# Patient Record
Sex: Male | Born: 1940 | Race: White | Hispanic: No | Marital: Married | Smoking: Never smoker
Health system: Southern US, Community
[De-identification: ages and names within clinical notes are randomized; demographics above are authoritative.]

## PROBLEM LIST (undated history)

## (undated) DIAGNOSIS — I4891 Unspecified atrial fibrillation: Secondary | ICD-10-CM

## (undated) DIAGNOSIS — I472 Ventricular tachycardia, unspecified: Secondary | ICD-10-CM

## (undated) DIAGNOSIS — E039 Hypothyroidism, unspecified: Secondary | ICD-10-CM

## (undated) DIAGNOSIS — I080 Rheumatic disorders of both mitral and aortic valves: Secondary | ICD-10-CM

## (undated) DIAGNOSIS — I428 Other cardiomyopathies: Secondary | ICD-10-CM

## (undated) DIAGNOSIS — Z95810 Presence of automatic (implantable) cardiac defibrillator: Secondary | ICD-10-CM

## (undated) DIAGNOSIS — G061 Intraspinal abscess and granuloma: Secondary | ICD-10-CM

## (undated) DIAGNOSIS — Z952 Presence of prosthetic heart valve: Secondary | ICD-10-CM

## (undated) DIAGNOSIS — I499 Cardiac arrhythmia, unspecified: Secondary | ICD-10-CM

## (undated) DIAGNOSIS — G4730 Sleep apnea, unspecified: Secondary | ICD-10-CM

## (undated) DIAGNOSIS — I509 Heart failure, unspecified: Secondary | ICD-10-CM

## (undated) DIAGNOSIS — F528 Other sexual dysfunction not due to a substance or known physiological condition: Secondary | ICD-10-CM

## (undated) DIAGNOSIS — R55 Syncope and collapse: Secondary | ICD-10-CM

## (undated) HISTORY — DX: Syncope and collapse: R55

## (undated) HISTORY — DX: Other sexual dysfunction not due to a substance or known physiological condition: F52.8

## (undated) HISTORY — DX: Sleep apnea, unspecified: G47.30

## (undated) HISTORY — PX: THYROIDECTOMY: SHX17

## (undated) HISTORY — PX: MITRAL VALVE REPLACEMENT: SHX147

## (undated) HISTORY — PX: OTHER SURGICAL HISTORY: SHX169

## (undated) HISTORY — DX: Intraspinal abscess and granuloma: G06.1

## (undated) HISTORY — DX: Rheumatic disorders of both mitral and aortic valves: I08.0

## (undated) HISTORY — PX: TONSILLECTOMY: SUR1361

## (undated) HISTORY — DX: Unspecified atrial fibrillation: I48.91

## (undated) HISTORY — PX: CARDIAC VALVE REPLACEMENT: SHX585

## (undated) SURGERY — Surgical Case
Anesthesia: *Unknown

---

## 1997-07-26 HISTORY — PX: OTHER SURGICAL HISTORY: SHX169

## 1998-03-28 ENCOUNTER — Ambulatory Visit (HOSPITAL_COMMUNITY): Admission: RE | Admit: 1998-03-28 | Discharge: 1998-03-28 | Payer: Self-pay | Admitting: Internal Medicine

## 1998-03-30 ENCOUNTER — Encounter: Payer: Self-pay | Admitting: Internal Medicine

## 1998-03-30 ENCOUNTER — Inpatient Hospital Stay (HOSPITAL_COMMUNITY): Admission: AD | Admit: 1998-03-30 | Discharge: 1998-04-10 | Payer: Self-pay | Admitting: Internal Medicine

## 1998-04-01 ENCOUNTER — Encounter: Payer: Self-pay | Admitting: Internal Medicine

## 1998-04-02 ENCOUNTER — Encounter: Payer: Self-pay | Admitting: Internal Medicine

## 1998-04-09 ENCOUNTER — Encounter: Payer: Self-pay | Admitting: Internal Medicine

## 1998-06-02 ENCOUNTER — Encounter: Admission: RE | Admit: 1998-06-02 | Discharge: 1998-06-02 | Payer: Self-pay | Admitting: Infectious Diseases

## 1998-06-02 ENCOUNTER — Ambulatory Visit (HOSPITAL_COMMUNITY): Admission: RE | Admit: 1998-06-02 | Discharge: 1998-06-02 | Payer: Self-pay | Admitting: Infectious Diseases

## 1998-06-25 ENCOUNTER — Encounter: Admission: RE | Admit: 1998-06-25 | Discharge: 1998-06-25 | Payer: Self-pay | Admitting: Infectious Diseases

## 1998-07-26 HISTORY — PX: VALVE REPLACEMENT: SUR13

## 1998-07-28 ENCOUNTER — Encounter: Payer: Self-pay | Admitting: Interventional Cardiology

## 1998-07-28 ENCOUNTER — Inpatient Hospital Stay (HOSPITAL_COMMUNITY): Admission: RE | Admit: 1998-07-28 | Discharge: 1998-08-05 | Payer: Self-pay | Admitting: Interventional Cardiology

## 1998-07-29 ENCOUNTER — Encounter: Payer: Self-pay | Admitting: Interventional Cardiology

## 1998-07-31 ENCOUNTER — Encounter: Payer: Self-pay | Admitting: Cardiothoracic Surgery

## 1998-08-01 ENCOUNTER — Encounter: Payer: Self-pay | Admitting: Cardiothoracic Surgery

## 1998-08-03 ENCOUNTER — Encounter: Payer: Self-pay | Admitting: Cardiothoracic Surgery

## 1998-09-05 ENCOUNTER — Encounter: Payer: Self-pay | Admitting: Internal Medicine

## 2002-04-23 ENCOUNTER — Encounter: Admission: RE | Admit: 2002-04-23 | Discharge: 2002-04-23 | Payer: Self-pay | Admitting: Interventional Cardiology

## 2002-04-23 ENCOUNTER — Encounter: Payer: Self-pay | Admitting: Interventional Cardiology

## 2002-04-24 ENCOUNTER — Ambulatory Visit (HOSPITAL_COMMUNITY): Admission: RE | Admit: 2002-04-24 | Discharge: 2002-04-25 | Payer: Self-pay | Admitting: Interventional Cardiology

## 2002-04-24 HISTORY — PX: CARDIAC CATHETERIZATION: SHX172

## 2004-02-28 ENCOUNTER — Encounter: Admission: RE | Admit: 2004-02-28 | Discharge: 2004-02-28 | Payer: Self-pay | Admitting: Internal Medicine

## 2005-05-23 ENCOUNTER — Inpatient Hospital Stay (HOSPITAL_COMMUNITY): Admission: EM | Admit: 2005-05-23 | Discharge: 2005-05-24 | Payer: Self-pay | Admitting: Emergency Medicine

## 2005-05-24 ENCOUNTER — Encounter (INDEPENDENT_AMBULATORY_CARE_PROVIDER_SITE_OTHER): Payer: Self-pay | Admitting: *Deleted

## 2008-05-15 ENCOUNTER — Encounter: Admission: RE | Admit: 2008-05-15 | Discharge: 2008-05-15 | Payer: Self-pay | Admitting: Cardiology

## 2008-05-21 ENCOUNTER — Inpatient Hospital Stay (HOSPITAL_COMMUNITY): Admission: RE | Admit: 2008-05-21 | Discharge: 2008-05-22 | Payer: Self-pay | Admitting: Cardiology

## 2008-11-23 HISTORY — PX: INSERT / REPLACE / REMOVE PACEMAKER: SUR710

## 2008-11-26 ENCOUNTER — Encounter: Payer: Self-pay | Admitting: Internal Medicine

## 2008-11-27 ENCOUNTER — Ambulatory Visit: Payer: Self-pay | Admitting: Cardiovascular Disease

## 2008-11-27 ENCOUNTER — Ambulatory Visit: Payer: Self-pay | Admitting: Internal Medicine

## 2008-11-27 DIAGNOSIS — I482 Chronic atrial fibrillation, unspecified: Secondary | ICD-10-CM | POA: Insufficient documentation

## 2008-11-27 DIAGNOSIS — H9190 Unspecified hearing loss, unspecified ear: Secondary | ICD-10-CM | POA: Insufficient documentation

## 2008-11-27 DIAGNOSIS — F528 Other sexual dysfunction not due to a substance or known physiological condition: Secondary | ICD-10-CM

## 2008-11-27 DIAGNOSIS — R55 Syncope and collapse: Secondary | ICD-10-CM | POA: Insufficient documentation

## 2008-11-27 DIAGNOSIS — G061 Intraspinal abscess and granuloma: Secondary | ICD-10-CM | POA: Insufficient documentation

## 2008-11-27 DIAGNOSIS — G4730 Sleep apnea, unspecified: Secondary | ICD-10-CM | POA: Insufficient documentation

## 2008-11-27 DIAGNOSIS — I4891 Unspecified atrial fibrillation: Secondary | ICD-10-CM | POA: Insufficient documentation

## 2008-11-28 ENCOUNTER — Ambulatory Visit: Payer: Self-pay | Admitting: Internal Medicine

## 2008-11-28 ENCOUNTER — Ambulatory Visit (HOSPITAL_COMMUNITY): Admission: RE | Admit: 2008-11-28 | Discharge: 2008-11-29 | Payer: Self-pay | Admitting: Internal Medicine

## 2008-11-29 ENCOUNTER — Encounter: Payer: Self-pay | Admitting: Internal Medicine

## 2008-12-10 ENCOUNTER — Encounter: Payer: Self-pay | Admitting: Internal Medicine

## 2008-12-11 ENCOUNTER — Ambulatory Visit: Payer: Self-pay

## 2009-03-05 ENCOUNTER — Ambulatory Visit: Payer: Self-pay | Admitting: Internal Medicine

## 2009-04-15 ENCOUNTER — Ambulatory Visit: Payer: Self-pay | Admitting: Internal Medicine

## 2009-04-15 DIAGNOSIS — I5023 Acute on chronic systolic (congestive) heart failure: Secondary | ICD-10-CM | POA: Insufficient documentation

## 2009-04-15 DIAGNOSIS — I5022 Chronic systolic (congestive) heart failure: Secondary | ICD-10-CM

## 2009-04-15 LAB — CONVERTED CEMR LAB
BUN: 21 mg/dL
CO2: 32 meq/L
Calcium: 8.9 mg/dL
Chloride: 106 meq/L
Creatinine, Ser: 1.4 mg/dL
GFR calc non Af Amer: 53.82 mL/min
Glucose, Bld: 89 mg/dL
Potassium: 5 meq/L
Sodium: 141 meq/L

## 2009-05-06 ENCOUNTER — Encounter: Payer: Self-pay | Admitting: Internal Medicine

## 2009-05-12 ENCOUNTER — Encounter: Payer: Self-pay | Admitting: Internal Medicine

## 2009-06-02 ENCOUNTER — Encounter: Payer: Self-pay | Admitting: Internal Medicine

## 2009-06-02 ENCOUNTER — Ambulatory Visit: Payer: Self-pay | Admitting: Internal Medicine

## 2009-06-02 DIAGNOSIS — Z95810 Presence of automatic (implantable) cardiac defibrillator: Secondary | ICD-10-CM | POA: Insufficient documentation

## 2009-08-25 ENCOUNTER — Ambulatory Visit: Payer: Self-pay | Admitting: Internal Medicine

## 2010-01-23 ENCOUNTER — Ambulatory Visit: Payer: Self-pay | Admitting: Internal Medicine

## 2010-06-10 ENCOUNTER — Telehealth: Payer: Self-pay | Admitting: Internal Medicine

## 2010-06-11 ENCOUNTER — Ambulatory Visit: Payer: Self-pay | Admitting: Internal Medicine

## 2010-06-25 ENCOUNTER — Ambulatory Visit: Payer: Self-pay | Admitting: Internal Medicine

## 2010-07-06 ENCOUNTER — Ambulatory Visit: Payer: Self-pay | Admitting: Internal Medicine

## 2010-08-06 ENCOUNTER — Ambulatory Visit
Admission: RE | Admit: 2010-08-06 | Discharge: 2010-08-06 | Payer: Self-pay | Source: Home / Self Care | Attending: Internal Medicine | Admitting: Internal Medicine

## 2010-08-23 LAB — CONVERTED CEMR LAB
BUN: 21 mg/dL (ref 6–23)
Basophils Absolute: 0 10*3/uL (ref 0.0–0.1)
Basophils Relative: 0.1 % (ref 0.0–3.0)
CO2: 31 meq/L (ref 19–32)
Calcium: 8.8 mg/dL (ref 8.4–10.5)
Chloride: 106 meq/L (ref 96–112)
Creatinine, Ser: 1.2 mg/dL (ref 0.4–1.5)
Eosinophils Absolute: 0.2 10*3/uL (ref 0.0–0.7)
Eosinophils Relative: 2.2 % (ref 0.0–5.0)
GFR calc non Af Amer: 64.37 mL/min (ref >60)
Glucose, Bld: 84 mg/dL (ref 70–99)
HCT: 44 % (ref 39.0–52.0)
Hemoglobin: 15.5 g/dL (ref 13.0–17.0)
Lymphocytes Relative: 16.1 % (ref 12.0–46.0)
Lymphs Abs: 1.1 10*3/uL (ref 0.7–4.0)
MCHC: 35.2 g/dL (ref 30.0–36.0)
MCV: 89.5 fL (ref 78.0–100.0)
Monocytes Absolute: 0.8 10*3/uL (ref 0.1–1.0)
Monocytes Relative: 11.3 % (ref 3.0–12.0)
Neutro Abs: 5 10*3/uL (ref 1.4–7.7)
Neutrophils Relative %: 70.3 % (ref 43.0–77.0)
Platelets: 130 10*3/uL — ABNORMAL LOW (ref 150.0–400.0)
Potassium: 4.5 meq/L (ref 3.5–5.1)
Prothrombin Time: 34.4 s — ABNORMAL HIGH (ref 10.9–13.3)
RBC: 4.92 M/uL (ref 4.22–5.81)
RDW: 13.9 % (ref 11.5–14.6)
Sodium: 140 meq/L (ref 135–145)
WBC: 7.1 10*3/uL (ref 4.5–10.5)
aPTT: 40.6 s — ABNORMAL HIGH (ref 21.7–28.8)

## 2010-08-27 NOTE — Procedures (Signed)
Summary: [facility] Device [facility]   Current Medications (verified): 1)  Fish Oil 1000 Mg Caps (Omega-3 Fatty Acids) .... Take 1 By Mouth Once Daily 2)  Folbic 2.5-25-2 Mg Tabs (Fa-Pyridoxine-Cyancobalamin) .Marland Kitchen.. 1 Tab Once Daily 3)  Jantoven 5 Mg Tabs (Warfarin Sodium) .... As Directed By [facility] 4)  Amoxicillin 500 Mg Tabs (Amoxicillin) .... 4 Tablerts Prior To Dental Appt As Needed 5)  Multivitamins  Caps (Multiple Vitamin) .... Take 1 By Mouth Once Occasionally 6)  Amiodarone Hcl 200 Mg Tabs (Amiodarone Hcl) .... Take One Tablet By Mouth Once Daily. 7)  Lisinopril 20 Mg Tabs (Lisinopril) .... Take One Tablet By Mouth Daily 8)  Levothyroxine Sodium 75 Mcg Tabs (Levothyroxine Sodium) .... Take One Tablet By Mouth Once Daily.  Allergies (verified): No Known Drug Allergies   ICD Specifications Following MD:  Lewayne Bunting, MD     Referring MD:  Garnette Scheuermann, MD ICD Vendor:  Medtronic     ICD Model Number:  [phone]     ICD Serial Number:  [id number] ICD DOI:  11/28/2008     ICD Implanting MD:  Lewayne Bunting, MD  Lead 1:    Location: RA     DOI: 11/28/2008     Model #: 3762     Serial #: [id number]     Status: active Lead 2:    Location: RV     DOI: 11/28/2008     Model #: 7371     Serial #: [id number]     Status: active Lead 3:    Location: LV     DOI: 11/28/2008     Model #: 6270     Serial #: [id number]     Status: active  Indications::  NICM; VT   ICD Follow Up ICD Dependent:  No       ICD Device Measurements Configuration: BIPOLAR  Episodes Coumadin:  Yes  Brady Parameters Mode DDDR     Lower Rate Limit:  60     Upper Rate Limit 130 PAV 180     Sensed AV Delay:  150 Rate Response Parameters:  LV-RV64ms  Tachy Zones VF:  222     VT:  OFF     VT1:  171

## 2010-08-27 NOTE — Assessment & Plan Note (Signed)
Summary: per check out/sf   Visit Type:  Follow-up Primary Provider:  Dr Austin [name] cardiology   History of Present Illness: Austin [name] retuirns today for [name].  He is a pleasant 70 yo man with a h/o VT and syncope as well as symptomatic bradycardia and atrial fibrillation.  He is s/p upgrade to a BiV ICD over a year ago. At that time he had removal of his pacing lead and attempted removal of a star-fix LV lead with attempts to remove the Star Fix lead terminated because of concerns of causing serious bleeding.  An ICD and LV lead were then placed and he has been stable.  He has had VT with shocks and he has been on gradually reduced doses of amiodarone.   He denies c/p or sob and his activity is back to his baseline.     He has not had any intercurrent ICD therapies.  The patient has had mild swelling over his ICD incison which began about 4 weeks ago.  No fever, chills, night sweats, tenderness or drainage. When I saw him two weeks ago I recommended a period of watchful waiting and he has been stable. No change in the size of his swelling.  Current Medications (verified): 1)  Fish Oil 1000 Mg Caps (Omega-3 Fatty Acids) .... Take 1 By Mouth Once Daily 2)  Folbic 2.5-25-2 Mg Tabs (Fa-Pyridoxine-Cyancobalamin) .Marland Kitchen.. 1 Tab Once Daily 3)  Jantoven 5 Mg Tabs (Warfarin Sodium) .... As Directed By [facility] 4)  Amoxicillin 500 Mg Tabs (Amoxicillin) .... 4 Tablerts Prior To Dental Appt As Needed 5)  Multivitamins  Caps (Multiple Vitamin) .... Take 1 By Mouth Once Daily 6)  Amiodarone Hcl 200 Mg Tabs (Amiodarone Hcl) .... Take One Tablet By Mouth Once Daily. 7)  Lisinopril 20 Mg Tabs (Lisinopril) .... Take One Tablet By Mouth Daily 8)  Levothyroxine Sodium 75 Mcg Tabs (Levothyroxine Sodium) .... Take One Tablet By Mouth Once Daily.  Allergies (verified): No Known Drug Allergies  Past History:  Past Medical History: Last updated: 11/27/2008 LEFT VENTRICULAR FUNCTION, DECREASED  (ICD-429.2) SLEEP APNEA (ICD-780.57) HYPERCHOLESTEROLEMIA (ICD-272.0) ERECTILE DYSFUNCTION (ICD-302.72) HEARING LOSS (ICD-389.9) SYNCOPE (ICD-780.2) ATRIAL FIBRILLATION, CHRONIC (ICD-427.31) ABSCESS, SPINE, EPIDURAL (ICD-324.1) MITRAL REGURGITATION, SEVERE (ICD-396.3) MITRAL VALVE REPLACEMENT, HX OF (ICD-V15.1)  Past Surgical History: Last updated: 11/27/2008 Mitral valve replacement with  #33  St. Jude Valve 2000 Evacution of epidural lumbar epidural abscess 1999 Thyroidectomy Tonsillectomy Dental implants  Review of Systems  The patient denies syncope, dyspnea on exertion, peripheral edema, and prolonged cough.    Vital Signs:  Patient profile:   70 year old male Height:      74 inches Weight:      204 pounds BMI:     26.29 Pulse rate:   84 / minute BP sitting:   126 / 84  (left arm)  Vitals Entered By: Laurance Flatten CMA (August 06, 2010 4:02 PM)  Physical Exam  General:  Well developed, well nourished, in no acute distress. Head:  normocephalic and atraumatic Eyes:  PERRLA/EOM intact; conjunctiva and lids normal. Mouth:  Teeth, gums and palate normal. Oral mucosa normal. Neck:  Neck supple, no JVD. No masses, thyromegaly or abnormal cervical nodes. Chest Wall:   ICD incision is non-tender with mild fluctuance and no erythema. Lungs:  Clear bilaterally with no wheezes, rales, or rhonchi.  No increased work of breathing. Heart:  RRR with mechanical S1 and normal S2.  No obvious murmurs. Abdomen:  Bowel sounds positive; abdomen soft and  non-tender without masses, organomegaly, or hernias noted. No hepatosplenomegaly. Msk:  Back normal, normal gait. Muscle strength and tone normal. Pulses:  pulses normal in all 4 extremities Extremities:  No clubbing or cyanosis. Neurologic:  Alert and oriented x 3.    ICD Specifications Following MD:  Austin Bunting, MD     Referring MD:  Austin Scheuermann, MD ICD Vendor:  Med[employer]onic     ICD Model Number:  [phone]     ICD Serial Number:   [id number] ICD DOI:  11/28/2008     ICD Implanting MD:  Austin Bunting, MD  Lead 1:    Location: RA     DOI: 11/28/2008     Model #: 1478     Serial #: [id number]     Status: active Lead 2:    Location: RV     DOI: 11/28/2008     Model #: 5784     Serial #: [id number]     Status: active Lead 3:    Location: LV     DOI: 11/28/2008     Model #: 1324     Serial #: [id number]     Status: active  Indications::  NICM; VT   ICD Follow Up ICD Dependent:  No       ICD Device Measurements Configuration: BIPOLAR  Episodes Coumadin:  Yes  Brady Parameters Mode DDDR     Lower Rate Limit:  60     Upper Rate Limit 130 PAV 180     Sensed AV Delay:  150 Rate Response Parameters:  LV-RV44ms  Tachy Zones VF:  222     VT:  OFF     VT1:  171     Impression & Recommendations:  Problem # 1:  AUTOMATIC IMPLANTABLE CARDIAC DEFIBRILLATOR SITU (ICD-V45.02) His pocket site is stable. I have recommended continued watchful waiting. Will see him back in several months. He will call for problems.  Patient Instructions: 1)  Your physician wants you to follow-up in: 6 months with Dr Austin [name] will receive a reminder letter in the mail two months in advance. If you don't receive a letter, please call our office to schedule the follow-up appointment. 2)  Your physician recommends that you continue on your current medications as directed. Please refer to the Current Medication list given to you today.

## 2010-08-27 NOTE — Procedures (Signed)
Summary: df2   Current Medications (verified): 1)  Fish Oil 1000 Mg Caps (Omega-3 Fatty Acids) .... Take 1 By Mouth Once Daily 2)  Folbic 2.5-25-2 Mg Tabs (Fa-Pyridoxine-Cyancobalamin) .Marland Kitchen.. 1 Tab Once Daily 3)  Jantoven 5 Mg Tabs (Warfarin Sodium) .... As Directed By [facility] 4)  Amoxicillin 500 Mg Tabs (Amoxicillin) .... 4 Tablerts Prior To Dental Appt As Needed 5)  Multivitamins  Caps (Multiple Vitamin) .... Take 1 By Mouth Once Occasionally 6)  Amiodarone Hcl 200 Mg Tabs (Amiodarone Hcl) .... Take One Tablet By Mouth Once Daily. 7)  Lisinopril 20 Mg Tabs (Lisinopril) .... Take One Tablet By Mouth Daily 8)  Levothyroxine Sodium 75 Mcg Tabs (Levothyroxine Sodium) .... Take One Tablet By Mouth Once Daily.  Allergies (verified): No Known Drug Allergies   ICD Specifications Following MD:  Lewayne Bunting, MD     Referring MD:  Garnette Scheuermann, MD ICD Vendor:  [employer]     ICD Model Number:  [id number]     ICD Serial Number:  [id number] ICD DOI:  11/28/2008     ICD Implanting MD:  Lewayne Bunting, MD  Lead 1:    Location: RA     DOI: 11/28/2008     Model #: 6962     Serial #: [id number]     Status: active Lead 2:    Location: RV     DOI: 11/28/2008     Model #: 0102     Serial #: [id number]     Status: active Lead 3:    Location: LV     DOI: 11/28/2008     Model #: 3474     Serial #: [id number]     Status: active  Indications::  NICM; VT   ICD Follow Up Battery Voltage:  3.12 V     Charge Time:  8.6 seconds     Underlying rhythm:  SR ICD Dependent:  No       ICD Device Measurements Atrium:  Amplitude: 3.5 mV, Impedance: 551 ohms, Threshold: 1.25 V at 0.40 msec Right Ventricle:  Amplitude: 20.0 mV, Impedance: 627 ohms, Threshold: 0.75 V at 0.40 msec Left Ventricle:  Impedance: 798 ohms, Threshold: 1.00 V at 0.60 msec Configuration: BIPOLAR Shock Impedance: 47/58 ohms   Episodes MS Episodes:  0     Coumadin:  Yes Shock:  0     ATP:  0     Nonsustained:  0     Atrial Therapies:  0 Atrial  Pacing:  80.6%     Ventricular Pacing:  99.8%  Brady Parameters Mode DDDR     Lower Rate Limit:  60     Upper Rate Limit 130 PAV 180     Sensed AV Delay:  150 Rate Response Parameters:  LV-RV67ms  Tachy Zones VF:  222     VT:  OFF     VT1:  171     Next Cardiology Appt Due:  08/13/2010 Tech Comments:  PT HAVING LIGHTHEADEDNESS W/ACTIVITY.  NORMAL DEVICE FUNCTION.  NO EPISODES RECORDED SINCE LAST CHECK. OPTIVOL INCREASE SINCE 06-02-10--PT HAVING NO PROBLEMS W/SOB OR WEIGHT GAIN.   TURNED ON 1:1 SVT. ROV 08-13-10 @ 1545 W/GT. Vella Kohler  June 11, 2010 10:57 AM

## 2010-08-27 NOTE — Assessment & Plan Note (Signed)
Summary: 2 WKS/OK PER MILDRED/D.MILLER   Visit Type:  2wk follow up Primary Provider:  Dr osborne-Dr Katrinka Blazing cardiology  CC:  no complaints.  History of Present Illness: Austin [name] retuirns today for followup.  He is a pleasant 70 yo man with a h/o VT and syncope as well as symptomatic bradycardia and atrial fibrillation.  He is s/p upgrade to a BiV ICD over a year ago. At that time he had removal of his pacing lead and attempted removal of a star-fix LV lead with attempts to remove the Star Fix lead terminated because of concerns of causing serious bleeding.  An ICD and LV lead were then placed and he has been stable.  He has had VT with shocks and he has been on gradually reduced doses of amiodarone.   He denies c/p or sob and his activity is back to his baseline.     He has not had any intercurrent ICD therapies.  The patient has had mild swelling over his ICD incison which began about 2 weeks ago.  No fever, chills, night sweats, tenderness or drainage.  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) 3)  Ventricular Tachycardia  (ICD-427.1) 4)  Left Ventricular Function, Decreased  (ICD-429.2) 5)  Sleep Apnea  (ICD-780.57) 6)  Hypercholesterolemia  (ICD-272.0) 7)  Erectile Dysfunction  (ICD-302.72) 8)  Hearing Loss  (ICD-389.9) 9)  Syncope  (ICD-780.2) 10)  Atrial Fibrillation, Chronic  (ICD-427.31) 11)  Abscess, Spine, Epidural  (ICD-324.1) 12)  Mitral Regurgitation, Severe  (ICD-396.3) 13)  Mitral Valve Replacement, Hx of  (ICD-V15.1)  Current Problems (verified): 1)  Automatic Implantable Cardiac Defibrillator Situ  (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 Take 1 By Mouth Once Daily 2)  Folbic 2.5-25-2 Mg Tabs (Fa-Pyridoxine-Cyancobalamin) .Marland Kitchen.. 1 Tab Once Daily 3)  Jantoven 5 Mg Tabs (Warfarin Sodium) .... As Directed By [facility] 4)  Amoxicillin 500 Mg Tabs (Amoxicillin) .... 4 Tablerts Prior To Dental Appt As Needed 5)  Multivitamins  Caps (Multiple Vitamin) .... Take 1 By Mouth Once Daily 6)  Amiodarone Hcl 200 Mg Tabs (Amiodarone Hcl) .... Take One Tablet By Mouth Once Daily. 7)  Lisinopril 20 Mg Tabs (Lisinopril) .... Take One Tablet By Mouth Daily 8)  Levothyroxine Sodium 75 Mcg Tabs (Levothyroxine Sodium) .... Take One Tablet By Mouth Once Daily.  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 Jude Valve 2000 Evacution of epidural lumbar epidural abscess 1999 Thyroidectomy Tonsillectomy Dental implants  Risk Factors: Smoking Status: never (11/27/2008)  Review of Systems  The patient denies fever,  weight loss, syncope, dyspnea on exertion, peripheral edema, and headaches.    Vital Signs:  Patient profile:   70 year old male Height:      74 inches Weight:      203.50 pounds BMI:     26.22 Pulse  rate:   83 / minute BP sitting:   135 / 80  (left arm) Cuff size:   regular  Vitals Entered By: Caralee Ates CMA (July 06, 2010 4:22 PM)  Physical Exam  General:  Well developed, well nourished, in no acute distress. Head:  normocephalic and atraumatic Eyes:  PERRLA/EOM intact; conjunctiva and lids normal. Mouth:  Teeth, gums and palate normal. Oral mucosa normal. Neck:  Neck supple, no JVD. No masses, thyromegaly or abnormal cervical nodes. Chest Wall:   ICD incision is non-tender with mild fluctuance and no erythema. Lungs:  Clear bilaterally with no wheezes, rales, or rhonchi.  No increased work of breathing. Heart:  RRR with mechanical S1 and normal S2.  No obvious murmurs. Abdomen:  Bowel sounds positive; abdomen soft and non-tender without masses, organomegaly, or hernias noted. No hepatosplenomegaly. Msk:  Back normal, normal gait. Muscle strength and tone normal. Pulses:  pulses normal in all 4 extremities Extremities:  No clubbing or cyanosis. Neurologic:  Alert and oriented x 3.    ICD Specifications Following MD:  Lewayne Bunting, MD     Referring MD:  Garnette Scheuermann, MD ICD Vendor:  [employer]     ICD Model Number:  [phone]     ICD Serial Number:  [id number] ICD DOI:  11/28/2008     ICD Implanting MD:  Lewayne Bunting, MD  Lead 1:    Location: RA     DOI: 11/28/2008     Model #: 2595     Serial #: [id number]     Status: active Lead 2:    Location: RV     DOI: 11/28/2008     Model #: 9518     Serial #: [id number]     Status: active Lead 3:    Location: LV     DOI: 11/28/2008     Model #: 1601     Serial #: [id number]     Status: active  Indications::  NICM; VT   ICD Follow Up ICD Dependent:  No       ICD Device Measurements Configuration: BIPOLAR  Episodes Coumadin:  Yes  Brady Parameters Mode DDDR     Lower Rate Limit:  60     Upper Rate Limit 130 PAV 180     Sensed AV Delay:  150 Rate Response Parameters:  LV-RV74ms  Tachy Zones VF:  222     VT:  OFF     VT1:   171     MD Comments:  Normal device function when check 2 weeks ago.  Impression & Recommendations:  Problem # 1:  AUTOMATIC IMPLANTABLE CARDIAC DEFIBRILLATOR SITU (ICD-V45.02) His device is working well but I am concerned about an indolent ICD infection.  I have asked him to take clindamycin for 2 weeks and followup with  me in 3-4 weeks.  If he develops any signs of infection and I have gone over these with him, then he is to call our office.  Problem # 2:  VENTRICULAR TACHYCARDIA (ICD-427.1) His VT has been well controlled. He will continue low dose amiodarone. His updated medication list for this problem includes:    Jantoven 5 Mg Tabs (Warfarin sodium) .Marland Kitchen... As directed by [facility] lab  Amiodarone Hcl 200 Mg Tabs (Amiodarone hcl) .Marland Kitchen... Take one tablet by mouth once daily.    Lisinopril 20 Mg Tabs (Lisinopril) .Marland Kitchen... Take one tablet by mouth daily  Problem # 3:  CHRONIC SYSTOLIC HEART FAILURE (ICD-428.22) His symptoms are currently class 1.  Will continue meds as below. His updated medication list for this problem includes:    Jantoven 5 Mg Tabs (Warfarin sodium) .Marland Kitchen... As directed by [facility] lab    Amiodarone Hcl 200 Mg Tabs (Amiodarone hcl) .Marland Kitchen... Take one tablet by mouth once daily.    Lisinopril 20 Mg Tabs (Lisinopril) .Marland Kitchen... Take one tablet by mouth daily  Patient Instructions: 1)  Your physician recommends that you schedule a follow-up appointment in: 3-4 weeks 2)  Your physician has recommended you make the following change in your medication: Clindamycin 450mg  twice a day. Call us if you have diarrhea.  Prescriptions: CLINDAMYCIN HCL 150 MG CAPS (CLINDAMYCIN HCL) q12h x 14 d  #28 x 0   Entered by:   Claris Gladden RN   Authorized by:   Laren Boom, MD, [facility]   Signed by:   Claris Gladden RN on 07/06/2010   Method used:   Electronically to        [facility]  [phone]* (retail)       [address] [place], [place]  [address]       Ph: [phone] or  [phone]       Fax: [phone]   RxID:   [id number] CLINDAMYCIN HCL 300 MG CAPS (CLINDAMYCIN HCL) q12h [id number]  #28 x 0   Entered by:   Claris Gladden RN   Authorized by:   Laren Boom, MD, [facility]   Signed by:   Claris Gladden RN on 07/06/2010   Method used:   Electronically to        [facility]  [phone]* (retail)       [address] [place], [place]  [address]       Ph: [phone] or [phone]       Fax: [phone]   RxID:   [id number]

## 2010-08-27 NOTE — Progress Notes (Signed)
Summary: lightheadness  Phone Note From Other Clinic Call back at Home Phone [phone]   Caller: Patient Caller: Nurse Summary of Call: per Bonita Quin Dr Katrinka Blazing pt having some lightheadness when he is playing tennis. Doesnt know if he needs to be seen. please call the pt Initial call taken by: Edman Circle,  June 10, 2010 9:09 AM  Follow-up for Phone Call        spoke with pt's wife he is going to come in tomorrow and have his device interrogated at 10:00  Pt aware Dennis Bast, RN, BSN  June 10, 2010 3:39 PM

## 2010-08-27 NOTE — Cardiovascular Report (Signed)
Summary: Office Visit   Office Visit   Imported By: Roderic Ovens 07/06/2010 14:17:01  _____________________________________________________________________  External Attachment:    Type:   Image     Comment:   External Document

## 2010-08-27 NOTE — Procedures (Signed)
Summary: site check per paula   Allergies: No Known Drug Allergies   ICD Specifications Following MD:  Lewayne Bunting, MD     Referring MD:  Garnette Scheuermann, MD ICD Vendor:  [employer]     ICD Model Number:  [phone]     ICD Serial Number:  [id number] ICD DOI:  11/28/2008     ICD Implanting MD:  Lewayne Bunting, MD  Lead 1:    Location: RA     DOI: 11/28/2008     Model #: 3086     Serial #: [id number]     Status: active Lead 2:    Location: RV     DOI: 11/28/2008     Model #: 2841     Serial #: [id number]     Status: active Lead 3:    Location: LV     DOI: 11/28/2008     Model #: 2536     Serial #: [id number]     Status: active  Indications::  NICM; VT   ICD Follow Up ICD Dependent:  No       ICD Device Measurements Configuration: BIPOLAR  Episodes Coumadin:  Yes  Brady Parameters Mode DDDR     Lower Rate Limit:  60     Upper Rate Limit 130 PAV 180     Sensed AV Delay:  150 Rate Response Parameters:  LV-RV43ms  Tachy Zones VF:  222     VT:  OFF     VT1:  171     Tech Comments:  Mr. Godbee was seen today for swelling at his device site that was noticed 2 weeks ago.  Dr. Juanda Chance also looked at his wound and he will return to see Dr. Ladona Ridgel. Altha Harm, LPN  July 02, 2010 8:01 AM

## 2010-08-27 NOTE — Assessment & Plan Note (Signed)
Summary: per check out/sf   Primary Provider:  Dr osborne-Dr Katrinka Blazing cardiology   History of Present Illness: [name] retuirns today for followup.  He is a pleasant 70 yo man with a h/o VT and syncope as well as symptomatic bradycardia and atrial fibrillation.  He is s/p upgrade to a BiV ICD several months ago.  I saw him several weeks ago and he had experienced an ICD shock for VT.  His device was reprogrammed.  His amiodarone dose was increased to 400 mg daily.  In the interim,  he has been stable.  He denies c/p or sob and his activity is back to his baseline.  He has recently seen the dermatologist and had a Basal cell skin CA removed.  He denies palpitations.  Current Medications (verified): 1)  Fish Oil 1000 Mg Caps (Omega-3 Fatty Acids) .... Take 1 By Mouth Once Daily 2)  Folbic 2.5-25-2 Mg Tabs (Fa-Pyridoxine-Cyancobalamin) .Marland Kitchen.. 1 Tab Once Daily 3)  Jantoven 5 Mg Tabs (Warfarin Sodium) .... As Directed By [facility] Lab 4)  Amoxicillin 500 Mg Tabs (Amoxicillin) .... 4 Tablerts Prior To Dental Appt As Needed 5)  Multivitamins  Caps (Multiple Vitamin) .... Take 1 By Mouth Once Occasionally 6)  Amiodarone Hcl 200 Mg Tabs (Amiodarone Hcl) .... Two By Mouth Daily 7)  Lisinopril 20 Mg Tabs (Lisinopril) .... Take One Tablet By Mouth Daily 8)  Levothyroxine Sodium 75 Mcg Tabs (Levothyroxine Sodium) .... Take One Tablet By Mouth Once Daily.  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  Jude Valve 2000 Evacution of epidural lumbar epidural abscess 1999 Thyroidectomy Tonsillectomy Dental implants  Review of Systems  The patient denies chest pain, syncope, dyspnea on exertion, and peripheral edema.    Vital Signs:  Patient profile:   70 year old male Height:      74 inches Weight:      211 pounds BMI:     27.19 Pulse rate:   64 / minute BP sitting:   118 / 72  (right arm)  Vitals Entered By: Laurance Flatten CMA (August 25, 2009 2:30 PM)  Physical Exam  General:  Well developed, well nourished, in no acute distress. Head:  normocephalic and atraumatic Eyes:  PERRLA/EOM intact; conjunctiva and lids normal. Mouth:  Teeth, gums and palate normal. Oral mucosa normal. Neck:  Neck supple, no JVD. No masses, thyromegaly or abnormal cervical nodes. Chest Wall:  Well healed ICD incision. Lungs:  Clear bilaterally with no wheezes, rales, or rhonchi.  No increased work of breathing. Heart:  RRR with mechanical S1 and normal S2.  No obvious murmurs. Abdomen:  Bowel sounds positive; abdomen soft and non-tender without masses, organomegaly, or hernias noted. No hepatosplenomegaly. Msk:  Back normal, normal gait. Muscle strength and tone normal. Pulses:  pulses normal in all 4 extremities Extremities:  No clubbing or cyanosis. Neurologic:  Alert and oriented x 3.     ICD Specifications Following MD:  Lewayne Bunting, MD     Referring MD:  Garnette Scheuermann, MD ICD Vendor:  Medtronic     ICD Model Number:  [phone]     ICD Serial Number:  [id number] ICD DOI:  11/28/2008     ICD Implanting MD:  Lewayne Bunting, MD  Lead 1:    Location: RA     DOI: 11/28/2008     Model #: 4540     Serial #: [id number]     Status: active Lead 2:    Location: RV     DOI: 11/28/2008     Model #: 9562     Serial #: [id number]     Status: active Lead 3:    Location: LV     DOI: 11/28/2008     Model #: 6962     Serial #: [id number]     Status: active  Indications::  NICM; VT   ICD Follow Up Remote Check?   No Battery Voltage:  3.17 V     Charge Time:  8.3 seconds     Underlying rhythm:  SB ICD Dependent:  No       ICD Device Measurements Atrium:  Amplitude: 3.4 mV, Impedance: 551 ohms, Threshold: 1.0 V at 0.4 msec Right Ventricle:  Amplitude: 20 mV, Impedance: 627 ohms, Threshold: 0.75 V at 0.4 msec Left Ventricle:  Impedance: 836 ohms, Threshold: 1.0 V at 0.4 msec Configuration: BIPOLAR Shock Impedance: 47/58 ohms   Episodes MS Episodes:  0     Coumadin:  Yes Shock:  0     ATP:  0     Nonsustained:  0     Atrial Pacing:  85%     Ventricular Pacing:  94.7%  Brady Parameters Mode DDDR     Lower Rate Limit:  60     Upper Rate Limit 130 PAV 180     Sensed AV Delay:  150 Rate Response Parameters:  LV-RV5ms  Tachy Zones VF:  222     VT:  OFF     VT1:  171     Next Remote Date:  11/25/2009     Next Cardiology Appt Due:  07/27/2010 Tech Comments:  Normal device function.  Histagrams appropriate.  Optivol flat.  No changes made today.  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  ROV 12 months GT. Gypsy Balsam RN BSN  August 25, 2009 2:47 PM  MD Comments:  Agree with above.  Impression & Recommendations:  Problem # 1:  AUTOMATIC IMPLANTABLE CARDIAC DEFIBRILLATOR SITU (ICD-V45.02) His device is working normally.  Will followup in several months.  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  He has been intolerant to beta blockers previously. The following medications were removed from the medication list:    Micardis 40 Mg Tabs (Telmisartan) .Marland Kitchen... 1 tab daily His updated medication list for this problem includes:    Jantoven 5 Mg Tabs (Warfarin sodium) .Marland Kitchen... As directed by [facility] lab    Amiodarone Hcl 200 Mg Tabs (Amiodarone hcl) .Marland Kitchen... Take one tablet by mouth once daily.    Lisinopril 20 Mg Tabs (Lisinopril) .Marland Kitchen... Take one tablet by mouth daily  Problem # 3:  VENTRICULAR TACHYCARDIA (ICD-427.1) He has had no episodes of VT since prior clinic visit. His updated  medication list for this problem includes:    Jantoven 5 Mg Tabs (Warfarin sodium) .Marland Kitchen... As directed by [facility] lab    Amiodarone Hcl 200 Mg Tabs (Amiodarone hcl) .Marland Kitchen... Take one tablet by mouth once daily.    Lisinopril 20 Mg Tabs (Lisinopril) .Marland Kitchen... Take one tablet by mouth daily  Problem # 4:  ATRIAL FIBRILLATION, CHRONIC (ICD-427.31) He has maintained NSR since amiodarone.  Will followup.  I have  decreased his dose of amiodarone today to 200 mg daily. His updated medication list for this problem includes:    Jantoven 5 Mg Tabs (Warfarin sodium) .Marland Kitchen... As directed by [facility] lab    Amiodarone Hcl 200 Mg Tabs (Amiodarone hcl) .Marland Kitchen... Take one tablet by mouth once daily.  Patient Instructions: 1)  May start driving 03/29/8545. 2)  Your physician has recommended you make the following change in your medication: Amiodarone 200mg  once daily 3)  Your physician recommends that you schedule a follow-up appointment in: 6 months

## 2010-08-27 NOTE — Assessment & Plan Note (Signed)
Summary: rov/ gd   Primary Provider:  Dr osborne-Dr Katrinka Blazing cardiology   History of Present Illness: Austin [name] retuirns today for followup.  He is a pleasant 70 yo man with a h/o VT and syncope as well as symptomatic bradycardia and atrial fibrillation.  He is s/p upgrade to a BiV ICD several months ago.  I saw him several weeks ago and he had experienced an ICD shock for VT.  His device was reprogrammed.  His amiodarone dose was increased to 400 mg daily.  In the interim,  he has been stable.  He denies c/p or sob and his activity is back to his baseline.   Since I saw him last he has torn his hamstring but not had any arrythmias.  He has not had any intercurrent ICD therapies.  Current Medications (verified): 1)  Fish Oil 1000 Mg Caps (Omega-3 Fatty Acids) .... Take 1 By Mouth Once Daily 2)  Folbic 2.5-25-2 Mg Tabs (Fa-Pyridoxine-Cyancobalamin) .Marland Kitchen.. 1 Tab Once Daily 3)  Jantoven 5 Mg Tabs (Warfarin Sodium) .... As Directed By [facility] Lab 4)  Amoxicillin 500 Mg Tabs (Amoxicillin) .... 4 Tablerts Prior To Dental Appt As Needed 5)  Multivitamins  Caps (Multiple Vitamin) .... Take 1 By Mouth Once Occasionally 6)  Amiodarone Hcl 200 Mg Tabs (Amiodarone Hcl) .... Take One Tablet By Mouth Once Daily. 7)  Lisinopril 20 Mg Tabs (Lisinopril) .... Take One Tablet By Mouth Daily 8)  Levothyroxine Sodium 75 Mcg Tabs (Levothyroxine Sodium) .... Take One Tablet By Mouth Once Daily.  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 Jude Valve 2000 Evacution of epidural lumbar epidural abscess 1999 Thyroidectomy Tonsillectomy Dental implants  Review of Systems       The patient complains of peripheral edema.  The patient denies chest pain, syncope, and dyspnea on exertion.    Vital Signs:  Patient profile:   70 year old male Weight:      210 pounds Pulse rate:   60 / minute Pulse rhythm:   regular BP sitting:   100 / 60  (left arm) Cuff size:   regular  Vitals Entered By: Deliah Goody, RN (January 23, 2010 2:51 PM)  Physical Exam  General:  Well developed, well nourished, in no acute distress. Head:  normocephalic and atraumatic Eyes:  PERRLA/EOM intact; conjunctiva and lids normal. Neck:  Neck supple, no JVD. No masses, thyromegaly or abnormal cervical nodes. Chest Wall:  Well healed ICD incision. Lungs:  Clear bilaterally with no wheezes, rales, or rhonchi.  No increased work of breathing. Heart:  RRR with mechanical S1 and normal S2.  No obvious murmurs. Abdomen:  Bowel sounds positive; abdomen soft and non-tender without masses, organomegaly, or hernias noted. No hepatosplenomegaly. Msk:  Back normal, normal gait. Muscle strength and tone normal. Pulses:  pulses normal in all 4 extremities Extremities:  swollen right leg. Neurologic:  Alert and oriented x 3.    ICD Specifications Following MD:  Austin Bunting, MD     Referring MD:  Austin Scheuermann, MD ICD Vendor:  Medtronic     ICD Model Number:  [id number]     ICD Serial Number:  [id number] ICD DOI:  11/28/2008     ICD Implanting MD:  Austin Bunting, MD  Lead 1:    Location: RA     DOI: 11/28/2008     Model #: 3220     Serial #: [id number]     Status: active Lead 2:    Location: RV     DOI: 11/28/2008     Model #: [id number]     Serial #: [id number]     Status: active Lead 3:    Location: LV     DOI: 11/28/2008     Model #: 3710     Serial #: [id number]     Status: active  Indications::  NICM; VT   ICD Follow Up ICD Dependent:  No        ICD Device Measurements Configuration: BIPOLAR  Episodes Coumadin:  Yes  Brady Parameters Mode DDDR     Lower Rate Limit:  60     Upper Rate Limit 130 PAV 180     Sensed AV Delay:  150 Rate Response Parameters:  LV-RV30ms  Tachy Zones VF:  222     VT:  OFF     VT1:  171     MD Comments:  Normal device function.  See scanned data.  Impression & Recommendations:  Problem # 1:  AUTOMATIC IMPLANTABLE CARDIAC DEFIBRILLATOR SITU (ICD-V45.02) His device is working. Will recheck in several months.  Problem # 2:  CHRONIC SYSTOLIC HEART FAILURE (ICD-428.22) He appears to be class 1 at present.   His updated medication list for this problem includes:    Jantoven 5 Mg Tabs (Warfarin sodium) .Marland Kitchen... As directed by [facility] lab    Amiodarone Hcl 200 Mg Tabs (Amiodarone hcl) .Marland Kitchen... Take one tablet by mouth once daily.    Lisinopril 20 Mg Tabs (Lisinopril) .Marland Kitchen... Take one tablet by mouth daily  Problem # 3:  VENTRICULAR TACHYCARDIA (ICD-427.1) On amiodarone, his VT has remained quiet. His updated medication list for this problem includes:    Jantoven 5 Mg Tabs (Warfarin sodium) .Marland Kitchen... As directed by [facility] lab    Amiodarone Hcl 200 Mg Tabs (Amiodarone hcl) .Marland Kitchen... Take one tablet by mouth once daily.    Lisinopril 20 Mg Tabs (Lisinopril) .Marland Kitchen... Take one tablet by mouth daily  Problem # 4:  ATRIAL FIBRILLATION, CHRONIC (ICD-427.31) He has maintained NSR on amiodarone.  Continue meds as below. His updated medication list for this problem includes:    Jantoven 5 Mg Tabs (Warfarin sodium) .Marland Kitchen... As directed by [facility] lab    Amiodarone Hcl 200 Mg Tabs (Amiodarone hcl) .Marland Kitchen... Take one tablet by mouth once daily.  Patient Instructions: 1)  Your physician recommends that you schedule a follow-up appointment in: 6 MONTHS

## 2010-11-03 LAB — PROTIME-INR: Prothrombin Time: 29.6 seconds — ABNORMAL HIGH (ref 11.6–15.2)

## 2010-12-08 NOTE — Op Note (Signed)
Austin, [name] NO.:  [id number]   MEDICAL RECORD NO.:  [id number]          PATIENT TYPE:  INP   LOCATION:  3729                         FACILITY:  [facility]   PHYSICIAN:  Francisca December, M.D.  DATE OF BIRTH:  1941/01/22   DATE OF PROCEDURE:  05/21/2008  DATE OF DISCHARGE:  05/22/2008                               OPERATIVE REPORT   PROCEDURES PERFORMED:  1. Insertion of biventricular pacemaker.  2. Coronary sinus venogram.  3. Left subclavian venogram.   INDICATIONS:  [name] is a pleasant 70 year old man who has  had a mitral valve replacement in the past and has been anticoagulated.  He is in chronic atrial fibrillation and now has very slow ventricular  response to atrial fibrillation.  He has had multiple episodes of  syncope and presyncope in the past.  He is therefore brought to the  Catheterization Laboratory at this time for insertion of a biventricular  pacemaker.  A left ventricular lead will be placed due to the presence  of borderline left ventricular systolic function in the range of 40-45%  EF.  He is likely to be chronic RV pacing, which has been shown to  decreased longevity and worsened left ventricular function.   PROCEDURE NOTE:  The patient was brought to the Cardiac Catheterization  Laboratory in a fasting state.  The left prepectoral region was prepped  and draped in the usual sterile fashion.  Local anesthesia was obtained  with infiltration of 1% lidocaine with epinephrine throughout the left  prepectoral region.  A 6-7 cm incision was then made in the  deltopectoral groove and this was carried down by sharp dissection and  electrocautery to the prepectoral fascia.  There, a plane was lifted and  a pocket formed inferiorly and medially using blunt dissection and  electrocautery.  The pocket was then packed with 1% kanamycin-soaked  gauze.  A left subclavian venogram was then performed with a peripheral  injection of 20  mL of Omnipaque.  Digital cineangiogram was obtained and  road mapped to guide future left subclavian puncture.  The subclavian  venogram did demonstrate the vein to be widely patent and coursing in a  normal fashion over the anterior surface of the first rib and beneath  the middle third of the clavicle.  There was no evidence for persistence  of the left superior vena cava.   The left subclavian vein was then punctured twice using an 18-gauge thin-  wall needle through which was passed a 0.038-inch tight J guidewire.  Over the initial guidewire, a 7-French tearaway sheath and dilator were  advanced.  The dilator and wire were removed and the ventricular lead  was advanced to the level of the right atrium.  The sheath was torn  away.  Using standard technique and fluoroscopic landmarks, the lead was  manipulated into the right ventricular apex.  This was an active  fixation lead and the screw was advanced as appropriate.  Excellent  pacing parameters were obtained as noted below.  The lead was then  sutured into place using 3 separate 0 silk  ligatures.  Over the  remaining guidewire, a 9-French tearaway sheath and dilator were  advanced.  Through the sheath, a 9-French Medtronic MB2 guiding catheter  was advanced over a dilator and a wire.  The dilator was removed and  using standard technique and fluoroscopic landmarks, the guiding  catheter was manipulated into the coronary sinus.  Retrograde coronary  sinus angiograms were then performed with the balloon occlusion catheter  in the RAO, AP, and left lateral projections.  A lateral vein of  adequate size was identified, however, the very proximal portion was  quite tortuous.  After extensive manipulations and the use of a right-  angled support catheter, a  whisper wire was advanced into the distal  portion of the vein.  It was unsuccessful, however, passing the lead  beyond the very proximal portion of the vein origin.  Using a  standard  over-the-wire balloon catheter, the wire was exchanged for a Mailman  extra support.  This was positioned in the distal vein and again I was  unable to advance the lead even with adequate guiding catheter backup.  Therefore, the guidewire was removed as well as the lead, and the guide  catheter was withdrawn until a posterolateral vein was sub-selected and  identified by angiography.  A Luge coronary wire was advanced into this  vein and over this, the Medtronic bipolar 4-French left ventricular  pacing catheter was advanced.  It was positioned adequately with  excellent pacing parameters as would be noted below.  However, after  removal of the guide wire and with cardiac motion, lead displaced back  so the secondary curve was in the coronary sinus itself.  I felt this  was unsatisfactory.  Therefore, this lead was removed and again a  Medtronic MB2 guiding catheter was advanced in the posterolateral vein  selected.  At this point, the Medtronic active fixation left ventricular  lead unipolar was advanced into the vein and deployed with the tines  positions with adequate stability of the lead.  The entire device was  sutured into place after removing the guiding catheter by the slit  technique.  Excellent pacing parameters were obtained as well.  The  kanamycin-soaked gauze was then removed from the pocket and the pocket  was copiously irrigated using 1% kanamycin solution.  The leads were  then attached to the pacing generator, carefully identifying each by its  serial number and placing each into the appropriate receptacle.  Each  lead was tightened into place and tested for security.  The atrial lead  port was capped.  The leads were then well beneath the pacing generator  and the generator was placed in the pocket.  A 0 silk anchoring suture  was applied.  The pocket was then closed using 2-0 Vicryl in a running  fashion for the subcutaneous layer.  The skin was approximated  using 4-0  Vicryl in a running subcuticular fashion.  Steri-Strips and sterile  dressing were applied.  The patient was transferred to the recovery area  in stable condition.   EQUIPMENT DATA:  The pulse generator is a Medtronic, model number I078015,  serial number [id number].  The right ventricular lead is a Medtronic,  model number [id number], serial number [id number].  The left ventricular lead  ultimately implanted was a model number Medtronic, model number 4195,  serial number [id number].   PACING DATA:  The right ventricular lead detected at 6.0 mV R-wave.  The  pacing threshold was 0.6 volts at 0.5 milliseconds  pulse width.  The  impedance was 710 ohms resulting in a current at capture threshold of  1.0 MA.  The left ventricular lead detected a 5.3 mV R-wave.  The pacing  threshold was 0.7 volts at 0.5 milliseconds pulse width.  The impedance  was 732 ohms resulting in a current at capture threshold of 1.1 MA.  It  should be noted that each lead was tested for diaphragmatic and chest  wall pacing at 10 volts and none was found.      Francisca December, M.D.  Electronically Signed     JHE/MEDQ  D:  05/22/2008  T:  05/23/2008  Job:  [id number]   cc:   Theressa Millard, M.D.  Lyn Records, M.D.

## 2010-12-08 NOTE — Discharge Summary (Signed)
NAMECHEY, CHO NO.:  [id number]   MEDICAL RECORD NO.:  [id number]          PATIENT TYPE:  INP   LOCATION:  2503                         FACILITY:  [facility]   PHYSICIAN:  Doylene Canning. Ladona Ridgel, MD    DATE OF BIRTH:  [birth year]   DATE OF ADMISSION:  11/28/2008  DATE OF DISCHARGE:  11/29/2008                               DISCHARGE SUMMARY   ADDENDUM   Protime and INR on Nov 29, 2008, were 24.6 and 2.1.  Coumadin dosing per  pharmacy will be 5 mg p.o. daily.      Jarrett Ables, PAC      Doylene Canning. Ladona Ridgel, MD  Electronically Signed    MS/MEDQ  D:  11/29/2008  T:  11/30/2008  Job:  [id number]
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
## 2010-12-09 ENCOUNTER — Other Ambulatory Visit: Payer: Self-pay | Admitting: *Deleted

## 2010-12-09 MED ORDER — AMIODARONE HCL 200 MG PO TABS: 200.0000 mg | ORAL_TABLET | Freq: Every day | ORAL | Status: DC

## 2010-12-11 NOTE — Cardiovascular Report (Signed)
   NAMETRESTON, COKER NO.:  [id number]   MEDICAL RECORD NO.:  [id number]                   PATIENT TYPE:  OIB   LOCATION:  2899                                 FACILITY:  [facility]   PHYSICIAN:  Lesleigh Noe, M.D.            DATE OF BIRTH:  1940-12-29   DATE OF PROCEDURE:  04/24/2002  DATE OF DISCHARGE:                              CARDIAC CATHETERIZATION   INDICATIONS FOR PROCEDURE:  Recent syncope, exercise-induced  supraventricular tachycardia on treadmill testing. 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   PROCEDURE PERFORMED:  1. Left heart catheterization.  2. Selective coronary angiogram.  3. Left ventriculography was not performed. He is known to have an EF of 45-     55% by recent echocardiograms.   DESCRIPTION OF PROCEDURE:  A 6 French sheath was inserted into the right  femoral artery using the modified Seldinger technique. A 6 French A2  multipurpose catheter was used for hemodynamic recordings and selective  right coronary angiography. A #4 6 French left Judkins catheter was used for  left coronary angiography. The patient tolerated the procedure without  complications.   RESULTS:  I:  HEMODYNAMIC DATA:  Aortic pressure 135/70.   II:  LEFT VENTRICULOGRAPHY:  Not performed.   III:  SELECTIVE CORONARY ANGIOGRAPHY:  a.  Left main coronary artery:  Widely patent.  b.  Left anterior descending coronary:  LAD is gives one large branching  diagonal. The LAD is large and free of any significant obstruction. The LAD  reaches the left ventricular apex.  c.  Circumflex artery:  The circumflex coronary artery is large, gives  origin to one dominant obtuse marginal branch that bifurcates. No  significant obstruction is noted.  d.  Right coronary:  The right coronary artery is dominant giving large PDA  and two left ventricular branches.  No  significant abnormalities are noted.   CONCLUSION:  1. Essentially normal coronary arteries.  2. Normal aortic pressure.  3. Left ventriculography not performed.                                                  Lesleigh Noe, M.D.    HWS/MEDQ  D:  04/24/2002  T:  04/26/2002  Job:  [id number]

## 2010-12-11 NOTE — Discharge Summary (Signed)
NAMEJARRED, PURTEE NO.:  [id number]   MEDICAL RECORD NO.:  [id number]          PATIENT TYPE:  INP   LOCATION:  3729                         FACILITY:  [facility]   PHYSICIAN:  Francisca December, M.D.  DATE OF BIRTH:  Mar 11, 1941   DATE OF ADMISSION:  05/21/2008  DATE OF DISCHARGE:  05/22/2008                               DISCHARGE SUMMARY   DISCHARGE DIAGNOSES:  1. Cardiomyopathy, status post biventricular pacemaker implantation on      May 21, 2008.  2. Atrial fibrillation.  3. Chronic bradycardia.  4. Long-term Coumadin use.  5. History of mitral valve replacement with a St. Jude mechanical      valve, 2000.  6. Epidural abscess, 1999.  7. History of syncope.  8. Hearing loss.  9. Erectile dysfunction.  10.Hypercholesterolemia.  11.Sleep apnea.   HOSPITAL COURSE:  Mr. Difatta is a 70 year old male patient who was  admitted to [facility] for BiV implantation after seeing Dr. Amil Amen  in the office earlier in the month.  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  He has been regulated on Jantoven  maintaining an INR in the 3-3.5 range.  He recently saw Dr. Katrinka Blazing with  complaints of transient dizziness and weakness.  Dr. Katrinka Blazing has  documented heart rate in the 40s as a frequent finding.  He was referred  to Dr. Amil Amen for consideration of pacemaker insertion.   In addition to atrial fibrillation, he has documented Mobitz type 2 AV  block and this was symptomatic.  He has a borderline low cardiomyopathy  that is probably related to previous mitral valve disease and subsequent  MVR.  Dr. Amil Amen was concerned that chronic RV pacing will exacerbate  the cardiomyopathy.  Therefore, he was considering placement of a BiV  pacemaker.   On May 21, 2008, the patient was admitted for insertion of a BiV  pacemaker.  The surgery went well.  The patient remained in the hospital  overnight and was discharged to home.  Please note  that a Medtronic  device was utilized.   Lab studies during the patient's hospital stay include protime which was  22.5 and INR 1.9.   DISCHARGE MEDICATIONS:  1. Folic acid 1 mg daily.  2. Simvastatin 20 mg a day.  3. Jantoven 10 mg for 4 days and 5 mg for 3 days.   The patient is to have a PT/INR checked on May 23, 2008.  We did  write him a prescription for Vicodin 5/500 one to two tablets every 6  hours if needed for pain.   He was given an instruction sheet regarding activity and wound care.  He  is to follow up to see Dr. Laureen Abrahams, nurse practitioner, on  June 03, 2008, at 9:30 a.m.      Guy Franco, P.A.      Francisca December, M.D.  Electronically Signed    LB/MEDQ  D:  06/21/2008  T:  06/21/2008  Job:  [id number]   cc:   Francisca December, M.D.

## 2010-12-11 NOTE — H&P (Signed)
Austin [name], [name] NO.:  [id number]   MEDICAL RECORD NO.:  [id number]          PATIENT TYPE:  EMS   LOCATION:  MAJO                         FACILITY:  [facility]   PHYSICIAN:  Bevelyn Buckles. [place], M.D.DATE OF BIRTH:  [birth year]   DATE OF ADMISSION:  05/22/2005  DATE OF DISCHARGE:                                HISTORY & PHYSICAL   NEUROLOGY STROKE ADMISSION NOTE   REQUESTING PHYSICIAN:  Dr. Jennette Kettle   REASON FOR CONSULT:  Stroke/TIA.   HISTORY OF PRESENT ILLNESS:  Austin [name] is a 70 year old Caucasian male with  past medical history of mitral valve replacement from endocarditis, who is  currently on Coumadin, who presents with acute onset of dizziness, blurred  vision/double vision, nonsteadiness on his feet.  Patient also felt like he  had numbness in his left hand.  His double vision was primarily looking  straight ahead and to the left.  Wife did notice that on primary gaze, the  left eye was deviated to the left.  This was also confirmed by ER physician,  who stated patient also had nystagmus.  Most of his symptoms lasted about an  hour to two hours and slowly resolved over time including his double vision  which resolved last.  He denied any other symptoms of headaches, weakness,  speech changes, swallowing problems, chewing problems, or loss of  consciousness.   PAST MEDICAL HISTORY:  Mitral valve replacement in 2000 secondary to  endocarditis and questionable arrhythmias.   MEDICATIONS:  Coumadin and Toprol-XL.   ALLERGIES:  THE PATIENT HAS NO KNOWN DRUG ALLERGIES.   FAMILY HISTORY:  Unremarkable.   SOCIAL HISTORY:  Patient apparently lives with his wife.  Denies any  tobacco, alcohol or drug use.   REVIEW OF SYSTEMS:  Positive for visual disturbance, unsteadiness/dizziness,  and lower back pain.  Review of systems negative as per history of present  illness and greater than any other organ systems.   PHYSICAL EXAMINATION:  VITALS:  Temperature is  97.1, blood pressure is  153/84, pulse is 57, respirations 20, O2 saturation is 97%.  HEENT:  Normocephalic, atraumatic.  Extraocular muscles are intact.  Pupils  are equal, round and reactive to light.  NECK:  Supple.  No carotid bruit.  HEART:  Regular.  LUNGS:  Clear.  ABDOMEN:  Soft and nontender.  EXTREMITIES:  Show no edema.  Good pulses.  NEUROLOGICAL EXAMINATION:  Patient is alert and oriented x3.  Language is  fluent.  Memory and knowledge are within normal limits.  Cranial nerves II-  XII are grossly intact.  Motor examination shows 5/5 strength and normal  tone in all four extremities.  No drift is noted.  Sensory examination is  within normal limits to light touch and pinprick throughout.  Reflexes are  1+ throughout and symmetric.  Toes are neutral bilaterally.  Cerebellar  function is within normal limits, finger-to-nose and rapid alternating  movements.  Gait is not assessed secondary to safety.   LABORATORIES:  WBC is 10, hemoglobin 14.3, hematocrit 43.2, platelets  183,000.  Sodium was 137, potassium is 4.2, chloride is 107, CO2  is 25, BUN  is 25, creatinine is 1.3, glucose 95, INR is 2.5, PT is 26.8, and PTT is 36.  CK-MB is 9.6 and troponin is less than 0.05.  Patient did have a CT of the  head in the emergency room that showed some mild atrophy, but no acute  abnormalities were seen.   IMPRESSION:  This is a 70 year old with acute onset of blurred/double  vision, dizziness, unsteadiness and nystagmus.  Concern with his history of  mitral valve replacement on Coumadin and his INR yesterday was 2.4 and the  acute of symptoms, the patient possibly could have had a posterior  circulation transient ischemic attack or small stroke.  We will admit the  patient to the stroke service and obtain a magnetic resonance  imaging/magnetic resonance angiography of the brain, carotid Dopplers and 2-  D echo.  We will continue his current medications for now and follow his  INR.   We will check lipids and homocysteine level, get a physical  therapy/occupational therapy consult.           ______________________________  Bevelyn Buckles. Nash Shearer, M.D.     DRC/MEDQ  D:  05/23/2005  T:  05/23/2005  Job:  [id number]

## 2010-12-11 NOTE — Discharge Summary (Signed)
NAME:  Austin [name], Austin [name]              ACCOUNT NO.:  [id number]   MEDICAL RECORD NO.:  [id number]          PATIENT TYPE:  INP   LOCATION:  4731                         FACILITY:  [facility]   PHYSICIAN:  Pramod P. Pearlean Brownie, MD    DATE OF BIRTH:  Jan 28, 1941   DATE OF ADMISSION:  05/22/2005  DATE OF DISCHARGE:  05/24/2005                                 DISCHARGE SUMMARY   ADMISSION DIAGNOSIS:  Transient ischemic attack.   DISCHARGE DIAGNOSES:  1.  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  2.  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  3.  Hyperlipidemia, newly diagnosed.  4.  Hyperhomocysteinemia, newly diagnosed.   HOSPITAL COURSE:  Kindly see Dr. Fredda Hammed admission H&P for details of  presentation. [name] is a 70 year old Caucasian male who presented with  acute onset of dizziness, blurred vision, double vision and unsteady gait  lasting for four hours. He also had some numbness in the index finger in the  left hand. The symptoms resolved after coming to the emergency room.  Admission CT scan of head was unremarkable. He was kept on telemetry  monitoring overnight. He remained stable. His INR on admission was 2.5. MRI  scan of the brain obtained the next day revealed no acute infarct. 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 Carotid ultrasound  showed no significant extracranial stenosis. MRA of the brain revealed no  significant intracranial stenosis. 2-D echocardiogram showed good preserved  valvular function of the prosthetic mitral valve. Ejection fraction was on  the lower limit of normal at 50-55% but the study was not adequate for  evaluation of wall motion abnormalities. The total cholesterol was  borderline at 203. LDL was elevated at 120. ___________ were normal.  Homocystine was elevated 15.5. CBC and blood chemistries were normal.   The patient remained stable  at throughout the hospital stay. The patient was  instructed to increase the Coumadin dosage to 10 milligrams every day to  target for a higher INR of 3 to 3.5. He was started on Zocor for his  elevated lipids and Foltx for his elevated homocystine. He was discharged  home in stable condition with instruction to follow-up with his  cardiologist, Dr. Verdis Prime, in a few weeks and his primary physician, Dr.  Theressa Millard, also in a few weeks. He was instructed to have his blood  drawn for PT/INR determination on May 27, 2005, Thursday in Dr. Michaelle Copas  office and get future directions from there.   DISCHARGE MEDICATIONS:  1.  Coumadin 10 milligrams every day.  2.  Foltx 1 tablet daily.  3.  Zocor 20 milligrams daily.  4.  Toprol XL 50 milligrams daily.           ______________________________  Sunny Schlein. Pearlean Brownie, MD    PPS/MEDQ  D:  05/24/2005  T:  05/24/2005  Job:  [id number]

## 2010-12-30 ENCOUNTER — Encounter: Payer: Self-pay | Admitting: Internal Medicine

## 2011-02-02 ENCOUNTER — Encounter: Payer: Self-pay | Admitting: Internal Medicine

## 2011-02-02 ENCOUNTER — Ambulatory Visit (INDEPENDENT_AMBULATORY_CARE_PROVIDER_SITE_OTHER): Payer: Self-pay | Admitting: Internal Medicine

## 2011-02-02 DIAGNOSIS — I5022 Chronic systolic (congestive) heart failure: Secondary | ICD-10-CM

## 2011-02-02 DIAGNOSIS — Z95810 Presence of automatic (implantable) cardiac defibrillator: Secondary | ICD-10-CM

## 2011-02-02 DIAGNOSIS — I4891 Unspecified atrial fibrillation: Secondary | ICD-10-CM

## 2011-02-02 DIAGNOSIS — I472 Ventricular tachycardia: Secondary | ICD-10-CM

## 2011-02-02 NOTE — Assessment & Plan Note (Signed)
His device is working normally. Will recheck in several months. 

## 2011-02-02 NOTE — Patient Instructions (Signed)
Your physician wants you to follow-up in: 12 months with Dr Taylor You will receive a reminder letter in the mail two months in advance. If you don't receive a letter, please call our office to schedule the follow-up appointment.  Remote monitoring is used to monitor your Pacemaker of ICD from home. This monitoring reduces the number of office visits required to check your device to one time per year. It allows us to keep an eye on the functioning of your device to ensure it is working properly. You are scheduled for a device check from home on 05/06/2011. You may send your transmission at any time that day. If you have a wireless device, the transmission will be sent automatically. After your physician reviews your transmission, you will receive a postcard with your next transmission date.   

## 2011-02-02 NOTE — Assessment & Plan Note (Signed)
His symptoms remain class I. Continue his current medical therapy and maintain a low-sodium diet.

## 2011-02-02 NOTE — Progress Notes (Signed)
HPI Austin [name] returns today for followup. He is a pleasant 70 yo man with a DCM, s/p MVR, VT, Afib, s/p BiV ICD implant. Previously he had a [employer] Star Fix lead placed which was not working well. When he developed VT with syncope, he was upgraded to a BiV ICD and a new LV lead placed. Unfortunately his Star Fix lead could not be removed. He has done well except that he developed swelling around his pocket 9 months ago. This has remained stable and he has not had any fever/chills/night sweats/ or worsening swelling or pain. In addition, he has developed gum disease and dental infection and is on chronic anti-biotic therapy with Doxycycline. He has received no intercurrent ICD shocks. No Known Allergies   Current Outpatient Prescriptions  Medication Sig Dispense Refill  . amiodarone (PACERONE) 200 MG tablet Take 1 tablet (200 mg total) by mouth daily.  30 tablet  9  . amoxicillin (AMOXIL) 500 MG tablet Take 500 mg by mouth 2 (two) times daily.        Austin [name] Kitchen doxycycline (DORYX) 100 MG DR capsule Take 100 mg by mouth daily.        . folic acid (FOLVITE) 1 MG tablet Take 1 mg by mouth daily.        . Levothyroxine Sodium 75 MCG CAPS Take by mouth daily.        Austin [name] Kitchen lisinopril (PRINIVIL,ZESTRIL) 20 MG tablet Take 20 mg by mouth daily.        . Multiple Vitamins-Minerals (MULTIVITAL) tablet Take 1 tablet by mouth daily.        . Omega-3 Fatty Acids (FISH OIL) 1000 MG CAPS Take by mouth daily.        Austin [name] Kitchen warfarin (JANTOVEN) 5 MG tablet Take 5 mg by mouth daily.           Past Medical History  Diagnosis Date  . Unspecified cardiovascular disease   . Unspecified sleep apnea   . Pure hypercholesterolemia   . Psychosexual dysfunction with inhibited sexual excitement   . Conductive hearing loss   . Syncope and collapse   . Atrial fibrillation   . Intraspinal abscess   . Mitral valve insufficiency and aortic valve insufficiency   . Personal history of surgery to heart and great vessels, presenting hazards  to health     ROS:   All systems reviewed and negative except as noted in the HPI.   Past Surgical History  Procedure Date  . Mitral valve replacement     w #[facility]  . Valve replacement 2000  . Evacution of epidural lumbar epidural abscess 1999  . Thyroidectomy   . Tonsillectomy   . Dental implants      No family history on file.   History   Social History  . Marital Status: Married    Spouse Name: N/A    Number of Children: N/A  . Years of Education: N/A   Occupational History  . Not on file.   Social History Main Topics  . Smoking status: Never Smoker   . Smokeless tobacco: Not on file  . Alcohol Use: Yes  . Drug Use: Not on file  . Sexually Active: Not on file   Other Topics Concern  . Not on file   Social History Narrative  . No narrative on file     BP 98/62  Pulse 72  Ht 6\' 2"  (1.88 m)  Wt 199 lb (90.266 kg)  BMI 25.55 kg/m2  Physical Exam:  Well appearing NAD HEENT: Unremarkable Neck:  No JVD, no thyromegally Lymphatics:  No adenopathy Back:  No CVA tenderness Lungs:  Clear. Minimal swelling at the ICD insertion site. No erythema, tenderness, or pain. HEART:  Regular rate rhythm, no murmurs, no rubs, no clicks. Mechanical S1. Abd:  Flat, positive bowel sounds, no organomegally, no rebound, no guarding Ext:  2 plus pulses, no edema, no cyanosis, no clubbing. Left leg is bandaged at the calf level Skin:  No rashes no nodules Neuro:  CN II through XII intact, motor grossly intact  DEVICE  Normal device function.  See PaceArt for details.   Assess/Plan:

## 2011-02-02 NOTE — Assessment & Plan Note (Signed)
His ventricular arrhythmias remain well-controlled. He will continue amiodarone for now.

## 2011-04-26 LAB — PROTIME-INR: INR: 1.9 — ABNORMAL HIGH

## 2011-05-06 ENCOUNTER — Encounter: Payer: Medicare Other | Admitting: *Deleted

## 2011-05-10 ENCOUNTER — Encounter: Payer: Self-pay | Admitting: *Deleted

## 2011-05-14 ENCOUNTER — Ambulatory Visit (INDEPENDENT_AMBULATORY_CARE_PROVIDER_SITE_OTHER): Payer: Medicare Other | Admitting: *Deleted

## 2011-05-14 ENCOUNTER — Encounter: Payer: Self-pay | Admitting: Internal Medicine

## 2011-05-14 ENCOUNTER — Other Ambulatory Visit: Payer: Self-pay | Admitting: Internal Medicine

## 2011-05-14 DIAGNOSIS — I472 Ventricular tachycardia, unspecified: Secondary | ICD-10-CM

## 2011-05-14 DIAGNOSIS — Z95810 Presence of automatic (implantable) cardiac defibrillator: Secondary | ICD-10-CM

## 2011-05-14 DIAGNOSIS — I5022 Chronic systolic (congestive) heart failure: Secondary | ICD-10-CM

## 2011-05-14 DIAGNOSIS — I4891 Unspecified atrial fibrillation: Secondary | ICD-10-CM

## 2011-05-23 LAB — REMOTE ICD DEVICE
AL IMPEDENCE ICD: 532 Ohm
BAMS-0001: 170 {beats}/min
BATTERY VOLTAGE: 3.0801 V
BRDY-0002LV: 60 {beats}/min
BRDY-0003LV: 130 {beats}/min
BRDY-0004LV: 120 {beats}/min
CHARGE TIME: 9.259 s
FVT: 0
LV LEAD IMPEDENCE ICD: 893 Ohm
RV LEAD IMPEDENCE ICD: 627 Ohm
TZAT-0001ATACH: 1
TZAT-0001SLOWVT: 1
TZAT-0001SLOWVT: 2
TZAT-0002ATACH: NEGATIVE
TZAT-0002ATACH: NEGATIVE
TZAT-0004SLOWVT: 8
TZAT-0005SLOWVT: 84 pct
TZAT-0005SLOWVT: 91 pct
TZAT-0011FASTVT: 10 ms
TZAT-0011SLOWVT: 10 ms
TZAT-0012ATACH: 150 ms
TZAT-0012ATACH: 150 ms
TZAT-0012FASTVT: 200 ms
TZAT-0018ATACH: NEGATIVE
TZAT-0018ATACH: NEGATIVE
TZAT-0018FASTVT: NEGATIVE
TZAT-0019ATACH: 6 V
TZAT-0019SLOWVT: 8 V
TZAT-0019SLOWVT: 8 V
TZAT-0020FASTVT: 1.5 ms
TZAT-0020SLOWVT: 1.5 ms
TZON-0003FASTVT: 250 ms
TZON-0004SLOWVT: 20
TZST-0001ATACH: 4
TZST-0001ATACH: 5
TZST-0001FASTVT: 3
TZST-0001FASTVT: 6
TZST-0001SLOWVT: 3
TZST-0001SLOWVT: 4
TZST-0001SLOWVT: 5
TZST-0002ATACH: NEGATIVE
TZST-0003FASTVT: 35 J

## 2011-05-27 NOTE — Progress Notes (Signed)
icd remote check w/icm  

## 2011-05-28 ENCOUNTER — Telehealth: Payer: Self-pay | Admitting: Internal Medicine

## 2011-05-28 NOTE — Telephone Encounter (Signed)
New message:  Has a burn area around the pacer sight x 3-4 weeks.  No pain, or swelling.  Please call and advise.

## 2011-05-28 NOTE — Telephone Encounter (Signed)
11/2--PT c/o burn over pacer site --pacer was placed in 2010--no other symptoms--no recent MRI'S or CT'S--spoke with kristin in [facility]acer clinic and she advised pt come in Monday at 9am--pt. Agrees--nt

## 2011-05-31 ENCOUNTER — Telehealth: Payer: Self-pay | Admitting: Internal Medicine

## 2011-05-31 ENCOUNTER — Ambulatory Visit (INDEPENDENT_AMBULATORY_CARE_PROVIDER_SITE_OTHER): Payer: Medicare Other | Admitting: *Deleted

## 2011-05-31 DIAGNOSIS — R0989 Other specified symptoms and signs involving the circulatory and respiratory systems: Secondary | ICD-10-CM

## 2011-05-31 DIAGNOSIS — I428 Other cardiomyopathies: Secondary | ICD-10-CM

## 2011-05-31 LAB — ICD DEVICE OBSERVATION

## 2011-05-31 MED ORDER — CLINDAMYCIN HCL 300 MG PO CAPS: 300.0000 mg | ORAL_CAPSULE | Freq: Three times a day (TID) | ORAL | Status: DC

## 2011-05-31 NOTE — Telephone Encounter (Signed)
New message-pt was here this morning and he thinks maybe he should be on antibiotics until his appt with dr taylor on nov 16

## 2011-05-31 NOTE — Telephone Encounter (Signed)
Dr Johney Frame looked at it and put him on Dr Lubertha Basque schedule next week  This area has been there for 3 weeks

## 2011-05-31 NOTE — Progress Notes (Signed)
Wound check only

## 2011-05-31 NOTE — Telephone Encounter (Signed)
Dr Johney Frame spoke with Dr Ladona Ridgel and we are going to call in Clindamycin  300mg  tid until he sees Dr Ladona Ridgel next week

## 2011-06-11 ENCOUNTER — Encounter: Payer: Self-pay | Admitting: Internal Medicine

## 2011-06-11 ENCOUNTER — Ambulatory Visit (INDEPENDENT_AMBULATORY_CARE_PROVIDER_SITE_OTHER): Payer: Medicare Other | Admitting: Internal Medicine

## 2011-06-11 ENCOUNTER — Telehealth: Payer: Self-pay | Admitting: Internal Medicine

## 2011-06-11 DIAGNOSIS — I472 Ventricular tachycardia: Secondary | ICD-10-CM

## 2011-06-11 DIAGNOSIS — Z95810 Presence of automatic (implantable) cardiac defibrillator: Secondary | ICD-10-CM

## 2011-06-11 MED ORDER — DOXYCYCLINE HYCLATE 100 MG PO CAPS: 100.0000 mg | ORAL_CAPSULE | Freq: Two times a day (BID) | ORAL | Status: DC

## 2011-06-11 NOTE — Patient Instructions (Signed)
Will schedule to have ICD extracted on 06/28/11

## 2011-06-11 NOTE — Progress Notes (Signed)
HPI Austin [name] returns today for follow up. He is a pleasant 70 yo man with a h/o VT, PAF, both controlled with amiodarone, and history of mitral valve endocarditis status post replacement. The patient has undergone initial body pacemaker insertion and then developed ventricular tachycardia and had upgrade to a biventricular ICD with new left ventricular lead insertion. The patient has done relatively well for over 2 years. He developed swelling and erythema at his ICD insertion site. He has had no fevers and no chills and no night sweats. He feels well. He was given antibiotic therapy with clindamycin. He has seen some reduction in erythema. He denies any recent ICD shocks. No Known Allergies   Current Outpatient Prescriptions  Medication Sig Dispense Refill  . amiodarone (PACERONE) 200 MG tablet Take 1 tablet (200 mg total) by mouth daily.  30 tablet  9  . amoxicillin (AMOXIL) 500 MG tablet Take 500 mg by mouth 2 (two) times daily.        . doxycycline (DORYX) 100 MG DR capsule Take 100 mg by mouth daily.        . folic acid (FOLVITE) 1 MG tablet Take 1 mg by mouth daily.        . Levothyroxine Sodium 75 MCG CAPS Take by mouth daily.        . lisinopril (PRINIVIL,ZESTRIL) 20 MG tablet Take 20 mg by mouth daily.        . Multiple Vitamins-Minerals (MULTIVITAL) tablet Take 1 tablet by mouth daily.        . Omega-3 Fatty Acids (FISH OIL) 1000 MG CAPS Take by mouth daily.        . warfarin (JANTOVEN) 5 MG tablet Take 5 mg by mouth daily.        . clindamycin (CLEOCIN) 300 MG capsule Take 1 capsule (300 mg total) by mouth 3 (three) times daily.  40 capsule  0  . doxycycline (VIBRAMYCIN) 100 MG capsule Take 1 capsule (100 mg total) by mouth 2 (two) times daily.  60 capsule  3     Past Medical History  Diagnosis Date  . Unspecified cardiovascular disease   . Unspecified sleep apnea   . Pure hypercholesterolemia   . Psychosexual dysfunction with inhibited sexual excitement   . Conductive hearing  loss   . Syncope and collapse   . Atrial fibrillation   . Intraspinal abscess   . Mitral valve insufficiency and aortic valve insufficiency   . Personal history of surgery to heart and great vessels, presenting hazards to health     ROS:   All systems reviewed and negative except as noted in the HPI.   Past Surgical History  Procedure Date  . Mitral valve replacement     w #[facility]  . Valve replacement 2000  . Evacution of epidural lumbar epidural abscess 1999  . Thyroidectomy   . Tonsillectomy   . Dental implants      No family history on file.   History   Social History  . Marital Status: Married    Spouse Name: N/A    Number of Children: N/A  . Years of Education: N/A   Occupational History  . Not on file.   Social History Main Topics  . Smoking status: Never Smoker   . Smokeless tobacco: Not on file  . Alcohol Use: Yes  . Drug Use: Not on file  . Sexually Active: Not on file   Other Topics Concern  . Not on file     Social History Narrative  . No narrative on file     BP 104/62  Pulse 65  Ht 6' 1" (1.854 m)  Wt 91.227 kg (201 lb 1.9 oz)  BMI 26.53 kg/m2  Physical Exam:  Well appearing NAD HEENT: Unremarkable Neck:  No JVD, no thyromegally Lymphatics:  No adenopathy Back:  No CVA tenderness Lungs:  Clear with no wheezes, rales, or rhonchi. ICD incision is swollen and erythematous. It is not tender. HEART:  Regular rate rhythm, no murmurs, no rubs, no clicks Abd:  soft, positive bowel sounds, no organomegally, no rebound, no guarding Ext:  2 plus pulses, no edema, no cyanosis, no clubbing Skin:  No rashes no nodules Neuro:  CN II through XII intact, motor grossly intact  DEVICE  Device interrogation demonstrates that he has underlying sinus rhythm with one to one AV conduction.   Assess/Plan:   

## 2011-06-11 NOTE — Assessment & Plan Note (Signed)
The patient's device has evidence of infection. I've discussed the treatment options with the patient. I do not think antibiotic therapy alone will cure this infection. I have recommended removal of the entire system. Unfortunately the patient has had hemodynamically important ventricular tachycardia in the past. In addition, the patient has a Star Fix left ventricular lead which was capped previously it is not working well. This lead has been known to be very difficult to extract. The risk, goals, and if it's, and expectations of ICD system removal have been discussed with the patient and he wishes to proceed. This will be scheduled. He will have to come off of his Coumadin and take Lovenox as a bridge with his mechanical mitral valve.

## 2011-06-11 NOTE — Telephone Encounter (Signed)
New Problem:  Called wanting you to call back

## 2011-06-11 NOTE — Assessment & Plan Note (Signed)
With his history of ventricular tachycardia in the past resulting in syncope, I have recommended that the patient obtain a life vest to be worn after extraction of his ICD system.

## 2011-06-11 NOTE — Telephone Encounter (Signed)
Spoke with patient and I'm trying to schedule his extract

## 2011-06-14 ENCOUNTER — Encounter (HOSPITAL_COMMUNITY): Payer: Self-pay | Admitting: Pharmacy Technician

## 2011-06-14 ENCOUNTER — Encounter: Payer: Self-pay | Admitting: *Deleted

## 2011-06-16 ENCOUNTER — Encounter: Payer: Self-pay | Admitting: *Deleted

## 2011-06-16 ENCOUNTER — Telehealth: Payer: Self-pay | Admitting: Internal Medicine

## 2011-06-16 NOTE — Telephone Encounter (Signed)
Pt calling re upcoming surgery

## 2011-06-18 ENCOUNTER — Ambulatory Visit (INDEPENDENT_AMBULATORY_CARE_PROVIDER_SITE_OTHER): Payer: Medicare Other | Admitting: *Deleted

## 2011-06-18 DIAGNOSIS — E7800 Pure hypercholesterolemia, unspecified: Secondary | ICD-10-CM

## 2011-06-18 DIAGNOSIS — I080 Rheumatic disorders of both mitral and aortic valves: Secondary | ICD-10-CM

## 2011-06-18 DIAGNOSIS — I472 Ventricular tachycardia: Secondary | ICD-10-CM

## 2011-06-18 LAB — CBC WITH DIFFERENTIAL/PLATELET
Basophils Absolute: 0 10*3/uL (ref 0.0–0.1)
Eosinophils Relative: 2.4 % (ref 0.0–5.0)
HCT: 42.9 % (ref 39.0–52.0)
Hemoglobin: 14.2 g/dL (ref 13.0–17.0)
Lymphocytes Relative: 12.9 % (ref 12.0–46.0)
Lymphs Abs: 1 10*3/uL (ref 0.7–4.0)
Monocytes Absolute: 0.7 10*3/uL (ref 0.1–1.0)
Monocytes Relative: 9.5 % (ref 3.0–12.0)
Platelets: 197 10*3/uL (ref 150.0–400.0)
RBC: 4.75 Mil/uL (ref 4.22–5.81)
RDW: 15.7 % — ABNORMAL HIGH (ref 11.5–14.6)
WBC: 7.8 10*3/uL (ref 4.5–10.5)

## 2011-06-18 LAB — BASIC METABOLIC PANEL
Glucose, Bld: 90 mg/dL (ref 70–99)
Potassium: 4.9 mEq/L (ref 3.5–5.1)
Sodium: 139 mEq/L (ref 135–145)

## 2011-06-18 LAB — PROTIME-INR: INR: 2.2 ratio — ABNORMAL HIGH (ref 0.8–1.0)

## 2011-06-23 ENCOUNTER — Other Ambulatory Visit: Payer: Self-pay | Admitting: *Deleted

## 2011-06-23 DIAGNOSIS — I428 Other cardiomyopathies: Secondary | ICD-10-CM

## 2011-06-24 MED ORDER — GENTAMICIN SULFATE 40 MG/ML IJ SOLN
80.0000 mg | INTRAMUSCULAR | Status: AC
  Filled 2011-06-24: qty 2

## 2011-06-24 MED ORDER — VANCOMYCIN HCL IN DEXTROSE 1-5 GM/200ML-% IV SOLN
1000.0000 mg | INTRAVENOUS | Status: AC
  Administered 2011-06-25: 1000 mg via INTRAVENOUS
  Filled 2011-06-24: qty 200

## 2011-06-25 ENCOUNTER — Inpatient Hospital Stay (HOSPITAL_COMMUNITY): Payer: Medicare Other | Admitting: Certified Registered Nurse Anesthetist

## 2011-06-25 ENCOUNTER — Inpatient Hospital Stay (HOSPITAL_COMMUNITY)
Admission: RE | Admit: 2011-06-25 | Discharge: 2011-06-28 | DRG: 261 | Disposition: A | Discharge status: Home / Self Care | Payer: Medicare Other | Source: Ambulatory Visit | Attending: Internal Medicine | Admitting: Internal Medicine

## 2011-06-25 ENCOUNTER — Inpatient Hospital Stay (HOSPITAL_COMMUNITY): Payer: Medicare Other

## 2011-06-25 ENCOUNTER — Encounter (HOSPITAL_COMMUNITY): Payer: Self-pay | Admitting: Certified Registered Nurse Anesthetist

## 2011-06-25 ENCOUNTER — Other Ambulatory Visit: Payer: Self-pay

## 2011-06-25 ENCOUNTER — Encounter (HOSPITAL_COMMUNITY): Payer: Self-pay | Admitting: *Deleted

## 2011-06-25 ENCOUNTER — Encounter (HOSPITAL_COMMUNITY)
Admission: RE | Disposition: A | Discharge status: Home / Self Care | Payer: Self-pay | Source: Ambulatory Visit | Attending: Internal Medicine

## 2011-06-25 DIAGNOSIS — T827XXA Infection and inflammatory reaction due to other cardiac and vascular devices, implants and grafts, initial encounter: Secondary | ICD-10-CM

## 2011-06-25 DIAGNOSIS — I5022 Chronic systolic (congestive) heart failure: Secondary | ICD-10-CM | POA: Diagnosis present

## 2011-06-25 DIAGNOSIS — Y9210 Unspecified residential institution as the place of occurrence of the external cause: Secondary | ICD-10-CM | POA: Diagnosis not present

## 2011-06-25 DIAGNOSIS — E7800 Pure hypercholesterolemia, unspecified: Secondary | ICD-10-CM | POA: Diagnosis present

## 2011-06-25 DIAGNOSIS — I472 Ventricular tachycardia, unspecified: Secondary | ICD-10-CM | POA: Diagnosis present

## 2011-06-25 DIAGNOSIS — H902 Conductive hearing loss, unspecified: Secondary | ICD-10-CM | POA: Diagnosis present

## 2011-06-25 DIAGNOSIS — I4891 Unspecified atrial fibrillation: Secondary | ICD-10-CM | POA: Insufficient documentation

## 2011-06-25 DIAGNOSIS — Z95810 Presence of automatic (implantable) cardiac defibrillator: Secondary | ICD-10-CM

## 2011-06-25 DIAGNOSIS — Y92009 Unspecified place in unspecified non-institutional (private) residence as the place of occurrence of the external cause: Secondary | ICD-10-CM

## 2011-06-25 DIAGNOSIS — IMO0002 Reserved for concepts with insufficient information to code with codable children: Secondary | ICD-10-CM | POA: Diagnosis not present

## 2011-06-25 DIAGNOSIS — Z954 Presence of other heart-valve replacement: Secondary | ICD-10-CM

## 2011-06-25 DIAGNOSIS — I482 Chronic atrial fibrillation, unspecified: Secondary | ICD-10-CM | POA: Insufficient documentation

## 2011-06-25 DIAGNOSIS — Y831 Surgical operation with implant of artificial internal device as the cause of abnormal reaction of the patient, or of later complication, without mention of misadventure at the time of the procedure: Secondary | ICD-10-CM | POA: Diagnosis present

## 2011-06-25 DIAGNOSIS — Z79899 Other long term (current) drug therapy: Secondary | ICD-10-CM

## 2011-06-25 DIAGNOSIS — Z7901 Long term (current) use of anticoagulants: Secondary | ICD-10-CM

## 2011-06-25 DIAGNOSIS — G4730 Sleep apnea, unspecified: Secondary | ICD-10-CM | POA: Diagnosis present

## 2011-06-25 DIAGNOSIS — I428 Other cardiomyopathies: Secondary | ICD-10-CM | POA: Diagnosis present

## 2011-06-25 DIAGNOSIS — I5023 Acute on chronic systolic (congestive) heart failure: Secondary | ICD-10-CM | POA: Insufficient documentation

## 2011-06-25 DIAGNOSIS — I509 Heart failure, unspecified: Secondary | ICD-10-CM | POA: Diagnosis present

## 2011-06-25 DIAGNOSIS — Y838 Other surgical procedures as the cause of abnormal reaction of the patient, or of later complication, without mention of misadventure at the time of the procedure: Secondary | ICD-10-CM | POA: Diagnosis not present

## 2011-06-25 DIAGNOSIS — I4729 Other ventricular tachycardia: Secondary | ICD-10-CM | POA: Diagnosis present

## 2011-06-25 HISTORY — DX: Ventricular tachycardia, unspecified: I47.20

## 2011-06-25 HISTORY — DX: Ventricular tachycardia: I47.2

## 2011-06-25 LAB — PROTIME-INR
INR: 1.39 (ref 0.00–1.49)
Prothrombin Time: 17.3 seconds — ABNORMAL HIGH (ref 11.6–15.2)

## 2011-06-25 LAB — SURGICAL PCR SCREEN
MRSA, PCR: NEGATIVE
Staphylococcus aureus: NEGATIVE

## 2011-06-25 SURGERY — ICD GENERATOR CHANGE
Anesthesia: LOCAL

## 2011-06-25 SURGERY — REMOVAL, ELECTRODE LEAD, ICD
Anesthesia: General | Site: Chest | Wound class: Dirty or Infected

## 2011-06-25 MED ORDER — HYDROMORPHONE HCL PF 1 MG/ML IJ SOLN
0.2500 mg | INTRAMUSCULAR | Status: DC | PRN
  Administered 2011-06-25 (×2): 0.5 mg via INTRAVENOUS

## 2011-06-25 MED ORDER — LIDOCAINE HCL (PF) 1 % IJ SOLN
INTRAMUSCULAR | Status: DC | PRN
  Administered 2011-06-25: 30 mL

## 2011-06-25 MED ORDER — ACETAMINOPHEN 500 MG PO TABS
1000.0000 mg | ORAL_TABLET | Freq: Four times a day (QID) | ORAL | Status: AC
  Administered 2011-06-26 – 2011-06-27 (×4): 1000 mg via ORAL
  Filled 2011-06-25 (×4): qty 2

## 2011-06-25 MED ORDER — LACTATED RINGERS IV SOLN
INTRAVENOUS | Status: DC
  Administered 2011-06-25: 13:00:00 via INTRAVENOUS

## 2011-06-25 MED ORDER — AMIODARONE HCL 200 MG PO TABS
200.0000 mg | ORAL_TABLET | Freq: Every day | ORAL | Status: DC
  Administered 2011-06-27 – 2011-06-28 (×2): 200 mg via ORAL
  Filled 2011-06-25 (×3): qty 1

## 2011-06-25 MED ORDER — FENTANYL CITRATE 0.05 MG/ML IJ SOLN
INTRAMUSCULAR | Status: DC | PRN
  Administered 2011-06-25: 50 ug via INTRAVENOUS
  Administered 2011-06-25: 150 ug via INTRAVENOUS
  Administered 2011-06-25: 50 ug via INTRAVENOUS

## 2011-06-25 MED ORDER — LACTATED RINGERS IV SOLN
INTRAVENOUS | Status: DC | PRN
  Administered 2011-06-25 (×2): via INTRAVENOUS

## 2011-06-25 MED ORDER — WARFARIN SODIUM 5 MG PO TABS
5.0000 mg | ORAL_TABLET | Freq: Every day | ORAL | Status: DC
  Administered 2011-06-26 – 2011-06-28 (×3): 5 mg via ORAL
  Filled 2011-06-25 (×4): qty 1

## 2011-06-25 MED ORDER — ONDANSETRON HCL 4 MG/2ML IJ SOLN: 4.0000 mg | Freq: Once | INTRAMUSCULAR | Status: DC | PRN

## 2011-06-25 MED ORDER — LACTATED RINGERS IV SOLN
INTRAVENOUS | Status: DC | PRN
  Administered 2011-06-25: 14:00:00 via INTRAVENOUS

## 2011-06-25 MED ORDER — EPHEDRINE SULFATE 50 MG/ML IJ SOLN
INTRAMUSCULAR | Status: DC | PRN
  Administered 2011-06-25 (×3): 5 mg via INTRAVENOUS

## 2011-06-25 MED ORDER — SODIUM CHLORIDE 0.45 % IV SOLN
INTRAVENOUS | Status: DC
  Administered 2011-06-25: 18:00:00 via INTRAVENOUS

## 2011-06-25 MED ORDER — GLYCOPYRROLATE 0.2 MG/ML IJ SOLN
INTRAMUSCULAR | Status: DC | PRN
  Administered 2011-06-25: .4 mg via INTRAVENOUS

## 2011-06-25 MED ORDER — MUPIROCIN 2 % EX OINT
TOPICAL_OINTMENT | CUTANEOUS | Status: AC
  Administered 2011-06-25: 12:00:00 via NASAL
  Filled 2011-06-25: qty 22

## 2011-06-25 MED ORDER — SODIUM CHLORIDE 0.9 % IR SOLN
Status: DC | PRN
  Administered 2011-06-25: 15:00:00

## 2011-06-25 MED ORDER — VANCOMYCIN HCL IN DEXTROSE 1-5 GM/200ML-% IV SOLN
1000.0000 mg | INTRAVENOUS | Status: AC
  Administered 2011-06-26: 1000 mg via INTRAVENOUS
  Filled 2011-06-25: qty 200

## 2011-06-25 MED ORDER — PHENYLEPHRINE HCL 10 MG/ML IJ SOLN
10.0000 mg | INTRAVENOUS | Status: DC | PRN
  Administered 2011-06-25: 40 ug/min via INTRAVENOUS

## 2011-06-25 MED ORDER — ACETAMINOPHEN 325 MG PO TABS
325.0000 mg | ORAL_TABLET | ORAL | Status: DC | PRN
  Administered 2011-06-27: 325 mg via ORAL
  Filled 2011-06-25: qty 2

## 2011-06-25 MED ORDER — LEVOTHYROXINE SODIUM 75 MCG PO TABS
75.0000 ug | ORAL_TABLET | Freq: Every day | ORAL | Status: DC
  Administered 2011-06-27 – 2011-06-28 (×2): 75 ug via ORAL
  Filled 2011-06-25 (×3): qty 1

## 2011-06-25 MED ORDER — MIDAZOLAM HCL 5 MG/5ML IJ SOLN
INTRAMUSCULAR | Status: DC | PRN
  Administered 2011-06-25: 2 mg via INTRAVENOUS

## 2011-06-25 MED ORDER — LISINOPRIL 20 MG PO TABS
20.0000 mg | ORAL_TABLET | Freq: Every day | ORAL | Status: DC
  Administered 2011-06-27 – 2011-06-28 (×2): 20 mg via ORAL
  Filled 2011-06-25 (×3): qty 1

## 2011-06-25 MED ORDER — PROPOFOL 10 MG/ML IV BOLUS
INTRAVENOUS | Status: DC | PRN
  Administered 2011-06-25: 160 mg via INTRAVENOUS

## 2011-06-25 MED ORDER — SODIUM CHLORIDE 0.9 % IV SOLN: INTRAVENOUS | Status: DC

## 2011-06-25 MED ORDER — HYDROCODONE-ACETAMINOPHEN 5-325 MG PO TABS: 1.0000 | ORAL_TABLET | Freq: Four times a day (QID) | ORAL | Status: DC

## 2011-06-25 MED ORDER — ROCURONIUM BROMIDE 100 MG/10ML IV SOLN
INTRAVENOUS | Status: DC | PRN
  Administered 2011-06-25: 50 mg via INTRAVENOUS

## 2011-06-25 MED ORDER — ONDANSETRON HCL 4 MG/2ML IJ SOLN
INTRAMUSCULAR | Status: DC | PRN
  Administered 2011-06-25: 4 mg via INTRAVENOUS

## 2011-06-25 MED ORDER — CHLORHEXIDINE GLUCONATE 4 % EX LIQD: 60.0000 mL | Freq: Once | CUTANEOUS | Status: DC

## 2011-06-25 MED ORDER — ONDANSETRON HCL 4 MG/2ML IJ SOLN: 4.0000 mg | Freq: Four times a day (QID) | INTRAMUSCULAR | Status: DC | PRN

## 2011-06-25 MED ORDER — HEPARIN (PORCINE) IN NACL 2-0.9 UNIT/ML-% IJ SOLN
INTRAMUSCULAR | Status: AC
  Filled 2011-06-25: qty 1000

## 2011-06-25 SURGICAL SUPPLY — 31 items
BNDG COHESIVE 3X5 WHT NS (GAUZE/BANDAGES/DRESSINGS) ×2 IMPLANT
CANISTER SUCTION 2500CC (MISCELLANEOUS) ×2 IMPLANT
CLOTH BEACON ORANGE TIMEOUT ST (SAFETY) ×2 IMPLANT
DRAPE C-ARM 42X72 X-RAY (DRAPES) ×2 IMPLANT
DRAPE CARDIOVASCULAR INCISE (DRAPES) ×1
DRAPE INCISE IOBAN 66X45 STRL (DRAPES) ×2 IMPLANT
DRAPE PROXIMA HALF (DRAPES) ×4 IMPLANT
DRAPE SRG 135X102X78XABS (DRAPES) ×1 IMPLANT
DRSG TEGADERM 4X10 (GAUZE/BANDAGES/DRESSINGS) ×2 IMPLANT
ELECT REM PT RETURN 9FT ADLT (ELECTROSURGICAL) ×2
ELECTRODE REM PT RTRN 9FT ADLT (ELECTROSURGICAL) ×1 IMPLANT
GAUZE SPONGE 4X4 16PLY XRAY LF (GAUZE/BANDAGES/DRESSINGS) IMPLANT
GLOVE BIO SURGEON STRL SZ8 (GLOVE) ×4 IMPLANT
GLOVE BIOGEL PI IND STRL 7.5 (GLOVE) ×2 IMPLANT
GLOVE BIOGEL PI INDICATOR 7.5 (GLOVE) ×2
GOWN PREVENTION PLUS XLARGE (GOWN DISPOSABLE) ×4 IMPLANT
GOWN STRL NON-REIN LRG LVL3 (GOWN DISPOSABLE) ×2 IMPLANT
KIT ROOM TURNOVER OR (KITS) ×2 IMPLANT
PAD ARMBOARD 7.5X6 YLW CONV (MISCELLANEOUS) ×4 IMPLANT
PENCIL BUTTON HOLSTER BLD 10FT (ELECTRODE) ×2 IMPLANT
SPONGE GAUZE 4X4 12PLY (GAUZE/BANDAGES/DRESSINGS) IMPLANT
SPONGE GAUZE 4X4 STERILE 39 (GAUZE/BANDAGES/DRESSINGS) ×2 IMPLANT
SUT PROLENE 2 0 CT2 30 (SUTURE) ×4 IMPLANT
SUT SILK  1 MH (SUTURE) ×1
SUT SILK 0 FSL (SUTURE) ×4 IMPLANT
SUT SILK 1 MH (SUTURE) ×1 IMPLANT
SUT VIC AB 2-0 CT2 18 VCP726D (SUTURE) IMPLANT
SUT VIC AB 3-0 X1 27 (SUTURE) IMPLANT
TOWEL OR 17X24 6PK STRL BLUE (TOWEL DISPOSABLE) ×4 IMPLANT
TUBE CONNECTING 12X1/4 (SUCTIONS) ×2 IMPLANT
YANKAUER SUCT BULB TIP NO VENT (SUCTIONS) ×2 IMPLANT

## 2011-06-25 NOTE — Progress Notes (Signed)
Tried to request echo,stress test, notes from dr. Michaelle Copas office. Office closed for lunch.

## 2011-06-25 NOTE — Op Note (Signed)
ICD generator and lead insertion performed without immediate complication. N#[id number].

## 2011-06-25 NOTE — Interval H&P Note (Signed)
History and Physical Interval Note:  06/25/2011 12:23 PM  Austin [name]  has presented today for surgery, with the diagnosis of Infected ICD  The various methods of treatment have been discussed with the patient and family. After consideration of risks, benefits and other options for treatment, the patient has consented to  Procedure(s): ICD LEAD REMOVAL as a surgical intervention .  The patients' history has been reviewed, patient examined, no change in status, stable for surgery.  I have reviewed the patients' chart and labs.  Questions were answered to the patient's satisfaction.     Lewayne Bunting

## 2011-06-25 NOTE — Transfer of Care (Signed)
Immediate Anesthesia Transfer of Care Note  Patient: Austin [name]  Procedure(s) Performed:  ICD LEAD REMOVAL - Dr. Cornelius Moras to serve as back up  Patient Location: PACU  Anesthesia Type: General  Level of Consciousness: awake  Airway & Oxygen Therapy: Patient Spontanous Breathing and Patient connected to nasal cannula oxygen  Post-op Assessment: Report given to PACU RN  Post vital signs: stable  Complications: No apparent anesthesia complications

## 2011-06-25 NOTE — Anesthesia Procedure Notes (Signed)
Procedure Name: Intubation Date/Time: 06/25/2011 2:00 PM Performed by: Delbert Harness Pre-anesthesia Checklist: Patient identified, Timeout performed, Emergency Drugs available, Suction available and Patient being monitored Patient Re-evaluated:Patient Re-evaluated prior to inductionOxygen Delivery Method: Circle System Utilized Preoxygenation: Pre-oxygenation with 100% oxygen Intubation Type: IV induction Ventilation: Mask ventilation without difficulty Laryngoscope Size: Miller and 3 Grade View: Grade IV Tube type: Oral Tube size: 8.0 mm Number of attempts: 1 Airway Equipment and Method: bougie stylet Placement Confirmation: ETT inserted through vocal cords under direct vision,  breath sounds checked- equal and bilateral and positive ETCO2 Secured at: 23 cm Tube secured with: Tape Dental Injury: Teeth and Oropharynx as per pre-operative assessment

## 2011-06-25 NOTE — Preoperative (Signed)
Beta Blockers   Reason not to administer Beta Blockers:Not Applicable 

## 2011-06-25 NOTE — Anesthesia Postprocedure Evaluation (Signed)
  Anesthesia Post-op Note  Patient: Austin [name]  Procedure(s) Performed:  ICD LEAD REMOVAL - Dr. Cornelius Moras to serve as back up  Patient Location: PACU  Anesthesia Type: General  Level of Consciousness: awake, alert , oriented and patient cooperative  Airway and Oxygen Therapy: Patient Spontanous Breathing and Patient connected to nasal cannula oxygen  Post-op Pain: mild  Post-op Assessment: Post-op Vital signs reviewed, Patient's Cardiovascular Status Stable, Respiratory Function Stable, Patent Airway, No signs of Nausea or vomiting and Pain level controlled  Post-op Vital Signs: stable  Complications: No apparent anesthesia complications

## 2011-06-25 NOTE — H&P (View-Only) (Signed)
HPI Austin [name] returns today for follow up. He is a pleasant 70 yo man with a h/o VT, PAF, both controlled with amiodarone, and history of mitral valve endocarditis status post replacement. The patient has undergone initial body pacemaker insertion and then developed ventricular tachycardia and had upgrade to a biventricular ICD with new left ventricular lead insertion. The patient has done relatively well for over 2 years. He developed swelling and erythema at his ICD insertion site. He has had no fevers and no chills and no night sweats. He feels well. He was given antibiotic therapy with clindamycin. He has seen some reduction in erythema. He denies any recent ICD shocks. No Known Allergies   Current Outpatient Prescriptions  Medication Sig Dispense Refill  . amiodarone (PACERONE) 200 MG tablet Take 1 tablet (200 mg total) by mouth daily.  30 tablet  9  . amoxicillin (AMOXIL) 500 MG tablet Take 500 mg by mouth 2 (two) times daily.        Marland Kitchen doxycycline (DORYX) 100 MG DR capsule Take 100 mg by mouth daily.        . folic acid (FOLVITE) 1 MG tablet Take 1 mg by mouth daily.        . Levothyroxine Sodium 75 MCG CAPS Take by mouth daily.        Marland Kitchen lisinopril (PRINIVIL,ZESTRIL) 20 MG tablet Take 20 mg by mouth daily.        . Multiple Vitamins-Minerals (MULTIVITAL) tablet Take 1 tablet by mouth daily.        . Omega-3 Fatty Acids (FISH OIL) 1000 MG CAPS Take by mouth daily.        Marland Kitchen warfarin (JANTOVEN) 5 MG tablet Take 5 mg by mouth daily.        . clindamycin (CLEOCIN) 300 MG capsule Take 1 capsule (300 mg total) by mouth 3 (three) times daily.  40 capsule  0  . doxycycline (VIBRAMYCIN) 100 MG capsule Take 1 capsule (100 mg total) by mouth 2 (two) times daily.  60 capsule  3     Past Medical History  Diagnosis Date  . Unspecified cardiovascular disease   . Unspecified sleep apnea   . Pure hypercholesterolemia   . Psychosexual dysfunction with inhibited sexual excitement   . Conductive hearing  loss   . Syncope and collapse   . Atrial fibrillation   . Intraspinal abscess   . Mitral valve insufficiency and aortic valve insufficiency   . Personal history of surgery to heart and great vessels, presenting hazards to health     ROS:   All systems reviewed and negative except as noted in the HPI.   Past Surgical History  Procedure Date  . Mitral valve replacement     w #[facility]  . Valve replacement 2000  . Evacution of epidural lumbar epidural abscess 1999  . Thyroidectomy   . Tonsillectomy   . Dental implants      No family history on file.   History   Social History  . Marital Status: Married    Spouse Name: N/A    Number of Children: N/A  . Years of Education: N/A   Occupational History  . Not on file.   Social History Main Topics  . Smoking status: Never Smoker   . Smokeless tobacco: Not on file  . Alcohol Use: Yes  . Drug Use: Not on file  . Sexually Active: Not on file   Other Topics Concern  . Not on file  Social History Narrative  . No narrative on file     BP 104/62  Pulse 65  Ht 6\' 1"  (1.854 m)  Wt 91.227 kg (201 lb 1.9 oz)  BMI 26.53 kg/m2  Physical Exam:  Well appearing NAD HEENT: Unremarkable Neck:  No JVD, no thyromegally Lymphatics:  No adenopathy Back:  No CVA tenderness Lungs:  Clear with no wheezes, rales, or rhonchi. ICD incision is swollen and erythematous. It is not tender. HEART:  Regular rate rhythm, no murmurs, no rubs, no clicks Abd:  soft, positive bowel sounds, no organomegally, no rebound, no guarding Ext:  2 plus pulses, no edema, no cyanosis, no clubbing Skin:  No rashes no nodules Neuro:  CN II through XII intact, motor grossly intact  DEVICE  Device interrogation demonstrates that he has underlying sinus rhythm with one to one AV conduction.   Assess/Plan:

## 2011-06-25 NOTE — Consult Note (Signed)
Reason for Consult:Surgical backup for ICD removal Referring Physician: Dr. Ival Bible is an 70 y.o. male.  HPI: 70 yo male with hx of MVR, atrial fib, LV dysfunction. Presents with infected ICD. Dr. Ladona Ridgel to remove today.  Past Medical History  Diagnosis Date  . Unspecified cardiovascular disease   . Pure hypercholesterolemia   . Psychosexual dysfunction with inhibited sexual excitement   . Conductive hearing loss   . Syncope and collapse   . Atrial fibrillation   . Intraspinal abscess   . Mitral valve insufficiency and aortic valve insufficiency   . Personal history of surgery to heart and great vessels, presenting hazards to health   . Unspecified sleep apnea     last sleep study 11/07    Past Surgical History  Procedure Date  . Mitral valve replacement     w #[address] [facility]  . Valve replacement 2000  . Evacution of epidural lumbar epidural abscess 1999  . Thyroidectomy   . Tonsillectomy   . Dental implants   . Cardiac catheterization     doesn't remember when  . Insert / replace / remove pacemaker 11/2008    History reviewed. No pertinent family history.  Social History:  reports that he has never smoked. He does not have any smokeless tobacco history on file. He reports that he drinks alcohol. He reports that he does not use illicit drugs.  Allergies: No Known Allergies  Medications:  No current facility-administered medications on file prior to encounter.   Current Outpatient Prescriptions on File Prior to Encounter  Medication Sig Dispense Refill  . amiodarone (PACERONE) 200 MG tablet Take 1 tablet (200 mg total) by mouth daily.  30 tablet  9  . doxycycline (VIBRAMYCIN) 100 MG capsule Take 1 capsule (100 mg total) by mouth 2 (two) times daily.  60 capsule  3  . folic acid (FOLVITE) 1 MG tablet Take 1 mg by mouth daily.        . Levothyroxine Sodium 75 MCG CAPS Take by mouth daily.        Marland Kitchen lisinopril (PRINIVIL,ZESTRIL) 20 MG tablet Take 20 mg by  mouth daily.        . Multiple Vitamins-Minerals (MULTIVITAL) tablet Take 1 tablet by mouth daily.        . Omega-3 Fatty Acids (FISH OIL) 1000 MG CAPS Take by mouth daily.        Marland Kitchen warfarin (JANTOVEN) 5 MG tablet Take 5 mg by mouth daily. Takes 7.5 mg on tue and sat      . amoxicillin (AMOXIL) 500 MG tablet Take 500 mg by mouth daily as needed. Takes 4 capsules 1 hour prior to any dental procedure         Results for orders placed during the hospital encounter of 06/25/11 (from the past 48 hour(s))  TYPE AND SCREEN     Status: Normal   Collection Time   06/25/11 12:15 PM      Component Value Range Comment   ABO/RH(D) A POS      Antibody Screen NEG      Sample Expiration 06/28/2011     PROTIME-INR     Status: Abnormal   Collection Time   06/25/11 12:19 PM      Component Value Range Comment   Prothrombin Time 17.3 (*) 11.6 - 15.2 (seconds)    INR 1.39  0.00 - 1.49    APTT     Status: Normal   Collection Time  06/25/11 12:19 PM      Component Value Range Comment   aPTT 35  24 - 37 (seconds)     Dg Chest 2 View  06/25/2011  *RADIOLOGY REPORT*  Clinical Data: Post defibrillator extraction  CHEST - 2 VIEW  Comparison: 06/09/2011  Findings: Left-sided AICD in place.  Evidence of mitral valvuloplasty again noted.  No pleural effusion.  The lungs are clear.  No pneumothorax.  Heart size is mildly enlarged without edema, unchanged.  IMPRESSION: Mild cardiomegaly without focal acute finding.  Original Report Authenticated By: Harrel Lemon, M.D.    ROS Blood pressure 151/83, pulse 65, temperature 97.6 F (36.4 C), temperature source Oral, resp. rate 18, SpO2 100.00%. Physical Exam BP 151/83  Pulse 65  Temp(Src) 97.6 F (36.4 C) (Oral)  Resp 18  SpO2 100% Gen: WD/WN , NAD Neuro A&o x 3 Swollen, erythematous ICD generator pocket left anterior chest wall Well healed midline sternotomy incision. Cardiac RRR  Assessment/Plan: For removal of infected ICD.  Our team will  provide surgical backup in event of perforation . He would be extremely high risk if surgery necessary given his previous cardiac operation  Lace Chenevert C 06/25/2011, 1:38 PM

## 2011-06-25 NOTE — Anesthesia Preprocedure Evaluation (Addendum)
Anesthesia Evaluation  Patient identified by MRN, date of birth, ID band Patient awake    Reviewed: Allergy & Precautions, H&P , NPO status , Patient's Chart, lab work & pertinent test results  Airway Mallampati: II  Neck ROM: full    Dental  (+) Teeth Intact   Pulmonary sleep apnea ,    Pulmonary exam normal       Cardiovascular +CHF + dysrhythmias Ventricular Tachycardia + Cardiac Defibrillator + Valvular Problems/Murmurs irregular Normal    Neuro/Psych Negative Neurological ROS     GI/Hepatic negative GI ROS, Neg liver ROS,   Endo/Other  Negative Endocrine ROS  Renal/GU negative Renal ROS  Genitourinary negative   Musculoskeletal   Abdominal   Peds  Hematology negative hematology ROS (+)   Anesthesia Other Findings   Reproductive/Obstetrics                         Anesthesia Physical Anesthesia Plan  ASA: IV  Anesthesia Plan: General   Post-op Pain Management:    Induction: Intravenous  Airway Management Planned: Oral ETT  Additional Equipment: Arterial line  Intra-op Plan:   Post-operative Plan: Extubation in OR  Informed Consent: I have reviewed the patients History and Physical, chart, labs and discussed the procedure including the risks, benefits and alternatives for the proposed anesthesia with the patient or authorized representative who has indicated his/her understanding and acceptance.     Plan Discussed with: CRNA, Surgeon and Anesthesiologist  Anesthesia Plan Comments:        Anesthesia Quick Evaluation

## 2011-06-26 LAB — CBC
HCT: 35.8 % — ABNORMAL LOW (ref 39.0–52.0)
MCHC: 32.1 g/dL (ref 30.0–36.0)
MCV: 90.2 fL (ref 78.0–100.0)
RDW: 15.2 % (ref 11.5–15.5)
WBC: 10.8 10*3/uL — ABNORMAL HIGH (ref 4.0–10.5)

## 2011-06-26 LAB — BASIC METABOLIC PANEL
CO2: 26 mEq/L (ref 19–32)
Calcium: 8.1 mg/dL — ABNORMAL LOW (ref 8.4–10.5)
Chloride: 102 mEq/L (ref 96–112)
Creatinine, Ser: 0.87 mg/dL (ref 0.50–1.35)
GFR calc Af Amer: >90 mL/min (ref >90)
Sodium: 135 mEq/L (ref 135–145)

## 2011-06-26 LAB — GLUCOSE, CAPILLARY: Glucose-Capillary: 117 mg/dL — ABNORMAL HIGH (ref 70–99)

## 2011-06-26 MED ORDER — LISINOPRIL 20 MG PO TABS
20.0000 mg | ORAL_TABLET | ORAL | Status: AC
  Administered 2011-06-26: 20 mg via ORAL
  Filled 2011-06-26: qty 1

## 2011-06-26 MED ORDER — WHITE PETROLATUM GEL
Status: DC | PRN
  Filled 2011-06-26: qty 5

## 2011-06-26 MED ORDER — ENOXAPARIN SODIUM 100 MG/ML ~~LOC~~ SOLN
90.0000 mg | Freq: Once | SUBCUTANEOUS | Status: AC
  Administered 2011-06-26: 90 mg via SUBCUTANEOUS
  Filled 2011-06-26: qty 1

## 2011-06-26 MED ORDER — WARFARIN SODIUM 5 MG PO TABS
5.0000 mg | ORAL_TABLET | ORAL | Status: AC
  Administered 2011-06-26: 5 mg via ORAL
  Filled 2011-06-26: qty 1

## 2011-06-26 MED ORDER — AMIODARONE HCL 200 MG PO TABS
200.0000 mg | ORAL_TABLET | ORAL | Status: AC
  Administered 2011-06-26: 200 mg via ORAL
  Filled 2011-06-26: qty 1

## 2011-06-26 MED ORDER — ENOXAPARIN SODIUM 100 MG/ML ~~LOC~~ SOLN
90.0000 mg | Freq: Every day | SUBCUTANEOUS | Status: DC
  Administered 2011-06-26: 22:00:00 via SUBCUTANEOUS
  Filled 2011-06-26 (×2): qty 1

## 2011-06-26 MED ORDER — ENOXAPARIN SODIUM 100 MG/ML ~~LOC~~ SOLN
90.0000 mg | Freq: Two times a day (BID) | SUBCUTANEOUS | Status: DC
  Filled 2011-06-26 (×2): qty 1

## 2011-06-26 MED ORDER — HYDROCODONE-ACETAMINOPHEN 5-325 MG PO TABS: 1.0000 | ORAL_TABLET | Freq: Four times a day (QID) | ORAL | Status: DC | PRN

## 2011-06-26 MED ORDER — LEVOTHYROXINE SODIUM 75 MCG PO TABS
75.0000 ug | ORAL_TABLET | ORAL | Status: AC
  Administered 2011-06-26: 75 ug via ORAL
  Filled 2011-06-26: qty 1

## 2011-06-26 NOTE — Op Note (Signed)
NAMEFREEMON, BINFORD NO.:  [id number]  MEDICAL RECORD NO.:  [id number]  LOCATION:  [facility]                         FACILITY:  [facility]  PHYSICIAN:  Doylene Canning. Ladona Ridgel, MD    DATE OF BIRTH:  1941-02-02  DATE OF PROCEDURE:  06/25/2011 DATE OF DISCHARGE:                              OPERATIVE REPORT   PROCEDURE PERFORMED:  Extraction of a biventricular ICD and a capped [employer]onic StarFix active fixation LV lead.  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  The patient underwent biventricular pacemaker initially.  He subsequently had elevated LV pacing thresholds and was also found to have syncope with his pacemaker demonstrating ventricular tachycardia.  He underwent upgrade to a biventricular ICD approximately 2-1/2 years ago.  He initially did well with the exception of ventricular tachycardia.  The patient at the time of his upgrade had new LV lead placed.  The patient, approximately 6 months ago noticed swelling at his ICD insertion site.  Initially, the swelling went away but then returned approximately 2 months ago.  He has had no fevers or chills.  He has had erythema and redness and swelling over his ICD insertion site, and he is now referred for removal of his entire system.  PROCEDURE IN DETAIL:  After informed was obtained, the patient was taken to the operating room in a fasting state.  He was sedated and intubated by the anesthesia service.  A 7-cm incision was carried out over the old ICD insertion site.  Electrocautery was utilized to dissect down to the ICD pocket.  There was very extensive chronic fibrous scar tissue present with chronic active inflammation in the pocket.  The pocket had about 2 inches of inflammatory tissue between the skin and the generator.  This was debrided thoroughly.  The leads were freed up  with electrocautery.  The generator was removed.  Attention was then directed at the leads for removal.  The new LV lead was removed initially.  An 014 Mailman angioplasty guidewire was advanced into the LV lead, and with gentle traction, the lead was removed without difficulty.  Next, attention was turned to the atrial lead.  A 52-cm stylet was advanced into the atrial lead and the helix was counterclockwise retracted back and the lead was removed with gentle traction.  Attention was then turned to the defibrillator lead.  A 65-cm stylet was advanced into the defibrillator body and the helix was retracted.  Counterclockwise traction was placed on the lead, and with gentle traction, the lead also was removed in total.  The patient tolerated this nicely.  Attention was then turned to the StarFix lead.  Initially, the liberator locking stylet was advanced into the lead just past the active fixation mechanism.  Gentle traction was placed at this point on the lead body. There was no movement on the lead.  At this point, the Cook's EVOLUTION- shortie 11-French sheath was advanced into the subclavian vein and on into the innominate vein.  At this point, the [facility] introducer sheath was removed, and the 13-French cook EDS dissection  sheath was advanced over the StarFix active fixation LV lead.  Utilizing a combination of pressure counter, pressure traction, and counter traction, the sheath was advanced over the body of the LV lead down to the coronary sinus ostium.  At this location, there was very dense scar tissue present. Again, utilizing tension and very minimal pressure and activating the electrosurgical dissection sheath, the sheath was slowly against over the LV lead to the point of the active fixation mechanism.  At this point, the lead was fixed.  Utilizing additional electrosurgical dissection, cautery very slow but sustained advancement of the sheath was carried out.  Initially, the  insulation on the LV lead broke, but there was still a fixation mechanism in place.  At this point, the additional attention and traction and electrocautery on the surgical site dissection sheath resulted in removal of the lead in total.  The patient tolerated this hemodynamically.  He was subsequently allowed to awaken under the direction of anesthesia.  During this time, the pocket was debrided extensively and electrocautery was utilized to assure hemostasis.  The pocket was irrigated with antibiotic irrigation, and the incision was closed with simple Prolene mattress sutures.  Pressure dressing was placed and paced on the wound, and the patient was returned to the PACU in satisfactory condition.  COMPLICATIONS:  There are no immediate procedure complications.  RESULTS:  This demonstrates successful extraction of a biventricular ICD and extra active fixation LV pacing lead in a patient with ICD pocket infection.     Doylene Canning. Ladona Ridgel, MD     GWT/MEDQ  D:  06/25/2011  T:  06/25/2011  Job:  [id number]  cc:   Lyn Records, M.D.

## 2011-06-26 NOTE — Progress Notes (Signed)
Pt tx to 2013. Ambulated w/o assist entire length, tolerated well. Rcv'd by Rn & tele confirmed.

## 2011-06-26 NOTE — Progress Notes (Signed)
ANTICOAGULATION CONSULT NOTE - Initial Consult  Pharmacy Consult for Lovenox Indication: H/O [employer] MVR  No Known Allergies  Patient Measurements: Height: 6\' 1"  (185.4 cm) Weight: 201 lb (91.173 kg) IBW/kg (Calculated) : 79.9    Vital Signs: Temp: 98.1 F (36.7 C) (11/30 1830) BP: 138/81 mmHg (11/30 1645) Pulse Rate: 55  (12/01 0045)  Labs:  Basename 06/26/11 0045 06/25/11 1219  HGB 11.5* --  HCT 35.8* --  PLT 139* --  APTT -- 35  LABPROT -- 17.3*  INR -- 1.39  HEPARINUNFRC -- --  CREATININE -- --  CKTOTAL -- --  CKMB -- --  TROPONINI -- --   Estimated Creatinine Clearance: 61.5 ml/min (by C-G formula based on Cr of 1.3).  Medical History: Past Medical History  Diagnosis Date  . Unspecified cardiovascular disease   . Pure hypercholesterolemia   . Psychosexual dysfunction with inhibited sexual excitement   . Conductive hearing loss   . Syncope and collapse   . Atrial fibrillation   . Intraspinal abscess   . Mitral valve insufficiency and aortic valve insufficiency   . Personal history of surgery to heart and great vessels, presenting hazards to health   . Unspecified sleep apnea     last sleep study 11/07    Medications:  Prescriptions prior to admission  Medication Sig Dispense Refill  . amiodarone (PACERONE) 200 MG tablet Take 1 tablet (200 mg total) by mouth daily.  30 tablet  9  . doxycycline (VIBRAMYCIN) 100 MG capsule Take 1 capsule (100 mg total) by mouth 2 (two) times daily.  60 capsule  3  . folic acid (FOLVITE) 1 MG tablet Take 1 mg by mouth daily.        . Levothyroxine Sodium 75 MCG CAPS Take by mouth daily.        Marland Kitchen lisinopril (PRINIVIL,ZESTRIL) 20 MG tablet Take 20 mg by mouth daily.        . Multiple Vitamins-Minerals (MULTIVITAL) tablet Take 1 tablet by mouth daily.        . Omega-3 Fatty Acids (FISH OIL) 1000 MG CAPS Take by mouth daily.        Marland Kitchen warfarin (JANTOVEN) 5 MG tablet Take 5 mg by mouth daily. Takes 7.5 mg on tue and sat        . amoxicillin (AMOXIL) 500 MG tablet Take 500 mg by mouth daily as needed. Takes 4 capsules 1 hour prior to any dental procedure        Assessment: 70 yo male s/p infected ICD lead removal, h/o MVR/Afib, for anticoagulation  Coumadin restarted per MD  Goal of Therapy:   Full anticoagulation with Lovenox    Plan:  Lovenox 90 mg SQ q12h  Judeth Gilles, Gary Fleet 06/26/2011,12:57 AM

## 2011-06-26 NOTE — Progress Notes (Signed)
Patient ID: Austin [name], male   DOB: [birth year], 70 y.o.   MRN: [id number] Subjective:  Stable overnight. No chest pain.   Objective:  Vital Signs in the last 24 hours: Temp:  [97.6 F (36.4 C)-98.4 F (36.9 C)] 98.4 F (36.9 C) (12/01 0445) Pulse Rate:  [52-67] 56  (12/01 0500) Resp:  [9-27] 10  (12/01 0500) BP: (89-151)/(46-83) 93/46 mmHg (12/01 0500) SpO2:  [88 %-100 %] 97 % (12/01 0500) Arterial Line BP: (97-159)/(37-88) 99/37 mmHg (12/01 0500) Weight:  [91.173 kg (201 lb)-91.8 kg (202 lb 6.1 oz)] 202 lb 6.1 oz (91.8 kg) (12/01 0600)  Intake/Output from previous day: 11/30 0701 - 12/01 0700 In: 2500 [I.V.:2300; IV Piggyback:200] Out: 800 [Urine:750; Blood:50] Intake/Output from this shift:    Physical Exam: Well appearing NAD HEENT: Unremarkable Neck:  No JVD, no thyromegally Lungs:  Clear with no wheezes. Moderate-large hematoma. HEART:  Regular rate rhythm, no murmurs, no rubs, no clicks Abd:  Flat, positive bowel sounds, no organomegally, no rebound, no guarding Ext:  2 plus pulses, no edema, no cyanosis, no clubbing Skin:  No rashes no nodules Neuro:  CN II through XII intact, motor grossly intact  Lab Results:  Basename 06/26/11 0045  WBC 10.8*  HGB 11.5*  PLT 139*    Basename 06/26/11 0045  NA 135  K 4.2  CL 102  CO2 26  GLUCOSE 121*  BUN 16  CREATININE 0.87   No results found for this basename: TROPONINI:2,CK,MB:2 in the last 72 hours Hepatic Function Panel No results found for this basename: PROT,ALBUMIN,AST,ALT,ALKPHOS,BILITOT,BILIDIR,IBILI in the last 72 hours No results found for this basename: CHOL in the last 72 hours No results found for this basename: PROTIME in the last 72 hours  Imaging: Dg Chest 2 View  06/25/2011  *RADIOLOGY REPORT*  Clinical Data: Post defibrillator extraction  CHEST - 2 VIEW  Comparison: 06/09/2011  Findings: Left-sided AICD in place.  Evidence of mitral valvuloplasty again noted.  No pleural effusion.  The lungs  are clear.  No pneumothorax.  Heart size is mildly enlarged without edema, unchanged.  IMPRESSION: Mild cardiomegaly without focal acute finding.  Original Report Authenticated By: Harrel Lemon, M.D.   Dg Chest Port 1 View  06/25/2011  *RADIOLOGY REPORT*  Clinical Data: Removal of pacemaker.  PORTABLE CHEST - 1 VIEW  Comparison: Chest x-ray 06/25/2011.  Findings: The permanent left-sided pacemaker, pacer wires and AICD have been removed.  No residual elements are identified.  A right IJ central venous catheter is noted in the mid SVC.  Mild central vascular congestion and low lung volumes with vascular crowding and atelectasis.  IMPRESSION:  1.  Removal of pacer wires / AICD. 2.  Right IJ central venous catheter in good position without complicating features. 3.  Vascular congestion with low lung volumes and vascular crowding and atelectasis.  Original Report Authenticated By: P. Loralie Champagne, M.D.   Dg C-arm 1-60 Min-no Report  06/25/2011  CLINICAL DATA: Lead extraction   C-ARM 1-60 MINUTES  Fluoroscopy was utilized by the requesting physician.  No radiographic  interpretation.      Cardiac Studies: 2D echo - pending Assessment/Plan:  1. ICD system infection - s/p ICD system extraction doing well.  2. Mechanical MV - will continue anti-coagulation. 3. Chronic systolic CHF - he is well compensated. Continue  Current meds. 4. Disposition - transfer to tele 5. Coags - will continue lovenox. Unfortunately he has a hematoma. Will follow.    LOS: 1 day  Lewayne Bunting 06/26/2011, 7:31 AM

## 2011-06-27 DIAGNOSIS — I369 Nonrheumatic tricuspid valve disorder, unspecified: Secondary | ICD-10-CM

## 2011-06-27 LAB — CBC
MCH: 29.2 pg (ref 26.0–34.0)
MCV: 90.4 fL (ref 78.0–100.0)
Platelets: 157 10*3/uL (ref 150–400)
RBC: 3.97 MIL/uL — ABNORMAL LOW (ref 4.22–5.81)

## 2011-06-27 LAB — BASIC METABOLIC PANEL
CO2: 30 mEq/L (ref 19–32)
Calcium: 8.7 mg/dL (ref 8.4–10.5)
Glucose, Bld: 93 mg/dL (ref 70–99)
Sodium: 135 mEq/L (ref 135–145)

## 2011-06-27 LAB — GLUCOSE, CAPILLARY
Glucose-Capillary: 99 mg/dL (ref 70–99)
Glucose-Capillary: 111 mg/dL — ABNORMAL HIGH (ref 70–99)
Glucose-Capillary: 129 mg/dL — ABNORMAL HIGH (ref 70–99)

## 2011-06-27 MED ORDER — DOXYCYCLINE HYCLATE 100 MG PO TABS
100.0000 mg | ORAL_TABLET | Freq: Two times a day (BID) | ORAL | Status: DC
  Administered 2011-06-27 – 2011-06-28 (×3): 100 mg via ORAL
  Filled 2011-06-27 (×4): qty 1

## 2011-06-27 MED ORDER — ZOLPIDEM TARTRATE 5 MG PO TABS
5.0000 mg | ORAL_TABLET | Freq: Every evening | ORAL | Status: DC | PRN
  Administered 2011-06-27: 5 mg via ORAL
  Filled 2011-06-27: qty 1

## 2011-06-27 NOTE — Progress Notes (Addendum)
Patient ID: Austin [name], male   DOB: 10-03-1940, 70 y.o.   MRN: [id number] Subjective:  No chest pain or sob. C/o drainage from ICD extraction site.  Objective:  Vital Signs in the last 24 hours: Temp:  [97.2 F (36.2 C)-98 F (36.7 C)] 97.8 F (36.6 C) (12/02 0435) Pulse Rate:  [55-61] 60  (12/02 0435) Resp:  [11-18] 18  (12/02 0435) BP: (96-110)/(48-69) 107/60 mmHg (12/02 0435) SpO2:  [94 %-99 %] 94 % (12/02 0435) Arterial Line BP: (94-110)/(31-42) 94/31 mmHg (12/01 1100)  Intake/Output from previous day: 12/01 0701 - 12/02 0700 In: 480 [P.O.:480] Out: -  Intake/Output from this shift:    Physical Exam: Well appearing NAD HEENT: Unremarkable Neck:  No JVD, no thyromegally Lymphatics:  No adenopathy Back:  No CVA tenderness Lungs:  Clear. Large hematoma at op site. HEART:  Regular rate rhythm, no murmurs, no rubs, no clicks Abd:  Flat, positive bowel sounds, no organomegally, no rebound, no guarding Ext:  2 plus pulses, no edema, no cyanosis, no clubbing Skin:  No rashes no nodules Neuro:  CN II through XII intact, motor grossly intact  Lab Results:  Basename 06/27/11 0530 06/26/11 0045  WBC 9.1 10.8*  HGB 11.6* 11.5*  PLT 157 139*    Basename 06/27/11 0530 06/26/11 0045  NA 135 135  K 3.7 4.2  CL 100 102  CO2 30 26  GLUCOSE 93 121*  BUN 18 16  CREATININE 1.08 0.87   No results found for this basename: TROPONINI:2,CK,MB:2 in the last 72 hours Hepatic Function Panel No results found for this basename: PROT,ALBUMIN,AST,ALT,ALKPHOS,BILITOT,BILIDIR,IBILI in the last 72 hours No results found for this basename: CHOL in the last 72 hours No results found for this basename: PROTIME in the last 72 hours  Imaging: Dg Chest 2 View  06/25/2011  *RADIOLOGY REPORT*  Clinical Data: Post defibrillator extraction  CHEST - 2 VIEW  Comparison: 06/09/2011  Findings: Left-sided AICD in place.  Evidence of mitral valvuloplasty again noted.  No pleural effusion.  The lungs  are clear.  No pneumothorax.  Heart size is mildly enlarged without edema, unchanged.  IMPRESSION: Mild cardiomegaly without focal acute finding.  Original Report Authenticated By: Harrel Lemon, M.D.   Dg Chest Port 1 View  06/25/2011  *RADIOLOGY REPORT*  Clinical Data: Removal of pacemaker.  PORTABLE CHEST - 1 VIEW  Comparison: Chest x-ray 06/25/2011.  Findings: The permanent left-sided pacemaker, pacer wires and AICD have been removed.  No residual elements are identified.  A right IJ central venous catheter is noted in the mid SVC.  Mild central vascular congestion and low lung volumes with vascular crowding and atelectasis.  IMPRESSION:  1.  Removal of pacer wires / AICD. 2.  Right IJ central venous catheter in good position without complicating features. 3.  Vascular congestion with low lung volumes and vascular crowding and atelectasis.  Original Report Authenticated By: P. Loralie Champagne, M.D.   Dg C-arm 1-60 Min-no Report  06/25/2011  CLINICAL DATA: Lead extraction   C-ARM 1-60 MINUTES  Fluoroscopy was utilized by the requesting physician.  No radiographic  interpretation.      Cardiac Studies: Tele - NSR Assessment/Plan:  1. ICD pocket infection - s/p removal. Will start oral anti-biotics. 2. Pocket hematoma - this will heal slowly as the patient requires anti-coagulation. 3. Chronic systolic heart failure - he is well compensated. 4. Disp. - consider discharge tomorrow if stable. He will need a life vest.  LOS: 2 days  Lewayne Bunting 06/27/2011, 8:58 AM    Will hold lovenox today with large hematoma.

## 2011-06-27 NOTE — Progress Notes (Signed)
  Echocardiogram 2D Echocardiogram has been performed.  Dayvon Dax Lynn Corbyn Wildey, RDCS 06/27/2011, 2:25 PM

## 2011-06-28 ENCOUNTER — Encounter (HOSPITAL_COMMUNITY): Payer: Self-pay | Admitting: Nurse Practitioner

## 2011-06-28 DIAGNOSIS — T827XXA Infection and inflammatory reaction due to other cardiac and vascular devices, implants and grafts, initial encounter: Secondary | ICD-10-CM

## 2011-06-28 LAB — TYPE AND SCREEN: Unit division: 0

## 2011-06-28 LAB — CBC
HCT: 34.8 % — ABNORMAL LOW (ref 39.0–52.0)
Hemoglobin: 11.3 g/dL — ABNORMAL LOW (ref 13.0–17.0)
MCV: 89.5 fL (ref 78.0–100.0)
RBC: 3.89 MIL/uL — ABNORMAL LOW (ref 4.22–5.81)
WBC: 9.4 10*3/uL (ref 4.0–10.5)

## 2011-06-28 LAB — BASIC METABOLIC PANEL
CO2: 27 mEq/L (ref 19–32)
Chloride: 102 mEq/L (ref 96–112)
Creatinine, Ser: 1.04 mg/dL (ref 0.50–1.35)
Potassium: 3.9 mEq/L (ref 3.5–5.1)
Sodium: 137 mEq/L (ref 135–145)

## 2011-06-28 MED ORDER — DOXYCYCLINE HYCLATE 100 MG PO CAPS: 100.0000 mg | ORAL_CAPSULE | Freq: Two times a day (BID) | ORAL | Status: DC

## 2011-06-28 MED ORDER — DOXYCYCLINE HYCLATE 100 MG PO TABS: 100.0000 mg | ORAL_TABLET | Freq: Two times a day (BID) | ORAL | Status: DC

## 2011-06-28 MED ORDER — ENOXAPARIN SODIUM 100 MG/ML ~~LOC~~ SOLN: 90.0000 mg | Freq: Two times a day (BID) | SUBCUTANEOUS | Status: DC

## 2011-06-28 MED ORDER — HYDROCODONE-ACETAMINOPHEN 5-325 MG PO TABS: 1.0000 | ORAL_TABLET | Freq: Four times a day (QID) | ORAL | Status: AC | PRN

## 2011-06-28 NOTE — Progress Notes (Signed)
Patient ID: Austin [name], male   DOB: 1941/01/11, 70 y.o.   MRN: [id number] Subjective:  No chest pain or sob.  Objective:  Vital Signs in the last 24 hours: Temp:  [97.7 F (36.5 C)-98.4 F (36.9 C)] 98.4 F (36.9 C) (12/03 0530) Pulse Rate:  [65-70] 70  (12/03 0530) Resp:  [18-20] 20  (12/03 0530) BP: (117-125)/(67-74) 121/70 mmHg (12/03 0530) SpO2:  [95 %-99 %] 95 % (12/03 0530)  Intake/Output from previous day: 12/02 0701 - 12/03 0700 In: 240 [P.O.:240] Out: -  Intake/Output from this shift:    Physical Exam: Well appearing NAD HEENT: Unremarkable Neck:  No JVD, no thyromegally Lymphatics:  No adenopathy Back:  No CVA tenderness Lungs:  Clear. Large hematoma at incision site. HEART:  Regular rate rhythm, mechanical S1.  no murmurs, no rubs, no clicks Abd:  Flat, positive bowel sounds, no organomegally, no rebound, no guarding Ext:  2 plus pulses, no edema, no cyanosis, no clubbing Skin:  No rashes no nodules Neuro:  CN II through XII intact, motor grossly intact  Lab Results:  Basename 06/28/11 0500 06/27/11 0530  WBC 9.4 9.1  HGB 11.3* 11.6*  PLT 160 157    Basename 06/28/11 0500 06/27/11 0530  NA 137 135  K 3.9 3.7  CL 102 100  CO2 27 30  GLUCOSE 98 93  BUN 14 18  CREATININE 1.04 1.08   No results found for this basename: TROPONINI:2,CK,MB:2 in the last 72 hours Hepatic Function Panel No results found for this basename: PROT,ALBUMIN,AST,ALT,ALKPHOS,BILITOT,BILIDIR,IBILI in the last 72 hours No results found for this basename: CHOL in the last 72 hours No results found for this basename: PROTIME in the last 72 hours  Imaging: No results found.  Cardiac Studies:  Assessment/Plan:  1. ICD system infection - s/p extraction. Wound with large hematoma. Start doxycycline.  2. H/o VT with syncope - will obtain life vest prior to discharge.  3. Coags - INR still subtherapeutic. Will give a single dose of lovenox tomorrow then q12 lovenox for two days  then PT/INR on Friday in our office. Continue coumadin at outpatient dosing.  4. Chronic systolic heart failure - he is well compensated. Continue outpatient CHF meds.  LOS: 3 days    Lewayne Bunting 06/28/2011, 10:49 AM

## 2011-06-28 NOTE — Discharge Summary (Signed)
Patient ID: Austin [name],  MRN: [id number], DOB/AGE: March 22, 1941 70 y.o.  Admit date: 06/25/2011 Discharge date: 06/28/2011  Primary Cardiologist: Lewayne Bunting   Discharge Diagnoses Principal Problem:  *Cardiac device, implant, or graft infection or inflammation Active Problems:  HYPERCHOLESTEROLEMIA  VENTRICULAR TACHYCARDIA  ATRIAL FIBRILLATION, CHRONIC  Chronic systolic heart failure  SLEEP APNEA  MITRAL VALVE REPLACEMENT, HX OF  Cardiomyopathy   Allergies No Known Allergies  Procedures  06/25/2011 - Extraction of a biventricular ICD and a capped  Medtronic StarFix active fixation LV lead.   History of Present Illness  70 y/o male with above problem list.  Pt had previously noted erythema of Bi-V ICD site and this had been followed closely in the outpatient setting.  Pt was managed with outpt antibiotics but the site did not significantly improve.  He saw Dr. Ladona Ridgel on 11/16 and it was felt that the whole system would have to be explanted.  Hospital Course   Pt presented to [facility] on 11/30 and underwent successful explantation of previously placed Bi-V ICD with capping of the LV lead.  Pt tolerated procedure well, and his coumadin was resumed post procedure, as was Lovenox for bridging given h/o MVR.  Pt unfortunately developed a hematoma over his explant site and lovenox was held.  His site along with his H/H have been stable.  In the absence of an ICD, with h/o VT, pt has been fitted for a LifeVest, which has been placed today.  His INR remains subRx but it is felt that pt is ready for d/c today.  He has been given a Rx for Lovenox which he will start tomorrow, 12/4, taking one injection tomorrow then begin a q12h regimen on 12/5 with plan for f/u INR in our office on 12/7.  Discharge Vitals:  Blood pressure 115/66, pulse 63, temperature 97.8 F (36.6 C), temperature source Oral, resp. rate 19, height 6\' 1"  (1.854 m), weight 91.8 kg (202 lb 6.1 oz), SpO2 98.00%.    Labs: CBC:  Basename 06/28/11 0500 06/27/11 0530  WBC 9.4 9.1  NEUTROABS -- --  HGB 11.3* 11.6*  HCT 34.8* 35.9*  MCV 89.5 90.4  PLT 160 157   Basic Metabolic Panel:  Basename 06/28/11 0500 06/27/11 0530  NA 137 135  K 3.9 3.7  CL 102 100  CO2 27 30  GLUCOSE 98 93  BUN 14 18  CREATININE 1.04 1.08  CALCIUM 8.8 8.7  MG -- --  PHOS -- --    Disposition:  Follow-up Information    Follow up with [facility]. (we will arrange for 12/7)    Contact information:   [facility] [phone]      Follow up with Lewayne Bunting, MD. (approx 10-14 days - we will arrange)    Contact information:   [address] [place] [address] [phone]          Discharge Medications: Current Discharge Medication List    START taking these medications   Details  enoxaparin (LOVENOX) 100 MG/ML SOLN Inject 0.9 mLs (90 mg total) into the skin every 12 (twelve) hours. Qty: 7 Syringe, Refills: 1    HYDROcodone-acetaminophen (NORCO) 5-325 MG per tablet Take 1 tablet by mouth every 6 (six) hours as needed. Qty: 30 tablet, Refills: 0      CONTINUE these medications which have CHANGED   Details  doxycycline (VIBRAMYCIN) 100 MG capsule  Take 1 capsule (100 mg total) by mouth 2 (  two) times daily. Qty: 60 capsule, Refills: 1  amiodarone (PACERONE) 200 MG tablet Take 1 tablet (200 mg total) by mouth daily. Qty: 30 tablet, Refills: 9    folic acid (FOLVITE) 1 MG tablet Take 1 mg by mouth daily.     Associated Diagnoses: Chronic systolic heart failure    Levothyroxine Sodium 75 MCG CAPS Take by mouth daily.      lisinopril (PRINIVIL,ZESTRIL) 20 MG tablet Take 20 mg by mouth daily.      Multiple Vitamins-Minerals (MULTIVITAL) tablet Take 1 tablet by mouth daily.      Omega-3 Fatty Acids (FISH OIL) 1000 MG CAPS Take by mouth daily.      warfarin (JANTOVEN) 5 MG tablet Take 5 mg by mouth daily. Takes 7.5 mg  on tue and sat      STOP taking these medications     amoxicillin (AMOXIL) 500 MG tablet         Outstanding Labs/Studies  Follow up INR in coumadin clinic later this week.  Duration of Discharge Encounter: Greater than 30 minutes including physician time.  Signed, Nicolasa Ducking NP 06/28/2011, 6:35 PM

## 2011-06-28 NOTE — Progress Notes (Signed)
Pt given discharge instructions, scripts and medication information.  Pt and wife verbalized understanding of instructions. Thomas Hoff

## 2011-06-28 NOTE — Progress Notes (Signed)
Pt request  Sleep aid. Theodore Demark, PA made aware.Orders given

## 2011-06-29 ENCOUNTER — Telehealth: Payer: Self-pay | Admitting: Internal Medicine

## 2011-06-29 DIAGNOSIS — Z952 Presence of prosthetic heart valve: Secondary | ICD-10-CM

## 2011-06-29 DIAGNOSIS — I4891 Unspecified atrial fibrillation: Secondary | ICD-10-CM

## 2011-06-29 NOTE — Telephone Encounter (Signed)
Will see him Friday at 9am  And will check his INR then also Per Dr Ladona Ridgel take one dose of Lovenox today and then 2 doses tomorrow and Thurs

## 2011-06-29 NOTE — Telephone Encounter (Signed)
Pt calling re "she already knows" he talked with you last night

## 2011-07-02 ENCOUNTER — Other Ambulatory Visit (INDEPENDENT_AMBULATORY_CARE_PROVIDER_SITE_OTHER): Payer: Medicare Other | Admitting: *Deleted

## 2011-07-02 ENCOUNTER — Ambulatory Visit (INDEPENDENT_AMBULATORY_CARE_PROVIDER_SITE_OTHER): Payer: Medicare Other | Admitting: *Deleted

## 2011-07-02 DIAGNOSIS — R0989 Other specified symptoms and signs involving the circulatory and respiratory systems: Secondary | ICD-10-CM

## 2011-07-02 DIAGNOSIS — I472 Ventricular tachycardia: Secondary | ICD-10-CM

## 2011-07-02 DIAGNOSIS — Z954 Presence of other heart-valve replacement: Secondary | ICD-10-CM

## 2011-07-02 DIAGNOSIS — I4891 Unspecified atrial fibrillation: Secondary | ICD-10-CM

## 2011-07-02 DIAGNOSIS — Z952 Presence of prosthetic heart valve: Secondary | ICD-10-CM

## 2011-07-02 LAB — PROTIME-INR
INR: 2 ratio — ABNORMAL HIGH (ref 0.8–1.0)
Prothrombin Time: 21.6 s — ABNORMAL HIGH (ref 10.2–12.4)

## 2011-07-06 ENCOUNTER — Ambulatory Visit (INDEPENDENT_AMBULATORY_CARE_PROVIDER_SITE_OTHER): Payer: Medicare Other | Admitting: *Deleted

## 2011-07-06 DIAGNOSIS — R0989 Other specified symptoms and signs involving the circulatory and respiratory systems: Secondary | ICD-10-CM

## 2011-07-06 DIAGNOSIS — I4891 Unspecified atrial fibrillation: Secondary | ICD-10-CM

## 2011-07-06 DIAGNOSIS — T148XXA Other injury of unspecified body region, initial encounter: Secondary | ICD-10-CM

## 2011-07-06 DIAGNOSIS — Z952 Presence of prosthetic heart valve: Secondary | ICD-10-CM

## 2011-07-06 DIAGNOSIS — Z954 Presence of other heart-valve replacement: Secondary | ICD-10-CM

## 2011-07-06 LAB — PROTIME-INR
INR: 1.9 ratio — ABNORMAL HIGH (ref 0.8–1.0)
Prothrombin Time: 20.6 s — ABNORMAL HIGH (ref 10.2–12.4)

## 2011-07-06 NOTE — Progress Notes (Signed)
Pt in for wound check. Dr Ladona Ridgel repacked wound and instructed pt's wife how to pack twice a week. Pt is going out of town on 07-08-11 and will return 07-24-11. Pt is scheduled for wound recheck on 08-03-11 and scheduled for implant on 08-05-11 attentively. Nu gauze and supplies given to pt for packing. Nu gauze prescription also sent to pharmacy. Pt had INR drawn today as well. Results to be faxed to [facility] at [facility].

## 2011-07-13 ENCOUNTER — Encounter: Payer: Self-pay | Admitting: *Deleted

## 2011-07-29 ENCOUNTER — Encounter (HOSPITAL_COMMUNITY): Payer: Self-pay | Admitting: Pharmacy Technician

## 2011-08-03 ENCOUNTER — Ambulatory Visit (INDEPENDENT_AMBULATORY_CARE_PROVIDER_SITE_OTHER): Payer: Medicare Other | Admitting: *Deleted

## 2011-08-03 DIAGNOSIS — R0989 Other specified symptoms and signs involving the circulatory and respiratory systems: Secondary | ICD-10-CM

## 2011-08-03 DIAGNOSIS — I472 Ventricular tachycardia: Secondary | ICD-10-CM

## 2011-08-03 DIAGNOSIS — R55 Syncope and collapse: Secondary | ICD-10-CM

## 2011-08-03 LAB — BASIC METABOLIC PANEL
Calcium: 9 mg/dL (ref 8.4–10.5)
Creatinine, Ser: 1.3 mg/dL (ref 0.4–1.5)
GFR: 60.36 mL/min (ref >60.00)
Sodium: 137 mEq/L (ref 135–145)

## 2011-08-03 LAB — CBC WITH DIFFERENTIAL/PLATELET
Basophils Absolute: 0 10*3/uL (ref 0.0–0.1)
Basophils Relative: 0.6 % (ref 0.0–3.0)
Hemoglobin: 13.9 g/dL (ref 13.0–17.0)
Lymphocytes Relative: 13.6 % (ref 12.0–46.0)
Monocytes Relative: 11.3 % (ref 3.0–12.0)
Neutro Abs: 4.8 10*3/uL (ref 1.4–7.7)
Neutrophils Relative %: 72.3 % (ref 43.0–77.0)
RBC: 4.6 Mil/uL (ref 4.22–5.81)
RDW: 15.6 % — ABNORMAL HIGH (ref 11.5–14.6)

## 2011-08-03 LAB — PROTIME-INR: INR: 2.4 ratio — ABNORMAL HIGH (ref 0.8–1.0)

## 2011-08-03 NOTE — Progress Notes (Signed)
Wound check only. Pt scheduled for implant on 08-05-11 per GT.

## 2011-08-05 ENCOUNTER — Ambulatory Visit (HOSPITAL_COMMUNITY)
Admission: RE | Admit: 2011-08-05 | Discharge: 2011-08-06 | Disposition: A | Discharge status: Home / Self Care | Payer: Medicare Other | Source: Ambulatory Visit | Attending: Internal Medicine | Admitting: Internal Medicine

## 2011-08-05 ENCOUNTER — Encounter (HOSPITAL_COMMUNITY)
Admission: RE | Disposition: A | Discharge status: Home / Self Care | Payer: Self-pay | Source: Ambulatory Visit | Attending: Internal Medicine

## 2011-08-05 DIAGNOSIS — Z95810 Presence of automatic (implantable) cardiac defibrillator: Secondary | ICD-10-CM | POA: Insufficient documentation

## 2011-08-05 DIAGNOSIS — I4891 Unspecified atrial fibrillation: Secondary | ICD-10-CM | POA: Insufficient documentation

## 2011-08-05 DIAGNOSIS — I472 Ventricular tachycardia, unspecified: Secondary | ICD-10-CM | POA: Insufficient documentation

## 2011-08-05 DIAGNOSIS — N529 Male erectile dysfunction, unspecified: Secondary | ICD-10-CM | POA: Insufficient documentation

## 2011-08-05 DIAGNOSIS — I4729 Other ventricular tachycardia: Secondary | ICD-10-CM | POA: Insufficient documentation

## 2011-08-05 DIAGNOSIS — I4510 Unspecified right bundle-branch block: Secondary | ICD-10-CM | POA: Insufficient documentation

## 2011-08-05 DIAGNOSIS — I428 Other cardiomyopathies: Secondary | ICD-10-CM | POA: Insufficient documentation

## 2011-08-05 DIAGNOSIS — H9190 Unspecified hearing loss, unspecified ear: Secondary | ICD-10-CM | POA: Insufficient documentation

## 2011-08-05 DIAGNOSIS — I441 Atrioventricular block, second degree: Secondary | ICD-10-CM | POA: Insufficient documentation

## 2011-08-05 DIAGNOSIS — I5022 Chronic systolic (congestive) heart failure: Secondary | ICD-10-CM

## 2011-08-05 DIAGNOSIS — E7800 Pure hypercholesterolemia, unspecified: Secondary | ICD-10-CM | POA: Insufficient documentation

## 2011-08-05 DIAGNOSIS — Z954 Presence of other heart-valve replacement: Secondary | ICD-10-CM | POA: Insufficient documentation

## 2011-08-05 DIAGNOSIS — G4730 Sleep apnea, unspecified: Secondary | ICD-10-CM | POA: Insufficient documentation

## 2011-08-05 HISTORY — PX: PERMANENT PACEMAKER INSERTION: SHX5480

## 2011-08-05 HISTORY — DX: Presence of prosthetic heart valve: Z95.2

## 2011-08-05 HISTORY — DX: Other cardiomyopathies: I42.8

## 2011-08-05 HISTORY — DX: Presence of automatic (implantable) cardiac defibrillator: Z95.810

## 2011-08-05 LAB — PROTIME-INR
INR: 2.64 — ABNORMAL HIGH (ref 0.00–1.49)
Prothrombin Time: 28.6 seconds — ABNORMAL HIGH (ref 11.6–15.2)

## 2011-08-05 LAB — SURGICAL PCR SCREEN
MRSA, PCR: NEGATIVE
Staphylococcus aureus: NEGATIVE

## 2011-08-05 SURGERY — PERMANENT PACEMAKER INSERTION
Anesthesia: LOCAL

## 2011-08-05 MED ORDER — WARFARIN SODIUM 5 MG PO TABS
5.0000 mg | ORAL_TABLET | Freq: Once | ORAL | Status: AC
  Administered 2011-08-05: 5 mg via ORAL
  Filled 2011-08-05: qty 1

## 2011-08-05 MED ORDER — VANCOMYCIN HCL IN DEXTROSE 1-5 GM/200ML-% IV SOLN
1000.0000 mg | Freq: Two times a day (BID) | INTRAVENOUS | Status: AC
  Administered 2011-08-05: 1000 mg via INTRAVENOUS
  Filled 2011-08-05: qty 200

## 2011-08-05 MED ORDER — SODIUM CHLORIDE 0.9 % IV SOLN: 250.0000 mL | INTRAVENOUS | Status: DC | PRN

## 2011-08-05 MED ORDER — HEPARIN (PORCINE) IN NACL 2-0.9 UNIT/ML-% IJ SOLN
INTRAMUSCULAR | Status: AC
  Filled 2011-08-05: qty 1000

## 2011-08-05 MED ORDER — LISINOPRIL 20 MG PO TABS
20.0000 mg | ORAL_TABLET | Freq: Every day | ORAL | Status: DC
  Filled 2011-08-05 (×2): qty 1

## 2011-08-05 MED ORDER — SODIUM CHLORIDE 0.9 % IJ SOLN: 3.0000 mL | INTRAMUSCULAR | Status: DC | PRN

## 2011-08-05 MED ORDER — CHLORHEXIDINE GLUCONATE 4 % EX LIQD
60.0000 mL | Freq: Once | CUTANEOUS | Status: DC
  Filled 2011-08-05: qty 60

## 2011-08-05 MED ORDER — ONDANSETRON HCL 4 MG/2ML IJ SOLN: 4.0000 mg | Freq: Four times a day (QID) | INTRAMUSCULAR | Status: DC | PRN

## 2011-08-05 MED ORDER — LEVOTHYROXINE SODIUM 75 MCG PO CAPS: 1.0000 | ORAL_CAPSULE | Freq: Every day | ORAL | Status: DC

## 2011-08-05 MED ORDER — SODIUM CHLORIDE 0.45 % IV SOLN: INTRAVENOUS | Status: DC

## 2011-08-05 MED ORDER — MIDAZOLAM HCL 5 MG/5ML IJ SOLN
INTRAMUSCULAR | Status: AC
  Filled 2011-08-05: qty 5

## 2011-08-05 MED ORDER — SODIUM CHLORIDE 0.9 % IV SOLN
INTRAVENOUS | Status: DC
  Administered 2011-08-05: 11:00:00 via INTRAVENOUS

## 2011-08-05 MED ORDER — VANCOMYCIN HCL IN DEXTROSE 1-5 GM/200ML-% IV SOLN
1000.0000 mg | INTRAVENOUS | Status: DC
  Filled 2011-08-05: qty 200

## 2011-08-05 MED ORDER — LEVOTHYROXINE SODIUM 75 MCG PO TABS
75.0000 ug | ORAL_TABLET | Freq: Every day | ORAL | Status: DC
  Administered 2011-08-06: 75 ug via ORAL
  Filled 2011-08-05 (×2): qty 1

## 2011-08-05 MED ORDER — LIDOCAINE HCL (PF) 1 % IJ SOLN
INTRAMUSCULAR | Status: AC
  Filled 2011-08-05: qty 60

## 2011-08-05 MED ORDER — FOLIC ACID 1 MG PO TABS
1.0000 mg | ORAL_TABLET | Freq: Every day | ORAL | Status: DC
  Administered 2011-08-05: 1 mg via ORAL
  Filled 2011-08-05 (×2): qty 1

## 2011-08-05 MED ORDER — VANCOMYCIN HCL IN DEXTROSE 1-5 GM/200ML-% IV SOLN
INTRAVENOUS | Status: AC
  Filled 2011-08-05: qty 200

## 2011-08-05 MED ORDER — AMIODARONE HCL 200 MG PO TABS
200.0000 mg | ORAL_TABLET | Freq: Every day | ORAL | Status: DC
  Administered 2011-08-06: 200 mg via ORAL
  Filled 2011-08-05: qty 1

## 2011-08-05 MED ORDER — FENTANYL CITRATE 0.05 MG/ML IJ SOLN
INTRAMUSCULAR | Status: AC
  Filled 2011-08-05: qty 2

## 2011-08-05 MED ORDER — SODIUM CHLORIDE 0.9 % IR SOLN
80.0000 mg | Status: DC
  Filled 2011-08-05: qty 2

## 2011-08-05 MED ORDER — MUPIROCIN 2 % EX OINT
TOPICAL_OINTMENT | CUTANEOUS | Status: AC
  Administered 2011-08-05: 1
  Filled 2011-08-05: qty 22

## 2011-08-05 MED ORDER — SODIUM CHLORIDE 0.9 % IJ SOLN
3.0000 mL | Freq: Two times a day (BID) | INTRAMUSCULAR | Status: DC
  Administered 2011-08-05 – 2011-08-06 (×2): 3 mL via INTRAVENOUS

## 2011-08-05 MED ORDER — WARFARIN SODIUM 5 MG PO TABS: 5.0000 mg | ORAL_TABLET | ORAL | Status: DC

## 2011-08-05 MED ORDER — MUPIROCIN 2 % EX OINT
TOPICAL_OINTMENT | Freq: Two times a day (BID) | CUTANEOUS | Status: DC
  Filled 2011-08-05: qty 22

## 2011-08-05 NOTE — H&P (Signed)
Austin [name] is an 71 y.o. male.   Chief Complaint: Admit for ICD insertion HPI: [name] is a pleasant 71 yo man with a DCM, VT, s/p MVR who developed a chronic ICD system infection and underwent extraction 6 weeks ago. He developed a large hematoma and wound dehiscience and had an external ICD prescribed. His wound has healed by secondary intention and he has no additional evidence of active infection and is now referred for insertion of a new ICD system.   Past Medical History  Diagnosis Date  . Unspecified cardiovascular disease   . Pure hypercholesterolemia   . Psychosexual dysfunction with inhibited sexual excitement   . Conductive hearing loss   . Syncope and collapse   . Atrial fibrillation   . Intraspinal abscess   . Mitral valve insufficiency and aortic valve insufficiency   . Personal history of surgery to heart and great vessels, presenting hazards to health   . Unspecified sleep apnea     last sleep study 11/07  . Ventricular tachycardia     s/p biVicd - extracted 06/25/11 2/2 infection    Past Surgical History  Procedure Date  . Mitral valve replacement     w #[address] [facility]  . Valve replacement 2000  . Evacution of epidural lumbar epidural abscess 1999  . Thyroidectomy   . Tonsillectomy   . Dental implants   . Cardiac catheterization     doesn't remember when  . Insert / replace / remove pacemaker 11/2008    No family history on file. Social History:  reports that he has never smoked. He does not have any smokeless tobacco history on file. He reports that he drinks alcohol. He reports that he does not use illicit drugs.  Allergies: No Known Allergies  No current facility-administered medications on file as of .   Medications Prior to Admission  Medication Sig Dispense Refill  . amiodarone (PACERONE) 200 MG tablet Take 1 tablet (200 mg total) by mouth daily.  30 tablet  9  . folic acid (FOLVITE) 1 MG tablet Take 1 mg by mouth daily.       .  Levothyroxine Sodium 75 MCG CAPS Take 1 capsule by mouth daily.       Marland Kitchen lisinopril (PRINIVIL,ZESTRIL) 20 MG tablet Take 20 mg by mouth daily.       . Multiple Vitamins-Minerals (MULTIVITAL) tablet Take 1 tablet by mouth daily.       . Omega-3 Fatty Acids (FISH OIL) 1000 MG CAPS Take 1 capsule by mouth daily.       Marland Kitchen warfarin (JANTOVEN) 5 MG tablet Take 5 mg by mouth as directed. Takes 7.5 mg on tue and sat, 5mg  all other days        Results for orders placed in visit on 08/03/11 (from the past 48 hour(s))  BASIC METABOLIC PANEL     Status: Abnormal   Collection Time   08/03/11 10:07 AM      Component Value Range Comment   Sodium 137  135 - 145 (mEq/L)    Potassium 4.6  3.5 - 5.1 (mEq/L)    Chloride 105  96 - 112 (mEq/L)    CO2 25  19 - 32 (mEq/L)    Glucose, Bld 81  70 - 99 (mg/dL)    BUN 28 (*) 6 - 23 (mg/dL)    Creatinine, Ser 1.3  0.4 - 1.5 (mg/dL)    Calcium 9.0  8.4 - 10.5 (mg/dL)    GFR 16.10  >  60.00 (mL/min)   CBC WITH DIFFERENTIAL     Status: Abnormal   Collection Time   08/03/11 10:07 AM      Component Value Range Comment   WBC 6.7  4.5 - 10.5 (K/uL)    RBC 4.60  4.22 - 5.81 (Mil/uL)    Hemoglobin 13.9  13.0 - 17.0 (g/dL)    HCT 29.5  28.4 - 13.2 (%)    MCV 88.3  78.0 - 100.0 (fl)    MCHC 34.1  30.0 - 36.0 (g/dL)    RDW 44.0 (*) 10.2 - 14.6 (%)    Platelets 169.0  150.0 - 400.0 (K/uL)    Neutrophils Relative 72.3  43.0 - 77.0 (%)    Lymphocytes Relative 13.6  12.0 - 46.0 (%)    Monocytes Relative 11.3  3.0 - 12.0 (%)    Eosinophils Relative 2.2  0.0 - 5.0 (%)    Basophils Relative 0.6  0.0 - 3.0 (%)    Neutro Abs 4.8  1.4 - 7.7 (K/uL)    Lymphs Abs 0.9  0.7 - 4.0 (K/uL)    Monocytes Absolute 0.8  0.1 - 1.0 (K/uL)    Eosinophils Absolute 0.1  0.0 - 0.7 (K/uL)    Basophils Absolute 0.0  0.0 - 0.1 (K/uL)   PROTIME-INR     Status: Abnormal   Collection Time   08/03/11 10:07 AM      Component Value Range Comment   Prothrombin Time 26.6 (*) 10.2 - 12.4 (sec)    INR 2.4  (*) 0.8 - 1.0 (ratio)    No results found.  PE: Well appearing middle aged man, NAD HEENT: Unremarkable Neck:  No JVD, no thyromegally Lymphatics:  No adenopathy Back:  No CVA tenderness Lungs:  Clear with no wheezes, rales, or rhonchi. Healing left sided ICD incision. HEART:  Regular rate rhythm, no murmurs, no rubs, no clicks Abd:  Flat, positive bowel sounds, no organomegally, no rebound, no guarding Ext:  2 plus pulses, no edema, no cyanosis, no clubbing Skin:  No rashes no nodules Neuro:  CN II through XII intact, motor grossly intact   Assessment/Plan 1. DCM 2. VT with syncope on amiodarone. 3. S/p ICD system extraction 4. Chronic anti-coagulation secondary to a mechanical mitral valve. I have discussed the treatment options with the patient and the risks/benefits/goals/expectations of the procedure have been discussed with the patient and he wishes to proceed.  Austin [name] 08/05/2011, 7:33 AM

## 2011-08-05 NOTE — Op Note (Signed)
BiV ICD implant via right subclavian vein without immediate complication. W#[id number].

## 2011-08-05 NOTE — Progress Notes (Signed)
ANTICOAGULATION CONSULT NOTE - Initial Consult  Pharmacy Consult for Warfarin Indication: VT, PAF, St. Jude MVR   No Known Allergies  Patient Measurements: Height: 6\' 2"  (188 cm) Weight: 200 lb (90.719 kg) IBW/kg (Calculated) : 82.2    Vital Signs: Temp: 97.5 F (36.4 C) (01/10 2000) Temp src: Oral (01/10 2000) BP: 86/46 mmHg (01/10 2000) Pulse Rate: 55  (01/10 2000)  Labs:  Basename 08/05/11 1046 08/03/11 1007  HGB -- 13.9  HCT -- 40.6  PLT -- 169.0  APTT -- --  LABPROT 28.6* 26.6*  INR 2.64* 2.4*  HEPARINUNFRC -- --  CREATININE -- 1.3  CKTOTAL -- --  CKMB -- --  TROPONINI -- --   Estimated Creatinine Clearance: 63.2 ml/min (by C-G formula based on Cr of 1.3).  Medical History: Past Medical History  Diagnosis Date  . Unspecified cardiovascular disease   . Pure hypercholesterolemia   . Psychosexual dysfunction with inhibited sexual excitement   . Conductive hearing loss   . Syncope and collapse   . Atrial fibrillation   . Intraspinal abscess   . Mitral valve insufficiency and aortic valve insufficiency   . Personal history of surgery to heart and great vessels, presenting hazards to health   . Unspecified sleep apnea     last sleep study 11/07  . Ventricular tachycardia     s/p biVicd - extracted 06/25/11 2/2 infection    Medications:  Prescriptions prior to admission  Medication Sig Dispense Refill  . amiodarone (PACERONE) 200 MG tablet Take 1 tablet (200 mg total) by mouth daily.  30 tablet  9  . folic acid (FOLVITE) 1 MG tablet Take 1 mg by mouth daily.       . Levothyroxine Sodium 75 MCG CAPS Take 1 capsule by mouth daily.       Marland Kitchen lisinopril (PRINIVIL,ZESTRIL) 20 MG tablet Take 20 mg by mouth daily.       . Multiple Vitamins-Minerals (MULTIVITAL) tablet Take 1 tablet by mouth daily.       . Omega-3 Fatty Acids (FISH OIL) 1000 MG CAPS Take 1 capsule by mouth daily.       Marland Kitchen warfarin (JANTOVEN) 5 MG tablet Take 5 mg by mouth as directed. Takes 7.5 mg  on tue and sat, 5mg  all other days        Assessment: 71 yo admitted for BiV ICD implant - to resume Warfarin therapy.  INR was drawn today and indicates a therapeutic response to current regimen of 5 mg PO Warfarin every day except on Tuesday and Saturday for which he takes 7.5mg  on those days.  I spoke with patient and he confirms that his last dose was on Tuesday evening.  He states he has not had any bleeding issues.  Goal of Therapy:  INR 2.5-3.5   Plan:  Will continue his home regimen and give Warfarin 5mg  tonight. F/U AM PT/INR and adjust if needed.   Nadara Mustard, PharmD., MS Clinical Pharmacist Pager:  [phone] 08/05/2011,8:41 PM

## 2011-08-06 ENCOUNTER — Encounter (HOSPITAL_COMMUNITY): Payer: Self-pay | Admitting: Internal Medicine

## 2011-08-06 ENCOUNTER — Other Ambulatory Visit: Payer: Self-pay

## 2011-08-06 ENCOUNTER — Ambulatory Visit (HOSPITAL_COMMUNITY): Payer: Medicare Other

## 2011-08-06 LAB — PROTIME-INR: Prothrombin Time: 22.9 seconds — ABNORMAL HIGH (ref 11.6–15.2)

## 2011-08-06 MED ORDER — WARFARIN SODIUM 7.5 MG PO TABS
7.5000 mg | ORAL_TABLET | Freq: Once | ORAL | Status: DC
  Filled 2011-08-06: qty 1

## 2011-08-06 NOTE — Op Note (Signed)
NAMEXZAYVION, VAETH NO.:  [id number]  MEDICAL RECORD NO.:  [id number]  LOCATION:  3707                         FACILITY:  [facility]  PHYSICIAN:  Doylene Canning. Ladona Ridgel, MD    DATE OF BIRTH:  July 18, 1941  DATE OF PROCEDURE:  08/05/2011 DATE OF DISCHARGE:                              OPERATIVE REPORT   PROCEDURE PERFORMED:  Insertion of a biventricular implantable cardioverter defibrillator.  INDICATION:  Nonischemic cardiomyopathy, chronic systolic heart failure, high-grade atrioventricular block, and ventricular tachycardia.  INTRODUCTION:  The patient is a 71 year old man with an extensive past medical history.  He has a history of mitral valve regurgitation and underwent mitral valve replacement years ago.  He developed progressive conduction system disease, and worsening heart failure, and underwent insertion of a BiV pacemaker approximately 3 years ago.  The patient developed ventricular tachycardia with syncope and underwent upgrade to a BiV ICD 2 years ago.  The patient had recurrent ventricular tachycardia and was placed on amiodarone.  He developed swelling and erythema around his ICD pocket and despite antibiotic therapy, his swelling and erythema persisted.  He underwent extraction of his ICD system as well as a retained Medtronic StarFix active fixation LV pacing lead, approximately 7 weeks ago.  He is now referred for insertion of a new device.  Of note, my initial plan for this patient was to place a dual-chamber pacemaker because his QRS duration was not particularly long and he appeared to be for the most part conducting one-to-one. 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  PROCEDURE:  After informed consent was obtained, the patient was taken to the diagnostic EP lab in a fasting state.   After usual preparation and draping, intravenous fentanyl and midazolam was given for sedation. Lidocaine 30 mL was infiltrated into the right infraclavicular region. A 7-cm incision was carried out over this region, and initial attempts to cannulate the right subclavian vein were unsuccessful, and a venogram was carried out.  This resulted in the venous anatomy to be slightly superiorly oriented and after venography was carried out, the vein was punctured x3.  The Medtronic model [id number], 58-cm active fixation single coil defibrillation lead, serial # [id number] was advanced into the right ventricle and  mapping was carried out.  At the final site on the RV apical septum, the R-waves measured 9 mV, the pacing impedance was 475 ohms, and the threshold was 0.5 V at 0.5 msec.  10 V pacing did not stimulate the diaphragm and there was a large injury current present. With the ventricular lead in satisfactory position, attention was then turned to placement of the atrial lead, which was placed in the anterolateral portion of the right atrium where P-waves measured 1.5 mV initially.  The pacing impedance was 456 ohms.  The threshold was a V at 0.5 msec.  With these satisfactory parameters, attention was then turned to placement of a new left ventricular lead.  The coronary sinus guiding catheter along with a 6-French hexapolar EP catheter were inserted through the  right subclavian vein and advanced into the right atrium. The coronary sinus was cannulated with a modest amount of difficulty. Venography of the coronary sinus was carried out.  This demonstrated a middle cardiac vein, as well as a lateral vein which had previously been used for LV pacing.  The lateral vein was selected for the LV lead placement.  With some difficulty requiring an inner catheter, the vein was cannulated.  The Luge 0.014 guidewire was advanced along with the Medtronic model 4196, passive fixation LV pacing lead, serial  number [id number] being advanced into the lateral vein.  The patient's vein was fairly small in its mid and distal portions.  That said, the lead could be advanced all the way to the distal two-thirds of the lead two-thirds from base to apex.  In this location, diaphragmatic stimulation was present and the pacing thresholds were high.  The lead was subsequently withdrawn back towards the base of the heart and throughout the course of the LV lateral lead, the diaphragmatic pacing threshold was between 4 and 5 V, and the pacing threshold was above 5 V.  At the final site on the basilar portion of the lateral vein, there was intermittent capture at 3 V.  At other times, the capture was higher.  Diaphragmatic stimulation was not present.  The only pacing configuration that would work was from the LV ring to the RV coil.  Otherwise, diaphragmatic stimulation was seen.  Because there were no additional options for LV pacing at this point, it was deemed most appropriate to leave the lead in this position and the guiding catheter was liberated from the lead in the usual manner.  The leads were secured to the subpectoral fascia with a figure-of-eight silk suture, and the sewing sleeve was secured with silk suture.  Electrocautery was utilized to make a subcutaneous pocket. The sewing sleeves were all secured with silk suture.  Electrocautery was utilized to assure hemostasis.  Medtronic BiV ICD, serial number [id number] was connected to the atrial RV and LV leads and placed back in the subcutaneous pocket.  The pocket was irrigated with additional antibiotic irrigation, and the incision was closed with 2-0 and 3-0 Vicryl.  Benzoin and Steri-Strips were painted on the skin, and at this point, I scrubbed out of the case to supervise defibrillation threshold testing.  After the patient was more deeply sedated under my supervision with intravenous fentanyl and midazolam, ventricular fibrillation  was initiated with a T-wave shock.  A 20 joules shock was then delivered, which terminated ventricular fibrillation and restored sinus rhythm.  At this point, no additional defibrillation threshold testing was carried out, and the incision was closed as previously noted.  A pressure dressing was placed, and the patient was returned to his room in satisfactory condition.  COMPLICATIONS:  There were no immediate procedure complications.  RESULTS:  This demonstrates successful implantation of a Medtronic BiV ICD in a patient with ventricular tachycardia with syncope, nonischemic cardiomyopathy, chronic systolic heart failure.  Of note, the patient's LV pacing threshold was elevated at the time of implant despite multiple attempts at multiple sites to satisfactorily obtain LV pacing.     Doylene Canning. Ladona Ridgel, MD     GWT/MEDQ  D:  08/05/2011  T:  08/06/2011  Job:  [id number]  cc:   Lyn Records, M.D.

## 2011-08-06 NOTE — Progress Notes (Signed)
ANTICOAGULATION CONSULT NOTE - Follow Up Consult  Pharmacy Consult:  Warfarin Indication: VT, PAF, St. Jude MVR   No Known Allergies  Patient Measurements: Height: 6\' 2"  (188 cm) Weight: 200 lb (90.719 kg) IBW/kg (Calculated) : 82.2    Vital Signs: Temp: 97.5 F (36.4 C) (01/11 0400) Temp src: Oral (01/11 0400) BP: 115/64 mmHg (01/11 0400) Pulse Rate: 61  (01/11 0400)  Labs:  Basename 08/06/11 0640 08/05/11 1046 08/03/11 1007  HGB -- -- 13.9  HCT -- -- 40.6  PLT -- -- 169.0  APTT -- -- --  LABPROT 22.9* 28.6* 26.6*  INR 1.99* 2.64* 2.4*  HEPARINUNFRC -- -- --  CREATININE -- -- 1.3  CKTOTAL -- -- --  CKMB -- -- --  TROPONINI -- -- --   Estimated Creatinine Clearance: 63.2 ml/min (by C-G formula based on Cr of 1.3).  Medical History: Past Medical History  Diagnosis Date  . Nonischemic cardiomyopathy   . 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 Psychosexual dysfunction with inhibited sexual excitement   . Conductive hearing loss   . Syncope and collapse   . Atrial fibrillation   . Intraspinal abscess   . Mitral valve insufficiency and aortic valve insufficiency     s/p MVR mechanical  . S/P mitral valve replacement   . Unspecified sleep apnea     last sleep study 11/07  . Ventricular tachycardia     Medications:  Prescriptions prior to admission  Medication Sig Dispense Refill  . amiodarone (PACERONE) 200 MG tablet Take 1 tablet (200 mg total) by mouth daily.  30 tablet  9  . folic acid (FOLVITE) 1 MG tablet Take 1 mg by mouth daily.       . Levothyroxine Sodium 75 MCG CAPS Take 1 capsule by mouth daily.       Marland Kitchen lisinopril (PRINIVIL,ZESTRIL) 20 MG tablet Take 20 mg by mouth daily.       . Multiple Vitamins-Minerals (MULTIVITAL) tablet Take 1 tablet by mouth daily.       . Omega-3 Fatty Acids (FISH OIL) 1000 MG CAPS Take 1 capsule by mouth daily.       Marland Kitchen warfarin (JANTOVEN) 5  MG tablet Take 5 mg by mouth as directed. Takes 7.5 mg on tue and sat, 5mg  all other days        Assessment: Austin [name] admitted for BiV ICD implant to continue on Coumadin for h/o PAF and St. Jude MVR.  INR slightly below goal today.  No bleeding noted.  Pharmacist System-Based Medication Review: Anticoagulation:  PAF + St. Jude MVR on Coumadin, INR 1.99 Cardiovascular:  S/p CRT-D implantation:  VSS - ACEi, amiodarone Endocrinology:  Hypothyroid on Synthroid, no TSH Gastrointestinal / Nutrition:  Heart healthy diet - no issues Nephrology:  CrCL 63 ml/min, lytes WNL Pulmonary:  95% on RA, no issues Hematology / Oncology:  H/H/plts WNL Best Practices:  Coumadin  Home Coumadin dose:  5mg  PO daily except 7.5mg  Tues and Sat  Goal of Therapy:  INR 2.5-3.5   Plan:  - Coumadin 7.5mg  PO today - Daily INR   Phillips Climes, PharmD 08/06/2011,9:22 AM

## 2011-08-06 NOTE — Discharge Summary (Signed)
CARDIOLOGY DISCHARGE SUMMARY   Patient ID: DANI DANIS MRN: [id number] DOB/AGE: September 18, 1940 71 y.o.  Admit date: 08/05/2011 Discharge date: 08/06/2011  Primary Discharge Diagnosis:Nonischemic cardiomyopathy, chronic systolic heart failure,  high-grade atrioventricular block, and ventricular tachycardia.    Secondary Discharge Diagnosis:  Patient Active Problem List  Diagnoses  . HYPERCHOLESTEROLEMIA  . ERECTILE DYSFUNCTION  . ABSCESS, SPINE, EPIDURAL  . HEARING LOSS  . MITRAL REGURGITATION, SEVERE  . VENTRICULAR TACHYCARDIA  . ATRIAL FIBRILLATION, CHRONIC  . Chronic systolic heart failure  . LEFT VENTRICULAR FUNCTION, DECREASED  . SYNCOPE  . SLEEP APNEA  . MITRAL VALVE REPLACEMENT, HX OF  . AUTOMATIC IMPLANTABLE CARDIAC DEFIBRILLATOR SITU  . Cardiac device, implant, or graft infection or inflammation  . Cardiomyopathy    Procedures: Insertion of a biventricular implantable cardioverter defibrillator. Medtronic BiV ICD, serial number PFS [id number],    Hospital Course: Mr. Releford had undergone IV implantation with at some point the insertion of a new LV lead. He developed an infection and underwent explantation November 2012 with removal of the previously implanted by the system as well as the previously capped LV lead. The interim he has been wearing a lifetest. He had developed a large hematoma and wound dehiscience but his wound has healed by secondary intention and he has no additional evidence of active infection. He was seen by Dr. Ladona Ridgel and scheduled for insertion of a biventricular ICD. He came to the hospital for the procedure on 08/05/2011.  Mr. Streater had a Medtronic biventricular ICD inserted without immediate complication. On 08/06/2011, he was seen by Dr. Ladona Ridgel. His chest x-ray showed no pneumothorax or new abnormality. He was ambulating without chest pain or shortness of breath and is considered stable for discharge, to followup as an outpatient.   Labs:     Lab Results  Component Value Date   WBC 6.7 08/03/2011   HGB 13.9 08/03/2011   HCT 40.6 08/03/2011   MCV 88.3 08/03/2011   PLT 169.0 08/03/2011    Lab 08/03/11 1007  NA 137  K 4.6  CL 105  CO2 25  BUN 28*  CREATININE 1.3  CALCIUM 9.0  PROT --  BILITOT --  ALKPHOS --  ALT --  AST --  GLUCOSE 81    Radiology: CHEST - 2 VIEW  Comparison: 06/25/2011  Findings: AICD noted with leads projecting over the right atrium, right ventricle, and in the vicinity of the coronary sinus. Prosthetic mitral valve noted.  No pneumothorax observed. No pleural effusion is identified. The lungs appear clear.  Cardiac and mediastinal contours appear unremarkable.  IMPRESSION:  1. AICD noted without complicating feature observed.  FOLLOW UP PLANS AND APPOINTMENTS Discharge Orders    Future Appointments: Provider: Department: Dept Phone: Center:   08/26/2011 9:35 AM Joice Lofts Caryl Bis, RN [facility] [phone] [facility]  Coumadin check next week   Wound check at [facility] on January 24, 10 AM   Dr. Lewayne Bunting on February 11, noon ;; Dr. Verdis Prime, as needed    Current Discharge Medication List    CONTINUE these medications which have NOT CHANGED   Details  amiodarone (PACERONE) 200 MG tablet Take 1 tablet (200 mg total) by mouth daily. Qty: 30 tablet, Refills: 9    folic acid (FOLVITE) 1 MG tablet Take 1 mg by mouth daily.    Associated Diagnoses: Chronic systolic heart failure    Levothyroxine Sodium 75 MCG CAPS Take 1 capsule by mouth daily.     lisinopril (PRINIVIL,ZESTRIL) 20  MG tablet Take 20 mg by mouth daily.     Multiple Vitamins-Minerals (MULTIVITAL) tablet Take 1 tablet by mouth daily.     Omega-3 Fatty Acids (FISH OIL) 1000 MG CAPS Take 1 capsule by mouth daily.     warfarin (JANTOVEN) 5 MG tablet Take 5 mg by mouth as directed. Takes 7.5 mg on tue and sat, 5mg  all other days         BRING ALL MEDICATIONS WITH YOU TO FOLLOW UP APPOINTMENTS  Time spent with  patient to include physician time: Signed: Theodore Demark 08/06/2011, 11:20 AM Co-Sign MD

## 2011-08-06 NOTE — Progress Notes (Signed)
  Patient Name: Austin [name]      SUBJECTIVE: The patient is status post CRT-D implantation. He had undergone IV implantation with at some point the insertion of a new LV lead. He developed an infection and underwent explantation November 2012 with removal of the previously implanted by the system as well as the previously capped LV lead. The interim he has been wearing a lifetest. Yesterday he underwent device implantation Past Medical History  Diagnosis Date  . Nonischemic cardiomyopathy   . 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 Psychosexual dysfunction with inhibited sexual excitement   . Conductive hearing loss   . Syncope and collapse   . Atrial fibrillation   . Intraspinal abscess   . Mitral valve insufficiency and aortic valve insufficiency     s/p MVR mechanical  . S/P mitral valve replacement   . Unspecified sleep apnea     last sleep study 11/07  . Ventricular tachycardia    Past Surgical History  Procedure Date  . Mitral valve replacement     w #[address] [facility] [address]  . Valve replacement 2000  . Evacution of epidural lumbar epidural abscess 1999  . Thyroidectomy   . Tonsillectomy   . Dental implants   . Cardiac catheterization     doesn't remember when  . Insert / replace / remove pacemaker 11/2008    PHYSICAL EXAM Filed Vitals:   08/05/11 2000 08/05/11 2221 08/06/11 0001 08/06/11 0400  BP: 86/46 101/53 107/65 115/64  Pulse: 55  76 61  Temp: 97.5 F (36.4 C)  97.5 F (36.4 C) 97.5 F (36.4 C)  TempSrc: Oral  Oral Oral  Resp: 20  20 20   Height:      Weight:      SpO2: 93%  95% 95%    Well developed and nourished in no acute distress HENT normal Neck supple with JVP-flat Carotids brisk and full without bruits Wound healing by 2o intention on L Pock[place] without hematoma on R Clear Regular rate and rhythm, no rub Abd-soft with active BS without hepatomegaly No Clubbing  cyanosis edema Skin-warm and dry A & Oriented  Grossly normal sensory and motor function        Intake/Output Summary (Last 24 hours) at 08/06/11 0821 Last data filed at 08/06/11 0030  Gross per 24 hour  Intake      0 ml  Output    925 ml  Net   -925 ml    LABS: Basic Metabolic Panel:  Lab 08/03/11 1610  NA 137  K 4.6  CL 105  CO2 25  GLUCOSE 81  BUN 28*  CREATININE 1.3  CALCIUM 9.0  MG --  PHOS --   Cardiac Enzymes: No results found for this basename: CKTOTAL:3,CKMB:3,CKMBINDEX:3,TROPONINI:3 in the last 72 hours CBC:  Lab 08/03/11 1007  WBC 6.7  NEUTROABS 4.8  HGB 13.9  HCT 40.6  MCV 88.3  PLT 169.0   PROTIME:  Basename 08/06/11 0640 08/05/11 1046 08/03/11 1007  LABPROT 22.9* 28.6* 26.6*  INR 1.99* 2.64* 2.4*     ASSESSMENT AND PLAN:  S/p CRT implant  Instructions give F/u wound clinic 2wk F/u GT 6 weeks Resume coumadin  Patient Active Hospital Problem List: No active hospital problems.    Signed, Sherryl Manges MD  08/06/2011

## 2011-08-09 ENCOUNTER — Telehealth: Payer: Self-pay | Admitting: Internal Medicine

## 2011-08-09 NOTE — Telephone Encounter (Signed)
Pt calling re recent surgery, has question

## 2011-08-09 NOTE — Telephone Encounter (Signed)
Called patient back and spoke with him  He had a few questions  Dr Graciela Husbands said he would be seen in 10 days for wound check  He has an appointment for 08/19/11 and wants to move it to 08/16/11

## 2011-08-16 ENCOUNTER — Ambulatory Visit (INDEPENDENT_AMBULATORY_CARE_PROVIDER_SITE_OTHER): Payer: Medicare Other | Admitting: *Deleted

## 2011-08-16 ENCOUNTER — Encounter: Payer: Self-pay | Admitting: Internal Medicine

## 2011-08-16 DIAGNOSIS — I428 Other cardiomyopathies: Secondary | ICD-10-CM

## 2011-08-16 DIAGNOSIS — I5022 Chronic systolic (congestive) heart failure: Secondary | ICD-10-CM

## 2011-08-16 LAB — ICD DEVICE OBSERVATION
AL IMPEDENCE ICD: 513 Ohm
CHARGE TIME: 3.603 s
HV IMPEDENCE: 65 Ohm
LV LEAD IMPEDENCE ICD: 418 Ohm
PACEART VT: 0
TOT-0001: 1
TOT-0002: 0
TOT-0006: 20130110000000
TZAT-0001FASTVT: 1
TZAT-0002ATACH: NEGATIVE
TZAT-0002ATACH: NEGATIVE
TZAT-0002ATACH: NEGATIVE
TZAT-0018ATACH: NEGATIVE
TZAT-0019ATACH: 6 V
TZAT-0019ATACH: 6 V
TZAT-0019SLOWVT: 8 V
TZAT-0020ATACH: 1.5 ms
TZAT-0020FASTVT: 1.5 ms
TZAT-0020SLOWVT: 1.5 ms
TZON-0003SLOWVT: 360 ms
TZON-0003VSLOWVT: 450 ms
TZON-0004SLOWVT: 16
TZON-0005SLOWVT: 12
TZST-0001ATACH: 5
TZST-0001FASTVT: 2
TZST-0001FASTVT: 3
TZST-0001FASTVT: 4
TZST-0001SLOWVT: 2
TZST-0001SLOWVT: 3
TZST-0001SLOWVT: 5
TZST-0002ATACH: NEGATIVE
TZST-0002ATACH: NEGATIVE
TZST-0002FASTVT: NEGATIVE
TZST-0002FASTVT: NEGATIVE
TZST-0002SLOWVT: NEGATIVE
VENTRICULAR PACING ICD: 0.15 pct

## 2011-08-16 NOTE — Progress Notes (Signed)
Wound check-ICD 

## 2011-08-19 ENCOUNTER — Ambulatory Visit: Payer: Medicare Other | Admitting: *Deleted

## 2011-08-26 ENCOUNTER — Encounter: Payer: Medicare Other | Admitting: *Deleted

## 2011-09-06 ENCOUNTER — Encounter: Payer: Self-pay | Admitting: Internal Medicine

## 2011-09-06 ENCOUNTER — Ambulatory Visit (INDEPENDENT_AMBULATORY_CARE_PROVIDER_SITE_OTHER): Payer: Medicare Other | Admitting: Internal Medicine

## 2011-09-06 VITALS — BP 106/68 | HR 65 | Ht 74.0 in | Wt 202.8 lb

## 2011-09-06 DIAGNOSIS — I472 Ventricular tachycardia: Secondary | ICD-10-CM

## 2011-09-06 DIAGNOSIS — I5022 Chronic systolic (congestive) heart failure: Secondary | ICD-10-CM

## 2011-09-06 DIAGNOSIS — Z95810 Presence of automatic (implantable) cardiac defibrillator: Secondary | ICD-10-CM

## 2011-09-06 DIAGNOSIS — I4891 Unspecified atrial fibrillation: Secondary | ICD-10-CM

## 2011-09-06 NOTE — Patient Instructions (Signed)
Your physician wants you to follow-up in: 6 months with Dr. Taylor. You will receive a reminder letter in the mail two months in advance. If you don't receive a letter, please call our office to schedule the follow-up appointment.  Your physician recommends that you continue on your current medications as directed. Please refer to the Current Medication list given to you today.  

## 2011-09-07 ENCOUNTER — Encounter: Payer: Self-pay | Admitting: Internal Medicine

## 2011-09-07 NOTE — Assessment & Plan Note (Signed)
His device is working normally. We'll plan to recheck in several months. 

## 2011-09-07 NOTE — Assessment & Plan Note (Signed)
He has had no recurrent symptoms. He will continue low-dose amiodarone.

## 2011-09-07 NOTE — Progress Notes (Signed)
HPI Austin [name] returns today for followup. He is a pleasant 71 year old man with a nonischemic cardiomyopathy, ventricular tachycardia, status post ICD implantation. 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 He underwent extraction of his entire system and contralateral ICD implantation. He continues to do well. He denies chest pain or shortness of breath. No peripheral edema. He wonders if he can return to vigorous tennis and golf. No Known Allergies   Current Outpatient Prescriptions  Medication Sig Dispense Refill  . amiodarone (PACERONE) 200 MG tablet Take 1 tablet (200 mg total) by mouth daily.  30 tablet  9  . folic acid (FOLVITE) 1 MG tablet Take 1 mg by mouth daily.       . Levothyroxine Sodium 75 MCG CAPS Take 1 capsule by mouth daily.       Marland Kitchen lisinopril (PRINIVIL,ZESTRIL) 20 MG tablet Take 20 mg by mouth daily.       . Multiple Vitamins-Minerals (MULTIVITAL) tablet Take 1 tablet by mouth daily.       . Omega-3 Fatty Acids (FISH OIL) 1000 MG CAPS Take 1 capsule by mouth daily.       Marland Kitchen warfarin (JANTOVEN) 5 MG tablet Take 5 mg by mouth as directed. Takes 7.5 mg on tue and sat, 5mg  all other days         Past Medical History  Diagnosis Date  . Nonischemic cardiomyopathy   . 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 Psychosexual dysfunction with inhibited sexual excitement   . Conductive hearing loss   . Syncope and collapse   . Atrial fibrillation   . Intraspinal abscess   . Mitral valve insufficiency and aortic valve insufficiency     s/p MVR mechanical  . S/P mitral valve replacement   . Unspecified sleep apnea     last sleep study 11/07  . Ventricular tachycardia     ROS:   All systems reviewed and negative except as noted in the HPI.   Past Surgical History  Procedure Date  . Mitral valve replacement     w #[address] [facility]  . Valve replacement 2000  . Evacution  of epidural lumbar epidural abscess 1999  . Thyroidectomy   . Tonsillectomy   . Dental implants   . Cardiac catheterization 04/24/2002  . Insert / replace / remove pacemaker 11/2008     No family history on file.   History   Social History  . Marital Status: Married    Spouse Name: N/A    Number of Children: N/A  . Years of Education: N/A   Occupational History  . Not on file.   Social History Main Topics  . Smoking status: Never Smoker   . Smokeless tobacco: Not on file  . Alcohol Use: Yes     occasionally  . Drug Use: No  . Sexually Active: Not on file   Other Topics Concern  . Not on file   Social History Narrative  . No narrative on file     BP 106/68  Pulse 65  Ht 6\' 2"  (1.88 m)  Wt 91.989 kg (202 lb 12.8 oz)  BMI 26.04 kg/m2  SpO2 95%  Physical Exam:  Well appearing NAD HEENT: Unremarkable Neck:  No JVD, no thyromegally Lungs:  Clear with no wheezes, rales, or rhonchi. Well-healed ICD incision on the right well-healed ICD incision on the left. HEART:  Regular rate rhythm, no murmurs, no  rubs, no clicks. Mechanical S1 closure present. Abd:  soft, positive bowel sounds, no organomegally, no rebound, no guarding Ext:  2 plus pulses, no edema, no cyanosis, no clubbing Skin:  No rashes no nodules Neuro:  CN II through XII intact, motor grossly intact  DEVICE  Normal device function.  See PaceArt for details.   Assess/Plan:

## 2011-09-07 NOTE — Assessment & Plan Note (Signed)
His symptoms are class I. He will continue his current medical therapy and maintain a low-sodium diet. 

## 2011-09-07 NOTE — Assessment & Plan Note (Signed)
He appears to be maintaining sinus rhythm very nicely on low-dose amiodarone. He will continue his current medical therapy.

## 2011-10-11 ENCOUNTER — Other Ambulatory Visit: Payer: Self-pay | Admitting: Internal Medicine

## 2011-10-22 NOTE — Progress Notes (Signed)
UR Completed.  Austin [name] Jane [phone] 10/22/2011  

## 2012-02-01 ENCOUNTER — Encounter: Payer: Self-pay | Admitting: Internal Medicine

## 2012-02-01 ENCOUNTER — Ambulatory Visit (INDEPENDENT_AMBULATORY_CARE_PROVIDER_SITE_OTHER): Payer: Medicare Other | Admitting: Internal Medicine

## 2012-02-01 DIAGNOSIS — I5022 Chronic systolic (congestive) heart failure: Secondary | ICD-10-CM

## 2012-02-01 DIAGNOSIS — I472 Ventricular tachycardia: Secondary | ICD-10-CM

## 2012-02-01 DIAGNOSIS — Z95810 Presence of automatic (implantable) cardiac defibrillator: Secondary | ICD-10-CM

## 2012-02-01 DIAGNOSIS — I428 Other cardiomyopathies: Secondary | ICD-10-CM

## 2012-02-01 LAB — ICD DEVICE OBSERVATION
AL AMPLITUDE: 2 mv
AL IMPEDENCE ICD: 456 Ohm
CHARGE TIME: 3.603 s
HV IMPEDENCE: 69 Ohm
RV LEAD AMPLITUDE: 11 mv
RV LEAD IMPEDENCE ICD: 456 Ohm
TOT-0001: 1
TOT-0002: 0
TZAT-0001FASTVT: 1
TZAT-0002ATACH: NEGATIVE
TZAT-0002ATACH: NEGATIVE
TZAT-0012ATACH: 150 ms
TZAT-0012FASTVT: 200 ms
TZAT-0018ATACH: NEGATIVE
TZAT-0019ATACH: 6 V
TZAT-0019ATACH: 6 V
TZAT-0020ATACH: 1.5 ms
TZAT-0020ATACH: 1.5 ms
TZAT-0020SLOWVT: 1.5 ms
TZON-0003SLOWVT: 360 ms
TZON-0003VSLOWVT: 450 ms
TZON-0004SLOWVT: 16
TZON-0004VSLOWVT: 32
TZON-0005SLOWVT: 12
TZST-0001ATACH: 5
TZST-0001FASTVT: 2
TZST-0001FASTVT: 3
TZST-0001FASTVT: 4
TZST-0001SLOWVT: 2
TZST-0001SLOWVT: 3
TZST-0002ATACH: NEGATIVE
TZST-0002ATACH: NEGATIVE
TZST-0002FASTVT: NEGATIVE
TZST-0002FASTVT: NEGATIVE
TZST-0002SLOWVT: NEGATIVE
TZST-0002SLOWVT: NEGATIVE
TZST-0002SLOWVT: NEGATIVE

## 2012-02-01 NOTE — Progress Notes (Signed)
HPI [name] returns today for followup. He is a very pleasant 71 year old man with a nonischemic cardiomyopathy, ventricular tachycardia, status post ICD implantation. In the interim, he has done well. He denies chest pain or shortness of breath. He remains active playing tennis several times a week. No chest pain, no shortness of breath, no syncope, no peripheral edema. No Known Allergies   Current Outpatient Prescriptions  Medication Sig Dispense Refill  . amiodarone (PACERONE) 200 MG tablet TAKE 1 TABLET BY MOUTH EVERY DAY  30 tablet  9  . folic acid (FOLVITE) 1 MG tablet Take 1 mg by mouth daily.       . Levothyroxine Sodium 75 MCG CAPS Take 1 capsule by mouth daily.       Marland Kitchen lisinopril (PRINIVIL,ZESTRIL) 20 MG tablet Take 20 mg by mouth daily.       . Multiple Vitamins-Minerals (MULTIVITAL) tablet Take 1 tablet by mouth daily.       . Omega-3 Fatty Acids (FISH OIL) 1000 MG CAPS Take 1 capsule by mouth daily.       Marland Kitchen warfarin (JANTOVEN) 5 MG tablet Take 5 mg by mouth as directed. Takes 7.5 mg on tue and sat, 5mg  all other days         Past Medical History  Diagnosis Date  . Nonischemic cardiomyopathy   . 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 Psychosexual dysfunction with inhibited sexual excitement   . Conductive hearing loss   . Syncope and collapse   . Atrial fibrillation   . Intraspinal abscess   . Mitral valve insufficiency and aortic valve insufficiency     s/p MVR mechanical  . S/P mitral valve replacement   . Unspecified sleep apnea     last sleep study 11/07  . Ventricular tachycardia     ROS:   All systems reviewed and negative except as noted in the HPI.   Past Surgical History  Procedure Date  . Mitral valve replacement     w #[address] [facility]  . Valve replacement 2000  . Evacution of epidural lumbar epidural abscess 1999  . Thyroidectomy   . Tonsillectomy   . Dental  implants   . Cardiac catheterization 04/24/2002  . Insert / replace / remove pacemaker 11/2008     No family history on file.   History   Social History  . Marital Status: Married    Spouse Name: N/A    Number of Children: N/A  . Years of Education: N/A   Occupational History  . Not on file.   Social History Main Topics  . Smoking status: Never Smoker   . Smokeless tobacco: Not on file  . Alcohol Use: Yes     occasionally  . Drug Use: No  . Sexually Active: Not on file   Other Topics Concern  . Not on file   Social History Narrative  . No narrative on file     BP 126/70  Pulse 68  Ht 6\' 2"  (1.88 m)  Wt 206 lb 12.8 oz (93.804 kg)  BMI 26.55 kg/m2  Physical Exam:  Well appearing 71 year old man, NAD HEENT: Unremarkable Neck:  No JVD, no thyromegally Lungs:  Clear with no wheezes, rales, or rhonchi. HEART:  Regular rate rhythm, no murmurs, no rubs, no clicks Abd:  soft, positive bowel sounds, no organomegally, no rebound, no guarding Ext:  2 plus pulses, no edema, no cyanosis, no clubbing  Skin:  No rashes no nodules Neuro:  CN II through XII intact, motor grossly intact  DEVICE  Normal device function.  See PaceArt for details.   Assess/Plan:

## 2012-02-01 NOTE — Assessment & Plan Note (Signed)
The patient has had several asymptomatic episodes of ventricular tachycardia lasting from one to 3 minutes at a time. His ventricular rate is around 140 beats per minute. It is observed in a monitor only zone. He is asymptomatic with this. He will continue his current dose of amiodarone.

## 2012-02-01 NOTE — Assessment & Plan Note (Signed)
His current symptoms are class 1-2. We have him program so that he has not pacing. He is tolerating this very nicely. He is instructed to maintain a low-sodium diet and continue his current medical therapy.

## 2012-02-01 NOTE — Patient Instructions (Addendum)
Your physician wants you to follow-up in: 6 months with Dr. Ladona Ridgel. You will receive a reminder letter in the mail two months in advance. If you don't receive a letter, please call our office to schedule the follow-up appointment.  Remote monitoring is used to monitor your Pacemaker of ICD from home. This monitoring reduces the number of office visits required to check your device to one time per year. It allows [place] to keep an eye on the functioning of your device to ensure it is working properly. You are scheduled for a device check from home on 05/08/2012. You may send your transmission at any time that day. If you have a wireless device, the transmission will be sent automatically. After your physician reviews your transmission, you will receive a postcard with your next transmission date.

## 2012-02-01 NOTE — Assessment & Plan Note (Signed)
His device is working normally. We'll plan to recheck in several months. 

## 2012-03-07 ENCOUNTER — Telehealth: Payer: Self-pay | Admitting: Internal Medicine

## 2012-03-07 NOTE — Telephone Encounter (Signed)
This just started last 3-4 days feels tired and his defib has not gone off.  His BP and HR are running low. He just isn't feeling great.  I am going to put him on for a BP check and a device interrogation tomorrow at 11:30

## 2012-03-07 NOTE — Telephone Encounter (Signed)
New msg Pt has been having low BP 108/53. He said he feel fatigued. No chest pain

## 2012-03-08 ENCOUNTER — Encounter: Payer: Self-pay | Admitting: Internal Medicine

## 2012-03-08 ENCOUNTER — Ambulatory Visit (INDEPENDENT_AMBULATORY_CARE_PROVIDER_SITE_OTHER): Payer: Medicare Other | Admitting: Internal Medicine

## 2012-03-08 ENCOUNTER — Other Ambulatory Visit: Payer: Self-pay | Admitting: *Deleted

## 2012-03-08 ENCOUNTER — Encounter: Payer: Self-pay | Admitting: *Deleted

## 2012-03-08 VITALS — BP 110/74 | HR 58 | Resp 18 | Ht 74.0 in | Wt 202.0 lb

## 2012-03-08 DIAGNOSIS — Z01812 Encounter for preprocedural laboratory examination: Secondary | ICD-10-CM

## 2012-03-08 DIAGNOSIS — I4891 Unspecified atrial fibrillation: Secondary | ICD-10-CM

## 2012-03-08 DIAGNOSIS — T827XXA Infection and inflammatory reaction due to other cardiac and vascular devices, implants and grafts, initial encounter: Secondary | ICD-10-CM

## 2012-03-08 DIAGNOSIS — I428 Other cardiomyopathies: Secondary | ICD-10-CM

## 2012-03-08 LAB — ICD DEVICE OBSERVATION
AL IMPEDENCE ICD: 456 Ohm
BRDY-0002RV: 40 {beats}/min
CHARGE TIME: 8.4 s
HV IMPEDENCE: 75 Ohm
LV LEAD IMPEDENCE ICD: 456 Ohm
RV LEAD IMPEDENCE ICD: 475 Ohm
TZAT-0001ATACH: 1
TZAT-0001ATACH: 2
TZAT-0001SLOWVT: 1
TZAT-0002ATACH: NEGATIVE
TZAT-0002ATACH: NEGATIVE
TZAT-0002ATACH: NEGATIVE
TZAT-0002FASTVT: NEGATIVE
TZAT-0002SLOWVT: NEGATIVE
TZAT-0012ATACH: 150 ms
TZAT-0012FASTVT: 200 ms
TZAT-0018ATACH: NEGATIVE
TZAT-0018ATACH: NEGATIVE
TZAT-0019ATACH: 6 V
TZAT-0019ATACH: 6 V
TZAT-0019FASTVT: 8 V
TZAT-0019SLOWVT: 8 V
TZAT-0020ATACH: 1.5 ms
TZAT-0020FASTVT: 1.5 ms
TZON-0003ATACH: 350 ms
TZON-0004VSLOWVT: 32
TZON-0005SLOWVT: 12
TZST-0001ATACH: 5
TZST-0001FASTVT: 5
TZST-0001SLOWVT: 3
TZST-0001SLOWVT: 4
TZST-0001SLOWVT: 5
TZST-0002ATACH: NEGATIVE
TZST-0002ATACH: NEGATIVE
TZST-0002FASTVT: NEGATIVE
TZST-0002FASTVT: NEGATIVE
TZST-0002FASTVT: NEGATIVE
TZST-0002FASTVT: NEGATIVE
TZST-0002SLOWVT: NEGATIVE
TZST-0002SLOWVT: NEGATIVE
TZST-0002SLOWVT: NEGATIVE

## 2012-03-08 LAB — BASIC METABOLIC PANEL
CO2: 28 mEq/L (ref 19–32)
Calcium: 9 mg/dL (ref 8.4–10.5)
GFR: 63.13 mL/min (ref >60.00)
Glucose, Bld: 84 mg/dL (ref 70–99)
Potassium: 5 mEq/L (ref 3.5–5.1)
Sodium: 137 mEq/L (ref 135–145)

## 2012-03-08 LAB — CBC WITH DIFFERENTIAL/PLATELET
Basophils Absolute: 0 10*3/uL (ref 0.0–0.1)
Basophils Relative: 0.5 % (ref 0.0–3.0)
HCT: 43.4 % (ref 39.0–52.0)
Hemoglobin: 14.1 g/dL (ref 13.0–17.0)
Lymphocytes Relative: 13.2 % (ref 12.0–46.0)
Lymphs Abs: 1 10*3/uL (ref 0.7–4.0)
Monocytes Relative: 9.3 % (ref 3.0–12.0)
Neutro Abs: 5.6 10*3/uL (ref 1.4–7.7)
RBC: 4.78 Mil/uL (ref 4.22–5.81)
RDW: 14.6 % (ref 11.5–14.6)

## 2012-03-08 MED ORDER — SODIUM CHLORIDE 0.9 % IJ SOLN: 3.0000 mL | Freq: Two times a day (BID) | INTRAMUSCULAR | Status: DC

## 2012-03-08 MED ORDER — SODIUM CHLORIDE 0.9 % IV SOLN: 250.0000 mL | INTRAVENOUS | Status: DC

## 2012-03-08 MED ORDER — SODIUM CHLORIDE 0.9 % IJ SOLN: 3.0000 mL | INTRAMUSCULAR | Status: DC | PRN

## 2012-03-08 NOTE — Assessment & Plan Note (Signed)
He has had one episode of ventricular tachycardia at 154 beats per minute. This was below his dissection zone and was not symptomatic. We'll continue his current medication.

## 2012-03-08 NOTE — Assessment & Plan Note (Signed)
He has had recurrent atrial fibrillation. We will check his INR today. If his Coumadin level is therapeutic, we'll schedule him for cardioversion tomorrow. If he is subtherapeutic we'll have him increase his Coumadin and plan a TEE guided cardioversion in the next week.

## 2012-03-08 NOTE — Progress Notes (Signed)
HPI Austin [name] returns today for followup. He is a 71-year-old man with multiple medical problems including atrial fibrillation and ventricular tachycardia, status post ICD implantation. The patient has been on amiodarone for over a year. He is felt poorly the last few days. He denies medical noncompliance and denies alcohol abuse or intake of caffeine in excess. He feels weak and fatigued and has more shortness of breath. He denies peripheral edema. No Known Allergies   Current Outpatient Prescriptions  Medication Sig Dispense Refill  . amiodarone (PACERONE) 200 MG tablet TAKE 1 TABLET BY MOUTH EVERY DAY  30 tablet  9  . folic acid (FOLVITE) 1 MG tablet Take 1 mg by mouth daily.       . Levothyroxine Sodium 75 MCG CAPS Take 1 capsule by mouth daily.       . lisinopril (PRINIVIL,ZESTRIL) 20 MG tablet Take 20 mg by mouth daily.       . Multiple Vitamins-Minerals (MULTIVITAL) tablet Take 1 tablet by mouth daily.       . Omega-3 Fatty Acids (FISH OIL) 1000 MG CAPS Take 1 capsule by mouth daily.       . warfarin (JANTOVEN) 5 MG tablet Take 5 mg by mouth as directed. Takes 7.5 mg on tue and sat, 5mg all other days         Past Medical History  Diagnosis Date  . Nonischemic cardiomyopathy   . 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 Psychosexual dysfunction with inhibited sexual excitement   . Conductive hearing loss   . Syncope and collapse   . Atrial fibrillation   . Intraspinal abscess   . Mitral valve insufficiency and aortic valve insufficiency     s/p MVR mechanical  . S/P mitral valve replacement   . Unspecified sleep apnea     last sleep study 11/07  . Ventricular tachycardia     ROS:   All systems reviewed and negative except as noted in the HPI.   Past Surgical History  Procedure Date  . Mitral valve replacement     w #[address] [facility]  . Valve replacement 2000  . Evacution of epidural lumbar  epidural abscess 1999  . Thyroidectomy   . Tonsillectomy   . Dental implants   . Cardiac catheterization 04/24/2002  . Insert / replace / remove pacemaker 11/2008     No family history on file.   History   Social History  . Marital Status: Married    Spouse Name: N/A    Number of Children: N/A  . Years of Education: N/A   Occupational History  . Not on file.   Social History Main Topics  . Smoking status: Never Smoker   . Smokeless tobacco: Not on file  . Alcohol Use: Yes     occasionally  . Drug Use: No  . Sexually Active: Not on file   Other Topics Concern  . Not on file   Social History Narrative  . No narrative on file     BP 110/74  Pulse 58  Resp 18  Ht 6' 2" (1.88 m)  Wt 202 lb (91.627 kg)  BMI 25.94 kg/m2  SpO2 98%  Physical Exam:  Well appearing NAD HEENT: Unremarkable Neck:  No JVD, no thyromegally Lungs:  Clear with no wheezes, rales, or rhonchi. HEART:  Regular rate rhythm, no murmurs, no rubs, no clicks Abd:  soft, positive bowel sounds, no organomegally,   no rebound, no guarding Ext:  2 plus pulses, no edema, no cyanosis, no clubbing Skin:  No rashes no nodules Neuro:  CN II through XII intact, motor grossly intact  EKG Atrial fibrillation with a controlled ventricular response DEVICE  Normal device function.  See PaceArt for details.   Assess/Plan:  

## 2012-03-09 ENCOUNTER — Encounter (HOSPITAL_COMMUNITY): Payer: Self-pay | Admitting: Anesthesiology

## 2012-03-09 ENCOUNTER — Ambulatory Visit (HOSPITAL_COMMUNITY)
Admission: RE | Admit: 2012-03-09 | Discharge: 2012-03-09 | Disposition: A | Discharge status: Home / Self Care | Payer: Medicare Other | Source: Ambulatory Visit | Attending: Internal Medicine | Admitting: Internal Medicine

## 2012-03-09 ENCOUNTER — Encounter (HOSPITAL_COMMUNITY)
Admission: RE | Disposition: A | Discharge status: Home / Self Care | Payer: Self-pay | Source: Ambulatory Visit | Attending: Internal Medicine

## 2012-03-09 ENCOUNTER — Ambulatory Visit (HOSPITAL_COMMUNITY): Payer: Medicare Other | Admitting: Anesthesiology

## 2012-03-09 ENCOUNTER — Encounter (HOSPITAL_COMMUNITY): Payer: Self-pay

## 2012-03-09 DIAGNOSIS — Z95810 Presence of automatic (implantable) cardiac defibrillator: Secondary | ICD-10-CM | POA: Insufficient documentation

## 2012-03-09 DIAGNOSIS — I4891 Unspecified atrial fibrillation: Secondary | ICD-10-CM | POA: Insufficient documentation

## 2012-03-09 HISTORY — PX: CARDIOVERSION: SHX1299

## 2012-03-09 SURGERY — CARDIOVERSION
Anesthesia: Monitor Anesthesia Care | Wound class: Clean

## 2012-03-09 MED ORDER — PROPOFOL 10 MG/ML IV BOLUS
INTRAVENOUS | Status: DC | PRN
  Administered 2012-03-09: 100 mg via INTRAVENOUS

## 2012-03-09 MED ORDER — LIDOCAINE HCL (CARDIAC) 20 MG/ML IV SOLN
INTRAVENOUS | Status: DC | PRN
  Administered 2012-03-09: 20 mg via INTRAVENOUS

## 2012-03-09 MED ORDER — SODIUM CHLORIDE 0.9 % IV SOLN
INTRAVENOUS | Status: DC | PRN
  Administered 2012-03-09: 14:00:00 via INTRAVENOUS

## 2012-03-09 MED ORDER — SODIUM CHLORIDE 0.9 % IV SOLN
INTRAVENOUS | Status: DC
  Administered 2012-03-09: 500 mL via INTRAVENOUS

## 2012-03-09 NOTE — Transfer of Care (Signed)
Immediate Anesthesia Transfer of Care Note  Patient: Austin [name]  Procedure(s) Performed: Procedure(s) (LRB): CARDIOVERSION (N/A)  Patient Location: Endoscopy Unit  Anesthesia Type: General  Level of Consciousness: awake, alert  and oriented  Airway & Oxygen Therapy: Patient Spontanous Breathing and Patient connected to nasal cannula oxygen  Post-op Assessment: Report given to PACU RN and Post -op Vital signs reviewed and stable  Post vital signs: Reviewed and stable  Complications: No apparent anesthesia complications

## 2012-03-09 NOTE — H&P (View-Only) (Signed)
HPI Austin [name] returns today for followup. He is a 71 year old man with multiple medical problems including atrial fibrillation and ventricular tachycardia, status post ICD implantation. The patient has been on amiodarone for over a year. He is felt poorly the last few days. He denies medical noncompliance and denies alcohol abuse or intake of caffeine in excess. He feels weak and fatigued and has more shortness of breath. He denies peripheral edema. No Known Allergies   Current Outpatient Prescriptions  Medication Sig Dispense Refill  . amiodarone (PACERONE) 200 MG tablet TAKE 1 TABLET BY MOUTH EVERY DAY  30 tablet  9  . folic acid (FOLVITE) 1 MG tablet Take 1 mg by mouth daily.       . Levothyroxine Sodium 75 MCG CAPS Take 1 capsule by mouth daily.       Marland Kitchen lisinopril (PRINIVIL,ZESTRIL) 20 MG tablet Take 20 mg by mouth daily.       . Multiple Vitamins-Minerals (MULTIVITAL) tablet Take 1 tablet by mouth daily.       . Omega-3 Fatty Acids (FISH OIL) 1000 MG CAPS Take 1 capsule by mouth daily.       Marland Kitchen warfarin (JANTOVEN) 5 MG tablet Take 5 mg by mouth as directed. Takes 7.5 mg on tue and sat, 5mg  all other days         Past Medical History  Diagnosis Date  . Nonischemic cardiomyopathy   . 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 Psychosexual dysfunction with inhibited sexual excitement   . Conductive hearing loss   . Syncope and collapse   . Atrial fibrillation   . Intraspinal abscess   . Mitral valve insufficiency and aortic valve insufficiency     s/p MVR mechanical  . S/P mitral valve replacement   . Unspecified sleep apnea     last sleep study 11/07  . Ventricular tachycardia     ROS:   All systems reviewed and negative except as noted in the HPI.   Past Surgical History  Procedure Date  . Mitral valve replacement     w #[facility]  . Valve replacement 2000  . Evacution of epidural lumbar  epidural abscess 1999  . Thyroidectomy   . Tonsillectomy   . Dental implants   . Cardiac catheterization 04/24/2002  . Insert / replace / remove pacemaker 11/2008     No family history on file.   History   Social History  . Marital Status: Married    Spouse Name: N/A    Number of Children: N/A  . Years of Education: N/A   Occupational History  . Not on file.   Social History Main Topics  . Smoking status: Never Smoker   . Smokeless tobacco: Not on file  . Alcohol Use: Yes     occasionally  . Drug Use: No  . Sexually Active: Not on file   Other Topics Concern  . Not on file   Social History Narrative  . No narrative on file     BP 110/74  Pulse 58  Resp 18  Ht 6\' 2"  (1.88 m)  Wt 202 lb (91.627 kg)  BMI 25.94 kg/m2  SpO2 98%  Physical Exam:  Well appearing NAD HEENT: Unremarkable Neck:  No JVD, no thyromegally Lungs:  Clear with no wheezes, rales, or rhonchi. HEART:  Regular rate rhythm, no murmurs, no rubs, no clicks Abd:  soft, positive bowel sounds, no organomegally,  no rebound, no guarding Ext:  2 plus pulses, no edema, no cyanosis, no clubbing Skin:  No rashes no nodules Neuro:  CN II through XII intact, motor grossly intact  EKG Atrial fibrillation with a controlled ventricular response DEVICE  Normal device function.  See PaceArt for details.   Assess/Plan:

## 2012-03-09 NOTE — Op Note (Signed)
EP Procedure Note  Procedure: DCCV Indication: symptomatic atrial fib Findings: after informed consent obtained. Patient prepped in the usual manner. He was given IV propofol under the direction of Dr. Lodema Hong. He was cardioverted with 200 J of synchronized biphasic energy, restoring NSR.  Complications: No immediate complications.  Rec: will discharge later today and followup with me in 6-8 weeks. I would like him to increase amio to 400 mg daily until he sees me back in followup.  Lewayne Bunting, M.D.

## 2012-03-09 NOTE — Anesthesia Preprocedure Evaluation (Addendum)
Anesthesia Evaluation  Patient identified by MRN, date of birth, ID band Patient awake    Reviewed: Allergy & Precautions, H&P , NPO status , Patient's Chart, lab work & pertinent test results, reviewed documented beta blocker date and time   History of Anesthesia Complications Negative for: history of anesthetic complications  Airway       Dental   Pulmonary  breath sounds clear to auscultation        Cardiovascular + dysrhythmias Atrial Fibrillation + pacemaker + Cardiac Defibrillator + Valvular Problems/Murmurs Rhythm:Irregular Rate:Normal     Neuro/Psych    GI/Hepatic Neg liver ROS,   Endo/Other    Renal/GU negative Renal ROS     Musculoskeletal   Abdominal   Peds  Hematology negative hematology ROS (+)   Anesthesia Other Findings   Reproductive/Obstetrics                          Anesthesia Physical Anesthesia Plan  ASA: II  Anesthesia Plan: General   Post-op Pain Management:    Induction: Intravenous  Airway Management Planned: Mask  Additional Equipment:   Intra-op Plan:   Post-operative Plan:   Informed Consent: I have reviewed the patients History and Physical, chart, labs and discussed the procedure including the risks, benefits and alternatives for the proposed anesthesia with the patient or authorized representative who has indicated his/her understanding and acceptance.     Plan Discussed with: CRNA and Surgeon  Anesthesia Plan Comments:         Anesthesia Quick Evaluation

## 2012-03-09 NOTE — Interval H&P Note (Signed)
History and Physical Interval Note:   03/09/2012 1:28 PM  Austin [name]  has presented today for surgery, with the diagnosis of a-fib  The various methods of treatment have been discussed with the patient and family. After consideration of risks, benefits and other options for treatment, the patient has consented to  Procedure(s) (LRB): CARDIOVERSION (N/A) as a surgical intervention .  The patient's history has been reviewed, patient examined, no change in status, stable for surgery.  I have reviewed the patient's chart and labs.  Questions were answered to the patient's satisfaction.     Lewayne Bunting

## 2012-03-09 NOTE — Anesthesia Postprocedure Evaluation (Signed)
  Anesthesia Post-op Note  Patient: Austin [name]  Procedure(s) Performed: Procedure(s) (LRB): CARDIOVERSION (N/A)  Patient Location: Endoscopy Unit  Anesthesia Type: General  Level of Consciousness: awake, alert , oriented and patient cooperative  Airway and Oxygen Therapy: Patient Spontanous Breathing  Post-op Pain: none  Post-op Assessment: Post-op Vital signs reviewed, Patient's Cardiovascular Status Stable, Respiratory Function Stable, Patent Airway, No signs of Nausea or vomiting, Adequate PO intake, Pain level controlled, No headache, No backache, No residual numbness and No residual motor weakness  Post-op Vital Signs: Reviewed and stable  Complications: No apparent anesthesia complications

## 2012-03-10 ENCOUNTER — Encounter (HOSPITAL_COMMUNITY): Payer: Self-pay | Admitting: Internal Medicine

## 2012-03-15 ENCOUNTER — Telehealth: Payer: Self-pay | Admitting: Internal Medicine

## 2012-03-15 MED ORDER — AMIODARONE HCL 200 MG PO TABS: ORAL_TABLET | ORAL | Status: DC

## 2012-03-15 NOTE — Telephone Encounter (Signed)
Pt is on amio 400mg  for 3 weeks and it needs to called into [facility] on [address] he needs 35 pills of the 200mg  to get him through

## 2012-03-15 NOTE — Telephone Encounter (Signed)
Gave 60 tablets with no refills  He is going to take 400mg  for 3 weeks then go back to 200mg  daily

## 2012-04-25 ENCOUNTER — Other Ambulatory Visit: Payer: Self-pay | Admitting: Internal Medicine

## 2012-05-08 ENCOUNTER — Encounter: Payer: Medicare Other | Admitting: *Deleted

## 2012-05-11 ENCOUNTER — Encounter: Payer: Self-pay | Admitting: *Deleted

## 2012-08-01 ENCOUNTER — Encounter: Payer: Self-pay | Admitting: Internal Medicine

## 2012-08-01 ENCOUNTER — Ambulatory Visit (INDEPENDENT_AMBULATORY_CARE_PROVIDER_SITE_OTHER): Payer: Medicare Other | Admitting: Internal Medicine

## 2012-08-01 VITALS — BP 121/71 | HR 65 | Resp 18 | Ht 74.0 in | Wt 205.0 lb

## 2012-08-01 DIAGNOSIS — I472 Ventricular tachycardia, unspecified: Secondary | ICD-10-CM

## 2012-08-01 DIAGNOSIS — I4891 Unspecified atrial fibrillation: Secondary | ICD-10-CM

## 2012-08-01 DIAGNOSIS — I5022 Chronic systolic (congestive) heart failure: Secondary | ICD-10-CM

## 2012-08-01 DIAGNOSIS — I428 Other cardiomyopathies: Secondary | ICD-10-CM

## 2012-08-01 DIAGNOSIS — Z95810 Presence of automatic (implantable) cardiac defibrillator: Secondary | ICD-10-CM

## 2012-08-01 LAB — ICD DEVICE OBSERVATION
AL IMPEDENCE ICD: 475 Ohm
AL THRESHOLD: 1 V
RV LEAD AMPLITUDE: 12.4 mv
TZAT-0001ATACH: 1
TZAT-0002ATACH: NEGATIVE
TZAT-0012ATACH: 150 ms
TZAT-0012FASTVT: 200 ms
TZAT-0012SLOWVT: 200 ms
TZAT-0018ATACH: NEGATIVE
TZAT-0018FASTVT: NEGATIVE
TZAT-0019ATACH: 6 V
TZAT-0019ATACH: 6 V
TZAT-0020ATACH: 1.5 ms
TZAT-0020ATACH: 1.5 ms
TZAT-0020FASTVT: 1.5 ms
TZAT-0020SLOWVT: 1.5 ms
TZON-0003ATACH: 350 ms
TZON-0003SLOWVT: 360 ms
TZST-0001ATACH: 4
TZST-0001ATACH: 5
TZST-0001FASTVT: 2
TZST-0001FASTVT: 4
TZST-0001FASTVT: 6
TZST-0001SLOWVT: 5
TZST-0002ATACH: NEGATIVE
TZST-0002ATACH: NEGATIVE
TZST-0002FASTVT: NEGATIVE
TZST-0002SLOWVT: NEGATIVE
TZST-0002SLOWVT: NEGATIVE

## 2012-08-01 NOTE — Assessment & Plan Note (Signed)
His heart failure symptoms are class II. No change in medical therapy.

## 2012-08-01 NOTE — Assessment & Plan Note (Signed)
His device is working normally. He is in the VVI mode of pacing as his sinus node is working normally and his PR interval is fairly short with a narrow QRS complex.

## 2012-08-01 NOTE — Assessment & Plan Note (Signed)
The patient has had ventricular tachycardia which has been asymptomatic, at rates of 150 beats per minute, for several minutes at a time.

## 2012-08-01 NOTE — Patient Instructions (Addendum)
Remote monitoring is used to monitor your Pacemaker of ICD from home. This monitoring reduces the number of office visits required to check your device to one time per year. It allows [place] to keep an eye on the functioning of your device to ensure it is working properly. You are scheduled for a device check from home on October 30, 2012. You may send your transmission at any time that day. If you have a wireless device, the transmission will be sent automatically. After your physician reviews your transmission, you will receive a postcard with your next transmission date.  Your physician wants you to follow-up in: 6 months with Dr Ladona Ridgel.  You will receive a reminder letter in the mail two months in advance. If you don't receive a letter, please call our office to schedule the follow-up appointment.

## 2012-08-01 NOTE — Assessment & Plan Note (Signed)
He is maintaining sinus rhythm. He'll continue his current dose of amiodarone.

## 2012-08-01 NOTE — Progress Notes (Signed)
HPI [name] returns today for followup. He is a 72 year old man with a history of ventricular tachycardia, chronic systolic heart failure, paroxysmal atrial fibrillation, chronic amiodarone therapy, and hypertension. He underwent DC cardioversion several months ago restoring sinus rhythm. He is been maintained on amiodarone therapy. He is on 200 mg a day. He denies syncope, chest pain, or ICD shock. No peripheral edema. He continues to play tennis on a regular basis. He notes occasional episodes of shortness of breath. No Known Allergies   Current Outpatient Prescriptions  Medication Sig Dispense Refill  . amiodarone (PACERONE) 200 MG tablet TAKE 2 TABLETS EVERY DAY FOR 3 WEEKS, THEN GO BACK TO 1 TABLET EVERY DAY  30 tablet  6  . Coenzyme Q10 (CO Q-10) 200 MG CAPS Take 200 mg by mouth daily.      . folic acid (FOLVITE) 1 MG tablet Take 1 mg by mouth daily.       . Levothyroxine Sodium 75 MCG CAPS Take 1 capsule by mouth daily.       Marland Kitchen lisinopril (PRINIVIL,ZESTRIL) 20 MG tablet Take 20 mg by mouth daily.       . Multiple Vitamins-Minerals (MULTIVITAL) tablet Take 1 tablet by mouth daily.       . Omega-3 Fatty Acids (FISH OIL) 1000 MG CAPS Take 1 capsule by mouth daily.       Marland Kitchen warfarin (JANTOVEN) 5 MG tablet Take 5 mg by mouth as directed. Takes 7.5 mg on tue and sat, 5mg  all other days       Current Facility-Administered Medications  Medication Dose Route Frequency Provider Last Rate Last Dose  . 0.9 %  sodium chloride infusion  250 mL Intravenous Continuous Marinus Maw, MD      . sodium chloride 0.9 % injection 3 mL  3 mL Intravenous Q12H Marinus Maw, MD      . sodium chloride 0.9 % injection 3 mL  3 mL Intravenous PRN Marinus Maw, MD         Past Medical History  Diagnosis Date  . Nonischemic cardiomyopathy   . 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 Psychosexual dysfunction with inhibited  sexual excitement   . Conductive hearing loss   . Syncope and collapse   . Atrial fibrillation   . Intraspinal abscess   . Mitral valve insufficiency and aortic valve insufficiency     s/p MVR mechanical  . S/P mitral valve replacement   . Unspecified sleep apnea     last sleep study 11/07  . Ventricular tachycardia     ROS:   All systems reviewed and negative except as noted in the HPI.   Past Surgical History  Procedure Date  . Mitral valve replacement     w #[address] [facility]  . Valve replacement 2000  . Evacution of epidural lumbar epidural abscess 1999  . Thyroidectomy   . Tonsillectomy   . Dental implants   . Cardiac catheterization 04/24/2002  . Insert / replace / remove pacemaker 11/2008  . Cardioversion 03/09/2012    Procedure: CARDIOVERSION;  Surgeon: Marinus Maw, MD;  Location: [facility] ENDOSCOPY;  Service: Cardiovascular;  Laterality: N/A;     No family history on file.   History   Social History  . Marital Status: Married    Spouse Name: N/A    Number of Children: N/A  . Years of Education: N/A   Occupational History  .  Not on file.   Social History Main Topics  . Smoking status: Never Smoker   . Smokeless tobacco: Not on file  . Alcohol Use: Yes     Comment: occasionally  . Drug Use: No  . Sexually Active: Not on file   Other Topics Concern  . Not on file   Social History Narrative  . No narrative on file     BP 121/71  Pulse 65  Resp 18  Ht 6\' 2"  (1.88 m)  Wt 205 lb (92.987 kg)  BMI 26.32 kg/m2  Physical Exam:  Well appearing 72 year old man, NAD HEENT: Unremarkable Neck:  No JVD, no thyromegally Lungs:  Clear with no wheezes, rales, or rhonchi. HEART:  Regular rate rhythm, no murmurs, no rubs, no clicks Abd:  soft, positive bowel sounds, no organomegally, no rebound, no guarding Ext:  2 plus pulses, no edema, no cyanosis, no clubbing Skin:  No rashes no nodules Neuro:  CN II through XII intact, motor grossly intact   DEVICE    Normal device function.  See PaceArt for details.   Assess/Plan:

## 2012-08-22 ENCOUNTER — Telehealth: Payer: Self-pay | Admitting: Internal Medicine

## 2012-08-22 NOTE — Telephone Encounter (Signed)
Spoke w/pt in regards device shocking. Pt is in [place] at this time and will not be back until Wednesday late. Pt will send transmission Thursday am and will have Dr Ladona Ridgel review. Dr Ladona Ridgel aware of pt receiving therapy and waiting to look at episode. Pt instructed no driving x 6 mths. Pt to wait for phone call following up after transmission.

## 2012-08-22 NOTE — Telephone Encounter (Signed)
New Problem: ° ° ° °Patient called in wanting to speak with you.  Please call back. °

## 2012-08-22 NOTE — Telephone Encounter (Signed)
Follow Up     Regarding defrib went off

## 2012-08-24 ENCOUNTER — Encounter: Payer: Self-pay | Admitting: Internal Medicine

## 2012-08-30 ENCOUNTER — Ambulatory Visit (INDEPENDENT_AMBULATORY_CARE_PROVIDER_SITE_OTHER): Payer: Medicare Other | Admitting: Internal Medicine

## 2012-08-30 ENCOUNTER — Encounter: Payer: Self-pay | Admitting: Internal Medicine

## 2012-08-30 VITALS — BP 112/65 | HR 62 | Ht 73.5 in | Wt 206.8 lb

## 2012-08-30 DIAGNOSIS — I472 Ventricular tachycardia: Secondary | ICD-10-CM

## 2012-08-30 DIAGNOSIS — I4891 Unspecified atrial fibrillation: Secondary | ICD-10-CM

## 2012-08-30 DIAGNOSIS — I5022 Chronic systolic (congestive) heart failure: Secondary | ICD-10-CM

## 2012-08-30 LAB — ICD DEVICE OBSERVATION
AL IMPEDENCE ICD: 456 Ohm
AL THRESHOLD: 0.75 V
CHARGE TIME: 0.78 s
FVT: 0
PACEART VT: 0
TOT-0001: 3
TOT-0002: 1
TZAT-0001ATACH: 1
TZAT-0001SLOWVT: 1
TZAT-0002ATACH: NEGATIVE
TZAT-0002ATACH: NEGATIVE
TZAT-0004FASTVT: 8
TZAT-0004FASTVT: 8
TZAT-0004SLOWVT: 8
TZAT-0005FASTVT: 88 pct
TZAT-0011FASTVT: 10 ms
TZAT-0011FASTVT: 10 ms
TZAT-0012ATACH: 150 ms
TZAT-0012FASTVT: 200 ms
TZAT-0012FASTVT: 200 ms
TZAT-0012SLOWVT: 200 ms
TZAT-0013FASTVT: 2
TZAT-0013FASTVT: 2
TZAT-0018ATACH: NEGATIVE
TZAT-0019ATACH: 6 V
TZAT-0019ATACH: 6 V
TZAT-0019SLOWVT: 8 V
TZAT-0020ATACH: 1.5 ms
TZAT-0020FASTVT: 1.5 ms
TZAT-0020FASTVT: 1.5 ms
TZAT-0020SLOWVT: 1.5 ms
TZON-0003ATACH: 350 ms
TZON-0003SLOWVT: 340 ms
TZST-0001ATACH: 4
TZST-0001ATACH: 5
TZST-0001FASTVT: 4
TZST-0001FASTVT: 5
TZST-0001FASTVT: 6
TZST-0001SLOWVT: 2
TZST-0001SLOWVT: 4
TZST-0001SLOWVT: 6
TZST-0002ATACH: NEGATIVE
TZST-0003FASTVT: 30 J
TZST-0003FASTVT: 35 J
TZST-0003FASTVT: 35 J
TZST-0003SLOWVT: 20 J
TZST-0003SLOWVT: 35 J
TZST-0003SLOWVT: 35 J
TZST-0003SLOWVT: 35 J
VENTRICULAR PACING ICD: 0.31 pct

## 2012-08-30 MED ORDER — AMIODARONE HCL 200 MG PO TABS
200.0000 mg | ORAL_TABLET | Freq: Two times a day (BID) | ORAL | Status: DC
Start: 2012-08-30 — End: 2013-03-13

## 2012-08-30 NOTE — Assessment & Plan Note (Signed)
Today I've recommended that he increase his dose of amiodarone to 200 mg twice a day. In addition, we have increased the amount of anti-tachycardic pacing therapy that his device will deliver for ventricular tachycardia.

## 2012-08-30 NOTE — Assessment & Plan Note (Signed)
His heart failure symptoms are currently class II. No change in medical therapy at this time.

## 2012-08-30 NOTE — Progress Notes (Signed)
HPI Austin [name] returns today for followup. He is a very pleasant 72 year old man with ventricular tachycardia, chronic systolic heart failure, syncope, status post ICD implantation. The patient was in [place] last week when he experienced 2 ICD shocks. He had ventricular tachycardia on interrogation of his device at 190 beats per minute. Prior to the episodes, he had been exercising before one episode and taking snow off the windshield of his car before another. The patient denies shortness of breath or worsening heart failure symptoms. He did note fatigue and malaise while at altitude. No Known Allergies   Current Outpatient Prescriptions  Medication Sig Dispense Refill  . Coenzyme Q10 (CO Q-10) 200 MG CAPS Take 200 mg by mouth daily.      . folic acid (FOLVITE) 1 MG tablet Take 1 mg by mouth daily.       . Levothyroxine Sodium 75 MCG CAPS Take 1 capsule by mouth daily.       Marland Kitchen lisinopril (PRINIVIL,ZESTRIL) 20 MG tablet Take 20 mg by mouth daily.       . Multiple Vitamins-Minerals (MULTIVITAL) tablet Take 1 tablet by mouth daily.       . Omega-3 Fatty Acids (FISH OIL) 1000 MG CAPS Take 1 capsule by mouth daily.       Marland Kitchen warfarin (JANTOVEN) 5 MG tablet Take 5 mg by mouth as directed. Takes 7.5 mg on tue and sat, 5mg  all other days      . amiodarone (PACERONE) 200 MG tablet Take 1 tablet (200 mg total) by mouth 2 (two) times daily.  180 tablet  3   Current Facility-Administered Medications  Medication Dose Route Frequency Provider Last Rate Last Dose  . 0.9 %  sodium chloride infusion  250 mL Intravenous Continuous Marinus Maw, MD      . sodium chloride 0.9 % injection 3 mL  3 mL Intravenous Q12H Marinus Maw, MD      . sodium chloride 0.9 % injection 3 mL  3 mL Intravenous PRN Marinus Maw, MD         Past Medical History  Diagnosis Date  . Nonischemic cardiomyopathy   . 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 Psychosexual dysfunction with inhibited sexual excitement   . Conductive hearing loss   . Syncope and collapse   . Atrial fibrillation   . Intraspinal abscess   . Mitral valve insufficiency and aortic valve insufficiency     s/p MVR mechanical  . S/P mitral valve replacement   . Unspecified sleep apnea     last sleep study 11/07  . Ventricular tachycardia     ROS:   All systems reviewed and negative except as noted in the HPI.   Past Surgical History  Procedure Date  . Mitral valve replacement     w #[address] [facility]  . Valve replacement 2000  . Evacution of epidural lumbar epidural abscess 1999  . Thyroidectomy   . Tonsillectomy   . Dental implants   . Cardiac catheterization 04/24/2002  . Insert / replace / remove pacemaker 11/2008  . Cardioversion 03/09/2012    Procedure: CARDIOVERSION;  Surgeon: Marinus Maw, MD;  Location: [facility] ENDOSCOPY;  Service: Cardiovascular;  Laterality: N/A;     No family history on file.   History   Social History  . Marital Status: Married    Spouse Name: N/A    Number of Children: N/A  .  Years of Education: N/A   Occupational History  . Not on file.   Social History Main Topics  . Smoking status: Never Smoker   . Smokeless tobacco: Not on file  . Alcohol Use: Yes     Comment: occasionally  . Drug Use: No  . Sexually Active: Not on file   Other Topics Concern  . Not on file   Social History Narrative  . No narrative on file     BP 112/65  Pulse 62  Ht 6' 1.5" (1.867 m)  Wt 206 lb 12.8 oz (93.804 kg)  BMI 26.91 kg/m2  Physical Exam:  Well appearing66 year old man, NAD HEENT: Unremarkable Neck:  No JVD, no thyromegally Lungs:  Clear with no wheezes, rales, or rhonchi. HEART:  Regular rate rhythm, no murmurs, no rubs, no clicks Abd:  soft, positive bowel sounds, no organomegally, no rebound, no guarding Ext:  2 plus pulses, no edema, no cyanosis, no clubbing Skin:  No rashes no nodules Neuro:  CN II  through XII intact, motor grossly intact  EKG normal sinus rhythm with first-degree block, QRS duration 100 ms, PR interval 340 ms.  DEVICE  Normal device function.  See PaceArt for details. His device is been reprogrammed to attempt to facilitate anti-tachycardic pacing therapies.  Assess/Plan:

## 2012-08-30 NOTE — Patient Instructions (Addendum)
Amiodarone 200mg  twice daily.  Your physician recommends that you schedule a follow-up appointment in: 3 months with Dr. Ladona Ridgel.

## 2012-09-25 ENCOUNTER — Other Ambulatory Visit: Payer: Self-pay | Admitting: Dermatology

## 2012-10-19 ENCOUNTER — Telehealth: Payer: Self-pay | Admitting: Internal Medicine

## 2012-10-19 DIAGNOSIS — R5383 Other fatigue: Secondary | ICD-10-CM

## 2012-10-19 DIAGNOSIS — R29898 Other symptoms and signs involving the musculoskeletal system: Secondary | ICD-10-CM

## 2012-10-19 DIAGNOSIS — I472 Ventricular tachycardia: Secondary | ICD-10-CM

## 2012-10-19 NOTE — Telephone Encounter (Signed)
Received transmission, all looks good.  lmom for him to come in for lab work

## 2012-10-19 NOTE — Telephone Encounter (Signed)
New Prob   Has some questions regarding medication (amiodarone). Would like to speak to nurse.

## 2012-10-19 NOTE — Telephone Encounter (Signed)
This is off and on.  Over the last few days it has gotten worse.  He will all off a sudden fell like he has nothing left and will need to lay down.  He is going to sen a transmission today.  He is still able to play tennis and maintain his daily activities.   Discussed with Dr Ladona Ridgel  Will have the patient come in for labs CK/SR/CBC/Liver panel/ TSH

## 2012-10-20 ENCOUNTER — Other Ambulatory Visit (INDEPENDENT_AMBULATORY_CARE_PROVIDER_SITE_OTHER): Payer: Medicare Other

## 2012-10-20 ENCOUNTER — Encounter: Payer: Self-pay | Admitting: Internal Medicine

## 2012-10-20 DIAGNOSIS — I472 Ventricular tachycardia: Secondary | ICD-10-CM

## 2012-10-20 DIAGNOSIS — R5383 Other fatigue: Secondary | ICD-10-CM

## 2012-10-20 DIAGNOSIS — R29898 Other symptoms and signs involving the musculoskeletal system: Secondary | ICD-10-CM

## 2012-10-20 LAB — CBC WITH DIFFERENTIAL/PLATELET
Basophils Absolute: 0 10*3/uL (ref 0.0–0.1)
HCT: 40.7 % (ref 39.0–52.0)
Hemoglobin: 13.6 g/dL (ref 13.0–17.0)
Lymphs Abs: 0.9 10*3/uL (ref 0.7–4.0)
MCV: 88.2 fl (ref 78.0–100.0)
Monocytes Relative: 8.6 % (ref 3.0–12.0)
Neutro Abs: 7 10*3/uL (ref 1.4–7.7)
RDW: 15.6 % — ABNORMAL HIGH (ref 11.5–14.6)

## 2012-10-20 LAB — TSH: TSH: 2.65 u[IU]/mL (ref 0.35–5.50)

## 2012-10-20 LAB — HEPATIC FUNCTION PANEL
ALT: 29 U/L (ref 0–53)
AST: 28 U/L (ref 0–37)
Albumin: 3.7 g/dL (ref 3.5–5.2)

## 2012-10-20 LAB — CK: Total CK: 245 U/L — ABNORMAL HIGH (ref 7–232)

## 2012-10-20 NOTE — Telephone Encounter (Signed)
Doreatha Massed at 10/20/2012 8:57 AM   Status: Signed            New problem  Pt received your message and will come by today to have blood checked

## 2012-10-20 NOTE — Telephone Encounter (Signed)
This encounter was created in error - please disregard.

## 2012-10-20 NOTE — Telephone Encounter (Signed)
New problem   Pt received your message and will come by today to have blood checked.

## 2012-10-20 NOTE — Telephone Encounter (Signed)
All labs look great  Will decrease Amiodarone to once daily  His wife is aware

## 2012-10-31 IMAGING — CR DG CHEST 2V
2 series · 2 of 2 positions shown · non-contrast
Comparison: 06/09/2011

CLINICAL DATA: Post defibrillator extraction

CHEST - 2 VIEW

[view not recorded (1 of 2)]
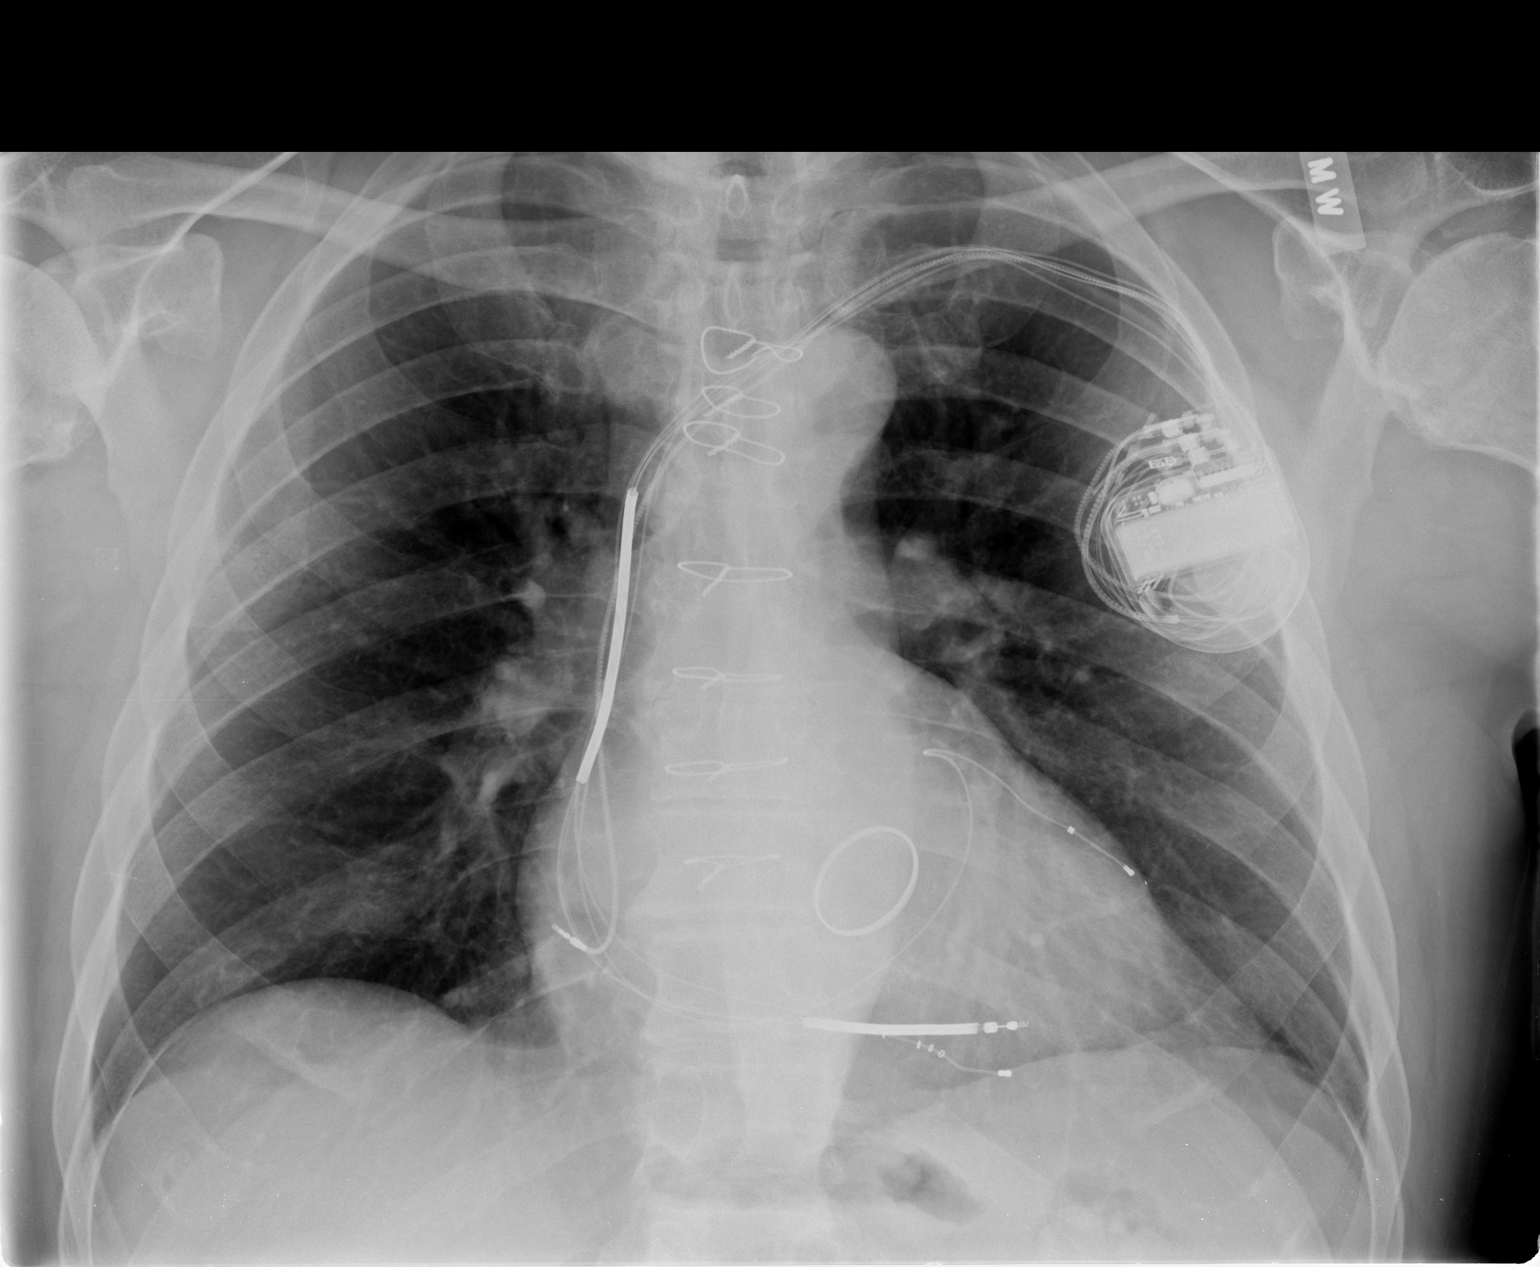

[view not recorded (2 of 2)]
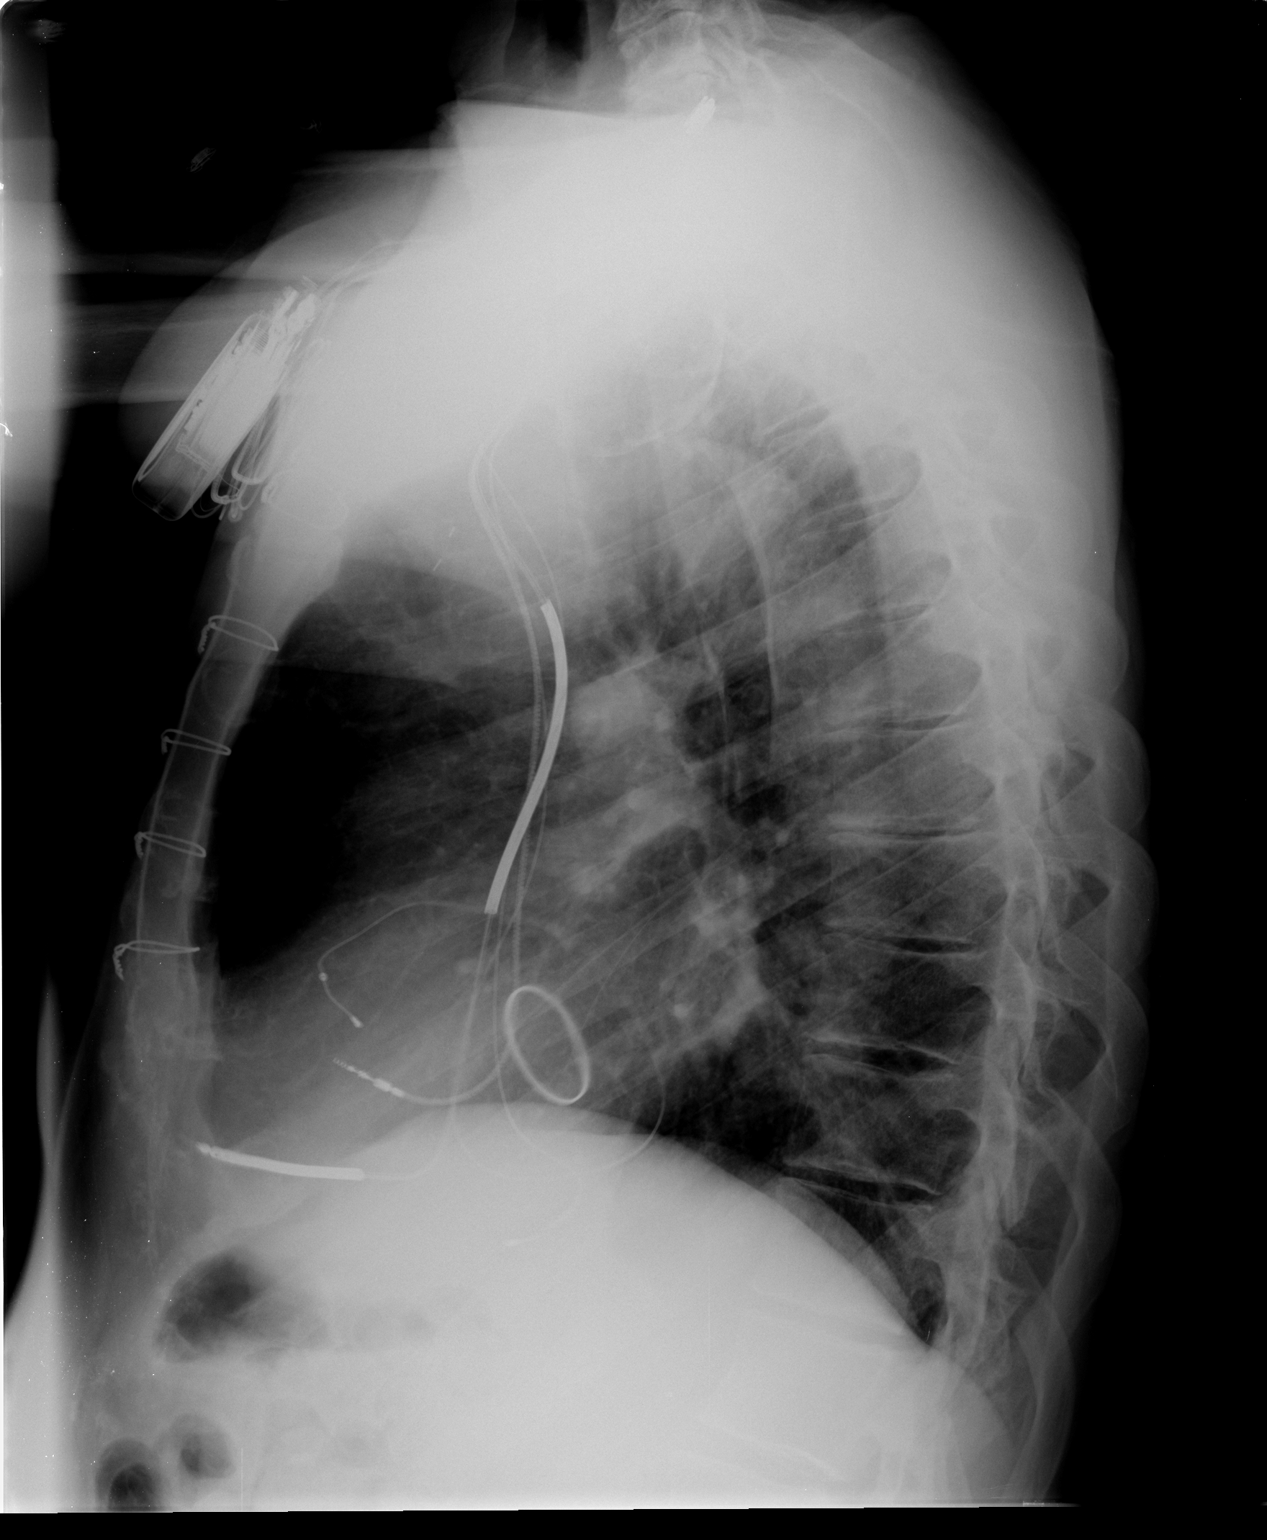

[2 of 2 positions shown; findings below may reference images not displayed]

FINDINGS: Left-sided AICD in place.  Evidence of mitral
valvuloplasty again noted.  No pleural effusion.  The lungs are
clear.  No pneumothorax.  Heart size is mildly enlarged without
edema, unchanged.
IMPRESSION: Mild cardiomegaly without focal acute finding.

## 2012-12-05 ENCOUNTER — Ambulatory Visit (INDEPENDENT_AMBULATORY_CARE_PROVIDER_SITE_OTHER): Payer: Medicare Other | Admitting: Internal Medicine

## 2012-12-05 ENCOUNTER — Encounter: Payer: Self-pay | Admitting: Internal Medicine

## 2012-12-05 VITALS — BP 126/60 | HR 80 | Ht 73.5 in | Wt 209.8 lb

## 2012-12-05 DIAGNOSIS — Z95810 Presence of automatic (implantable) cardiac defibrillator: Secondary | ICD-10-CM

## 2012-12-05 DIAGNOSIS — I4891 Unspecified atrial fibrillation: Secondary | ICD-10-CM

## 2012-12-05 DIAGNOSIS — I472 Ventricular tachycardia: Secondary | ICD-10-CM

## 2012-12-05 DIAGNOSIS — I428 Other cardiomyopathies: Secondary | ICD-10-CM

## 2012-12-05 DIAGNOSIS — I5022 Chronic systolic (congestive) heart failure: Secondary | ICD-10-CM

## 2012-12-05 LAB — ICD DEVICE OBSERVATION
AL IMPEDENCE ICD: 513 Ohm
ATRIAL PACING ICD: 0 pct
LV LEAD IMPEDENCE ICD: 418 Ohm
TOT-0002: 1
TOT-0006: 20130110000000
TZAT-0001ATACH: 1
TZAT-0001ATACH: 2
TZAT-0001FASTVT: 1
TZAT-0001FASTVT: 2
TZAT-0002ATACH: NEGATIVE
TZAT-0002ATACH: NEGATIVE
TZAT-0005FASTVT: 81 pct
TZAT-0005FASTVT: 88 pct
TZAT-0011SLOWVT: 10 ms
TZAT-0012ATACH: 150 ms
TZAT-0012FASTVT: 200 ms
TZAT-0012SLOWVT: 200 ms
TZAT-0013FASTVT: 2
TZAT-0013FASTVT: 2
TZAT-0013SLOWVT: 3
TZAT-0018ATACH: NEGATIVE
TZAT-0018ATACH: NEGATIVE
TZAT-0018ATACH: NEGATIVE
TZAT-0018FASTVT: NEGATIVE
TZAT-0018FASTVT: NEGATIVE
TZAT-0018SLOWVT: NEGATIVE
TZAT-0019ATACH: 6 V
TZAT-0019ATACH: 6 V
TZAT-0019SLOWVT: 8 V
TZAT-0020FASTVT: 1.5 ms
TZON-0003FASTVT: 280 ms
TZON-0003SLOWVT: 340 ms
TZON-0004VSLOWVT: 80
TZON-0005SLOWVT: 12
TZST-0001FASTVT: 3
TZST-0001FASTVT: 4
TZST-0001FASTVT: 5
TZST-0001SLOWVT: 3
TZST-0001SLOWVT: 6
TZST-0002ATACH: NEGATIVE
TZST-0003FASTVT: 30 J
TZST-0003FASTVT: 35 J
TZST-0003SLOWVT: 35 J
TZST-0003SLOWVT: 35 J
VENTRICULAR PACING ICD: 0.38 pct

## 2012-12-05 NOTE — Assessment & Plan Note (Signed)
His [employer]tronic biventricular ICD is working normally. He is programmed not to pace. He has had no recurrent sustained ventricular arrhythmias.

## 2012-12-05 NOTE — Assessment & Plan Note (Signed)
His chronic systolic heart failure is class 1-2. He will continue his current medical therapy.

## 2012-12-05 NOTE — Assessment & Plan Note (Signed)
His ventricular arrhythmias appear to be controlled. I cannot exclude slow ventricular tachycardia as the underlying mechanism of his feelings of fatigue and sinking. However his vitals have been stable during these episodes. I recommended watchful waiting.

## 2012-12-05 NOTE — Progress Notes (Signed)
HPI [name] returns today for followup. He is a pleasant 72 yo man with a non-ischemic CM, chronic systolic heart failure, atrial fib, and VT. He is s/p ICD implant. In the interim, he has done well except for episodic feelings of fatigue and weakness. His wife who has a nursing background states that he checks his vitals and finds that there are no hypotension and no bradycardia or tachycardia. He has had no syncope. No ICD shocks since February. No peripheral edema. His episodes of severe fatigue occur once or twice a week on average and last for over an hour. No Known Allergies   Current Outpatient Prescriptions  Medication Sig Dispense Refill  . amiodarone (PACERONE) 200 MG tablet Take 1 tablet (200 mg total) by mouth 2 (two) times daily.  180 tablet  3  . Coenzyme Q10 (CO Q-10) 200 MG CAPS Take 200 mg by mouth daily.      . folic acid (FOLVITE) 1 MG tablet Take 1 mg by mouth daily.       . Levothyroxine Sodium 75 MCG CAPS Take 1 capsule by mouth daily.       Marland Kitchen lisinopril (PRINIVIL,ZESTRIL) 20 MG tablet Take 20 mg by mouth daily.       . Multiple Vitamins-Minerals (MULTIVITAL) tablet Take 1 tablet by mouth daily.       . Omega-3 Fatty Acids (FISH OIL) 1000 MG CAPS Take 1 capsule by mouth daily.       Marland Kitchen warfarin (JANTOVEN) 5 MG tablet Take 5 mg by mouth as directed. Takes 7.5 mg on tue and sat, 5mg  all other days       Current Facility-Administered Medications  Medication Dose Route Frequency Provider Last Rate Last Dose  . 0.9 %  sodium chloride infusion  250 mL Intravenous Continuous Marinus Maw, MD      . sodium chloride 0.9 % injection 3 mL  3 mL Intravenous Q12H Marinus Maw, MD      . sodium chloride 0.9 % injection 3 mL  3 mL Intravenous PRN Marinus Maw, MD         Past Medical History  Diagnosis Date  . Nonischemic cardiomyopathy   . 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 Psychosexual  dysfunction with inhibited sexual excitement   . Conductive hearing loss   . Syncope and collapse   . Atrial fibrillation   . Intraspinal abscess   . Mitral valve insufficiency and aortic valve insufficiency     s/p MVR mechanical  . S/P mitral valve replacement   . Unspecified sleep apnea     last sleep study 11/07  . Ventricular tachycardia     ROS:   All systems reviewed and negative except as noted in the HPI.   Past Surgical History  Procedure Laterality Date  . Mitral valve replacement      w #[facility]  . Valve replacement  2000  . Evacution of epidural lumbar epidural abscess  1999  . Thyroidectomy    . Tonsillectomy    . Dental implants    . Cardiac catheterization  04/24/2002  . Insert / replace / remove pacemaker  11/2008  . Cardioversion  03/09/2012    Procedure: CARDIOVERSION;  Surgeon: Marinus Maw, MD;  Location: [facility] ENDOSCOPY;  Service: Cardiovascular;  Laterality: N/A;     No family history on file.   History   Social History  .  Marital Status: Married    Spouse Name: N/A    Number of Children: N/A  . Years of Education: N/A   Occupational History  . Not on file.   Social History Main Topics  . Smoking status: Never Smoker   . Smokeless tobacco: Not on file  . Alcohol Use: Yes     Comment: occasionally  . Drug Use: No  . Sexually Active: Not on file   Other Topics Concern  . Not on file   Social History Narrative  . No narrative on file     BP 126/60  Pulse 80  Ht 6' 1.5" (1.867 m)  Wt 209 lb 12.8 oz (95.165 kg)  BMI 27.3 kg/m2  Physical Exam:  Well appearing 72 year old man, NAD HEENT: Unremarkable Neck:  6 cm JVD, no thyromegally Back:  No CVA tenderness Lungs:  Clear with no wheezes, rales, or rhonchi. HEART:  Regular rate rhythm, no murmurs, no rubs, no clicks Abd:  soft, positive bowel sounds, no organomegally, no rebound, no guarding Ext:  2 plus pulses, no edema, no cyanosis, no clubbing Skin:  No rashes no  nodules Neuro:  CN II through XII intact, motor grossly intact  DEVICE  Normal device function.  See PaceArt for details.   Assess/Plan:

## 2012-12-05 NOTE — Patient Instructions (Addendum)
Your physician wants you to follow-up in: 6 months with Dr Court Joy will receive a reminder letter in the mail two months in advance. If you don't receive a letter, please call our office to schedule the follow-up appointment.  Remote monitoring is used to monitor your Pacemaker of ICD from home. This monitoring reduces the number of office visits required to check your device to one time per year. It allows [place] to keep an eye on the functioning of your device to ensure it is working properly. You are scheduled for a device check from home on 03/12/13. You may send your transmission at any time that day. If you have a wireless device, the transmission will be sent automatically. After your physician reviews your transmission, you will receive a postcard with your next transmission date.

## 2013-03-12 ENCOUNTER — Encounter: Payer: Medicare Other | Admitting: *Deleted

## 2013-03-13 ENCOUNTER — Other Ambulatory Visit: Payer: Self-pay

## 2013-03-13 DIAGNOSIS — I472 Ventricular tachycardia: Secondary | ICD-10-CM

## 2013-03-13 MED ORDER — AMIODARONE HCL 200 MG PO TABS: 200.0000 mg | ORAL_TABLET | Freq: Two times a day (BID) | ORAL | Status: DC

## 2013-03-22 ENCOUNTER — Encounter: Payer: Self-pay | Admitting: *Deleted

## 2013-03-29 ENCOUNTER — Ambulatory Visit (INDEPENDENT_AMBULATORY_CARE_PROVIDER_SITE_OTHER): Payer: Medicare Other | Admitting: *Deleted

## 2013-03-29 DIAGNOSIS — I472 Ventricular tachycardia: Secondary | ICD-10-CM

## 2013-04-06 LAB — REMOTE ICD DEVICE
AL AMPLITUDE: 0.4 mv
BATTERY VOLTAGE: 3.1398 V
LV LEAD IMPEDENCE ICD: 418 Ohm
RV LEAD IMPEDENCE ICD: 399 Ohm
RV LEAD THRESHOLD: 0.875 V
TOT-0006: 20130110000000
TZAT-0001ATACH: 1
TZAT-0001ATACH: 3
TZAT-0001FASTVT: 1
TZAT-0001FASTVT: 2
TZAT-0001SLOWVT: 1
TZAT-0002ATACH: NEGATIVE
TZAT-0004SLOWVT: 8
TZAT-0005FASTVT: 81 pct
TZAT-0005FASTVT: 88 pct
TZAT-0005SLOWVT: 88 pct
TZAT-0011FASTVT: 10 ms
TZAT-0012ATACH: 150 ms
TZAT-0012ATACH: 150 ms
TZAT-0012ATACH: 150 ms
TZAT-0012FASTVT: 200 ms
TZAT-0012FASTVT: 200 ms
TZAT-0013FASTVT: 2
TZAT-0013FASTVT: 2
TZAT-0013SLOWVT: 3
TZAT-0018ATACH: NEGATIVE
TZAT-0018FASTVT: NEGATIVE
TZAT-0018FASTVT: NEGATIVE
TZAT-0019ATACH: 6 V
TZAT-0020ATACH: 1.5 ms
TZAT-0020ATACH: 1.5 ms
TZAT-0020SLOWVT: 1.5 ms
TZON-0003ATACH: 350 ms
TZON-0003FASTVT: 280 ms
TZST-0001ATACH: 4
TZST-0001ATACH: 5
TZST-0001FASTVT: 3
TZST-0001FASTVT: 4
TZST-0001FASTVT: 6
TZST-0001SLOWVT: 2
TZST-0001SLOWVT: 4
TZST-0001SLOWVT: 5
TZST-0002ATACH: NEGATIVE
TZST-0002ATACH: NEGATIVE
TZST-0003FASTVT: 35 J
TZST-0003FASTVT: 35 J
TZST-0003SLOWVT: 35 J
TZST-0003SLOWVT: 35 J
TZST-0003SLOWVT: 35 J
VF: 0

## 2013-04-17 ENCOUNTER — Encounter: Payer: Self-pay | Admitting: *Deleted

## 2013-04-21 ENCOUNTER — Other Ambulatory Visit: Payer: Self-pay | Admitting: Interventional Cardiology

## 2013-04-21 DIAGNOSIS — Z79899 Other long term (current) drug therapy: Secondary | ICD-10-CM

## 2013-04-21 DIAGNOSIS — I4891 Unspecified atrial fibrillation: Secondary | ICD-10-CM

## 2013-05-01 ENCOUNTER — Encounter: Payer: Self-pay | Admitting: Internal Medicine

## 2013-05-15 ENCOUNTER — Ambulatory Visit (INDEPENDENT_AMBULATORY_CARE_PROVIDER_SITE_OTHER): Payer: Medicare Other | Admitting: Pharmacist

## 2013-05-15 DIAGNOSIS — Z954 Presence of other heart-valve replacement: Secondary | ICD-10-CM

## 2013-05-15 DIAGNOSIS — I059 Rheumatic mitral valve disease, unspecified: Secondary | ICD-10-CM

## 2013-05-23 ENCOUNTER — Ambulatory Visit (INDEPENDENT_AMBULATORY_CARE_PROVIDER_SITE_OTHER): Payer: Medicare Other | Admitting: Pharmacist

## 2013-05-23 DIAGNOSIS — I059 Rheumatic mitral valve disease, unspecified: Secondary | ICD-10-CM

## 2013-05-23 DIAGNOSIS — Z954 Presence of other heart-valve replacement: Secondary | ICD-10-CM

## 2013-05-23 LAB — POCT INR: INR: 2.7

## 2013-05-28 ENCOUNTER — Ambulatory Visit
Admission: RE | Admit: 2013-05-28 | Discharge: 2013-05-28 | Disposition: A | Discharge status: Home / Self Care | Payer: Medicare Other | Source: Ambulatory Visit | Attending: Interventional Cardiology | Admitting: Interventional Cardiology

## 2013-05-28 ENCOUNTER — Encounter: Payer: Self-pay | Admitting: Interventional Cardiology

## 2013-05-28 ENCOUNTER — Ambulatory Visit (INDEPENDENT_AMBULATORY_CARE_PROVIDER_SITE_OTHER): Payer: Medicare Other | Admitting: Interventional Cardiology

## 2013-05-28 ENCOUNTER — Other Ambulatory Visit (INDEPENDENT_AMBULATORY_CARE_PROVIDER_SITE_OTHER): Payer: Medicare Other

## 2013-05-28 VITALS — BP 127/76 | HR 57 | Ht 73.0 in | Wt 204.0 lb

## 2013-05-28 DIAGNOSIS — I472 Ventricular tachycardia: Secondary | ICD-10-CM

## 2013-05-28 DIAGNOSIS — I5022 Chronic systolic (congestive) heart failure: Secondary | ICD-10-CM

## 2013-05-28 DIAGNOSIS — Z79899 Other long term (current) drug therapy: Secondary | ICD-10-CM

## 2013-05-28 DIAGNOSIS — Z954 Presence of other heart-valve replacement: Secondary | ICD-10-CM

## 2013-05-28 DIAGNOSIS — I4891 Unspecified atrial fibrillation: Secondary | ICD-10-CM

## 2013-05-28 DIAGNOSIS — Z95810 Presence of automatic (implantable) cardiac defibrillator: Secondary | ICD-10-CM

## 2013-05-28 LAB — HEPATIC FUNCTION PANEL
ALT: 21 U/L (ref 0–53)
AST: 32 U/L (ref 0–37)
Albumin: 3.8 g/dL (ref 3.5–5.2)
Alkaline Phosphatase: 56 U/L (ref 39–117)

## 2013-05-28 NOTE — Progress Notes (Signed)
Patient ID: Austin [name], male   DOB: 1941/06/17, 72 y.o.   MRN: [id number]    [address], [address] [place], [place]  [address] Phone: [phone] Fax:  [phone]  Date:  05/28/2013   ID:  Austin [name], DOB [birth year], MRN [id number]  PCP:  Darnelle Bos, MD   ASSESSMENT:  1. Mechanical mitral valve 2. Chronic left ventricular systolic dysfunction, chronic systolic heart failure, stable 3. Chronic Coumadin anticoagulation therapy 4. Chronic amiodarone therapy, followup 5. Atrial fibrillation , suppressed with amiodarone  PLAN:  1. TSH, hepatic panel, and chest x-ray today and in 6 months 2. Continue followup in the Coumadin anticoagulation clinic 3. Continue active lifestyle    SUBJECTIVE: Austin [name] is a 72 y.o. male is in for followup of mechanical mitral valve, chronic systolic heart failure, and amiodarone therapy. He has no complaints. He remains quite physically active. He plays tennis several times per week along with golf a couple times per week. He denies dyspnea. He is not had chest pain, palpitations, or any recent AICD discharge. No bleeding on Coumadin. No transient neurological complaints.   Wt Readings from Last 3 Encounters:  05/28/13 204 lb (92.534 kg)  12/05/12 209 lb 12.8 oz (95.165 kg)  08/30/12 206 lb 12.8 oz (93.804 kg)     Past Medical History  Diagnosis Date  . Nonischemic cardiomyopathy   . 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 Psychosexual dysfunction with inhibited sexual excitement   . Conductive hearing loss   . Syncope and collapse   . Atrial fibrillation   . Intraspinal abscess   . Mitral valve insufficiency and aortic valve insufficiency     s/p MVR mechanical  . S/P mitral valve replacement   . Unspecified sleep apnea     last sleep study 11/07  . Ventricular tachycardia     Current Outpatient Prescriptions  Medication  Sig Dispense Refill  . amiodarone (PACERONE) 200 MG tablet Take 1 tablet (200 mg total) by mouth 2 (two) times daily.  180 tablet  3  . folic acid (FOLVITE) 1 MG tablet Take 1 mg by mouth daily.       . Levothyroxine Sodium 75 MCG CAPS Take 1 capsule by mouth daily.       Marland Kitchen lisinopril (PRINIVIL,ZESTRIL) 20 MG tablet Take 20 mg by mouth daily.       . Multiple Vitamins-Minerals (MULTIVITAL) tablet Take 1 tablet by mouth daily.       . Omega-3 Fatty Acids (FISH OIL) 1000 MG CAPS Take 1 capsule by mouth daily.       Marland Kitchen warfarin (COUMADIN) 10 MG tablet Take 5 mg on all days except 7.5 mg on Tuesday or as directed by coumadin clinic       Current Facility-Administered Medications  Medication Dose Route Frequency Provider Last Rate Last Dose  . 0.9 %  sodium chloride infusion  250 mL Intravenous Continuous Marinus Maw, MD      . sodium chloride 0.9 % injection 3 mL  3 mL Intravenous Q12H Marinus Maw, MD      . sodium chloride 0.9 % injection 3 mL  3 mL Intravenous PRN Marinus Maw, MD        Allergies:   No Known Allergies  Social History:  The patient  reports that he has never smoked. He does not have any smokeless tobacco history on file.  He reports that he drinks alcohol. He reports that he does not use illicit drugs.   ROS:  Please see the history of present illness.   All other systems reviewed and negative.   OBJECTIVE: VS:  BP 127/76  Pulse 57  Ht 6\' 1"  (1.854 m)  Wt 204 lb (92.534 kg)  BMI 26.92 kg/m2 Well nourished, well developed, in no acute distress, appears healthy, weight is stable,  HEENT: normal Neck: JVD flat. Carotid bruit absent  Cardiac:  normal S1, S2; RRR; no murmur. Mechanical S1 valve closure sound of the mitral prosthesis.  Lungs:  clear to auscultation bilaterally, no wheezing, rhonchi or rales Abd: soft, nontender, no hepatomegaly Ext: Edema none. Pulses 2+  Skin: warm and dry Neuro:  CNs 2-12 intact, no focal abnormalities noted  EKG:  none        Signed, Darci Needle III, MD 05/28/2013 3:07 PM

## 2013-05-28 NOTE — Patient Instructions (Signed)
Your physician recommends that you continue on your current medications as directed. Please refer to the Current Medication list given to you today.  Your physician wants you to follow-up in: 6 months You will receive a reminder letter in the mail two months in advance. If you don't receive a letter, please call our office to schedule the follow-up appointment.   A chest x-ray takes a picture of the organs and structures inside the chest, including the heart, lungs, and blood vessels. This test can show several things, including, whether the heart is enlarges; whether fluid is building up in the lungs; and whether pacemaker / defibrillator leads are still in place.

## 2013-05-30 ENCOUNTER — Other Ambulatory Visit: Payer: Self-pay

## 2013-05-30 ENCOUNTER — Telehealth: Payer: Self-pay

## 2013-05-30 MED ORDER — LISINOPRIL 20 MG PO TABS: 20.0000 mg | ORAL_TABLET | Freq: Every day | ORAL | Status: DC

## 2013-05-30 NOTE — Telephone Encounter (Signed)
Message copied by Jarvis Newcomer on Wed May 30, 2013 10:10 AM ------      Message from: Verdis Prime      Created: Wed May 30, 2013  8:55 AM       Stable ------

## 2013-05-30 NOTE — Telephone Encounter (Signed)
Message copied by Jarvis Newcomer on Wed May 30, 2013 10:07 AM ------      Message from: Verdis Prime      Created: Wed May 30, 2013  8:56 AM       Normal ------

## 2013-05-30 NOTE — Telephone Encounter (Signed)
Message copied by Jarvis Newcomer on Wed May 30, 2013 10:09 AM ------      Message from: Verdis Prime      Created: Wed May 30, 2013  8:55 AM       Stable ------

## 2013-05-30 NOTE — Telephone Encounter (Signed)
pt wife aware labs are normal.pt wife verbalized understanding.

## 2013-05-30 NOTE — Telephone Encounter (Signed)
pt wife made aware.pt chest xray stable.pt wife verbalized understanding.

## 2013-06-05 ENCOUNTER — Ambulatory Visit (INDEPENDENT_AMBULATORY_CARE_PROVIDER_SITE_OTHER): Payer: Medicare Other | Admitting: Internal Medicine

## 2013-06-05 ENCOUNTER — Encounter: Payer: Self-pay | Admitting: Internal Medicine

## 2013-06-05 VITALS — BP 119/69 | HR 59 | Ht 73.0 in | Wt 204.6 lb

## 2013-06-05 DIAGNOSIS — Z95810 Presence of automatic (implantable) cardiac defibrillator: Secondary | ICD-10-CM

## 2013-06-05 DIAGNOSIS — I472 Ventricular tachycardia, unspecified: Secondary | ICD-10-CM

## 2013-06-05 DIAGNOSIS — I5022 Chronic systolic (congestive) heart failure: Secondary | ICD-10-CM

## 2013-06-05 DIAGNOSIS — I429 Cardiomyopathy, unspecified: Secondary | ICD-10-CM

## 2013-06-05 DIAGNOSIS — I4891 Unspecified atrial fibrillation: Secondary | ICD-10-CM

## 2013-06-05 DIAGNOSIS — I428 Other cardiomyopathies: Secondary | ICD-10-CM

## 2013-06-05 LAB — MDC_IDC_ENUM_SESS_TYPE_INCLINIC
Battery Voltage: 3.15 V
Brady Statistic RV Percent Paced: 0.4 %
Lead Channel Pacing Threshold Amplitude: 1 V
Lead Channel Pacing Threshold Amplitude: 1 V
Lead Channel Pacing Threshold Pulse Width: 0.4 ms
Lead Channel Setting Pacing Amplitude: 2.5 V
Lead Channel Setting Pacing Pulse Width: 0.4 ms
Lead Channel Setting Sensing Sensitivity: 0.3 mV
Zone Setting Detection Interval: 250 ms
Zone Setting Detection Interval: 340 ms
Zone Setting Detection Interval: 350 ms

## 2013-06-05 NOTE — Progress Notes (Signed)
HPI [name] returns today for followup. He is a pleasant 72 yo man with a non-ischemic CM, chronic systolic heart failure, atrial fib, and VT. He is s/p ICD implant. In the interim, he has done well except for episodic feelings of fatigue and weakness. He has had no syncope. No ICD shocks since February. No peripheral edema. His episodes of severe fatigue occur once or twice a week on average and last for over an hour.  He also notes that he is tired today after playing both golf and tennis yesterday. He remains on 200 mg of amiodarone daily. His only other complaint is tightness in his shoulders and back. No Known Allergies   Current Outpatient Prescriptions  Medication Sig Dispense Refill  . amiodarone (PACERONE) 200 MG tablet Take 200 mg by mouth daily.      . folic acid (FOLVITE) 1 MG tablet Take 1 mg by mouth daily.       . Levothyroxine Sodium 75 MCG CAPS Take 1 capsule by mouth daily.       Marland Kitchen lisinopril (PRINIVIL,ZESTRIL) 20 MG tablet Take 1 tablet (20 mg total) by mouth daily.  30 tablet  6  . Multiple Vitamins-Minerals (MULTIVITAL) tablet Take 1 tablet by mouth daily.       . Omega-3 Fatty Acids (FISH OIL) 1000 MG CAPS Take 1 capsule by mouth daily.       Marland Kitchen warfarin (COUMADIN) 10 MG tablet Take 5 mg on all days except 7.5 mg on Tuesday or as directed by coumadin clinic       Current Facility-Administered Medications  Medication Dose Route Frequency Provider Last Rate Last Dose  . 0.9 %  sodium chloride infusion  250 mL Intravenous Continuous Marinus Maw, MD      . sodium chloride 0.9 % injection 3 mL  3 mL Intravenous Q12H Marinus Maw, MD      . sodium chloride 0.9 % injection 3 mL  3 mL Intravenous PRN Marinus Maw, MD         Past Medical History  Diagnosis Date  . Nonischemic cardiomyopathy   . 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 Psychosexual dysfunction with inhibited sexual  excitement   . Conductive hearing loss   . Syncope and collapse   . Atrial fibrillation   . Intraspinal abscess   . Mitral valve insufficiency and aortic valve insufficiency     s/p MVR mechanical  . S/P mitral valve replacement   . Unspecified sleep apnea     last sleep study 11/07  . Ventricular tachycardia     ROS:   All systems reviewed and negative except as noted in the HPI.   Past Surgical History  Procedure Laterality Date  . Mitral valve replacement      w #[address] [facility]  . Valve replacement  2000  . Evacution of epidural lumbar epidural abscess  1999  . Thyroidectomy    . Tonsillectomy    . Dental implants    . Cardiac catheterization  04/24/2002  . Insert / replace / remove pacemaker  11/2008  . Cardioversion  03/09/2012    Procedure: CARDIOVERSION;  Surgeon: Marinus Maw, MD;  Location: [facility] ENDOSCOPY;  Service: Cardiovascular;  Laterality: N/A;     No family history on file.   History   Social History  . Marital Status: Married    Spouse Name: N/A    Number  of Children: N/A  . Years of Education: N/A   Occupational History  . Not on file.   Social History Main Topics  . Smoking status: Never Smoker   . Smokeless tobacco: Not on file  . Alcohol Use: Yes     Comment: occasionally  . Drug Use: No  . Sexual Activity: Not on file   Other Topics Concern  . Not on file   Social History Narrative  . No narrative on file     BP 119/69  Pulse 59  Ht 6\' 1"  (1.854 m)  Wt 204 lb 9.6 oz (92.806 kg)  BMI 27.00 kg/m2  Physical Exam:  Well appearing 72 year old man, NAD HEENT: Unremarkable Neck:  6 cm JVD, no thyromegally Back:  No CVA tenderness Lungs:  Clear with no wheezes, rales, or rhonchi.  Well-healed ICD incision HEART:  Regular rate rhythm, no murmurs, no rubs, no clicks Abd:  soft, positive bowel sounds, no organomegally, no rebound, no guarding Ext:  2 plus pulses, no edema, no cyanosis, no clubbing Skin:  No rashes no nodules Neuro:   CN II through XII intact, motor grossly intact  DEVICE  Normal device function.  See PaceArt for details.   Assess/Plan:

## 2013-06-05 NOTE — Assessment & Plan Note (Signed)
His Medtronic ICD is working normally. We'll plan to recheck in several months. 

## 2013-06-05 NOTE — Assessment & Plan Note (Signed)
The patient has had no recurrent ventricular arrhythmias. He will continue amiodarone 200 mg daily.

## 2013-06-05 NOTE — Assessment & Plan Note (Signed)
On amiodarone, the patient is maintaining sinus rhythm. He'll continue his current medical therapy.

## 2013-06-05 NOTE — Patient Instructions (Signed)
Remote monitoring is used to monitor your ICD from home. This monitoring reduces the number of office visits required to check your device to one time per year. It allows [place] to keep an eye on the functioning of your device to ensure it is working properly. You are scheduled for a device check from home on 09-06-2013. You may send your transmission at any time that day. If you have a wireless device, the transmission will be sent automatically. After your physician reviews your transmission, you will receive a postcard with your next transmission date.  Your physician recommends that you schedule a follow-up appointment in: 1 YEAR WITH DR. Ladona Ridgel

## 2013-06-07 ENCOUNTER — Ambulatory Visit (INDEPENDENT_AMBULATORY_CARE_PROVIDER_SITE_OTHER): Payer: Medicare Other | Admitting: Pharmacist

## 2013-06-07 DIAGNOSIS — Z954 Presence of other heart-valve replacement: Secondary | ICD-10-CM

## 2013-06-07 DIAGNOSIS — I059 Rheumatic mitral valve disease, unspecified: Secondary | ICD-10-CM

## 2013-06-08 ENCOUNTER — Encounter: Payer: Self-pay | Admitting: Internal Medicine

## 2013-06-25 ENCOUNTER — Other Ambulatory Visit: Payer: Self-pay | Admitting: *Deleted

## 2013-06-25 ENCOUNTER — Ambulatory Visit (INDEPENDENT_AMBULATORY_CARE_PROVIDER_SITE_OTHER): Payer: Medicare Other | Admitting: Internal Medicine

## 2013-06-25 DIAGNOSIS — I059 Rheumatic mitral valve disease, unspecified: Secondary | ICD-10-CM

## 2013-06-25 DIAGNOSIS — Z954 Presence of other heart-valve replacement: Secondary | ICD-10-CM

## 2013-06-25 MED ORDER — WARFARIN SODIUM 10 MG PO TABS: ORAL_TABLET | ORAL | Status: DC

## 2013-07-09 ENCOUNTER — Telehealth: Payer: Self-pay | Admitting: Pharmacist

## 2013-07-09 ENCOUNTER — Ambulatory Visit (INDEPENDENT_AMBULATORY_CARE_PROVIDER_SITE_OTHER): Payer: Medicare Other | Admitting: Pharmacist

## 2013-07-09 DIAGNOSIS — I059 Rheumatic mitral valve disease, unspecified: Secondary | ICD-10-CM

## 2013-07-09 DIAGNOSIS — Z954 Presence of other heart-valve replacement: Secondary | ICD-10-CM

## 2013-07-09 NOTE — Telephone Encounter (Signed)
Erroneous

## 2013-07-13 ENCOUNTER — Ambulatory Visit (INDEPENDENT_AMBULATORY_CARE_PROVIDER_SITE_OTHER): Payer: Medicare Other | Admitting: Pharmacist

## 2013-07-13 DIAGNOSIS — I059 Rheumatic mitral valve disease, unspecified: Secondary | ICD-10-CM

## 2013-07-13 DIAGNOSIS — Z954 Presence of other heart-valve replacement: Secondary | ICD-10-CM

## 2013-07-13 LAB — POCT INR: INR: 2.1

## 2013-07-23 ENCOUNTER — Ambulatory Visit (INDEPENDENT_AMBULATORY_CARE_PROVIDER_SITE_OTHER): Payer: Medicare Other | Admitting: Pharmacist

## 2013-07-23 DIAGNOSIS — Z954 Presence of other heart-valve replacement: Secondary | ICD-10-CM

## 2013-07-23 DIAGNOSIS — I059 Rheumatic mitral valve disease, unspecified: Secondary | ICD-10-CM

## 2013-07-25 ENCOUNTER — Other Ambulatory Visit: Payer: Self-pay

## 2013-07-25 MED ORDER — LEVOTHYROXINE SODIUM 75 MCG PO CAPS: 1.0000 | ORAL_CAPSULE | Freq: Every day | ORAL | Status: DC

## 2013-07-30 ENCOUNTER — Ambulatory Visit (INDEPENDENT_AMBULATORY_CARE_PROVIDER_SITE_OTHER): Payer: Medicare Other | Admitting: Cardiology

## 2013-07-30 DIAGNOSIS — I059 Rheumatic mitral valve disease, unspecified: Secondary | ICD-10-CM

## 2013-07-30 DIAGNOSIS — Z954 Presence of other heart-valve replacement: Secondary | ICD-10-CM

## 2013-07-30 LAB — POCT INR: INR: 3.6

## 2013-08-13 ENCOUNTER — Ambulatory Visit (INDEPENDENT_AMBULATORY_CARE_PROVIDER_SITE_OTHER): Payer: Medicare Other | Admitting: Pharmacist

## 2013-08-13 DIAGNOSIS — Z954 Presence of other heart-valve replacement: Secondary | ICD-10-CM

## 2013-08-13 DIAGNOSIS — I059 Rheumatic mitral valve disease, unspecified: Secondary | ICD-10-CM

## 2013-08-13 LAB — POCT INR: INR: 4.5

## 2013-08-20 ENCOUNTER — Ambulatory Visit (INDEPENDENT_AMBULATORY_CARE_PROVIDER_SITE_OTHER): Payer: Medicare Other | Admitting: Pharmacist

## 2013-08-20 DIAGNOSIS — I059 Rheumatic mitral valve disease, unspecified: Secondary | ICD-10-CM

## 2013-08-20 DIAGNOSIS — Z5181 Encounter for therapeutic drug level monitoring: Secondary | ICD-10-CM

## 2013-08-20 DIAGNOSIS — Z954 Presence of other heart-valve replacement: Secondary | ICD-10-CM

## 2013-08-20 LAB — POCT INR: INR: 3.3

## 2013-08-27 LAB — POCT INR: INR: 2.1

## 2013-08-28 ENCOUNTER — Ambulatory Visit (INDEPENDENT_AMBULATORY_CARE_PROVIDER_SITE_OTHER): Payer: Medicare Other | Admitting: Pharmacist

## 2013-08-28 DIAGNOSIS — Z954 Presence of other heart-valve replacement: Secondary | ICD-10-CM

## 2013-08-28 DIAGNOSIS — I059 Rheumatic mitral valve disease, unspecified: Secondary | ICD-10-CM

## 2013-08-28 DIAGNOSIS — Z5181 Encounter for therapeutic drug level monitoring: Secondary | ICD-10-CM

## 2013-09-04 ENCOUNTER — Ambulatory Visit (INDEPENDENT_AMBULATORY_CARE_PROVIDER_SITE_OTHER): Payer: Medicare Other | Admitting: Pharmacist

## 2013-09-04 DIAGNOSIS — Z5181 Encounter for therapeutic drug level monitoring: Secondary | ICD-10-CM

## 2013-09-04 DIAGNOSIS — I059 Rheumatic mitral valve disease, unspecified: Secondary | ICD-10-CM

## 2013-09-04 DIAGNOSIS — Z954 Presence of other heart-valve replacement: Secondary | ICD-10-CM

## 2013-09-04 LAB — POCT INR: INR: 3.8

## 2013-09-06 ENCOUNTER — Ambulatory Visit (INDEPENDENT_AMBULATORY_CARE_PROVIDER_SITE_OTHER): Payer: Medicare Other | Admitting: *Deleted

## 2013-09-06 DIAGNOSIS — I428 Other cardiomyopathies: Secondary | ICD-10-CM

## 2013-09-06 DIAGNOSIS — I5022 Chronic systolic (congestive) heart failure: Secondary | ICD-10-CM

## 2013-09-06 DIAGNOSIS — I429 Cardiomyopathy, unspecified: Secondary | ICD-10-CM

## 2013-09-06 DIAGNOSIS — I4891 Unspecified atrial fibrillation: Secondary | ICD-10-CM

## 2013-09-12 ENCOUNTER — Encounter: Payer: Self-pay | Admitting: *Deleted

## 2013-09-14 LAB — MDC_IDC_ENUM_SESS_TYPE_REMOTE
Battery Voltage: 3.13 V
Brady Statistic AP VS Percent: 0 %
Brady Statistic AS VS Percent: 99.61 %
Brady Statistic RV Percent Paced: 0.39 %
Date Time Interrogation Session: 20150212151424
HIGH POWER IMPEDANCE MEASURED VALUE: 361 Ohm
HIGH POWER IMPEDANCE MEASURED VALUE: 72 Ohm
HighPow Impedance: 190 Ohm
Lead Channel Impedance Value: 418 Ohm
Lead Channel Impedance Value: 456 Ohm
Lead Channel Impedance Value: 456 Ohm
Lead Channel Impedance Value: 475 Ohm
Lead Channel Impedance Value: 722 Ohm
Lead Channel Pacing Threshold Amplitude: 0.875 V
Lead Channel Pacing Threshold Pulse Width: 0.4 ms
Lead Channel Sensing Intrinsic Amplitude: 1.25 mV
Lead Channel Sensing Intrinsic Amplitude: 1.25 mV
Lead Channel Sensing Intrinsic Amplitude: 12.5 mV
Lead Channel Setting Pacing Amplitude: 2.5 V
MDC IDC MSMT LEADCHNL RV SENSING INTR AMPL: 12.5 mV
MDC IDC SET LEADCHNL RV PACING PULSEWIDTH: 0.4 ms
MDC IDC SET LEADCHNL RV SENSING SENSITIVITY: 0.3 mV
MDC IDC SET ZONE DETECTION INTERVAL: 250 ms
MDC IDC SET ZONE DETECTION INTERVAL: 340 ms
MDC IDC STAT BRADY AP VP PERCENT: 0 %
MDC IDC STAT BRADY AS VP PERCENT: 0.39 %
MDC IDC STAT BRADY RA PERCENT PACED: 0 %
Zone Setting Detection Interval: 280 ms
Zone Setting Detection Interval: 350 ms
Zone Setting Detection Interval: 450 ms

## 2013-09-17 LAB — PROTIME-INR: INR: 3.5 — AB (ref 0.9–1.1)

## 2013-09-17 LAB — POCT INR: INR: 3.5

## 2013-09-19 ENCOUNTER — Encounter: Payer: Self-pay | Admitting: *Deleted

## 2013-09-27 ENCOUNTER — Ambulatory Visit (INDEPENDENT_AMBULATORY_CARE_PROVIDER_SITE_OTHER): Payer: Medicare Other | Admitting: Pharmacist

## 2013-09-27 DIAGNOSIS — Z954 Presence of other heart-valve replacement: Secondary | ICD-10-CM

## 2013-09-27 DIAGNOSIS — Z5181 Encounter for therapeutic drug level monitoring: Secondary | ICD-10-CM

## 2013-09-27 DIAGNOSIS — I059 Rheumatic mitral valve disease, unspecified: Secondary | ICD-10-CM

## 2013-10-01 ENCOUNTER — Ambulatory Visit (INDEPENDENT_AMBULATORY_CARE_PROVIDER_SITE_OTHER): Payer: Medicare Other | Admitting: Pharmacist

## 2013-10-01 DIAGNOSIS — Z5181 Encounter for therapeutic drug level monitoring: Secondary | ICD-10-CM

## 2013-10-01 DIAGNOSIS — Z954 Presence of other heart-valve replacement: Secondary | ICD-10-CM

## 2013-10-01 DIAGNOSIS — I059 Rheumatic mitral valve disease, unspecified: Secondary | ICD-10-CM

## 2013-10-01 LAB — POCT INR: INR: 3

## 2013-10-02 ENCOUNTER — Other Ambulatory Visit: Payer: Self-pay | Admitting: Dermatology

## 2013-10-08 ENCOUNTER — Encounter: Payer: Self-pay | Admitting: Internal Medicine

## 2013-10-15 ENCOUNTER — Encounter: Payer: Self-pay | Admitting: Interventional Cardiology

## 2013-10-16 ENCOUNTER — Encounter: Payer: Self-pay | Admitting: Internal Medicine

## 2013-10-16 LAB — PROTIME-INR: INR: 2.8 — AB (ref 0.9–1.1)

## 2013-10-18 ENCOUNTER — Ambulatory Visit (INDEPENDENT_AMBULATORY_CARE_PROVIDER_SITE_OTHER): Payer: Medicare Other | Admitting: *Deleted

## 2013-10-18 DIAGNOSIS — I059 Rheumatic mitral valve disease, unspecified: Secondary | ICD-10-CM

## 2013-10-18 DIAGNOSIS — Z5181 Encounter for therapeutic drug level monitoring: Secondary | ICD-10-CM

## 2013-10-18 DIAGNOSIS — Z954 Presence of other heart-valve replacement: Secondary | ICD-10-CM

## 2013-10-29 ENCOUNTER — Ambulatory Visit (INDEPENDENT_AMBULATORY_CARE_PROVIDER_SITE_OTHER): Payer: Medicare Other | Admitting: Internal Medicine

## 2013-10-29 DIAGNOSIS — Z954 Presence of other heart-valve replacement: Secondary | ICD-10-CM

## 2013-10-29 DIAGNOSIS — I059 Rheumatic mitral valve disease, unspecified: Secondary | ICD-10-CM

## 2013-10-29 DIAGNOSIS — Z5181 Encounter for therapeutic drug level monitoring: Secondary | ICD-10-CM

## 2013-10-29 LAB — POCT INR: INR: 4.7

## 2013-11-05 ENCOUNTER — Ambulatory Visit (INDEPENDENT_AMBULATORY_CARE_PROVIDER_SITE_OTHER): Payer: Medicare Other | Admitting: Cardiology

## 2013-11-05 DIAGNOSIS — Z954 Presence of other heart-valve replacement: Secondary | ICD-10-CM

## 2013-11-05 DIAGNOSIS — Z5181 Encounter for therapeutic drug level monitoring: Secondary | ICD-10-CM

## 2013-11-05 DIAGNOSIS — I059 Rheumatic mitral valve disease, unspecified: Secondary | ICD-10-CM

## 2013-11-05 LAB — POCT INR: INR: 2.4

## 2013-11-12 LAB — POCT INR: INR: 3.1

## 2013-11-13 ENCOUNTER — Ambulatory Visit (INDEPENDENT_AMBULATORY_CARE_PROVIDER_SITE_OTHER): Payer: Medicare Other | Admitting: Cardiology

## 2013-11-13 DIAGNOSIS — I059 Rheumatic mitral valve disease, unspecified: Secondary | ICD-10-CM

## 2013-11-13 DIAGNOSIS — Z5181 Encounter for therapeutic drug level monitoring: Secondary | ICD-10-CM

## 2013-11-13 DIAGNOSIS — Z954 Presence of other heart-valve replacement: Secondary | ICD-10-CM

## 2013-11-19 ENCOUNTER — Other Ambulatory Visit: Payer: Self-pay | Admitting: *Deleted

## 2013-11-19 ENCOUNTER — Telehealth: Payer: Self-pay | Admitting: Internal Medicine

## 2013-11-19 DIAGNOSIS — I4891 Unspecified atrial fibrillation: Secondary | ICD-10-CM

## 2013-11-19 NOTE — Telephone Encounter (Signed)
New message     Problems with low pulse rate for a week---low 40-50's.   Muscle soreness and weakness and lightheaded.  See Dr Tamala Julian 5-22. Pt want to talk to the nurse.

## 2013-11-19 NOTE — Telephone Encounter (Signed)
Patient sent transmission.  He has been in afib for 16 days. INR's are good.  Discussed with Dr Lovena Le, will increase Amiodarone to 600mg  daily for 2 weeks and have him get a DCCV on Thurs with Dr Lovena Le  Patient aware  I will call back with a time

## 2013-11-19 NOTE — Telephone Encounter (Signed)
Patient aware to be at the hospital  Will have labs on Wed at 12  Aware to be NPO after midnight for Cardioversion at 2 on Thurs 11/22/13

## 2013-11-19 NOTE — Telephone Encounter (Signed)
BP-- 127/74 ---on Lisinopril 20mg   149/74---on Lisinopril 10mg     He decreased the medication on his on to see if it helped with his dizziness.  I have asked him to send a transmission to see what is going on.  He may be having PVC's

## 2013-11-21 ENCOUNTER — Other Ambulatory Visit (INDEPENDENT_AMBULATORY_CARE_PROVIDER_SITE_OTHER): Payer: Medicare Other

## 2013-11-21 DIAGNOSIS — I4891 Unspecified atrial fibrillation: Secondary | ICD-10-CM

## 2013-11-21 LAB — CBC WITH DIFFERENTIAL/PLATELET
BASOS PCT: 0.3 % (ref 0.0–3.0)
Basophils Absolute: 0 10*3/uL (ref 0.0–0.1)
Eosinophils Absolute: 0.2 10*3/uL (ref 0.0–0.7)
Eosinophils Relative: 1.9 % (ref 0.0–5.0)
HEMATOCRIT: 44 % (ref 39.0–52.0)
Hemoglobin: 14.7 g/dL (ref 13.0–17.0)
LYMPHS ABS: 0.9 10*3/uL (ref 0.7–4.0)
Lymphocytes Relative: 10.7 % — ABNORMAL LOW (ref 12.0–46.0)
MCHC: 33.4 g/dL (ref 30.0–36.0)
MCV: 90.4 fl (ref 78.0–100.0)
MONO ABS: 0.9 10*3/uL (ref 0.1–1.0)
Monocytes Relative: 10.7 % (ref 3.0–12.0)
NEUTROS ABS: 6.1 10*3/uL (ref 1.4–7.7)
NEUTROS PCT: 76.4 % (ref 43.0–77.0)
Platelets: 184 10*3/uL (ref 150.0–400.0)
RBC: 4.86 Mil/uL (ref 4.22–5.81)
RDW: 15.9 % — ABNORMAL HIGH (ref 11.5–14.6)
WBC: 8 10*3/uL (ref 4.5–10.5)

## 2013-11-21 LAB — BASIC METABOLIC PANEL
BUN: 30 mg/dL — ABNORMAL HIGH (ref 6–23)
CHLORIDE: 100 meq/L (ref 96–112)
CO2: 28 meq/L (ref 19–32)
CREATININE: 1.6 mg/dL — AB (ref 0.4–1.5)
Calcium: 9 mg/dL (ref 8.4–10.5)
GFR: 46.86 mL/min — ABNORMAL LOW (ref >60.00)
Glucose, Bld: 79 mg/dL (ref 70–99)
Potassium: 4.8 mEq/L (ref 3.5–5.1)
Sodium: 135 mEq/L (ref 135–145)

## 2013-11-21 LAB — PROTIME-INR
INR: 4.3 ratio — ABNORMAL HIGH (ref 0.8–1.0)
PROTHROMBIN TIME: 46.1 s — AB (ref 9.6–13.1)

## 2013-11-22 ENCOUNTER — Encounter (HOSPITAL_COMMUNITY): Payer: Self-pay | Admitting: *Deleted

## 2013-11-22 ENCOUNTER — Encounter (HOSPITAL_COMMUNITY): Payer: Medicare Other | Admitting: Certified Registered"

## 2013-11-22 ENCOUNTER — Other Ambulatory Visit: Payer: Medicare Other

## 2013-11-22 ENCOUNTER — Encounter (HOSPITAL_COMMUNITY)
Admission: RE | Disposition: A | Discharge status: Home / Self Care | Payer: Medicare Other | Source: Ambulatory Visit | Attending: Internal Medicine

## 2013-11-22 ENCOUNTER — Ambulatory Visit (HOSPITAL_COMMUNITY): Payer: Medicare Other | Admitting: Certified Registered"

## 2013-11-22 ENCOUNTER — Ambulatory Visit (HOSPITAL_COMMUNITY)
Admission: RE | Admit: 2013-11-22 | Discharge: 2013-11-22 | Disposition: A | Discharge status: Home / Self Care | Payer: Medicare Other | Source: Ambulatory Visit | Attending: Internal Medicine | Admitting: Internal Medicine

## 2013-11-22 ENCOUNTER — Encounter: Payer: Self-pay | Admitting: Internal Medicine

## 2013-11-22 ENCOUNTER — Ambulatory Visit (INDEPENDENT_AMBULATORY_CARE_PROVIDER_SITE_OTHER): Payer: Medicare Other | Admitting: Pharmacist

## 2013-11-22 DIAGNOSIS — I472 Ventricular tachycardia, unspecified: Secondary | ICD-10-CM | POA: Insufficient documentation

## 2013-11-22 DIAGNOSIS — I059 Rheumatic mitral valve disease, unspecified: Secondary | ICD-10-CM

## 2013-11-22 DIAGNOSIS — I10 Essential (primary) hypertension: Secondary | ICD-10-CM | POA: Insufficient documentation

## 2013-11-22 DIAGNOSIS — Z954 Presence of other heart-valve replacement: Secondary | ICD-10-CM

## 2013-11-22 DIAGNOSIS — I4891 Unspecified atrial fibrillation: Secondary | ICD-10-CM | POA: Insufficient documentation

## 2013-11-22 DIAGNOSIS — G4730 Sleep apnea, unspecified: Secondary | ICD-10-CM | POA: Insufficient documentation

## 2013-11-22 DIAGNOSIS — I4729 Other ventricular tachycardia: Secondary | ICD-10-CM | POA: Insufficient documentation

## 2013-11-22 DIAGNOSIS — Z5181 Encounter for therapeutic drug level monitoring: Secondary | ICD-10-CM

## 2013-11-22 DIAGNOSIS — Z95810 Presence of automatic (implantable) cardiac defibrillator: Secondary | ICD-10-CM | POA: Insufficient documentation

## 2013-11-22 HISTORY — PX: CARDIOVERSION: SHX1299

## 2013-11-22 LAB — POCT INR: INR: 4.2

## 2013-11-22 SURGERY — CARDIOVERSION
Anesthesia: Monitor Anesthesia Care

## 2013-11-22 MED ORDER — PROPOFOL 10 MG/ML IV BOLUS
INTRAVENOUS | Status: DC | PRN
  Administered 2013-11-22: 70 mg via INTRAVENOUS

## 2013-11-22 MED ORDER — LIDOCAINE HCL (CARDIAC) 20 MG/ML IV SOLN
INTRAVENOUS | Status: DC | PRN
  Administered 2013-11-22: 40 mg via INTRAVENOUS

## 2013-11-22 MED ORDER — SODIUM CHLORIDE 0.9 % IV SOLN
INTRAVENOUS | Status: DC
  Administered 2013-11-22: 13:00:00 via INTRAVENOUS

## 2013-11-22 NOTE — Anesthesia Postprocedure Evaluation (Signed)
  Anesthesia Post-op Note  Patient: Austin [name]  Procedure(s) Performed: Procedure(s): CARDIOVERSION (N/A)  Patient Location: Endoscopy Unit  Anesthesia Type:MAC  Level of Consciousness: awake and alert   Airway and Oxygen Therapy: Patient Spontanous Breathing  Post-op Pain: none  Post-op Assessment: Post-op Vital signs reviewed, Patient's Cardiovascular Status Stable, Respiratory Function Stable, Patent Airway, No signs of Nausea or vomiting and Pain level controlled  Post-op Vital Signs: Reviewed and stable  Last Vitals:  Filed Vitals:   11/22/13 1430  BP: 121/67  Pulse: 52  Temp:   Resp: 15    Complications: No apparent anesthesia complications

## 2013-11-22 NOTE — Transfer of Care (Signed)
Immediate Anesthesia Transfer of Care Note  Patient: Austin [name]  Procedure(s) Performed: Procedure(s): CARDIOVERSION (N/A)  Patient Location: PACU and Endoscopy Unit  Anesthesia Type:MAC  Level of Consciousness: awake  Airway & Oxygen Therapy: Patient Spontanous Breathing and Patient connected to nasal cannula oxygen  Post-op Assessment: Report given to PACU RN and Post -op Vital signs reviewed and stable  Post vital signs: Reviewed and stable  Complications: No apparent anesthesia complications

## 2013-11-22 NOTE — Anesthesia Preprocedure Evaluation (Addendum)
Anesthesia Evaluation  Patient identified by MRN, date of birth, ID band Patient awake    Reviewed: Allergy & Precautions  History of Anesthesia Complications Negative for: history of anesthetic complications  Airway Mallampati: II TM Distance: >3 FB     Dental  (+) Dental Advisory Given   Pulmonary sleep apnea and Continuous Positive Airway Pressure Ventilation ,          Cardiovascular hypertension, Pt. on medications + dysrhythmias Atrial Fibrillation and Ventricular Tachycardia + Cardiac Defibrillator Rhythm:Irregular     Neuro/Psych negative neurological ROS  negative psych ROS   GI/Hepatic negative GI ROS, Neg liver ROS,   Endo/Other  negative endocrine ROS  Renal/GU negative Renal ROS     Musculoskeletal   Abdominal   Peds  Hematology negative hematology ROS (+)   Anesthesia Other Findings   Reproductive/Obstetrics                         Anesthesia Physical Anesthesia Plan  ASA: III  Anesthesia Plan: MAC   Post-op Pain Management:    Induction: Intravenous  Airway Management Planned: Mask  Additional Equipment: None  Intra-op Plan:   Post-operative Plan:   Informed Consent: I have reviewed the patients History and Physical, chart, labs and discussed the procedure including the risks, benefits and alternatives for the proposed anesthesia with the patient or authorized representative who has indicated his/her understanding and acceptance.   Dental advisory given  Plan Discussed with: CRNA, Anesthesiologist and Surgeon  Anesthesia Plan Comments:        Anesthesia Quick Evaluation

## 2013-11-22 NOTE — H&P (Signed)
Austin [name] is an 73 y.o. male.   Chief Complaint: " I am here for my cardioversion HPI: Patient is a very pleasant 73 year old man with multiple medical problems as noted below, who was found to be in atrial fibrillation with a rapid ventricular response, and now referred for cardioversion. He has been therapeutically anticoagulated. He denies syncope, or recent ICD shock.  Past Medical History  Diagnosis Date  . Nonischemic cardiomyopathy   . 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 Psychosexual dysfunction with inhibited sexual excitement   . Conductive hearing loss   . Syncope and collapse   . Atrial fibrillation   . Intraspinal abscess   . Mitral valve insufficiency and aortic valve insufficiency     s/p MVR mechanical  . S/P mitral valve replacement   . Unspecified sleep apnea     last sleep study 11/07  . Ventricular tachycardia     Past Surgical History  Procedure Laterality Date  . Mitral valve replacement      w #[address] [facility]  . Valve replacement  2000  . Evacution of epidural lumbar epidural abscess  1999  . Thyroidectomy    . Tonsillectomy    . Dental implants    . Cardiac catheterization  04/24/2002  . Insert / replace / remove pacemaker  11/2008  . Cardioversion  03/09/2012    Procedure: CARDIOVERSION;  Surgeon: Evans Lance, MD;  Location: [facility] ENDOSCOPY;  Service: Cardiovascular;  Laterality: N/A;    No family history on file. Social History:  reports that he has never smoked. He does not have any smokeless tobacco history on file. He reports that he drinks alcohol. He reports that he does not use illicit drugs.  Allergies: No Known Allergies   (Not in a hospital admission)  Results for orders placed in visit on 11/21/13 (from the past 48 hour(s))  BASIC METABOLIC PANEL     Status: Abnormal   Collection Time    11/21/13 11:59 AM      Result Value Ref Range   Sodium 135   135 - 145 mEq/L   Potassium 4.8  3.5 - 5.1 mEq/L   Chloride 100  96 - 112 mEq/L   CO2 28  19 - 32 mEq/L   Glucose, Bld 79  70 - 99 mg/dL   BUN 30 (*) 6 - 23 mg/dL   Creatinine, Ser 1.6 (*) 0.4 - 1.5 mg/dL   Calcium 9.0  8.4 - 10.5 mg/dL   GFR 46.86 (*) >60.00 mL/min  CBC WITH DIFFERENTIAL     Status: Abnormal   Collection Time    11/21/13 11:59 AM      Result Value Ref Range   WBC 8.0  4.5 - 10.5 K/uL   RBC 4.86  4.22 - 5.81 Mil/uL   Hemoglobin 14.7  13.0 - 17.0 g/dL   HCT 44.0  39.0 - 52.0 %   MCV 90.4  78.0 - 100.0 fl   MCHC 33.4  30.0 - 36.0 g/dL   RDW 15.9 (*) 11.5 - 14.6 %   Platelets 184.0  150.0 - 400.0 K/uL   Neutrophils Relative % 76.4  43.0 - 77.0 %   Lymphocytes Relative 10.7 (*) 12.0 - 46.0 %   Monocytes Relative 10.7  3.0 - 12.0 %   Eosinophils Relative 1.9  0.0 - 5.0 %   Basophils Relative 0.3  0.0 - 3.0 %   Neutro  Abs 6.1  1.4 - 7.7 K/uL   Lymphs Abs 0.9  0.7 - 4.0 K/uL   Monocytes Absolute 0.9  0.1 - 1.0 K/uL   Eosinophils Absolute 0.2  0.0 - 0.7 K/uL   Basophils Absolute 0.0  0.0 - 0.1 K/uL  PROTIME-INR     Status: Abnormal   Collection Time    11/21/13 11:59 AM      Result Value Ref Range   INR 4.3 (*) 0.8 - 1.0 ratio   Prothrombin Time 46.1 (*) 9.6 - 13.1 sec   No results found.  ROS all systems reviewed and negative except as noted in the history of present illness  There were no vitals taken for this visit. Physical Exam  Well appearing NAD HEENT: Unremarkable,[place], AT Neck:  6 JVD, no thyromegally Back:  No CVA tenderness Lungs:  Clear with no wheezes, rales, or rhonchi HEART:  Regular rate rhythm, no murmurs, no rubs, no clicks Abd:  soft, positive bowel sounds, no organomegally, no rebound, no guarding Ext:  2 plus pulses, no edema, no cyanosis, no clubbing Skin:  No rashes no nodules Neuro:  CN II through XII intact, motor grossly intact   EKG: Atrial fibrillation  Assessment/Plan 1. recurrent atrial fibrillation 2. chronic  anticoagulation 3. history of ventricular tachycardia on amiodarone 4. class II systolic heart failure Recommendation: The risk, goals, benefits, and expectations of cardioversion have been discussed with the patient and he wishes to proceed.  Champ Mungo Taylor,M.D. 11/22/2013, 1:01 PM

## 2013-11-22 NOTE — Op Note (Signed)
Procedure: Electrical Cardioversion Indications:  Atrial Fibrillation  Procedure Details:  Consent: Risks of procedure as well as the alternatives and risks of each were explained to the (patient/caregiver).  Consent for procedure obtained.  Time Out: Verified patient identification, verified procedure, site/side was marked, verified correct patient position, special equipment/implants available, medications/allergies/relevent history reviewed, required imaging and test results available.  Performed  Patient placed on cardiac monitor, pulse oximetry, supplemental oxygen as necessary.  Sedation given: Propofol 70 mg IV, Dr. Ermalene Postin Pacer pads placed anterior and posterior chest.  Cardioverted 1 time(s).  Cardioverted at 120J. Sync biphasic  Evaluation: Findings: Post procedure EKG shows: Sinus bradycardia 55 bpm. When programmed DDI 50 bpm there is RV pacing even at max AV delay. LV lead pacing thresholds are prohibitively high. Programmed VVI 40 bpm. Complications: None Patient did tolerate procedure well.  Time Spent Directly with the Patient:  45 minutes   Austin [name] 11/22/2013, 1:31 PM

## 2013-11-22 NOTE — Discharge Instructions (Signed)
Monitored Anesthesia Care  Monitored anesthesia care is an anesthesia service for a medical procedure. Anesthesia is the loss of the ability to feel pain. It is produced by medications called anesthetics. It may affect a small area of your body (local anesthesia), a large area of your body (regional anesthesia), or your entire body (general anesthesia). The need for monitored anesthesia care depends your procedure, your condition, and the potential need for regional or general anesthesia. It is often provided during procedures where:   General anesthesia may be needed if there are complications. This is because you need special care when you are under general anesthesia.   You will be under local or regional anesthesia. This is so that you are able to have higher levels of anesthesia if needed.   You will receive calming medications (sedatives). This is especially the case if sedatives are given to put you in a semi-conscious state of relaxation (deep sedation). This is because the amount of sedative needed to produce this state can be hard to predict. Too much of a sedative can produce general anesthesia. Monitored anesthesia care is performed by one or more caregivers who have special training in all types of anesthesia. You will need to meet with these caregivers before your procedure. During this meeting, they will ask you about your medical history. They will also give you instructions to follow. (For example, you will need to stop eating and drinking before your procedure. You may also need to stop or change medications you are taking.) During your procedure, your caregivers will stay with you. They will:   Watch your condition. This includes watching you blood pressure, breathing, and level of pain.   Diagnose and treat problems that occur.   Give medications if they are needed. These may include calming medications (sedatives) and anesthetics.   Make sure you are comfortable.  Having  monitored anesthesia care does not necessarily mean that you will be under anesthesia. It does mean that your caregivers will be able to manage anesthesia if you need it or if it occurs. It also means that you will be able to have a different type of anesthesia than you are having if you need it. When your procedure is complete, your caregivers will continue to watch your condition. They will make sure any medications wear off before you are allowed to go home.  Document Released: 04/07/2005 Document Revised: 11/06/2012 Document Reviewed: 08/23/2012 [facility] Patient Information 2014 [place], [place]. Cardioverter Defibrillator Implantation An implantable cardioverter defibrillator (ICD) is a small, lightweight, battery-powered device that is placed (implanted) under the skin in the chest or abdomen. Your caregiver may prescribe an ICD if:  You have had an irregular heart rhythm (arrhythmia) that originated in the lower chambers of the heart (ventricles).  Your heart has been damaged by a disease (such as coronary artery disease) or heart condition (such as a heart attack). An ICD consists of a battery that lasts several years, a small computer called a pulse generator, and wires called leads that go into the heart. It is used to detect and correct two dangerous arrhythmias: a rapid heart rhythm (tachycardia) and an arrhythmia in which the ventricles contract in an uncoordinated way (fibrillation). When an ICD detects tachycardia, it sends an electrical signal to the heart that restores the heartbeat to normal (cardioversion). This signal is usually painless. If cardioversion does not work or if the ICD detects fibrillation, it delivers a small electrical shock to the heart (defibrillation) to restart the heart.  The shock may feel like a strong jolt in the chest.ICDs may be programmed to correct other problems. Sometimes, ICDs are programmed to act as another type of implantable device called a pacemaker.  Pacemakers are used to treat a slow heartbeat (bradycardia). LET YOUR CAREGIVER KNOW ABOUT:  Any allergies you have.  All medicines you are taking, including vitamins, herbs, eyedrops, and over-the-counter medicines and creams.  Previous problems you or members of your family have had with the use of anesthetics.  Any blood disorders you have had.  Other health problems you have. RISKS AND COMPLICATIONS Generally, the procedure to implant an ICD is safe. However, as with any surgical procedure, complications can occur. Possible complications associated with implanting an ICD include:  Swelling, bleeding, or bruising at the site where the ICD was implanted.  Infection at the site where the ICD was implanted.  A reaction to medicine used during the procedure.  Nerve, heart, or blood vessel damage.  Blood clots. BEFORE THE PROCEDURE  You may need to have blood tests, heart tests, or a chest X-ray done before the day of the procedure.  Ask your caregiver about changing or stopping your regular medicines.  Make plans to have someone drive you home. You may need to stay in the hospital overnight after the procedure.  Stop smoking at least 24 hours before the procedure.  Take a bath or shower the night before the procedure. You may need to scrub your chest or abdomen with a special type of soap.  Do not eat or drink before your procedure for as long as directed by your caregiver. Ask if it is okay to take any needed medicine with a small sip of water. PROCEDURE  The procedure to implant an ICD in your chest or abdomen is usually done at a hospital in a room that has a large X-ray machine called a fluoroscope. The machine will be above you during the procedure. It will help your caregiver see your heart during the procedure. Implanting an ICD usually takes 1 3 hours. Before the procedure:   Small monitors will be put on your body. They will be used to check your heart, blood pressure,  and oxygen level.  A needle will be put into a vein in your hand or arm. This is called an intravenous (IV) access tube. Fluids and medicine will flow directly into your body through the IV tube.  Your chest or abdomen will be cleaned with a germ-killing (antiseptic) solution. The area may be shaved.  You may be given medicine to help you relax (sedative).  You will be given a medicine called a local anesthetic. This medicine will make the surgical site numb while the ICD is implanted. You will be sleepy but awake during the procedure. After you are numb the procedure will begin. The caregiver will:  Make a small cut (incision). This will make a pocket deep under your skin that will hold the pulse generator.  Guide the leads through a large blood vessel into your heart and attach them to the heart muscles. Depending on the ICD, the leads may go into one ventricle or they may go to both ventricles and into an upper chamber of the heart (atrium).  Test the ICD.  Close the incision with stitches, glue, or staples. AFTER THE PROCEDURE  You may feel pain. Some pain is normal. It may last a few days.  You may stay in a recovery area until the local anesthetic has worn off.  Your blood pressure and pulse will be checked often. You will be taken to a room where your heart will be monitored.  A chest X-ray will be taken. This is done to check that the cardioverter defibrillator is in the right place.  You may stay in the hospital overnight.  A slight bump may be seen over the skin where the ICD was placed. Sometimes, it is possible to feel the ICD under the skin. This is normal.  In the months and years afterward, your caregiver will check the device, the leads, and the battery every few months. Eventually, when the battery is low, the ICD will be replaced. Document Released: 04/03/2002 Document Revised: 05/02/2013 Document Reviewed: 07/31/2012 [facility] Patient Information 2014 [place],  [place]. Monitored Anesthesia Care  Monitored anesthesia care is an anesthesia service for a medical procedure. Anesthesia is the loss of the ability to feel pain. It is produced by medications called anesthetics. It may affect a small area of your body (local anesthesia), a large area of your body (regional anesthesia), or your entire body (general anesthesia). The need for monitored anesthesia care depends your procedure, your condition, and the potential need for regional or general anesthesia. It is often provided during procedures where:   General anesthesia may be needed if there are complications. This is because you need special care when you are under general anesthesia.   You will be under local or regional anesthesia. This is so that you are able to have higher levels of anesthesia if needed.   You will receive calming medications (sedatives). This is especially the case if sedatives are given to put you in a semi-conscious state of relaxation (deep sedation). This is because the amount of sedative needed to produce this state can be hard to predict. Too much of a sedative can produce general anesthesia. Monitored anesthesia care is performed by one or more caregivers who have special training in all types of anesthesia. You will need to meet with these caregivers before your procedure. During this meeting, they will ask you about your medical history. They will also give you instructions to follow. (For example, you will need to stop eating and drinking before your procedure. You may also need to stop or change medications you are taking.) During your procedure, your caregivers will stay with you. They will:   Watch your condition. This includes watching you blood pressure, breathing, and level of pain.   Diagnose and treat problems that occur.   Give medications if they are needed. These may include calming medications (sedatives) and anesthetics.   Make sure you are comfortable.   Having monitored anesthesia care does not necessarily mean that you will be under anesthesia. It does mean that your caregivers will be able to manage anesthesia if you need it or if it occurs. It also means that you will be able to have a different type of anesthesia than you are having if you need it. When your procedure is complete, your caregivers will continue to watch your condition. They will make sure any medications wear off before you are allowed to go home.  Document Released: 04/07/2005 Document Revised: 11/06/2012 Document Reviewed: 08/23/2012 [facility] Patient Information 2014 [place], [place].

## 2013-11-22 NOTE — Anesthesia Postprocedure Evaluation (Signed)
  Anesthesia Post-op Note  Patient: Austin [name]  Procedure(s) Performed: Procedure(s): CARDIOVERSION (N/A)  Patient Location: PACU and Endoscopy Unit  Anesthesia Type:MAC  Level of Consciousness: awake and alert   Airway and Oxygen Therapy: Patient Spontanous Breathing and Patient connected to nasal cannula oxygen  Post-op Pain: none  Post-op Assessment: Post-op Vital signs reviewed and Patient's Cardiovascular Status Stable  Post-op Vital Signs: Reviewed and stable  Last Vitals:  Filed Vitals:   11/22/13 1306  BP: 113/73  Temp:   Resp: 17    Complications: No apparent anesthesia complications

## 2013-11-23 ENCOUNTER — Encounter (HOSPITAL_COMMUNITY): Payer: Self-pay | Admitting: Cardiovascular Disease

## 2013-11-23 ENCOUNTER — Other Ambulatory Visit: Payer: Self-pay | Admitting: Internal Medicine

## 2013-11-27 ENCOUNTER — Other Ambulatory Visit: Payer: Medicare Other

## 2013-11-30 ENCOUNTER — Ambulatory Visit (INDEPENDENT_AMBULATORY_CARE_PROVIDER_SITE_OTHER): Payer: Medicare Other | Admitting: Internal Medicine

## 2013-11-30 DIAGNOSIS — Z5181 Encounter for therapeutic drug level monitoring: Secondary | ICD-10-CM

## 2013-11-30 DIAGNOSIS — I059 Rheumatic mitral valve disease, unspecified: Secondary | ICD-10-CM

## 2013-11-30 DIAGNOSIS — Z954 Presence of other heart-valve replacement: Secondary | ICD-10-CM

## 2013-11-30 LAB — POCT INR: INR: 3.4

## 2013-12-03 ENCOUNTER — Telehealth: Payer: Self-pay | Admitting: Internal Medicine

## 2013-12-03 NOTE — Telephone Encounter (Signed)
New message     Talk to Jeremy----pt did not tell me what he wanted

## 2013-12-03 NOTE — Telephone Encounter (Signed)
Patient wanted to confirm that he needed to check INR this week.  Patient instructed to check INR later this week given amiodarone use and recent elevated INR.  Patient shows verbal understanding.

## 2013-12-07 LAB — POCT INR: INR: 3.5

## 2013-12-10 ENCOUNTER — Telehealth: Payer: Self-pay | Admitting: Cardiology

## 2013-12-10 ENCOUNTER — Encounter: Payer: Medicare Other | Admitting: *Deleted

## 2013-12-10 NOTE — Telephone Encounter (Signed)
LMOVM reminding pt to send remote transmission.   

## 2013-12-11 ENCOUNTER — Encounter: Payer: Self-pay | Admitting: Cardiology

## 2013-12-14 ENCOUNTER — Ambulatory Visit (INDEPENDENT_AMBULATORY_CARE_PROVIDER_SITE_OTHER): Payer: Medicare Other | Admitting: Interventional Cardiology

## 2013-12-14 ENCOUNTER — Encounter: Payer: Self-pay | Admitting: Interventional Cardiology

## 2013-12-14 VITALS — BP 123/81 | HR 53 | Ht 74.0 in | Wt 207.0 lb

## 2013-12-14 DIAGNOSIS — Z95810 Presence of automatic (implantable) cardiac defibrillator: Secondary | ICD-10-CM

## 2013-12-14 DIAGNOSIS — I5022 Chronic systolic (congestive) heart failure: Secondary | ICD-10-CM

## 2013-12-14 DIAGNOSIS — Z79899 Other long term (current) drug therapy: Secondary | ICD-10-CM

## 2013-12-14 DIAGNOSIS — I059 Rheumatic mitral valve disease, unspecified: Secondary | ICD-10-CM

## 2013-12-14 DIAGNOSIS — I4891 Unspecified atrial fibrillation: Secondary | ICD-10-CM

## 2013-12-14 NOTE — Progress Notes (Signed)
Patient ID: Austin [name], male   DOB: 10/09/1940, 73 y.o.   MRN: [id number]    [address], [address] [place], [place]  [address] Phone: [phone] Fax:  [phone]  Date:  12/14/2013   ID:  Austin [name], DOB [birth year], MRN [id number]  PCP:  Horton Finer, MD   ASSESSMENT:  1. Paroxysmal atrial fibrillation with recent cardioversion by Dr. Tommie Raymond overall under Dr. Tanna Furry supervision 2. Amiodarone therapy, currently on 600 mg daily 3. Chronic anticoagulation therapy 4. Mechanical mitral valve 5. Extreme fatigue likely related to decrease chronotropic response do to amiodarone therapy 6. The patient has decreased his lisinopril dose to 10 mg per day because he feels so fatigued  PLAN:  1. Resume lisinopril 20 mg per day. 2. I explained that the lack of energy has possibly related to diminished chronotropic response do to high-dose amiodarone 3. Clinical followup in 6 months 4. INR should be rechecked in one week   SUBJECTIVE: Austin [name] is a 73 y.o. male who had an episode of atrial fibrillation for approximately 3 weeks that was finally terminated with electrical cardioversion performed by Dr. Sallyanne Kuster under Dr. Hillery Jacks supervision. Since that time he is felt markedly fatigued. His legs are heavy. He began to look for an explanation for the fatigue. He read the fatigue could be a side effect of lisinopril he cut the dose down to 10 mg per day from 20. We'll lisinopril because of LV systolic dysfunction. He has not noticed any significant improvement. He is still trying to be active but has a difficult time walking do to fatigue and decreased energy. He denies orthopnea, PND, and edema.   Wt Readings from Last 3 Encounters:  12/14/13 207 lb (93.895 kg)  11/22/13 204 lb (92.534 kg)  11/22/13 204 lb (92.534 kg)     Past Medical History  Diagnosis Date  . Nonischemic cardiomyopathy   . 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 Psychosexual dysfunction with inhibited sexual excitement   . Conductive hearing loss   . Syncope and collapse   . Atrial fibrillation   . Intraspinal abscess   . Mitral valve insufficiency and aortic valve insufficiency     s/p MVR mechanical  . S/P mitral valve replacement   . Unspecified sleep apnea     last sleep study 11/07  . Ventricular tachycardia     Current Outpatient Prescriptions  Medication Sig Dispense Refill  . amiodarone (PACERONE) 400 MG tablet Take 600 mg by mouth daily.      . folic acid (FOLVITE) 1 MG tablet Take 1 mg by mouth daily.       Marland Kitchen levothyroxine (SYNTHROID, LEVOTHROID) 75 MCG tablet TAKE 1 TABLET EVERY DAY  30 tablet  0  . lisinopril (PRINIVIL,ZESTRIL) 10 MG tablet Take 10 mg by mouth daily.      . Multiple Vitamins-Minerals (MULTIVITAL) tablet Take 1 tablet by mouth daily.       . Omega-3 Fatty Acids (FISH OIL) 1000 MG CAPS Take 1 capsule by mouth daily.       Marland Kitchen warfarin (COUMADIN) 10 MG tablet Take 5 mg by mouth daily. Take as directed by coumadin clinic       Current Facility-Administered Medications  Medication Dose Route Frequency Provider Last Rate Last Dose  . 0.9 %  sodium chloride infusion  250 mL Intravenous Continuous Evans Lance, MD      .  sodium chloride 0.9 % injection 3 mL  3 mL Intravenous Q12H Evans Lance, MD      . sodium chloride 0.9 % injection 3 mL  3 mL Intravenous PRN Evans Lance, MD        Allergies:   No Known Allergies  Social History:  The patient  reports that he has never smoked. He does not have any smokeless tobacco history on file. He reports that he drinks alcohol. He reports that he does not use illicit drugs.   ROS:  Please see the history of present illness.   Appetite is adequate. Exercise tolerance is decreased.   All other systems reviewed and negative.   OBJECTIVE: VS:  BP 123/81  Pulse 53  Ht 6\' 2"  (1.88 m)  Wt 207 lb (93.895 kg)   BMI 26.57 kg/m2 Well nourished, well developed, in no acute distress, appears healthy HEENT: normal Neck: JVD flat. Carotid bruit absent  Cardiac:  normal S1, S2; RRR; no murmur. Mechanical valve closure sounds are crisp. No MR. Lungs:  clear to auscultation bilaterally, no wheezing, rhonchi or rales Abd: soft, nontender, no hepatomegaly Ext: Edema absent. There is mild edema of the right upper extremity from recent torn biceps. The arm is discolored because of bleeding on Coumadin. Pulses 2+ and symmetric Skin: warm and dry Neuro:  CNs 2-12 intact, no focal abnormalities noted  EKG:   Not repeated       Signed, Illene Labrador III, MD 12/14/2013 12:24 PM

## 2013-12-14 NOTE — Patient Instructions (Signed)
Your physician recommends that you continue on your current medications as directed. Please refer to the Current Medication list given to you today.  Your physician wants you to follow-up in: 6 months. You will receive a reminder letter in the mail two months in advance. If you don't receive a letter, please call our office to schedule the follow-up appointment.  

## 2013-12-19 ENCOUNTER — Ambulatory Visit (INDEPENDENT_AMBULATORY_CARE_PROVIDER_SITE_OTHER): Payer: Medicare Other | Admitting: Cardiovascular Disease

## 2013-12-19 DIAGNOSIS — Z5181 Encounter for therapeutic drug level monitoring: Secondary | ICD-10-CM

## 2013-12-19 DIAGNOSIS — I059 Rheumatic mitral valve disease, unspecified: Secondary | ICD-10-CM

## 2013-12-19 DIAGNOSIS — Z954 Presence of other heart-valve replacement: Secondary | ICD-10-CM

## 2013-12-19 LAB — POCT INR: INR: 3.7

## 2013-12-21 ENCOUNTER — Other Ambulatory Visit: Payer: Self-pay | Admitting: Interventional Cardiology

## 2013-12-21 NOTE — Telephone Encounter (Signed)
OK TO REFILL AGAIN? PLEASE ADVISE. THANKS, MI

## 2013-12-31 ENCOUNTER — Other Ambulatory Visit (INDEPENDENT_AMBULATORY_CARE_PROVIDER_SITE_OTHER): Payer: Medicare Other

## 2013-12-31 DIAGNOSIS — Z79899 Other long term (current) drug therapy: Secondary | ICD-10-CM

## 2013-12-31 LAB — POCT INR: INR: 3.5

## 2013-12-31 LAB — HEPATIC FUNCTION PANEL
ALT: 30 U/L (ref 0–53)
AST: 30 U/L (ref 0–37)
Albumin: 3.6 g/dL (ref 3.5–5.2)
Alkaline Phosphatase: 55 U/L (ref 39–117)
BILIRUBIN DIRECT: 0.1 mg/dL (ref 0.0–0.3)
TOTAL PROTEIN: 6.4 g/dL (ref 6.0–8.3)
Total Bilirubin: 0.7 mg/dL (ref 0.2–1.2)

## 2013-12-31 LAB — TSH: TSH: 2.86 u[IU]/mL (ref 0.35–4.50)

## 2014-01-01 ENCOUNTER — Ambulatory Visit (INDEPENDENT_AMBULATORY_CARE_PROVIDER_SITE_OTHER): Payer: Medicare Other | Admitting: Internal Medicine

## 2014-01-01 ENCOUNTER — Encounter: Payer: Self-pay | Admitting: Internal Medicine

## 2014-01-01 ENCOUNTER — Ambulatory Visit (INDEPENDENT_AMBULATORY_CARE_PROVIDER_SITE_OTHER): Payer: Medicare Other | Admitting: Interventional Cardiology

## 2014-01-01 ENCOUNTER — Ambulatory Visit
Admission: RE | Admit: 2014-01-01 | Discharge: 2014-01-01 | Disposition: A | Discharge status: Home / Self Care | Payer: Medicare Other | Source: Ambulatory Visit | Attending: Internal Medicine | Admitting: Internal Medicine

## 2014-01-01 VITALS — BP 120/73 | HR 58 | Ht 74.0 in | Wt 204.0 lb

## 2014-01-01 DIAGNOSIS — Z79899 Other long term (current) drug therapy: Secondary | ICD-10-CM

## 2014-01-01 DIAGNOSIS — I472 Ventricular tachycardia, unspecified: Secondary | ICD-10-CM

## 2014-01-01 DIAGNOSIS — I5022 Chronic systolic (congestive) heart failure: Secondary | ICD-10-CM

## 2014-01-01 DIAGNOSIS — Z5181 Encounter for therapeutic drug level monitoring: Secondary | ICD-10-CM

## 2014-01-01 DIAGNOSIS — I4891 Unspecified atrial fibrillation: Secondary | ICD-10-CM

## 2014-01-01 DIAGNOSIS — Z954 Presence of other heart-valve replacement: Secondary | ICD-10-CM

## 2014-01-01 DIAGNOSIS — I428 Other cardiomyopathies: Secondary | ICD-10-CM

## 2014-01-01 DIAGNOSIS — I4729 Other ventricular tachycardia: Secondary | ICD-10-CM

## 2014-01-01 DIAGNOSIS — I059 Rheumatic mitral valve disease, unspecified: Secondary | ICD-10-CM

## 2014-01-01 LAB — MDC_IDC_ENUM_SESS_TYPE_INCLINIC
Brady Statistic AP VS Percent: 0 %
Brady Statistic AS VS Percent: 98.73 %
HIGH POWER IMPEDANCE MEASURED VALUE: 342 Ohm
HighPow Impedance: 190 Ohm
HighPow Impedance: 72 Ohm
Lead Channel Impedance Value: 456 Ohm
Lead Channel Impedance Value: 456 Ohm
Lead Channel Impedance Value: 456 Ohm
Lead Channel Impedance Value: 817 Ohm
Lead Channel Pacing Threshold Pulse Width: 0.4 ms
Lead Channel Sensing Intrinsic Amplitude: 0.5 mV
Lead Channel Sensing Intrinsic Amplitude: 0.875 mV
Lead Channel Setting Pacing Amplitude: 2.5 V
Lead Channel Setting Pacing Pulse Width: 0.4 ms
Lead Channel Setting Sensing Sensitivity: 0.3 mV
MDC IDC MSMT BATTERY VOLTAGE: 3.12 V
MDC IDC MSMT LEADCHNL RA IMPEDANCE VALUE: 513 Ohm
MDC IDC MSMT LEADCHNL RV PACING THRESHOLD AMPLITUDE: 1 V
MDC IDC MSMT LEADCHNL RV SENSING INTR AMPL: 11.375 mV
MDC IDC MSMT LEADCHNL RV SENSING INTR AMPL: 12 mV
MDC IDC SESS DTM: 20150609145433
MDC IDC SET ZONE DETECTION INTERVAL: 450 ms
MDC IDC STAT BRADY AP VP PERCENT: 0 %
MDC IDC STAT BRADY AS VP PERCENT: 1.27 %
MDC IDC STAT BRADY RA PERCENT PACED: 0 %
MDC IDC STAT BRADY RV PERCENT PACED: 1.27 %
Zone Setting Detection Interval: 250 ms
Zone Setting Detection Interval: 280 ms
Zone Setting Detection Interval: 340 ms
Zone Setting Detection Interval: 350 ms

## 2014-01-01 MED ORDER — AMIODARONE HCL 200 MG PO TABS: 400.0000 mg | ORAL_TABLET | Freq: Every day | ORAL | Status: DC

## 2014-01-01 NOTE — Assessment & Plan Note (Addendum)
He is maintaining NSR very nicely. Will have the patient reduce his dose of amio in 4 weeks to 300 mg daily. He will remain on his anti-coagulation and will check a CXR as he is on amiodarone. His liver panel and TSH are good and I have communicated this with him.

## 2014-01-01 NOTE — Patient Instructions (Signed)
Your physician has recommended you make the following change in your medication:  1) CONTINUE taking Amiodarone 400mg  for 4 weeks. Then REDUCE to 300mg  daily  A chest x-ray takes a picture of the organs and structures inside the chest, including the heart, lungs, and blood vessels. This test can show several things, including, whether the heart is enlarges; whether fluid is building up in the lungs; and whether pacemaker / defibrillator leads are still in place.  Remote monitoring is used to monitor your   ICD from home. This monitoring reduces the number of office visits required to check your device to one time per year. It allows [place] to keep an eye on the functioning of your device to ensure it is working properly. You are scheduled for a device check from home on 04/04/14. You may send your transmission at any time that day. If you have a wireless device, the transmission will be sent automatically. After your physician reviews your transmission, you will receive a postcard with your next transmission date.  Your physician wants you to follow-up in: 6 months with Dr.Taylor You will receive a reminder letter in the mail two months in advance. If you don't receive a letter, please call our office to schedule the follow-up appointment.

## 2014-01-01 NOTE — Assessment & Plan Note (Signed)
His VT remains quiet, will continue amio.

## 2014-01-01 NOTE — Progress Notes (Signed)
HPI [name] returns today for followup. He is a pleasant 73 yo man with a non-ischemic CM, chronic systolic heart failure, atrial fib, and VT. He is s/p ICD implant. In the interim, he has had recurrent atrial fib and underwent DCCV several weeks ago. He was placed on higher dose amiodarone and has maintained NSR very nicely.  He has had no syncope. No ICD shocks since February.  No Known Allergies   Current Outpatient Prescriptions  Medication Sig Dispense Refill  . amiodarone (PACERONE) 400 MG tablet Take 600 mg by mouth daily.      . folic acid (FOLVITE) 1 MG tablet Take 1 mg by mouth daily.       Marland Kitchen levothyroxine (SYNTHROID, LEVOTHROID) 75 MCG tablet TAKE 1 TABLET EVERY DAY  30 tablet  0  . lisinopril (PRINIVIL,ZESTRIL) 10 MG tablet Take 10 mg by mouth daily.      . Multiple Vitamins-Minerals (MULTIVITAL) tablet Take 1 tablet by mouth daily.       . Omega-3 Fatty Acids (FISH OIL) 1000 MG CAPS Take 1 capsule by mouth daily.       Marland Kitchen warfarin (COUMADIN) 10 MG tablet Take 5 mg by mouth daily. Take as directed by coumadin clinic       Current Facility-Administered Medications  Medication Dose Route Frequency Provider Last Rate Last Dose  . 0.9 %  sodium chloride infusion  250 mL Intravenous Continuous Evans Lance, MD      . sodium chloride 0.9 % injection 3 mL  3 mL Intravenous Q12H Evans Lance, MD      . sodium chloride 0.9 % injection 3 mL  3 mL Intravenous PRN Evans Lance, MD         Past Medical History  Diagnosis Date  . Nonischemic cardiomyopathy   . 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 Psychosexual dysfunction with inhibited sexual excitement   . Conductive hearing loss   . Syncope and collapse   . Atrial fibrillation   . Intraspinal abscess   . Mitral valve insufficiency and aortic valve insufficiency     s/p MVR mechanical  . S/P mitral valve replacement   . Unspecified sleep  apnea     last sleep study 11/07  . Ventricular tachycardia     ROS:   All systems reviewed and negative except as noted in the HPI.   Past Surgical History  Procedure Laterality Date  . Mitral valve replacement      w #[address]  . Valve replacement  2000  . Evacution of epidural lumbar epidural abscess  1999  . Thyroidectomy    . Tonsillectomy    . Dental implants    . Cardiac catheterization  04/24/2002  . Insert / replace / remove pacemaker  11/2008  . Cardioversion  03/09/2012    Procedure: CARDIOVERSION;  Surgeon: Evans Lance, MD;  Location: [facility];  Service: Cardiovascular;  Laterality: N/A;  . Cardioversion N/A 11/22/2013    Procedure: CARDIOVERSION;  Surgeon: Sanda Klein, MD;  Location: [facility];  Service: Cardiovascular;  Laterality: N/A;     No family history on file.   History   Social History  . Marital Status: Married    Spouse Name: N/A    Number of Children: N/A  . Years of Education: N/A   Occupational History  . Not on file.   Social History Main Topics  .  Smoking status: Never Smoker   . Smokeless tobacco: Not on file  . Alcohol Use: Yes     Comment: occasionally  . Drug Use: No  . Sexual Activity: Not on file   Other Topics Concern  . Not on file   Social History Narrative  . No narrative on file     BP 120/73  Pulse 58  Ht 6\' 2"  (1.88 m)  Wt 204 lb (92.534 kg)  BMI 26.18 kg/m2  Physical Exam:  Well appearing 73 year old man, NAD HEENT: Unremarkable Neck:  6 cm JVD, no thyromegally Back:  No CVA tenderness Lungs:  Clear with no wheezes, rales, or rhonchi.  Well-healed ICD incision HEART:  Regular rate rhythm, no murmurs, no rubs, no clicks, mechanical S1. Abd:  soft, positive bowel sounds, no organomegally, no rebound, no guarding Ext:  2 plus pulses, no edema, no cyanosis, no clubbing Skin:  No rashes no nodules Neuro:  CN II through XII intact, motor grossly intact  DEVICE  Normal device function.  See  PaceArt for details.   Assess/Plan:

## 2014-01-01 NOTE — Assessment & Plan Note (Signed)
His heart failure remains class 2A. He is class 3 in atrial fib. He will continue his current meds.

## 2014-01-02 ENCOUNTER — Telehealth: Payer: Self-pay

## 2014-01-02 NOTE — Telephone Encounter (Signed)
Message copied by Lamar Laundry on Wed Jan 02, 2014 12:56 PM ------      Message from: Daneen Schick      Created: Tue Jan 01, 2014  8:01 AM       Liver and thyroid are normal. ------

## 2014-01-02 NOTE — Telephone Encounter (Signed)
pt aware of lab results. Liver and thyroid are normal. pt verbalized understanding. 

## 2014-01-07 ENCOUNTER — Other Ambulatory Visit: Payer: Self-pay | Admitting: Internal Medicine

## 2014-01-08 ENCOUNTER — Encounter: Payer: Self-pay | Admitting: Internal Medicine

## 2014-01-15 LAB — POCT INR: INR: 3.3

## 2014-01-16 ENCOUNTER — Ambulatory Visit (INDEPENDENT_AMBULATORY_CARE_PROVIDER_SITE_OTHER): Payer: Medicare Other | Admitting: Cardiology

## 2014-01-16 DIAGNOSIS — Z5181 Encounter for therapeutic drug level monitoring: Secondary | ICD-10-CM

## 2014-01-16 DIAGNOSIS — Z954 Presence of other heart-valve replacement: Secondary | ICD-10-CM

## 2014-01-16 DIAGNOSIS — I059 Rheumatic mitral valve disease, unspecified: Secondary | ICD-10-CM

## 2014-01-21 ENCOUNTER — Other Ambulatory Visit: Payer: Self-pay | Admitting: Interventional Cardiology

## 2014-01-21 NOTE — Telephone Encounter (Signed)
Can we refill his thyroid medication?

## 2014-01-23 ENCOUNTER — Telehealth: Payer: Self-pay | Admitting: Internal Medicine

## 2014-01-23 NOTE — Telephone Encounter (Signed)
New message    Patient calling C/O dizziness, blurred vision , discuss a medication change - patient stated the nurse will know which  medication need to be change. Also did his transmittion go through or not haven't heard anything,.

## 2014-01-24 NOTE — Telephone Encounter (Signed)
Discussed with Dr Lovena Le, he suggest that he decrease his Amiodarone to 200mg  daily.  The remote transmission he sent does not show any VT of AFib.  Patient is aware and agrees with plan of care.  He is leaving for the beach on Sun and will be out of town until Fri.  He will call if he needs anything or has another spell.

## 2014-01-26 NOTE — Telephone Encounter (Signed)
We should have the PCP to do this.

## 2014-01-30 ENCOUNTER — Ambulatory Visit (INDEPENDENT_AMBULATORY_CARE_PROVIDER_SITE_OTHER): Payer: Medicare Other | Admitting: Cardiology

## 2014-01-30 DIAGNOSIS — Z5181 Encounter for therapeutic drug level monitoring: Secondary | ICD-10-CM

## 2014-01-30 DIAGNOSIS — Z954 Presence of other heart-valve replacement: Secondary | ICD-10-CM

## 2014-01-30 DIAGNOSIS — I059 Rheumatic mitral valve disease, unspecified: Secondary | ICD-10-CM

## 2014-01-30 LAB — POCT INR: INR: 3.3

## 2014-02-08 ENCOUNTER — Other Ambulatory Visit: Payer: Self-pay | Admitting: *Deleted

## 2014-02-08 MED ORDER — WARFARIN SODIUM 10 MG PO TABS: 10.0000 mg | ORAL_TABLET | ORAL | Status: DC

## 2014-02-12 LAB — POCT INR: INR: 3.6

## 2014-02-13 ENCOUNTER — Ambulatory Visit (INDEPENDENT_AMBULATORY_CARE_PROVIDER_SITE_OTHER): Payer: Medicare Other | Admitting: Internal Medicine

## 2014-02-13 DIAGNOSIS — Z5181 Encounter for therapeutic drug level monitoring: Secondary | ICD-10-CM

## 2014-02-13 DIAGNOSIS — I059 Rheumatic mitral valve disease, unspecified: Secondary | ICD-10-CM

## 2014-02-13 DIAGNOSIS — Z954 Presence of other heart-valve replacement: Secondary | ICD-10-CM

## 2014-02-25 NOTE — Telephone Encounter (Signed)
It would be okay to fill the thyroid if I am the one who started to

## 2014-02-27 ENCOUNTER — Ambulatory Visit (INDEPENDENT_AMBULATORY_CARE_PROVIDER_SITE_OTHER): Payer: Medicare Other | Admitting: Pharmacist

## 2014-02-27 DIAGNOSIS — Z5181 Encounter for therapeutic drug level monitoring: Secondary | ICD-10-CM

## 2014-02-27 DIAGNOSIS — I059 Rheumatic mitral valve disease, unspecified: Secondary | ICD-10-CM

## 2014-02-27 DIAGNOSIS — Z954 Presence of other heart-valve replacement: Secondary | ICD-10-CM

## 2014-02-27 LAB — POCT INR: INR: 4.1

## 2014-03-11 LAB — POCT INR: INR: 2.4

## 2014-03-12 ENCOUNTER — Ambulatory Visit (INDEPENDENT_AMBULATORY_CARE_PROVIDER_SITE_OTHER): Payer: Medicare Other | Admitting: Cardiology

## 2014-03-12 DIAGNOSIS — Z954 Presence of other heart-valve replacement: Secondary | ICD-10-CM

## 2014-03-12 DIAGNOSIS — Z5181 Encounter for therapeutic drug level monitoring: Secondary | ICD-10-CM

## 2014-03-12 DIAGNOSIS — I059 Rheumatic mitral valve disease, unspecified: Secondary | ICD-10-CM

## 2014-03-26 LAB — POCT INR: INR: 3.8

## 2014-03-27 ENCOUNTER — Ambulatory Visit (INDEPENDENT_AMBULATORY_CARE_PROVIDER_SITE_OTHER): Payer: Medicare Other | Admitting: Internal Medicine

## 2014-03-27 DIAGNOSIS — I059 Rheumatic mitral valve disease, unspecified: Secondary | ICD-10-CM

## 2014-03-27 DIAGNOSIS — Z954 Presence of other heart-valve replacement: Secondary | ICD-10-CM

## 2014-03-27 DIAGNOSIS — Z5181 Encounter for therapeutic drug level monitoring: Secondary | ICD-10-CM

## 2014-04-04 ENCOUNTER — Ambulatory Visit (INDEPENDENT_AMBULATORY_CARE_PROVIDER_SITE_OTHER): Payer: Medicare Other | Admitting: *Deleted

## 2014-04-04 ENCOUNTER — Telehealth: Payer: Self-pay | Admitting: Cardiology

## 2014-04-04 DIAGNOSIS — I4729 Other ventricular tachycardia: Secondary | ICD-10-CM

## 2014-04-04 DIAGNOSIS — I472 Ventricular tachycardia, unspecified: Secondary | ICD-10-CM

## 2014-04-04 DIAGNOSIS — I5022 Chronic systolic (congestive) heart failure: Secondary | ICD-10-CM

## 2014-04-04 LAB — MDC_IDC_ENUM_SESS_TYPE_REMOTE
Battery Voltage: 3.11 V
Brady Statistic AP VP Percent: 0 %
Brady Statistic AP VS Percent: 0 %
Brady Statistic AS VP Percent: 0.46 %
Brady Statistic AS VS Percent: 99.54 %
Brady Statistic RA Percent Paced: 0 %
Brady Statistic RV Percent Paced: 0.46 %
HighPow Impedance: 171 Ohm
HighPow Impedance: 304 Ohm
HighPow Impedance: 66 Ohm
Lead Channel Impedance Value: 418 Ohm
Lead Channel Impedance Value: 456 Ohm
Lead Channel Impedance Value: 456 Ohm
Lead Channel Pacing Threshold Pulse Width: 0.4 ms
Lead Channel Sensing Intrinsic Amplitude: 0.5 mV
Lead Channel Sensing Intrinsic Amplitude: 0.5 mV
Lead Channel Sensing Intrinsic Amplitude: 10 mV
Lead Channel Setting Pacing Amplitude: 2.5 V
Lead Channel Setting Pacing Pulse Width: 0.4 ms
MDC IDC MSMT LEADCHNL LV IMPEDANCE VALUE: 779 Ohm
MDC IDC MSMT LEADCHNL RV IMPEDANCE VALUE: 399 Ohm
MDC IDC MSMT LEADCHNL RV PACING THRESHOLD AMPLITUDE: 0.875 V
MDC IDC MSMT LEADCHNL RV SENSING INTR AMPL: 10 mV
MDC IDC SESS DTM: 20150910172024
MDC IDC SET LEADCHNL RV SENSING SENSITIVITY: 0.3 mV
MDC IDC SET ZONE DETECTION INTERVAL: 340 ms
MDC IDC SET ZONE DETECTION INTERVAL: 450 ms
Zone Setting Detection Interval: 250 ms
Zone Setting Detection Interval: 280 ms
Zone Setting Detection Interval: 350 ms

## 2014-04-04 NOTE — Telephone Encounter (Signed)
LMOVM reminding pt to send remote transmission.   

## 2014-04-04 NOTE — Progress Notes (Signed)
Remote ICD transmission.   

## 2014-04-16 ENCOUNTER — Other Ambulatory Visit: Payer: Self-pay | Admitting: *Deleted

## 2014-04-16 ENCOUNTER — Encounter: Payer: Self-pay | Admitting: Cardiology

## 2014-04-16 ENCOUNTER — Ambulatory Visit (INDEPENDENT_AMBULATORY_CARE_PROVIDER_SITE_OTHER): Payer: Medicare Other | Admitting: Cardiology

## 2014-04-16 DIAGNOSIS — Z954 Presence of other heart-valve replacement: Secondary | ICD-10-CM

## 2014-04-16 DIAGNOSIS — I059 Rheumatic mitral valve disease, unspecified: Secondary | ICD-10-CM

## 2014-04-16 DIAGNOSIS — Z5181 Encounter for therapeutic drug level monitoring: Secondary | ICD-10-CM

## 2014-04-16 LAB — POCT INR: INR: 2.9

## 2014-04-16 MED ORDER — WARFARIN SODIUM 10 MG PO TABS: 10.0000 mg | ORAL_TABLET | ORAL | Status: DC

## 2014-04-16 MED ORDER — WARFARIN SODIUM 5 MG PO TABS: ORAL_TABLET | ORAL | Status: DC

## 2014-04-16 NOTE — Telephone Encounter (Signed)
Patient requests the 5mg  tablet.

## 2014-04-18 ENCOUNTER — Encounter: Payer: Self-pay | Admitting: Internal Medicine

## 2014-04-19 ENCOUNTER — Other Ambulatory Visit: Payer: Self-pay | Admitting: Dermatology

## 2014-05-08 ENCOUNTER — Other Ambulatory Visit: Payer: Self-pay | Admitting: Internal Medicine

## 2014-05-08 ENCOUNTER — Ambulatory Visit (INDEPENDENT_AMBULATORY_CARE_PROVIDER_SITE_OTHER): Payer: Medicare Other | Admitting: Pharmacist

## 2014-05-08 DIAGNOSIS — Z5181 Encounter for therapeutic drug level monitoring: Secondary | ICD-10-CM

## 2014-05-08 LAB — POCT INR: INR: 3

## 2014-05-10 NOTE — Telephone Encounter (Signed)
Per note on 01/23/14

## 2014-05-13 NOTE — Telephone Encounter (Signed)
What is this?

## 2014-05-23 ENCOUNTER — Ambulatory Visit (INDEPENDENT_AMBULATORY_CARE_PROVIDER_SITE_OTHER): Payer: Medicare Other | Admitting: Internal Medicine

## 2014-05-23 DIAGNOSIS — Z5181 Encounter for therapeutic drug level monitoring: Secondary | ICD-10-CM

## 2014-05-23 LAB — POCT INR: INR: 3.6

## 2014-06-05 ENCOUNTER — Other Ambulatory Visit: Payer: Self-pay | Admitting: Gastroenterology

## 2014-06-07 ENCOUNTER — Encounter: Payer: Self-pay | Admitting: Internal Medicine

## 2014-06-07 ENCOUNTER — Ambulatory Visit (INDEPENDENT_AMBULATORY_CARE_PROVIDER_SITE_OTHER): Payer: Medicare Other | Admitting: Internal Medicine

## 2014-06-07 ENCOUNTER — Ambulatory Visit (INDEPENDENT_AMBULATORY_CARE_PROVIDER_SITE_OTHER)
Admission: RE | Admit: 2014-06-07 | Discharge: 2014-06-07 | Disposition: A | Discharge status: Home / Self Care | Payer: Medicare Other | Source: Ambulatory Visit | Attending: Internal Medicine | Admitting: Internal Medicine

## 2014-06-07 VITALS — BP 112/74 | HR 60 | Ht 73.5 in | Wt 208.6 lb

## 2014-06-07 DIAGNOSIS — I5022 Chronic systolic (congestive) heart failure: Secondary | ICD-10-CM

## 2014-06-07 DIAGNOSIS — I4729 Other ventricular tachycardia: Secondary | ICD-10-CM

## 2014-06-07 DIAGNOSIS — I472 Ventricular tachycardia: Secondary | ICD-10-CM

## 2014-06-07 DIAGNOSIS — E780 Pure hypercholesterolemia: Secondary | ICD-10-CM | POA: Diagnosis not present

## 2014-06-07 DIAGNOSIS — Z95810 Presence of automatic (implantable) cardiac defibrillator: Secondary | ICD-10-CM

## 2014-06-07 LAB — MDC_IDC_ENUM_SESS_TYPE_INCLINIC
Battery Voltage: 3.11 V
Brady Statistic AP VP Percent: 0 %
Brady Statistic AP VS Percent: 0 %
Brady Statistic AS VP Percent: 0.46 %
Brady Statistic AS VS Percent: 99.54 %
Brady Statistic RA Percent Paced: 0 %
Brady Statistic RV Percent Paced: 0.46 %
Date Time Interrogation Session: 20151113104953
HighPow Impedance: 190 Ohm
HighPow Impedance: 361 Ohm
HighPow Impedance: 68 Ohm
Lead Channel Impedance Value: 418 Ohm
Lead Channel Impedance Value: 418 Ohm
Lead Channel Impedance Value: 456 Ohm
Lead Channel Impedance Value: 475 Ohm
Lead Channel Impedance Value: 760 Ohm
Lead Channel Pacing Threshold Amplitude: 0.875 V
Lead Channel Pacing Threshold Pulse Width: 0.4 ms
Lead Channel Sensing Intrinsic Amplitude: 0.375 mV
Lead Channel Sensing Intrinsic Amplitude: 11.25 mV
Lead Channel Sensing Intrinsic Amplitude: 14 mV
Lead Channel Sensing Intrinsic Amplitude: 2.875 mV
Lead Channel Setting Pacing Amplitude: 2.5 V
Lead Channel Setting Pacing Pulse Width: 0.4 ms
Lead Channel Setting Sensing Sensitivity: 0.3 mV
Zone Setting Detection Interval: 250 ms
Zone Setting Detection Interval: 280 ms
Zone Setting Detection Interval: 340 ms
Zone Setting Detection Interval: 350 ms
Zone Setting Detection Interval: 450 ms

## 2014-06-07 LAB — TSH: TSH: 2.48 u[IU]/mL (ref 0.35–4.50)

## 2014-06-07 LAB — HEPATIC FUNCTION PANEL
ALK PHOS: 56 U/L (ref 39–117)
ALT: 32 U/L (ref 0–53)
AST: 53 U/L — AB (ref 0–37)
Albumin: 3.4 g/dL — ABNORMAL LOW (ref 3.5–5.2)
BILIRUBIN DIRECT: 0 mg/dL (ref 0.0–0.3)
Total Bilirubin: 0.6 mg/dL (ref 0.2–1.2)
Total Protein: 7 g/dL (ref 6.0–8.3)

## 2014-06-07 LAB — T4, FREE: Free T4: 1.39 ng/dL (ref 0.60–1.60)

## 2014-06-07 MED ORDER — AMIODARONE HCL 200 MG PO TABS: ORAL_TABLET | ORAL | Status: DC

## 2014-06-07 NOTE — Assessment & Plan Note (Signed)
His Medtronic biventricular ICD is working normally. His device has been reprogrammed to the VVI mode at 40 bpm, as his AV conduction has improved over the last few years for reasons I cannot explain.

## 2014-06-07 NOTE — Progress Notes (Signed)
HPI [name] returns today for followup. He is a pleasant 73 yo man with a non-ischemic CM, chronic systolic heart failure, atrial fib, and VT. He is s/p ICD implant. In the interim, he has had recurrent atrial fib and underwent DCCV several months ago. He was placed on higher dose amiodarone and has maintained NSR very nicely.  He has had no syncope. No ICD shocks since February. He is currently on 300 mg daily of amio. He would like to reduce his dose although he has had both atrial fib and VT on 200 mg of amio daily in the past. No Known Allergies   Current Outpatient Prescriptions  Medication Sig Dispense Refill  . amiodarone (PACERONE) 200 MG tablet Take 1 tablet (200 mg total) by mouth daily. (Patient taking differently: Take 300 mg by mouth daily. ) 180 tablet 2  . amoxicillin (AMOXIL) 500 MG capsule Take as directed before procedures  1  . folic acid (FOLVITE) 1 MG tablet Take 1 mg by mouth daily.     Marland Kitchen levothyroxine (SYNTHROID, LEVOTHROID) 75 MCG tablet TAKE 1 TABLET EVERY DAY (Patient taking differently: TAKE 1 TABLET BY MOUTH EVERY DAY) 30 tablet 9  . lisinopril (PRINIVIL,ZESTRIL) 20 MG tablet TAKE 1 TABLET (20 MG TOTAL) BY MOUTH DAILY. 30 tablet 6  . Multiple Vitamins-Minerals (MULTIVITAL) tablet Take 1 tablet by mouth daily.     . Omega-3 Fatty Acids (FISH OIL) 1000 MG CAPS Take 1 capsule by mouth daily.     Marland Kitchen warfarin (COUMADIN) 5 MG tablet Take as directed by coumadin clinic 90 tablet 1   Current Facility-Administered Medications  Medication Dose Route Frequency Provider Last Rate Last Dose  . 0.9 %  sodium chloride infusion  250 mL Intravenous Continuous Evans Lance, MD      . sodium chloride 0.9 % injection 3 mL  3 mL Intravenous Q12H Evans Lance, MD      . sodium chloride 0.9 % injection 3 mL  3 mL Intravenous PRN Evans Lance, MD         Past Medical History  Diagnosis Date  . Nonischemic cardiomyopathy   . 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 Psychosexual dysfunction with inhibited sexual excitement   . Conductive hearing loss   . Syncope and collapse   . Atrial fibrillation   . Intraspinal abscess   . Mitral valve insufficiency and aortic valve insufficiency     s/p MVR mechanical  . S/P mitral valve replacement   . Unspecified sleep apnea     last sleep study 11/07  . Ventricular tachycardia     ROS:   All systems reviewed and negative except as noted in the HPI.   Past Surgical History  Procedure Laterality Date  . Mitral valve replacement      w #[facility]  . Valve replacement  2000  . Evacution of epidural lumbar epidural abscess  1999  . Thyroidectomy    . Tonsillectomy    . Dental implants    . Cardiac catheterization  04/24/2002  . Insert / replace / remove pacemaker  11/2008  . Cardioversion  03/09/2012    Procedure: CARDIOVERSION;  Surgeon: Evans Lance, MD;  Location: [place];  Service: Cardiovascular;  Laterality: N/A;  . Cardioversion N/A 11/22/2013    Procedure: CARDIOVERSION;  Surgeon: Sanda [facility], MD;  Location: [facility];  Service: Cardiovascular;  Laterality: N/A;  History reviewed. No pertinent family history.   History   Social History  . Marital Status: Married    Spouse Name: N/A    Number of Children: N/A  . Years of Education: N/A   Occupational History  . Not on file.   Social History Main Topics  . Smoking status: Never Smoker   . Smokeless tobacco: Not on file  . Alcohol Use: Yes     Comment: occasionally  . Drug Use: No  . Sexual Activity: Not on file   Other Topics Concern  . Not on file   Social History Narrative     BP 112/74 mmHg  Pulse 60  Ht 6' 1.5" (1.867 m)  Wt 208 lb 9.6 oz (94.62 kg)  BMI 27.15 kg/m2  Physical Exam:  Well appearing 73 year old man, NAD HEENT: Unremarkable Neck:  6 cm JVD, no thyromegally Back:  No CVA tenderness Lungs:  Clear with no wheezes,  rales, or rhonchi.  Well-healed ICD incision HEART:  Regular rate rhythm, no murmurs, no rubs, no clicks, mechanical S1. Abd:  soft, positive bowel sounds, no organomegally, no rebound, no guarding Ext:  2 plus pulses, no edema, no cyanosis, no clubbing Skin:  No rashes no nodules Neuro:  CN II through XII intact, motor grossly intact  DEVICE  Normal device function.  See PaceArt for details.   Assess/Plan:

## 2014-06-07 NOTE — Assessment & Plan Note (Signed)
He is maintaining sinus rhythm very nicely. He has not had atrial fibrillation for approximately 6 months.

## 2014-06-07 NOTE — Patient Instructions (Addendum)
Remote monitoring is used to monitor your ICD from home. This monitoring reduces the number of office visits required to check your device to one time per year. It allows [place] to keep an eye on the functioning of your device to ensure it is working properly. You are scheduled for a device check from home on 09-09-2014. You may send your transmission at any time that day. If you have a wireless device, the transmission will be sent automatically. After your physician reviews your transmission, you will receive a postcard with your next transmission date.  Your physician recommends that you schedule a follow-up appointment in: 12 months with Dr.Taylor   Your physician has recommended you make the following change in your medication:  1) Take Amiodarone 300mg  Mon- Thurs and take 200mg  Fri-Sun  Your physician recommends that you return for lab work today: liver/tsh/Free T4  A chest x-ray takes a picture of the organs and structures inside the chest, including the heart, lungs, and blood vessels. This test can show several things, including, whether the heart is enlarges; whether fluid is building up in the lungs; and whether pacemaker / defibrillator leads are still in place.

## 2014-06-07 NOTE — Assessment & Plan Note (Signed)
His symptoms are currently well compensated and class IIa. He will continue his current medical therapy and maintain a low-sodium diet.

## 2014-06-07 NOTE — Assessment & Plan Note (Signed)
He is maintaining sinus rhythm very nicely. Will adjust his dose of amiodarone today so that he is taking 300 mg a day Monday through Thursday, and 200 mg a day Friday through Sunday. We'll check a chest x-ray, liver panel, and thyroid function studies as he has not had these in over a year secondary to his taking amiodarone.

## 2014-06-10 ENCOUNTER — Ambulatory Visit (INDEPENDENT_AMBULATORY_CARE_PROVIDER_SITE_OTHER): Payer: Medicare Other | Admitting: Cardiology

## 2014-06-10 DIAGNOSIS — Z5181 Encounter for therapeutic drug level monitoring: Secondary | ICD-10-CM

## 2014-06-10 LAB — POCT INR: INR: 3.4

## 2014-06-11 ENCOUNTER — Other Ambulatory Visit: Payer: Self-pay | Admitting: *Deleted

## 2014-06-11 DIAGNOSIS — R748 Abnormal levels of other serum enzymes: Secondary | ICD-10-CM

## 2014-06-23 LAB — POCT INR: INR: 2.2

## 2014-06-24 ENCOUNTER — Ambulatory Visit (INDEPENDENT_AMBULATORY_CARE_PROVIDER_SITE_OTHER): Payer: Medicare Other | Admitting: Cardiovascular Disease

## 2014-06-24 DIAGNOSIS — Z5181 Encounter for therapeutic drug level monitoring: Secondary | ICD-10-CM

## 2014-07-04 ENCOUNTER — Encounter (HOSPITAL_COMMUNITY): Payer: Self-pay | Admitting: Internal Medicine

## 2014-07-08 ENCOUNTER — Ambulatory Visit (INDEPENDENT_AMBULATORY_CARE_PROVIDER_SITE_OTHER): Payer: Medicare Other | Admitting: Cardiology

## 2014-07-08 DIAGNOSIS — Z5181 Encounter for therapeutic drug level monitoring: Secondary | ICD-10-CM

## 2014-07-08 LAB — POCT INR: INR: 3.2

## 2014-07-23 ENCOUNTER — Ambulatory Visit (INDEPENDENT_AMBULATORY_CARE_PROVIDER_SITE_OTHER): Payer: Medicare Other | Admitting: Cardiology

## 2014-07-23 DIAGNOSIS — Z5181 Encounter for therapeutic drug level monitoring: Secondary | ICD-10-CM

## 2014-07-23 LAB — POCT INR: INR: 3.3

## 2014-08-05 ENCOUNTER — Ambulatory Visit (INDEPENDENT_AMBULATORY_CARE_PROVIDER_SITE_OTHER): Payer: Self-pay | Admitting: *Deleted

## 2014-08-05 DIAGNOSIS — Z5181 Encounter for therapeutic drug level monitoring: Secondary | ICD-10-CM

## 2014-08-05 LAB — POCT INR: INR: 3.1

## 2014-08-12 ENCOUNTER — Other Ambulatory Visit: Payer: Self-pay | Admitting: Internal Medicine

## 2014-08-21 ENCOUNTER — Encounter: Payer: Self-pay | Admitting: Interventional Cardiology

## 2014-08-21 LAB — POCT INR: INR: 2.9

## 2014-08-21 LAB — PROTIME-INR: INR: 2.9 — AB (ref 0.9–1.1)

## 2014-08-22 ENCOUNTER — Ambulatory Visit (INDEPENDENT_AMBULATORY_CARE_PROVIDER_SITE_OTHER): Payer: Self-pay | Admitting: Pharmacist

## 2014-08-22 ENCOUNTER — Telehealth: Payer: Self-pay | Admitting: Internal Medicine

## 2014-08-22 DIAGNOSIS — Z5181 Encounter for therapeutic drug level monitoring: Secondary | ICD-10-CM

## 2014-08-22 NOTE — Telephone Encounter (Signed)
New Prob   If Home Health RN is calling please get Coumadin Nurse on the phone STAT  1.  Are you calling in regards to an appointment? No  2.  Are you calling for a refill ? No  3.  Are you having bleeding issues? No  4.  Do you need clearance to hold Coumadin? No     Pt requesting to speak to coumadin clinic regarding protime. Pt did not wish to leave much detail. Please call.

## 2014-08-22 NOTE — Telephone Encounter (Signed)
Spoke with pt.  Was calling to follow up on his INR results from yesterday.  See anti-coag note for details.

## 2014-09-04 LAB — POCT INR: INR: 2.9

## 2014-09-05 ENCOUNTER — Ambulatory Visit (INDEPENDENT_AMBULATORY_CARE_PROVIDER_SITE_OTHER): Payer: Self-pay | Admitting: Internal Medicine

## 2014-09-05 DIAGNOSIS — Z5181 Encounter for therapeutic drug level monitoring: Secondary | ICD-10-CM

## 2014-09-09 ENCOUNTER — Ambulatory Visit (INDEPENDENT_AMBULATORY_CARE_PROVIDER_SITE_OTHER): Payer: Medicare Other | Admitting: *Deleted

## 2014-09-09 ENCOUNTER — Other Ambulatory Visit: Payer: Medicare Other

## 2014-09-09 DIAGNOSIS — I429 Cardiomyopathy, unspecified: Secondary | ICD-10-CM

## 2014-09-09 DIAGNOSIS — I5022 Chronic systolic (congestive) heart failure: Secondary | ICD-10-CM

## 2014-09-09 NOTE — Progress Notes (Signed)
Remote ICD transmission.   

## 2014-09-10 LAB — MDC_IDC_ENUM_SESS_TYPE_REMOTE
Brady Statistic AP VP Percent: 0 %
Brady Statistic AP VS Percent: 0 %
Brady Statistic AS VP Percent: 0.24 %
Brady Statistic AS VS Percent: 99.76 %
Brady Statistic RV Percent Paced: 0.24 %
HighPow Impedance: 304 Ohm
HighPow Impedance: 76 Ohm
Lead Channel Impedance Value: 418 Ohm
Lead Channel Pacing Threshold Amplitude: 0.875 V
Lead Channel Pacing Threshold Pulse Width: 0.4 ms
Lead Channel Sensing Intrinsic Amplitude: 0.5 mV
Lead Channel Sensing Intrinsic Amplitude: 11.875 mV
Lead Channel Sensing Intrinsic Amplitude: 11.875 mV
Lead Channel Setting Pacing Amplitude: 2.5 V
Lead Channel Setting Pacing Pulse Width: 0.4 ms
Lead Channel Setting Sensing Sensitivity: 0.3 mV
MDC IDC MSMT BATTERY VOLTAGE: 3.09 V
MDC IDC MSMT LEADCHNL LV IMPEDANCE VALUE: 418 Ohm
MDC IDC MSMT LEADCHNL LV IMPEDANCE VALUE: 722 Ohm
MDC IDC MSMT LEADCHNL RA IMPEDANCE VALUE: 399 Ohm
MDC IDC MSMT LEADCHNL RA SENSING INTR AMPL: 0.5 mV
MDC IDC MSMT LEADCHNL RV IMPEDANCE VALUE: 361 Ohm
MDC IDC SESS DTM: 20160215083526
MDC IDC SET ZONE DETECTION INTERVAL: 250 ms
MDC IDC SET ZONE DETECTION INTERVAL: 280 ms
MDC IDC SET ZONE DETECTION INTERVAL: 350 ms
MDC IDC STAT BRADY RA PERCENT PACED: 0 %
Zone Setting Detection Interval: 340 ms
Zone Setting Detection Interval: 450 ms

## 2014-09-11 ENCOUNTER — Other Ambulatory Visit (INDEPENDENT_AMBULATORY_CARE_PROVIDER_SITE_OTHER): Payer: Medicare Other | Admitting: *Deleted

## 2014-09-11 DIAGNOSIS — R748 Abnormal levels of other serum enzymes: Secondary | ICD-10-CM

## 2014-09-11 LAB — HEPATIC FUNCTION PANEL
ALT: 23 U/L (ref 0–53)
AST: 27 U/L (ref 0–37)
Albumin: 4 g/dL (ref 3.5–5.2)
Alkaline Phosphatase: 55 U/L (ref 39–117)
Bilirubin, Direct: 0.1 mg/dL (ref 0.0–0.3)
Total Bilirubin: 0.6 mg/dL (ref 0.2–1.2)
Total Protein: 7.3 g/dL (ref 6.0–8.3)

## 2014-09-11 NOTE — Addendum Note (Signed)
Addended by: Eulis Foster on: 09/11/2014 07:59 AM   Modules accepted: Orders

## 2014-09-17 ENCOUNTER — Telehealth: Payer: Self-pay | Admitting: *Deleted

## 2014-09-17 NOTE — Telephone Encounter (Signed)
-----   Message from Evans Lance, MD sent at 09/16/2014  1:03 PM EST ----- LFT's have improved. GT ----- Message -----    From: [facility]    Sent: 09/11/2014   3:47 PM      To: Evans Lance, MD

## 2014-09-17 NOTE — Telephone Encounter (Signed)
Patient notified of lft's improvement.

## 2014-09-18 LAB — POCT INR: INR: 2.5

## 2014-09-19 ENCOUNTER — Ambulatory Visit (INDEPENDENT_AMBULATORY_CARE_PROVIDER_SITE_OTHER): Payer: Medicare Other | Admitting: Cardiovascular Disease

## 2014-09-19 ENCOUNTER — Telehealth: Payer: Self-pay | Admitting: Interventional Cardiology

## 2014-09-19 DIAGNOSIS — Z5181 Encounter for therapeutic drug level monitoring: Secondary | ICD-10-CM

## 2014-09-19 NOTE — Telephone Encounter (Signed)
New message      Returning someone in the coumadin clinic's call

## 2014-09-19 NOTE — Telephone Encounter (Signed)
Returned patients call and gave him coumadin dosing instructions, please see anticoagulant track. Amora Sheehy Bianca,RN

## 2014-09-20 ENCOUNTER — Encounter: Payer: Self-pay | Admitting: *Deleted

## 2014-10-01 ENCOUNTER — Encounter: Payer: Self-pay | Admitting: Internal Medicine

## 2014-10-04 ENCOUNTER — Encounter: Payer: Self-pay | Admitting: Cardiology

## 2014-10-04 ENCOUNTER — Ambulatory Visit (INDEPENDENT_AMBULATORY_CARE_PROVIDER_SITE_OTHER): Payer: Medicare Other

## 2014-10-04 DIAGNOSIS — Z5181 Encounter for therapeutic drug level monitoring: Secondary | ICD-10-CM

## 2014-10-04 LAB — POCT INR: INR: 4.1

## 2014-10-04 NOTE — Progress Notes (Signed)
This encounter was created in error - please disregard.

## 2014-10-17 ENCOUNTER — Other Ambulatory Visit: Payer: Self-pay | Admitting: Interventional Cardiology

## 2014-10-21 ENCOUNTER — Other Ambulatory Visit: Payer: Self-pay | Admitting: Interventional Cardiology

## 2014-10-22 ENCOUNTER — Ambulatory Visit (INDEPENDENT_AMBULATORY_CARE_PROVIDER_SITE_OTHER): Payer: Medicare Other | Admitting: Interventional Cardiology

## 2014-10-22 DIAGNOSIS — Z5181 Encounter for therapeutic drug level monitoring: Secondary | ICD-10-CM

## 2014-10-22 LAB — POCT INR: INR: 2.6

## 2014-11-01 ENCOUNTER — Ambulatory Visit (INDEPENDENT_AMBULATORY_CARE_PROVIDER_SITE_OTHER): Payer: Medicare Other | Admitting: Cardiovascular Disease

## 2014-11-01 DIAGNOSIS — Z5181 Encounter for therapeutic drug level monitoring: Secondary | ICD-10-CM

## 2014-11-01 LAB — POCT INR: INR: 3.4

## 2014-11-12 ENCOUNTER — Ambulatory Visit (INDEPENDENT_AMBULATORY_CARE_PROVIDER_SITE_OTHER): Payer: Medicare Other | Admitting: Interventional Cardiology

## 2014-11-12 DIAGNOSIS — Z5181 Encounter for therapeutic drug level monitoring: Secondary | ICD-10-CM

## 2014-11-12 LAB — POCT INR: INR: 3.1

## 2014-11-18 ENCOUNTER — Other Ambulatory Visit: Payer: Self-pay

## 2014-11-18 MED ORDER — LEVOTHYROXINE SODIUM 75 MCG PO TABS: 75.0000 ug | ORAL_TABLET | Freq: Every day | ORAL | Status: DC

## 2014-11-22 ENCOUNTER — Other Ambulatory Visit: Payer: Self-pay | Admitting: Interventional Cardiology

## 2014-11-22 NOTE — Telephone Encounter (Signed)
Ok to refill? Please advise. Thanks, MI 

## 2014-11-25 ENCOUNTER — Other Ambulatory Visit: Payer: Self-pay | Admitting: Dermatology

## 2014-11-27 ENCOUNTER — Ambulatory Visit (INDEPENDENT_AMBULATORY_CARE_PROVIDER_SITE_OTHER): Payer: Medicare Other | Admitting: Cardiology

## 2014-11-27 DIAGNOSIS — Z5181 Encounter for therapeutic drug level monitoring: Secondary | ICD-10-CM

## 2014-11-27 LAB — POCT INR: INR: 3

## 2014-12-09 ENCOUNTER — Encounter: Payer: Self-pay | Admitting: Internal Medicine

## 2014-12-09 ENCOUNTER — Ambulatory Visit (INDEPENDENT_AMBULATORY_CARE_PROVIDER_SITE_OTHER): Payer: Medicare Other | Admitting: *Deleted

## 2014-12-09 DIAGNOSIS — I5022 Chronic systolic (congestive) heart failure: Secondary | ICD-10-CM | POA: Diagnosis not present

## 2014-12-09 DIAGNOSIS — I472 Ventricular tachycardia: Secondary | ICD-10-CM

## 2014-12-09 DIAGNOSIS — I429 Cardiomyopathy, unspecified: Secondary | ICD-10-CM

## 2014-12-09 DIAGNOSIS — I4729 Other ventricular tachycardia: Secondary | ICD-10-CM

## 2014-12-09 NOTE — Progress Notes (Signed)
Remote ICD transmission.   

## 2014-12-11 ENCOUNTER — Telehealth: Payer: Self-pay | Admitting: Internal Medicine

## 2014-12-11 ENCOUNTER — Ambulatory Visit (INDEPENDENT_AMBULATORY_CARE_PROVIDER_SITE_OTHER): Payer: Medicare Other | Admitting: Cardiology

## 2014-12-11 DIAGNOSIS — Z5181 Encounter for therapeutic drug level monitoring: Secondary | ICD-10-CM

## 2014-12-11 LAB — POCT INR: INR: 3.6

## 2014-12-11 NOTE — Telephone Encounter (Signed)
Called patient to obtain INR results, please see anticoagulant encounter for today.  Midori Dado, Lauralyn Primes, RN

## 2014-12-11 NOTE — Telephone Encounter (Signed)
New problem   Pt stated no one has called him to retrieve his INR reading this week. Please call pt.

## 2014-12-12 LAB — CUP PACEART REMOTE DEVICE CHECK
Battery Voltage: 3.09 V
Brady Statistic AP VP Percent: 0 %
Brady Statistic AP VS Percent: 0 %
Brady Statistic AS VP Percent: 0.58 %
Brady Statistic AS VS Percent: 99.42 %
Brady Statistic RA Percent Paced: 0 %
Brady Statistic RV Percent Paced: 0.58 %
Date Time Interrogation Session: 20160516073627
HighPow Impedance: 304 Ohm
HighPow Impedance: 73 Ohm
Lead Channel Impedance Value: 399 Ohm
Lead Channel Impedance Value: 399 Ohm
Lead Channel Impedance Value: 418 Ohm
Lead Channel Pacing Threshold Amplitude: 0.875 V
Lead Channel Sensing Intrinsic Amplitude: 1.125 mV
Lead Channel Sensing Intrinsic Amplitude: 10.625 mV
Lead Channel Sensing Intrinsic Amplitude: 10.625 mV
Lead Channel Setting Pacing Amplitude: 2.5 V
Lead Channel Setting Pacing Pulse Width: 0.4 ms
MDC IDC MSMT LEADCHNL LV IMPEDANCE VALUE: 399 Ohm
MDC IDC MSMT LEADCHNL LV IMPEDANCE VALUE: 722 Ohm
MDC IDC MSMT LEADCHNL RA SENSING INTR AMPL: 1.125 mV
MDC IDC MSMT LEADCHNL RV PACING THRESHOLD PULSEWIDTH: 0.4 ms
MDC IDC SET LEADCHNL RV SENSING SENSITIVITY: 0.3 mV
MDC IDC SET ZONE DETECTION INTERVAL: 250 ms
MDC IDC SET ZONE DETECTION INTERVAL: 280 ms
MDC IDC SET ZONE DETECTION INTERVAL: 350 ms
MDC IDC SET ZONE DETECTION INTERVAL: 450 ms
Zone Setting Detection Interval: 340 ms

## 2014-12-25 ENCOUNTER — Ambulatory Visit (INDEPENDENT_AMBULATORY_CARE_PROVIDER_SITE_OTHER): Payer: Medicare Other | Admitting: Cardiology

## 2014-12-25 ENCOUNTER — Encounter: Payer: Self-pay | Admitting: Cardiology

## 2014-12-25 DIAGNOSIS — Z5181 Encounter for therapeutic drug level monitoring: Secondary | ICD-10-CM

## 2014-12-25 LAB — POCT INR: INR: 3.5

## 2015-01-08 ENCOUNTER — Telehealth: Payer: Self-pay | Admitting: Interventional Cardiology

## 2015-01-08 ENCOUNTER — Ambulatory Visit (INDEPENDENT_AMBULATORY_CARE_PROVIDER_SITE_OTHER): Payer: Medicare Other | Admitting: Internal Medicine

## 2015-01-08 DIAGNOSIS — Z5181 Encounter for therapeutic drug level monitoring: Secondary | ICD-10-CM

## 2015-01-08 LAB — POCT INR: INR: 3.9

## 2015-01-08 NOTE — Telephone Encounter (Signed)
See Anti-coag note  

## 2015-01-08 NOTE — Telephone Encounter (Signed)
If Home Health RN is calling please get Coumadin Nurse on the phone STAT  1.  Are you calling in regards to an appointment? No  2.  Are you calling for a refill ? No  3.  Are you having bleeding issues? No  4.  Do you need clearance to hold Coumadin? No   Comments: Pt is calling to give his most recent PT-INR results.

## 2015-01-23 ENCOUNTER — Ambulatory Visit (INDEPENDENT_AMBULATORY_CARE_PROVIDER_SITE_OTHER): Payer: Medicare Other | Admitting: Internal Medicine

## 2015-01-23 DIAGNOSIS — Z5181 Encounter for therapeutic drug level monitoring: Secondary | ICD-10-CM

## 2015-01-23 LAB — POCT INR: INR: 3.5

## 2015-02-10 ENCOUNTER — Ambulatory Visit (INDEPENDENT_AMBULATORY_CARE_PROVIDER_SITE_OTHER): Payer: Medicare Other | Admitting: Internal Medicine

## 2015-02-10 DIAGNOSIS — Z5181 Encounter for therapeutic drug level monitoring: Secondary | ICD-10-CM

## 2015-02-10 LAB — POCT INR: INR: 4.4

## 2015-02-24 ENCOUNTER — Ambulatory Visit (INDEPENDENT_AMBULATORY_CARE_PROVIDER_SITE_OTHER): Payer: Medicare Other | Admitting: Pharmacist

## 2015-02-24 DIAGNOSIS — Z5181 Encounter for therapeutic drug level monitoring: Secondary | ICD-10-CM

## 2015-02-24 LAB — POCT INR: INR: 3

## 2015-03-10 ENCOUNTER — Ambulatory Visit (INDEPENDENT_AMBULATORY_CARE_PROVIDER_SITE_OTHER): Payer: Medicare Other | Admitting: Cardiology

## 2015-03-10 DIAGNOSIS — Z5181 Encounter for therapeutic drug level monitoring: Secondary | ICD-10-CM

## 2015-03-10 LAB — POCT INR: INR: 4.3

## 2015-03-11 ENCOUNTER — Ambulatory Visit (INDEPENDENT_AMBULATORY_CARE_PROVIDER_SITE_OTHER): Payer: Medicare Other | Admitting: *Deleted

## 2015-03-11 DIAGNOSIS — I5022 Chronic systolic (congestive) heart failure: Secondary | ICD-10-CM

## 2015-03-11 DIAGNOSIS — I429 Cardiomyopathy, unspecified: Secondary | ICD-10-CM

## 2015-03-11 NOTE — Progress Notes (Signed)
Remote ICD transmission.   

## 2015-03-18 LAB — CUP PACEART REMOTE DEVICE CHECK
Brady Statistic AP VS Percent: 0 %
Brady Statistic AS VP Percent: 8.75 %
Brady Statistic AS VS Percent: 91.25 %
Brady Statistic RA Percent Paced: 0 %
Brady Statistic RV Percent Paced: 8.75 %
Date Time Interrogation Session: 20160816151649
HighPow Impedance: 342 Ohm
HighPow Impedance: 68 Ohm
Lead Channel Impedance Value: 399 Ohm
Lead Channel Impedance Value: 418 Ohm
Lead Channel Impedance Value: 456 Ohm
Lead Channel Impedance Value: 456 Ohm
Lead Channel Impedance Value: 779 Ohm
Lead Channel Pacing Threshold Amplitude: 0.875 V
Lead Channel Sensing Intrinsic Amplitude: 1.5 mV
Lead Channel Sensing Intrinsic Amplitude: 1.5 mV
Lead Channel Setting Pacing Amplitude: 2.5 V
Lead Channel Setting Pacing Pulse Width: 0.4 ms
MDC IDC MSMT BATTERY VOLTAGE: 3.06 V
MDC IDC MSMT LEADCHNL RV PACING THRESHOLD PULSEWIDTH: 0.4 ms
MDC IDC MSMT LEADCHNL RV SENSING INTR AMPL: 10 mV
MDC IDC MSMT LEADCHNL RV SENSING INTR AMPL: 10 mV
MDC IDC SET LEADCHNL RV SENSING SENSITIVITY: 0.3 mV
MDC IDC SET ZONE DETECTION INTERVAL: 450 ms
MDC IDC STAT BRADY AP VP PERCENT: 0 %
Zone Setting Detection Interval: 250 ms
Zone Setting Detection Interval: 280 ms
Zone Setting Detection Interval: 340 ms
Zone Setting Detection Interval: 350 ms

## 2015-03-24 ENCOUNTER — Ambulatory Visit (INDEPENDENT_AMBULATORY_CARE_PROVIDER_SITE_OTHER): Payer: Medicare Other | Admitting: Cardiovascular Disease

## 2015-03-24 DIAGNOSIS — Z5181 Encounter for therapeutic drug level monitoring: Secondary | ICD-10-CM

## 2015-03-24 LAB — POCT INR: INR: 3.5

## 2015-03-26 ENCOUNTER — Encounter: Payer: Self-pay | Admitting: Cardiology

## 2015-04-01 ENCOUNTER — Encounter: Payer: Self-pay | Admitting: Internal Medicine

## 2015-04-08 ENCOUNTER — Ambulatory Visit (INDEPENDENT_AMBULATORY_CARE_PROVIDER_SITE_OTHER): Payer: Medicare Other | Admitting: Internal Medicine

## 2015-04-08 DIAGNOSIS — Z5181 Encounter for therapeutic drug level monitoring: Secondary | ICD-10-CM

## 2015-04-08 LAB — POCT INR: INR: 5.1

## 2015-04-14 ENCOUNTER — Ambulatory Visit (INDEPENDENT_AMBULATORY_CARE_PROVIDER_SITE_OTHER): Payer: Medicare Other | Admitting: Pharmacist

## 2015-04-14 DIAGNOSIS — Z5181 Encounter for therapeutic drug level monitoring: Secondary | ICD-10-CM

## 2015-04-14 LAB — POCT INR: INR: 2.8

## 2015-04-25 ENCOUNTER — Ambulatory Visit (INDEPENDENT_AMBULATORY_CARE_PROVIDER_SITE_OTHER): Payer: Medicare Other | Admitting: Pharmacist

## 2015-04-25 DIAGNOSIS — Z5181 Encounter for therapeutic drug level monitoring: Secondary | ICD-10-CM

## 2015-04-25 LAB — POCT INR: INR: 3.7

## 2015-05-01 ENCOUNTER — Ambulatory Visit (INDEPENDENT_AMBULATORY_CARE_PROVIDER_SITE_OTHER): Payer: Medicare Other | Admitting: Internal Medicine

## 2015-05-01 DIAGNOSIS — Z5181 Encounter for therapeutic drug level monitoring: Secondary | ICD-10-CM

## 2015-05-01 LAB — POCT INR: INR: 3.5

## 2015-05-15 ENCOUNTER — Ambulatory Visit (INDEPENDENT_AMBULATORY_CARE_PROVIDER_SITE_OTHER): Payer: Medicare Other | Admitting: Cardiology

## 2015-05-15 DIAGNOSIS — Z5181 Encounter for therapeutic drug level monitoring: Secondary | ICD-10-CM

## 2015-05-15 LAB — POCT INR: INR: 4.5

## 2015-05-28 ENCOUNTER — Ambulatory Visit (INDEPENDENT_AMBULATORY_CARE_PROVIDER_SITE_OTHER): Payer: Medicare Other | Admitting: Internal Medicine

## 2015-05-28 DIAGNOSIS — Z5181 Encounter for therapeutic drug level monitoring: Secondary | ICD-10-CM

## 2015-05-28 LAB — POCT INR: INR: 4

## 2015-06-02 ENCOUNTER — Telehealth: Payer: Self-pay | Admitting: Internal Medicine

## 2015-06-02 NOTE — Telephone Encounter (Signed)
lmom for patient that he will need labs, as far as the echo I wasn't sure and will need a follow up with Dr Tamala Julian in 6 months from seeing Dr Lovena Le

## 2015-06-02 NOTE — Telephone Encounter (Signed)
New message (General)   Pt request a call back to determine if labs are needed for this appt. Will he need an Ultrasound and does he need a follow up appt with Dr. Tamala Julian. Please call

## 2015-06-10 ENCOUNTER — Ambulatory Visit (INDEPENDENT_AMBULATORY_CARE_PROVIDER_SITE_OTHER): Payer: Medicare Other | Admitting: Internal Medicine

## 2015-06-10 ENCOUNTER — Encounter: Payer: Self-pay | Admitting: Internal Medicine

## 2015-06-10 VITALS — BP 100/60 | HR 60 | Ht 73.5 in | Wt 207.8 lb

## 2015-06-10 DIAGNOSIS — I481 Persistent atrial fibrillation: Secondary | ICD-10-CM | POA: Diagnosis not present

## 2015-06-10 DIAGNOSIS — Z95810 Presence of automatic (implantable) cardiac defibrillator: Secondary | ICD-10-CM

## 2015-06-10 DIAGNOSIS — I480 Paroxysmal atrial fibrillation: Secondary | ICD-10-CM | POA: Diagnosis not present

## 2015-06-10 DIAGNOSIS — I4729 Other ventricular tachycardia: Secondary | ICD-10-CM

## 2015-06-10 DIAGNOSIS — I4819 Other persistent atrial fibrillation: Secondary | ICD-10-CM

## 2015-06-10 DIAGNOSIS — I5022 Chronic systolic (congestive) heart failure: Secondary | ICD-10-CM

## 2015-06-10 DIAGNOSIS — I472 Ventricular tachycardia: Secondary | ICD-10-CM | POA: Diagnosis not present

## 2015-06-10 LAB — CUP PACEART INCLINIC DEVICE CHECK
Battery Voltage: 3.05 V
Brady Statistic AS VS Percent: 98.67 %
Brady Statistic RA Percent Paced: 0 %
HIGH POWER IMPEDANCE MEASURED VALUE: 342 Ohm
HIGH POWER IMPEDANCE MEASURED VALUE: 68 Ohm
Implantable Lead Implant Date: 20130110
Implantable Lead Implant Date: 20130110
Implantable Lead Location: 753859
Implantable Lead Model: 5076
Implantable Lead Model: 6935
Lead Channel Impedance Value: 399 Ohm
Lead Channel Impedance Value: 418 Ohm
Lead Channel Impedance Value: 456 Ohm
Lead Channel Impedance Value: 475 Ohm
Lead Channel Impedance Value: 779 Ohm
Lead Channel Pacing Threshold Pulse Width: 0.4 ms
Lead Channel Sensing Intrinsic Amplitude: 1.75 mV
Lead Channel Sensing Intrinsic Amplitude: 11.875 mV
Lead Channel Setting Pacing Amplitude: 2.5 V
Lead Channel Setting Pacing Pulse Width: 0.4 ms
MDC IDC LEAD IMPLANT DT: 20130110
MDC IDC LEAD LOCATION: 753858
MDC IDC LEAD LOCATION: 753860
MDC IDC LEAD MODEL: 4196
MDC IDC MSMT LEADCHNL RV PACING THRESHOLD AMPLITUDE: 1 V
MDC IDC SESS DTM: 20161115161225
MDC IDC SET LEADCHNL RV SENSING SENSITIVITY: 0.3 mV
MDC IDC STAT BRADY AP VP PERCENT: 0 %
MDC IDC STAT BRADY AP VS PERCENT: 0 %
MDC IDC STAT BRADY AS VP PERCENT: 1.33 %
MDC IDC STAT BRADY RV PERCENT PACED: 1.33 %

## 2015-06-10 NOTE — Patient Instructions (Signed)
Medication Instructions:  Your physician recommends that you continue on your current medications as directed. Please refer to the Current Medication list given to you today.  Labwork: None rodered  Testing/Procedures: None ordered  Follow-Up: Remote monitoring is used to monitor your Pacemaker of ICD from home. This monitoring reduces the number of office visits required to check your device to one time per year. It allows [place] to keep an eye on the functioning of your device to ensure it is working properly. You are scheduled for a device check from home on 09/09/2015. You may send your transmission at any time that day. If you have a wireless device, the transmission will be sent automatically. After your physician reviews your transmission, you will receive a postcard with your next transmission date.  Your physician wants you to follow-up in: 1 year with Dr. Lovena Le. You will receive a reminder letter in the mail two months in advance. If you don't receive a letter, please call our office to schedule the follow-up appointment.   If you need a refill on your cardiac medications before your next appointment, please call your pharmacy.  Thank you for choosing [facility]!!

## 2015-06-10 NOTE — Assessment & Plan Note (Signed)
He has had no recurrent VT. He will continue his current meds. If he has much more VT, would consider catheter ablation.

## 2015-06-10 NOTE — Assessment & Plan Note (Signed)
His medtronic BiV ICD is working normally and is programmed VVI 60. Will follow.

## 2015-06-10 NOTE — Progress Notes (Signed)
HPI [name] returns today for followup. He is a pleasant 74 yo man with a non-ischemic CM, chronic systolic heart failure, atrial fib, and VT. He is s/p ICD implant. He was placed on higher dose amiodarone and has not had any sustained VT or ICD shocks.  He has had no syncope. He has noted some peripheral edema but notes that he does not have sob. No syncope. He is pending a trip to [place]. No Known Allergies   Current Outpatient Prescriptions  Medication Sig Dispense Refill  . amiodarone (PACERONE) 200 MG tablet Take 1 1/2 tablets by mouth Mon-Thurs and 1 tablet by mouth Fri-Sun 180 tablet 2  . amoxicillin (AMOXIL) 500 MG capsule Take as directed before procedures  1  . folic acid (FOLVITE) 1 MG tablet Take 1 mg by mouth daily.     Marland Kitchen levothyroxine (SYNTHROID, LEVOTHROID) 75 MCG tablet Take 1 tablet (75 mcg total) by mouth daily. 30 tablet 6  . lisinopril (PRINIVIL,ZESTRIL) 10 MG tablet Take 10 mg by mouth daily.  9  . Multiple Vitamins-Minerals (MULTIVITAL) tablet Take 1 tablet by mouth daily.     . Omega-3 Fatty Acids (FISH OIL) 1000 MG CAPS Take 1 capsule by mouth daily.     Marland Kitchen warfarin (COUMADIN) 5 MG tablet TAKE AS DIRECTED BY COUMADIN CLINIC 90 tablet 0   Current Facility-Administered Medications  Medication Dose Route Frequency Provider Last Rate Last Dose  . 0.9 %  sodium chloride infusion  250 mL Intravenous Continuous Evans Lance, MD      . sodium chloride 0.9 % injection 3 mL  3 mL Intravenous Q12H Evans Lance, MD      . sodium chloride 0.9 % injection 3 mL  3 mL Intravenous PRN Evans Lance, MD         Past Medical History  Diagnosis Date  . Nonischemic cardiomyopathy ([place])   . 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 Psychosexual dysfunction with inhibited sexual excitement   . Conductive hearing loss   . Syncope and collapse   . Atrial fibrillation ([place])   . Intraspinal abscess    . Mitral valve insufficiency and aortic valve insufficiency     s/p MVR mechanical  . S/P mitral valve replacement   . Unspecified sleep apnea     last sleep study 11/07  . Ventricular tachycardia ([place])     ROS:   All systems reviewed and negative except as noted in the HPI.   Past Surgical History  Procedure Laterality Date  . Mitral valve replacement      w #[address] [facility]  . Valve replacement  2000  . Evacution of epidural lumbar epidural abscess  1999  . Thyroidectomy    . Tonsillectomy    . Dental implants    . Cardiac catheterization  04/24/2002  . Insert / replace / remove pacemaker  11/2008  . Cardioversion  03/09/2012    Procedure: CARDIOVERSION;  Surgeon: Evans [facility], MD;  Location: [place];  Service: Cardiovascular;  Laterality: N/A;  . Cardioversion N/A 11/22/2013    Procedure: CARDIOVERSION;  Surgeon: Sanda [facility], MD;  Location: [facility] [facility];  Service: Cardiovascular;  Laterality: N/A;  . Permanent pacemaker insertion N/A 08/05/2011    Procedure: PERMANENT PACEMAKER INSERTION;  Surgeon: Evans [facility], MD;  Location: [facility];  Service: Cardiovascular;  Laterality: N/A;     Family History  Problem Relation Age  of Onset  . Heart disease Mother   . Heart failure Mother   . Heart disease Father   . Heart failure Father      Social History   Social History  . Marital Status: Married    Spouse Name: N/A  . Number of Children: N/A  . Years of Education: N/A   Occupational History  . Not on file.   Social History Main Topics  . Smoking status: Never Smoker   . Smokeless tobacco: Not on file  . Alcohol Use: Yes     Comment: occasionally  . Drug Use: No  . Sexual Activity: Not on file   Other Topics Concern  . Not on file   Social History Narrative     BP 100/60 mmHg  Pulse 60  Ht 6' 1.5" (1.867 m)  Wt 207 lb 12.8 oz (94.257 kg)  BMI 27.04 kg/m2  SpO2 97%  Physical Exam:  Well appearing 74 year old man, NAD HEENT:  Unremarkable Neck:  7 cm JVD, no thyromegally Back:  No CVA tenderness Lungs:  Clear with no wheezes, rales, or rhonchi.  Well-healed ICD incision HEART:  Regular rate rhythm, no murmurs, no rubs, no clicks, mechanical S1. Abd:  soft, positive bowel sounds, no organomegally, no rebound, no guarding Ext:  2 plus pulses, trace peripheral edema, no cyanosis, no clubbing Skin:  No rashes no nodules Neuro:  CN II through XII intact, motor grossly intact  DEVICE  Normal device function.  See PaceArt for details.   Assess/Plan:

## 2015-06-10 NOTE — Assessment & Plan Note (Signed)
He has been in atrial fib for the past 6 months and has been minimally symptomatic. I considered DCCV but his symptoms do not warrant and the likelihood of maintaining NSR is low. His rate is controlled.

## 2015-06-10 NOTE — Assessment & Plan Note (Signed)
His symptoms are class 2. He will continue his current meds. His fluid index is low.

## 2015-06-12 ENCOUNTER — Ambulatory Visit (INDEPENDENT_AMBULATORY_CARE_PROVIDER_SITE_OTHER): Payer: Medicare Other | Admitting: Cardiovascular Disease

## 2015-06-12 DIAGNOSIS — Z5181 Encounter for therapeutic drug level monitoring: Secondary | ICD-10-CM

## 2015-06-12 LAB — POCT INR: INR: 2.6

## 2015-06-13 ENCOUNTER — Telehealth: Payer: Self-pay | Admitting: Internal Medicine

## 2015-06-13 ENCOUNTER — Other Ambulatory Visit: Payer: Self-pay | Admitting: Internal Medicine

## 2015-06-13 DIAGNOSIS — I4729 Other ventricular tachycardia: Secondary | ICD-10-CM

## 2015-06-13 DIAGNOSIS — I472 Ventricular tachycardia: Secondary | ICD-10-CM

## 2015-06-13 MED ORDER — AMIODARONE HCL 200 MG PO TABS: ORAL_TABLET | ORAL | Status: DC

## 2015-06-13 NOTE — Telephone Encounter (Signed)
Dr Lovena Le would prefer he get from PCP.

## 2015-06-13 NOTE — Telephone Encounter (Signed)
Please advise 

## 2015-06-13 NOTE — Telephone Encounter (Signed)
New message     New presc for amiodarone has not been called in to [facility].  The dosage changed to 1.5 tablets mon-thurs and 1 tablet fri-sun.  If there is a problem, please call pt.

## 2015-06-26 ENCOUNTER — Ambulatory Visit (INDEPENDENT_AMBULATORY_CARE_PROVIDER_SITE_OTHER): Payer: Medicare Other | Admitting: Interventional Cardiology

## 2015-06-26 DIAGNOSIS — Z5181 Encounter for therapeutic drug level monitoring: Secondary | ICD-10-CM

## 2015-06-26 LAB — POCT INR: INR: 3.9

## 2015-07-02 ENCOUNTER — Ambulatory Visit (INDEPENDENT_AMBULATORY_CARE_PROVIDER_SITE_OTHER): Payer: Medicare Other | Admitting: Podiatry

## 2015-07-02 ENCOUNTER — Encounter: Payer: Self-pay | Admitting: Podiatry

## 2015-07-02 VITALS — BP 128/78 | HR 65 | Resp 16

## 2015-07-02 DIAGNOSIS — M779 Enthesopathy, unspecified: Secondary | ICD-10-CM

## 2015-07-02 DIAGNOSIS — L84 Corns and callosities: Secondary | ICD-10-CM | POA: Diagnosis not present

## 2015-07-02 DIAGNOSIS — M2040 Other hammer toe(s) (acquired), unspecified foot: Secondary | ICD-10-CM

## 2015-07-02 MED ORDER — TRIAMCINOLONE ACETONIDE 10 MG/ML IJ SUSP
10.0000 mg | Freq: Once | INTRAMUSCULAR | Status: AC
  Administered 2015-07-02: 10 mg

## 2015-07-02 NOTE — Progress Notes (Signed)
   Subjective:    Patient ID: Austin [name], male    DOB: 1940/10/24, 74 y.o.   MRN: [id number]  HPI Pt presents with painful hammertoe deformity causing a callus formation on the left 2nd toe that has become thick and painful   Review of Systems  All other systems reviewed and are negative.      Objective:   Physical Exam        Assessment & Plan:

## 2015-07-03 NOTE — Progress Notes (Signed)
Subjective:     Patient ID: Austin [name], male   DOB: 1941-05-27, 74 y.o.   MRN: [id number]  HPI patient points the end of second toe left stating it's been getting increasingly tender and thick and he has trouble wearing shoe gear   Review of Systems  All other systems reviewed and are negative.      Objective:   Physical Exam  Constitutional: He is oriented to person, place, and time.  Cardiovascular: Intact distal pulses.   Musculoskeletal: Normal range of motion.  Neurological: He is oriented to person, place, and time.  Skin: Skin is warm.  Nursing note and vitals reviewed.  neurovascular status found to be intact with muscle strength adequate range of motion within normal limits with patient noted to have a significant elevation second digit left with rigid contracture and large keratotic lesion at the proximal interphalangeal joint with fluid buildup and pain. Noted to have good digital perfusion and is well oriented 3     Assessment:     Hammertoe deformity second left with structural changes to the toe and inflammatory keratotic lesion with fluid buildup at the interphalangeal joint    Plan:     H&P and x-rays reviewed with patient. Today I did a proximal nerve block I then after appropriate numbness did a interphalangeal joint capsular injection 2 mg of dextran some Kenalog deep debridement of lesion and padded. Discussed possible digital fusion and reviewed that with patient

## 2015-07-10 ENCOUNTER — Ambulatory Visit (INDEPENDENT_AMBULATORY_CARE_PROVIDER_SITE_OTHER): Payer: Medicare Other | Admitting: Cardiovascular Disease

## 2015-07-10 DIAGNOSIS — Z5181 Encounter for therapeutic drug level monitoring: Secondary | ICD-10-CM

## 2015-07-10 LAB — POCT INR: INR: 3.6

## 2015-07-29 ENCOUNTER — Ambulatory Visit (INDEPENDENT_AMBULATORY_CARE_PROVIDER_SITE_OTHER): Payer: Medicare Other | Admitting: Internal Medicine

## 2015-07-29 DIAGNOSIS — Z5181 Encounter for therapeutic drug level monitoring: Secondary | ICD-10-CM

## 2015-07-29 LAB — POCT INR: INR: 3.7

## 2015-08-13 ENCOUNTER — Ambulatory Visit (INDEPENDENT_AMBULATORY_CARE_PROVIDER_SITE_OTHER): Payer: Medicare Other | Admitting: Cardiology

## 2015-08-13 DIAGNOSIS — Z5181 Encounter for therapeutic drug level monitoring: Secondary | ICD-10-CM

## 2015-08-13 LAB — POCT INR: INR: 2.7

## 2015-08-14 ENCOUNTER — Other Ambulatory Visit: Payer: Self-pay | Admitting: Interventional Cardiology

## 2015-08-18 ENCOUNTER — Encounter: Payer: Self-pay | Admitting: Podiatry

## 2015-08-18 ENCOUNTER — Ambulatory Visit (INDEPENDENT_AMBULATORY_CARE_PROVIDER_SITE_OTHER): Payer: Medicare Other | Admitting: Podiatry

## 2015-08-18 DIAGNOSIS — M2040 Other hammer toe(s) (acquired), unspecified foot: Secondary | ICD-10-CM | POA: Diagnosis not present

## 2015-08-18 MED ORDER — CEPHALEXIN 500 MG PO CAPS: 500.0000 mg | ORAL_CAPSULE | Freq: Two times a day (BID) | ORAL | Status: DC

## 2015-08-19 NOTE — Progress Notes (Signed)
Subjective:     Patient ID: Austin [name], male   DOB: February 24, 1941, 75 y.o.   MRN: [id number]  HPI patient states the top of the second toe is bothering me quite a bit and making it hard to wear shoe gear comfortably   Review of Systems     Objective:   Physical Exam  neurovascular status remains intact digital perfusion good with keratotic lesion head of proximal hallux second toe left with elevation of the toe and pain when palpated with crusted tissue formation    Assessment:      hammertoe deformity with possibility for localized infective process    Plan:      debrided lesion with no drainage and his precautionary measure placed on cephalexin 5oor milligrams twice a day and discussed we may need to straighten this toe and if there is continued redness  possibility for infection or osteomyelitis and ultimately he could lose this toe if symptoms were to not get better

## 2015-08-22 ENCOUNTER — Inpatient Hospital Stay (HOSPITAL_COMMUNITY)
Admission: EM | Admit: 2015-08-22 | Discharge: 2015-08-25 | DRG: 308 | Disposition: A | Discharge status: Home / Self Care | Payer: Medicare Other | Attending: Internal Medicine | Admitting: Internal Medicine

## 2015-08-22 ENCOUNTER — Telehealth: Payer: Self-pay | Admitting: Internal Medicine

## 2015-08-22 ENCOUNTER — Emergency Department (HOSPITAL_COMMUNITY): Payer: Medicare Other

## 2015-08-22 ENCOUNTER — Encounter (HOSPITAL_COMMUNITY): Payer: Self-pay | Admitting: Cardiology

## 2015-08-22 DIAGNOSIS — I472 Ventricular tachycardia: Principal | ICD-10-CM | POA: Diagnosis present

## 2015-08-22 DIAGNOSIS — Z95810 Presence of automatic (implantable) cardiac defibrillator: Secondary | ICD-10-CM

## 2015-08-22 DIAGNOSIS — Z8249 Family history of ischemic heart disease and other diseases of the circulatory system: Secondary | ICD-10-CM

## 2015-08-22 DIAGNOSIS — Z952 Presence of prosthetic heart valve: Secondary | ICD-10-CM | POA: Diagnosis not present

## 2015-08-22 DIAGNOSIS — I4729 Other ventricular tachycardia: Secondary | ICD-10-CM

## 2015-08-22 DIAGNOSIS — Z7901 Long term (current) use of anticoagulants: Secondary | ICD-10-CM

## 2015-08-22 DIAGNOSIS — I429 Cardiomyopathy, unspecified: Secondary | ICD-10-CM | POA: Diagnosis present

## 2015-08-22 DIAGNOSIS — I4891 Unspecified atrial fibrillation: Secondary | ICD-10-CM

## 2015-08-22 DIAGNOSIS — I481 Persistent atrial fibrillation: Secondary | ICD-10-CM | POA: Diagnosis present

## 2015-08-22 DIAGNOSIS — N179 Acute kidney failure, unspecified: Secondary | ICD-10-CM | POA: Diagnosis present

## 2015-08-22 DIAGNOSIS — I5023 Acute on chronic systolic (congestive) heart failure: Secondary | ICD-10-CM | POA: Diagnosis present

## 2015-08-22 LAB — CBC
HEMATOCRIT: 48.5 % (ref 39.0–52.0)
Hemoglobin: 15.7 g/dL (ref 13.0–17.0)
MCH: 29.3 pg (ref 26.0–34.0)
MCHC: 32.4 g/dL (ref 30.0–36.0)
MCV: 90.5 fL (ref 78.0–100.0)
PLATELETS: 192 10*3/uL (ref 150–400)
RBC: 5.36 MIL/uL (ref 4.22–5.81)
RDW: 15.9 % — AB (ref 11.5–15.5)
WBC: 11.3 10*3/uL — AB (ref 4.0–10.5)

## 2015-08-22 LAB — BRAIN NATRIURETIC PEPTIDE: B Natriuretic Peptide: 809.3 pg/mL — ABNORMAL HIGH (ref 0.0–100.0)

## 2015-08-22 LAB — BASIC METABOLIC PANEL
Anion gap: 12 (ref 5–15)
BUN: 42 mg/dL — AB (ref 6–20)
CALCIUM: 9.5 mg/dL (ref 8.9–10.3)
CO2: 22 mmol/L (ref 22–32)
CREATININE: 1.87 mg/dL — AB (ref 0.61–1.24)
Chloride: 99 mmol/L — ABNORMAL LOW (ref 101–111)
GFR calc Af Amer: 40 mL/min — ABNORMAL LOW (ref >60)
GFR, EST NON AFRICAN AMERICAN: 34 mL/min — AB (ref >60)
GLUCOSE: 140 mg/dL — AB (ref 65–99)
Potassium: 4.7 mmol/L (ref 3.5–5.1)
SODIUM: 133 mmol/L — AB (ref 135–145)

## 2015-08-22 LAB — I-STAT CHEM 8, ED
BUN: 49 mg/dL — AB (ref 6–20)
CALCIUM ION: 1.18 mmol/L (ref 1.13–1.30)
CHLORIDE: 100 mmol/L — AB (ref 101–111)
CREATININE: 1.7 mg/dL — AB (ref 0.61–1.24)
Glucose, Bld: 128 mg/dL — ABNORMAL HIGH (ref 65–99)
HCT: 55 % — ABNORMAL HIGH (ref 39.0–52.0)
Hemoglobin: 18.7 g/dL — ABNORMAL HIGH (ref 13.0–17.0)
Potassium: 4.5 mmol/L (ref 3.5–5.1)
SODIUM: 134 mmol/L — AB (ref 135–145)
TCO2: 23 mmol/L (ref 0–100)

## 2015-08-22 LAB — TROPONIN I
Troponin I: 0.06 ng/mL — ABNORMAL HIGH (ref <0.031)
Troponin I: 0.06 ng/mL — ABNORMAL HIGH (ref <0.031)

## 2015-08-22 LAB — I-STAT TROPONIN, ED: TROPONIN I, POC: 0.06 ng/mL (ref 0.00–0.08)

## 2015-08-22 LAB — MRSA PCR SCREENING: MRSA by PCR: NEGATIVE

## 2015-08-22 LAB — PROTIME-INR
INR: 2.65 — ABNORMAL HIGH (ref 0.00–1.49)
Prothrombin Time: 27.9 seconds — ABNORMAL HIGH (ref 11.6–15.2)

## 2015-08-22 LAB — MAGNESIUM: Magnesium: 2.3 mg/dL (ref 1.7–2.4)

## 2015-08-22 MED ORDER — CEPHALEXIN 500 MG PO CAPS: 500.0000 mg | ORAL_CAPSULE | Freq: Two times a day (BID) | ORAL | Status: DC

## 2015-08-22 MED ORDER — ACETAMINOPHEN 325 MG PO TABS: 650.0000 mg | ORAL_TABLET | ORAL | Status: DC | PRN

## 2015-08-22 MED ORDER — LISINOPRIL 10 MG PO TABS: 10.0000 mg | ORAL_TABLET | Freq: Every day | ORAL | Status: DC

## 2015-08-22 MED ORDER — SODIUM CHLORIDE 0.9 % IJ SOLN: 3.0000 mL | INTRAMUSCULAR | Status: DC | PRN

## 2015-08-22 MED ORDER — ONDANSETRON HCL 4 MG/2ML IJ SOLN: 4.0000 mg | Freq: Four times a day (QID) | INTRAMUSCULAR | Status: DC | PRN

## 2015-08-22 MED ORDER — SODIUM CHLORIDE 0.9% FLUSH
3.0000 mL | Freq: Two times a day (BID) | INTRAVENOUS | Status: DC
  Administered 2015-08-22 – 2015-08-25 (×4): 3 mL via INTRAVENOUS

## 2015-08-22 MED ORDER — FOLIC ACID 1 MG PO TABS
1.0000 mg | ORAL_TABLET | Freq: Every day | ORAL | Status: DC
  Administered 2015-08-23 – 2015-08-25 (×3): 1 mg via ORAL
  Filled 2015-08-22 (×3): qty 1

## 2015-08-22 MED ORDER — MIDAZOLAM HCL 2 MG/2ML IJ SOLN
INTRAMUSCULAR | Status: AC | PRN
  Administered 2015-08-22: 1 mg via INTRAVENOUS

## 2015-08-22 MED ORDER — LEVOTHYROXINE SODIUM 75 MCG PO TABS
75.0000 ug | ORAL_TABLET | Freq: Every day | ORAL | Status: DC
  Administered 2015-08-23 – 2015-08-25 (×3): 75 ug via ORAL
  Filled 2015-08-22 (×3): qty 1

## 2015-08-22 MED ORDER — FENTANYL CITRATE (PF) 100 MCG/2ML IJ SOLN
INTRAMUSCULAR | Status: AC | PRN
  Administered 2015-08-22: 50 ug via INTRAVENOUS

## 2015-08-22 MED ORDER — CEPHALEXIN 500 MG PO CAPS
500.0000 mg | ORAL_CAPSULE | Freq: Two times a day (BID) | ORAL | Status: DC
  Administered 2015-08-22 – 2015-08-25 (×6): 500 mg via ORAL
  Filled 2015-08-22 (×6): qty 1

## 2015-08-22 MED ORDER — AMIODARONE HCL 150 MG/3ML IV SOLN
300.0000 mg | Freq: Once | INTRAVENOUS | Status: AC
  Administered 2015-08-22: 300 mg via INTRAVENOUS
  Filled 2015-08-22: qty 6

## 2015-08-22 MED ORDER — CETYLPYRIDINIUM CHLORIDE 0.05 % MT LIQD
7.0000 mL | Freq: Two times a day (BID) | OROMUCOSAL | Status: DC
  Administered 2015-08-23: 7 mL via OROMUCOSAL

## 2015-08-22 MED ORDER — FENTANYL CITRATE (PF) 100 MCG/2ML IJ SOLN
INTRAMUSCULAR | Status: AC
  Filled 2015-08-22: qty 4

## 2015-08-22 MED ORDER — AMIODARONE HCL IN DEXTROSE 360-4.14 MG/200ML-% IV SOLN
60.0000 mg/h | INTRAVENOUS | Status: AC
  Administered 2015-08-22: 60 mg/h via INTRAVENOUS

## 2015-08-22 MED ORDER — WARFARIN SODIUM 7.5 MG PO TABS
7.5000 mg | ORAL_TABLET | Freq: Once | ORAL | Status: AC
  Administered 2015-08-22: 7.5 mg via ORAL
  Filled 2015-08-22: qty 1

## 2015-08-22 MED ORDER — AMIODARONE HCL IN DEXTROSE 360-4.14 MG/200ML-% IV SOLN
60.0000 mg/h | INTRAVENOUS | Status: DC
  Administered 2015-08-22: 60 mg/h via INTRAVENOUS

## 2015-08-22 MED ORDER — SODIUM CHLORIDE 0.9% FLUSH: 3.0000 mL | INTRAVENOUS | Status: DC | PRN

## 2015-08-22 MED ORDER — SODIUM CHLORIDE 0.9 % IJ SOLN: 3.0000 mL | Freq: Two times a day (BID) | INTRAMUSCULAR | Status: DC

## 2015-08-22 MED ORDER — MIDAZOLAM HCL 2 MG/2ML IJ SOLN
INTRAMUSCULAR | Status: AC
  Filled 2015-08-22: qty 4

## 2015-08-22 MED ORDER — SODIUM CHLORIDE 0.9 % IV SOLN: 250.0000 mL | INTRAVENOUS | Status: DC | PRN

## 2015-08-22 MED ORDER — AMIODARONE HCL IN DEXTROSE 360-4.14 MG/200ML-% IV SOLN
30.0000 mg/h | INTRAVENOUS | Status: DC
  Filled 2015-08-22: qty 200

## 2015-08-22 MED ORDER — WARFARIN - PHARMACIST DOSING INPATIENT
Freq: Every day | Status: DC
  Administered 2015-08-24: 17:00:00

## 2015-08-22 MED ORDER — SODIUM CHLORIDE 0.9 % IV SOLN: 250.0000 mL | INTRAVENOUS | Status: DC

## 2015-08-22 MED ORDER — AMIODARONE HCL IN DEXTROSE 360-4.14 MG/200ML-% IV SOLN
90.0000 mg/h | Freq: Once | INTRAVENOUS | Status: AC
  Administered 2015-08-22: 90 mg/h via INTRAVENOUS
  Filled 2015-08-22: qty 200

## 2015-08-22 MED ORDER — AMIODARONE HCL IN DEXTROSE 360-4.14 MG/200ML-% IV SOLN
30.0000 mg/h | INTRAVENOUS | Status: DC
  Administered 2015-08-22 – 2015-08-23 (×2): 30 mg/h via INTRAVENOUS
  Filled 2015-08-22 (×2): qty 200

## 2015-08-22 NOTE — Telephone Encounter (Signed)
New message      Pt c/o Shortness Of Breath: STAT if SOB developed within the last 24 hours or pt is noticeably SOB on the phone  1. Are you currently SOB (can you hear that pt is SOB on the phone)? Talking to wife 2. How long have you been experiencing SOB? Since late yesterday 3. Are you SOB when sitting or when up moving around? Sitting down  4. Are you currently experiencing any other symptoms?  Feels weak, bp is 90/60 and HR is 147 Wife thinks he may be in afib.  Please call

## 2015-08-22 NOTE — ED Provider Notes (Signed)
CSN: [id number]     Arrival date & time 08/22/15  1246 History   First MD Initiated Contact with Patient 08/22/15 1306     Chief Complaint  Patient presents with  . Chest Pain  . Atrial Fibrillation     (Consider location/radiation/quality/duration/timing/severity/associated sxs/prior Treatment) Patient is a 75 y.o. male presenting with chest pain and atrial fibrillation.  Chest Pain Pain location:  Substernal area Pain quality: tightness   Pain radiates to:  Does not radiate Pain radiates to the back: no   Pain severity:  Mild Timing:  Constant Progression:  Unchanged Chronicity:  Recurrent Worsened by:  Nothing tried Ineffective treatments:  None tried Associated symptoms: no abdominal pain, no cough, no fever, no headache, no nausea, no shortness of breath and not vomiting   Atrial Fibrillation Associated symptoms include chest pain. Pertinent negatives include no abdominal pain, chills, coughing, fever, headaches, nausea, rash or vomiting.    38 y M w PMH afib, vtach, s/p PPM and AICD who comes in with lightheadedness and chest tightness that started yesterday afternoon after he was playing golf.   Past Medical History  Diagnosis Date  . Nonischemic cardiomyopathy ([place])   . 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 Psychosexual dysfunction with inhibited sexual excitement   . Conductive hearing loss   . Syncope and collapse   . Atrial fibrillation ([place])   . Intraspinal abscess   . Mitral valve insufficiency and aortic valve insufficiency     s/p MVR mechanical  . S/P mitral valve replacement   . Unspecified sleep apnea     last sleep study 11/07  . Ventricular tachycardia [facility])    Past Surgical History  Procedure Laterality Date  . Mitral valve replacement      w #33 [facility]  . Valve replacement  2000  . Evacution of epidural lumbar epidural abscess  1999  . Thyroidectomy    .  Tonsillectomy    . Dental implants    . Cardiac catheterization  04/24/2002  . Insert / replace / remove pacemaker  11/2008  . Cardioversion  03/09/2012    Procedure: CARDIOVERSION;  Surgeon: Evans Lance, MD;  Location: [place];  Service: Cardiovascular;  Laterality: N/A;  . Cardioversion N/A 11/22/2013    Procedure: CARDIOVERSION;  Surgeon: Sanda Klein, MD;  Location: [place] [facility];  Service: Cardiovascular;  Laterality: N/A;  . Permanent pacemaker insertion N/A 08/05/2011    Procedure: PERMANENT PACEMAKER INSERTION;  Surgeon: Evans Lance, MD;  Location: [facility] CATH LAB;  Service: Cardiovascular;  Laterality: N/A;   Family History  Problem Relation Age of Onset  . Heart disease Mother   . Heart failure Mother   . Heart disease Father   . Heart failure Father    Social History  Substance Use Topics  . Smoking status: Never Smoker   . Smokeless tobacco: None  . Alcohol Use: Yes     Comment: occasionally    Review of Systems  Constitutional: Negative for fever and chills.  Eyes: Negative for redness.  Respiratory: Positive for chest tightness. Negative for cough and shortness of breath.   Cardiovascular: Positive for chest pain.  Gastrointestinal: Negative for nausea, vomiting, abdominal pain and diarrhea.  Genitourinary: Negative for dysuria.  Skin: Negative for rash.  Neurological: Positive for light-headedness. Negative for headaches.  All other systems reviewed and are negative.     Allergies  Review of patient's allergies indicates  no known allergies.  Home Medications   Prior to Admission medications   Medication Sig Start Date End Date Taking? Authorizing Provider  amiodarone (PACERONE) 200 MG tablet Take 1 1/2 tablets by mouth Mon-Thurs and 1 tablet by mouth Fri-Sun 06/13/15  Yes Evans Lance, MD  cephALEXin (KEFLEX) 500 MG capsule Take 1 capsule (500 mg total) by mouth 2 (two) times daily. Patient taking differently: Take 500 mg by mouth 2 (two) times  daily. For 14 days. 08/18/15  Yes Wallene Huh, DPM  folic acid (FOLVITE) 1 MG tablet Take 1 mg by mouth daily.    Yes Historical Provider, MD  levothyroxine (SYNTHROID, LEVOTHROID) 75 MCG tablet Take 1 tablet (75 mcg total) by mouth daily. 11/18/14  Yes Evans Lance, MD  lisinopril (PRINIVIL,ZESTRIL) 10 MG tablet Take 10 mg by mouth daily. 05/17/15  Yes Historical Provider, MD  Multiple Vitamins-Minerals (MULTIVITAL) tablet Take 1 tablet by mouth daily.    Yes Historical Provider, MD  Omega-3 Fatty Acids (FISH OIL) 1000 MG CAPS Take 1 capsule by mouth daily.    Yes Historical Provider, MD  warfarin (COUMADIN) 5 MG tablet Take 2.5-5 mg by mouth daily. 2.5 mg on Tuesday, Thursday. All other days are 5 mg.   Yes Historical Provider, MD   BP 118/88 mmHg  Pulse 56  Temp(Src) 97.4 F (36.3 C) (Oral)  Resp 20  Wt 93.895 kg  SpO2 99% Physical Exam  Constitutional: He is oriented to person, place, and time. No distress.  HENT:  Head: Normocephalic and atraumatic.  Eyes: EOM are normal. Pupils are equal, round, and reactive to light.  Neck: Normal range of motion. Neck supple.  Cardiovascular:  Wide complex tachycardia around 140  Pulmonary/Chest: Effort normal. No respiratory distress.  Abdominal: Soft. There is no tenderness.  Musculoskeletal: Normal range of motion.  Neurological: He is alert and oriented to person, place, and time.  Skin: No rash noted. He is not diaphoretic.  Psychiatric: He has a normal mood and affect.    ED Course  Procedures (including critical care time) Labs Review Labs Reviewed  BASIC METABOLIC PANEL - Abnormal; Notable for the following:    Sodium 133 (*)    Chloride 99 (*)    Glucose, Bld 140 (*)    BUN 42 (*)    Creatinine, Ser 1.87 (*)    GFR calc non Af Amer 34 (*)    GFR calc Af Amer 40 (*)    All other components within normal limits  CBC - Abnormal; Notable for the following:    WBC 11.3 (*)    RDW 15.9 (*)    All other components within  normal limits  BRAIN NATRIURETIC PEPTIDE - Abnormal; Notable for the following:    B Natriuretic Peptide 809.3 (*)    All other components within normal limits  PROTIME-INR - Abnormal; Notable for the following:    Prothrombin Time 27.9 (*)    INR 2.65 (*)    All other components within normal limits  I-STAT CHEM 8, ED - Abnormal; Notable for the following:    Sodium 134 (*)    Chloride 100 (*)    BUN 49 (*)    Creatinine, Ser 1.70 (*)    Glucose, Bld 128 (*)    Hemoglobin 18.7 (*)    HCT 55.0 (*)    All other components within normal limits  MRSA PCR SCREENING  MAGNESIUM  PROTIME-INR  TSH  T4, FREE  TROPONIN I  TROPONIN I  TROPONIN I  BASIC METABOLIC PANEL  LIPID PANEL  CBC  HEPATIC FUNCTION PANEL  I-STAT TROPOININ, ED  I-STAT TROPOININ, ED  Randolm Idol, ED    Imaging Review Dg Chest Portable 1 View  08/22/2015  CLINICAL DATA:  Hypotension and dizziness today.  Initial encounter. EXAM: PORTABLE CHEST 1 VIEW COMPARISON:  PA and lateral chest 06/07/2014. FINDINGS: Defibrillator pad is in place. Pacing device is noted. The patient is status post median sternotomy and cardiac valve repair. There is cardiomegaly without edema. Lungs are clear. No pneumothorax or pleural effusion. Degenerative disease about the shoulders noted. IMPRESSION: Cardiomegaly without acute disease. Electronically Signed   By: Inge Rise M.D.   On: 08/22/2015 13:36   I have personally reviewed and evaluated these images and lab results as part of my medical decision-making.   EKG Interpretation   Date/Time:  Friday August 22 2015 12:51:15 EST Ventricular Rate:  149 PR Interval:    QRS Duration: 162 QT Interval:  364 QTC Calculation: 573 R Axis:   53 Text Interpretation:  Wide QRS tachycardia with Fusion complexes  Non-specific intra-ventricular conduction block Possible Inferior infarct  , age undetermined Marked T wave abnormality, consider anterolateral  ischemia Abnormal ECG new  wide comlex tachycardia/IVCD Confirmed by  Johnney Killian, MD, Jeannie Done [phone]) on 08/22/2015 1:02:55 PM      MDM   Final diagnoses:  1.  Ventricular tachycardia. 2.  hypotension    71 y M w PMH afib, vtach, s/p PPM and AICD who comes in with lightheadedness and chest tightness that started yesterday afternoon after he was playing golf.   On ekg is his in vtach with a rate in the 140's.  He has a marginal blood pressure in the 123XX123 systolic but with maps in the 60's and he continues to [facility] well.  Have begun fluid resuscitation with 3L NS.  Patient is well known to cardiology.  Given this, cards was called shortly after arrival and came to the bedside.  Cards attempted to overdrive pace him but were unsuccessful.  Blood pressure continued to be low and mentation seemed to decline so the decision was made to cardiovert. Patient was given 1mg  versed and 50 mcg fentanyl and then cardioverted with 200J to sinus brady.  Blood pressure almost immediately improved.  Cbc/bmp/trop obtained and all unremarkable.  cxr unemarkable. Cards will admit to ICU.  amio gtt started in the ED     Jarome Matin, MD 08/22/15 1846  Merrily Pew, MD 08/23/15 [phone]

## 2015-08-22 NOTE — ED Notes (Signed)
Decision made to emergently cardiovert patient

## 2015-08-22 NOTE — Progress Notes (Signed)
ANTICOAGULATION CONSULT NOTE - Initial Consult  Pharmacy Consult for warfarin Indication: mitral MVR  No Known Allergies  Patient Measurements: Weight: 207 lb (93.895 kg)  Ideal: 81kg  Vital Signs: Temp: 97.4 F (36.3 C) (01/27 1446) Temp Source: Oral (01/27 1446) BP: 109/78 mmHg (01/27 1530) Pulse Rate: 58 (01/27 1530)  Labs:  Recent Labs  08/22/15 1303 08/22/15 1422  HGB 15.7 18.7*  HCT 48.5 55.0*  PLT 192  --   LABPROT 27.9*  --   INR 2.65*  --   CREATININE 1.87* 1.70*    Estimated Creatinine Clearance: 45.1 mL/min (by C-G formula based on Cr of 1.7).   Medical History: Past Medical History  Diagnosis Date  . Nonischemic cardiomyopathy ([place])   . 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 Psychosexual dysfunction with inhibited sexual excitement   . Conductive hearing loss   . Syncope and collapse   . Atrial fibrillation ([place])   . Intraspinal abscess   . Mitral valve insufficiency and aortic valve insufficiency     s/p MVR mechanical  . S/P mitral valve replacement   . Unspecified sleep apnea     last sleep study 11/07  . Ventricular tachycardia ([facility])     Medications:   (Not in a hospital admission) Scheduled:  . fentaNYL      . midazolam       Infusions:  . amiodarone 60 mg/hr (08/22/15 1533)  . amiodarone      Assessment: 75yo M w/ hx of non ishemic cardiomyopathy s/p CRTD implant (infection w/ explant 05/2011 and reimplant on R side 07/2011), ventricular tachycardia s/p appropriate ICD tx, persistent Afib, mitral regurgitation s/p mechanical MVR (on warfarin), has been in Afib since June of last year. Pharmacy consulted to dose warfarin for mitral mechanical heart valve. BUN 49, Trops 0.06, sCr 1.7, CrCl 45.1, Hgb 18.7, Plts 192, no s/sx of bleed. INR 2.65 (therapeutic, but low for Afib + Mitral MVR, want closer to 3.5)  PTA warfarin: Take 2.5mg  on TT, take 5mg  on  all other days. (Last dose 1/26 at 7PM)  Goal of Therapy:  2.5-3.5 Monitor platelets by anticoagulation protocol: Yes   Plan:  Give warfarin 7.5mg  x1 Daily INR/CBC   Georgiann Mohs, PharmD Student  08/22/2015,3:56 PM

## 2015-08-22 NOTE — H&P (Signed)
CARDIOLOGY HISTORY AND PHYSICAL     Patient ID: Austin [name] MRN: [id number], DOB/AGE: Dec 22, 1940 75 y.o.  Admit date: 08/22/2015 Date of Consult: 08/22/2015  Primary Physician: Mathews Argyle, MD Primary Cardiologist: Tamala Julian Electrophysiologist: Lovena Le   CC: tachycardia  HPI:  Austin [name] is a 75 y.o. male with a past medical history significant for non ischemic cardiomyopathy s/p CRTD implant (infection with explant 05/2011 and reimplant on right side 07/2011), ventricular tachycardia s/p appropriate ICD therapy, persistent atrial fibrillation, mitral regurgitation s/p mechanical MVR (on Warfarin).  He was last seen by Dr Lovena Le 05/2015 at which time he had been in atrial fibrillation since June of last year.  He did not have clear symptoms atttributable to his atrial fibrillation and the decision was made for rate control at that time.  He has done well since then with "good days and bad days" but he remains very active playing tennis and golf several days a week. He played golf yesterday and developed fatigue and shortness of breath about an hour after returning home.  His blood pressure monitor at home showed that his heart rate was 140.  He had some sharp left sided chest pain that was worse with taking a deep breath.  This morning, he was still short of breath and called the office. He was advised to go to the ER for further evaluation.  On arrival, he was found to be in ventricular tachycardia at a rate of 146bpm as well as atrial fibrillation.  SBP was in the 70's.  He was unable to be pace terminated out of his ventricular tachycardia and was externally cardioverted 2/2 hemodynamically unstable ventricular tachycardia.   Post cardioversion, he reports that his symptoms are improved. He currently denies chest pain and shortness of breath.   Last echo 06/2011 demonstrated EF 45%, mild LVH, LA moderately dilated, mechanical mitral valve prosthesis.   Past Medical History    Diagnosis Date  . Nonischemic cardiomyopathy ([place])   . 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 Psychosexual dysfunction with inhibited sexual excitement   . Conductive hearing loss   . Syncope and collapse   . Atrial fibrillation ([place])   . Intraspinal abscess   . Mitral valve insufficiency and aortic valve insufficiency     s/p MVR mechanical  . S/P mitral valve replacement   . Unspecified sleep apnea     last sleep study 11/07  . Ventricular tachycardia [facility])      Surgical History:  Past Surgical History  Procedure Laterality Date  . Mitral valve replacement      w #33 [facility]  . Valve replacement  2000  . Evacution of epidural lumbar epidural abscess  1999  . Thyroidectomy    . Tonsillectomy    . Dental implants    . Cardiac catheterization  04/24/2002  . Insert / replace / remove pacemaker  11/2008  . Cardioversion  03/09/2012    Procedure: CARDIOVERSION;  Surgeon: Evans Lance, MD;  Location: [place];  Service: Cardiovascular;  Laterality: N/A;  . Cardioversion N/A 11/22/2013    Procedure: CARDIOVERSION;  Surgeon: Sanda Klein, MD;  Location: [place] [facility];  Service: Cardiovascular;  Laterality: N/A;  . Permanent pacemaker insertion N/A 08/05/2011    Procedure: PERMANENT PACEMAKER INSERTION;  Surgeon: Evans Lance, MD;  Location: [facility] CATH LAB;  Service: Cardiovascular;  Laterality: N/A;      (Not in a hospital  admission)  Inpatient Medications:  . fentaNYL      . midazolam        Allergies: No Known Allergies  Social History   Social History  . Marital Status: Married    Spouse Name: N/A  . Number of Children: N/A  . Years of Education: N/A   Occupational History  . Not on file.   Social History Main Topics  . Smoking status: Never Smoker   . Smokeless tobacco: Not on file  . Alcohol Use: Yes     Comment: occasionally  . Drug Use: No  . Sexual Activity: Not on  file   Other Topics Concern  . Not on file   Social History Narrative     Family History  Problem Relation Age of Onset  . Heart disease Mother   . Heart failure Mother   . Heart disease Father   . Heart failure Father      Review of Systems: All other systems reviewed and are otherwise negative except as noted above.  Physical Exam: Filed Vitals:   08/22/15 1335 08/22/15 1341 08/22/15 1345 08/22/15 1350  BP: 89/72 81/56 80/53  103/78  Pulse:      Resp: 18 10 23 19   Weight:      SpO2:        GEN- The patient is elderly and in slight distress, alert and oriented x 3 today.   HEENT: normocephalic, atraumatic; sclera clear, conjunctiva pink; hearing intact; oropharynx clear; neck supple  Lungs- Clear to ausculation bilaterally, normal work of breathing.  No wheezes, rales, rhonchi Heart- Tachycardic regular rate and rhythm  GI- soft, non-tender, non-distended, bowel sounds present  Extremities- no clubbing, cyanosis, or edema; DP/PT/radial pulses 2+ bilaterally, bandaged hammer toe on left foot  MS- no significant deformity or atrophy Skin- warm and dry, no rash or lesion Psych- euthymic mood, full affect Neuro- strength and sensation are intact  Labs:   Lab Results  Component Value Date   WBC 11.3* 08/22/2015   HGB 15.7 08/22/2015   HCT 48.5 08/22/2015   MCV 90.5 08/22/2015   PLT 192 08/22/2015    Recent Labs Lab 08/22/15 1303  NA 133*  K 4.7  CL 99*  CO2 22  BUN 42*  CREATININE 1.87*  CALCIUM 9.5  GLUCOSE 140*      Radiology/Studies: Dg Chest Portable 1 View 08/22/2015  CLINICAL DATA:  Hypotension and dizziness today.  Initial encounter. EXAM: PORTABLE CHEST 1 VIEW COMPARISON:  PA and lateral chest 06/07/2014. FINDINGS: Defibrillator pad is in place. Pacing device is noted. The patient is status post median sternotomy and cardiac valve repair. There is cardiomegaly without edema. Lungs are clear. No pneumothorax or pleural effusion. Degenerative disease  about the shoulders noted. IMPRESSION: Cardiomegaly without acute disease. Electronically Signed   By: Inge Rise M.D.   On: 08/22/2015 13:36    [id number] tachycardia, rate 146  DEVICE HISTORY: CRTP 2009; upgrade to CRTD 2010 after syncope associated with VT; extraction of CRTD 2012 2/2 pocket infection; implantation of MDT CRTD 2013  Assessment/Plan: 1.  Hemodynamically unstable ventricular tachycardia The patient presented with hemodynamically unstable VT at a rate of 146bpm that was below device detection. Per history, he was in VT for about 18 hours. I was unable to pace terminate his VT through his device. As he was decompensating, we performed urgent external defibrillation which restored sinus bradycardia. Device was programmed to DDD 60 with AV delays of 180/139msec to allow for backup brady pacing support.  Will start IV amiodarone for now May require VT ablation (as per Dr Tanna Furry last note) Cycle enzymes  2.  Mitral regurgitation s/p mechanical MVR Coumadin per pharmacy Update echo  3.  Persistent atrial fibrillation Now is SR s/p cardioversion Continue Warfarin for CHADS2VASC of 2  4.  AKI Baseline creatinine 1.5 Likely elevated in the setting of prolonged VT Will check BMET tomorrow  Plan to admit to ICU  Signed, Chanetta Marshall, NP 08/22/2015 2:19 PM  As above, patient seen and examined. Briefly he is a 75 year old male with past medical history of nonischemic cardiomyopathy, ventricular tachycardia, atrial fibrillation, previous mitral valve replacement on chronic Coumadin with ventricular tachycardia. Patient states he developed increasing fatigue, dyspnea on exertion, dizziness at a proximally 5 PM yesterday after playing golf. He checked his heart rate and it was in the 140 range. He had vague chest pain that increased with inspiration. He presented and was felt to be in ventricular tachycardia. His systolic blood pressure was 70. He underwent urgent  cardioversion and now has a systolic blood pressure of 110. He feels much better. Electrocardiogram shows wide complex tachycardia at a rate of 150. Probable ventricular tachycardia. We'll admit and cycle enzymes. He is on amiodarone chronically but we have given  Additional IV amiodarone. Repeat echocardiogram. Would add low-dose beta blocker tomorrow morning if blood pressure allows. Dr. Lovena Le to evaluate tomorrow morning. Kirk Ruths

## 2015-08-22 NOTE — ED Notes (Signed)
Pt reports chest pain that started earlier today, states has a hx of a-fib and has a pacemaker in place. Dr. Lovena Le is his cardiologist. Reports SOB and dizziness.

## 2015-08-22 NOTE — Sedation Documentation (Addendum)
Cardiology, NP cardioverted patient 200J via bedside machine, rhythm now AV paced

## 2015-08-22 NOTE — ED Notes (Signed)
Report attempted 

## 2015-08-22 NOTE — ED Notes (Signed)
CArdiology NP at bedside interrogating pacemaker.

## 2015-08-22 NOTE — Telephone Encounter (Signed)
Returned call to patient's wife.  Yesterday he played golf and came home just " feeling drained".  He laid down for a bit to see if this would help.  Around 6:30pm walked down to get the mail and felt faint.  When he came back in house she took his BP and it was 110/80 with a HR of 147.  He went and laid back down and just felt like he was "breathing fast".  He did not sleep well last night.  This morning he still feels bad and his BP is now 93/67 with a HR of 147.  I have asked her to send in a transmission to see what his rhythm is now.  She will do so and I will call her back after MD looks over.    Transmission reviewed by Dr Caryl Comes and advised patient go to the ER.  Patient's wife informed and she will bring him

## 2015-08-23 ENCOUNTER — Inpatient Hospital Stay (HOSPITAL_COMMUNITY): Payer: Medicare Other

## 2015-08-23 DIAGNOSIS — I472 Ventricular tachycardia: Secondary | ICD-10-CM

## 2015-08-23 DIAGNOSIS — I481 Persistent atrial fibrillation: Secondary | ICD-10-CM

## 2015-08-23 LAB — CBC
HCT: 42.7 % (ref 39.0–52.0)
HEMOGLOBIN: 13.7 g/dL (ref 13.0–17.0)
MCH: 28.8 pg (ref 26.0–34.0)
MCHC: 32.1 g/dL (ref 30.0–36.0)
MCV: 89.9 fL (ref 78.0–100.0)
PLATELETS: 169 10*3/uL (ref 150–400)
RBC: 4.75 MIL/uL (ref 4.22–5.81)
RDW: 16 % — ABNORMAL HIGH (ref 11.5–15.5)
WBC: 8 10*3/uL (ref 4.0–10.5)

## 2015-08-23 LAB — HEPATIC FUNCTION PANEL
ALK PHOS: 61 U/L (ref 38–126)
ALT: 48 U/L (ref 17–63)
AST: 46 U/L — AB (ref 15–41)
Albumin: 3 g/dL — ABNORMAL LOW (ref 3.5–5.0)
BILIRUBIN INDIRECT: 0.5 mg/dL (ref 0.3–0.9)
Bilirubin, Direct: 0.2 mg/dL (ref 0.1–0.5)
TOTAL PROTEIN: 6.7 g/dL (ref 6.5–8.1)
Total Bilirubin: 0.7 mg/dL (ref 0.3–1.2)

## 2015-08-23 LAB — TSH: TSH: 6.967 u[IU]/mL — AB (ref 0.350–4.500)

## 2015-08-23 LAB — BASIC METABOLIC PANEL
ANION GAP: 7 (ref 5–15)
BUN: 25 mg/dL — ABNORMAL HIGH (ref 6–20)
CALCIUM: 8.6 mg/dL — AB (ref 8.9–10.3)
CHLORIDE: 111 mmol/L (ref 101–111)
CO2: 21 mmol/L — AB (ref 22–32)
Creatinine, Ser: 1.27 mg/dL — ABNORMAL HIGH (ref 0.61–1.24)
GFR calc Af Amer: >60 mL/min (ref >60)
GFR calc non Af Amer: 55 mL/min — ABNORMAL LOW (ref >60)
Glucose, Bld: 81 mg/dL (ref 65–99)
Potassium: 4.2 mmol/L (ref 3.5–5.1)
Sodium: 139 mmol/L (ref 135–145)

## 2015-08-23 LAB — LIPID PANEL
CHOL/HDL RATIO: 3.2 ratio
Cholesterol: 158 mg/dL (ref 0–200)
HDL: 50 mg/dL (ref >40)
LDL CALC: 82 mg/dL (ref 0–99)
Triglycerides: 130 mg/dL (ref <150)
VLDL: 26 mg/dL (ref 0–40)

## 2015-08-23 LAB — PROTIME-INR
INR: 3.24 — ABNORMAL HIGH (ref 0.00–1.49)
PROTHROMBIN TIME: 32.5 s — AB (ref 11.6–15.2)

## 2015-08-23 LAB — T4, FREE: FREE T4: 1.37 ng/dL — AB (ref 0.61–1.12)

## 2015-08-23 LAB — GLUCOSE, CAPILLARY: GLUCOSE-CAPILLARY: 68 mg/dL (ref 65–99)

## 2015-08-23 LAB — TROPONIN I: Troponin I: 0.06 ng/mL — ABNORMAL HIGH (ref <0.031)

## 2015-08-23 MED ORDER — AMIODARONE HCL 200 MG PO TABS
400.0000 mg | ORAL_TABLET | Freq: Two times a day (BID) | ORAL | Status: DC
  Administered 2015-08-23 – 2015-08-25 (×4): 400 mg via ORAL
  Filled 2015-08-23 (×4): qty 2

## 2015-08-23 MED ORDER — DIPHENHYDRAMINE HCL 25 MG PO CAPS
25.0000 mg | ORAL_CAPSULE | Freq: Once | ORAL | Status: AC
  Administered 2015-08-23: 25 mg via ORAL
  Filled 2015-08-23: qty 1

## 2015-08-23 MED ORDER — WARFARIN SODIUM 2 MG PO TABS
4.0000 mg | ORAL_TABLET | Freq: Once | ORAL | Status: AC
  Administered 2015-08-23: 4 mg via ORAL
  Filled 2015-08-23: qty 2

## 2015-08-23 NOTE — Discharge Instructions (Addendum)
No driving for 6 months   Information on my medicine - Coumadin   (Warfarin)  This medication education was reviewed with me or my healthcare representative as part of my discharge preparation.  The pharmacist that spoke with me during my hospital stay was:  Linda Hedges, [facility]  Why was Coumadin prescribed for you? Coumadin was prescribed for you because you have a blood clot or a medical condition that can cause an increased risk of forming blood clots. Blood clots can cause serious health problems by blocking the flow of blood to the heart, lung, or brain. Coumadin can prevent harmful blood clots from forming. As a reminder your indication for Coumadin is:   Blood Clot Prevention After Heart Valve Surgery  What test will check on my response to Coumadin? While on Coumadin (warfarin) you will need to have an INR test regularly to ensure that your dose is keeping you in the desired range. The INR (international normalized ratio) number is calculated from the result of the laboratory test called prothrombin time (PT).  If an INR APPOINTMENT HAS NOT ALREADY BEEN MADE FOR YOU please schedule an appointment to have this lab work done by your health care provider within 7 days. Your INR goal is usually a number between:  2 to 3 or your provider may give you a more narrow range like 2-2.5.  Ask your health care provider during an office visit what your goal INR is.  What  do you need to  know  About  COUMADIN? Take Coumadin (warfarin) exactly as prescribed by your healthcare provider about the same time each day.  DO NOT stop taking without talking to the doctor who prescribed the medication.  Stopping without other blood clot prevention medication to take the place of Coumadin may increase your risk of developing a new clot or stroke.  Get refills before you run out.  What do you do if you miss a dose? If you miss a dose, take it as soon as you remember on the same day then continue your regularly  scheduled regimen the next day.  Do not take two doses of Coumadin at the same time.  Important Safety Information A possible side effect of Coumadin (Warfarin) is an increased risk of bleeding. You should call your healthcare provider right away if you experience any of the following: ? Bleeding from an injury or your nose that does not stop. ? Unusual colored urine (red or dark brown) or unusual colored stools (red or black). ? Unusual bruising for unknown reasons. ? A serious fall or if you hit your head (even if there is no bleeding).  Some foods or medicines interact with Coumadin (warfarin) and might alter your response to warfarin. To help avoid this: ? Eat a balanced diet, maintaining a consistent amount of Vitamin K. ? Notify your provider about major diet changes you plan to make. ? Avoid alcohol or limit your intake to 1 drink for women and 2 drinks for men per day. (1 drink is 5 oz. wine, 12 oz. beer, or 1.5 oz. liquor.)  Make sure that ANY health care provider who prescribes medication for you knows that you are taking Coumadin (warfarin).  Also make sure the healthcare provider who is monitoring your Coumadin knows when you have started a new medication including herbals and non-prescription products.  Coumadin (Warfarin)  Major Drug Interactions  Increased Warfarin Effect Decreased Warfarin Effect  Alcohol (large quantities) Antibiotics (esp. Septra/Bactrim, Flagyl, Cipro) Amiodarone (Cordarone)  Aspirin (ASA) Cimetidine (Tagamet) Megestrol (Megace) NSAIDs (ibuprofen, naproxen, etc.) Piroxicam (Feldene) Propafenone (Rythmol SR) Propranolol (Inderal) Isoniazid (INH) Posaconazole (Noxafil) Barbiturates (Phenobarbital) Carbamazepine (Tegretol) Chlordiazepoxide (Librium) Cholestyramine (Questran) Griseofulvin Oral Contraceptives Rifampin Sucralfate (Carafate) Vitamin K   Coumadin (Warfarin) Major Herbal Interactions  Increased Warfarin Effect Decreased Warfarin  Effect  Garlic Ginseng Ginkgo biloba Coenzyme Q10 Green tea St. Johns wort    Coumadin (Warfarin) FOOD Interactions  Eat a consistent number of servings per week of foods HIGH in Vitamin K (1 serving =  cup)  Collards (cooked, or boiled & drained) Kale (cooked, or boiled & drained) Mustard greens (cooked, or boiled & drained) Parsley *serving size only =  cup Spinach (cooked, or boiled & drained) Swiss chard (cooked, or boiled & drained) Turnip greens (cooked, or boiled & drained)  Eat a consistent number of servings per week of foods MEDIUM-HIGH in Vitamin K (1 serving = 1 cup)  Asparagus (cooked, or boiled & drained) Broccoli (cooked, boiled & drained, or raw & chopped) Brussel sprouts (cooked, or boiled & drained) *serving size only =  cup Lettuce, raw (green leaf, endive, romaine) Spinach, raw Turnip greens, raw & chopped   These websites have more information on Coumadin (warfarin):  FailFactory.se; VeganReport.com.au;   ----------------------------------------------------------------------------------------------------------------------------------------------------------------------------------------------------------------------

## 2015-08-23 NOTE — Progress Notes (Signed)
  Echocardiogram 2D Echocardiogram has been performed.  Austin [name] 08/23/2015, 9:49 AM

## 2015-08-23 NOTE — Progress Notes (Signed)
ANTICOAGULATION CONSULT NOTE - Follow-Up Consult  Pharmacy Consult for warfarin Indication: mitral MVR  No Known Allergies  Patient Measurements: Height: 6\' 2"  (188 cm) Weight: 205 lb (92.987 kg) IBW/kg (Calculated) : 82.2  Ideal: 81kg  Vital Signs: Temp: 97.6 F (36.4 C) (01/28 1149) Temp Source: Oral (01/28 1149) BP: 133/99 mmHg (01/28 1149) Pulse Rate: 57 (01/28 1149)  Labs:  Recent Labs  08/22/15 1303 08/22/15 1422 08/22/15 1830 08/22/15 2303 08/23/15 0543  HGB 15.7 18.7*  --   --  13.7  HCT 48.5 55.0*  --   --  42.7  PLT 192  --   --   --  169  LABPROT 27.9*  --   --   --  32.5*  INR 2.65*  --   --   --  3.24*  CREATININE 1.87* 1.70*  --   --  1.27*  TROPONINI  --   --  0.06* 0.06* 0.06*    Estimated Creatinine Clearance: 61.1 mL/min (by C-G formula based on Cr of 1.27).   Medical History: Past Medical History  Diagnosis Date  . Nonischemic cardiomyopathy ([place])   . 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 Psychosexual dysfunction with inhibited sexual excitement   . Conductive hearing loss   . Syncope and collapse   . Atrial fibrillation ([place])   . Intraspinal abscess   . Mitral valve insufficiency and aortic valve insufficiency     s/p MVR mechanical  . S/P mitral valve replacement   . Unspecified sleep apnea     last sleep study 11/07  . Ventricular tachycardia ([facility])     Medications:  Facility-administered medications prior to admission  Medication Dose Route Frequency Provider Last Rate Last Dose  . [DISCONTINUED] 0.9 %  sodium chloride infusion  250 mL Intravenous Continuous Evans Lance, MD      . [DISCONTINUED] sodium chloride 0.9 % injection 3 mL  3 mL Intravenous Q12H Evans Lance, MD      . [DISCONTINUED] sodium chloride 0.9 % injection 3 mL  3 mL Intravenous PRN Evans Lance, MD       Prescriptions prior to admission  Medication Sig Dispense Refill  Last Dose  . amiodarone (PACERONE) 200 MG tablet Take 1 1/2 tablets by mouth Mon-Thurs and 1 tablet by mouth Fri-Sun 120 tablet 3 08/22/2015 at Unknown time  . cephALEXin (KEFLEX) 500 MG capsule Take 1 capsule (500 mg total) by mouth 2 (two) times daily. (Patient taking differently: Take 500 mg by mouth 2 (two) times daily. For 14 days.) 20 capsule 1 08/22/2015 at Unknown time  . folic acid (FOLVITE) 1 MG tablet Take 1 mg by mouth daily.    08/22/2015 at Unknown time  . levothyroxine (SYNTHROID, LEVOTHROID) 75 MCG tablet Take 1 tablet (75 mcg total) by mouth daily. 30 tablet 6 08/22/2015 at Unknown time  . lisinopril (PRINIVIL,ZESTRIL) 10 MG tablet Take 10 mg by mouth daily.  9 08/22/2015 at Unknown time  . Multiple Vitamins-Minerals (MULTIVITAL) tablet Take 1 tablet by mouth daily.    08/22/2015 at Unknown time  . Omega-3 Fatty Acids (FISH OIL) 1000 MG CAPS Take 1 capsule by mouth daily.    08/22/2015 at Unknown time  . warfarin (COUMADIN) 5 MG tablet Take 2.5-5 mg by mouth daily. 2.5 mg on Tu/Th, all other days are 5 mg.   08/21/2015 at 7p   Scheduled:  . antiseptic oral rinse  7 mL Mouth Rinse BID  . cephALEXin  500 mg Oral BID  . folic acid  1 mg Oral Daily  . levothyroxine  75 mcg Oral QAC breakfast  . sodium chloride flush  3 mL Intravenous Q12H  . warfarin  4 mg Oral ONCE-1800  . Warfarin - Pharmacist Dosing Inpatient   Does not apply q1800   Infusions:  . sodium chloride    . amiodarone 30 mg/hr (08/23/15 1002)    Assessment: 75yo M w/ hx of non ishemic cardiomyopathy s/p CRTD implant (infection w/ explant 05/2011 and reimplant on R side 07/2011), ventricular tachycardia s/p appropriate ICD tx, persistent Afib, mitral regurgitation s/p mechanical MVR (on warfarin), has been in Afib since June of last year. Pharmacy consulted to dose warfarin for mitral mechanical heart valve.   INR 3.24, Hgb 13.7, Plt 169, no bleeding reported.  PTA warfarin:  2.5mg  on Tu/Th, 5mg  on all other days. (Last  dose 1/26 at 7PM)  Goal of Therapy:  INR Goal 3-3.5 per anticoag clinic notes Monitor platelets by anticoagulation protocol: Yes   Plan:  - Warfarin 4 mg x1 - Daily INR/CBC - monitor for bleeding - needs education prior to d/c  Governor Specking, PharmD Clinical Pharmacy Resident Pager: [phone]  08/23/2015,1:31 PM

## 2015-08-23 NOTE — Consult Note (Signed)
Reason for Consult:Ventricular tachycardia storm  Referring Physician: Dr. Wandalee [name] is an 74 y.o. male.   HPI: The patient is a 75 yo man with VT, atrial fib, s/p ICD implant with a remote ICD system infection, s/p insertion of a device on the contralateral side. He was in his usual state of health when he present feeling poorly with VT and could not be pace terminated and was defibrillated. He is referred for addition evaluation. He feels better. He denies prior chest pain or sob prior to the VT but had both in VT. On the day of his tachycardia, he had played golf and had no problems walking up inclines. He denies medical non-compliance and has been on chronic amiondarone.   PMH: Past Medical History  Diagnosis Date  . Nonischemic cardiomyopathy ([name])   . 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 Psychosexual dysfunction with inhibited sexual excitement   . Conductive hearing loss   . Syncope and collapse   . Atrial fibrillation ([place])   . Intraspinal abscess   . Mitral valve insufficiency and aortic valve insufficiency     s/p MVR mechanical  . S/P mitral valve replacement   . Unspecified sleep apnea     last sleep study 11/07  . Ventricular tachycardia ([place])     PSHX: Past Surgical History  Procedure Laterality Date  . Mitral valve replacement      w #[address] [facility]  . Valve replacement  2000  . Evacution of epidural lumbar epidural abscess  1999  . Thyroidectomy    . Tonsillectomy    . Dental implants    . Cardiac catheterization  04/24/2002  . Insert / replace / remove pacemaker  11/2008  . Cardioversion  03/09/2012    Procedure: CARDIOVERSION;  Surgeon: Evans Lance, MD;  Location: [facility];  Service: Cardiovascular;  Laterality: N/A;  . Cardioversion N/A 11/22/2013    Procedure: CARDIOVERSION;  Surgeon: Sanda Klein, MD;  Location: [facility];  Service:  Cardiovascular;  Laterality: N/A;  . Permanent pacemaker insertion N/A 08/05/2011    Procedure: PERMANENT PACEMAKER INSERTION;  Surgeon: Evans Lance, MD;  Location: [facility] LAB;  Service: Cardiovascular;  Laterality: N/A;    FAMHX: Family History  Problem Relation Age of Onset  . Heart disease Mother   . Heart failure Mother   . Heart disease Father   . Heart failure Father     Social History:  reports that he has never smoked. He does not have any smokeless tobacco history on file. He reports that he drinks alcohol. He reports that he does not use illicit drugs.  Allergies: No Known Allergies  Medications: I have reviewed the patient's current medications.  Dg Chest Portable 1 View  08/22/2015  CLINICAL DATA:  Hypotension and dizziness today.  Initial encounter. EXAM: PORTABLE CHEST 1 VIEW COMPARISON:  PA and lateral chest 06/07/2014. FINDINGS: Defibrillator pad is in place. Pacing device is noted. The patient is status post median sternotomy and cardiac valve repair. There is cardiomegaly without edema. Lungs are clear. No pneumothorax or pleural effusion. Degenerative disease about the shoulders noted. IMPRESSION: Cardiomegaly without acute disease. Electronically Signed   By: Inge Rise M.D.   On: 08/22/2015 13:36    ROS  As stated in the HPI and negative for all other systems.  Physical Exam  Vitals:Blood pressure 104/66, pulse 61, temperature 96.3 F (35.7 C),  temperature source Axillary, resp. rate 21, height 6\' 2"  (1.88 m), weight 205 lb (92.987 kg), SpO2 92 %.  Well appearing man, NAD HEENT: Unremarkable Neck:  6 cm JVD, no thyromegally Lymphatics:  No adenopathy Back:  No CVA tenderness Lungs:  Clear with no wheezes HEART:  Regular rate rhythm, no murmurs, no rubs, no clicks Abd:  soft, positive bowel sounds, no organomegally, no rebound, no guarding Ext:  2 plus pulses, no edema, no cyanosis, no clubbing Skin:  No rashes no nodules Neuro:  CN II through  XII intact, motor grossly intact  ECG - RBBB VT with a Left inferior axis Tele - NSR with AV pacing  Assessment/Plan: 1. VT storm - he is stable on IV amio. Will switch to po and transfer to step down. Hopefully home on Monday 2. Acute systolic heart failure - with return to NSR, he feels much better 3. Persistent atrial fib - with shock out of VT, he has also reverted to NSR 4. S/p MV replacement - his valve appears stable. He will continue systemic anti-coagulation  Austin [name] 08/23/2015, 11:11 AM

## 2015-08-24 DIAGNOSIS — I5023 Acute on chronic systolic (congestive) heart failure: Secondary | ICD-10-CM

## 2015-08-24 LAB — CBC
HCT: 42.2 % (ref 39.0–52.0)
HEMOGLOBIN: 13.5 g/dL (ref 13.0–17.0)
MCH: 29 pg (ref 26.0–34.0)
MCHC: 32 g/dL (ref 30.0–36.0)
MCV: 90.8 fL (ref 78.0–100.0)
Platelets: 169 10*3/uL (ref 150–400)
RBC: 4.65 MIL/uL (ref 4.22–5.81)
RDW: 16.1 % — ABNORMAL HIGH (ref 11.5–15.5)
WBC: 8 10*3/uL (ref 4.0–10.5)

## 2015-08-24 LAB — PROTIME-INR
INR: 3.49 — ABNORMAL HIGH (ref 0.00–1.49)
PROTHROMBIN TIME: 34.3 s — AB (ref 11.6–15.2)

## 2015-08-24 MED ORDER — POLYETHYLENE GLYCOL 3350 17 G PO PACK
17.0000 g | PACK | Freq: Once | ORAL | Status: AC
  Administered 2015-08-24: 17 g via ORAL
  Filled 2015-08-24: qty 1

## 2015-08-24 MED ORDER — WARFARIN SODIUM 2 MG PO TABS
2.0000 mg | ORAL_TABLET | Freq: Once | ORAL | Status: AC
  Administered 2015-08-24: 2 mg via ORAL
  Filled 2015-08-24: qty 1

## 2015-08-24 MED ORDER — WARFARIN SODIUM 2.5 MG PO TABS: 2.5000 mg | ORAL_TABLET | Freq: Every day | ORAL | Status: DC

## 2015-08-24 NOTE — Progress Notes (Addendum)
ANTICOAGULATION CONSULT NOTE - Follow-Up Consult  Pharmacy Consult for warfarin Indication: mitral MVR  No Known Allergies  Patient Measurements: Height: 6\' 2"  (188 cm) Weight: 202 lb 8 oz (91.853 kg) IBW/kg (Calculated) : 82.2  Ideal: 81kg  Vital Signs: Temp: 97.8 F (36.6 C) (01/29 0400) Temp Source: Oral (01/29 0400) BP: 142/95 mmHg (01/29 0800)  Labs:  Recent Labs  08/22/15 1303 08/22/15 1422 08/22/15 1830 08/22/15 2303 08/23/15 0543 08/24/15 0225  HGB 15.7 18.7*  --   --  13.7 13.5  HCT 48.5 55.0*  --   --  42.7 42.2  PLT 192  --   --   --  169 169  LABPROT 27.9*  --   --   --  32.5* 34.3*  INR 2.65*  --   --   --  3.24* 3.49*  CREATININE 1.87* 1.70*  --   --  1.27*  --   TROPONINI  --   --  0.06* 0.06* 0.06*  --     Estimated Creatinine Clearance: 61.1 mL/min (by C-G formula based on Cr of 1.27).   Medical History: Past Medical History  Diagnosis Date  . Nonischemic cardiomyopathy ([place])   . 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 Psychosexual dysfunction with inhibited sexual excitement   . Conductive hearing loss   . Syncope and collapse   . Atrial fibrillation ([place])   . Intraspinal abscess   . Mitral valve insufficiency and aortic valve insufficiency     s/p MVR mechanical  . S/P mitral valve replacement   . Unspecified sleep apnea     last sleep study 11/07  . Ventricular tachycardia (H[facility])     Medications:  Facility-administered medications prior to admission  Medication Dose Route Frequency Provider Last Rate Last Dose  . [DISCONTINUED] 0.9 %  sodium chloride infusion  250 mL Intravenous Continuous Evans Lance, MD      . [DISCONTINUED] sodium chloride 0.9 % injection 3 mL  3 mL Intravenous Q12H Evans Lance, MD      . [DISCONTINUED] sodium chloride 0.9 % injection 3 mL  3 mL Intravenous PRN Evans Lance, MD       Prescriptions prior to admission   Medication Sig Dispense Refill Last Dose  . amiodarone (PACERONE) 200 MG tablet Take 1 1/2 tablets by mouth Mon-Thurs and 1 tablet by mouth Fri-Sun 120 tablet 3 08/22/2015 at Unknown time  . cephALEXin (KEFLEX) 500 MG capsule Take 1 capsule (500 mg total) by mouth 2 (two) times daily. (Patient taking differently: Take 500 mg by mouth 2 (two) times daily. For 14 days.) 20 capsule 1 08/22/2015 at Unknown time  . folic acid (FOLVITE) 1 MG tablet Take 1 mg by mouth daily.    08/22/2015 at Unknown time  . levothyroxine (SYNTHROID, LEVOTHROID) 75 MCG tablet Take 1 tablet (75 mcg total) by mouth daily. 30 tablet 6 08/22/2015 at Unknown time  . lisinopril (PRINIVIL,ZESTRIL) 10 MG tablet Take 10 mg by mouth daily.  9 08/22/2015 at Unknown time  . Multiple Vitamins-Minerals (MULTIVITAL) tablet Take 1 tablet by mouth daily.    08/22/2015 at Unknown time  . Omega-3 Fatty Acids (FISH OIL) 1000 MG CAPS Take 1 capsule by mouth daily.    08/22/2015 at Unknown time  . warfarin (COUMADIN) 5 MG tablet Take 2.5-5 mg by mouth daily. 2.5 mg on Tu/Th, all other days are 5 mg.   08/21/2015  at 7p   Scheduled:  . amiodarone  400 mg Oral BID  . antiseptic oral rinse  7 mL Mouth Rinse BID  . cephALEXin  500 mg Oral BID  . folic acid  1 mg Oral Daily  . levothyroxine  75 mcg Oral QAC breakfast  . sodium chloride flush  3 mL Intravenous Q12H  . warfarin  2 mg Oral ONCE-1800  . Warfarin - Pharmacist Dosing Inpatient   Does not apply q1800   Infusions:  . sodium chloride    . amiodarone Stopped (08/23/15 2005)    Assessment: 75yo M w/ hx of non ishemic cardiomyopathy s/p CRTD implant (infection w/ explant 05/2011 and reimplant on R side 07/2011), ventricular tachycardia s/p appropriate ICD tx, persistent Afib, mitral regurgitation s/p mechanical MVR (on warfarin), has been in Afib since June of last year. Pharmacy consulted to dose warfarin for mitral mechanical heart valve.   INR 3.49 today. Hgb 13.5, Plt 169, no bleeding  noted  PTA warfarin:  2.5mg  on Tu/Th, 5mg  on all other days. (Last dose 1/26 at 7PM)  Goal of Therapy:  INR Goal 3-3.5 per anticoag clinic notes Monitor platelets by anticoagulation protocol: Yes   Plan:  - decrease to warfarin 2 mg x1 tonight to hopefully keep within range without overshooting - Daily INR/CBC - monitor for bleeding - education completed 1/28  Governor Specking, PharmD Clinical Pharmacy Resident Pager: [phone]  08/24/2015,11:26 AM

## 2015-08-24 NOTE — Progress Notes (Signed)
Patient ID: OMARIO GUMAER, male   DOB: 12/03/1940, 75 y.o.   MRN: [id number]    Patient Name: Austin [name] Date of Encounter: 08/24/2015     Active Problems:   VENTRICULAR TACHYCARDIA   Ventricular tachycardia (HCC)    SUBJECTIVE  No chest pain or sob. No VT  CURRENT MEDS . amiodarone  400 mg Oral BID  . antiseptic oral rinse  7 mL Mouth Rinse BID  . cephALEXin  500 mg Oral BID  . folic acid  1 mg Oral Daily  . levothyroxine  75 mcg Oral QAC breakfast  . sodium chloride flush  3 mL Intravenous Q12H  . Warfarin - Pharmacist Dosing Inpatient   Does not apply q1800    OBJECTIVE  Filed Vitals:   08/24/15 0500 08/24/15 0600 08/24/15 0700 08/24/15 0800  BP: 138/80 139/88 132/80 142/95  Pulse:      Temp:      TempSrc:      Resp:      Height:      Weight: 202 lb 8 oz (91.853 kg)     SpO2:  91% 94%     Intake/Output Summary (Last 24 hours) at 08/24/15 0925 Last data filed at 08/24/15 0800  Gross per 24 hour  Intake 1265.09 ml  Output    725 ml  Net 540.09 ml   Filed Weights   08/22/15 1800 08/23/15 0500 08/24/15 0500  Weight: 211 lb 10.3 oz (96 kg) 205 lb (92.987 kg) 202 lb 8 oz (91.853 kg)    PHYSICAL EXAM  General: Pleasant, NAD. Neuro: Alert and oriented X 3. Moves all extremities spontaneously. Psych: Normal affect. HEENT:  Normal  Neck: Supple without bruits or JVD. Lungs:  Resp regular and unlabored, CTA. Heart: RRR no s3, s4, or murmurs. Mechanical S1 Abdomen: Soft, non-tender, non-distended, BS + x 4.  Extremities: No clubbing, cyanosis or edema. DP/PT/Radials 2+ and equal bilaterally.  Accessory Clinical Findings  CBC  Recent Labs  08/23/15 0543 08/24/15 0225  WBC 8.0 8.0  HGB 13.7 13.5  HCT 42.7 42.2  MCV 89.9 90.8  PLT 169 [id number]   Basic Metabolic Panel  Recent Labs  08/22/15 1303 08/22/15 1422 08/23/15 0543  NA 133* 134* 139  K 4.7 4.5 4.2  CL 99* 100* 111  CO2 22  --  21*  GLUCOSE 140* 128* 81  BUN 42* 49* 25*    CREATININE 1.87* 1.70* 1.27*  CALCIUM 9.5  --  8.6*  MG  --  2.3  --    Liver Function Tests  Recent Labs  08/23/15 0543  AST 46*  ALT 48  ALKPHOS 61  BILITOT 0.7  PROT 6.7  ALBUMIN 3.0*   No results for input(s): LIPASE, AMYLASE in the last 72 hours. Cardiac Enzymes  Recent Labs  08/22/15 1830 08/22/15 2303 08/23/15 0543  TROPONINI 0.06* 0.06* 0.06*   BNP Invalid input(s): POCBNP D-Dimer No results for input(s): DDIMER in the last 72 hours. Hemoglobin A1C No results for input(s): HGBA1C in the last 72 hours. Fasting Lipid Panel  Recent Labs  08/23/15 0543  CHOL 158  HDL 50  LDLCALC 82  TRIG 130  CHOLHDL 3.2   Thyroid Function Tests  Recent Labs  08/23/15 0543  TSH 6.967*    TELE  AV sequential pacing  Radiology/Studies  Dg Chest Portable 1 View  08/22/2015  CLINICAL DATA:  Hypotension and dizziness today.  Initial encounter. EXAM: PORTABLE CHEST 1 VIEW COMPARISON:  PA and lateral chest  06/07/2014. FINDINGS: Defibrillator pad is in place. Pacing device is noted. The patient is status post median sternotomy and cardiac valve repair. There is cardiomegaly without edema. Lungs are clear. No pneumothorax or pleural effusion. Degenerative disease about the shoulders noted. IMPRESSION: Cardiomegaly without acute disease. Electronically Signed   By: Inge Rise M.D.   On: 08/22/2015 13:36    ASSESSMENT AND PLAN  1. VT storm - he has had no additional VT. We have switched him to po amio and we will transfer him to tele. Because of his very high functional level, I do not plan to perform left heart cath. He has no symptoms. 2. Acute on chronic systolic heart failure - this is a consequence of VT and is resolved 3. Atrial fib - he is s/p DCCV of VT which has reverted him to NSR.  4. Mechanical heart valve - he appears stable with good valve function  Kamaiyah Uselton,M.D.  08/24/2015 9:25 AM

## 2015-08-25 ENCOUNTER — Ambulatory Visit: Payer: Medicare Other | Admitting: Podiatry

## 2015-08-25 LAB — CBC
HCT: 41.7 % (ref 39.0–52.0)
Hemoglobin: 13.4 g/dL (ref 13.0–17.0)
MCH: 28.9 pg (ref 26.0–34.0)
MCHC: 32.1 g/dL (ref 30.0–36.0)
MCV: 89.9 fL (ref 78.0–100.0)
Platelets: 167 10*3/uL (ref 150–400)
RBC: 4.64 MIL/uL (ref 4.22–5.81)
RDW: 15.9 % — AB (ref 11.5–15.5)
WBC: 8.6 10*3/uL (ref 4.0–10.5)

## 2015-08-25 LAB — PROTIME-INR
INR: 3.25 — ABNORMAL HIGH (ref 0.00–1.49)
PROTHROMBIN TIME: 32.5 s — AB (ref 11.6–15.2)

## 2015-08-25 MED ORDER — AMIODARONE HCL 200 MG PO TABS: 400.0000 mg | ORAL_TABLET | Freq: Every day | ORAL | Status: DC

## 2015-08-25 NOTE — Progress Notes (Signed)
Patient ID: Austin [name], male   DOB: January 27, 1941, 75 y.o.   MRN: [id number]    Patient Name: Austin [name] Date of Encounter: 08/25/2015     Active Problems:   VENTRICULAR TACHYCARDIA   Ventricular tachycardia ([place])    SUBJECTIVE Feeling well, denies any CP, palpitations or SOB, no dizziness, near syncope or syncope.  He wants to go home.  CURRENT MEDS . amiodarone  400 mg Oral BID  . antiseptic oral rinse  7 mL Mouth Rinse BID  . cephALEXin  500 mg Oral BID  . folic acid  1 mg Oral Daily  . levothyroxine  75 mcg Oral QAC breakfast  . sodium chloride flush  3 mL Intravenous Q12H  . Warfarin - Pharmacist Dosing Inpatient   Does not apply q1800    OBJECTIVE  Filed Vitals:   08/24/15 0800 08/24/15 1100 08/24/15 1238 08/24/15 2049  BP: 142/95 127/90 151/78 138/88  Pulse:   62 65  Temp:  97.1 F (36.2 C) 97.8 F (36.6 C) 98 F (36.7 C)  TempSrc:  Oral Oral Oral  Resp:  20 20 16   Height:  6' 1.5" (1.867 m)    Weight:  200 lb 2.8 oz (90.8 kg)    SpO2:  91% 94% 95%    Intake/Output Summary (Last 24 hours) at 08/25/15 0513 Last data filed at 08/24/15 2200  Gross per 24 hour  Intake    480 ml  Output      0 ml  Net    480 ml   Filed Weights   08/23/15 0500 08/24/15 0500 08/24/15 1100  Weight: 205 lb (92.987 kg) 202 lb 8 oz (91.853 kg) 200 lb 2.8 oz (90.8 kg)    PHYSICAL EXAM General: Pleasant,well nourished, NAD. Neuro: Alert and oriented X 3. Moves all extremities spontaneously. Psych: Normal affect. HEENT:  Normal  Neck: Supple without bruits or JVD. Lungs:  Resp regular and unlabored, CTA. Heart: RRR no s3, s4, or murmurs. Mechanical S1 Abdomen: Soft, non-tender, non-distended Extremities: No clubbing, cyanosis or edema.     CBC  Recent Labs  08/23/15 0543 08/24/15 0225  WBC 8.0 8.0  HGB 13.7 13.5  HCT 42.7 42.2  MCV 89.9 90.8  PLT 169 [id number]   Basic Metabolic Panel  Recent Labs  08/22/15 1303 08/22/15 1422 08/23/15 0543  NA 133* 134*  139  K 4.7 4.5 4.2  CL 99* 100* 111  CO2 22  --  21*  GLUCOSE 140* 128* 81  BUN 42* 49* 25*  CREATININE 1.87* 1.70* 1.27*  CALCIUM 9.5  --  8.6*  MG  --  2.3  --    Liver Function Tests  Recent Labs  08/23/15 0543  AST 46*  ALT 48  ALKPHOS 61  BILITOT 0.7  PROT 6.7  ALBUMIN 3.0*   Cardiac Enzymes  Recent Labs  08/22/15 1830 08/22/15 2303 08/23/15 0543  TROPONINI 0.06* 0.06* 0.06*   Fasting Lipid Panel  Recent Labs  08/23/15 0543  CHOL 158  HDL 50  LDLCALC 82  TRIG 130  CHOLHDL 3.2   Thyroid Function Tests  Recent Labs  08/23/15 0543  TSH 6.967*    TELE SR, V pacing, no VT  08/23/15: Echocardiogram Study Conclusions - Left ventricle: The cavity size was moderately dilated. Wall thickness was normal. Systolic function was moderately to severely reduced. The estimated ejection fraction was in the range of 30% to 35%. Diffuse hypokinesis. The study is not technically sufficient to allow evaluation of  LV diastolic function. - Aortic valve: Trileaflet. Sclerosis without stenosis. There was no regurgitation. - Mitral valve: Tilting bileaflet mechanical valve without obstruction. Normal bileaflet motion. Trivial regurgitation. Mean gradient (D): 4 mm Hg. Valve area by pressure half-time: 1.85 cm^2. Valve area by continuity equation (using LVOT flow): 1.54 cm^2. - Left atrium: Severely dilated at 58 ml/m2. - Right ventricle: The cavity size was moderately dilated. Systolic function is reduced. Lateral annulus peak S velocity: 8.5 cm/s. - Right atrium: Severely dilated at 29 cm2. - Tricuspid valve: There was mild regurgitation. - Pulmonary arteries: PA peak pressure: 32 mm Hg (S) + RAP. - Systemic veins: IVC not visualized.  Impressions: - Compared to a prior study in 2012, the EF is lower at 30-35% - moderate dilation with normal wall thickness and global hypokinesis. The RV is also dilated with reduced  systolic function. The mechanical mitral valve is functioning normally without obstruction. There is severe biatrial enlargement.    DEVICE HISTORY: CRTP 2009; upgrade to CRTD 2010 after syncope associated with VT; extraction of CRTD 2012 2/2 pocket infection; implantation of MDT CRTD 2013   ASSESSMENT AND PLAN  1. VT storm       he has had no additional VT.       We have switched him to po amio and        Because of his very high functional level, no plans for left heart cath. He has no symptoms.      Trop 0.06 x3      NO DRIVING x 6 months, he has been instructed of the [place] law, states understanding  2. Acute on chronic systolic heart failure      NICM,  MDT CRT-D     this is a consequence of VT and is resolved     His exam is euvolemic  3. Atrial fib      he is s/p DCCV of VT which has reverted him to NSR. Remains in [place] is at least 2  4. Mechanical heart valve     appears stable with good valve function     Coumadin, INR today is 3.25, H/H stable     Manages with coumadin clinic  5. AKI     improved   Tommye Standard, PA-C  08/25/2015 5:13 AM   EP Attending  Patient seen and examined. I concur with the findings as noted above. With RRR He appears to be stable for discharge. Continue his current meds but will want amio 400 daily. I would like to see him back in 2 weeks in office to discuss VT ablation. He will need early followup for PT/INR with uptitration of his amio which may affect INR.   Mikle Bosworth.D.

## 2015-08-25 NOTE — Care Management Important Message (Signed)
Important Message  Patient Details  Name: Austin [name] MRN: [id number] Date of Birth: 06/27/1941   Medicare Important Message Given:  Yes    Barb Merino Elinda Bunten 08/25/2015, 2:49 PM

## 2015-08-25 NOTE — Discharge Summary (Signed)
ELECTROPHYSIOLOGY PROCEDURE DISCHARGE SUMMARY    Patient ID: Austin [name],  MRN: [id number], DOB/AGE: 73-14-1942 75 y.o.  Admit date: 08/22/2015 Discharge date: 08/25/2015  Primary Care Physician: Mathews Argyle, MD Primary Cardiologist: Dr. Tamala Julian Electrophysiologist: Dr. Lovena Le  Primary Discharge Diagnosis:  1. Ventricular tachycardia  Secondary Discharge Diagnosis:  1. VHD with mechanical MVR     On coumadin, managed with the coumadin clinic, does home checks, next is planned/scheduled for Wed 08/27/15 2. Persistent  Afib     CHADS2Vasc is at least  3. NICM     Compensated 4. AKI     resolved  No Known Allergies    Brief HPI: Austin [name] is a 75 y.o. male was admitted to [facility] with c/o tachycardia, increasing SOB, and atypical CP, noted to be in VT with PAFib, cardioverted in the ER after failed attempts to terminate his VT with ATP.  Hospital Course:  The patient was admitted 08/22/15 as noted in HPI, by his reported history felt likely to have been in VT for as long as 18 hours and began to decompensate secondary to this, a cardiac cath was not pursued given no anginal symptoms his Trop remained flat at 0.06 x3.  He was treated as well with amiodarone gtt, transitioned to PO and transferred to telemetry.  He was observed on the monitor without additional VT in the last 48 hours.  He had an echocardiogram noting his LVEF 30-35%, normally functioning mechanical MVR,  Global hypokinesis, and severe bi-atrial enlargement. The patient feels very well this morning, denies any kind of CP, palpitations or SOB.  He has been ambulatory in his room without difficulty.  He will have early f/u with Dr. Lovena Le.  His amiodarone has been up-titrated, and is planned/scheduled for an INR check in 2 days, this is managed with the coumadin clinic.  As recommended by the pharmacist, he will remain on his home dose warfarin, and adjust as per the coumadin clinic.  The patient has been  advised of [place] law, no driving for 6 months post VT episode.  The patient was examined by Dr. Lovena Le and considered stable for discharge to home.   The patient's discharge medications include an ACE-I (lisinopril) and no beta blocker secondary to significant fatigue  Physical Exam: Filed Vitals:   08/24/15 2049 08/25/15 0527 08/25/15 0837 08/25/15 1153  BP: 138/88 133/79 125/65 118/71  Pulse: 65 66 63 60  Temp: 98 F (36.7 C) 98.2 F (36.8 C) 97.5 F (36.4 C) 97 F (36.1 C)  TempSrc: Oral Oral Oral Oral  Resp: 16 21  18   Height:      Weight:  200 lb 12.8 oz (91.082 kg)    SpO2: 95% 95% 91% 95%    Labs:   Lab Results  Component Value Date   WBC 8.6 08/25/2015   HGB 13.4 08/25/2015   HCT 41.7 08/25/2015   MCV 89.9 08/25/2015   PLT 167 08/25/2015     Recent Labs Lab 08/23/15 0543  NA 139  K 4.2  CL 111  CO2 21*  BUN 25*  CREATININE 1.27*  CALCIUM 8.6*  PROT 6.7  BILITOT 0.7  ALKPHOS 61  ALT 48  AST 46*  GLUCOSE 81    Discharge Medications:    Medication List    STOP taking these medications        cephALEXin 500 MG capsule  Commonly known as:  KEFLEX      TAKE these medications  amiodarone 200 MG tablet  Commonly known as:  PACERONE  Take 2 tablets (400 mg total) by mouth daily.     Fish Oil 1000 MG Caps  Take 1 capsule by mouth daily.     folic acid 1 MG tablet  Commonly known as:  FOLVITE  Take 1 mg by mouth daily.     levothyroxine 75 MCG tablet  Commonly known as:  SYNTHROID, LEVOTHROID  Take 1 tablet (75 mcg total) by mouth daily.     lisinopril 10 MG tablet  Commonly known as:  PRINIVIL,ZESTRIL  Take 10 mg by mouth daily.     MULTIVITAL tablet  Take 1 tablet by mouth daily.     warfarin 5 MG tablet  Commonly known as:  COUMADIN  Take 2.5-5 mg by mouth daily. 2.5 mg on Tu/Th, all other days are 5 mg.        Disposition: Home  Follow-up Information    Follow up with [name], MD On 09/18/2015.   Specialty:   Cardiology   Why:  10:00AM   Contact information:   [address] [address] [phone]       Duration of Discharge Encounter: Greater than 30 minutes including physician time.  SignedTommye Standard, PA-C 08/25/2015 1:28 PM  EP Attending Patient seen and examined. He is doing well and had no recurrent VT. He will be discharged home on higher dose of amio. Will see him back in the next week or two to discuss VT ablation.  Mikle Bosworth.D.

## 2015-08-25 NOTE — Clinical Documentation Improvement (Addendum)
Cardiology  (Query responses must be documented in the current medical record, not on the CDI BPA form.)  Please document any baseline, historical, and/or clinical observations and treatment provided this admission to support the diagnosis of "Acute on Chronic Systolic Heart Failure".  If acute on chronic is not the current type of Systolic Heart Failure, please updated accordingly.  Clinical Information/Indicators: "Acute systolic heart failure - with return to NSR, he feels much better" is documented in the consult note by Dr. Lovena Le 08/23/15 at 11:11 am. "Acute on chronic systolic heart failure - this is a consequence of VT and is resolved" is documented in the progress note by Dr. Lovena Le 08/24/15 at 9:25 am. However, the patient was not treated with IV diuretics this admission. Breath sounds were noted to be clear, with normal work of breathing is documented in the H&P BNP this admission 809.3 with no old values for comparison.    Please exercise your independent, professional judgment when responding. A specific answer is not anticipated or expected.   Thank You, Erling Conte  RN BSN CCDS [phone] Health Information Management [place]

## 2015-08-25 NOTE — Progress Notes (Signed)
ANTICOAGULATION CONSULT NOTE - Follow Up Consult  Pharmacy Consult for Coumadin Indication: mechanical mitral valve and atrial fibrillation  No Known Allergies  Patient Measurements: Height: 6' 1.5" (186.7 cm) Weight: 200 lb 12.8 oz (91.082 kg) IBW/kg (Calculated) : 81.05  Vital Signs: Temp: 97.5 F (36.4 C) (01/30 0837) Temp Source: Oral (01/30 0837) BP: 125/65 mmHg (01/30 0837) Pulse Rate: 63 (01/30 0837)  Labs:  Recent Labs  08/22/15 1303 08/22/15 1422 08/22/15 1830 08/22/15 2303 08/23/15 0543 08/24/15 0225 08/25/15 0456  HGB 15.7 18.7*  --   --  13.7 13.5 13.4  HCT 48.5 55.0*  --   --  42.7 42.2 41.7  PLT 192  --   --   --  169 169 167  LABPROT 27.9*  --   --   --  32.5* 34.3* 32.5*  INR 2.65*  --   --   --  3.24* 3.49* 3.25*  CREATININE 1.87* 1.70*  --   --  1.27*  --   --   TROPONINI  --   --  0.06* 0.06* 0.06*  --   --     Estimated Creatinine Clearance: 60.3 mL/min (by C-G formula based on Cr of 1.27).  Assessment:   INR remains at goal (3.25).  Down from upper-range therapeutic after 2 mg dose on 1/29.  INR may trend down again by am.   On higher dose of Amiodarone than prior to admission.  Goal of Therapy:  INR 3-3.5 Monitor platelets by anticoagulation protocol: Yes   Plan:   Expectiing discharge today.  Would resume home Coumadin regimen: 5 mg daily except 2.5 mg on Tuesdays and Thursdays.  Wife reports q14 days PT/INR due on 08/27/15.  Has home monitor.  Arty Baumgartner, [place] Pager: [phone] 08/25/2015,11:51 AM

## 2015-08-25 NOTE — Progress Notes (Signed)
Pt has orders to be discharged. Discharge instructions given and pt has no additional questions at this time. Medication regimen reviewed and pt educated. Pt verbalized understanding and has no additional questions. Telemetry box removed. IV removed and site in good condition. Pt stable and waiting for transportation. 

## 2015-08-27 ENCOUNTER — Ambulatory Visit (INDEPENDENT_AMBULATORY_CARE_PROVIDER_SITE_OTHER): Payer: Medicare Other | Admitting: Pharmacist

## 2015-08-27 DIAGNOSIS — Z5181 Encounter for therapeutic drug level monitoring: Secondary | ICD-10-CM

## 2015-08-27 LAB — POCT INR: INR: 3

## 2015-08-28 ENCOUNTER — Telehealth: Payer: Self-pay | Admitting: Internal Medicine

## 2015-08-28 NOTE — Telephone Encounter (Signed)
Austin [name] is calling because he is doing fine and relaxing at home , but regarding his Febuary 23 appt would like to move it sooner . Please  Call  Austin [name] called back and said not to send message

## 2015-09-01 ENCOUNTER — Ambulatory Visit (INDEPENDENT_AMBULATORY_CARE_PROVIDER_SITE_OTHER): Payer: Medicare Other | Admitting: Internal Medicine

## 2015-09-01 ENCOUNTER — Encounter: Payer: Self-pay | Admitting: Internal Medicine

## 2015-09-01 ENCOUNTER — Encounter: Payer: Self-pay | Admitting: *Deleted

## 2015-09-01 VITALS — BP 102/58 | HR 66 | Ht 73.5 in | Wt 203.0 lb

## 2015-09-01 DIAGNOSIS — Z01812 Encounter for preprocedural laboratory examination: Secondary | ICD-10-CM | POA: Diagnosis not present

## 2015-09-01 DIAGNOSIS — Z95810 Presence of automatic (implantable) cardiac defibrillator: Secondary | ICD-10-CM

## 2015-09-01 DIAGNOSIS — I472 Ventricular tachycardia, unspecified: Secondary | ICD-10-CM

## 2015-09-01 DIAGNOSIS — I5022 Chronic systolic (congestive) heart failure: Secondary | ICD-10-CM | POA: Diagnosis not present

## 2015-09-01 LAB — CUP PACEART INCLINIC DEVICE CHECK
Battery Voltage: 3.01 V
Brady Statistic AP VP Percent: 58.34 %
Brady Statistic AS VS Percent: 0.19 %
HIGH POWER IMPEDANCE MEASURED VALUE: 342 Ohm
HIGH POWER IMPEDANCE MEASURED VALUE: 71 Ohm
Implantable Lead Implant Date: 20130110
Implantable Lead Location: 753859
Implantable Lead Model: 4196
Implantable Lead Model: 6935
Lead Channel Impedance Value: 456 Ohm
Lead Channel Impedance Value: 722 Ohm
Lead Channel Pacing Threshold Amplitude: 0.875 V
Lead Channel Pacing Threshold Pulse Width: 0.4 ms
Lead Channel Sensing Intrinsic Amplitude: 0.875 mV
Lead Channel Sensing Intrinsic Amplitude: 16.75 mV
Lead Channel Setting Pacing Amplitude: 2.5 V
Lead Channel Setting Pacing Amplitude: 5 V
Lead Channel Setting Pacing Pulse Width: 0.4 ms
MDC IDC LEAD IMPLANT DT: 20130110
MDC IDC LEAD IMPLANT DT: 20130110
MDC IDC LEAD LOCATION: 753858
MDC IDC LEAD LOCATION: 753860
MDC IDC MSMT LEADCHNL LV IMPEDANCE VALUE: 456 Ohm
MDC IDC MSMT LEADCHNL LV IMPEDANCE VALUE: 456 Ohm
MDC IDC MSMT LEADCHNL RA IMPEDANCE VALUE: 418 Ohm
MDC IDC MSMT LEADCHNL RA PACING THRESHOLD AMPLITUDE: 0.625 V
MDC IDC MSMT LEADCHNL RA PACING THRESHOLD PULSEWIDTH: 0.4 ms
MDC IDC MSMT LEADCHNL RA SENSING INTR AMPL: 2.75 mV
MDC IDC MSMT LEADCHNL RV SENSING INTR AMPL: 8.125 mV
MDC IDC SESS DTM: 20170206101625
MDC IDC SET LEADCHNL LV PACING PULSEWIDTH: 0.8 ms
MDC IDC SET LEADCHNL RA PACING AMPLITUDE: 2.25 V
MDC IDC SET LEADCHNL RV SENSING SENSITIVITY: 0.3 mV
MDC IDC STAT BRADY AP VS PERCENT: 0 %
MDC IDC STAT BRADY AS VP PERCENT: 41.47 %
MDC IDC STAT BRADY RA PERCENT PACED: 58.34 %
MDC IDC STAT BRADY RV PERCENT PACED: 99.8 %

## 2015-09-01 NOTE — Progress Notes (Signed)
HPI [name] returns today for followup. He is a pleasant 75 yo man with a non-ischemic CM, chronic systolic heart failure, atrial fib, and VT. He is s/p ICD implant.  He has had no syncope. He was hospitalized 2 weeks ago with VT storm. His VT was in the 145 range and could not be paced out. He was given an additional dose of amio and returns for additional followup. He feels poorly on the higher dose of amio. When he was shocked out of VT, he was also shocked out of atrial fib/flutter. He was noted to have had worsening of his AV conduction.  No Known Allergies   Current Outpatient Prescriptions  Medication Sig Dispense Refill  . amiodarone (PACERONE) 200 MG tablet Take 2 tablets (400 mg total) by mouth daily. 60 tablet 3  . folic acid (FOLVITE) 1 MG tablet Take 1 mg by mouth daily.     Marland Kitchen levothyroxine (SYNTHROID, LEVOTHROID) 75 MCG tablet Take 1 tablet (75 mcg total) by mouth daily. 30 tablet 6  . lisinopril (PRINIVIL,ZESTRIL) 10 MG tablet Take 10 mg by mouth daily.  9  . Multiple Vitamins-Minerals (MULTIVITAL) tablet Take 1 tablet by mouth daily.     . Omega-3 Fatty Acids (FISH OIL) 1000 MG CAPS Take 1 capsule by mouth daily.     Marland Kitchen warfarin (COUMADIN) 5 MG tablet Take 2.5-5 mg by mouth daily. 2.5 mg on Tu/Th, all other days are 5 mg.     No current facility-administered medications for this visit.     Past Medical History  Diagnosis Date  . Nonischemic cardiomyopathy ([place])   . 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 Psychosexual dysfunction with inhibited sexual excitement   . Conductive hearing loss   . Syncope and collapse   . Atrial fibrillation ([place])   . Intraspinal abscess   . Mitral valve insufficiency and aortic valve insufficiency     s/p MVR mechanical  . S/P mitral valve replacement   . Unspecified sleep apnea     last sleep study 11/07  . Ventricular tachycardia ([place])     ROS:   All systems reviewed and negative except as noted in the HPI.   Past Surgical History  Procedure Laterality Date  . Mitral valve replacement      w #[address] [facility]  . Valve replacement  2000  . Evacution of epidural lumbar epidural abscess  1999  . Thyroidectomy    . Tonsillectomy    . Dental implants    . Cardiac catheterization  04/24/2002  . Insert / replace / remove pacemaker  11/2008  . Cardioversion  03/09/2012    Procedure: CARDIOVERSION;  Surgeon: Evans Lance, MD;  Location: [facility];  Service: Cardiovascular;  Laterality: N/A;  . Cardioversion N/A 11/22/2013    Procedure: CARDIOVERSION;  Surgeon: Sanda Klein, MD;  Location: [name];  Service: Cardiovascular;  Laterality: N/A;  . Permanent pacemaker insertion N/A 08/05/2011    Procedure: PERMANENT PACEMAKER INSERTION;  Surgeon: Evans Lance, MD;  Location: [facility] C[facility] LAB;  Service: Cardiovascular;  Laterality: N/A;     Family History  Problem Relation Age of Onset  . Heart disease Mother   . Heart failure Mother   . Heart disease Father   . Heart failure Father      Social History   Social History  . Marital Status: Married    Spouse Name: N/A  . Number  of Children: N/A  . Years of Education: N/A   Occupational History  . Not on file.   Social History Main Topics  . Smoking status: Never Smoker   . Smokeless tobacco: Not on file  . Alcohol Use: Yes     Comment: occasionally  . Drug Use: No  . Sexual Activity: Not on file   Other Topics Concern  . Not on file   Social History Narrative     BP 102/58 mmHg  Pulse 66  Ht 6' 1.5" (1.867 m)  Wt 203 lb (92.08 kg)  BMI 26.42 kg/m2  Physical Exam:  Well appearing 75 year old man, NAD HEENT: Unremarkable Neck:  7 cm JVD, no thyromegally Back:  No CVA tenderness Lungs:  Clear with no wheezes, rales, or rhonchi.  Well-healed ICD incision HEART:  Regular rate rhythm, no murmurs, no rubs, no clicks, mechanical S1. Abd:  soft, positive bowel  sounds, no organomegally, no rebound, no guarding Ext:  2 plus pulses, trace peripheral edema, no cyanosis, no clubbing Skin:  No rashes no nodules Neuro:  CN II through XII intact, motor grossly intact  ECG - NSR with ventricular pacing  DEVICE  Normal device function.  See PaceArt for details.   Assess/Plan: 1. VT - he has had recurrent VT and is now on higher dose of amio. Will schedule VT ablation as he has had VT on amio. I have carefully discussed the risks/benefits/goals/expectations of the procedure with the patient and he wishes to proceed. 2. Chronic systolic heart failure - his symptoms are minimally worse with AV pacing (RV only). Today we have reprogrammed the device to pace in the LV as well 3. BiV ICD - his device is working normally. Interogation demonstrates no recurrent VT. His LV lead was turned back on today.  4. Atrial fib - he will continue his current meds. 5. Anti-coag - with his mechanical mitral valve, will plan to hold coumadin 2 days prior to his procedure and give lovenox one day before (one dose only).   Mikle Bosworth.D.

## 2015-09-01 NOTE — Patient Instructions (Addendum)
Medication Instructions:  Your physician recommends that you continue on your current medications as directed. Please refer to the Current Medication list given to you today.  Labwork: Your physician recommends that you return for lab work on 09/12/15 for pre procedure labs and INR. (needs to be early a.m. appointment)  Testing/Procedures: Your physician has recommended that you have a VT ablation. Catheter ablation is a medical procedure used to treat some cardiac arrhythmias (irregular heartbeats). During catheter ablation, a long, thin, flexible tube is put into a blood vessel in your groin (upper thigh), or neck. This tube is called an ablation catheter. It is then guided to your heart through the blood vessel. Radio frequency waves destroy small areas of heart tissue where abnormal heartbeats may cause an arrhythmia to start. Please see the instruction sheet given to you today.  Follow-Up: Your physician recommends that you schedule a follow-up appointment in: 4 weeks with Amber/Renee, after your ablation procedure on 09/15/15.  Your physician recommends that you schedule a follow-up appointment in: 3 months with Dr. Lovena Le, after your ablation procedure on 09/15/15.  If you need a refill on your cardiac medications before your next appointment, please call your pharmacy.  Thank you for choosing [facility]!!     Any Other Special Instructions Will Be Listed Below (If Applicable).   Cardiac Ablation Cardiac ablation is a procedure to disable a small amount of heart tissue in very specific places. The heart has many electrical connections. Sometimes these connections are abnormal and can cause the heart to beat very fast or irregularly. By disabling some of the problem areas, heart rhythm can be improved or made normal. Ablation is done for people who:   Have Wolff-Parkinson-White syndrome.   Have other fast heart rhythms (tachycardia).   Have taken medicines for an abnormal  heart rhythm (arrhythmia) that resulted in:   No success.   Side effects.   May have a high-risk heartbeat that could result in death.  LET [facility] CARE PROVIDER KNOW ABOUT:   Any allergies you have or any previous reactions you have had to X-ray dye, food (such as seafood), medicine, or tape.   All medicines you are taking, including vitamins, herbs, eye drops, creams, and over-the-counter medicines.   Previous problems you or members of your family have had with the use of anesthetics.   Any blood disorders you have.   Previous surgeries or procedures (such as a kidney transplant) you have had.   Medical conditions you have (such as kidney failure).  RISKS AND COMPLICATIONS Generally, cardiac ablation is a safe procedure. However, problems can occur and include:   Increased risk of cancer. Depending on how long it takes to do the ablation, the dose of radiation can be high.  Bruising and bleeding where a thin, flexible tube (catheter) was inserted during the procedure.   Bleeding into the chest, especially into the sac that surrounds the heart (serious).  Need for a permanent pacemaker if the normal electrical system is damaged.   The procedure may not be fully effective, and this may not be recognized for months. Repeat ablation procedures are sometimes required. BEFORE THE PROCEDURE   Follow any instructions from your health care provider regarding eating and drinking before the procedure.   Take your medicines as directed at regular times with water, unless instructed otherwise by your health care provider. If you are taking diabetes medicine, including insulin, ask how you are to take it and if there are any special  instructions you should follow. It is common to adjust insulin dosing the day of the ablation.  PROCEDURE  An ablation is usually performed in a catheterization laboratory with the guidance of fluoroscopy. Fluoroscopy is a type of X-ray that  helps your health care provider see images of your heart during the procedure.   An ablation is a minimally invasive procedure. This means a small cut (incision) is made in either your neck or groin. Your health care provider will decide where to make the incision based on your medical history and physical exam.  An IV tube will be started before the procedure begins. You will be given an anesthetic or medicine to help you relax (sedative).  The skin on your neck or groin will be numbed. A needle will be inserted into a large vein in your neck or groin and catheters will be threaded to your heart.  A special dye that shows up on fluoroscopy pictures may be injected through the catheter. The dye helps your health care provider see the area of the heart that needs treatment.  The catheter has electrodes on the tip. When the area of heart tissue that is causing the arrhythmia is found, the catheter tip will send an electrical current to the area and "scar" the tissue. Three types of energy can be used to ablate the heart tissue:   Heat (radiofrequency energy).   Laser energy.   Extreme cold (cryoablation).   When the area of the heart has been ablated, the catheter will be taken out. Pressure will be held on the insertion site. This will help the insertion site clot and keep it from bleeding. A bandage will be placed on the insertion site.  AFTER THE PROCEDURE   After the procedure, you will be taken to a recovery area where your vital signs (blood pressure, heart rate, and breathing) will be monitored. The insertion site will also be monitored for bleeding.   You will need to lie still for 4-6 hours. This is to ensure you do not bleed from the catheter insertion site.    This information is not intended to replace advice given to you by your health care provider. Make sure you discuss any questions you have with your health care provider.   Document Released: 11/28/2008 Document  Revised: 08/02/2014 Document Reviewed: 12/04/2012 Elsevier Interactive Patient Education [employer].

## 2015-09-03 ENCOUNTER — Ambulatory Visit: Payer: Medicare Other | Admitting: Podiatry

## 2015-09-03 ENCOUNTER — Ambulatory Visit (INDEPENDENT_AMBULATORY_CARE_PROVIDER_SITE_OTHER): Payer: Medicare Other | Admitting: Interventional Cardiology

## 2015-09-03 DIAGNOSIS — Z5181 Encounter for therapeutic drug level monitoring: Secondary | ICD-10-CM

## 2015-09-03 LAB — POCT INR: INR: 3.3

## 2015-09-03 NOTE — Addendum Note (Signed)
Addended by: Freada Bergeron on: 09/03/2015 10:21 AM   Modules accepted: Orders

## 2015-09-12 ENCOUNTER — Telehealth: Payer: Self-pay | Admitting: *Deleted

## 2015-09-12 ENCOUNTER — Other Ambulatory Visit (INDEPENDENT_AMBULATORY_CARE_PROVIDER_SITE_OTHER): Payer: Medicare Other | Admitting: *Deleted

## 2015-09-12 ENCOUNTER — Other Ambulatory Visit: Payer: Self-pay | Admitting: *Deleted

## 2015-09-12 DIAGNOSIS — Z01812 Encounter for preprocedural laboratory examination: Secondary | ICD-10-CM

## 2015-09-12 DIAGNOSIS — I472 Ventricular tachycardia, unspecified: Secondary | ICD-10-CM

## 2015-09-12 LAB — CBC WITH DIFFERENTIAL/PLATELET
BASOS PCT: 1 % (ref 0–1)
Basophils Absolute: 0.1 10*3/uL (ref 0.0–0.1)
Eosinophils Absolute: 0.1 10*3/uL (ref 0.0–0.7)
Eosinophils Relative: 2 % (ref 0–5)
HCT: 44.8 % (ref 39.0–52.0)
HEMOGLOBIN: 14.5 g/dL (ref 13.0–17.0)
LYMPHS ABS: 1 10*3/uL (ref 0.7–4.0)
Lymphocytes Relative: 15 % (ref 12–46)
MCH: 29.1 pg (ref 26.0–34.0)
MCHC: 32.4 g/dL (ref 30.0–36.0)
MCV: 90 fL (ref 78.0–100.0)
MONOS PCT: 11 % (ref 3–12)
MPV: 11.3 fL (ref 8.6–12.4)
Monocytes Absolute: 0.7 10*3/uL (ref 0.1–1.0)
NEUTROS ABS: 4.8 10*3/uL (ref 1.7–7.7)
NEUTROS PCT: 71 % (ref 43–77)
Platelets: 189 10*3/uL (ref 150–400)
RBC: 4.98 MIL/uL (ref 4.22–5.81)
RDW: 14.7 % (ref 11.5–15.5)
WBC: 6.8 10*3/uL (ref 4.0–10.5)

## 2015-09-12 LAB — BASIC METABOLIC PANEL
BUN: 24 mg/dL (ref 7–25)
CHLORIDE: 100 mmol/L (ref 98–110)
CO2: 23 mmol/L (ref 20–31)
Calcium: 8.7 mg/dL (ref 8.6–10.3)
Creat: 1.22 mg/dL — ABNORMAL HIGH (ref 0.70–1.18)
Glucose, Bld: 97 mg/dL (ref 65–99)
Potassium: 4.2 mmol/L (ref 3.5–5.3)
SODIUM: 135 mmol/L (ref 135–146)

## 2015-09-12 LAB — PROTIME-INR
INR: 2.64 — ABNORMAL HIGH (ref <1.50)
PROTHROMBIN TIME: 28.6 s — AB (ref 11.6–15.2)

## 2015-09-12 NOTE — Telephone Encounter (Signed)
Received blood INR showing 2.64. Reviewed with Dr. Lovena Le: Change instructions to:  Take regular dose of coumadin tonight  Take 1/2 dose on Saturday  Hold Sunday's dose  Have attempted to reach patient several times unsuccessfully. Will attempt to reach patient over the weekend to give verbal instructions. Will notify Dr Lovena Le if unable to reach patient by tomorrow evening.

## 2015-09-12 NOTE — Telephone Encounter (Addendum)
Patient called in reporting INR 3.2 (self-stick at home) Per Dr. Lovena Le :    Take Coumadin tonight.  Hold tomorrow & Sunday. Informed patient of instructions who verbalized understanding. Dr. Lovena Le wants INR level drawn in hospital morning of procedure. Order placed in EPIC for stat INR upon arrival to hospital.

## 2015-09-14 ENCOUNTER — Telehealth: Payer: Self-pay | Admitting: *Deleted

## 2015-09-14 NOTE — Telephone Encounter (Signed)
See 2/17 telephone note for more details.  Patient returns my call today.  Spoke with patient about 10:00 a.m. He did not take Coumadin last night due to previous instructions (see 2/17 telephone note). Reviewed w/ Dr. Lovena Le - order received for patient to take 1/2 Coumadin dose tonight. Have left detailed message on pt's voicemail instructing of above order. Asked pt to call me back verifying confirmation of above order.

## 2015-09-15 ENCOUNTER — Encounter (HOSPITAL_COMMUNITY): Payer: Self-pay | Admitting: Internal Medicine

## 2015-09-15 ENCOUNTER — Ambulatory Visit (HOSPITAL_COMMUNITY)
Admission: RE | Admit: 2015-09-15 | Discharge: 2015-09-16 | Disposition: A | Discharge status: Home / Self Care | Payer: Medicare Other | Source: Ambulatory Visit | Attending: Internal Medicine | Admitting: Internal Medicine

## 2015-09-15 ENCOUNTER — Encounter (HOSPITAL_COMMUNITY)
Admission: RE | Disposition: A | Discharge status: Home / Self Care | Payer: Self-pay | Source: Ambulatory Visit | Attending: Internal Medicine

## 2015-09-15 DIAGNOSIS — I481 Persistent atrial fibrillation: Secondary | ICD-10-CM | POA: Diagnosis not present

## 2015-09-15 DIAGNOSIS — I5022 Chronic systolic (congestive) heart failure: Secondary | ICD-10-CM | POA: Insufficient documentation

## 2015-09-15 DIAGNOSIS — Z8249 Family history of ischemic heart disease and other diseases of the circulatory system: Secondary | ICD-10-CM | POA: Diagnosis not present

## 2015-09-15 DIAGNOSIS — G4730 Sleep apnea, unspecified: Secondary | ICD-10-CM | POA: Diagnosis not present

## 2015-09-15 DIAGNOSIS — I472 Ventricular tachycardia: Secondary | ICD-10-CM | POA: Diagnosis not present

## 2015-09-15 DIAGNOSIS — Z95810 Presence of automatic (implantable) cardiac defibrillator: Secondary | ICD-10-CM | POA: Diagnosis not present

## 2015-09-15 DIAGNOSIS — Z952 Presence of prosthetic heart valve: Secondary | ICD-10-CM | POA: Insufficient documentation

## 2015-09-15 DIAGNOSIS — Z7901 Long term (current) use of anticoagulants: Secondary | ICD-10-CM | POA: Diagnosis not present

## 2015-09-15 DIAGNOSIS — I429 Cardiomyopathy, unspecified: Secondary | ICD-10-CM | POA: Diagnosis not present

## 2015-09-15 HISTORY — PX: ELECTROPHYSIOLOGIC STUDY: SHX172A

## 2015-09-15 LAB — POCT ACTIVATED CLOTTING TIME
ACTIVATED CLOTTING TIME: 240 s
ACTIVATED CLOTTING TIME: 250 s
ACTIVATED CLOTTING TIME: 260 s
ACTIVATED CLOTTING TIME: 209 s
ACTIVATED CLOTTING TIME: 183 s
Activated Clotting Time: 188 seconds

## 2015-09-15 LAB — PROTIME-INR
INR: 2.03 — ABNORMAL HIGH (ref 0.00–1.49)
Prothrombin Time: 22.8 seconds — ABNORMAL HIGH (ref 11.6–15.2)

## 2015-09-15 SURGERY — V TACH ABLATION

## 2015-09-15 MED ORDER — MIDAZOLAM HCL 5 MG/5ML IJ SOLN
INTRAMUSCULAR | Status: AC
  Filled 2015-09-15: qty 5

## 2015-09-15 MED ORDER — BUPIVACAINE HCL (PF) 0.25 % IJ SOLN
INTRAMUSCULAR | Status: AC
  Filled 2015-09-15: qty 30

## 2015-09-15 MED ORDER — FENTANYL CITRATE (PF) 100 MCG/2ML IJ SOLN
INTRAMUSCULAR | Status: AC
  Filled 2015-09-15: qty 2

## 2015-09-15 MED ORDER — WARFARIN - PHYSICIAN DOSING INPATIENT: Freq: Every day | Status: DC

## 2015-09-15 MED ORDER — ACETAMINOPHEN 325 MG PO TABS: 650.0000 mg | ORAL_TABLET | ORAL | Status: DC | PRN

## 2015-09-15 MED ORDER — HEPARIN (PORCINE) IN NACL 2-0.9 UNIT/ML-% IJ SOLN
INTRAMUSCULAR | Status: DC | PRN
  Administered 2015-09-15 (×2)

## 2015-09-15 MED ORDER — FOLIC ACID 1 MG PO TABS
1.0000 mg | ORAL_TABLET | Freq: Every day | ORAL | Status: DC
  Administered 2015-09-16: 1 mg via ORAL
  Filled 2015-09-15: qty 1

## 2015-09-15 MED ORDER — WARFARIN SODIUM 2.5 MG PO TABS: 2.5000 mg | ORAL_TABLET | ORAL | Status: DC

## 2015-09-15 MED ORDER — MIDAZOLAM HCL 5 MG/5ML IJ SOLN
INTRAMUSCULAR | Status: DC | PRN
  Administered 2015-09-15 (×2): 2 mg via INTRAVENOUS
  Administered 2015-09-15 (×4): 1 mg via INTRAVENOUS
  Administered 2015-09-15: 2 mg via INTRAVENOUS
  Administered 2015-09-15: 1 mg via INTRAVENOUS

## 2015-09-15 MED ORDER — SODIUM CHLORIDE 0.9% FLUSH
3.0000 mL | Freq: Two times a day (BID) | INTRAVENOUS | Status: DC
  Administered 2015-09-15: 3 mL via INTRAVENOUS

## 2015-09-15 MED ORDER — FENTANYL CITRATE (PF) 100 MCG/2ML IJ SOLN
INTRAMUSCULAR | Status: DC | PRN
  Administered 2015-09-15: 12.5 ug via INTRAVENOUS
  Administered 2015-09-15: 25 ug via INTRAVENOUS
  Administered 2015-09-15: 12.5 ug via INTRAVENOUS
  Administered 2015-09-15: 25 ug via INTRAVENOUS
  Administered 2015-09-15: 12.5 ug via INTRAVENOUS

## 2015-09-15 MED ORDER — LEVOTHYROXINE SODIUM 75 MCG PO TABS
75.0000 ug | ORAL_TABLET | Freq: Every day | ORAL | Status: DC
Start: 2015-09-16 — End: 2015-09-16
  Administered 2015-09-16: 75 ug via ORAL
  Filled 2015-09-15: qty 1

## 2015-09-15 MED ORDER — ONDANSETRON HCL 4 MG/2ML IJ SOLN: 4.0000 mg | Freq: Four times a day (QID) | INTRAMUSCULAR | Status: DC | PRN

## 2015-09-15 MED ORDER — AMIODARONE HCL 200 MG PO TABS
400.0000 mg | ORAL_TABLET | Freq: Every day | ORAL | Status: DC
  Administered 2015-09-15 – 2015-09-16 (×2): 400 mg via ORAL
  Filled 2015-09-15 (×2): qty 2

## 2015-09-15 MED ORDER — BUPIVACAINE HCL (PF) 0.25 % IJ SOLN
INTRAMUSCULAR | Status: DC | PRN
  Administered 2015-09-15: 50 mL

## 2015-09-15 MED ORDER — SODIUM CHLORIDE 0.9 % IV SOLN: 250.0000 mL | INTRAVENOUS | Status: DC | PRN

## 2015-09-15 MED ORDER — WARFARIN SODIUM 5 MG PO TABS
5.0000 mg | ORAL_TABLET | ORAL | Status: DC
  Administered 2015-09-15: 5 mg via ORAL
  Filled 2015-09-15: qty 1

## 2015-09-15 MED ORDER — HEPARIN (PORCINE) IN NACL 2-0.9 UNIT/ML-% IJ SOLN
INTRAMUSCULAR | Status: AC
  Filled 2015-09-15: qty 500

## 2015-09-15 MED ORDER — WARFARIN SODIUM 2.5 MG PO TABS: 2.5000 mg | ORAL_TABLET | Freq: Every day | ORAL | Status: DC

## 2015-09-15 MED ORDER — SODIUM CHLORIDE 0.9% FLUSH: 3.0000 mL | INTRAVENOUS | Status: DC | PRN

## 2015-09-15 MED ORDER — HEPARIN SODIUM (PORCINE) 1000 UNIT/ML IJ SOLN
INTRAMUSCULAR | Status: AC
  Filled 2015-09-15: qty 1

## 2015-09-15 MED ORDER — ADULT MULTIVITAMIN W/MINERALS CH
1.0000 | ORAL_TABLET | Freq: Every day | ORAL | Status: DC
Start: 2015-09-16 — End: 2015-09-16
  Administered 2015-09-16: 1 via ORAL
  Filled 2015-09-15 (×2): qty 1

## 2015-09-15 MED ORDER — LISINOPRIL 10 MG PO TABS
10.0000 mg | ORAL_TABLET | Freq: Every day | ORAL | Status: DC
  Filled 2015-09-15 (×2): qty 1

## 2015-09-15 MED ORDER — HEPARIN SODIUM (PORCINE) 1000 UNIT/ML IJ SOLN
INTRAMUSCULAR | Status: DC | PRN
  Administered 2015-09-15: 7000 [IU] via INTRAVENOUS
  Administered 2015-09-15: 2000 [IU] via INTRAVENOUS
  Administered 2015-09-15: 1000 [IU] via INTRAVENOUS
  Administered 2015-09-15: 2000 [IU] via INTRAVENOUS

## 2015-09-15 SURGICAL SUPPLY — 13 items
BAG SNAP BAND KOVER 36X36 (MISCELLANEOUS) ×3 IMPLANT
CATH JOSEPH QUAD ALLRED 6F REP (CATHETERS) ×6 IMPLANT
CATH NAVISTAR SMARTTOUCH DF (ABLATOR) ×3 IMPLANT
PACK EP LATEX FREE (CUSTOM PROCEDURE TRAY) ×2
PACK EP LF (CUSTOM PROCEDURE TRAY) ×1 IMPLANT
PAD DEFIB LIFELINK (PAD) ×3 IMPLANT
PATCH CARTO3 (PAD) ×3 IMPLANT
SHEATH PINNACLE 6F 10CM (SHEATH) ×6 IMPLANT
SHEATH PINNACLE 8F 10CM (SHEATH) ×6 IMPLANT
SHIELD RADPAD SCOOP 12X17 (MISCELLANEOUS) ×3 IMPLANT
TUBING ART PRESS 72  MALE/FEM (TUBING) ×2
TUBING ART PRESS 72 MALE/FEM (TUBING) ×1 IMPLANT
TUBING SMART ABLATE COOLFLOW (TUBING) ×3 IMPLANT

## 2015-09-15 NOTE — Interval H&P Note (Signed)
History and Physical Interval Note:  09/15/2015 7:07 AM  Austin [name]  has presented today for surgery, with the diagnosis of vt  The various methods of treatment have been discussed with the patient and family. After consideration of risks, benefits and other options for treatment, the patient has consented to  Procedure(s): V Tach Ablation (N/A) as a surgical intervention .  The patient's history has been reviewed, patient examined, no change in status, stable for surgery.  I have reviewed the patient's chart and labs.  Questions were answered to the patient's satisfaction.     [name]

## 2015-09-15 NOTE — Discharge Summary (Signed)
ELECTROPHYSIOLOGY PROCEDURE DISCHARGE SUMMARY    Patient ID: Austin [name],  MRN: [id number], DOB/AGE: 01-24-1941 75 y.o.  Admit date: 09/15/2015 Discharge date: 09/16/2015  Primary Care Physician: Mathews Argyle, MD Electrophysiologist: Lovena Le  Primary Discharge Diagnosis:  Ventricular tachycardia s/p ablation this admission  Secondary Diagnosis: 1.  NICM 2.  S/p MVR 3.  Persistent atrial fibrillation 4.  Chronic systolic heart failure  No Known Allergies  Procedures This Admission:  1.  Electrophysiology study and radiofrequency catheter ablation on 09/15/15 by Dr Lovena Le.  This study demonstrated successful catheter ablation of 2 distinct ventricular tachycardias originating from the mitral valve annulus which appeared to correlate with the patient's clinical ventricular tachycardia. Unsuccessful ablation of the inducible ventricular tachycardia originating from the septal portion of the mitral valve annulus as previously noted above.   Brief HPI:  Austin [name] is a 75 y.o. male with a past medical history as outlined above. He has recently had increased VT burden despite higher doses of amiodarone. Risks, benefits to ablation were reviewed with the patient who wished to proceed.   Hospital Course:  The patient was admitted and underwent ablation with details as outlined above.  He was monitored on telemetry overnight which demonstrated sinus rhythm.  His groin and neck incisions were wtihout complication.  He was seen by Dr Lovena Le and considered stable for discharge to home.   Physical Exam: Filed Vitals:   09/15/15 1800 09/15/15 1830 09/15/15 2030 09/16/15 0459  BP: 96/57 95/58 95/57  104/54  Pulse:  65  61  Temp:  99.1 F (37.3 C)  98.5 F (36.9 C)  TempSrc:  Oral  Oral  Resp:  18  16  Height:      Weight:      SpO2:  95% 93% 95%    GEN- The patient is well appearing, alert and oriented x 3 today.   HEENT: normocephalic, atraumatic; sclera clear,  conjunctiva pink; hearing intact; oropharynx clear; neck supple Lungs- Clear to ausculation bilaterally, normal work of breathing.  No wheezes, rales, rhonchi Heart- Regular rate and rhythm, no murmurs, rubs or gallops GI- soft, non-tender, non-distended, bowel sounds present Extremities- no clubbing, cyanosis, or edema; DP/PT/radial pulses 2+ bilaterally, right groin with no hematoma. MS- no significant deformity or atrophy Skin- warm and dry, no rash or lesion Psych- euthymic mood, full affect Neuro- strength and sensation are intact    Labs:   Lab Results  Component Value Date   WBC 6.8 09/12/2015   HGB 14.5 09/12/2015   HCT 44.8 09/12/2015   MCV 90.0 09/12/2015   PLT 189 09/12/2015     Recent Labs Lab 09/12/15 0846  NA 135  K 4.2  CL 100  CO2 23  BUN 24  CREATININE 1.22*  CALCIUM 8.7  GLUCOSE 97     Discharge Medications:  Current Discharge Medication List    CONTINUE these medications which have NOT CHANGED   Details  amiodarone (PACERONE) 200 MG tablet Take 2 tablets (400 mg total) by mouth daily. Qty: 60 tablet, Refills: 3    folic acid (FOLVITE) 1 MG tablet Take 1 mg by mouth daily.    Associated Diagnoses: Chronic systolic heart failure (HCC)    levothyroxine (SYNTHROID, LEVOTHROID) 75 MCG tablet Take 1 tablet (75 mcg total) by mouth daily. Qty: 30 tablet, Refills: 6    lisinopril (PRINIVIL,ZESTRIL) 10 MG tablet Take 10 mg by mouth daily. Refills: 9   Associated Diagnoses: Chronic systolic heart failure ([place])  Multiple Vitamins-Minerals (MULTIVITAL) tablet Take 1 tablet by mouth daily.     Omega-3 Fatty Acids (FISH OIL) 1000 MG CAPS Take 1 capsule by mouth daily.     warfarin (COUMADIN) 5 MG tablet Take 2.5-5 mg by mouth daily. 2.5 mg on Tu/Th, all other days are 5 mg.        Disposition: Pt is being discharged home today in good condition.  Follow-up Information    Follow up with Baldwin Jamaica, PA-C On 10/13/2015.   Specialty:   Cardiology   Why:  at [facility] information:   [place] [id number] [phone]       Duration of Discharge Encounter: Greater than 30 minutes including physician time.  Signed, Chanetta Marshall, NP 09/16/2015 7:30 AM  EP Attending  Patient seen and examined. Agree with the findings as noted above. He is stable for discharge. He will need lovenox bridging post ablation.  Mikle Bosworth.D

## 2015-09-15 NOTE — Progress Notes (Signed)
ACT Drawn: Results 188. Sheaths intact, No oozing, bruising or hematoma present.

## 2015-09-15 NOTE — Progress Notes (Addendum)
Site area: RFA x 1/RFV x 2 Site Prior to Removal:  Level 0 Pressure Applied For:44min Manual: yes   Patient Status During Pull:  stable Post Pull Site:  Level 0 Post Pull Instructions Given: yes  Post Pull Pulses Present: palpable Dressing Applied:clear   Bedrest begins @ 1430 till 2030 Comments:

## 2015-09-15 NOTE — H&P (View-Only) (Signed)
HPI [name] returns today for followup. He is a pleasant 75 yo man with a non-ischemic CM, chronic systolic heart failure, atrial fib, and VT. He is s/p ICD implant.  He has had no syncope. He was hospitalized 2 weeks ago with VT storm. His VT was in the 145 range and could not be paced out. He was given an additional dose of amio and returns for additional followup. He feels poorly on the higher dose of amio. When he was shocked out of VT, he was also shocked out of atrial fib/flutter. He was noted to have had worsening of his AV conduction.  No Known Allergies   Current Outpatient Prescriptions  Medication Sig Dispense Refill  . amiodarone (PACERONE) 200 MG tablet Take 2 tablets (400 mg total) by mouth daily. 60 tablet 3  . folic acid (FOLVITE) 1 MG tablet Take 1 mg by mouth daily.     Marland Kitchen levothyroxine (SYNTHROID, LEVOTHROID) 75 MCG tablet Take 1 tablet (75 mcg total) by mouth daily. 30 tablet 6  . lisinopril (PRINIVIL,ZESTRIL) 10 MG tablet Take 10 mg by mouth daily.  9  . Multiple Vitamins-Minerals (MULTIVITAL) tablet Take 1 tablet by mouth daily.     . Omega-3 Fatty Acids (FISH OIL) 1000 MG CAPS Take 1 capsule by mouth daily.     Marland Kitchen warfarin (COUMADIN) 5 MG tablet Take 2.5-5 mg by mouth daily. 2.5 mg on Tu/Th, all other days are 5 mg.     No current facility-administered medications for this visit.     Past Medical History  Diagnosis Date  . Nonischemic cardiomyopathy ([place])   . 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 Psychosexual dysfunction with inhibited sexual excitement   . Conductive hearing loss   . Syncope and collapse   . Atrial fibrillation ([place])   . Intraspinal abscess   . Mitral valve insufficiency and aortic valve insufficiency     s/p MVR mechanical  . S/P mitral valve replacement   . Unspecified sleep apnea     last sleep study 11/07  . Ventricular tachycardia ([place])     ROS:   All systems reviewed and negative except as noted in the HPI.   Past Surgical History  Procedure Laterality Date  . Mitral valve replacement      w #[address] [facility]  . Valve replacement  2000  . Evacution of epidural lumbar epidural abscess  1999  . Thyroidectomy    . Tonsillectomy    . Dental implants    . Cardiac catheterization  04/24/2002  . Insert / replace / remove pacemaker  11/2008  . Cardioversion  03/09/2012    Procedure: CARDIOVERSION;  Surgeon: Evans Lance, MD;  Location: [facility];  Service: Cardiovascular;  Laterality: N/A;  . Cardioversion N/A 11/22/2013    Procedure: CARDIOVERSION;  Surgeon: Sanda Klein, MD;  Location: [facility];  Service: Cardiovascular;  Laterality: N/A;  . Permanent pacemaker insertion N/A 08/05/2011    Procedure: PERMANENT PACEMAKER INSERTION;  Surgeon: Evans Lance, MD;  Location: [facility];  Service: Cardiovascular;  Laterality: N/A;     Family History  Problem Relation Age of Onset  . Heart disease Mother   . Heart failure Mother   . Heart disease Father   . Heart failure Father      Social History   Social History  . Marital Status: Married    Spouse Name: N/A  . Number  of Children: N/A  . Years of Education: N/A   Occupational History  . Not on file.   Social History Main Topics  . Smoking status: Never Smoker   . Smokeless tobacco: Not on file  . Alcohol Use: Yes     Comment: occasionally  . Drug Use: No  . Sexual Activity: Not on file   Other Topics Concern  . Not on file   Social History Narrative     BP 102/58 mmHg  Pulse 66  Ht 6' 1.5" (1.867 m)  Wt 203 lb (92.08 kg)  BMI 26.42 kg/m2  Physical Exam:  Well appearing 75 year old man, NAD HEENT: Unremarkable Neck:  7 cm JVD, no thyromegally Back:  No CVA tenderness Lungs:  Clear with no wheezes, rales, or rhonchi.  Well-healed ICD incision HEART:  Regular rate rhythm, no murmurs, no rubs, no clicks, mechanical S1. Abd:  soft, positive bowel  sounds, no organomegally, no rebound, no guarding Ext:  2 plus pulses, trace peripheral edema, no cyanosis, no clubbing Skin:  No rashes no nodules Neuro:  CN II through XII intact, motor grossly intact  ECG - NSR with ventricular pacing  DEVICE  Normal device function.  See PaceArt for details.   Assess/Plan: 1. VT - he has had recurrent VT and is now on higher dose of amio. Will schedule VT ablation as he has had VT on amio. I have carefully discussed the risks/benefits/goals/expectations of the procedure with the patient and he wishes to proceed. 2. Chronic systolic heart failure - his symptoms are minimally worse with AV pacing (RV only). Today we have reprogrammed the device to pace in the LV as well 3. BiV ICD - his device is working normally. Interogation demonstrates no recurrent VT. His LV lead was turned back on today.  4. Atrial fib - he will continue his current meds. 5. Anti-coag - with his mechanical mitral valve, will plan to hold coumadin 2 days prior to his procedure and give lovenox one day before (one dose only).   Austin [name].D.

## 2015-09-15 NOTE — Progress Notes (Signed)
Patient lying in bed, no distress, pain or needs expressed at this time.Call light within reach.

## 2015-09-16 DIAGNOSIS — I481 Persistent atrial fibrillation: Secondary | ICD-10-CM | POA: Diagnosis not present

## 2015-09-16 DIAGNOSIS — I429 Cardiomyopathy, unspecified: Secondary | ICD-10-CM | POA: Diagnosis not present

## 2015-09-16 DIAGNOSIS — I472 Ventricular tachycardia: Secondary | ICD-10-CM

## 2015-09-16 DIAGNOSIS — Z952 Presence of prosthetic heart valve: Secondary | ICD-10-CM | POA: Diagnosis not present

## 2015-09-16 LAB — PROTIME-INR
INR: 1.86 — AB (ref 0.00–1.49)
PROTHROMBIN TIME: 21.4 s — AB (ref 11.6–15.2)

## 2015-09-16 MED ORDER — ENOXAPARIN SODIUM 150 MG/ML ~~LOC~~ SOLN
150.0000 mg | Freq: Once | SUBCUTANEOUS | Status: AC
  Administered 2015-09-16: 150 mg via SUBCUTANEOUS
  Filled 2015-09-16: qty 1

## 2015-09-16 MED FILL — Heparin Sodium (Porcine) 2 Unit/ML in Sodium Chloride 0.9%: INTRAMUSCULAR | Qty: 1000 | Status: AC

## 2015-09-16 NOTE — Progress Notes (Signed)
INR 1.86 today, will need Lovenox bridge post ablation.  Discussed with Elberta Leatherwood, PharmD Lovenox 150mg  x1 now (will cover for 24 hours per Gay Filler), Coumadin 5mg  tonight.  Pt to check INR in the morning at home and call Gay Filler with results I discussed with patient who agrees with plan.  Chanetta Marshall, NP 09/16/2015 8:42 AM

## 2015-09-17 ENCOUNTER — Ambulatory Visit (INDEPENDENT_AMBULATORY_CARE_PROVIDER_SITE_OTHER): Payer: Medicare Other | Admitting: Pharmacist

## 2015-09-17 DIAGNOSIS — Z5181 Encounter for therapeutic drug level monitoring: Secondary | ICD-10-CM

## 2015-09-17 LAB — POCT INR: INR: 1.7

## 2015-09-17 MED ORDER — ENOXAPARIN SODIUM 150 MG/ML ~~LOC~~ SOLN
150.0000 mg | SUBCUTANEOUS | Status: DC
Start: 2015-09-17 — End: 2015-10-14

## 2015-09-18 ENCOUNTER — Ambulatory Visit: Payer: Medicare Other | Admitting: Internal Medicine

## 2015-09-19 ENCOUNTER — Ambulatory Visit (INDEPENDENT_AMBULATORY_CARE_PROVIDER_SITE_OTHER): Payer: Medicare Other | Admitting: Cardiovascular Disease

## 2015-09-19 DIAGNOSIS — Z5181 Encounter for therapeutic drug level monitoring: Secondary | ICD-10-CM

## 2015-09-19 LAB — POCT INR: INR: 4.3

## 2015-09-24 ENCOUNTER — Ambulatory Visit (INDEPENDENT_AMBULATORY_CARE_PROVIDER_SITE_OTHER): Payer: Medicare Other | Admitting: Cardiovascular Disease

## 2015-09-24 ENCOUNTER — Telehealth: Payer: Self-pay | Admitting: Internal Medicine

## 2015-09-24 ENCOUNTER — Encounter: Payer: Self-pay | Admitting: Internal Medicine

## 2015-09-24 DIAGNOSIS — Z5181 Encounter for therapeutic drug level monitoring: Secondary | ICD-10-CM

## 2015-09-24 LAB — POCT INR: INR: 3.2

## 2015-09-24 MED ORDER — METOPROLOL TARTRATE 25 MG PO TABS: 25.0000 mg | ORAL_TABLET | Freq: Two times a day (BID) | ORAL | Status: DC

## 2015-09-24 NOTE — Telephone Encounter (Signed)
Transmission reviewed by Dr Lovena Le and he is back in fib/flutter again.  Dr Lovena Le wants him to start Metoprolol 25 mg twice daily and keep his follow up as scheduled for 3/21 with PA.  He will call me back if needed

## 2015-09-24 NOTE — Telephone Encounter (Signed)
Spoke with patient's wife and they are going to send in transmission.  Will call back after Dr Hilma Favors

## 2015-09-24 NOTE — Telephone Encounter (Signed)
New message      Patient's pulse is irregular----ranging from 75-81.  Pt feels like his heart is pounding.  Pt had an ablation last Monday.  This am his bp was 115/75 and HR 44.  Please advise

## 2015-10-07 ENCOUNTER — Ambulatory Visit: Payer: Medicare Other | Admitting: Interventional Cardiology

## 2015-10-08 ENCOUNTER — Ambulatory Visit (INDEPENDENT_AMBULATORY_CARE_PROVIDER_SITE_OTHER): Payer: Medicare Other | Admitting: Pharmacist

## 2015-10-08 DIAGNOSIS — Z5181 Encounter for therapeutic drug level monitoring: Secondary | ICD-10-CM

## 2015-10-08 LAB — POCT INR: INR: 2.9

## 2015-10-13 ENCOUNTER — Ambulatory Visit: Payer: Medicare Other | Admitting: Physician Assistant

## 2015-10-13 NOTE — Progress Notes (Addendum)
Cardiology Office Note Date:  10/14/2015  Patient ID:  Austin [name], Austin [name] 01-05-1941, MRN [id number] PCP:  Mathews Argyle, MD  Cardiologist: dr. Tamala Julian Electrophysiologist:  Dr. Lovena Le    Chief Complaint: post VT ablation visit  History of Present Illness: Austin [name] is a 75 y.o. male with history of NICM/CRT-D, chronic CHF VHD with MVR (mechanical), persistent AFib and VT.  His EPS disclosed 4 different VTs, 2 were succesfuly ablated and felt to be his clinical VT.  He had a device transmission noting he was back in AFib/flutter and Dr. Lovena Le started metoprolol 25mg  BID.  He comes to the office today to be seen for Dr. Lovena Le.  Since his VT storm and hospital stay in January he has been feeling poorly.  Prior to that he was playing tennis regularly and doing well, since that time, he feels tired, no energy, napping even.  He has some DOE but no rest SOB, no symptoms of PND or orthopnea.  His home/morning weights generally 202-205, today was 205 at home.  He has waxing/waning ankle edema over the last few months.  He mentions sporadic CP as well, usually when up/around, tight across his chest or upper abdomen, it can last hours in duration and when he has it, is unchanged with position or exertion.  No dizzy spells, near syncope or syncope, no shocks from his device.  Since being started on the metoprolol especially he feels like his energy is worse though his palpitations for the most part are resolved.  He denies any bleeding or signs of bleeding on his coumadin.   Afib hx: DCCV 2013 and 2015  VT hx AAD therapy: Amiodarone only VT storm 08/22/15 VT with syncope and shocks 2011  Past Medical History  Diagnosis Date  . Nonischemic cardiomyopathy ([place])   . 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 Psychosexual dysfunction with inhibited sexual excitement   . Conductive hearing loss   .  Syncope and collapse   . Atrial fibrillation ([place])   . Intraspinal abscess   . Mitral valve insufficiency and aortic valve insufficiency     s/p MVR mechanical  . S/P mitral valve replacement   . Unspecified sleep apnea     last sleep study 11/07  . Ventricular tachycardia [facility])     Past Surgical History  Procedure Laterality Date  . Mitral valve replacement      w #[address] [facility]  . Valve replacement  2000  . Evacution of epidural lumbar epidural abscess  1999  . Thyroidectomy    . Tonsillectomy    . Dental implants    . Cardiac catheterization  04/24/2002  . Insert / replace / remove pacemaker  11/2008  . Cardioversion  03/09/2012    Procedure: CARDIOVERSION;  Surgeon: Evans Lance, MD;  Location: [facility];  Service: Cardiovascular;  Laterality: N/A;  . Cardioversion N/A 11/22/2013    Procedure: CARDIOVERSION;  Surgeon: Sanda Klein, MD;  Location: [place] [facility];  Service: Cardiovascular;  Laterality: N/A;  . Permanent pacemaker insertion N/A 08/05/2011    Procedure: PERMANENT PACEMAKER INSERTION;  Surgeon: Evans Lance, MD;  Location: [facility] CATH LAB;  Service: Cardiovascular;  Laterality: N/A;  . Electrophysiologic study N/A 09/15/2015    Procedure: Stephanie Coup Ablation;  Surgeon: Evans Lance, MD;  Location: [place] CV LAB;  Service: Cardiovascular;  Laterality: N/A;    Current Outpatient Prescriptions  Medication Sig  Dispense Refill  . amiodarone (PACERONE) 200 MG tablet Take 2 tablets (400 mg total) by mouth daily. 60 tablet 3  . folic acid (FOLVITE) 1 MG tablet Take 1 mg by mouth daily.     Marland Kitchen levothyroxine (SYNTHROID, LEVOTHROID) 75 MCG tablet Take 1 tablet (75 mcg total) by mouth daily. 30 tablet 6  . lisinopril (PRINIVIL,ZESTRIL) 10 MG tablet Take 10 mg by mouth daily.  9  . metoprolol tartrate (LOPRESSOR) 25 MG tablet Take 1 tablet (25 mg total) by mouth 2 (two) times daily. 180 tablet 3  . Multiple Vitamins-Minerals (MULTIVITAL) tablet Take 1 tablet by mouth daily.       . Omega-3 Fatty Acids (FISH OIL) 1000 MG CAPS Take 1 capsule by mouth daily.     Marland Kitchen warfarin (COUMADIN) 5 MG tablet Take 2.5-5 mg by mouth daily. 2.5 mg on Tu/Th, all other days are 5 mg.     No current facility-administered medications for this visit.    Allergies:   Review of patient's allergies indicates no known allergies.   Social History:  The patient  reports that he has never smoked. He does not have any smokeless tobacco history on file. He reports that he drinks alcohol. He reports that he does not use illicit drugs.   Family History:  The patient's family history includes Heart disease in his father and mother; Heart failure in his father and mother.  ROS:  Please see the history of present illness.  All other systems are reviewed and otherwise negative.   PHYSICAL EXAM:  VS:  Pulse 79  Ht 6\' 2"  (1.88 m)  Wt 208 lb (94.348 kg)  BMI 26.69 kg/m2 BMI: Body mass index is 26.69 kg/(m^2). Well nourished, well developed, in no acute distress HEENT: normocephalic, atraumatic Neck: no JVD, carotid bruits or masses Cardiac:  Mechanical valve appreciated; no significant murmurs, no rubs, or gallops Lungs:  clear to auscultation bilaterally, no wheezing, rhonchi or rales Abd: soft, nontenderMS: no deformity or atrophy Ext: trace ankle edema, chronic looking skin changes b/l Skin: warm and dry, no rash Neuro:  No gross deficits appreciated Psych: euthymic mood, full affect  ICD site is stable, no tethering or discomfort   EKG:  Done today is V paced ICD check today: he is in AFlutter, he had 3 episodes of VT on March 25th under his therapy rate, none since, his LV lead threshhold reprogrammed, see pace-art.  Bive pacing 82.9%  08/23/15: Echocardiogram Study Conclusions - Left ventricle: The cavity size was moderately dilated. Wall  thickness was normal. Systolic function was moderately to  severely reduced. The estimated ejection fraction was in the  range of 30% to 35%.  Diffuse hypokinesis. The study is not  technically sufficient to allow evaluation of LV diastolic  function. - Aortic valve: Trileaflet. Sclerosis without stenosis. There was  no regurgitation. - Mitral valve: Tilting bileaflet mechanical valve without  obstruction. Normal bileaflet motion. Trivial regurgitation. Mean  gradient (D): 4 mm Hg. Valve area by pressure half-time: 1.85  cm^2. Valve area by continuity equation (using LVOT flow): 1.54  cm^2. - Left atrium: Severely dilated at 58 ml/m2. - Right ventricle: The cavity size was moderately dilated. Systolic  function is reduced. Lateral annulus peak S velocity: 8.5 cm/s. - Right atrium: Severely dilated at 29 cm2. - Tricuspid valve: There was mild regurgitation. - Pulmonary arteries: PA peak pressure: 32 mm Hg (S) + RAP. - Systemic veins: IVC not visualized. Impressions: - Compared to a prior study in  2012, the EF is lower at 30-35% -  moderate dilation with normal wall thickness and global  hypokinesis. The RV is also dilated with reduced systolic  function. The mechanical mitral valve is functioning normally  without obstruction. There is severe biatrial enlargement.  T 09/15/15: VT ablation, Dr. Lovena Le Conclusion: Successful catheter ablation of 2 distinct ventricular tachycardias originating from the mitral valve annulus which appeared to correlate with the patient's clinical ventricular tachycardia. Unsuccessful ablation of the inducible ventricular tachycardia originating from the septal portion of the mitral valve annulus as previously noted above.   Recent Labs: 08/22/2015: B Natriuretic Peptide 809.3*; Magnesium 2.3 08/23/2015: ALT 48; TSH 6.967* 09/12/2015: BUN 24; Creat 1.22*; Hemoglobin 14.5; Platelets 189; Potassium 4.2; Sodium 135  08/23/2015: Cholesterol 158; HDL 50; LDL Cholesterol 82; Total CHOL/HDL Ratio 3.2; Triglycerides 130; VLDL 26   CrCl cannot be calculated (Patient has no serum creatinine  result on file.).   Wt Readings from Last 3 Encounters:  10/14/15 208 lb (94.348 kg)  09/15/15 202 lb (91.627 kg)  09/01/15 203 lb (92.08 kg)     Other studies reviewed: Additional studies/records reviewed today include: summarized above  DEVICE information: MDT CRT-D, 08/05/11 Dr. Lovena Le, original device explanted Nov 2012 secondary to infection  ASSESSMENT AND PLAN:  1. VT     VT storm 08/22/15     NO DRIVING 6 MONTHS, the patient is aware     On amiodarone, last TSH 08/23/15 6.967, AST 46, ALT 48     VT ablation 09/15/15,  10/18/15 3 episodes of VT on device check  2. NICM/ICD     OptiVol is increased, Lungs are clear, no significant edema is appreciated, no PND or orthopnea, DOE unchanged from his hospital stay in January.     On BB/ACE  3. PAFib     CHA2DS2Vasc is at least 2 on warfarin  4. VHD/mechanical MVR     Coumadin managed with coumadin clinic      Disposition: CBC, TSH, LFTs today, he does not appear volume OL on exam, his optiVol is up some , he will monitor for edema/weights closely.  Suspect at least part of his symptoms since Jan may be secondary to up-titratioin of his amiodarone, and more recently the addition of BB, he reports historically extreme fatigue with BB as well. Hesitate to make any adjustments given his VT.  He is also back in AFlutter, may also contribute, though he points out to me historically in AF he has not felt so poorly.   I will discuss with Dr. Lovena Le further his medicines, possible ischemic evaluation, and results of the labs from today as well, and make POC along with him and call the patient.  Current medicines are reviewed at length with the patient today.  The patient did not have any concerns regarding medicines.   10/14/15: ADDENDUM:  Spoke with Dr. Lovena Le, will decrease his metoprolol to 1/2 tab (12.5mg ) BID to start, no other changes at this time.  Discussed with the patient.  BP at his visit by recall 124/80  Signed, Jennings Books,  PA-C 10/14/2015 10:32 AM     [place] [address] [phone] (office)  [phone] (fax)

## 2015-10-14 ENCOUNTER — Encounter: Payer: Self-pay | Admitting: Physician Assistant

## 2015-10-14 ENCOUNTER — Ambulatory Visit (INDEPENDENT_AMBULATORY_CARE_PROVIDER_SITE_OTHER): Payer: Medicare Other | Admitting: Physician Assistant

## 2015-10-14 VITALS — HR 79 | Ht 74.0 in | Wt 208.0 lb

## 2015-10-14 DIAGNOSIS — I420 Dilated cardiomyopathy: Secondary | ICD-10-CM

## 2015-10-14 DIAGNOSIS — I4892 Unspecified atrial flutter: Secondary | ICD-10-CM

## 2015-10-14 DIAGNOSIS — I5022 Chronic systolic (congestive) heart failure: Secondary | ICD-10-CM

## 2015-10-14 DIAGNOSIS — I472 Ventricular tachycardia, unspecified: Secondary | ICD-10-CM

## 2015-10-14 DIAGNOSIS — I38 Endocarditis, valve unspecified: Secondary | ICD-10-CM | POA: Diagnosis not present

## 2015-10-14 LAB — CUP PACEART INCLINIC DEVICE CHECK
Battery Voltage: 2.98 V
Brady Statistic AS VS Percent: 17.49 %
Brady Statistic RA Percent Paced: 7.2 %
Brady Statistic RV Percent Paced: 82.34 %
Date Time Interrogation Session: 20170321101701
HIGH POWER IMPEDANCE MEASURED VALUE: 68 Ohm
HighPow Impedance: 342 Ohm
Implantable Lead Implant Date: 20130110
Implantable Lead Implant Date: 20130110
Implantable Lead Location: 753860
Implantable Lead Model: 4196
Implantable Lead Model: 5076
Lead Channel Impedance Value: 399 Ohm
Lead Channel Pacing Threshold Amplitude: 0.875 V
Lead Channel Pacing Threshold Pulse Width: 0.4 ms
Lead Channel Sensing Intrinsic Amplitude: 1.375 mV
Lead Channel Sensing Intrinsic Amplitude: 5.125 mV
Lead Channel Setting Pacing Amplitude: 1.5 V
Lead Channel Setting Pacing Amplitude: 4.75 V
Lead Channel Setting Pacing Pulse Width: 1.2 ms
Lead Channel Setting Sensing Sensitivity: 0.3 mV
MDC IDC LEAD IMPLANT DT: 20130110
MDC IDC LEAD LOCATION: 753858
MDC IDC LEAD LOCATION: 753859
MDC IDC LEAD MODEL: 6935
MDC IDC MSMT LEADCHNL LV IMPEDANCE VALUE: 456 Ohm
MDC IDC MSMT LEADCHNL LV IMPEDANCE VALUE: 475 Ohm
MDC IDC MSMT LEADCHNL LV IMPEDANCE VALUE: 817 Ohm
MDC IDC MSMT LEADCHNL LV PACING THRESHOLD AMPLITUDE: 4.25 V
MDC IDC MSMT LEADCHNL LV PACING THRESHOLD PULSEWIDTH: 1.2 ms
MDC IDC MSMT LEADCHNL RA PACING THRESHOLD AMPLITUDE: 0.625 V
MDC IDC MSMT LEADCHNL RA PACING THRESHOLD PULSEWIDTH: 0.4 ms
MDC IDC MSMT LEADCHNL RA SENSING INTR AMPL: 1 mV
MDC IDC MSMT LEADCHNL RV IMPEDANCE VALUE: 418 Ohm
MDC IDC MSMT LEADCHNL RV SENSING INTR AMPL: 10 mV
MDC IDC SET LEADCHNL RV PACING AMPLITUDE: 2.5 V
MDC IDC SET LEADCHNL RV PACING PULSEWIDTH: 0.4 ms
MDC IDC STAT BRADY AP VP PERCENT: 7.03 %
MDC IDC STAT BRADY AP VS PERCENT: 0.17 %
MDC IDC STAT BRADY AS VP PERCENT: 75.31 %

## 2015-10-14 LAB — TSH: TSH: 3.59 m[IU]/L (ref 0.40–4.50)

## 2015-10-14 LAB — CBC
HCT: 45.7 % (ref 39.0–52.0)
HEMOGLOBIN: 14.7 g/dL (ref 13.0–17.0)
MCH: 29.1 pg (ref 26.0–34.0)
MCHC: 32.2 g/dL (ref 30.0–36.0)
MCV: 90.5 fL (ref 78.0–100.0)
MPV: 11 fL (ref 8.6–12.4)
Platelets: 157 10*3/uL (ref 150–400)
RBC: 5.05 MIL/uL (ref 4.22–5.81)
RDW: 15.4 % (ref 11.5–15.5)
WBC: 8.2 10*3/uL (ref 4.0–10.5)

## 2015-10-14 LAB — HEPATIC FUNCTION PANEL
ALBUMIN: 3.9 g/dL (ref 3.6–5.1)
ALK PHOS: 65 U/L (ref 40–115)
ALT: 53 U/L — AB (ref 9–46)
AST: 41 U/L — AB (ref 10–35)
BILIRUBIN TOTAL: 0.7 mg/dL (ref 0.2–1.2)
Bilirubin, Direct: 0.2 mg/dL (ref <=0.2)
Indirect Bilirubin: 0.5 mg/dL (ref 0.2–1.2)
TOTAL PROTEIN: 6.7 g/dL (ref 6.1–8.1)

## 2015-10-14 NOTE — Patient Instructions (Signed)
Medication Instructions:   Your physician recommends that you continue on your current medications as directed. Please refer to the Current Medication list given to you today.   If you need a refill on your cardiac medications before your next appointment, please call your pharmacy.  Labwork:  CBC LFT TSH    Testing/Procedures:  NONE ORDER TODAY    Follow-Up:  4 TO 6 WEEKS WITH DR Lovena Le  Remote monitoring is used to monitor your Pacemaker of ICD from home. This monitoring reduces the number of office visits required to check your device to one time per year. It allows [place] to keep an eye on the functioning of your device to ensure it is working properly. You are scheduled for a device check from home on .01/15/16.. You may send your transmission at any time that day. If you have a wireless device, the transmission will be sent automatically. After your physician reviews your transmission, you will receive a postcard with your next transmission date.     Any Other Special Instructions Will Be Listed Below (If Applicable).

## 2015-10-15 ENCOUNTER — Telehealth: Payer: Self-pay | Admitting: Physician Assistant

## 2015-10-15 NOTE — Telephone Encounter (Signed)
Discussed with the patient POC after talking with Dr. Lovena Le, will decrease his metoprolol to 1/2 tab (12.5mg ) BID, no other changes for now.  Discussed labs with the patient as well, TSH wnl, LFTs, minimally elevated, will follow, CBC wnl.  Will see him back in 1 mo, sooner if needed.  Tommye Standard, PA-C

## 2015-10-19 ENCOUNTER — Inpatient Hospital Stay (HOSPITAL_COMMUNITY)
Admission: EM | Admit: 2015-10-19 | Discharge: 2015-10-22 | DRG: 308 | Disposition: A | Discharge status: Home / Self Care | Payer: Medicare Other | Attending: Cardiovascular Disease | Admitting: Cardiovascular Disease

## 2015-10-19 ENCOUNTER — Encounter (HOSPITAL_COMMUNITY): Payer: Self-pay | Admitting: Emergency Medicine

## 2015-10-19 ENCOUNTER — Emergency Department (HOSPITAL_COMMUNITY): Payer: Medicare Other

## 2015-10-19 DIAGNOSIS — Z952 Presence of prosthetic heart valve: Secondary | ICD-10-CM

## 2015-10-19 DIAGNOSIS — I481 Persistent atrial fibrillation: Secondary | ICD-10-CM | POA: Diagnosis present

## 2015-10-19 DIAGNOSIS — I4892 Unspecified atrial flutter: Secondary | ICD-10-CM | POA: Diagnosis present

## 2015-10-19 DIAGNOSIS — Z95810 Presence of automatic (implantable) cardiac defibrillator: Secondary | ICD-10-CM | POA: Diagnosis present

## 2015-10-19 DIAGNOSIS — I480 Paroxysmal atrial fibrillation: Secondary | ICD-10-CM

## 2015-10-19 DIAGNOSIS — I429 Cardiomyopathy, unspecified: Secondary | ICD-10-CM | POA: Diagnosis present

## 2015-10-19 DIAGNOSIS — Z8249 Family history of ischemic heart disease and other diseases of the circulatory system: Secondary | ICD-10-CM | POA: Diagnosis not present

## 2015-10-19 DIAGNOSIS — R079 Chest pain, unspecified: Secondary | ICD-10-CM

## 2015-10-19 DIAGNOSIS — H902 Conductive hearing loss, unspecified: Secondary | ICD-10-CM | POA: Diagnosis present

## 2015-10-19 DIAGNOSIS — G4730 Sleep apnea, unspecified: Secondary | ICD-10-CM | POA: Diagnosis present

## 2015-10-19 DIAGNOSIS — E039 Hypothyroidism, unspecified: Secondary | ICD-10-CM | POA: Diagnosis present

## 2015-10-19 DIAGNOSIS — Z7901 Long term (current) use of anticoagulants: Secondary | ICD-10-CM | POA: Diagnosis not present

## 2015-10-19 DIAGNOSIS — R7989 Other specified abnormal findings of blood chemistry: Secondary | ICD-10-CM

## 2015-10-19 DIAGNOSIS — Z954 Presence of other heart-valve replacement: Secondary | ICD-10-CM | POA: Diagnosis not present

## 2015-10-19 DIAGNOSIS — R778 Other specified abnormalities of plasma proteins: Secondary | ICD-10-CM

## 2015-10-19 DIAGNOSIS — I5023 Acute on chronic systolic (congestive) heart failure: Secondary | ICD-10-CM | POA: Diagnosis present

## 2015-10-19 DIAGNOSIS — I4891 Unspecified atrial fibrillation: Secondary | ICD-10-CM | POA: Diagnosis present

## 2015-10-19 DIAGNOSIS — I472 Ventricular tachycardia: Principal | ICD-10-CM

## 2015-10-19 DIAGNOSIS — I471 Supraventricular tachycardia: Secondary | ICD-10-CM | POA: Diagnosis present

## 2015-10-19 DIAGNOSIS — I482 Chronic atrial fibrillation, unspecified: Secondary | ICD-10-CM | POA: Diagnosis present

## 2015-10-19 DIAGNOSIS — R0602 Shortness of breath: Secondary | ICD-10-CM | POA: Diagnosis present

## 2015-10-19 LAB — BASIC METABOLIC PANEL
ANION GAP: 10 (ref 5–15)
BUN: 35 mg/dL — ABNORMAL HIGH (ref 6–20)
CHLORIDE: 108 mmol/L (ref 101–111)
CO2: 20 mmol/L — ABNORMAL LOW (ref 22–32)
CREATININE: 1.46 mg/dL — AB (ref 0.61–1.24)
Calcium: 9.2 mg/dL (ref 8.9–10.3)
GFR calc non Af Amer: 46 mL/min — ABNORMAL LOW (ref >60)
GFR, EST AFRICAN AMERICAN: 54 mL/min — AB (ref >60)
Glucose, Bld: 141 mg/dL — ABNORMAL HIGH (ref 65–99)
POTASSIUM: 4.7 mmol/L (ref 3.5–5.1)
SODIUM: 138 mmol/L (ref 135–145)

## 2015-10-19 LAB — CBC
HCT: 48.1 % (ref 39.0–52.0)
Hemoglobin: 15.5 g/dL (ref 13.0–17.0)
MCH: 29.6 pg (ref 26.0–34.0)
MCHC: 32.2 g/dL (ref 30.0–36.0)
MCV: 92 fL (ref 78.0–100.0)
PLATELETS: 155 10*3/uL (ref 150–400)
RBC: 5.23 MIL/uL (ref 4.22–5.81)
RDW: 16.9 % — ABNORMAL HIGH (ref 11.5–15.5)
WBC: 8.8 10*3/uL (ref 4.0–10.5)

## 2015-10-19 LAB — PROTIME-INR
INR: 3.22 — AB (ref 0.00–1.49)
Prothrombin Time: 32.3 seconds — ABNORMAL HIGH (ref 11.6–15.2)

## 2015-10-19 LAB — TROPONIN I: Troponin I: 0.05 ng/mL — ABNORMAL HIGH (ref <0.031)

## 2015-10-19 MED ORDER — WARFARIN SODIUM 5 MG PO TABS
5.0000 mg | ORAL_TABLET | Freq: Once | ORAL | Status: AC
  Administered 2015-10-19: 5 mg via ORAL
  Filled 2015-10-19 (×2): qty 1

## 2015-10-19 MED ORDER — ONDANSETRON HCL 4 MG/2ML IJ SOLN: 4.0000 mg | Freq: Four times a day (QID) | INTRAMUSCULAR | Status: DC | PRN

## 2015-10-19 MED ORDER — LEVOTHYROXINE SODIUM 75 MCG PO TABS: 75.0000 ug | ORAL_TABLET | Freq: Every day | ORAL | Status: DC

## 2015-10-19 MED ORDER — WARFARIN - PHARMACIST DOSING INPATIENT
Freq: Every day | Status: DC
  Administered 2015-10-19 – 2015-10-20 (×2)

## 2015-10-19 MED ORDER — ZOLPIDEM TARTRATE 5 MG PO TABS
5.0000 mg | ORAL_TABLET | Freq: Every evening | ORAL | Status: DC | PRN
  Administered 2015-10-20: 5 mg via ORAL
  Filled 2015-10-19 (×2): qty 1

## 2015-10-19 MED ORDER — ACETAMINOPHEN 325 MG PO TABS: 650.0000 mg | ORAL_TABLET | ORAL | Status: DC | PRN

## 2015-10-19 MED ORDER — LEVOTHYROXINE SODIUM 75 MCG PO TABS
75.0000 ug | ORAL_TABLET | Freq: Every day | ORAL | Status: DC
  Administered 2015-10-20 – 2015-10-22 (×3): 75 ug via ORAL
  Filled 2015-10-19 (×3): qty 1

## 2015-10-19 MED ORDER — DILTIAZEM HCL 100 MG IV SOLR
5.0000 mg/h | INTRAVENOUS | Status: DC
  Administered 2015-10-19: 5 mg/h via INTRAVENOUS
  Filled 2015-10-19: qty 100

## 2015-10-19 MED ORDER — AMIODARONE HCL 200 MG PO TABS
400.0000 mg | ORAL_TABLET | Freq: Every day | ORAL | Status: DC
  Administered 2015-10-20 – 2015-10-22 (×3): 400 mg via ORAL
  Filled 2015-10-19 (×3): qty 2

## 2015-10-19 MED ORDER — METOPROLOL TARTRATE 1 MG/ML IV SOLN
5.0000 mg | Freq: Once | INTRAVENOUS | Status: AC
  Administered 2015-10-19: 5 mg via INTRAVENOUS
  Filled 2015-10-19: qty 5

## 2015-10-19 MED ORDER — ACETAMINOPHEN 500 MG PO TABS: 1000.0000 mg | ORAL_TABLET | Freq: Four times a day (QID) | ORAL | Status: DC | PRN

## 2015-10-19 MED ORDER — METOPROLOL TARTRATE 12.5 MG HALF TABLET
12.5000 mg | ORAL_TABLET | Freq: Two times a day (BID) | ORAL | Status: DC
  Administered 2015-10-19 – 2015-10-22 (×6): 12.5 mg via ORAL
  Filled 2015-10-19 (×6): qty 1

## 2015-10-19 NOTE — ED Provider Notes (Signed)
CSN: [id number]     Arrival date & time 10/19/15  1539 History   First MD Initiated Contact with Patient 10/19/15 1607     Chief Complaint  Patient presents with  . Tachycardia     (Consider location/radiation/quality/duration/timing/severity/associated sxs/prior Treatment) HPI Patient presents with concern of ongoing generalized discomfort, chest pain. This was well until awakening today, about 8 hours ago. Initially patient only had mild generalized illness, but about 5 hours ago developed chest tightness, mild increased fatigue. He denies dyspnea, syncope, nausea. No recent illness, no recent chills, fever, nausea, vomiting. Patient has notable history of cardiac ablation 1 month ago, mitral valve replacement, and implanted defibrillator/pacemaker. Patient's antihypertensive medications were changed 3 weeks ago, otherwise no recent medication changes. Since onset no clear alleviating or exacerbating factors.   Past Medical History  Diagnosis Date  . Nonischemic cardiomyopathy ([place])   . 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 Psychosexual dysfunction with inhibited sexual excitement   . Conductive hearing loss   . Syncope and collapse   . Atrial fibrillation ([place])   . Intraspinal abscess   . Mitral valve insufficiency and aortic valve insufficiency     s/p MVR mechanical  . S/P mitral valve replacement   . Unspecified sleep apnea     last sleep study 11/07  . Ventricular tachycardia [facility])    Past Surgical History  Procedure Laterality Date  . Mitral valve replacement      w #[address]  . Valve replacement  2000  . Evacution of epidural lumbar epidural abscess  1999  . Thyroidectomy    . Tonsillectomy    . Dental implants    . Cardiac catheterization  04/24/2002  . Insert / replace / remove pacemaker  11/2008  . Cardioversion  03/09/2012    Procedure: CARDIOVERSION;  Surgeon: Evans Lance, MD;  Location: [facility];  Service: Cardiovascular;  Laterality: N/A;  . Cardioversion N/A 11/22/2013    Procedure: CARDIOVERSION;  Surgeon: Sanda Klein, MD;  Location: [place] [facility];  Service: Cardiovascular;  Laterality: N/A;  . Permanent pacemaker insertion N/A 08/05/2011    Procedure: PERMANENT PACEMAKER INSERTION;  Surgeon: Evans Lance, MD;  Location: [facility] CATH LAB;  Service: Cardiovascular;  Laterality: N/A;  . Electrophysiologic study N/A 09/15/2015    Procedure: Stephanie Coup Ablation;  Surgeon: Evans Lance, MD;  Location: [place] CV LAB;  Service: Cardiovascular;  Laterality: N/A;   Family History  Problem Relation Age of Onset  . Heart disease Mother   . Heart failure Mother   . Heart disease Father   . Heart failure Father    Social History  Substance Use Topics  . Smoking status: Never Smoker   . Smokeless tobacco: None  . Alcohol Use: Yes     Comment: occasionally    Review of Systems  Constitutional:       Per HPI, otherwise negative  HENT:       Per HPI, otherwise negative  Respiratory:       Per HPI, otherwise negative  Cardiovascular:       Per HPI, otherwise negative  Gastrointestinal: Negative for vomiting.  Endocrine:       Negative aside from HPI  Genitourinary:       Neg aside from HPI   Musculoskeletal:       Per HPI, otherwise negative  Skin: Negative.   Neurological: Positive for weakness. Negative for  syncope.      Allergies  Review of patient's allergies indicates no known allergies.  Home Medications   Prior to Admission medications   Medication Sig Start Date End Date Taking? Authorizing Provider  acetaminophen (TYLENOL) 500 MG tablet Take 1,000 mg by mouth every 6 (six) hours as needed (pain).   Yes Historical Provider, MD  amiodarone (PACERONE) 200 MG tablet Take 2 tablets (400 mg total) by mouth daily. Patient taking differently: Take 200 mg by mouth 2 (two) times daily.  08/25/15  Yes Renee Dyane Dustman, PA-C  folic acid  (FOLVITE) 1 MG tablet Take 1 mg by mouth at bedtime.    Yes Historical Provider, MD  levothyroxine (SYNTHROID, LEVOTHROID) 75 MCG tablet Take 1 tablet (75 mcg total) by mouth daily. Patient taking differently: Take 75 mcg by mouth daily before breakfast.  11/18/14  Yes Evans Lance, MD  lisinopril (PRINIVIL,ZESTRIL) 10 MG tablet Take 10 mg by mouth at bedtime.  05/17/15  Yes Historical Provider, MD  metoprolol tartrate (LOPRESSOR) 25 MG tablet Take 1 tablet (25 mg total) by mouth 2 (two) times daily. Patient taking differently: Take 12.5 mg by mouth 2 (two) times daily.  09/24/15  Yes Evans Lance, MD  Multiple Vitamin (MULTIVITAMIN WITH MINERALS) TABS tablet Take 1 tablet by mouth daily.   Yes Historical Provider, MD  Omega-3 Fatty Acids (FISH OIL) 1000 MG CAPS Take 1,000 mg by mouth at bedtime.    Yes Historical Provider, MD  warfarin (COUMADIN) 5 MG tablet Take 2.5-5 mg by mouth at bedtime. Take 1/2 tablet (2.5 mg) by mouth on Tuesday and Thursday, take 1 tablet (5 mg) on Sunday, Monday, Wednesday, Friday, Saturday   Yes Historical Provider, MD   BP 116/94 mmHg  Pulse 128  Temp(Src) 97.4 F (36.3 C) (Oral)  Resp 18  Ht 6\' 2"  (1.88 m)  Wt 204 lb (92.534 kg)  BMI 26.18 kg/m2  SpO2 98% Physical Exam  Constitutional: He is oriented to person, place, and time. He appears well-developed. No distress.  HENT:  Head: Normocephalic and atraumatic.  Eyes: Conjunctivae and EOM are normal.  Cardiovascular: Regular rhythm.  Tachycardia present.   Murmur heard. Pulmonary/Chest: Effort normal. No stridor. No respiratory distress.    Abdominal: He exhibits no distension.  Musculoskeletal: He exhibits no edema.  Neurological: He is alert and oriented to person, place, and time. Coordination abnormal.  Skin: Skin is warm and dry.  Psychiatric: He has a normal mood and affect.  Nursing note and vitals reviewed.   ED Course  Procedures (including critical care time) Labs Review Labs Reviewed    BASIC METABOLIC PANEL - Abnormal; Notable for the following:    CO2 20 (*)    Glucose, Bld 141 (*)    BUN 35 (*)    Creatinine, Ser 1.46 (*)    GFR calc non Af Amer 46 (*)    GFR calc Af Amer 54 (*)    All other components within normal limits  CBC - Abnormal; Notable for the following:    RDW 16.9 (*)    All other components within normal limits  TROPONIN I - Abnormal; Notable for the following:    Troponin I 0.05 (*)    All other components within normal limits  PROTIME-INR - Abnormal; Notable for the following:    Prothrombin Time 32.3 (*)    INR 3.22 (*)    All other components within normal limits  BASIC METABOLIC PANEL  PROTIME-INR    Imaging  Review Dg Chest 2 View  10/19/2015  CLINICAL DATA:  Tachycardia, shortness of breath, left chest pain EXAM: CHEST  2 VIEW COMPARISON:  08/22/2015 FINDINGS: Lungs are clear.  No pleural effusion or pneumothorax. Heart is top-normal in size. Prostatic valve. Right subclavian pacemaker. Mild degenerative changes of the visualized thoracolumbar spine. Median sternotomy. IMPRESSION: No evidence of acute cardiopulmonary disease. Electronically Signed   By: Julian Hy M.D.   On: 10/19/2015 16:19   I have personally reviewed and evaluated these images and lab results as part of my medical decision-making.   EKG Interpretation   Date/Time:  Sunday October 19 2015 15:48:35 EDT Ventricular Rate:  130 PR Interval:    QRS Duration: 172 QT Interval:  408 QTC Calculation: 600 R Axis:   -45 Text Interpretation:  Wide QRS tachycardia with occasional Premature  ventricular complexes and Fusion complexes ?AF 2:1 with IVCD Non-specific  intra-ventricular conduction block Inferior infarct , age undetermined  Abnormal ECG Confirmed by Jeneen Rinks  MD, [place] ([id number]) on 10/19/2015 3:54:03 PM     Patiently notably tachycardic on arrival, heart rate 115/135.  We obtained interrogation device for his pacemaker, and this is being performed.  I discussed  the interrogation findings with the representative from the pacemaker company. Patient has episodes of atrial tachycardia, without clear rhythm him a a flutter, A. tach, A. fib.   On repeat exam the patient remains tachycardic. Patient will receive metoprolol for persistent symptoms, with ongoing arrhythmia.  On repeat exam the patient remains tachycardic, after initial metoprolol. Blood pressure similar. I discussed this case with our cardiology colleagues given the elevated troponin, persistent tachycardia, the patient's history of arrhythmia.  MDM  Patient presents with concern of chest pain, tachycardia. Patient has a notable history of coronary disease, as well as valve repair, recent ablation and implanted cardiac defibrillator, pacemaker. Here the patient is awake and alert, but recently tachycardic, in spite of beta blocker medication. Patient's pacemaker was interrogated via telemetry device, and I discussed the findings with the representative from the company.  Patient has elevated troponin, possibly due to  metabolic negative given the persistent tachycardia, but with the patient's history, I discussed his case with our cardiology colleagues for admission, management. Patient is already on Coumadin, with therapeutic level INR.   CRITICAL CARE Performed by: Carmin Muskrat Total critical care time: 35 minutes Critical care time was exclusive of separately billable procedures and treating other patients. Critical care was necessary to treat or prevent imminent or life-threatening deterioration. Critical care was time spent personally by me on the following activities: development of treatment plan with patient and/or surrogate as well as nursing, discussions with consultants, evaluation of patient's response to treatment, examination of patient, obtaining history from patient or surrogate, ordering and performing treatments and interventions, ordering and review of laboratory  studies, ordering and review of radiographic studies, pulse oximetry and re-evaluation of patient's condition.   Carmin Muskrat, MD 10/19/15 2152

## 2015-10-19 NOTE — H&P (Signed)
Patient ID: Austin [name] MRN: [id number] DOB/AGE: 03-Jul-1941 75 y.o.  Admit date: 10/19/2015 Primary Physician  Mathews Argyle, MD  Primary Cardiologist   [name], MD Chief Complaint  Chest discomfort   HPI: 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  [name] underwent VT ablation with Dr. Lovena Le in February due to recurrent ventricular tachycardia on amiodarone therapy.  He followed up with Dr. Lovena Le on 3/20, at which time he was noted to be back in atrial fibrillation/flutter.  He endorses fatigue on metoprolol so it was reduced to 12.5mg  bid.  Since then he has done well until he awakened this AM feeling nauseous.  He also noted fatigue and shortness of breath.  He checked his BP and heart rate and noted that his heart rate was in the 130s.  He denies chest pain but did note some mild L sided chest pressure.  He has not noted LE edema, orthopnea or PND.    In the ED [name] was noted to be in a slightly irregular, wide-complex tachycardia.  His ICD was interrogated and he was found to be in a supraventricular tachycardia.  It was unclear whether it was atrial tachycardia or atrial fib/flutter.  He received metoprolol 5mg  IV without significant improvement and cardiology was called for admission.    Review of Systems:  A 12 point ROS was obtained and was negative with pertinent positives noted in the HPI.   Past Medical History  Diagnosis Date  . Nonischemic cardiomyopathy ([place])   . 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 Psychosexual dysfunction with inhibited sexual excitement   . Conductive hearing loss   . Syncope and collapse   . Atrial fibrillation ([place])   .  Intraspinal abscess   . Mitral valve insufficiency and aortic valve insufficiency     s/p MVR mechanical  . S/P mitral valve replacement   . Unspecified sleep apnea     last sleep study 11/07  . Ventricular tachycardia ([place])      (Not in a hospital admission)   No Known Allergies  Social History   Social History  . Marital Status: Married    Spouse Name: N/A  . Number of Children: N/A  . Years of Education: N/A   Occupational History  . Not on file.   Social History Main Topics  . Smoking status: Never Smoker   . Smokeless tobacco: Not on file  . Alcohol Use: Yes     Comment: occasionally  . Drug Use: No  . Sexual Activity: Not on file   Other Topics Concern  . Not on file   Social History Narrative    Family History  Problem Relation Age of Onset  . Heart disease Mother   . Heart failure Mother   . Heart disease Father   . Heart failure Father     PHYSICAL EXAM: Filed Vitals:   10/19/15 1549 10/19/15 1629  BP: 98/87 116/94  Pulse: 135 128  Temp: 97.4 F (36.3 C)   Resp: 20 18   General:  Well appearing. No respiratory difficulty HEENT: normal Neck: supple. no JVD. Carotids 2+ bilat; no bruits. No lymphadenopathy or thryomegaly appreciated. Cor: PMI nondisplaced. Tachycardic, regular rhythm.  No rubs, gallops or  murmurs. Lungs: clear to auscultation bilaterally.  Abdomen: soft, nontender, nondistended. No hepatosplenomegaly. No bruits or masses. Good bowel sounds. Extremities: no cyanosis, clubbing, rash, edema Neuro: alert & oriented x 3, cranial nerves grossly intact. moves all 4 extremities w/o difficulty. Affect pleasant.   Results for orders placed or performed during the hospital encounter of 10/19/15 (from the past 24 hour(s))  Basic metabolic panel     Status: Abnormal   Collection Time: 10/19/15  4:17 PM  Result Value Ref Range   Sodium 138 135 - 145 mmol/L   Potassium 4.7 3.5 - 5.1 mmol/L   Chloride 108 101 - 111 mmol/L   CO2 20 (L)  22 - 32 mmol/L   Glucose, Bld 141 (H) 65 - 99 mg/dL   BUN 35 (H) 6 - 20 mg/dL   Creatinine, Ser 1.46 (H) 0.61 - 1.24 mg/dL   Calcium 9.2 8.9 - 10.3 mg/dL   GFR calc non Af Amer 46 (L) >60 mL/min   GFR calc Af Amer 54 (L) >60 mL/min   Anion gap 10 5 - 15  CBC     Status: Abnormal   Collection Time: 10/19/15  4:17 PM  Result Value Ref Range   WBC 8.8 4.0 - 10.5 K/uL   RBC 5.23 4.22 - 5.81 MIL/uL   Hemoglobin 15.5 13.0 - 17.0 g/dL   HCT 48.1 39.0 - 52.0 %   MCV 92.0 78.0 - 100.0 fL   MCH 29.6 26.0 - 34.0 pg   MCHC 32.2 30.0 - 36.0 g/dL   RDW 16.9 (H) 11.5 - 15.5 %   Platelets 155 150 - 400 K/uL  Troponin I     Status: Abnormal   Collection Time: 10/19/15  4:45 PM  Result Value Ref Range   Troponin I 0.05 (H) <0.031 ng/mL  Protime-INR     Status: Abnormal   Collection Time: 10/19/15  4:45 PM  Result Value Ref Range   Prothrombin Time 32.3 (H) 11.6 - 15.2 seconds   INR 3.22 (H) 0.00 - 1.49   Dg Chest 2 View  10/19/2015  CLINICAL DATA:  Tachycardia, shortness of breath, left chest pain EXAM: CHEST  2 VIEW COMPARISON:  08/22/2015 FINDINGS: Lungs are clear.  No pleural effusion or pneumothorax. Heart is top-normal in size. Prostatic valve. Right subclavian pacemaker. Mild degenerative changes of the visualized thoracolumbar spine. Median sternotomy. IMPRESSION: No evidence of acute cardiopulmonary disease. Electronically Signed   By: Julian Hy M.D.   On: 10/19/2015 16:19     ECG: Mostly regular, wide-complex tachycardia with occasional PVCs.     ASSESSMENT/PLAN:  # Atrial arrhythmia: Based on his device interrogation [name] has recurrent atrial arrhythmia.  It is mostly regularlized with what appears to be PVCs on EKG.  This could be either atrial tachycardia or atrial flutter.  He has not tolerated higher doses of metoprolol as an outpatient.  We will start a diltiazem infusion.  Continue metoprolol 12.5mg  bid and amiodarone 400mg  daily.  EP to see tomorrow.  He remains  on warfarin for anticoagulation.    # Chronic systolic and diastolic HF: LVEF [id number] in January.  He is compensated at this time.  Continue metoprolol.  We will hold his home lisinopril while on the diltiazem infusion.  Ideally he won't be on diltiazem long-term given his heart failure.  # VT: No recurrent episodes since ablation.  Continue metoprolol and amiodarone.  # Hypothyroidism:  Continue synthroid.  TSH wnl 10/14/15.  # Mechanical mitral valve: Stable.  Coumadin for anticoagulation.   Signed: Halli Equihua C. Oval Linsey, MD, [facility]  10/19/2015, 5:41 PM

## 2015-10-19 NOTE — ED Notes (Signed)
Pt c/o fast heart rate onset this morning. His rates has been 116-124. Pt reports nausea and shortness of breath. Pt heart rate in triage is 135.

## 2015-10-19 NOTE — ED Notes (Signed)
Lab aware of add on labs

## 2015-10-19 NOTE — Progress Notes (Signed)
ANTICOAGULATION CONSULT NOTE - Initial Consult  Pharmacy Consult for Coumadin  Indication: h/o  Atrial Fibrilation and mechanical MVR  No Known Allergies  Patient Measurements: Height: 6\' 2"  (188 cm) Weight: 204 lb (92.534 kg) IBW/kg (Calculated) : 82.2 Heparin Dosing Weight:  Vital Signs: Temp: 97.4 F (36.3 C) (03/26 1549) Temp Source: Oral (03/26 1549) BP: 106/81 mmHg (03/26 1900) Pulse Rate: 124 (03/26 1900)  Labs:  Recent Labs  10/19/15 1617 10/19/15 1645  HGB 15.5  --   HCT 48.1  --   PLT 155  --   LABPROT  --  32.3*  INR  --  3.22*  CREATININE 1.46*  --   TROPONINI  --  0.05*    Estimated Creatinine Clearance: 53.2 mL/min (by C-G formula based on Cr of 1.46).   Medical History: Past Medical History  Diagnosis Date  . Nonischemic cardiomyopathy ([place])   . 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 Psychosexual dysfunction with inhibited sexual excitement   . Conductive hearing loss   . Syncope and collapse   . Atrial fibrillation ([place])   . Intraspinal abscess   . Mitral valve insufficiency and aortic valve insufficiency     s/p MVR mechanical  . S/P mitral valve replacement   . Unspecified sleep apnea     last sleep study 11/07  . Ventricular tachycardia ([facility])     Medications:  Scheduled:  . amiodarone  400 mg Oral Daily  . [START ON 10/20/2015] levothyroxine  75 mcg Oral QAC breakfast  . metoprolol tartrate  12.5 mg Oral BID    Assessment: 75 y.o male who presents to the ED with tachycardia. He is on chronic coumadin PTA for h/o PAF and mechanical MVR.  INR on admit is 3.22, therapeutic. The INR goal is 3.0-3.5 as noted by anti-coag office visit note on 10/08/15.  No bleeding reported.   PTA coumadin dose: 5mg  daily except 2.5 mg every Tuesday and Thursday. Last taken on yesterday 10/18/15  Goal of Therapy:  INR goal = 3.0-3.5 per anti-coag clinic visit note.   Monitor platelets by anticoagulation protocol: Yes   Plan:  Coumadin 5mg  tonight (per usual home dosage) Daily INR   Nicole Cella, RPh Clinical Pharmacist Pager: [phone] 10/19/2015,7:28 PM

## 2015-10-20 LAB — BASIC METABOLIC PANEL
ANION GAP: 8 (ref 5–15)
BUN: 34 mg/dL — ABNORMAL HIGH (ref 6–20)
CHLORIDE: 108 mmol/L (ref 101–111)
CO2: 21 mmol/L — AB (ref 22–32)
Calcium: 8.9 mg/dL (ref 8.9–10.3)
Creatinine, Ser: 1.33 mg/dL — ABNORMAL HIGH (ref 0.61–1.24)
GFR calc non Af Amer: 52 mL/min — ABNORMAL LOW (ref >60)
Glucose, Bld: 116 mg/dL — ABNORMAL HIGH (ref 65–99)
Potassium: 4.4 mmol/L (ref 3.5–5.1)
Sodium: 137 mmol/L (ref 135–145)

## 2015-10-20 LAB — PROTIME-INR
INR: 3.48 — AB (ref 0.00–1.49)
Prothrombin Time: 34.2 seconds — ABNORMAL HIGH (ref 11.6–15.2)

## 2015-10-20 LAB — MAGNESIUM: MAGNESIUM: 2.2 mg/dL (ref 1.7–2.4)

## 2015-10-20 MED ORDER — WARFARIN SODIUM 5 MG PO TABS
5.0000 mg | ORAL_TABLET | Freq: Once | ORAL | Status: AC
  Administered 2015-10-20: 5 mg via ORAL
  Filled 2015-10-20: qty 1

## 2015-10-20 MED ORDER — MEXILETINE HCL 150 MG PO CAPS
150.0000 mg | ORAL_CAPSULE | Freq: Three times a day (TID) | ORAL | Status: DC
  Administered 2015-10-20 – 2015-10-22 (×6): 150 mg via ORAL
  Filled 2015-10-20 (×8): qty 1

## 2015-10-20 NOTE — Consult Note (Signed)
ELECTROPHYSIOLOGY CONSULT NOTE    Patient ID: Austin [name] MRN: [id number], DOB/AGE: 1941/04/22 75 y.o.  Admit date: 10/19/2015 Date of Consult: 10/20/2015  Primary Physician: Mathews Argyle, MD Primary Cardiologist: Dr. Tamala Julian Electrophysiologist: Dr. Lovena Le  Reason for Consultation: tachycardia  HPI: Austin [name] is a 75 y.o. male with history of NICM/CRT-D, chronic CHF VHD with MVR (mechanical), persistent AFib and VT. His EPS disclosed 4 different VTs, 2 were succesfuly ablated and felt to be his clinical VT. He had a device transmission noting he was back in AFib/flutter and Dr. Lovena Le started metoprolol 25mg  BID.  He was last seen in the office by myself on 10/13/15 noting since his VT storm and hospital stay in January he had been feeling poorly. Prior to that he was playing tennis regularly and doing well, since that time, he was feeling tired, no energy, napping even. He has some DOE but no rest SOB, no symptoms of PND or orthopnea. His home/morning weights generally 202-205, today was 205 at home. He has waxing/waning ankle edema over the last few months. He mentions sporadic CP as well, usually when up/around, tight across his chest or upper abdomen, it can last hours in duration and when he has it, is unchanged with position or exertion. No dizzy spells, near syncope or syncope, no shocks from his device. Since being started on the metoprolol especially he feels like his energy is worse though his palpitations for the most part are resolved.  After discussion with Dr. Lovena Le we decreased his metoprolol in half.  Now he comes in admitted to [facility] with c/o feeling nauseous. He also noted fatigue and shortness of breath. He checked his BP and heart rate and noted that his heart rate was in the 130s. He was found to be in a WCT irregular and by the admit note, his device was interrogated and felt to be an SVT was given IV metoprolol and started on diltiazem gtt.  The  patient states that on the lower BB dose after a day or tow really felt better, his energy was better and had 3 great days, then woke feeling sick to his stomach, tired, weak, and with some left sided chest discomfort at the day went.  No near syncope or syncope.  This morning after his ICD shock he feels like he generally is better.  No ongoing complaints.  No SOB  His EKG reviewed thismorning by Dr. Lovena Le, felt more likely to be slow VT and his diltiazem gtt stopped with plans to try and pace terminate the arrhythmia, though the [atient has subsequently gotten shocked by his ICD and his rhytm is now V paced  Trop 0.05, K+ 4.7  Past Medical History  Diagnosis Date  . Nonischemic cardiomyopathy ([place])   . 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 Psychosexual dysfunction with inhibited sexual excitement   . Conductive hearing loss   . Syncope and collapse   . Atrial fibrillation ([place])   . Intraspinal abscess   . Mitral valve insufficiency and aortic valve insufficiency     s/p MVR mechanical  . S/P mitral valve replacement   . Unspecified sleep apnea     last sleep study 11/07  . Ventricular tachycardia [facility])      Surgical History:  Past Surgical History  Procedure Laterality Date  . Mitral valve replacement      w #[address] [facility]  . Valve  replacement  2000  . Evacution of epidural lumbar epidural abscess  1999  . Thyroidectomy    . Tonsillectomy    . Dental implants    . Cardiac catheterization  04/24/2002  . Insert / replace / remove pacemaker  11/2008  . Cardioversion  03/09/2012    Procedure: CARDIOVERSION;  Surgeon: Evans Lance, MD;  Location: [place];  Service: Cardiovascular;  Laterality: N/A;  . Cardioversion N/A 11/22/2013    Procedure: CARDIOVERSION;  Surgeon: Sanda Klein, MD;  Location: [facility] ENDOSCOPY;  Service: Cardiovascular;  Laterality: N/A;  . Permanent pacemaker insertion N/A  08/05/2011    Procedure: PERMANENT PACEMAKER INSERTION;  Surgeon: Evans Lance, MD;  [facility] CATH LAB;  Service: Cardiovascular;  Laterality: N/A;  . Electrophysiologic study N/A 09/15/2015    Procedure: Stephanie Coup Ablation;  Surgeon: Evans Lance, MD;  Location: [place] CV LAB;  Service: Cardiovascular;  Laterality: N/A;     Prescriptions prior to admission  Medication Sig Dispense Refill Last Dose  . acetaminophen (TYLENOL) 500 MG tablet Take 1,000 mg by mouth every 6 (six) hours as needed (pain).   week ago  . amiodarone (PACERONE) 200 MG tablet Take 2 tablets (400 mg total) by mouth daily. (Patient taking differently: Take 200 mg by mouth 2 (two) times daily. ) 60 tablet 3 10/19/2015 at 800  . folic acid (FOLVITE) 1 MG tablet Take 1 mg by mouth at bedtime.    10/18/2015 at Unknown time  . levothyroxine (SYNTHROID, LEVOTHROID) 75 MCG tablet Take 1 tablet (75 mcg total) by mouth daily. (Patient taking differently: Take 75 mcg by mouth daily before breakfast. ) 30 tablet 6 10/19/2015 at Unknown time  . lisinopril (PRINIVIL,ZESTRIL) 10 MG tablet Take 10 mg by mouth at bedtime.   9 10/18/2015 at Unknown time  . metoprolol tartrate (LOPRESSOR) 25 MG tablet Take 1 tablet (25 mg total) by mouth 2 (two) times daily. (Patient taking differently: Take 12.5 mg by mouth 2 (two) times daily. ) 180 tablet 3 10/19/2015 at noon  . Multiple Vitamin (MULTIVITAMIN WITH MINERALS) TABS tablet Take 1 tablet by mouth daily.   10/19/2015 at Unknown time  . Omega-3 Fatty Acids (FISH OIL) 1000 MG CAPS Take 1,000 mg by mouth at bedtime.    10/18/2015 at Unknown time  . warfarin (COUMADIN) 5 MG tablet Take 2.5-5 mg by mouth at bedtime. Take 1/2 tablet (2.5 mg) by mouth on Tuesday and Thursday, take 1 tablet (5 mg) on Sunday, Monday, Wednesday, Friday, Saturday   10/18/2015 at Unknown time    Inpatient Medications:  . amiodarone  400 mg Oral Daily  . levothyroxine  75 mcg Oral QAC breakfast  . metoprolol tartrate  12.5  mg Oral BID  . warfarin  5 mg Oral ONCE-1800  . Warfarin - Pharmacist Dosing Inpatient   Does not apply q1800    Allergies: No Known Allergies  Social History   Social History  . Marital Status: Married    Spouse Name: N/A  . Number of Children: N/A  . Years of Education: N/A   Occupational History  . Not on file.   Social History Main Topics  . Smoking status: Never Smoker   . Smokeless tobacco: Not on file  . Alcohol Use: Yes     Comment: occasionally  . Drug Use: No  . Sexual Activity: Not on file   Other Topics Concern  . Not on file   Social History Narrative     Family History  Problem Relation Age of Onset  . Heart disease Mother   . Heart failure Mother   . Heart disease Father   . Heart failure Father      Review of Systems: All other systems reviewed and are otherwise negative except as noted above.  Physical Exam: Filed Vitals:   10/20/15 0127 10/20/15 0617 10/20/15 0830 10/20/15 1053  BP: 102/78 98/80 93/70  125/87  Pulse: 118 133 130 63  Temp: 98 F (36.7 C) 98 F (36.7 C) 97.4 F (36.3 C) 97.4 F (36.3 C)  TempSrc: Oral Oral Oral Oral  Resp: 20 21    Height:      Weight:  204 lb 4.8 oz (92.67 kg)    SpO2: 95% 96% 95% 95%    GEN- The patient is well appearing, alert and oriented x 3 today.   HEENT: normocephalic, atraumatic; sclera clear, conjunctiva pink; hearing intact; oropharynx clear; neck supple, no JVP Lymph- no cervical lymphadenopathy Lungs- Clear to ausculation bilaterally, normal work of breathing.  No wheezes, rales, rhonchi Heart- Regular rate and rhythm, no significant murmurs, no rubs or gallops, PMI not laterally displaced GI- soft, non-tender, non-distended Extremities- no clubbing, cyanosis, or edema; DP/PT/radial pulses 2+ bilaterally MS- no significant deformity or atrophy Skin- warm and dry, no rash or lesion Psych- euthymic mood, full affect Neuro- no gross deficits observed  Labs:   Lab Results  Component  Value Date   WBC 8.8 10/19/2015   HGB 15.5 10/19/2015   HCT 48.1 10/19/2015   MCV 92.0 10/19/2015   PLT 155 10/19/2015    Recent Labs Lab 10/14/15 1016  10/20/15 0459  NA  --   < > 137  K  --   < > 4.4  CL  --   < > 108  CO2  --   < > 21*  BUN  --   < > 34*  CREATININE  --   < > 1.33*  CALCIUM  --   < > 8.9  PROT 6.7  --   --   BILITOT 0.7  --   --   ALKPHOS 65  --   --   ALT 53*  --   --   AST 41*  --   --   GLUCOSE  --   < > 116*  < > = values in this interval not displayed.    Radiology/Studies:  Dg Chest 2 View 10/19/2015  CLINICAL DATA:  Tachycardia, shortness of breath, left chest pain EXAM: CHEST  2 VIEW COMPARISON:  08/22/2015 FINDINGS: Lungs are clear.  No pleural effusion or pneumothorax. Heart is top-normal in size. Prostatic valve. Right subclavian pacemaker. Mild degenerative changes of the visualized thoracolumbar spine. Median sternotomy. IMPRESSION: No evidence of acute cardiopulmonary disease. Electronically Signed   By: Julian Hy M.D.   On: 10/19/2015 16:19    EKG: WCT 130bp, slightly irregular TELEMETRY: tachycardic 130's >> V paced rhythm 60bpm  08/23/15: Echocardiogram Study Conclusions - Left ventricle: The cavity size was moderately dilated. Wall  thickness was normal. Systolic function was moderately to  severely reduced. The estimated ejection fraction was in the  range of 30% to 35%. Diffuse hypokinesis. The study is not  technically sufficient to allow evaluation of LV diastolic  function. - Aortic valve: Trileaflet. Sclerosis without stenosis. There was  no regurgitation. - Mitral valve: Tilting bileaflet mechanical valve without  obstruction. Normal bileaflet motion. Trivial regurgitation. Mean  gradient (D): 4 mm Hg. Valve area by pressure half-time: 1.85  cm^2. Valve area by continuity equation (using LVOT flow): 1.54  cm^2. - Left atrium: Severely dilated at 58 ml/m2. - Right ventricle: The cavity size was moderately  dilated. Systolic  function is reduced. Lateral annulus peak S velocity: 8.5 cm/s. - Right atrium: Severely dilated at 29 cm2. - Tricuspid valve: There was mild regurgitation. - Pulmonary arteries: PA peak pressure: 32 mm Hg (S) + RAP. - Systemic veins: IVC not visualized. Impressions: - Compared to a prior study in 2012, the EF is lower at 30-35% -  moderate dilation with normal wall thickness and global  hypokinesis. The RV is also dilated with reduced systolic  function. The mechanical mitral valve is functioning normally  without obstruction. There is severe biatrial enlargement.  T 09/15/15: VT ablation, Dr. Lovena Le Conclusion: Successful catheter ablation of 2 distinct ventricular tachycardias originating from the mitral valve annulus which appeared to correlate with the patient's clinical ventricular tachycardia. Unsuccessful ablation of the inducible ventricular tachycardia originating from the septal portion of the mitral valve annulus as previously noted above.   DEVICE HISTORY: MDT CRT-D, 08/05/11 Dr. Lovena Le, original device explanted Nov 2012 secondary to infection  Assessment and Plan:   1. WCT, VT >>> ICD therapy     BP stable   Hx of VT storm 08/22/15  NO DRIVING 6 MONTHS, the patient is aware  On amiodarone, last TSH 08/23/15 6.967, AST 46, ALT 48  VT ablation 09/15/15 Interrogate the device POC pending review of device interrogation and Dr. Lovena Le to see the patient  2. NICM/ICD  appears compensated by exam and cxr  On BB/ACE  3. PAFib  CHA2DS2Vasc is at least 2 on warfarin     INR today is 3.48, management with pharmacy  4. VHD/mechanical MVR  Coumadin managed out patient with coumadin clinic  5. Chest pressure     Not active, one Trop negative     Will plan ischemic work up   Venetia Night, Hershal Coria 10/20/2015 12:03 PM  EP attending  Patient seen and examined. I concur with the findings as noted above. The patient is  admitted with a wide QRS tachycardia which is ventricular tachycardia. Prior to being anti-tachycardic paced, the patient had acceleration of his ventricular tachycardia and was appropriately treated with an ICD discharge, terminating ventricular tachycardia. He remains in atrial fibrillation/flutter. His underlying conduction is minimal in A. fib/flutter. Discussion of the treatment options were carried out with the patient.   1. Recurrent, slow ventricular tachycardia with acceleration to a faster VT which was symptomatic, resulting in appropriate ICD shock 2. Acute on chronic systolic heart failure 3. Persistent atrial fibrillation despite medical therapy 4. ICD with normal left ventricular function  The patient will be monitored in the hospital and started on mexiletine. We will observe him for at least 48 hours. He will continue his amiodarone and metoprolol. Additional treatment recommendations we based on his response to therapy. We discussed catheter ablation although I am not particular inclined to recommend this at the present time as his mechanical mitral valve made the procedure very difficult the last time it was performed. His ICD is otherwise working normally. His heart failure symptoms are much improved in atrial fibrillation with a controlled ventricular response.  [name], M.D.

## 2015-10-20 NOTE — Progress Notes (Signed)
0800 placed a call to Dr.Taylor . PA called back . Pt anxoius regarding his HR 130's  With no symptoms noted . No complaint presented. Pt on cardizem drip at  5% mgs /hr MD will see . Ptb made aware  951 am Dr. Lovena Le and PA seen the pt . Cardizem taken off

## 2015-10-20 NOTE — Discharge Instructions (Addendum)
No driving for 49months     Information on my medicine - Coumadin   (Warfarin)  This medication education was reviewed with me or my healthcare representative as part of my discharge preparation.   Why was Coumadin prescribed for you? Coumadin was prescribed for you because you have a blood clot or a medical condition that can cause an increased risk of forming blood clots. Blood clots can cause serious health problems by blocking the flow of blood to the heart, lung, or brain. Coumadin can prevent harmful blood clots from forming. As a reminder your indication for Coumadin is:   Stroke Prevention Because Of Atrial Fibrillation  What test will check on my response to Coumadin? While on Coumadin (warfarin) you will need to have an INR test regularly to ensure that your dose is keeping you in the desired range. The INR (international normalized ratio) number is calculated from the result of the laboratory test called prothrombin time (PT).  If an INR APPOINTMENT HAS NOT ALREADY BEEN MADE FOR YOU please schedule an appointment to have this lab work done by your health care provider within 7 days. Your INR goal is usually a number between:  2 to 3 or your provider may give you a more narrow range like 2-2.5.  Ask your health care provider during an office visit what your goal INR is.  What  do you need to  know  About  COUMADIN? Take Coumadin (warfarin) exactly as prescribed by your healthcare provider about the same time each day.  DO NOT stop taking without talking to the doctor who prescribed the medication.  Stopping without other blood clot prevention medication to take the place of Coumadin may increase your risk of developing a new clot or stroke.  Get refills before you run out.  What do you do if you miss a dose? If you miss a dose, take it as soon as you remember on the same day then continue your regularly scheduled regimen the next day.  Do not take two doses of Coumadin at the same  time.  Important Safety Information A possible side effect of Coumadin (Warfarin) is an increased risk of bleeding. You should call your healthcare provider right away if you experience any of the following: ? Bleeding from an injury or your nose that does not stop. ? Unusual colored urine (red or dark brown) or unusual colored stools (red or black). ? Unusual bruising for unknown reasons. ? A serious fall or if you hit your head (even if there is no bleeding).  Some foods or medicines interact with Coumadin (warfarin) and might alter your response to warfarin. To help avoid this: ? Eat a balanced diet, maintaining a consistent amount of Vitamin K. ? Notify your provider about major diet changes you plan to make. ? Avoid alcohol or limit your intake to 1 drink for women and 2 drinks for men per day. (1 drink is 5 oz. wine, 12 oz. beer, or 1.5 oz. liquor.)  Make sure that ANY health care provider who prescribes medication for you knows that you are taking Coumadin (warfarin).  Also make sure the healthcare provider who is monitoring your Coumadin knows when you have started a new medication including herbals and non-prescription products.  Coumadin (Warfarin)  Major Drug Interactions  Increased Warfarin Effect Decreased Warfarin Effect  Alcohol (large quantities) Antibiotics (esp. Septra/Bactrim, Flagyl, Cipro) Amiodarone (Cordarone) Aspirin (ASA) Cimetidine (Tagamet) Megestrol (Megace) NSAIDs (ibuprofen, naproxen, etc.) Piroxicam (Feldene) Propafenone (Rythmol SR) Propranolol (  Inderal) Isoniazid (INH) Posaconazole (Noxafil) Barbiturates (Phenobarbital) Carbamazepine (Tegretol) Chlordiazepoxide (Librium) Cholestyramine (Questran) Griseofulvin Oral Contraceptives Rifampin Sucralfate (Carafate) Vitamin K   Coumadin (Warfarin) Major Herbal Interactions  Increased Warfarin Effect Decreased Warfarin Effect  Garlic Ginseng Ginkgo biloba Coenzyme Q10 Green tea St. Johns wort     Coumadin (Warfarin) FOOD Interactions  Eat a consistent number of servings per week of foods HIGH in Vitamin K (1 serving =  cup)  Collards (cooked, or boiled & drained) Kale (cooked, or boiled & drained) Mustard greens (cooked, or boiled & drained) Parsley *serving size only =  cup Spinach (cooked, or boiled & drained) Swiss chard (cooked, or boiled & drained) Turnip greens (cooked, or boiled & drained)  Eat a consistent number of servings per week of foods MEDIUM-HIGH in Vitamin K (1 serving = 1 cup)  Asparagus (cooked, or boiled & drained) Broccoli (cooked, boiled & drained, or raw & chopped) Brussel sprouts (cooked, or boiled & drained) *serving size only =  cup Lettuce, raw (green leaf, endive, romaine) Spinach, raw Turnip greens, raw & chopped   These websites have more information on Coumadin (warfarin):  FailFactory.se; VeganReport.com.au;

## 2015-10-20 NOTE — Progress Notes (Signed)
1051 Noted by CMT that pt was running Vtach in the monitor . Pt evaluated and claimed that pt had defebrillator that just fired once . Pt asymptomatic . Pt kept in bed . Wife in a\ttendance VS Taken and recorded. Placed a call to MD

## 2015-10-20 NOTE — Progress Notes (Addendum)
[facility] for Coumadin  Indication: h/o  Atrial Fibrilation and mechanical MVR  No Known Allergies  Patient Measurements: Height: 6\' 2"  (188 cm) Weight: 204 lb 4.8 oz (92.67 kg) IBW/kg (Calculated) : 82.2 Heparin Dosing Weight:  Vital Signs: Temp: 97.4 F (36.3 C) (03/27 0830) Temp Source: Oral (03/27 0830) BP: 93/70 mmHg (03/27 0830) Pulse Rate: 130 (03/27 0830)  Labs:  Recent Labs  10/19/15 1617 10/19/15 1645 10/20/15 0459  HGB 15.5  --   --   HCT 48.1  --   --   PLT 155  --   --   LABPROT  --  32.3* 34.2*  INR  --  3.22* 3.48*  CREATININE 1.46*  --  1.33*  TROPONINI  --  0.05*  --     Estimated Creatinine Clearance: 58.4 mL/min (by C-G formula based on Cr of 1.33).  Assessment: 75 y.o male who presents to the ED with tachycardia. He is on chronic coumadin PTA for h/o PAF and mechanical MVR.  INR on admit is 3.22, therapeutic. The INR goal is 3.0-3.5 as noted by anti-coag office visit note on 10/08/15.  No bleeding reported.   PTA coumadin dose: 5mg  daily except 2.5 mg every Tuesday and Thursday.  INR therapeutic today  Goal of Therapy:  INR goal = 3.0-3.5 per anti-coag clinic visit note.  Monitor platelets by anticoagulation protocol: Yes   Plan:  Coumadin 5mg  tonight (per usual home dosage) Daily INR  Thank you Anette Guarneri, PharmD [phone] 10/20/2015,9:51 AM

## 2015-10-21 LAB — PROTIME-INR
INR: 3.43 — AB (ref 0.00–1.49)
PROTHROMBIN TIME: 33.8 s — AB (ref 11.6–15.2)

## 2015-10-21 MED ORDER — WARFARIN SODIUM 2.5 MG PO TABS
2.5000 mg | ORAL_TABLET | Freq: Once | ORAL | Status: AC
  Administered 2015-10-21: 2.5 mg via ORAL
  Filled 2015-10-21: qty 1

## 2015-10-21 NOTE — Progress Notes (Signed)
[facility] for Coumadin  Indication: h/o  Atrial Fibrilation and mechanical MVR  No Known Allergies  Patient Measurements: Height: 6\' 2"  (188 cm) Weight: 204 lb 9.6 oz (92.806 kg) (scale c) IBW/kg (Calculated) : 82.2 Heparin Dosing Weight:  Vital Signs: Temp: 98.6 F (37 C) (03/28 0818) Temp Source: Oral (03/28 0818) BP: 134/88 mmHg (03/28 0818) Pulse Rate: 71 (03/28 0818)  Labs:  Recent Labs  10/19/15 1617 10/19/15 1645 10/20/15 0459 10/21/15 0315  HGB 15.5  --   --   --   HCT 48.1  --   --   --   PLT 155  --   --   --   LABPROT  --  32.3* 34.2* 33.8*  INR  --  3.22* 3.48* 3.43*  CREATININE 1.46*  --  1.33*  --   TROPONINI  --  0.05*  --   --     Estimated Creatinine Clearance: 58.4 mL/min (by C-G formula based on Cr of 1.33).  Assessment: 75 y.o male who presents to the ED with tachycardia. He is on chronic coumadin PTA for h/o PAF and mechanical MVR.  INR on admit is 3.22, therapeutic. The INR goal is 3.0-3.5 as noted by anti-coag office visit note on 10/08/15.  No bleeding reported.   PTA coumadin dose: 5mg  daily except 2.5 mg every Tuesday and Thursday.  INR therapeutic today  Goal of Therapy:  INR goal = 3.0-3.5 per anti-coag clinic visit note.  Monitor platelets by anticoagulation protocol: Yes   Plan:  Coumadin 2.5mg  tonight (per usual home dosage) Daily INR  Thank you Anette Guarneri, PharmD [phone] 10/21/2015,11:00 AM

## 2015-10-21 NOTE — Progress Notes (Signed)
SUBJECTIVE: The patient is doing well today.  At this time, he denies chest pain, shortness of breath, or any new concerns.  Tolerating the Mexiletine/current regime, ambulate in the halls yesterday felt OK.  Marland Kitchen amiodarone  400 mg Oral Daily  . levothyroxine  75 mcg Oral QAC breakfast  . metoprolol tartrate  12.5 mg Oral BID  . mexiletine  150 mg Oral 3 times per day  . Warfarin - Pharmacist Dosing Inpatient   Does not apply q1800      OBJECTIVE: Physical Exam: Filed Vitals:   10/20/15 2102 10/20/15 2359 10/21/15 0701 10/21/15 0818  BP: 107/74 114/77  134/88  Pulse: 58 59  71  Temp: 97.4 F (36.3 C) 98.8 F (37.1 C)  98.6 F (37 C)  TempSrc: Oral Axillary  Oral  Resp: 16   18  Height:      Weight:   204 lb 9.6 oz (92.806 kg)   SpO2: 95% 97%  98%    Intake/Output Summary (Last 24 hours) at 10/21/15 0956 Last data filed at 10/21/15 0949  Gross per 24 hour  Intake    800 ml  Output    895 ml  Net    -95 ml    Telemetry reveals V paced rhythm, occ PVCs  GEN- The patient is well appearing, alert and oriented x 3 today.   Head- normocephalic, atraumatic Eyes-  Sclera clear, conjunctiva pink Ears- hearing intact Oropharynx- clear Neck- supple, no JVP Lungs- Clear to ausculation bilaterally, normal work of breathing Heart- Regular rate and rhythm (paced), valve is appreciated, no rubs or gallops GI- soft, NT, ND Extremities- no clubbing, cyanosis, or edema Skin- no rash or lesion Psych- euthymic mood, full affect Neuro- no gross deficits appreciated  LABS: Basic Metabolic Panel:  Recent Labs  10/19/15 1617 10/20/15 0459  NA 138 137  K 4.7 4.4  CL 108 108  CO2 20* 21*  GLUCOSE 141* 116*  BUN 35* 34*  CREATININE 1.46* 1.33*  CALCIUM 9.2 8.9  MG  --  2.2   CBC:  Recent Labs  10/19/15 1617  WBC 8.8  HGB 15.5  HCT 48.1  MCV 92.0  PLT 155   Cardiac Enzymes:  Recent Labs  10/19/15 1645  TROPONINI 0.05*     ASSESSMENT AND PLAN:   1. 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 NICM/ICD  appears compensated by exam and cxr  On BB/ACE  3. PAFib  CHA2DS2Vasc is at least 2 on warfarin  INR today is 3.43, management with pharmacy  4. VHD/mechanical MVR  Coumadin managed out patient with coumadin clinic     Here with pharmacy  5. Chest pressure  Not active, one Trop negative  ischemic work up out patient    Tommye Standard, Hershal Coria 10/21/2015 9:56 AM  EP Attending  Patient seen and examined. Agree with the findings as noted above. CV - RRR with mechanical S1. Lungs - clear bilaterally. Ext - no edema. A/P 1. Wide QRS tachcyardia - he has been quiet since receiving an ICD shock. He appears to be tolerating mexitil. Will follow. 2. Mechanical  mitral valve - he will continue his anti-coagulation. The presence of his valve has made VT ablation much more difficult. 3. PAF - he appears to be mostly in atrial fib at this point, despite amiodarone. 4. Disp. - I would anticipate discharge home tomorrow if he remains stable.  Mikle Bosworth.D.

## 2015-10-22 LAB — PROTIME-INR
INR: 4.1 — ABNORMAL HIGH (ref 0.00–1.49)
PROTHROMBIN TIME: 38.7 s — AB (ref 11.6–15.2)

## 2015-10-22 MED ORDER — WARFARIN SODIUM 5 MG PO TABS
2.5000 mg | ORAL_TABLET | Freq: Every day | ORAL | Status: DC
Start: 2015-10-22 — End: 2015-10-22

## 2015-10-22 MED ORDER — MEXILETINE HCL 150 MG PO CAPS: 150.0000 mg | ORAL_CAPSULE | Freq: Three times a day (TID) | ORAL | Status: DC

## 2015-10-22 MED ORDER — METOPROLOL TARTRATE 25 MG PO TABS: 12.5000 mg | ORAL_TABLET | Freq: Two times a day (BID) | ORAL | Status: DC

## 2015-10-22 MED ORDER — WARFARIN SODIUM 5 MG PO TABS: 2.5000 mg | ORAL_TABLET | Freq: Every day | ORAL | Status: DC

## 2015-10-22 NOTE — Care Management Important Message (Signed)
Important Message  Patient Details  Name: Austin [name] MRN: [id number] Date of Birth: 04-25-1941   Medicare Important Message Given:  Yes    Barb Merino [place] 10/22/2015, 10:29 AM

## 2015-10-22 NOTE — Discharge Summary (Signed)
ELECTROPHYSIOLOGY PROCEDURE DISCHARGE SUMMARY    Patient ID: Austin [name],  MRN: [id number], DOB/AGE: [birth year] 75 y.o.  Admit date: 10/19/2015 Discharge date: 10/22/2015  Primary Care Physician: Mathews Argyle, MD Primary Cardiologist: Dr. Tamala Julian Electrophysiologist: Dr. Lovena Le  Primary Discharge Diagnosis:  1. VT 2. NICM  Secondary Discharge Diagnosis:  1. Chronic CHF (compensated) 2. PAFib     CHA2DA2Vasc is at least 2 3. Mechanical MVR     Warfarin managed with the coumadin clinic (he does INR at home and is adjusted by the coumadin clinic)  No Known Allergies   Procedures This Admission:  None  Brief HPI: Austin [name] is a 75 y.o. male was admitted 10/19/15 with c/o nausea and SOB, noted with WCT and admitted for further evaluation and management.  Hospital Course:   [name] is a 75 y.o. male with history of NICM/CRT-D, chronic CHF VHD with MVR (mechanical), persistent AFib and VT. His EPS disclosed 4 different VTs, 2 were succesfuly ablated and felt to be his clinical VT. He had a device transmission noting he was back in AFib/flutter and Dr. Lovena Le started metoprolol 25mg  BID. He was last seen in the office by myself on 10/13/15 noting since his VT storm and hospital stay in January he had been feeling poorly. Prior to that he was playing tennis regularly and doing well, since that time, he was feeling tired, no energy, napping even. He has some DOE but no rest SOB, no symptoms of PND or orthopnea. His home/morning weights generally 202-205, today was 205 at home. He has waxing/waning ankle edema over the last few months. He mentions sporadic CP as well, usually when up/around, tight across his chest or upper abdomen, it can last hours in duration and when he has it, is unchanged with position or exertion. No dizzy spells, near syncope or syncope, no shocks from his device. Since being started on the metoprolol especially he feels like his  energy is worse though his palpitations for the most part are resolved. After discussion with Dr. Lovena Le we decreased his metoprolol in half.  Now he comes in admitted to [facility] with c/o feeling nauseous. He also noted fatigue and shortness of breath. He checked his BP and heart rate and noted that his heart rate was in the 130s. He was found to be in a WCT irregular and by the admit note, his device was interrogated and felt to be an SVT was given IV metoprolol and started on diltiazem gtt. The patient states that on the lower BB dose after a day or two really felt better, his energy was better and had 3 great days, then woke feeling sick to his stomach, tired, weak, and with some left sided chest discomfort at the day went. No near syncope or syncope.   EP saw the patient Monday, his EKG reviewed by Dr. Lovena Le, felt more likely to be slow VT and his diltiazem gtt stopped.  The patient was shocked by his ICD and his rhythm afterwards V paced 60.  He again had multiple VT's and was started on Mexiletine.  He has tolerated the medication, he was monitored on telemetry for an additional day without recurrent VT and will be discharged today.  The patient was seen and examined by Dr. Lovena Le and considered stable for discharge.  His INR today 4.10 Dr.Farra Nikolic instructed the patient to take 2.5mg  tonight and tomorrow night and do his INR Friday and adjust as per the coumadin clinic recommendations,  early f/u with Dr. Lovena Le is scheduled.  No driving for 6 months, the patient is aware.  Physical Exam: Filed Vitals:   10/21/15 1201 10/21/15 2132 10/22/15 0608 10/22/15 0858  BP: 131/95 140/92 134/81 119/74  Pulse: 70 58 64 60  Temp: 98.4 F (36.9 C) 98.1 F (36.7 C) 97.9 F (36.6 C)   TempSrc: Oral Oral Oral   Resp: 20 17    Height:      Weight:   205 lb 6.4 oz (93.169 kg)   SpO2: 95% 100% 97% 97%    GEN- The patient is well appearing, alert and oriented x 3 today.   HEENT: normocephalic,  atraumatic; sclera clear, conjunctiva pink; hearing intact; oropharynx clear Lungs- Clear to ausculation bilaterally, normal work of breathing.  No wheezes, rales, rhonchi Heart- Regular rate and rhythm, no murmurs, rubs or gallops, PMI not laterally displaced GI- soft, non-tender, non-distended, bowel sounds present Extremities- no clubbing, cyanosis, or edema MS- no significant deformity or atrophy Skin- warm and dry, no rash or lesion Psych- euthymic mood, full affect Neuro- no gross defecits  Labs:   Lab Results  Component Value Date   WBC 8.8 10/19/2015   HGB 15.5 10/19/2015   HCT 48.1 10/19/2015   MCV 92.0 10/19/2015   PLT 155 10/19/2015     Recent Labs Lab 10/20/15 0459  NA 137  K 4.4  CL 108  CO2 21*  BUN 34*  CREATININE 1.33*  CALCIUM 8.9  GLUCOSE 116*    Discharge Medications:    Medication List    TAKE these medications        acetaminophen 500 MG tablet  Commonly known as:  TYLENOL  Take 1,000 mg by mouth every 6 (six) hours as needed (pain).     amiodarone 200 MG tablet  Commonly known as:  PACERONE  Take 2 tablets (400 mg total) by mouth daily.     Fish Oil 1000 MG Caps  Take 1,000 mg by mouth at bedtime.     folic acid 1 MG tablet  Commonly known as:  FOLVITE  Take 1 mg by mouth at bedtime.     levothyroxine 75 MCG tablet  Commonly known as:  SYNTHROID, LEVOTHROID  Take 1 tablet (75 mcg total) by mouth daily.     lisinopril 10 MG tablet  Commonly known as:  PRINIVIL,ZESTRIL  Take 10 mg by mouth at bedtime.     metoprolol tartrate 25 MG tablet  Commonly known as:  LOPRESSOR  Take 0.5 tablets (12.5 mg total) by mouth 2 (two) times daily.     mexiletine 150 MG capsule  Commonly known as:  MEXITIL  Take 1 capsule (150 mg total) by mouth 3 (three) times daily.     multivitamin with minerals Tabs tablet  Take 1 tablet by mouth daily.     warfarin 5 MG tablet  Commonly known as:  COUMADIN  Take 0.5-1 tablets (2.5-5 mg total) by mouth  at bedtime. Take 1/2 tablet (2.5 mg) by mouth on Tuesday, Thursday,  take 1 tablet (5 mg) on all other days  Notes to Patient:  Take 2.5mg  dose tonight 10/22/15 and tomorrow night 10/23/15 INR check Friday and further as per the coumadin clinic instructions        Disposition: Home Discharge Instructions    Diet - low sodium heart healthy    Complete by:  As directed      Increase activity slowly    Complete by:  As directed  Follow-up Information    Follow up with [facility] On 10/27/2015.   Specialty:  Cardiology   Why:  11:30AM lab/coumadin clinic   Contact information:   [address] [place] [phone]      Follow up with [name], MD On 11/03/2015.   Specialty:  Cardiology   Why:  10:15AM   Contact information:   [address] [address] [phone]       Duration of Discharge Encounter: Greater than 30 minutes including physician time.  Venetia Night, PA-C 10/22/2015 9:03 AM    EP Attending  Patient seen and examined. Agree with the findings as noted above. He is stable for discharge. He will continue his home meds and add Mexitil. He will have his INR checked on Friday.  Mikle Bosworth.D.

## 2015-10-24 ENCOUNTER — Ambulatory Visit (INDEPENDENT_AMBULATORY_CARE_PROVIDER_SITE_OTHER): Payer: Medicare Other | Admitting: Cardiovascular Disease

## 2015-10-24 DIAGNOSIS — Z5181 Encounter for therapeutic drug level monitoring: Secondary | ICD-10-CM

## 2015-10-24 LAB — POCT INR: INR: 3.7

## 2015-10-26 ENCOUNTER — Other Ambulatory Visit: Payer: Self-pay

## 2015-10-26 ENCOUNTER — Other Ambulatory Visit: Payer: Self-pay | Admitting: Cardiology

## 2015-10-26 ENCOUNTER — Inpatient Hospital Stay (HOSPITAL_COMMUNITY)
Admission: EM | Admit: 2015-10-26 | Discharge: 2015-10-29 | DRG: 309 | Disposition: A | Discharge status: Home / Self Care | Payer: Medicare Other | Attending: Internal Medicine | Admitting: Internal Medicine

## 2015-10-26 ENCOUNTER — Emergency Department (HOSPITAL_COMMUNITY): Payer: Medicare Other

## 2015-10-26 ENCOUNTER — Telehealth: Payer: Self-pay | Admitting: Cardiology

## 2015-10-26 ENCOUNTER — Encounter (HOSPITAL_COMMUNITY): Payer: Self-pay | Admitting: *Deleted

## 2015-10-26 DIAGNOSIS — Z95810 Presence of automatic (implantable) cardiac defibrillator: Secondary | ICD-10-CM

## 2015-10-26 DIAGNOSIS — G4730 Sleep apnea, unspecified: Secondary | ICD-10-CM | POA: Diagnosis present

## 2015-10-26 DIAGNOSIS — I442 Atrioventricular block, complete: Secondary | ICD-10-CM | POA: Diagnosis present

## 2015-10-26 DIAGNOSIS — H902 Conductive hearing loss, unspecified: Secondary | ICD-10-CM | POA: Diagnosis present

## 2015-10-26 DIAGNOSIS — I472 Ventricular tachycardia, unspecified: Secondary | ICD-10-CM

## 2015-10-26 DIAGNOSIS — R079 Chest pain, unspecified: Secondary | ICD-10-CM | POA: Diagnosis not present

## 2015-10-26 DIAGNOSIS — I5023 Acute on chronic systolic (congestive) heart failure: Secondary | ICD-10-CM | POA: Diagnosis present

## 2015-10-26 DIAGNOSIS — E039 Hypothyroidism, unspecified: Secondary | ICD-10-CM

## 2015-10-26 DIAGNOSIS — I429 Cardiomyopathy, unspecified: Secondary | ICD-10-CM | POA: Diagnosis present

## 2015-10-26 DIAGNOSIS — I481 Persistent atrial fibrillation: Secondary | ICD-10-CM | POA: Diagnosis present

## 2015-10-26 DIAGNOSIS — I4892 Unspecified atrial flutter: Secondary | ICD-10-CM | POA: Diagnosis present

## 2015-10-26 DIAGNOSIS — I5022 Chronic systolic (congestive) heart failure: Secondary | ICD-10-CM | POA: Diagnosis present

## 2015-10-26 DIAGNOSIS — Z8679 Personal history of other diseases of the circulatory system: Secondary | ICD-10-CM

## 2015-10-26 DIAGNOSIS — Z7901 Long term (current) use of anticoagulants: Secondary | ICD-10-CM

## 2015-10-26 DIAGNOSIS — Z952 Presence of prosthetic heart valve: Secondary | ICD-10-CM | POA: Insufficient documentation

## 2015-10-26 LAB — CBC
HEMATOCRIT: 47.8 % (ref 39.0–52.0)
Hemoglobin: 16 g/dL (ref 13.0–17.0)
MCH: 30.6 pg (ref 26.0–34.0)
MCHC: 33.5 g/dL (ref 30.0–36.0)
MCV: 91.4 fL (ref 78.0–100.0)
Platelets: 172 10*3/uL (ref 150–400)
RBC: 5.23 MIL/uL (ref 4.22–5.81)
RDW: 16.6 % — AB (ref 11.5–15.5)
WBC: 10.4 10*3/uL (ref 4.0–10.5)

## 2015-10-26 LAB — BASIC METABOLIC PANEL
Anion gap: 9 (ref 5–15)
BUN: 25 mg/dL — AB (ref 6–20)
CHLORIDE: 104 mmol/L (ref 101–111)
CO2: 23 mmol/L (ref 22–32)
CREATININE: 1.43 mg/dL — AB (ref 0.61–1.24)
Calcium: 9.3 mg/dL (ref 8.9–10.3)
GFR calc Af Amer: 55 mL/min — ABNORMAL LOW (ref >60)
GFR calc non Af Amer: 47 mL/min — ABNORMAL LOW (ref >60)
Glucose, Bld: 143 mg/dL — ABNORMAL HIGH (ref 65–99)
POTASSIUM: 4.3 mmol/L (ref 3.5–5.1)
Sodium: 136 mmol/L (ref 135–145)

## 2015-10-26 LAB — MAGNESIUM: MAGNESIUM: 2.1 mg/dL (ref 1.7–2.4)

## 2015-10-26 LAB — PROTIME-INR
INR: 2.76 — AB (ref 0.00–1.49)
PROTHROMBIN TIME: 28.7 s — AB (ref 11.6–15.2)

## 2015-10-26 LAB — I-STAT TROPONIN, ED: Troponin i, poc: 0.04 ng/mL (ref 0.00–0.08)

## 2015-10-26 LAB — BRAIN NATRIURETIC PEPTIDE: B Natriuretic Peptide: 1387.1 pg/mL — ABNORMAL HIGH (ref 0.0–100.0)

## 2015-10-26 MED ORDER — FUROSEMIDE 40 MG PO TABS: 40.0000 mg | ORAL_TABLET | Freq: Every day | ORAL | Status: DC

## 2015-10-26 MED ORDER — METOPROLOL TARTRATE 1 MG/ML IV SOLN
5.0000 mg | Freq: Once | INTRAVENOUS | Status: AC
  Administered 2015-10-26: 5 mg via INTRAVENOUS
  Filled 2015-10-26: qty 5

## 2015-10-26 NOTE — ED Notes (Addendum)
Pacemaker interrogated by charge nurse, [employer] called, no abnormal activity, pt is 100% paced, aflutter since beginning of march. edp notified of the same

## 2015-10-26 NOTE — ED Notes (Signed)
Pt is in xray, tech to bring pt to room on completion

## 2015-10-26 NOTE — ED Notes (Signed)
Pt c/o mid non radiating CP onset today upon awakening, c/o SOB, pt reports nausea, denies v/d, similar symptoms x7 days & was evaluated here, pt reports being admitted for 3 days, pt has Banker in place, with reported shock last Monday, pt A&O x4

## 2015-10-26 NOTE — Telephone Encounter (Signed)
Pt's wife called. Austin [name] had two pieces of pizza last night watching the basketball game. Today wgt up 2 lbs and he feels a little SOB. I called in Lasix 40 mg x 2 doses.  Kerin Ransom PA-C 10/26/2015 9:24 AM

## 2015-10-26 NOTE — ED Provider Notes (Signed)
CSN: [id number]     Arrival date & time 10/26/15  1842 History   First MD Initiated Contact with Patient 10/26/15 1937     Chief Complaint  Patient presents with  . Chest Pain     (Consider location/radiation/quality/duration/timing/severity/associated sxs/prior Treatment) Patient is a 75 y.o. male presenting with chest pain. The history is provided by the patient.  Chest Pain Pain location:  Substernal area Pain quality: pressure   Pain radiates to:  Does not radiate Pain radiates to the back: no   Pain severity:  Moderate Onset quality:  Gradual Duration:  1 day Timing:  Constant Progression:  Unchanged Chronicity:  Recurrent Relieved by:  Nothing Worsened by:  Nothing tried Ineffective treatments:  None tried Associated symptoms: shortness of breath   Associated symptoms: no abdominal pain, no fever, no headache, no palpitations and not vomiting   Risk factors: coronary artery disease    75 yo M With a chief complaint of chest pain and palpitations. Is recurrent issue for this patient. History of recent admission to the hospital and found to have slow ventricular tachycardia. This was fixed with electricity by the electrophysiologist. He woke up this morning with similar symptoms. Heart rate is not been faster than 120s who is not thought care until now. Has been feeling generally crappy all day today. Was started on a class Ia antiarrhythmic at his last visit. Also had a 3 pound weight gain since yesterday he attributes to having 2 pieces of pizza.  Past Medical History  Diagnosis Date  . Nonischemic cardiomyopathy ([place])   . 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 Psychosexual dysfunction with inhibited sexual excitement   . Conductive hearing loss   . Syncope and collapse   . Atrial fibrillation ([place])   . Intraspinal abscess   . Mitral valve insufficiency and aortic valve insufficiency      s/p MVR mechanical  . S/P mitral valve replacement   . Unspecified sleep apnea     last sleep study 11/07  . Ventricular tachycardia [facility])    Past Surgical History  Procedure Laterality Date  . Mitral valve replacement      w #33 [facility]  . Valve replacement  2000  . Evacution of epidural lumbar epidural abscess  1999  . Thyroidectomy    . Tonsillectomy    . Dental implants    . Cardiac catheterization  04/24/2002  . Insert / replace / remove pacemaker  11/2008  . Cardioversion  03/09/2012    Procedure: CARDIOVERSION;  Surgeon: Evans Lance, MD;  Location: [facility];  Service: Cardiovascular;  Laterality: N/A;  . Cardioversion N/A 11/22/2013    Procedure: CARDIOVERSION;  Surgeon: Sanda Klein, MD;  Location: [place] ENDOSCOPY;  Service: Cardiovascular;  Laterality: N/A;  . Permanent pacemaker insertion N/A 08/05/2011    Procedure: PERMANENT PACEMAKER INSERTION;  Surgeon: Evans Lance, MD;  Location: [facility] CATH LAB;  Service: Cardiovascular;  Laterality: N/A;  . Electrophysiologic study N/A 09/15/2015    Procedure: Stephanie Coup Ablation;  Surgeon: Evans Lance, MD;  Location: [place] CV LAB;  Service: Cardiovascular;  Laterality: N/A;  . Cardiac valve replacement     Family History  Problem Relation Age of Onset  . Heart disease Mother   . Heart failure Mother   . Heart disease Father   . Heart failure Father    Social History  Substance Use Topics  . Smoking  status: Never Smoker   . Smokeless tobacco: None  . Alcohol Use: Yes     Comment: occasionally    Review of Systems  Constitutional: Negative for fever and chills.  HENT: Negative for congestion and facial swelling.   Eyes: Negative for discharge and visual disturbance.  Respiratory: Positive for shortness of breath.   Cardiovascular: Positive for chest pain. Negative for palpitations.  Gastrointestinal: Negative for vomiting, abdominal pain and diarrhea.  Musculoskeletal: Negative for myalgias and arthralgias.   Skin: Negative for color change and rash.  Neurological: Negative for tremors, syncope and headaches.  Psychiatric/Behavioral: Negative for confusion and dysphoric mood.      Allergies  Review of patient's allergies indicates no known allergies.  Home Medications   Prior to Admission medications   Medication Sig Start Date End Date Taking? Authorizing Provider  acetaminophen (TYLENOL) 500 MG tablet Take 1,000 mg by mouth every 6 (six) hours as needed (pain).   Yes Historical Provider, MD  amiodarone (PACERONE) 200 MG tablet Take 2 tablets (400 mg total) by mouth daily. 08/25/15  Yes Renee Dyane Dustman, PA-C  folic acid (FOLVITE) 1 MG tablet Take 1 mg by mouth at bedtime.    Yes Historical Provider, MD  furosemide (LASIX) 40 MG tablet Take 1 tablet (40 mg total) by mouth daily. 10/26/15 10/28/15 Yes Luke K Kilroy, PA-C  levothyroxine (SYNTHROID, LEVOTHROID) 75 MCG tablet Take 1 tablet (75 mcg total) by mouth daily. Patient taking differently: Take 75 mcg by mouth daily before breakfast.  11/18/14  Yes Evans Lance, MD  lisinopril (PRINIVIL,ZESTRIL) 10 MG tablet Take 10 mg by mouth at bedtime.  05/17/15  Yes Historical Provider, MD  metoprolol tartrate (LOPRESSOR) 25 MG tablet Take 0.5 tablets (12.5 mg total) by mouth 2 (two) times daily. 10/22/15  Yes Renee Dyane Dustman, PA-C  mexiletine (MEXITIL) 150 MG capsule Take 1 capsule (150 mg total) by mouth 3 (three) times daily. 10/22/15  Yes Renee Dyane Dustman, PA-C  Multiple Vitamin (MULTIVITAMIN WITH MINERALS) TABS tablet Take 1 tablet by mouth daily.   Yes Historical Provider, MD  Omega-3 Fatty Acids (FISH OIL) 1000 MG CAPS Take 1,000 mg by mouth at bedtime.    Yes Historical Provider, MD  warfarin (COUMADIN) 5 MG tablet Take 0.5-1 tablets (2.5-5 mg total) by mouth at bedtime. Take 1/2 tablet (2.5 mg) by mouth on Tuesday, Thursday,  take 1 tablet (5 mg) on all other days 10/22/15  Yes Renee Dyane Dustman, PA-C   BP 102/89 mmHg  Pulse 115  Temp(Src) 97.8  F (36.6 C) (Oral)  Resp 22  Ht 6\' 2"  (1.88 m)  Wt 208 lb 5.4 oz (94.5 kg)  BMI 26.74 kg/m2  SpO2 96% Physical Exam  Constitutional: He is oriented to person, place, and time. He appears well-developed and well-nourished.  HENT:  Head: Normocephalic and atraumatic.  Eyes: EOM are normal. Pupils are equal, round, and reactive to light.  Neck: Normal range of motion. Neck supple. No JVD present.  Cardiovascular: Regular rhythm and intact distal pulses.  Tachycardia present.  Exam reveals no gallop and no friction rub.   No murmur heard. Pulmonary/Chest: No respiratory distress. He has no wheezes.  Abdominal: He exhibits no distension. There is no tenderness. There is no rebound and no guarding.  Musculoskeletal: Normal range of motion.  Neurological: He is alert and oriented to person, place, and time.  Skin: No rash noted. No pallor.  Psychiatric: He has a normal mood and affect. His behavior is normal.  Nursing note and vitals reviewed.   ED Course  Procedures (including critical care time) Labs Review Labs Reviewed  BASIC METABOLIC PANEL - Abnormal; Notable for the following:    Glucose, Bld 143 (*)    BUN 25 (*)    Creatinine, Ser 1.43 (*)    GFR calc non Af Amer 47 (*)    GFR calc Af Amer 55 (*)    All other components within normal limits  CBC - Abnormal; Notable for the following:    RDW 16.6 (*)    All other components within normal limits  PROTIME-INR - Abnormal; Notable for the following:    Prothrombin Time 28.7 (*)    INR 2.76 (*)    All other components within normal limits  BRAIN NATRIURETIC PEPTIDE - Abnormal; Notable for the following:    B Natriuretic Peptide 1387.1 (*)    All other components within normal limits  MRSA PCR SCREENING  MAGNESIUM  I-STAT TROPOININ, ED    Imaging Review Dg Chest 2 View  10/26/2015  CLINICAL DATA:  Chest pain since last evening. EXAM: CHEST  2 VIEW COMPARISON:  10/19/2015 FINDINGS: The heart is mildly enlarged but stable.  Stable pacer wires. Stable surgical changes from mitral valve replacement surgery. Mild chronic lung changes but no acute pulmonary findings. No pleural effusions. The bony thorax is intact. IMPRESSION: Stable mild cardiac enlargement. Chronic lung changes but no acute pulmonary findings. Electronically Signed   By: Marijo Sanes M.D.   On: 10/26/2015 19:53   I have personally reviewed and evaluated these images and lab results as part of my medical decision-making.   EKG Interpretation   Date/Time:  Sunday October 26 2015 18:58:58 EDT Ventricular Rate:  112 PR Interval:  122 QRS Duration: 194 QT Interval:  378 QTC Calculation: 515 R Axis:   -35 Text Interpretation:  Sinus tachycardia Wide QRS rhythm SINCE LAST TRACING  HEART RATE HAS INCREASED Confirmed by Brayla Pat MD, Quillian Quince [id number]) on 10/26/2015  7:42:29 PM      MDM   Final diagnoses:  Ventricular tachycardia ([place])    75 yo M with a complex cardiac history including mechanical mitral valve repair on Coumadin, history of ventricular tachycardia status post ablation. Comes in with a chief complaint of chest pain and tachycardia. EKG in a regular rhythm wide complex. Interrogation was performed in patient was stated to be in atrial flutter since March. Given 5mg  of metop without improvement.  Stable BP. This was discussed with cardiology, Dr. Rosanna Randy. Patient was just admitted for was found to be slow ventricular tachycardia we'll assume that this is the same rhythm. Patient will be admitted to stepdown.  CRITICAL CARE Performed by: Cecilio Asper   Total critical care time: 35 minutes  Critical care time was exclusive of separately billable procedures and treating other patients.  Critical care was necessary to treat or prevent imminent or life-threatening deterioration.  Critical care was time spent personally by me on the following activities: development of treatment plan with patient and/or surrogate as well as nursing,  discussions with consultants, evaluation of patient's response to treatment, examination of patient, obtaining history from patient or surrogate, ordering and performing treatments and interventions, ordering and review of laboratory studies, ordering and review of radiographic studies, pulse oximetry and re-evaluation of patient's condition.  The patients results and plan were reviewed and discussed.   Any x-rays performed were independently reviewed by myself.   Differential diagnosis were considered with the presenting HPI.  Medications  metoprolol (LOPRESSOR) injection 5 mg (5 mg Intravenous Given 10/26/15 2053)    Filed Vitals:   10/26/15 2015 10/26/15 2100 10/26/15 2145 10/26/15 2225  BP: 117/88 105/89 101/87 102/89  Pulse: 114 111 114 115  Temp:    97.8 F (36.6 C)  TempSrc:    Oral  Resp: 21 21 22    Height:    6\' 2"  (1.88 m)  Weight:    208 lb 5.4 oz (94.5 kg)  SpO2: 92% 90% 91% 96%    Final diagnoses:  Ventricular tachycardia ([place])    Admission/ observation were discussed with the admitting physician, patient and/or family and they are comfortable with the plan.    Deno Etienne, DO 10/26/15 2315

## 2015-10-26 NOTE — ED Notes (Signed)
aicd interrogated.  

## 2015-10-27 DIAGNOSIS — Z95810 Presence of automatic (implantable) cardiac defibrillator: Secondary | ICD-10-CM | POA: Diagnosis not present

## 2015-10-27 DIAGNOSIS — I4892 Unspecified atrial flutter: Secondary | ICD-10-CM | POA: Diagnosis present

## 2015-10-27 DIAGNOSIS — I5022 Chronic systolic (congestive) heart failure: Secondary | ICD-10-CM

## 2015-10-27 DIAGNOSIS — I442 Atrioventricular block, complete: Secondary | ICD-10-CM | POA: Diagnosis present

## 2015-10-27 DIAGNOSIS — I472 Ventricular tachycardia: Secondary | ICD-10-CM | POA: Diagnosis present

## 2015-10-27 DIAGNOSIS — Z952 Presence of prosthetic heart valve: Secondary | ICD-10-CM | POA: Diagnosis not present

## 2015-10-27 DIAGNOSIS — I481 Persistent atrial fibrillation: Secondary | ICD-10-CM | POA: Diagnosis present

## 2015-10-27 DIAGNOSIS — R079 Chest pain, unspecified: Secondary | ICD-10-CM | POA: Diagnosis present

## 2015-10-27 DIAGNOSIS — Z954 Presence of other heart-valve replacement: Secondary | ICD-10-CM

## 2015-10-27 DIAGNOSIS — R000 Tachycardia, unspecified: Secondary | ICD-10-CM | POA: Diagnosis not present

## 2015-10-27 DIAGNOSIS — I429 Cardiomyopathy, unspecified: Secondary | ICD-10-CM | POA: Diagnosis present

## 2015-10-27 DIAGNOSIS — G4730 Sleep apnea, unspecified: Secondary | ICD-10-CM | POA: Diagnosis present

## 2015-10-27 DIAGNOSIS — Z8679 Personal history of other diseases of the circulatory system: Secondary | ICD-10-CM | POA: Diagnosis not present

## 2015-10-27 DIAGNOSIS — H902 Conductive hearing loss, unspecified: Secondary | ICD-10-CM | POA: Diagnosis present

## 2015-10-27 DIAGNOSIS — E039 Hypothyroidism, unspecified: Secondary | ICD-10-CM

## 2015-10-27 DIAGNOSIS — Z7901 Long term (current) use of anticoagulants: Secondary | ICD-10-CM | POA: Diagnosis not present

## 2015-10-27 LAB — MAGNESIUM: Magnesium: 2 mg/dL (ref 1.7–2.4)

## 2015-10-27 LAB — BASIC METABOLIC PANEL
Anion gap: 10 (ref 5–15)
BUN: 26 mg/dL — ABNORMAL HIGH (ref 6–20)
CALCIUM: 9 mg/dL (ref 8.9–10.3)
CO2: 23 mmol/L (ref 22–32)
CREATININE: 1.52 mg/dL — AB (ref 0.61–1.24)
Chloride: 104 mmol/L (ref 101–111)
GFR, EST AFRICAN AMERICAN: 51 mL/min — AB (ref >60)
GFR, EST NON AFRICAN AMERICAN: 44 mL/min — AB (ref >60)
GLUCOSE: 108 mg/dL — AB (ref 65–99)
Potassium: 4.2 mmol/L (ref 3.5–5.1)
Sodium: 137 mmol/L (ref 135–145)

## 2015-10-27 LAB — TROPONIN I
Troponin I: 0.08 ng/mL — ABNORMAL HIGH (ref <0.031)
Troponin I: 0.07 ng/mL — ABNORMAL HIGH (ref <0.031)
Troponin I: 0.08 ng/mL — ABNORMAL HIGH (ref <0.031)

## 2015-10-27 LAB — PROTIME-INR
INR: 2.89 — AB (ref 0.00–1.49)
PROTHROMBIN TIME: 29.7 s — AB (ref 11.6–15.2)

## 2015-10-27 LAB — MRSA PCR SCREENING: MRSA BY PCR: NEGATIVE

## 2015-10-27 MED ORDER — MEXILETINE HCL 150 MG PO CAPS
150.0000 mg | ORAL_CAPSULE | Freq: Three times a day (TID) | ORAL | Status: DC
  Administered 2015-10-27: 150 mg via ORAL
  Filled 2015-10-27: qty 1

## 2015-10-27 MED ORDER — SODIUM CHLORIDE 0.9% FLUSH
3.0000 mL | Freq: Two times a day (BID) | INTRAVENOUS | Status: DC
  Administered 2015-10-27 – 2015-10-29 (×6): 3 mL via INTRAVENOUS

## 2015-10-27 MED ORDER — LEVOTHYROXINE SODIUM 75 MCG PO TABS
75.0000 ug | ORAL_TABLET | Freq: Every day | ORAL | Status: DC
  Administered 2015-10-28 – 2015-10-29 (×2): 75 ug via ORAL
  Filled 2015-10-27 (×3): qty 1

## 2015-10-27 MED ORDER — MIDAZOLAM HCL 2 MG/2ML IJ SOLN
2.0000 mg | Freq: Once | INTRAMUSCULAR | Status: AC
  Administered 2015-10-27: 2 mg via INTRAVENOUS

## 2015-10-27 MED ORDER — FENTANYL CITRATE (PF) 100 MCG/2ML IJ SOLN
25.0000 ug | Freq: Once | INTRAMUSCULAR | Status: AC
  Administered 2015-10-27: 25 ug via INTRAVENOUS

## 2015-10-27 MED ORDER — CETYLPYRIDINIUM CHLORIDE 0.05 % MT LIQD
7.0000 mL | Freq: Two times a day (BID) | OROMUCOSAL | Status: DC
  Administered 2015-10-27 (×2): 7 mL via OROMUCOSAL

## 2015-10-27 MED ORDER — WARFARIN - PHARMACIST DOSING INPATIENT: Freq: Every day | Status: DC

## 2015-10-27 MED ORDER — WARFARIN SODIUM 5 MG PO TABS: 5.0000 mg | ORAL_TABLET | ORAL | Status: DC

## 2015-10-27 MED ORDER — WARFARIN - PHYSICIAN DOSING INPATIENT: Freq: Every day | Status: DC

## 2015-10-27 MED ORDER — LISINOPRIL 10 MG PO TABS
10.0000 mg | ORAL_TABLET | Freq: Every day | ORAL | Status: DC
  Administered 2015-10-27 – 2015-10-28 (×3): 10 mg via ORAL
  Filled 2015-10-27 (×3): qty 1

## 2015-10-27 MED ORDER — WARFARIN SODIUM 5 MG PO TABS
5.0000 mg | ORAL_TABLET | ORAL | Status: DC
  Administered 2015-10-27 (×2): 5 mg via ORAL
  Filled 2015-10-27 (×2): qty 1

## 2015-10-27 MED ORDER — FENTANYL CITRATE (PF) 100 MCG/2ML IJ SOLN
INTRAMUSCULAR | Status: AC
  Administered 2015-10-27: 25 ug via INTRAVENOUS
  Filled 2015-10-27: qty 2

## 2015-10-27 MED ORDER — FUROSEMIDE 10 MG/ML IJ SOLN
40.0000 mg | Freq: Once | INTRAMUSCULAR | Status: AC
  Administered 2015-10-27: 40 mg via INTRAVENOUS
  Filled 2015-10-27: qty 4

## 2015-10-27 MED ORDER — FUROSEMIDE 40 MG PO TABS: 40.0000 mg | ORAL_TABLET | Freq: Every day | ORAL | Status: DC

## 2015-10-27 MED ORDER — METOPROLOL TARTRATE 1 MG/ML IV SOLN
2.5000 mg | Freq: Four times a day (QID) | INTRAVENOUS | Status: DC
  Administered 2015-10-27: 2.5 mg via INTRAVENOUS
  Filled 2015-10-27: qty 5

## 2015-10-27 MED ORDER — MIDAZOLAM HCL 2 MG/2ML IJ SOLN
INTRAMUSCULAR | Status: AC
  Administered 2015-10-27: 2 mg via INTRAVENOUS
  Filled 2015-10-27: qty 2

## 2015-10-27 MED ORDER — METOPROLOL TARTRATE 12.5 MG HALF TABLET
12.5000 mg | ORAL_TABLET | Freq: Two times a day (BID) | ORAL | Status: DC
  Administered 2015-10-27: 12.5 mg via ORAL
  Filled 2015-10-27: qty 1

## 2015-10-27 MED ORDER — MEXILETINE HCL 150 MG PO CAPS: 300.0000 mg | ORAL_CAPSULE | Freq: Three times a day (TID) | ORAL | Status: DC

## 2015-10-27 MED ORDER — AMIODARONE HCL 200 MG PO TABS
400.0000 mg | ORAL_TABLET | Freq: Every day | ORAL | Status: DC
  Administered 2015-10-27 – 2015-10-29 (×3): 400 mg via ORAL
  Filled 2015-10-27 (×4): qty 2

## 2015-10-27 MED ORDER — FOLIC ACID 1 MG PO TABS
1.0000 mg | ORAL_TABLET | Freq: Every day | ORAL | Status: DC
  Administered 2015-10-27 – 2015-10-28 (×3): 1 mg via ORAL
  Filled 2015-10-27 (×3): qty 1

## 2015-10-27 MED ORDER — WARFARIN SODIUM 2.5 MG PO TABS: 2.5000 mg | ORAL_TABLET | ORAL | Status: DC

## 2015-10-27 MED ORDER — MEXILETINE HCL 150 MG PO CAPS
300.0000 mg | ORAL_CAPSULE | Freq: Three times a day (TID) | ORAL | Status: DC
  Administered 2015-10-27 – 2015-10-29 (×7): 300 mg via ORAL
  Filled 2015-10-27 (×7): qty 2

## 2015-10-27 MED ORDER — METOPROLOL TARTRATE 12.5 MG HALF TABLET
12.5000 mg | ORAL_TABLET | Freq: Two times a day (BID) | ORAL | Status: DC
  Administered 2015-10-27 – 2015-10-29 (×5): 12.5 mg via ORAL
  Filled 2015-10-27 (×5): qty 1

## 2015-10-27 NOTE — Consult Note (Signed)
ELECTROPHYSIOLOGY CONSULT NOTE    Patient ID: Austin [name] MRN: [id number], DOB/AGE: 1940/10/24 75 y.o.  Admit date: 10/26/2015 Date of Consult: 10/27/2015  Primary Physician: Austin Argyle, MD Primary Cardiologist: Dr. Tamala [name] Electrophysiologist: Dr. Lovena [name]  Reason for Consultation: [facility]  HPI: Austin [name] is a 74 y.o. male just discharged from [facility] 10/22/15 for VT storm eventually getting ICD therapy, was started on Mexitil in addition to his metoprolol and amiodarone.  Saturday he again became very tired, weak and significant DOE with CP and felt his HR fast, and checking found it consistantly 110 or higher, at times 120's.  He continues to feel weak and tired, says he feels like he has become bedridden with this problem.  He does not have ongoing CP, not SOB at rest.  Labs noted: K+ 4.4, Mag 2.1 INR 2.76 poc Trop 0.04 Trop I is 0.08  pBNP 1387  PMHx: NICM/CRT-D, chronic CHF VHD with MVR (mechanical), persistent AFib and VT  Past Medical History  Diagnosis Date  . Nonischemic cardiomyopathy ([place])   . 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 Psychosexual dysfunction with inhibited sexual excitement   . Conductive hearing loss   . Syncope and collapse   . Atrial fibrillation ([place])   . Intraspinal abscess   . Mitral valve insufficiency and aortic valve insufficiency     s/p MVR mechanical  . S/P mitral valve replacement   . Unspecified sleep apnea     last sleep study 11/07  . Ventricular tachycardia [facility])      Surgical History:  Past Surgical History  Procedure Laterality Date  . Mitral valve replacement      w #33 [facility]  . Valve replacement  2000  . Evacution of epidural lumbar epidural abscess  1999  . Thyroidectomy    . Tonsillectomy    . Dental implants    . Cardiac catheterization  04/24/2002  . Insert / replace / remove pacemaker  11/2008  . Cardioversion  03/09/2012     Procedure: CARDIOVERSION;  Surgeon: Evans Lance, MD;  Location: [place];  Service: Cardiovascular;  Laterality: N/A;  . Cardioversion N/A 11/22/2013    Procedure: CARDIOVERSION;  Surgeon: Sanda Klein, MD;  Location: [place] [facility];  Service: Cardiovascular;  Laterality: N/A;  . Permanent pacemaker insertion N/A 08/05/2011    Procedure: PERMANENT PACEMAKER INSERTION;  Surgeon: Evans Lance, MD;  Location: [facility] CATH LAB;  Service: Cardiovascular;  Laterality: N/A;  . Electrophysiologic study N/A 09/15/2015    Procedure: Stephanie Coup Ablation;  Surgeon: Evans Lance, MD;  Location: [place] CV LAB;  Service: Cardiovascular;  Laterality: N/A;  . Cardiac valve replacement       Prescriptions prior to admission  Medication Sig Dispense Refill Last Dose  . acetaminophen (TYLENOL) 500 MG tablet Take 1,000 mg by mouth every 6 (six) hours as needed (pain).   10/26/2015 at Unknown time  . amiodarone (PACERONE) 200 MG tablet Take 2 tablets (400 mg total) by mouth daily. 60 tablet 3 10/26/2015 at Unknown time  . folic acid (FOLVITE) 1 MG tablet Take 1 mg by mouth at bedtime.    10/25/2015 at Unknown time  . furosemide (LASIX) 40 MG tablet Take 1 tablet (40 mg total) by mouth daily. 2 tablet 0 10/26/2015 at 1100  . levothyroxine (SYNTHROID, LEVOTHROID) 75 MCG tablet Take 1 tablet (75 mcg total) by mouth daily. (Patient taking differently: Take  75 mcg by mouth daily before breakfast. ) 30 tablet 6 10/26/2015 at Unknown time  . lisinopril (PRINIVIL,ZESTRIL) 10 MG tablet Take 10 mg by mouth at bedtime.   9 10/25/2015 at Unknown time  . metoprolol tartrate (LOPRESSOR) 25 MG tablet Take 0.5 tablets (12.5 mg total) by mouth 2 (two) times daily. 180 tablet 3 10/26/2015 at 0900  . mexiletine (MEXITIL) 150 MG capsule Take 1 capsule (150 mg total) by mouth 3 (three) times daily. 90 capsule 3 10/26/2015 at Unknown time  . Multiple Vitamin (MULTIVITAMIN WITH MINERALS) TABS tablet Take 1 tablet by mouth daily.   10/26/2015 at  Unknown time  . Omega-3 Fatty Acids (FISH OIL) 1000 MG CAPS Take 1,000 mg by mouth at bedtime.    10/25/2015 at Unknown time  . warfarin (COUMADIN) 5 MG tablet Take 0.5-1 tablets (2.5-5 mg total) by mouth at bedtime. Take 1/2 tablet (2.5 mg) by mouth on Tuesday, Thursday,  take 1 tablet (5 mg) on all other days   10/25/2015 at Unknown time    Inpatient Medications:  . amiodarone  400 mg Oral Daily  . folic acid  1 mg Oral QHS  . levothyroxine  75 mcg Oral QAC breakfast  . lisinopril  10 mg Oral QHS  . metoprolol  2.5 mg Intravenous 4 times per day  . mexiletine  150 mg Oral TID  . sodium chloride flush  3 mL Intravenous Q12H  . [START ON 10/28/2015] warfarin  2.5 mg Oral Once per day on Tue Thu  . warfarin  5 mg Oral Once per day on Sun Mon Wed Fri Sat  . Warfarin - Physician Dosing Inpatient   Does not apply q1800    Allergies: No Known Allergies  Social History   Social History  . Marital Status: Married    Spouse Name: N/A  . Number of Children: N/A  . Years of Education: N/A   Occupational History  . Not on file.   Social History Main Topics  . Smoking status: Never Smoker   . Smokeless tobacco: Not on file  . Alcohol Use: Yes     Comment: occasionally  . Drug Use: No  . Sexual Activity: Not on file   Other Topics Concern  . Not on file   Social History Narrative     Family History  Problem Relation Age of Onset  . Heart disease Mother   . Heart failure Mother   . Heart disease Father   . Heart failure Father      Review of Systems: All other systems reviewed and are otherwise negative except as noted above.  Physical Exam: Filed Vitals:   10/27/15 0300 10/27/15 0400 10/27/15 0500 10/27/15 0600  BP:  103/83  114/89  Pulse: 120 124  115  Temp:      TempSrc:      Resp: 20 19  19   Height:      Weight:   207 lb 10.8 oz (94.2 kg)   SpO2: 90% 94%  90%    GEN- The patient is well appearing, alert and oriented x 3 today.   HEENT: normocephalic, atraumatic;  sclera clear, conjunctiva pink; hearing intact; oropharynx clear; neck supple, no JVP Lymph- no cervical lymphadenopathy Lungs- Clear to ausculation bilaterally, normal work of breathing.  No wheezes, rales, rhonchi Heart- Regular rate and rhythm, tachycardic, no murmurs, rubs or gallops, PMI not laterally displaced GI- soft, non-tender, non-distended, bowel sounds present Extremities- no clubbing, cyanosis, or edema; DP/PT/radial pulses 2+ bilaterally  MS- no significant deformity or atrophy Skin- warm and dry, no rash or lesion Psych- euthymic mood, full affect Neuro- no gross deficits observed  Labs:   Lab Results  Component Value Date   WBC 10.4 10/26/2015   HGB 16.0 10/26/2015   HCT 47.8 10/26/2015   MCV 91.4 10/26/2015   PLT 172 10/26/2015    Recent Labs Lab 10/27/15 0650  NA 137  K 4.2  CL 104  CO2 23  BUN 26*  CREATININE 1.52*  CALCIUM 9.0  GLUCOSE 108*      Radiology/Studies:  Dg Chest 2 View 10/26/2015  CLINICAL DATA:  Chest pain since last evening. EXAM: CHEST  2 VIEW COMPARISON:  10/19/2015 FINDINGS: The heart is mildly enlarged but stable. Stable pacer wires. Stable surgical changes from mitral valve replacement surgery. Mild chronic lung changes but no acute pulmonary findings. No pleural effusions. The bony thorax is intact. IMPRESSION: Stable mild cardiac enlargement. Chronic lung changes but no acute pulmonary findings. Electronically Signed   By: Marijo Sanes M.D.   On: 10/26/2015 19:53    EKG: WCT appears to be the same VT as his previous EKGs TELEMETRY: tacycardic 115-120 08/23/15: Echocardiogram Study Conclusions - Left ventricle: The cavity size was moderately dilated. Wall  thickness was normal. Systolic function was moderately to  severely reduced. The estimated ejection fraction was in the  range of 30% to 35%. Diffuse hypokinesis. The study is not  technically sufficient to allow evaluation of LV diastolic  function. - Aortic valve:  Trileaflet. Sclerosis without stenosis. There was  no regurgitation. - Mitral valve: Tilting bileaflet mechanical valve without  obstruction. Normal bileaflet motion. Trivial regurgitation. Mean  gradient (D): 4 mm Hg. Valve area by pressure half-time: 1.85  cm^2. Valve area by continuity equation (using LVOT flow): 1.54  cm^2. - Left atrium: Severely dilated at 58 ml/m2. - Right ventricle: The cavity size was moderately dilated. Systolic  function is reduced. Lateral annulus peak S velocity: 8.5 cm/s. - Right atrium: Severely dilated at 29 cm2. - Tricuspid valve: There was mild regurgitation. - Pulmonary arteries: PA peak pressure: 32 mm Hg (S) + RAP. - Systemic veins: IVC not visualized. Impressions: - Compared to a prior study in 2012, the EF is lower at 30-35% -  moderate dilation with normal wall thickness and global  hypokinesis. The RV is also dilated with reduced systolic  function. The mechanical mitral valve is functioning normally  without obstruction. There is severe biatrial enlargement.   09/15/15: VT ablation, Dr. Lovena [name] Conclusion: Successful catheter ablation of 2 distinct ventricular tachycardias originating from the mitral valve annulus which appeared to correlate with the patient's clinical ventricular tachycardia. Unsuccessful ablation of the inducible ventricular tachycardia originating from the septal portion of the mitral valve annulus as previously noted above.   DEVICE HISTORY: MDT CRT-D, 08/05/11 Dr. Lovena [name], original device explanted Nov 2012 secondary to infection    Assessment and Plan:   1. WCT/VT, NICM     Rates 115-120 range     He has been given IV metoprolol     Will attempt to  Pace terminate his arrhythmia, pending Dr. Lovena [name] to see the patient     BP is stable  2. DOE, pt reports 3lb weight gain overnight at home     Elevated pBNP     S/p lasix     Exam appears compensated  3. CP     Not active     Neg Trop x2     Had  been  planned for out patient ischemic w/u  4. Mechanical MVR     Coumadin is therapeutic 2.76     Pharmacy to manage in-patient  5. PAFib     CHA2DS2vasc is at least 2     On Coumadin   Signed, Tommye Standard, PA-C 10/27/2015 7:54 AM  EP Attending  Patient seen and examined. Agree with the findings as noted above. The patient was in sustained VT at 115/min since yesterday. He is sob and has chest pressure.   A/P 1. VT 2. Mechanical mitral valve 3. Complete heart block 4. Persistent atrial fib/flutter despite amiodarone  Rec: He continues to have VT despite amio, mexitil, metoprolol and VT ablation. We are limited by his mechanical mitral valve. I have paced the patient back to NSR today by ATP him at 320 ms. I have discussed the options with his wife with regard to repeat ablation, here vs elsewhere.  [name], M.D.

## 2015-10-27 NOTE — Progress Notes (Signed)
Notified Austin Standard PA that pt requesting diet order. Pt to remain npo for now.

## 2015-10-27 NOTE — H&P (Signed)
History & Physical    Patient ID: Austin [name] MRN: [id number], DOB/AGE: 73-Aug-1942  Admit date: 10/26/2015 Primary Physician: Mathews Argyle, MD Primary Cardiologist:  [name]  History of Present Illness    75 yo M pt of Dr. [name] w a h/o NICM s/p CRT-D, VHD with STJ mechanical MVR (2000), persistent AFib, & VT from 4 different sources per EPS 09/15/15 (2 successfully ablated) recently started on Mexiletine during a recent admission 10/19/15 to 10/22/15 for arrhythmia attributed to slow VT p/w similar symptoms of prior VT - dyspnea, palpitations,lightheadedness, & mild chest discomfort that started upon wakening 10/26/15.  He called the on-call number & received a prescription for Furosemide.  He took a single dose but decided to proceed with seeking evaluation due to the level of discomfort.  He is continuing to experience these symptoms, though greatly improved in the ED following administration of Metoprolol 5 mg IV x 1.  He noted that with numerous admissions recently for arrhythmia, his physical activity has significantly declined, previously able to play tennis & golf, he has not done so since 07/2015.  Per review of his records, he has not previously tolerated doses of Metoprolol higher than his current dose due to significant fatigue.  Also, per review of his records, he has not previously responded to pacing of his VT.  Remote interrogation of his device tonight reported SVT, but my interrogation raises concern for 1:1 V to A conduction.  He denied syncope, diaphoresis, nausea, lower extremity swelling, orthopnea, or paroxysmal nocturnal dyspnea.    Past Medical History   Past Medical History  Diagnosis Date  . Nonischemic cardiomyopathy ([place])   . 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 Psychosexual dysfunction with inhibited sexual excitement   . Conductive hearing loss   . Syncope and  collapse   . Atrial fibrillation ([place])   . Intraspinal abscess   . Mitral valve insufficiency and aortic valve insufficiency     s/p MVR mechanical  . S/P mitral valve replacement   . Unspecified sleep apnea     last sleep study 11/07  . Ventricular tachycardia [facility])     Past Surgical History  Procedure Laterality Date  . Mitral valve replacement      w #[address] [facility]  . Valve replacement  2000  . Evacution of epidural lumbar epidural abscess  1999  . Thyroidectomy    . Tonsillectomy    . Dental implants    . Cardiac catheterization  04/24/2002  . Insert / replace / remove pacemaker  11/2008  . Cardioversion  03/09/2012    Procedure: CARDIOVERSION;  Surgeon: Evans Lance, MD;  Location: [facility];  Service: Cardiovascular;  Laterality: N/A;  . Cardioversion N/A 11/22/2013    Procedure: CARDIOVERSION;  Surgeon: Sanda Klein, MD;  Location: [place] ENDOSCOPY;  Service: Cardiovascular;  Laterality: N/A;  . Permanent pacemaker insertion N/A 08/05/2011    Procedure: PERMANENT PACEMAKER INSERTION;  Surgeon: Evans Lance, MD;  Location: [facility] CATH LAB;  Service: Cardiovascular;  Laterality: N/A;  . Electrophysiologic study N/A 09/15/2015    Procedure: Stephanie Coup Ablation;  Surgeon: Evans Lance, MD;  Location: [place] CV LAB;  Service: Cardiovascular;  Laterality: N/A;  . Cardiac valve replacement      Allergies  No Known Allergies  Home Medications    Prior to Admission medications   Medication Sig Start Date End Date Taking? Authorizing  Provider  acetaminophen (TYLENOL) 500 MG tablet Take 1,000 mg by mouth every 6 (six) hours as needed (pain).   Yes Historical Provider, MD  amiodarone (PACERONE) 200 MG tablet Take 2 tablets (400 mg total) by mouth daily. 08/25/15  Yes Renee Dyane Dustman, PA-C  folic acid (FOLVITE) 1 MG tablet Take 1 mg by mouth at bedtime.    Yes Historical Provider, MD  furosemide (LASIX) 40 MG tablet Take 1 tablet (40 mg total) by mouth daily. 10/26/15 10/28/15 Yes Luke K  Kilroy, PA-C  levothyroxine (SYNTHROID, LEVOTHROID) 75 MCG tablet Take 1 tablet (75 mcg total) by mouth daily. Patient taking differently: Take 75 mcg by mouth daily before breakfast.  11/18/14  Yes Evans Lance, MD  lisinopril (PRINIVIL,ZESTRIL) 10 MG tablet Take 10 mg by mouth at bedtime.  05/17/15  Yes Historical Provider, MD  metoprolol tartrate (LOPRESSOR) 25 MG tablet Take 0.5 tablets (12.5 mg total) by mouth 2 (two) times daily. 10/22/15  Yes Renee Dyane Dustman, PA-C  mexiletine (MEXITIL) 150 MG capsule Take 1 capsule (150 mg total) by mouth 3 (three) times daily. 10/22/15  Yes Renee Dyane Dustman, PA-C  Multiple Vitamin (MULTIVITAMIN WITH MINERALS) TABS tablet Take 1 tablet by mouth daily.   Yes Historical Provider, MD  Omega-3 Fatty Acids (FISH OIL) 1000 MG CAPS Take 1,000 mg by mouth at bedtime.    Yes Historical Provider, MD  warfarin (COUMADIN) 5 MG tablet Take 0.5-1 tablets (2.5-5 mg total) by mouth at bedtime. Take 1/2 tablet (2.5 mg) by mouth on Tuesday, Thursday,  take 1 tablet (5 mg) on all other days 10/22/15  Yes Renee Dyane Dustman, PA-C   Family History    Family History  Problem Relation Age of Onset  . Heart disease Mother   . Heart failure Mother   . Heart disease Father   . Heart failure Father    Social History    Social History   Social History  . Marital Status: Married    Spouse Name: N/A  . Number of Children: N/A  . Years of Education: N/A   Occupational History  . Not on file.   Social History Main Topics  . Smoking status: Never Smoker   . Smokeless tobacco: Not on file  . Alcohol Use: Yes     Comment: occasionally  . Drug Use: No  . Sexual Activity: Not on file   Other Topics Concern  . Not on file   Social History Narrative    Review of Systems    General:  No chills, fever, night sweats or weight changes.  Cardiovascular:  Positive for chest pain, dyspnea, palpitations.  No orthopnea or paroxysmal nocturnal dyspnea. Dermatological: No rash,  lesions/masses Respiratory: Positive for dyspnea.  No cough.   Urologic: No hematuria, dysuria Abdominal:   No nausea, vomiting, diarrhea, bright red blood per rectum, melena, or hematemesis Neurologic:  No visual changes, wkns, changes in mental status. All other systems reviewed and are otherwise negative except as noted above.  Physical Exam    Blood pressure 101/82, pulse 115, temperature 97.8 F (36.6 C), temperature source Oral, resp. rate 22, height 6\' 2"  (1.88 m), weight 94.5 kg (208 lb 5.4 oz), SpO2 96 %.  General: Pleasant, NAD Psych: Normal affect. Neuro: Alert and oriented X 3. Moves all extremities spontaneously. HEENT: Normal  Neck: JVD to 2 cm above the clavicle at 45 degrees. Lungs:  Minimal rales bilateral lower lobes. Heart: RRR no s3, s4, or murmurs. Abdomen: Soft, non-tender,  non-distended, BS + x 4.  Extremities: Trace PE b/l LE. DP/PT/Radials 2+ and equal bilaterally.  Labs    Troponin [facility] of Care Test)  Recent Labs  10/26/15 1924  TROPIPOC 0.04   No results for input(s): CKTOTAL, CKMB, TROPONINI in the last 72 hours. Lab Results  Component Value Date   WBC 10.4 10/26/2015   HGB 16.0 10/26/2015   HCT 47.8 10/26/2015   MCV 91.4 10/26/2015   PLT 172 10/26/2015    Recent Labs Lab 10/26/15 1918  NA 136  K 4.3  CL 104  CO2 23  BUN 25*  CREATININE 1.43*  CALCIUM 9.3  GLUCOSE 143*   Lab Results  Component Value Date   CHOL 158 08/23/2015   HDL 50 08/23/2015   LDLCALC 82 08/23/2015   TRIG 130 08/23/2015   No results found for: [facility]   Radiology Studies    Dg Chest 2 View  10/26/2015  CLINICAL DATA:  Chest pain since last evening. EXAM: CHEST  2 VIEW COMPARISON:  10/19/2015 FINDINGS: The heart is mildly enlarged but stable. Stable pacer wires. Stable surgical changes from mitral valve replacement surgery. Mild chronic lung changes but no acute pulmonary findings. No pleural effusions. The bony thorax is intact. IMPRESSION: Stable mild  cardiac enlargement. Chronic lung changes but no acute pulmonary findings. Electronically Signed   By: Marijo Sanes M.D.   On: 10/26/2015 19:53   Dg Chest 2 View  10/19/2015  CLINICAL DATA:  Tachycardia, shortness of breath, left chest pain EXAM: CHEST  2 VIEW COMPARISON:  08/22/2015 FINDINGS: Lungs are clear.  No pleural effusion or pneumothorax. Heart is top-normal in size. Prostatic valve. Right subclavian pacemaker. Mild degenerative changes of the visualized thoracolumbar spine. Median sternotomy. IMPRESSION: No evidence of acute cardiopulmonary disease. Electronically Signed   By: Julian Hy M.D.   On: 10/19/2015 16:19   Assessment & Plan    A/P:  75 yo M w a h/o NICM s/p CRT-D, VHD with STJ mechanical MVR (2000), persistent AFib, & VT from 4 different sources per EPS 09/15/15 (2 successfully ablated) recently started on Mexiletine during a recent admission 10/19/15 to 10/22/15 for arrhythmia attributed to slow VT p/w similar symptoms of prior VT.  # Tachyarrhythmia - This is most likely consistent with recurrent VT, which has been difficult to treat.  With tonight's episode, given his current stability, will refrain from attempts at pace-terminating overnight in case her were to decompensate without backup available.  His EKG morphology appears similar to when he presented with slow VT 10/19/15.   - Given the refractory nature of his VT, repeat ischemic evaluation may be warranted at this point as the last I am able to see is from 2003. - As he responded well to Lopressor 5 mg IV given in the ED, will continue with small scheduled doses. - In the meantime, will continue his Amiodarone & Mexiletine.   - Otherwise, he is appropriate anticoagulated with Warfarin for his Afib.    # h/o NICM - Appears minimally volume overloaded, which is not surprising with his current arrhythmia. - Will give a single dose of IV Lasix & continue to monitor. - Otherwise, will continue his current Metoprolol &  Lisinopril for now.  # h/o Mechanical MV - Appropriately anticoagulated.  Gradient stable from 2012 to 2017. - Agree with his current Warfarin.    # h/o Hypothyroidism - TSH wnl 10/14/15.   Continue his current Levothyroxine.    # PPX - PPI, Warfarin  #  Full code  Signed, Alfonso Ramus, MD 10/27/2015, 3:10 AM

## 2015-10-27 NOTE — Progress Notes (Addendum)
ANTICOAGULATION CONSULT NOTE - Initial Consult  Pharmacy Consult for warfarin Indication: history of afib and mechanical MVR ([facility])  No Known Allergies  Patient Measurements: Height: 6\' 2"  (188 cm) Weight: 207 lb 10.8 oz (94.2 kg) IBW/kg (Calculated) : 82.2   Vital Signs: Temp: 97.6 F (36.4 C) (04/03 0754) Temp Source: Oral (04/03 0754) BP: 115/79 mmHg (04/03 1100) Pulse Rate: 60 (04/03 1100)  Labs:  Recent Labs  10/26/15 1918 10/27/15 0650  HGB 16.0  --   HCT 47.8  --   PLT 172  --   LABPROT 28.7*  --   INR 2.76*  --   CREATININE 1.43* 1.52*  TROPONINI  --  0.08*    Estimated Creatinine Clearance: 51.1 mL/min (by C-G formula based on Cr of 1.52).   Medical History: Past Medical History  Diagnosis Date  . Nonischemic cardiomyopathy ([place])   . 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 Psychosexual dysfunction with inhibited sexual excitement   . Conductive hearing loss   . Syncope and collapse   . Atrial fibrillation ([place])   . Intraspinal abscess   . Mitral valve insufficiency and aortic valve insufficiency     s/p MVR mechanical  . S/P mitral valve replacement   . Unspecified sleep apnea     last sleep study 11/07  . Ventricular tachycardia [facility])     Assessment: 75 year old male with recent admission for VT storm, 2/4 of the sources were successfully ablated. He returns to [facility] this am after having dyspnea, palpitations, lightheadedness and mild chest pain yesterday. Noted he has a history of afib and mechanical MVR and takes warfarin as an outpatient.   INR on admit was within goal range at 2.7. No labs this am will check INR today and continue home dosing as appropriate.  Warfarin pta dose is 5mg  daily except 2.5mg  on Tuesday and Thursday.  Goal of Therapy:  INR goal 3-3.5 per outpatient records Monitor platelets by anticoagulation protocol: Yes   Plan:  Check INR  now Continue home dose of warfarin if INR within goal range  Erin Hearing PharmD., BCPS Clinical Pharmacist Pager [phone] 10/27/2015 11:21 AM  Noted outpatient range for INR was 3-3.5. INR this am is just below goal but will continue current home dose as he is scheduled for 5mg  tonight. Follow up INR in am.  10/27/2015 2:15 PM

## 2015-10-27 NOTE — Discharge Instructions (Addendum)
Keep your appointment for coumadin clinic Friday 10/31/15  NO DRIVING 6 months    Information on my medicine - Coumadin   (Warfarin)  Why was Coumadin prescribed for you? Coumadin was prescribed for you because you have a blood clot or a medical condition that can cause an increased risk of forming blood clots. Blood clots can cause serious health problems by blocking the flow of blood to the heart, lung, or brain. Coumadin can prevent harmful blood clots from forming. As a reminder your indication for Coumadin is:   Blood Clot Prevention After Heart Valve Surgery  What test will check on my response to Coumadin? While on Coumadin (warfarin) you will need to have an INR test regularly to ensure that your dose is keeping you in the desired range. The INR (international normalized ratio) number is calculated from the result of the laboratory test called prothrombin time (PT).  If an INR APPOINTMENT HAS NOT ALREADY BEEN MADE FOR YOU please schedule an appointment to have this lab work done by your health care provider within 7 days. Your INR goal is usually a number between:  2 to 3 or your provider may give you a more narrow range like 2-2.5.  Ask your health care provider during an office visit what your goal INR is.  What  do you need to  know  About  COUMADIN? Take Coumadin (warfarin) exactly as prescribed by your healthcare provider about the same time each day.  DO NOT stop taking without talking to the doctor who prescribed the medication.  Stopping without other blood clot prevention medication to take the place of Coumadin may increase your risk of developing a new clot or stroke.  Get refills before you run out.  What do you do if you miss a dose? If you miss a dose, take it as soon as you remember on the same day then continue your regularly scheduled regimen the next day.  Do not take two doses of Coumadin at the same time.  Important Safety Information A possible side effect of  Coumadin (Warfarin) is an increased risk of bleeding. You should call your healthcare provider right away if you experience any of the following: ? Bleeding from an injury or your nose that does not stop. ? Unusual colored urine (red or dark brown) or unusual colored stools (red or black). ? Unusual bruising for unknown reasons. ? A serious fall or if you hit your head (even if there is no bleeding).  Some foods or medicines interact with Coumadin (warfarin) and might alter your response to warfarin. To help avoid this: ? Eat a balanced diet, maintaining a consistent amount of Vitamin K. ? Notify your provider about major diet changes you plan to make. ? Avoid alcohol or limit your intake to 1 drink for women and 2 drinks for men per day. (1 drink is 5 oz. wine, 12 oz. beer, or 1.5 oz. liquor.)  Make sure that ANY health care provider who prescribes medication for you knows that you are taking Coumadin (warfarin).  Also make sure the healthcare provider who is monitoring your Coumadin knows when you have started a new medication including herbals and non-prescription products.  Coumadin (Warfarin)  Major Drug Interactions  Increased Warfarin Effect Decreased Warfarin Effect  Alcohol (large quantities) Antibiotics (esp. Septra/Bactrim, Flagyl, Cipro) Amiodarone (Cordarone) Aspirin (ASA) Cimetidine (Tagamet) Megestrol (Megace) NSAIDs (ibuprofen, naproxen, etc.) Piroxicam (Feldene) Propafenone (Rythmol SR) Propranolol (Inderal) Isoniazid (INH) Posaconazole (Noxafil) Barbiturates (Phenobarbital) Carbamazepine (Tegretol) Chlordiazepoxide (  Librium) Cholestyramine (Questran) Griseofulvin Oral Contraceptives Rifampin Sucralfate (Carafate) Vitamin K   Coumadin (Warfarin) Major Herbal Interactions  Increased Warfarin Effect Decreased Warfarin Effect  Garlic Ginseng Ginkgo biloba Coenzyme Q10 Green tea St. Johns wort    Coumadin (Warfarin) FOOD Interactions  Eat a consistent  number of servings per week of foods HIGH in Vitamin K (1 serving =  cup)  Collards (cooked, or boiled & drained) Kale (cooked, or boiled & drained) Mustard greens (cooked, or boiled & drained) Parsley *serving size only =  cup Spinach (cooked, or boiled & drained) Swiss chard (cooked, or boiled & drained) Turnip greens (cooked, or boiled & drained)  Eat a consistent number of servings per week of foods MEDIUM-HIGH in Vitamin K (1 serving = 1 cup)  Asparagus (cooked, or boiled & drained) Broccoli (cooked, boiled & drained, or raw & chopped) Brussel sprouts (cooked, or boiled & drained) *serving size only =  cup Lettuce, raw (green leaf, endive, romaine) Spinach, raw Turnip greens, raw & chopped   These websites have more information on Coumadin (warfarin):  FailFactory.se; VeganReport.com.au;

## 2015-10-28 DIAGNOSIS — I481 Persistent atrial fibrillation: Secondary | ICD-10-CM

## 2015-10-28 LAB — BASIC METABOLIC PANEL
ANION GAP: 11 (ref 5–15)
BUN: 27 mg/dL — AB (ref 6–20)
CALCIUM: 8.7 mg/dL — AB (ref 8.9–10.3)
CO2: 24 mmol/L (ref 22–32)
Chloride: 104 mmol/L (ref 101–111)
Creatinine, Ser: 1.55 mg/dL — ABNORMAL HIGH (ref 0.61–1.24)
GFR calc Af Amer: 50 mL/min — ABNORMAL LOW (ref >60)
GFR, EST NON AFRICAN AMERICAN: 43 mL/min — AB (ref >60)
GLUCOSE: 82 mg/dL (ref 65–99)
POTASSIUM: 3.9 mmol/L (ref 3.5–5.1)
SODIUM: 139 mmol/L (ref 135–145)

## 2015-10-28 LAB — PROTIME-INR
INR: 2.62 — AB (ref 0.00–1.49)
PROTHROMBIN TIME: 27.7 s — AB (ref 11.6–15.2)

## 2015-10-28 MED ORDER — WARFARIN SODIUM 7.5 MG PO TABS
7.5000 mg | ORAL_TABLET | Freq: Once | ORAL | Status: AC
  Administered 2015-10-28: 7.5 mg via ORAL
  Filled 2015-10-28: qty 1

## 2015-10-28 NOTE — Progress Notes (Signed)
Pt refuses CPAP for the night.  

## 2015-10-28 NOTE — Progress Notes (Signed)
Pt refusing CPAP because we do not have nasal pillows.

## 2015-10-28 NOTE — Progress Notes (Signed)
ANTICOAGULATION CONSULT NOTE  Pharmacy Consult for warfarin Indication: history of afib and mechanical MVR ([facility])  No Known Allergies  Patient Measurements: Height: 6\' 2"  (188 cm) Weight: 204 lb 11.2 oz (92.851 kg) IBW/kg (Calculated) : 82.2   Vital Signs: Temp: 97.5 F (36.4 C) (04/04 0720) Temp Source: Oral (04/04 0720) BP: 113/75 mmHg (04/04 0720) Pulse Rate: 78 (04/04 0428)  Labs:  Recent Labs  10/26/15 1918 10/27/15 0650 10/27/15 1209 10/27/15 1802 10/28/15 0243  HGB 16.0  --   --   --   --   HCT 47.8  --   --   --   --   PLT 172  --   --   --   --   LABPROT 28.7*  --  29.7*  --  27.7*  INR 2.76*  --  2.89*  --  2.62*  CREATININE 1.43* 1.52*  --   --   --   TROPONINI  --  0.08* 0.07* 0.08*  --     Estimated Creatinine Clearance: 51.1 mL/min (by C-G formula based on Cr of 1.52).   Medical History: Past Medical History  Diagnosis Date  . Nonischemic cardiomyopathy ([place])   . 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 Psychosexual dysfunction with inhibited sexual excitement   . Conductive hearing loss   . Syncope and collapse   . Atrial fibrillation ([place])   . Intraspinal abscess   . Mitral valve insufficiency and aortic valve insufficiency     s/p MVR mechanical  . S/P mitral valve replacement   . Unspecified sleep apnea     last sleep study 11/07  . Ventricular tachycardia [facility])     Assessment: 75 year old male with recent admission for VT storm, 2/4 of the sources were successfully ablated. He returns to [facility] this am after having dyspnea, palpitations, lightheadedness and mild chest pain yesterday. Noted he has a history of afib and mechanical MVR and takes warfarin as an outpatient.   INR on admit was within goal range at 2.7.   INR with slight trend down overnight to 2.6 will deviate from home dose and give a little extra tonight to get INR closer to 3.  Warfarin  pta dose is 5mg  daily except 2.5mg  on Tuesday and Thursday.  Goal of Therapy:  INR goal 3-3.5 per outpatient records Monitor platelets by anticoagulation protocol: Yes   Plan:  Warfarin 7.5mg  x1 tonight INR in am, if stable will go to check 3x/wk  Erin Hearing PharmD., BCPS Clinical Pharmacist Pager [phone] 10/28/2015 7:53 AM

## 2015-10-28 NOTE — Progress Notes (Signed)
SUBJECTIVE: The patient is doing well today.  At this time, he denies chest pain, shortness of breath, or any new concerns, expressed frustration with recurrent symptoms and VT, hospitalizations.  Marland Kitchen amiodarone  400 mg Oral Daily  . antiseptic oral rinse  7 mL Mouth Rinse BID  . folic acid  1 mg Oral QHS  . levothyroxine  75 mcg Oral QAC breakfast  . lisinopril  10 mg Oral QHS  . metoprolol tartrate  12.5 mg Oral BID  . mexiletine  300 mg Oral TID  . sodium chloride flush  3 mL Intravenous Q12H  . warfarin  7.5 mg Oral ONCE-1800  . Warfarin - Physician Dosing Inpatient   Does not apply q1800      OBJECTIVE: Physical Exam: Filed Vitals:   10/28/15 0033 10/28/15 0428 10/28/15 0720 10/28/15 0809  BP: 114/90 129/90 113/75   Pulse: 58 78    Temp: 98 F (36.7 C) 97.6 F (36.4 C) 97.5 F (36.4 C)   TempSrc: Oral Oral Oral   Resp: 19 26 16    Height:      Weight:  204 lb 11.2 oz (92.851 kg)    SpO2: 96% 97% 98% 93%    Intake/Output Summary (Last 24 hours) at 10/28/15 0917 Last data filed at 10/28/15 0544  Gross per 24 hour  Intake    700 ml  Output   1425 ml  Net   -725 ml    Telemetry reveals V. Paced, no further VT   GEN- The patient is well appearing, alert and oriented x 3 today.   Head- normocephalic, atraumatic Eyes-  Sclera clear, conjunctiva pink Ears- hearing intact Oropharynx- clear Neck- supple, no JVP Lungs- Clear to ausculation bilaterally, normal work of breathing Heart- Regular rate and rhythm, mechanical valve appreciated, no rubs or gallops GI- soft, NT, ND Extremities- no clubbing, cyanosis, or edema, chronic looking skin changes Skin- no rash or lesion Psych- euthymic mood, full affect Neuro- no gross deficits appreciated  LABS: Basic Metabolic Panel:  Recent Labs  10/26/15 1918 10/27/15 0650 10/28/15 0728  NA 136 137 139  K 4.3 4.2 3.9  CL 104 104 104  CO2 23 23 24   GLUCOSE 143* 108* 82  BUN 25* 26* 27*  CREATININE 1.43* 1.52*  1.55*  CALCIUM 9.3 9.0 8.7*  MG 2.1 2.0  --    CBC:  Recent Labs  10/26/15 1918  WBC 10.4  HGB 16.0  HCT 47.8  MCV 91.4  PLT 172   Cardiac Enzymes:  Recent Labs  10/27/15 0650 10/27/15 1209 10/27/15 1802  TROPONINI 0.08* 0.07* 0.08*      ASSESSMENT AND PLAN:   1. WCT/VT, NICM  pace terminated via his device yesterday by Dr. Lovena Le, has remained in paced rhythm     His Mexiletine was up-titrated to 300 mg tid  BP is stable  2. DOE, pt reports 3lb weight gain overnight at home  Elevated pBNP  S/p lasix  Exam appears compensated     Fluid neg -143ml     BMET stable  3. CP  Not active  Neg Trop x2  Had been planned for out patient ischemic w/u  4. Mechanical MVR  Coumadin is therapeutic 2.62  Pharmacy to manage in-patient  5. PAFib  CHA2DS2vasc is at least 2  On Coumadin   Lengthy discussion by Dr. Lovena Le with the patient today(and his wife yesterday)  regarding his VT and plan of care. Hopefully if no further VT for discharge  tomorrow.   Tommye Standard, PA-C 10/28/2015 9:17 AM  EP Attending  Patient seen and examined. Agree with the findings as noted above. His exam demonstrates a RRR with a mechanical S1. Soft systolic murmur. Lungs were clear. Extremities without edema and neuro is non-focal. Tele - atrial fib with ventricular pacing A/P 1. VT storm - he has been paced out of VT and his underlying rhythm is atrial fib with ventricular pacing. He will continue uptitration of Mexitil. Will anticipate discharge home tomorrow. I have had a lengthy discussion with the patient and his wife about repeat ablation. For now will see how he does with the added dose of Mexilitine. 2. Chronic systolic heart failure - he appears euvolemic. Will continue his current meds. 3. Atrial fib - this now appears to be chronic. His rate is controlled. Will follow. 4. Disp. - will plan to dc home tomorrow.  Mikle Bosworth.D.

## 2015-10-29 LAB — PROTIME-INR
INR: 3.69 — ABNORMAL HIGH (ref 0.00–1.49)
Prothrombin Time: 35.8 seconds — ABNORMAL HIGH (ref 11.6–15.2)

## 2015-10-29 MED ORDER — MEXILETINE HCL 150 MG PO CAPS: 300.0000 mg | ORAL_CAPSULE | Freq: Three times a day (TID) | ORAL | Status: DC

## 2015-10-29 MED ORDER — AMIODARONE HCL 200 MG PO TABS: 200.0000 mg | ORAL_TABLET | Freq: Two times a day (BID) | ORAL | Status: DC

## 2015-10-29 MED ORDER — WARFARIN SODIUM 2.5 MG PO TABS: 2.5000 mg | ORAL_TABLET | Freq: Once | ORAL | Status: DC

## 2015-10-29 NOTE — Discharge Summary (Signed)
DISCHARGE SUMMARY    Patient ID: Austin [name],  MRN: [id number], DOB/AGE: August 18, 1940 75 y.o.  Admit date: 10/26/2015 Discharge date: 10/29/2015  Primary Care Physician: Mathews Argyle, MD Primary Cardiologist: Dr. Tamala Julian Electrophysiologist: Dr. Lovena Le  Primary Discharge Diagnosis:  1. VT 2. NICM  Secondary Discharge Diagnosis:  1. VHD with mechanical MVR 2. persistent AFib     CHA2S2Vasc is at least 2     On warfarin, manages with coumadin clinic  No Known Allergies   Procedures This Admission:  1. 10/27/15, his VT was pace terminated via his ICD with Dr. Lovena Le  Brief HPI: Austin [name] is a 75 y.o. male was admitted to [facility]  10/26/15 again with weak with progressive weight gain and noting his HR >110 persistently came in again with WCT and ongoing VT with rates 110-120.  He was admitted to ICU for further care and management.  Hospital Course:  He had just discharged from [facility] 10/22/15 for VT storm eventually getting ICD therapy, was started on Mexitil in addition to his metoprolol and amiodarone. Saturday he again became very tired, weak and significant DOE with CP and felt his HR fast, and checking found it consistantly 110 or higher, at times 120's. He did not have ongoing CP, not SOB at rest.  He was given Lasix and has remained compensated by his exam, Dr. Lovena Le pace terminated his VT via his ICD 10/27/15 and his mexitil was up-titrated.  He has remained in a V paced rhythm for 48 hours on his current regime.  His BP has remained stable.  Lengthy discussions with Dr. Lovena Le the patient and his wife regarding the complexity of his case.  He will have early f/u with Dr. Lovena Le next week, his next INR check is scheduled for Friday.  His INR is 3.69 he will take 2.5mg  today and resume his regular home warfarin scheduled and further pending his coumadin clinic recommendations Friday.  The patient is aware of no driving for 6 months.   Physical Exam: Filed Vitals:   10/29/15 0400 10/29/15 0500 10/29/15 0734 10/29/15 0800  BP:  133/88 122/87   Pulse:  76 66   Temp:  98.7 F (37.1 C) 98.1 F (36.7 C)   TempSrc:  Oral Oral   Resp: 20 21 20 18   Height:      Weight:  203 lb 6.4 oz (92.262 kg)    SpO2:   93%     GEN- The patient is well appearing, alert and oriented x 3 today.   HEENT: normocephalic, atraumatic; sclera clear, conjunctiva pink; hearing intact; oropharynx clear Lungs- Clear to ausculation bilaterally, normal work of breathing.  No wheezes, rales, rhonchi Heart- Regular rate and rhythm, mechanical valve is appreciated, rubs or gallops, PMI not laterally displaced GI- soft, non-tender, non-distended, bowel sounds present Extremities- no clubbing, cyanosis, or edema MS- no significant deformity or atrophy Skin- warm and dry, no rash or lesion Psych- euthymic mood, full affect Neuro- no gross defecits  Labs:   Lab Results  Component Value Date   WBC 10.4 10/26/2015   HGB 16.0 10/26/2015   HCT 47.8 10/26/2015   MCV 91.4 10/26/2015   PLT 172 10/26/2015     Recent Labs Lab 10/28/15 0728  NA 139  K 3.9  CL 104  CO2 24  BUN 27*  CREATININE 1.55*  CALCIUM 8.7*  GLUCOSE 82    Discharge Medications:    Medication List    STOP taking these medications  furosemide 40 MG tablet  Commonly known as:  LASIX      TAKE these medications        acetaminophen 500 MG tablet  Commonly known as:  TYLENOL  Take 1,000 mg by mouth every 6 (six) hours as needed (pain).     amiodarone 200 MG tablet  Commonly known as:  PACERONE  Take 1 tablet (200 mg total) by mouth 2 (two) times daily.     Fish Oil 1000 MG Caps  Take 1,000 mg by mouth at bedtime.     folic acid 1 MG tablet  Commonly known as:  FOLVITE  Take 1 mg by mouth at bedtime.     levothyroxine 75 MCG tablet  Commonly known as:  SYNTHROID, LEVOTHROID  Take 1 tablet (75 mcg total) by mouth daily.     lisinopril 10 MG tablet  Commonly known as:   PRINIVIL,ZESTRIL  Take 10 mg by mouth at bedtime.     metoprolol tartrate 25 MG tablet  Commonly known as:  LOPRESSOR  Take 0.5 tablets (12.5 mg total) by mouth 2 (two) times daily.     mexiletine 150 MG capsule  Commonly known as:  MEXITIL  Take 2 capsules (300 mg total) by mouth 3 (three) times daily.     multivitamin with minerals Tabs tablet  Take 1 tablet by mouth daily.     warfarin 5 MG tablet  Commonly known as:  COUMADIN  Take 0.5-1 tablets (2.5-5 mg total) by mouth at bedtime. Take 1/2 tablet (2.5 mg) by mouth on Tuesday, Thursday,  take 1 tablet (5 mg) on all other days        Disposition: Home  Discharge Instructions    Diet - low sodium heart healthy    Complete by:  As directed      Increase activity slowly    Complete by:  As directed           Follow-up Information    Follow up with [name], MD On 11/03/2015.   Specialty:  Cardiology   Why:  10:15AM   Contact information:   [address] [address] [phone]       Duration of Discharge Encounter: Greater than 30 minutes including physician time.  AMMENDED to correct cosigner only  Signed, Tommye Standard, Hershal Coria 10/29/2015 10:32 AM    EP Attending  Patient seen and examined. Agree with above. I spent 30 minutes discussing the plans for discharge as well as what to expect and additional treatment options if he has more VT.   Mikle Bosworth.D.

## 2015-10-29 NOTE — Progress Notes (Signed)
DC orders received.  Patient stable with no S/S of distress.  Medication and discharge info reviewed with patient and patient's wife.  Patient DC home with wife. [place], Ardeth Sportsman

## 2015-10-29 NOTE — Progress Notes (Addendum)
[facility] for warfarin Indication: history of afib and mechanical MVR ([facility] jude)  Assessment: 75 year old male with recent admission for VT storm, 2/4 of the sources were successfully ablated. He returns to [facility] this am after having dyspnea, palpitations, lightheadedness and mild chest pain yesterday. Noted he has a history of afib and mechanical MVR and takes warfarin as an outpatient.   INR on admit was within goal range at 2.7. After slightly higher dose given last night his INR is now 3.6. Will give reduced dose this evening then continue home dose starting 4/6. Appears patient has appt time 4/7 with coag clinic across the street.  - I discussed this with him this morning.  Warfarin pta dose is 5mg  daily except 2.5mg  on Tuesday and Thursday.  Goal of Therapy:  INR goal 3-3.5 per outpatient records Monitor platelets by anticoagulation protocol: Yes   Plan:  Warfarin 2.5mg  x1 tonight then resume home tomorrow if discharged No Known Allergies  Patient Measurements: Height: 6\' 2"  (188 cm) Weight: 203 lb 6.4 oz (92.262 kg) IBW/kg (Calculated) : 82.2   Vital Signs: Temp: 98.1 F (36.7 C) (04/05 0734) Temp Source: Oral (04/05 0734) BP: 122/87 mmHg (04/05 0734) Pulse Rate: 66 (04/05 0734)  Labs:  Recent Labs  10/26/15 1918 10/27/15 0650 10/27/15 1209 10/27/15 1802 10/28/15 0243 10/28/15 0728 10/29/15 0221  HGB 16.0  --   --   --   --   --   --   HCT 47.8  --   --   --   --   --   --   PLT 172  --   --   --   --   --   --   LABPROT 28.7*  --  29.7*  --  27.7*  --  35.8*  INR 2.76*  --  2.89*  --  2.62*  --  3.69*  CREATININE 1.43* 1.52*  --   --   --  1.55*  --   TROPONINI  --  0.08* 0.07* 0.08*  --   --   --     Estimated Creatinine Clearance: 50.1 mL/min (by C-G formula based on Cr of 1.55).   Medical History: Past Medical History  Diagnosis Date  . Nonischemic cardiomyopathy ([place])   . Biventricular ICD (implantable cardiac  defibrillator) in place     cx by infection, explantation11/12 & reimplant 1/13  . Psychosexual dysfunction with inhibited sexual excitement   . Conductive hearing loss   . Syncope and collapse   . Atrial fibrillation ([place])   . Intraspinal abscess   . Mitral valve insufficiency and aortic valve insufficiency     s/p MVR mechanical  . S/P mitral valve replacement   . Unspecified sleep apnea     last sleep study 11/07  . Ventricular tachycardia ([place])     Erin Hearing PharmD., BCPS Clinical Pharmacist Pager [phone] 10/29/2015 9:05 AM

## 2015-10-31 ENCOUNTER — Ambulatory Visit (INDEPENDENT_AMBULATORY_CARE_PROVIDER_SITE_OTHER): Payer: Medicare Other | Admitting: Cardiovascular Disease

## 2015-10-31 DIAGNOSIS — Z5181 Encounter for therapeutic drug level monitoring: Secondary | ICD-10-CM

## 2015-10-31 LAB — POCT INR: INR: 4.6

## 2015-11-03 ENCOUNTER — Ambulatory Visit (INDEPENDENT_AMBULATORY_CARE_PROVIDER_SITE_OTHER): Payer: Medicare Other | Admitting: Internal Medicine

## 2015-11-03 ENCOUNTER — Encounter: Payer: Self-pay | Admitting: Internal Medicine

## 2015-11-03 VITALS — BP 104/76 | HR 84 | Ht 74.0 in | Wt 207.8 lb

## 2015-11-03 DIAGNOSIS — I5022 Chronic systolic (congestive) heart failure: Secondary | ICD-10-CM

## 2015-11-03 DIAGNOSIS — I472 Ventricular tachycardia, unspecified: Secondary | ICD-10-CM

## 2015-11-03 MED ORDER — FUROSEMIDE 20 MG PO TABS: ORAL_TABLET | ORAL | Status: DC

## 2015-11-03 NOTE — Progress Notes (Signed)
HPI [name] returns today for followup. He is a pleasant 75 yo man with a non-ischemic CM, chronic systolic heart failure, atrial fib, and VT. He is s/p ICD implant and s/p VT ablation several weeks ago.  He has had no syncope. He was hospitalized several weeks ago and then last week with VT storm. His VT was in the 145 range and then last week in the 115 range. He was given an additional dose of amio and mexilitine and returns for additional followup. He feels better but he has had some heart failure symptoms.    No Known Allergies   Current Outpatient Prescriptions  Medication Sig Dispense Refill  . acetaminophen (TYLENOL) 500 MG tablet Take 1,000 mg by mouth every 6 (six) hours as needed (pain).    Marland Kitchen amiodarone (PACERONE) 200 MG tablet Take 1 tablet (200 mg total) by mouth 2 (two) times daily. 60 tablet 3  . folic acid (FOLVITE) 1 MG tablet Take 1 mg by mouth at bedtime.     Marland Kitchen levothyroxine (SYNTHROID, LEVOTHROID) 75 MCG tablet Take 75 mcg by mouth daily before breakfast.    . lisinopril (PRINIVIL,ZESTRIL) 10 MG tablet Take 10 mg by mouth at bedtime.   9  . metoprolol tartrate (LOPRESSOR) 25 MG tablet Take 0.5 tablets (12.5 mg total) by mouth 2 (two) times daily. 180 tablet 3  . mexiletine (MEXITIL) 150 MG capsule Take 2 capsules (300 mg total) by mouth 3 (three) times daily. 180 capsule 3  . Multiple Vitamin (MULTIVITAMIN WITH MINERALS) TABS tablet Take 1 tablet by mouth daily.    . Omega-3 Fatty Acids (FISH OIL) 1000 MG CAPS Take 1,000 mg by mouth at bedtime.     Marland Kitchen warfarin (COUMADIN) 5 MG tablet Take 0.5-1 tablets (2.5-5 mg total) by mouth at bedtime. Take 1/2 tablet (2.5 mg) by mouth on Tuesday, Thursday,  take 1 tablet (5 mg) on all other days    . furosemide (LASIX) 20 MG tablet Take as needed for weight over 205 30 tablet 3   No current facility-administered medications for this visit.     Past Medical History  Diagnosis Date  . Nonischemic cardiomyopathy ([place])   .  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 Psychosexual dysfunction with inhibited sexual excitement   . Conductive hearing loss   . Syncope and collapse   . Atrial fibrillation ([place])   . Intraspinal abscess   . Mitral valve insufficiency and aortic valve insufficiency     s/p MVR mechanical  . S/P mitral valve replacement   . Unspecified sleep apnea     last sleep study 11/07  . Ventricular tachycardia ([place])     ROS:   All systems reviewed and negative except as noted in the HPI.   Past Surgical History  Procedure Laterality Date  . Mitral valve replacement      w #[address] [facility]  . Valve replacement  2000  . Evacution of epidural lumbar epidural abscess  1999  . Thyroidectomy    . Tonsillectomy    . Dental implants    . Cardiac catheterization  04/24/2002  . Insert / replace / remove pacemaker  11/2008  . Cardioversion  03/09/2012    Procedure: CARDIOVERSION;  Surgeon: Evans Lance, MD;  Location: [facility];  Service: Cardiovascular;  Laterality: N/A;  . Cardioversion N/A 11/22/2013    Procedure: CARDIOVERSION;  Surgeon: Sanda Klein, MD;  Location: [facility]  ENDOSCOPY;  Service: Cardiovascular;  Laterality: N/A;  . Permanent pacemaker insertion N/A 08/05/2011    Procedure: PERMANENT PACEMAKER INSERTION;  Surgeon: Evans Lance, MD;  Location[facility] CATH LAB;  Service: Cardiovascular;  Laterality: N/A;  . Electrophysiologic study N/A 09/15/2015    Procedure: Stephanie Coup Ablation;  Surgeon: Evans Lance, MD;  Location: [place] CV LAB;  Service: Cardiovascular;  Laterality: N/A;  . Cardiac valve replacement       Family History  Problem Relation Age of Onset  . Heart disease Mother   . Heart failure Mother   . Heart disease Father   . Heart failure Father      Social History   Social History  . Marital Status: Married    Spouse Name: N/A  . Number of Children: N/A  . Years of Education: N/A    Occupational History  . Not on file.   Social History Main Topics  . Smoking status: Never Smoker   . Smokeless tobacco: Not on file  . Alcohol Use: Yes     Comment: occasionally  . Drug Use: No  . Sexual Activity: Not on file   Other Topics Concern  . Not on file   Social History Narrative     BP 104/76 mmHg  Pulse 84  Ht 6\' 2"  (1.88 m)  Wt 207 lb 12.8 oz (94.257 kg)  BMI 26.67 kg/m2  SpO2 91%  Physical Exam:  Well appearing 75 year old man, NAD HEENT: Unremarkable Neck:  7 cm JVD, no thyromegally Back:  No CVA tenderness Lungs:  Clear with no wheezes, rales, or rhonchi.  Well-healed ICD incision HEART:  Regular rate rhythm, soft systolic murmur, no rubs, no clicks, mechanical S1. Abd:  soft, positive bowel sounds, no organomegally, no rebound, no guarding Ext:  2 plus pulses, trace peripheral edema, no cyanosis, no clubbing Skin:  No rashes no nodules Neuro:  CN II through XII intact, motor grossly intact  ECG - atrial fib with ventricular pacing  DEVICE  Normal device function.  See PaceArt for details.   Assess/Plan: 1. VT - he has had recurrent VT and is now on higher dose of amio in conjunction with Mexilitine. I have carefully discussed the treatment options. We will attempt to arrange ablation down in [place]. 2. Chronic systolic heart failure - his symptoms are slightly worse with AV pacing (RV only). Today we have reprogrammed the device to pace in the LV as well at the expense of battery longevity. 3. BiV ICD - his device is working normally. Interogation demonstrates no recurrent VT. His LV lead was turned back on today at higher output (5 volts) 4. Atrial fib - he will continue his current meds. 5. Anti-coag - with his mechanical mitral valve, will continue coumadin.   Mikle Bosworth.D.

## 2015-11-03 NOTE — Patient Instructions (Signed)
Medication Instructions:  Your physician has recommended you make the following change in your medication:  1) Start Furosemide 20 mg if weight over 205    Labwork: None ordered   Testing/Procedures: None ordered   Follow-Up: Your physician recommends that you schedule a follow-up appointment in: 3 months with Dr Lovena Le     Any Other Special Instructions Will Be Listed Below (If Applicable).     If you need a refill on your cardiac medications before your next appointment, please call your pharmacy.

## 2015-11-07 ENCOUNTER — Ambulatory Visit (INDEPENDENT_AMBULATORY_CARE_PROVIDER_SITE_OTHER): Payer: Medicare Other | Admitting: Pharmacist

## 2015-11-07 DIAGNOSIS — Z5181 Encounter for therapeutic drug level monitoring: Secondary | ICD-10-CM

## 2015-11-07 LAB — POCT INR: INR: 3.4

## 2015-11-14 ENCOUNTER — Ambulatory Visit (INDEPENDENT_AMBULATORY_CARE_PROVIDER_SITE_OTHER): Payer: Medicare Other | Admitting: Cardiology

## 2015-11-14 DIAGNOSIS — Z5181 Encounter for therapeutic drug level monitoring: Secondary | ICD-10-CM

## 2015-11-14 LAB — POCT INR: INR: 3.8

## 2015-11-17 ENCOUNTER — Telehealth: Payer: Self-pay | Admitting: Internal Medicine

## 2015-11-17 MED ORDER — ONDANSETRON HCL 4 MG PO TABS: 2.0000 mg | ORAL_TABLET | Freq: Three times a day (TID) | ORAL | Status: DC | PRN

## 2015-11-17 NOTE — Telephone Encounter (Signed)
Calling to see if Dr Lovena Le has heard anything from Dr. Ned Clines in [place].  Dr Lovena Le is going to call again today. Also he is having problems with nausea and the Mexiletine.  Dr Lovena Le advised to start  Zofran 2 mg tid with Mexiletine This has been called in and wife is aware.

## 2015-11-17 NOTE — Telephone Encounter (Signed)
New Message  Pt wife called. States that it is a long message would rather relay it directly to the nurse. Please call

## 2015-11-20 ENCOUNTER — Telehealth: Payer: Self-pay | Admitting: Internal Medicine

## 2015-11-20 NOTE — Telephone Encounter (Signed)
Spoke with patient's wife and he feels terrible on the Mexiletine.  He has been taking the Zofran and this is not helping.  He is continuing to loose weight.  His wife said he says he is going to stop but she has talked him into taking 1 tablet tid.  I have discussed with Dr Caryl Comes in Dr Forde Dandy absence.  Okay to try this for now.  He has been contacted by MD in [place] and we are going to send our records.  His wife and I will touch base tomorrow to see how he is feeling with decreased dose

## 2015-11-20 NOTE — Telephone Encounter (Signed)
Pt's wife requesting a call from Claiborne Billings has been talking to her re patient and needs to talk to her again-said message to long to give to me

## 2015-11-20 NOTE — Telephone Encounter (Signed)
Pt wife calling back- stated home number not working, please use mobile: [phone]

## 2015-11-21 ENCOUNTER — Ambulatory Visit (INDEPENDENT_AMBULATORY_CARE_PROVIDER_SITE_OTHER): Payer: Medicare Other | Admitting: Internal Medicine

## 2015-11-21 DIAGNOSIS — Z5181 Encounter for therapeutic drug level monitoring: Secondary | ICD-10-CM

## 2015-11-21 LAB — POCT INR: INR: 2.1

## 2015-11-25 NOTE — Telephone Encounter (Signed)
Discussed with Dr Lovena Le, will most likely need to decrease the Mexiletine to 150 mg tid if the current dose of 150/150/300 is still causing a problem.  Spoke with his wife and he has been better since yesterday.  She will let me know if anything changes.  His records have been faxed to [facility].

## 2015-11-28 ENCOUNTER — Ambulatory Visit (INDEPENDENT_AMBULATORY_CARE_PROVIDER_SITE_OTHER): Payer: Medicare Other | Admitting: Cardiovascular Disease

## 2015-11-28 DIAGNOSIS — Z5181 Encounter for therapeutic drug level monitoring: Secondary | ICD-10-CM

## 2015-11-28 LAB — POCT INR: INR: 5.3

## 2015-12-03 ENCOUNTER — Telehealth: Payer: Self-pay | Admitting: Internal Medicine

## 2015-12-03 NOTE — Telephone Encounter (Signed)
His records are still not at [facility])  Maudie Mercury as spoken with Dr Hinton Rao office today and sent 51 pages.  We will have all these records out front for him to pick up today  Patient aware

## 2015-12-03 NOTE — Telephone Encounter (Signed)
New message   Patient verbalized the message will be too long - will disclose to the nurse when she called.

## 2015-12-05 ENCOUNTER — Ambulatory Visit (INDEPENDENT_AMBULATORY_CARE_PROVIDER_SITE_OTHER): Payer: Medicare Other | Admitting: Cardiovascular Disease

## 2015-12-05 DIAGNOSIS — Z5181 Encounter for therapeutic drug level monitoring: Secondary | ICD-10-CM

## 2015-12-05 LAB — POCT INR: INR: 2.4

## 2015-12-08 ENCOUNTER — Ambulatory Visit (INDEPENDENT_AMBULATORY_CARE_PROVIDER_SITE_OTHER): Payer: Medicare Other | Admitting: Interventional Cardiology

## 2015-12-08 ENCOUNTER — Encounter: Payer: Self-pay | Admitting: Interventional Cardiology

## 2015-12-08 VITALS — BP 116/80 | HR 80 | Ht 73.5 in | Wt 195.8 lb

## 2015-12-08 DIAGNOSIS — Z95810 Presence of automatic (implantable) cardiac defibrillator: Secondary | ICD-10-CM

## 2015-12-08 DIAGNOSIS — I38 Endocarditis, valve unspecified: Secondary | ICD-10-CM | POA: Diagnosis not present

## 2015-12-08 DIAGNOSIS — I480 Paroxysmal atrial fibrillation: Secondary | ICD-10-CM

## 2015-12-08 DIAGNOSIS — I472 Ventricular tachycardia, unspecified: Secondary | ICD-10-CM

## 2015-12-08 DIAGNOSIS — I5022 Chronic systolic (congestive) heart failure: Secondary | ICD-10-CM | POA: Diagnosis not present

## 2015-12-08 DIAGNOSIS — I4892 Unspecified atrial flutter: Secondary | ICD-10-CM | POA: Diagnosis not present

## 2015-12-08 NOTE — Patient Instructions (Signed)

## 2015-12-08 NOTE — Progress Notes (Signed)
Cardiology Office Note    Date:  12/08/2015   ID:  Austin [name], DOB 04-15-1941, MRN [id number]  PCP:  Mathews Argyle, MD  Cardiologist: Sinclair Grooms, MD   Chief Complaint  Patient presents with  . Mitral Regurgitation  . Atrial Fibrillation    History of Present Illness:  Austin [name] is a 75 y.o. male mechanical mitral valve, chronic anticoagulation therapy, nonischemic cardiomyopathy, biventricular implantable cardiac defibrillator, recurrent ventricular tachycardia, multiple medication regimen to suppress ventricular ectopy, and sleep apnea.  Gene appears depressed. He has had several admissions for ventricular tachycardia storm. He is now on a combination of amiodarone and Mexitil. He is having significant side effects with nausea and greater than 15 pound weight loss over the past 5 weeks. He is waiting to hear from [facility] in [place] to determine if he will be a candidate for ventricular tachycardia ablation. Apparently his focus may be within the scar related to his mitral valve prosthesis. Ablation is been previously attempted by Dr. Lovena Le.  He denies dyspnea. No neurological complaints or bleeding. He denies syncope. No recurrence of prolonged tachycardia since optimization of Mexitil dose.  Past Medical History  Diagnosis Date  . Nonischemic cardiomyopathy ([place])   . 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 Psychosexual dysfunction with inhibited sexual excitement   . Conductive hearing loss   . Syncope and collapse   . Atrial fibrillation ([place])   . Intraspinal abscess   . Mitral valve insufficiency and aortic valve insufficiency     s/p MVR mechanical  . S/P mitral valve replacement   . Unspecified sleep apnea     last sleep study 11/07  . Ventricular tachycardia [facility])     Past Surgical History  Procedure Laterality Date  . Mitral valve  replacement      w #33 [facility]  . Valve replacement  2000  . Evacution of epidural lumbar epidural abscess  1999  . Thyroidectomy    . Tonsillectomy    . Dental implants    . Cardiac catheterization  04/24/2002  . Insert / replace / remove pacemaker  11/2008  . Cardioversion  03/09/2012    Procedure: CARDIOVERSION;  Surgeon: Evans Lance, MD;  Location: [facility];  Service: Cardiovascular;  Laterality: N/A;  . Cardioversion N/A 11/22/2013    Procedure: CARDIOVERSION;  Surgeon: Sanda Klein, MD;  Location: [facility] ENDOSCOPY;  Service: Cardiovascular;  Laterality: N/A;  . Permanent pacemaker insertion N/A 08/05/2011    Procedure: PERMANENT PACEMAKER INSERTION;  Surgeon: Evans Lance, MD;  Location: [facility] CATH LAB;  Service: Cardiovascular;  Laterality: N/A;  . Electrophysiologic study N/A 09/15/2015    Procedure: Stephanie Coup Ablation;  Surgeon: Evans Lance, MD;  Location: [facility] CV LAB;  Service: Cardiovascular;  Laterality: N/A;  . Cardiac valve replacement      Current Medications: Outpatient Prescriptions Prior to Visit  Medication Sig Dispense Refill  . acetaminophen (TYLENOL) 500 MG tablet Take 1,000 mg by mouth every 6 (six) hours as needed (pain).    Marland Kitchen amiodarone (PACERONE) 200 MG tablet Take 1 tablet (200 mg total) by mouth 2 (two) times daily. 60 tablet 3  . folic acid (FOLVITE) 1 MG tablet Take 1 mg by mouth at bedtime.     . furosemide (LASIX) 20 MG tablet Take as needed for weight over 205 30 tablet 3  . levothyroxine (SYNTHROID, LEVOTHROID) 75  MCG tablet Take 75 mcg by mouth daily before breakfast.    . lisinopril (PRINIVIL,ZESTRIL) 10 MG tablet Take 10 mg by mouth at bedtime.   9  . metoprolol tartrate (LOPRESSOR) 25 MG tablet Take 0.5 tablets (12.5 mg total) by mouth 2 (two) times daily. 180 tablet 3  . Multiple Vitamin (MULTIVITAMIN WITH MINERALS) TABS tablet Take 1 tablet by mouth daily.    . Omega-3 Fatty Acids (FISH OIL) 1000 MG CAPS Take 1,000 mg by mouth at bedtime.      . ondansetron (ZOFRAN) 4 MG tablet Take 0.5 tablets (2 mg total) by mouth every 8 (eight) hours as needed for nausea or vomiting (take with Mexilitine three times daily). 90 tablet 1  . warfarin (COUMADIN) 5 MG tablet Take 0.5-1 tablets (2.5-5 mg total) by mouth at bedtime. Take 1/2 tablet (2.5 mg) by mouth on Tuesday, Thursday,  take 1 tablet (5 mg) on all other days    . mexiletine (MEXITIL) 150 MG capsule Take 2 capsules (300 mg total) by mouth 3 (three) times daily. (Patient not taking: Reported on 12/08/2015) 180 capsule 3   No facility-administered medications prior to visit.     Allergies:   Review of patient's allergies indicates no known allergies.   Social History   Social History  . Marital Status: Married    Spouse Name: N/A  . Number of Children: N/A  . Years of Education: N/A   Social History Main Topics  . Smoking status: Never Smoker   . Smokeless tobacco: Never Used  . Alcohol Use: 0.0 oz/week    0 Standard drinks or equivalent per week     Comment: occasionally  . Drug Use: No  . Sexual Activity: Not Asked   Other Topics Concern  . None   Social History Narrative     Family History:  The patient's family history includes Heart disease in his father and mother; Heart failure in his father and mother.   ROS:   Please see the history of present illness.    Palpitations, fatigue, hearing loss, depression, easy bruising, and decreased appetite associated with nausea.  All other systems reviewed and are negative.   PHYSICAL EXAM:   VS:  BP 116/80 mmHg  Pulse 80  Ht 6' 1.5" (1.867 m)  Wt 195 lb 12.8 oz (88.814 kg)  BMI 25.48 kg/m2   GEN: Well nourished, well developed, in no acute distress HEENT: normal Neck: no JVD, carotid bruits, or masses Cardiac: Irregularly irregularRR;  there are crisp mechanical valve closure sounds related to the mitral valve mechanical prosthesis and also 2/6 systolic murmur at the left lower sternal border. No rubs or gallop  heard. There is no edema  in the lower extremities.  Respiratory:  clear to auscultation bilaterally, normal work of breathing GI: soft, nontender, nondistended, + BS MS: no deformity or atrophy Skin: warm and dry, no rash Neuro:  Alert and Oriented x 3, Strength and sensation are intact Psych: euthymic mood, full affect  Wt Readings from Last 3 Encounters:  12/08/15 195 lb 12.8 oz (88.814 kg)  11/03/15 207 lb 12.8 oz (94.257 kg)  10/29/15 203 lb 6.4 oz (92.262 kg)      Studies/Labs Reviewed:   EKG:  EKG  Is not performed. Previous tracing from 11/03/2015 reveals ventricular pacing.   Current Labs: 10/14/2015: ALT 53*; TSH 3.59 10/26/2015: B Natriuretic Peptide 1387.1*; Hemoglobin 16.0; Platelets 172 10/27/2015: Magnesium 2.0 10/28/2015: BUN 27*; Creatinine, Ser 1.55*; Potassium 3.9; Sodium 139  Lipid Panel    Component Value Date/Time   CHOL 158 08/23/2015 0543   TRIG 130 08/23/2015 0543   HDL 50 08/23/2015 0543   CHOLHDL 3.2 08/23/2015 0543   VLDL 26 08/23/2015 0543   LDLCALC 82 08/23/2015 0543    Additional studies/ records that were reviewed today include:   Reviewed the recent hospital discharge summaries and available laboratory data some of which is noted above.    ASSESSMENT:    1. VT (ventricular tachycardia) ([place])   2. Chronic systolic congestive heart failure ([place])   3. VHD (valvular heart disease)   4. Atrial flutter, unspecified type ([place])   5. Automatic implantable cardioverter-defibrillator in situ   6. Paroxysmal atrial fibrillation ([facility])      PLAN:  In order of problems listed above:  1. Ventricular tachycardia is currently controlled on a combination regimen that is causing significant nausea and weight loss. He is awaiting to hear from [facility] at [place] concerning the possibility of VT ablation 2. No evidence of volume overload and I would have to say that systolic heart failure appears stable  3.  mechanical mitral valve  function appears normal  4.  No recent recurrence of atrial flutter which is been ablated 5. AICD is followed by the EP service 6. The EKG in April appeared to suggest the presence of atrial fibrillation versus AV block with AV dissociation.    Medication Adjustments/Labs and Tests Ordered: Current medicines are reviewed at length with the patient today.  Concerns regarding medicines are outlined above.  Medication changes, Labs and Tests ordered today are listed in the Patient Instructions below. There are no Patient Instructions on file for this visit.   Signed, Sinclair Grooms, MD  12/08/2015 4:11 PM    [facility] [address], [place], [place]  [address] Phone: [phone]; Fax: [phone]

## 2015-12-09 ENCOUNTER — Telehealth: Payer: Self-pay | Admitting: Internal Medicine

## 2015-12-09 NOTE — Telephone Encounter (Signed)
Pt  was referred to A Dr. In [facility] for F/U care. Pt states that the Dr's office called to let pt know that they don't accept the [place] health care insurance. Pt would like for Dr. Lovena Le to referrer him to  somewhere in [place], [place], or call the [employer], because he cant afford to pay on his own.

## 2015-12-09 NOTE — Telephone Encounter (Signed)
New Message  Pt request a call back states that it is important no further details. He request to please call when you can.

## 2015-12-10 ENCOUNTER — Encounter: Payer: Self-pay | Admitting: Interventional Cardiology

## 2015-12-10 NOTE — Telephone Encounter (Signed)
Dr Lovena Le referred to Dr Noralee Stain in [place].  He has an appointment on 12/25/15

## 2015-12-10 NOTE — Telephone Encounter (Signed)
Follow-up  Pt is calling back to give some new information to S[place].

## 2015-12-12 ENCOUNTER — Ambulatory Visit (INDEPENDENT_AMBULATORY_CARE_PROVIDER_SITE_OTHER): Payer: Medicare Other | Admitting: Interventional Cardiology

## 2015-12-12 DIAGNOSIS — Z5181 Encounter for therapeutic drug level monitoring: Secondary | ICD-10-CM

## 2015-12-12 LAB — POCT INR: INR: 6.3

## 2015-12-15 ENCOUNTER — Ambulatory Visit (INDEPENDENT_AMBULATORY_CARE_PROVIDER_SITE_OTHER): Payer: Medicare Other | Admitting: Pharmacist

## 2015-12-15 DIAGNOSIS — Z5181 Encounter for therapeutic drug level monitoring: Secondary | ICD-10-CM

## 2015-12-15 LAB — POCT INR: INR: 3.1

## 2015-12-16 ENCOUNTER — Ambulatory Visit: Payer: Medicare Other | Admitting: Internal Medicine

## 2015-12-19 LAB — CUP PACEART INCLINIC DEVICE CHECK
Battery Voltage: 2.95 V
Brady Statistic AP VS Percent: 0.76 %
Brady Statistic RA Percent Paced: 19.36 %
HIGH POWER IMPEDANCE MEASURED VALUE: 304 Ohm
HIGH POWER IMPEDANCE MEASURED VALUE: 66 Ohm
Implantable Lead Implant Date: 20130110
Implantable Lead Location: 753859
Implantable Lead Model: 4196
Lead Channel Impedance Value: 418 Ohm
Lead Channel Pacing Threshold Amplitude: 0.75 V
Lead Channel Pacing Threshold Amplitude: 4 V
Lead Channel Pacing Threshold Pulse Width: 0.4 ms
Lead Channel Pacing Threshold Pulse Width: 1.2 ms
Lead Channel Sensing Intrinsic Amplitude: 0.5 mV
Lead Channel Sensing Intrinsic Amplitude: 9.25 mV
Lead Channel Setting Pacing Amplitude: 1.5 V
Lead Channel Setting Pacing Amplitude: 2.5 V
Lead Channel Setting Sensing Sensitivity: 0.3 mV
MDC IDC LEAD IMPLANT DT: 20130110
MDC IDC LEAD IMPLANT DT: 20130110
MDC IDC LEAD LOCATION: 753858
MDC IDC LEAD LOCATION: 753860
MDC IDC LEAD MODEL: 6935
MDC IDC MSMT LEADCHNL LV IMPEDANCE VALUE: 456 Ohm
MDC IDC MSMT LEADCHNL LV IMPEDANCE VALUE: 456 Ohm
MDC IDC MSMT LEADCHNL LV IMPEDANCE VALUE: 779 Ohm
MDC IDC MSMT LEADCHNL RA IMPEDANCE VALUE: 418 Ohm
MDC IDC SESS DTM: 20170410145325
MDC IDC SET LEADCHNL LV PACING AMPLITUDE: 5 V
MDC IDC SET LEADCHNL LV PACING PULSEWIDTH: 1.2 ms
MDC IDC SET LEADCHNL RV PACING PULSEWIDTH: 0.4 ms
MDC IDC STAT BRADY AP VP PERCENT: 18.61 %
MDC IDC STAT BRADY AS VP PERCENT: 75.45 %
MDC IDC STAT BRADY AS VS PERCENT: 5.18 %
MDC IDC STAT BRADY RV PERCENT PACED: 94.06 %

## 2015-12-23 ENCOUNTER — Ambulatory Visit (INDEPENDENT_AMBULATORY_CARE_PROVIDER_SITE_OTHER): Payer: Medicare Other

## 2015-12-23 ENCOUNTER — Telehealth: Payer: Self-pay | Admitting: Internal Medicine

## 2015-12-23 DIAGNOSIS — Z5181 Encounter for therapeutic drug level monitoring: Secondary | ICD-10-CM

## 2015-12-23 LAB — POCT INR: INR: 2.7

## 2015-12-23 NOTE — Telephone Encounter (Signed)
New message   The office wanted to make sure of the PT/INR level cause of the procedure the pt is having.   Request for surgical clearance:  1. What type of surgery is being performed? Albation  2. When is this surgery scheduled? June 16th  3. Are there any medications that need to be held prior to surgery and how long?warfrin  4. Name of physician performing surgery? The nurse did not provide the information    What is your office phone and fax number? Fax number [phone] number [phone]

## 2015-12-23 NOTE — Telephone Encounter (Signed)
Returned call to Jarrett Soho, pt is having a v-tach ablation by [facility] arrhthymia on June 16th, they want INR to be between 2.0-2.5 at that time.  Advised pt's INR range is 3.0-3.5 but we will dose adjust prior to procedure to try to get INR around 2.5.

## 2015-12-24 ENCOUNTER — Other Ambulatory Visit: Payer: Self-pay | Admitting: Interventional Cardiology

## 2015-12-30 ENCOUNTER — Ambulatory Visit (INDEPENDENT_AMBULATORY_CARE_PROVIDER_SITE_OTHER): Payer: Medicare Other

## 2015-12-30 DIAGNOSIS — Z5181 Encounter for therapeutic drug level monitoring: Secondary | ICD-10-CM

## 2015-12-30 LAB — POCT INR: INR: 1.7

## 2016-01-05 ENCOUNTER — Ambulatory Visit (INDEPENDENT_AMBULATORY_CARE_PROVIDER_SITE_OTHER): Payer: Medicare Other

## 2016-01-05 DIAGNOSIS — Z5181 Encounter for therapeutic drug level monitoring: Secondary | ICD-10-CM

## 2016-01-05 LAB — POCT INR: INR: 3.1

## 2016-01-10 DIAGNOSIS — I9581 Postprocedural hypotension: Secondary | ICD-10-CM | POA: Insufficient documentation

## 2016-01-10 DIAGNOSIS — Z9889 Other specified postprocedural states: Secondary | ICD-10-CM

## 2016-01-10 DIAGNOSIS — Z8679 Personal history of other diseases of the circulatory system: Secondary | ICD-10-CM | POA: Insufficient documentation

## 2016-01-10 DIAGNOSIS — R791 Abnormal coagulation profile: Secondary | ICD-10-CM | POA: Insufficient documentation

## 2016-01-13 ENCOUNTER — Ambulatory Visit (INDEPENDENT_AMBULATORY_CARE_PROVIDER_SITE_OTHER): Payer: Medicare Other | Admitting: Internal Medicine

## 2016-01-13 ENCOUNTER — Telehealth: Payer: Self-pay | Admitting: *Deleted

## 2016-01-13 DIAGNOSIS — Z5181 Encounter for therapeutic drug level monitoring: Secondary | ICD-10-CM

## 2016-01-13 LAB — POCT INR: INR: 1.6

## 2016-01-13 MED ORDER — ENOXAPARIN SODIUM 80 MG/0.8ML ~~LOC~~ SOLN: 80.0000 mg | Freq: Two times a day (BID) | SUBCUTANEOUS | Status: DC

## 2016-01-13 NOTE — Telephone Encounter (Signed)
Patient called wanting to know when he should send a remote transmission from home as he has listed 01/15/16 and 02/02/16 on his paperwork.  Advised that as he is scheduled to see Dr. Lovena Le on 02/05/16, he can disregard both appointments.  Patient verbalizes understanding and denies additional questions or concerns at this time.

## 2016-01-16 ENCOUNTER — Ambulatory Visit (INDEPENDENT_AMBULATORY_CARE_PROVIDER_SITE_OTHER): Payer: Medicare Other | Admitting: Cardiovascular Disease

## 2016-01-16 DIAGNOSIS — Z5181 Encounter for therapeutic drug level monitoring: Secondary | ICD-10-CM

## 2016-01-16 LAB — POCT INR: INR: 3.1

## 2016-01-22 ENCOUNTER — Ambulatory Visit (INDEPENDENT_AMBULATORY_CARE_PROVIDER_SITE_OTHER): Payer: Medicare Other | Admitting: Interventional Cardiology

## 2016-01-22 DIAGNOSIS — Z5181 Encounter for therapeutic drug level monitoring: Secondary | ICD-10-CM

## 2016-01-22 LAB — POCT INR: INR: 2.5

## 2016-01-29 ENCOUNTER — Ambulatory Visit (INDEPENDENT_AMBULATORY_CARE_PROVIDER_SITE_OTHER): Payer: Medicare Other | Admitting: Cardiology

## 2016-01-29 DIAGNOSIS — Z5181 Encounter for therapeutic drug level monitoring: Secondary | ICD-10-CM

## 2016-01-29 LAB — POCT INR: INR: 3.5

## 2016-02-05 ENCOUNTER — Encounter: Payer: Self-pay | Admitting: Internal Medicine

## 2016-02-05 ENCOUNTER — Ambulatory Visit (INDEPENDENT_AMBULATORY_CARE_PROVIDER_SITE_OTHER): Payer: Medicare Other | Admitting: Internal Medicine

## 2016-02-05 VITALS — BP 110/80 | HR 83 | Ht 73.5 in | Wt 192.0 lb

## 2016-02-05 DIAGNOSIS — Z95810 Presence of automatic (implantable) cardiac defibrillator: Secondary | ICD-10-CM

## 2016-02-05 DIAGNOSIS — I472 Ventricular tachycardia, unspecified: Secondary | ICD-10-CM

## 2016-02-05 LAB — CUP PACEART INCLINIC DEVICE CHECK
Battery Voltage: 2.83 V
Brady Statistic RA Percent Paced: 3.55 %
HIGH POWER IMPEDANCE MEASURED VALUE: 304 Ohm
HIGH POWER IMPEDANCE MEASURED VALUE: 60 Ohm
Implantable Lead Implant Date: 20130110
Implantable Lead Location: 753859
Implantable Lead Model: 4196
Implantable Lead Model: 6935
Lead Channel Impedance Value: 418 Ohm
Lead Channel Impedance Value: 418 Ohm
Lead Channel Impedance Value: 418 Ohm
Lead Channel Sensing Intrinsic Amplitude: 4.5 mV
Lead Channel Setting Pacing Amplitude: 2.5 V
Lead Channel Setting Sensing Sensitivity: 0.3 mV
MDC IDC LEAD IMPLANT DT: 20130110
MDC IDC LEAD IMPLANT DT: 20130110
MDC IDC LEAD LOCATION: 753858
MDC IDC LEAD LOCATION: 753860
MDC IDC MSMT LEADCHNL LV IMPEDANCE VALUE: 760 Ohm
MDC IDC MSMT LEADCHNL RA SENSING INTR AMPL: 0.9 mV
MDC IDC MSMT LEADCHNL RV IMPEDANCE VALUE: 361 Ohm
MDC IDC MSMT LEADCHNL RV PACING THRESHOLD AMPLITUDE: 1 V
MDC IDC MSMT LEADCHNL RV PACING THRESHOLD PULSEWIDTH: 0.4 ms
MDC IDC SESS DTM: 20170713134534
MDC IDC SET LEADCHNL RA PACING AMPLITUDE: 2 V
MDC IDC SET LEADCHNL RV PACING PULSEWIDTH: 0.4 ms
MDC IDC STAT BRADY AP VP PERCENT: 3.51 %
MDC IDC STAT BRADY AP VS PERCENT: 0.05 %
MDC IDC STAT BRADY AS VP PERCENT: 89.04 %
MDC IDC STAT BRADY AS VS PERCENT: 7.4 %
MDC IDC STAT BRADY RV PERCENT PACED: 92.55 %

## 2016-02-05 MED ORDER — AMIODARONE HCL 200 MG PO TABS
300.0000 mg | ORAL_TABLET | Freq: Every day | ORAL | Status: DC
Start: 2016-02-05 — End: 2016-03-12

## 2016-02-05 NOTE — Patient Instructions (Addendum)
Medication Instructions:  Your physician has recommended you make the following change in your medication:  1) Decrease Amiodarone to 300mg  daily ---1 1/2 tablets daily   Labwork: None ordered   Testing/Procedures:  You have been referred to cardiac rehab    Follow-Up:  Your physician recommends that you schedule a follow-up appointment in:  8 weeks with Dr Lovena Le         Any Other Special Instructions Will Be Listed Below (If Applicable).     If you need a refill on your cardiac medications before your next appointment, please call your pharmacy.

## 2016-02-05 NOTE — Progress Notes (Signed)
HPI [name] returns today for followup. He is a pleasant 75 yo man with a non-ischemic CM, chronic systolic heart failure, atrial fib, and VT. He is s/p ICD implant and s/p VT ablation several months ago.  He has had no syncope. He was hospitalized several weeks ago and then last week with VT storm. His VT was in the 145 range and then in the 115 range. He was given an additional dose of amio and mexilitine and was stable from an arrhythmia perspective but got anorexia. We ultimately stopped the Mexilitine after he underwent a combined endo/epicardial ablation over in [place]. He has been off of the Mexitil for several weeks. No recurrent VT.   No Known Allergies   Current Outpatient Prescriptions  Medication Sig Dispense Refill  . acetaminophen (TYLENOL) 500 MG tablet Take 1,000 mg by mouth every 6 (six) hours as needed (pain).    Marland Kitchen amiodarone (PACERONE) 200 MG tablet Take 1.5 tablets (300 mg total) by mouth daily.    . folic acid (FOLVITE) 1 MG tablet Take 1 mg by mouth at bedtime.     . furosemide (LASIX) 20 MG tablet Take 20 mg by mouth daily as needed. For weight over 205    . levothyroxine (SYNTHROID, LEVOTHROID) 75 MCG tablet Take 75 mcg by mouth daily before breakfast.    . lisinopril (PRINIVIL,ZESTRIL) 10 MG tablet Take 10 mg by mouth at bedtime.   9  . metoprolol tartrate (LOPRESSOR) 25 MG tablet Take 0.5 tablets (12.5 mg total) by mouth 2 (two) times daily. 180 tablet 3  . Multiple Vitamin (MULTIVITAMIN WITH MINERALS) TABS tablet Take 1 tablet by mouth daily.    . Omega-3 Fatty Acids (FISH OIL) 1000 MG CAPS Take 1,000 mg by mouth at bedtime.     Marland Kitchen warfarin (COUMADIN) 5 MG tablet TAKE AS DIRECTED BY COUMADIN CLINIC 90 tablet 1   No current facility-administered medications for this visit.     Past Medical History  Diagnosis Date  . Nonischemic cardiomyopathy ([place])   . 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 Psychosexual dysfunction with inhibited sexual excitement   . Conductive hearing loss   . Syncope and collapse   . Atrial fibrillation ([place])   . Intraspinal abscess   . Mitral valve insufficiency and aortic valve insufficiency     s/p MVR mechanical  . S/P mitral valve replacement   . Unspecified sleep apnea     last sleep study 11/07  . Ventricular tachycardia ([place])     ROS:   All systems reviewed and negative except as noted in the HPI.   Past Surgical History  Procedure Laterality Date  . Mitral valve replacement      w #33 [facility]  . Valve replacement  2000  . Evacution of epidural lumbar epidural abscess  1999  . Thyroidectomy    . Tonsillectomy    . Dental implants    . Cardiac catheterization  04/24/2002  . Insert / replace / remove pacemaker  11/2008  . Cardioversion  03/09/2012    Procedure: CARDIOVERSION;  Surgeon: Evans Lance, MD;  Location: [facility];  Service: Cardiovascular;  Laterality: N/A;  . Cardioversion N/A 11/22/2013    Procedure: CARDIOVERSION;  Surgeon: Sanda Klein, MD;  Location: [facility] ENDOSCOPY;  Service: Cardiovascular;  Laterality: N/A;  . Permanent pacemaker insertion N/A 08/05/2011    Procedure: PERMANENT PACEMAKER INSERTION;  Surgeon: Evans Lance,  MD;  Location: [facility] CATH LAB;  Service: Cardiovascular;  Laterality: N/A;  . Electrophysiologic study N/A 09/15/2015    Procedure: Stephanie Coup Ablation;  Surgeon: Evans Lance, MD;  Location: [facility] CV LAB;  Service: Cardiovascular;  Laterality: N/A;  . Cardiac valve replacement       Family History  Problem Relation Age of Onset  . Heart disease Mother   . Heart failure Mother   . Heart disease Father   . Heart failure Father      Social History   Social History  . Marital Status: Married    Spouse Name: N/A  . Number of Children: N/A  . Years of Education: N/A   Occupational History  . Not on file.   Social History Main Topics  . Smoking status: Never Smoker    . Smokeless tobacco: Never Used  . Alcohol Use: 0.0 oz/week    0 Standard drinks or equivalent per week     Comment: occasionally  . Drug Use: No  . Sexual Activity: Not on file   Other Topics Concern  . Not on file   Social History Narrative     BP 110/80 mmHg  Pulse 83  Ht 6' 1.5" (1.867 m)  Wt 192 lb (87.091 kg)  BMI 24.99 kg/m2  Physical Exam:  Well appearing 75 year old man, NAD HEENT: Unremarkable Neck:  7 cm JVD, no thyromegally Back:  No CVA tenderness Lungs:  Clear with no wheezes, rales, or rhonchi.  Well-healed ICD incision HEART:  Regular rate rhythm, soft systolic murmur, no rubs, no clicks, mechanical S1. Abd:  soft, positive bowel sounds, no organomegally, no rebound, no guarding Ext:  2 plus pulses, trace peripheral edema, no cyanosis, no clubbing Skin:  No rashes no nodules Neuro:  CN II through XII intact, motor grossly intact  ECG - atrial fib with ventricular pacing  DEVICE  Normal device function.  See PaceArt for details.   Assess/Plan: 1. VT - he has not had recurrent VT off of Mexitil. I will reduce his dose of amio to 300 mg daily. 2. Chronic systolic heart failure - his symptoms are stable but I am concerned about battery longevity. Today, I have stopped LV pacing and reprogrammed his device to allow him to conduct thru in atrial fib. He is currently conducting in the 50-70 /min range. 3. BiV ICD - his device is stable. Will hopefully be able to improve battery longevity by stopping high output LV pacing. 4. Atrial fib - he will continue his current meds. His rate is controlled. 5. Anti-coag - with his mechanical mitral valve, will continue coumadin.   Austin [name],M.D.  Austin [name].D.

## 2016-02-06 ENCOUNTER — Ambulatory Visit (INDEPENDENT_AMBULATORY_CARE_PROVIDER_SITE_OTHER): Payer: Medicare Other | Admitting: Cardiology

## 2016-02-06 DIAGNOSIS — Z5181 Encounter for therapeutic drug level monitoring: Secondary | ICD-10-CM

## 2016-02-06 LAB — POCT INR: INR: 3.1

## 2016-02-13 ENCOUNTER — Ambulatory Visit (INDEPENDENT_AMBULATORY_CARE_PROVIDER_SITE_OTHER): Payer: Medicare Other | Admitting: Cardiology

## 2016-02-13 DIAGNOSIS — Z5181 Encounter for therapeutic drug level monitoring: Secondary | ICD-10-CM

## 2016-02-13 LAB — POCT INR: INR: 2.6

## 2016-02-19 ENCOUNTER — Encounter (HOSPITAL_COMMUNITY): Payer: Self-pay

## 2016-02-23 ENCOUNTER — Ambulatory Visit (INDEPENDENT_AMBULATORY_CARE_PROVIDER_SITE_OTHER): Payer: Medicare Other | Admitting: Pharmacist

## 2016-02-23 DIAGNOSIS — Z5181 Encounter for therapeutic drug level monitoring: Secondary | ICD-10-CM

## 2016-02-23 LAB — POCT INR: INR: 3

## 2016-02-27 ENCOUNTER — Encounter: Payer: Self-pay | Admitting: Physician Assistant

## 2016-02-27 ENCOUNTER — Telehealth: Payer: Self-pay | Admitting: Internal Medicine

## 2016-02-27 NOTE — Telephone Encounter (Signed)
Called patient to ask if he could send in a remote transmission per Tommye Standard, [place]. Patient states that he will send it shortly.

## 2016-02-27 NOTE — Telephone Encounter (Signed)
Austin [name] is calling because he is having some heart rate issues.  Slow heart rate and low blood pressure.Had some yesterday and today . Please call   Thanks

## 2016-02-27 NOTE — Telephone Encounter (Signed)
Called stating yesterday he went to the gym and after he got home became lightheaded and weak.  He took his BP which was 90/58 HR 42. States his pulse did feel erratic  He states he is trying to stay hydrated. Today BP is 120/64 HR 53 and regular.  States he feels better just a little light headed and weak. Dr. Lovena Le did reduce his Amiodarone to 300 mg daily (1 1/2 tablets). States he has felt good over the past few months. Spoke w/ Newt Lukes who suggested he send in a transmission given his hx of Afib and VT storm.  Per Newt Lukes transmission did not show any Afib or VT.  Rhythm was good.  Did show some Atrial flutter which he has had. Advised to continue same dose of Amiodarone.  If he has another episode of low BP, HR and feeling bad he will need to go to ER.

## 2016-03-05 ENCOUNTER — Ambulatory Visit (INDEPENDENT_AMBULATORY_CARE_PROVIDER_SITE_OTHER): Payer: Medicare Other | Admitting: Cardiology

## 2016-03-05 DIAGNOSIS — Z5181 Encounter for therapeutic drug level monitoring: Secondary | ICD-10-CM

## 2016-03-05 LAB — POCT INR: INR: 2.5

## 2016-03-09 ENCOUNTER — Encounter (HOSPITAL_COMMUNITY): Payer: Self-pay

## 2016-03-09 ENCOUNTER — Encounter (HOSPITAL_COMMUNITY)
Admission: RE | Admit: 2016-03-09 | Discharge: 2016-03-09 | Disposition: A | Discharge status: Home / Self Care | Payer: Medicare Other | Source: Ambulatory Visit | Attending: Internal Medicine | Admitting: Internal Medicine

## 2016-03-09 VITALS — BP 128/70 | HR 57 | Ht 73.0 in | Wt 189.6 lb

## 2016-03-09 DIAGNOSIS — Z9889 Other specified postprocedural states: Secondary | ICD-10-CM | POA: Diagnosis not present

## 2016-03-09 DIAGNOSIS — I5022 Chronic systolic (congestive) heart failure: Secondary | ICD-10-CM | POA: Diagnosis present

## 2016-03-09 NOTE — Progress Notes (Signed)
Cardiac Rehab Medication Review by a Pharmacist  Does the patient  feel that his/her medications are working for him/her?  yes  Has the patient been experiencing any side effects to the medications prescribed?  yes  Does the patient measure his/her own blood pressure or blood glucose at home?  yes   Does the patient have any problems obtaining medications due to transportation or finances?   yes  Understanding of regimen: good Understanding of indications: good Potential of compliance: good    Pharmacist comments: Pt presents for cardiac rehab visit. Pt endorses ongoing feeling of lightheadedness unrelated to sitting/standing. Reports that during these episodes his HR is in the 40-50s. Discussed possibility of metoprolol or amiodarone dosesbeing too high for pt, encouraged him to discuss possible dose reduction with his PCP. No other issues noted.   Arrie Senate, PharmD PGY-1 Pharmacy Resident Pager: [phone] 03/09/2016

## 2016-03-10 ENCOUNTER — Telehealth: Payer: Self-pay | Admitting: Internal Medicine

## 2016-03-10 NOTE — Telephone Encounter (Signed)
Spoke with patient and he is having daily lightheadedness.  Says happens mostly when he walks around.  He has to stop and hold on to something until it passes I have added him on to see Dr Lovena Le this Austin [name] to discuss

## 2016-03-10 NOTE — Progress Notes (Signed)
Cardiac Individual Treatment Plan  Patient Details  Name: Austin [name] MRN: [id number] Date of Birth: Dec 29, 1940 Referring Provider:   Flowsheet Row CARDIAC REHAB PHASE II ORIENTATION from 03/09/2016 in [facility]  Referring Provider  [name] MD      Initial Encounter Date:  [facility] PHASE II ORIENTATION from 03/09/2016 in [facility]  Date  03/09/16  Referring Provider  [name] MD      Visit Diagnosis: Heart failure, chronic systolic ([place])  Patient's Home Medications on Admission:  Current Outpatient Prescriptions:  .  acetaminophen (TYLENOL) 500 MG tablet, Take 1,000 mg by mouth every 6 (six) hours as needed (pain)., Disp: , Rfl:  .  amiodarone (PACERONE) 200 MG tablet, Take 1.5 tablets (300 mg total) by mouth daily., Disp: , Rfl:  .  folic acid (FOLVITE) 1 MG tablet, Take 1 mg by mouth at bedtime. , Disp: , Rfl:  .  furosemide (LASIX) 20 MG tablet, Take 20 mg by mouth daily as needed. For weight over 205, Disp: , Rfl:  .  levothyroxine (SYNTHROID, LEVOTHROID) 75 MCG tablet, Take 75 mcg by mouth daily before breakfast., Disp: , Rfl:  .  lisinopril (PRINIVIL,ZESTRIL) 10 MG tablet, Take 10 mg by mouth at bedtime. , Disp: , Rfl: 9 .  metoprolol tartrate (LOPRESSOR) 25 MG tablet, Take 0.5 tablets (12.5 mg total) by mouth 2 (two) times daily., Disp: 180 tablet, Rfl: 3 .  Multiple Vitamin (MULTIVITAMIN WITH MINERALS) TABS tablet, Take 1 tablet by mouth daily., Disp: , Rfl:  .  Omega-3 Fatty Acids (FISH OIL) 1000 MG CAPS, Take 1,000 mg by mouth at bedtime. , Disp: , Rfl:  .  warfarin (COUMADIN) 5 MG tablet, TAKE AS DIRECTED BY COUMADIN CLINIC (Patient taking differently: 5 mg MWF, 2.5 mg AOD), Disp: 90 tablet, Rfl: 1  Past Medical History: Past Medical History:  Diagnosis Date  . Atrial fibrillation ([place])   . 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 Conductive hearing loss   . Intraspinal abscess   . 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 Nonischemic cardiomyopathy ([place])   . Psychosexual dysfunction with inhibited sexual excitement   . S/P mitral valve replacement   . Syncope and collapse   . Unspecified sleep apnea    last sleep study 11/07  . Ventricular tachycardia ([facility])     Tobacco Use: History  Smoking Status  . Never Smoker  Smokeless Tobacco  . Never Used    Labs: Recent Review Flowsheet Data    Labs for ITP Cardiac and Pulmonary Rehab Latest Ref Rng & Units 08/22/2015 08/23/2015   Cholestrol 0 - 200 mg/dL - 158   LDLCALC 0 - 99 mg/dL - 82   HDL >40 mg/dL - 50   Trlycerides <150 mg/dL - 130   TCO2 0 - 100 mmol/L 23 -      Capillary Blood Glucose: Lab Results  Component Value Date   GLUCAP 68 08/23/2015   GLUCAP 129 (H) 06/27/2011   GLUCAP 97 06/27/2011   GLUCAP 111 (H) 06/27/2011   GLUCAP 99 06/27/2011     Exercise Target Goals: Date: 03/09/16  Exercise Program Goal: Individual exercise prescription set with THRR, safety & activity barriers. Participant demonstrates ability to understand and report RPE using BORG scale, to self-measure pulse accurately, and to acknowledge the importance  of the exercise prescription.  Exercise Prescription Goal: Starting with aerobic activity 30 plus minutes a day, 3 days per week for initial exercise prescription. Provide home exercise prescription and guidelines that participant acknowledges understanding prior to discharge.  Activity Barriers & Risk Stratification:     Activity Barriers & Cardiac Risk Stratification - 03/09/16 1346      Activity Barriers & Cardiac Risk Stratification   Activity Barriers None   Cardiac Risk Stratification High      6 Minute Walk:     6 Minute Walk    Row Name 03/09/16 1635         6 Minute Walk   Phase Initial     Distance 1412 feet      Walk Time 6 minutes     # of Rest Breaks 0     MPH 2.67     METS 3.09     RPE 11     VO2 Peak 10.83     Symptoms No     Resting HR 57 bpm     Resting BP 128/70     Max Ex. HR 85 bpm     Max Ex. BP 138/60     2 Minute Post BP 108/70        Initial Exercise Prescription:     Initial Exercise Prescription - 03/09/16 1600      Date of Initial Exercise RX and Referring Provider   Date 03/09/16   Referring Provider [name] MD     Treadmill   MPH 2.3   Grade 1   Minutes 10   METs 3.08     Bike   Level 1   Minutes 10   METs 3.22     NuStep   Level 3   Minutes 10   METs 2.8     Prescription Details   Frequency (times per week) 3   Duration Progress to 30 minutes of continuous aerobic without signs/symptoms of physical distress     Intensity   THRR 40-80% of Max Heartrate 59-118   Ratings of Perceived Exertion 11-13   Perceived Dyspnea 0-4     Progression   Progression Continue to progress workloads to maintain intensity without signs/symptoms of physical distress.     Resistance Training   Training Prescription Yes   Weight 2lbs   Reps 10-12      Perform Capillary Blood Glucose checks as needed.  Exercise Prescription Changes:   Exercise Comments:   Discharge Exercise Prescription (Final Exercise Prescription Changes):   Nutrition:  Target Goals: Understanding of nutrition guidelines, daily intake of sodium 1500mg , cholesterol 200mg , calories 30% from fat and 7% or less from saturated fats, daily to have 5 or more servings of fruits and vegetables.  Biometrics:     Pre Biometrics - 03/09/16 1634      Pre Biometrics   Height 6\' 1"  (1.854 m)   Weight 189 lb 9.5 oz (86 kg)   Waist Circumference 36 inches   Hip Circumference 40.75 inches   Waist to Hip Ratio 0.88 %   BMI (Calculated) 25.1   Triceps Skinfold 12 mm   % Body Fat 23.5 %   Grip Strength 39.5 kg   Flexibility 8 in   Single Leg Stand 1.03 seconds       Nutrition  Therapy Plan and Nutrition Goals:   Nutrition Discharge: Nutrition Scores:     Nutrition Assessments - 03/09/16 1517      MEDFICTS Scores   Pre  Score 64      Nutrition Goals Re-Evaluation:   Psychosocial: Target Goals: Acknowledge presence or absence of depression, maximize coping skills, provide positive support system. Participant is able to verbalize types and ability to use techniques and skills needed for reducing stress and depression.  Initial Review & Psychosocial Screening:   Quality of Life Scores:     Quality of Life - 03/09/16 1638      Quality of Life Scores   Health/Function Pre 23.63 %   Socioeconomic Pre 29.64 %   Psych/Spiritual Pre 27.21 %   Family Pre 30 %   GLOBAL Pre 26.54 %      PHQ-9: Recent Review Flowsheet Data    There is no flowsheet data to display.      Psychosocial Evaluation and Intervention:   Psychosocial Re-Evaluation:   Vocational Rehabilitation: Provide vocational rehab assistance to qualifying candidates.   Vocational Rehab Evaluation & Intervention:     Vocational Rehab - 03/09/16 1535      Initial Vocational Rehab Evaluation & Intervention   Assessment shows need for Vocational Rehabilitation No      Education: Education Goals: Education classes will be provided on a weekly basis, covering required topics. Participant will state understanding/return demonstration of topics presented.  Learning Barriers/Preferences:     Learning Barriers/Preferences - 03/09/16 1347      Learning Barriers/Preferences   Learning Barriers None   Learning Preferences Audio      Education Topics: Count Your Pulse:  -Group instruction provided by verbal instruction, demonstration, patient participation and written materials to support subject.  Instructors address importance of being able to find your pulse and how to count your pulse when at home without a heart monitor.  Patients get hands on experience counting their pulse  with staff help and individually.   Heart Attack, Angina, and Risk Factor Modification:  -Group instruction provided by verbal instruction, video, and written materials to support subject.  Instructors address signs and symptoms of angina and heart attacks.    Also discuss risk factors for heart disease and how to make changes to improve heart health risk factors.   Functional Fitness:  -Group instruction provided by verbal instruction, demonstration, patient participation, and written materials to support subject.  Instructors address safety measures for doing things around the house.  Discuss how to get up and down off the floor, how to pick things up properly, how to safely get out of a chair without assistance, and balance training.   Meditation and Mindfulness:  -Group instruction provided by verbal instruction, patient participation, and written materials to support subject.  Instructor addresses importance of mindfulness and meditation practice to help reduce stress and improve awareness.  Instructor also leads participants through a meditation exercise.    Stretching for Flexibility and Mobility:  -Group instruction provided by verbal instruction, patient participation, and written materials to support subject.  Instructors lead participants through series of stretches that are designed to increase flexibility thus improving mobility.  These stretches are additional exercise for major muscle groups that are typically performed during regular warm up and cool down.   Hands Only CPR Anytime:  -Group instruction provided by verbal instruction, video, patient participation and written materials to support subject.  Instructors co-teach with AHA video for hands only CPR.  Participants get hands on experience with mannequins.   Nutrition I class: Heart Healthy Eating:  -Group instruction provided by PowerPoint slides, verbal discussion, and written materials to support subject matter. The  instructor gives  an explanation and review of the Therapeutic Lifestyle Changes diet recommendations, which includes a discussion on lipid goals, dietary fat, sodium, fiber, plant stanol/sterol esters, sugar, and the components of a well-balanced, healthy diet.   Nutrition II class: Lifestyle Skills:  -Group instruction provided by PowerPoint slides, verbal discussion, and written materials to support subject matter. The instructor gives an explanation and review of label reading, grocery shopping for heart health, heart healthy recipe modifications, and ways to make healthier choices when eating out.   Diabetes Question & Answer:  -Group instruction provided by PowerPoint slides, verbal discussion, and written materials to support subject matter. The instructor gives an explanation and review of diabetes co-morbidities, pre- and post-prandial blood glucose goals, pre-exercise blood glucose goals, signs, symptoms, and treatment of hypoglycemia and hyperglycemia, and foot care basics.   Diabetes Blitz:  -Group instruction provided by PowerPoint slides, verbal discussion, and written materials to support subject matter. The instructor gives an explanation and review of the physiology behind type 1 and type 2 diabetes, diabetes medications and rational behind using different medications, pre- and post-prandial blood glucose recommendations and Hemoglobin A1c goals, diabetes diet, and exercise including blood glucose guidelines for exercising safely.    Portion Distortion:  -Group instruction provided by PowerPoint slides, verbal discussion, written materials, and food models to support subject matter. The instructor gives an explanation of serving size versus portion size, changes in portions sizes over the last 20 years, and what consists of a serving from each food group.   Stress Management:  -Group instruction provided by verbal instruction, video, and written materials to support subject  matter.  Instructors review role of stress in heart disease and how to cope with stress positively.     Exercising on Your Own:  -Group instruction provided by verbal instruction, power point, and written materials to support subject.  Instructors discuss benefits of exercise, components of exercise, frequency and intensity of exercise, and end points for exercise.  Also discuss use of nitroglycerin and activating EMS.  Review options of places to exercise outside of rehab.  Review guidelines for sex with heart disease.   Cardiac Drugs I:  -Group instruction provided by verbal instruction and written materials to support subject.  Instructor reviews cardiac drug classes: antiplatelets, anticoagulants, beta blockers, and statins.  Instructor discusses reasons, side effects, and lifestyle considerations for each drug class.   Cardiac Drugs II:  -Group instruction provided by verbal instruction and written materials to support subject.  Instructor reviews cardiac drug classes: angiotensin converting enzyme inhibitors (ACE-I), angiotensin II receptor blockers (ARBs), nitrates, and calcium channel blockers.  Instructor discusses reasons, side effects, and lifestyle considerations for each drug class.   Anatomy and Physiology of the Circulatory System:  -Group instruction provided by verbal instruction, video, and written materials to support subject.  Reviews functional anatomy of heart, how it relates to various diagnoses, and what role the heart plays in the overall system.   Knowledge Questionnaire Score:     Knowledge Questionnaire Score - 03/09/16 1638      Knowledge Questionnaire Score   Pre Score 23/24      Core Components/Risk Factors/Patient Goals at Admission:     Personal Goals and Risk Factors at Admission - 03/09/16 1535      Core Components/Risk Factors/Patient Goals on Admission    Weight Management Weight Maintenance;Yes   Intervention Weight Management: Develop a  combined nutrition and exercise program designed to reach desired caloric intake, while maintaining appropriate intake of nutrient and  fiber, sodium and fats, and appropriate energy expenditure required for the weight goal.;Weight Management: Provide education and appropriate resources to help participant work on and attain dietary goals.   Admit Weight 189 lb 9.5 oz (86 kg)   Expected Outcomes Weight Maintenance: Understanding of the daily nutrition guidelines, which includes 25-35% calories from fat, 7% or less cal from saturated fats, less than 200mg  cholesterol, less than 1.5gm of sodium, & 5 or more servings of fruits and vegetables daily   Personal Goal Other Yes   Personal Goal Improve leg strength. be able to breathe better. Be able to get back to pre-hospitalization activities.   Intervention Provide individualized aerobic and resistance training program to help achieve goal of increased leg strength, and endurance, so that patient is better able to do ADLs and improve cardiorespiratory fitness, so that pt is abel to breathe better.   Expected Outcomes Be able to resume ADLs that he was able to do previously and to have less shortness of breath.      Core Components/Risk Factors/Patient Goals Review:    Core Components/Risk Factors/Patient Goals at Discharge (Final Review):    ITP Comments:     ITP Comments    Row Name 03/09/16 1338           ITP Comments Medical Director- Dr. Fransico Him, MD          Comments: Patient attended orientation from 1330 to 1530 to review rules and guidelines for program. Completed 6 minute walk test, Intitial ITP, and exercise prescription.  VSS. Telemetry-atrial fib.      Asymptomatic.

## 2016-03-10 NOTE — Telephone Encounter (Signed)
New Message  Pt expressed he would like for nurse to call him due to lightheadedness he's had daily.  Please follow up with pt. Thanks!

## 2016-03-11 ENCOUNTER — Encounter (HOSPITAL_COMMUNITY): Payer: Self-pay

## 2016-03-12 ENCOUNTER — Ambulatory Visit (INDEPENDENT_AMBULATORY_CARE_PROVIDER_SITE_OTHER): Payer: Medicare Other | Admitting: Internal Medicine

## 2016-03-12 ENCOUNTER — Encounter: Payer: Self-pay | Admitting: Internal Medicine

## 2016-03-12 DIAGNOSIS — Z95810 Presence of automatic (implantable) cardiac defibrillator: Secondary | ICD-10-CM | POA: Diagnosis not present

## 2016-03-12 DIAGNOSIS — I5022 Chronic systolic (congestive) heart failure: Secondary | ICD-10-CM

## 2016-03-12 DIAGNOSIS — I472 Ventricular tachycardia, unspecified: Secondary | ICD-10-CM

## 2016-03-12 LAB — CUP PACEART INCLINIC DEVICE CHECK
Battery Voltage: 2.8 V
Brady Statistic AP VP Percent: 2.38 %
Brady Statistic RA Percent Paced: 2.78 %
Brady Statistic RV Percent Paced: 14.34 %
HighPow Impedance: 342 Ohm
HighPow Impedance: 66 Ohm
Implantable Lead Implant Date: 20130110
Implantable Lead Implant Date: 20130110
Implantable Lead Location: 753858
Implantable Lead Location: 753860
Implantable Lead Model: 4196
Implantable Lead Model: 6935
Lead Channel Impedance Value: 418 Ohm
Lead Channel Sensing Intrinsic Amplitude: 4.625 mV
Lead Channel Setting Pacing Pulse Width: 0.4 ms
MDC IDC LEAD IMPLANT DT: 20130110
MDC IDC LEAD LOCATION: 753859
MDC IDC MSMT LEADCHNL LV IMPEDANCE VALUE: 475 Ohm
MDC IDC MSMT LEADCHNL LV IMPEDANCE VALUE: 513 Ohm
MDC IDC MSMT LEADCHNL LV IMPEDANCE VALUE: 836 Ohm
MDC IDC MSMT LEADCHNL LV PACING THRESHOLD AMPLITUDE: 3.5 V
MDC IDC MSMT LEADCHNL LV PACING THRESHOLD PULSEWIDTH: 1.2 ms
MDC IDC MSMT LEADCHNL RA IMPEDANCE VALUE: 418 Ohm
MDC IDC MSMT LEADCHNL RA SENSING INTR AMPL: 0.5 mV
MDC IDC SESS DTM: 20170818120640
MDC IDC SET LEADCHNL RA PACING AMPLITUDE: 2 V
MDC IDC SET LEADCHNL RV PACING AMPLITUDE: 2.5 V
MDC IDC SET LEADCHNL RV SENSING SENSITIVITY: 0.3 mV
MDC IDC STAT BRADY AP VS PERCENT: 0.4 %
MDC IDC STAT BRADY AS VP PERCENT: 11.96 %
MDC IDC STAT BRADY AS VS PERCENT: 85.26 %

## 2016-03-12 MED ORDER — AMIODARONE HCL 200 MG PO TABS: 200.0000 mg | ORAL_TABLET | Freq: Every day | ORAL | Status: DC

## 2016-03-12 MED ORDER — FOLIC ACID 1 MG PO TABS: 1.0000 mg | ORAL_TABLET | Freq: Every day | ORAL | 3 refills | Status: DC

## 2016-03-12 NOTE — Patient Instructions (Signed)
Medication Instructions:   DECREASE AMIODARONE TO 200 MG ONCE DAILY  Follow-Up:  Your physician recommends that you schedule a follow-up appointment in: END OF October

## 2016-03-15 ENCOUNTER — Ambulatory Visit (HOSPITAL_COMMUNITY): Payer: Medicare Other

## 2016-03-15 ENCOUNTER — Encounter (HOSPITAL_COMMUNITY)
Admission: RE | Admit: 2016-03-15 | Discharge: 2016-03-15 | Disposition: A | Discharge status: Home / Self Care | Payer: Medicare Other | Source: Ambulatory Visit | Attending: Internal Medicine | Admitting: Internal Medicine

## 2016-03-15 DIAGNOSIS — I5022 Chronic systolic (congestive) heart failure: Secondary | ICD-10-CM | POA: Diagnosis not present

## 2016-03-15 NOTE — Progress Notes (Signed)
HPI [name] returns today for followup. He is a pleasant 75 yo man with a non-ischemic CM, chronic systolic heart failure, atrial fib, and VT. He underwent repeat catheter ablation several weeks ago. He has continued on amiodarone but his mexilitine was stopped. He has had no recurrent VT. He has had some lightheaded spells but no palpitations. He admits to still driving. He is interested in reducing his amiodarone.   No Known Allergies   Current Outpatient Prescriptions  Medication Sig Dispense Refill  . acetaminophen (TYLENOL) 500 MG tablet Take 1,000 mg by mouth every 6 (six) hours as needed (pain).    Marland Kitchen amiodarone (PACERONE) 200 MG tablet Take 1 tablet (200 mg total) by mouth daily.    . folic acid (FOLVITE) 1 MG tablet Take 1 tablet (1 mg total) by mouth at bedtime. 90 tablet 3  . furosemide (LASIX) 20 MG tablet Take 20 mg by mouth daily as needed. For weight over 205    . levothyroxine (SYNTHROID, LEVOTHROID) 75 MCG tablet Take 75 mcg by mouth daily before breakfast.    . lisinopril (PRINIVIL,ZESTRIL) 10 MG tablet Take 10 mg by mouth at bedtime.   9  . metoprolol tartrate (LOPRESSOR) 25 MG tablet Take 0.5 tablets (12.5 mg total) by mouth 2 (two) times daily. 180 tablet 3  . Multiple Vitamin (MULTIVITAMIN WITH MINERALS) TABS tablet Take 1 tablet by mouth daily.    . Omega-3 Fatty Acids (FISH OIL) 1000 MG CAPS Take 1,000 mg by mouth at bedtime.     Marland Kitchen warfarin (COUMADIN) 5 MG tablet TAKE AS DIRECTED BY COUMADIN CLINIC (Patient taking differently: 5 mg MWF, 2.5 mg AOD) 90 tablet 1   No current facility-administered medications for this visit.      Past Medical History:  Diagnosis Date  . Atrial fibrillation ([place])   . 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 Conductive hearing loss   . Intraspinal abscess   . 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 Nonischemic  cardiomyopathy ([place])   . Psychosexual dysfunction with inhibited sexual excitement   . S/P mitral valve replacement   . Syncope and collapse   . Unspecified sleep apnea    last sleep study 11/07  . Ventricular tachycardia ([place])     ROS:   All systems reviewed and negative except as noted in the HPI.   Past Surgical History:  Procedure Laterality Date  . CARDIAC CATHETERIZATION  04/24/2002  . CARDIAC VALVE REPLACEMENT    . CARDIOVERSION  03/09/2012   Procedure: CARDIOVERSION;  Surgeon: Evans Lance, MD;  Location: [facility];  Service: Cardiovascular;  Laterality: N/A;  . CARDIOVERSION N/A 11/22/2013   Procedure: CARDIOVERSION;  Surgeon: Sanda Klein, MD;  Location: [facility];  Service: Cardiovascular;  Laterality: N/A;  . dental implants    . ELECTROPHYSIOLOGIC STUDY N/A 09/15/2015   Procedure: V Tach Ablation;  Surgeon: Evans Lance, MD;  Location: [facility];  Service: Cardiovascular;  Laterality: N/A;  . Evacution of epidural lumbar epidural abscess  1999  . INSERT / REPLACE / REMOVE PACEMAKER  11/2008  . MITRAL VALVE REPLACEMENT     w #[address] [facility]  . PERMANENT PACEMAKER INSERTION N/A 08/05/2011   Procedure: PERMANENT PACEMAKER INSERTION;  Surgeon: Evans Lance, MD;  Location: [facility] C[facility];  Service: Cardiovascular;  Laterality: N/A;  . THYROIDECTOMY    . TONSILLECTOMY    .  VALVE REPLACEMENT  2000     Family History  Problem Relation Age of Onset  . Heart disease Mother   . Heart failure Mother   . Heart disease Father   . Heart failure Father      Social History   Social History  . Marital status: Married    Spouse name: N/A  . Number of children: N/A  . Years of education: N/A   Occupational History  . Not on file.   Social History Main Topics  . Smoking status: Never Smoker  . Smokeless tobacco: Never Used  . Alcohol use 0.0 oz/week     Comment: occasionally  . Drug use: No  . Sexual activity: Not on file   Other Topics Concern  .  Not on file   Social History Narrative  . No narrative on file     BP 113/70   Pulse (!) 53   Ht 6' 1.5" (1.867 m)   Wt 186 lb 6.4 oz (84.6 kg)   BMI 24.26 kg/m   Physical Exam:  Well appearing 75 year old man, NAD HEENT: Unremarkable Neck:  7 cm JVD, no thyromegally Back:  No CVA tenderness Lungs:  Clear with no wheezes, rales, or rhonchi.  Well-healed ICD incision HEART:  Regular rate rhythm, soft systolic murmur, no rubs, no clicks, mechanical S1. Abd:  soft, positive bowel sounds, no organomegally, no rebound, no guarding Ext:  2 plus pulses, trace peripheral edema, no cyanosis, no clubbing Skin:  No rashes no nodules Neuro:  CN II through XII intact, motor grossly intact  DEVICE  Normal device function.  See PaceArt for details.   Assess/Plan: 1. VT - he has not had recurrent VT. Will reduce his dose of amiodarone.  2. Chronic systolic heart failure - his symptoms are stable. Hopefully we can keep his symptoms class 2. 3. BiV ICD - his device is stable. He is programmed to allow him to conduct on his own. 4. Atrial fib - he will continue his current meds. His rate is controlled. 5. Anti-coag - with his mechanical mitral valve, will continue coumadin.   Mykaylah Ballman,M.D.  Mikle Bosworth.D.

## 2016-03-15 NOTE — Progress Notes (Signed)
Daily Session Note  Patient Details  Name: Austin [name] MRN: [id number] Date of Birth: 12-11-1940 Referring Provider:   Flowsheet Row CARDIAC REHAB PHASE II ORIENTATION from 03/09/2016 in [facility]  Referring Provider  [name] MD      Encounter Date: 03/15/2016  Check In:   Capillary Blood Glucose: No results found for this or any previous visit (from the past 24 hour(s)).   Goals Met:  Exercise tolerated well  Goals Unmet:  Not Applicable  Comments: Pt started cardiac rehab today.  Pt tolerated light exercise without difficulty. VSS, telemetry-chronic atrial fibirillation, asymptomatic.  Medication list reconciled. Pt denies barriers to medicaiton compliance.  PSYCHOSOCIAL ASSESSMENT:  PHQ-0. Pt exhibits positive coping skills, hopeful outlook with supportive family. No psychosocial needs identified at this time, no psychosocial interventions necessary.    Pt enjoys reading and playing golf.   Pt oriented to exercise equipment and routine.    Understanding verbalized.Barnet Pall, RN,BSN 03/15/2016 11:44 AM   Dr. Fransico Him is Medical Director for Cardiac Rehab at [facility].

## 2016-03-17 ENCOUNTER — Encounter (HOSPITAL_COMMUNITY)
Admission: RE | Admit: 2016-03-17 | Discharge: 2016-03-17 | Disposition: A | Discharge status: Home / Self Care | Payer: Medicare Other | Source: Ambulatory Visit | Attending: Internal Medicine | Admitting: Internal Medicine

## 2016-03-17 ENCOUNTER — Ambulatory Visit (HOSPITAL_COMMUNITY): Payer: Medicare Other

## 2016-03-17 DIAGNOSIS — I5022 Chronic systolic (congestive) heart failure: Secondary | ICD-10-CM

## 2016-03-17 LAB — POCT INR: INR: 2.7

## 2016-03-18 ENCOUNTER — Ambulatory Visit (INDEPENDENT_AMBULATORY_CARE_PROVIDER_SITE_OTHER): Payer: Medicare Other

## 2016-03-18 DIAGNOSIS — Z5181 Encounter for therapeutic drug level monitoring: Secondary | ICD-10-CM

## 2016-03-19 ENCOUNTER — Encounter (HOSPITAL_COMMUNITY)
Admission: RE | Admit: 2016-03-19 | Discharge: 2016-03-19 | Disposition: A | Discharge status: Home / Self Care | Payer: Medicare Other | Source: Ambulatory Visit | Attending: Internal Medicine | Admitting: Internal Medicine

## 2016-03-19 ENCOUNTER — Ambulatory Visit (HOSPITAL_COMMUNITY): Payer: Medicare Other

## 2016-03-19 DIAGNOSIS — I5022 Chronic systolic (congestive) heart failure: Secondary | ICD-10-CM | POA: Diagnosis not present

## 2016-03-22 ENCOUNTER — Encounter: Payer: Self-pay | Admitting: Sports Medicine

## 2016-03-22 ENCOUNTER — Ambulatory Visit (INDEPENDENT_AMBULATORY_CARE_PROVIDER_SITE_OTHER): Payer: Medicare Other | Admitting: Sports Medicine

## 2016-03-22 ENCOUNTER — Encounter (HOSPITAL_COMMUNITY)
Admission: RE | Admit: 2016-03-22 | Discharge: 2016-03-22 | Disposition: A | Discharge status: Home / Self Care | Payer: Medicare Other | Source: Ambulatory Visit | Attending: Internal Medicine | Admitting: Internal Medicine

## 2016-03-22 ENCOUNTER — Ambulatory Visit (HOSPITAL_COMMUNITY): Payer: Medicare Other

## 2016-03-22 DIAGNOSIS — Q846 Other congenital malformations of nails: Secondary | ICD-10-CM

## 2016-03-22 DIAGNOSIS — S91209A Unspecified open wound of unspecified toe(s) with damage to nail, initial encounter: Secondary | ICD-10-CM | POA: Diagnosis not present

## 2016-03-22 DIAGNOSIS — M79674 Pain in right toe(s): Secondary | ICD-10-CM

## 2016-03-22 DIAGNOSIS — I5022 Chronic systolic (congestive) heart failure: Secondary | ICD-10-CM

## 2016-03-22 NOTE — Progress Notes (Addendum)
Mr Scollard's resting heart rate was noted in the mid 40's, chronic atrial fibirllation. Blood pressure 108/70. Patient asymptomatic. Tommye Standard PAC called and notified. Renee talked with Dr Lovena Le regarding his brady cardia. No new order received. Mr Bonner Puna proceeded to exercise without complaints. Post exercise hear rate 53. Will continue to monitor the patient throughout  the program.

## 2016-03-22 NOTE — Progress Notes (Signed)
Subjective: Austin [name] is a 75 y.o. male patient presents to office today complaining of a painful bloody right big toenail after hitting it with kitchen door. Patient denies fever/chills/nausea/vomitting/any other related constitutional symptoms at this time.  Patient also is interested in procedures for swollen left 2nd toe; reports it always stays this large and deformed and was told sometime ago that he needed it amputated and would like to discuss future plans.  Patient Active Problem List   Diagnosis Date Noted  . Tachycardia 10/27/2015  . Hypothyroidism 10/27/2015  . H/O mitral valve replacement with mechanical valve   . V tach ([place]) 10/26/2015  . Ventricular tachycardia ([place]) 08/22/2015  . Encounter for therapeutic drug monitoring 08/20/2013  . Mitral valve disorders 05/15/2013  . Heart valve replaced by other means 05/15/2013  . Cardiac device, implant, or graft infection or inflammation ([place]) 06/28/2011  . Cardiomyopathy 06/28/2011  . 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 Chronic systolic heart failure ([place]) 04/15/2009  . HYPERCHOLESTEROLEMIA 11/27/2008  . ERECTILE DYSFUNCTION 11/27/2008  . ABSCESS, SPINE, EPIDURAL 11/27/2008  . HEARING LOSS 11/27/2008  . MITRAL REGURGITATION, SEVERE 11/27/2008  . VENTRICULAR TACHYCARDIA 11/27/2008  . Atrial fibrillation ([place]) 11/27/2008  . LEFT VENTRICULAR FUNCTION, DECREASED 11/27/2008  . SYNCOPE 11/27/2008  . SLEEP APNEA 11/27/2008  . MITRAL VALVE REPLACEMENT, HX OF 11/27/2008    Current Outpatient Prescriptions on File Prior to Visit  Medication Sig Dispense Refill  . acetaminophen (TYLENOL) 500 MG tablet Take 1,000 mg by mouth every 6 (six) hours as needed (pain).    Marland Kitchen amiodarone (PACERONE) 200 MG tablet Take 1 tablet (200 mg total) by mouth daily.    . folic acid (FOLVITE) 1 MG tablet Take 1 tablet (1 mg total) by mouth at bedtime. 90 tablet 3  . furosemide (LASIX) 20 MG tablet Take 20 mg by  mouth daily as needed. For weight over 205    . levothyroxine (SYNTHROID, LEVOTHROID) 75 MCG tablet Take 75 mcg by mouth daily before breakfast.    . lisinopril (PRINIVIL,ZESTRIL) 10 MG tablet Take 10 mg by mouth at bedtime.   9  . metoprolol tartrate (LOPRESSOR) 25 MG tablet Take 0.5 tablets (12.5 mg total) by mouth 2 (two) times daily. 180 tablet 3  . Multiple Vitamin (MULTIVITAMIN WITH MINERALS) TABS tablet Take 1 tablet by mouth daily.    . Omega-3 Fatty Acids (FISH OIL) 1000 MG CAPS Take 1,000 mg by mouth at bedtime.     Marland Kitchen warfarin (COUMADIN) 5 MG tablet TAKE AS DIRECTED BY COUMADIN CLINIC (Patient taking differently: 5 mg MWF, 2.5 mg AOD) 90 tablet 1   No current facility-administered medications on file prior to visit.     No Known Allergies  Objective:  There were no vitals filed for this visit.  General: Well developed, nourished, in no acute distress, alert and oriented x3   Dermatology: Skin is warm, dry and supple bilateral.Right hallux nail appears to be partially attached and severely thickened. (-) Erythema. (+) Edema. (-) serosanguous drainage present. The remaining nails appear unremarkable at this time. There are no open sores, lesions or other signs of infection present. Dry callus dorsal IPJ left 2nd toe with no acute signs of infection.   Vascular: Dorsalis Pedis artery and Posterior Tibial artery pedal pulses are 1/4 bilateral with immedate capillary fill time. No Pedal hair growth present. No lower extremity edema.   Neruologic: Grossly intact via light touch bilateral.  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 Muscular strength within normal limits in all groups bilateral.   Assesement and Plan: Problem List Items Addressed This Visit    None    Visit Diagnoses    Traumatic  avulsion of nail plate of toe, initial encounter    -  Primary   Toe pain, right          -Discussed treatment alternatives and plan of care; Explained permanent/temporary nail avulsion and post procedure course to patient. Patient opt for right total permanent nail avulsion.  - After a verbal consent, injected 6 ml of a 50:50 mixture of 2% plain  lidocaine and 0.5% plain marcaine in a normal hallux block fashion. Next, a betadine prep was performed. Anesthesia was tested and found to be appropriate. The right hallux nail was removed and cleared from the field. The area was curretted for any remaining nail or spicules. Phenol application performed and the area was then flushed with alcohol and dressed with antibiotic cream and a dry sterile dressing. -Patient was instructed to leave the dressing intact for today and begin soaking in a weak solution of betadine and water tomorrow. Patient was instructed to soak for 15 minutes each day and apply neosporin and a gauze or bandaid dressing each day. -Patient was instructed to monitor the toe for signs of infection and return to office if toe becomes red, hot or swollen. -Advised ice, elevation, and tylenol or motrin if needed for pain.  -Patient is to return in 1-2 weeks for follow up care or sooner if problems arise. Will discuss surgery for left foot at next visit.   Landis Martins, DPM

## 2016-03-22 NOTE — Patient Instructions (Signed)

## 2016-03-24 ENCOUNTER — Ambulatory Visit (HOSPITAL_COMMUNITY): Payer: Medicare Other

## 2016-03-24 ENCOUNTER — Encounter (HOSPITAL_COMMUNITY)
Admission: RE | Admit: 2016-03-24 | Discharge: 2016-03-24 | Disposition: A | Discharge status: Home / Self Care | Payer: Medicare Other | Source: Ambulatory Visit | Attending: Internal Medicine | Admitting: Internal Medicine

## 2016-03-24 DIAGNOSIS — I5022 Chronic systolic (congestive) heart failure: Secondary | ICD-10-CM

## 2016-03-26 ENCOUNTER — Ambulatory Visit (HOSPITAL_COMMUNITY): Payer: Medicare Other

## 2016-03-26 ENCOUNTER — Encounter (HOSPITAL_COMMUNITY)
Admission: RE | Admit: 2016-03-26 | Discharge: 2016-03-26 | Disposition: A | Discharge status: Home / Self Care | Payer: Medicare Other | Source: Ambulatory Visit | Attending: Internal Medicine | Admitting: Internal Medicine

## 2016-03-26 DIAGNOSIS — I5022 Chronic systolic (congestive) heart failure: Secondary | ICD-10-CM

## 2016-03-26 DIAGNOSIS — Z9889 Other specified postprocedural states: Secondary | ICD-10-CM | POA: Diagnosis not present

## 2016-03-26 NOTE — Progress Notes (Signed)
Reviewed home exercise with pt today.  Pt plans to walk  for exercise.  Reviewed THR, pulse, RPE, sign and symptoms, and when to call 911 or MD.  Also discussed weather considerations and indoor options.  Pt voiced understanding.    Austin Hayashida,MS,ACSM RCEP 

## 2016-03-31 ENCOUNTER — Ambulatory Visit (HOSPITAL_COMMUNITY): Payer: Medicare Other

## 2016-03-31 ENCOUNTER — Encounter (HOSPITAL_COMMUNITY)
Admission: RE | Admit: 2016-03-31 | Discharge: 2016-03-31 | Disposition: A | Discharge status: Home / Self Care | Payer: Medicare Other | Source: Ambulatory Visit | Attending: Internal Medicine | Admitting: Internal Medicine

## 2016-03-31 DIAGNOSIS — I5022 Chronic systolic (congestive) heart failure: Secondary | ICD-10-CM

## 2016-04-01 ENCOUNTER — Ambulatory Visit (INDEPENDENT_AMBULATORY_CARE_PROVIDER_SITE_OTHER): Payer: Medicare Other | Admitting: Cardiovascular Disease

## 2016-04-01 DIAGNOSIS — Z5181 Encounter for therapeutic drug level monitoring: Secondary | ICD-10-CM

## 2016-04-01 LAB — POCT INR: INR: 2.3

## 2016-04-02 ENCOUNTER — Encounter (HOSPITAL_COMMUNITY)
Admission: RE | Admit: 2016-04-02 | Discharge: 2016-04-02 | Disposition: A | Discharge status: Home / Self Care | Payer: Medicare Other | Source: Ambulatory Visit | Attending: Internal Medicine | Admitting: Internal Medicine

## 2016-04-02 ENCOUNTER — Ambulatory Visit (HOSPITAL_COMMUNITY): Payer: Medicare Other

## 2016-04-02 DIAGNOSIS — I5022 Chronic systolic (congestive) heart failure: Secondary | ICD-10-CM

## 2016-04-05 ENCOUNTER — Encounter (HOSPITAL_COMMUNITY)
Admission: RE | Admit: 2016-04-05 | Discharge: 2016-04-05 | Disposition: A | Discharge status: Home / Self Care | Payer: Medicare Other | Source: Ambulatory Visit | Attending: Internal Medicine | Admitting: Internal Medicine

## 2016-04-05 ENCOUNTER — Ambulatory Visit (HOSPITAL_COMMUNITY): Payer: Medicare Other

## 2016-04-05 DIAGNOSIS — I5022 Chronic systolic (congestive) heart failure: Secondary | ICD-10-CM | POA: Diagnosis not present

## 2016-04-06 ENCOUNTER — Encounter: Payer: Self-pay | Admitting: Sports Medicine

## 2016-04-06 ENCOUNTER — Ambulatory Visit (INDEPENDENT_AMBULATORY_CARE_PROVIDER_SITE_OTHER): Payer: Medicare Other | Admitting: Sports Medicine

## 2016-04-06 VITALS — BP 100/55 | HR 45 | Ht 73.5 in | Wt 187.0 lb

## 2016-04-06 DIAGNOSIS — M79674 Pain in right toe(s): Secondary | ICD-10-CM

## 2016-04-06 DIAGNOSIS — S91209D Unspecified open wound of unspecified toe(s) with damage to nail, subsequent encounter: Secondary | ICD-10-CM

## 2016-04-06 DIAGNOSIS — Z9889 Other specified postprocedural states: Secondary | ICD-10-CM

## 2016-04-06 NOTE — Patient Instructions (Signed)

## 2016-04-06 NOTE — Progress Notes (Signed)
Subjective: Austin [name] is a 75 y.o. male patient returns to office today for follow up evaluation after having Right hallux total permanent nail avulsion performed on 03-22-16. Patient has been soaking using betadine and applying topical antibiotic covered with bandaid daily. Patient denies fever/chills/nausea/vomitting/any other related constitutional symptoms at this time.  Patient Active Problem List   Diagnosis Date Noted  . Tachycardia 10/27/2015  . Hypothyroidism 10/27/2015  . H/O mitral valve replacement with mechanical valve   . V tach ([place]) 10/26/2015  . Ventricular tachycardia ([place]) 08/22/2015  . Encounter for therapeutic drug monitoring 08/20/2013  . Mitral valve disorders 05/15/2013  . Heart valve replaced by other means 05/15/2013  . Cardiac device, implant, or graft infection or inflammation ([place]) 06/28/2011  . Cardiomyopathy 06/28/2011  . 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 Chronic systolic heart failure ([place]) 04/15/2009  . HYPERCHOLESTEROLEMIA 11/27/2008  . ERECTILE DYSFUNCTION 11/27/2008  . ABSCESS, SPINE, EPIDURAL 11/27/2008  . HEARING LOSS 11/27/2008  . MITRAL REGURGITATION, SEVERE 11/27/2008  . VENTRICULAR TACHYCARDIA 11/27/2008  . Atrial fibrillation ([place]) 11/27/2008  . LEFT VENTRICULAR FUNCTION, DECREASED 11/27/2008  . SYNCOPE 11/27/2008  . SLEEP APNEA 11/27/2008  . MITRAL VALVE REPLACEMENT, HX OF 11/27/2008    Current Outpatient Prescriptions on File Prior to Visit  Medication Sig Dispense Refill  . acetaminophen (TYLENOL) 500 MG tablet Take 1,000 mg by mouth every 6 (six) hours as needed (pain).    Marland Kitchen amiodarone (PACERONE) 200 MG tablet Take 1 tablet (200 mg total) by mouth daily.    . folic acid (FOLVITE) 1 MG tablet Take 1 tablet (1 mg total) by mouth at bedtime. 90 tablet 3  . furosemide (LASIX) 20 MG tablet Take 20 mg by mouth daily as needed. For weight over 205    . levothyroxine (SYNTHROID, LEVOTHROID) 75 MCG  tablet Take 75 mcg by mouth daily before breakfast.    . lisinopril (PRINIVIL,ZESTRIL) 10 MG tablet Take 10 mg by mouth at bedtime.   9  . metoprolol tartrate (LOPRESSOR) 25 MG tablet Take 0.5 tablets (12.5 mg total) by mouth 2 (two) times daily. 180 tablet 3  . Multiple Vitamin (MULTIVITAMIN WITH MINERALS) TABS tablet Take 1 tablet by mouth daily.    . Omega-3 Fatty Acids (FISH OIL) 1000 MG CAPS Take 1,000 mg by mouth at bedtime.     Marland Kitchen warfarin (COUMADIN) 5 MG tablet TAKE AS DIRECTED BY COUMADIN CLINIC (Patient taking differently: 5 mg MWF, 2.5 mg AOD) 90 tablet 1   No current facility-administered medications on file prior to visit.     No Known Allergies  Objective:  General: Well developed, nourished, in no acute distress, alert and oriented x3   Dermatology: Skin is warm, dry and supple bilateral. Right hallux nail bed appears to be clean, dry, with mild granular tissue and surrounding eschar/scab. (-) Erythema. (-) Edema. (-) serosanguous drainage present. The remaining nails appear unremarkable at this time. There are no other lesions or other signs of infection present.  Neurovascular status: Intact. No lower extremity swelling; No pain with calf compression bilateral.  Musculoskeletal: Decreased tenderness to palpation of the right hallux nail bed. Muscular strength within normal limits bilateral.   Assesement and Plan: Problem List Items Addressed This Visit    None    Visit Diagnoses    S/P nail surgery    -  Primary   Traumatic avulsion of nail plate of toe, subsequent encounter       Toe pain, right          -  Examined patient  -Cleansed right hallux nail bed and gently scrubbed with peroxide and q-tip/curetted away eschar at site and applied antibiotic cream covered with bandaid.  -Discussed plan of care with patient. -Patient to now begin soaking in a weak solution of Epsom salt and warm water. Patient was instructed to soak for 15-20 minutes each day until the toe  appears normal and there is no drainage, redness, tenderness, or swelling at the procedure site, and apply neosporin and a gauze or bandaid dressing each day as needed. May leave open to air at night. -Educated patient on long term care after nail surgery. -Patient was instructed to monitor the toe for reoccurrence and signs of infection; Patient advised to return to office or go to ER if toe becomes red, hot or swollen. -Patient is to return as needed or sooner if problems arise.  Landis Martins, DPM

## 2016-04-07 ENCOUNTER — Ambulatory Visit (HOSPITAL_COMMUNITY): Payer: Medicare Other

## 2016-04-07 ENCOUNTER — Encounter (HOSPITAL_COMMUNITY)
Admission: RE | Admit: 2016-04-07 | Discharge: 2016-04-07 | Disposition: A | Discharge status: Home / Self Care | Payer: Medicare Other | Source: Ambulatory Visit | Attending: Internal Medicine | Admitting: Internal Medicine

## 2016-04-07 DIAGNOSIS — I5022 Chronic systolic (congestive) heart failure: Secondary | ICD-10-CM

## 2016-04-07 NOTE — Progress Notes (Signed)
[name] 75 y.o. male Nutrition Note Spoke with pt. Nutrition Plan and Nutrition Survey goals reviewed with pt. Pt is not following the Therapeutic Lifestyle Changes diet. Pt feels he is eating "pretty healthy; I'm married to a nurse." Pt with dx of CHF. Per discussion, pt does not use canned/convenience foods often. Pt rarely adds salt to food. Pt eats out frequently. Sodium content of foods when eating out and suggestions for decreasing sodium when eating out reviewed with pt. Pt reports he has been on Coumadin "for about 18 years now." Pt states he is following a diet with Consistent vitamin K intake. Pt expressed understanding of the information reviewed. Pt aware of nutrition education classes offered.  No results found for: HGBA1C Wt Readings from Last 3 Encounters:  04/06/16 187 lb (84.8 kg)  03/12/16 186 lb 6.4 oz (84.6 kg)  03/09/16 189 lb 9.5 oz (86 kg)   Nutrition Diagnosis ? Food-and nutrition-related knowledge deficit related to lack of exposure to information as related to diagnosis of: ? CVD  Nutrition Intervention ? Pt's individual nutrition plan reviewed with pt. ? Benefits of adopting Therapeutic Lifestyle Changes discussed when Medficts reviewed. ? Pt to attend the Portion Distortion class  ? Pt given handouts for: ? Nutrition I class ? Nutrition II class  ? Continue client-centered nutrition education by RD, as part of interdisciplinary care. Goal(s) ? Pt to identify and limit food sources of saturated fat, trans fat, and sodium Monitor and Evaluate progress toward nutrition goal with team. Derek Mound, M.Ed, RD, LDN, CDE 04/07/2016 11:40 AM

## 2016-04-08 NOTE — Progress Notes (Signed)
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  acetaminophen (TYLENOL) 500 MG tablet, Take 1,000 mg by mouth every 6 (six) hours as needed (pain)., Disp: , Rfl:  .  amiodarone (PACERONE) 200 MG tablet, Take 1 tablet (200 mg total) by mouth daily., Disp: , Rfl:  .  folic acid (FOLVITE) 1 MG tablet, Take 1 tablet (1 mg total) by mouth at bedtime., Disp: 90 tablet, Rfl: 3 .  furosemide (LASIX) 20 MG tablet, Take 20 mg by mouth daily as needed. For weight over 205, Disp: , Rfl:  .  levothyroxine (SYNTHROID, LEVOTHROID) 75 MCG tablet, Take 75 mcg by mouth daily before breakfast., Disp: , Rfl:  .  lisinopril (PRINIVIL,ZESTRIL) 10 MG tablet, Take 10 mg by mouth at bedtime. , Disp: , Rfl: 9 .  metoprolol tartrate (LOPRESSOR) 25 MG tablet, Take 0.5 tablets (12.5 mg total) by mouth 2 (two) times daily., Disp: 180 tablet, Rfl: 3 .  Multiple Vitamin (MULTIVITAMIN WITH MINERALS) TABS tablet, Take 1 tablet by mouth daily., Disp: , Rfl:  .  Omega-3 Fatty Acids (FISH OIL) 1000 MG CAPS, Take 1,000 mg by mouth at bedtime. , Disp: , Rfl:  .  warfarin (COUMADIN) 5 MG tablet, TAKE AS DIRECTED BY COUMADIN CLINIC (Patient taking differently: 5 mg MWF, 2.5 mg AOD), Disp: 90 tablet, Rfl: 1  Past Medical History: Past Medical History:  Diagnosis Date  . Atrial fibrillation ([place])   . 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 Conductive hearing loss   . Intraspinal abscess   . 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 Nonischemic cardiomyopathy ([place])   . Psychosexual dysfunction with inhibited sexual excitement   . S/P mitral valve replacement   . Syncope and collapse   . Unspecified sleep apnea    last sleep study 11/07  . Ventricular tachycardia ([facility])     Tobacco Use: History  Smoking Status  . Never Smoker  Smokeless Tobacco  . Never Used    Labs: Recent Review Flowsheet Data    Labs for ITP Cardiac and Pulmonary Rehab Latest Ref Rng & Units 08/22/2015 08/23/2015   Cholestrol 0 - 200 mg/dL - 158   LDLCALC 0 - 99 mg/dL - 82   HDL >40 mg/dL - 50   Trlycerides <150 mg/dL - 130   TCO2 0 - 100 mmol/L 23 -      Capillary Blood Glucose: Lab Results  Component Value Date   GLUCAP 68 08/23/2015   GLUCAP 129 (H) 06/27/2011   GLUCAP 97 06/27/2011   GLUCAP 111 (H) 06/27/2011   GLUCAP 99 06/27/2011     Exercise Target Goals:    Exercise Program Goal: Individual exercise prescription set with THRR, safety & activity barriers. Participant demonstrates ability to understand and report RPE using BORG scale, to self-measure pulse accurately, and to  acknowledge the importance of the exercise prescription.  Exercise Prescription Goal: Starting with aerobic activity 30 plus minutes a day, 3 days per week for initial exercise prescription. Provide home exercise prescription and guidelines that participant acknowledges understanding prior to discharge.  Activity Barriers & Risk Stratification:     Activity Barriers & Cardiac Risk Stratification - 03/31/16 1032      Activity Barriers & Cardiac Risk Stratification   Activity Barriers None      6 Minute Walk:     6 Minute Walk    Row Name 03/09/16 1635         6 Minute Walk   Phase Initial     Distance 1412 feet     Walk Time 6 minutes      # of Rest Breaks 0     MPH 2.67     METS 3.09     RPE 11     VO2 Peak 10.83     Symptoms No     Resting HR 57 bpm     Resting BP 128/70     Max Ex. HR 85 bpm     Max Ex. BP 138/60     2 Minute Post BP 108/70        Initial Exercise Prescription:     Initial Exercise Prescription - 03/09/16 1600      Date of Initial Exercise RX and Referring Provider   Date 03/09/16   Referring Provider [name] MD     Treadmill   MPH 2.3   Grade 1   Minutes 10   METs 3.08     Bike   Level 1   Minutes 10   METs 3.22     NuStep   Level 3   Minutes 10   METs 2.8     Prescription Details   Frequency (times per week) 3   Duration Progress to 30 minutes of continuous aerobic without signs/symptoms of physical distress     Intensity   THRR 40-80% of Max Heartrate 59-118   Ratings of Perceived Exertion 11-13   Perceived Dyspnea 0-4     Progression   Progression Continue to progress workloads to maintain intensity without signs/symptoms of physical distress.     Resistance Training   Training Prescription Yes   Weight 2lbs   Reps 10-12      Perform Capillary Blood Glucose checks as needed.  Exercise Prescription Changes:      Exercise Prescription Changes    Row Name 04/05/16 0700             Exercise Review   Progression Yes         Response to Exercise   Blood Pressure (Admit) 100/72       Blood Pressure (Exercise) 118/70       Blood Pressure (Exit) 124/70       Heart Rate (Admit) 62 bpm       Heart Rate (Exercise) 101 bpm       Heart Rate (Exit) 62 bpm       Rating of Perceived Exertion (Exercise) 12       Symptoms none       Comments Home exercise reviewed on 03/17/16.       Duration Progress to 30 minutes of continuous aerobic without signs/symptoms of physical distress       Intensity THRR unchanged         Progression   Progression Continue to progress workloads to maintain intensity without  signs/symptoms of physical distress.        Average METs 3.6         Resistance Training   Training Prescription Yes       Weight 4lbs         Interval Training   Interval Training No         Treadmill   MPH 2.8       Grade 1       Minutes 10       METs 3.53         Bike   Level 1.4       Minutes 10       METs 4.07         NuStep   Level 4       Minutes 10       METs 3.3         Home Exercise Plan   Plans to continue exercise at Home       Frequency Add 2 additional days to program exercise sessions.          Exercise Comments:      Exercise Comments    Row Name 04/09/16 1415           Exercise Comments Doing well with exercise. Pt walking 30 minutes, 2 days/week in addition to cardiac rehab.          Discharge Exercise Prescription (Final Exercise Prescription Changes):     Exercise Prescription Changes - 04/05/16 0700      Exercise Review   Progression Yes     Response to Exercise   Blood Pressure (Admit) 100/72   Blood Pressure (Exercise) 118/70   Blood Pressure (Exit) 124/70   Heart Rate (Admit) 62 bpm   Heart Rate (Exercise) 101 bpm   Heart Rate (Exit) 62 bpm   Rating of Perceived Exertion (Exercise) 12   Symptoms none   Comments Home exercise reviewed on 03/17/16.   Duration Progress to 30 minutes of continuous aerobic without signs/symptoms of physical distress   Intensity THRR unchanged     Progression   Progression Continue to progress workloads to maintain intensity without signs/symptoms of physical distress.   Average METs 3.6     Resistance Training   Training Prescription Yes   Weight 4lbs     Interval Training   Interval Training No     Treadmill   MPH 2.8   Grade 1   Minutes 10   METs 3.53     Bike   Level 1.4   Minutes 10   METs 4.07     NuStep   Level 4   Minutes 10   METs 3.3     Home Exercise Plan   Plans to continue exercise at Home   Frequency Add 2 additional days to program exercise sessions.      Nutrition:  Target Goals: Understanding  of nutrition guidelines, daily intake of sodium 1500mg , cholesterol 200mg , calories 30% from fat and 7% or less from saturated fats, daily to have 5 or more servings of fruits and vegetables.  Biometrics:     Pre Biometrics - 03/09/16 1634      Pre Biometrics   Height 6\' 1"  (1.854 m)   Weight 189 lb 9.5 oz (86 kg)   Waist Circumference 36 inches   Hip Circumference 40.75 inches   Waist to Hip Ratio 0.88 %   BMI (Calculated) 25.1   Triceps Skinfold 12 mm   % Body  Fat 23.5 %   Grip Strength 39.5 kg   Flexibility 8 in   Single Leg Stand 1.03 seconds       Nutrition Therapy Plan and Nutrition Goals:     Nutrition Therapy & Goals - 03/10/16 1007      Nutrition Therapy   Diet Therapeutic Lifestyle Changes     Personal Nutrition Goals   Personal Goal #1 Maintain wt while in [place], educate and counsel regarding individualized specific dietary modifications aiming towards targeted core components such as weight, hypertension, lipid management, diabetes, heart failure and other comorbidities.   Expected Outcomes Short Term Goal: Understand basic principles of dietary content, such as calories, fat, sodium, cholesterol and nutrients.;Long Term Goal: Adherence to prescribed nutrition plan.      Nutrition Discharge: Nutrition Scores:     Nutrition Assessments - 04/07/16 1201      MEDFICTS Scores   Pre Score 70      Nutrition Goals Re-Evaluation:   Psychosocial: Target Goals: Acknowledge presence or absence of depression, maximize coping skills, provide positive support system. Participant is able to verbalize types and ability to use techniques and skills needed for reducing stress and depression.  Initial Review & Psychosocial Screening:     Initial Psych Review & Screening - 03/11/16 [place]? Yes     Barriers   Psychosocial barriers to participate in program There are  no identifiable barriers or psychosocial needs.     Screening Interventions   Interventions Encouraged to exercise      Quality of Life Scores:     Quality of Life - 03/09/16 1638      Quality of Life Scores   Health/Function Pre 23.63 %   Socioeconomic Pre 29.64 %   Psych/Spiritual Pre 27.21 %   Family Pre 30 %   GLOBAL Pre 26.54 %      PHQ-9: Recent Review Flowsheet Data    Depression screen [facility] 2/9 03/15/2016   Decreased Interest 0   Down, Depressed, Hopeless 0   PHQ - 2 Score 0      Psychosocial Evaluation and Intervention:   Psychosocial Re-Evaluation:     Psychosocial Re-Evaluation    [place] Name 04/08/16 1401             Psychosocial Re-Evaluation   Interventions Encouraged to attend Cardiac Rehabilitation for the exercise       Continued Psychosocial Services Needed No          Vocational Rehabilitation: Provide vocational rehab assistance to qualifying candidates.   Vocational Rehab Evaluation & Intervention:     Vocational Rehab - 03/09/16 1535      Initial Vocational Rehab Evaluation & Intervention   Assessment shows need for Vocational Rehabilitation No      Education: Education Goals: Education classes will be provided on a weekly basis, covering required topics. Participant will state understanding/return demonstration of topics presented.  Learning Barriers/Preferences:     Learning Barriers/Preferences - 03/09/16 1347      Learning Barriers/Preferences   Learning Barriers None   Learning Preferences Audio      Education Topics: Count Your Pulse:  -Group instruction provided by verbal instruction, demonstration, patient participation and written materials to support subject.  Instructors address importance of being able to find your pulse and how to count your pulse when at home without a heart monitor.  Patients get hands on  experience counting their pulse with staff help and individually.   Heart Attack, Angina, and Risk  Factor Modification:  -Group instruction provided by verbal instruction, video, and written materials to support subject.  Instructors address signs and symptoms of angina and heart attacks.    Also discuss risk factors for heart disease and how to make changes to improve heart health risk factors. Flowsheet Row CARDIAC REHAB PHASE II EXERCISE from 04/07/2016 in [facility]  Date  03/17/16  Instruction Review Code  2- meets goals/outcomes      Functional Fitness:  -Group instruction provided by verbal instruction, demonstration, patient participation, and written materials to support subject.  Instructors address safety measures for doing things around the house.  Discuss how to get up and down off the floor, how to pick things up properly, how to safely get out of a chair without assistance, and balance training.   Meditation and Mindfulness:  -Group instruction provided by verbal instruction, patient participation, and written materials to support subject.  Instructor addresses importance of mindfulness and meditation practice to help reduce stress and improve awareness.  Instructor also leads participants through a meditation exercise.  Flowsheet Row CARDIAC REHAB PHASE II EXERCISE from 04/07/2016 in [place]  Date  03/24/16  Educator  bob hamilton  Instruction Review Code  2- meets goals/outcomes      Stretching for Flexibility and Mobility:  -Group instruction provided by verbal instruction, patient participation, and written materials to support subject.  Instructors lead participants through series of stretches that are designed to increase flexibility thus improving mobility.  These stretches are additional exercise for major muscle groups that are typically performed during regular warm up and cool down.   Hands Only CPR Anytime:  -Group instruction provided by verbal instruction, video, patient participation and  written materials to support subject.  Instructors co-teach with AHA video for hands only CPR.  Participants get hands on experience with mannequins.   Nutrition I class: Heart Healthy Eating:  -Group instruction provided by PowerPoint slides, verbal discussion, and written materials to support subject matter. The instructor gives an explanation and review of the Therapeutic Lifestyle Changes diet recommendations, which includes a discussion on lipid goals, dietary fat, sodium, fiber, plant stanol/sterol esters, sugar, and the components of a well-balanced, healthy diet. Flowsheet Row CARDIAC REHAB PHASE II EXERCISE from 04/07/2016 in [place]  Date  04/07/16  Educator  RD  Instruction Review Code  Not applicable [class handouts given]      Nutrition II class: Lifestyle Skills:  -Group instruction provided by PowerPoint slides, verbal discussion, and written materials to support subject matter. The instructor gives an explanation and review of label reading, grocery shopping for heart health, heart healthy recipe modifications, and ways to make healthier choices when eating out. Flowsheet Row CARDIAC REHAB PHASE II EXERCISE from 04/07/2016 in [place]  Date  04/07/16  Educator  RD  Instruction Review Code  Not applicable [class handouts given]      Diabetes Question & Answer:  -Group instruction provided by PowerPoint slides, verbal discussion, and written materials to support subject matter. The instructor gives an explanation and review of diabetes co-morbidities, pre- and post-prandial blood glucose goals, pre-exercise blood glucose goals, signs, symptoms, and treatment of hypoglycemia and hyperglycemia, and foot care basics.   Diabetes Blitz:  -Group instruction provided by PowerPoint slides, verbal discussion, and written materials to support subject matter.  The instructor gives an explanation and review of the  physiology behind type 1 and type 2 diabetes, diabetes medications and rational behind using different medications, pre- and post-prandial blood glucose recommendations and Hemoglobin A1c goals, diabetes diet, and exercise including blood glucose guidelines for exercising safely.    Portion Distortion:  -Group instruction provided by PowerPoint slides, verbal discussion, written materials, and food models to support subject matter. The instructor gives an explanation of serving size versus portion size, changes in portions sizes over the last 20 years, and what consists of a serving from each food group.   Stress Management:  -Group instruction provided by verbal instruction, video, and written materials to support subject matter.  Instructors review role of stress in heart disease and how to cope with stress positively.     Exercising on Your Own:  -Group instruction provided by verbal instruction, power point, and written materials to support subject.  Instructors discuss benefits of exercise, components of exercise, frequency and intensity of exercise, and end points for exercise.  Also discuss use of nitroglycerin and activating EMS.  Review options of places to exercise outside of rehab.  Review guidelines for sex with heart disease.   Cardiac Drugs I:  -Group instruction provided by verbal instruction and written materials to support subject.  Instructor reviews cardiac drug classes: antiplatelets, anticoagulants, beta blockers, and statins.  Instructor discusses reasons, side effects, and lifestyle considerations for each drug class.   Cardiac Drugs II:  -Group instruction provided by verbal instruction and written materials to support subject.  Instructor reviews cardiac drug classes: angiotensin converting enzyme inhibitors (ACE-I), angiotensin II receptor blockers (ARBs), nitrates, and calcium channel blockers.  Instructor discusses reasons, side effects, and lifestyle considerations  for each drug class.   Anatomy and Physiology of the Circulatory System:  -Group instruction provided by verbal instruction, video, and written materials to support subject.  Reviews functional anatomy of heart, how it relates to various diagnoses, and what role the heart plays in the overall system.   Knowledge Questionnaire Score:     Knowledge Questionnaire Score - 03/09/16 1638      Knowledge Questionnaire Score   Pre Score 23/24      Core Components/Risk Factors/Patient Goals at Admission:     Personal Goals and Risk Factors at Admission - 03/09/16 1535      Core Components/Risk Factors/Patient Goals on Admission    Weight Management Weight Maintenance;Yes   Intervention Weight Management: Develop a combined nutrition and exercise program designed to reach desired caloric intake, while maintaining appropriate intake of nutrient and fiber, sodium and fats, and appropriate energy expenditure required for the weight goal.;Weight Management: Provide education and appropriate resources to help participant work on and attain dietary goals.   Admit Weight 189 lb 9.5 oz (86 kg)   Expected Outcomes Weight Maintenance: Understanding of the daily nutrition guidelines, which includes 25-35% calories from fat, 7% or less cal from saturated fats, less than 200mg  cholesterol, less than 1.5gm of sodium, & 5 or more servings of fruits and vegetables daily   Personal Goal Other Yes   Personal Goal Improve leg strength. be able to breathe better. Be able to get back to pre-hospitalization activities.   Intervention Provide individualized aerobic and resistance training program to help achieve goal of increased leg strength, and endurance, so that patient is better able to do ADLs and improve cardiorespiratory fitness, so that pt is abel to breathe better.   Expected Outcomes Be able to resume ADLs that  he was able to do previously and to have less shortness of breath.      Core Components/Risk  Factors/Patient Goals Review:      Goals and Risk Factor Review    Row Name 04/09/16 1417             Core Components/Risk Factors/Patient Goals Review   Personal Goals Review Other       Review Patient walking 30 minutes, 2 days/week at home. Pt is feeling stronger and is breathing better since starting the program.       Expected Outcomes Maintain aerobic exercise 4-5 days/week to achieve fitness goals.          Core Components/Risk Factors/Patient Goals at Discharge (Final Review):      Goals and Risk Factor Review - 04/09/16 1417      Core Components/Risk Factors/Patient Goals Review   Personal Goals Review Other   Review Patient walking 30 minutes, 2 days/week at home. Pt is feeling stronger and is breathing better since starting the program.   Expected Outcomes Maintain aerobic exercise 4-5 days/week to achieve fitness goals.      ITP Comments:     ITP Comments    Row Name 03/09/16 1338           ITP Comments Medical Director- Dr. Fransico Him, MD          Comments: [name] is making expected progress toward personal goals after completing 10 sessions. Recommend continued exercise and life style modification education including  stress management and relaxation techniques to decrease cardiac risk profile. Barnet Pall, RN,BSN 04/09/2016 5:02 PM

## 2016-04-09 ENCOUNTER — Ambulatory Visit (HOSPITAL_COMMUNITY): Payer: Medicare Other

## 2016-04-09 ENCOUNTER — Encounter (HOSPITAL_COMMUNITY)
Admission: RE | Admit: 2016-04-09 | Discharge: 2016-04-09 | Disposition: A | Discharge status: Home / Self Care | Payer: Medicare Other | Source: Ambulatory Visit | Attending: Internal Medicine | Admitting: Internal Medicine

## 2016-04-09 DIAGNOSIS — I5022 Chronic systolic (congestive) heart failure: Secondary | ICD-10-CM

## 2016-04-12 ENCOUNTER — Encounter (HOSPITAL_COMMUNITY)
Admission: RE | Admit: 2016-04-12 | Discharge: 2016-04-12 | Disposition: A | Discharge status: Home / Self Care | Payer: Medicare Other | Source: Ambulatory Visit | Attending: Internal Medicine | Admitting: Internal Medicine

## 2016-04-12 ENCOUNTER — Ambulatory Visit (HOSPITAL_COMMUNITY): Payer: Medicare Other

## 2016-04-12 ENCOUNTER — Encounter: Payer: Medicare Other | Admitting: Internal Medicine

## 2016-04-12 DIAGNOSIS — I5022 Chronic systolic (congestive) heart failure: Secondary | ICD-10-CM

## 2016-04-14 ENCOUNTER — Encounter (HOSPITAL_COMMUNITY)
Admission: RE | Admit: 2016-04-14 | Discharge: 2016-04-14 | Disposition: A | Discharge status: Home / Self Care | Payer: Medicare Other | Source: Ambulatory Visit | Attending: Internal Medicine | Admitting: Internal Medicine

## 2016-04-14 ENCOUNTER — Ambulatory Visit (HOSPITAL_COMMUNITY): Payer: Medicare Other

## 2016-04-14 DIAGNOSIS — I5022 Chronic systolic (congestive) heart failure: Secondary | ICD-10-CM

## 2016-04-15 ENCOUNTER — Ambulatory Visit (INDEPENDENT_AMBULATORY_CARE_PROVIDER_SITE_OTHER): Payer: Medicare Other | Admitting: Cardiovascular Disease

## 2016-04-15 DIAGNOSIS — Z5181 Encounter for therapeutic drug level monitoring: Secondary | ICD-10-CM

## 2016-04-15 LAB — POCT INR: INR: 4.5

## 2016-04-16 ENCOUNTER — Encounter (HOSPITAL_COMMUNITY)
Admission: RE | Admit: 2016-04-16 | Discharge: 2016-04-16 | Disposition: A | Discharge status: Home / Self Care | Payer: Medicare Other | Source: Ambulatory Visit | Attending: Internal Medicine | Admitting: Internal Medicine

## 2016-04-16 ENCOUNTER — Ambulatory Visit (HOSPITAL_COMMUNITY): Payer: Medicare Other

## 2016-04-16 DIAGNOSIS — I5022 Chronic systolic (congestive) heart failure: Secondary | ICD-10-CM

## 2016-04-16 NOTE — Progress Notes (Signed)
Patient reported feeling lightheaded on the treadmill . Exercise stopped. Blood pressure 132/70. Telemetry rhythm Chronic atrial fibrillation rate 82. Patient given Gatorade. Sitting blood pressure 1118/60 sitting. Standing blood sugar 118/60. Symptoms resolved after rest. Austin [name] reports that he feels lightheaded 2- 3 times a week. Austin Stains NP called and notified. Austin [name] said Austin [name] is okay to continue with exercise. Austin [name] was instructed to call to make an appointment to follow up with  Dr Lovena Le if he continues to have symptoms.Harrell Gave RN BSN

## 2016-04-19 ENCOUNTER — Ambulatory Visit (HOSPITAL_COMMUNITY): Payer: Medicare Other

## 2016-04-19 ENCOUNTER — Encounter (HOSPITAL_COMMUNITY)
Admission: RE | Admit: 2016-04-19 | Discharge: 2016-04-19 | Disposition: A | Discharge status: Home / Self Care | Payer: Medicare Other | Source: Ambulatory Visit | Attending: Internal Medicine | Admitting: Internal Medicine

## 2016-04-19 DIAGNOSIS — I5022 Chronic systolic (congestive) heart failure: Secondary | ICD-10-CM

## 2016-04-21 ENCOUNTER — Encounter (HOSPITAL_COMMUNITY)
Admission: RE | Admit: 2016-04-21 | Discharge: 2016-04-21 | Disposition: A | Discharge status: Home / Self Care | Payer: Medicare Other | Source: Ambulatory Visit | Attending: Internal Medicine | Admitting: Internal Medicine

## 2016-04-21 ENCOUNTER — Ambulatory Visit (HOSPITAL_COMMUNITY): Payer: Medicare Other

## 2016-04-21 DIAGNOSIS — I5022 Chronic systolic (congestive) heart failure: Secondary | ICD-10-CM | POA: Diagnosis not present

## 2016-04-23 ENCOUNTER — Encounter (HOSPITAL_COMMUNITY)
Admission: RE | Admit: 2016-04-23 | Discharge: 2016-04-23 | Disposition: A | Discharge status: Home / Self Care | Payer: Medicare Other | Source: Ambulatory Visit | Attending: Internal Medicine | Admitting: Internal Medicine

## 2016-04-23 ENCOUNTER — Ambulatory Visit (HOSPITAL_COMMUNITY): Payer: Medicare Other

## 2016-04-23 DIAGNOSIS — I5022 Chronic systolic (congestive) heart failure: Secondary | ICD-10-CM

## 2016-04-26 ENCOUNTER — Encounter (HOSPITAL_COMMUNITY)
Admission: RE | Admit: 2016-04-26 | Discharge: 2016-04-26 | Disposition: A | Discharge status: Home / Self Care | Payer: Medicare Other | Source: Ambulatory Visit | Attending: Internal Medicine | Admitting: Internal Medicine

## 2016-04-26 ENCOUNTER — Ambulatory Visit (HOSPITAL_COMMUNITY): Payer: Medicare Other

## 2016-04-26 DIAGNOSIS — Z9889 Other specified postprocedural states: Secondary | ICD-10-CM | POA: Diagnosis not present

## 2016-04-26 DIAGNOSIS — I5022 Chronic systolic (congestive) heart failure: Secondary | ICD-10-CM | POA: Insufficient documentation

## 2016-04-28 ENCOUNTER — Encounter (HOSPITAL_COMMUNITY)
Admission: RE | Admit: 2016-04-28 | Discharge: 2016-04-28 | Disposition: A | Discharge status: Home / Self Care | Payer: Medicare Other | Source: Ambulatory Visit | Attending: Internal Medicine | Admitting: Internal Medicine

## 2016-04-28 ENCOUNTER — Ambulatory Visit (HOSPITAL_COMMUNITY): Payer: Medicare Other

## 2016-04-28 DIAGNOSIS — I5022 Chronic systolic (congestive) heart failure: Secondary | ICD-10-CM

## 2016-04-29 ENCOUNTER — Ambulatory Visit (INDEPENDENT_AMBULATORY_CARE_PROVIDER_SITE_OTHER): Payer: Medicare Other | Admitting: Internal Medicine

## 2016-04-29 DIAGNOSIS — I059 Rheumatic mitral valve disease, unspecified: Secondary | ICD-10-CM

## 2016-04-29 DIAGNOSIS — Z5181 Encounter for therapeutic drug level monitoring: Secondary | ICD-10-CM

## 2016-04-29 LAB — POCT INR: INR: 3.5

## 2016-04-30 ENCOUNTER — Ambulatory Visit (HOSPITAL_COMMUNITY): Payer: Medicare Other

## 2016-04-30 ENCOUNTER — Encounter (HOSPITAL_COMMUNITY)
Admission: RE | Admit: 2016-04-30 | Discharge: 2016-04-30 | Disposition: A | Discharge status: Home / Self Care | Payer: Medicare Other | Source: Ambulatory Visit | Attending: Internal Medicine | Admitting: Internal Medicine

## 2016-04-30 DIAGNOSIS — I5022 Chronic systolic (congestive) heart failure: Secondary | ICD-10-CM | POA: Diagnosis not present

## 2016-05-03 ENCOUNTER — Ambulatory Visit (HOSPITAL_COMMUNITY): Payer: Medicare Other

## 2016-05-03 ENCOUNTER — Encounter (HOSPITAL_COMMUNITY)
Admission: RE | Admit: 2016-05-03 | Discharge: 2016-05-03 | Disposition: A | Discharge status: Home / Self Care | Payer: Medicare Other | Source: Ambulatory Visit | Attending: Internal Medicine | Admitting: Internal Medicine

## 2016-05-03 DIAGNOSIS — I5022 Chronic systolic (congestive) heart failure: Secondary | ICD-10-CM | POA: Diagnosis not present

## 2016-05-05 ENCOUNTER — Encounter (HOSPITAL_COMMUNITY)
Admission: RE | Admit: 2016-05-05 | Discharge: 2016-05-05 | Disposition: A | Discharge status: Home / Self Care | Payer: Medicare Other | Source: Ambulatory Visit | Attending: Internal Medicine | Admitting: Internal Medicine

## 2016-05-05 ENCOUNTER — Ambulatory Visit (HOSPITAL_COMMUNITY): Payer: Medicare Other

## 2016-05-05 ENCOUNTER — Encounter: Payer: Self-pay | Admitting: Internal Medicine

## 2016-05-05 DIAGNOSIS — I5022 Chronic systolic (congestive) heart failure: Secondary | ICD-10-CM

## 2016-05-06 NOTE — Progress Notes (Signed)
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  acetaminophen (TYLENOL) 500 MG tablet, Take 1,000 mg by mouth every 6 (six) hours as needed (pain)., Disp: , Rfl:  .  amiodarone (PACERONE) 200 MG tablet, Take 1 tablet (200 mg total) by mouth daily., Disp: , Rfl:  .  folic acid (FOLVITE) 1 MG tablet, Take 1 tablet (1 mg total) by mouth at bedtime., Disp: 90 tablet, Rfl: 3 .  furosemide (LASIX) 20 MG tablet, Take 20 mg by mouth daily as needed. For weight over 205, Disp: , Rfl:  .  levothyroxine (SYNTHROID, LEVOTHROID) 75 MCG tablet, Take 75 mcg by mouth daily before breakfast., Disp: , Rfl:  .  lisinopril (PRINIVIL,ZESTRIL) 10 MG tablet, Take 10 mg by mouth at bedtime. , Disp: , Rfl: 9 .  metoprolol tartrate (LOPRESSOR) 25 MG tablet, Take 0.5 tablets (12.5 mg total) by mouth 2 (two) times daily., Disp: 180 tablet, Rfl: 3 .  Multiple Vitamin (MULTIVITAMIN WITH MINERALS) TABS tablet, Take 1 tablet by mouth daily., Disp: , Rfl:  .  Omega-3 Fatty Acids (FISH OIL) 1000 MG CAPS, Take 1,000 mg by mouth at bedtime. , Disp: , Rfl:  .  warfarin (COUMADIN) 5 MG tablet, TAKE AS DIRECTED BY COUMADIN CLINIC (Patient taking differently: 5 mg MWF, 2.5 mg AOD), Disp: 90 tablet, Rfl: 1  Past Medical History: Past Medical History:  Diagnosis Date  . Atrial fibrillation ([place])   . 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 Conductive hearing loss   . Intraspinal abscess   . 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 Nonischemic cardiomyopathy ([place])   . Psychosexual dysfunction with inhibited sexual excitement   . S/P mitral valve replacement   . Syncope and collapse   . Unspecified sleep apnea    last sleep study 11/07  . Ventricular tachycardia ([facility])     Tobacco Use: History  Smoking Status  . Never Smoker  Smokeless Tobacco  . Never Used    Labs: Recent Review Flowsheet Data    Labs for ITP Cardiac and Pulmonary Rehab Latest Ref Rng & Units 08/22/2015 08/23/2015   Cholestrol 0 - 200 mg/dL - 158   LDLCALC 0 - 99 mg/dL - 82   HDL >40 mg/dL - 50   Trlycerides <150 mg/dL - 130   TCO2 0 - 100 mmol/L 23 -      Capillary Blood Glucose: Lab Results  Component Value Date   GLUCAP 68 08/23/2015   GLUCAP 129 (H) 06/27/2011   GLUCAP 97 06/27/2011   GLUCAP 111 (H) 06/27/2011   GLUCAP 99 06/27/2011     Exercise Target Goals:    Exercise Program Goal: Individual exercise prescription set with THRR, safety & activity barriers. Participant demonstrates ability to understand and report RPE using BORG scale, to self-measure pulse accurately, and to  acknowledge the importance of the exercise prescription.  Exercise Prescription Goal: Starting with aerobic activity 30 plus minutes a day, 3 days per week for initial exercise prescription. Provide home exercise prescription and guidelines that participant acknowledges understanding prior to discharge.  Activity Barriers & Risk Stratification:     Activity Barriers & Cardiac Risk Stratification - 03/31/16 1032      Activity Barriers & Cardiac Risk Stratification   Activity Barriers None      6 Minute Walk:     6 Minute Walk    Row Name 03/09/16 1635         6 Minute Walk   Phase Initial     Distance 1412 feet     Walk Time 6 minutes      # of Rest Breaks 0     MPH 2.67     METS 3.09     RPE 11     VO2 Peak 10.83     Symptoms No     Resting HR 57 bpm     Resting BP 128/70     Max Ex. HR 85 bpm     Max Ex. BP 138/60     2 Minute Post BP 108/70        Initial Exercise Prescription:     Initial Exercise Prescription - 03/09/16 1600      Date of Initial Exercise RX and Referring Provider   Date 03/09/16   Referring Provider [name] MD     Treadmill   MPH 2.3   Grade 1   Minutes 10   METs 3.08     Bike   Level 1   Minutes 10   METs 3.22     NuStep   Level 3   Minutes 10   METs 2.8     Prescription Details   Frequency (times per week) 3   Duration Progress to 30 minutes of continuous aerobic without signs/symptoms of physical distress     Intensity   THRR 40-80% of Max Heartrate 59-118   Ratings of Perceived Exertion 11-13   Perceived Dyspnea 0-4     Progression   Progression Continue to progress workloads to maintain intensity without signs/symptoms of physical distress.     Resistance Training   Training Prescription Yes   Weight 2lbs   Reps 10-12      Perform Capillary Blood Glucose checks as needed.  Exercise Prescription Changes:     Exercise Prescription Changes    Row Name 04/05/16 0700 04/23/16 1400           Exercise Review   Progression Yes  -        Response to Exercise   Blood Pressure (Admit) 100/72 124/68      Blood Pressure (Exercise) 118/70 120/80      Blood Pressure (Exit) 124/70 118/60      Heart Rate (Admit) 62 bpm 75 bpm      Heart Rate (Exercise) 101 bpm 87 bpm      Heart Rate (Exit) 62 bpm 55 bpm      Rating of Perceived Exertion (Exercise) 12 12      Symptoms none none      Comments Home exercise reviewed on 03/17/16. Home exercise reviewed on 03/17/16.      Duration Progress to 30 minutes of continuous aerobic without signs/symptoms of physical distress Progress to 30 minutes of continuous aerobic without signs/symptoms of physical distress       Intensity THRR unchanged THRR  unchanged        Progression   Progression Continue to progress workloads to maintain intensity without signs/symptoms of physical distress. Continue to progress workloads to maintain intensity without signs/symptoms of physical distress.      Average METs 3.6 3.6        Resistance Training   Training Prescription Yes Yes      Weight 4lbs 4lbs        Interval Training   Interval Training No No        Treadmill   MPH 2.8 2.8      Grade 1 1      Minutes 10 10      METs 3.53 3.53        Bike   Level 1.4 1.4      Minutes 10 10      METs 4.07 4.02        NuStep   Level 4 4      Minutes 10 10      METs 3.3 3.2        Home Exercise Plan   Plans to continue exercise at [place] 2 additional days to program exercise sessions. Add 2 additional days to program exercise sessions.         Exercise Comments:     Exercise Comments    Row Name 04/09/16 1415 04/23/16 1457         Exercise Comments Doing well with exercise. Pt walking 30 minutes, 2 days/week in addition to cardiac rehab. Continues to make great progress with exercise.         Discharge Exercise Prescription (Final Exercise Prescription Changes):     Exercise Prescription Changes - 04/23/16 1400      Response to Exercise   Blood Pressure (Admit) 124/68   Blood Pressure (Exercise) 120/80   Blood Pressure (Exit) 118/60   Heart Rate (Admit) 75 bpm   Heart Rate (Exercise) 87 bpm   Heart Rate (Exit) 55 bpm   Rating of Perceived Exertion (Exercise) 12   Symptoms none   Comments Home exercise reviewed on 03/17/16.   Duration Progress to 30 minutes of continuous aerobic without signs/symptoms of physical distress   Intensity THRR unchanged     Progression   Progression Continue to progress workloads to maintain intensity without signs/symptoms of physical distress.   Average METs 3.6     Resistance Training   Training Prescription Yes   Weight 4lbs      Interval Training   Interval Training No     Treadmill   MPH 2.8   Grade 1   Minutes 10   METs 3.53     Bike   Level 1.4   Minutes 10   METs 4.02     NuStep   Level 4   Minutes 10   METs 3.2     Home Exercise Plan   Plans to continue exercise at Home   Frequency Add 2 additional days to program exercise sessions.      Nutrition:  Target Goals: Understanding of nutrition guidelines, daily intake of sodium 1500mg , cholesterol 200mg , calories 30% from fat and 7% or less from saturated fats, daily to have 5 or more servings of fruits and vegetables.  Biometrics:     Pre Biometrics - 03/09/16 1634      Pre Biometrics   Height 6\' 1"  (1.854 m)   Weight 189 lb 9.5 oz (86 kg)   Waist  Circumference 36 inches   Hip Circumference 40.75 inches   Waist to Hip Ratio 0.88 %   BMI (Calculated) 25.1   Triceps Skinfold 12 mm   % Body Fat 23.5 %   Grip Strength 39.5 kg   Flexibility 8 in   Single Leg Stand 1.03 seconds       Nutrition Therapy Plan and Nutrition Goals:     Nutrition Therapy & Goals - 03/10/16 1007      Nutrition Therapy   Diet Therapeutic Lifestyle Changes     Personal Nutrition Goals   Personal Goal #1 Maintain wt while in [place], educate and counsel regarding individualized specific dietary modifications aiming towards targeted core components such as weight, hypertension, lipid management, diabetes, heart failure and other comorbidities.   Expected Outcomes Short Term Goal: Understand basic principles of dietary content, such as calories, fat, sodium, cholesterol and nutrients.;Long Term Goal: Adherence to prescribed nutrition plan.      Nutrition Discharge: Nutrition Scores:     Nutrition Assessments - 04/07/16 1201      MEDFICTS Scores   Pre Score 70      Nutrition Goals Re-Evaluation:   Psychosocial: Target Goals: Acknowledge presence or absence of depression, maximize coping  skills, provide positive support system. Participant is able to verbalize types and ability to use techniques and skills needed for reducing stress and depression.  Initial Review & Psychosocial Screening:     Initial Psych Review & Screening - 03/11/16 [place]? Yes     Barriers   Psychosocial barriers to participate in program There are no identifiable barriers or psychosocial needs.     Screening Interventions   Interventions Encouraged to exercise      Quality of Life Scores:     Quality of Life - 03/09/16 1638      Quality of Life Scores   Health/Function Pre 23.63 %   Socioeconomic Pre 29.64 %   Psych/Spiritual Pre 27.21 %   Family Pre 30 %   GLOBAL Pre 26.54 %      PHQ-9: Recent Review Flowsheet Data    Depression screen [facility] 2/9 03/15/2016   Decreased Interest 0   Down, Depressed, Hopeless 0   PHQ - 2 Score 0      Psychosocial Evaluation and Intervention:   Psychosocial Re-Evaluation:     Psychosocial Re-Evaluation    Row Name 04/08/16 1401 05/06/16 1554           Psychosocial Re-Evaluation   Interventions Encouraged to attend Cardiac Rehabilitation for the exercise Encouraged to attend Cardiac Rehabilitation for the exercise      Continued Psychosocial Services Needed No No         Vocational Rehabilitation: Provide vocational rehab assistance to qualifying candidates.   Vocational Rehab Evaluation & Intervention:     Vocational Rehab - 03/09/16 1535      Initial Vocational Rehab Evaluation & Intervention   Assessment shows need for Vocational Rehabilitation No      Education: Education Goals: Education classes will be provided on a weekly basis, covering required topics. Participant will state understanding/return demonstration of topics presented.  Learning Barriers/Preferences:     Learning Barriers/Preferences - 03/09/16 1347      Learning Barriers/Preferences   Learning Barriers None    Learning Preferences Audio      Education Topics: Count Your Pulse:  -Group instruction provided by  verbal instruction, demonstration, patient participation and written materials to support subject.  Instructors address importance of being able to find your pulse and how to count your pulse when at home without a heart monitor.  Patients get hands on experience counting their pulse with staff help and individually.   Heart Attack, Angina, and Risk Factor Modification:  -Group instruction provided by verbal instruction, video, and written materials to support subject.  Instructors address signs and symptoms of angina and heart attacks.    Also discuss risk factors for heart disease and how to make changes to improve heart health risk factors. Flowsheet Row CARDIAC REHAB PHASE II EXERCISE from 04/14/2016 in [facility]  Date  03/17/16  Instruction Review Code  2- meets goals/outcomes      Functional Fitness:  -Group instruction provided by verbal instruction, demonstration, patient participation, and written materials to support subject.  Instructors address safety measures for doing things around the house.  Discuss how to get up and down off the floor, how to pick things up properly, how to safely get out of a chair without assistance, and balance training.   Meditation and Mindfulness:  -Group instruction provided by verbal instruction, patient participation, and written materials to support subject.  Instructor addresses importance of mindfulness and meditation practice to help reduce stress and improve awareness.  Instructor also leads participants through a meditation exercise.  Flowsheet Row CARDIAC REHAB PHASE II EXERCISE from 04/14/2016 in [place]  Date  03/24/16  Educator  bob hamilton  Instruction Review Code  2- meets goals/outcomes      Stretching for Flexibility and Mobility:  -Group instruction provided by  verbal instruction, patient participation, and written materials to support subject.  Instructors lead participants through series of stretches that are designed to increase flexibility thus improving mobility.  These stretches are additional exercise for major muscle groups that are typically performed during regular warm up and cool down.   Hands Only CPR Anytime:  -Group instruction provided by verbal instruction, video, patient participation and written materials to support subject.  Instructors co-teach with AHA video for hands only CPR.  Participants get hands on experience with mannequins.   Nutrition I class: Heart Healthy Eating:  -Group instruction provided by PowerPoint slides, verbal discussion, and written materials to support subject matter. The instructor gives an explanation and review of the Therapeutic Lifestyle Changes diet recommendations, which includes a discussion on lipid goals, dietary fat, sodium, fiber, plant stanol/sterol esters, sugar, and the components of a well-balanced, healthy diet. Flowsheet Row CARDIAC REHAB PHASE II EXERCISE from 04/14/2016 in [place]  Date  04/07/16  Educator  RD  Instruction Review Code  Not applicable [class handouts given]      Nutrition II class: Lifestyle Skills:  -Group instruction provided by PowerPoint slides, verbal discussion, and written materials to support subject matter. The instructor gives an explanation and review of label reading, grocery shopping for heart health, heart healthy recipe modifications, and ways to make healthier choices when eating out. Flowsheet Row CARDIAC REHAB PHASE II EXERCISE from 04/14/2016 in [place]  Date  04/07/16  Educator  RD  Instruction Review Code  Not applicable [class handouts given]      Diabetes Question & Answer:  -Group instruction provided by PowerPoint slides, verbal discussion, and written materials to support  subject matter. The instructor gives an explanation and review of diabetes co-morbidities, pre- and  post-prandial blood glucose goals, pre-exercise blood glucose goals, signs, symptoms, and treatment of hypoglycemia and hyperglycemia, and foot care basics.   Diabetes Blitz:  -Group instruction provided by PowerPoint slides, verbal discussion, and written materials to support subject matter. The instructor gives an explanation and review of the physiology behind type 1 and type 2 diabetes, diabetes medications and rational behind using different medications, pre- and post-prandial blood glucose recommendations and Hemoglobin A1c goals, diabetes diet, and exercise including blood glucose guidelines for exercising safely.    Portion Distortion:  -Group instruction provided by PowerPoint slides, verbal discussion, written materials, and food models to support subject matter. The instructor gives an explanation of serving size versus portion size, changes in portions sizes over the last 20 years, and what consists of a serving from each food group.   Stress Management:  -Group instruction provided by verbal instruction, video, and written materials to support subject matter.  Instructors review role of stress in heart disease and how to cope with stress positively.     Exercising on Your Own:  -Group instruction provided by verbal instruction, power point, and written materials to support subject.  Instructors discuss benefits of exercise, components of exercise, frequency and intensity of exercise, and end points for exercise.  Also discuss use of nitroglycerin and activating EMS.  Review options of places to exercise outside of rehab.  Review guidelines for sex with heart disease. Flowsheet Row CARDIAC REHAB PHASE II EXERCISE from 04/14/2016 in [place]  Date  04/14/16  Educator  [employer]  Instruction Review Code  2- meets goals/outcomes      Cardiac Drugs  I:  -Group instruction provided by verbal instruction and written materials to support subject.  Instructor reviews cardiac drug classes: antiplatelets, anticoagulants, beta blockers, and statins.  Instructor discusses reasons, side effects, and lifestyle considerations for each drug class.   Cardiac Drugs II:  -Group instruction provided by verbal instruction and written materials to support subject.  Instructor reviews cardiac drug classes: angiotensin converting enzyme inhibitors (ACE-I), angiotensin II receptor blockers (ARBs), nitrates, and calcium channel blockers.  Instructor discusses reasons, side effects, and lifestyle considerations for each drug class.   Anatomy and Physiology of the Circulatory System:  -Group instruction provided by verbal instruction, video, and written materials to support subject.  Reviews functional anatomy of heart, how it relates to various diagnoses, and what role the heart plays in the overall system.   Knowledge Questionnaire Score:     Knowledge Questionnaire Score - 03/09/16 1638      Knowledge Questionnaire Score   Pre Score 23/24      Core Components/Risk Factors/Patient Goals at Admission:     Personal Goals and Risk Factors at Admission - 03/09/16 1535      Core Components/Risk Factors/Patient Goals on Admission    Weight Management Weight Maintenance;Yes   Intervention Weight Management: Develop a combined nutrition and exercise program designed to reach desired caloric intake, while maintaining appropriate intake of nutrient and fiber, sodium and fats, and appropriate energy expenditure required for the weight goal.;Weight Management: Provide education and appropriate resources to help participant work on and attain dietary goals.   Admit Weight 189 lb 9.5 oz (86 kg)   Expected Outcomes Weight Maintenance: Understanding of the daily nutrition guidelines, which includes 25-35% calories from fat, 7% or less cal from saturated fats, less  than 200mg  cholesterol, less than 1.5gm of sodium, & 5 or more servings of fruits and vegetables daily  Personal Goal Other Yes   Personal Goal Improve leg strength. be able to breathe better. Be able to get back to pre-hospitalization activities.   Intervention Provide individualized aerobic and resistance training program to help achieve goal of increased leg strength, and endurance, so that patient is better able to do ADLs and improve cardiorespiratory fitness, so that pt is abel to breathe better.   Expected Outcomes Be able to resume ADLs that he was able to do previously and to have less shortness of breath.      Core Components/Risk Factors/Patient Goals Review:      Goals and Risk Factor Review    Row Name 04/09/16 1417 04/23/16 1457           Core Components/Risk Factors/Patient Goals Review   Personal Goals Review Other Other      Review Patient walking 30 minutes, 2 days/week at home. Pt is feeling stronger and is breathing better since starting the program[place] [facility] [place] [facility] [place] [facility]. Breathing is better.      Expected Outcomes Maintain aerobic exercise 4-5 days/week to achieve fitness goals. Maintain aerobic exercise 4-5 days/week to achieve fitness goals.         Core Components/Risk Factors/Patient Goals at Discharge (Final Review):      Goals and Risk Factor Review - 04/23/16 1457      Core Components/Risk Factors/Patient Goals Review   Personal Goals Review Other   [place] [facility] his [facility] exercise. Breathing is better.   Expected Outcomes Maintain aerobic exercise 4-5 days/week to achieve fitness goals.      ITP Comments:     ITP Comments    Row Name 03/09/16 1338           ITP Comments Medical Director- Dr. Fransico Him, MD          Comments: [name] is making expected progress toward personal goals after completing 23 sessions. Recommend continued exercise and life style modification  education including  stress management and relaxation techniques to decrease cardiac risk profile. Barnet Pall, RN,BSN 05/06/2016 3:56 PM

## 2016-05-07 ENCOUNTER — Ambulatory Visit (HOSPITAL_COMMUNITY): Payer: Medicare Other

## 2016-05-07 ENCOUNTER — Encounter (HOSPITAL_COMMUNITY)
Admission: RE | Admit: 2016-05-07 | Discharge: 2016-05-07 | Disposition: A | Discharge status: Home / Self Care | Payer: Medicare Other | Source: Ambulatory Visit | Attending: Internal Medicine | Admitting: Internal Medicine

## 2016-05-07 DIAGNOSIS — I5022 Chronic systolic (congestive) heart failure: Secondary | ICD-10-CM

## 2016-05-10 ENCOUNTER — Ambulatory Visit (HOSPITAL_COMMUNITY): Payer: Medicare Other

## 2016-05-10 ENCOUNTER — Encounter (HOSPITAL_COMMUNITY)
Admission: RE | Admit: 2016-05-10 | Discharge: 2016-05-10 | Disposition: A | Discharge status: Home / Self Care | Payer: Medicare Other | Source: Ambulatory Visit | Attending: Internal Medicine | Admitting: Internal Medicine

## 2016-05-10 DIAGNOSIS — I5022 Chronic systolic (congestive) heart failure: Secondary | ICD-10-CM | POA: Diagnosis not present

## 2016-05-12 ENCOUNTER — Ambulatory Visit (HOSPITAL_COMMUNITY): Payer: Medicare Other

## 2016-05-12 ENCOUNTER — Encounter (HOSPITAL_COMMUNITY)
Admission: RE | Admit: 2016-05-12 | Discharge: 2016-05-12 | Disposition: A | Discharge status: Home / Self Care | Payer: Medicare Other | Source: Ambulatory Visit | Attending: Internal Medicine | Admitting: Internal Medicine

## 2016-05-12 DIAGNOSIS — I5022 Chronic systolic (congestive) heart failure: Secondary | ICD-10-CM

## 2016-05-14 ENCOUNTER — Ambulatory Visit (HOSPITAL_COMMUNITY): Payer: Medicare Other

## 2016-05-14 ENCOUNTER — Encounter (HOSPITAL_COMMUNITY)
Admission: RE | Admit: 2016-05-14 | Discharge: 2016-05-14 | Disposition: A | Discharge status: Home / Self Care | Payer: Medicare Other | Source: Ambulatory Visit | Attending: Internal Medicine | Admitting: Internal Medicine

## 2016-05-14 ENCOUNTER — Ambulatory Visit (INDEPENDENT_AMBULATORY_CARE_PROVIDER_SITE_OTHER): Payer: Medicare Other | Admitting: Cardiovascular Disease

## 2016-05-14 DIAGNOSIS — Z5181 Encounter for therapeutic drug level monitoring: Secondary | ICD-10-CM

## 2016-05-14 DIAGNOSIS — I5022 Chronic systolic (congestive) heart failure: Secondary | ICD-10-CM | POA: Diagnosis not present

## 2016-05-14 DIAGNOSIS — I059 Rheumatic mitral valve disease, unspecified: Secondary | ICD-10-CM

## 2016-05-14 LAB — POCT INR: INR: 3.4

## 2016-05-17 ENCOUNTER — Ambulatory Visit (HOSPITAL_COMMUNITY): Payer: Medicare Other

## 2016-05-17 ENCOUNTER — Encounter (HOSPITAL_COMMUNITY)
Admission: RE | Admit: 2016-05-17 | Discharge: 2016-05-17 | Disposition: A | Discharge status: Home / Self Care | Payer: Medicare Other | Source: Ambulatory Visit | Attending: Internal Medicine | Admitting: Internal Medicine

## 2016-05-17 DIAGNOSIS — I5022 Chronic systolic (congestive) heart failure: Secondary | ICD-10-CM | POA: Diagnosis not present

## 2016-05-18 ENCOUNTER — Encounter: Payer: Self-pay | Admitting: Internal Medicine

## 2016-05-18 ENCOUNTER — Ambulatory Visit (INDEPENDENT_AMBULATORY_CARE_PROVIDER_SITE_OTHER): Payer: Medicare Other | Admitting: Internal Medicine

## 2016-05-18 VITALS — BP 120/64 | HR 59 | Ht 73.5 in | Wt 192.8 lb

## 2016-05-18 DIAGNOSIS — M6281 Muscle weakness (generalized): Secondary | ICD-10-CM

## 2016-05-18 DIAGNOSIS — I5022 Chronic systolic (congestive) heart failure: Secondary | ICD-10-CM | POA: Diagnosis not present

## 2016-05-18 DIAGNOSIS — I472 Ventricular tachycardia, unspecified: Secondary | ICD-10-CM

## 2016-05-18 DIAGNOSIS — Z95810 Presence of automatic (implantable) cardiac defibrillator: Secondary | ICD-10-CM | POA: Diagnosis not present

## 2016-05-18 DIAGNOSIS — Z79899 Other long term (current) drug therapy: Secondary | ICD-10-CM | POA: Diagnosis not present

## 2016-05-18 LAB — CUP PACEART INCLINIC DEVICE CHECK
Brady Statistic AP VS Percent: 0.22 %
Brady Statistic AS VP Percent: 18.02 %
Brady Statistic RV Percent Paced: 19.37 %
Date Time Interrogation Session: 20171024113436
HIGH POWER IMPEDANCE MEASURED VALUE: 342 Ohm
HIGH POWER IMPEDANCE MEASURED VALUE: 68 Ohm
Implantable Lead Implant Date: 20130110
Implantable Lead Implant Date: 20130110
Implantable Lead Location: 753858
Implantable Lead Location: 753860
Lead Channel Impedance Value: 399 Ohm
Lead Channel Impedance Value: 475 Ohm
Lead Channel Impedance Value: 836 Ohm
Lead Channel Pacing Threshold Amplitude: 0.625 V
Lead Channel Pacing Threshold Amplitude: 3.5 V
Lead Channel Pacing Threshold Pulse Width: 1.2 ms
Lead Channel Sensing Intrinsic Amplitude: 0.75 mV
Lead Channel Sensing Intrinsic Amplitude: 5.125 mV
Lead Channel Setting Sensing Sensitivity: 0.3 mV
MDC IDC LEAD IMPLANT DT: 20130110
MDC IDC LEAD LOCATION: 753859
MDC IDC LEAD MODEL: 4196
MDC IDC LEAD MODEL: 6935
MDC IDC MSMT BATTERY VOLTAGE: 2.76 V
MDC IDC MSMT LEADCHNL LV IMPEDANCE VALUE: 532 Ohm
MDC IDC MSMT LEADCHNL RA IMPEDANCE VALUE: 418 Ohm
MDC IDC MSMT LEADCHNL RA PACING THRESHOLD PULSEWIDTH: 0.4 ms
MDC IDC MSMT LEADCHNL RA SENSING INTR AMPL: 1.625 mV
MDC IDC MSMT LEADCHNL RV PACING THRESHOLD AMPLITUDE: 0.875 V
MDC IDC MSMT LEADCHNL RV PACING THRESHOLD PULSEWIDTH: 0.4 ms
MDC IDC MSMT LEADCHNL RV SENSING INTR AMPL: 3.75 mV
MDC IDC SET LEADCHNL RA PACING AMPLITUDE: 2 V
MDC IDC SET LEADCHNL RV PACING AMPLITUDE: 2.5 V
MDC IDC SET LEADCHNL RV PACING PULSEWIDTH: 0.4 ms
MDC IDC STAT BRADY AP VP PERCENT: 1.35 %
MDC IDC STAT BRADY AS VS PERCENT: 80.41 %
MDC IDC STAT BRADY RA PERCENT PACED: 1.57 %

## 2016-05-18 LAB — CBC WITH DIFFERENTIAL/PLATELET
BASOS ABS: 80 {cells}/uL (ref 0–200)
Basophils Relative: 1 %
Eosinophils Absolute: 80 cells/uL (ref 15–500)
Eosinophils Relative: 1 %
HEMATOCRIT: 45.8 % (ref 38.5–50.0)
HEMOGLOBIN: 15.1 g/dL (ref 13.2–17.1)
LYMPHS ABS: 1040 {cells}/uL (ref 850–3900)
LYMPHS PCT: 13 %
MCH: 30.9 pg (ref 27.0–33.0)
MCHC: 33 g/dL (ref 32.0–36.0)
MCV: 93.7 fL (ref 80.0–100.0)
MONO ABS: 1040 {cells}/uL — AB (ref 200–950)
MPV: 10.4 fL (ref 7.5–12.5)
Monocytes Relative: 13 %
NEUTROS PCT: 72 %
Neutro Abs: 5760 cells/uL (ref 1500–7800)
Platelets: 195 10*3/uL (ref 140–400)
RBC: 4.89 MIL/uL (ref 4.20–5.80)
RDW: 14.2 % (ref 11.0–15.0)
WBC: 8 10*3/uL (ref 3.8–10.8)

## 2016-05-18 LAB — T4, FREE: FREE T4: 1.5 ng/dL (ref 0.8–1.8)

## 2016-05-18 LAB — TSH: TSH: 3.82 mIU/L (ref 0.40–4.50)

## 2016-05-18 NOTE — Patient Instructions (Addendum)
Medication Instructions:  Your physician recommends that you continue on your current medications as directed. Please refer to the Current Medication list given to you today.   Labwork: TODAY: TSH / Free T4 / CBC w/ Diff   Testing/Procedures: None Ordered   Follow-Up: Your physician recommends that you schedule a follow-up appointment in: 4 months with Dr. Lovena Le   Remote monitoring is used to monitor your ICD from home. This monitoring reduces the number of office visits required to check your device to one time per year. It allows [place] to keep an eye on the functioning of your device to ensure it is working properly. You are scheduled for a device check from home on 08/17/16. You may send your transmission at any time that day. If you have a wireless device, the transmission will be sent automatically. After your physician reviews your transmission, you will receive a postcard with your next transmission date.    Any Other Special Instructions Will Be Listed Below (If Applicable).     If you need a refill on your cardiac medications before your next appointment, please call your pharmacy.

## 2016-05-18 NOTE — Progress Notes (Signed)
HPI [name] returns today for followup. He is a pleasant 75 yo man with a non-ischemic CM, chronic systolic heart failure, atrial fib, and VT. He underwent repeat catheter ablation several weeks ago. He has continued on amiodarone but his mexilitine was stopped. He has had no recurrent VT. He has had some lightheaded spells but no palpitations. He admits to still driving. He is interested in reducing his amiodarone. He c/o fatigue in the afternoon if he has done something strenuous that day.    No Known Allergies   Current Outpatient Prescriptions  Medication Sig Dispense Refill  . acetaminophen (TYLENOL) 500 MG tablet Take 1,000 mg by mouth every 6 (six) hours as needed (pain).    Marland Kitchen amiodarone (PACERONE) 200 MG tablet Take 1 tablet (200 mg total) by mouth daily.    . folic acid (FOLVITE) 1 MG tablet Take 1 tablet (1 mg total) by mouth at bedtime. 90 tablet 3  . furosemide (LASIX) 20 MG tablet Take 20 mg by mouth daily as needed. For weight over 205    . levothyroxine (SYNTHROID, LEVOTHROID) 75 MCG tablet Take 75 mcg by mouth daily before breakfast.    . lisinopril (PRINIVIL,ZESTRIL) 10 MG tablet Take 10 mg by mouth at bedtime.   9  . metoprolol tartrate (LOPRESSOR) 25 MG tablet Take 0.5 tablets (12.5 mg total) by mouth 2 (two) times daily. 180 tablet 3  . Multiple Vitamin (MULTIVITAMIN WITH MINERALS) TABS tablet Take 1 tablet by mouth daily.    . Omega-3 Fatty Acids (FISH OIL) 1000 MG CAPS Take 1,000 mg by mouth at bedtime.     Marland Kitchen warfarin (COUMADIN) 5 MG tablet TAKE AS DIRECTED BY COUMADIN CLINIC (Patient taking differently: 5 mg MWF, 2.5 mg AOD) 90 tablet 1   No current facility-administered medications for this visit.      Past Medical History:  Diagnosis Date  . Atrial fibrillation ([place])   . 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 Conductive hearing loss   . Intraspinal abscess   . Mitral valve  insufficiency and aortic valve insufficiency    s/p MVR mechanical  . Nonischemic cardiomyopathy ([place])   . Psychosexual dysfunction with inhibited sexual excitement   . S/P mitral valve replacement   . Syncope and collapse   . Unspecified sleep apnea    last sleep study 11/07  . Ventricular tachycardia ([place])     ROS:   All systems reviewed and negative except as noted in the HPI.   Past Surgical History:  Procedure Laterality Date  . CARDIAC CATHETERIZATION  04/24/2002  . CARDIAC VALVE REPLACEMENT    . CARDIOVERSION  03/09/2012   Procedure: CARDIOVERSION;  Surgeon: Evans Lance, MD;  Location: [facility];  Service: Cardiovascular;  Laterality: N/A;  . CARDIOVERSION N/A 11/22/2013   Procedure: CARDIOVERSION;  Surgeon: Sanda Klein, MD;  Location: [facility];  Service: Cardiovascular;  Laterality: N/A;  . dental implants    . ELECTROPHYSIOLOGIC STUDY N/A 09/15/2015   Procedure: V Tach Ablation;  Surgeon: Evans Lance, MD;  Location: [facility];  Service: Cardiovascular;  Laterality: N/A;  . Evacution of epidural lumbar epidural abscess  1999  . INSERT / REPLACE / REMOVE PACEMAKER  11/2008  . MITRAL VALVE REPLACEMENT     w #[id number] [facility]  . PERMANENT PACEMAKER INSERTION N/A 08/05/2011   Procedure: PERMANENT PACEMAKER INSERTION;  Surgeon: Evans Lance, MD;  Location: [facility] CATH  LAB;  Service: Cardiovascular;  Laterality: N/A;  . THYROIDECTOMY    . TONSILLECTOMY    . VALVE REPLACEMENT  2000     Family History  Problem Relation Age of Onset  . Heart disease Mother   . Heart failure Mother   . Heart disease Father   . Heart failure Father      Social History   Social History  . Marital status: Married    Spouse name: N/A  . Number of children: N/A  . Years of education: N/A   Occupational History  . Not on file.   Social History Main Topics  . Smoking status: Never Smoker  . Smokeless tobacco: Never Used  . Alcohol use 0.0 oz/week     Comment:  occasionally  . Drug use: No  . Sexual activity: Not on file   Other Topics Concern  . Not on file   Social History Narrative  . No narrative on file     BP 120/64   Pulse (!) 59   Ht 6' 1.5" (1.867 m)   Wt 192 lb 12.8 oz (87.5 kg)   BMI 25.09 kg/m   Physical Exam:  Well appearing 75 year old man, NAD HEENT: Unremarkable Neck:  7 cm JVD, no thyromegally Back:  No CVA tenderness Lungs:  Clear with no wheezes, rales, or rhonchi.  Well-healed ICD incision HEART:  Regular rate rhythm, soft systolic murmur, no rubs, no clicks, mechanical S1. Abd:  soft, positive bowel sounds, no organomegally, no rebound, no guarding Ext:  2 plus pulses, trace peripheral edema, no cyanosis, no clubbing Skin:  No rashes no nodules Neuro:  CN II through XII intact, motor grossly intact  DEVICE  Normal device function.  See PaceArt for details.   Assess/Plan: 1. VT - he has not had recurrent VT. Will continue amio 200 daily. I will reduce if he has had no VT when I see him back.  2. Chronic systolic heart failure - his symptoms are stable. Hopefully we can keep his symptoms class 2. 3. BiV ICD - his device is stable. He is programmed to allow him to conduct on his own. 4. Atrial fib - he will continue his current meds. His rate is controlled. 5. Anti-coag - with his mechanical mitral valve, will continue coumadin  Mikle Bosworth.D.

## 2016-05-19 ENCOUNTER — Encounter (HOSPITAL_COMMUNITY)
Admission: RE | Admit: 2016-05-19 | Discharge: 2016-05-19 | Disposition: A | Discharge status: Home / Self Care | Payer: Medicare Other | Source: Ambulatory Visit | Attending: Internal Medicine | Admitting: Internal Medicine

## 2016-05-19 ENCOUNTER — Ambulatory Visit (HOSPITAL_COMMUNITY): Payer: Medicare Other

## 2016-05-19 DIAGNOSIS — I5022 Chronic systolic (congestive) heart failure: Secondary | ICD-10-CM | POA: Diagnosis not present

## 2016-05-21 ENCOUNTER — Ambulatory Visit (HOSPITAL_COMMUNITY): Payer: Medicare Other

## 2016-05-21 ENCOUNTER — Encounter (HOSPITAL_COMMUNITY)
Admission: RE | Admit: 2016-05-21 | Discharge: 2016-05-21 | Disposition: A | Discharge status: Home / Self Care | Payer: Medicare Other | Source: Ambulatory Visit | Attending: Internal Medicine | Admitting: Internal Medicine

## 2016-05-21 DIAGNOSIS — I5022 Chronic systolic (congestive) heart failure: Secondary | ICD-10-CM | POA: Diagnosis not present

## 2016-05-24 ENCOUNTER — Ambulatory Visit (HOSPITAL_COMMUNITY): Payer: Medicare Other

## 2016-05-24 ENCOUNTER — Encounter (HOSPITAL_COMMUNITY): Admission: RE | Admit: 2016-05-24 | Payer: Medicare Other | Source: Ambulatory Visit

## 2016-05-26 ENCOUNTER — Encounter (HOSPITAL_COMMUNITY): Payer: Medicare Other

## 2016-05-26 ENCOUNTER — Ambulatory Visit (HOSPITAL_COMMUNITY): Payer: Medicare Other

## 2016-05-27 ENCOUNTER — Ambulatory Visit (INDEPENDENT_AMBULATORY_CARE_PROVIDER_SITE_OTHER): Payer: Medicare Other | Admitting: Cardiology

## 2016-05-27 DIAGNOSIS — Z5181 Encounter for therapeutic drug level monitoring: Secondary | ICD-10-CM

## 2016-05-27 DIAGNOSIS — I059 Rheumatic mitral valve disease, unspecified: Secondary | ICD-10-CM

## 2016-05-27 LAB — POCT INR: INR: 2.6

## 2016-05-28 ENCOUNTER — Ambulatory Visit (HOSPITAL_COMMUNITY): Payer: Medicare Other

## 2016-05-28 ENCOUNTER — Encounter (HOSPITAL_COMMUNITY)
Admission: RE | Admit: 2016-05-28 | Discharge: 2016-05-28 | Disposition: A | Discharge status: Home / Self Care | Payer: Medicare Other | Source: Ambulatory Visit | Attending: Internal Medicine | Admitting: Internal Medicine

## 2016-05-28 DIAGNOSIS — Z9889 Other specified postprocedural states: Secondary | ICD-10-CM | POA: Insufficient documentation

## 2016-05-28 DIAGNOSIS — I5022 Chronic systolic (congestive) heart failure: Secondary | ICD-10-CM | POA: Insufficient documentation

## 2016-05-31 ENCOUNTER — Telehealth: Payer: Self-pay

## 2016-05-31 ENCOUNTER — Encounter (HOSPITAL_COMMUNITY)
Admission: RE | Admit: 2016-05-31 | Discharge: 2016-05-31 | Disposition: A | Discharge status: Home / Self Care | Payer: Medicare Other | Source: Ambulatory Visit | Attending: Internal Medicine | Admitting: Internal Medicine

## 2016-05-31 ENCOUNTER — Ambulatory Visit (HOSPITAL_COMMUNITY): Payer: Medicare Other

## 2016-05-31 DIAGNOSIS — I5022 Chronic systolic (congestive) heart failure: Secondary | ICD-10-CM | POA: Diagnosis not present

## 2016-05-31 NOTE — Telephone Encounter (Signed)
Sent medical consult request form to medical records to be faxed to [facility]  Dr. Lovena Le recommends pt take Amoxil 2000 mg 3 hours before dental work.

## 2016-06-01 NOTE — Progress Notes (Signed)
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  acetaminophen (TYLENOL) 500 MG tablet, Take 1,000 mg by mouth every 6 (six) hours as needed (pain)., Disp: , Rfl:  .  amiodarone (PACERONE) 200 MG tablet, Take 1 tablet (200 mg total) by mouth daily., Disp: , Rfl:  .  folic acid (FOLVITE) 1 MG tablet, Take 1 tablet (1 mg total) by mouth at bedtime., Disp: 90 tablet, Rfl: 3 .  furosemide (LASIX) 20 MG tablet, Take 20 mg by mouth daily as needed. For weight over 205, Disp: , Rfl:  .  levothyroxine (SYNTHROID, LEVOTHROID) 75 MCG tablet, Take 75 mcg by mouth daily before breakfast., Disp: , Rfl:  .  lisinopril (PRINIVIL,ZESTRIL) 10 MG tablet, Take 10 mg by mouth at bedtime. , Disp: , Rfl: 9 .  metoprolol tartrate (LOPRESSOR) 25 MG tablet, Take 0.5 tablets (12.5 mg total) by mouth 2 (two) times daily., Disp: 180 tablet, Rfl: 3 .  Multiple Vitamin (MULTIVITAMIN WITH MINERALS) TABS tablet, Take 1 tablet by mouth daily., Disp: , Rfl:  .  Omega-3 Fatty Acids (FISH OIL) 1000 MG CAPS, Take 1,000 mg by mouth at bedtime. , Disp: , Rfl:  .  warfarin (COUMADIN) 5 MG tablet, TAKE AS DIRECTED BY COUMADIN CLINIC (Patient taking differently: 5 mg MWF, 2.5 mg AOD), Disp: 90 tablet, Rfl: 1  Past Medical History: Past Medical History:  Diagnosis Date  . Atrial fibrillation ([place])   . 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 Conductive hearing loss   . Intraspinal abscess   . 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 Nonischemic cardiomyopathy ([place])   . Psychosexual dysfunction with inhibited sexual excitement   . S/P mitral valve replacement   . Syncope and collapse   . Unspecified sleep apnea    last sleep study 11/07  . Ventricular tachycardia ([facility])     Tobacco Use: History  Smoking Status  . Never Smoker  Smokeless Tobacco  . Never Used    Labs: Recent Review Flowsheet Data    Labs for ITP Cardiac and Pulmonary Rehab Latest Ref Rng & Units 08/22/2015 08/23/2015   Cholestrol 0 - 200 mg/dL - 158   LDLCALC 0 - 99 mg/dL - 82   HDL >40 mg/dL - 50   Trlycerides <150 mg/dL - 130   TCO2 0 - 100 mmol/L 23 -      Capillary Blood Glucose: Lab Results  Component Value Date   GLUCAP 68 08/23/2015   GLUCAP 129 (H) 06/27/2011   GLUCAP 97 06/27/2011   GLUCAP 111 (H) 06/27/2011   GLUCAP 99 06/27/2011     Exercise Target Goals:    Exercise Program Goal: Individual exercise prescription set with THRR, safety & activity barriers. Participant demonstrates ability to understand and report RPE using BORG scale, to self-measure pulse accurately, and to  acknowledge the importance of the exercise prescription.  Exercise Prescription Goal: Starting with aerobic activity 30 plus minutes a day, 3 days per week for initial exercise prescription. Provide home exercise prescription and guidelines that participant acknowledges understanding prior to discharge.  Activity Barriers & Risk Stratification:     Activity Barriers & Cardiac Risk Stratification - 03/31/16 1032      Activity Barriers & Cardiac Risk Stratification   Activity Barriers None      6 Minute Walk:     6 Minute Walk    Row Name 03/09/16 1635 05/28/16 1150       6 Minute Walk   Phase Initial Discharge    Distance 1412 feet 1504  feet    Distance % Change  - 6.5 %    Walk Time 6 minutes 6 minutes    # of Rest Breaks 0 0    MPH 2.67 2.8    METS 3.09 3.3    RPE 11 12    VO2 Peak 10.83 11.39    Symptoms No No    Resting HR 57 bpm 60 bpm    Resting BP 128/70 104/70    Max Ex. HR 85 bpm 88 bpm    Max Ex. BP 138/60 138/72    2 Minute Post BP 108/70 102/66       Initial Exercise Prescription:     Initial Exercise Prescription - 03/09/16 1600      Date of Initial Exercise RX and Referring Provider   Date 03/09/16   Referring Provider [name] MD     Treadmill   MPH 2.3   Grade 1   Minutes 10   METs 3.08     Bike   Level 1   Minutes 10   METs 3.22     NuStep   Level 3   Minutes 10   METs 2.8     Prescription Details   Frequency (times per week) 3   Duration Progress to 30 minutes of continuous aerobic without signs/symptoms of physical distress     Intensity   THRR 40-80% of Max Heartrate 59-118   Ratings of Perceived Exertion 11-13   Perceived Dyspnea 0-4     Progression   Progression Continue to progress workloads to maintain intensity without signs/symptoms of physical distress.     Resistance Training   Training Prescription Yes   Weight 2lbs   Reps 10-12      Perform Capillary Blood Glucose checks as needed.  Exercise Prescription Changes:      Exercise Prescription Changes    Row Name 04/05/16 0700 04/23/16 1400 05/13/16 1000 05/28/16 1100       Exercise Review   Progression Yes  -  -  -      Response to Exercise   Blood Pressure (Admit) 100/72 124/68 110/64 104/70    Blood Pressure (Exercise) 118/70 120/80 116/72 138/72    Blood Pressure (Exit) 124/70 118/60 104/70 102/60    Heart Rate (Admit) 62 bpm 75 bpm 67 bpm 60 bpm    Heart Rate (Exercise) 101 bpm 87 bpm 96 bpm 88 bpm    Heart Rate (Exit) 62 bpm 55 bpm 67 bpm 58 bpm    Rating of Perceived Exertion (Exercise) 12 12 12 12     Symptoms none none none none    Comments Home exercise reviewed on 03/17/16.  Home exercise reviewed on 03/17/16. Home exercise reviewed on 03/17/16. Home exercise reviewed on 03/17/16.    Duration Progress to  30 minutes of continuous aerobic without signs/symptoms of physical distress Progress to 30 minutes of continuous aerobic without signs/symptoms of physical distress Progress to 30 minutes of continuous aerobic without signs/symptoms of physical distress Progress to 30 minutes of continuous aerobic without signs/symptoms of physical distress    Intensity THRR unchanged THRR unchanged THRR unchanged THRR unchanged      Progression   Progression Continue to progress workloads to maintain intensity without signs/symptoms of physical distress. Continue to progress workloads to maintain intensity without signs/symptoms of physical distress. Continue to progress workloads to maintain intensity without signs/symptoms of physical distress. Continue to progress workloads to maintain intensity without signs/symptoms of physical distress.    Average METs 3.6 3.6 3.7 3.7      Resistance Training   Training Prescription Yes Yes Yes Yes    Weight 4lbs 4lbs 4lbs 4lbs      Interval Training   Interval Training No No No No      Treadmill   MPH 2.8 2.8 3 3     Grade 1 1 1 1     Minutes 10 10 10 10     METs 3.53 3.53 3.71 3.71      Bike   Level 1.4 1.4 1.4 1.4    Minutes 10 10 10 10     METs 4.07 4.02 4.02 4.02      NuStep   Level 4 4 4 4     Minutes 10 10 10 10     METs 3.3 3.2 3.3 3.3      Home Exercise Plan   Plans to continue exercise at [facility]    Frequency Add 2 additional days to program exercise sessions. Add 2 additional days to program exercise sessions. Add 2 additional days to program exercise sessions. Add 2 additional days to program exercise sessions.       Exercise Comments:      Exercise Comments    Row Name 04/09/16 1415 04/23/16 1457 05/13/16 1057 05/28/16 1152     Exercise Comments Doing well with exercise. Pt walking 30 minutes, 2  days/week in addition to cardiac rehab. Continues to make great progress with exercise. Continues to make great progress with exercise. Continues to make great progress with exercise.       Discharge Exercise Prescription (Final Exercise Prescription Changes):     Exercise Prescription Changes - 05/28/16 1100      Response to Exercise   Blood Pressure (Admit) 104/70   Blood Pressure (Exercise) 138/72   Blood Pressure (Exit) 102/60   Heart Rate (Admit) 60 bpm   Heart Rate (Exercise) 88 bpm   Heart Rate (Exit) 58 bpm   Rating of Perceived Exertion (Exercise) 12   Symptoms none   Comments Home exercise reviewed on 03/17/16.   Duration Progress to 30 minutes of continuous aerobic without signs/symptoms of physical distress   Intensity THRR unchanged     Progression   Progression Continue to progress workloads to maintain intensity without signs/symptoms of physical distress.   Average METs 3.7     Resistance Training   Training Prescription Yes   Weight 4lbs     Interval Training   Interval Training No     Treadmill   MPH 3   Grade 1   Minutes 10   METs 3.71     Bike   Level 1.4   Minutes 10   METs 4.02     NuStep   Level 4   Minutes 10   METs 3.3  Home Exercise Plan   Plans to continue exercise at Home   Frequency Add 2 additional days to program exercise sessions.      Nutrition:  Target Goals: Understanding of nutrition guidelines, daily intake of sodium 1500mg , cholesterol 200mg , calories 30% from fat and 7% or less from saturated fats, daily to have 5 or more servings of fruits and vegetables.  Biometrics:     Pre Biometrics - 03/09/16 1634      Pre Biometrics   Height 6\' 1"  (1.854 m)   Weight 189 lb 9.5 oz (86 kg)   Waist Circumference 36 inches   Hip Circumference 40.75 inches   Waist to Hip Ratio 0.88 %   BMI (Calculated) 25.1   Triceps Skinfold 12 mm   % Body Fat 23.5 %   Grip Strength 39.5 kg   Flexibility 8 in   Single Leg Stand  1.03 seconds       Nutrition Therapy Plan and Nutrition Goals:     Nutrition Therapy & Goals - 03/10/16 1007      Nutrition Therapy   Diet Therapeutic Lifestyle Changes     Personal Nutrition Goals   Personal Goal #1 Maintain wt while in [place], educate and counsel regarding individualized specific dietary modifications aiming towards targeted core components such as weight, hypertension, lipid management, diabetes, heart failure and other comorbidities.   Expected Outcomes Short Term Goal: Understand basic principles of dietary content, such as calories, fat, sodium, cholesterol and nutrients.;Long Term Goal: Adherence to prescribed nutrition plan.      Nutrition Discharge: Nutrition Scores:     Nutrition Assessments - 04/07/16 1201      MEDFICTS Scores   Pre Score 70      Nutrition Goals Re-Evaluation:   Psychosocial: Target Goals: Acknowledge presence or absence of depression, maximize coping skills, provide positive support system. Participant is able to verbalize types and ability to use techniques and skills needed for reducing stress and depression.  Initial Review & Psychosocial Screening:     Initial Psych Review & Screening - 03/11/16 [place]? Yes     Barriers   Psychosocial barriers to participate in program There are no identifiable barriers or psychosocial needs.     Screening Interventions   Interventions Encouraged to exercise      Quality of Life Scores:     Quality of Life - 03/09/16 1638      Quality of Life Scores   Health/Function Pre 23.63 %   Socioeconomic Pre 29.64 %   Psych/Spiritual Pre 27.21 %   Family Pre 30 %   GLOBAL Pre 26.54 %      PHQ-9: Recent Review Flowsheet Data    Depression screen [facility] 2/9 03/15/2016   Decreased Interest 0   Down, Depressed, Hopeless 0   PHQ - 2 Score 0      Psychosocial Evaluation and  Intervention:   Psychosocial Re-Evaluation:     Psychosocial Re-Evaluation    Row Name 04/08/16 1401 05/06/16 1554 06/01/16 1652         Psychosocial Re-Evaluation   Interventions Encouraged to attend Cardiac Rehabilitation for the exercise Encouraged to attend Cardiac Rehabilitation for the exercise Encouraged to attend Cardiac Rehabilitation for the exercise     Comments  -  - Threre are no needs identifed at this time.     Continued Psychosocial Services Needed No No  No        Vocational Rehabilitation: Provide vocational rehab assistance to qualifying candidates.   Vocational Rehab Evaluation & Intervention:     Vocational Rehab - 03/09/16 1535      Initial Vocational Rehab Evaluation & Intervention   Assessment shows need for Vocational Rehabilitation No      Education: Education Goals: Education classes will be provided on a weekly basis, covering required topics. Participant will state understanding/return demonstration of topics presented.  Learning Barriers/Preferences:     Learning Barriers/Preferences - 03/09/16 1347      Learning Barriers/Preferences   Learning Barriers None   Learning Preferences Audio      Education Topics: Count Your Pulse:  -Group instruction provided by verbal instruction, demonstration, patient participation and written materials to support subject.  Instructors address importance of being able to find your pulse and how to count your pulse when at home without a heart monitor.  Patients get hands on experience counting their pulse with staff help and individually.   Heart Attack, Angina, and Risk Factor Modification:  -Group instruction provided by verbal instruction, video, and written materials to support subject.  Instructors address signs and symptoms of angina and heart attacks.    Also discuss risk factors for heart disease and how to make changes to improve heart health risk factors. Flowsheet Row CARDIAC REHAB PHASE II  EXERCISE from 04/14/2016 in [facility]  Date  03/17/16  Instruction Review Code  2- meets goals/outcomes      Functional Fitness:  -Group instruction provided by verbal instruction, demonstration, patient participation, and written materials to support subject.  Instructors address safety measures for doing things around the house.  Discuss how to get up and down off the floor, how to pick things up properly, how to safely get out of a chair without assistance, and balance training.   Meditation and Mindfulness:  -Group instruction provided by verbal instruction, patient participation, and written materials to support subject.  Instructor addresses importance of mindfulness and meditation practice to help reduce stress and improve awareness.  Instructor also leads participants through a meditation exercise.  Flowsheet Row CARDIAC REHAB PHASE II EXERCISE from 04/14/2016 in [facility]  Date  03/24/16  Educator  bob hamilton  Instruction Review Code  2- meets goals/outcomes      Stretching for Flexibility and Mobility:  -Group instruction provided by verbal instruction, patient participation, and written materials to support subject.  Instructors lead participants through series of stretches that are designed to increase flexibility thus improving mobility.  These stretches are additional exercise for major muscle groups that are typically performed during regular warm up and cool down.   Hands Only CPR Anytime:  -Group instruction provided by verbal instruction, video, patient participation and written materials to support subject.  Instructors co-teach with AHA video for hands only CPR.  Participants get hands on experience with mannequins.   Nutrition I class: Heart Healthy Eating:  -Group instruction provided by PowerPoint slides, verbal discussion, and written materials to support subject matter. The instructor gives an  explanation and review of the Therapeutic Lifestyle Changes diet recommendations, which includes a discussion on lipid goals, dietary fat, sodium, fiber, plant stanol/sterol esters, sugar, and the components of a well-balanced, healthy diet. Flowsheet Row CARDIAC REHAB PHASE II EXERCISE from 04/14/2016 in [facility]  Date  04/07/16  Educator  RD  Instruction Review Code  Not applicable [class handouts given]  Nutrition II class: Lifestyle Skills:  -Group instruction provided by PowerPoint slides, verbal discussion, and written materials to support subject matter. The instructor gives an explanation and review of label reading, grocery shopping for heart health, heart healthy recipe modifications, and ways to make healthier choices when eating out. Flowsheet Row CARDIAC REHAB PHASE II EXERCISE from 04/14/2016 in [facility]  Date  04/07/16  Educator  RD  Instruction Review Code  Not applicable [class handouts given]      Diabetes Question & Answer:  -Group instruction provided by PowerPoint slides, verbal discussion, and written materials to support subject matter. The instructor gives an explanation and review of diabetes co-morbidities, pre- and post-prandial blood glucose goals, pre-exercise blood glucose goals, signs, symptoms, and treatment of hypoglycemia and hyperglycemia, and foot care basics.   Diabetes Blitz:  -Group instruction provided by PowerPoint slides, verbal discussion, and written materials to support subject matter. The instructor gives an explanation and review of the physiology behind type 1 and type 2 diabetes, diabetes medications and rational behind using different medications, pre- and post-prandial blood glucose recommendations and Hemoglobin A1c goals, diabetes diet, and exercise including blood glucose guidelines for exercising safely.    Portion Distortion:  -Group instruction provided by  PowerPoint slides, verbal discussion, written materials, and food models to support subject matter. The instructor gives an explanation of serving size versus portion size, changes in portions sizes over the last 20 years, and what consists of a serving from each food group.   Stress Management:  -Group instruction provided by verbal instruction, video, and written materials to support subject matter.  Instructors review role of stress in heart disease and how to cope with stress positively.     Exercising on Your Own:  -Group instruction provided by verbal instruction, power point, and written materials to support subject.  Instructors discuss benefits of exercise, components of exercise, frequency and intensity of exercise, and end points for exercise.  Also discuss use of nitroglycerin and activating EMS.  Review options of places to exercise outside of rehab.  Review guidelines for sex with heart disease. Flowsheet Row CARDIAC REHAB PHASE II EXERCISE from 04/14/2016 in [facility]  Date  04/14/16  Educator  [employer]  Instruction Review Code  2- meets goals/outcomes      Cardiac Drugs I:  -Group instruction provided by verbal instruction and written materials to support subject.  Instructor reviews cardiac drug classes: antiplatelets, anticoagulants, beta blockers, and statins.  Instructor discusses reasons, side effects, and lifestyle considerations for each drug class.   Cardiac Drugs II:  -Group instruction provided by verbal instruction and written materials to support subject.  Instructor reviews cardiac drug classes: angiotensin converting enzyme inhibitors (ACE-I), angiotensin II receptor blockers (ARBs), nitrates, and calcium channel blockers.  Instructor discusses reasons, side effects, and lifestyle considerations for each drug class.   Anatomy and Physiology of the Circulatory System:  -Group instruction provided by verbal instruction,  video, and written materials to support subject.  Reviews functional anatomy of heart, how it relates to various diagnoses, and what role the heart plays in the overall system.   Knowledge Questionnaire Score:     Knowledge Questionnaire Score - 03/09/16 1638      Knowledge Questionnaire Score   Pre Score 23/24      Core Components/Risk Factors/Patient Goals at Admission:     Personal Goals and Risk Factors at Admission - 03/09/16 1535      Core  Components/Risk Factors/Patient Goals on Admission    Weight Management Weight Maintenance;Yes   Intervention Weight Management: Develop a combined nutrition and exercise program designed to reach desired caloric intake, while maintaining appropriate intake of nutrient and fiber, sodium and fats, and appropriate energy expenditure required for the weight goal.;Weight Management: Provide education and appropriate resources to help participant work on and attain dietary goals.   Admit Weight 189 lb 9.5 oz (86 kg)   Expected Outcomes Weight Maintenance: Understanding of the daily nutrition guidelines, which includes 25-35% calories from fat, 7% or less cal from saturated fats, less than 200mg  cholesterol, less than 1.5gm of sodium, & 5 or more servings of fruits and vegetables daily   Personal Goal Other Yes   Personal Goal Improve leg strength. be able to breathe better. Be able to get back to pre-hospitalization activities.   Intervention Provide individualized aerobic and resistance training program to help achieve goal of increased leg strength, and endurance, so that patient is better able to do ADLs and improve cardiorespiratory fitness, so that pt is abel to breathe better.   Expected Outcomes Be able to resume ADLs that he was able to do previously and to have less shortness of breath.      Core Components/Risk Factors/Patient Goals Review:      Goals and Risk Factor Review    Row Name 04/09/16 1417 04/23/16 1457 05/13/16 1058 05/28/16  1153       Core Components/Risk Factors/Patient Goals Review   Personal Goals Review Other Other Increase Strength and Stamina;Other Increase Strength and Stamina;Other    Review Patient walking 30 minutes, 2 days/week at home. Pt is feeling stronger and is breathing better since starting the program. [facility]. Breathing is better. Breathing is better and leg strength has increased, back to playing golf Breathing is better and leg strength has increased    Expected Outcomes Maintain aerobic exercise 4-5 days/week to achieve fitness goals. Maintain aerobic exercise 4-5 days/week to achieve fitness goals. Maintain aerobic exercise 4-5 days/week to achieve fitness goals. Maintain aerobic exercise 4-5 days/week to achieve fitness goals.       Core Components/Risk Factors/Patient Goals at Discharge (Final Review):      Goals and Risk Factor Review - 05/28/16 1153      Core Components/Risk Factors/Patient Goals Review   Personal Goals Review Increase Strength and Stamina;Other   Review Breathing is better and leg strength has increased   Expected Outcomes Maintain aerobic exercise 4-5 days/week to achieve fitness goals.      ITP Comments:     ITP Comments    Row Name 03/09/16 1338           ITP Comments Medical Director- Dr. Fransico Him, MD          Comments: [name] is making expected progress toward personal goals after completing 32 sessions. Recommend continued exercise and life style modification education including  stress management and relaxation techniques to decrease cardiac risk profile. Barnet Pall, RN,BSN 06/03/2016 10:44 AM

## 2016-06-02 ENCOUNTER — Telehealth: Payer: Self-pay

## 2016-06-02 ENCOUNTER — Ambulatory Visit (HOSPITAL_COMMUNITY): Payer: Medicare Other

## 2016-06-02 ENCOUNTER — Encounter (HOSPITAL_COMMUNITY)
Admission: RE | Admit: 2016-06-02 | Discharge: 2016-06-02 | Disposition: A | Discharge status: Home / Self Care | Payer: Medicare Other | Source: Ambulatory Visit | Attending: Internal Medicine | Admitting: Internal Medicine

## 2016-06-02 DIAGNOSIS — I5022 Chronic systolic (congestive) heart failure: Secondary | ICD-10-CM

## 2016-06-02 NOTE — Telephone Encounter (Signed)
Referred to ICM clinic by Levander Campion, device RN.  Returned call to patient due as he requested date of ICM remote transmission.  1st ICM encounter scheduled for 06/11/2016 and he agreed to monthly follow ups.  Advised transmission should be received automatically.   He has ICM phone number and encouraged to call for any fluid symptoms.

## 2016-06-04 ENCOUNTER — Ambulatory Visit (HOSPITAL_COMMUNITY): Payer: Medicare Other

## 2016-06-04 ENCOUNTER — Encounter (HOSPITAL_COMMUNITY)
Admission: RE | Admit: 2016-06-04 | Discharge: 2016-06-04 | Disposition: A | Discharge status: Home / Self Care | Payer: Medicare Other | Source: Ambulatory Visit | Attending: Internal Medicine | Admitting: Internal Medicine

## 2016-06-04 DIAGNOSIS — I5022 Chronic systolic (congestive) heart failure: Secondary | ICD-10-CM

## 2016-06-07 ENCOUNTER — Encounter (HOSPITAL_COMMUNITY)
Admission: RE | Admit: 2016-06-07 | Discharge: 2016-06-07 | Disposition: A | Discharge status: Home / Self Care | Payer: Medicare Other | Source: Ambulatory Visit | Attending: Internal Medicine | Admitting: Internal Medicine

## 2016-06-07 ENCOUNTER — Ambulatory Visit (HOSPITAL_COMMUNITY): Payer: Medicare Other

## 2016-06-07 DIAGNOSIS — I5022 Chronic systolic (congestive) heart failure: Secondary | ICD-10-CM

## 2016-06-09 ENCOUNTER — Encounter (HOSPITAL_COMMUNITY)
Admission: RE | Admit: 2016-06-09 | Discharge: 2016-06-09 | Disposition: A | Discharge status: Home / Self Care | Payer: Medicare Other | Source: Ambulatory Visit | Attending: Internal Medicine | Admitting: Internal Medicine

## 2016-06-09 ENCOUNTER — Ambulatory Visit (HOSPITAL_COMMUNITY): Payer: Medicare Other

## 2016-06-09 VITALS — BP 122/60 | HR 55 | Ht 73.0 in | Wt 194.4 lb

## 2016-06-09 DIAGNOSIS — I5022 Chronic systolic (congestive) heart failure: Secondary | ICD-10-CM

## 2016-06-09 NOTE — Progress Notes (Signed)
Discharge Summary  Patient Details  Name: Austin [name] MRN: [id number] Date of Birth: June 09, 1941 Referring Provider:   Flowsheet Row CARDIAC REHAB PHASE II ORIENTATION from 03/09/2016 in [place]  Referring Provider  Austin Peru MD       Number of Visits: 36  Reason for Discharge:  Patient independent in their exercise.  Smoking History:  History  Smoking Status  . Never Smoker  Smokeless Tobacco  . Never Used    Diagnosis:  Heart failure, chronic systolic (HCC)  ADL UCSD:   Initial Exercise Prescription:     Initial Exercise Prescription - 03/09/16 1600      Date of Initial Exercise RX and Referring Provider   Date 03/09/16   Referring Provider Austin Peru MD     Treadmill   MPH 2.3   Grade 1   Minutes 10   METs 3.08     Bike   Level 1   Minutes 10   METs 3.22     NuStep   Level 3   Minutes 10   METs 2.8     Prescription Details   Frequency (times per week) 3   Duration Progress to 30 minutes of continuous aerobic without signs/symptoms of physical distress     Intensity   THRR 40-80% of Max Heartrate 59-118   Ratings of Perceived Exertion 11-13   Perceived Dyspnea 0-4     Progression   Progression Continue to progress workloads to maintain intensity without signs/symptoms of physical distress.     Resistance Training   Training Prescription Yes   Weight 2lbs   Reps 10-12      Discharge Exercise Prescription (Final Exercise Prescription Changes):     Exercise Prescription Changes - 06/09/16 1200      Response to Exercise   Blood Pressure (Admit) 122/60   Blood Pressure (Exercise) 120/70   Blood Pressure (Exit) 120/60   Heart Rate (Admit) 55 bpm   Heart Rate (Exercise) 96 bpm   Heart Rate (Exit) 55 bpm   Rating of Perceived Exertion (Exercise) 12   Symptoms none   Comments Home exercise reviewed on 03/17/16.   Duration Progress to 30 minutes of continuous aerobic without signs/symptoms of  physical distress   Intensity THRR unchanged     Progression   Progression Continue to progress workloads to maintain intensity without signs/symptoms of physical distress.   Average METs 3.6     Resistance Training   Training Prescription Yes   Weight 4lbs     Interval Training   Interval Training No     Treadmill   MPH 3   Grade 1   Minutes 10   METs 3.71     Bike   Level 1.4   Minutes 10   METs 3.99     NuStep   Level 4   Minutes 10   METs 3.1     Home Exercise Plan   Plans to continue exercise at Home   Frequency Add 2 additional days to program exercise sessions.      Functional Capacity:     6 Minute Walk    Row Name 03/09/16 1635 05/28/16 1150       6 Minute Walk   Phase Initial Discharge    Distance 1412 feet 1504 feet    Distance % Change  - 6.5 %    Walk Time 6 minutes 6 minutes    # of Rest Breaks 0 0    MPH  2.67 2.8    METS 3.09 3.3    RPE 11 12    VO2 Peak 10.83 11.39    Symptoms No No    Resting HR 57 bpm 60 bpm    Resting BP 128/70 104/70    Max Ex. HR 85 bpm 88 bpm    Max Ex. BP 138/60 138/72    2 Minute Post BP 108/70 102/66       Psychological, QOL, Others - Outcomes: PHQ 2/9: Depression screen [facility] 2/9 06/09/2016 03/15/2016  Decreased Interest 0 0  Down, Depressed, Hopeless 0 0  PHQ - 2 Score 0 0  Some recent data might be hidden    Quality of Life:     Quality of Life - 06/07/16 1634      Quality of Life Scores   Health/Function Pre 23.63 %   Health/Function Post 28.4 %   Health/Function % Change 20.19 %   Socioeconomic Pre 29.64 %   Socioeconomic Post 30 %   Socioeconomic % Change  1.21 %   Psych/Spiritual Pre 27.21 %   Psych/Spiritual Post 30 %   Psych/Spiritual % Change 10.25 %   Family Pre 30 %   Family Post 30 %   Family % Change 0 %   GLOBAL Pre 26.54 %   GLOBAL Post 29.29 %   GLOBAL % Change 10.36 %      Personal Goals: Goals established at orientation with interventions provided to work toward  goal.     Personal Goals and Risk Factors at Admission - 03/09/16 1535      Core Components/Risk Factors/Patient Goals on Admission    Weight Management Weight Maintenance;Yes   Intervention Weight Management: Develop a combined nutrition and exercise program designed to reach desired caloric intake, while maintaining appropriate intake of nutrient and fiber, sodium and fats, and appropriate energy expenditure required for the weight goal.;Weight Management: Provide education and appropriate resources to help participant work on and attain dietary goals.   Admit Weight 189 lb 9.5 oz (86 kg)   Expected Outcomes Weight Maintenance: Understanding of the daily nutrition guidelines, which includes 25-35% calories from fat, 7% or less cal from saturated fats, less than 232m cholesterol, less than 1.5gm of sodium, & 5 or more servings of fruits and vegetables daily   Personal Goal Other Yes   Personal Goal Improve leg strength. be able to breathe better. Be able to get back to pre-hospitalization activities.   Intervention Provide individualized aerobic and resistance training program to help achieve goal of increased leg strength, and endurance, so that patient is better able to do ADLs and improve cardiorespiratory fitness, so that pt is abel to breathe better.   Expected Outcomes Be able to resume ADLs that he was able to do previously and to have less shortness of breath.       Personal Goals Discharge:     Goals and Risk Factor Review    Row Name 04/09/16 1417 04/23/16 1457 05/13/16 1058 05/28/16 1153 06/09/16 1144     Core Components/Risk Factors/Patient Goals Review   Personal Goals Review Other Other Increase Strength and Stamina;Other Increase Strength and Stamina;Other Increase Strength and Stamina;Other   Review Patient walking 30 minutes, 2 days/week at home. Pt is feeling stronger and is breathing better since starting the program[facility]  [facility]. Breathing is better. Breathing is better and leg strength has increased, back to playing golf Breathing is better and leg strength  has increased Austin [name] feels he's achieved his personal goals including improved leg strength and better breathing. Pt feels he's 50% back to his previous capabilities, and for the most part, is back to participating in prior activites including golfing. he's not quite back to playing tennis, but plans to conitnue his exercise routine by walking and going to the gym.   Expected Outcomes Maintain aerobic exercise 4-5 days/week to achieve fitness goals. Maintain aerobic exercise 4-5 days/week to achieve fitness goals. Maintain aerobic exercise 4-5 days/week to achieve fitness goals. Maintain aerobic exercise 4-5 days/week to achieve fitness goals. Maintain aerobic exercise 4-5 days/week to achieve strength and fitness goals.      Nutrition & Weight - Outcomes:     Pre Biometrics - 03/09/16 1634      Pre Biometrics   Height 6' 1" (1.854 m)   Weight 189 lb 9.5 oz (86 kg)   Waist Circumference 36 inches   Hip Circumference 40.75 inches   Waist to Hip Ratio 0.88 %   BMI (Calculated) 25.1   Triceps Skinfold 12 mm   % Body Fat 23.5 %   Grip Strength 39.5 kg   Flexibility 8 in   Single Leg Stand 1.03 seconds         Post Biometrics - 06/09/16 1211       Post  Biometrics   Height 6' 1" (1.854 m)   Weight 194 lb 7.1 oz (88.2 kg)   Waist Circumference 38 inches   Hip Circumference 41.5 inches   Waist to Hip Ratio 0.92 %   BMI (Calculated) 25.7   Triceps Skinfold 15 mm   % Body Fat 25.6 %   Grip Strength 41 kg   Flexibility 7 in   Single Leg Stand 2.17 seconds      Nutrition:     Nutrition Therapy & Goals - 03/10/16 1007      Nutrition Therapy   Diet Therapeutic Lifestyle Changes     Personal Nutrition Goals   Personal Goal #1 Maintain wt while in [place], educate and counsel regarding  individualized specific dietary modifications aiming towards targeted core components such as weight, hypertension, lipid management, diabetes, heart failure and other comorbidities.   Expected Outcomes Short Term Goal: Understand basic principles of dietary content, such as calories, fat, sodium, cholesterol and nutrients.;Long Term Goal: Adherence to prescribed nutrition plan.      Nutrition Discharge:     Nutrition Assessments - 06/14/16 1347      MEDFICTS Scores   Pre Score 70   Post Score 9   Score Difference -61      Education Questionnaire Score:     Knowledge Questionnaire Score - 06/07/16 1634      Knowledge Questionnaire Score   Post Score 22/24      Goals reviewed with patient; copy given to patient.Chuck graduated from cardiac rehab program today with completion of 36 exercise sessions in Phase II. Pt maintained good attendance and progressed nicely during his participation in rehab as evidenced by increased MET level.   Medication list reconciled. Repeat  PHQ score- 0 .  Pt has made significant lifestyle changes and should be commended for his success. Austin [name] feels he has achieved his goals during cardiac rehab.   [facility]. We are proud of  Chuck's progress. Austin [name] reports that he feels a lot stronger since he has been participating in  the program.  Harrell Gave RN BSN

## 2016-06-10 ENCOUNTER — Ambulatory Visit (INDEPENDENT_AMBULATORY_CARE_PROVIDER_SITE_OTHER): Payer: Medicare Other | Admitting: Internal Medicine

## 2016-06-10 DIAGNOSIS — Z5181 Encounter for therapeutic drug level monitoring: Secondary | ICD-10-CM

## 2016-06-10 DIAGNOSIS — I059 Rheumatic mitral valve disease, unspecified: Secondary | ICD-10-CM

## 2016-06-10 LAB — POCT INR: INR: 3.4

## 2016-06-11 ENCOUNTER — Ambulatory Visit (HOSPITAL_COMMUNITY): Payer: Medicare Other

## 2016-06-11 ENCOUNTER — Encounter (HOSPITAL_COMMUNITY)
Admission: RE | Admit: 2016-06-11 | Discharge: 2016-06-11 | Disposition: A | Discharge status: Home / Self Care | Payer: Medicare Other | Source: Ambulatory Visit | Attending: Internal Medicine | Admitting: Internal Medicine

## 2016-06-11 DIAGNOSIS — I5022 Chronic systolic (congestive) heart failure: Secondary | ICD-10-CM

## 2016-06-14 ENCOUNTER — Ambulatory Visit (HOSPITAL_COMMUNITY): Payer: Medicare Other

## 2016-06-14 ENCOUNTER — Encounter (HOSPITAL_COMMUNITY): Admission: RE | Admit: 2016-06-14 | Payer: Medicare Other | Source: Ambulatory Visit

## 2016-06-16 ENCOUNTER — Encounter (HOSPITAL_COMMUNITY): Payer: Medicare Other

## 2016-06-16 ENCOUNTER — Ambulatory Visit (HOSPITAL_COMMUNITY): Payer: Medicare Other

## 2016-06-18 ENCOUNTER — Ambulatory Visit (HOSPITAL_COMMUNITY): Payer: Medicare Other

## 2016-06-21 ENCOUNTER — Telehealth: Payer: Self-pay

## 2016-06-21 ENCOUNTER — Ambulatory Visit (HOSPITAL_COMMUNITY): Payer: Medicare Other

## 2016-06-21 ENCOUNTER — Ambulatory Visit (INDEPENDENT_AMBULATORY_CARE_PROVIDER_SITE_OTHER): Payer: Medicare Other

## 2016-06-21 ENCOUNTER — Encounter (HOSPITAL_COMMUNITY): Payer: Medicare Other

## 2016-06-21 DIAGNOSIS — Z95810 Presence of automatic (implantable) cardiac defibrillator: Secondary | ICD-10-CM

## 2016-06-21 DIAGNOSIS — I5022 Chronic systolic (congestive) heart failure: Secondary | ICD-10-CM

## 2016-06-21 NOTE — Progress Notes (Addendum)
EPIC Encounter for ICM Monitoring  Patient Name: Austin [name] is a 75 y.o. male Date: 06/21/2016 Primary Care Physican: Mathews Argyle, MD Primary Cardiologist: Tamala Julian Electrophysiologist: Lovena Le Dry Weight: unknown   Clinical Status (18-May-2016 to 21-Jun-2016) Treated VT/VF 0 episodes  AT/AF 1 episode  Time in AT/AF 24.0 hr/day (100.0%)  Observations (2) (18-May-2016 to 21-Jun-2016)  AT/AF >= 6 hr for 34 days.  V. Pacing less than 90%.   1st ICM encounter.   Attempted ICM call and unable to reach. Left message for return call.  Transmission reviewed.   Thoracic impedance normal   Recommendations:  NONE- unable to reach  Follow-up plan: ICM clinic phone appointment on 07/22/2016.  Copy of ICM check sent to device physician.   ICM trend: 06/21/2016       [name], RN 06/21/2016 11:46 AM

## 2016-06-21 NOTE — Telephone Encounter (Signed)
Remote ICM transmission received.  Attempted patient call and left message to return call.   

## 2016-06-23 ENCOUNTER — Ambulatory Visit (HOSPITAL_COMMUNITY): Payer: Medicare Other

## 2016-06-23 ENCOUNTER — Encounter (HOSPITAL_COMMUNITY): Payer: Medicare Other

## 2016-06-24 ENCOUNTER — Ambulatory Visit (INDEPENDENT_AMBULATORY_CARE_PROVIDER_SITE_OTHER): Payer: Medicare Other | Admitting: Cardiovascular Disease

## 2016-06-24 DIAGNOSIS — I059 Rheumatic mitral valve disease, unspecified: Secondary | ICD-10-CM

## 2016-06-24 DIAGNOSIS — Z5181 Encounter for therapeutic drug level monitoring: Secondary | ICD-10-CM

## 2016-06-24 LAB — POCT INR: INR: 4

## 2016-06-25 ENCOUNTER — Encounter (HOSPITAL_COMMUNITY): Payer: Medicare Other

## 2016-06-25 ENCOUNTER — Ambulatory Visit (HOSPITAL_COMMUNITY): Payer: Medicare Other

## 2016-06-25 NOTE — Progress Notes (Signed)
Patient returned call.  He stated he felt a little fluid accumulation and took extra Lasix this morning.  Current weight 192 lbs.  He stated he will be in [place] on 07/22/2016 and ICM remote transmission changed to 07/29/2016.  Encouraged to call for fluid symptoms.  No changes today.

## 2016-06-28 ENCOUNTER — Encounter (HOSPITAL_COMMUNITY): Payer: Medicare Other

## 2016-06-28 ENCOUNTER — Ambulatory Visit (HOSPITAL_COMMUNITY): Payer: Medicare Other

## 2016-06-30 ENCOUNTER — Encounter (HOSPITAL_COMMUNITY): Payer: Medicare Other

## 2016-06-30 ENCOUNTER — Ambulatory Visit (HOSPITAL_COMMUNITY): Payer: Medicare Other

## 2016-07-02 ENCOUNTER — Ambulatory Visit (HOSPITAL_COMMUNITY): Payer: Medicare Other

## 2016-07-02 ENCOUNTER — Encounter (HOSPITAL_COMMUNITY): Payer: Medicare Other

## 2016-07-05 ENCOUNTER — Ambulatory Visit (HOSPITAL_COMMUNITY): Payer: Medicare Other

## 2016-07-05 ENCOUNTER — Encounter (HOSPITAL_COMMUNITY): Payer: Medicare Other

## 2016-07-07 ENCOUNTER — Ambulatory Visit (HOSPITAL_COMMUNITY): Payer: Medicare Other

## 2016-07-07 ENCOUNTER — Encounter (HOSPITAL_COMMUNITY): Payer: Medicare Other

## 2016-07-08 ENCOUNTER — Ambulatory Visit (INDEPENDENT_AMBULATORY_CARE_PROVIDER_SITE_OTHER): Payer: Medicare Other | Admitting: Internal Medicine

## 2016-07-08 DIAGNOSIS — Z5181 Encounter for therapeutic drug level monitoring: Secondary | ICD-10-CM

## 2016-07-08 DIAGNOSIS — I059 Rheumatic mitral valve disease, unspecified: Secondary | ICD-10-CM

## 2016-07-08 LAB — POCT INR: INR: 3.1

## 2016-07-09 ENCOUNTER — Ambulatory Visit (HOSPITAL_COMMUNITY): Payer: Medicare Other

## 2016-07-09 ENCOUNTER — Encounter (HOSPITAL_COMMUNITY): Payer: Medicare Other

## 2016-07-12 ENCOUNTER — Encounter (HOSPITAL_COMMUNITY): Payer: Medicare Other

## 2016-07-12 ENCOUNTER — Ambulatory Visit (HOSPITAL_COMMUNITY): Payer: Medicare Other

## 2016-07-27 ENCOUNTER — Ambulatory Visit (INDEPENDENT_AMBULATORY_CARE_PROVIDER_SITE_OTHER): Payer: Medicare Other | Admitting: Cardiology

## 2016-07-27 DIAGNOSIS — I059 Rheumatic mitral valve disease, unspecified: Secondary | ICD-10-CM

## 2016-07-27 DIAGNOSIS — Z5181 Encounter for therapeutic drug level monitoring: Secondary | ICD-10-CM

## 2016-07-27 LAB — POCT INR: INR: 3

## 2016-07-29 ENCOUNTER — Ambulatory Visit (INDEPENDENT_AMBULATORY_CARE_PROVIDER_SITE_OTHER): Payer: Medicare Other

## 2016-07-29 DIAGNOSIS — Z95810 Presence of automatic (implantable) cardiac defibrillator: Secondary | ICD-10-CM | POA: Diagnosis not present

## 2016-07-29 DIAGNOSIS — I5022 Chronic systolic (congestive) heart failure: Secondary | ICD-10-CM | POA: Diagnosis not present

## 2016-07-29 NOTE — Progress Notes (Signed)
EPIC Encounter for ICM Monitoring  Patient Name: Austin [name] is a 76 y.o. male Date: 07/29/2016 Primary Care Physican: Mathews Argyle, MD Primary Cardiologist: Tamala Julian Electrophysiologist: Lovena Le Dry Weight:  196 lbs   Clinical Status (21-Jun-2016 to 29-Jul-2016) Treated VT/VF 0  AT/AF 1 episode  Time in AT/AF 24.0 hr/day (100.0%) Observations (2) (21-Jun-2016 to 29-Jul-2016)  AT/AF >= 6 hr for 38 days.      Heart Failure questions reviewed, pt asymptomatic.  He has gained a couple of pounds but feeling fine.  Advised to continue to monitor weight and he has prn Furosemide to take if needed  Thoracic impedance normal.  Recommendations: No changes.  Reinforced to limit low salt food choices to 2000 mg day and limiting fluid intake to < 2 liters per day. Encouraged to call for fluid symptoms.    Follow-up plan: ICM clinic phone appointment on .  Office appointment for defib check with Dr Lovena Le 09/13/2016  Copy of ICM check sent to device physician.   3 month ICM trend : 07/29/2016    1 Year ICM trend:      [name], RN 07/29/2016 11:13 AM

## 2016-08-05 ENCOUNTER — Other Ambulatory Visit: Payer: Self-pay | Admitting: Interventional Cardiology

## 2016-08-10 ENCOUNTER — Ambulatory Visit (INDEPENDENT_AMBULATORY_CARE_PROVIDER_SITE_OTHER): Payer: Medicare Other | Admitting: Internal Medicine

## 2016-08-10 DIAGNOSIS — I059 Rheumatic mitral valve disease, unspecified: Secondary | ICD-10-CM

## 2016-08-10 DIAGNOSIS — Z5181 Encounter for therapeutic drug level monitoring: Secondary | ICD-10-CM

## 2016-08-10 LAB — POCT INR: INR: 2.9

## 2016-08-24 ENCOUNTER — Ambulatory Visit (INDEPENDENT_AMBULATORY_CARE_PROVIDER_SITE_OTHER): Payer: Medicare Other | Admitting: Interventional Cardiology

## 2016-08-24 DIAGNOSIS — I059 Rheumatic mitral valve disease, unspecified: Secondary | ICD-10-CM

## 2016-08-24 DIAGNOSIS — Z5181 Encounter for therapeutic drug level monitoring: Secondary | ICD-10-CM

## 2016-08-24 LAB — POCT INR: INR: 2.8

## 2016-09-07 ENCOUNTER — Ambulatory Visit (INDEPENDENT_AMBULATORY_CARE_PROVIDER_SITE_OTHER): Payer: Medicare Other | Admitting: Sports Medicine

## 2016-09-07 ENCOUNTER — Encounter: Payer: Self-pay | Admitting: Sports Medicine

## 2016-09-07 ENCOUNTER — Ambulatory Visit (INDEPENDENT_AMBULATORY_CARE_PROVIDER_SITE_OTHER): Payer: Medicare Other | Admitting: Cardiovascular Disease

## 2016-09-07 DIAGNOSIS — M79674 Pain in right toe(s): Secondary | ICD-10-CM

## 2016-09-07 DIAGNOSIS — Z5181 Encounter for therapeutic drug level monitoring: Secondary | ICD-10-CM

## 2016-09-07 DIAGNOSIS — Z9889 Other specified postprocedural states: Secondary | ICD-10-CM | POA: Diagnosis not present

## 2016-09-07 DIAGNOSIS — I059 Rheumatic mitral valve disease, unspecified: Secondary | ICD-10-CM

## 2016-09-07 LAB — POCT INR: INR: 3.6

## 2016-09-07 NOTE — Progress Notes (Signed)
Subjective: Austin [name] is a 76 y.o. male patient returns to office today for follow up evaluation after having Right hallux total permanent nail avulsion performed on 03-22-16. Patient states dark color and wanted to make sure it is healing correctly. Patient denies fever/chills/nausea/vomitting/any other related constitutional symptoms at this time.  Patient Active Problem List   Diagnosis Date Noted  . Tachycardia 10/27/2015  . Hypothyroidism 10/27/2015  . H/O mitral valve replacement with mechanical valve   . V tach ([place]) 10/26/2015  . Ventricular tachycardia ([place]) 08/22/2015  . Encounter for therapeutic drug monitoring 08/20/2013  . Mitral valve disorders(424.0) 05/15/2013  . Heart valve replaced by other means 05/15/2013  . Cardiac device, implant, or graft infection or inflammation ([place]) 06/28/2011  . Cardiomyopathy 06/28/2011  . 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 Chronic systolic heart failure ([place]) 04/15/2009  . HYPERCHOLESTEROLEMIA 11/27/2008  . ERECTILE DYSFUNCTION 11/27/2008  . ABSCESS, SPINE, EPIDURAL 11/27/2008  . HEARING LOSS 11/27/2008  . MITRAL REGURGITATION, SEVERE 11/27/2008  . VENTRICULAR TACHYCARDIA 11/27/2008  . Atrial fibrillation ([place]) 11/27/2008  . LEFT VENTRICULAR FUNCTION, DECREASED 11/27/2008  . SYNCOPE 11/27/2008  . SLEEP APNEA 11/27/2008  . MITRAL VALVE REPLACEMENT, HX OF 11/27/2008    Current Outpatient Prescriptions on File Prior to Visit  Medication Sig Dispense Refill  . acetaminophen (TYLENOL) 500 MG tablet Take 1,000 mg by mouth every 6 (six) hours as needed (pain).    Marland Kitchen amiodarone (PACERONE) 200 MG tablet Take 1 tablet (200 mg total) by mouth daily.    . folic acid (FOLVITE) 1 MG tablet Take 1 tablet (1 mg total) by mouth at bedtime. 90 tablet 3  . furosemide (LASIX) 20 MG tablet Take 20 mg by mouth daily as needed. For weight over 205    . levothyroxine (SYNTHROID, LEVOTHROID) 75 MCG tablet Take 75 mcg  by mouth daily before breakfast.    . lisinopril (PRINIVIL,ZESTRIL) 10 MG tablet Take 10 mg by mouth at bedtime.   9  . metoprolol tartrate (LOPRESSOR) 25 MG tablet Take 0.5 tablets (12.5 mg total) by mouth 2 (two) times daily. 180 tablet 3  . Multiple Vitamin (MULTIVITAMIN WITH MINERALS) TABS tablet Take 1 tablet by mouth daily.    . Omega-3 Fatty Acids (FISH OIL) 1000 MG CAPS Take 1,000 mg by mouth at bedtime.     Marland Kitchen warfarin (COUMADIN) 5 MG tablet TAKE AS DIRECTED BY COUMADIN CLINIC 90 tablet 1   No current facility-administered medications on file prior to visit.     No Known Allergies  Objective:  General: Well developed, nourished, in no acute distress, alert and oriented x3   Dermatology: Skin is warm, dry and supple bilateral. Right hallux nail bed appears to be clean, dry, with surrounding eschar/scab that has not lifted off. (-) Erythema. (-) Edema. (-) serosanguous drainage present. The remaining nails appear unremarkable at this time. There are no other lesions or other signs of infection present.  Neurovascular status: Intact. No lower extremity swelling; No pain with calf compression bilateral.  Musculoskeletal: Decreased tenderness to palpation of the right hallux nail bed. Muscular strength within normal limits bilateral.   Assesement and Plan: Problem List Items Addressed This Visit    None    Visit Diagnoses    S/P nail surgery    -  Primary   Toe pain, right          -Examined patient  -Cleansed right hallux nail bed and gently scrubbed with peroxide and q-tip/curetted away eschar at  site and applied antibiotic cream covered with bandaid.  Explained to patient that dark area was scab and nothing else to be concerned about. -Patient was instructed to monitor the toe for reoccurrence and signs of infection; Patient advised to return to office or go to ER if toe becomes red, hot or swollen. -Patient is to return as needed or sooner if problems arise.  Landis Martins, DPM

## 2016-09-08 ENCOUNTER — Other Ambulatory Visit: Payer: Self-pay | Admitting: *Deleted

## 2016-09-08 MED ORDER — METOPROLOL TARTRATE 25 MG PO TABS: 12.5000 mg | ORAL_TABLET | Freq: Two times a day (BID) | ORAL | 2 refills | Status: DC

## 2016-09-09 ENCOUNTER — Other Ambulatory Visit: Payer: Medicare Other

## 2016-09-10 ENCOUNTER — Other Ambulatory Visit: Payer: Self-pay

## 2016-09-13 ENCOUNTER — Encounter: Payer: Self-pay | Admitting: Internal Medicine

## 2016-09-13 ENCOUNTER — Encounter: Payer: Medicare Other | Admitting: Internal Medicine

## 2016-09-13 ENCOUNTER — Ambulatory Visit (INDEPENDENT_AMBULATORY_CARE_PROVIDER_SITE_OTHER): Payer: Medicare Other | Admitting: Internal Medicine

## 2016-09-13 VITALS — BP 122/80 | HR 44 | Ht 73.5 in | Wt 202.8 lb

## 2016-09-13 DIAGNOSIS — Z95810 Presence of automatic (implantable) cardiac defibrillator: Secondary | ICD-10-CM | POA: Diagnosis not present

## 2016-09-13 DIAGNOSIS — I5022 Chronic systolic (congestive) heart failure: Secondary | ICD-10-CM

## 2016-09-13 DIAGNOSIS — I472 Ventricular tachycardia, unspecified: Secondary | ICD-10-CM

## 2016-09-13 DIAGNOSIS — R55 Syncope and collapse: Secondary | ICD-10-CM

## 2016-09-13 NOTE — Patient Instructions (Addendum)
Medication Instructions:  Your physician has recommended you make the following change in your medication:  1) STOP Metoprolol  Labwork: None Ordered   Testing/Procedures: None Ordered   Follow-Up: Your physician recommends that you schedule a follow-up appointment in: 3 months with Dr. Lovena Le     Any Other Special Instructions Will Be Listed Below (If Applicable).     If you need a refill on your cardiac medications before your next appointment, please call your pharmacy.

## 2016-09-13 NOTE — Progress Notes (Signed)
HPI [name] returns today for followup. He is a pleasant 76 yo man with a non-ischemic CM, chronic systolic heart failure, atrial fib, and VT. He underwent repeat catheter ablation several months ago. He has continued on amiodarone but his mexilitine was stopped. He has had no recurrent VT. His LV lead remains turned off as the threshold is very high. He has had some lightheaded spells but no palpitations. Today he c/o feeling weak. No energy.    No Known Allergies   Current Outpatient Prescriptions  Medication Sig Dispense Refill  . acetaminophen (TYLENOL) 500 MG tablet Take 1,000 mg by mouth every 6 (six) hours as needed (pain).    Marland Kitchen amiodarone (PACERONE) 200 MG tablet Take 1 tablet (200 mg total) by mouth daily.    Marland Kitchen amoxicillin (AMOXIL) 500 MG capsule TAKE 4 CAPSULES BY MOUTH 1 HOUR BEFORE DENTAL VISIT  0  . folic acid (FOLVITE) 1 MG tablet Take 1 tablet (1 mg total) by mouth at bedtime. 90 tablet 3  . furosemide (LASIX) 20 MG tablet Take 20 mg by mouth daily as needed. For weight over 205    . levothyroxine (SYNTHROID, LEVOTHROID) 75 MCG tablet Take 75 mcg by mouth daily before breakfast.    . lisinopril (PRINIVIL,ZESTRIL) 10 MG tablet Take 10 mg by mouth at bedtime.   9  . Multiple Vitamin (MULTIVITAMIN WITH MINERALS) TABS tablet Take 1 tablet by mouth daily.    . Omega-3 Fatty Acids (FISH OIL) 1000 MG CAPS Take 1,000 mg by mouth at bedtime.     Marland Kitchen warfarin (COUMADIN) 5 MG tablet TAKE AS DIRECTED BY COUMADIN CLINIC 90 tablet 1   No current facility-administered medications for this visit.      Past Medical History:  Diagnosis Date  . Atrial fibrillation ([place])   . 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 Conductive hearing loss   . Intraspinal abscess   . 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 Nonischemic cardiomyopathy ([place])   . Psychosexual dysfunction  with inhibited sexual excitement   . S/P mitral valve replacement   . Syncope and collapse   . Unspecified sleep apnea    last sleep study 11/07  . Ventricular tachycardia ([place])     ROS:   All systems reviewed and negative except as noted in the HPI.   Past Surgical History:  Procedure Laterality Date  . CARDIAC CATHETERIZATION  04/24/2002  . CARDIAC VALVE REPLACEMENT    . CARDIOVERSION  03/09/2012   Procedure: CARDIOVERSION;  Surgeon: Evans Lance, MD;  Location: [facility];  Service: Cardiovascular;  Laterality: N/A;  . CARDIOVERSION N/A 11/22/2013   Procedure: CARDIOVERSION;  Surgeon: Sanda Klein, MD;  Location: [facility];  Service: Cardiovascular;  Laterality: N/A;  . dental implants    . ELECTROPHYSIOLOGIC STUDY N/A 09/15/2015   Procedure: V Tach Ablation;  Surgeon: Evans Lance, MD;  Location: [facility];  Service: Cardiovascular;  Laterality: N/A;  . Evacution of epidural lumbar epidural abscess  1999  . INSERT / REPLACE / REMOVE PACEMAKER  11/2008  . MITRAL VALVE REPLACEMENT     w [address]  . PERMANENT PACEMAKER INSERTION N/A 08/05/2011   Procedure: PERMANENT PACEMAKER INSERTION;  Surgeon: Evans Lance, MD;  Location: [facility] C[facility] LAB;  Service: Cardiovascular;  Laterality: N/A;  . THYROIDECTOMY    . TONSILLECTOMY    . VALVE  REPLACEMENT  2000     Family History  Problem Relation Age of Onset  . Heart disease Mother   . Heart failure Mother   . Heart disease Father   . Heart failure Father      Social History   Social History  . Marital status: Married    Spouse name: N/A  . Number of children: N/A  . Years of education: N/A   Occupational History  . Not on file.   Social History Main Topics  . Smoking status: Never Smoker  . Smokeless tobacco: Never Used  . Alcohol use 0.0 oz/week     Comment: occasionally  . Drug use: No  . Sexual activity: Not on file   Other Topics Concern  . Not on file   Social History Narrative  . No  narrative on file     BP 122/80   Pulse (!) 44   Ht 6' 1.5" (1.867 m)   Wt 202 lb 12.8 oz (92 kg)   BMI 26.39 kg/m   Physical Exam:  Well appearing 76 year old man, NAD HEENT: Unremarkable Neck:  7 cm JVD, no thyromegally Back:  No CVA tenderness Lungs:  Clear with no wheezes, rales, or rhonchi.  Well-healed ICD incision HEART:  Regular rate rhythm, soft systolic murmur, no rubs, no clicks, mechanical S1. Abd:  soft, positive bowel sounds, no organomegally, no rebound, no guarding Ext:  2 plus pulses, trace peripheral edema, no cyanosis, no clubbing Skin:  No rashes no nodules Neuro:  CN II through XII intact, motor grossly intact  DEVICE  Normal device function.  See PaceArt for details.   Assess/Plan: 1. VT - he has not had recurrent VT. Will continue amio 200 daily. I will reduce if he has had no VT when I see him back.  2. Chronic systolic heart failure - his symptoms are stable. Hopefully we can keep his symptoms class 2. He appears to be pacing in the RV. I have asked him to stop his low dose metoprolol. 3. BiV ICD - his device is stable. He is programmed to allow him to conduct on his own. His LV threshold is high. 4. Atrial fib - he will continue his current meds. His rate is controlled. 5. Anti-coag - with his mechanical mitral valve, will continue coumadin  Mikle Bosworth.D.

## 2016-09-15 LAB — CUP PACEART INCLINIC DEVICE CHECK
Battery Voltage: 2.66 V
Brady Statistic AP VP Percent: 0.53 %
Brady Statistic AS VS Percent: 77.08 %
HIGH POWER IMPEDANCE MEASURED VALUE: 304 Ohm
HIGH POWER IMPEDANCE MEASURED VALUE: 71 Ohm
Implantable Lead Implant Date: 20130110
Implantable Lead Implant Date: 20130110
Implantable Lead Location: 753858
Implantable Lead Model: 4196
Implantable Lead Model: 6935
Implantable Pulse Generator Implant Date: 20130110
Lead Channel Impedance Value: 399 Ohm
Lead Channel Impedance Value: 418 Ohm
Lead Channel Impedance Value: 532 Ohm
Lead Channel Pacing Threshold Amplitude: 0.875 V
Lead Channel Pacing Threshold Amplitude: 3.5 V
Lead Channel Pacing Threshold Pulse Width: 1.2 ms
Lead Channel Sensing Intrinsic Amplitude: 1.125 mV
Lead Channel Sensing Intrinsic Amplitude: 4.625 mV
Lead Channel Setting Pacing Amplitude: 2 V
MDC IDC LEAD IMPLANT DT: 20130110
MDC IDC LEAD LOCATION: 753859
MDC IDC LEAD LOCATION: 753860
MDC IDC MSMT LEADCHNL LV IMPEDANCE VALUE: 418 Ohm
MDC IDC MSMT LEADCHNL LV IMPEDANCE VALUE: 836 Ohm
MDC IDC MSMT LEADCHNL RV PACING THRESHOLD PULSEWIDTH: 0.4 ms
MDC IDC SESS DTM: 20180219203232
MDC IDC SET LEADCHNL RV PACING AMPLITUDE: 2.5 V
MDC IDC SET LEADCHNL RV PACING PULSEWIDTH: 0.4 ms
MDC IDC SET LEADCHNL RV SENSING SENSITIVITY: 0.3 mV
MDC IDC STAT BRADY AP VS PERCENT: 0.08 %
MDC IDC STAT BRADY AS VP PERCENT: 22.32 %
MDC IDC STAT BRADY RA PERCENT PACED: 0.38 %
MDC IDC STAT BRADY RV PERCENT PACED: 14.75 %

## 2016-09-17 ENCOUNTER — Other Ambulatory Visit: Payer: Self-pay | Admitting: Physician Assistant

## 2016-09-22 ENCOUNTER — Ambulatory Visit (INDEPENDENT_AMBULATORY_CARE_PROVIDER_SITE_OTHER): Payer: Medicare Other | Admitting: Internal Medicine

## 2016-09-22 DIAGNOSIS — Z5181 Encounter for therapeutic drug level monitoring: Secondary | ICD-10-CM

## 2016-09-22 DIAGNOSIS — I059 Rheumatic mitral valve disease, unspecified: Secondary | ICD-10-CM

## 2016-09-22 LAB — POCT INR: INR: 3.9

## 2016-10-04 NOTE — Progress Notes (Signed)
ICM remote transmission rescheduled from 10/14/2016 to 10/21/2016.

## 2016-10-05 ENCOUNTER — Ambulatory Visit (INDEPENDENT_AMBULATORY_CARE_PROVIDER_SITE_OTHER): Payer: Medicare Other

## 2016-10-05 DIAGNOSIS — I059 Rheumatic mitral valve disease, unspecified: Secondary | ICD-10-CM

## 2016-10-05 DIAGNOSIS — Z5181 Encounter for therapeutic drug level monitoring: Secondary | ICD-10-CM

## 2016-10-05 LAB — POCT INR: INR: 3.8

## 2016-10-19 ENCOUNTER — Ambulatory Visit (INDEPENDENT_AMBULATORY_CARE_PROVIDER_SITE_OTHER): Payer: Medicare Other

## 2016-10-19 DIAGNOSIS — I059 Rheumatic mitral valve disease, unspecified: Secondary | ICD-10-CM

## 2016-10-19 DIAGNOSIS — Z5181 Encounter for therapeutic drug level monitoring: Secondary | ICD-10-CM

## 2016-10-19 LAB — POCT INR: INR: 3.2

## 2016-10-21 ENCOUNTER — Ambulatory Visit (INDEPENDENT_AMBULATORY_CARE_PROVIDER_SITE_OTHER): Payer: Medicare Other

## 2016-10-21 DIAGNOSIS — I5022 Chronic systolic (congestive) heart failure: Secondary | ICD-10-CM

## 2016-10-21 DIAGNOSIS — Z95810 Presence of automatic (implantable) cardiac defibrillator: Secondary | ICD-10-CM | POA: Diagnosis not present

## 2016-10-22 NOTE — Progress Notes (Signed)
EPIC Encounter for ICM Monitoring  Patient Name: Austin [name] is a 76 y.o. male Date: 10/22/2016 Primary Care Physican: Mathews Argyle, MD Primary Cardiologist:Smith Electrophysiologist: Lovena Le Dry Weight:196 lbs    Clinical Status (13-Sep-2016 to 21-Oct-2016) Treated VT/VF 0 episodes  AT/AF 1 episode  Time in AT/AF 24.0 hr/day (100.0%)  Observations (2) (13-Sep-2016 to 21-Oct-2016)  AT/AF >= 6 hr for 38 days.  V. Pacing less than 90%.      Heart Failure questions reviewed, pt asymptomatic.   Thoracic impedance normal.  Prescribed and confirmed dosage: Furosemide 20 mg 1 tablet as needed.  Taken a couple times in the last month.   Labs: 10/28/2015 Creatinine 1.55, BUN 27, Potassium 3.9, Sodium 139, EGFR 43-50  Recommendations: No changes. Reminded to limit dietary salt intake to 2000 mg/day and fluid intake to < 2 liters/day. Encouraged to call for fluid symptoms.  Follow-up plan: ICM clinic phone appointment on 11/22/2016.  Defib office check with Dr Lovena Le 12/15/2016  Copy of ICM check sent to device physician.   3 month ICM trend: 10/21/2016   1 Year ICM trend:      [name], RN 10/22/2016 8:15 AM

## 2016-11-02 ENCOUNTER — Ambulatory Visit (INDEPENDENT_AMBULATORY_CARE_PROVIDER_SITE_OTHER): Payer: Medicare Other | Admitting: Interventional Cardiology

## 2016-11-02 DIAGNOSIS — I059 Rheumatic mitral valve disease, unspecified: Secondary | ICD-10-CM

## 2016-11-02 DIAGNOSIS — Z5181 Encounter for therapeutic drug level monitoring: Secondary | ICD-10-CM

## 2016-11-02 LAB — POCT INR: INR: 3.3

## 2016-11-17 ENCOUNTER — Ambulatory Visit (INDEPENDENT_AMBULATORY_CARE_PROVIDER_SITE_OTHER): Payer: Medicare Other | Admitting: Cardiology

## 2016-11-17 DIAGNOSIS — Z5181 Encounter for therapeutic drug level monitoring: Secondary | ICD-10-CM

## 2016-11-17 LAB — POCT INR: INR: 3.7

## 2016-11-22 ENCOUNTER — Telehealth: Payer: Self-pay

## 2016-11-22 ENCOUNTER — Telehealth: Payer: Self-pay | Admitting: Cardiology

## 2016-11-22 MED ORDER — FUROSEMIDE 20 MG PO TABS: 20.0000 mg | ORAL_TABLET | Freq: Every day | ORAL | 3 refills | Status: DC | PRN

## 2016-11-22 NOTE — Telephone Encounter (Signed)
LMOVM reminding pt to send remote transmission.   

## 2016-11-22 NOTE — Telephone Encounter (Signed)
Patient refill request sent to pharmacy for furosemide.

## 2016-11-26 NOTE — Progress Notes (Unsigned)
No ICM remote transmission received for 11/22/2016 and next ICM transmission scheduled for 01/18/2017 since he has defib office appointment 12/15/2016 with Dr Lovena Le.

## 2016-11-29 ENCOUNTER — Encounter: Payer: Self-pay | Admitting: Internal Medicine

## 2016-12-01 ENCOUNTER — Ambulatory Visit (INDEPENDENT_AMBULATORY_CARE_PROVIDER_SITE_OTHER): Payer: Medicare Other | Admitting: Interventional Cardiology

## 2016-12-01 DIAGNOSIS — Z5181 Encounter for therapeutic drug level monitoring: Secondary | ICD-10-CM

## 2016-12-01 LAB — POCT INR: INR: 4.9

## 2016-12-07 ENCOUNTER — Ambulatory Visit (INDEPENDENT_AMBULATORY_CARE_PROVIDER_SITE_OTHER): Payer: Self-pay | Admitting: Cardiology

## 2016-12-07 DIAGNOSIS — Z5181 Encounter for therapeutic drug level monitoring: Secondary | ICD-10-CM

## 2016-12-07 LAB — POCT INR: INR: 2.9

## 2016-12-15 ENCOUNTER — Ambulatory Visit (INDEPENDENT_AMBULATORY_CARE_PROVIDER_SITE_OTHER): Payer: Medicare Other | Admitting: Internal Medicine

## 2016-12-15 ENCOUNTER — Encounter: Payer: Self-pay | Admitting: Internal Medicine

## 2016-12-15 VITALS — BP 104/70 | HR 50 | Ht 73.0 in | Wt 198.0 lb

## 2016-12-15 DIAGNOSIS — Z79899 Other long term (current) drug therapy: Secondary | ICD-10-CM | POA: Diagnosis not present

## 2016-12-15 DIAGNOSIS — I472 Ventricular tachycardia, unspecified: Secondary | ICD-10-CM

## 2016-12-15 DIAGNOSIS — M791 Myalgia, unspecified site: Secondary | ICD-10-CM

## 2016-12-15 DIAGNOSIS — I5022 Chronic systolic (congestive) heart failure: Secondary | ICD-10-CM | POA: Diagnosis not present

## 2016-12-15 DIAGNOSIS — Z95810 Presence of automatic (implantable) cardiac defibrillator: Secondary | ICD-10-CM

## 2016-12-15 LAB — CUP PACEART INCLINIC DEVICE CHECK
Brady Statistic AP VS Percent: 0.01 %
Brady Statistic AS VS Percent: 85.76 %
Date Time Interrogation Session: 20180523165600
HIGH POWER IMPEDANCE MEASURED VALUE: 342 Ohm
HIGH POWER IMPEDANCE MEASURED VALUE: 74 Ohm
Implantable Lead Implant Date: 20130110
Implantable Lead Implant Date: 20130110
Implantable Lead Location: 753858
Implantable Lead Location: 753859
Implantable Lead Location: 753860
Implantable Lead Model: 5076
Implantable Pulse Generator Implant Date: 20130110
Lead Channel Impedance Value: 418 Ohm
Lead Channel Impedance Value: 456 Ohm
Lead Channel Impedance Value: 513 Ohm
Lead Channel Pacing Threshold Amplitude: 1 V
Lead Channel Pacing Threshold Pulse Width: 1.2 ms
Lead Channel Sensing Intrinsic Amplitude: 0.625 mV
Lead Channel Sensing Intrinsic Amplitude: 1.25 mV
Lead Channel Sensing Intrinsic Amplitude: 3.75 mV
Lead Channel Sensing Intrinsic Amplitude: 4.5 mV
Lead Channel Setting Pacing Amplitude: 2 V
Lead Channel Setting Pacing Pulse Width: 0.4 ms
MDC IDC LEAD IMPLANT DT: 20130110
MDC IDC MSMT BATTERY VOLTAGE: 2.63 V
MDC IDC MSMT LEADCHNL LV IMPEDANCE VALUE: 836 Ohm
MDC IDC MSMT LEADCHNL LV PACING THRESHOLD AMPLITUDE: 3.5 V
MDC IDC MSMT LEADCHNL RA IMPEDANCE VALUE: 456 Ohm
MDC IDC MSMT LEADCHNL RV PACING THRESHOLD PULSEWIDTH: 0.4 ms
MDC IDC SET LEADCHNL RV PACING AMPLITUDE: 2.5 V
MDC IDC SET LEADCHNL RV SENSING SENSITIVITY: 0.3 mV
MDC IDC STAT BRADY AP VP PERCENT: 0.05 %
MDC IDC STAT BRADY AS VP PERCENT: 14.18 %
MDC IDC STAT BRADY RA PERCENT PACED: 0.02 %
MDC IDC STAT BRADY RV PERCENT PACED: 5.77 %

## 2016-12-15 MED ORDER — AMIODARONE HCL 200 MG PO TABS
ORAL_TABLET | ORAL | 1 refills | Status: DC
Start: 2016-12-15 — End: 2017-03-23

## 2016-12-15 NOTE — Progress Notes (Signed)
HPI  [name] returns today for followup. He is a pleasant 76 yo man with a non-ischemic CM, chronic systolic heart failure, atrial fib, and VT. He underwent repeat catheter ablation several months ago. His VT has not recurred and we have gradually reduced his dose of amiodarone. He c/o fatigue, muscle aches and weakness. No edema but a recently non-invasive vascular study at home suggested he might have peripheral vascular disease in the right lower leg. He does not complain of claudication. The patient denies any ICD shock. He is approaching ERI.   No Known Allergies   Current Outpatient Prescriptions  Medication Sig Dispense Refill  . acetaminophen (TYLENOL) 500 MG tablet Take 1,000 mg by mouth every 6 (six) hours as needed (pain).    Marland Kitchen amoxicillin (AMOXIL) 500 MG capsule TAKE 4 CAPSULES BY MOUTH 1 HOUR BEFORE DENTAL VISIT  0  . folic acid (FOLVITE) 1 MG tablet Take 1 tablet (1 mg total) by mouth at bedtime. 90 tablet 3  . furosemide (LASIX) 20 MG tablet Take 1 tablet (20 mg total) by mouth daily as needed. For weight over 205 30 tablet 3  . levothyroxine (SYNTHROID, LEVOTHROID) 75 MCG tablet Take 75 mcg by mouth daily before breakfast.    . lisinopril (PRINIVIL,ZESTRIL) 10 MG tablet Take 10 mg by mouth at bedtime.   9  . Multiple Vitamin (MULTIVITAMIN WITH MINERALS) TABS tablet Take 1 tablet by mouth daily.    . Omega-3 Fatty Acids (FISH OIL) 1000 MG CAPS Take 1,000 mg by mouth at bedtime.     Marland Kitchen warfarin (COUMADIN) 5 MG tablet TAKE AS DIRECTED BY COUMADIN CLINIC 90 tablet 1  . amiodarone (PACERONE) 200 MG tablet 100 mg (1/2 tablet) twice a day Monday-Friday. 100 mg (1/2 tablet) daily Saturday and Sunday. 90 tablet 1   No current facility-administered medications for this visit.      Past Medical History:  Diagnosis Date  . Atrial fibrillation ([place])   . 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 Conductive  hearing loss   . Intraspinal abscess   . 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 Nonischemic cardiomyopathy ([place])   . Psychosexual dysfunction with inhibited sexual excitement   . S/P mitral valve replacement   . Syncope and collapse   . Unspecified sleep apnea    last sleep study 11/07  . Ventricular tachycardia ([place])     ROS:   All systems reviewed and negative except as noted in the HPI.   Past Surgical History:  Procedure Laterality Date  . CARDIAC CATHETERIZATION  04/24/2002  . CARDIAC VALVE REPLACEMENT    . CARDIOVERSION  03/09/2012   Procedure: CARDIOVERSION;  Surgeon: Evans Lance, MD;  Location: [facility];  Service: Cardiovascular;  Laterality: N/A;  . CARDIOVERSION N/A 11/22/2013   Procedure: CARDIOVERSION;  Surgeon: Sanda Klein, MD;  [facility]: [facility];  Service: Cardiovascular;  Laterality: N/A;  . dental implants    . ELECTROPHYSIOLOGIC STUDY N/A 09/15/2015   Procedure: V Tach Ablation;  Surgeon: Evans Lance, MD;  Location: [facility];  Service: Cardiovascular;  Laterality: N/A;  . Evacution of epidural lumbar epidural abscess  1999  . INSERT / REPLACE / REMOVE PACEMAKER  11/2008  . MITRAL VALVE REPLACEMENT     w #[facility]  . PERMANENT PACEMAKER INSERTION N/A 08/05/2011   Procedure: PERMANENT PACEMAKER INSERTION;  Surgeon: Carleene Overlie  Peyton Najjar, MD;  Location: [facility] CATH LAB;  Service: Cardiovascular;  Laterality: N/A;  . THYROIDECTOMY    . TONSILLECTOMY    . VALVE REPLACEMENT  2000     Family History  Problem Relation Age of Onset  . Heart disease Mother   . Heart failure Mother   . Heart disease Father   . Heart failure Father      Social History   Social History  . Marital status: Married    Spouse name: N/A  . Number of children: N/A  . Years of education: N/A   Occupational History  . Not on file.   Social History Main Topics  . Smoking status: Never Smoker  . Smokeless tobacco: Never  Used  . Alcohol use 0.0 oz/week     Comment: occasionally  . Drug use: No  . Sexual activity: Not on file   Other Topics Concern  . Not on file   Social History Narrative  . No narrative on file     BP 104/70   Pulse (!) 50   Ht '6\' 1"'  (1.854 m)   Wt 198 lb (89.8 kg)   SpO2 98%   BMI 26.12 kg/m   Physical Exam:  Well appearing 76 year old man, NAD HEENT: Unremarkable Neck:  7 cm JVD, no thyromegally Back:  No CVA tenderness Lungs:  Clear with no wheezes, rales, or rhonchi.  Well-healed ICD incision HEART:  Regular rate rhythm, soft systolic murmur, no rubs, no clicks, mechanical S1. Abd:  soft, positive bowel sounds, no organomegally, no rebound, no guarding Ext:  2 plus pulses, trace peripheral edema, no cyanosis, no clubbing, venous insufficiency is present. Skin:  No rashes no nodules Neuro:  CN II through XII intact, motor grossly intact  DEVICE  Normal device function.  See PaceArt for details.   Assess/Plan: 1. VT - he has not had recurrent VT. Will continue amio 200 daily except I will ask him to reduce his dose to 100 mg daily on Saturday and Sunday.  I will reduce if he has had no VT when I see him back.  2. Chronic systolic heart failure - his symptoms are stable. His amount of pacing has gone from nearly 100% to 85%. 3. BiV ICD - his device is stable. He is programmed to allow him to conduct on his own. His LV threshold is high. He is approaching ERI 4. Atrial fib - he will continue his current meds. His rate is controlled. 5. Anti-coag - with his mechanical mitral valve, will continue coumadin 6. Muscle aches - this is a new problem. I will ask him to obtain a ESR, TSH and T4, and CPK.  Mikle Bosworth.D.

## 2016-12-15 NOTE — Patient Instructions (Addendum)
Medication Instructions:  DECREASE Amiodarone to 100 mg (1/2 tablet) twice a day Monday-Friday and 100 mg (1/2 tablet) daily on Saturday and Sunday   Labwork: TODAY - TSH / Free T4, CPK, Sed Rate   Testing/Procedures:  None Ordered    Follow-Up: Your physician recommends that you schedule a follow-up appointment in: 3 months with Dr. Lovena Le   Remote monitoring is used to monitor your ICD from home. This monitoring reduces the number of office visits required to check your device to one time per year. It allows [place] to keep an eye on the functioning of your device to ensure it is working properly. You are scheduled for a device check from home on 03/16/17. You may send your transmission at any time that day. If you have a wireless device, the transmission will be sent automatically. After your physician reviews your transmission, you will receive a postcard with your next transmission date.     Any Other Special Instructions Will Be Listed Below (If Applicable).     If you need a refill on your cardiac medications before your next appointment, please call your pharmacy.

## 2016-12-16 LAB — T4, FREE: FREE T4: 1.92 ng/dL — AB (ref 0.82–1.77)

## 2016-12-16 LAB — TSH: TSH: 2.38 u[IU]/mL (ref 0.450–4.500)

## 2016-12-16 LAB — SEDIMENTATION RATE: SED RATE: 5 mm/h (ref 0–30)

## 2016-12-16 LAB — CK: Total CK: 201 U/L (ref 24–204)

## 2016-12-21 ENCOUNTER — Ambulatory Visit (INDEPENDENT_AMBULATORY_CARE_PROVIDER_SITE_OTHER): Payer: Medicare Other | Admitting: Interventional Cardiology

## 2016-12-21 DIAGNOSIS — Z5181 Encounter for therapeutic drug level monitoring: Secondary | ICD-10-CM

## 2016-12-21 LAB — POCT INR: INR: 3.3

## 2016-12-28 IMAGING — CR DG CHEST 1V PORT
1 series · 1 of 1 positions shown · non-contrast
Comparison: PA and lateral chest 06/07/2014.

CLINICAL DATA: Hypotension and dizziness today.  Initial encounter.

EXAM:
PORTABLE CHEST 1 VIEW

[AP]
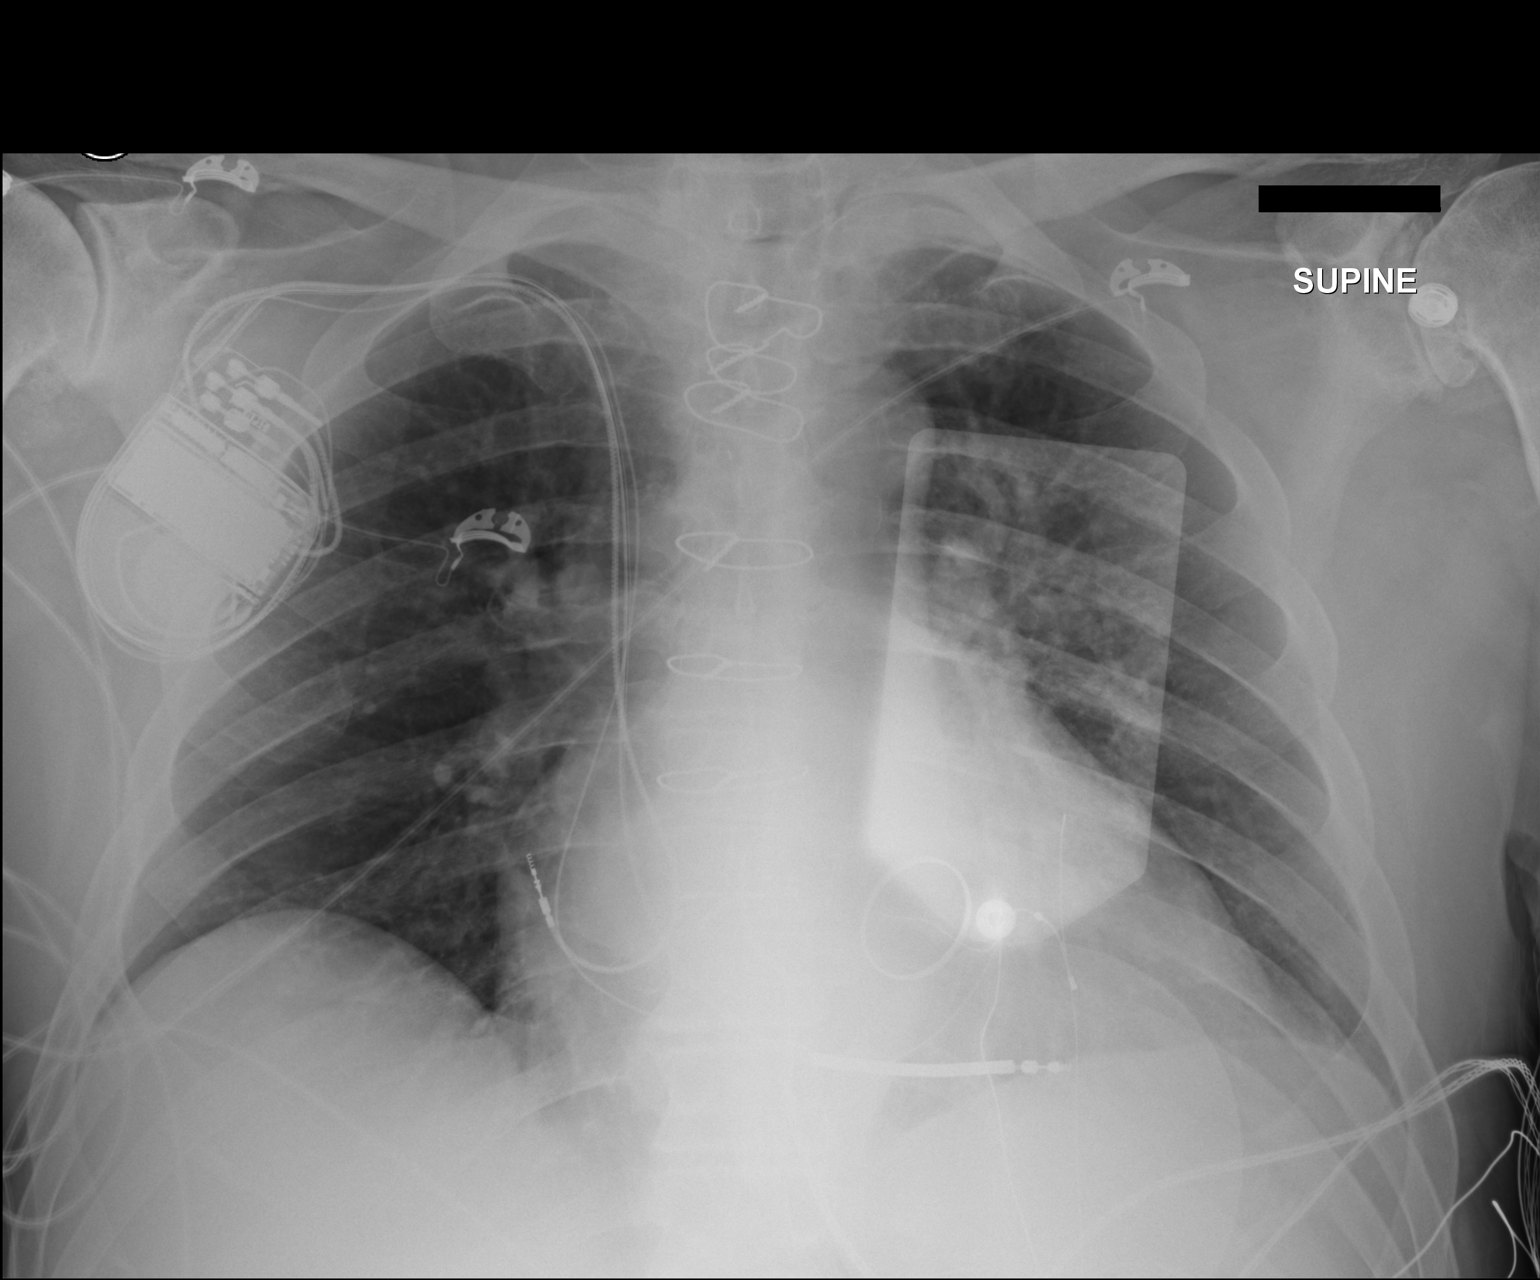

[1 of 1 positions shown; findings below may reference images not displayed]

FINDINGS: Defibrillator pad is in place. Pacing device is noted. The patient
is status post median sternotomy and cardiac valve repair. There is
cardiomegaly without edema. Lungs are clear. No pneumothorax or
pleural effusion. Degenerative disease about the shoulders noted.
IMPRESSION: Cardiomegaly without acute disease.

## 2017-01-05 ENCOUNTER — Ambulatory Visit (INDEPENDENT_AMBULATORY_CARE_PROVIDER_SITE_OTHER): Payer: Medicare Other | Admitting: Cardiovascular Disease

## 2017-01-05 DIAGNOSIS — Z5181 Encounter for therapeutic drug level monitoring: Secondary | ICD-10-CM

## 2017-01-05 LAB — POCT INR: INR: 3.3

## 2017-01-18 ENCOUNTER — Telehealth: Payer: Self-pay

## 2017-01-18 ENCOUNTER — Ambulatory Visit (INDEPENDENT_AMBULATORY_CARE_PROVIDER_SITE_OTHER): Payer: Medicare Other

## 2017-01-18 DIAGNOSIS — I5022 Chronic systolic (congestive) heart failure: Secondary | ICD-10-CM

## 2017-01-18 DIAGNOSIS — Z95810 Presence of automatic (implantable) cardiac defibrillator: Secondary | ICD-10-CM | POA: Diagnosis not present

## 2017-01-18 NOTE — Progress Notes (Signed)
EPIC Encounter for ICM Monitoring  Patient Name: Austin [name] is a 76 y.o. male Date: 01/18/2017 Primary Care Physican: Lajean Manes, MD Primary Cardiologist:Smith Electrophysiologist: Druscilla Brownie Weight:Last known weight 196 lbs  Time in AT/AF 24.0 hr/day (100.0%) Longest AT/AF 10 months      Attempted call to patient and unable to reach.  Left detailed message regarding transmission.  Transmission reviewed.    Thoracic impedance normal.  Prescribed dosage: Furosemide 20 mg 1 tablet as needed.   Labs: 10/28/2015 Creatinine 1.55, BUN 27, Potassium 3.9, Sodium 139, EGFR 43-50  Recommendations: Left voice mail with ICM number and encouraged to call for fluid symptoms.  Follow-up plan: ICM clinic phone appointment on 02/21/2017.  Office appointment scheduled 03/23/2017 with Dr. Lovena Le.  Copy of ICM check sent to device physician.   3 month ICM trend: 01/18/2017   1 Year ICM trend:      [name], RN 01/18/2017 11:08 AM

## 2017-01-18 NOTE — Telephone Encounter (Signed)
Remote ICM transmission received.  Attempted patient call and left detailed message regarding transmission and next ICM scheduled for 02/21/2017.  Advised to return call for any fluid symptoms or questions.

## 2017-01-19 ENCOUNTER — Ambulatory Visit (INDEPENDENT_AMBULATORY_CARE_PROVIDER_SITE_OTHER): Payer: Medicare Other | Admitting: Cardiology

## 2017-01-19 DIAGNOSIS — Z5181 Encounter for therapeutic drug level monitoring: Secondary | ICD-10-CM

## 2017-01-19 LAB — POCT INR: INR: 3.4

## 2017-02-02 ENCOUNTER — Ambulatory Visit (INDEPENDENT_AMBULATORY_CARE_PROVIDER_SITE_OTHER): Payer: Medicare Other | Admitting: Pharmacist

## 2017-02-02 DIAGNOSIS — Z5181 Encounter for therapeutic drug level monitoring: Secondary | ICD-10-CM

## 2017-02-02 LAB — POCT INR: INR: 3

## 2017-02-05 ENCOUNTER — Other Ambulatory Visit: Payer: Self-pay | Admitting: Interventional Cardiology

## 2017-02-18 ENCOUNTER — Ambulatory Visit (INDEPENDENT_AMBULATORY_CARE_PROVIDER_SITE_OTHER): Payer: Medicare Other | Admitting: Internal Medicine

## 2017-02-18 DIAGNOSIS — Z5181 Encounter for therapeutic drug level monitoring: Secondary | ICD-10-CM

## 2017-02-18 LAB — POCT INR: INR: 3.3

## 2017-02-21 ENCOUNTER — Telehealth: Payer: Self-pay | Admitting: Cardiology

## 2017-02-21 ENCOUNTER — Ambulatory Visit (INDEPENDENT_AMBULATORY_CARE_PROVIDER_SITE_OTHER): Payer: Medicare Other | Admitting: *Deleted

## 2017-02-21 DIAGNOSIS — I5022 Chronic systolic (congestive) heart failure: Secondary | ICD-10-CM | POA: Diagnosis not present

## 2017-02-21 DIAGNOSIS — I472 Ventricular tachycardia, unspecified: Secondary | ICD-10-CM

## 2017-02-21 DIAGNOSIS — Z95810 Presence of automatic (implantable) cardiac defibrillator: Secondary | ICD-10-CM | POA: Diagnosis not present

## 2017-02-21 NOTE — Telephone Encounter (Signed)
F/u message ° °Pt returning call. Please call back to discuss  °

## 2017-02-21 NOTE — Telephone Encounter (Signed)
Spoke w/ pt and informed him that his remote transmission was not automatic and that he will need to send a manual transmission. Pt verbalized understanding.

## 2017-02-21 NOTE — Telephone Encounter (Signed)
LMOVM reminding pt to send remote transmission.   

## 2017-02-22 NOTE — Progress Notes (Signed)
EPIC Encounter for ICM Monitoring  Patient Name: Austin [name] is a 76 y.o. male Date: 02/22/2017 Primary Care Physican: Lajean Manes, MD Primary Cardiologist:Smith Electrophysiologist: Druscilla Brownie Weight:Last known weight 196 lbs  Since 18-Jan-2017 Time in AT/AF 24.0 hr/day (100.0%)  Longest AT/AF 11 months       Heart Failure questions reviewed, pt asymptomatic .   Thoracic impedance normal.  Prescribed dosage: Furosemide 20 mg 1 tablet as needed.   Labs: 10/28/2015 Creatinine 1.55, BUN 27, Potassium 3.9, Sodium 139, EGFR 43-50  Recommendations: No changes.  Encouraged to call for fluid symptoms.  Follow-up plan: ICM clinic phone appointment on 05/03/2017 since he has an office appointment scheduled 03/23/2017 with Dr. Lovena Le.  Copy of ICM check sent to device physician.   3 month ICM trend: 02/22/2017   1 Year ICM trend:      [name], RN 02/22/2017 2:59 PM

## 2017-02-23 NOTE — Progress Notes (Signed)
Remote ICD transmission.   

## 2017-02-24 LAB — CUP PACEART REMOTE DEVICE CHECK
Battery Voltage: 2.62 V
Brady Statistic AS VS Percent: 75.41 %
HIGH POWER IMPEDANCE MEASURED VALUE: 69 Ohm
HighPow Impedance: 342 Ohm
Implantable Lead Implant Date: 20130110
Implantable Lead Implant Date: 20130110
Implantable Lead Location: 753859
Implantable Lead Model: 5076
Implantable Pulse Generator Implant Date: 20130110
Lead Channel Impedance Value: 399 Ohm
Lead Channel Impedance Value: 399 Ohm
Lead Channel Impedance Value: 513 Ohm
Lead Channel Pacing Threshold Amplitude: 0.875 V
Lead Channel Pacing Threshold Amplitude: 3.5 V
Lead Channel Pacing Threshold Pulse Width: 0.4 ms
Lead Channel Pacing Threshold Pulse Width: 1.2 ms
Lead Channel Sensing Intrinsic Amplitude: 1.25 mV
Lead Channel Sensing Intrinsic Amplitude: 3.625 mV
Lead Channel Sensing Intrinsic Amplitude: 3.625 mV
Lead Channel Setting Pacing Amplitude: 2 V
Lead Channel Setting Pacing Pulse Width: 0.4 ms
MDC IDC LEAD IMPLANT DT: 20130110
MDC IDC LEAD LOCATION: 753858
MDC IDC LEAD LOCATION: 753860
MDC IDC MSMT LEADCHNL LV IMPEDANCE VALUE: 456 Ohm
MDC IDC MSMT LEADCHNL LV IMPEDANCE VALUE: 836 Ohm
MDC IDC MSMT LEADCHNL RA PACING THRESHOLD AMPLITUDE: 0.625 V
MDC IDC MSMT LEADCHNL RA SENSING INTR AMPL: 0.625 mV
MDC IDC MSMT LEADCHNL RV PACING THRESHOLD PULSEWIDTH: 0.4 ms
MDC IDC SESS DTM: 20180731162122
MDC IDC SET LEADCHNL RV PACING AMPLITUDE: 2.5 V
MDC IDC SET LEADCHNL RV SENSING SENSITIVITY: 0.3 mV
MDC IDC STAT BRADY AP VP PERCENT: 0.03 %
MDC IDC STAT BRADY AP VS PERCENT: 0 %
MDC IDC STAT BRADY AS VP PERCENT: 24.55 %
MDC IDC STAT BRADY RA PERCENT PACED: 0.02 %
MDC IDC STAT BRADY RV PERCENT PACED: 13.12 %

## 2017-02-24 IMAGING — DX DG CHEST 2V
2 series · 2 of 2 positions shown · non-contrast
Comparison: 08/22/2015

CLINICAL DATA: Tachycardia, shortness of breath, left chest pain

EXAM:
CHEST  2 VIEW

[chest pa]
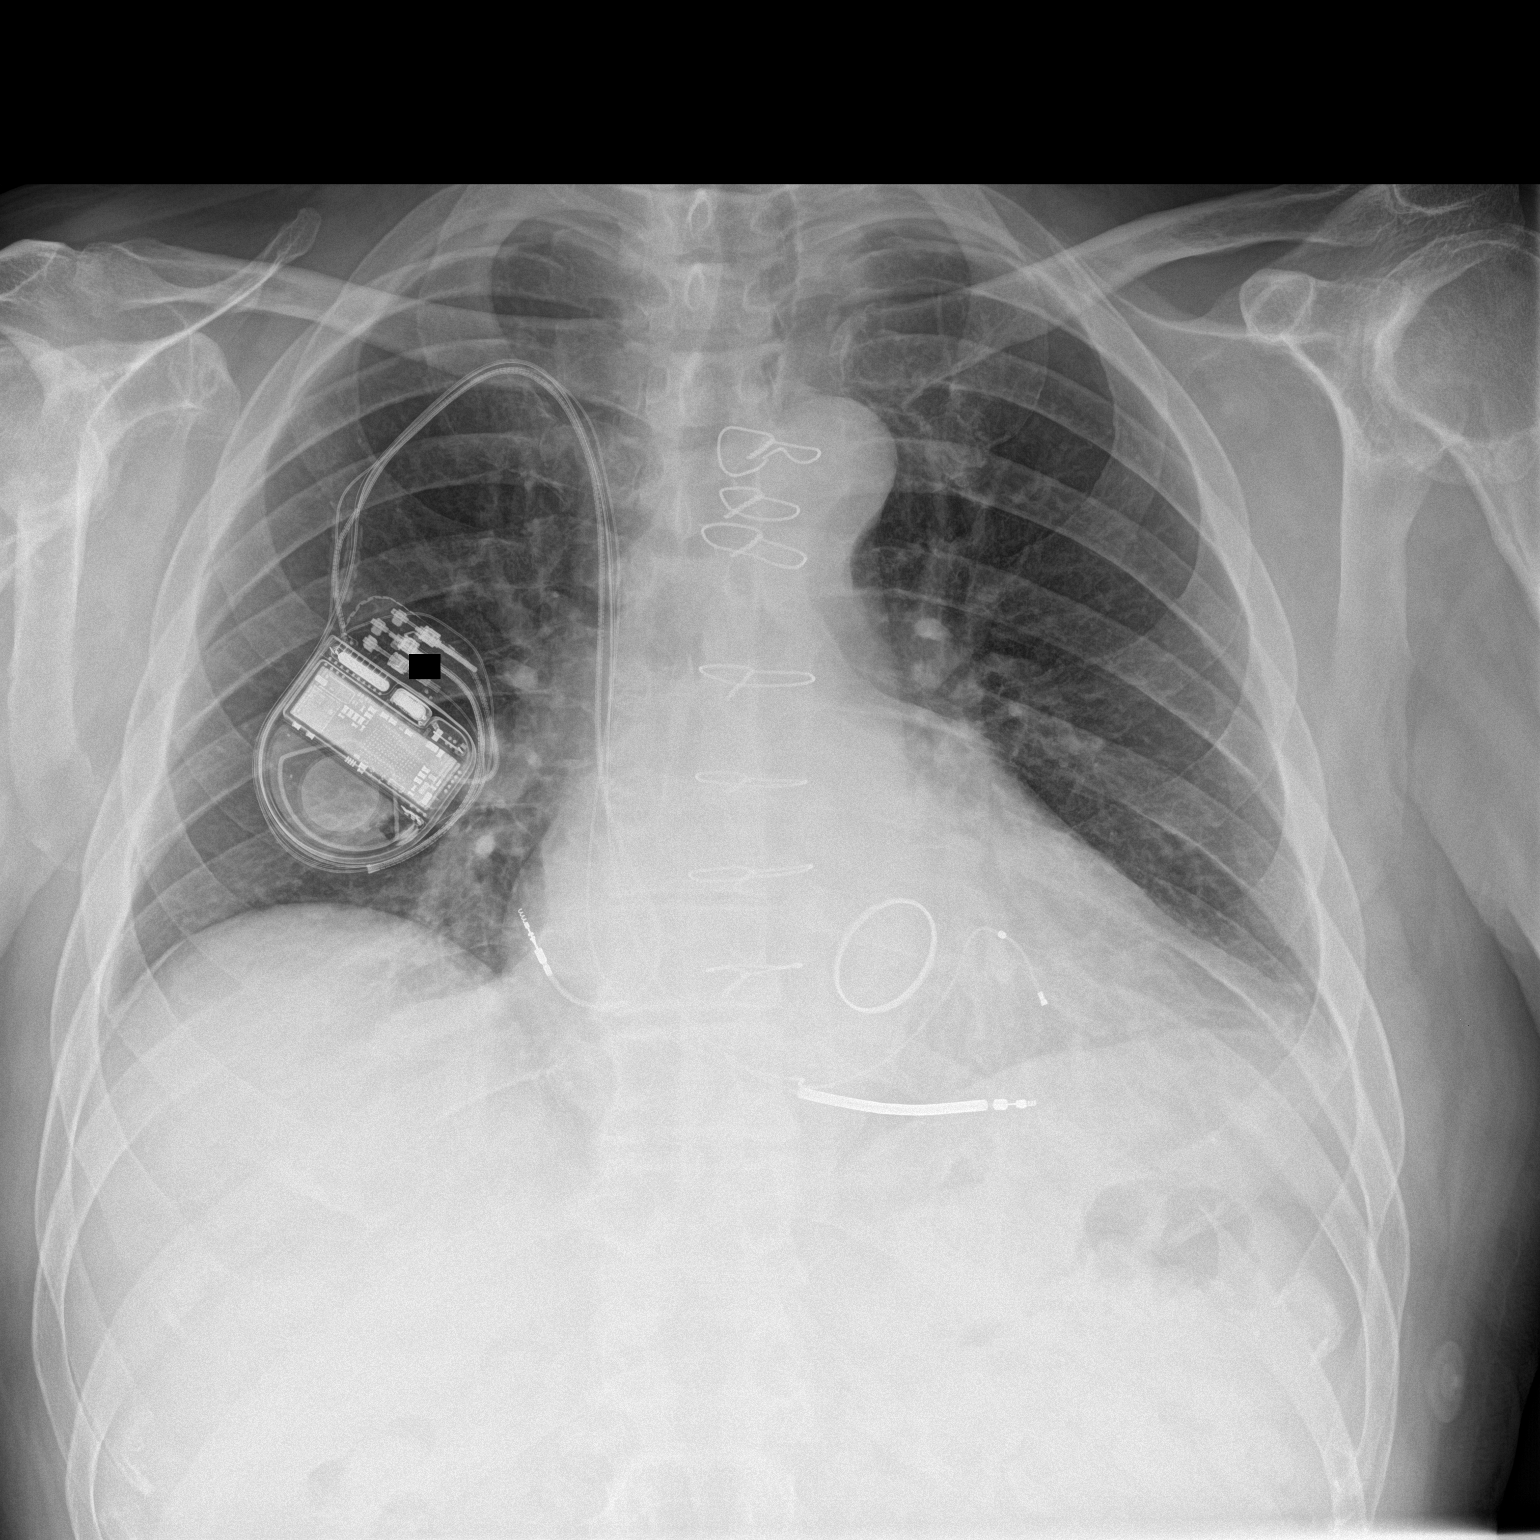

[chest lat]
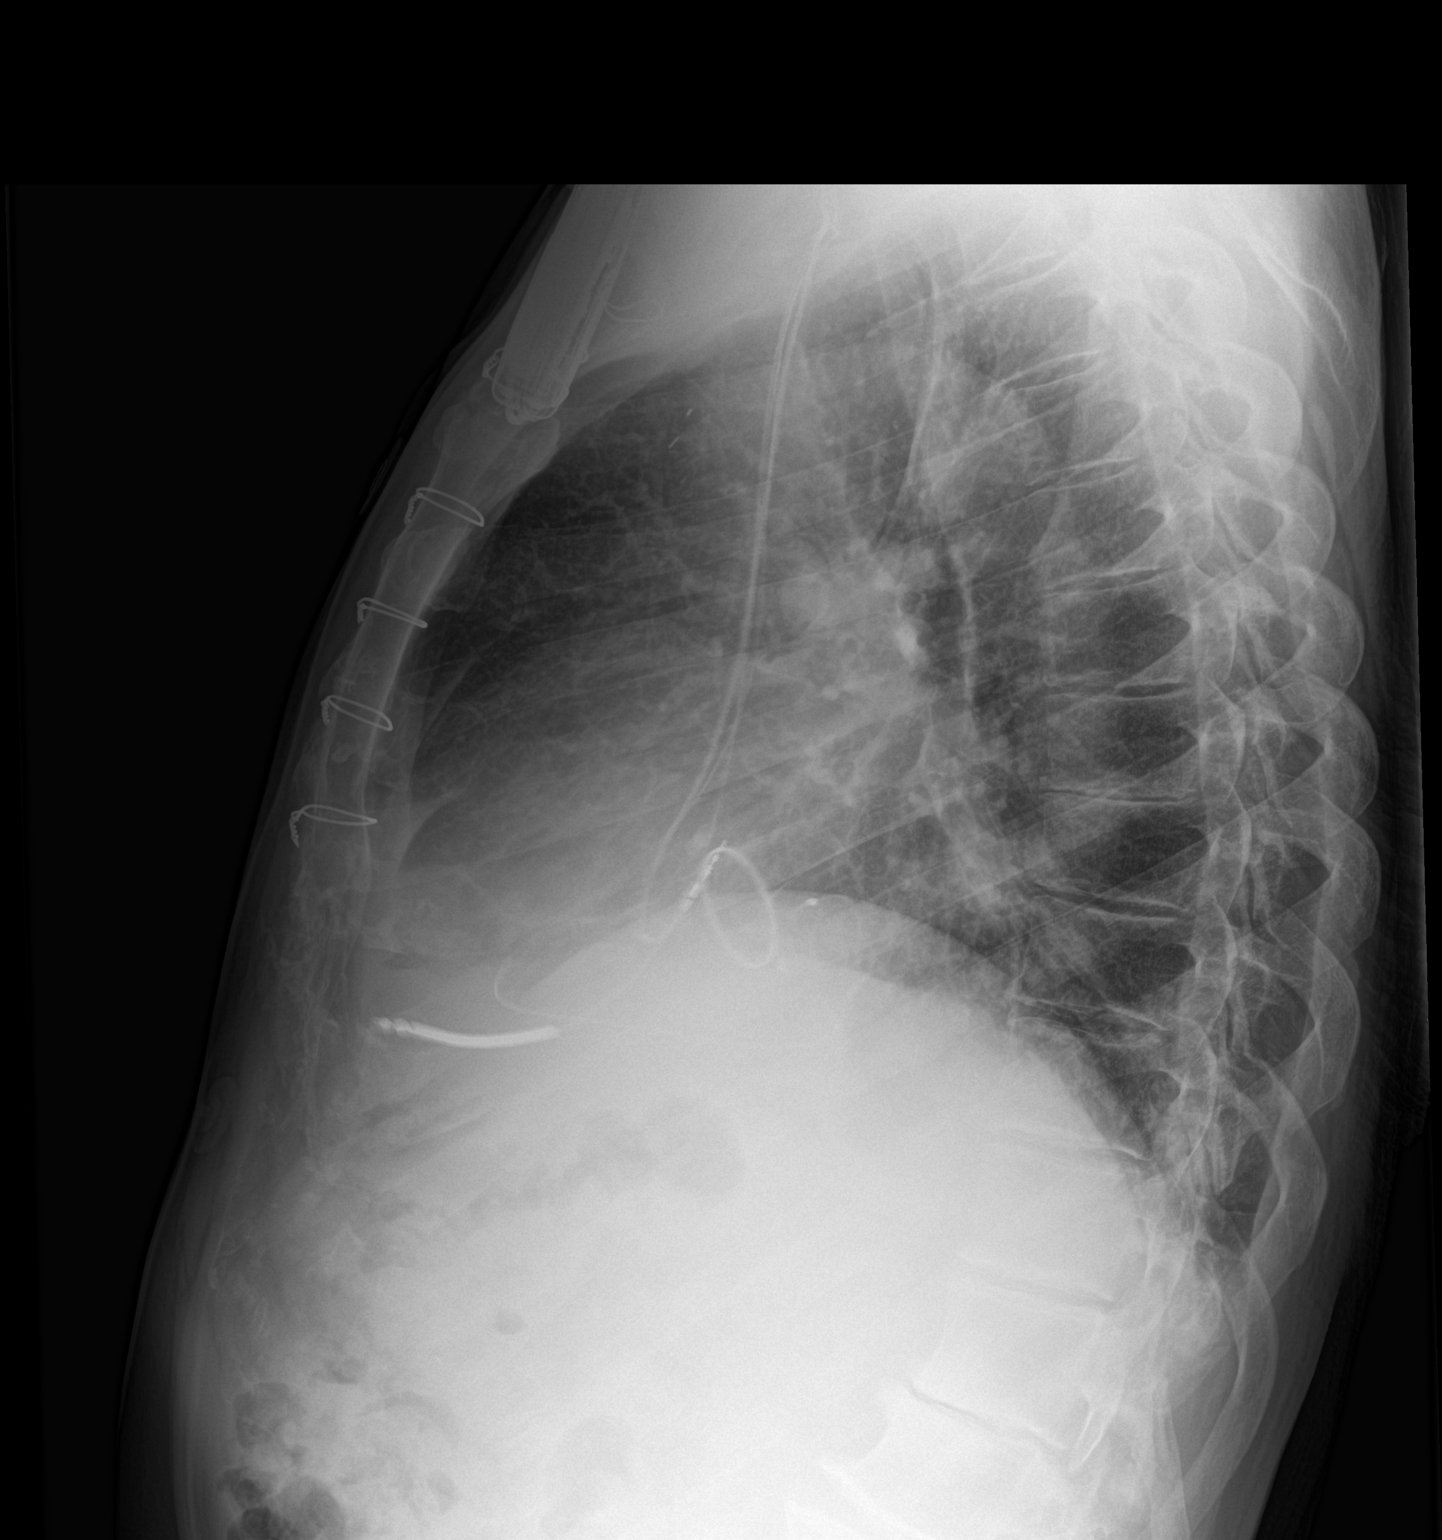

[2 of 2 positions shown; findings below may reference images not displayed]

FINDINGS: Lungs are clear.  No pleural effusion or pneumothorax.

Heart is top-normal in size. Prostatic valve. Right subclavian
pacemaker.

Mild degenerative changes of the visualized thoracolumbar spine.
Median sternotomy.
IMPRESSION: No evidence of acute cardiopulmonary disease.

## 2017-02-25 ENCOUNTER — Encounter: Payer: Self-pay | Admitting: Cardiology

## 2017-03-04 ENCOUNTER — Ambulatory Visit (INDEPENDENT_AMBULATORY_CARE_PROVIDER_SITE_OTHER): Payer: Medicare Other | Admitting: Internal Medicine

## 2017-03-04 DIAGNOSIS — Z5181 Encounter for therapeutic drug level monitoring: Secondary | ICD-10-CM

## 2017-03-04 LAB — POCT INR: INR: 3.3

## 2017-03-15 ENCOUNTER — Other Ambulatory Visit: Payer: Self-pay | Admitting: Interventional Cardiology

## 2017-03-17 ENCOUNTER — Ambulatory Visit (INDEPENDENT_AMBULATORY_CARE_PROVIDER_SITE_OTHER): Payer: Medicare Other | Admitting: Internal Medicine

## 2017-03-17 DIAGNOSIS — Z5181 Encounter for therapeutic drug level monitoring: Secondary | ICD-10-CM

## 2017-03-17 LAB — POCT INR: INR: 3.6

## 2017-03-18 ENCOUNTER — Encounter: Payer: Self-pay | Admitting: Pharmacist

## 2017-03-21 ENCOUNTER — Encounter: Payer: Self-pay | Admitting: Cardiology

## 2017-03-23 ENCOUNTER — Ambulatory Visit (INDEPENDENT_AMBULATORY_CARE_PROVIDER_SITE_OTHER): Payer: Medicare Other | Admitting: Internal Medicine

## 2017-03-23 ENCOUNTER — Encounter: Payer: Self-pay | Admitting: Internal Medicine

## 2017-03-23 DIAGNOSIS — I480 Paroxysmal atrial fibrillation: Secondary | ICD-10-CM | POA: Diagnosis not present

## 2017-03-23 DIAGNOSIS — I5022 Chronic systolic (congestive) heart failure: Secondary | ICD-10-CM

## 2017-03-23 LAB — CUP PACEART INCLINIC DEVICE CHECK
Brady Statistic AP VP Percent: 0.04 %
Brady Statistic AP VS Percent: 0.01 %
Brady Statistic AS VP Percent: 25.76 %
Brady Statistic AS VS Percent: 74.19 %
Brady Statistic RV Percent Paced: 13.86 %
Date Time Interrogation Session: 20180829134018
HIGH POWER IMPEDANCE MEASURED VALUE: 342 Ohm
HighPow Impedance: 74 Ohm
Implantable Lead Location: 753858
Implantable Lead Location: 753860
Implantable Lead Model: 5076
Implantable Lead Model: 6935
Implantable Pulse Generator Implant Date: 20130110
Lead Channel Impedance Value: 418 Ohm
Lead Channel Impedance Value: 475 Ohm
Lead Channel Impedance Value: 532 Ohm
Lead Channel Impedance Value: 836 Ohm
Lead Channel Sensing Intrinsic Amplitude: 0.625 mV
Lead Channel Sensing Intrinsic Amplitude: 1.125 mV
Lead Channel Sensing Intrinsic Amplitude: 3.875 mV
Lead Channel Sensing Intrinsic Amplitude: 5.375 mV
Lead Channel Setting Pacing Amplitude: 2.5 V
MDC IDC LEAD IMPLANT DT: 20130110
MDC IDC LEAD IMPLANT DT: 20130110
MDC IDC LEAD IMPLANT DT: 20130110
MDC IDC LEAD LOCATION: 753859
MDC IDC MSMT BATTERY VOLTAGE: 2.61 V
MDC IDC MSMT LEADCHNL LV PACING THRESHOLD AMPLITUDE: 3.5 V
MDC IDC MSMT LEADCHNL LV PACING THRESHOLD PULSEWIDTH: 1.2 ms
MDC IDC MSMT LEADCHNL RV IMPEDANCE VALUE: 418 Ohm
MDC IDC MSMT LEADCHNL RV PACING THRESHOLD AMPLITUDE: 0.75 V
MDC IDC MSMT LEADCHNL RV PACING THRESHOLD PULSEWIDTH: 0.4 ms
MDC IDC SET LEADCHNL RA PACING AMPLITUDE: 2 V
MDC IDC SET LEADCHNL RV PACING PULSEWIDTH: 0.4 ms
MDC IDC SET LEADCHNL RV SENSING SENSITIVITY: 0.3 mV
MDC IDC STAT BRADY RA PERCENT PACED: 0.02 %

## 2017-03-23 MED ORDER — AMIODARONE HCL 200 MG PO TABS: ORAL_TABLET | ORAL | 3 refills | Status: DC

## 2017-03-23 NOTE — Progress Notes (Signed)
HPI [name] returns today for follow-up. 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 He underwent ventricular tachycardia ablation over a year ago, and since then has had no recurrent VT. We have reduced his dose of amiodarone gradually and he is improved. Over the last few days, he has had some increased fatigue, and this seems to correlate with increased ventricular pacing. No ICD shocks. He has not had palpitations. No peripheral edema. No PND and no orthopnea. No Known Allergies   Current Outpatient Prescriptions  Medication Sig Dispense Refill  . acetaminophen (TYLENOL) 500 MG tablet Take 1,000 mg by mouth every 6 (six) hours as needed (pain).    Marland Kitchen amoxicillin (AMOXIL) 500 MG capsule TAKE 4 CAPSULES BY MOUTH 1 HOUR BEFORE DENTAL VISIT  0  . folic acid (FOLVITE) 1 MG tablet Take 1 tablet (1 mg total) by mouth at bedtime. 90 tablet 3  . furosemide (LASIX) 20 MG tablet Take 1 tablet (20 mg total) by mouth daily as needed. For weight over 205 30 tablet 3  . levothyroxine (SYNTHROID, LEVOTHROID) 75 MCG tablet Take 75 mcg by mouth daily before breakfast.    . lisinopril (PRINIVIL,ZESTRIL) 10 MG tablet Take 10 mg by mouth at bedtime.   9  . Multiple Vitamin (MULTIVITAMIN WITH MINERALS) TABS tablet Take 1 tablet by mouth daily.    . Omega-3 Fatty Acids (FISH OIL) 1000 MG CAPS Take 1,000 mg by mouth at bedtime.     Marland Kitchen warfarin (COUMADIN) 5 MG tablet TAKE AS DIRECTED BY COUMADIN CLINIC 90 tablet 1  . amiodarone (PACERONE) 200 MG tablet Take 1/2 tablet (100 mg) by mouth as directed 90 tablet 3   No current facility-administered medications for this visit.      Past Medical History:  Diagnosis Date  . Atrial fibrillation ([place])   . 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 Conductive hearing loss   . Intraspinal abscess   . 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 Nonischemic cardiomyopathy ([place])   . Psychosexual dysfunction with inhibited sexual excitement   . S/P mitral valve replacement   . Syncope and collapse   . Unspecified sleep apnea    last sleep study 11/07  . Ventricular tachycardia ([place])     ROS:   All systems reviewed and negative except as noted in the HPI.   Past Surgical History:  Procedure Laterality Date  . CARDIAC CATHETERIZATION  04/24/2002  . CARDIAC VALVE REPLACEMENT    . CARDIOVERSION  03/09/2012   Procedure: CARDIOVERSION;  Surgeon: Evans Lance, MD;  Location: [facility];  Service: Cardiovascular;  Laterality: N/A;  . CARDIOVERSION N/A 11/22/2013   Procedure: CARDIOVERSION;  Surgeon: Sanda Klein, MD;  Location: [facility];  Service: Cardiovascular;  Laterality: N/A;  . dental implants    . ELECTROPHYSIOLOGIC STUDY N/A 09/15/2015   Procedure: V Tach Ablation;  Surgeon: Evans Lance, MD;  Location: [facility] CV LAB;  Service: Cardiovascular;  Laterality: N/A;  . Evacution of epidural lumbar epidural abscess  1999  . INSERT / REPLACE / REMOVE PACEMAKER  11/2008  . MITRAL VALVE REPLACEMENT     w #[facility]  . PERMANENT PACEMAKER INSERTION N/A 08/05/2011   Procedure: PERMANENT PACEMAKER INSERTION;  Surgeon: Evans Lance, MD;  Location: [facility] CATH LAB;  Service: Cardiovascular;  Laterality: N/A;  . THYROIDECTOMY    . TONSILLECTOMY    . VALVE REPLACEMENT  2000     Family History  Problem Relation Age of Onset  . Heart disease Mother   . Heart failure Mother   . Heart disease Father   . Heart failure Father      Social History   Social History  . Marital status: Married    Spouse name: N/A  . Number of children: N/A  . Years of education: N/A   Occupational History  . Not on file.   Social History Main Topics    . Smoking status: Never Smoker  . Smokeless tobacco: Never Used  . Alcohol use 0.0 oz/week     Comment: occasionally  . Drug use: No  . Sexual activity: Not on file   Other Topics Concern  . Not on file   Social History Narrative  . No narrative on file     BP 116/68   Pulse (!) 50   Ht 6' 1.5" (1.867 m)   Wt 201 lb 12.8 oz (91.5 kg)   BMI 26.26 kg/m   Physical Exam:  Well appearing 76 yo man, NAD HEENT: Unremarkable Neck:  7 cm JVD, no thyromegally Lymphatics:  No adenopathy Back:  No CVA tenderness Lungs:  Clear with no wheezes HEART:  Regular rate rhythm, grade 1/6 systolic murmurs, no rubs, no clicks, mechanical S1 Abd:  soft, positive bowel sounds, no organomegally, no rebound, no guarding Ext:  2 plus pulses, no edema, no cyanosis, no clubbing Skin:  No rashes no nodules Neuro:  CN II through XII intact, motor grossly intact   DEVICE  Normal device function.  See PaceArt for details.   Assess/Plan: 1. Ventricular tachycardia - he is currently taking 200 mg of amiodarone daily except 100 mg daily on Saturday and Sunday. We discussed reducing his dose. He would like to reduce the dose 200 mg a day and his wife who is a Marine scientist and very involved in his care would like not to do so as they are going to be on vacation in [place]. I've asked him to take half tablet or 100 mg daily 3 or 4 days a week and 200 mg daily 3 or 4 days a week. Hopefully his VT will remain quiet. 2. Heart block - he has conduction system disease and his underlying rhythm is atrial fibrillation. He does appear to be pacing more. Hopefully reduction of his amiodarone dose will help improve his AV conduction. 3. Atrial fibrillation - his ventricular rate is well controlled, and at times he is pacing in the ventricle. He will continue systemic anticoagulation. 4. ICD - his Medtronic dual-chamber biventricular ICD is approaching elective replacement. His LV pacing threshold is very high. Ideally, I his  bundle lead or another LV lead couldn't be placed. Unfortunately this would increase his risk for device infection. I have reviewed this with the patient and his wife and we will reflect on whether or not to simply change out his device or attempt to place his bundle lead at the time of generator change out.  [name], M.D.

## 2017-03-23 NOTE — Patient Instructions (Signed)
Medication Instructions:  Your physician has recommended you make the following change in your medication:  1.  Reduce amiodarone. 2.  Take 100 mg amiodarone (0.5 tablet) by mouth as directed.    Labwork: None ordered.  Testing/Procedures: None ordered.  Follow-Up: Your physician recommends that you schedule a follow-up appointment in: 3 months with Dr. Lovena Le.     Remote monitoring is used to monitor your ICD from home. This monitoring reduces the number of office visits required to check your device to one time per year. It allows [place] to keep an eye on the functioning of your device to ensure it is working properly. You are scheduled for a device check from home on 05/03/2017. You may send your transmission at any time that day. If you have a wireless device, the transmission will be sent automatically. After your physician reviews your transmission, you will receive a postcard with your next transmission date.    Any Other Special Instructions Will Be Listed Below (If Applicable).     If you need a refill on your cardiac medications before your next appointment, please call your pharmacy.

## 2017-04-01 ENCOUNTER — Ambulatory Visit (INDEPENDENT_AMBULATORY_CARE_PROVIDER_SITE_OTHER): Payer: Medicare Other | Admitting: Internal Medicine

## 2017-04-01 DIAGNOSIS — I080 Rheumatic disorders of both mitral and aortic valves: Secondary | ICD-10-CM | POA: Diagnosis not present

## 2017-04-01 DIAGNOSIS — Z5181 Encounter for therapeutic drug level monitoring: Secondary | ICD-10-CM

## 2017-04-01 DIAGNOSIS — Z952 Presence of prosthetic heart valve: Secondary | ICD-10-CM

## 2017-04-01 LAB — POCT INR: INR: 3

## 2017-04-04 ENCOUNTER — Telehealth: Payer: Self-pay | Admitting: *Deleted

## 2017-04-04 DIAGNOSIS — I472 Ventricular tachycardia, unspecified: Secondary | ICD-10-CM

## 2017-04-04 DIAGNOSIS — Z01812 Encounter for preprocedural laboratory examination: Secondary | ICD-10-CM

## 2017-04-04 NOTE — Telephone Encounter (Signed)
[employer] ICD alert received 04/04/17. ICD is RRT as of 04/04/17. Unsure of next steps from last office note. Follow up 06/24/17 will be too late to discuss generator replacement. Routing to Dr. Lovena Le and Myrtie Hawk, RN for recommendations.  LMOM advising him of alert. Call back with questions.

## 2017-04-05 NOTE — Telephone Encounter (Signed)
[place]. Patient has been contacted. GT

## 2017-04-11 NOTE — Telephone Encounter (Signed)
Left message on Pt VM per DPR.  Need to schedule Pt for gen change.  Left this nurse name and # for call back.

## 2017-04-14 ENCOUNTER — Ambulatory Visit (INDEPENDENT_AMBULATORY_CARE_PROVIDER_SITE_OTHER): Payer: Medicare Other | Admitting: Cardiovascular Disease

## 2017-04-14 DIAGNOSIS — Z952 Presence of prosthetic heart valve: Secondary | ICD-10-CM

## 2017-04-14 DIAGNOSIS — Z5181 Encounter for therapeutic drug level monitoring: Secondary | ICD-10-CM

## 2017-04-14 DIAGNOSIS — I480 Paroxysmal atrial fibrillation: Secondary | ICD-10-CM

## 2017-04-14 DIAGNOSIS — I080 Rheumatic disorders of both mitral and aortic valves: Secondary | ICD-10-CM

## 2017-04-14 LAB — POCT INR: INR: 4.5

## 2017-04-15 NOTE — Telephone Encounter (Signed)
Message received from Pt on this nurse VM.  Returned Pt call.  Went to VM.  Left message to return call.

## 2017-04-15 NOTE — Telephone Encounter (Signed)
Spoke with Pt.  Pt scheduled for Biv ICD gen change on 05/04/2017 @ 10:30 am.  Made appt for lab work on 05/02/2017 for pre procedure labs.  Notified Pt would create instruction letter for Pt to pick up on 05/02/2017 along with surgical scrub and scrub directions.  Pt indicates understanding.  Will discuss Pt warfarin with Dr. Lovena Le and determine how long he should hold pre procedure.  Notified Pt I would call him back.  Pt indicates understanding.  Will cont to monitor.

## 2017-04-25 NOTE — Telephone Encounter (Signed)
Clarified warfarin orders with Dr. Lovena Le.  Hold warfarin 2 days prior to procedure.  Created instruction letter and left at front desk with soap.   Call placed to Pt, gave verbal instruction related to warfarin.  Notified Pt that instruction letter and soap at front desk when Pt arrives for lab work 05/02/2017.   Pt asked about home monitoring PT/INR on morning of 05/04/2017.  Notified Pt that Dr. Lovena Le wants a lab PT/INR on day of procedure-so Pt does not need to do home test that day.  Pt indicates understanding.

## 2017-04-29 ENCOUNTER — Telehealth: Payer: Self-pay

## 2017-04-29 NOTE — Telephone Encounter (Signed)
Called pt was due for INR check on 04/28/17, pt states he was told by Dr Tanna Furry nurse Myrtie Hawk that he did not need to check his INR on 04/28/17 because he was going to have an INR checked on 05/04/17 prior to ICD placement.   Pt's INR was 4.5 on 04/14/17 at last check advised pt he should check INR prior to procedure day because if INR elevated on 05/04/17 they will not do the procedure.  Pt states nurse told him to hold his Coumadin 2 days prior to procedure and this would prevent it from being elevated on procedure day.  I advised pt we would recommend checking INR prior to procedure date, so that if INR elevated we can address prior to date of his procedure.  Advised pt once he resumes Coumadin he will need to check INR 1 week after procedure on 05/11/17.

## 2017-05-02 ENCOUNTER — Telehealth: Payer: Self-pay

## 2017-05-02 ENCOUNTER — Ambulatory Visit (INDEPENDENT_AMBULATORY_CARE_PROVIDER_SITE_OTHER): Payer: Medicare Other | Admitting: Internal Medicine

## 2017-05-02 ENCOUNTER — Other Ambulatory Visit: Payer: Medicare Other | Admitting: *Deleted

## 2017-05-02 DIAGNOSIS — Z5181 Encounter for therapeutic drug level monitoring: Secondary | ICD-10-CM | POA: Diagnosis not present

## 2017-05-02 DIAGNOSIS — I4891 Unspecified atrial fibrillation: Secondary | ICD-10-CM

## 2017-05-02 DIAGNOSIS — I472 Ventricular tachycardia, unspecified: Secondary | ICD-10-CM

## 2017-05-02 DIAGNOSIS — Z01812 Encounter for preprocedural laboratory examination: Secondary | ICD-10-CM

## 2017-05-02 LAB — CBC
Hematocrit: 40.3 % (ref 37.5–51.0)
Hemoglobin: 13.4 g/dL (ref 13.0–17.7)
MCH: 30 pg (ref 26.6–33.0)
MCHC: 33.3 g/dL (ref 31.5–35.7)
MCV: 90 fL (ref 79–97)
PLATELETS: 205 10*3/uL (ref 150–379)
RBC: 4.47 x10E6/uL (ref 4.14–5.80)
RDW: 14.1 % (ref 12.3–15.4)
WBC: 7.7 10*3/uL (ref 3.4–10.8)

## 2017-05-02 LAB — BASIC METABOLIC PANEL
BUN/Creatinine Ratio: 15 (ref 10–24)
BUN: 19 mg/dL (ref 8–27)
CALCIUM: 9 mg/dL (ref 8.6–10.2)
CHLORIDE: 102 mmol/L (ref 96–106)
CO2: 22 mmol/L (ref 20–29)
Creatinine, Ser: 1.27 mg/dL (ref 0.76–1.27)
GFR calc non Af Amer: 55 mL/min/{1.73_m2} — ABNORMAL LOW (ref >59)
GFR, EST AFRICAN AMERICAN: 64 mL/min/{1.73_m2} (ref >59)
Glucose: 87 mg/dL (ref 65–99)
POTASSIUM: 4.4 mmol/L (ref 3.5–5.2)
Sodium: 137 mmol/L (ref 134–144)

## 2017-05-02 LAB — PROTIME-INR
INR: 2.1 — ABNORMAL HIGH (ref 0.8–1.2)
PROTHROMBIN TIME: 20.8 s — AB (ref 9.1–12.0)

## 2017-05-02 LAB — POCT INR: INR: 2.2

## 2017-05-02 NOTE — Telephone Encounter (Signed)
Call received from Pt.  Per Pt he had INR check today with INR 2.2.  Per pharmacist, recommend he take warfarin tonight and hold tomorrow night prior to procedure.  Spoke with APP, agree with pharmacist.  Call placed to Pt, notified to take warfarin tonight and hold tomorrow night prior to procedure.  Pt indicates understanding, thanked nurse for follow up.

## 2017-05-03 ENCOUNTER — Ambulatory Visit (INDEPENDENT_AMBULATORY_CARE_PROVIDER_SITE_OTHER): Payer: Medicare Other

## 2017-05-03 DIAGNOSIS — I5022 Chronic systolic (congestive) heart failure: Secondary | ICD-10-CM | POA: Diagnosis not present

## 2017-05-03 DIAGNOSIS — Z95810 Presence of automatic (implantable) cardiac defibrillator: Secondary | ICD-10-CM

## 2017-05-03 NOTE — Progress Notes (Signed)
EPIC Encounter for ICM Monitoring  Patient Name: Austin [name] is a 76 y.o. male Date: 05/03/2017 Primary Care Physican: Lajean Manes, MD Primary Cardiologist:Smith Electrophysiologist: Lovena Le Dry Weight:201 lbs  Since 03-May-2017 Time in AT/AF (100.0%) Longest AT/AF 14 months      Heart Failure questions reviewed, pt asymptomatic today.  Patient having generator change on 05/04/2017   Thoracic impedance normal.  Patient took Furosemide about 3 days ago due to small weight gain.   Prescribed dosage: Furosemide 20 mg 1 tablet as needed.     Labs: 10/28/2015 Creatinine 1.55, BUN 27, Potassium 3.9, Sodium 139, EGFR 43-50  Recommendations:  Explained new device will take approximately 6 weeks to develop it's baseline and next transmission will be 11/27.  Advised to call for fluid symptoms.  Follow-up plan: ICM clinic phone appointment on 06/21/2017.  Office appointment scheduled 05/16/2017 with device clinic for wound check.  Copy of ICM check sent to Dr. Lovena Le.   3 month ICM trend: 05/03/2017   1 Year ICM trend:      [name], RN 05/03/2017 9:47 AM

## 2017-05-04 ENCOUNTER — Encounter (HOSPITAL_COMMUNITY): Payer: Self-pay | Admitting: Internal Medicine

## 2017-05-04 ENCOUNTER — Encounter (HOSPITAL_COMMUNITY)
Admission: RE | Disposition: A | Discharge status: Home / Self Care | Payer: Self-pay | Source: Ambulatory Visit | Attending: Internal Medicine

## 2017-05-04 ENCOUNTER — Ambulatory Visit (HOSPITAL_COMMUNITY)
Admission: RE | Admit: 2017-05-04 | Discharge: 2017-05-04 | Disposition: A | Discharge status: Home / Self Care | Payer: Medicare Other | Source: Ambulatory Visit | Attending: Internal Medicine | Admitting: Internal Medicine

## 2017-05-04 DIAGNOSIS — I5022 Chronic systolic (congestive) heart failure: Secondary | ICD-10-CM

## 2017-05-04 DIAGNOSIS — Z952 Presence of prosthetic heart valve: Secondary | ICD-10-CM | POA: Diagnosis not present

## 2017-05-04 DIAGNOSIS — I442 Atrioventricular block, complete: Secondary | ICD-10-CM | POA: Insufficient documentation

## 2017-05-04 DIAGNOSIS — I481 Persistent atrial fibrillation: Secondary | ICD-10-CM | POA: Insufficient documentation

## 2017-05-04 DIAGNOSIS — Z7901 Long term (current) use of anticoagulants: Secondary | ICD-10-CM | POA: Insufficient documentation

## 2017-05-04 DIAGNOSIS — I482 Chronic atrial fibrillation: Secondary | ICD-10-CM | POA: Diagnosis not present

## 2017-05-04 DIAGNOSIS — I472 Ventricular tachycardia: Secondary | ICD-10-CM

## 2017-05-04 DIAGNOSIS — I429 Cardiomyopathy, unspecified: Secondary | ICD-10-CM | POA: Diagnosis not present

## 2017-05-04 DIAGNOSIS — Z79899 Other long term (current) drug therapy: Secondary | ICD-10-CM | POA: Diagnosis not present

## 2017-05-04 DIAGNOSIS — I340 Nonrheumatic mitral (valve) insufficiency: Secondary | ICD-10-CM | POA: Diagnosis not present

## 2017-05-04 DIAGNOSIS — I5023 Acute on chronic systolic (congestive) heart failure: Secondary | ICD-10-CM | POA: Diagnosis present

## 2017-05-04 DIAGNOSIS — Z4502 Encounter for adjustment and management of automatic implantable cardiac defibrillator: Secondary | ICD-10-CM | POA: Diagnosis present

## 2017-05-04 HISTORY — PX: BIV ICD GENERATOR CHANGEOUT: EP1194

## 2017-05-04 LAB — SURGICAL PCR SCREEN
MRSA, PCR: NEGATIVE
STAPHYLOCOCCUS AUREUS: NEGATIVE

## 2017-05-04 LAB — PROTIME-INR
INR: 2.17
Prothrombin Time: 24 seconds — ABNORMAL HIGH (ref 11.4–15.2)

## 2017-05-04 SURGERY — BIV ICD GENERATOR CHANGEOUT

## 2017-05-04 MED ORDER — MUPIROCIN 2 % EX OINT
1.0000 "application " | TOPICAL_OINTMENT | Freq: Once | CUTANEOUS | Status: AC
  Administered 2017-05-04: 1 via TOPICAL

## 2017-05-04 MED ORDER — SODIUM CHLORIDE 0.9 % IV SOLN
INTRAVENOUS | Status: DC
  Administered 2017-05-04: 09:00:00 via INTRAVENOUS

## 2017-05-04 MED ORDER — SODIUM CHLORIDE 0.9 % IR SOLN
80.0000 mg | Status: AC
  Administered 2017-05-04: 80 mg

## 2017-05-04 MED ORDER — CEFAZOLIN SODIUM-DEXTROSE 2-4 GM/100ML-% IV SOLN
2.0000 g | INTRAVENOUS | Status: AC
  Administered 2017-05-04: 2 g via INTRAVENOUS

## 2017-05-04 MED ORDER — BUPIVACAINE HCL (PF) 0.25 % IJ SOLN
INTRAMUSCULAR | Status: AC
  Filled 2017-05-04: qty 60

## 2017-05-04 MED ORDER — MUPIROCIN 2 % EX OINT
TOPICAL_OINTMENT | CUTANEOUS | Status: AC
  Administered 2017-05-04: 1 via TOPICAL
  Filled 2017-05-04: qty 22

## 2017-05-04 MED ORDER — GENTAMICIN SULFATE 40 MG/ML IJ SOLN
INTRAMUSCULAR | Status: AC
  Filled 2017-05-04: qty 2

## 2017-05-04 MED ORDER — FENTANYL CITRATE (PF) 100 MCG/2ML IJ SOLN
INTRAMUSCULAR | Status: DC | PRN
  Administered 2017-05-04: 25 ug via INTRAVENOUS

## 2017-05-04 MED ORDER — ACETAMINOPHEN 325 MG PO TABS: 325.0000 mg | ORAL_TABLET | ORAL | Status: DC | PRN

## 2017-05-04 MED ORDER — BUPIVACAINE HCL (PF) 0.25 % IJ SOLN
INTRAMUSCULAR | Status: DC | PRN
  Administered 2017-05-04: 45 mL

## 2017-05-04 MED ORDER — CHLORHEXIDINE GLUCONATE 4 % EX LIQD: 60.0000 mL | Freq: Once | CUTANEOUS | Status: DC

## 2017-05-04 MED ORDER — CEFAZOLIN SODIUM-DEXTROSE 2-4 GM/100ML-% IV SOLN
INTRAVENOUS | Status: AC
  Filled 2017-05-04: qty 100

## 2017-05-04 MED ORDER — MIDAZOLAM HCL 5 MG/5ML IJ SOLN
INTRAMUSCULAR | Status: AC
  Filled 2017-05-04: qty 5

## 2017-05-04 MED ORDER — MIDAZOLAM HCL 5 MG/5ML IJ SOLN
INTRAMUSCULAR | Status: DC | PRN
  Administered 2017-05-04 (×2): 1 mg via INTRAVENOUS

## 2017-05-04 MED ORDER — ONDANSETRON HCL 4 MG/2ML IJ SOLN: 4.0000 mg | Freq: Four times a day (QID) | INTRAMUSCULAR | Status: DC | PRN

## 2017-05-04 MED ORDER — FENTANYL CITRATE (PF) 100 MCG/2ML IJ SOLN
INTRAMUSCULAR | Status: AC
  Filled 2017-05-04: qty 2

## 2017-05-04 SURGICAL SUPPLY — 5 items
CABLE SURGICAL S-101-97-12 (CABLE) ×3 IMPLANT
ICD EVERA XT MRI DF1  DDMB1D1 (ICD Generator) ×2 IMPLANT
ICD EVERA XT MRI DF1 DDMB1D1 (ICD Generator) ×1 IMPLANT
PAD DEFIB LIFELINK (PAD) ×3 IMPLANT
TRAY PACEMAKER INSERTION (PACKS) ×3 IMPLANT

## 2017-05-04 NOTE — Interval H&P Note (Signed)
History and Physical Interval Note:  05/04/2017 8:47 AM  Austin [name]  has presented today for surgery, with the diagnosis of ERI  The various methods of treatment have been discussed with the patient and family. After consideration of risks, benefits and other options for treatment, the patient has consented to  Procedure(s): BiV ICD Generator Changeout (N/A) as a surgical intervention .  The patient's history has been reviewed, patient examined, no change in status, stable for surgery.  I have reviewed the patient's chart and labs.  Questions were answered to the patient's satisfaction.     [name]

## 2017-05-04 NOTE — Discharge Instructions (Signed)
Pacemaker Battery Change, Care After °This sheet gives you information about how to care for yourself after your procedure. Your health care provider may also give you more specific instructions. If you have problems or questions, contact your health care provider. °What can I expect after the procedure? °After your procedure, it is common to have: °· Pain or soreness at the site where the pacemaker was inserted. °· Swelling at the site where the pacemaker was inserted. ° °Follow these instructions at home: °Incision care °· Keep the incision clean and dry. °? Do not take baths, swim, or use a hot tub until your health care provider approves. °? You may shower the day after your procedure, or as directed by your health care provider. °? Pat the area dry with a clean towel. Do not rub the area. This may cause bleeding. °· Follow instructions from your health care provider about how to take care of your incision. Make sure you: °? Wash your hands with soap and water before you change your bandage (dressing). If soap and water are not available, use hand sanitizer. °? Change your dressing as told by your health care provider. °? Leave stitches (sutures), skin glue, or adhesive strips in place. These skin closures may need to stay in place for 2 weeks or longer. If adhesive strip edges start to loosen and curl up, you may trim the loose edges. Do not remove adhesive strips completely unless your health care provider tells you to do that. °· Check your incision area every day for signs of infection. Check for: °? More redness, swelling, or pain. °? More fluid or blood. °? Warmth. °? Pus or a bad smell. °Activity °· Do not lift anything that is heavier than 10 lb (4.5 kg) until your health care provider says it is okay to do so. °· For the first 2 weeks, or as long as told by your health care provider: °? Avoid lifting your left arm higher than your shoulder. °? Be gentle when you move your arms over your head. It is okay  to raise your arm to comb your hair. °? Avoid strenuous exercise. °· Ask your health care provider when it is okay to: °? Resume your normal activities. °? Return to work or school. °? Resume sexual activity. °Eating and drinking °· Eat a heart-healthy diet. This should include plenty of fresh fruits and vegetables, whole grains, low-fat dairy products, and lean protein like chicken and fish. °· Limit alcohol intake to no more than 1 drink a day for non-pregnant women and 2 drinks a day for men. One drink equals 12 oz of beer, 5 oz of wine, or 1½ oz of hard liquor. °· Check ingredients and nutrition facts on packaged foods and beverages. Avoid the following types of food: °? Food that is high in salt (sodium). °? Food that is high in saturated fat, like full-fat dairy or red meat. °? Food that is high in trans fat, like fried food. °? Food and drinks that are high in sugar. °Lifestyle °· Do not use any products that contain nicotine or tobacco, such as cigarettes and e-cigarettes. If you need help quitting, ask your health care provider. °· Take steps to manage and control your weight. °· Get regular exercise. Aim for 150 minutes of moderate-intensity exercise (such as walking or yoga) or 75 minutes of vigorous exercise (such as running or swimming) each week. °· Manage other health problems, such as diabetes or high blood pressure. Ask your health   care provider how you can manage these conditions. °General instructions °· Do not drive for 24 hours after your procedure if you were given a medicine to help you relax (sedative). °· Take over-the-counter and prescription medicines only as told by your health care provider. °· Avoid putting pressure on the area where the pacemaker was placed. °· If you need an MRI after your pacemaker has been placed, be sure to tell the health care provider who orders the MRI that you have a pacemaker. °· Avoid close and prolonged exposure to electrical devices that have strong  magnetic fields. These include: °? Cell phones. Avoid keeping them in a pocket near the pacemaker, and try using the ear opposite the pacemaker. °? MP3 players. °? Household appliances, like microwaves. °? Metal detectors. °? Electric generators. °? High-tension wires. °· Keep all follow-up visits as directed by your health care provider. This is important. °Contact a health care provider if: °· You have pain at the incision site that is not relieved by over-the-counter or prescription medicines. °· You have any of these around your incision site or coming from it: °? More redness, swelling, or pain. °? Fluid or blood. °? Warmth to the touch. °? Pus or a bad smell. °· You have a fever. °· You feel brief, occasional palpitations, light-headedness, or any symptoms that you think might be related to your heart. °Get help right away if: °· You experience chest pain that is different from the pain at the pacemaker site. °· You develop a red streak that extends above or below the incision site. °· You experience shortness of breath. °· You have palpitations or an irregular heartbeat. °· You have light-headedness that does not go away quickly. °· You faint or have dizzy spells. °· Your pulse suddenly drops or increases rapidly and does not return to normal. °· You begin to gain weight and your legs and ankles swell. °Summary °· After your procedure, it is common to have pain, soreness, and some swelling where the pacemaker was inserted. °· Make sure to keep your incision clean and dry. Follow instructions from your health care provider about how to take care of your incision. °· Check your incision every day for signs of infection, such as more pain or swelling, pus or a bad smell, warmth, or leaking fluid and blood. °· Avoid strenuous exercise and lifting your left arm higher than your shoulder for 2 weeks, or as long as told by your health care provider. °This information is not intended to replace advice given to you by  your health care provider. Make sure you discuss any questions you have with your health care provider. °Document Released: 05/02/2013 Document Revised: 06/03/2016 Document Reviewed: 06/03/2016 °Elsevier Interactive Patient Education © 2017 [employer] °Moderate Conscious Sedation, Adult, Care After °These instructions provide you with information about caring for yourself after your procedure. Your health care provider may also give you more specific instructions. Your treatment has been planned according to current medical practices, but problems sometimes occur. Call your health care provider if you have any problems or questions after your procedure. °What can I expect after the procedure? °After your procedure, it is common: °· To feel sleepy for several hours. °· To feel clumsy and have poor balance for several hours. °· To have poor judgment for several hours. °· To vomit if you eat too soon. ° °Follow these instructions at home: °For at least 24 hours after the procedure: ° °· Do not: °? Participate in   activities where you could fall or become injured. °? Drive. °? Use heavy machinery. °? Drink alcohol. °? Take sleeping pills or medicines that cause drowsiness. °? Make important decisions or sign legal documents. °? Take care of children on your own. °· Rest. °Eating and drinking °· Follow the diet recommended by your health care provider. °· If you vomit: °? Drink water, juice, or soup when you can drink without vomiting. °? Make sure you have little or no nausea before eating solid foods. °General instructions °· Have a responsible adult stay with you until you are awake and alert. °· Take over-the-counter and prescription medicines only as told by your health care provider. °· If you smoke, do not smoke without supervision. °· Keep all follow-up visits as told by your health care provider. This is important. °Contact a health care provider if: °· You keep feeling nauseous or you keep vomiting. °· You  feel light-headed. °· You develop a rash. °· You have a fever. °Get help right away if: °· You have trouble breathing. °This information is not intended to replace advice given to you by your health care provider. Make sure you discuss any questions you have with your health care provider. °Document Released: 05/02/2013 Document Revised: 12/15/2015 Document Reviewed: 11/01/2015 °Elsevier Interactive Patient Education © 2018 Elsevier Inc. ° °

## 2017-05-04 NOTE — H&P (Signed)
HPI [name] returns today for follow-up. 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 He underwent ventricular tachycardia ablation over a year ago, and since then has had no recurrent VT. We have reduced his dose of amiodarone gradually and he is improved. Over the last few days, he has had some increased fatigue, and this seems to correlate with increased ventricular pacing. No ICD shocks. He has not had palpitations. No peripheral edema. No PND and no orthopnea. No Known Allergies         Current Outpatient Prescriptions  Medication Sig Dispense Refill  . acetaminophen (TYLENOL) 500 MG tablet Take 1,000 mg by mouth every 6 (six) hours as needed (pain).    Marland Kitchen amoxicillin (AMOXIL) 500 MG capsule TAKE 4 CAPSULES BY MOUTH 1 HOUR BEFORE DENTAL VISIT  0  . folic acid (FOLVITE) 1 MG tablet Take 1 tablet (1 mg total) by mouth at bedtime. 90 tablet 3  . furosemide (LASIX) 20 MG tablet Take 1 tablet (20 mg total) by mouth daily as needed. For weight over 205 30 tablet 3  . levothyroxine (SYNTHROID, LEVOTHROID) 75 MCG tablet Take 75 mcg by mouth daily before breakfast.    . lisinopril (PRINIVIL,ZESTRIL) 10 MG tablet Take 10 mg by mouth at bedtime.   9  . Multiple Vitamin (MULTIVITAMIN WITH MINERALS) TABS tablet Take 1 tablet by mouth daily.    . Omega-3 Fatty Acids (FISH OIL) 1000 MG CAPS Take 1,000 mg by mouth at bedtime.     Marland Kitchen warfarin (COUMADIN) 5 MG tablet TAKE AS DIRECTED BY COUMADIN CLINIC 90 tablet 1  . amiodarone (PACERONE) 200 MG tablet Take 1/2 tablet (100 mg) by mouth as directed 90 tablet 3   No current facility-administered medications for this visit.          Past Medical History:  Diagnosis Date  . Atrial fibrillation ([place])   . Biventricular ICD (implantable cardiac  defibrillator) in place    cx by infection, explantation11/12 & reimplant 1/13  . Conductive hearing loss   . Intraspinal abscess   . 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 Nonischemic cardiomyopathy ([place])   . Psychosexual dysfunction with inhibited sexual excitement   . S/P mitral valve replacement   . Syncope and collapse   . Unspecified sleep apnea    last sleep study 11/07  . Ventricular tachycardia ([place])     ROS:   All systems reviewed and negative except as noted in the HPI.        Past Surgical History:  Procedure Laterality Date  . CARDIAC CATHETERIZATION  04/24/2002  . CARDIAC VALVE REPLACEMENT    . CARDIOVERSION  03/09/2012   Procedure: CARDIOVERSION;  Surgeon: Evans Lance, MD;  Location: [facility];  Service: Cardiovascular;  Laterality: N/A;  . CARDIOVERSION N/A 11/22/2013   Procedure: CARDIOVERSION;  Surgeon: Sanda Klein, MD;  Location: [facility];  Service: Cardiovascular;  Laterality: N/A;  . dental implants    . ELECTROPHYSIOLOGIC STUDY N/A 09/15/2015   Procedure: V Tach Ablation;  Surgeon: Evans Lance, MD;  Location: [facility];  Service: Cardiovascular;  Laterality: N/A;  . Evacution of epidural lumbar epidural abscess  1999  . INSERT / REPLACE / REMOVE PACEMAKER  11/2008  . MITRAL VALVE REPLACEMENT     w #  [address]  . PERMANENT PACEMAKER INSERTION N/A 08/05/2011   Procedure: PERMANENT PACEMAKER INSERTION;  Surgeon: Evans Lance, MD;  Location: [facility] CATH LAB;  Service: Cardiovascular;  Laterality: N/A;  . THYROIDECTOMY    . TONSILLECTOMY    . VALVE REPLACEMENT  2000          Family History  Problem Relation Age of Onset  . Heart disease Mother   . Heart failure Mother   . Heart disease Father   . Heart failure Father      Social History        Social History  . Marital status: Married    Spouse name: N/A  . Number of children:  N/A  . Years of education: N/A      Occupational History  . Not on file.         Social History Main Topics  . Smoking status: Never Smoker  . Smokeless tobacco: Never Used  . Alcohol use 0.0 oz/week     Comment: occasionally  . Drug use: No  . Sexual activity: Not on file       Other Topics Concern  . Not on file      Social History Narrative  . No narrative on file     BP 116/68   Pulse (!) 50   Ht 6' 1.5" (1.867 m)   Wt 201 lb 12.8 oz (91.5 kg)   BMI 26.26 kg/m   Physical Exam:  Well appearing 76 yo man, NAD HEENT: Unremarkable Neck:  7 cm JVD, no thyromegally Lymphatics:  No adenopathy Back:  No CVA tenderness Lungs:  Clear with no wheezes HEART:  Regular rate rhythm, grade 1/6 systolic murmurs, no rubs, no clicks, mechanical S1 Abd:  soft, positive bowel sounds, no organomegally, no rebound, no guarding Ext:  2 plus pulses, no edema, no cyanosis, no clubbing Skin:  No rashes no nodules Neuro:  CN II through XII intact, motor grossly intact   DEVICE  Normal device function.  See PaceArt for details.   Assess/Plan: 1. Ventricular tachycardia - he is currently taking 200 mg of amiodarone daily except 100 mg daily on Saturday and Sunday. We discussed reducing his dose. He would like to reduce the dose 200 mg a day and his wife who is a Marine scientist and very involved in his care would like not to do so as they are going to be on vacation in [place]. I've asked him to take half tablet or 100 mg daily 3 or 4 days a week and 200 mg daily 3 or 4 days a week. Hopefully his VT will remain quiet. 2. Heart block - he has conduction system disease and his underlying rhythm is atrial fibrillation. He does appear to be pacing more. Hopefully reduction of his amiodarone dose will help improve his AV conduction. 3. Atrial fibrillation - his ventricular rate is well controlled, and at times he is pacing in the ventricle. He will continue systemic anticoagulation. 4.  ICD - his Medtronic dual-chamber biventricular ICD is approaching elective replacement. His LV pacing threshold is very high. Ideally, I his bundle lead or another LV lead couldn't be placed. Unfortunately this would increase his risk for device infection. I have reviewed this with the patient and his wife and we will reflect on whether or not to simply change out his device or attempt to place his bundle lead at the time of generator change out.  Austin [name], M.D.  EP Attending  Patient seen  and examined. Agree with above. Since his last clinic visit, he has reached ERI on his device. I have recommended proceeding with ICD generator change out.   Austin [name].D.

## 2017-05-09 ENCOUNTER — Ambulatory Visit (INDEPENDENT_AMBULATORY_CARE_PROVIDER_SITE_OTHER): Payer: Medicare Other | Admitting: Cardiovascular Disease

## 2017-05-09 DIAGNOSIS — Z5181 Encounter for therapeutic drug level monitoring: Secondary | ICD-10-CM | POA: Diagnosis not present

## 2017-05-09 DIAGNOSIS — I059 Rheumatic mitral valve disease, unspecified: Secondary | ICD-10-CM

## 2017-05-09 LAB — POCT INR: INR: 2.6

## 2017-05-16 ENCOUNTER — Ambulatory Visit (INDEPENDENT_AMBULATORY_CARE_PROVIDER_SITE_OTHER): Payer: Medicare Other | Admitting: *Deleted

## 2017-05-16 DIAGNOSIS — I4891 Unspecified atrial fibrillation: Secondary | ICD-10-CM | POA: Diagnosis not present

## 2017-05-16 LAB — CUP PACEART INCLINIC DEVICE CHECK
Battery Voltage: 3.12 V
Brady Statistic AP VP Percent: 0 %
Brady Statistic AP VS Percent: 0 %
Brady Statistic AS VP Percent: 10.87 %
Brady Statistic AS VS Percent: 89.13 %
HighPow Impedance: 62 Ohm
Implantable Lead Implant Date: 20130110
Implantable Lead Location: 753859
Implantable Lead Model: 6935
Implantable Pulse Generator Implant Date: 20181010
Lead Channel Impedance Value: 342 Ohm
Lead Channel Impedance Value: 399 Ohm
Lead Channel Impedance Value: 399 Ohm
Lead Channel Pacing Threshold Amplitude: 0.75 V
Lead Channel Pacing Threshold Pulse Width: 0.4 ms
Lead Channel Sensing Intrinsic Amplitude: 3.875 mV
Lead Channel Setting Pacing Pulse Width: 0.4 ms
MDC IDC LEAD IMPLANT DT: 20130110
MDC IDC LEAD LOCATION: 753860
MDC IDC MSMT BATTERY REMAINING LONGEVITY: 131 mo
MDC IDC MSMT LEADCHNL RV SENSING INTR AMPL: 5.25 mV
MDC IDC SESS DTM: 20181022131137
MDC IDC SET LEADCHNL RV PACING AMPLITUDE: 2.5 V
MDC IDC SET LEADCHNL RV SENSING SENSITIVITY: 0.3 mV
MDC IDC STAT BRADY RA PERCENT PACED: 0 %
MDC IDC STAT BRADY RV PERCENT PACED: 12.61 %

## 2017-05-16 NOTE — Progress Notes (Signed)
Wound check appointment. Steri-strips removed. Wound without redness or edema. Incision edges approximated, wound well healed. Normal device function - VVI + coumadin. Threshold, sensing, and impedances consistent with implant measurements. Device programmed at chronic values s/p gen change. Histogram distribution appropriate for patient and level of activity. No ventricular arrhythmias noted. Patient educated about wound care, arm mobility, lifting restrictions, shock plan. ROV with GT 1/11

## 2017-05-18 ENCOUNTER — Ambulatory Visit (INDEPENDENT_AMBULATORY_CARE_PROVIDER_SITE_OTHER): Payer: Medicare Other | Admitting: Cardiology

## 2017-05-18 DIAGNOSIS — I472 Ventricular tachycardia: Secondary | ICD-10-CM

## 2017-05-18 DIAGNOSIS — Z952 Presence of prosthetic heart valve: Secondary | ICD-10-CM

## 2017-05-18 DIAGNOSIS — Z5181 Encounter for therapeutic drug level monitoring: Secondary | ICD-10-CM

## 2017-05-18 DIAGNOSIS — I4729 Other ventricular tachycardia: Secondary | ICD-10-CM

## 2017-05-18 LAB — POCT INR: INR: 2.7

## 2017-06-08 ENCOUNTER — Ambulatory Visit (INDEPENDENT_AMBULATORY_CARE_PROVIDER_SITE_OTHER): Payer: Medicare Other | Admitting: Cardiology

## 2017-06-08 DIAGNOSIS — Z952 Presence of prosthetic heart valve: Secondary | ICD-10-CM | POA: Diagnosis not present

## 2017-06-08 DIAGNOSIS — Z5181 Encounter for therapeutic drug level monitoring: Secondary | ICD-10-CM

## 2017-06-08 DIAGNOSIS — I080 Rheumatic disorders of both mitral and aortic valves: Secondary | ICD-10-CM | POA: Diagnosis not present

## 2017-06-08 LAB — POCT INR: INR: 4

## 2017-06-23 ENCOUNTER — Telehealth: Payer: Self-pay

## 2017-06-23 ENCOUNTER — Ambulatory Visit (INDEPENDENT_AMBULATORY_CARE_PROVIDER_SITE_OTHER): Payer: Medicare Other | Admitting: Cardiovascular Disease

## 2017-06-23 DIAGNOSIS — I059 Rheumatic mitral valve disease, unspecified: Secondary | ICD-10-CM | POA: Diagnosis not present

## 2017-06-23 DIAGNOSIS — Z5181 Encounter for therapeutic drug level monitoring: Secondary | ICD-10-CM

## 2017-06-23 LAB — POCT INR: INR: 2.8

## 2017-06-23 NOTE — Telephone Encounter (Signed)
ICM call to patient.  He had generator change on 05/04/2017 and doing well.  Advised it takes about 6-8 weeks for the fluid level baseline to develop and I will check it at the end of December.  Advised him to call for fluid symptoms if they occur before next transmission.  Advised a new monitor was ordered for him since he had a change in generator and should get in the next week.

## 2017-06-24 ENCOUNTER — Encounter: Payer: Medicare Other | Admitting: Internal Medicine

## 2017-07-06 ENCOUNTER — Other Ambulatory Visit: Payer: Self-pay | Admitting: *Deleted

## 2017-07-06 MED ORDER — AMOXICILLIN 500 MG PO CAPS: 2000.0000 mg | ORAL_CAPSULE | Freq: Once | ORAL | 0 refills | Status: AC

## 2017-07-06 NOTE — Telephone Encounter (Signed)
Patient called and stated that he requires an abx prior to his dental appointments. He has a dental appt scheduled in January and is requesting that this be refilled. I don't see where Dr Lovena Le has ever refilled this for him. Please advise. Thanks, MI

## 2017-07-07 ENCOUNTER — Ambulatory Visit (INDEPENDENT_AMBULATORY_CARE_PROVIDER_SITE_OTHER): Payer: Medicare Other | Admitting: Internal Medicine

## 2017-07-07 DIAGNOSIS — Z952 Presence of prosthetic heart valve: Secondary | ICD-10-CM | POA: Diagnosis not present

## 2017-07-07 DIAGNOSIS — Z5181 Encounter for therapeutic drug level monitoring: Secondary | ICD-10-CM

## 2017-07-07 DIAGNOSIS — I4891 Unspecified atrial fibrillation: Secondary | ICD-10-CM

## 2017-07-07 LAB — POCT INR: INR: 2.4

## 2017-07-15 ENCOUNTER — Telehealth: Payer: Self-pay | Admitting: Cardiology

## 2017-07-15 NOTE — Telephone Encounter (Signed)
LMOVM reminding pt to send remote transmission.   

## 2017-07-20 DIAGNOSIS — I428 Other cardiomyopathies: Secondary | ICD-10-CM | POA: Insufficient documentation

## 2017-07-22 ENCOUNTER — Ambulatory Visit (INDEPENDENT_AMBULATORY_CARE_PROVIDER_SITE_OTHER): Payer: Medicare Other | Admitting: Cardiovascular Disease

## 2017-07-22 DIAGNOSIS — Z5181 Encounter for therapeutic drug level monitoring: Secondary | ICD-10-CM

## 2017-07-22 LAB — POCT INR: INR: 4.1

## 2017-07-28 ENCOUNTER — Ambulatory Visit (INDEPENDENT_AMBULATORY_CARE_PROVIDER_SITE_OTHER): Payer: Medicare Other

## 2017-07-28 DIAGNOSIS — Z95810 Presence of automatic (implantable) cardiac defibrillator: Secondary | ICD-10-CM

## 2017-07-28 DIAGNOSIS — I5022 Chronic systolic (congestive) heart failure: Secondary | ICD-10-CM | POA: Diagnosis not present

## 2017-07-28 NOTE — Progress Notes (Signed)
EPIC Encounter for ICM Monitoring  Patient Name: Austin [name] is a 77 y.o. male Date: 07/28/2017 Primary Care Physican: Lajean Manes, MD Primary Cardiologist:Smith Electrophysiologist: Lovena Le Dry Weight:201 lbs      Heart Failure questions reviewed, pt asymptomatic.   Thoracic impedance at baseline normal today.   Prescribed dosage: Furosemide 20 mg 1 tablet as needed.     Labs: 10/28/2015 Creatinine 1.55, BUN 27, Potassium 3.9, Sodium 139, EGFR 43-50  Recommendations: No changes.  Encouraged to call for fluid symptoms.  Follow-up plan: ICM clinic phone appointment on 09/05/2017.  Office appointment scheduled 08/05/2017 with Dr. Lovena Le.  Copy of ICM check sent to Dr. Lovena Le.   3 month ICM trend: 07/25/2017    1 Year ICM trend:       [name], RN 07/28/2017 3:42 PM

## 2017-08-04 ENCOUNTER — Ambulatory Visit (INDEPENDENT_AMBULATORY_CARE_PROVIDER_SITE_OTHER): Payer: Medicare Other | Admitting: Cardiovascular Disease

## 2017-08-04 DIAGNOSIS — Z5181 Encounter for therapeutic drug level monitoring: Secondary | ICD-10-CM

## 2017-08-04 DIAGNOSIS — Z952 Presence of prosthetic heart valve: Secondary | ICD-10-CM

## 2017-08-04 LAB — POCT INR: INR: 3.2

## 2017-08-04 NOTE — Patient Instructions (Signed)
Description   Spoke with pt & instructed to  continue taking  5mg  daily except 2.5mg  on Tuesdays. Recheck INR in 2 weeks.  Amiodarone reduced to 100 mg daily on 03/23/2017

## 2017-08-05 ENCOUNTER — Ambulatory Visit: Payer: Medicare Other | Admitting: Internal Medicine

## 2017-08-05 ENCOUNTER — Encounter: Payer: Self-pay | Admitting: Internal Medicine

## 2017-08-05 VITALS — BP 110/84 | HR 44 | Ht 73.5 in | Wt 205.0 lb

## 2017-08-05 DIAGNOSIS — Z95810 Presence of automatic (implantable) cardiac defibrillator: Secondary | ICD-10-CM | POA: Diagnosis not present

## 2017-08-05 DIAGNOSIS — I480 Paroxysmal atrial fibrillation: Secondary | ICD-10-CM

## 2017-08-05 DIAGNOSIS — I472 Ventricular tachycardia, unspecified: Secondary | ICD-10-CM

## 2017-08-05 DIAGNOSIS — I5022 Chronic systolic (congestive) heart failure: Secondary | ICD-10-CM

## 2017-08-05 NOTE — Patient Instructions (Addendum)
Medication Instructions:  Your physician has recommended you make the following change in your medication:  1.  Stop taking your amiodarone when you get back from your trip to [place]. 2.  Stop taking your Synthroid on October 24, 2017.  Labwork: None ordered.  Testing/Procedures: None ordered.  Follow-Up: Your physician wants you to follow-up in early April with Dr. Lovena Le.    Remote monitoring is used to monitor your ICD from home. This monitoring reduces the number of office visits required to check your device to one time per year. It allows [place] to keep an eye on the functioning of your device to ensure it is working properly. You are scheduled for a device check from home on 09/05/2017. You may send your transmission at any time that day. If you have a wireless device, the transmission will be sent automatically. After your physician reviews your transmission, you will receive a postcard with your next transmission date.   Any Other Special Instructions Will Be Listed Below (If Applicable).  If you need a refill on your cardiac medications before your next appointment, please call your pharmacy.

## 2017-08-05 NOTE — Progress Notes (Signed)
HPI Austin [name] returns today for followup of VT, chronic systolic heart failure and atrial fib with a CVR. In the interim, he has  Had no recurrent chest pain or sob. He notes that he has had a couple of " bad days" but overall he is doing well with no chest pain or sob. He would like to stop his amio. He has not had any ventricular arrhythmias. No shocks. No edema. He wonders if he can stop the thyroid supplement.  No Known Allergies   Current Outpatient Medications  Medication Sig Dispense Refill  . acetaminophen (TYLENOL) 500 MG tablet Take 1,000 mg by mouth every 6 (six) hours as needed (pain).    Marland Kitchen amiodarone (PACERONE) 200 MG tablet Take 1/2 tablet (100 mg) by mouth as directed (Patient taking differently: Take 100 mg by mouth daily. Take 1/2 tablet (100 mg) by mouth as directed) 90 tablet 3  . folic acid (FOLVITE) 1 MG tablet Take 1 tablet (1 mg total) by mouth at bedtime. 90 tablet 3  . furosemide (LASIX) 20 MG tablet Take 1 tablet (20 mg total) by mouth daily as needed. For weight over 205 30 tablet 3  . levothyroxine (SYNTHROID, LEVOTHROID) 75 MCG tablet Take 75 mcg by mouth daily before breakfast.    . lisinopril (PRINIVIL,ZESTRIL) 10 MG tablet Take 10 mg by mouth at bedtime.   9  . Multiple Vitamin (MULTIVITAMIN WITH MINERALS) TABS tablet Take 1 tablet by mouth daily.    Marland Kitchen warfarin (COUMADIN) 5 MG tablet TAKE AS DIRECTED BY COUMADIN CLINIC 90 tablet 1   No current facility-administered medications for this visit.      Past Medical History:  Diagnosis Date  . Atrial fibrillation ([place])   . 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 Conductive hearing loss   . Intraspinal abscess   . 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 Nonischemic cardiomyopathy ([place])   . Psychosexual dysfunction with inhibited sexual excitement   . S/P mitral valve  replacement   . Syncope and collapse   . Unspecified sleep apnea    last sleep study 11/07  . Ventricular tachycardia ([place])     ROS:   All systems reviewed and negative except as noted in the HPI.   Past Surgical History:  Procedure Laterality Date  . BIV ICD GENERATOR CHANGEOUT N/A 05/04/2017   Procedure: BiV ICD Generator Changeout;  Surgeon: Evans Lance, MD;  Location: [facility];  Service: Cardiovascular;  Laterality: N/A;  . CARDIAC CATHETERIZATION  04/24/2002  . CARDIAC VALVE REPLACEMENT    . CARDIOVERSION  03/09/2012   Procedure: CARDIOVERSION;  Surgeon: Evans Lance, MD;  Location: [facility];  Service: Cardiovascular;  Laterality: N/A;  . CARDIOVERSION N/A 11/22/2013   Procedure: CARDIOVERSION;  Surgeon: Sanda Klein, MD;  Location: [facility];  Service: Cardiovascular;  Laterality: N/A;  . dental implants    . ELECTROPHYSIOLOGIC STUDY N/A 09/15/2015   Procedure: V Tach Ablation;  Surgeon: Evans Lance, MD;  Location: [facility];  Service: Cardiovascular;  Laterality: N/A;  . Evacution of epidural lumbar epidural abscess  1999  . INSERT / REPLACE / REMOVE PACEMAKER  11/2008  . MITRAL VALVE REPLACEMENT     w #[address]  . PERMANENT PACEMAKER INSERTION N/A 08/05/2011   Procedure: PERMANENT PACEMAKER INSERTION;  Surgeon: Evans Lance, MD;  Location: [facility] CATH LAB;  Service: Cardiovascular;  Laterality: N/A;  . THYROIDECTOMY    . TONSILLECTOMY    . VALVE REPLACEMENT  2000     Family History  Problem Relation Age of Onset  . Heart disease Mother   . Heart failure Mother   . Heart disease Father   . Heart failure Father      Social History   Socioeconomic History  . Marital status: Married    Spouse name: Not on file  . Number of children: Not on file  . Years of education: Not on file  . Highest education level: Not on file  Social Needs  . Financial resource strain: Not on file  . Food insecurity - worry: Not on file  . Food  insecurity - inability: Not on file  . Transportation needs - medical: Not on file  . Transportation needs - non-medical: Not on file  Occupational History  . Not on file  Tobacco Use  . Smoking status: Never Smoker  . Smokeless tobacco: Never Used  Substance and Sexual Activity  . Alcohol use: Yes    Alcohol/week: 0.0 oz    Comment: occasionally  . Drug use: No  . Sexual activity: Not on file  Other Topics Concern  . Not on file  Social History Narrative  . Not on file     BP 110/84   Pulse (!) 44   Ht 6' 1.5" (1.867 m)   Wt 205 lb (93 kg)   BMI 26.68 kg/m   Physical Exam:  Well appearing 77 yo man, NAD HEENT: Unremarkable Neck:  6 cm JVD, no thyromegally Lymphatics:  No adenopathy Back:  No CVA tenderness Lungs:  Clear with no wheezes HEART:  Regular rate rhythm, no murmurs, no rubs, no clicks Abd:  soft, positive bowel sounds, no organomegally, no rebound, no guarding Ext:  2 plus pulses, no edema, no cyanosis, no clubbing Skin:  No rashes no nodules Neuro:  CN II through XII intact, motor grossly intact  EKG - atrial fib with RBBB  DEVICE  Normal device function.  See PaceArt for details.   Assess/Plan: 1. Atrial fib - he is maintaining a controlled VR. 2. Chronic systolic heart failure - His symptoms are class 2. He will continue his current meds.  3. VT - he has had none since his ablation and I have weaned off the amio. He would like to stop it altogether. I have recommended he keep taking 100 mg daily until he gets back from his trip to [place]. 4. ICD - he has undergone ICD gen change and his device is working normally. Will followup in several months.   Mikle Bosworth.D.

## 2017-08-08 ENCOUNTER — Telehealth: Payer: Self-pay | Admitting: Internal Medicine

## 2017-08-08 DIAGNOSIS — Z9889 Other specified postprocedural states: Secondary | ICD-10-CM

## 2017-08-08 NOTE — Telephone Encounter (Signed)
New message ° °Pt verbalized that she is calling for RN °

## 2017-08-09 LAB — CUP PACEART INCLINIC DEVICE CHECK
Battery Remaining Longevity: 130 mo
Battery Voltage: 3.09 V
Brady Statistic AP VP Percent: 0 %
Brady Statistic AP VS Percent: 0 %
Brady Statistic AS VP Percent: 11.91 %
Brady Statistic AS VS Percent: 88.09 %
Brady Statistic RV Percent Paced: 12.98 %
HIGH POWER IMPEDANCE MEASURED VALUE: 69 Ohm
Implantable Lead Implant Date: 20130110
Implantable Lead Location: 753859
Implantable Lead Model: 6935
Implantable Pulse Generator Implant Date: 20181010
Lead Channel Impedance Value: 342 Ohm
Lead Channel Impedance Value: 399 Ohm
Lead Channel Pacing Threshold Pulse Width: 0.4 ms
Lead Channel Sensing Intrinsic Amplitude: 4 mV
Lead Channel Sensing Intrinsic Amplitude: 5.5 mV
Lead Channel Setting Pacing Amplitude: 2.5 V
Lead Channel Setting Pacing Pulse Width: 0.4 ms
MDC IDC LEAD IMPLANT DT: 20130110
MDC IDC LEAD LOCATION: 753860
MDC IDC MSMT LEADCHNL RV IMPEDANCE VALUE: 399 Ohm
MDC IDC MSMT LEADCHNL RV PACING THRESHOLD AMPLITUDE: 0.875 V
MDC IDC SESS DTM: 20190111171313
MDC IDC SET LEADCHNL RV SENSING SENSITIVITY: 0.3 mV
MDC IDC STAT BRADY RA PERCENT PACED: 0 %

## 2017-08-09 MED ORDER — AMOXICILLIN 500 MG PO TABS: 500.0000 mg | ORAL_TABLET | Freq: Two times a day (BID) | ORAL | 0 refills | Status: DC

## 2017-08-09 NOTE — Telephone Encounter (Signed)
Call returned to Pt.  Pt with infected tooth that will need to be removed September 05, 2017.  Pt has mechanical heart valve.  Spoke with Dr. Lovena Le, Dr. Lovena Le recommends Pt take Amoxicillin 500 mg bid until tooth removed.  Prescription sent to pharmacy. Spoke with coumadin clinic.  Per coumadin clinic Amoxicillin does not react with warfarin.  Pt does not need any more frequent monitoring.  Will notify Pt.  No further action at this time.

## 2017-08-18 ENCOUNTER — Ambulatory Visit (INDEPENDENT_AMBULATORY_CARE_PROVIDER_SITE_OTHER): Payer: Medicare Other | Admitting: Cardiovascular Disease

## 2017-08-18 DIAGNOSIS — Z952 Presence of prosthetic heart valve: Secondary | ICD-10-CM | POA: Diagnosis not present

## 2017-08-18 DIAGNOSIS — Z5181 Encounter for therapeutic drug level monitoring: Secondary | ICD-10-CM | POA: Diagnosis not present

## 2017-08-18 LAB — POCT INR: INR: 2.6

## 2017-08-18 NOTE — Patient Instructions (Signed)
Description   Spoke with pt & instructed to take Coumadin 1 and 1/2 tablets (7.5mg ) today Jan 24th then  continue taking  5mg  daily except 2.5mg  on Tuesdays. Recheck INR in 2 weeks.  Amiodarone reduced to 100 mg daily on 03/23/2017

## 2017-09-01 ENCOUNTER — Ambulatory Visit (INDEPENDENT_AMBULATORY_CARE_PROVIDER_SITE_OTHER): Payer: Medicare Other | Admitting: Internal Medicine

## 2017-09-01 DIAGNOSIS — Z5181 Encounter for therapeutic drug level monitoring: Secondary | ICD-10-CM

## 2017-09-01 DIAGNOSIS — Z952 Presence of prosthetic heart valve: Secondary | ICD-10-CM | POA: Diagnosis not present

## 2017-09-01 DIAGNOSIS — I4891 Unspecified atrial fibrillation: Secondary | ICD-10-CM

## 2017-09-01 LAB — POCT INR: INR: 3.1

## 2017-09-05 ENCOUNTER — Ambulatory Visit (INDEPENDENT_AMBULATORY_CARE_PROVIDER_SITE_OTHER): Payer: Medicare Other | Admitting: *Deleted

## 2017-09-05 DIAGNOSIS — I5022 Chronic systolic (congestive) heart failure: Secondary | ICD-10-CM

## 2017-09-05 DIAGNOSIS — I472 Ventricular tachycardia, unspecified: Secondary | ICD-10-CM

## 2017-09-05 DIAGNOSIS — Z95810 Presence of automatic (implantable) cardiac defibrillator: Secondary | ICD-10-CM

## 2017-09-06 NOTE — Progress Notes (Signed)
Remote ICD transmission.   

## 2017-09-06 NOTE — Progress Notes (Signed)
EPIC Encounter for ICM Monitoring  Patient Name: Austin [name] is a 77 y.o. male Date: 09/06/2017 Primary Care Physican: Lajean Manes, MD Primary Cardiologist:Smith Electrophysiologist: Lovena Le Dry Weight:201lbs       Heart Failure questions reviewed, pt asymptomatic .   Thoracic impedance normal.  Prescribed dosage: Furosemide 20 mg 1 tablet as needed.   Labs: 05/02/2017 Creatinine 1.27, BUN 19, Potassium 4.4, Sodium 137, EGFR 55-64 10/28/2015 Creatinine 1.55, BUN 27, Potassium 3.9, Sodium 139, EGFR 43-50  Recommendations: No changes.  Encouraged to call for fluid symptoms.  Follow-up plan: ICM clinic phone appointment on 10/07/2017.  Office appointment scheduled 10/25/2017 with Dr. Lovena Le.  Copy of ICM check sent to Dr. Lovena Le.   3 month ICM trend: 09/06/2017    1 Year ICM trend:       [name], RN 09/06/2017 3:13 PM

## 2017-09-07 ENCOUNTER — Encounter: Payer: Self-pay | Admitting: Cardiology

## 2017-09-13 LAB — CUP PACEART REMOTE DEVICE CHECK
Battery Remaining Longevity: 129 mo
Battery Voltage: 3.09 V
Brady Statistic AP VS Percent: 0 %
Date Time Interrogation Session: 20190211124225
HighPow Impedance: 75 Ohm
Implantable Lead Location: 753859
Implantable Pulse Generator Implant Date: 20181010
Lead Channel Impedance Value: 285 Ohm
Lead Channel Impedance Value: 399 Ohm
Lead Channel Pacing Threshold Pulse Width: 0.4 ms
Lead Channel Setting Pacing Amplitude: 2.5 V
Lead Channel Setting Sensing Sensitivity: 0.3 mV
MDC IDC LEAD IMPLANT DT: 20130110
MDC IDC LEAD IMPLANT DT: 20130110
MDC IDC LEAD LOCATION: 753860
MDC IDC MSMT LEADCHNL RV IMPEDANCE VALUE: 361 Ohm
MDC IDC MSMT LEADCHNL RV PACING THRESHOLD AMPLITUDE: 0.875 V
MDC IDC MSMT LEADCHNL RV SENSING INTR AMPL: 4.25 mV
MDC IDC MSMT LEADCHNL RV SENSING INTR AMPL: 4.25 mV
MDC IDC SET LEADCHNL RV PACING PULSEWIDTH: 0.4 ms
MDC IDC STAT BRADY AP VP PERCENT: 0 %
MDC IDC STAT BRADY AS VP PERCENT: 11.27 %
MDC IDC STAT BRADY AS VS PERCENT: 88.73 %
MDC IDC STAT BRADY RA PERCENT PACED: 0 %
MDC IDC STAT BRADY RV PERCENT PACED: 12.14 %

## 2017-09-15 ENCOUNTER — Ambulatory Visit (INDEPENDENT_AMBULATORY_CARE_PROVIDER_SITE_OTHER): Payer: Medicare Other | Admitting: Cardiology

## 2017-09-15 DIAGNOSIS — Z952 Presence of prosthetic heart valve: Secondary | ICD-10-CM

## 2017-09-15 DIAGNOSIS — Z5181 Encounter for therapeutic drug level monitoring: Secondary | ICD-10-CM

## 2017-09-15 DIAGNOSIS — I080 Rheumatic disorders of both mitral and aortic valves: Secondary | ICD-10-CM

## 2017-09-15 LAB — POCT INR: INR: 2.8

## 2017-09-15 NOTE — Patient Instructions (Signed)
Description   Spoke with pt & instructed to take 1 and 1/2 tablets (7.5mg ) today Feb 21st then change coumadin dose to   5mg  daily. Recheck INR in 2 weeks.  Amiodarone stopped on 08/31/17.

## 2017-09-20 ENCOUNTER — Other Ambulatory Visit: Payer: Self-pay | Admitting: Geriatric Medicine

## 2017-09-20 DIAGNOSIS — I2510 Atherosclerotic heart disease of native coronary artery without angina pectoris: Secondary | ICD-10-CM

## 2017-09-27 ENCOUNTER — Ambulatory Visit
Admission: RE | Admit: 2017-09-27 | Discharge: 2017-09-27 | Disposition: A | Discharge status: Home / Self Care | Payer: Medicare Other | Source: Ambulatory Visit | Attending: Geriatric Medicine | Admitting: Geriatric Medicine

## 2017-09-27 DIAGNOSIS — I2510 Atherosclerotic heart disease of native coronary artery without angina pectoris: Secondary | ICD-10-CM

## 2017-09-29 ENCOUNTER — Ambulatory Visit (INDEPENDENT_AMBULATORY_CARE_PROVIDER_SITE_OTHER): Payer: Medicare Other | Admitting: Internal Medicine

## 2017-09-29 DIAGNOSIS — I080 Rheumatic disorders of both mitral and aortic valves: Secondary | ICD-10-CM | POA: Diagnosis not present

## 2017-09-29 DIAGNOSIS — Z5181 Encounter for therapeutic drug level monitoring: Secondary | ICD-10-CM

## 2017-09-29 DIAGNOSIS — Z952 Presence of prosthetic heart valve: Secondary | ICD-10-CM

## 2017-09-29 LAB — POCT INR: INR: 4.3

## 2017-09-29 NOTE — Patient Instructions (Signed)
Description   Spoke with pt & instructed to hold coumadin today March 7th then continue same  coumadin dose   5mg  daily. Recheck INR in 1 week.  Amiodarone stopped on 08/31/17.

## 2017-10-06 ENCOUNTER — Ambulatory Visit (INDEPENDENT_AMBULATORY_CARE_PROVIDER_SITE_OTHER): Payer: Medicare Other | Admitting: Cardiology

## 2017-10-06 DIAGNOSIS — Z5181 Encounter for therapeutic drug level monitoring: Secondary | ICD-10-CM | POA: Diagnosis not present

## 2017-10-06 LAB — POCT INR: INR: 3.6

## 2017-10-07 ENCOUNTER — Ambulatory Visit (INDEPENDENT_AMBULATORY_CARE_PROVIDER_SITE_OTHER): Payer: Medicare Other

## 2017-10-07 DIAGNOSIS — I5022 Chronic systolic (congestive) heart failure: Secondary | ICD-10-CM

## 2017-10-07 DIAGNOSIS — Z95810 Presence of automatic (implantable) cardiac defibrillator: Secondary | ICD-10-CM | POA: Diagnosis not present

## 2017-10-07 NOTE — Progress Notes (Signed)
EPIC Encounter for ICM Monitoring  Patient Name: Austin [name] is a 77 y.o. male Date: 10/07/2017 Primary Care Physican: Lajean Manes, MD Primary Cardiologist:Smith Electrophysiologist: Lovena Le Dry Weight:201lbs       Heart Failure questions reviewed, pt asymptomatic.   Thoracic impedance normal.  Prescribed dosage: Furosemide 20 mg 1 tablet as needed.   Labs: 05/02/2017 Creatinine 1.27, BUN 19, Potassium 4.4, Sodium 137, EGFR 55-64 10/28/2015 Creatinine 1.55, BUN 27, Potassium 3.9, Sodium 139, EGFR 43-50  Recommendations: No changes.  Encouraged to call for fluid symptoms.  Follow-up plan: ICM clinic phone appointment on 12/05/2017 since he has office appointment scheduled 10/25/2017 with Dr. Lovena Le.  Copy of ICM check sent to Dr. Lovena Le.   3 month ICM trend: 10/07/2017    1 Year ICM trend:       [name], RN 10/07/2017 11:48 AM

## 2017-10-21 ENCOUNTER — Ambulatory Visit (INDEPENDENT_AMBULATORY_CARE_PROVIDER_SITE_OTHER): Payer: Medicare Other | Admitting: Internal Medicine

## 2017-10-21 DIAGNOSIS — Z5181 Encounter for therapeutic drug level monitoring: Secondary | ICD-10-CM | POA: Diagnosis not present

## 2017-10-21 LAB — POCT INR: INR: 3.8

## 2017-10-25 ENCOUNTER — Ambulatory Visit: Payer: Medicare Other | Admitting: Internal Medicine

## 2017-10-25 ENCOUNTER — Encounter: Payer: Self-pay | Admitting: *Deleted

## 2017-10-25 ENCOUNTER — Encounter: Payer: Self-pay | Admitting: Internal Medicine

## 2017-10-25 VITALS — BP 118/66 | HR 41 | Ht 73.5 in | Wt 203.0 lb

## 2017-10-25 DIAGNOSIS — Z95810 Presence of automatic (implantable) cardiac defibrillator: Secondary | ICD-10-CM | POA: Diagnosis not present

## 2017-10-25 DIAGNOSIS — I481 Persistent atrial fibrillation: Secondary | ICD-10-CM | POA: Diagnosis not present

## 2017-10-25 DIAGNOSIS — I472 Ventricular tachycardia, unspecified: Secondary | ICD-10-CM

## 2017-10-25 DIAGNOSIS — I5022 Chronic systolic (congestive) heart failure: Secondary | ICD-10-CM | POA: Diagnosis not present

## 2017-10-25 DIAGNOSIS — I4819 Other persistent atrial fibrillation: Secondary | ICD-10-CM

## 2017-10-25 LAB — CUP PACEART INCLINIC DEVICE CHECK
Battery Remaining Longevity: 128 mo
Brady Statistic AP VP Percent: 0 %
Brady Statistic AP VS Percent: 0 %
Brady Statistic AS VP Percent: 11.23 %
Brady Statistic AS VS Percent: 88.77 %
Date Time Interrogation Session: 20190402141714
HIGH POWER IMPEDANCE MEASURED VALUE: 79 Ohm
Implantable Lead Implant Date: 20130110
Implantable Lead Implant Date: 20130110
Implantable Lead Location: 753860
Lead Channel Pacing Threshold Pulse Width: 0.4 ms
Lead Channel Sensing Intrinsic Amplitude: 4.125 mV
Lead Channel Sensing Intrinsic Amplitude: 6.25 mV
Lead Channel Setting Pacing Amplitude: 2.5 V
Lead Channel Setting Pacing Pulse Width: 0.4 ms
MDC IDC LEAD LOCATION: 753859
MDC IDC MSMT BATTERY VOLTAGE: 3.03 V
MDC IDC MSMT LEADCHNL RA IMPEDANCE VALUE: 399 Ohm
MDC IDC MSMT LEADCHNL RV IMPEDANCE VALUE: 342 Ohm
MDC IDC MSMT LEADCHNL RV IMPEDANCE VALUE: 418 Ohm
MDC IDC MSMT LEADCHNL RV PACING THRESHOLD AMPLITUDE: 0.875 V
MDC IDC PG IMPLANT DT: 20181010
MDC IDC SET LEADCHNL RV SENSING SENSITIVITY: 0.3 mV
MDC IDC STAT BRADY RA PERCENT PACED: 0 %
MDC IDC STAT BRADY RV PERCENT PACED: 14.87 %

## 2017-10-25 NOTE — Patient Instructions (Addendum)
Medication Instructions:  Your physician recommends that you continue on your current medications as directed. Please refer to the Current Medication list given to you today. Austin [name]: Please get a TSH in one month-please schedule in early May.  Testing/Procedures: None ordered.  Follow-Up: Your physician wants you to follow-up in: 6 months with Dr. Lovena Le.     Remote monitoring is used to monitor your ICD from home. This monitoring reduces the number of office visits required to check your device to one time per year. It allows [place] to keep an eye on the functioning of your device to ensure it is working properly. You are scheduled for a device check from home on 12/05/2017. You may send your transmission at any time that day. If you have a wireless device, the transmission will be sent automatically. After your physician reviews your transmission, you will receive a postcard with your next transmission date.  Any Other Special Instructions Will Be Listed Below (If Applicable).  If you need a refill on your cardiac medications before your next appointment, please call your pharmacy.

## 2017-10-25 NOTE — Progress Notes (Addendum)
HPI Mr. Gin returns today for followup of VT, chronic systolic heart failure and atrial fib with a CVR. In the interim, he has  Had no recurrent chest pain or sob.  He is doing well with no chest pain or sob. He would like to stop his amio. He has not had any ventricular arrhythmias. No shocks. No edema. He just returned from a cruise. He is taking lasix as needed.  No Known Allergies   Current Outpatient Medications  Medication Sig Dispense Refill  . acetaminophen (TYLENOL) 500 MG tablet Take 1,000 mg by mouth every 6 (six) hours as needed (pain).    Marland Kitchen amiodarone (PACERONE) 200 MG tablet Take 1/2 tablet (100 mg) by mouth as directed (Patient taking differently: Take 100 mg by mouth daily. Take 1/2 tablet (100 mg) by mouth as directed) 90 tablet 3  . amoxicillin (AMOXIL) 500 MG tablet Take 1 tablet (500 mg total) by mouth 2 (two) times daily. 90 tablet 0  . folic acid (FOLVITE) 1 MG tablet Take 1 tablet (1 mg total) by mouth at bedtime. 90 tablet 3  . furosemide (LASIX) 20 MG tablet Take 1 tablet (20 mg total) by mouth daily as needed. For weight over 205 30 tablet 3  . levothyroxine (SYNTHROID, LEVOTHROID) 75 MCG tablet Take 75 mcg by mouth daily before breakfast.    . lisinopril (PRINIVIL,ZESTRIL) 10 MG tablet Take 10 mg by mouth at bedtime.   9  . Multiple Vitamin (MULTIVITAMIN WITH MINERALS) TABS tablet Take 1 tablet by mouth daily.    Marland Kitchen warfarin (COUMADIN) 5 MG tablet TAKE AS DIRECTED BY COUMADIN CLINIC 90 tablet 1   No current facility-administered medications for this visit.      Past Medical History:  Diagnosis Date  . Atrial fibrillation ([place])   . 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 Conductive hearing loss   . Intraspinal abscess   . 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 Nonischemic cardiomyopathy ([place])   . Psychosexual dysfunction  with inhibited sexual excitement   . S/P mitral valve replacement   . Syncope and collapse   . Unspecified sleep apnea    last sleep study 11/07  . Ventricular tachycardia ([place])     ROS:   All systems reviewed and negative except as noted in the HPI.   Past Surgical History:  Procedure Laterality Date  . BIV ICD GENERATOR CHANGEOUT N/A 05/04/2017   Procedure: BiV ICD Generator Changeout;  Surgeon: Evans Lance, MD;  Location: [place] [facility];  Service: Cardiovascular;  Laterality: N/A;  . CARDIAC CATHETERIZATION  04/24/2002  . CARDIAC VALVE REPLACEMENT    . CARDIOVERSION  03/09/2012   Procedure: CARDIOVERSION;  Surgeon: Evans Lance, MD;  Location: [place];  Service: Cardiovascular;  Laterality: N/A;  . CARDIOVERSION N/A 11/22/2013   Procedure: CARDIOVERSION;  Surgeon: Sanda Klein, MD;  Location: [facility];  Service: Cardiovascular;  Laterality: N/A;  . dental implants    . ELECTROPHYSIOLOGIC STUDY N/A 09/15/2015   Procedure: V Tach Ablation;  Surgeon: Evans Lance, MD;  Location: [place] [facility];  Service: Cardiovascular;  Laterality: N/A;  . Evacution of epidural lumbar epidural abscess  1999  . INSERT / REPLACE / REMOVE PACEMAKER  11/2008  . MITRAL VALVE REPLACEMENT     w [address] [facility]  . PERMANENT PACEMAKER INSERTION N/A 08/05/2011  Procedure: PERMANENT PACEMAKER INSERTION;  Surgeon: Evans Lance, MD;  Location: [facility] C[facility];  Service: Cardiovascular;  Laterality: N/A;  . THYROIDECTOMY    . TONSILLECTOMY    . VALVE REPLACEMENT  2000     Family History  Problem Relation Age of Onset  . Heart disease Mother   . Heart failure Mother   . Heart disease Father   . Heart failure Father      Social History   Socioeconomic History  . Marital status: Married    Spouse name: Not on file  . Number of children: Not on file  . Years of education: Not on file  . Highest education level: Not on file  Occupational History  . Not on file  Social Needs  .  Financial resource strain: Not on file  . Food insecurity:    Worry: Not on file    Inability: Not on file  . Transportation needs:    Medical: Not on file    Non-medical: Not on file  Tobacco Use  . Smoking status: Never Smoker  . Smokeless tobacco: Never Used  Substance and Sexual Activity  . Alcohol use: Yes    Alcohol/week: 0.0 oz    Comment: occasionally  . Drug use: No  . Sexual activity: Not on file  Lifestyle  . Physical activity:    Days per week: Not on file    Minutes per session: Not on file  . Stress: Not on file  Relationships  . Social connections:    Talks on phone: Not on file    Gets together: Not on file    Attends religious service: Not on file    Active member of club or organization: Not on file    Attends meetings of clubs or organizations: Not on file    Relationship status: Not on file  . Intimate partner violence:    Fear of current or ex partner: Not on file    Emotionally abused: Not on file    Physically abused: Not on file    Forced sexual activity: Not on file  Other Topics Concern  . Not on file  Social History Narrative  . Not on file     Ht 6' 1.5" (1.867 m)   BMI 26.68 kg/m   Physical Exam:  Well appearing 77 yo man, NAD HEENT: Unremarkable Neck:  6 cm JVD, no thyromegally Lymphatics:  No adenopathy Back:  No CVA tenderness Lungs:  Clear with no wheezes HEART:  Regular rate rhythm, no murmurs, no rubs, no clicks Abd:  soft, positive bowel sounds, no organomegally, no rebound, no guarding Ext:  2 plus pulses, no edema, no cyanosis, no clubbing Skin:  No rashes no nodules Neuro:  CN II through XII intact, motor grossly intact  EKG - atrial fib with intermittent ventricular pacing  DEVICE  Normal device function.  See PaceArt for details.   Assess/Plan: 1. VT - he has not had any VT since his last visit and he has been off of amio for several weeks. 2. Atrial fib - his ventricular rate is well controlled. 3. 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 He will recheck his TSH in a month. 4. Chronic systolic heart - his symptoms are class 2. He would like to be class 1. I encouraged him to continue his current meds and maintain a low sodium diet.  Mikle Bosworth.D.

## 2017-10-31 ENCOUNTER — Ambulatory Visit (INDEPENDENT_AMBULATORY_CARE_PROVIDER_SITE_OTHER): Payer: Medicare Other | Admitting: Internal Medicine

## 2017-10-31 DIAGNOSIS — Z5181 Encounter for therapeutic drug level monitoring: Secondary | ICD-10-CM

## 2017-10-31 LAB — POCT INR: INR: 2.9

## 2017-11-17 ENCOUNTER — Ambulatory Visit (INDEPENDENT_AMBULATORY_CARE_PROVIDER_SITE_OTHER): Payer: Medicare Other | Admitting: Pharmacist

## 2017-11-17 DIAGNOSIS — Z5181 Encounter for therapeutic drug level monitoring: Secondary | ICD-10-CM | POA: Diagnosis not present

## 2017-11-17 LAB — POCT INR: INR: 2.8

## 2017-11-30 ENCOUNTER — Other Ambulatory Visit: Payer: Medicare Other | Admitting: *Deleted

## 2017-11-30 ENCOUNTER — Ambulatory Visit (INDEPENDENT_AMBULATORY_CARE_PROVIDER_SITE_OTHER): Payer: Medicare Other | Admitting: Cardiovascular Disease

## 2017-11-30 DIAGNOSIS — Z952 Presence of prosthetic heart valve: Secondary | ICD-10-CM | POA: Diagnosis not present

## 2017-11-30 DIAGNOSIS — I080 Rheumatic disorders of both mitral and aortic valves: Secondary | ICD-10-CM

## 2017-11-30 DIAGNOSIS — I5022 Chronic systolic (congestive) heart failure: Secondary | ICD-10-CM

## 2017-11-30 DIAGNOSIS — Z95810 Presence of automatic (implantable) cardiac defibrillator: Secondary | ICD-10-CM

## 2017-11-30 DIAGNOSIS — Z5181 Encounter for therapeutic drug level monitoring: Secondary | ICD-10-CM | POA: Diagnosis not present

## 2017-11-30 DIAGNOSIS — I472 Ventricular tachycardia, unspecified: Secondary | ICD-10-CM

## 2017-11-30 DIAGNOSIS — I4819 Other persistent atrial fibrillation: Secondary | ICD-10-CM

## 2017-11-30 LAB — TSH: TSH: 7.53 u[IU]/mL — ABNORMAL HIGH (ref 0.450–4.500)

## 2017-11-30 LAB — POCT INR: INR: 3

## 2017-11-30 NOTE — Patient Instructions (Signed)
Description   Spoke with pt instructed to continue same  dose of coumadin   5mg  daily except 2.5mg  on Tuesdays.  Recheck INR in 2 weeks.  Amiodarone stopped on 08/31/17.

## 2017-12-01 ENCOUNTER — Other Ambulatory Visit: Payer: Self-pay

## 2017-12-01 MED ORDER — LEVOTHYROXINE SODIUM 50 MCG PO TABS: 50.0000 ug | ORAL_TABLET | Freq: Every day | ORAL | 3 refills | Status: DC

## 2017-12-05 ENCOUNTER — Ambulatory Visit (INDEPENDENT_AMBULATORY_CARE_PROVIDER_SITE_OTHER): Payer: Medicare Other | Admitting: *Deleted

## 2017-12-05 DIAGNOSIS — Z95810 Presence of automatic (implantable) cardiac defibrillator: Secondary | ICD-10-CM | POA: Diagnosis not present

## 2017-12-05 DIAGNOSIS — I5022 Chronic systolic (congestive) heart failure: Secondary | ICD-10-CM | POA: Diagnosis not present

## 2017-12-05 DIAGNOSIS — I472 Ventricular tachycardia, unspecified: Secondary | ICD-10-CM

## 2017-12-05 NOTE — Progress Notes (Signed)
EPIC Encounter for ICM Monitoring  Patient Name: Austin [name] is a 77 y.o. male Date: 12/05/2017 Primary Care Physican: Stoneking, Hal, MD Primary Cardiologist:Smith Electrophysiologist: Taylor Dry Weight:201.5lbs         Heart Failure questions reviewed, pt symptomatic with weight gain but weight is dropping. Highest weight was 204.5 lbs. He has not taken any Furosemide in the last couple of weeks and prefers to adjust diet today instead of taking the PRN dose. He reported    Thoracic impedance abnormal suggesting fluid accumulation since 11/26/2017.  Prescribed dosage: Furosemide 20 mg 1 tablet as needed.   Labs: 05/02/2017 Creatinine 1.27, BUN 19, Potassium 4.4, Sodium 137, EGFR 55-64 10/28/2015 Creatinine 1.55, BUN 27, Potassium 3.9, Sodium 139, EGFR 43-50  Recommendations:  Advised to use PRN Furosemide if needed.  Encouraged to call for fluid symptoms.  Follow-up plan: ICM clinic phone appointment on 12/13/2017 to recheck fluid levels.    Copy of ICM check sent to Dr. Smith and Dr. Taylor.   3 month ICM trend: 12/05/2017    1 Year ICM trend:        S , RN 12/05/2017 12:22 PM   

## 2017-12-06 NOTE — Progress Notes (Signed)
Remote ICD transmission.   

## 2017-12-07 ENCOUNTER — Encounter: Payer: Self-pay | Admitting: Cardiology

## 2017-12-07 ENCOUNTER — Other Ambulatory Visit: Payer: Self-pay

## 2017-12-07 LAB — CUP PACEART REMOTE DEVICE CHECK
Battery Remaining Longevity: 127 mo
Battery Voltage: 3.05 V
Brady Statistic AS VS Percent: 91.74 %
Brady Statistic RA Percent Paced: 0 %
Brady Statistic RV Percent Paced: 14.77 %
HighPow Impedance: 71 Ohm
Implantable Lead Implant Date: 20130110
Implantable Lead Location: 753859
Implantable Lead Location: 753860
Implantable Lead Model: 6935
Lead Channel Impedance Value: 342 Ohm
Lead Channel Pacing Threshold Pulse Width: 0.4 ms
Lead Channel Setting Pacing Amplitude: 2.5 V
Lead Channel Setting Pacing Pulse Width: 0.4 ms
MDC IDC LEAD IMPLANT DT: 20130110
MDC IDC MSMT LEADCHNL RA IMPEDANCE VALUE: 399 Ohm
MDC IDC MSMT LEADCHNL RV IMPEDANCE VALUE: 399 Ohm
MDC IDC MSMT LEADCHNL RV PACING THRESHOLD AMPLITUDE: 0.875 V
MDC IDC MSMT LEADCHNL RV SENSING INTR AMPL: 3.75 mV
MDC IDC MSMT LEADCHNL RV SENSING INTR AMPL: 3.75 mV
MDC IDC PG IMPLANT DT: 20181010
MDC IDC SESS DTM: 20190513063325
MDC IDC SET LEADCHNL RV SENSING SENSITIVITY: 0.3 mV
MDC IDC STAT BRADY AP VP PERCENT: 0 %
MDC IDC STAT BRADY AP VS PERCENT: 0 %
MDC IDC STAT BRADY AS VP PERCENT: 8.26 %

## 2017-12-07 MED ORDER — FUROSEMIDE 20 MG PO TABS: 20.0000 mg | ORAL_TABLET | Freq: Every day | ORAL | 3 refills | Status: DC | PRN

## 2017-12-13 ENCOUNTER — Ambulatory Visit (INDEPENDENT_AMBULATORY_CARE_PROVIDER_SITE_OTHER): Payer: Self-pay

## 2017-12-13 DIAGNOSIS — I5022 Chronic systolic (congestive) heart failure: Secondary | ICD-10-CM

## 2017-12-13 DIAGNOSIS — Z95810 Presence of automatic (implantable) cardiac defibrillator: Secondary | ICD-10-CM

## 2017-12-13 NOTE — Progress Notes (Signed)
EPIC Encounter for ICM Monitoring  Patient Name: Austin [name] is a 77 y.o. male Date: 12/13/2017 Primary Care Physican: Lajean Manes, MD Primary Cardiologist:Smith Electrophysiologist: Lovena Le Dry Weight:201.5lbs      Heart Failure questions reviewed, pt had weight gain and took PRN Furosemide.     Thoracic impedance returned to normal after taking PRN Furosemide.   Prescribed dosage: Furosemide 20 mg 1 tablet as needed.   Labs: 05/02/2017 Creatinine 1.27, BUN 19, Potassium 4.4, Sodium 137, EGFR 55-64 10/28/2015 Creatinine 1.55, BUN 27, Potassium 3.9, Sodium 139, EGFR 43-50  Recommendations: No changes.  Encouraged to call for fluid symptoms.  Follow-up plan: ICM clinic phone appointment on 01/09/2018. .  Copy of ICM check sent to Dr. Lovena Le.   3 month ICM trend: 12/13/2017    1 Year ICM trend:       [name], RN 12/13/2017 1:10 PM

## 2017-12-14 ENCOUNTER — Ambulatory Visit (INDEPENDENT_AMBULATORY_CARE_PROVIDER_SITE_OTHER): Payer: Medicare Other | Admitting: Internal Medicine

## 2017-12-14 DIAGNOSIS — Z952 Presence of prosthetic heart valve: Secondary | ICD-10-CM

## 2017-12-14 DIAGNOSIS — Z5181 Encounter for therapeutic drug level monitoring: Secondary | ICD-10-CM | POA: Diagnosis not present

## 2017-12-14 LAB — POCT INR: INR: 3.5 — AB (ref 2.0–3.0)

## 2017-12-14 NOTE — Patient Instructions (Signed)
Description   Spoke with pt's wife and  instructed to continue same  dose of coumadin   5mg  daily except 2.5mg  on Tuesdays.  Recheck INR in 2 weeks.  Amiodarone stopped on 08/31/17. Instructed to eat good serving of greens today and increase intake of greens by small amt as he has restarted Levothyroxine

## 2017-12-28 ENCOUNTER — Ambulatory Visit (INDEPENDENT_AMBULATORY_CARE_PROVIDER_SITE_OTHER): Payer: Medicare Other | Admitting: Interventional Cardiology

## 2017-12-28 DIAGNOSIS — Z5181 Encounter for therapeutic drug level monitoring: Secondary | ICD-10-CM | POA: Diagnosis not present

## 2017-12-28 DIAGNOSIS — Z952 Presence of prosthetic heart valve: Secondary | ICD-10-CM | POA: Diagnosis not present

## 2017-12-28 LAB — POCT INR: INR: 2.2 (ref 2.0–3.0)

## 2017-12-28 NOTE — Patient Instructions (Addendum)
Description   Spoke with pt instructed to take 7.5mg  today and tomorrow then continue same dose of coumadin 5mg  daily except 2.5mg  on Tuesdays.  Recheck INR in 1 week.  Amiodarone stopped on 08/31/17.

## 2018-01-04 ENCOUNTER — Ambulatory Visit (INDEPENDENT_AMBULATORY_CARE_PROVIDER_SITE_OTHER): Payer: Medicare Other | Admitting: Internal Medicine

## 2018-01-04 DIAGNOSIS — Z5181 Encounter for therapeutic drug level monitoring: Secondary | ICD-10-CM | POA: Diagnosis not present

## 2018-01-04 LAB — POCT INR: INR: 2.7 (ref 2.0–3.0)

## 2018-01-09 ENCOUNTER — Ambulatory Visit (INDEPENDENT_AMBULATORY_CARE_PROVIDER_SITE_OTHER): Payer: Medicare Other

## 2018-01-09 DIAGNOSIS — Z95810 Presence of automatic (implantable) cardiac defibrillator: Secondary | ICD-10-CM | POA: Diagnosis not present

## 2018-01-09 DIAGNOSIS — I5022 Chronic systolic (congestive) heart failure: Secondary | ICD-10-CM | POA: Diagnosis not present

## 2018-01-09 NOTE — Progress Notes (Signed)
EPIC Encounter for ICM Monitoring  Patient Name: Austin [name] is a 77 y.o. male Date: 01/09/2018 Primary Care Physican: Lajean Manes, MD Primary Cardiologist:Smith Electrophysiologist: Druscilla Brownie Weight: 204lbs        Heart Failure questions reviewed, pt asymptomatic.  He reported eating out more in the last couple of weeks.  Took PRN Lasix today due to weight gain of a few pounds   Thoracic impedance normal.  Prescribed dosage: Furosemide 20 mg 1 tablet as needed.   Labs: 05/02/2017 Creatinine 1.27, BUN 19, Potassium 4.4, Sodium 137, EGFR 55-64 10/28/2015 Creatinine 1.55, BUN 27, Potassium 3.9, Sodium 139, EGFR 43-50  Recommendations: No changes.  Encouraged to call for fluid symptoms.  Follow-up plan: ICM clinic phone appointment on 02/09/2018.    Copy of ICM check sent to Dr. Lovena Le.   3 month ICM trend: 01/09/2018    1 Year ICM trend:       [name], RN 01/09/2018 3:53 PM

## 2018-01-13 ENCOUNTER — Ambulatory Visit (INDEPENDENT_AMBULATORY_CARE_PROVIDER_SITE_OTHER): Payer: Medicare Other | Admitting: Internal Medicine

## 2018-01-13 DIAGNOSIS — Z952 Presence of prosthetic heart valve: Secondary | ICD-10-CM | POA: Diagnosis not present

## 2018-01-13 DIAGNOSIS — Z5181 Encounter for therapeutic drug level monitoring: Secondary | ICD-10-CM | POA: Diagnosis not present

## 2018-01-13 LAB — POCT INR: INR: 2.5 (ref 2.0–3.0)

## 2018-01-13 NOTE — Patient Instructions (Signed)
Description   Spoke with pt instructed to take 7.5mg  today June 21st then change coumadin dose to  5mg  daily except 7.5mg  on Tuesdays   Recheck INR in 1 week on Friday.  Amiodarone stopped on 08/31/17.

## 2018-01-20 ENCOUNTER — Ambulatory Visit (INDEPENDENT_AMBULATORY_CARE_PROVIDER_SITE_OTHER): Payer: Medicare Other | Admitting: Internal Medicine

## 2018-01-20 DIAGNOSIS — Z5181 Encounter for therapeutic drug level monitoring: Secondary | ICD-10-CM

## 2018-01-20 LAB — POCT INR: INR: 3.5 — AB (ref 2.0–3.0)

## 2018-01-20 NOTE — Patient Instructions (Signed)
Description   Spoke with pt instructed to continue taking Coumadin 5mg  daily except 7.5mg  on Tuesdays   Recheck INR in 2 weeks.  Amiodarone stopped on 08/31/17.

## 2018-02-03 ENCOUNTER — Ambulatory Visit (INDEPENDENT_AMBULATORY_CARE_PROVIDER_SITE_OTHER): Payer: Medicare Other | Admitting: Cardiology

## 2018-02-03 DIAGNOSIS — Z5181 Encounter for therapeutic drug level monitoring: Secondary | ICD-10-CM | POA: Diagnosis not present

## 2018-02-03 DIAGNOSIS — Z952 Presence of prosthetic heart valve: Secondary | ICD-10-CM

## 2018-02-03 LAB — POCT INR: INR: 3.3 — AB (ref 2.0–3.0)

## 2018-02-03 NOTE — Patient Instructions (Signed)
Description   Spoke with pt instructed to continue taking Coumadin 5mg  daily except 7.5mg  on Tuesdays   Recheck INR in 2 weeks.  Amiodarone stopped on 08/31/17.

## 2018-02-09 ENCOUNTER — Ambulatory Visit (INDEPENDENT_AMBULATORY_CARE_PROVIDER_SITE_OTHER): Payer: Medicare Other

## 2018-02-09 DIAGNOSIS — Z95810 Presence of automatic (implantable) cardiac defibrillator: Secondary | ICD-10-CM

## 2018-02-09 DIAGNOSIS — I5022 Chronic systolic (congestive) heart failure: Secondary | ICD-10-CM | POA: Diagnosis not present

## 2018-02-09 NOTE — Progress Notes (Signed)
EPIC Encounter for ICM Monitoring  Patient Name: Austin [name] is a 77 y.o. male Date: 02/09/2018 Primary Care Physican: Lajean Manes, MD Primary Cardiologist:Smith Electrophysiologist: Lovena Le Dry Weight: Previous weight 204lbs       Attempted call to patient and unable to reach.  Left message to return call.  Transmission reviewed.    Thoracic impedance normal.  Prescribed dosage: Furosemide 20 mg 1 tablet as needed.   Labs: 05/02/2017 Creatinine 1.27, BUN 19, Potassium 4.4, Sodium 137, EGFR 55-64 10/28/2015 Creatinine 1.55, BUN 27, Potassium 3.9, Sodium 139, EGFR 43-50  Recommendations: NONE - Unable to reach.  Follow-up plan: ICM clinic phone appointment on 03/13/2018.   Office appointment scheduled 04/18/2018 with Dr. Lovena Le.    Copy of ICM check sent to Dr. Lovena Le.   3 month ICM trend: 02/09/2018    1 Year ICM trend:       [name], RN 02/09/2018 12:03 PM

## 2018-02-10 ENCOUNTER — Telehealth: Payer: Self-pay

## 2018-02-10 NOTE — Telephone Encounter (Signed)
Remote ICM transmission received.  Attempted call to patient and left message to return call. 

## 2018-02-13 NOTE — Progress Notes (Signed)
Call back to patient as requested by voice mail message.  Spoke with wife and patient.  Today's weight is 202.2 lbs and has been stable for past 10 days.  If weight is > 203.5 then he takes a Lasix.  He has some swelling in feet for past year and may be related to neuropathy.  Advised to call if he has any fluid symptoms that is not resolved by Lasix.  Dr Lovena Le is the primary cardiologist and only sees Dr Tamala Julian if needed.

## 2018-02-17 ENCOUNTER — Ambulatory Visit (INDEPENDENT_AMBULATORY_CARE_PROVIDER_SITE_OTHER): Payer: Medicare Other | Admitting: Internal Medicine

## 2018-02-17 DIAGNOSIS — Z5181 Encounter for therapeutic drug level monitoring: Secondary | ICD-10-CM | POA: Diagnosis not present

## 2018-02-17 LAB — POCT INR: INR: 3 (ref 2.0–3.0)

## 2018-02-17 NOTE — Patient Instructions (Signed)
Description   Spoke with pt instructed to continue taking Coumadin 5mg  daily except 7.5mg  on Tuesdays.   Recheck INR in 2 weeks.

## 2018-03-07 ENCOUNTER — Ambulatory Visit (INDEPENDENT_AMBULATORY_CARE_PROVIDER_SITE_OTHER): Payer: Medicare Other | Admitting: Cardiology

## 2018-03-07 DIAGNOSIS — Z5181 Encounter for therapeutic drug level monitoring: Secondary | ICD-10-CM

## 2018-03-07 LAB — POCT INR: INR: 3.4 — AB (ref 2.0–3.0)

## 2018-03-13 ENCOUNTER — Ambulatory Visit (INDEPENDENT_AMBULATORY_CARE_PROVIDER_SITE_OTHER): Payer: Medicare Other

## 2018-03-13 ENCOUNTER — Ambulatory Visit (INDEPENDENT_AMBULATORY_CARE_PROVIDER_SITE_OTHER): Payer: Medicare Other | Admitting: *Deleted

## 2018-03-13 DIAGNOSIS — Z95810 Presence of automatic (implantable) cardiac defibrillator: Secondary | ICD-10-CM | POA: Diagnosis not present

## 2018-03-13 DIAGNOSIS — I5022 Chronic systolic (congestive) heart failure: Secondary | ICD-10-CM | POA: Diagnosis not present

## 2018-03-13 DIAGNOSIS — I472 Ventricular tachycardia, unspecified: Secondary | ICD-10-CM

## 2018-03-13 NOTE — Progress Notes (Signed)
EPIC Encounter for ICM Monitoring  Patient Name: Austin [name] is a 77 y.o. male Date: 03/13/2018 Primary Care Physican: Stoneking, Hal, MD Primary Cardiologist:Smith Electrophysiologist: Taylor Dry Weight: 199.5lbs      Heart Failure questions reviewed, pt asymptomatic.  Recent trip to [place] for 2 weeks and did really well.    Thoracic impedance normal.  Prescribed dosage: Furosemide 20 mg 1 tablet as needed. No PRN Furosemide needed in last month.   Labs: 05/02/2017 Creatinine 1.27, BUN 19, Potassium 4.4, Sodium 137, EGFR 55-64 10/28/2015 Creatinine 1.55, BUN 27, Potassium 3.9, Sodium 139, EGFR 43-50  Recommendations: No changes.    Encouraged to call for fluid symptoms.  Follow-up plan: ICM clinic phone appointment on 05/19/2018.   Office defib check appointment scheduled 04/18/2018 with Dr. Taylor.    Copy of ICM check sent to Dr. Taylor.   3 month ICM trend: 03/13/2018    1 Year ICM trend:       Laurie S Short, RN 03/13/2018 1:22 PM   

## 2018-03-14 ENCOUNTER — Telehealth: Payer: Self-pay

## 2018-03-14 NOTE — Telephone Encounter (Signed)
Remote ICM transmission received.  Attempted call to patient and wife stated he was unavailable.

## 2018-03-14 NOTE — Progress Notes (Signed)
Remote ICD transmission.   

## 2018-03-21 ENCOUNTER — Ambulatory Visit (INDEPENDENT_AMBULATORY_CARE_PROVIDER_SITE_OTHER): Payer: Medicare Other | Admitting: Pharmacist

## 2018-03-21 DIAGNOSIS — Z5181 Encounter for therapeutic drug level monitoring: Secondary | ICD-10-CM

## 2018-03-21 LAB — POCT INR: INR: 3.1 — AB (ref 2.0–3.0)

## 2018-04-07 LAB — CUP PACEART REMOTE DEVICE CHECK
Battery Remaining Longevity: 124 mo
Battery Voltage: 3.03 V
Brady Statistic AP VP Percent: 0 %
Brady Statistic RV Percent Paced: 18.01 %
Date Time Interrogation Session: 20190819083724
HIGH POWER IMPEDANCE MEASURED VALUE: 74 Ohm
Implantable Lead Implant Date: 20130110
Implantable Lead Model: 5076
Implantable Pulse Generator Implant Date: 20181010
Lead Channel Impedance Value: 285 Ohm
Lead Channel Pacing Threshold Amplitude: 0.75 V
Lead Channel Sensing Intrinsic Amplitude: 3.75 mV
Lead Channel Sensing Intrinsic Amplitude: 4.25 mV
Lead Channel Setting Sensing Sensitivity: 0.3 mV
MDC IDC LEAD IMPLANT DT: 20130110
MDC IDC LEAD LOCATION: 753859
MDC IDC LEAD LOCATION: 753860
MDC IDC MSMT LEADCHNL RA IMPEDANCE VALUE: 399 Ohm
MDC IDC MSMT LEADCHNL RA SENSING INTR AMPL: 4.25 mV
MDC IDC MSMT LEADCHNL RV IMPEDANCE VALUE: 342 Ohm
MDC IDC MSMT LEADCHNL RV PACING THRESHOLD PULSEWIDTH: 0.4 ms
MDC IDC MSMT LEADCHNL RV SENSING INTR AMPL: 3.75 mV
MDC IDC SET LEADCHNL RV PACING AMPLITUDE: 2.5 V
MDC IDC SET LEADCHNL RV PACING PULSEWIDTH: 0.4 ms
MDC IDC STAT BRADY AP VS PERCENT: 0 %
MDC IDC STAT BRADY AS VP PERCENT: 16.65 %
MDC IDC STAT BRADY AS VS PERCENT: 83.35 %
MDC IDC STAT BRADY RA PERCENT PACED: 0 %

## 2018-04-11 ENCOUNTER — Ambulatory Visit (INDEPENDENT_AMBULATORY_CARE_PROVIDER_SITE_OTHER): Payer: Medicare Other | Admitting: Cardiology

## 2018-04-11 DIAGNOSIS — Z5181 Encounter for therapeutic drug level monitoring: Secondary | ICD-10-CM | POA: Diagnosis not present

## 2018-04-11 LAB — POCT INR: INR: 2.9 (ref 2.0–3.0)

## 2018-04-12 ENCOUNTER — Other Ambulatory Visit: Payer: Self-pay | Admitting: Interventional Cardiology

## 2018-04-18 ENCOUNTER — Ambulatory Visit (INDEPENDENT_AMBULATORY_CARE_PROVIDER_SITE_OTHER): Payer: Self-pay | Admitting: Internal Medicine

## 2018-04-18 ENCOUNTER — Ambulatory Visit: Payer: Medicare Other | Admitting: Internal Medicine

## 2018-04-18 ENCOUNTER — Encounter: Payer: Self-pay | Admitting: Internal Medicine

## 2018-04-18 DIAGNOSIS — I5022 Chronic systolic (congestive) heart failure: Secondary | ICD-10-CM

## 2018-04-18 DIAGNOSIS — Z23 Encounter for immunization: Secondary | ICD-10-CM

## 2018-04-18 DIAGNOSIS — I481 Persistent atrial fibrillation: Secondary | ICD-10-CM

## 2018-04-18 DIAGNOSIS — Z5181 Encounter for therapeutic drug level monitoring: Secondary | ICD-10-CM

## 2018-04-18 DIAGNOSIS — Z95810 Presence of automatic (implantable) cardiac defibrillator: Secondary | ICD-10-CM

## 2018-04-18 DIAGNOSIS — I472 Ventricular tachycardia, unspecified: Secondary | ICD-10-CM

## 2018-04-18 DIAGNOSIS — I428 Other cardiomyopathies: Secondary | ICD-10-CM

## 2018-04-18 DIAGNOSIS — I4819 Other persistent atrial fibrillation: Secondary | ICD-10-CM

## 2018-04-18 LAB — CUP PACEART INCLINIC DEVICE CHECK
Battery Remaining Longevity: 124 mo
Brady Statistic AP VP Percent: 0 %
Brady Statistic AP VS Percent: 0 %
Brady Statistic AS VS Percent: 89.31 %
Brady Statistic RA Percent Paced: 0 %
HIGH POWER IMPEDANCE MEASURED VALUE: 71 Ohm
Implantable Lead Implant Date: 20130110
Implantable Lead Implant Date: 20130110
Implantable Lead Location: 753859
Lead Channel Impedance Value: 399 Ohm
Lead Channel Pacing Threshold Amplitude: 0.75 V
Lead Channel Pacing Threshold Pulse Width: 0.4 ms
Lead Channel Sensing Intrinsic Amplitude: 4.25 mV
Lead Channel Sensing Intrinsic Amplitude: 5 mV
Lead Channel Setting Pacing Pulse Width: 0.4 ms
Lead Channel Setting Sensing Sensitivity: 0.3 mV
MDC IDC LEAD LOCATION: 753860
MDC IDC MSMT BATTERY VOLTAGE: 3.03 V
MDC IDC MSMT LEADCHNL RA SENSING INTR AMPL: 4.25 mV
MDC IDC MSMT LEADCHNL RV IMPEDANCE VALUE: 342 Ohm
MDC IDC MSMT LEADCHNL RV IMPEDANCE VALUE: 418 Ohm
MDC IDC MSMT LEADCHNL RV SENSING INTR AMPL: 3.625 mV
MDC IDC PG IMPLANT DT: 20181010
MDC IDC SESS DTM: 20190924140752
MDC IDC SET LEADCHNL RV PACING AMPLITUDE: 2.5 V
MDC IDC STAT BRADY AS VP PERCENT: 10.69 %
MDC IDC STAT BRADY RV PERCENT PACED: 16.96 %

## 2018-04-18 LAB — T4, FREE: FREE T4: 1.52 ng/dL (ref 0.82–1.77)

## 2018-04-18 LAB — POCT INR: INR: 2.6 (ref 2.0–3.0)

## 2018-04-18 LAB — TSH: TSH: 2.49 u[IU]/mL (ref 0.450–4.500)

## 2018-04-18 NOTE — Progress Notes (Signed)
HPI Austin [name] returns today for followup of VT, chronic systolic heart failure and atrial fib with a CVR. In the interim, he has Had no recurrent chest pain or sob.  He is doing well with no chest pain or sob. He went to [place] and did well except for a fall.  He has not had any sustained ventricular arrhythmias. No shocks. No edema.  He is taking lasix as needed.  No Known Allergies   Current Outpatient Medications  Medication Sig Dispense Refill  . acetaminophen (TYLENOL) 500 MG tablet Take 1,000 mg by mouth every 6 (six) hours as needed (pain).    Marland Kitchen amoxicillin (AMOXIL) 500 MG tablet Take 1 tablet (500 mg total) by mouth 2 (two) times daily. 90 tablet 0  . folic acid (FOLVITE) 1 MG tablet Take 1 tablet (1 mg total) by mouth at bedtime. 90 tablet 3  . furosemide (LASIX) 20 MG tablet Take 1 tablet (20 mg total) by mouth daily as needed. For weight over 205 30 tablet 3  . gabapentin (NEURONTIN) 100 MG capsule Take 1 capsule by mouth 3 (three) times daily.  0  . levothyroxine (SYNTHROID) 50 MCG tablet Take 1 tablet (50 mcg total) by mouth daily before breakfast. 90 tablet 3  . lisinopril (PRINIVIL,ZESTRIL) 10 MG tablet Take 10 mg by mouth at bedtime.   9  . Multiple Vitamin (MULTIVITAMIN WITH MINERALS) TABS tablet Take 1 tablet by mouth daily.    Marland Kitchen warfarin (COUMADIN) 5 MG tablet TAKE AS DIRECTED BY COUMADIN CLINIC 100 tablet 1   No current facility-administered medications for this visit.      Past Medical History:  Diagnosis Date  . Atrial fibrillation ([place])   . 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 Conductive hearing loss   . Intraspinal abscess   . 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 Nonischemic cardiomyopathy ([place])   . Psychosexual dysfunction with inhibited sexual excitement   . S/P mitral valve replacement   . Syncope and collapse   .  Unspecified sleep apnea    last sleep study 11/07  . Ventricular tachycardia ([place])     ROS:   All systems reviewed and negative except as noted in the HPI.   Past Surgical History:  Procedure Laterality Date  . BIV ICD GENERATOR CHANGEOUT N/A 05/04/2017   Procedure: BiV ICD Generator Changeout;  Surgeon: Evans Lance, MD;  Location: [place] [facility];  Service: Cardiovascular;  Laterality: N/A;  . CARDIAC CATHETERIZATION  04/24/2002  . CARDIAC VALVE REPLACEMENT    . CARDIOVERSION  03/09/2012   Procedure: CARDIOVERSION;  Surgeon: Evans Lance, MD;  Location: [place];  Service: Cardiovascular;  Laterality: N/A;  . CARDIOVERSION N/A 11/22/2013   Procedure: CARDIOVERSION;  Surgeon: Sanda Klein, MD;  Location: [facility];  Service: Cardiovascular;  Laterality: N/A;  . dental implants    . ELECTROPHYSIOLOGIC STUDY N/A 09/15/2015   Procedure: V Tach Ablation;  Surgeon: Evans Lance, MD;  Location: [place] [facility];  Service: Cardiovascular;  Laterality: N/A;  . Evacution of epidural lumbar epidural abscess  1999  . INSERT / REPLACE / REMOVE PACEMAKER  11/2008  . MITRAL VALVE REPLACEMENT     w #[address] [facility] [facility]  . PERMANENT PACEMAKER INSERTION N/A 08/05/2011   Procedure: PERMANENT PACEMAKER INSERTION;  Surgeon: Evans Lance, MD;  Location: [facility] CATH  LAB;  Service: Cardiovascular;  Laterality: N/A;  . THYROIDECTOMY    . TONSILLECTOMY    . VALVE REPLACEMENT  2000     Family History  Problem Relation Age of Onset  . Heart disease Mother   . Heart failure Mother   . Heart disease Father   . Heart failure Father      Social History   Socioeconomic History  . Marital status: Married    Spouse name: Not on file  . Number of children: Not on file  . Years of education: Not on file  . Highest education level: Not on file  Occupational History  . Not on file  Social Needs  . Financial resource strain: Not on file  . Food insecurity:    Worry: Not on file    Inability:  Not on file  . Transportation needs:    Medical: Not on file    Non-medical: Not on file  Tobacco Use  . Smoking status: Never Smoker  . Smokeless tobacco: Never Used  Substance and Sexual Activity  . Alcohol use: Yes    Alcohol/week: 0.0 standard drinks    Comment: occasionally  . Drug use: No  . Sexual activity: Not on file  Lifestyle  . Physical activity:    Days per week: Not on file    Minutes per session: Not on file  . Stress: Not on file  Relationships  . Social connections:    Talks on phone: Not on file    Gets together: Not on file    Attends religious service: Not on file    Active member of club or organization: Not on file    Attends meetings of clubs or organizations: Not on file    Relationship status: Not on file  . Intimate partner violence:    Fear of current or ex partner: Not on file    Emotionally abused: Not on file    Physically abused: Not on file    Forced sexual activity: Not on file  Other Topics Concern  . Not on file  Social History Narrative  . Not on file     There were no vitals taken for this visit.  Physical Exam:  Well appearing NAD HEENT: Unremarkable Neck:  No JVD, no thyromegally Lymphatics:  No adenopathy Back:  No CVA tenderness Lungs:  Clear HEART:  Regular rate rhythm, no murmurs, no rubs, no clicks Abd:  soft, positive bowel sounds, no organomegally, no rebound, no guarding Ext:  2 plus pulses, no edema, no cyanosis, no clubbing Skin:  No rashes no nodules Neuro:  CN II through XII intact, motor grossly intact  EKG - atrial fib with ventricular pacing  DEVICE  Normal device function.  See PaceArt for details.   Assess/Plan: 1. Atrial fib - his ventricular rate is well controlled. No change in his meds. 2. ICD - his medtronic ICD is working normally. 3. VT - he has had some non-sustained VT. We will follow. No indication to go back on the amiodarone. We will check his thyroid function as he is still on  supplemental thyroid function. 4. 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 He will continue his current meds.   Mikle Bosworth.D.

## 2018-04-18 NOTE — Patient Instructions (Addendum)
Medication Instructions:  Your physician recommends that you continue on your current medications as directed. Please refer to the Current Medication list given to you today.  Labwork: You will get lab work today:  Free T4 and TSH  Testing/Procedures: None ordered.  Follow-Up: Your physician wants you to follow-up in: 6 months with Dr. Lovena Le.   You will receive a reminder letter in the mail two months in advance. If you don't receive a letter, please call our office to schedule the follow-up appointment.  Remote monitoring is used to monitor your ICD from home. This monitoring reduces the number of office visits required to check your device to one time per year. It allows [place] to keep an eye on the functioning of your device to ensure it is working properly. You are scheduled for a device check from home on 05/19/2018. You may send your transmission at any time that day. If you have a wireless device, the transmission will be sent automatically. After your physician reviews your transmission, you will receive a postcard with your next transmission date.  Any Other Special Instructions Will Be Listed Below (If Applicable).  If you need a refill on your cardiac medications before your next appointment, please call your pharmacy.

## 2018-05-02 ENCOUNTER — Ambulatory Visit (INDEPENDENT_AMBULATORY_CARE_PROVIDER_SITE_OTHER): Payer: Medicare Other | Admitting: Interventional Cardiology

## 2018-05-02 DIAGNOSIS — Z5181 Encounter for therapeutic drug level monitoring: Secondary | ICD-10-CM

## 2018-05-02 LAB — POCT INR: INR: 3.5 — AB (ref 2.0–3.0)

## 2018-05-05 ENCOUNTER — Encounter: Payer: Self-pay | Admitting: Neurology

## 2018-05-08 ENCOUNTER — Other Ambulatory Visit: Payer: Self-pay | Admitting: *Deleted

## 2018-05-08 DIAGNOSIS — G629 Polyneuropathy, unspecified: Secondary | ICD-10-CM

## 2018-05-16 ENCOUNTER — Ambulatory Visit (INDEPENDENT_AMBULATORY_CARE_PROVIDER_SITE_OTHER): Payer: Medicare Other | Admitting: Cardiology

## 2018-05-16 DIAGNOSIS — Z5181 Encounter for therapeutic drug level monitoring: Secondary | ICD-10-CM | POA: Diagnosis not present

## 2018-05-16 LAB — POCT INR: INR: 2.4 (ref 2.0–3.0)

## 2018-05-19 ENCOUNTER — Ambulatory Visit (INDEPENDENT_AMBULATORY_CARE_PROVIDER_SITE_OTHER): Payer: Medicare Other

## 2018-05-19 DIAGNOSIS — Z95810 Presence of automatic (implantable) cardiac defibrillator: Secondary | ICD-10-CM

## 2018-05-19 DIAGNOSIS — I5022 Chronic systolic (congestive) heart failure: Secondary | ICD-10-CM

## 2018-05-19 NOTE — Progress Notes (Signed)
EPIC Encounter for ICM Monitoring  Patient Name: Austin [name] is a 77 y.o. male Date: 05/19/2018 Primary Care Physican: Lajean Manes, MD Primary Cardiologist:Smith Electrophysiologist: Lovena Le Dry Weight:200lbs                Heart Failure questions reviewed, pt reported gaining a couple of pounds and feels like fluid.   Thoracic impedance abnormal suggesting fluid accumulation starting 05/09/2018.   Prescribed: Furosemide 20 mg 1 tablet as needed.   Labs: 05/02/2017 Creatinine 1.27, BUN 19, Potassium 4.4, Sodium 137, EGFR 55-64 10/28/2015 Creatinine 1.55, BUN 27, Potassium 3.9, Sodium 139, EGFR 43-50  Recommendations: Advised to take PRN Furosemide x 2 days.  Follow-up plan: ICM clinic phone appointment on 06/01/2018 to recheck fluid levels.     Copy of ICM check sent to Dr. Lovena Le and Dr Tamala Julian.   3 month ICM trend: 05/19/2018    1 Year ICM trend:       [name], RN 05/19/2018 7:31 AM

## 2018-05-24 ENCOUNTER — Ambulatory Visit (INDEPENDENT_AMBULATORY_CARE_PROVIDER_SITE_OTHER): Payer: Medicare Other | Admitting: Cardiology

## 2018-05-24 DIAGNOSIS — Z5181 Encounter for therapeutic drug level monitoring: Secondary | ICD-10-CM

## 2018-05-24 LAB — POCT INR: INR: 2.8 (ref 2.0–3.0)

## 2018-06-01 ENCOUNTER — Ambulatory Visit (INDEPENDENT_AMBULATORY_CARE_PROVIDER_SITE_OTHER): Payer: Medicare Other

## 2018-06-01 DIAGNOSIS — Z95810 Presence of automatic (implantable) cardiac defibrillator: Secondary | ICD-10-CM

## 2018-06-01 DIAGNOSIS — I5022 Chronic systolic (congestive) heart failure: Secondary | ICD-10-CM

## 2018-06-02 NOTE — Progress Notes (Signed)
EPIC Encounter for ICM Monitoring  Patient Name: Austin [name] is a 77 y.o. male Date: 06/02/2018 Primary Care Physican: Lajean Manes, MD Primary Cardiologist:Smith Electrophysiologist: Lovena Le Last Weight: 200lbs Today's Weight: 201.1 lbs        Heart Failure questions reviewed, pt symptomatic with 2 lb weight.   Thoracic impedance returned to normal after taking 2 days of PRN Furosemide but impedance decreased for last 2 days.   Prescribed: Furosemide 20 mg 1 tablet as needed.   Labs: 05/02/2017 Creatinine 1.27, BUN 19, Potassium 4.4, Sodium 137, EGFR 55-64 10/28/2015 Creatinine 1.55, BUN 27, Potassium 3.9, Sodium 139, EGFR 43-50  Recommendations:  He will take PRN Furosemide 1 tablet x 1-2 days.  Encouraged to call for fluid symptoms.  Follow-up plan: ICM clinic phone appointment on 06/19/2018.      Copy of ICM check sent to Dr. Lovena Le.   3 month ICM trend: 06/01/2018    1 Year ICM trend:       [name], RN 06/02/2018 10:10 AM

## 2018-06-06 ENCOUNTER — Ambulatory Visit: Payer: Medicare Other | Admitting: Neurology

## 2018-06-06 ENCOUNTER — Ambulatory Visit (INDEPENDENT_AMBULATORY_CARE_PROVIDER_SITE_OTHER): Payer: Medicare Other | Admitting: Interventional Cardiology

## 2018-06-06 DIAGNOSIS — Z5181 Encounter for therapeutic drug level monitoring: Secondary | ICD-10-CM

## 2018-06-06 DIAGNOSIS — G629 Polyneuropathy, unspecified: Secondary | ICD-10-CM

## 2018-06-06 LAB — POCT INR: INR: 3.5 — AB (ref 2.0–3.0)

## 2018-06-06 NOTE — Procedures (Signed)
[facility]  [address]  [place], [place] [address] Tel: [phone] Fax:  [phone] Test Date:  06/06/2018  Patient: Austin [name] DOB: 01-Oct-1940 Physician: Narda Amber, DO  Sex: Male Height: 5\' 9"  Ref Phys: Lajean Manes, MD  ID#: [id number] Temp: 34.0C Technician:    Patient Complaints: This is a 77 year old man referred for evaluation of bilateral feet tingling and numbness.  NCV & EMG Findings: Extensive electrodiagnostic testing of the right lower extremity and additional studies of the left shows:  1. Bilateral sural and superficial peroneal sensory responses are absent. 2. Bilateral peroneal motor responses at the extensor digitorum brevis are absent, and normal at the tibialis anterior.  Bilateral tibial motor responses show reduced amplitudes. 3. Bilateral tibial H reflex studies show prolonged latencies. 4. Chronic motor axonal loss changes are seen affecting the muscles below the knee, without accompanied active denervation.  Proximal and deep muscles were not tested as the patient is on anticoagulation therapy.   Impression: The electrophysiologic findings are most consistent with a chronic sensorimotor axonal polyneuropathy affecting the lower extremities.   ___________________________ Narda Amber, DO    Nerve Conduction Studies Anti Sensory Summary Table   Site NR Peak (ms) Norm Peak (ms) P-T Amp (V) Norm P-T Amp  Left Sup Peroneal Anti Sensory (Ant Lat Mall)  34C  12 cm NR  <4.6  >3  Right Sup Peroneal Anti Sensory (Ant Lat Mall)  34C  12 cm NR  <4.6  >3  Left Sural Anti Sensory (Lat Mall)  34C  Calf NR  <4.6  >3  Right Sural Anti Sensory (Lat Mall)  34C  Calf NR  <4.6  >3   Motor Summary Table   Site NR Onset (ms) Norm Onset (ms) O-P Amp (mV) Norm O-P Amp Site1 Site2 Delta-0 (ms) Dist (cm) Vel (m/s) Norm Vel (m/s)  Left Peroneal Motor (Ext Dig Brev)  34C  Ankle NR  <6.0  >2.5 B Fib Ankle  0.0  >40  B Fib NR      Poplt B Fib  0.0  >40  Poplt NR            Right Peroneal Motor (Ext Dig Brev)  34C  Ankle NR  <6.0  >2.5 B Fib Ankle  0.0  >40  B Fib NR     Poplt B Fib  0.0  >40  Poplt NR            Left Peroneal TA Motor (Tib Ant)  34C  Fib Head    2.7 <4.5 4.5 >3 Poplit Fib Head 1.4 10.0 71 >40  Poplit    4.1  4.4         Right Peroneal TA Motor (Tib Ant)  34C  Fib Head    3.2 <4.5 3.6 >3 Poplit Fib Head 1.3 10.0 77 >40  Poplit    4.5  3.5         Left Tibial Motor (Abd Hall Brev)  34C  Ankle    4.6 <6.0 1.8 >4 Knee Ankle 10.2 42.0 41 >40  Knee    14.8  1.4         Right Tibial Motor (Abd Hall Brev)  34C  Ankle    3.1 <6.0 2.1 >4 Knee Ankle 11.4 47.0 41 >40  Knee    14.5  1.2          H Reflex Studies   NR H-Lat (ms) Lat Norm (ms) L-R H-Lat (ms)  Left Tibial (Gastroc)  34C     40.00 <35 0.00  Right Tibial (Gastroc)  34C     40.00 <35 0.00   EMG   Side Muscle Ins Act Fibs Psw Fasc Number Recrt Dur Dur. Amp Amp. Poly Poly. Comment  Right AntTibialis Nml Nml Nml Nml 1- Rapid Some 1+ Some 1+ Some 1+ N/A  Right Gastroc Nml Nml Nml Nml 1- Rapid Some 1+ Some 1+ Some 1+ N/A  Right RectFemoris Nml Nml Nml Nml Nml Nml Nml Nml Nml Nml Nml Nml N/A  Right BicepsFemS Nml Nml Nml Nml Nml Nml Nml Nml Nml Nml Nml Nml N/A  Left BicepsFemS Nml Nml Nml Nml Nml Nml Nml Nml Nml Nml Nml Nml N/A  Left AntTibialis Nml Nml Nml Nml 1- Rapid Some 1+ Some 1+ Some 1+ N/A  Left Gastroc Nml Nml Nml Nml 1- Rapid Some 1+ Some 1+ Some 1+ N/A  Left RectFemoris Nml Nml Nml Nml Nml Nml Nml Nml Nml Nml Nml Nml N/A      Waveforms:

## 2018-06-09 ENCOUNTER — Encounter: Payer: Self-pay | Admitting: Neurology

## 2018-06-19 ENCOUNTER — Ambulatory Visit (INDEPENDENT_AMBULATORY_CARE_PROVIDER_SITE_OTHER): Payer: Medicare Other

## 2018-06-19 ENCOUNTER — Ambulatory Visit (INDEPENDENT_AMBULATORY_CARE_PROVIDER_SITE_OTHER): Payer: Medicare Other | Admitting: Pharmacist

## 2018-06-19 DIAGNOSIS — I472 Ventricular tachycardia, unspecified: Secondary | ICD-10-CM

## 2018-06-19 DIAGNOSIS — Z95810 Presence of automatic (implantable) cardiac defibrillator: Secondary | ICD-10-CM

## 2018-06-19 DIAGNOSIS — Z5181 Encounter for therapeutic drug level monitoring: Secondary | ICD-10-CM

## 2018-06-19 DIAGNOSIS — I5022 Chronic systolic (congestive) heart failure: Secondary | ICD-10-CM

## 2018-06-19 DIAGNOSIS — I428 Other cardiomyopathies: Secondary | ICD-10-CM

## 2018-06-19 LAB — POCT INR: INR: 3.2 — AB (ref 2.0–3.0)

## 2018-06-19 NOTE — Progress Notes (Signed)
Remote ICD transmission.   

## 2018-06-20 NOTE — Progress Notes (Signed)
EPIC Encounter for ICM Monitoring  Patient Name: Austin [name] is a 77 y.o. male Date: 06/20/2018 Primary Care Physican: Lajean Manes, MD Primary Cardiologist:Smith Electrophysiologist: Lovena Le Last Weight: 200lbs Today's Weight: 201 lbs                                                    Heart Failure questions reviewed, pt weight has remained about 1-2 lbs higher than normal since last ICM transmission.   He has taken PRN Lasix x 5 days.  Advised transmission has returned to normal 11/25.  He is limiting salt and occasionally eating at restaurants.   Thoracic impedance returned to normal after taking 2 days of PRN Furosemide but impedance decreased for last 2 days.   Prescribed: Furosemide 20 mg 1 tablet as needed.   Labs: 05/02/2017 Creatinine 1.27, BUN 19, Potassium 4.4, Sodium 137, EGFR 55-64 10/28/2015 Creatinine 1.55, BUN 27, Potassium 3.9, Sodium 139, EGFR 43-50  Recommendations:  Advised to limit salt.  Patient traveling 12/20 and requested next transmission in January.  Follow-up plan: ICM clinic phone appointment on 08/01/2018.      Copy of ICM check sent to Dr. Lovena Le.   3 month ICM trend: 06/19/2018    1 Year ICM trend:       [name], RN 06/20/2018 4:41 PM

## 2018-07-04 ENCOUNTER — Ambulatory Visit (INDEPENDENT_AMBULATORY_CARE_PROVIDER_SITE_OTHER): Payer: Medicare Other | Admitting: Cardiovascular Disease

## 2018-07-04 DIAGNOSIS — Z5181 Encounter for therapeutic drug level monitoring: Secondary | ICD-10-CM | POA: Diagnosis not present

## 2018-07-04 LAB — POCT INR: INR: 3.6 — AB (ref 2.0–3.0)

## 2018-07-11 ENCOUNTER — Other Ambulatory Visit: Payer: Self-pay | Admitting: Internal Medicine

## 2018-07-13 ENCOUNTER — Ambulatory Visit (INDEPENDENT_AMBULATORY_CARE_PROVIDER_SITE_OTHER): Payer: Medicare Other | Admitting: Cardiovascular Disease

## 2018-07-13 DIAGNOSIS — Z5181 Encounter for therapeutic drug level monitoring: Secondary | ICD-10-CM | POA: Diagnosis not present

## 2018-07-13 LAB — POCT INR: INR: 3.4 — AB (ref 2.0–3.0)

## 2018-07-27 ENCOUNTER — Ambulatory Visit (INDEPENDENT_AMBULATORY_CARE_PROVIDER_SITE_OTHER): Payer: Medicare Other | Admitting: Internal Medicine

## 2018-07-27 DIAGNOSIS — Z5181 Encounter for therapeutic drug level monitoring: Secondary | ICD-10-CM | POA: Diagnosis not present

## 2018-07-27 LAB — POCT INR: INR: 3.6 — AB (ref 2.0–3.0)

## 2018-08-01 ENCOUNTER — Ambulatory Visit (INDEPENDENT_AMBULATORY_CARE_PROVIDER_SITE_OTHER): Payer: Medicare Other

## 2018-08-01 DIAGNOSIS — Z95810 Presence of automatic (implantable) cardiac defibrillator: Secondary | ICD-10-CM | POA: Diagnosis not present

## 2018-08-01 DIAGNOSIS — I5022 Chronic systolic (congestive) heart failure: Secondary | ICD-10-CM | POA: Diagnosis not present

## 2018-08-01 NOTE — Progress Notes (Signed)
EPIC Encounter for ICM Monitoring  Patient Name: Austin [name] is a 78 y.o. male Date: 08/01/2018 Primary Care Physican: Lajean Manes, MD Primary Cardiologist:Smith Electrophysiologist: Lovena Le Last Weight:201lbs     Transmission received.  Patient was traveling during December decreased impedance.   Thoracic impedance normal.  Prescribed dosage: Furosemide 20 mg 1 tablet as needed.   Labs: 05/02/2017 Creatinine 1.27, BUN 19, Potassium 4.4, Sodium 137, EGFR 55-64 10/28/2015 Creatinine 1.55, BUN 27, Potassium 3.9, Sodium 139, EGFR 43-50  Recommendations: None.  Follow-up plan: ICM clinic phone appointment on 09/04/2018.    Copy of ICM check sent to Dr. Lovena Le.   3 month ICM trend: 08/01/2018    1 Year ICM trend:       [name], RN 08/01/2018 5:43 PM

## 2018-08-11 ENCOUNTER — Ambulatory Visit (INDEPENDENT_AMBULATORY_CARE_PROVIDER_SITE_OTHER): Payer: Medicare Other | Admitting: Pharmacist

## 2018-08-11 DIAGNOSIS — Z5181 Encounter for therapeutic drug level monitoring: Secondary | ICD-10-CM | POA: Diagnosis not present

## 2018-08-11 LAB — CUP PACEART REMOTE DEVICE CHECK
Battery Remaining Longevity: 121 mo
Brady Statistic RA Percent Paced: 0 %
Brady Statistic RV Percent Paced: 13.95 %
Date Time Interrogation Session: 20191125093724
HighPow Impedance: 69 Ohm
Implantable Lead Implant Date: 20130110
Implantable Lead Location: 753859
Implantable Lead Model: 5076
Implantable Lead Model: 6935
Implantable Pulse Generator Implant Date: 20181010
Lead Channel Impedance Value: 285 Ohm
Lead Channel Impedance Value: 361 Ohm
Lead Channel Impedance Value: 399 Ohm
Lead Channel Sensing Intrinsic Amplitude: 4.25 mV
Lead Channel Sensing Intrinsic Amplitude: 4.25 mV
MDC IDC LEAD IMPLANT DT: 20130110
MDC IDC LEAD LOCATION: 753860
MDC IDC MSMT BATTERY VOLTAGE: 3.02 V
MDC IDC MSMT LEADCHNL RV PACING THRESHOLD AMPLITUDE: 0.75 V
MDC IDC MSMT LEADCHNL RV PACING THRESHOLD PULSEWIDTH: 0.4 ms
MDC IDC MSMT LEADCHNL RV SENSING INTR AMPL: 3.375 mV
MDC IDC MSMT LEADCHNL RV SENSING INTR AMPL: 3.375 mV
MDC IDC SET LEADCHNL RV PACING AMPLITUDE: 2.5 V
MDC IDC SET LEADCHNL RV PACING PULSEWIDTH: 0.4 ms
MDC IDC SET LEADCHNL RV SENSING SENSITIVITY: 0.3 mV
MDC IDC STAT BRADY AP VP PERCENT: 0 %
MDC IDC STAT BRADY AP VS PERCENT: 0 %
MDC IDC STAT BRADY AS VP PERCENT: 6.37 %
MDC IDC STAT BRADY AS VS PERCENT: 93.63 %

## 2018-08-11 LAB — POCT INR: INR: 2.6 (ref 2.0–3.0)

## 2018-08-16 NOTE — Patient Instructions (Signed)
Description   Spoke with pt instructed to take 7.5mg  today, then continue taking 5mg  daily except 7.5mg  on Tuesdays, Thursdays and  Saturdays.   Recheck INR in 2 weeks.  self-tester.

## 2018-08-24 ENCOUNTER — Ambulatory Visit (INDEPENDENT_AMBULATORY_CARE_PROVIDER_SITE_OTHER): Payer: Medicare Other | Admitting: Cardiovascular Disease

## 2018-08-24 DIAGNOSIS — Z5181 Encounter for therapeutic drug level monitoring: Secondary | ICD-10-CM

## 2018-08-24 LAB — POCT INR: INR: 2.7 (ref 2.0–3.0)

## 2018-08-24 NOTE — Progress Notes (Signed)
[facility] Clinic Note - Initial Visit   Date: 08/25/18  Austin [name] MRN: [id number] DOB: 1941/03/02   Dear Dr. Felipa Eth:  Thank you for your kind referral of Austin [name] for consultation of neuropathy. Although his history is well known to you, please allow [place] to reiterate it for the purpose of our medical record. The patient was accompanied to the clinic by wife, retired Marine scientist, who also provides collateral information.     History of Present Illness: Austin [name] is a 78 y.o. right-handed Caucasian male with hypothyroidism, paroxysmal atrial fibrillation on coumadin, cardiomyopathy s/p PPM, CKD stage III, and hypertension presenting for evaluation of peripheral neuropathy.    Around the summer of 2019, he began having intermittent numbness/tingling of the toes and feet, which is worse at night time.  Over the past three months, he feels that his numbness has progressed and now is up to the level of the knees.  His leg numbness is triggered within walking 5 minutes and quickly alleviated with rest.  He does have chronic low back pain and has had previous lumbar surgery by Dr. Vertell Limber.  He does not have any recent imaging of his lumbar spine.  Upon further questioning, he has noticed that he can walk further if he is using a grocery cart. Electrodiagnostic testing performed in November 2019 confirmed the presence of a axonal polyneuropathy.  He has tried gabapentin 100 mg 3 times daily, without much relief.  He endorses some imbalance, fortunately no falls.  He does not have history of diabetes, exposure to chemotherapy, or history of heavy alcohol use.  No family history of neuropathy.  For many years, he has had bilateral throbbing ankle pain and gets relief with biofreeze.    Out-side paper records, electronic medical record, and images have been reviewed where available and summarized as:  NCS/EMG of the legs 06/06/2018: The electrophysiologic  findings are most consistent with a chronic sensorimotor axonal polyneuropathy affecting the lower extremities.  Past Medical History:  Diagnosis Date  . Atrial fibrillation ([place])   . 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 Conductive hearing loss   . Intraspinal abscess   . 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 Nonischemic cardiomyopathy ([place])   . Psychosexual dysfunction with inhibited sexual excitement   . S/P mitral valve replacement   . Syncope and collapse   . Unspecified sleep apnea    last sleep study 11/07  . Ventricular tachycardia [facility])     Past Surgical History:  Procedure Laterality Date  . BIV ICD GENERATOR CHANGEOUT N/A 05/04/2017   Procedure: BiV ICD Generator Changeout;  Surgeon: Evans Lance, MD;  Location: [facility];  Service: Cardiovascular;  Laterality: N/A;  . CARDIAC CATHETERIZATION  04/24/2002  . CARDIAC VALVE REPLACEMENT    . CARDIOVERSION  03/09/2012   Procedure: CARDIOVERSION;  Surgeon: Evans Lance, MD;  Location: [facility];  Service: Cardiovascular;  Laterality: N/A;  . CARDIOVERSION N/A 11/22/2013   Procedure: CARDIOVERSION;  Surgeon: Sanda Klein, MD;  Location: [facility];  Service: Cardiovascular;  Laterality: N/A;  . dental implants    . ELECTROPHYSIOLOGIC STUDY N/A 09/15/2015   Procedure: V Tach Ablation;  Surgeon: Evans Lance, MD;  Location: [facility];  Service: Cardiovascular;  Laterality: N/A;  . Evacution of epidural lumbar epidural abscess  1999  . INSERT /  REPLACE / REMOVE PACEMAKER  11/2008  . MITRAL VALVE REPLACEMENT     w #[id number] [facility]  . PERMANENT PACEMAKER INSERTION N/A 08/05/2011   Procedure: PERMANENT PACEMAKER INSERTION;  Surgeon: Evans Lance, MD;  [facility];  Service: Cardiovascular;  Laterality: N/A;  . THYROIDECTOMY    . TONSILLECTOMY    . VALVE REPLACEMENT   2000     Medications:  Outpatient Encounter Medications as of 08/25/2018  Medication Sig  . acetaminophen (TYLENOL) 500 MG tablet Take 1,000 mg by mouth every 6 (six) hours as needed (pain).  Marland Kitchen amoxicillin (AMOXIL) 500 MG tablet Take 1 tablet (500 mg total) by mouth 2 (two) times daily.  . furosemide (LASIX) 20 MG tablet TAKE 1 TABLET BY MOUTH DAILY AS NEEDED FOR WEIGHT OVER 205  . gabapentin (NEURONTIN) 100 MG capsule Take 1 capsule by mouth 3 (three) times daily.  Marland Kitchen levothyroxine (SYNTHROID) 50 MCG tablet Take 1 tablet (50 mcg total) by mouth daily before breakfast.  . lisinopril (PRINIVIL,ZESTRIL) 10 MG tablet Take 10 mg by mouth at bedtime.   . Multiple Vitamin (MULTIVITAMIN WITH MINERALS) TABS tablet Take 1 tablet by mouth daily.  Marland Kitchen warfarin (COUMADIN) 5 MG tablet TAKE AS DIRECTED BY COUMADIN CLINIC  . [DISCONTINUED] folic acid (FOLVITE) 1 MG tablet Take 1 tablet (1 mg total) by mouth at bedtime.   No facility-administered encounter medications on file as of 08/25/2018.      Allergies: No Known Allergies  Family History: Family History  Problem Relation Age of Onset  . Heart disease Mother   . Heart failure Mother   . Heart disease Father   . Heart failure Father     Social History: Social History   Tobacco Use  . Smoking status: Never Smoker  . Smokeless tobacco: Never Used  Substance Use Topics  . Alcohol use: Yes    Alcohol/week: 0.0 standard drinks    Comment: occasionally  . Drug use: No   Social History   Social History Narrative   Lives with wife in a one story home.  Has one daughter.  Retired.  Education: Masters.    Review of Systems:  CONSTITUTIONAL: No fevers, chills, night sweats, or weight loss.   EYES: No visual changes or eye pain ENT: No hearing changes.  No history of nose bleeds.   RESPIRATORY: No cough, wheezing and shortness of breath.   CARDIOVASCULAR: Negative for chest pain, and palpitations.   GI: Negative for abdominal discomfort,  blood in stools or black stools.  No recent change in bowel habits.   GU:  No history of incontinence.   MUSCLOSKELETAL: +history of joint pain or swelling.  No myalgias.   SKIN: Negative for lesions, rash, and itching.   HEMATOLOGY/ONCOLOGY: Negative for prolonged bleeding, bruising easily, and swollen nodes.  No history of cancer.   ENDOCRINE: Negative for cold or heat intolerance, polydipsia or goiter.   PSYCH:  No depression or anxiety symptoms.   NEURO: As Above.   Vital Signs:  BP 134/64   Pulse 81   Ht 6' 1.5" (1.867 m)   Wt 208 lb 6 oz (94.5 kg)   SpO2 99%   BMI 27.12 kg/m    General Medical Exam:   General:  Well appearing, comfortable.   Eyes/ENT: see cranial nerve examination.   Neck: No masses appreciated.  Full range of motion without tenderness.  No carotid bruits. Respiratory:  Clear to auscultation, good air entry bilaterally.   Cardiac: Irregularly irregular  rate and rhythm, systolic murmur.   Extremities: Venous stasis dermatitis.  No edema  Skin:  No rashes or lesions.  Dry skin over the legs.  Neurological Exam: MENTAL STATUS including orientation to time, place, person, recent and remote memory, attention span and concentration, language, and fund of knowledge is normal.  Speech is not dysarthric.  CRANIAL NERVES: II:  No visual field defects.  Unremarkable fundi.   III-IV-VI: Pupils equal round and reactive to light.  Normal conjugate, extra-ocular eye movements in all directions of gaze.  No nystagmus.  No ptosis.   V:  Normal facial sensation.     VII:  Normal facial symmetry and movements.  VIII:  Normal hearing and vestibular function.   IX-X:  Normal palatal movement.   XI:  Normal shoulder shrug and head rotation.   XII:  Normal tongue strength and range of motion, no deviation or fasciculation.  MOTOR:  No atrophy, fasciculations or abnormal movements.  No pronator drift.  Tone is normal.    Right Upper Extremity:    Left Upper Extremity:      Deltoid  5/5   Deltoid  5/5   Biceps  5/5   Biceps  5/5   Triceps  5/5   Triceps  5/5   Wrist extensors  5/5   Wrist extensors  5/5   Wrist flexors  5/5   Wrist flexors  5/5   Finger extensors  5/5   Finger extensors  5/5   Finger flexors  5/5   Finger flexors  5/5   Dorsal interossei  5/5   Dorsal interossei  5/5   Abductor pollicis  5/5   Abductor pollicis  5/5   Tone (Ashworth scale)  0  Tone (Ashworth scale)  0   Right Lower Extremity:    Left Lower Extremity:    Hip flexors  5/5   Hip flexors  5/5   Hip extensors  5/5   Hip extensors  5/5   Knee flexors  5/5   Knee flexors  5/5   Knee extensors  5/5   Knee extensors  5/5   Dorsiflexors  5/5   Dorsiflexors  5/5   Plantarflexors  5/5   Plantarflexors  5/5   Toe extensors  5/5   Toe extensors  5/5   Toe flexors  5/5   Toe flexors  5/5   Tone (Ashworth scale)  0  Tone (Ashworth scale)  0   MSRs:  Right                                                                 Left brachioradialis 2+  brachioradialis 2+  biceps 2+  biceps 2+  triceps 2+  triceps 2+  patellar 3+  patellar 3+  ankle jerk 1+  ankle jerk 1+  Hoffman no  Hoffman no  plantar response down  plantar response down   SENSORY: Reduced pinprick and temperature distal to the knees bilaterally, vibration is reduced to 40% at the knees and absent distal to ankles.  Romberg sign is positive.  Proprioception is impaired at the great toe.     COORDINATION/GAIT: Normal finger-to- nose-finger.  Intact rapid alternating movements bilaterally.  Gait appears mildly wide-based and stable, unassisted.  He is able to perform  stressed gait.  He is very unsteady with tandem gait.   IMPRESSION: 1.  Lumbar canal stenosis with neurogenic claudication manifesting with exertional leg numbness.  He has had prior lumbar surgery by Dr. Vertell Limber and I suspect there is degeneration causing spinal stenosis either above or below that level.  He is unable to get MRI due to having pacemaker. I will  order CT lumbar spine without contrast and make appropriate referral to Dr. Vertell Limber, if needed.  2.  Peripheral neuropathy affecting the lower legs and feet.  I suspect this is most likely idiopathic as he has no risk factors 1 neuropathy.  To be complete, laboratory testing for vitamin B12, folate, copper, SPEP with IFE will be ordered.  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  Fortunately, he has not suffered any falls.  Further recommendations pending results.    Thank you for allowing me to participate in patient's care.  If I can answer any additional questions, I would be pleased to do so.    Sincerely,    Dung Salinger K. Posey Pronto, DO

## 2018-08-25 ENCOUNTER — Other Ambulatory Visit (INDEPENDENT_AMBULATORY_CARE_PROVIDER_SITE_OTHER): Payer: Medicare Other

## 2018-08-25 ENCOUNTER — Ambulatory Visit: Payer: Medicare Other | Admitting: Neurology

## 2018-08-25 ENCOUNTER — Encounter: Payer: Self-pay | Admitting: Neurology

## 2018-08-25 VITALS — BP 134/64 | HR 81 | Ht 73.5 in | Wt 208.4 lb

## 2018-08-25 DIAGNOSIS — G629 Polyneuropathy, unspecified: Secondary | ICD-10-CM

## 2018-08-25 DIAGNOSIS — M4807 Spinal stenosis, lumbosacral region: Secondary | ICD-10-CM | POA: Insufficient documentation

## 2018-08-25 LAB — FOLATE: Folate: >24 ng/mL

## 2018-08-25 LAB — VITAMIN B12: Vitamin B-12: 481 pg/mL (ref 211–911)

## 2018-08-25 NOTE — Patient Instructions (Addendum)
CT lumbar spine without  Check labs for neuropathy  Be extra careful when walking, especially on uneven ground  We will call you with the results

## 2018-08-29 ENCOUNTER — Telehealth: Payer: Self-pay | Admitting: Pharmacist

## 2018-08-29 NOTE — Telephone Encounter (Signed)
Received faxed chart note from [facility] that Mr Boisclair will be undergoing bilateral upper eyelid blepharoplasty. No procedure date listed in note. Pt will need to hold warfarin for 5 days prior to procedure with Lovenox bridge due to history of mechanical mitral valve replacement. LMOM for pt to see if he has a procedure date set yet so that we can schedule Lovenox bridging appt 1 week prior.

## 2018-08-30 LAB — PROTEIN ELECTROPHORESIS, SERUM
Albumin ELP: 4 g/dL (ref 3.8–4.8)
Alpha 1: 0.3 g/dL (ref 0.2–0.3)
Alpha 2: 0.4 g/dL — ABNORMAL LOW (ref 0.5–0.9)
Beta 2: 0.4 g/dL (ref 0.2–0.5)
Beta Globulin: 0.4 g/dL (ref 0.4–0.6)
Gamma Globulin: 1.3 g/dL (ref 0.8–1.7)
TOTAL PROTEIN: 6.8 g/dL (ref 6.1–8.1)

## 2018-08-30 LAB — IMMUNOFIXATION ELECTROPHORESIS
IgG (Immunoglobin G), Serum: 1418 mg/dL (ref 600–1540)
IgM, Serum: 97 mg/dL (ref 50–300)
Immunofix Electr Int: NOT DETECTED
Immunoglobulin A: 268 mg/dL (ref 70–320)

## 2018-08-30 LAB — COPPER, SERUM: Copper: 131 ug/dL (ref 70–175)

## 2018-08-30 NOTE — Telephone Encounter (Signed)
Called pt and spoke with his wife - she states he is having reservations about having the procedure at all. Advised her that if he does go through with it, to contact us at least 1 week before procedure so that we can coordinate his Lovenox bridge in clinic. She verbalized understanding.

## 2018-08-31 ENCOUNTER — Telehealth: Payer: Self-pay | Admitting: *Deleted

## 2018-08-31 ENCOUNTER — Encounter: Payer: Self-pay | Admitting: *Deleted

## 2018-08-31 NOTE — Telephone Encounter (Signed)
Results sent via My Chart.  

## 2018-08-31 NOTE — Telephone Encounter (Signed)
-----   Message from Alda Berthold, DO sent at 08/31/2018 12:02 PM EST ----- Please notify patient lab are within normal limits.  Thank you.

## 2018-09-04 ENCOUNTER — Telehealth: Payer: Self-pay | Admitting: Neurology

## 2018-09-04 NOTE — Telephone Encounter (Signed)
Patient left a VM wanting to talk someone about a scan that was ordered for his back please call

## 2018-09-05 ENCOUNTER — Ambulatory Visit (INDEPENDENT_AMBULATORY_CARE_PROVIDER_SITE_OTHER): Payer: Medicare Other

## 2018-09-05 DIAGNOSIS — I5022 Chronic systolic (congestive) heart failure: Secondary | ICD-10-CM

## 2018-09-05 DIAGNOSIS — Z95810 Presence of automatic (implantable) cardiac defibrillator: Secondary | ICD-10-CM

## 2018-09-05 NOTE — Telephone Encounter (Signed)
Called patient back and he said that they have not schedule him yet.  I gave him the number to [facility] and he will call to set it up.

## 2018-09-06 NOTE — Progress Notes (Signed)
EPIC Encounter for ICM Monitoring  Patient Name: EWELL BENASSI is a 78 y.o. male Date: 09/06/2018 Primary Care Physican: Lajean Manes, MD Primary Cardiologist:Smith Electrophysiologist: Lovena Le Last Weight:203lbs      Spoke with wife.  Heart Failure questions reviewed.  Pt had weight gain during decreased impedance in January and took PRN Furosemide for a several days.  Weight is back to baseline and he is feeling fine now.     Report: Thoracic impedance normal.   Prescribed: Furosemide 20 mg 1 tablet as needed.   Labs: 05/02/2017 Creatinine 1.27, BUN 19, Potassium 4.4, Sodium 137, EGFR 55-64 10/28/2015 Creatinine 1.55, BUN 27, Potassium 3.9, Sodium 139, EGFR 43-50  Recommendations: No changes.  Reinforced limiting salt intake to < 2000 mg daily.  Encouraged to call for fluid symptoms.  Follow-up plan: ICM clinic phone appointment on 10/30/2018.   Office appt 09/26/2018 with Dr. Lovena Le.    Copy of ICM check sent to Dr. Lovena Le.   3 month ICM trend: 09/04/2018    1 Year ICM trend:       [name], RN 09/06/2018 9:30 AM

## 2018-09-07 ENCOUNTER — Ambulatory Visit (INDEPENDENT_AMBULATORY_CARE_PROVIDER_SITE_OTHER): Payer: Medicare Other | Admitting: Cardiovascular Disease

## 2018-09-07 DIAGNOSIS — Z5181 Encounter for therapeutic drug level monitoring: Secondary | ICD-10-CM | POA: Diagnosis not present

## 2018-09-07 LAB — POCT INR: INR: 2.8 (ref 2.0–3.0)

## 2018-09-07 NOTE — Patient Instructions (Signed)
Description   Spoke with pt instructed to take 10mg  today, then start taking 7.5mg  daily except 5mg  on Mondays and and Fridays.   Recheck INR in 1 week.  Self-tester.

## 2018-09-11 ENCOUNTER — Ambulatory Visit
Admission: RE | Admit: 2018-09-11 | Discharge: 2018-09-11 | Disposition: A | Discharge status: Home / Self Care | Payer: Medicare Other | Source: Ambulatory Visit | Attending: Neurology | Admitting: Neurology

## 2018-09-11 DIAGNOSIS — G629 Polyneuropathy, unspecified: Secondary | ICD-10-CM

## 2018-09-11 DIAGNOSIS — M4807 Spinal stenosis, lumbosacral region: Secondary | ICD-10-CM

## 2018-09-13 ENCOUNTER — Telehealth: Payer: Self-pay | Admitting: *Deleted

## 2018-09-13 NOTE — Telephone Encounter (Signed)
-----   Message from Alda Berthold, DO sent at 09/12/2018  6:07 PM EST ----- Please inform patient that his CT lumbar spine shows arthritis and degenerative changes with signs of prior surgery.  He does have nerve impingement which could cause his legs to fatigue.  I recommend starting physical therapy for strengthening and balance; if no improvement, we can seek Dr. Donald Pore opinion.  Follow-up with me in 3 months. Thanks.

## 2018-09-13 NOTE — Telephone Encounter (Signed)
Patient's wife given results and instructions.  Referral sent to [facility].

## 2018-09-14 ENCOUNTER — Ambulatory Visit (INDEPENDENT_AMBULATORY_CARE_PROVIDER_SITE_OTHER): Payer: Medicare Other | Admitting: Pharmacist

## 2018-09-14 DIAGNOSIS — Z5181 Encounter for therapeutic drug level monitoring: Secondary | ICD-10-CM

## 2018-09-14 LAB — POCT INR: INR: 3.8 — AB (ref 2.0–3.0)

## 2018-09-14 NOTE — Patient Instructions (Signed)
Description   Spoke with pt instructed to take 2.5mg g today, then start taking 7.5mg  daily except 5mg  on Mondays, Wednesdays, and Fridays.   Recheck INR in 1 week.  Self-tester.

## 2018-09-15 NOTE — Progress Notes (Signed)
Received notice from [facility] that pt's initial evaluation is scheduled for   02.26.2020 @ 11:30am

## 2018-09-18 ENCOUNTER — Ambulatory Visit (INDEPENDENT_AMBULATORY_CARE_PROVIDER_SITE_OTHER): Payer: Medicare Other | Admitting: *Deleted

## 2018-09-18 DIAGNOSIS — I428 Other cardiomyopathies: Secondary | ICD-10-CM | POA: Diagnosis not present

## 2018-09-19 LAB — CUP PACEART REMOTE DEVICE CHECK
Battery Remaining Longevity: 119 mo
Battery Voltage: 3.02 V
Brady Statistic AP VS Percent: 0 %
Brady Statistic AS VP Percent: 10.82 %
Brady Statistic RA Percent Paced: 0 %
Brady Statistic RV Percent Paced: 15.97 %
Date Time Interrogation Session: 20200224072824
HighPow Impedance: 79 Ohm
Implantable Lead Implant Date: 20130110
Implantable Lead Implant Date: 20130110
Implantable Lead Location: 753860
Implantable Lead Model: 5076
Implantable Lead Model: 6935
Implantable Pulse Generator Implant Date: 20181010
Lead Channel Impedance Value: 342 Ohm
Lead Channel Impedance Value: 418 Ohm
Lead Channel Impedance Value: 418 Ohm
Lead Channel Pacing Threshold Amplitude: 0.75 V
Lead Channel Pacing Threshold Pulse Width: 0.4 ms
Lead Channel Sensing Intrinsic Amplitude: 3.875 mV
Lead Channel Sensing Intrinsic Amplitude: 3.875 mV
Lead Channel Sensing Intrinsic Amplitude: 4.25 mV
Lead Channel Sensing Intrinsic Amplitude: 4.25 mV
Lead Channel Setting Pacing Amplitude: 2.5 V
Lead Channel Setting Pacing Pulse Width: 0.4 ms
Lead Channel Setting Sensing Sensitivity: 0.3 mV
MDC IDC LEAD LOCATION: 753859
MDC IDC STAT BRADY AP VP PERCENT: 0 %
MDC IDC STAT BRADY AS VS PERCENT: 89.18 %

## 2018-09-21 ENCOUNTER — Ambulatory Visit (INDEPENDENT_AMBULATORY_CARE_PROVIDER_SITE_OTHER): Payer: Medicare Other | Admitting: Cardiology

## 2018-09-21 DIAGNOSIS — Z5181 Encounter for therapeutic drug level monitoring: Secondary | ICD-10-CM | POA: Diagnosis not present

## 2018-09-21 LAB — POCT INR: INR: 3.3 — AB (ref 2.0–3.0)

## 2018-09-26 ENCOUNTER — Other Ambulatory Visit: Payer: Self-pay | Admitting: Interventional Cardiology

## 2018-09-26 ENCOUNTER — Encounter: Payer: Self-pay | Admitting: Internal Medicine

## 2018-09-26 ENCOUNTER — Encounter: Payer: Self-pay | Admitting: Cardiology

## 2018-09-26 ENCOUNTER — Ambulatory Visit: Payer: Medicare Other | Admitting: Internal Medicine

## 2018-09-26 VITALS — BP 102/60 | HR 53 | Ht 73.0 in | Wt 203.0 lb

## 2018-09-26 DIAGNOSIS — I472 Ventricular tachycardia, unspecified: Secondary | ICD-10-CM

## 2018-09-26 DIAGNOSIS — I4819 Other persistent atrial fibrillation: Secondary | ICD-10-CM | POA: Diagnosis not present

## 2018-09-26 DIAGNOSIS — Z95810 Presence of automatic (implantable) cardiac defibrillator: Secondary | ICD-10-CM | POA: Diagnosis not present

## 2018-09-26 DIAGNOSIS — I5022 Chronic systolic (congestive) heart failure: Secondary | ICD-10-CM | POA: Diagnosis not present

## 2018-09-26 NOTE — Progress Notes (Signed)
Remote ICD transmission.   

## 2018-09-26 NOTE — Patient Instructions (Addendum)
Medication Instructions:  Your physician recommends that you continue on your current medications as directed. Please refer to the Current Medication list given to you today.  Labwork: None ordered.  Testing/Procedures: None ordered.  Follow-Up: Your physician wants you to follow-up in: 6 months with Dr. Lovena Le.   You will receive a reminder letter in the mail two months in advance. If you don't receive a letter, please call our office to schedule the follow-up appointment.  Remote monitoring is used to monitor your ICD from home. This monitoring reduces the number of office visits required to check your device to one time per year. It allows [place] to keep an eye on the functioning of your device to ensure it is working properly. You are scheduled for a device check from home on 10/30/2018. You may send your transmission at any time that day. If you have a wireless device, the transmission will be sent automatically. After your physician reviews your transmission, you will receive a postcard with your next transmission date.  Any Other Special Instructions Will Be Listed Below (If Applicable).  If you need a refill on your cardiac medications before your next appointment, please call your pharmacy.

## 2018-09-26 NOTE — Progress Notes (Signed)
HPI Mr. Woodcox returns today for followup of VT, chronic systolic heart failure and atrial fib with a CVR. In the interim, he has had no recurrent chest pain orsob. He is doing well with no chest pain or sob.  He has not had any sustained ventricular arrhythmias. No shocks. No edema.  He is taking lasix as needed. He was diagnosed with peripheral neuropathy and has started taking gabapentin No Known Allergies   Current Outpatient Medications  Medication Sig Dispense Refill  . acetaminophen (TYLENOL) 500 MG tablet Take 1,000 mg by mouth every 6 (six) hours as needed (pain).    Marland Kitchen amoxicillin (AMOXIL) 500 MG tablet Take 1 tablet (500 mg total) by mouth 2 (two) times daily. 90 tablet 0  . furosemide (LASIX) 20 MG tablet TAKE 1 TABLET BY MOUTH DAILY AS NEEDED FOR WEIGHT OVER 205 90 tablet 2  . gabapentin (NEURONTIN) 100 MG capsule Take 1 capsule by mouth 3 (three) times daily.  0  . levothyroxine (SYNTHROID) 50 MCG tablet Take 1 tablet (50 mcg total) by mouth daily before breakfast. 90 tablet 3  . lisinopril (PRINIVIL,ZESTRIL) 10 MG tablet Take 10 mg by mouth at bedtime.   9  . Multiple Vitamin (MULTIVITAMIN WITH MINERALS) TABS tablet Take 1 tablet by mouth daily.    Marland Kitchen warfarin (COUMADIN) 5 MG tablet TAKE AS DIRECTED BY COUMADIN CLINIC 110 tablet 1   No current facility-administered medications for this visit.      Past Medical History:  Diagnosis Date  . Atrial fibrillation ([place])   . 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 Conductive hearing loss   . Intraspinal abscess   . 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 Nonischemic cardiomyopathy ([place])   . Psychosexual dysfunction with inhibited sexual excitement   . S/P mitral valve replacement   . Syncope and collapse   . Unspecified sleep apnea    last sleep study 11/07  . Ventricular tachycardia ([place])      ROS:   All systems reviewed and negative except as noted in the HPI.   Past Surgical History:  Procedure Laterality Date  . BIV ICD GENERATOR CHANGEOUT N/A 05/04/2017   Procedure: BiV ICD Generator Changeout;  Surgeon: Evans Lance, MD;  Location: [place] [facility];  Service: Cardiovascular;  Laterality: N/A;  . CARDIAC CATHETERIZATION  04/24/2002  . CARDIAC VALVE REPLACEMENT    . CARDIOVERSION  03/09/2012   Procedure: CARDIOVERSION;  Surgeon: Evans Lance, MD;  Location: [place];  Service: Cardiovascular;  Laterality: N/A;  . CARDIOVERSION N/A 11/22/2013   Procedure: CARDIOVERSION;  Surgeon: Sanda Klein, MD;  [facility];  Service: Cardiovascular;  Laterality: N/A;  . dental implants    . ELECTROPHYSIOLOGIC STUDY N/A 09/15/2015   Procedure: V Tach Ablation;  Surgeon: Evans Lance, MD;  Location: [place] [facility];  Service: Cardiovascular;  Laterality: N/A;  . Evacution of epidural lumbar epidural abscess  1999  . INSERT / REPLACE / REMOVE PACEMAKER  11/2008  . MITRAL VALVE REPLACEMENT     w #[address] [facility]  . PERMANENT PACEMAKER INSERTION N/A 08/05/2011   Procedure: PERMANENT PACEMAKER INSERTION;  Surgeon: Evans Lance, MD;  Location: [facility] CATH LAB;  Service: Cardiovascular;  Laterality: N/A;  . THYROIDECTOMY    . TONSILLECTOMY    . VALVE REPLACEMENT  2000  Family History  Problem Relation Age of Onset  . Heart disease Mother   . Heart failure Mother   . Heart disease Father   . Heart failure Father      Social History   Socioeconomic History  . Marital status: Married    Spouse name: Not on file  . Number of children: 1  . Years of education: 32  . Highest education level: Master's degree (e.g., MA, MS, MEng, MEd, MSW, MBA)  Occupational History  . Occupation: retired from Engineer, manufacturing systems  Social Needs  . Financial resource strain: Not on file  . Food insecurity:    Worry: Not on file    Inability: Not on file  . Transportation  needs:    Medical: Not on file    Non-medical: Not on file  Tobacco Use  . Smoking status: Never Smoker  . Smokeless tobacco: Never Used  Substance and Sexual Activity  . Alcohol use: Yes    Alcohol/week: 0.0 standard drinks    Comment: occasionally  . Drug use: No  . Sexual activity: Not on file  Lifestyle  . Physical activity:    Days per week: Not on file    Minutes per session: Not on file  . Stress: Not on file  Relationships  . Social connections:    Talks on phone: Not on file    Gets together: Not on file    Attends religious service: Not on file    Active member of club or organization: Not on file    Attends meetings of clubs or organizations: Not on file    Relationship status: Not on file  . Intimate partner violence:    Fear of current or ex partner: Not on file    Emotionally abused: Not on file    Physically abused: Not on file    Forced sexual activity: Not on file  Other Topics Concern  . Not on file  Social History Narrative   Lives with wife in a one story home.  Has one daughter.  Retired.  Education: Masters.     BP 102/60   Pulse (!) 53   Ht 6\' 1"  (1.854 m)   Wt 203 lb (92.1 kg)   SpO2 98%   BMI 26.78 kg/m   Physical Exam:  Well appearing NAD HEENT: Unremarkable Neck:  No JVD, no thyromegally Lymphatics:  No adenopathy Back:  No CVA tenderness Lungs:  Clear with no wheezes HEART:  Regular rate rhythm, no murmurs, no rubs, no clicks Abd:  soft, positive bowel sounds, no organomegally, no rebound, no guarding Ext:  2 plus pulses, no edema, no cyanosis, no clubbing Skin:  No rashes no nodules Neuro:  CN II through XII intact, motor grossly intact  EKG - atrial fib with RBBB  DEVICE  Normal device function.  See PaceArt for details.   Assess/Plan: 1. Chronic systolic heart failure - his symptoms are class 2A. He will continue his current meds. 2. Atrial fib - he is chronically in atrial fib. No change in his meds. His rate is well  controlled. 3. ICD - his [employer] Jude DDD ICD is working normally. 4. VT - he has been off of amiodarone and has not had sustained VT in a year. He will undergo watchful waiting.  Mikle Bosworth.D.

## 2018-09-28 ENCOUNTER — Ambulatory Visit (INDEPENDENT_AMBULATORY_CARE_PROVIDER_SITE_OTHER): Payer: Medicare Other | Admitting: Cardiology

## 2018-09-28 DIAGNOSIS — Z5181 Encounter for therapeutic drug level monitoring: Secondary | ICD-10-CM | POA: Diagnosis not present

## 2018-09-28 LAB — CUP PACEART INCLINIC DEVICE CHECK
Date Time Interrogation Session: 20200305093730
Implantable Lead Implant Date: 20130110
Implantable Lead Implant Date: 20130110
Implantable Lead Location: 753859
Implantable Lead Location: 753860
Implantable Lead Model: 5076
Implantable Lead Model: 6935
Implantable Pulse Generator Implant Date: 20181010

## 2018-09-28 LAB — POCT INR: INR: 3.7 — AB (ref 2.0–3.0)

## 2018-10-05 ENCOUNTER — Ambulatory Visit (INDEPENDENT_AMBULATORY_CARE_PROVIDER_SITE_OTHER): Payer: Medicare Other | Admitting: Interventional Cardiology

## 2018-10-05 DIAGNOSIS — Z5181 Encounter for therapeutic drug level monitoring: Secondary | ICD-10-CM

## 2018-10-05 LAB — POCT INR: INR: 2.9 (ref 2.0–3.0)

## 2018-10-05 NOTE — Patient Instructions (Signed)
Description   Spoke with pt and 10mg  today, then continue taking 5mg  daily except 7.5mg  on Tuesdays, Thursdays, Saturdays-dose pt states he has been taking. Recheck INR in 1 week. Self-tester.

## 2018-10-10 ENCOUNTER — Telehealth: Payer: Self-pay | Admitting: *Deleted

## 2018-10-10 NOTE — Telephone Encounter (Signed)
   [facility] Pre-operative Risk Assessment    Request for surgical clearance:  1. What type of surgery is being performed? B/L UPPER EYELID BLEPHAROPLASTY   2. When is this surgery scheduled? 10/31/18   3. What type of clearance is required (medical clearance vs. Pharmacy clearance to hold med vs. Both)? BOTH  4. Are there any medications that need to be held prior to surgery and how long?WARFARIN AS WELL AS PT HAS DEVICE  5. Practice name and name of physician performing surgery? [facility]; DR. Lorina Rabon   6. What is your office phone number [phone]     7.   What is your office fax number [phone]  8.   Anesthesia type (None, local, MAC, general) ? MAC   Julaine Hua 10/10/2018, 4:51 PM  _________________________________________________________________   (provider comments below)

## 2018-10-11 NOTE — Telephone Encounter (Signed)
   Primary Cardiologist: [name], MD  Chart reviewed as part of pre-operative protocol coverage. Given past medical history and time since last visit, based on ACC/AHA guidelines, KERIC ZEHREN would be at acceptable risk for the planned procedure without further cardiovascular testing.   I will route this recommendation to the requesting party via Epic fax function and remove from pre-op pool.  Please call with questions.  [place], [place] 10/11/2018, 10:28 AM

## 2018-10-11 NOTE — Telephone Encounter (Signed)
Patient with diagnosis of afib/mechanical MVR on warfarin for anticoagulation.    Procedure: B/L UPPER EYELID BLEPHAROPLASTY  Date of procedure: 10/31/2018  CHADS2-VASc score of  4 (CHF, HTN, AGE, DM2, stroke/tia x 2, CAD, AGE, male)  Per office protocol, patient can hold warfarin for 5 days prior to procedure.   Patient will need bridging with Lovenox (enoxaparin) around procedure. Patient will also need updated BMP and CBC prior to bridging Patient is a self tester- will coordinate bridge at next INR check

## 2018-10-12 ENCOUNTER — Telehealth: Payer: Self-pay | Admitting: Cardiology

## 2018-10-12 ENCOUNTER — Encounter (HOSPITAL_COMMUNITY): Payer: Self-pay | Admitting: *Deleted

## 2018-10-12 ENCOUNTER — Inpatient Hospital Stay (HOSPITAL_COMMUNITY)
Admission: EM | Admit: 2018-10-12 | Discharge: 2018-10-14 | DRG: 309 | Disposition: A | Discharge status: Home / Self Care | Payer: Medicare Other | Attending: Cardiology | Admitting: Cardiology

## 2018-10-12 ENCOUNTER — Emergency Department (HOSPITAL_COMMUNITY): Payer: Medicare Other

## 2018-10-12 ENCOUNTER — Other Ambulatory Visit: Payer: Self-pay

## 2018-10-12 ENCOUNTER — Inpatient Hospital Stay (HOSPITAL_COMMUNITY): Payer: Medicare Other

## 2018-10-12 DIAGNOSIS — I5022 Chronic systolic (congestive) heart failure: Secondary | ICD-10-CM | POA: Diagnosis present

## 2018-10-12 DIAGNOSIS — I482 Chronic atrial fibrillation, unspecified: Secondary | ICD-10-CM | POA: Diagnosis present

## 2018-10-12 DIAGNOSIS — I4821 Permanent atrial fibrillation: Secondary | ICD-10-CM | POA: Diagnosis present

## 2018-10-12 DIAGNOSIS — Z79890 Hormone replacement therapy (postmenopausal): Secondary | ICD-10-CM

## 2018-10-12 DIAGNOSIS — I472 Ventricular tachycardia, unspecified: Secondary | ICD-10-CM

## 2018-10-12 DIAGNOSIS — I4891 Unspecified atrial fibrillation: Secondary | ICD-10-CM | POA: Diagnosis present

## 2018-10-12 DIAGNOSIS — I5023 Acute on chronic systolic (congestive) heart failure: Secondary | ICD-10-CM | POA: Diagnosis present

## 2018-10-12 DIAGNOSIS — I493 Ventricular premature depolarization: Secondary | ICD-10-CM | POA: Diagnosis present

## 2018-10-12 DIAGNOSIS — Z95810 Presence of automatic (implantable) cardiac defibrillator: Secondary | ICD-10-CM | POA: Diagnosis not present

## 2018-10-12 DIAGNOSIS — R69 Illness, unspecified: Secondary | ICD-10-CM

## 2018-10-12 DIAGNOSIS — Z952 Presence of prosthetic heart valve: Secondary | ICD-10-CM

## 2018-10-12 DIAGNOSIS — I4729 Other ventricular tachycardia: Secondary | ICD-10-CM

## 2018-10-12 DIAGNOSIS — Z79899 Other long term (current) drug therapy: Secondary | ICD-10-CM

## 2018-10-12 DIAGNOSIS — Z8661 Personal history of infections of the central nervous system: Secondary | ICD-10-CM

## 2018-10-12 DIAGNOSIS — H902 Conductive hearing loss, unspecified: Secondary | ICD-10-CM | POA: Diagnosis present

## 2018-10-12 DIAGNOSIS — I428 Other cardiomyopathies: Secondary | ICD-10-CM | POA: Diagnosis present

## 2018-10-12 DIAGNOSIS — Z4502 Encounter for adjustment and management of automatic implantable cardiac defibrillator: Secondary | ICD-10-CM

## 2018-10-12 DIAGNOSIS — G4730 Sleep apnea, unspecified: Secondary | ICD-10-CM | POA: Diagnosis present

## 2018-10-12 DIAGNOSIS — I4892 Unspecified atrial flutter: Secondary | ICD-10-CM | POA: Diagnosis present

## 2018-10-12 DIAGNOSIS — Z9889 Other specified postprocedural states: Secondary | ICD-10-CM

## 2018-10-12 DIAGNOSIS — Z8679 Personal history of other diseases of the circulatory system: Secondary | ICD-10-CM

## 2018-10-12 DIAGNOSIS — G629 Polyneuropathy, unspecified: Secondary | ICD-10-CM | POA: Diagnosis present

## 2018-10-12 DIAGNOSIS — I361 Nonrheumatic tricuspid (valve) insufficiency: Secondary | ICD-10-CM | POA: Diagnosis not present

## 2018-10-12 DIAGNOSIS — Z7901 Long term (current) use of anticoagulants: Secondary | ICD-10-CM | POA: Diagnosis not present

## 2018-10-12 DIAGNOSIS — Z8249 Family history of ischemic heart disease and other diseases of the circulatory system: Secondary | ICD-10-CM | POA: Diagnosis not present

## 2018-10-12 DIAGNOSIS — R42 Dizziness and giddiness: Secondary | ICD-10-CM | POA: Diagnosis present

## 2018-10-12 HISTORY — DX: Presence of automatic (implantable) cardiac defibrillator: Z95.810

## 2018-10-12 HISTORY — DX: Ventricular tachycardia: I47.2

## 2018-10-12 HISTORY — DX: Ventricular tachycardia, unspecified: I47.20

## 2018-10-12 LAB — POCT I-STAT EG7
Bicarbonate: 26.5 mmol/L (ref 20.0–28.0)
Calcium, Ion: 1.15 mmol/L (ref 1.15–1.40)
HCT: 46 % (ref 39.0–52.0)
Hemoglobin: 15.6 g/dL (ref 13.0–17.0)
O2 SAT: 40 %
Potassium: 4.2 mmol/L (ref 3.5–5.1)
Sodium: 133 mmol/L — ABNORMAL LOW (ref 135–145)
TCO2: 28 mmol/L (ref 22–32)
pCO2, Ven: 49.7 mmHg (ref 44.0–60.0)
pH, Ven: 7.335 (ref 7.250–7.430)
pO2, Ven: 25 mmHg — CL (ref 32.0–45.0)

## 2018-10-12 LAB — BASIC METABOLIC PANEL
Anion gap: 12 (ref 5–15)
BUN: 25 mg/dL — AB (ref 8–23)
CO2: 22 mmol/L (ref 22–32)
Calcium: 9.3 mg/dL (ref 8.9–10.3)
Chloride: 98 mmol/L (ref 98–111)
Creatinine, Ser: 1.17 mg/dL (ref 0.61–1.24)
GFR calc Af Amer: >60 mL/min (ref >60)
GFR calc non Af Amer: >60 mL/min (ref >60)
Glucose, Bld: 143 mg/dL — ABNORMAL HIGH (ref 70–99)
Potassium: 4.2 mmol/L (ref 3.5–5.1)
Sodium: 132 mmol/L — ABNORMAL LOW (ref 135–145)

## 2018-10-12 LAB — CBC
HCT: 46.2 % (ref 39.0–52.0)
Hemoglobin: 14.8 g/dL (ref 13.0–17.0)
MCH: 30 pg (ref 26.0–34.0)
MCHC: 32 g/dL (ref 30.0–36.0)
MCV: 93.7 fL (ref 80.0–100.0)
Platelets: 159 10*3/uL (ref 150–400)
RBC: 4.93 MIL/uL (ref 4.22–5.81)
RDW: 15.3 % (ref 11.5–15.5)
WBC: 12 10*3/uL — ABNORMAL HIGH (ref 4.0–10.5)
nRBC: 0 % (ref 0.0–0.2)

## 2018-10-12 LAB — TROPONIN I
Troponin I: 0.16 ng/mL (ref <0.03)
Troponin I: 0.23 ng/mL (ref <0.03)

## 2018-10-12 LAB — I-STAT TROPONIN, ED: Troponin i, poc: 0.05 ng/mL (ref 0.00–0.08)

## 2018-10-12 LAB — MAGNESIUM: Magnesium: 2 mg/dL (ref 1.7–2.4)

## 2018-10-12 LAB — PROTIME-INR
INR: 2.9 — ABNORMAL HIGH (ref 0.8–1.2)
Prothrombin Time: 30.2 seconds — ABNORMAL HIGH (ref 11.4–15.2)

## 2018-10-12 LAB — CBG MONITORING, ED: Glucose-Capillary: 126 mg/dL — ABNORMAL HIGH (ref 70–99)

## 2018-10-12 LAB — I-STAT CREATININE, ED: Creatinine, Ser: 1.2 mg/dL (ref 0.61–1.24)

## 2018-10-12 MED ORDER — LISINOPRIL 10 MG PO TABS
10.0000 mg | ORAL_TABLET | Freq: Every day | ORAL | Status: DC
  Administered 2018-10-12 – 2018-10-13 (×2): 10 mg via ORAL
  Filled 2018-10-12 (×2): qty 1

## 2018-10-12 MED ORDER — FUROSEMIDE 20 MG PO TABS
20.0000 mg | ORAL_TABLET | Freq: Every day | ORAL | Status: DC
  Filled 2018-10-12: qty 1

## 2018-10-12 MED ORDER — LEVOTHYROXINE SODIUM 50 MCG PO TABS
50.0000 ug | ORAL_TABLET | Freq: Every day | ORAL | Status: DC
  Administered 2018-10-13 – 2018-10-14 (×2): 50 ug via ORAL
  Filled 2018-10-12 (×2): qty 1

## 2018-10-12 MED ORDER — NITROGLYCERIN 0.4 MG SL SUBL: 0.4000 mg | SUBLINGUAL_TABLET | SUBLINGUAL | Status: DC | PRN

## 2018-10-12 MED ORDER — AMIODARONE HCL IN DEXTROSE 360-4.14 MG/200ML-% IV SOLN
60.0000 mg/h | INTRAVENOUS | Status: AC
  Filled 2018-10-12: qty 200

## 2018-10-12 MED ORDER — SODIUM CHLORIDE 0.9 % IV BOLUS
500.0000 mL | Freq: Once | INTRAVENOUS | Status: AC
  Administered 2018-10-12: 500 mL via INTRAVENOUS

## 2018-10-12 MED ORDER — GABAPENTIN 100 MG PO CAPS
100.0000 mg | ORAL_CAPSULE | Freq: Three times a day (TID) | ORAL | Status: DC
  Administered 2018-10-12 – 2018-10-14 (×5): 100 mg via ORAL
  Filled 2018-10-12 (×5): qty 1

## 2018-10-12 MED ORDER — SODIUM CHLORIDE 0.9% FLUSH
3.0000 mL | Freq: Once | INTRAVENOUS | Status: AC
  Administered 2018-10-12: 3 mL via INTRAVENOUS

## 2018-10-12 MED ORDER — AMIODARONE IV BOLUS ONLY 150 MG/100ML
150.0000 mg | Freq: Once | INTRAVENOUS | Status: DC
  Filled 2018-10-12: qty 100

## 2018-10-12 MED ORDER — AMIODARONE HCL IN DEXTROSE 360-4.14 MG/200ML-% IV SOLN
30.0000 mg/h | INTRAVENOUS | Status: DC
  Administered 2018-10-12 – 2018-10-13 (×3): 30 mg/h via INTRAVENOUS
  Filled 2018-10-12 (×3): qty 200

## 2018-10-12 MED ORDER — ACETAMINOPHEN 325 MG PO TABS: 650.0000 mg | ORAL_TABLET | ORAL | Status: DC | PRN

## 2018-10-12 MED ORDER — AMIODARONE HCL IN DEXTROSE 360-4.14 MG/200ML-% IV SOLN
INTRAVENOUS | Status: AC
  Filled 2018-10-12: qty 200

## 2018-10-12 MED ORDER — ASPIRIN EC 81 MG PO TBEC
81.0000 mg | DELAYED_RELEASE_TABLET | Freq: Every day | ORAL | Status: DC
  Filled 2018-10-12: qty 1

## 2018-10-12 MED ORDER — ONDANSETRON HCL 4 MG/2ML IJ SOLN: 4.0000 mg | Freq: Four times a day (QID) | INTRAMUSCULAR | Status: DC | PRN

## 2018-10-12 NOTE — ED Provider Notes (Signed)
[facility] EMERGENCY DEPARTMENT Provider Note   CSN: [id number] Arrival date & time: 10/12/18  1242    History   Chief Complaint Chief Complaint  Patient presents with  . Defibrillator fired    HPI Austin [name] is a 77 y.o. male.     HPI Patient was playing golf.  He reports he was feeling well.  He suddenly started to feel lightheaded.  He sat down and tried to rest but then began feeling lightheaded again.  He did not have chest pain syncope or shortness of breath.  He reports as he was trying to rest, his defibrillator fired.  He reports it then fired rapidly a second time.  He denies he had loss of consciousness.  He however continued to feel lightheaded and somewhat fatigued or washed out.  Thus he was transported to the emergency department.  He reports he has been well recently.  He has had no problems with fevers, chills, vomiting, diarrhea, appetite loss, chest pain, shortness of breath.   Patient has complex cardiac history including ventricular tachycardia.  He reportedly has not had an episode in almost a year since a successful ablation.  He has been off of amiodarone for approximately a year.  He reports he is taking Neurontin but otherwise no significant new medication changes. Past Medical History:  Diagnosis Date  . Atrial fibrillation ([place])   . 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 Conductive hearing loss   . Intraspinal abscess   . 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 Nonischemic cardiomyopathy ([place])   . Psychosexual dysfunction with inhibited sexual excitement   . S/P mitral valve replacement   . Syncope and collapse   . Unspecified sleep apnea    last sleep study 11/07  . Ventricular tachycardia [facility])     Patient Active Problem List   Diagnosis Date Noted  . Neuropathy 08/25/2018  . 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 S/P mitral valve replacement   . Nonischemic cardiomyopathy ([place])   . Postprocedural hypotension 01/10/2016  . Subtherapeutic international normalized ratio (INR) 01/10/2016  . S/P ablation of ventricular arrhythmia 01/10/2016  . Tachycardia 10/27/2015  . Hypothyroidism 10/27/2015  . H/O mitral valve replacement with mechanical valve   . V tach ([place]) 10/26/2015  . Ventricular tachycardia ([place]) 08/22/2015  . Encounter for therapeutic drug monitoring 08/20/2013  . Mitral valve disorders(424.0) 05/15/2013  . Heart valve replaced by other means 05/15/2013  . Cardiac device, implant, or graft infection or inflammation ([place]) 06/28/2011  . Cardiomyopathy 06/28/2011  . 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 Chronic systolic heart failure ([place]) 04/15/2009  . HYPERCHOLESTEROLEMIA 11/27/2008  . ERECTILE DYSFUNCTION 11/27/2008  . ABSCESS, SPINE, EPIDURAL 11/27/2008  . HEARING LOSS 11/27/2008  . MITRAL REGURGITATION, SEVERE 11/27/2008  . VENTRICULAR TACHYCARDIA 11/27/2008  . Atrial fibrillation ([place]) 11/27/2008  . LEFT VENTRICULAR FUNCTION, DECREASED 11/27/2008  . SYNCOPE 11/27/2008  . SLEEP APNEA 11/27/2008  . MITRAL VALVE REPLACEMENT, HX OF 11/27/2008    Past Surgical History:  Procedure Laterality Date  . BIV ICD GENERATOR CHANGEOUT N/A 05/04/2017   Procedure: BiV ICD Generator Changeout;  Surgeon: Evans Lance, MD;  Location: [place] CV LAB;  Service: Cardiovascular;  Laterality: N/A;  . CARDIAC CATHETERIZATION  04/24/2002  . CARDIAC VALVE REPLACEMENT    . CARDIOVERSION  03/09/2012   Procedure: CARDIOVERSION;  Surgeon:  Evans Lance, MD;  Location: [place];  Service: Cardiovascular;  Laterality: N/A;  . CARDIOVERSION N/A 11/22/2013   Procedure: CARDIOVERSION;  Surgeon: Sanda Klein, MD;  Location: [facility];  Service: Cardiovascular;  Laterality: N/A;  . dental implants    . ELECTROPHYSIOLOGIC STUDY N/A 09/15/2015    Procedure: V Tach Ablation;  Surgeon: Evans Lance, MD;  Location: [place] CV LAB;  Service: Cardiovascular;  Laterality: N/A;  . Evacution of epidural lumbar epidural abscess  1999  . INSERT / REPLACE / REMOVE PACEMAKER  11/2008  . MITRAL VALVE REPLACEMENT     w #[address] [facility] [facility]  . PERMANENT PACEMAKER INSERTION N/A 08/05/2011   Procedure: PERMANENT PACEMAKER INSERTION;  Surgeon: Evans Lance, MD;  Location: [facility] CATH LAB;  Service: Cardiovascular;  Laterality: N/A;  . THYROIDECTOMY    . TONSILLECTOMY    . VALVE REPLACEMENT  2000        Home Medications    Prior to Admission medications   Medication Sig Start Date End Date Taking? Authorizing Provider  acetaminophen (TYLENOL) 500 MG tablet Take 1,000 mg by mouth every 6 (six) hours as needed (pain).    [provider]  amoxicillin (AMOXIL) 500 MG tablet Take 1 tablet (500 mg total) by mouth 2 (two) times daily. 08/09/17   Evans Lance, MD  furosemide (LASIX) 20 MG tablet TAKE 1 TABLET BY MOUTH DAILY AS NEEDED FOR WEIGHT OVER 205 07/11/18   Evans Lance, MD  gabapentin (NEURONTIN) 100 MG capsule Take 1 capsule by mouth 3 (three) times daily. 03/14/18   [provider]  levothyroxine (SYNTHROID) 50 MCG tablet Take 1 tablet (50 mcg total) by mouth daily before breakfast. 12/01/17   Evans Lance, MD  lisinopril (PRINIVIL,ZESTRIL) 10 MG tablet Take 10 mg by mouth at bedtime.  05/17/15   [provider]  Multiple Vitamin (MULTIVITAMIN WITH MINERALS) TABS tablet Take 1 tablet by mouth daily.    [provider]  warfarin (COUMADIN) 5 MG tablet TAKE AS DIRECTED BY COUMADIN CLINIC 09/26/18   Belva Crome, MD    Family History Family History  Problem Relation Age of Onset  . Heart disease Mother   . Heart failure Mother   . Heart disease Father   . Heart failure Father     Social History Social History   Tobacco Use  . Smoking status: Never Smoker  . Smokeless tobacco: Never Used  Substance  Use Topics  . Alcohol use: Yes    Alcohol/week: 0.0 standard drinks    Comment: occasionally  . Drug use: No     Allergies   Patient has no known allergies.   Review of Systems Review of Systems 10 Systems reviewed and are negative for acute change except as noted in the HPI.   Physical Exam Updated Vital Signs BP 110/73 (BP Location: Right Arm)   Pulse 62   Resp 16   SpO2 97%   Physical Exam Constitutional:      Appearance: Normal appearance.     Comments: Patient is alert and appropriate.  Color is good.  No respiratory distress.  Mental status clear.  HENT:     Head: Normocephalic and atraumatic.     Mouth/Throat:     Pharynx: Oropharynx is clear.  Cardiovascular:     Rate and Rhythm: Tachycardia present. Rhythm irregular.  Pulmonary:     Effort: Pulmonary effort is normal.     Breath sounds: Normal breath sounds.  Abdominal:  General: There is no distension.     Palpations: Abdomen is soft.     Tenderness: There is no abdominal tenderness. There is no guarding.  Musculoskeletal: Normal range of motion.     Comments: Lower extremity changes of chronic venous stasis with hyperpigmentation and skin thinning but no significant edema.  Skin:    General: Skin is warm and dry.  Neurological:     General: No focal deficit present.     Mental Status: He is alert and oriented to person, place, and time.     Coordination: Coordination normal.  Psychiatric:        Mood and Affect: Mood normal.      ED Treatments / Results  Labs (all labs ordered are listed, but only abnormal results are displayed) Labs Reviewed  CBC - Abnormal; Notable for the following components:      Result Value   WBC 12.0 (*)    All other components within normal limits  POCT I-STAT EG7 - Abnormal; Notable for the following components:   pO2, Ven 25.0 (*)    Sodium 133 (*)    All other components within normal limits  CBG MONITORING, ED - Abnormal; Notable for the following  components:   Glucose-Capillary 126 (*)    All other components within normal limits  BASIC METABOLIC PANEL  I-STAT TROPONIN, ED  I-STAT CREATININE, ED    EKG EKG Interpretation  Date/Time:  Thursday October 12 2018 12:50:25 EDT Ventricular Rate:  156 PR Interval:    QRS Duration: 161 QT Interval:  384 QTC Calculation: 438 R Axis:   -176 Text Interpretation:  Extreme tachycardia with wide complex, no further rhythm analysis attempted Artifact in lead(s) I III aVL and baseline wander in lead(s) V1 paroxysmal VT. change from previous Confirmed by Charlesetta Shanks [phone]) on 10/12/2018 1:26:23 PM   Radiology No results found.  Procedures Procedures (including critical care time) CRITICAL CARE Performed by: Charlesetta Shanks   Total critical care time: 30 minutes  Critical care time was exclusive of separately billable procedures and treating other patients.  Critical care was necessary to treat or prevent imminent or life-threatening deterioration.  Critical care was time spent personally by me on the following activities: development of treatment plan with patient and/or surrogate as well as nursing, discussions with consultants, evaluation of patient's response to treatment, examination of patient, obtaining history from patient or surrogate, ordering and performing treatments and interventions, ordering and review of laboratory studies, ordering and review of radiographic studies, pulse oximetry and re-evaluation of patient's condition. Medications Ordered in ED Medications  amiodarone (NEXTERONE PREMIX) 360-4.14 MG/200ML-% (1.8 mg/mL) IV infusion (60 mg/hr Intravenous Rate/Dose Change 10/12/18 1306)  amiodarone (NEXTERONE PREMIX) 360-4.14 MG/200ML-% (1.8 mg/mL) IV infusion (30 mg/hr Intravenous New Bag/Given 10/12/18 1259)  amiodarone (NEXTERONE) IV bolus only 150 mg/100 mL (has no administration in time range)  sodium chloride flush (NS) 0.9 % injection 3 mL (has no administration  in time range)     Initial Impression / Assessment and Plan / ED Course  I have reviewed the triage vital signs and the nursing notes.  Pertinent labs & imaging results that were available during my care of the patient were reviewed by me and considered in my medical decision making (see chart for details).  Clinical Course as of Oct 11 1337  Thu Oct 12, 2018  1301 Cardiology consulted.  Will see patient in the emergency department.   [MP]    Clinical Course User Index [MP] Pistol Kessenich,  Jeannie Done, MD      Patient presents with paroxysmal ventricular tachycardia.  Patient given amiodarone 150 mg bolus and drip shortly after arrival.  He has responded well with rate controlled atrial fibrillation in the 60s and 70s.  Patient feels improved.  Cardiology consulted and seen by Dr. Curt Bears in the emergency department.  Agree patient management.  Plan for admission.  Final Clinical Impressions(s) / ED Diagnoses   Final diagnoses:  Ventricular tachycardia (paroxysmal) ([place])  Severe comorbid illness    ED Discharge Orders    None       Charlesetta Shanks, MD 10/12/18 1340

## 2018-10-12 NOTE — ED Notes (Signed)
Amiodarone 150 mg bolus given over  4 mins with pt on zoll and cardiac monitor, MD at bedside who gave verbal order, pt placed on 2 L [place]

## 2018-10-12 NOTE — ED Triage Notes (Signed)
Pt endorses that his defibrillator fired twice pta while playing golf. Pt had some lightheadedness while on the first hole and it went away and then came back and that it when his defib fired. Called cardiologist Dr Crissie Sickles and came here. Axox4.

## 2018-10-12 NOTE — ED Notes (Signed)
ED TO INPATIENT HANDOFF REPORT  ED Nurse Name and Phone #: [facility] [phone]  S Name/Age/Gender Austin [name] 78 y.o. male Room/Bed: 015C/015C  Code Status   Code Status: Full Code  Home/SNF/Other Home Patient oriented to: self, place, time and situation Is this baseline? Yes   Triage Complete: Triage complete  Chief Complaint DEFIB FIRED TWICE  Triage Note Pt endorses that his defibrillator fired twice pta while playing golf. Pt had some lightheadedness while on the first hole and it went away and then came back and that it when his defib fired. Called cardiologist Dr Crissie Sickles and came here. Axox4.    Allergies No Known Allergies  Level of Care/Admitting Diagnosis ED Disposition    ED Disposition Condition Comment   Admit  Hospital Area: [place] [[id number]]  Level of Care: Progressive [102]  Diagnosis: VT (ventricular tachycardia) [facility]) [[id number]]  Admitting Physician: Constance Haw [[id number]]  Attending Physician: Evans Lance [[id number]]  Estimated length of stay: 3 - 4 days  Certification:: I certify this patient will need inpatient services for at least 2 midnights  PT Class (Do Not Modify): Inpatient [101]  PT Acc Code (Do Not Modify): Private [1]       B Medical/Surgery History Past Medical History:  Diagnosis Date  . Atrial fibrillation ([place])   . 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 Conductive hearing loss   . Intraspinal abscess   . 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 Nonischemic cardiomyopathy ([place])   . Psychosexual dysfunction with inhibited sexual excitement   . S/P mitral valve replacement   . Syncope and collapse   . Unspecified sleep apnea    last sleep study 11/07  . Ventricular tachycardia [facility])    Past Surgical History:  Procedure Laterality Date  . BIV ICD GENERATOR CHANGEOUT N/A  05/04/2017   Procedure: BiV ICD Generator Changeout;  Surgeon: Evans Lance, MD;  Location: [facility] CV LAB;  Service: Cardiovascular;  Laterality: N/A;  . CARDIAC CATHETERIZATION  04/24/2002  . CARDIAC VALVE REPLACEMENT    . CARDIOVERSION  03/09/2012   Procedure: CARDIOVERSION;  Surgeon: Evans Lance, MD;  Location: [place];  Service: Cardiovascular;  Laterality: N/A;  . CARDIOVERSION N/A 11/22/2013   Procedure: CARDIOVERSION;  Surgeon: Sanda Klein, MD;  Location: [facility] ENDOSCOPY;  Service: Cardiovascular;  Laterality: N/A;  . dental implants    . ELECTROPHYSIOLOGIC STUDY N/A 09/15/2015   Procedure: V Tach Ablation;  Surgeon: Evans Lance, MD;  Location: [place] CV LAB;  Service: Cardiovascular;  Laterality: N/A;  . Evacution of epidural lumbar epidural abscess  1999  . INSERT / REPLACE / REMOVE PACEMAKER  11/2008  . MITRAL VALVE REPLACEMENT     w #[facility]  . PERMANENT PACEMAKER INSERTION N/A 08/05/2011   Procedure: PERMANENT PACEMAKER INSERTION;  Surgeon: Evans Lance, MD;  Location: [facility] CATH LAB;  Service: Cardiovascular;  Laterality: N/A;  . THYROIDECTOMY    . TONSILLECTOMY    . VALVE REPLACEMENT  2000     A IV Location/Drains/Wounds Patient Lines/Drains/Airways Status   Active Line/Drains/Airways    Name:   Placement date:   Placement time:   Site:   Days:   Peripheral IV 10/12/18 Left Antecubital   10/12/18    1255    Antecubital   less than 1   Peripheral IV 10/12/18  Left Hand   10/12/18    1256    Hand   less than 1   Incision (Closed) 05/04/17 Chest Right   05/04/17    1038     526   Wound / Incision (Open or Dehisced) 08/22/15 Other (Comment) Foot Left;Right scab noted to the  top of second toe pt states this is from hammer toe   08/22/15    1700    Foot   1147          Intake/Output Last 24 hours No intake or output data in the 24 hours ending 10/12/18 1424  Labs/Imaging Results for orders placed or performed during the hospital encounter of  10/12/18 (from the past 48 hour(s))  I-stat troponin, ED     Status: None   Collection Time: 10/12/18 12:56 PM  Result Value Ref Range   Troponin i, poc 0.05 0.00 - 0.08 ng/mL   Comment 3            Comment: Due to the release kinetics of cTnI, a negative result within the first hours of the onset of symptoms does not rule out myocardial infarction with certainty. If myocardial infarction is still suspected, repeat the test at appropriate intervals.   POCT I-Stat EG7     Status: Abnormal   Collection Time: 10/12/18 12:58 PM  Result Value Ref Range   pH, Ven 7.335 7.250 - 7.430   pCO2, Ven 49.7 44.0 - 60.0 mmHg   pO2, Ven 25.0 (LL) 32.0 - 45.0 mmHg   Bicarbonate 26.5 20.0 - 28.0 mmol/L   TCO2 28 22 - 32 mmol/L   O2 Saturation 40.0 %   Sodium 133 (L) 135 - 145 mmol/L   Potassium 4.2 3.5 - 5.1 mmol/L   Calcium, Ion 1.15 1.15 - 1.40 mmol/L   HCT 46.0 39.0 - 52.0 %   Hemoglobin 15.6 13.0 - 17.0 g/dL   Patient temperature HIDE    Sample type VENOUS    Comment NOTIFIED PHYSICIAN   Basic metabolic panel     Status: Abnormal   Collection Time: 10/12/18  1:02 PM  Result Value Ref Range   Sodium 132 (L) 135 - 145 mmol/L   Potassium 4.2 3.5 - 5.1 mmol/L   Chloride 98 98 - 111 mmol/L   CO2 22 22 - 32 mmol/L   Glucose, Bld 143 (H) 70 - 99 mg/dL   BUN 25 (H) 8 - 23 mg/dL   Creatinine, Ser 1.17 0.61 - 1.24 mg/dL   Calcium 9.3 8.9 - 10.3 mg/dL   GFR calc non Af Amer >60 >60 mL/min   GFR calc Af Amer >60 >60 mL/min   Anion gap 12 5 - 15    Comment: Performed at [facility], [place] [address], [place], [place] [address]  CBC     Status: Abnormal   Collection Time: 10/12/18  1:02 PM  Result Value Ref Range   WBC 12.0 (H) 4.0 - 10.5 K/uL   RBC 4.93 4.22 - 5.81 MIL/uL   Hemoglobin 14.8 13.0 - 17.0 g/dL   HCT 46.2 39.0 - 52.0 %   MCV 93.7 80.0 - 100.0 fL   MCH 30.0 26.0 - 34.0 pg   MCHC 32.0 30.0 - 36.0 g/dL   RDW 15.3 11.5 - 15.5 %   Platelets 159 150 - 400 K/uL   nRBC 0.0 0.0  - 0.2 %    Comment: Performed at [facility], [place] [address] [place], [place] [address]  I-stat Creatinine, ED     Status: None   Collection Time: 10/12/18  1:13 PM  Result Value Ref Range   Creatinine, Ser 1.20 0.61 - 1.24 mg/dL  CBG monitoring, ED     Status: Abnormal   Collection Time: 10/12/18  1:17 PM  Result Value Ref Range   Glucose-Capillary 126 (H) 70 - 99 mg/dL   Dg Chest Portable 1 View  Result Date: 10/12/2018 CLINICAL DATA:  ICD discharge and lightheadedness. EXAM: PORTABLE CHEST 1 VIEW COMPARISON:  10/26/2015 FINDINGS: Biventricular ICD shows stable positioning. Stable heart size. Evidence of prior mitral valve repair/replacement. External pacing pads present. Lung volumes are low with mild bibasilar atelectasis present. There is no evidence of pulmonary edema, consolidation, pneumothorax, nodule or pleural fluid. IMPRESSION: Low bilateral lung volumes.  No acute findings. Electronically Signed   By: Aletta Edouard M.D.   On: 10/12/2018 13:45    Pending Labs Unresulted Labs (From admission, onward)    Start     Ordered   10/13/18 1856  Basic metabolic panel  Tomorrow morning,   R     10/12/18 1400   10/12/18 1413  Protime-INR  Add-on,   STAT     10/12/18 1412   10/12/18 1406  Magnesium  Add-on,   R     10/12/18 1405   10/12/18 1400  Protime-INR  ONCE - STAT,   STAT     10/12/18 1400          Vitals/Pain Today's Vitals   10/12/18 1312 10/12/18 1315 10/12/18 1345 10/12/18 1400  BP:  101/60 91/60 91/60   Pulse:  64 66 63  Resp:  17 18 16   SpO2:  95% 95% 96%  PainSc: 0-No pain       Isolation Precautions No active isolations  Medications Medications  amiodarone (NEXTERONE PREMIX) 360-4.14 MG/200ML-% (1.8 mg/mL) IV infusion (60 mg/hr Intravenous Rate/Dose Change 10/12/18 1306)  amiodarone (NEXTERONE PREMIX) 360-4.14 MG/200ML-% (1.8 mg/mL) IV infusion (30 mg/hr Intravenous New Bag/Given 10/12/18 1259)  amiodarone (NEXTERONE) IV bolus only 150 mg/100 mL  (has no administration in time range)  sodium chloride flush (NS) 0.9 % injection 3 mL (has no administration in time range)  aspirin EC tablet 81 mg (has no administration in time range)  nitroGLYCERIN (NITROSTAT) SL tablet 0.4 mg (has no administration in time range)  acetaminophen (TYLENOL) tablet 650 mg (has no administration in time range)  ondansetron (ZOFRAN) injection 4 mg (has no administration in time range)  sodium chloride 0.9 % bolus 500 mL (500 mLs Intravenous New Bag/Given 10/12/18 1347)    Mobility walks     Focused Assessments Cardiac Assessment Handoff:  Cardiac Rhythm: Ventricular tachycardia(intermittent runs of Vtach) Lab Results  Component Value Date   CKTOTAL 201 12/15/2016   TROPONINI 0.08 (H) 10/27/2015   No results found for: DDIMER Does the Patient currently have chest pain? No     R Recommendations: See Admitting Provider Note  Report given to:   Additional Notes:

## 2018-10-12 NOTE — Progress Notes (Signed)
Critical result - Troponin = 0.16. Rosaria Ferries, PA notified via Amion text messaging.

## 2018-10-12 NOTE — Telephone Encounter (Signed)
Late entry:  Spoke with patient while he was on his way to the ED. He reports he is feeling poorly, "headachy" since his ICD shocks. Advised as he is experiencing symptoms, he should continue on to the ED. Patient verbalizes understanding and agreement with plan.  Also spoke with patient's wife to update regarding recommendations.

## 2018-10-12 NOTE — H&P (Addendum)
Cardiology Admission History and Physical:   Patient ID: Austin [name] MRN: [id number]; DOB: 01-17-1941   Admission date: 10/12/2018  Primary Care Provider: Lajean Manes, [name] Primary Cardiologist: Austin Peru, [name]  Primary Electrophysiologist:  Dr. Lovena [name]  Chief Complaint:  ICD shocks  Patient Profile:   Austin [name] is a 78 y.o. male with PMHx of  NICM/CRT-D, chronic CHF VHD with MVR (mechanical), persistent AFib and VT.  Remote EPS in 2017 disclosed 4 different VTs, 2 were succesfuly ablated and felt to be his clinical VT.  Austin [name] wife reports a second ablation Feb 2019 in [place] and amiodarone discontinued after that.     History of Present Illness:   Austin [name], no trouble with ADL's, reports for some years "good days and bad days", though this seem to correlate to generally poor energy.  He denies any CP, SOB, no dizzy spells, near syncope or syncope.  Today he was golfing (on t he 3rd hole), became very weak, dizzy and shocked.  He thinks twice.  He had no syncope.  Initially felt OK, though as Austin morning has gone on, not feeling [name], vague feeling of "off", tired, intermittently dizzy.  He reached out to Austin office, was advised to call EMS, and was brought to [facility]. He denies any cough/cold symptoms, no SOB, no travel  Since here, dizzy moments have slowed, feels a bit better.  He was observed to have fairly frequent episodes of NSVT, given bolus amiodarone and started on gtt  Full labs are pending Istat K+ 4.2 Creat 1.20 poc Trop 0.05  WBC 12.0 H/H 14/46 Plts 159    Afib hx: DCCV 2013 and 2015  VT hx AAD therapy: Amiodarone stopped after last ablation Feb 2019 VT storm 08/22/15, 10/22/15, 10/29/15 VT ablation 09/15/15 ?Feb 2019 ablation in [facility] information: MDT dual chamber ICD, implanted 08/05/11, programmed VVI, gen change 05/04/2017 Original device extracted Nov 2011 and new device Jan 2013  Device interrogation  today Battery and lead status are stable VP 22.6% Programmed VVI 40 Today he had AFlutter associated with VT (dual tachycardia) ATP #1 seemed to break though quickly back into VT, ATP again broke only for a few seconds He had waveform suspended for 50seconds, > appears in/out VT, >> ATP  Accelerated VT to VF zone, ATP failed/shock broke for a few seconds, back to slower VT IN SINUS now and 2nd shock > SR, PVCs  Past Medical History:  Diagnosis Date  . Atrial fibrillation ([place])   . 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 Conductive hearing loss   . Intraspinal abscess   . 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 Nonischemic cardiomyopathy ([place])   . Psychosexual dysfunction with inhibited sexual excitement   . S/P mitral valve replacement   . Syncope and collapse   . Unspecified sleep apnea    last sleep study 11/07  . Ventricular tachycardia [facility])     Past Surgical History:  Procedure Laterality Date  . BIV ICD GENERATOR CHANGEOUT N/A 05/04/2017   Procedure: BiV ICD Generator Changeout;  Surgeon: Austin [name];  Location: [place] CV LAB;  Service: Cardiovascular;  Laterality: N/A;  . CARDIAC CATHETERIZATION  04/24/2002  . CARDIAC VALVE REPLACEMENT    . CARDIOVERSION  03/09/2012   Procedure: CARDIOVERSION;  Surgeon: Austin [name];  Location: [facility]  ENDOSCOPY;  Service: Cardiovascular;  Laterality: N/A;  . CARDIOVERSION N/A 11/22/2013   Procedure: CARDIOVERSION;  Surgeon: Austin Klein, [name];  Location: [facility] ENDOSCOPY;  Service: Cardiovascular;  Laterality: N/A;  . dental implants    . ELECTROPHYSIOLOGIC STUDY N/A 09/15/2015   Procedure: V Tach Ablation;  Surgeon: Austin [name];  Location: [place] CV LAB;  Service: Cardiovascular;  Laterality: N/A;  . Evacution of epidural lumbar epidural abscess  1999  . INSERT / REPLACE / REMOVE PACEMAKER   11/2008  . MITRAL VALVE REPLACEMENT     w #[facility]  . PERMANENT PACEMAKER INSERTION N/A 08/05/2011   Procedure: PERMANENT PACEMAKER INSERTION;  Surgeon: Austin [name];  Location: [facility] CATH LAB;  Service: Cardiovascular;  Laterality: N/A;  . THYROIDECTOMY    . TONSILLECTOMY    . VALVE REPLACEMENT  2000     Medications Prior to Admission: Prior to Admission medications   Medication Sig Start Date End Date Taking? Authorizing Provider  acetaminophen (TYLENOL) 500 MG tablet Take 1,000 mg by mouth every 6 (six) hours as needed (pain).    Provider, Historical, [name]  amoxicillin (AMOXIL) 500 MG tablet Take 1 tablet (500 mg total) by mouth 2 (two) times daily. 08/09/17   Austin [name]  furosemide (LASIX) 20 MG tablet TAKE 1 TABLET BY MOUTH DAILY AS NEEDED FOR WEIGHT OVER 205 07/11/18   Austin [name]  gabapentin (NEURONTIN) 100 MG capsule Take 1 capsule by mouth 3 (three) times daily. 03/14/18   Provider, Historical, [name]  levothyroxine (SYNTHROID) 50 MCG tablet Take 1 tablet (50 mcg total) by mouth daily before breakfast. 12/01/17   Austin [name]  lisinopril (PRINIVIL,ZESTRIL) 10 MG tablet Take 10 mg by mouth at bedtime.  05/17/15   Provider, Historical, [name]  Multiple Vitamin (MULTIVITAMIN WITH MINERALS) TABS tablet Take 1 tablet by mouth daily.    Provider, Historical, [name]  warfarin (COUMADIN) 5 MG tablet TAKE AS DIRECTED BY COUMADIN CLINIC 09/26/18   Austin Crome, [name]     Allergies:   No Known Allergies  Social History:   Social History   Socioeconomic History  . Marital status: Married    Spouse name: Not on file  . Number of children: 1  . Years of education: 24  . Highest education level: Master's degree (e.g., MA, MS, MEng, MEd, MSW, MBA)  Occupational History  . Occupation: retired from Engineer, manufacturing systems  Social Needs  . Financial resource strain: Not on file  . Food insecurity:    Worry: Not on file    Inability: Not on file  . Transportation needs:    Medical:  Not on file    Non-medical: Not on file  Tobacco Use  . Smoking status: Never Smoker  . Smokeless tobacco: Never Used  Substance and Sexual Activity  . Alcohol use: Yes    Alcohol/week: 0.0 standard drinks    Comment: occasionally  . Drug use: No  . Sexual activity: Not on file  Lifestyle  . Physical activity:    Days per week: Not on file    Minutes per session: Not on file  . Stress: Not on file  Relationships  . Social connections:    Talks on phone: Not on file    Gets together: Not on file    Attends religious service: Not on file    Active member of club or organization: Not on file    Attends meetings of clubs or organizations: Not on  file    Relationship status: Not on file  . Intimate partner violence:    Fear of current or ex partner: Not on file    Emotionally abused: Not on file    Physically abused: Not on file    Forced sexual activity: Not on file  Other Topics Concern  . Not on file  Social History Narrative   Lives with wife in a one story home.  Has one daughter.  Retired.  Education: Masters.    Family History:   Austin [name] family history includes Heart disease in his father and mother; Heart failure in his father and mother.    ROS:  Please see Austin history of present illness.  All other ROS reviewed and negative.     Physical Exam/Data:   Vitals:   10/12/18 1300 10/12/18 1303  BP: 110/73 110/73  Pulse: (!) 40 62  Resp: (!) 24 16  SpO2: 96% 97%   No intake or output data in Austin 24 hours ending 10/12/18 1327 Last 3 Weights 09/26/2018 08/25/2018 10/25/2017  Weight (lbs) 203 lb 208 lb 6 oz 203 lb  Weight (kg) 92.08 kg 94.518 kg 92.08 kg     There is no height or weight on file to calculate BMI.  General:  [name] nourished, [name] developed, in no acute distress HEENT: normal Lymph: no adenopathy Neck: no JVD Endocrine:  No thryomegaly Vascular: No carotid bruits  Cardiac:  RRR; no appreciated murmurs, no gallops or rubs Lungs:  CTA b/l, no  wheezing, rhonchi or rales  Abd: soft, nontender Ext: no edema Musculoskeletal:  No deformities Skin: warm and dry  Neuro:   No gross focal abnormalities noted Psych:  Normal affect    EKG:  Austin ECG that was done today was personally reviewed and demonstrates  Looks SR, NSVT episodes  Relevant CV Studies:  08/23/15: Echocardiogram Study Conclusions - Left ventricle: Austin cavity size was moderately dilated. Wall  thickness was normal. Systolic function was moderately to  severely reduced. Austin estimated ejection fraction was in Austin  range of 30% to 35%. Diffuse hypokinesis. Austin study is not  technically sufficient to allow evaluation of LV diastolic  function. - Aortic valve: Trileaflet. Sclerosis without stenosis. There was  no regurgitation. - Mitral valve: Tilting bileaflet mechanical valve without  obstruction. Normal bileaflet motion. Trivial regurgitation. Mean  gradient (D): 4 mm Hg. Valve area by pressure half-time: 1.85  cm^2. Valve area by continuity equation (using LVOT flow): 1.54  cm^2. - Left atrium: Severely dilated at 58 ml/m2. - Right ventricle: Austin cavity size was moderately dilated. Systolic  function is reduced. Lateral annulus peak S velocity: 8.5 cm/s. - Right atrium: Severely dilated at 29 cm2. - Tricuspid valve: There was mild regurgitation. - Pulmonary arteries: PA peak pressure: 32 mm Hg (S) + RAP. - Systemic veins: IVC not visualized. Impressions: - Compared to a prior study in 2012, Austin EF is lower at 30-35% -  moderate dilation with normal wall thickness and global  hypokinesis. Austin RV is also dilated with reduced systolic  function. Austin mechanical mitral valve is functioning normally  without obstruction. There is severe biatrial enlargement.  T 09/15/15: VT ablation, Dr. Lovena [name] Conclusion: Successful catheter ablation of 2 distinct ventricular tachycardias originating from Austin mitral valve annulus which appeared to correlate  with Austin [name] clinical ventricular tachycardia. Unsuccessful ablation of Austin inducible ventricular tachycardia originating from Austin septal portion of Austin mitral valve annulus as previously noted above.    Laboratory Data:  Chemistry Recent Labs  Lab 10/12/18 1258 10/12/18 1313  NA 133*  --   K 4.2  --   CREATININE  --  1.20    No results for input(s): PROT, ALBUMIN, AST, ALT, ALKPHOS, BILITOT in Austin last 168 hours. Hematology Recent Labs  Lab 10/12/18 1258  HGB 15.6  HCT 46.0   Cardiac EnzymesNo results for input(s): TROPONINI in Austin last 168 hours.  Recent Labs  Lab 10/12/18 1256  TROPIPOC 0.05    BNPNo results for input(s): BNP, PROBNP in Austin last 168 hours.  DDimer No results for input(s): DDIMER in Austin last 168 hours.  Radiology/Studies:  No results found.  Assessment and Plan:   1. VT storm 2. A number of failed ATPs, 2 shocks     No syncope     agree with/continue amiodarone gtt  No hx of CAD, though looks like has been some years Dr. Curt Bears Brinly Maietta discuss with Dr. Lovena [name], may benefit from cath/ischemic w/u INR is pending, Austin [name] plan to use heparin gtt if/once INR <2.0 with thoughts towards possible cath  3. Paroxysmal AFib/flutter     CHAD2S2Vasc is 3 4. Mechanical MVR     On warfarin     INR pending  5. NICM     OptiVol looks OK, he reports up a couple pounds this morning     LR with some edema     He is on ACE, looks like BB have been stopped historically 2/2 fatigue, also note he does some VP (22%)    Severity of Illness: Austin appropriate patient status for this patient is INPATIENT. Inpatient status is judged to be reasonable and necessary in order to provide Austin required intensity of service to ensure Austin [name] safety. Austin [name] presenting symptoms, physical exam findings, and initial radiographic and laboratory data in Austin context of their chronic comorbidities is felt to place them at high risk for further clinical deterioration.  Furthermore, it is not anticipated that Austin patient Austin [name] be medically stable for discharge from Austin hospital within 2 midnights of admission. Austin following factors support Austin patient status of inpatient.   " Austin [name] presenting symptoms include VT. " Austin worrisome physical exam findings include VT " Austin chronic co-morbidities include NICM.   * I certify that at Austin point of admission it is my clinical judgment that Austin patient Austin [name] require inpatient hospital care spanning beyond 2 midnights from Austin point of admission due to high intensity of service, high risk for further deterioration and high frequency of surveillance required.*    For questions or updates, please contact [place] Please consult [email or web address] for contact info under        Signed, Baldwin Jamaica, PA-C  10/12/2018 1:27 PM   I have seen and examined this patient with Tommye Standard.  Agree with above, note added to reflect my findings.  On exam, irregular, no murmurs, lungs clear, 1+edema.  Patient admitted with ICD shocks x2. Patient was on [place] golf [place]. Felt weak and dizzy prior to shocks. Interrogation shows 2 shocks with multiple ATP episodes. Patient continues to feel weak and fatigued. Has had VT ablation x2. Plan to start amiodarone IV. Jawuan Robb likely transition to PO amiodarone. Milli [place] update TTE. Has had multiple VT morphologies noted on interrogation.  Taia Bramlett M. Giamarie Bueche [name] 10/12/2018 2:33 PM

## 2018-10-12 NOTE — Telephone Encounter (Signed)
Patient called and stated that he was playing golf an his device shocked him twice. He said it was back to back. Pt states that he feels fine now but prior to the therapies he was a little dizzy. I instructed pt that our shock plan states that if a patient receives more than 1 shock no matter how they feel they need to go to [facility]. I informed pt not to drive himself and I recommenced that he call 9-1-1. Pt stated that he had someone to drive him to the hospital.

## 2018-10-12 NOTE — ED Notes (Signed)
This RN spoke with the Main lab who verbalizes they will add on PT/INR

## 2018-10-12 NOTE — ED Notes (Signed)
Pt placed on Amiodarone drip with pharmacist verbalizing rate for medication, Amiodarone on Alaris pump started by Norva Pavlov, RN, Pharmacist to place order in Epic

## 2018-10-12 NOTE — ED Notes (Signed)
Cardiologist at bedside interpreting defib/demand Pacemaker

## 2018-10-12 NOTE — ED Notes (Addendum)
Pt's BP 90/45, Dr Johnney Killian notified and ordered 555ml NS bolus. Started by this RN. Pt stating that he feels better now since being on amio drip.

## 2018-10-13 ENCOUNTER — Inpatient Hospital Stay (HOSPITAL_COMMUNITY): Payer: Medicare Other

## 2018-10-13 DIAGNOSIS — I472 Ventricular tachycardia: Principal | ICD-10-CM

## 2018-10-13 DIAGNOSIS — I361 Nonrheumatic tricuspid (valve) insufficiency: Secondary | ICD-10-CM

## 2018-10-13 LAB — COMPREHENSIVE METABOLIC PANEL
ALBUMIN: 3.2 g/dL — AB (ref 3.5–5.0)
ALT: 18 U/L (ref 0–44)
AST: 26 U/L (ref 15–41)
Alkaline Phosphatase: 54 U/L (ref 38–126)
Anion gap: 7 (ref 5–15)
BUN: 18 mg/dL (ref 8–23)
CHLORIDE: 107 mmol/L (ref 98–111)
CO2: 25 mmol/L (ref 22–32)
Calcium: 8.9 mg/dL (ref 8.9–10.3)
Creatinine, Ser: 1.02 mg/dL (ref 0.61–1.24)
GFR calc Af Amer: >60 mL/min (ref >60)
GFR calc non Af Amer: >60 mL/min (ref >60)
Glucose, Bld: 86 mg/dL (ref 70–99)
Potassium: 3.9 mmol/L (ref 3.5–5.1)
Sodium: 139 mmol/L (ref 135–145)
Total Bilirubin: 0.9 mg/dL (ref 0.3–1.2)
Total Protein: 6 g/dL — ABNORMAL LOW (ref 6.5–8.1)

## 2018-10-13 LAB — BASIC METABOLIC PANEL
Anion gap: 4 — ABNORMAL LOW (ref 5–15)
BUN: 18 mg/dL (ref 8–23)
CO2: 27 mmol/L (ref 22–32)
Calcium: 8.7 mg/dL — ABNORMAL LOW (ref 8.9–10.3)
Chloride: 106 mmol/L (ref 98–111)
Creatinine, Ser: 1.02 mg/dL (ref 0.61–1.24)
GFR calc Af Amer: >60 mL/min (ref >60)
GFR calc non Af Amer: >60 mL/min (ref >60)
Glucose, Bld: 82 mg/dL (ref 70–99)
Potassium: 3.9 mmol/L (ref 3.5–5.1)
Sodium: 137 mmol/L (ref 135–145)

## 2018-10-13 LAB — ECHOCARDIOGRAM COMPLETE
Height: 73 in
WEIGHTICAEL: 3252.8 [oz_av]

## 2018-10-13 LAB — TROPONIN I: Troponin I: 0.24 ng/mL (ref <0.03)

## 2018-10-13 LAB — PROTIME-INR
INR: 2.9 — ABNORMAL HIGH (ref 0.8–1.2)
Prothrombin Time: 29.7 seconds — ABNORMAL HIGH (ref 11.4–15.2)

## 2018-10-13 MED ORDER — WARFARIN - PHARMACIST DOSING INPATIENT
Freq: Every day | Status: DC
  Administered 2018-10-13: 20:00:00

## 2018-10-13 MED ORDER — WARFARIN SODIUM 7.5 MG PO TABS
7.5000 mg | ORAL_TABLET | Freq: Once | ORAL | Status: AC
  Administered 2018-10-13: 7.5 mg via ORAL
  Filled 2018-10-13: qty 1

## 2018-10-13 MED ORDER — AMIODARONE HCL 200 MG PO TABS
200.0000 mg | ORAL_TABLET | Freq: Two times a day (BID) | ORAL | Status: DC
  Administered 2018-10-13 – 2018-10-14 (×2): 200 mg via ORAL
  Filled 2018-10-13 (×3): qty 1

## 2018-10-13 NOTE — Progress Notes (Signed)
  Echocardiogram 2D Echocardiogram has been performed.  Austin [name] 10/13/2018, 9:28 AM

## 2018-10-13 NOTE — Progress Notes (Addendum)
Progress Note  Patient Name: Austin [name] Date of Encounter: 10/13/2018  Primary Cardiologist: [name], MD   Subjective   No complaints  Inpatient Medications    Scheduled Meds: . aspirin EC  81 mg Oral Daily  . furosemide  20 mg Oral Daily  . gabapentin  100 mg Oral TID  . levothyroxine  50 mcg Oral QAC breakfast  . lisinopril  10 mg Oral QHS  . warfarin  7.5 mg Oral ONCE-1800  . Warfarin - Pharmacist Dosing Inpatient   Does not apply q1800   Continuous Infusions: . amiodarone 30 mg/hr (10/13/18 0635)  . amiodarone     PRN Meds: acetaminophen, nitroGLYCERIN, ondansetron (ZOFRAN) IV   Vital Signs    Vitals:   10/12/18 1949 10/12/18 2245 10/12/18 2356 10/13/18 0400  BP: (!) 99/56 135/81 111/67 (!) 99/58  Pulse: 84  (!) 54 (!) 44  Resp:      Temp: 98.5 F (36.9 C)  97.9 F (36.6 C) 98 F (36.7 C)  TempSrc:      SpO2: 96%  95% 93%  Weight:    92.2 kg  Height:        Intake/Output Summary (Last 24 hours) at 10/13/2018 0912 Last data filed at 10/13/2018 0531 Gross per 24 hour  Intake 376.6 ml  Output 780 ml  Net -403.4 ml   Last 3 Weights 10/13/2018 10/12/2018 09/26/2018  Weight (lbs) 203 lb 4.8 oz 208 lb 14.4 oz 203 lb  Weight (kg) 92.216 kg 94.756 kg 92.08 kg      Telemetry    SR, long 1st degree block,  intermittent V pacing, occ PVCs, one NSVT 5 beats - Personally Reviewed  ECG    No new EKGs - Personally Reviewed  Physical Exam   GEN: No acute distress.   Neck: No JVD Cardiac: RRR, no murmurs, rubs, or gallops.  Respiratory: CTA b/l. GI: Soft, nontender  MS: chronic looking skin changes, trace edema; No deformity. Neuro:  Nonfocal  Psych: Normal affect   Labs    Chemistry Recent Labs  Lab 10/12/18 1258 10/12/18 1302 10/12/18 1313 10/13/18 0254  NA 133* 132*  --  137  K 4.2 4.2  --  3.9  CL  --  98  --  106  CO2  --  22  --  27  GLUCOSE  --  143*  --  82  BUN  --  25*  --  18  CREATININE  --  1.17 1.20 1.02  CALCIUM  --   9.3  --  8.7*  GFRNONAA  --  >60  --  >60  GFRAA  --  >60  --  >60  ANIONGAP  --  12  --  4*     Hematology Recent Labs  Lab 10/12/18 1258 10/12/18 1302  WBC  --  12.0*  RBC  --  4.93  HGB 15.6 14.8  HCT 46.0 46.2  MCV  --  93.7  MCH  --  30.0  MCHC  --  32.0  RDW  --  15.3  PLT  --  159    Cardiac Enzymes Recent Labs  Lab 10/12/18 1642 10/12/18 2132 10/13/18 0254  TROPONINI 0.16* 0.23* 0.24*    Recent Labs  Lab 10/12/18 1256  TROPIPOC 0.05     BNPNo results for input(s): BNP, PROBNP in the last 168 hours.   DDimer No results for input(s): DDIMER in the last 168 hours.   Radiology    Dg  Chest Portable 1 View Result Date: 10/12/2018 CLINICAL DATA:  ICD discharge and lightheadedness. EXAM: PORTABLE CHEST 1 VIEW COMPARISON:  10/26/2015 FINDINGS: Biventricular ICD shows stable positioning. Stable heart size. Evidence of prior mitral valve repair/replacement. External pacing pads present. Lung volumes are low with mild bibasilar atelectasis present. There is no evidence of pulmonary edema, consolidation, pneumothorax, nodule or pleural fluid. IMPRESSION: Low bilateral lung volumes.  No acute findings. Electronically Signed   By: Aletta Edouard M.D.   On: 10/12/2018 13:45    Cardiac Studies   08/23/15: Echocardiogram Study Conclusions - Left ventricle: The cavity size was moderately dilated. Wall  thickness was normal. Systolic function was moderately to  severely reduced. The estimated ejection fraction was in the  range of30% to 35%. Diffuse hypokinesis. The study is not  technically sufficient to allow evaluation of LV diastolic  function. - Aortic valve: Trileaflet. Sclerosis without stenosis. There was  no regurgitation. - Mitral valve: Tilting bileaflet mechanical valve without  obstruction. Normal bileaflet motion. Trivial regurgitation. Mean  gradient (D): 4 mm Hg. Valve area by pressure half-time: 1.85  cm^2. Valve area by continuity  equation (using LVOT flow): 1.54  cm^2. - Left atrium: Severely dilated at 58 ml/m2. - Right ventricle: The cavity size was moderately dilated. Systolic  function is reduced. Lateral annulus peak S velocity: 8.5 cm/s. - Right atrium: Severely dilated at 29 cm2. - Tricuspid valve: There was mild regurgitation. - Pulmonary arteries: PA peak pressure: 32 mm Hg (S) + RAP. - Systemic veins: IVC not visualized. Impressions: - Compared to a prior study in 2012, the EF is lower at 30-35% -  moderate dilation with normal wall thickness and global  hypokinesis. The RV is also dilated with reduced systolic  function. The mechanical mitral valve is functioning normally  without obstruction. There is severe biatrial enlargement.  T 09/15/15: VT ablation, Dr. Lovena Le Conclusion:Successful catheter ablation of 2 distinct ventricular tachycardias originating from the mitral valve annulus which appeared to correlate with the patient's clinical ventricular tachycardia. Unsuccessful ablation of the inducible ventricular tachycardia originating from the septal portion of the mitral valve annulus as previously noted above.    Patient Profile     78 y.o. male with PMHx of NICM/CRT-D, chronic CHF VHD with MVR (mechanical), persistent AFib and VT.  Remote EPS in 2017 disclosed 4 different VTs, 2 were succesfuly ablated and felt to be his clinical VT.  The patient's underwent epicardial EPS/ablation Feb 2019 in [place] and amiodarone discontinued after that.     Assessment & Plan    1. VT storm 2. A number of failed ATPs, 2 shocks     No syncope     telemetry with only one NSVT     amio gtt to finish 24 hours and >> PO today, anticipate discharge tomorrow if rhythm is OK on PO amio  No hx of CAD, though looks like has been some years Discussed with Dr. Lovena Le.  No high suspicion of ischemic disease, planned to follow CE.  No real enzyme bump, will not plan to pursue cath/ischemic w/u  SR long  1st degree with some intermittent V pacing  3. Permanent AFib/flutter     CHAD2S2Vasc is 3 4. Mechanical MVR     On warfarin     INR today is 2.9, consulted pharmacy for management, OK to continue warfarin, no procedures planned at this time  5. NICM     OptiVol looks OK, CXR was OK     LR with  some edema     Low dose PO lasix, BUN/Creat down from yesterday     He is on ACE, looks like BB have been stopped historically 2/2 fatigue, also note he does some VP (22%)   For questions or updates, please contact [place] Please consult [email or web address] for contact info under     Signed, Baldwin Jamaica, PA-C  10/13/2018, 9:12 AM    EP Attending  Patient seen and examined. Agree with above. The patient has done well over night. He has had no additional VT. His IV amiodarone will be transitioned to po and I would anticipate he be allowed to be discharged tomorrow on amio 200 bid for a month then 200 mg daily. I will see him back in 4-5 weeks pending how the pandemic is going. No indication for left heart cath.   Mikle Bosworth.D.

## 2018-10-13 NOTE — Progress Notes (Signed)
ANTICOAGULATION CONSULT NOTE - Initial Consult  Pharmacy Consult for Coumadin Indication: MVR/Afib  No Known Allergies  Patient Measurements: Height: 6\' 1"  (185.4 cm) Weight: 203 lb 4.8 oz (92.2 kg) IBW/kg (Calculated) : 79.9  Vital Signs: Temp: 98 F (36.7 C) (03/20 0400) BP: 99/58 (03/20 0400) Pulse Rate: 44 (03/20 0400)  Labs: Recent Labs    10/12/18 1258 10/12/18 1302 10/12/18 1313 10/12/18 1642 10/12/18 2132 10/13/18 0254  HGB 15.6 14.8  --   --   --   --   HCT 46.0 46.2  --   --   --   --   PLT  --  159  --   --   --   --   LABPROT  --  30.2*  --   --   --  29.7*  INR  --  2.9*  --   --   --  2.9*  CREATININE  --  1.17 1.20  --   --  1.02  TROPONINI  --   --   --  0.16* 0.23* 0.24*    Estimated Creatinine Clearance: 70.7 mL/min (by C-G formula based on SCr of 1.02 mg/dL).   Medical History: Past Medical History:  Diagnosis Date  . AICD (automatic cardioverter/defibrillator) present   . Atrial fibrillation ([place])   . 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 Conductive hearing loss   . Intraspinal abscess   . 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 Nonischemic cardiomyopathy ([place])   . Psychosexual dysfunction with inhibited sexual excitement   . S/P mitral valve replacement   . Syncope and collapse   . Unspecified sleep apnea    last sleep study 11/07  . Ventricular tachycardia ([place])   . VT (ventricular tachycardia) ([facility])     Medications:  Medications Prior to Admission  Medication Sig Dispense Refill Last Dose  . acetaminophen (TYLENOL) 500 MG tablet Take 1,000 mg by mouth every 6 (six) hours as needed (pain).   10/12/2018 at prn  . furosemide (LASIX) 20 MG tablet TAKE 1 TABLET BY MOUTH DAILY AS NEEDED FOR WEIGHT OVER 205 (Patient taking differently: Take 20 mg by mouth as needed for fluid. ) 90 tablet 2 Past Month at prn  .  gabapentin (NEURONTIN) 100 MG capsule Take 1 capsule by mouth 3 (three) times daily.  0 10/12/2018 at Unknown time  . levothyroxine (SYNTHROID) 50 MCG tablet Take 1 tablet (50 mcg total) by mouth daily before breakfast. 90 tablet 3 10/12/2018 at 0730  . lisinopril (PRINIVIL,ZESTRIL) 10 MG tablet Take 10 mg by mouth at bedtime.   9 10/11/2018 at Unknown time  . Multiple Vitamin (MULTIVITAMIN WITH MINERALS) TABS tablet Take 1 tablet by mouth daily.   10/12/2018 at Unknown time  . warfarin (COUMADIN) 5 MG tablet TAKE AS DIRECTED BY COUMADIN CLINIC (Patient taking differently: Take 5 mg by mouth one time only at 6 PM. 7.5 mg on Tues, thurs and sat All the other days take 5 mg) 110 tablet 1 10/11/2018 at Unknown time  . amoxicillin (AMOXIL) 500 MG tablet Take 1 tablet (500 mg total) by mouth 2 (two) times daily. (Patient not taking: Reported on 10/12/2018) 90 tablet 0 Not Taking at Unknown time   Scheduled:  . aspirin EC  81 mg Oral Daily  . furosemide  20 mg Oral Daily  . gabapentin  100 mg Oral TID  .  levothyroxine  50 mcg Oral QAC breakfast  . lisinopril  10 mg Oral QHS   Infusions:  . amiodarone 30 mg/hr (10/13/18 0635)  . amiodarone      Assessment: 78yo male admitted after ICD shocks >> interrogation reveals multiple ATP episodes, now to resume Coumadin for MVR and Afib; of note amio was stopped as outpt in Feb 2019 and now to resume which will likely affect INR; current INR slightly below goal w/ last dose of Coumadin taken 3/18.  Goal of Therapy:  INR 3.0-3.5   Plan:  Will give Coumadin 7.5mg  PO x1 today and monitor INR for dose adjustments.  Wynona Neat, PharmD, BCPS  10/13/2018,7:08 AM

## 2018-10-14 DIAGNOSIS — Z7901 Long term (current) use of anticoagulants: Secondary | ICD-10-CM

## 2018-10-14 LAB — MAGNESIUM: Magnesium: 2.3 mg/dL (ref 1.7–2.4)

## 2018-10-14 LAB — PROTIME-INR
INR: 2.1 — ABNORMAL HIGH (ref 0.8–1.2)
PROTHROMBIN TIME: 23.6 s — AB (ref 11.4–15.2)

## 2018-10-14 MED ORDER — NITROGLYCERIN 0.4 MG SL SUBL: 0.4000 mg | SUBLINGUAL_TABLET | SUBLINGUAL | 2 refills | Status: DC | PRN

## 2018-10-14 MED ORDER — AMIODARONE HCL 200 MG PO TABS: 200.0000 mg | ORAL_TABLET | Freq: Two times a day (BID) | ORAL | 0 refills | Status: DC

## 2018-10-14 MED ORDER — AMIODARONE HCL 200 MG PO TABS: 200.0000 mg | ORAL_TABLET | Freq: Two times a day (BID) | ORAL | Status: DC

## 2018-10-14 MED ORDER — AMIODARONE HCL 200 MG PO TABS: 200.0000 mg | ORAL_TABLET | Freq: Every day | ORAL | 3 refills | Status: DC

## 2018-10-14 MED ORDER — WARFARIN SODIUM 7.5 MG PO TABS: 8.5000 mg | ORAL_TABLET | Freq: Once | ORAL | Status: DC

## 2018-10-14 MED ORDER — AMIODARONE HCL 200 MG PO TABS: 200.0000 mg | ORAL_TABLET | Freq: Every day | ORAL | Status: DC

## 2018-10-14 NOTE — Discharge Instructions (Signed)
Amiodarone 200 mg twice a day x 30 days, then 200 mg daily.

## 2018-10-14 NOTE — Progress Notes (Signed)
Progress Note  Patient Name: Austin [name] Date of Encounter: 10/14/2018  Primary Cardiologist: [name], MD   Subjective   Feeling well without major complaints.  Amiodarone does make him feel weak and fatigued.  Inpatient Medications    Scheduled Meds: . amiodarone  200 mg Oral BID  . gabapentin  100 mg Oral TID  . levothyroxine  50 mcg Oral QAC breakfast  . lisinopril  10 mg Oral QHS  . Warfarin - Pharmacist Dosing Inpatient   Does not apply q1800   Continuous Infusions:  PRN Meds: acetaminophen, nitroGLYCERIN, ondansetron (ZOFRAN) IV   Vital Signs    Vitals:   10/13/18 1618 10/13/18 2028 10/14/18 0015 10/14/18 0420  BP: 118/67 118/73 131/71 119/70  Pulse: (!) 49 (!) 56 62 62  Resp: 18 16 20 14   Temp: 98.3 F (36.8 C) (!) 97.5 F (36.4 C) 97.6 F (36.4 C) (!) 97.5 F (36.4 C)  TempSrc: Oral Oral Oral Oral  SpO2: 97% 98% 95% 93%  Weight:    91.7 kg  Height:        Intake/Output Summary (Last 24 hours) at 10/14/2018 1008 Last data filed at 10/14/2018 0842 Gross per 24 hour  Intake 600 ml  Output -  Net 600 ml   Last 3 Weights 10/14/2018 10/13/2018 10/12/2018  Weight (lbs) 202 lb 1.6 oz 203 lb 4.8 oz 208 lb 14.4 oz  Weight (kg) 91.672 kg 92.216 kg 94.756 kg      Telemetry    Sinus rhythm, first-degree AV block, intermittent ventricular pacing, intermittent PVCs- Personally Reviewed  ECG    None new- Personally Reviewed  Physical Exam   GEN: Well nourished, well developed, in no acute distress  HEENT: normal  Neck: no JVD, carotid bruits, or masses Cardiac: RRR; no murmurs, rubs, or gallops,no edema  Respiratory:  clear to auscultation bilaterally, normal work of breathing GI: soft, nontender, nondistended, + BS MS: no deformity or atrophy  Skin: warm and dry, device site well healed Neuro:  Strength and sensation are intact Psych: euthymic mood, full affect   Labs    Chemistry Recent Labs  Lab 10/12/18 1302 10/12/18 1313  10/13/18 0254 10/13/18 0255  NA 132*  --  137 139  K 4.2  --  3.9 3.9  CL 98  --  106 107  CO2 22  --  27 25  GLUCOSE 143*  --  82 86  BUN 25*  --  18 18  CREATININE 1.17 1.20 1.02 1.02  CALCIUM 9.3  --  8.7* 8.9  PROT  --   --   --  6.0*  ALBUMIN  --   --   --  3.2*  AST  --   --   --  26  ALT  --   --   --  18  ALKPHOS  --   --   --  54  BILITOT  --   --   --  0.9  GFRNONAA >60  --  >60 >60  GFRAA >60  --  >60 >60  ANIONGAP 12  --  4* 7     Hematology Recent Labs  Lab 10/12/18 1258 10/12/18 1302  WBC  --  12.0*  RBC  --  4.93  HGB 15.6 14.8  HCT 46.0 46.2  MCV  --  93.7  MCH  --  30.0  MCHC  --  32.0  RDW  --  15.3  PLT  --  159  Cardiac Enzymes Recent Labs  Lab 10/12/18 1642 10/12/18 2132 10/13/18 0254  TROPONINI 0.16* 0.23* 0.24*    Recent Labs  Lab 10/12/18 1256  TROPIPOC 0.05     BNPNo results for input(s): BNP, PROBNP in the last 168 hours.   DDimer No results for input(s): DDIMER in the last 168 hours.   Radiology    Dg Chest Portable 1 View Result Date: 10/12/2018 CLINICAL DATA:  ICD discharge and lightheadedness. EXAM: PORTABLE CHEST 1 VIEW COMPARISON:  10/26/2015 FINDINGS: Biventricular ICD shows stable positioning. Stable heart size. Evidence of prior mitral valve repair/replacement. External pacing pads present. Lung volumes are low with mild bibasilar atelectasis present. There is no evidence of pulmonary edema, consolidation, pneumothorax, nodule or pleural fluid. IMPRESSION: Low bilateral lung volumes.  No acute findings. Electronically Signed   By: Aletta Edouard M.D.   On: 10/12/2018 13:45    Cardiac Studies   08/23/15: Echocardiogram Study Conclusions - Left ventricle: The cavity size was moderately dilated. Wall  thickness was normal. Systolic function was moderately to  severely reduced. The estimated ejection fraction was in the  range of30% to 35%. Diffuse hypokinesis. The study is not  technically sufficient to  allow evaluation of LV diastolic  function. - Aortic valve: Trileaflet. Sclerosis without stenosis. There was  no regurgitation. - Mitral valve: Tilting bileaflet mechanical valve without  obstruction. Normal bileaflet motion. Trivial regurgitation. Mean  gradient (D): 4 mm Hg. Valve area by pressure half-time: 1.85  cm^2. Valve area by continuity equation (using LVOT flow): 1.54  cm^2. - Left atrium: Severely dilated at 58 ml/m2. - Right ventricle: The cavity size was moderately dilated. Systolic  function is reduced. Lateral annulus peak S velocity: 8.5 cm/s. - Right atrium: Severely dilated at 29 cm2. - Tricuspid valve: There was mild regurgitation. - Pulmonary arteries: PA peak pressure: 32 mm Hg (S) + RAP. - Systemic veins: IVC not visualized. Impressions: - Compared to a prior study in 2012, the EF is lower at 30-35% -  moderate dilation with normal wall thickness and global  hypokinesis. The RV is also dilated with reduced systolic  function. The mechanical mitral valve is functioning normally  without obstruction. There is severe biatrial enlargement.  T 09/15/15: VT ablation, Dr. Lovena Le Conclusion:Successful catheter ablation of 2 distinct ventricular tachycardias originating from the mitral valve annulus which appeared to correlate with the patient's clinical ventricular tachycardia. Unsuccessful ablation of the inducible ventricular tachycardia originating from the septal portion of the mitral valve annulus as previously noted above.    Patient Profile     78 y.o. male with PMHx of NICM/CRT-D, chronic CHF VHD with MVR (mechanical), persistent AFib and VT.  Remote EPS in 2017 disclosed 4 different VTs, 2 were succesfuly ablated and felt to be his clinical VT.  The patient's underwent epicardial EPS/ablation Feb 2019 in [place] and amiodarone discontinued after that.     Assessment & Plan    1. VT storm 2. A number of failed ATPs, 2 shocks No syncope.  Has  had nonsustained VT but no prolonged VT over the course of his hospitalization.  Has tolerated p.o. amiodarone.  We will plan for discharge today on 200 mg twice a day for a month followed by 200 mg a day.       3. Permanent AFib/flutter     Continue anticoagulation.  [facility] vascular 3 4. Mechanical MVR     Currently on warfarin.  5. NICM     Optive all is without issue.  Chest x-ray is without issue.  No obvious volume overload.   For questions or updates, please contact [place] Please consult [email or web address] for contact info under     Signed, Will Meredith Leeds, MD  10/14/2018, 10:08 AM

## 2018-10-14 NOTE — Plan of Care (Signed)

## 2018-10-14 NOTE — Discharge Summary (Addendum)
Discharge Summary    Patient ID: Austin [name] MRN: [id number]; DOB: [birth year]  Admit date: 10/12/2018 Discharge date: 10/14/2018  Primary Care Provider: Lajean Manes, MD  Primary Cardiologist: [name], MD  Primary Electrophysiologist:  None   Discharge Diagnoses    Principal Problem:   VT (ventricular tachycardia) [facility]) Active Problems:   Atrial fibrillation ([place])   Chronic systolic heart failure ([place])   Automatic implantable cardioverter-defibrillator in situ   H/O mitral valve replacement with mechanical valve   Nonischemic cardiomyopathy ([place])   S/P ablation of ventricular arrhythmia   Neuropathy   Chronic anticoagulation   Allergies No Known Allergies  Diagnostic Studies/Procedures    Echo 10/13/2018 _____________   History of Present Illness     78 y.o. male with PMHx ofNICM/CRT-D, chronic CHF VHD with MVR (mechanical), persistent AFib and VT, admitted 10/12/2018 with recurrent VT.   Hospital Course      [name] has a history of NICM s/p CRT-D, chronic CHF, VHD s/p MVR (mechanical) in 2000, persistent AFib and VT.  He had previous VT ablation in 2017. 4 different VTs, 2 were succesfuly ablated and felt to be his clinical VT.  The patient had a second ablation Feb 2019 in [place] and amiodarone was discontinued after that.   He presented to the ED 10/12/2018 after [place] Interrogation of his device int he ED revealed AFlutter as well as VT. He was admitted and started on IV Amiodarone.  Initially there was consideration of ischemic work up but his Troponin peaked at 0.23 (secondary to ICD discharge) and Dr Lovena Le felt the patient had no ischemic symptoms.and therefore no ischemic work up was done.  Echo showed his EF to be 35-40%.  He was monitored and felt to be stable for discharge 10/14/2018 on OP Amiodarone loading.  _____________  Discharge Vitals Blood pressure 119/70, pulse 62, temperature (!) 97.5  F (36.4 C), temperature source Oral, resp. rate 14, height 6\' 1"  (1.854 m), weight 91.7 kg, SpO2 93 %.  Filed Weights   10/12/18 1448 10/13/18 0400 10/14/18 0420  Weight: 94.8 kg 92.2 kg 91.7 kg    Labs & Radiologic Studies    CBC Recent Labs    10/12/18 1258 10/12/18 1302  WBC  --  12.0*  HGB 15.6 14.8  HCT 46.0 46.2  MCV  --  93.7  PLT  --  626   Basic Metabolic Panel Recent Labs    10/12/18 1302  10/13/18 0254 10/13/18 0255 10/14/18 0301  NA 132*  --  137 139  --   K 4.2  --  3.9 3.9  --   CL 98  --  106 107  --   CO2 22  --  27 25  --   GLUCOSE 143*  --  82 86  --   BUN 25*  --  18 18  --   CREATININE 1.17   < > 1.02 1.02  --   CALCIUM 9.3  --  8.7* 8.9  --   MG 2.0  --   --   --  2.3   < > = values in this interval not displayed.   Liver Function Tests Recent Labs    10/13/18 0255  AST 26  ALT 18  ALKPHOS 54  BILITOT 0.9  PROT 6.0*  ALBUMIN 3.2*   No results for input(s): LIPASE, AMYLASE in the last 72 hours. Cardiac Enzymes Recent Labs  10/12/18 1642 10/12/18 2132 10/13/18 0254  TROPONINI 0.16* 0.23* 0.24*   BNP Invalid input(s): POCBNP D-Dimer No results for input(s): DDIMER in the last 72 hours. Hemoglobin A1C No results for input(s): HGBA1C in the last 72 hours. Fasting Lipid Panel No results for input(s): CHOL, HDL, LDLCALC, TRIG, CHOLHDL, LDLDIRECT in the last 72 hours. Thyroid Function Tests No results for input(s): TSH, T4TOTAL, T3FREE, THYROIDAB in the last 72 hours.  Invalid input(s): FREET3 _____________  Dg Chest Portable 1 View  Result Date: 10/12/2018 CLINICAL DATA:  ICD discharge and lightheadedness. EXAM: PORTABLE CHEST 1 VIEW COMPARISON:  10/26/2015 FINDINGS: Biventricular ICD shows stable positioning. Stable heart size. Evidence of prior mitral valve repair/replacement. External pacing pads present. Lung volumes are low with mild bibasilar atelectasis present. There is no evidence of pulmonary edema, consolidation,  pneumothorax, nodule or pleural fluid. IMPRESSION: Low bilateral lung volumes.  No acute findings. Electronically Signed   By: Aletta Edouard M.D.   On: 10/12/2018 13:45   Disposition   Pt is being discharged home today in good condition.  Follow-up Plans & Appointments    Follow-up Information    [name] Lance, MD Follow up.   Specialty:  Cardiology Why:  you Austin [name] be called by Dr. Tanna Furry scheduler to schedule your follow up with Dr. Lonzo [name] information: [address] [place] [address] [phone]            Discharge Medications   Allergies as of 10/14/2018   No Known Allergies     Medication List    STOP taking these medications   amoxicillin 500 MG tablet Commonly known as:  AMOXIL     TAKE these medications   acetaminophen 500 MG tablet Commonly known as:  TYLENOL Take 1,000 mg by mouth every 6 (six) hours as needed (pain).   amiodarone 200 MG tablet Commonly known as:  PACERONE Take 1 tablet (200 mg total) by mouth 2 (two) times daily for 30 days.   amiodarone 200 MG tablet Commonly known as:  PACERONE Take 1 tablet (200 mg total) by mouth daily. Start taking on:  November 14, 2018   furosemide 20 MG tablet Commonly known as:  LASIX TAKE 1 TABLET BY MOUTH DAILY AS NEEDED FOR WEIGHT OVER 205 What changed:  See the new instructions.   gabapentin 100 MG capsule Commonly known as:  NEURONTIN Take 1 capsule by mouth 3 (three) times daily.   levothyroxine 50 MCG tablet Commonly known as:  Synthroid Take 1 tablet (50 mcg total) by mouth daily before breakfast.   lisinopril 10 MG tablet Commonly known as:  PRINIVIL,ZESTRIL Take 10 mg by mouth at bedtime.   multivitamin with minerals Tabs tablet Take 1 tablet by mouth daily.   nitroGLYCERIN 0.4 MG SL tablet Commonly known as:  NITROSTAT Place 1 tablet (0.4 mg total) under the tongue every 5 (five) minutes x 3 doses as needed for chest pain.   warfarin 5 MG  tablet Commonly known as:  COUMADIN Take as directed. If you are unsure how to take this medication, talk to your nurse or doctor. Original instructions:  TAKE AS DIRECTED BY COUMADIN CLINIC What changed:  See the new instructions.        Acute coronary syndrome (MI, NSTEMI, STEMI, etc) this admission?:  No.  The elevated Troponin was due to the acute medical illness or demand ischemia.    Outstanding Labs/Studies    Duration of Discharge Encounter   Greater than 30 minutes  including physician time.  Signed, Kerin Ransom, PA-C 10/14/2018, 10:56 AM   I have seen and examined this patient with Kerin Ransom.  Agree with above, note added to reflect my findings.  On exam, RRR, no murmurs, lungs clear.  Admitted to the hospital after ICD shocks.  Sophiarose Eades M. Seaira Byus MD 10/14/2018 3:24 PM

## 2018-10-14 NOTE — Plan of Care (Signed)

## 2018-10-14 NOTE — Progress Notes (Addendum)
ANTICOAGULATION CONSULT NOTE - Follow-Up Consult  Pharmacy Consult for Warfarin Indication: MVR/Afib  No Known Allergies  Patient Measurements: Height: 6\' 1"  (185.4 cm) Weight: 202 lb 1.6 oz (91.7 kg) IBW/kg (Calculated) : 79.9  Vital Signs: Temp: 97.5 F (36.4 C) (03/21 0420) Temp Source: Oral (03/21 0420) BP: 119/70 (03/21 0420) Pulse Rate: 62 (03/21 0420)  Labs: Recent Labs    10/12/18 1258  10/12/18 1302 10/12/18 1313 10/12/18 1642 10/12/18 2132 10/13/18 0254 10/13/18 0255 10/14/18 0301  HGB 15.6  --  14.8  --   --   --   --   --   --   HCT 46.0  --  46.2  --   --   --   --   --   --   PLT  --   --  159  --   --   --   --   --   --   LABPROT  --   --  30.2*  --   --   --  29.7*  --  23.6*  INR  --   --  2.9*  --   --   --  2.9*  --  2.1*  CREATININE  --    < > 1.17 1.20  --   --  1.02 1.02  --   TROPONINI  --   --   --   --  0.16* 0.23* 0.24*  --   --    < > = values in this interval not displayed.    Estimated Creatinine Clearance: 70.7 mL/min (by C-G formula based on SCr of 1.02 mg/dL).   Medical History: Past Medical History:  Diagnosis Date  . AICD (automatic cardioverter/defibrillator) present   . Atrial fibrillation ([place])   . 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 Conductive hearing loss   . Intraspinal abscess   . 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 Nonischemic cardiomyopathy ([place])   . Psychosexual dysfunction with inhibited sexual excitement   . S/P mitral valve replacement   . Syncope and collapse   . Unspecified sleep apnea    last sleep study 11/07  . Ventricular tachycardia ([place])   . VT (ventricular tachycardia) ([facility])     Medications:  Medications Prior to Admission  Medication Sig Dispense Refill Last Dose  . acetaminophen (TYLENOL) 500 MG tablet Take 1,000 mg by mouth every 6 (six) hours as needed (pain).    10/12/2018 at prn  . furosemide (LASIX) 20 MG tablet TAKE 1 TABLET BY MOUTH DAILY AS NEEDED FOR WEIGHT OVER 205 (Patient taking differently: Take 20 mg by mouth as needed for fluid. ) 90 tablet 2 Past Month at prn  . gabapentin (NEURONTIN) 100 MG capsule Take 1 capsule by mouth 3 (three) times daily.  0 10/12/2018 at Unknown time  . levothyroxine (SYNTHROID) 50 MCG tablet Take 1 tablet (50 mcg total) by mouth daily before breakfast. 90 tablet 3 10/12/2018 at 0730  . lisinopril (PRINIVIL,ZESTRIL) 10 MG tablet Take 10 mg by mouth at bedtime.   9 10/11/2018 at Unknown time  . Multiple Vitamin (MULTIVITAMIN WITH MINERALS) TABS tablet Take 1 tablet by mouth daily.   10/12/2018 at Unknown time  . warfarin (COUMADIN) 5 MG tablet TAKE AS DIRECTED BY COUMADIN CLINIC (Patient taking differently: Take 5 mg by mouth one time only at 6 PM. 7.5 mg on Tues,  thurs and sat All the other days take 5 mg) 110 tablet 1 10/11/2018 at Unknown time  . amoxicillin (AMOXIL) 500 MG tablet Take 1 tablet (500 mg total) by mouth 2 (two) times daily. (Patient not taking: Reported on 10/12/2018) 90 tablet 0 Not Taking at Unknown time   Scheduled:  . amiodarone  200 mg Oral BID  . [START ON 11/14/2018] amiodarone  200 mg Oral Daily  . gabapentin  100 mg Oral TID  . levothyroxine  50 mcg Oral QAC breakfast  . lisinopril  10 mg Oral QHS  . Warfarin - Pharmacist Dosing Inpatient   Does not apply q1800     Assessment: 78yo male admitted after ICD shocks >> interrogation reveals multiple ATP episodes, now to resume Coumadin for MVR and Afib. INR below goal today at 2.1 despite boosted dose yesterday.   *Home warfarin dose = 7.5mg  Tues/Thurs/Sat, 5mg  all other days  Goal of Therapy:  INR 3.0-3.5   Plan:  -Will give warfarin 8.5mg  x1 tonight (~10% increase) - boosted dose to facilitate INR rise, noting that amiodarone has been started as well -Check INR tomorrow - if below 2.5 will need to consider enoxaparin bridge   Arrie Senate, PharmD, BCPS Clinical Pharmacist [phone] Please check AMION for all [facility] numbers 10/14/2018

## 2018-10-18 ENCOUNTER — Ambulatory Visit (INDEPENDENT_AMBULATORY_CARE_PROVIDER_SITE_OTHER): Payer: Medicare Other | Admitting: Cardiovascular Disease

## 2018-10-18 DIAGNOSIS — Z5181 Encounter for therapeutic drug level monitoring: Secondary | ICD-10-CM | POA: Diagnosis not present

## 2018-10-18 LAB — POCT INR: INR: 2.7 (ref 2.0–3.0)

## 2018-10-26 ENCOUNTER — Ambulatory Visit (INDEPENDENT_AMBULATORY_CARE_PROVIDER_SITE_OTHER): Payer: Medicare Other | Admitting: Pharmacist

## 2018-10-26 DIAGNOSIS — Z5181 Encounter for therapeutic drug level monitoring: Secondary | ICD-10-CM

## 2018-10-26 LAB — POCT INR: INR: 4.3 — AB (ref 2.0–3.0)

## 2018-10-26 NOTE — Patient Instructions (Signed)
Description   Spoke with pt and advised him to hold today's dose, then start taking 5mg  daily except 7.5mg  on Tuesdays and Saturdays. Recheck INR in 1 week. Pt started amiodarone 200mg  BID x4 weeks on 10/14/18, then will decrease to 200mg  daily. Self-tester.

## 2018-10-30 ENCOUNTER — Other Ambulatory Visit: Payer: Self-pay

## 2018-10-30 ENCOUNTER — Ambulatory Visit (INDEPENDENT_AMBULATORY_CARE_PROVIDER_SITE_OTHER): Payer: Medicare Other

## 2018-10-30 DIAGNOSIS — I5022 Chronic systolic (congestive) heart failure: Secondary | ICD-10-CM

## 2018-10-30 DIAGNOSIS — Z95810 Presence of automatic (implantable) cardiac defibrillator: Secondary | ICD-10-CM

## 2018-11-01 NOTE — Progress Notes (Signed)
EPIC Encounter for ICM Monitoring  Patient Name: Austin [name] is a 78 y.o. male Date: 11/01/2018 Primary Care Physican: Lajean Manes, MD Primary Cardiologist:Smith Electrophysiologist: Lovena Le 11/01/2018 Weight:205lbs  Since 12-Oct-2018 VT-NS (>4 beats, >176 bpm) 12 episodes                                              Spoke with patient.  Heart Failure questions reviewed.  He's gained a couple of pounds and took PRN Furosemide today and will take one tomorrow.  He said he feels tired after having device shock and starting on Amiodarone but otherwise okay.  Patient had hospitalization from 3/19 - 3/21 due to device shock.   Report: Thoracic impedance normal.   Prescribed: Furosemide 20 mg 1 tablet as needed.   Labs: 05/02/2017 Creatinine 1.27, BUN 19, Potassium 4.4, Sodium 137, EGFR 55-64 10/28/2015 Creatinine 1.55, BUN 27, Potassium 3.9, Sodium 139, EGFR 43-50  Recommendations:  No changes.  Reinforced limiting salt intake to < 2000 mg daily.  Encouraged to call for fluid symptoms.  Follow-up plan: ICM clinic phone appointment on 12/04/2018.   Patient has office/virtural visit with Dr Lovena Le 11/15/2018    Copy of ICM check sent to Dr. Lovena Le.    3 month ICM trend: 10/30/2018    1 Year ICM trend:       [name], RN 11/01/2018 8:57 AM

## 2018-11-02 ENCOUNTER — Ambulatory Visit (INDEPENDENT_AMBULATORY_CARE_PROVIDER_SITE_OTHER): Payer: Medicare Other | Admitting: Pharmacist

## 2018-11-02 DIAGNOSIS — Z5181 Encounter for therapeutic drug level monitoring: Secondary | ICD-10-CM | POA: Diagnosis not present

## 2018-11-02 LAB — POCT INR: INR: 3 (ref 2.0–3.0)

## 2018-11-05 ENCOUNTER — Other Ambulatory Visit: Payer: Self-pay | Admitting: Cardiology

## 2018-11-10 ENCOUNTER — Ambulatory Visit (INDEPENDENT_AMBULATORY_CARE_PROVIDER_SITE_OTHER): Payer: Medicare Other | Admitting: Pharmacist

## 2018-11-10 ENCOUNTER — Telehealth: Payer: Self-pay | Admitting: Internal Medicine

## 2018-11-10 DIAGNOSIS — I4891 Unspecified atrial fibrillation: Secondary | ICD-10-CM

## 2018-11-10 DIAGNOSIS — Z5181 Encounter for therapeutic drug level monitoring: Secondary | ICD-10-CM

## 2018-11-10 LAB — POCT INR: INR: 5.5 — AB (ref 2.0–3.0)

## 2018-11-10 NOTE — Telephone Encounter (Signed)
New message   Pt set up for video visit with Dr. Lovena Le through [employer]. Pt smart phone number is listed in appt notes.

## 2018-11-10 NOTE — Telephone Encounter (Signed)
New message:   Patient calling to give more information. Please call patient back.

## 2018-11-14 ENCOUNTER — Other Ambulatory Visit: Payer: Self-pay | Admitting: Internal Medicine

## 2018-11-15 ENCOUNTER — Other Ambulatory Visit: Payer: Self-pay

## 2018-11-15 ENCOUNTER — Telehealth (INDEPENDENT_AMBULATORY_CARE_PROVIDER_SITE_OTHER): Payer: Medicare Other | Admitting: Internal Medicine

## 2018-11-15 DIAGNOSIS — I472 Ventricular tachycardia, unspecified: Secondary | ICD-10-CM

## 2018-11-15 DIAGNOSIS — I4891 Unspecified atrial fibrillation: Secondary | ICD-10-CM

## 2018-11-15 DIAGNOSIS — I5022 Chronic systolic (congestive) heart failure: Secondary | ICD-10-CM

## 2018-11-15 NOTE — Progress Notes (Signed)
Electrophysiology TeleHealth Note   Due to national recommendations of social distancing due to COVID 19, an audio/video telehealth visit is felt to be most appropriate for this patient at this time.  See MyChart message from today for the patient's consent to telehealth for Austin [name].   Date:  11/15/2018   ID:  Austin [name], DOB 06/01/1941, MRN [id number]  Location: patient's home  Provider location: [address], [place]  Evaluation Performed: Follow-up visit  PCP:  Lajean Manes, MD  Cardiologist:  [name], MD Electrophysiologist:  Dr Lovena Le  Chief Complaint:  "I am feeling ok"  History of Present Illness:    Austin [name] is a 78 y.o. male who presents via audio/video conferencing for a telehealth visit today.  Since last being seen in our clinic, the patient reports doing ok. He is a pleasant 78 yo man with VT, syncope, s/p multiple ablations and mechanical MV. He did well for a year but was admitted a month ago with VT and placed back on amiodarone.  Today, he denies symptoms of chest pain, shortness of breath,  lower extremity edema, dizziness, presyncope, or syncope. He does have some palpitations.  The patient is otherwise without complaint today.  The patient denies symptoms of fevers, chills, cough, or new SOB worrisome for COVID 19.  Past Medical History:  Diagnosis Date  . AICD (automatic cardioverter/defibrillator) present   . Atrial fibrillation ([place])   . 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 Conductive hearing loss   . Intraspinal abscess   . 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 Nonischemic cardiomyopathy ([place])   . Psychosexual dysfunction with inhibited sexual excitement   . S/P mitral valve replacement   . Syncope and collapse   . Unspecified sleep apnea    last sleep study 11/07  . Ventricular  tachycardia ([place])   . VT (ventricular tachycardia) ([place])     Past Surgical History:  Procedure Laterality Date  . BIV ICD GENERATOR CHANGEOUT N/A 05/04/2017   Procedure: BiV ICD Generator Changeout;  Surgeon: Evans Lance, MD;  Location: [facility];  Service: Cardiovascular;  Laterality: N/A;  . CARDIAC CATHETERIZATION  04/24/2002  . CARDIAC VALVE REPLACEMENT    . CARDIOVERSION  03/09/2012   Procedure: CARDIOVERSION;  Surgeon: Evans Lance, MD;  Location: [facility];  Service: Cardiovascular;  Laterality: N/A;  . CARDIOVERSION N/A 11/22/2013   Procedure: CARDIOVERSION;  Surgeon: Sanda Klein, MD;  Location: [facility];  Service: Cardiovascular;  Laterality: N/A;  . dental implants    . ELECTROPHYSIOLOGIC STUDY N/A 09/15/2015   Procedure: V Tach Ablation;  Surgeon: Evans Lance, MD;  Location: [facility];  Service: Cardiovascular;  Laterality: N/A;  . Evacution of epidural lumbar epidural abscess  1999  . INSERT / REPLACE / REMOVE PACEMAKER  11/2008  . MITRAL VALVE REPLACEMENT     w #[id number] [facility]  . PERMANENT PACEMAKER INSERTION N/A 08/05/2011   Procedure: PERMANENT PACEMAKER INSERTION;  Surgeon: Evans Lance, MD;  Location: [facility] C[facility];  Service: Cardiovascular;  Laterality: N/A;  . THYROIDECTOMY    . TONSILLECTOMY    . VALVE REPLACEMENT  2000    Current Outpatient Medications  Medication Sig Dispense Refill  . acetaminophen (TYLENOL) 500 MG tablet Take 1,000 mg by mouth every 6 (six) hours as needed (pain).    Marland Kitchen  amiodarone (PACERONE) 200 MG tablet Take 1 tablet (200 mg total) by mouth daily. 90 tablet 3  . amiodarone (PACERONE) 200 MG tablet TAKE 1 TABLET TWICE A DAY 60 tablet 0  . furosemide (LASIX) 20 MG tablet TAKE 1 TABLET BY MOUTH DAILY AS NEEDED FOR WEIGHT OVER 205 (Patient taking differently: Take 20 mg by mouth as needed for fluid. ) 90 tablet 2  . gabapentin (NEURONTIN) 100 MG capsule Take 1 capsule by mouth 3 (three) times daily.  0  .  levothyroxine (SYNTHROID) 50 MCG tablet TAKE 1 TABLET (50 MCG TOTAL) BY MOUTH DAILY BEFORE BREAKFAST. 90 tablet 2  . lisinopril (PRINIVIL,ZESTRIL) 10 MG tablet Take 10 mg by mouth at bedtime.   9  . Multiple Vitamin (MULTIVITAMIN WITH MINERALS) TABS tablet Take 1 tablet by mouth daily.    . nitroGLYCERIN (NITROSTAT) 0.4 MG SL tablet Place 1 tablet (0.4 mg total) under the tongue every 5 (five) minutes x 3 doses as needed for chest pain. 25 tablet 2  . warfarin (COUMADIN) 5 MG tablet TAKE AS DIRECTED BY COUMADIN CLINIC (Patient taking differently: Take 5 mg by mouth one time only at 6 PM. 7.5 mg on Tues, thurs and sat All the other days take 5 mg) 110 tablet 1   No current facility-administered medications for this visit.     Allergies:   Patient has no known allergies.   Social History:  The patient  reports that he has never smoked. He has never used smokeless tobacco. He reports current alcohol use. He reports that he does not use drugs.   Family History:  The patient's family history includes Heart disease in his father and mother; Heart failure in his father and mother.   ROS:  Please see the history of present illness.   All other systems are personally reviewed and negative.    Exam:    Vital Signs:  There were no vitals taken for this visit.  Well appearing, alert and conversant, regular work of breathing,  good skin color Eyes- anicteric, neuro- grossly intact, skin- no apparent rash or lesions or cyanosis, mouth- oral mucosa is pink   Labs/Other Tests and Data Reviewed:    Recent Labs: 04/18/2018: TSH 2.490 10/12/2018: Hemoglobin 14.8; Platelets 159 10/13/2018: ALT 18; BUN 18; Creatinine, Ser 1.02; Potassium 3.9; Sodium 139 10/14/2018: Magnesium 2.3   Wt Readings from Last 3 Encounters:  10/14/18 202 lb 1.6 oz (91.7 kg)  09/26/18 203 lb (92.1 kg)  08/25/18 208 lb 6 oz (94.5 kg)     Other studies personally reviewed:  Last device remote is reviewed from [facility] PDF  dated 3/20 which reveals normal device function, no arrhythmias    ASSESSMENT & PLAN:    1.  VT - he will continue amiodarone 200 bid for another month, cutting back to 200 mg daily after Memorial day. 2. Chronic systolic heart failure - he notes that his weight is up 3-4 lbs. I asked him to take lasix 40 mg daily. 3. Atrial fib - his rate appears to be controlled.  4. COVID 19 screen The patient denies symptoms of COVID 19 at this time.  The importance of social distancing was discussed today.  Follow-up:  With me in June 2020 Next remote: June 2020  Current medicines are reviewed at length with the patient today.   The patient does not have concerns regarding his medicines.  The following changes were made today:  none  Labs/ tests ordered today include:  No orders  of the defined types were placed in this encounter.    Patient Risk:  after full review of this patients clinical status, I feel that they are at moderate risk at this time.  Today, I have spent 25 minutes with the patient with telehealth technology discussing all of the above.    Signed, [name], MD  11/15/2018 9:42 AM     [place] [address] [place] [address] [phone] (office) [phone] (fax)

## 2018-11-16 ENCOUNTER — Ambulatory Visit (INDEPENDENT_AMBULATORY_CARE_PROVIDER_SITE_OTHER): Payer: Medicare Other | Admitting: Cardiology

## 2018-11-16 DIAGNOSIS — I4891 Unspecified atrial fibrillation: Secondary | ICD-10-CM

## 2018-11-16 DIAGNOSIS — Z5181 Encounter for therapeutic drug level monitoring: Secondary | ICD-10-CM | POA: Diagnosis not present

## 2018-11-16 LAB — POCT INR: INR: 3.2 — AB (ref 2.0–3.0)

## 2018-11-16 NOTE — Patient Instructions (Addendum)
Description   Spoke with pt and advised him to continue taking the dose he has been taking: 5mg  daily except 7.5mg  on Tuesday. Recheck INR in 2 weeks. Pt started amiodarone 200mg  BID x4 weeks on 10/14/18, then will decrease to 200mg  daily. Self-tester.

## 2018-11-30 ENCOUNTER — Telehealth: Payer: Self-pay | Admitting: Interventional Cardiology

## 2018-11-30 ENCOUNTER — Ambulatory Visit (INDEPENDENT_AMBULATORY_CARE_PROVIDER_SITE_OTHER): Payer: Medicare Other | Admitting: Pharmacist

## 2018-11-30 ENCOUNTER — Other Ambulatory Visit: Payer: Self-pay | Admitting: Cardiology

## 2018-11-30 DIAGNOSIS — Z5181 Encounter for therapeutic drug level monitoring: Secondary | ICD-10-CM | POA: Diagnosis not present

## 2018-11-30 DIAGNOSIS — I4891 Unspecified atrial fibrillation: Secondary | ICD-10-CM | POA: Diagnosis not present

## 2018-11-30 LAB — POCT INR: INR: 5.4 — AB (ref 2.0–3.0)

## 2018-11-30 NOTE — Telephone Encounter (Signed)
New Message:    Please call, she has a critical INR.

## 2018-11-30 NOTE — Telephone Encounter (Signed)
Austin [name] with Pt Coag services will be faxing an INR from today... 5.4  Will forward to the anticoagulation clinic for review.

## 2018-11-30 NOTE — Patient Instructions (Addendum)
Description   Spoke with pt and advised him to hold today and tomorrow's dose then start taking 5mg  daily. Recheck INR in 1 week. Pt started amiodarone 200mg  BID x4 weeks on 10/14/18, then will decrease to 200mg  daily. Self-tester.

## 2018-11-30 NOTE — Telephone Encounter (Signed)
Result noted and addressed, see anticoagulation note in Epic.

## 2018-12-04 ENCOUNTER — Ambulatory Visit (INDEPENDENT_AMBULATORY_CARE_PROVIDER_SITE_OTHER): Payer: Medicare Other

## 2018-12-04 ENCOUNTER — Other Ambulatory Visit: Payer: Self-pay

## 2018-12-04 DIAGNOSIS — Z95810 Presence of automatic (implantable) cardiac defibrillator: Secondary | ICD-10-CM | POA: Diagnosis not present

## 2018-12-04 DIAGNOSIS — I5022 Chronic systolic (congestive) heart failure: Secondary | ICD-10-CM

## 2018-12-07 ENCOUNTER — Other Ambulatory Visit: Payer: Self-pay

## 2018-12-07 ENCOUNTER — Ambulatory Visit (INDEPENDENT_AMBULATORY_CARE_PROVIDER_SITE_OTHER): Payer: Medicare Other | Admitting: Pharmacist

## 2018-12-07 DIAGNOSIS — Z5181 Encounter for therapeutic drug level monitoring: Secondary | ICD-10-CM

## 2018-12-07 DIAGNOSIS — I4891 Unspecified atrial fibrillation: Secondary | ICD-10-CM | POA: Diagnosis not present

## 2018-12-07 LAB — POCT INR: INR: 3.9 — AB (ref 2.0–3.0)

## 2018-12-07 MED ORDER — AMIODARONE HCL 200 MG PO TABS: 200.0000 mg | ORAL_TABLET | Freq: Every day | ORAL | 3 refills | Status: DC

## 2018-12-08 NOTE — Progress Notes (Signed)
Patient notified of orders by Dr Thompson Caul nurse Maude Leriche.

## 2018-12-08 NOTE — Progress Notes (Signed)
Take furosemide 40 mg BID for 4 doses or return to optimal weight, whichever occurs first. Thereafter resume usual dose.

## 2018-12-08 NOTE — Progress Notes (Signed)
Spoke with pt and went over recommendations.  Pt verbalized understanding and was appreciative for call.  

## 2018-12-08 NOTE — Progress Notes (Signed)
EPIC Encounter for ICM Monitoring  Patient Name: Austin [name] is a 78 y.o. male Date: 12/08/2018 Primary Care Physican: Lajean Manes, MD Primary Cardiologist:Smith Electrophysiologist: Lovena Le 11/01/2018 Weight:205lbs 12/08/2018 Weight: 205 lbs  Transmission reviewed.  Spoke with patient and he is symptomatic with weight gain of 3 lbs above baseline.   Optivol Thoracic impedance slightly below baseline normal suggesting fluid accumulation since 11/24/2018.   Prescribed:Furosemide 20 mg 1 tablet as needed.  Patient has been taking 40 mg daily for last 10 dayswith loss of some weight but still above baseline.  He said he asked Dr Lovena Le about taking 40 mg as needed and Dr Lovena Le told him that would be fine.   Labs: 10/13/2018 Creatinine 1.02, BUN 18, Potassium 3.9, Sodium 139, GFR >60 10/12/2018 Creatinine 1.17, BUN 25, Potassium 4.2, Sodium 132, GFR >60 05/02/2017 Creatinine 1.27, BUN 19, Potassium 4.4, Sodium 137, GFR 55-64 10/28/2015 Creatinine 1.55, BUN 27, Potassium 3.9, Sodium 139, GFR 43-50  Recommendations: Patient will continue to take PRN Furosemide for next 3 days.  Follow-up plan: ICM clinic phone appointment on5/19/2020 (manual send) to recheck fluid levels.    Copy of ICM check sent to Dr.Taylor and Dr Tamala Julian.    3 month ICM trend: 12/04/2018    1 Year ICM trend:       [name], RN 12/08/2018 8:26 AM

## 2018-12-12 ENCOUNTER — Ambulatory Visit (INDEPENDENT_AMBULATORY_CARE_PROVIDER_SITE_OTHER): Payer: Medicare Other

## 2018-12-12 ENCOUNTER — Other Ambulatory Visit: Payer: Self-pay

## 2018-12-12 DIAGNOSIS — Z95810 Presence of automatic (implantable) cardiac defibrillator: Secondary | ICD-10-CM

## 2018-12-12 DIAGNOSIS — I5022 Chronic systolic (congestive) heart failure: Secondary | ICD-10-CM

## 2018-12-13 MED ORDER — FUROSEMIDE 20 MG PO TABS: 60.0000 mg | ORAL_TABLET | Freq: Every day | ORAL | 2 refills | Status: DC

## 2018-12-13 NOTE — Progress Notes (Signed)
Spoke with patient.  Advised Dr Tamala Julian recommended to Increase furosemide to 60 mg daily. Advised he has 20 mg tablets so he will need to take 3 tablets every morning.  He agreed to have BMET lab drawn 12/20/2018. He should continue to record daily weight before eating or drinking when he gets out of bed.  He verbalized understanding of new recommendations and repeated back correctly.

## 2018-12-13 NOTE — Progress Notes (Signed)
EPIC Encounter for ICM Monitoring  Patient Name: Austin [name] is a 78 y.o. male Date: 12/13/2018 Primary Care Physican: Lajean Manes, MD Primary Cardiologist:Smith Electrophysiologist: Lovena Le 4/8/2020Weight:205lbs 12/08/2018 Weight: 205 lbs 12/13/2018 Weight: 203.9 lbs  Transmission reviewed.  Spoke with patient. Weight decreased by 1.1 lbs after taking Furosemide 40 mg bid x 2 days as recommended by Dr Tamala Julian.       He reports urinary out put was better the 2nd day of extra Furosemide.   Optivol Thoracic impedance improved 5/13-5/15 while taking Furosemide 40 mg bid but became worse on 5/16.   Prescribed:Furosemide 20 mg 1 tablet as needed.  Patient has been routinely been taking Lasix 20 mg every few days even when not symptomatic. He then began taking Lasix 40 mg every day for past 3 weeks because he could not keep weight at baseline.   Labs: 10/13/2018 Creatinine 1.02, BUN 18, Potassium 3.9, Sodium 139, GFR >60 10/12/2018 Creatinine 1.17, BUN 25, Potassium 4.2, Sodium 132, GFR >60 05/02/2017 Creatinine 1.27, BUN 19, Potassium 4.4, Sodium 137, GFR 55-64 10/28/2015 Creatinine 1.55, BUN 27, Potassium 3.9, Sodium 139, GFR 43-50  Recommendations:Advised his prescription is written to take Lasix 20 mg only if he has symptoms.  Advised if he has fluid symptoms to take PRN dosage 1 tablet x 3 days and if no improvement to call the office.    Follow-up plan: ICM clinic phone appointment on6/15/2020.  Copy of ICM check sent to Dr.Taylor and Dr Tamala Julian to view effectiveness of increased Lasix dosage x 2 days  3 month ICM trend: 12/12/2018    1 Year ICM trend:       [name], RN 12/13/2018 3:19 PM

## 2018-12-13 NOTE — Progress Notes (Signed)
Rescheduled remote transmission date to 12/19/2018 to recheck fluid levels.

## 2018-12-13 NOTE — Progress Notes (Signed)
Increase furosemide to 60 mg daily. BMET in 1 week. Continue to weigh.

## 2018-12-14 ENCOUNTER — Ambulatory Visit (INDEPENDENT_AMBULATORY_CARE_PROVIDER_SITE_OTHER): Payer: Medicare Other | Admitting: Cardiovascular Disease

## 2018-12-14 ENCOUNTER — Telehealth: Payer: Self-pay | Admitting: Interventional Cardiology

## 2018-12-14 DIAGNOSIS — I4891 Unspecified atrial fibrillation: Secondary | ICD-10-CM

## 2018-12-14 DIAGNOSIS — Z5181 Encounter for therapeutic drug level monitoring: Secondary | ICD-10-CM | POA: Diagnosis not present

## 2018-12-14 LAB — POCT INR: INR: 4.7 — AB (ref 2.0–3.0)

## 2018-12-14 NOTE — Telephone Encounter (Signed)
Already dosed and spoke with pt - see separate anticoag encounter.

## 2018-12-14 NOTE — Telephone Encounter (Signed)
New Message    Austin [name] is calling with the pts INR results from today 12/14/18  INR result 4.7  They are also sending the fax records

## 2018-12-19 ENCOUNTER — Ambulatory Visit (INDEPENDENT_AMBULATORY_CARE_PROVIDER_SITE_OTHER): Payer: Medicare Other | Admitting: *Deleted

## 2018-12-19 ENCOUNTER — Ambulatory Visit (INDEPENDENT_AMBULATORY_CARE_PROVIDER_SITE_OTHER): Payer: Medicare Other

## 2018-12-19 DIAGNOSIS — I4891 Unspecified atrial fibrillation: Secondary | ICD-10-CM

## 2018-12-19 DIAGNOSIS — Z95810 Presence of automatic (implantable) cardiac defibrillator: Secondary | ICD-10-CM

## 2018-12-19 DIAGNOSIS — I5022 Chronic systolic (congestive) heart failure: Secondary | ICD-10-CM

## 2018-12-19 DIAGNOSIS — I428 Other cardiomyopathies: Secondary | ICD-10-CM

## 2018-12-19 LAB — CUP PACEART REMOTE DEVICE CHECK
Battery Remaining Longevity: 114 mo
Battery Voltage: 3.02 V
Brady Statistic AP VP Percent: 0 %
Brady Statistic AP VS Percent: 0 %
Brady Statistic AS VP Percent: 46.43 %
Brady Statistic AS VS Percent: 53.57 %
Brady Statistic RA Percent Paced: 0 %
Brady Statistic RV Percent Paced: 34.89 %
Date Time Interrogation Session: 20200526052404
HighPow Impedance: 73 Ohm
Implantable Lead Implant Date: 20130110
Implantable Lead Implant Date: 20130110
Implantable Lead Location: 753859
Implantable Lead Location: 753860
Implantable Lead Model: 5076
Implantable Lead Model: 6935
Implantable Pulse Generator Implant Date: 20181010
Lead Channel Impedance Value: 342 Ohm
Lead Channel Impedance Value: 399 Ohm
Lead Channel Impedance Value: 418 Ohm
Lead Channel Pacing Threshold Amplitude: 0.875 V
Lead Channel Pacing Threshold Pulse Width: 0.4 ms
Lead Channel Sensing Intrinsic Amplitude: 2.875 mV
Lead Channel Sensing Intrinsic Amplitude: 2.875 mV
Lead Channel Sensing Intrinsic Amplitude: 3.25 mV
Lead Channel Sensing Intrinsic Amplitude: 3.25 mV
Lead Channel Setting Pacing Amplitude: 2.5 V
Lead Channel Setting Pacing Pulse Width: 0.4 ms
Lead Channel Setting Sensing Sensitivity: 0.3 mV

## 2018-12-20 ENCOUNTER — Encounter: Payer: Self-pay | Admitting: *Deleted

## 2018-12-20 ENCOUNTER — Other Ambulatory Visit: Payer: Self-pay

## 2018-12-20 ENCOUNTER — Telehealth: Payer: Self-pay | Admitting: *Deleted

## 2018-12-20 ENCOUNTER — Other Ambulatory Visit: Payer: Medicare Other | Admitting: *Deleted

## 2018-12-20 DIAGNOSIS — R7989 Other specified abnormal findings of blood chemistry: Secondary | ICD-10-CM

## 2018-12-20 DIAGNOSIS — Z95810 Presence of automatic (implantable) cardiac defibrillator: Secondary | ICD-10-CM

## 2018-12-20 DIAGNOSIS — I5022 Chronic systolic (congestive) heart failure: Secondary | ICD-10-CM

## 2018-12-20 LAB — BASIC METABOLIC PANEL
BUN/Creatinine Ratio: 18 (ref 10–24)
BUN: 26 mg/dL (ref 8–27)
CO2: 24 mmol/L (ref 20–29)
Calcium: 9.3 mg/dL (ref 8.6–10.2)
Chloride: 98 mmol/L (ref 96–106)
Creatinine, Ser: 1.47 mg/dL — ABNORMAL HIGH (ref 0.76–1.27)
GFR calc Af Amer: 53 mL/min/{1.73_m2} — ABNORMAL LOW (ref >59)
GFR calc non Af Amer: 46 mL/min/{1.73_m2} — ABNORMAL LOW (ref >59)
Glucose: 80 mg/dL (ref 65–99)
Potassium: 4.5 mmol/L (ref 3.5–5.2)
Sodium: 137 mmol/L (ref 134–144)

## 2018-12-20 NOTE — Telephone Encounter (Signed)
-----   Message from Belva Crome, MD sent at 12/20/2018  4:09 PM EDT ----- Let the patient know kidney function not quite as good on more diuretic, but if beathing better and BP okay, this dose should be continued. Repeat BMET 10-14 dats. A copy will be sent to Lajean Manes, MD

## 2018-12-20 NOTE — Telephone Encounter (Signed)
Spoke with pt and he states breathing has improved and BP is fine.  Advised to continue current medications and scheduled pt for labs on 6/8.  Pt appreciative for call.

## 2018-12-20 NOTE — Progress Notes (Signed)
EPIC Encounter for ICM Monitoring  Patient Name: Austin [name] is a 78 y.o. male Date: 12/20/2018 Primary Care Physican: Lajean Manes, MD Primary Cardiologist:Smith Electrophysiologist: Lovena Le 4/8/2020Weight:205lbs 12/08/2018 Weight: 205 lbs 12/13/2018 Weight: 203.9 lbs 12/20/2018 Weight: 201.5 lbs  Transmission reviewed.Spoke with patient and weight loss occurred in the last 2 days. He is feeling good and breathing is normal. Last 2 days of BP reading was 111/68 and 130/78.        Patient took 40 mg of Furosemide today instead of 60 mg and advised to stay on 60 mg dosage unless Dr Tamala Julian makes a different recommendation.   OptivolThoracic impedance returned close to baseline normal.   Prescribed:Furosemide 20 mg take 3 tablets (60 mg total) daily (increased 5/20).  Labs: 12/20/2018 Creatinine 1.47, BUN 26, Potassium 4.5, Sodium 137, GFR 46-53 10/13/2018 Creatinine 1.02, BUN 18, Potassium 3.9, Sodium 139, GFR >60 10/12/2018 Creatinine 1.17, BUN 25, Potassium 4.2, Sodium 132, GFR >60 05/02/2017 Creatinine 1.27, BUN 19, Potassium 4.4, Sodium 137, GFR 55-64 10/28/2015 Creatinine 1.55, BUN 27, Potassium 3.9, Sodium 139, GFR 43-50  Recommendations:No changes. Encouraged to call if experiences fluid symptoms.    Follow-up plan: ICM clinic phone appointment on6/15/2020.  Copy of ICM check sent to Dr.Taylorand Dr Tamala Julian to show effectiveness of increased Lasix dosage to 60 mg daily.  3 month ICM trend: 12/19/2018    1 Year ICM trend:       [name], RN 12/20/2018 4:44 PM

## 2018-12-21 ENCOUNTER — Ambulatory Visit (INDEPENDENT_AMBULATORY_CARE_PROVIDER_SITE_OTHER): Payer: Medicare Other | Admitting: Pharmacist

## 2018-12-21 DIAGNOSIS — I4891 Unspecified atrial fibrillation: Secondary | ICD-10-CM | POA: Diagnosis not present

## 2018-12-21 DIAGNOSIS — Z5181 Encounter for therapeutic drug level monitoring: Secondary | ICD-10-CM

## 2018-12-21 LAB — POCT INR: INR: 2.8 (ref 2.0–3.0)

## 2018-12-22 ENCOUNTER — Telehealth: Payer: Medicare Other | Admitting: Neurology

## 2018-12-22 ENCOUNTER — Other Ambulatory Visit: Payer: Self-pay

## 2018-12-28 ENCOUNTER — Ambulatory Visit (INDEPENDENT_AMBULATORY_CARE_PROVIDER_SITE_OTHER): Payer: Medicare Other | Admitting: Internal Medicine

## 2018-12-28 DIAGNOSIS — Z5181 Encounter for therapeutic drug level monitoring: Secondary | ICD-10-CM

## 2018-12-28 DIAGNOSIS — I4891 Unspecified atrial fibrillation: Secondary | ICD-10-CM

## 2018-12-28 LAB — POCT INR: INR: 3.9 — AB (ref 2.0–3.0)

## 2018-12-28 NOTE — Patient Instructions (Signed)
Description   Spoke with pt, advised to skip today's dosage of Coumadin, then resume same dosage 1 tablet daily except for 1/2 tablet on Sundays, Tuesdays, and Thursdays. Recheck INR in 1 week. Pt started amiodarone 200mg  BID x4 weeks on 10/14/18, then will decrease to 200mg  daily (started this dose on 12/13/18). Self-tester.

## 2018-12-29 ENCOUNTER — Telehealth: Payer: Self-pay | Admitting: *Deleted

## 2018-12-29 NOTE — Telephone Encounter (Signed)
    COVID-19 Pre-Screening Questions:  . In the past 7 to 10 days have you had a cough,  shortness of breath, headache, congestion, fever (100 or greater) body aches, chills, sore throat, or sudden loss of taste or sense of smell? . Have you been around anyone with known Covid 19. . Have you been around anyone who is awaiting Covid 19 test results in the past 7 to 10 days? . Have you been around anyone who has been exposed to Covid 19, or has mentioned symptoms of Covid 19 within the past 7 to 10 days?  If you have any concerns/questions about symptoms patients report during screening (either on the phone or at threshold). Contact the provider seeing the patient or DOD for further guidance.  If neither are available contact a member of the leadership team.           Contacted patient via telephone call. No to call Covid 19 questions . Understands the importantce of a mask. KB  

## 2019-01-01 ENCOUNTER — Other Ambulatory Visit: Payer: Self-pay

## 2019-01-01 ENCOUNTER — Other Ambulatory Visit: Payer: Medicare Other | Admitting: *Deleted

## 2019-01-01 DIAGNOSIS — R7989 Other specified abnormal findings of blood chemistry: Secondary | ICD-10-CM

## 2019-01-01 LAB — BASIC METABOLIC PANEL
BUN/Creatinine Ratio: 20 (ref 10–24)
BUN: 27 mg/dL (ref 8–27)
CO2: 23 mmol/L (ref 20–29)
Calcium: 9.1 mg/dL (ref 8.6–10.2)
Chloride: 98 mmol/L (ref 96–106)
Creatinine, Ser: 1.38 mg/dL — ABNORMAL HIGH (ref 0.76–1.27)
GFR calc Af Amer: 57 mL/min/{1.73_m2} — ABNORMAL LOW (ref >59)
GFR calc non Af Amer: 49 mL/min/{1.73_m2} — ABNORMAL LOW (ref >59)
Glucose: 81 mg/dL (ref 65–99)
Potassium: 4.6 mmol/L (ref 3.5–5.2)
Sodium: 137 mmol/L (ref 134–144)

## 2019-01-01 NOTE — Progress Notes (Signed)
Remote ICD transmission.   

## 2019-01-04 ENCOUNTER — Ambulatory Visit (INDEPENDENT_AMBULATORY_CARE_PROVIDER_SITE_OTHER): Payer: Medicare Other

## 2019-01-04 DIAGNOSIS — Z5181 Encounter for therapeutic drug level monitoring: Secondary | ICD-10-CM

## 2019-01-04 DIAGNOSIS — I4891 Unspecified atrial fibrillation: Secondary | ICD-10-CM

## 2019-01-04 LAB — POCT INR: INR: 2.3 (ref 2.0–3.0)

## 2019-01-04 NOTE — Patient Instructions (Signed)
Description   Spoke with pt, advised to take 5mg  today, then resume same dosage 1 tablet daily except for 1/2 tablet on Sundays, Tuesdays, and Thursdays. Recheck INR in 1 week. Pt started amiodarone 200mg  BID x4 weeks on 10/14/18, then will decrease to 200mg  daily (started this dose on 12/13/18). Self-tester.

## 2019-01-08 ENCOUNTER — Ambulatory Visit (INDEPENDENT_AMBULATORY_CARE_PROVIDER_SITE_OTHER): Payer: Medicare Other

## 2019-01-08 DIAGNOSIS — Z95810 Presence of automatic (implantable) cardiac defibrillator: Secondary | ICD-10-CM | POA: Diagnosis not present

## 2019-01-08 DIAGNOSIS — I5022 Chronic systolic (congestive) heart failure: Secondary | ICD-10-CM | POA: Diagnosis not present

## 2019-01-09 NOTE — Progress Notes (Signed)
EPIC Encounter for ICM Monitoring  Patient Name: Austin [name] is a 78 y.o. male Date: 01/09/2019 Primary Care Physican: Lajean Manes, MD Primary Cardiologist:Smith Electrophysiologist: Lovena Le 12/20/2018 Weight: 201.5 lbs 01/10/2019 Weight: 200 lbs  Transmission reviewed.Spoke with patient and pt is fine and has no complaints.     OptivolThoracic impedancereturned close to baseline normal.   Prescribed:Furosemide 20 mg take 3 tablets (60 mg total) daily.  Labs: 12/20/2018 Creatinine 1.47, BUN 26, Potassium 4.5, Sodium 137, GFR 46-53 10/13/2018 Creatinine 1.02, BUN 18, Potassium 3.9, Sodium 139, GFR >60 10/12/2018 Creatinine 1.17, BUN 25, Potassium 4.2, Sodium 132, GFR >60 05/02/2017 Creatinine 1.27, BUN 19, Potassium 4.4, Sodium 137, GFR 55-64 10/28/2015 Creatinine 1.55, BUN 27, Potassium 3.9, Sodium 139, GFR 43-50  Recommendations:No changes. Encouraged to call if experiences fluid symptoms.    Follow-up plan: ICM clinic phone appointment on7/20/2020.Office appt with Dr Lovena Le 01/23/2019.  Copy of ICM check sent to Dr.Taylor   3 month ICM trend: 01/08/2019    1 Year ICM trend:       [name], RN 01/09/2019 9:18 AM

## 2019-01-11 ENCOUNTER — Ambulatory Visit (INDEPENDENT_AMBULATORY_CARE_PROVIDER_SITE_OTHER): Payer: Medicare Other

## 2019-01-11 DIAGNOSIS — Z5181 Encounter for therapeutic drug level monitoring: Secondary | ICD-10-CM | POA: Diagnosis not present

## 2019-01-11 DIAGNOSIS — I4891 Unspecified atrial fibrillation: Secondary | ICD-10-CM | POA: Diagnosis not present

## 2019-01-11 LAB — POCT INR: INR: 2.8 (ref 2.0–3.0)

## 2019-01-11 NOTE — Patient Instructions (Signed)
Description   Spoke with pt, advised to take 5mg  today, then resume same dosage 1 tablet daily except for 1/2 tablet on Sundays, Tuesdays, and Thursdays. Recheck INR in 2 weeks. Pt started amiodarone 200mg  BID x4 weeks on 10/14/18, then will decrease to 200mg  daily (started this dose on 12/13/18). Self-tester.

## 2019-01-22 ENCOUNTER — Telehealth: Payer: Self-pay | Admitting: Internal Medicine

## 2019-01-22 NOTE — Telephone Encounter (Signed)
New Message    Called and was unable to leave message, phone would only ring. Need to confirm appt and answer COVID questions

## 2019-01-23 ENCOUNTER — Encounter: Payer: Self-pay | Admitting: Internal Medicine

## 2019-01-23 ENCOUNTER — Ambulatory Visit (INDEPENDENT_AMBULATORY_CARE_PROVIDER_SITE_OTHER): Payer: Medicare Other | Admitting: Internal Medicine

## 2019-01-23 ENCOUNTER — Other Ambulatory Visit: Payer: Self-pay

## 2019-01-23 VITALS — BP 116/66 | HR 53 | Ht 73.0 in | Wt 201.8 lb

## 2019-01-23 DIAGNOSIS — Z95810 Presence of automatic (implantable) cardiac defibrillator: Secondary | ICD-10-CM | POA: Diagnosis not present

## 2019-01-23 DIAGNOSIS — I472 Ventricular tachycardia, unspecified: Secondary | ICD-10-CM

## 2019-01-23 DIAGNOSIS — I4819 Other persistent atrial fibrillation: Secondary | ICD-10-CM | POA: Diagnosis not present

## 2019-01-23 DIAGNOSIS — I5022 Chronic systolic (congestive) heart failure: Secondary | ICD-10-CM

## 2019-01-23 LAB — CUP PACEART INCLINIC DEVICE CHECK
Date Time Interrogation Session: 20200630112610
Implantable Lead Implant Date: 20130110
Implantable Lead Implant Date: 20130110
Implantable Lead Location: 753859
Implantable Lead Location: 753860
Implantable Lead Model: 5076
Implantable Lead Model: 6935
Implantable Pulse Generator Implant Date: 20181010

## 2019-01-23 NOTE — Progress Notes (Signed)
HPI [name] returns today for followup of VT and his ICD. He has a MVR and then developed VT with syncope. 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 He was weaned off of amiodarone for a year but then developed recurrent VT and has gone back on the amio. He has felt a little foggy.  No Known Allergies   Current Outpatient Medications  Medication Sig Dispense Refill  . acetaminophen (TYLENOL) 500 MG tablet Take 1,000 mg by mouth every 6 (six) hours as needed (pain).    Marland Kitchen amiodarone (PACERONE) 200 MG tablet Take 1 tablet (200 mg total) by mouth daily. 90 tablet 3  . furosemide (LASIX) 20 MG tablet Take 3 tablets (60 mg total) by mouth daily. 270 tablet 2  . gabapentin (NEURONTIN) 100 MG capsule Take 1 capsule by mouth 3 (three) times daily.  0  . levothyroxine (SYNTHROID) 50 MCG tablet TAKE 1 TABLET (50 MCG TOTAL) BY MOUTH DAILY BEFORE BREAKFAST. 90 tablet 2  . lisinopril (PRINIVIL,ZESTRIL) 10 MG tablet Take 10 mg by mouth at bedtime.   9  . Multiple Vitamin (MULTIVITAMIN WITH MINERALS) TABS tablet Take 1 tablet by mouth daily.    . nitroGLYCERIN (NITROSTAT) 0.4 MG SL tablet Place 1 tablet (0.4 mg total) under the tongue every 5 (five) minutes x 3 doses as needed for chest pain. 25 tablet 2  . warfarin (COUMADIN) 5 MG tablet TAKE AS DIRECTED BY COUMADIN CLINIC (Patient taking differently: Take 5 mg by mouth one time only at 6 PM. 7.5 mg on Tues, thurs and sat All the other days take 5 mg) 110 tablet 1   No current facility-administered medications for this visit.      Past Medical History:  Diagnosis Date  . AICD (automatic cardioverter/defibrillator) present   . Atrial fibrillation ([place])   . 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 Conductive hearing loss   . Intraspinal abscess   . Mitral valve insufficiency and aortic valve  insufficiency    s/p MVR mechanical  . Nonischemic cardiomyopathy ([place])   . Psychosexual dysfunction with inhibited sexual excitement   . S/P mitral valve replacement   . Syncope and collapse   . Unspecified sleep apnea    last sleep study 11/07  . Ventricular tachycardia ([place])   . VT (ventricular tachycardia) ([facility])     ROS:   All systems reviewed and negative except as noted in the HPI.   Past Surgical History:  Procedure Laterality Date  . BIV ICD GENERATOR CHANGEOUT N/A 05/04/2017   Procedure: BiV ICD Generator Changeout;  Surgeon: Evans Lance, MD;  Location: [place] [facility];  Service: Cardiovascular;  Laterality: N/A;  . CARDIAC CATHETERIZATION  04/24/2002  . CARDIAC VALVE REPLACEMENT    . CARDIOVERSION  03/09/2012   Procedure: CARDIOVERSION;  Surgeon: Evans Lance, MD;  Location: [place];  Service: Cardiovascular;  Laterality: N/A;  . CARDIOVERSION N/A 11/22/2013   Procedure: CARDIOVERSION;  Surgeon: Sanda Klein, MD;  [facility];  Service: Cardiovascular;  Laterality: N/A;  . dental implants    . ELECTROPHYSIOLOGIC STUDY N/A 09/15/2015   Procedure: V Tach Ablation;  Surgeon: Evans Lance, MD;  Location: [place] [facility];  Service: Cardiovascular;  Laterality: N/A;  . Evacution of epidural lumbar epidural abscess  1999  . INSERT / REPLACE / REMOVE PACEMAKER  11/2008  . MITRAL VALVE REPLACEMENT     w #[address] [facility] [facility]  . PERMANENT PACEMAKER INSERTION N/A 08/05/2011   Procedure: PERMANENT PACEMAKER INSERTION;  Surgeon: Evans Lance, MD;  [facility] CATH LAB;  Service: Cardiovascular;  Laterality: N/A;  . THYROIDECTOMY    . TONSILLECTOMY    . VALVE REPLACEMENT  2000     Family History  Problem Relation Age of Onset  . Heart disease Mother   . Heart failure Mother   . Heart disease Father   . Heart failure Father      Social History   Socioeconomic History  . Marital status: Married    Spouse name: Not on file  . Number of children: 1   . Years of education: 43  . Highest education level: Master's degree (e.g., MA, MS, MEng, MEd, MSW, MBA)  Occupational History  . Occupation: retired from Engineer, manufacturing systems  Social Needs  . Financial resource strain: Not on file  . Food insecurity    Worry: Not on file    Inability: Not on file  . Transportation needs    Medical: Not on file    Non-medical: Not on file  Tobacco Use  . Smoking status: Never Smoker  . Smokeless tobacco: Never Used  Substance and Sexual Activity  . Alcohol use: Yes    Alcohol/week: 0.0 standard drinks    Comment: occasionally  . Drug use: No  . Sexual activity: Not on file  Lifestyle  . Physical activity    Days per week: Not on file    Minutes per session: Not on file  . Stress: Not on file  Relationships  . Social Herbalist on phone: Not on file    Gets together: Not on file    Attends religious service: Not on file    Active member of club or organization: Not on file    Attends meetings of clubs or organizations: Not on file    Relationship status: Not on file  . Intimate partner violence    Fear of current or ex partner: Not on file    Emotionally abused: Not on file    Physically abused: Not on file    Forced sexual activity: Not on file  Other Topics Concern  . Not on file  Social History Narrative   Lives with wife in a one story home.  Has one daughter.  Retired.  Education: Masters.     BP 116/66   Pulse (!) 53   Ht 6\' 1"  (1.854 m)   Wt 201 lb 12.8 oz (91.5 kg)   SpO2 99%   BMI 26.62 kg/m   Physical Exam:  Well appearing 78 yo man, NAD HEENT: Unremarkable Neck:  6 cm JVD, no thyromegally Lymphatics:  No adenopathy Back:  No CVA tenderness Lungs:  Clear with no wheezes HEART:  Regular rate rhythm, no murmurs, no rubs, no clicks, Mechanical S1.  Abd:  soft, positive bowel sounds, no organomegally, no rebound, no guarding Ext:  2 plus pulses, no edema, no cyanosis, no clubbing Skin:  No rashes no  nodules Neuro:  CN II through XII intact, motor grossly intact  EKG - atypical atrial flutter with a controlled VR and RBBB  DEVICE  Normal device function.  See PaceArt for details.   Assess/Plan: 1. VT - he has had none in the last 3 months. He will continue amio 200 mg daily. 2. Chronic systolic heart failure - his symptoms are class  2. He will continue his current meds. 3. Atrial fib/flutter - his rate is controlled. No change in his meds.  Mikle Bosworth.D.

## 2019-01-23 NOTE — Patient Instructions (Addendum)
Medication Instructions:  Your physician recommends that you continue on your current medications as directed. Please refer to the Current Medication list given to you today.  Labwork: None ordered.  Testing/Procedures: None ordered.  Follow-Up: Your physician wants you to follow-up in: early October with Dr. Lovena Le.   You will receive a reminder letter in the mail two months in advance. If you don't receive a letter, please call our office to schedule the follow-up appointment.  Remote monitoring is used to monitor your ICD from home. This monitoring reduces the number of office visits required to check your device to one time per year. It allows [place] to keep an eye on the functioning of your device to ensure it is working properly. You are scheduled for a device check from home on 02/12/2019. You may send your transmission at any time that day. If you have a wireless device, the transmission will be sent automatically. After your physician reviews your transmission, you will receive a postcard with your next transmission date.  Any Other Special Instructions Will Be Listed Below (If Applicable).  If you need a refill on your cardiac medications before your next appointment, please call your pharmacy.

## 2019-01-25 ENCOUNTER — Ambulatory Visit (INDEPENDENT_AMBULATORY_CARE_PROVIDER_SITE_OTHER): Payer: Medicare Other | Admitting: Cardiovascular Disease

## 2019-01-25 DIAGNOSIS — I4819 Other persistent atrial fibrillation: Secondary | ICD-10-CM

## 2019-01-25 DIAGNOSIS — Z5181 Encounter for therapeutic drug level monitoring: Secondary | ICD-10-CM

## 2019-01-25 LAB — POCT INR: INR: 2.7 (ref 2.0–3.0)

## 2019-01-25 NOTE — Patient Instructions (Signed)
Description   Spoke with pt, advised to take 5mg  today, then start taking 1 tablet daily except for 1/2 tablet on Sundays and Thursdays. Recheck INR in 2 weeks. Pt started amiodarone 200mg  BID x4 weeks on 10/14/18, then will decrease to 200mg  daily (started this dose on 12/13/18). Self-tester.

## 2019-02-08 ENCOUNTER — Ambulatory Visit (INDEPENDENT_AMBULATORY_CARE_PROVIDER_SITE_OTHER): Payer: Medicare Other | Admitting: Pharmacist

## 2019-02-08 DIAGNOSIS — Z5181 Encounter for therapeutic drug level monitoring: Secondary | ICD-10-CM

## 2019-02-08 DIAGNOSIS — I4819 Other persistent atrial fibrillation: Secondary | ICD-10-CM

## 2019-02-08 LAB — POCT INR: INR: 2.5 (ref 2.0–3.0)

## 2019-02-12 ENCOUNTER — Ambulatory Visit (INDEPENDENT_AMBULATORY_CARE_PROVIDER_SITE_OTHER): Payer: Medicare Other

## 2019-02-12 DIAGNOSIS — I5022 Chronic systolic (congestive) heart failure: Secondary | ICD-10-CM

## 2019-02-12 DIAGNOSIS — Z95810 Presence of automatic (implantable) cardiac defibrillator: Secondary | ICD-10-CM

## 2019-02-13 ENCOUNTER — Telehealth: Payer: Self-pay

## 2019-02-13 NOTE — Telephone Encounter (Signed)
Spoke with patient to remind of missed remote transmission 

## 2019-02-16 NOTE — Progress Notes (Signed)
EPIC Encounter for ICM Monitoring  Patient Name: Austin [name] is a 78 y.o. male Date: 02/16/2019 Primary Care Physican: Lajean Manes, MD Primary Cardiologist:Smith Electrophysiologist: Lovena Le 01/10/2019 Weight: 200 lbs  Transmission reviewed and results sent via mychart  OptivolThoracic impedancenormal.  Prescribed:Furosemide 20 mgtake 3tablets (60 mg total) daily.  Labs: 12/20/2018 Creatinine 1.47, BUN 26, Potassium 4.5, Sodium 137, GFR 46-53 10/13/2018 Creatinine 1.02, BUN 18, Potassium 3.9, Sodium 139, GFR >60 10/12/2018 Creatinine 1.17, BUN 25, Potassium 4.2, Sodium 132, GFR >60 05/02/2017 Creatinine 1.27, BUN 19, Potassium 4.4, Sodium 137, GFR 55-64 10/28/2015 Creatinine 1.55, BUN 27, Potassium 3.9, Sodium 139, GFR 43-50  Recommendations:None  Follow-up plan: ICM clinic phone appointment on8/25/2020.  Copy of ICM check sent to Dr.Taylor.   3 month ICM trend: 02/13/2019    1 Year ICM trend:       [name], RN 02/16/2019 9:07 AM

## 2019-02-22 ENCOUNTER — Ambulatory Visit (INDEPENDENT_AMBULATORY_CARE_PROVIDER_SITE_OTHER): Payer: Medicare Other | Admitting: Cardiology

## 2019-02-22 DIAGNOSIS — Z5181 Encounter for therapeutic drug level monitoring: Secondary | ICD-10-CM | POA: Diagnosis not present

## 2019-02-22 DIAGNOSIS — I4819 Other persistent atrial fibrillation: Secondary | ICD-10-CM

## 2019-02-22 LAB — POCT INR: INR: 3.5 — AB (ref 2.0–3.0)

## 2019-02-22 NOTE — Patient Instructions (Addendum)
Description   Spoke with pt and advised to continue taking 1 tablet daily except for 1/2 tablet on Sundays and Thursdays (the dose he was supposed to take). Recheck INR in 2 weeks. Pt started amiodarone 200mg  BID x4 weeks on 10/14/18, then will decrease to 200mg  daily (started this dose on 12/13/18). Self-tester.

## 2019-03-08 ENCOUNTER — Ambulatory Visit (INDEPENDENT_AMBULATORY_CARE_PROVIDER_SITE_OTHER): Payer: Medicare Other | Admitting: Cardiology

## 2019-03-08 DIAGNOSIS — I4819 Other persistent atrial fibrillation: Secondary | ICD-10-CM | POA: Diagnosis not present

## 2019-03-08 DIAGNOSIS — Z5181 Encounter for therapeutic drug level monitoring: Secondary | ICD-10-CM

## 2019-03-08 LAB — POCT INR: INR: 3.7 — AB (ref 2.0–3.0)

## 2019-03-08 NOTE — Patient Instructions (Signed)
Description   Spoke with pt and advised to skip warfarin today, then continue taking 1 tablet daily except for 1/2 tablet on Sundays and Thursdays. Recheck INR in 2 weeks. Pt started amiodarone 200mg  BID x4 weeks on 10/14/18, then will decrease to 200mg  daily (started this dose on 12/13/18). Self-tester.

## 2019-03-20 ENCOUNTER — Ambulatory Visit (INDEPENDENT_AMBULATORY_CARE_PROVIDER_SITE_OTHER): Payer: Medicare Other

## 2019-03-20 DIAGNOSIS — Z95810 Presence of automatic (implantable) cardiac defibrillator: Secondary | ICD-10-CM | POA: Diagnosis not present

## 2019-03-20 DIAGNOSIS — I5022 Chronic systolic (congestive) heart failure: Secondary | ICD-10-CM

## 2019-03-21 ENCOUNTER — Ambulatory Visit (INDEPENDENT_AMBULATORY_CARE_PROVIDER_SITE_OTHER): Payer: Medicare Other | Admitting: *Deleted

## 2019-03-21 DIAGNOSIS — I5022 Chronic systolic (congestive) heart failure: Secondary | ICD-10-CM

## 2019-03-21 DIAGNOSIS — I472 Ventricular tachycardia, unspecified: Secondary | ICD-10-CM

## 2019-03-21 LAB — CUP PACEART REMOTE DEVICE CHECK
Battery Remaining Longevity: 110 mo
Battery Voltage: 3 V
Brady Statistic AP VP Percent: 0 %
Brady Statistic AP VS Percent: 0 %
Brady Statistic AS VP Percent: 40 %
Brady Statistic AS VS Percent: 60 %
Brady Statistic RA Percent Paced: 0 %
Brady Statistic RV Percent Paced: 35.86 %
Date Time Interrogation Session: 20200826091854
HighPow Impedance: 77 Ohm
Implantable Lead Implant Date: 20130110
Implantable Lead Implant Date: 20130110
Implantable Lead Location: 753859
Implantable Lead Location: 753860
Implantable Lead Model: 5076
Implantable Lead Model: 6935
Implantable Pulse Generator Implant Date: 20181010
Lead Channel Impedance Value: 285 Ohm
Lead Channel Impedance Value: 361 Ohm
Lead Channel Impedance Value: 418 Ohm
Lead Channel Pacing Threshold Amplitude: 0.75 V
Lead Channel Pacing Threshold Pulse Width: 0.4 ms
Lead Channel Sensing Intrinsic Amplitude: 3.25 mV
Lead Channel Sensing Intrinsic Amplitude: 3.25 mV
Lead Channel Sensing Intrinsic Amplitude: 3.5 mV
Lead Channel Sensing Intrinsic Amplitude: 3.5 mV
Lead Channel Setting Pacing Amplitude: 2.5 V
Lead Channel Setting Pacing Pulse Width: 0.4 ms
Lead Channel Setting Sensing Sensitivity: 0.3 mV

## 2019-03-22 ENCOUNTER — Ambulatory Visit (INDEPENDENT_AMBULATORY_CARE_PROVIDER_SITE_OTHER): Payer: Medicare Other

## 2019-03-22 DIAGNOSIS — I4819 Other persistent atrial fibrillation: Secondary | ICD-10-CM

## 2019-03-22 DIAGNOSIS — Z5181 Encounter for therapeutic drug level monitoring: Secondary | ICD-10-CM | POA: Diagnosis not present

## 2019-03-22 LAB — POCT INR: INR: 2.5 (ref 2.0–3.0)

## 2019-03-22 NOTE — Patient Instructions (Signed)
Description   Spoke with pt and advised to take 1 tablet today, then resume same dosage 1 tablet daily except for 1/2 tablet on Sundays and Thursdays. Recheck INR in 2 weeks. Self-tester.

## 2019-03-23 NOTE — Progress Notes (Signed)
EPIC Encounter for ICM Monitoring  Patient Name: Austin [name] is a 78 y.o. male Date: 03/23/2019 Primary Care Physican: Lajean Manes, MD Primary Cardiologist:Smith Electrophysiologist: Lovena Le 03/23/2019 Weight: 200 lbs  Spoke with patient.  He said he is doing well and denies any fluid symptoms.  He feels Amiodarone causes brain fog for him and is hoping Dr Lovena Le can decrease the dosage at Oct visit.  OptivolThoracic impedancenormal.  Prescribed:Furosemide 20 mgtake 3tablets (60 mg total) daily.  Labs: 01/01/2019 Creatinine 1.38, BUN 27, Potassium 4.6, Sodium 137, GFR 49-57 12/20/2018 Creatinine 1.47, BUN 26, Potassium 4.5, Sodium 137, GFR 46-53 10/13/2018 Creatinine 1.02, BUN 18, Potassium 3.9, Sodium 139, GFR >60 10/12/2018 Creatinine 1.17, BUN 25, Potassium 4.2, Sodium 132, GFR >60 05/02/2017 Creatinine 1.27, BUN 19, Potassium 4.4, Sodium 137, GFR 55-64 10/28/2015 Creatinine 1.55, BUN 27, Potassium 3.9, Sodium 139, GFR 43-50  Recommendations:No changes and encouraged to call if experiencing any fluid symptoms.  Follow-up plan: ICM clinic phone appointment on10/27/2020.OV with Dr Lovena Le 05/02/2019.  Copy of ICM check sent to Dr.Taylor.   3 month ICM trend: 03/21/2019    1 Year ICM trend:       [name], RN 03/23/2019 2:00 PM

## 2019-03-26 ENCOUNTER — Other Ambulatory Visit: Payer: Self-pay

## 2019-03-26 ENCOUNTER — Other Ambulatory Visit: Payer: Self-pay | Admitting: Interventional Cardiology

## 2019-03-26 ENCOUNTER — Telehealth: Payer: Self-pay | Admitting: Interventional Cardiology

## 2019-03-26 MED ORDER — WARFARIN SODIUM 5 MG PO TABS: 5.0000 mg | ORAL_TABLET | Freq: Once | ORAL | 0 refills | Status: DC

## 2019-03-26 MED ORDER — WARFARIN SODIUM 5 MG PO TABS: 5.0000 mg | ORAL_TABLET | Freq: Every day | ORAL | 0 refills | Status: DC

## 2019-03-26 NOTE — Progress Notes (Signed)
Virtual Visit via Video Note   This visit type was conducted due to national recommendations for restrictions regarding the COVID-19 Pandemic (e.g. social distancing) in an effort to limit this patient's exposure and mitigate transmission in our community.  Due to his co-morbid illnesses, this patient is at least at moderate risk for complications without adequate follow up.  This format is felt to be most appropriate for this patient at this time.  All issues noted in this document were discussed and addressed.  A limited physical exam was performed with this format.  Please refer to the patient's chart for his consent to telehealth for [facility].   Date:  03/26/2019   ID:  Austin [name], DOB [birth year], MRN [id number]  Patient Location: Home Provider Location: Home  PCP:  Lajean Manes, MD  Cardiologist:  [name], MD  Electrophysiologist:  None   Evaluation Performed:  Follow-Up Visit  Chief Complaint:  MVR/VT/AICD/CCSDHF  History of Present Illness:    Austin [name] is a 78 y.o. male with  mechanical mitral valve, chronic anticoagulation therapy, nonischemic cardiomyopathy, biventricular implantable cardiac defibrillator, recurrent ventricular tachycardia, amiodarone for VT suppression, multiple medication regimen to suppress ventricular ectopy, and sleep apnea.  This is my first visit with Austin [name] in over 12 months.  He has been seen primarily by electrophysiology, Dr. [name].  He is gaining weight.  He has not been very active.  He was off of amiodarone for 12 months and did remarkably better.  "Brain fog and leg fatigue" dissipated off the medication.  More recently he had a recurrence of ventricular arrhythmias that required AICD shock.  Amiodarone was resumed in March.  He now states that he has brain fog and is unable to do much physical activity before his legs give out.  These symptoms almost completely resolved off of amiodarone.  He denies orthopnea,  PND, lower extremity swelling.  He has not had chest pain.  He has had no significant transient neurological findings or complaints.   The patient does not have symptoms concerning for COVID-19 infection (fever, chills, cough, or new shortness of breath).    Past Medical History:  Diagnosis Date  . AICD (automatic cardioverter/defibrillator) present   . Atrial fibrillation ([place])   . 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 Conductive hearing loss   . Intraspinal abscess   . 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 Nonischemic cardiomyopathy ([place])   . Psychosexual dysfunction with inhibited sexual excitement   . S/P mitral valve replacement   . Syncope and collapse   . Unspecified sleep apnea    last sleep study 11/07  . Ventricular tachycardia ([place])   . VT (ventricular tachycardia) ([place])    Past Surgical History:  Procedure Laterality Date  . BIV ICD GENERATOR CHANGEOUT N/A 05/04/2017   Procedure: BiV ICD Generator Changeout;  Surgeon: Evans Lance, MD;  Location: [place] [facility];  Service: Cardiovascular;  Laterality: N/A;  . CARDIAC CATHETERIZATION  04/24/2002  . CARDIAC VALVE REPLACEMENT    . CARDIOVERSION  03/09/2012   Procedure: CARDIOVERSION;  Surgeon: Evans Lance, MD;  Location: [place];  Service: Cardiovascular;  Laterality: N/A;  . CARDIOVERSION N/A 11/22/2013   Procedure: CARDIOVERSION;  Surgeon: Sanda Klein, MD;  Location: [facility];  Service: Cardiovascular;  Laterality: N/A;  . dental implants    . ELECTROPHYSIOLOGIC STUDY  N/A 09/15/2015   Procedure: Stephanie Coup Ablation;  Surgeon: Evans Lance, MD;  Location: [place] [facility];  Service: Cardiovascular;  Laterality: N/A;  . Evacution of epidural lumbar epidural abscess  1999  . INSERT / REPLACE / REMOVE PACEMAKER  11/2008  . MITRAL VALVE REPLACEMENT     w #[address] [facility]  .  PERMANENT PACEMAKER INSERTION N/A 08/05/2011   Procedure: PERMANENT PACEMAKER INSERTION;  Surgeon: Evans Lance, MD;  Location: [facility] CATH LAB;  Service: Cardiovascular;  Laterality: N/A;  . THYROIDECTOMY    . TONSILLECTOMY    . VALVE REPLACEMENT  2000     No outpatient medications have been marked as taking for the 03/27/19 encounter (Appointment) with Belva Crome, MD.     Allergies:   Patient has no known allergies.   Social History   Tobacco Use  . Smoking status: Never Smoker  . Smokeless tobacco: Never Used  Substance Use Topics  . Alcohol use: Yes    Alcohol/week: 0.0 standard drinks    Comment: occasionally  . Drug use: No     Family Hx: The patient's family history includes Heart disease in his father and mother; Heart failure in his father and mother.  ROS:   Please see the history of present illness.    Depressed.  He states that between the COVID-19 pandemic, inability to be physically active, and concerned about ventricular tachycardia and recurrent device discharges he is "2 or 3 [place] fries short of a happy meal". All other systems reviewed and are negative.   Prior CV studies:   The following studies were reviewed today:  2 D Doppler Echocardiogram 10/13/2018 IMPRESSIONS    1. The left ventricle has moderately reduced systolic function, with an ejection fraction of 35-40%. The cavity size was normal. Left ventricular diastolic function could not be evaluated due to nondiagnostic images.  2. The right ventricle has normal systolic function. The cavity was normal. There is mildly increased right ventricular wall thickness.  3. Left atrial size was severely dilated.  4. Right atrial size was moderately dilated.  5. Mild thickening of the mitral valve leaflet. Mild calcification of the mitral valve leaflet. There is moderate mitral annular calcification present.  6. The tricuspid valve is grossly normal.  7. The aortic valve is tricuspid Mild thickening of  the aortic valve Mild calcification of the aortic valve.  8. There is mild dilatation of the ascending aorta.  9. The interatrial septum was not assessed.  Labs/Other Tests and Data Reviewed:    EKG:  No ECG reviewed.  Recent Labs: 04/18/2018: TSH 2.490 10/12/2018: Hemoglobin 14.8; Platelets 159 10/13/2018: ALT 18 10/14/2018: Magnesium 2.3 01/01/2019: BUN 27; Creatinine, Ser 1.38; Potassium 4.6; Sodium 137   Recent Lipid Panel Lab Results  Component Value Date/Time   CHOL 158 08/23/2015 05:43 AM   TRIG 130 08/23/2015 05:43 AM   HDL 50 08/23/2015 05:43 AM   CHOLHDL 3.2 08/23/2015 05:43 AM   LDLCALC 82 08/23/2015 05:43 AM    Wt Readings from Last 3 Encounters:  01/23/19 201 lb 12.8 oz (91.5 kg)  10/14/18 202 lb 1.6 oz (91.7 kg)  09/26/18 203 lb (92.1 kg)     Objective:    Vital Signs:  There were no vitals taken for this visit.   VITAL SIGNS:  reviewed GEN:  no acute distress RESPIRATORY:  normal respiratory effort, symmetric expansion CARDIOVASCULAR:  no peripheral edema PSYCH:  Depressed affect  ASSESSMENT & PLAN:    1.  Other persistent atrial fibrillation   2. Chronic systolic heart failure ([place])   3. ICD (implantable cardioverter-defibrillator) in place   4. VT (ventricular tachycardia) ([place])   5. S/P mitral valve replacement   6. Encounter for therapeutic drug monitoring   7. Educated About Covid-19 Virus Infection    PLAN:  1. Not addressed 2. No clinical signs or symptoms to suggest volume overload LVEF was 35 to 40% by echo in March. 3. Had several AICD discharges in March.  None since reinstitution of amiodarone. 4. Mitral valve function appears normal based on recent echo.  The study was personally reviewed. 5. He is on amiodarone and feels certain that it is contributing to exertional fatigue/muscle weakness as well as a sense of "brain fog".  He would like to get off of amiodarone but I am not optimistic if he is not been offered repeat ablation or other  agents to suppress malignant ventricular arrhythmia. 6. Social distancing, handwashing, and mask wearing is being observed.  COVID-19 Education: The signs and symptoms of COVID-19 were discussed with the patient and how to seek care for testing (follow up with PCP or arrange E-visit).   The importance of social distancing was discussed today.  Time:   Today, I have spent 15 minutes with the patient with telehealth technology discussing the above problems.     Medication Adjustments/Labs and Tests Ordered: Current medicines are reviewed at length with the patient today.  Concerns regarding medicines are outlined above.   Tests Ordered: No orders of the defined types were placed in this encounter.   Medication Changes: No orders of the defined types were placed in this encounter.   Follow Up:  In Person in 8 month(s)  Signed, Sinclair Grooms, MD  03/26/2019 9:20 PM    [place]

## 2019-03-26 NOTE — Telephone Encounter (Signed)

## 2019-03-27 ENCOUNTER — Other Ambulatory Visit: Payer: Self-pay

## 2019-03-27 ENCOUNTER — Telehealth (INDEPENDENT_AMBULATORY_CARE_PROVIDER_SITE_OTHER): Payer: Medicare Other | Admitting: Interventional Cardiology

## 2019-03-27 ENCOUNTER — Encounter: Payer: Self-pay | Admitting: Interventional Cardiology

## 2019-03-27 VITALS — BP 124/73 | HR 40 | Ht 73.0 in | Wt 200.0 lb

## 2019-03-27 DIAGNOSIS — Z952 Presence of prosthetic heart valve: Secondary | ICD-10-CM

## 2019-03-27 DIAGNOSIS — I5022 Chronic systolic (congestive) heart failure: Secondary | ICD-10-CM

## 2019-03-27 DIAGNOSIS — I472 Ventricular tachycardia, unspecified: Secondary | ICD-10-CM

## 2019-03-27 DIAGNOSIS — I4819 Other persistent atrial fibrillation: Secondary | ICD-10-CM | POA: Diagnosis not present

## 2019-03-27 DIAGNOSIS — Z5181 Encounter for therapeutic drug level monitoring: Secondary | ICD-10-CM

## 2019-03-27 DIAGNOSIS — Z95810 Presence of automatic (implantable) cardiac defibrillator: Secondary | ICD-10-CM

## 2019-03-27 DIAGNOSIS — Z7189 Other specified counseling: Secondary | ICD-10-CM

## 2019-03-27 NOTE — Patient Instructions (Signed)
Medication Instructions:  Your physician recommends that you continue on your current medications as directed. Please refer to the Current Medication list given to you today.  If you need a refill on your cardiac medications before your next appointment, please call your pharmacy.   Lab work: None If you have labs (blood work) drawn today and your tests are completely normal, you will receive your results only by: . MyChart Message (if you have MyChart) OR . A paper copy in the mail If you have any lab test that is abnormal or we need to change your treatment, we will call you to review the results.  Testing/Procedures: None  Follow-Up: At [facility], you and your health needs are our priority.  As part of our continuing mission to provide you with exceptional heart care, we have created designated Provider Care Teams.  These Care Teams include your primary Cardiologist (physician) and Advanced Practice Providers (APPs -  Physician Assistants and Nurse Practitioners) who all work together to provide you with the care you need, when you need it. You will need a follow up appointment in 9-12 months.  Please call our office 2 months in advance to schedule this appointment.  You may see Dr. Smith or one of the following Advanced Practice Providers on your designated Care Team:   Lori Gerhardt, NP Laura Ingold, NP . Jill McDaniel, NP  Any Other Special Instructions Will Be Listed Below (If Applicable).    

## 2019-03-30 NOTE — Progress Notes (Signed)
Remote ICD transmission.   

## 2019-04-05 ENCOUNTER — Ambulatory Visit (INDEPENDENT_AMBULATORY_CARE_PROVIDER_SITE_OTHER): Payer: Medicare Other | Admitting: Cardiovascular Disease

## 2019-04-05 DIAGNOSIS — I4819 Other persistent atrial fibrillation: Secondary | ICD-10-CM

## 2019-04-05 DIAGNOSIS — Z5181 Encounter for therapeutic drug level monitoring: Secondary | ICD-10-CM | POA: Diagnosis not present

## 2019-04-05 LAB — POCT INR: INR: 3.5 — AB (ref 2.0–3.0)

## 2019-04-19 ENCOUNTER — Ambulatory Visit (INDEPENDENT_AMBULATORY_CARE_PROVIDER_SITE_OTHER): Payer: Medicare Other

## 2019-04-19 DIAGNOSIS — Z5181 Encounter for therapeutic drug level monitoring: Secondary | ICD-10-CM

## 2019-04-19 DIAGNOSIS — I4819 Other persistent atrial fibrillation: Secondary | ICD-10-CM

## 2019-04-19 LAB — POCT INR: INR: 3.7 — AB (ref 2.0–3.0)

## 2019-04-19 NOTE — Patient Instructions (Signed)
Description   Spoke with pt and advised to take 1/2 tablet today and tomorrow, then resume same dosage 1 tablet daily except for 1/2 tablet on Sundays and Thursdays. Recheck INR in 2 weeks. Self-tester.

## 2019-04-24 ENCOUNTER — Other Ambulatory Visit: Payer: Self-pay | Admitting: Internal Medicine

## 2019-04-24 DIAGNOSIS — Z9889 Other specified postprocedural states: Secondary | ICD-10-CM

## 2019-05-01 ENCOUNTER — Telehealth: Payer: Self-pay | Admitting: Internal Medicine

## 2019-05-01 NOTE — Telephone Encounter (Signed)
[facility] is requesting a refill on Amoxicillin 500 mg tablet for a dental appt. Please address

## 2019-05-02 ENCOUNTER — Encounter: Payer: Self-pay | Admitting: Internal Medicine

## 2019-05-02 ENCOUNTER — Ambulatory Visit (INDEPENDENT_AMBULATORY_CARE_PROVIDER_SITE_OTHER): Payer: Medicare Other | Admitting: Internal Medicine

## 2019-05-02 ENCOUNTER — Other Ambulatory Visit: Payer: Self-pay

## 2019-05-02 VITALS — BP 108/70 | HR 51 | Ht 73.0 in | Wt 206.0 lb

## 2019-05-02 DIAGNOSIS — I472 Ventricular tachycardia, unspecified: Secondary | ICD-10-CM

## 2019-05-02 DIAGNOSIS — I4819 Other persistent atrial fibrillation: Secondary | ICD-10-CM

## 2019-05-02 DIAGNOSIS — Z95810 Presence of automatic (implantable) cardiac defibrillator: Secondary | ICD-10-CM

## 2019-05-02 DIAGNOSIS — I428 Other cardiomyopathies: Secondary | ICD-10-CM

## 2019-05-02 LAB — CUP PACEART INCLINIC DEVICE CHECK
Battery Remaining Longevity: 109 mo
Battery Voltage: 3 V
Brady Statistic AP VP Percent: 0 %
Brady Statistic AP VS Percent: 0 %
Brady Statistic AS VP Percent: 21.83 %
Brady Statistic AS VS Percent: 78.17 %
Brady Statistic RA Percent Paced: 0 %
Brady Statistic RV Percent Paced: 26.09 %
Date Time Interrogation Session: 20201007161201
HighPow Impedance: 81 Ohm
Implantable Lead Implant Date: 20130110
Implantable Lead Implant Date: 20130110
Implantable Lead Location: 753859
Implantable Lead Location: 753860
Implantable Lead Model: 5076
Implantable Lead Model: 6935
Implantable Pulse Generator Implant Date: 20181010
Lead Channel Impedance Value: 361 Ohm
Lead Channel Impedance Value: 399 Ohm
Lead Channel Impedance Value: 456 Ohm
Lead Channel Pacing Threshold Amplitude: 0.875 V
Lead Channel Pacing Threshold Pulse Width: 0.4 ms
Lead Channel Sensing Intrinsic Amplitude: 3.25 mV
Lead Channel Sensing Intrinsic Amplitude: 3.25 mV
Lead Channel Sensing Intrinsic Amplitude: 3.5 mV
Lead Channel Sensing Intrinsic Amplitude: 5.3 mV
Lead Channel Setting Pacing Amplitude: 2.5 V
Lead Channel Setting Pacing Pulse Width: 0.4 ms
Lead Channel Setting Sensing Sensitivity: 0.3 mV

## 2019-05-02 MED ORDER — AMOXICILLIN 500 MG PO TABS: 2000.0000 mg | ORAL_TABLET | Freq: Once | ORAL | 3 refills | Status: AC

## 2019-05-02 MED ORDER — FUROSEMIDE 20 MG PO TABS: ORAL_TABLET | ORAL | 3 refills | Status: DC

## 2019-05-02 MED ORDER — AMIODARONE HCL 200 MG PO TABS: ORAL_TABLET | ORAL | 3 refills | Status: DC

## 2019-05-02 NOTE — Progress Notes (Signed)
HPI [name] returns today for followup. 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 He has had recurrent VT. He underwent VT ablation. He did well for a couple of years but then had more VT and has had his amio restarted. He c/o feeling foggy in the morning. He denies chest pain or sob. He has difficulty with his equilibrium but no syncope or falls. He feels sleepy on neurontin.  No Known Allergies   Current Outpatient Medications  Medication Sig Dispense Refill  . acetaminophen (TYLENOL) 500 MG tablet Take 1,000 mg by mouth every 6 (six) hours as needed (pain).    Marland Kitchen gabapentin (NEURONTIN) 100 MG capsule Take 1 capsule by mouth 3 (three) times daily.  0  . levothyroxine (SYNTHROID) 50 MCG tablet TAKE 1 TABLET (50 MCG TOTAL) BY MOUTH DAILY BEFORE BREAKFAST. 90 tablet 2  . lisinopril (PRINIVIL,ZESTRIL) 10 MG tablet Take 10 mg by mouth at bedtime.   9  . Multiple Vitamin (MULTIVITAMIN WITH MINERALS) TABS tablet Take 1 tablet by mouth daily.    . nitroGLYCERIN (NITROSTAT) 0.4 MG SL tablet Place 1 tablet (0.4 mg total) under the tongue every 5 (five) minutes x 3 doses as needed for chest pain. 25 tablet 2  . warfarin (COUMADIN) 5 MG tablet Take 1 tablet (5 mg total) by mouth daily. 90 tablet 0   No current facility-administered medications for this visit.      Past Medical History:  Diagnosis Date  . AICD (automatic cardioverter/defibrillator) present   . Atrial fibrillation ([place])   . 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 Conductive hearing loss   . Intraspinal abscess   . 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 Nonischemic cardiomyopathy ([place])   . Psychosexual dysfunction with inhibited sexual excitement   . S/P mitral  valve replacement   . Syncope and collapse   . Unspecified sleep apnea    last sleep study 11/07  . Ventricular tachycardia ([place])   . VT (ventricular tachycardia) ([facility])     ROS:   All systems reviewed and negative except as noted in the HPI.   Past Surgical History:  Procedure Laterality Date  . BIV ICD GENERATOR CHANGEOUT N/A 05/04/2017   Procedure: BiV ICD Generator Changeout;  Surgeon: Evans Lance, MD;  Location: [facility];  Service: Cardiovascular;  Laterality: N/A;  . CARDIAC CATHETERIZATION  04/24/2002  . CARDIAC VALVE REPLACEMENT    . CARDIOVERSION  03/09/2012   Procedure: CARDIOVERSION;  Surgeon: Evans Lance, MD;  Location: [facility];  Service: Cardiovascular;  Laterality: N/A;  . CARDIOVERSION N/A 11/22/2013   Procedure: CARDIOVERSION;  Surgeon: Sanda Klein, MD;  [facility]: [facility];  Service: Cardiovascular;  Laterality: N/A;  . dental implants    . ELECTROPHYSIOLOGIC STUDY N/A 09/15/2015   Procedure: V Tach Ablation;  Surgeon: Evans Lance, MD;  Location: [facility];  Service: Cardiovascular;  Laterality: N/A;  . Evacution of epidural lumbar epidural abscess  1999  . INSERT / REPLACE / REMOVE PACEMAKER  11/2008  . MITRAL VALVE REPLACEMENT     w #[address] [facility]  . PERMANENT PACEMAKER INSERTION N/A 08/05/2011   Procedure: PERMANENT PACEMAKER INSERTION;  Surgeon: Evans Lance,  MD;  Location: [facility];  Service: Cardiovascular;  Laterality: N/A;  . THYROIDECTOMY    . TONSILLECTOMY    . VALVE REPLACEMENT  2000     Family History  Problem Relation Age of Onset  . Heart disease Mother   . Heart failure Mother   . Heart disease Father   . Heart failure Father      Social History   Socioeconomic History  . Marital status: Married    Spouse name: Not on file  . Number of children: 1  . Years of education: 83  . Highest education level: Master's degree (e.g., MA, MS, MEng, MEd, MSW, MBA)  Occupational History  . Occupation:  retired from Engineer, manufacturing systems  Social Needs  . Financial resource strain: Not on file  . Food insecurity    Worry: Not on file    Inability: Not on file  . Transportation needs    Medical: Not on file    Non-medical: Not on file  Tobacco Use  . Smoking status: Never Smoker  . Smokeless tobacco: Never Used  Substance and Sexual Activity  . Alcohol use: Yes    Alcohol/week: 0.0 standard drinks    Comment: occasionally  . Drug use: No  . Sexual activity: Not on file  Lifestyle  . Physical activity    Days per week: Not on file    Minutes per session: Not on file  . Stress: Not on file  Relationships  . Social Herbalist on phone: Not on file    Gets together: Not on file    Attends religious service: Not on file    Active member of club or organization: Not on file    Attends meetings of clubs or organizations: Not on file    Relationship status: Not on file  . Intimate partner violence    Fear of current or ex partner: Not on file    Emotionally abused: Not on file    Physically abused: Not on file    Forced sexual activity: Not on file  Other Topics Concern  . Not on file  Social History Narrative   Lives with wife in a one story home.  Has one daughter.  Retired.  Education: Masters.     BP 108/70   Pulse (!) 51   Ht 6\' 1"  (1.854 m)   Wt 206 lb (93.4 kg)   SpO2 98%   BMI 27.18 kg/m   Physical Exam:  Well appearing NAD HEENT: Unremarkable Neck:  No JVD, no thyromegally Lymphatics:  No adenopathy Back:  No CVA tenderness Lungs:  Clear with no wheezes HEART:  Regular rate rhythm, no murmurs, no rubs, no clicks Abd:  soft, positive bowel sounds, no organomegally, no rebound, no guarding Ext:  2 plus pulses, no edema, no cyanosis, no clubbing Skin:  No rashes no nodules Neuro:  CN II through XII intact, motor grossly intact  EKG - atrial fib/flutter with a controlled VR  DEVICE  Normal device function.  See PaceArt for details.    Assess/Plan: 1. VT - he has had none. I will reduce the dose of amio to 200 mg daily Mon. Through Saturday, none on Sunday. 2. Atrial fib - his rates are controlled. 3. 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 He is euvolemic and he will reduce his dose of lasix. His weight has been stable and I asked him to follow his daily weight and if they go back  up, he is instructed to increase the dose. 4. ICD - his medtronic DDD ICD is working normally.   Mikle Bosworth.D.

## 2019-05-02 NOTE — Telephone Encounter (Signed)
Pt has appt today will discuss

## 2019-05-02 NOTE — Patient Instructions (Addendum)
Medication Instructions:  Your physician has recommended you make the following change in your medication:   1.  Reduce your amiodarone 200 mg-  Take one tablet by mouth daily Monday through Saturday.  Do NOT take on Sunday.  2.  Reduce your furosemide 20 mg---Take 2 tablets (40 mg) by mouth daily.  If your weight increases take 3 tablets (60 mg)   Labwork: None ordered.  Testing/Procedures: None ordered.  Follow-Up: Your physician wants you to follow-up in: 4 months with Dr. Lovena Le.   You will receive a reminder letter in the mail two months in advance. If you don't receive a letter, please call our office to schedule the follow-up appointment.  Remote monitoring is used to monitor your ICD from home. This monitoring reduces the number of office visits required to check your device to one time per year. It allows [place] to keep an eye on the functioning of your device to ensure it is working properly. You are scheduled for a device check from home on 05/22/2019. You may send your transmission at any time that day. If you have a wireless device, the transmission will be sent automatically. After your physician reviews your transmission, you will receive a postcard with your next transmission date.  Any Other Special Instructions Will Be Listed Below (If Applicable).  If you need a refill on your cardiac medications before your next appointment, please call your pharmacy.

## 2019-05-03 ENCOUNTER — Ambulatory Visit (INDEPENDENT_AMBULATORY_CARE_PROVIDER_SITE_OTHER): Payer: Medicare Other | Admitting: Internal Medicine

## 2019-05-03 ENCOUNTER — Telehealth: Payer: Self-pay | Admitting: Interventional Cardiology

## 2019-05-03 DIAGNOSIS — Z5181 Encounter for therapeutic drug level monitoring: Secondary | ICD-10-CM

## 2019-05-03 DIAGNOSIS — I4819 Other persistent atrial fibrillation: Secondary | ICD-10-CM | POA: Diagnosis not present

## 2019-05-03 LAB — POCT INR: INR: 4.7 — AB (ref 2.0–3.0)

## 2019-05-03 NOTE — Telephone Encounter (Signed)
New  Message   Per Ashby Dawes at Remote INR the patient's INR result was 4.7. Please call with any questions.

## 2019-05-03 NOTE — Patient Instructions (Signed)
Description   Spoke with pt and advised to hold today's dose then start taking 1 tablet daily except for 1/2 tablet on Sundays, Tuesdays, and Thursdays. Recheck INR in 2 weeks. Self-tester.

## 2019-05-03 NOTE — Telephone Encounter (Signed)
Spoke with pt and dosed INR earlier today and called INR remote services. Spoke with Roderic Ovens and she is stated they were just calling to let us know the INR.

## 2019-05-17 ENCOUNTER — Ambulatory Visit (INDEPENDENT_AMBULATORY_CARE_PROVIDER_SITE_OTHER): Payer: Medicare Other

## 2019-05-17 DIAGNOSIS — Z5181 Encounter for therapeutic drug level monitoring: Secondary | ICD-10-CM | POA: Diagnosis not present

## 2019-05-17 DIAGNOSIS — I4819 Other persistent atrial fibrillation: Secondary | ICD-10-CM

## 2019-05-17 LAB — POCT INR: INR: 3.1 — AB (ref 2.0–3.0)

## 2019-05-22 ENCOUNTER — Telehealth: Payer: Self-pay

## 2019-05-22 ENCOUNTER — Ambulatory Visit (INDEPENDENT_AMBULATORY_CARE_PROVIDER_SITE_OTHER): Payer: Medicare Other

## 2019-05-22 DIAGNOSIS — Z95810 Presence of automatic (implantable) cardiac defibrillator: Secondary | ICD-10-CM | POA: Diagnosis not present

## 2019-05-22 NOTE — Telephone Encounter (Signed)
Left message for patient to remind of missed remote transmission.  

## 2019-05-25 NOTE — Progress Notes (Signed)
EPIC Encounter for ICM Monitoring  Patient Name: Austin [name] is a 78 y.o. male Date: 05/25/2019 Primary Care Physican: Lajean Manes, MD Primary Cardiologist:Smith Electrophysiologist: Lovena Le 05/02/2019 Weight: 206 lbs  Spoke with patient.  He said he is doing well and denies any fluid symptoms.    OptivolThoracic impedancenormal.  Prescribed:Furosemide 20 mgTake 2 tablets (40 mg total) by mouth daily. May take an extra tablet as needed for weight gain.  Labs: 01/01/2019 Creatinine 1.38, BUN 27, Potassium 4.6, Sodium 137, GFR 49-57 12/20/2018 Creatinine 1.47, BUN 26, Potassium 4.5, Sodium 137, GFR 46-53 10/13/2018 Creatinine 1.02, BUN 18, Potassium 3.9, Sodium 139, GFR >60  Recommendations: No changes and encouraged to call if experiencing any fluid symptoms.  Follow-up plan: ICM clinic phone appointment on 06/26/2019.   91 day device clinic remote transmission 06/25/2019.      Copy of ICM check sent to Dr. Lovena Le.   3 month ICM trend: 05/22/2019    1 Year ICM trend:       [name], RN 05/25/2019 1:18 PM

## 2019-05-31 ENCOUNTER — Ambulatory Visit (INDEPENDENT_AMBULATORY_CARE_PROVIDER_SITE_OTHER): Payer: Medicare Other

## 2019-05-31 DIAGNOSIS — Z5181 Encounter for therapeutic drug level monitoring: Secondary | ICD-10-CM | POA: Diagnosis not present

## 2019-05-31 DIAGNOSIS — I4819 Other persistent atrial fibrillation: Secondary | ICD-10-CM | POA: Diagnosis not present

## 2019-05-31 LAB — POCT INR: INR: 2.7 (ref 2.0–3.0)

## 2019-05-31 NOTE — Patient Instructions (Signed)
Description   Spoke with pt and advised to take 5mg  today, then start taking 1 tablet daily except for 1/2 tablet on Sundays and Thursdays. Recheck INR in 2 weeks. Self-tester.

## 2019-06-14 ENCOUNTER — Ambulatory Visit (INDEPENDENT_AMBULATORY_CARE_PROVIDER_SITE_OTHER): Payer: Medicare Other | Admitting: *Deleted

## 2019-06-14 DIAGNOSIS — Z5181 Encounter for therapeutic drug level monitoring: Secondary | ICD-10-CM

## 2019-06-14 DIAGNOSIS — I4819 Other persistent atrial fibrillation: Secondary | ICD-10-CM | POA: Diagnosis not present

## 2019-06-14 LAB — POCT INR: INR: 3.7 — AB (ref 2.0–3.0)

## 2019-06-14 NOTE — Patient Instructions (Signed)
Description   Spoke with pt and advised to have pt take 1/2 tablet tomorrow (already taken today's dose) then continue taking 1 tablet daily except for 1/2 tablet on Sundays and Thursdays. Recheck INR in 2 weeks. Self-tester.

## 2019-06-25 ENCOUNTER — Ambulatory Visit (INDEPENDENT_AMBULATORY_CARE_PROVIDER_SITE_OTHER): Payer: Medicare Other | Admitting: *Deleted

## 2019-06-25 DIAGNOSIS — I5022 Chronic systolic (congestive) heart failure: Secondary | ICD-10-CM

## 2019-06-25 LAB — CUP PACEART REMOTE DEVICE CHECK
Battery Remaining Longevity: 107 mo
Battery Voltage: 3 V
Brady Statistic AP VP Percent: 0 %
Brady Statistic AP VS Percent: 0 %
Brady Statistic AS VP Percent: 18.12 %
Brady Statistic AS VS Percent: 81.88 %
Brady Statistic RA Percent Paced: 0 %
Brady Statistic RV Percent Paced: 22.96 %
Date Time Interrogation Session: 20201130043824
HighPow Impedance: 75 Ohm
Implantable Lead Implant Date: 20130110
Implantable Lead Implant Date: 20130110
Implantable Lead Location: 753859
Implantable Lead Location: 753860
Implantable Lead Model: 5076
Implantable Lead Model: 6935
Implantable Pulse Generator Implant Date: 20181010
Lead Channel Impedance Value: 304 Ohm
Lead Channel Impedance Value: 361 Ohm
Lead Channel Impedance Value: 399 Ohm
Lead Channel Pacing Threshold Amplitude: 0.75 V
Lead Channel Pacing Threshold Pulse Width: 0.4 ms
Lead Channel Sensing Intrinsic Amplitude: 3.25 mV
Lead Channel Sensing Intrinsic Amplitude: 3.25 mV
Lead Channel Sensing Intrinsic Amplitude: 4.25 mV
Lead Channel Sensing Intrinsic Amplitude: 4.25 mV
Lead Channel Setting Pacing Amplitude: 2.5 V
Lead Channel Setting Pacing Pulse Width: 0.4 ms
Lead Channel Setting Sensing Sensitivity: 0.3 mV

## 2019-06-26 ENCOUNTER — Ambulatory Visit (INDEPENDENT_AMBULATORY_CARE_PROVIDER_SITE_OTHER): Payer: Medicare Other

## 2019-06-26 DIAGNOSIS — Z95810 Presence of automatic (implantable) cardiac defibrillator: Secondary | ICD-10-CM | POA: Diagnosis not present

## 2019-06-26 DIAGNOSIS — I5022 Chronic systolic (congestive) heart failure: Secondary | ICD-10-CM | POA: Diagnosis not present

## 2019-06-28 ENCOUNTER — Ambulatory Visit (INDEPENDENT_AMBULATORY_CARE_PROVIDER_SITE_OTHER): Payer: Medicare Other

## 2019-06-28 DIAGNOSIS — Z5181 Encounter for therapeutic drug level monitoring: Secondary | ICD-10-CM

## 2019-06-28 DIAGNOSIS — I4819 Other persistent atrial fibrillation: Secondary | ICD-10-CM

## 2019-06-28 LAB — POCT INR: INR: 2.7 (ref 2.0–3.0)

## 2019-06-28 NOTE — Patient Instructions (Signed)
Description   Spoke with pt and advised to have pt take 1 tablet today, then resume same dosage 1 tablet daily except for 1/2 tablet on Sundays and Thursdays. Recheck INR in 2 weeks. Self-tester.

## 2019-06-29 NOTE — Progress Notes (Signed)
EPIC Encounter for ICM Monitoring  Patient Name: Austin [name] is a 78 y.o. male Date: 06/29/2019 Primary Care Physican: Lajean Manes, MD Primary Cardiologist:Smith Electrophysiologist: Lovena Le 06/29/2019 Weight: 206 lbs  Spoke with patient and is asymptomatic for fluid accumulation.   OptivolThoracic impedancenormal.  Prescribed:Furosemide 20 mgTake 2 tablets (40 mg total) by mouth daily. May take an extra tablet as needed for weight gain.  Labs: 01/01/2019 Creatinine 1.38, BUN 27, Potassium 4.6, Sodium 137, GFR 49-57 12/20/2018 Creatinine 1.47, BUN 26, Potassium 4.5, Sodium 137, GFR 46-53 10/13/2018 Creatinine 1.02, BUN 18, Potassium 3.9, Sodium 139, GFR >60  Recommendations: Encouraged to call if experiencing fluid symptoms.  Follow-up plan: ICM clinic phone appointment on 08/06/2019.   91 day device clinic remote transmission 09/24/2019.     Copy of ICM check sent to Dr. Lovena Le.   3 month ICM trend: 06/25/2019    1 Year ICM trend:       [name], RN 06/29/2019 9:15 AM

## 2019-07-12 ENCOUNTER — Ambulatory Visit (INDEPENDENT_AMBULATORY_CARE_PROVIDER_SITE_OTHER): Payer: Medicare Other | Admitting: Cardiovascular Disease

## 2019-07-12 DIAGNOSIS — Z5181 Encounter for therapeutic drug level monitoring: Secondary | ICD-10-CM | POA: Diagnosis not present

## 2019-07-12 LAB — POCT INR: INR: 3.8 — AB (ref 2.0–3.0)

## 2019-07-12 NOTE — Patient Instructions (Signed)
Description   Spoke with pt and advised to have pt take 1 tablet today, then resume same dosage 1 tablet daily except for 1/2 tablet on Sundays and Thursdays. Recheck INR in 2 weeks. Self-tester.

## 2019-07-18 NOTE — Progress Notes (Signed)
ICD remote 

## 2019-07-26 ENCOUNTER — Ambulatory Visit (INDEPENDENT_AMBULATORY_CARE_PROVIDER_SITE_OTHER): Payer: Medicare Other

## 2019-07-26 DIAGNOSIS — I4819 Other persistent atrial fibrillation: Secondary | ICD-10-CM

## 2019-07-26 DIAGNOSIS — Z5181 Encounter for therapeutic drug level monitoring: Secondary | ICD-10-CM | POA: Diagnosis not present

## 2019-07-26 LAB — POCT INR: INR: 3.3 — AB (ref 2.0–3.0)

## 2019-07-26 NOTE — Patient Instructions (Signed)
Description   Spoke with pt and advised to continue on same dosage 1 tablet daily except for 1/2 tablet on Sundays and Thursdays. Recheck INR in 2 weeks. Self-tester.

## 2019-08-09 ENCOUNTER — Ambulatory Visit (INDEPENDENT_AMBULATORY_CARE_PROVIDER_SITE_OTHER): Payer: Medicare Other | Admitting: Interventional Cardiology

## 2019-08-09 DIAGNOSIS — Z5181 Encounter for therapeutic drug level monitoring: Secondary | ICD-10-CM | POA: Diagnosis not present

## 2019-08-09 LAB — POCT INR: INR: 2.8 (ref 2.0–3.0)

## 2019-08-09 NOTE — Patient Instructions (Signed)
Description   Spoke with pt and advised to take 1 tablet today and then continue on same dosage 1 tablet daily except for 1/2 tablet on Sundays and Thursdays. Recheck INR in 2 weeks. Self-tester.

## 2019-08-14 ENCOUNTER — Ambulatory Visit: Payer: Medicare Other | Attending: Internal Medicine

## 2019-08-14 DIAGNOSIS — Z23 Encounter for immunization: Secondary | ICD-10-CM | POA: Insufficient documentation

## 2019-08-14 NOTE — Progress Notes (Signed)
   Covid-19 Vaccination Clinic  Name:  Austin [name]    MRN: [id number] DOB: May 04, 1941  08/14/2019  [name] was observed post Covid-19 immunization for 15 minutes without incidence. He was provided with Vaccine Information Sheet and instruction to access the V-Safe system.   [name] was instructed to call 911 with any severe reactions post vaccine: Marland Kitchen Difficulty breathing  . Swelling of your face and throat  . A fast heartbeat  . A bad rash all over your body  . Dizziness and weakness    Immunizations Administered    Name Date Dose VIS Date Route   Pfizer COVID-19 Vaccine 08/14/2019 11:17 AM 0.3 mL 07/06/2019 Intramuscular   Manufacturer: [employer]   Lot: [id number]   [place]: [id number]

## 2019-08-15 ENCOUNTER — Other Ambulatory Visit: Payer: Self-pay | Admitting: Internal Medicine

## 2019-08-23 ENCOUNTER — Ambulatory Visit (INDEPENDENT_AMBULATORY_CARE_PROVIDER_SITE_OTHER): Payer: Medicare Other | Admitting: Internal Medicine

## 2019-08-23 DIAGNOSIS — Z5181 Encounter for therapeutic drug level monitoring: Secondary | ICD-10-CM | POA: Diagnosis not present

## 2019-08-23 LAB — POCT INR: INR: 3.4 — AB (ref 2.0–3.0)

## 2019-08-23 NOTE — Patient Instructions (Signed)
Description   Spoke with pt and advised to continue on same dosage 1 tablet daily except for 1/2 tablet on Sundays and Thursdays. Recheck INR in 2 weeks. Self-tester.

## 2019-08-27 ENCOUNTER — Other Ambulatory Visit: Payer: Self-pay | Admitting: Cardiology

## 2019-09-03 ENCOUNTER — Ambulatory Visit (INDEPENDENT_AMBULATORY_CARE_PROVIDER_SITE_OTHER): Payer: Medicare Other

## 2019-09-03 ENCOUNTER — Ambulatory Visit: Payer: Medicare Other | Attending: Internal Medicine

## 2019-09-03 DIAGNOSIS — Z95810 Presence of automatic (implantable) cardiac defibrillator: Secondary | ICD-10-CM | POA: Diagnosis not present

## 2019-09-03 DIAGNOSIS — I5022 Chronic systolic (congestive) heart failure: Secondary | ICD-10-CM

## 2019-09-03 DIAGNOSIS — Z23 Encounter for immunization: Secondary | ICD-10-CM | POA: Insufficient documentation

## 2019-09-03 NOTE — Progress Notes (Signed)
   Covid-19 Vaccination Clinic  Name:  LUE MAYEAUX    MRN: [id number] DOB: 10/25/1940  09/03/2019  Mr. Fringer was observed post Covid-19 immunization for 15 minutes without incidence. He was provided with Vaccine Information Sheet and instruction to access the V-Safe system.   Mr. Hemani was instructed to call 911 with any severe reactions post vaccine: Marland Kitchen Difficulty breathing  . Swelling of your face and throat  . A fast heartbeat  . A bad rash all over your body  . Dizziness and weakness    Immunizations Administered    Name Date Dose VIS Date Route   Pfizer COVID-19 Vaccine 09/03/2019 11:25 AM 0.3 mL 07/06/2019 Intramuscular   Manufacturer: [place]   Lot: [id number]   [place]: [id number]

## 2019-09-05 NOTE — Progress Notes (Signed)
EPIC Encounter for ICM Monitoring  Patient Name: Austin [name] is a 79 y.o. male Date: 09/05/2019 Primary Care Physican: Lajean Manes, MD Primary Cardiologist:Smith Electrophysiologist: Lovena Le Last Weight: 206lbs  Spoke with wife and patient is asymptomatic for fluid accumulation.   OptivolThoracic impedancenormal.  Prescribed:Furosemide 20 mgTake 2 tablets(40 mg total)by mouth daily. May take an extra tablet as needed for weight gain.  Labs: 01/01/2019 Creatinine 1.38, BUN 27, Potassium 4.6, Sodium 137, GFR 49-57 12/20/2018 Creatinine 1.47, BUN 26, Potassium 4.5, Sodium 137, GFR 46-53 10/13/2018 Creatinine 1.02, BUN 18, Potassium 3.9, Sodium 139, GFR >60  Recommendations: Encouraged to call if experiencing fluid symptoms.  Follow-up plan: ICM clinic phone appointment on 10/15/2019.   91 day device clinic remote transmission 09/24/2019.   Office appt 09/07/2019 with Dr. Lovena Le.    Copy of ICM check sent to Dr. Lovena Le.   3 month ICM trend: 09/03/2019    1 Year ICM trend:       [name], RN 09/05/2019 3:42 PM

## 2019-09-06 LAB — POCT INR: INR: 3.3 — AB (ref 2.0–3.0)

## 2019-09-07 ENCOUNTER — Other Ambulatory Visit: Payer: Self-pay

## 2019-09-07 ENCOUNTER — Ambulatory Visit: Payer: Medicare Other | Admitting: Internal Medicine

## 2019-09-07 ENCOUNTER — Encounter: Payer: Self-pay | Admitting: Internal Medicine

## 2019-09-07 ENCOUNTER — Ambulatory Visit (INDEPENDENT_AMBULATORY_CARE_PROVIDER_SITE_OTHER): Payer: Medicare Other

## 2019-09-07 VITALS — BP 118/64 | HR 57 | Ht 73.0 in | Wt 207.2 lb

## 2019-09-07 DIAGNOSIS — Z5181 Encounter for therapeutic drug level monitoring: Secondary | ICD-10-CM | POA: Diagnosis not present

## 2019-09-07 DIAGNOSIS — I472 Ventricular tachycardia, unspecified: Secondary | ICD-10-CM

## 2019-09-07 DIAGNOSIS — I4819 Other persistent atrial fibrillation: Secondary | ICD-10-CM

## 2019-09-07 DIAGNOSIS — I5022 Chronic systolic (congestive) heart failure: Secondary | ICD-10-CM

## 2019-09-07 DIAGNOSIS — Z95810 Presence of automatic (implantable) cardiac defibrillator: Secondary | ICD-10-CM | POA: Diagnosis not present

## 2019-09-07 LAB — CUP PACEART INCLINIC DEVICE CHECK
Battery Remaining Longevity: 104 mo
Battery Voltage: 3 V
Brady Statistic AP VP Percent: 0 %
Brady Statistic AP VS Percent: 0 %
Brady Statistic AS VP Percent: 14.64 %
Brady Statistic AS VS Percent: 85.36 %
Brady Statistic RA Percent Paced: 0 %
Brady Statistic RV Percent Paced: 20.64 %
Date Time Interrogation Session: 20210212163638
HighPow Impedance: 78 Ohm
Implantable Lead Implant Date: 20130110
Implantable Lead Implant Date: 20130110
Implantable Lead Location: 753859
Implantable Lead Location: 753860
Implantable Lead Model: 5076
Implantable Lead Model: 6935
Implantable Pulse Generator Implant Date: 20181010
Lead Channel Impedance Value: 342 Ohm
Lead Channel Impedance Value: 361 Ohm
Lead Channel Impedance Value: 399 Ohm
Lead Channel Pacing Threshold Amplitude: 0.75 V
Lead Channel Pacing Threshold Pulse Width: 0.4 ms
Lead Channel Sensing Intrinsic Amplitude: 4.75 mV
Lead Channel Setting Pacing Amplitude: 2.5 V
Lead Channel Setting Pacing Pulse Width: 0.4 ms
Lead Channel Setting Sensing Sensitivity: 0.3 mV

## 2019-09-07 MED ORDER — AMIODARONE HCL 200 MG PO TABS: ORAL_TABLET | ORAL | 3 refills | Status: DC

## 2019-09-07 MED ORDER — AMOXICILLIN 500 MG PO CAPS: ORAL_CAPSULE | ORAL | 2 refills | Status: DC

## 2019-09-07 NOTE — Progress Notes (Signed)
HPI [name] returns today for followup. He is a pleasant 79 yo man who has a h/o MVR, high grade heart block, atrial fib and VT s/p ablation. He did well for a couple of years but then redeveloped VT about a year ago after being off of amiodarone. We have gradually been able to get his VT under control. He has not had any ICD shocks. He is walking daily and working out.  No Known Allergies   Current Outpatient Medications  Medication Sig Dispense Refill  . acetaminophen (TYLENOL) 500 MG tablet Take 1,000 mg by mouth every 6 (six) hours as needed (pain).    Marland Kitchen amoxicillin (AMOXIL) 500 MG capsule TAKE 4 CAPSULES BY MOUTH 30 MINUTES PRIOR TO DENTAL WORK 4 capsule 2  . furosemide (LASIX) 20 MG tablet Take 2 tablets by mouth daily.  May take an extra tablet as needed for weight gain. 270 tablet 3  . levothyroxine (SYNTHROID) 50 MCG tablet TAKE 1 TABLET BY MOUTH DAILY BEFORE BREAKFAST 90 tablet 2  . lisinopril (PRINIVIL,ZESTRIL) 10 MG tablet Take 10 mg by mouth at bedtime.   9  . Multiple Vitamin (MULTIVITAMIN WITH MINERALS) TABS tablet Take 1 tablet by mouth daily.    . nitroGLYCERIN (NITROSTAT) 0.4 MG SL tablet Place 1 tablet (0.4 mg total) under the tongue every 5 (five) minutes x 3 doses as needed for chest pain. 25 tablet 2  . warfarin (COUMADIN) 5 MG tablet Take as directed by the coumadin clinic 90 tablet 0  . amiodarone (PACERONE) 200 MG tablet Take one tablet by mouth daily Monday through Friday.  Do NOT take on Saturday or Sunday. 90 tablet 3   No current facility-administered medications for this visit.     Past Medical History:  Diagnosis Date  . AICD (automatic cardioverter/defibrillator) present   . Atrial fibrillation ([place])   . 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 Conductive hearing loss   . Intraspinal abscess   . Mitral valve insufficiency and aortic valve insufficiency    s/p MVR  mechanical  . Nonischemic cardiomyopathy ([place])   . Psychosexual dysfunction with inhibited sexual excitement   . S/P mitral valve replacement   . Syncope and collapse   . Unspecified sleep apnea    last sleep study 11/07  . Ventricular tachycardia ([place])   . VT (ventricular tachycardia) ([facility])     ROS:   All systems reviewed and negative except as noted in the HPI.   Past Surgical History:  Procedure Laterality Date  . BIV ICD GENERATOR CHANGEOUT N/A 05/04/2017   Procedure: BiV ICD Generator Changeout;  Surgeon: Evans Lance, MD;  Location: [facility];  Service: Cardiovascular;  Laterality: N/A;  . CARDIAC CATHETERIZATION  04/24/2002  . CARDIAC VALVE REPLACEMENT    . CARDIOVERSION  03/09/2012   Procedure: CARDIOVERSION;  Surgeon: Evans Lance, MD;  Location: [facility] [place];  Service: Cardiovascular;  Laterality: N/A;  . CARDIOVERSION N/A 11/22/2013   Procedure: CARDIOVERSION;  Surgeon: Sanda Klein, MD;  [facility]: [facility];  Service: Cardiovascular;  Laterality: N/A;  . dental implants    . ELECTROPHYSIOLOGIC STUDY N/A 09/15/2015   Procedure: V Tach Ablation;  Surgeon: Evans Lance, MD;  Location: [facility];  Service: Cardiovascular;  Laterality: N/A;  . Evacution of epidural lumbar epidural abscess  1999  . INSERT / REPLACE / REMOVE PACEMAKER  11/2008  .  MITRAL VALVE REPLACEMENT     w #[address] [facility]  . PERMANENT PACEMAKER INSERTION N/A 08/05/2011   Procedure: PERMANENT PACEMAKER INSERTION;  Surgeon: Evans Lance, MD;  Location: [facility];  Service: Cardiovascular;  Laterality: N/A;  . THYROIDECTOMY    . TONSILLECTOMY    . VALVE REPLACEMENT  2000     Family History  Problem Relation Age of Onset  . Heart disease Mother   . Heart failure Mother   . Heart disease Father   . Heart failure Father      Social History   Socioeconomic History  . Marital status: Married    Spouse name: Not on file  . Number of children: 1  . Years of education: 14   . Highest education level: Master's degree (e.g., MA, MS, MEng, MEd, MSW, MBA)  Occupational History  . Occupation: retired from Engineer, manufacturing systems  Tobacco Use  . Smoking status: Never Smoker  . Smokeless tobacco: Never Used  Substance and Sexual Activity  . Alcohol use: Yes    Alcohol/week: 0.0 standard drinks    Comment: occasionally  . Drug use: No  . Sexual activity: Not on file  Other Topics Concern  . Not on file  Social History Narrative   Lives with wife in a one story home.  Has one daughter.  Retired.  Education: Masters.   Social Determinants of Health   Financial Resource Strain:   . Difficulty of Paying Living Expenses: Not on file  Food Insecurity:   . Worried About Charity fundraiser in the Last Year: Not on file  . Ran Out of Food in the Last Year: Not on file  Transportation Needs:   . Lack of Transportation (Medical): Not on file  . Lack of Transportation (Non-Medical): Not on file  Physical Activity:   . Days of Exercise per Week: Not on file  . Minutes of Exercise per Session: Not on file  Stress:   . Feeling of Stress : Not on file  Social Connections:   . Frequency of Communication with Friends and Family: Not on file  . Frequency of Social Gatherings with Friends and Family: Not on file  . Attends Religious Services: Not on file  . Active Member of Clubs or Organizations: Not on file  . Attends Archivist Meetings: Not on file  . Marital Status: Not on file  Intimate Partner Violence:   . Fear of Current or Ex-Partner: Not on file  . Emotionally Abused: Not on file  . Physically Abused: Not on file  . Sexually Abused: Not on file     BP 118/64   Pulse (!) 57   Ht 6\' 1"  (1.854 m)   Wt 207 lb 3.2 oz (94 kg)   SpO2 94%   BMI 27.34 kg/m   Physical Exam:  Well appearing NAD HEENT: Unremarkable Neck:  No JVD, no thyromegally Lymphatics:  No adenopathy Back:  No CVA tenderness Lungs:  Clear HEART:  Regular rate rhythm, 1/6  systolic murmurs, no rubs, no clicks, mechanical S2.  Abd:  soft, positive bowel sounds, no organomegally, no rebound, no guarding Ext:  2 plus pulses, no edema, no cyanosis, no clubbing Skin:  No rashes no nodules Neuro:  CN II through XII intact, motor grossly intact  EKG - atrial fib with a controlled VR  DEVICE  Normal device function.  See PaceArt for details.   Assess/Plan: 1. Atrial fib - his VR is well controlled. He will  continue his  Current meds. 2. VT - his VT has been controlled. He will reduce his dose of amiodarone to 200 mg daily, Mon-Fri, none on Saturday and Sunday. 3. 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 He will continue his current meds.  4. ICD - his Medtronic ICD is working normally. We will recheck in several months.   Mikle Bosworth.D.

## 2019-09-07 NOTE — Patient Instructions (Signed)
Description   Spoke with pt and advised to continue on same dosage 1 tablet daily except for 1/2 tablet on Sundays and Thursdays. Recheck INR in 2 weeks. Self-tester.

## 2019-09-07 NOTE — Patient Instructions (Addendum)
Medication Instructions:  Your physician has recommended you make the following change in your medication:   1.  Reduce your amiodarone 200 mg-  Take one tablet by mouth daily Monday through Friday.  Do NOT take on Saturday or Sunday.  Labwork: None ordered.  Testing/Procedures: None ordered.  Follow-Up: Your physician wants you to follow-up in: 6 months with Dr. Lovena Le.   You will receive a reminder letter in the mail two months in advance. If you don't receive a letter, please call our office to schedule the follow-up appointment.  Remote monitoring is used to monitor your ICD from home. This monitoring reduces the number of office visits required to check your device to one time per year. It allows [place] to keep an eye on the functioning of your device to ensure it is working properly. You are scheduled for a device check from home on 09/24/2019. You may send your transmission at any time that day. If you have a wireless device, the transmission will be sent automatically. After your physician reviews your transmission, you will receive a postcard with your next transmission date.  Any Other Special Instructions Will Be Listed Below (If Applicable).  If you need a refill on your cardiac medications before your next appointment, please call your pharmacy.

## 2019-09-20 ENCOUNTER — Ambulatory Visit (INDEPENDENT_AMBULATORY_CARE_PROVIDER_SITE_OTHER): Payer: Medicare Other | Admitting: *Deleted

## 2019-09-20 DIAGNOSIS — I4819 Other persistent atrial fibrillation: Secondary | ICD-10-CM | POA: Diagnosis not present

## 2019-09-20 DIAGNOSIS — Z5181 Encounter for therapeutic drug level monitoring: Secondary | ICD-10-CM | POA: Diagnosis not present

## 2019-09-20 LAB — POCT INR: INR: 3.5 — AB (ref 2.0–3.0)

## 2019-09-20 NOTE — Patient Instructions (Signed)
Description   Spoke with pt and advised to continue on same dosage 1 tablet daily except for 1/2 tablet on Sundays and Thursdays. Recheck INR in 2 weeks. Self-tester.

## 2019-09-24 ENCOUNTER — Ambulatory Visit (INDEPENDENT_AMBULATORY_CARE_PROVIDER_SITE_OTHER): Payer: Medicare Other | Admitting: *Deleted

## 2019-09-24 DIAGNOSIS — I5022 Chronic systolic (congestive) heart failure: Secondary | ICD-10-CM | POA: Diagnosis not present

## 2019-09-24 LAB — CUP PACEART REMOTE DEVICE CHECK
Battery Remaining Longevity: 105 mo
Battery Voltage: 3 V
Brady Statistic AP VP Percent: 0 %
Brady Statistic AP VS Percent: 0 %
Brady Statistic AS VP Percent: 10.77 %
Brady Statistic AS VS Percent: 89.23 %
Brady Statistic RA Percent Paced: 0 %
Brady Statistic RV Percent Paced: 16.85 %
Date Time Interrogation Session: 20210301022603
HighPow Impedance: 78 Ohm
Implantable Lead Implant Date: 20130110
Implantable Lead Implant Date: 20130110
Implantable Lead Location: 753859
Implantable Lead Location: 753860
Implantable Lead Model: 5076
Implantable Lead Model: 6935
Implantable Pulse Generator Implant Date: 20181010
Lead Channel Impedance Value: 342 Ohm
Lead Channel Impedance Value: 399 Ohm
Lead Channel Impedance Value: 399 Ohm
Lead Channel Pacing Threshold Amplitude: 0.875 V
Lead Channel Pacing Threshold Pulse Width: 0.4 ms
Lead Channel Sensing Intrinsic Amplitude: 3.25 mV
Lead Channel Sensing Intrinsic Amplitude: 3.25 mV
Lead Channel Sensing Intrinsic Amplitude: 3.75 mV
Lead Channel Sensing Intrinsic Amplitude: 3.75 mV
Lead Channel Setting Pacing Amplitude: 2.5 V
Lead Channel Setting Pacing Pulse Width: 0.4 ms
Lead Channel Setting Sensing Sensitivity: 0.3 mV

## 2019-09-25 NOTE — Progress Notes (Signed)
ICD Remote  

## 2019-10-04 ENCOUNTER — Ambulatory Visit (INDEPENDENT_AMBULATORY_CARE_PROVIDER_SITE_OTHER): Payer: Medicare Other | Admitting: Cardiovascular Disease

## 2019-10-04 DIAGNOSIS — Z5181 Encounter for therapeutic drug level monitoring: Secondary | ICD-10-CM | POA: Diagnosis not present

## 2019-10-04 LAB — POCT INR: INR: 3.9 — AB (ref 2.0–3.0)

## 2019-10-04 NOTE — Patient Instructions (Addendum)
Description   Spoke with pt and advised to hold today and then continue on same dosage 1 tablet daily except for 1/2 tablet on Sundays and Thursdays. Recheck INR in 2 weeks. Self-tester.

## 2019-10-15 ENCOUNTER — Ambulatory Visit (INDEPENDENT_AMBULATORY_CARE_PROVIDER_SITE_OTHER): Payer: Medicare Other

## 2019-10-15 DIAGNOSIS — Z95810 Presence of automatic (implantable) cardiac defibrillator: Secondary | ICD-10-CM

## 2019-10-15 DIAGNOSIS — I5022 Chronic systolic (congestive) heart failure: Secondary | ICD-10-CM | POA: Diagnosis not present

## 2019-10-19 ENCOUNTER — Telehealth: Payer: Self-pay | Admitting: *Deleted

## 2019-10-19 ENCOUNTER — Telehealth: Payer: Self-pay

## 2019-10-19 NOTE — Progress Notes (Signed)
EPIC Encounter for ICM Monitoring  Patient Name: Austin [name] is a 79 y.o. male Date: 10/19/2019 Primary Care Physican: Lajean Manes, MD Primary Cardiologist:Smith Electrophysiologist: Lovena Le 09/07/2019 Office Weight: 207lbs  Attempted call to patient and unable to reach.  Transmission reviewed.   OptivolThoracic impedancenormal.  Prescribed:Furosemide 20 mgTake 2 tablets(40 mg total)by mouth daily. May take an extra tablet as needed for weight gain.  Labs: 01/01/2019 Creatinine 1.38, BUN 27, Potassium 4.6, Sodium 137, GFR 49-57 12/20/2018 Creatinine 1.47, BUN 26, Potassium 4.5, Sodium 137, GFR 46-53 10/13/2018 Creatinine 1.02, BUN 18, Potassium 3.9, Sodium 139, GFR >60  Recommendations:Unable to reach.    Follow-up plan: ICM clinic phone appointment on4/26/2021. 91 day device clinic remote transmission 12/26/2019.    Copy of ICM check sent to Dr. Lovena Le.   3 month ICM trend: 10/15/2019    1 Year ICM trend:       [name], RN 10/19/2019 1:49 PM

## 2019-10-19 NOTE — Telephone Encounter (Signed)
Called patient since he is overdue for his PT/INR. He was due for INR check on yesterday. The phone message states the call did not go through and to try the call later and then the phone hangs up. Called three different times and the same message each time. Will follow up later.

## 2019-10-19 NOTE — Telephone Encounter (Signed)
Remote ICM transmission received.  Attempted call to patient regarding ICM remote transmission and call did not go through or ring.

## 2019-10-22 NOTE — Telephone Encounter (Signed)
Pt called back. Informed him that he did not get an INR last week from him. Pt stated that he forgot to check his INR but will check it tomorrow. Put pt's name down in the coumadin clinic follow up book.

## 2019-10-23 ENCOUNTER — Ambulatory Visit (INDEPENDENT_AMBULATORY_CARE_PROVIDER_SITE_OTHER): Payer: Medicare Other | Admitting: Cardiology

## 2019-10-23 DIAGNOSIS — Z5181 Encounter for therapeutic drug level monitoring: Secondary | ICD-10-CM

## 2019-10-23 DIAGNOSIS — I4819 Other persistent atrial fibrillation: Secondary | ICD-10-CM | POA: Diagnosis not present

## 2019-10-23 LAB — POCT INR: INR: 2.7 (ref 2.0–3.0)

## 2019-10-23 NOTE — Patient Instructions (Signed)
Description   Spoke with pt and advised to take 1.5 tablets today, then continue on same dosage 1 tablet daily except for 1/2 tablet on Sundays and Thursdays. Recheck INR in 2 weeks. Self-tester.

## 2019-11-01 ENCOUNTER — Ambulatory Visit (INDEPENDENT_AMBULATORY_CARE_PROVIDER_SITE_OTHER): Payer: Medicare Other | Admitting: Interventional Cardiology

## 2019-11-01 DIAGNOSIS — Z5181 Encounter for therapeutic drug level monitoring: Secondary | ICD-10-CM | POA: Diagnosis not present

## 2019-11-01 DIAGNOSIS — I4819 Other persistent atrial fibrillation: Secondary | ICD-10-CM | POA: Diagnosis not present

## 2019-11-01 LAB — POCT INR: INR: 3.5 — AB (ref 2.0–3.0)

## 2019-11-01 NOTE — Patient Instructions (Signed)
Description   Spoke with pt and advised to continue on same dosage 1 tablet daily except for 1/2 tablet on Sundays and Thursdays. Recheck INR in 2 weeks. Self-tester.

## 2019-11-15 ENCOUNTER — Telehealth: Payer: Self-pay | Admitting: Interventional Cardiology

## 2019-11-15 ENCOUNTER — Ambulatory Visit (INDEPENDENT_AMBULATORY_CARE_PROVIDER_SITE_OTHER): Payer: Medicare Other

## 2019-11-15 DIAGNOSIS — I4819 Other persistent atrial fibrillation: Secondary | ICD-10-CM

## 2019-11-15 DIAGNOSIS — Z5181 Encounter for therapeutic drug level monitoring: Secondary | ICD-10-CM

## 2019-11-15 LAB — POCT INR: INR: 4.8 — AB (ref 2.0–3.0)

## 2019-11-15 NOTE — Telephone Encounter (Signed)
Seen anti-coag encounter from today for further documentation.

## 2019-11-15 NOTE — Telephone Encounter (Signed)
° °  April from [facility] remote INR calling, she said pt was tested today 11/15/19 and has INR level of 4.8 results. She said she will fax copy of the result to Dr. Thompson Caul office   Please advise

## 2019-11-19 ENCOUNTER — Ambulatory Visit (INDEPENDENT_AMBULATORY_CARE_PROVIDER_SITE_OTHER): Payer: Medicare Other

## 2019-11-19 DIAGNOSIS — I5022 Chronic systolic (congestive) heart failure: Secondary | ICD-10-CM

## 2019-11-19 DIAGNOSIS — Z95810 Presence of automatic (implantable) cardiac defibrillator: Secondary | ICD-10-CM

## 2019-11-21 ENCOUNTER — Telehealth: Payer: Self-pay

## 2019-11-21 NOTE — Progress Notes (Signed)
EPIC Encounter for ICM Monitoring  Patient Name: Austin [name] is a 79 y.o. male Date: 11/21/2019 Primary Care Physican: Lajean Manes, MD Primary Cardiologist:Smith Electrophysiologist: Lovena Le 09/07/2019 Office Weight: 207lbs  Attempted call to patient and unable to reach.  Transmission reviewed.   OptivolThoracic impedancenormal.  Prescribed:Furosemide 20 mgTake 2 tablets(40 mg total)by mouth daily. May take an extra tablet as needed for weight gain.  Labs: 01/01/2019 Creatinine 1.38, BUN 27, Potassium 4.6, Sodium 137, GFR 49-57 12/20/2018 Creatinine 1.47, BUN 26, Potassium 4.5, Sodium 137, GFR 46-53 10/13/2018 Creatinine 1.02, BUN 18, Potassium 3.9, Sodium 139, GFR >60  Recommendations:Unable to reach.    Follow-up plan: ICM clinic phone appointment on6/09/2019. 91 day device clinic remote transmission 12/26/2019.  Copy of ICM check sent to Dr.Taylor.   3 month ICM trend: 11/19/2019    1 Year ICM trend:       [name], RN 11/21/2019 1:11 PM

## 2019-11-21 NOTE — Telephone Encounter (Signed)
Remote ICM transmission received.  Attempted call to patient regarding ICM remote transmission and left detailed message per DPR.  Advised to return call for any fluid symptoms or questions. Next ICM remote transmission scheduled 12/27/2019.

## 2019-11-22 ENCOUNTER — Ambulatory Visit (INDEPENDENT_AMBULATORY_CARE_PROVIDER_SITE_OTHER): Payer: Medicare Other | Admitting: Cardiology

## 2019-11-22 DIAGNOSIS — Z5181 Encounter for therapeutic drug level monitoring: Secondary | ICD-10-CM

## 2019-11-22 LAB — POCT INR: INR: 3.1 — AB (ref 2.0–3.0)

## 2019-11-22 NOTE — Patient Instructions (Signed)
Description   Spoke with pt and advised him to continue on same dosage 1 tablet daily except for 1/2 tablet on Sundays and Thursdays. Recheck INR in 2 weeks. Self-tester.

## 2019-11-28 ENCOUNTER — Other Ambulatory Visit: Payer: Self-pay | Admitting: Internal Medicine

## 2019-12-06 ENCOUNTER — Ambulatory Visit (INDEPENDENT_AMBULATORY_CARE_PROVIDER_SITE_OTHER): Payer: Medicare Other | Admitting: *Deleted

## 2019-12-06 DIAGNOSIS — Z5181 Encounter for therapeutic drug level monitoring: Secondary | ICD-10-CM | POA: Diagnosis not present

## 2019-12-06 DIAGNOSIS — I4819 Other persistent atrial fibrillation: Secondary | ICD-10-CM

## 2019-12-06 LAB — POCT INR: INR: 3.7 — AB (ref 2.0–3.0)

## 2019-12-06 NOTE — Patient Instructions (Signed)
Description   Spoke with pt and advised him to continue on same dosage 1 tablet daily except for 1/2 tablet on Sundays and Thursdays. Pt stated that he would be eating an extra serving of greens today.  Recheck INR in 2 weeks. Self-tester.

## 2019-12-20 ENCOUNTER — Ambulatory Visit (INDEPENDENT_AMBULATORY_CARE_PROVIDER_SITE_OTHER): Payer: Medicare Other

## 2019-12-20 DIAGNOSIS — I4819 Other persistent atrial fibrillation: Secondary | ICD-10-CM | POA: Diagnosis not present

## 2019-12-20 DIAGNOSIS — Z5181 Encounter for therapeutic drug level monitoring: Secondary | ICD-10-CM

## 2019-12-20 LAB — POCT INR: INR: 3.9 — AB (ref 2.0–3.0)

## 2019-12-20 NOTE — Patient Instructions (Signed)
Description   Spoke with pt and advised pt to take 1/2 tablet today and tomorrow, then resume same dosage 1 tablet daily except for 1/2 tablet on Sundays and Thursdays. Recheck INR in 2 weeks. Self-tester.

## 2019-12-25 ENCOUNTER — Ambulatory Visit: Payer: Medicare Other | Admitting: Sports Medicine

## 2019-12-25 ENCOUNTER — Encounter: Payer: Self-pay | Admitting: Sports Medicine

## 2019-12-25 ENCOUNTER — Other Ambulatory Visit: Payer: Self-pay

## 2019-12-25 VITALS — Temp 97.3°F

## 2019-12-25 DIAGNOSIS — M79675 Pain in left toe(s): Secondary | ICD-10-CM | POA: Diagnosis not present

## 2019-12-25 DIAGNOSIS — L602 Onychogryphosis: Secondary | ICD-10-CM | POA: Diagnosis not present

## 2019-12-25 DIAGNOSIS — Z7901 Long term (current) use of anticoagulants: Secondary | ICD-10-CM | POA: Diagnosis not present

## 2019-12-25 NOTE — Patient Instructions (Signed)

## 2019-12-25 NOTE — Progress Notes (Signed)
Subjective: Austin [name] is a 79 y.o. male patient presents to office today complaining of a severely thickened left great toenail that continues to snag on his socks and give him problems and reports that because the nail is so thick and discolored he wants it removed.  Patient reports that he did good when he had the toenail on the right foot removed before and reports that he never had to stop his warfarin and he healed fine without any problems or issues.  Patient denies fever/chills/nausea/vomitting/any other related constitutional symptoms at this time.  Review of systems noncontributory.  Patient Active Problem List   Diagnosis Date Noted  . Chronic anticoagulation 10/14/2018  . VT (ventricular tachycardia) ([place]) 10/12/2018  . Neuropathy 08/25/2018  . 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  . Nonischemic cardiomyopathy ([place])   . Postprocedural hypotension 01/10/2016  . Subtherapeutic international normalized ratio (INR) 01/10/2016  . S/P ablation of ventricular arrhythmia 01/10/2016  . Hypothyroidism 10/27/2015  . H/O mitral valve replacement with mechanical valve   . Encounter for therapeutic drug monitoring 08/20/2013  . Cardiac device, implant, or graft infection or inflammation ([place]) 06/28/2011  . 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 Chronic systolic heart failure ([place]) 04/15/2009  . ERECTILE DYSFUNCTION 11/27/2008  . ABSCESS, SPINE, EPIDURAL 11/27/2008  . HEARING LOSS 11/27/2008  . Atrial fibrillation ([place]) 11/27/2008  . SYNCOPE 11/27/2008  . SLEEP APNEA 11/27/2008    Current Outpatient Medications on File Prior to Visit  Medication Sig Dispense Refill  . acetaminophen (TYLENOL) 500 MG tablet Take 1,000 mg by mouth every 6 (six) hours as needed (pain).    Marland Kitchen amiodarone (PACERONE) 200 MG tablet Take one tablet by mouth daily Monday through Friday.  Do NOT take on Saturday or Sunday. 90 tablet 3  . amoxicillin (AMOXIL) 500 MG capsule TAKE 4  CAPSULES BY MOUTH 30 MINUTES PRIOR TO DENTAL WORK 4 capsule 2  . furosemide (LASIX) 20 MG tablet Take 2 tablets by mouth daily.  May take an extra tablet as needed for weight gain. 270 tablet 3  . levothyroxine (SYNTHROID) 50 MCG tablet TAKE 1 TABLET BY MOUTH DAILY BEFORE BREAKFAST 90 tablet 2  . Multiple Vitamin (MULTIVITAMIN WITH MINERALS) TABS tablet Take 1 tablet by mouth daily.    . nitroGLYCERIN (NITROSTAT) 0.4 MG SL tablet Place 1 tablet (0.4 mg total) under the tongue every 5 (five) minutes x 3 doses as needed for chest pain. 25 tablet 2  . warfarin (COUMADIN) 5 MG tablet TAKE AS DIRECTED BY THE COUMADIN CLINIC 90 tablet 0   No current facility-administered medications on file prior to visit.    Allergies  Allergen Reactions  . Zetia [Ezetimibe]     Pt stated, "Upset my stomach"    Objective:  There were no vitals filed for this visit.  General: Well developed, nourished, in no acute distress, alert and oriented x3   Dermatology: Skin is warm, dry and supple bilateral.  Left hallux nail bed severely thickened overgrown with rams horn's appearance. (-) Erythema. (-) Edema. (-) serosanguous drainage present. The remaining nails appear unremarkable at this time. There are no open sores, lesions or other signs of infection present.  Vascular: Dorsalis Pedis artery and Posterior Tibial artery pedal pulses are 1/4 bilateral with immedate capillary fill time.  Mild pedal hair growth present. No lower extremity edema.   Neruologic: Grossly intact via light touch bilateral.  Musculoskeletal: Tenderness to palpation of the left hallux nail diffusely.  Muscular strength within normal limits  in all groups bilateral.   Assesement and Plan: Problem List Items Addressed This Visit    None    Visit Diagnoses    Onychogryphosis    -  Primary   Toe pain, left       Long term current use of anticoagulant          -Discussed treatment alternatives and plan of care; Explained  permanent/temporary nail avulsion and post procedure course to patient. Patient elects for total permanent removal of left hallux nail. - After a verbal and written consent, injected 70ml of a 50:50 mixture of 2% plain  lidocaine and 0.5% plain marcaine in a normal hallux block fashion. Next, a  betadine prep was performed. Anesthesia was tested and found to be appropriate.  The offending left hallux nail was completely  incised from the hyponychium to the epinychium. The offending nail totally was removed and cleared from the field. The area was curretted for any remaining nail or spicules. Phenol application performed and the area was then flushed with alcohol and dressed with antibiotic cream and a dry sterile dressing. -Patient was instructed to leave the dressing intact for today and begin soaking  in a weak solution of betadine or Epsom salt and water tomorrow. Patient was instructed to soak for 15-20 minutes each day and apply neosporin/corticosporin and a gauze or bandaid dressing each day. -Patient was instructed to monitor the toe for signs of infection and return to office if toe becomes red, hot or swollen. -Advised patient since he is on a blood thinner may have issues with bleeding if bleeding persist to call office but to be sure to use Coban compression dressing if needed to also help with keeping bleeding under control after nail removal since he is on blood thinner -Advised ice, elevation, and tylenol or motrin if needed for pain.  -Patient is to return in 2 weeks for follow up care/nail check or sooner if problems arise.  Landis Martins, DPM

## 2019-12-26 ENCOUNTER — Ambulatory Visit (INDEPENDENT_AMBULATORY_CARE_PROVIDER_SITE_OTHER): Payer: Medicare Other | Admitting: *Deleted

## 2019-12-26 DIAGNOSIS — I472 Ventricular tachycardia, unspecified: Secondary | ICD-10-CM

## 2019-12-27 LAB — CUP PACEART REMOTE DEVICE CHECK
Battery Remaining Longevity: 101 mo
Battery Voltage: 2.96 V
Brady Statistic AP VP Percent: 0 %
Brady Statistic AP VS Percent: 0 %
Brady Statistic AS VP Percent: 20.99 %
Brady Statistic AS VS Percent: 79.01 %
Brady Statistic RA Percent Paced: 0 %
Brady Statistic RV Percent Paced: 22.42 %
Date Time Interrogation Session: 20210603012308
HighPow Impedance: 73 Ohm
Implantable Lead Implant Date: 20130110
Implantable Lead Implant Date: 20130110
Implantable Lead Location: 753859
Implantable Lead Location: 753860
Implantable Lead Model: 5076
Implantable Lead Model: 6935
Implantable Pulse Generator Implant Date: 20181010
Lead Channel Impedance Value: 361 Ohm
Lead Channel Impedance Value: 399 Ohm
Lead Channel Impedance Value: 456 Ohm
Lead Channel Pacing Threshold Amplitude: 0.875 V
Lead Channel Pacing Threshold Pulse Width: 0.4 ms
Lead Channel Sensing Intrinsic Amplitude: 3.25 mV
Lead Channel Sensing Intrinsic Amplitude: 3.25 mV
Lead Channel Sensing Intrinsic Amplitude: 4.25 mV
Lead Channel Sensing Intrinsic Amplitude: 4.25 mV
Lead Channel Setting Pacing Amplitude: 2.5 V
Lead Channel Setting Pacing Pulse Width: 0.4 ms
Lead Channel Setting Sensing Sensitivity: 0.3 mV

## 2019-12-28 ENCOUNTER — Ambulatory Visit (INDEPENDENT_AMBULATORY_CARE_PROVIDER_SITE_OTHER): Payer: Medicare Other

## 2019-12-28 DIAGNOSIS — Z95810 Presence of automatic (implantable) cardiac defibrillator: Secondary | ICD-10-CM

## 2019-12-28 DIAGNOSIS — I5022 Chronic systolic (congestive) heart failure: Secondary | ICD-10-CM | POA: Diagnosis not present

## 2020-01-01 NOTE — Progress Notes (Signed)
EPIC Encounter for ICM Monitoring  Patient Name: Austin [name] is a 79 y.o. male Date: 01/01/2020 Primary Care Physican: Lajean Manes, MD Primary Cardiologist:Smith Electrophysiologist: Lovena Le 01/01/2020 OfficeWeight: 198-199lbs  Spoke with patient and reports feeling well at this time.  Denies fluid symptoms.    OptivolThoracic impedancenormal.  Prescribed:Furosemide 20 mgTake 2 tablets(40 mg total)by mouth daily. May take an extra tablet as needed for weight gain.  Labs: 01/01/2019 Creatinine 1.38, BUN 27, Potassium 4.6, Sodium 137, GFR 49-57 12/20/2018 Creatinine 1.47, BUN 26, Potassium 4.5, Sodium 137, GFR 46-53 10/13/2018 Creatinine 1.02, BUN 18, Potassium 3.9, Sodium 139, GFR >60  Recommendations:No changes and encouraged to call if experiencing any fluid symptoms.  Follow-up plan: ICM clinic phone appointment on7/06/2020. 91 day device clinic remote transmission9/07/2019. Office visit with Dr Lovena Le on 03/11/2020.  Copy of ICM check sent to Dr.Taylor.  3 month ICM trend: 12/27/2019    1 Year ICM trend:       [name], RN 01/01/2020 9:36 AM

## 2020-01-01 NOTE — Progress Notes (Signed)
Remote ICD transmission.   

## 2020-01-03 ENCOUNTER — Ambulatory Visit (INDEPENDENT_AMBULATORY_CARE_PROVIDER_SITE_OTHER): Payer: Medicare Other | Admitting: *Deleted

## 2020-01-03 DIAGNOSIS — I4891 Unspecified atrial fibrillation: Secondary | ICD-10-CM

## 2020-01-03 DIAGNOSIS — Z5181 Encounter for therapeutic drug level monitoring: Secondary | ICD-10-CM

## 2020-01-03 LAB — POCT INR: INR: 3.4 — AB (ref 2.0–3.0)

## 2020-01-03 NOTE — Patient Instructions (Signed)
Description   Spoke with pt and advised pt to continue taking Warfarin 1 tablet daily except for 1/2 tablet on Sundays and Thursdays. Recheck INR in 2 weeks. Self-tester.

## 2020-01-08 ENCOUNTER — Ambulatory Visit: Payer: Medicare Other | Admitting: Sports Medicine

## 2020-01-08 ENCOUNTER — Other Ambulatory Visit: Payer: Self-pay

## 2020-01-08 ENCOUNTER — Encounter: Payer: Self-pay | Admitting: Sports Medicine

## 2020-01-08 VITALS — Temp 97.3°F

## 2020-01-08 DIAGNOSIS — M79675 Pain in left toe(s): Secondary | ICD-10-CM

## 2020-01-08 DIAGNOSIS — Z9889 Other specified postprocedural states: Secondary | ICD-10-CM

## 2020-01-08 DIAGNOSIS — Z7901 Long term (current) use of anticoagulants: Secondary | ICD-10-CM

## 2020-01-08 NOTE — Progress Notes (Signed)
Subjective: Austin [name] is a 79 y.o. male patient returns to office today for follow up evaluation after having left hallux nail removed on 12/25/2019 in a complete PNA fashion, reports that its sore and not sure if its healing at the top, still soaking with no issues. No other pedal complaints.   Patient Active Problem List   Diagnosis Date Noted  . Chronic anticoagulation 10/14/2018  . VT (ventricular tachycardia) ([place]) 10/12/2018  . Neuropathy 08/25/2018  . 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  . Nonischemic cardiomyopathy ([place])   . Postprocedural hypotension 01/10/2016  . Subtherapeutic international normalized ratio (INR) 01/10/2016  . S/P ablation of ventricular arrhythmia 01/10/2016  . Hypothyroidism 10/27/2015  . H/O mitral valve replacement with mechanical valve   . Encounter for therapeutic drug monitoring 08/20/2013  . Cardiac device, implant, or graft infection or inflammation ([place]) 06/28/2011  . 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 Chronic systolic heart failure ([place]) 04/15/2009  . ERECTILE DYSFUNCTION 11/27/2008  . ABSCESS, SPINE, EPIDURAL 11/27/2008  . HEARING LOSS 11/27/2008  . Atrial fibrillation ([place]) 11/27/2008  . SYNCOPE 11/27/2008  . SLEEP APNEA 11/27/2008    Current Outpatient Medications on File Prior to Visit  Medication Sig Dispense Refill  . acetaminophen (TYLENOL) 500 MG tablet Take 1,000 mg by mouth every 6 (six) hours as needed (pain).    Marland Kitchen amiodarone (PACERONE) 200 MG tablet Take one tablet by mouth daily Monday through Friday.  Do NOT take on Saturday or Sunday. 90 tablet 3  . amoxicillin (AMOXIL) 500 MG capsule TAKE 4 CAPSULES BY MOUTH 30 MINUTES PRIOR TO DENTAL WORK 4 capsule 2  . furosemide (LASIX) 20 MG tablet Take 2 tablets by mouth daily.  May take an extra tablet as needed for weight gain. 270 tablet 3  . levothyroxine (SYNTHROID) 50 MCG tablet TAKE 1 TABLET BY MOUTH DAILY BEFORE BREAKFAST 90 tablet 2  .  Multiple Vitamin (MULTIVITAMIN WITH MINERALS) TABS tablet Take 1 tablet by mouth daily.    . nitroGLYCERIN (NITROSTAT) 0.4 MG SL tablet Place 1 tablet (0.4 mg total) under the tongue every 5 (five) minutes x 3 doses as needed for chest pain. 25 tablet 2  . warfarin (COUMADIN) 5 MG tablet TAKE AS DIRECTED BY THE COUMADIN CLINIC 90 tablet 0   No current facility-administered medications on file prior to visit.    Allergies  Allergen Reactions  . Zetia [Ezetimibe]     Pt stated, "Upset my stomach"    Objective:  General: Well developed, nourished, in no acute distress, alert and oriented x3   Dermatology: Skin is warm, dry and supple bilateral. Left hallux nail bed appears to be clean, dry, with mild granular tissue and pinpoint wound at distal tuft of the toe with fatty tissue exposed. (+) focal Erythema. (-) Edema. (+) bloody drainage present. The remaining nails appear unremarkable at this time. There are no other lesions or other signs of infection  present.  Neurovascular status: Intact. +venous skin changes.   Musculoskeletal: Mild tenderness to palpation of the Left hallux nail bed Muscular strength within normal limits bilateral.   Assesement and Plan: Problem List Items Addressed This Visit    None    Visit Diagnoses    Status post nail surgery    -  Primary   Toe pain, left       Long term current use of anticoagulant          -Examined patient  -Cleansed left hallux nail bed and tuft and  gently scrubbed with peroxide and applied betadinencovered with coban -Discussed plan of care with patient. -Patient to now begin soaking in a weak solution of Epsom salt and warm water. Patient was instructed to soak for 15-20 minutes each day until the toe appears normal and there is no drainage, redness, tenderness, or swelling at the procedure site, and apply betadine for at least 1 week and a gauze or bandaid dressing each day as needed. May leave open to air at night. -Educated  patient on long term care after nail surgery. -Advised patient that he will heal slower because of his venous disease -Patient was instructed to monitor the toe for reoccurrence and signs of infection; Patient advised to return to office or go to ER if toe becomes red, hot or swollen. -Patient is to return in 2 weeks or another nail check or sooner if problems arise.  Landis Martins, DPM

## 2020-01-10 NOTE — Progress Notes (Signed)
Cardiology Office Note:    Date:  01/14/2020   ID:  Austin [name], DOB Dec 30, 1940, MRN [id number]  PCP:  Austin Manes, MD  Cardiologist:  Austin Peru, MD   Referring MD: Austin Manes, MD   Chief Complaint  Patient presents with  . Atrial Fibrillation  . Cardiac Valve Problem    History of Present Illness:    Austin [name] is a 79 y.o. male with a hx of mechanical mitral valve, chronic anticoagulation therapy, nonischemic cardiomyopathy, biventricular implantable cardiac defibrillator, recurrent ventricular tachycardia, amiodarone for VT suppression, multiple medication regimen to suppress ventricular ectopy, and sleep apnea.  Austin [name] feels more short of breath than usual.  He denies orthopnea.  Now in chronic atrial fibrillation and is also had episodes of ventricular tachycardia that required therapy from the automatic internal cardiac defibrillator within the past 18 months.  He denies lower extremity swelling.  Some days are better than others.  No chest pain.  He has not had blood in the urine or stool.  Not currently on any heart failure therapy.  Last ejection fraction was 35 to 40% which was slightly improved compared to 3 years prior when the ejection fraction was 30 to 35%.  Recently lisinopril was discontinued.  Beta-blocker therapy has been discontinued some years ago.  He is on furosemide therapy for volume control.  He recalls that lisinopril was discontinued because of "kidney".  Past Medical History:  Diagnosis Date  . AICD (automatic cardioverter/defibrillator) present   . Atrial fibrillation ([place])   . 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 Conductive hearing loss   . Intraspinal abscess   . 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 Nonischemic cardiomyopathy ([place])   . Psychosexual dysfunction with inhibited sexual excitement   . S/P  mitral valve replacement   . Syncope and collapse   . Unspecified sleep apnea    last sleep study 11/07  . Ventricular tachycardia ([place])   . VT (ventricular tachycardia) ([place])     Past Surgical History:  Procedure Laterality Date  . BIV ICD GENERATOR CHANGEOUT N/A 05/04/2017   Procedure: BiV ICD Generator Changeout;  Surgeon: Evans Lance, MD;  Location: [facility];  Service: Cardiovascular;  Laterality: N/A;  . CARDIAC CATHETERIZATION  04/24/2002  . CARDIAC VALVE REPLACEMENT    . CARDIOVERSION  03/09/2012   Procedure: CARDIOVERSION;  Surgeon: Evans Lance, MD;  Location: [facility];  Service: Cardiovascular;  Laterality: N/A;  . CARDIOVERSION N/A 11/22/2013   Procedure: CARDIOVERSION;  Surgeon: Sanda Klein, MD;  Location: [facility];  Service: Cardiovascular;  Laterality: N/A;  . dental implants    . ELECTROPHYSIOLOGIC STUDY N/A 09/15/2015   Procedure: V Tach Ablation;  Surgeon: Evans Lance, MD;  Location: [facility];  Service: Cardiovascular;  Laterality: N/A;  . Evacution of epidural lumbar epidural abscess  1999  . INSERT / REPLACE / REMOVE PACEMAKER  11/2008  . MITRAL VALVE REPLACEMENT     w #[id number] [facility]  . PERMANENT PACEMAKER INSERTION N/A 08/05/2011   Procedure: PERMANENT PACEMAKER INSERTION;  Surgeon: Evans Lance, MD;  Location: [facility];  Service: Cardiovascular;  Laterality: N/A;  . THYROIDECTOMY    . TONSILLECTOMY    . VALVE REPLACEMENT  2000    Current Medications: Current Meds  Medication Sig  . acetaminophen (TYLENOL) 500 MG tablet Take 1,000  mg by mouth every 6 (six) hours as needed (pain).  Marland Kitchen amiodarone (PACERONE) 200 MG tablet Take one tablet by mouth daily Monday through Friday.  Do NOT take on Saturday or Sunday.  Marland Kitchen amoxicillin (AMOXIL) 500 MG capsule TAKE 4 CAPSULES BY MOUTH 30 MINUTES PRIOR TO DENTAL WORK  . furosemide (LASIX) 20 MG tablet Take 2 tablets by mouth daily.  May take an extra tablet as needed for weight gain.  Marland Kitchen  levothyroxine (SYNTHROID) 50 MCG tablet TAKE 1 TABLET BY MOUTH DAILY BEFORE BREAKFAST  . Multiple Vitamin (MULTIVITAMIN WITH MINERALS) TABS tablet Take 1 tablet by mouth daily.  . nitroGLYCERIN (NITROSTAT) 0.4 MG SL tablet Place 1 tablet (0.4 mg total) under the tongue every 5 (five) minutes x 3 doses as needed for chest pain.  Marland Kitchen warfarin (COUMADIN) 5 MG tablet TAKE AS DIRECTED BY THE COUMADIN CLINIC     Allergies:   Zetia [ezetimibe]   Social History   Socioeconomic History  . Marital status: Married    Spouse name: Not on file  . Number of children: 1  . Years of education: 72  . Highest education level: Master's degree (e.g., MA, MS, MEng, MEd, MSW, MBA)  Occupational History  . Occupation: retired from Engineer, manufacturing systems  Tobacco Use  . Smoking status: Never Smoker  . Smokeless tobacco: Never Used  Vaping Use  . Vaping Use: Never used  Substance and Sexual Activity  . Alcohol use: Yes    Alcohol/week: 0.0 standard drinks    Comment: occasionally  . Drug use: No  . Sexual activity: Not on file  Other Topics Concern  . Not on file  Social History Narrative   Lives with wife in a one story home.  Has one daughter.  Retired.  Education: Masters.   Social Determinants of Health   Financial Resource Strain:   . Difficulty of Paying Living Expenses:   Food Insecurity:   . Worried About Charity fundraiser in the Last Year:   . Arboriculturist in the Last Year:   Transportation Needs:   . Film/video editor (Medical):   Marland Kitchen Lack of Transportation (Non-Medical):   Physical Activity:   . Days of Exercise per Week:   . Minutes of Exercise per Session:   Stress:   . Feeling of Stress :   Social Connections:   . Frequency of Communication with Friends and Family:   . Frequency of Social Gatherings with Friends and Family:   . Attends Religious Services:   . Active Member of Clubs or Organizations:   . Attends Archivist Meetings:   Marland Kitchen Marital Status:       Family History: The patient's family history includes Heart disease in his father and mother; Heart failure in his father and mother.  ROS:   Please see the history of present illness.    Neuropathy and difficulty sleeping because of pain in his back and legs.  Decreased exertional tolerance.  All other systems reviewed and are negative.  EKGs/Labs/Other Studies Reviewed:    The following studies were reviewed today:  2D Doppler echocardiogram March 2020: IMPRESSIONS    1. The left ventricle has moderately reduced systolic function, with an  ejection fraction of 35-40%. The cavity size was normal. Left ventricular  diastolic function could not be evaluated due to nondiagnostic images.  2. The right ventricle has normal systolic function. The cavity was  normal. There is mildly increased right ventricular wall thickness.  3.  Left atrial size was severely dilated.  4. Right atrial size was moderately dilated.  5. Mild thickening of the mitral valve leaflet. Mild calcification of the  mitral valve leaflet. There is moderate mitral annular calcification  present.  6. The tricuspid valve is grossly normal.  7. The aortic valve is tricuspid Mild thickening of the aortic valve Mild  calcification of the aortic valve.  8. There is mild dilatation of the ascending aorta.  9. The interatrial septum was not assessed  EKG:  EKG February EKG during EP visit revealed right bundle, A. fib, heart rate 57 bpm.  Recent Labs: No results found for requested labs within last 8760 hours.  Recent Lipid Panel    Component Value Date/Time   CHOL 158 08/23/2015 0543   TRIG 130 08/23/2015 0543   HDL 50 08/23/2015 0543   CHOLHDL 3.2 08/23/2015 0543   VLDL 26 08/23/2015 0543   LDLCALC 82 08/23/2015 0543    Physical Exam:    VS:  Ht 6\' 1"  (1.854 m)   BMI 27.34 kg/m     Wt Readings from Last 3 Encounters:  09/07/19 207 lb 3.2 oz (94 kg)  05/02/19 206 lb (93.4 kg)  03/27/19 200 lb  (90.7 kg)     GEN: Healthy-appearing. No acute distress HEENT: Normal NECK: No JVD. LYMPHATICS: No lymphadenopathy CARDIAC: Irregularly irregular RR without murmur, gallop, or edema. VASCULAR:  Normal Pulses. No bruits. RESPIRATORY:  Clear to auscultation without rales, wheezing or rhonchi  ABDOMEN: Soft, non-tender, non-distended, No pulsatile mass, MUSCULOSKELETAL: No deformity  SKIN: Warm and dry NEUROLOGIC:  Alert and oriented x 3 PSYCHIATRIC:  Normal affect   ASSESSMENT:    1. Chronic systolic heart failure (HCC)   2. Persistent atrial fibrillation ([place])   3. VT (ventricular tachycardia) ([place])   4. Automatic implantable cardioverter-defibrillator in situ   5. Nonischemic cardiomyopathy ([place])   6. H/O mitral valve replacement with mechanical valve   7. Chronic anticoagulation   8. Educated about COVID-19 virus infection    PLAN:    In order of problems listed above:  1. Not currently on any therapy for heart failure.  Says he cannot tolerate beta-blockers (I will review his records to determine exactly what the issue was).  ACE inhibitor therapy was recently discontinued by Dr. Felipa Eth.  Not on SGLT2 or mineralocorticoid receptor antagonist therapy.  Check 2D Doppler echocardiogram in light of his current complaints of shortness of breath.  A BNP will be done along with CBC and basic metabolic panel. 2. Continue Eliquis 5 mg twice daily. 3. Continue amiodarone and AICD therapy 4. Most recently seen in February by Dr. Crissie Sickles. 5. Heart failure therapy as mentioned above well be reinstituted as tolerated depending upon EF. 6. A systolic murmur of mitral regurgitation/tricuspid regurgitation is audible.  No gross evidence of volume overload. 7. Continue Eliquis therapy.  Hemoglobin will be checked. 8. Vaccinated.  Still practicing social distancing.   Medication Adjustments/Labs and Tests Ordered: Current medicines are reviewed at length with the patient today.   Concerns regarding medicines are outlined above.  No orders of the defined types were placed in this encounter.  No orders of the defined types were placed in this encounter.   There are no Patient Instructions on file for this visit.   Signed, Sinclair Grooms, MD  01/14/2020 8:05 AM    [place]

## 2020-01-14 ENCOUNTER — Ambulatory Visit: Payer: Medicare Other | Admitting: Interventional Cardiology

## 2020-01-14 ENCOUNTER — Encounter: Payer: Self-pay | Admitting: Interventional Cardiology

## 2020-01-14 ENCOUNTER — Other Ambulatory Visit: Payer: Self-pay

## 2020-01-14 VITALS — BP 142/76 | HR 51 | Ht 73.0 in | Wt 204.8 lb

## 2020-01-14 DIAGNOSIS — I472 Ventricular tachycardia, unspecified: Secondary | ICD-10-CM

## 2020-01-14 DIAGNOSIS — Z7189 Other specified counseling: Secondary | ICD-10-CM

## 2020-01-14 DIAGNOSIS — Z95810 Presence of automatic (implantable) cardiac defibrillator: Secondary | ICD-10-CM | POA: Diagnosis not present

## 2020-01-14 DIAGNOSIS — I4819 Other persistent atrial fibrillation: Secondary | ICD-10-CM | POA: Diagnosis not present

## 2020-01-14 DIAGNOSIS — Z7901 Long term (current) use of anticoagulants: Secondary | ICD-10-CM

## 2020-01-14 DIAGNOSIS — Z952 Presence of prosthetic heart valve: Secondary | ICD-10-CM

## 2020-01-14 DIAGNOSIS — I5022 Chronic systolic (congestive) heart failure: Secondary | ICD-10-CM

## 2020-01-14 DIAGNOSIS — I428 Other cardiomyopathies: Secondary | ICD-10-CM

## 2020-01-14 NOTE — Patient Instructions (Signed)
Medication Instructions:  Your physician recommends that you continue on your current medications as directed. Please refer to the Current Medication list given to you today.  *If you need a refill on your cardiac medications before your next appointment, please call your pharmacy*   Lab Work: BMET, CBC and Pro BNP today  If you have labs (blood work) drawn today and your tests are completely normal, you will receive your results only by: Marland Kitchen MyChart Message (if you have MyChart) OR . A paper copy in the mail If you have any lab test that is abnormal or we need to change your treatment, we will call you to review the results.   Testing/Procedures: Your physician has requested that you have an echocardiogram. Echocardiography is a painless test that uses sound waves to create images of your heart. It provides your doctor with information about the size and shape of your heart and how well your heart's chambers and valves are working. This procedure takes approximately one hour. There are no restrictions for this procedure.   Follow-Up: At [facility], you and your health needs are our priority.  As part of our continuing mission to provide you with exceptional heart care, we have created designated Provider Care Teams.  These Care Teams include your primary Cardiologist (physician) and Advanced Practice Providers (APPs -  Physician Assistants and Nurse Practitioners) who all work together to provide you with the care you need, when you need it.  We recommend signing up for the patient portal called "MyChart".  Sign up information is provided on this After Visit Summary.  MyChart is used to connect with patients for Virtual Visits (Telemedicine).  Patients are able to view lab/test results, encounter notes, upcoming appointments, etc.  Non-urgent messages can be sent to your provider as well.   To learn more about what you can do with MyChart, go to [email or web address].    Your next  appointment:   6 month(s)  The format for your next appointment:   In Person  Provider:   You may see Dr. Daneen Schick or one of the following Advanced Practice Providers on your designated Care Team:    Truitt Merle, NP  Cecilie Kicks, NP  Kathyrn Drown, NP    Other Instructions

## 2020-01-15 LAB — CBC
Hematocrit: 45.1 % (ref 37.5–51.0)
Hemoglobin: 14.3 g/dL (ref 13.0–17.7)
MCH: 29.6 pg (ref 26.6–33.0)
MCHC: 31.7 g/dL (ref 31.5–35.7)
MCV: 93 fL (ref 79–97)
Platelets: 161 10*3/uL (ref 150–450)
RBC: 4.83 x10E6/uL (ref 4.14–5.80)
RDW: 13.1 % (ref 11.6–15.4)
WBC: 6.1 10*3/uL (ref 3.4–10.8)

## 2020-01-15 LAB — PRO B NATRIURETIC PEPTIDE: NT-Pro BNP: 1971 pg/mL — ABNORMAL HIGH (ref 0–486)

## 2020-01-15 LAB — BASIC METABOLIC PANEL
BUN/Creatinine Ratio: 16 (ref 10–24)
BUN: 19 mg/dL (ref 8–27)
CO2: 21 mmol/L (ref 20–29)
Calcium: 9.1 mg/dL (ref 8.6–10.2)
Chloride: 102 mmol/L (ref 96–106)
Creatinine, Ser: 1.17 mg/dL (ref 0.76–1.27)
GFR calc Af Amer: 69 mL/min/{1.73_m2} (ref >59)
GFR calc non Af Amer: 60 mL/min/{1.73_m2} (ref >59)
Glucose: 64 mg/dL — ABNORMAL LOW (ref 65–99)
Potassium: 4 mmol/L (ref 3.5–5.2)
Sodium: 140 mmol/L (ref 134–144)

## 2020-01-17 ENCOUNTER — Ambulatory Visit (INDEPENDENT_AMBULATORY_CARE_PROVIDER_SITE_OTHER): Payer: Medicare Other | Admitting: *Deleted

## 2020-01-17 DIAGNOSIS — Z5181 Encounter for therapeutic drug level monitoring: Secondary | ICD-10-CM

## 2020-01-17 LAB — POCT INR: INR: 4 — AB (ref 2.0–3.0)

## 2020-01-17 NOTE — Patient Instructions (Signed)
Description   Spoke with pt and advised pt to take 1/2 tablet tomorrow then continue taking Warfarin 1 tablet daily except for 1/2 tablet on Sundays and Thursdays. Recheck INR in 2 weeks. Self-tester.

## 2020-01-18 ENCOUNTER — Telehealth: Payer: Self-pay | Admitting: Interventional Cardiology

## 2020-01-18 DIAGNOSIS — I5022 Chronic systolic (congestive) heart failure: Secondary | ICD-10-CM

## 2020-01-18 MED ORDER — SPIRONOLACTONE 25 MG PO TABS
12.5000 mg | ORAL_TABLET | Freq: Every day | ORAL | 3 refills | Status: DC
Start: 2020-01-18 — End: 2020-11-05

## 2020-01-18 MED ORDER — DAPAGLIFLOZIN PROPANEDIOL 5 MG PO TABS
5.0000 mg | ORAL_TABLET | Freq: Every day | ORAL | 11 refills | Status: DC
Start: 2020-01-18 — End: 2020-12-15

## 2020-01-18 MED ORDER — FUROSEMIDE 20 MG PO TABS
ORAL_TABLET | ORAL | 3 refills | Status: DC
Start: 2020-01-18 — End: 2020-02-28

## 2020-01-18 NOTE — Telephone Encounter (Signed)
Austin Crome, MD  Loren Racer, RN; Lajean Manes, MD Let the patient know he needs to be back on therapy for weak heart. Start Spironolactone 12.5 mg daily and Farxiga 5 mg daily. Decrease furosemide to 20 mg daily. BMET 7 to 10 days. Trying to adjust meds to get fluid out

## 2020-01-18 NOTE — Telephone Encounter (Signed)
    Pt is returning call from [place] about lab results

## 2020-01-21 ENCOUNTER — Telehealth: Payer: Self-pay | Admitting: Interventional Cardiology

## 2020-01-21 ENCOUNTER — Telehealth: Payer: Self-pay | Admitting: *Deleted

## 2020-01-21 ENCOUNTER — Other Ambulatory Visit: Payer: Self-pay

## 2020-01-21 ENCOUNTER — Ambulatory Visit (HOSPITAL_COMMUNITY): Payer: Medicare Other | Attending: Cardiovascular Disease

## 2020-01-21 DIAGNOSIS — I5022 Chronic systolic (congestive) heart failure: Secondary | ICD-10-CM

## 2020-01-21 MED ORDER — CARVEDILOL 3.125 MG PO TABS
3.1250 mg | ORAL_TABLET | Freq: Two times a day (BID) | ORAL | 3 refills | Status: DC
Start: 2020-01-21 — End: 2020-02-28

## 2020-01-21 NOTE — Telephone Encounter (Signed)
-----   Message from Belva Crome, MD sent at 01/21/2020  1:07 PM EDT ----- Let the patient know the heart is much weaker.  Ejection fraction now less than 20%.  In addition dapagliflozin, please start carvedilol 3.125 mg at bedtime for 1 week and then increase to twice daily if blood pressure allows.  Measure blood pressure daily.  Follow-up in office in 2 weeks on carvedilol and dapagliflozin. A copy will be sent to Lajean Manes, MD

## 2020-01-21 NOTE — Telephone Encounter (Signed)
Spoke with pt and reviewed results and recommendations.  Scheduled pt to see Dr. Tamala Julian 7/14. Pt verbalized understanding and was in agreement with this plan.

## 2020-01-21 NOTE — Telephone Encounter (Signed)
Patient returned Austin [name]'s call regarding results. Patient transferred to [facility].

## 2020-01-22 ENCOUNTER — Ambulatory Visit: Payer: Medicare Other | Admitting: Sports Medicine

## 2020-01-25 ENCOUNTER — Other Ambulatory Visit: Payer: Medicare Other

## 2020-01-29 ENCOUNTER — Other Ambulatory Visit: Payer: Self-pay

## 2020-01-29 ENCOUNTER — Other Ambulatory Visit: Payer: Medicare Other

## 2020-01-29 ENCOUNTER — Encounter: Payer: Self-pay | Admitting: Sports Medicine

## 2020-01-29 DIAGNOSIS — I5022 Chronic systolic (congestive) heart failure: Secondary | ICD-10-CM

## 2020-01-29 LAB — BASIC METABOLIC PANEL
BUN/Creatinine Ratio: 20 (ref 10–24)
BUN: 25 mg/dL (ref 8–27)
CO2: 20 mmol/L (ref 20–29)
Calcium: 8.7 mg/dL (ref 8.6–10.2)
Chloride: 102 mmol/L (ref 96–106)
Creatinine, Ser: 1.26 mg/dL (ref 0.76–1.27)
GFR calc Af Amer: 63 mL/min/{1.73_m2} (ref >59)
GFR calc non Af Amer: 55 mL/min/{1.73_m2} — ABNORMAL LOW (ref >59)
Glucose: 93 mg/dL (ref 65–99)
Potassium: 4.3 mmol/L (ref 3.5–5.2)
Sodium: 135 mmol/L (ref 134–144)

## 2020-01-31 ENCOUNTER — Ambulatory Visit (INDEPENDENT_AMBULATORY_CARE_PROVIDER_SITE_OTHER): Payer: Medicare Other | Admitting: *Deleted

## 2020-01-31 ENCOUNTER — Telehealth: Payer: Self-pay | Admitting: Interventional Cardiology

## 2020-01-31 DIAGNOSIS — I4891 Unspecified atrial fibrillation: Secondary | ICD-10-CM | POA: Diagnosis not present

## 2020-01-31 DIAGNOSIS — Z5181 Encounter for therapeutic drug level monitoring: Secondary | ICD-10-CM | POA: Diagnosis not present

## 2020-01-31 LAB — POCT INR: INR: 5.8 — AB (ref 2.0–3.0)

## 2020-01-31 NOTE — Telephone Encounter (Signed)
Spoke with pt regarding INR and he states he took an extra whole pill by accident, please refer to Anticoagulation Encounter for details on instructions.

## 2020-01-31 NOTE — Telephone Encounter (Signed)
[facility] called to report an out of range INR for this patient. When the office calls [phone] any one of the agents can help. No reference number was given

## 2020-01-31 NOTE — Telephone Encounter (Signed)
Spoke with [facility] INR. Pt's Inr was addressed in anticoag encounter from today.

## 2020-01-31 NOTE — Patient Instructions (Addendum)
Description   Spoke with pt and advised pt to hold today's dose and hold tomorrow's dose (pt took extra dose-so did not change dose) then continue taking Warfarin 1 tablet daily except for 1/2 tablet on Sundays and Thursdays. Recheck INR in 1 week. Self-tester.

## 2020-02-04 ENCOUNTER — Ambulatory Visit (INDEPENDENT_AMBULATORY_CARE_PROVIDER_SITE_OTHER): Payer: Medicare Other

## 2020-02-04 DIAGNOSIS — Z95810 Presence of automatic (implantable) cardiac defibrillator: Secondary | ICD-10-CM

## 2020-02-04 DIAGNOSIS — I5022 Chronic systolic (congestive) heart failure: Secondary | ICD-10-CM | POA: Diagnosis not present

## 2020-02-04 NOTE — Progress Notes (Signed)
Cardiology Office Note:    Date:  02/06/2020   ID:  Austin [name], DOB April 20, 1941, MRN [id number]  PCP:  Lajean Manes, MD  Cardiologist:  Sinclair Grooms, MD   Referring MD: Lajean Manes, MD   Chief Complaint  Patient presents with  . Congestive Heart Failure    History of Present Illness:    Austin [name] is a 79 y.o. male with a hx of mechanical mitral valve, chronic anticoagulation therapy, nonischemic cardiomyopathy, biventricular implantable cardiac defibrillator, recurrent ventricular tachycardia,amiodarone for VT suppression,multiple medication regimen to suppress ventricular ectopy, and sleep apnea.  Echo performed June 2021 revealed EF of 20 to 15% and systolic heart failure management titrated/added.  Feels some better.  Not as short of breath.  Been somewhat depressed by the news about low EF.  Denies angina, orthopnea, PND and leg swelling.  No syncope.  A major complaint mental fogginess, fatigue when he awakens in the morning, headache, dry throat, and general malaise.  Past Medical History:  Diagnosis Date  . AICD (automatic cardioverter/defibrillator) present   . Atrial fibrillation ([place])   . 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 Conductive hearing loss   . Intraspinal abscess   . 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 Nonischemic cardiomyopathy ([place])   . Psychosexual dysfunction with inhibited sexual excitement   . S/P mitral valve replacement   . Syncope and collapse   . Unspecified sleep apnea    last sleep study 11/07  . Ventricular tachycardia ([place])   . VT (ventricular tachycardia) ([place])     Past Surgical History:  Procedure Laterality Date  . BIV ICD GENERATOR CHANGEOUT N/A 05/04/2017   Procedure: BiV ICD Generator Changeout;  Surgeon: Evans Lance, MD;  Location: [facility];  Service: Cardiovascular;   Laterality: N/A;  . CARDIAC CATHETERIZATION  04/24/2002  . CARDIAC VALVE REPLACEMENT    . CARDIOVERSION  03/09/2012   Procedure: CARDIOVERSION;  Surgeon: Evans Lance, MD;  Location: [facility];  Service: Cardiovascular;  Laterality: N/A;  . CARDIOVERSION N/A 11/22/2013   Procedure: CARDIOVERSION;  Surgeon: Sanda Klein, MD;  Location: [facility];  Service: Cardiovascular;  Laterality: N/A;  . dental implants    . ELECTROPHYSIOLOGIC STUDY N/A 09/15/2015   Procedure: V Tach Ablation;  Surgeon: Evans Lance, MD;  Location: [place] [facility];  Service: Cardiovascular;  Laterality: N/A;  . Evacution of epidural lumbar epidural abscess  1999  . INSERT / REPLACE / REMOVE PACEMAKER  11/2008  . MITRAL VALVE REPLACEMENT     w [id number]  . PERMANENT PACEMAKER INSERTION N/A 08/05/2011   Procedure: PERMANENT PACEMAKER INSERTION;  Surgeon: Evans Lance, MD;  Location: [facility] C[facility];  Service: Cardiovascular;  Laterality: N/A;  . THYROIDECTOMY    . TONSILLECTOMY    . VALVE REPLACEMENT  2000    Current Medications: Current Meds  Medication Sig  . acetaminophen (TYLENOL) 500 MG tablet Take 1,000 mg by mouth every 6 (six) hours as needed (pain).  Marland Kitchen amiodarone (PACERONE) 200 MG tablet Take one tablet by mouth daily Monday through Friday.  Do NOT take on Saturday or Sunday.  Marland Kitchen amoxicillin (AMOXIL) 500 MG capsule TAKE 4 CAPSULES BY MOUTH 30 MINUTES PRIOR TO DENTAL WORK  . carvedilol (COREG) 3.125 MG tablet Take 1 tablet (3.125 mg total) by mouth 2 (two) times daily.  Marland Kitchen  dapagliflozin propanediol (FARXIGA) 5 MG TABS tablet Take 1 tablet (5 mg total) by mouth daily before breakfast.  . furosemide (LASIX) 20 MG tablet Take 1 tablet by mouth daily.  May take an extra tablet as needed for weight gain.  Marland Kitchen levothyroxine (SYNTHROID) 50 MCG tablet TAKE 1 TABLET BY MOUTH DAILY BEFORE BREAKFAST  . Multiple Vitamin (MULTIVITAMIN WITH MINERALS) TABS tablet Take 1 tablet by mouth daily.  . nitroGLYCERIN  (NITROSTAT) 0.4 MG SL tablet Place 1 tablet (0.4 mg total) under the tongue every 5 (five) minutes x 3 doses as needed for chest pain.  Marland Kitchen spironolactone (ALDACTONE) 25 MG tablet Take 0.5 tablets (12.5 mg total) by mouth daily.  Marland Kitchen warfarin (COUMADIN) 5 MG tablet TAKE AS DIRECTED BY THE COUMADIN CLINIC     Allergies:   Zetia [ezetimibe]   Social History   Socioeconomic History  . Marital status: Married    Spouse name: Not on file  . Number of children: 1  . Years of education: 42  . Highest education level: Master's degree (e.g., MA, MS, MEng, MEd, MSW, MBA)  Occupational History  . Occupation: retired from Engineer, manufacturing systems  Tobacco Use  . Smoking status: Never Smoker  . Smokeless tobacco: Never Used  Vaping Use  . Vaping Use: Never used  Substance and Sexual Activity  . Alcohol use: Yes    Alcohol/week: 0.0 standard drinks    Comment: occasionally  . Drug use: No  . Sexual activity: Not on file  Other Topics Concern  . Not on file  Social History Narrative   Lives with wife in a one story home.  Has one daughter.  Retired.  Education: Masters.   Social Determinants of Health   Financial Resource Strain:   . Difficulty of Paying Living Expenses:   Food Insecurity:   . Worried About Charity fundraiser in the Last Year:   . Arboriculturist in the Last Year:   Transportation Needs:   . Film/video editor (Medical):   Marland Kitchen Lack of Transportation (Non-Medical):   Physical Activity:   . Days of Exercise per Week:   . Minutes of Exercise per Session:   Stress:   . Feeling of Stress :   Social Connections:   . Frequency of Communication with Friends and Family:   . Frequency of Social Gatherings with Friends and Family:   . Attends Religious Services:   . Active Member of Clubs or Organizations:   . Attends Archivist Meetings:   Marland Kitchen Marital Status:      Family History: The patient's family history includes Heart disease in his father and mother; Heart  failure in his father and mother.  ROS:   Please see the history of present illness.    Somewhat depressed.  Headaches, brain fog, fatigue, and excessive daytime sleepiness.  All other systems reviewed and are negative.  EKGs/Labs/Other Studies Reviewed:    The following studies were reviewed today: 2D Doppler echocardiogram January 21, 2020: IMPRESSIONS    1. Left ventricular ejection fraction, by estimation, is 15-20%. The left  ventricle has severely decreased function. The left ventricle demonstrates  global hypokinesis. Left ventricular diastolic function could not be  evaluated.  2. Right ventricular systolic function is severely reduced. The right  ventricular size is moderately enlarged.  3. Left atrial size was severely dilated.  4. Right atrial size was moderately dilated.  5. Mechanical mitral valve. Mean gradient 3 mmHG @ 52 bpm. EOA 1.41 cm2.  The mitral valve has been repaired/replaced. No evidence of mitral valve  regurgitation. There is a mechanical valve present in the mitral position.  Echo findings are consistent  with normal structure and function of the mitral valve prosthesis.  6. The aortic valve is tricuspid. Aortic valve regurgitation is trivial.  Mild to moderate aortic valve sclerosis/calcification is present, without  any evidence of aortic stenosis.   EKG:  EKG not performed  Recent Labs: 01/14/2020: Hemoglobin 14.3; NT-Pro BNP 1,971; Platelets 161 01/29/2020: BUN 25; Creatinine, Ser 1.26; Potassium 4.3; Sodium 135  Recent Lipid Panel    Component Value Date/Time   CHOL 158 08/23/2015 0543   TRIG 130 08/23/2015 0543   HDL 50 08/23/2015 0543   CHOLHDL 3.2 08/23/2015 0543   VLDL 26 08/23/2015 0543   LDLCALC 82 08/23/2015 0543    Physical Exam:    VS:  BP 140/76   Pulse (!) 43   Ht 6' 1.5" (1.867 m)   Wt 200 lb 3.2 oz (90.8 kg)   SpO2 96%   BMI 26.06 kg/m     Wt Readings from Last 3 Encounters:  02/06/20 200 lb 3.2 oz (90.8 kg)   01/14/20 204 lb 12.8 oz (92.9 kg)  09/07/19 207 lb 3.2 oz (94 kg)     GEN: Relatively healthy-appearing. No acute distress HEENT: Normal NECK: No JVD. LYMPHATICS: No lymphadenopathy CARDIAC:  RRR without murmur, gallop, or edema. VASCULAR:  Normal Pulses. No bruits. RESPIRATORY: Slight crackles bilateral.  ABDOMEN: Soft, non-tender, non-distended, No pulsatile mass, MUSCULOSKELETAL: No deformity  SKIN: Warm and dry NEUROLOGIC:  Alert and oriented x 3 PSYCHIATRIC:  Normal affect   ASSESSMENT:    1. Chronic systolic heart failure (HCC)   2. Persistent atrial fibrillation ([place])   3. Automatic implantable cardioverter-defibrillator in situ   4. Chronic anticoagulation   5. S/P mitral valve replacement   6. Encounter for therapeutic drug monitoring   7. Educated about COVID-19 virus infection   8. Obstructive sleep apnea    PLAN:    In order of problems listed above:  1. Currently on Farxiga 5 mg/day, carvedilol 3.125 mg twice daily, Aldactone 12.5 mg/day and today we will start Entresto 24/26 mg 1 tablet twice daily.  Continue Lasix but may need to decrease the dose.  Be met will be done in 7 days. 2. Continue warfarin therapy.  Consider discontinuation of amiodarone although at this point amnio is being used to suppress VT and not for A. fib.  I therefore will leave this to Dr. Lovena Le. 3. Defibrillator without recent shocks therapy for VT. 4. Continue Coumadin therapy 5. Normal mitral valve function based upon auscultation and recent echo.  Mechanical valve closure sounds are heard. 6. Amiodarone therapy followed by Dr. Lovena Le. 7. Vaccinated and practicing social distancing. 8. Brain fog and excessive daytime sleepiness seem to be consistent with sleep apnea.  Needs to be reassessed.  Will make appointment with Dr. Elenore Rota.  Basic metabolic panel 1 week.  Follow-up 4 weeks.   Medication Adjustments/Labs and Tests Ordered: Current medicines are reviewed at length with the  patient today.  Concerns regarding medicines are outlined above.  Orders Placed This Encounter  Procedures  . Basic metabolic panel   Meds ordered this encounter  Medications  . sacubitril-valsartan (ENTRESTO) 24-26 MG    Sig: Take 1 tablet by mouth 2 (two) times daily.    Dispense:  60 tablet    Refill:  11    Patient Instructions  Medication Instructions:  1) START Entresto 24/22m twice daily  *If you need a refill on your cardiac medications before your next appointment, please call your pharmacy*   Lab Work: BMET in 1 week  If you have labs (blood work) drawn today and your tests are completely normal, you will receive your results only by: .Marland KitchenMyChart Message (if you have MyChart) OR . A paper copy in the mail If you have any lab test that is abnormal or we need to change your treatment, we will call you to review the results.   Testing/Procedures: None   Follow-Up: [facility] and your health needs are our priority.  As part of our continuing mission to provide you with exceptional heart care, we have created designated Provider Care Teams.  These Care Teams include your primary Cardiologist (physician) and Advanced Practice Providers (APPs -  Physician Assistants and Nurse Practitioners) who all work together to provide you with the care you need, when you need it.  We recommend signing up for the patient portal called "MyChart".  Sign up information is provided on this After Visit Summary.  MyChart is used to connect with patients for Virtual Visits (Telemedicine).  Patients are able to view lab/test results, encounter notes, upcoming appointments, etc.  Non-urgent messages can be sent to your provider as well.   To learn more about what you can do with MyChart, go to [email or web address]    Your next appointment:   6 week(s)  The format for your next appointment:   In Person  Provider:   You may see Dr. HDaneen Schickor one of the following Advanced  Practice Providers on your designated Care Team:    LTruitt Merle NP  LCecilie Kicks NP  JKathyrn Drown NP    Other Instructions      Signed, HSinclair Grooms MD  02/06/2020 2:23 PM    [place]

## 2020-02-06 ENCOUNTER — Other Ambulatory Visit: Payer: Self-pay

## 2020-02-06 ENCOUNTER — Encounter: Payer: Self-pay | Admitting: Interventional Cardiology

## 2020-02-06 ENCOUNTER — Telehealth: Payer: Self-pay

## 2020-02-06 ENCOUNTER — Other Ambulatory Visit (HOSPITAL_COMMUNITY): Payer: Medicare Other

## 2020-02-06 ENCOUNTER — Ambulatory Visit: Payer: Medicare Other | Admitting: Interventional Cardiology

## 2020-02-06 VITALS — BP 140/76 | HR 43 | Ht 73.5 in | Wt 200.2 lb

## 2020-02-06 DIAGNOSIS — G4733 Obstructive sleep apnea (adult) (pediatric): Secondary | ICD-10-CM

## 2020-02-06 DIAGNOSIS — Z952 Presence of prosthetic heart valve: Secondary | ICD-10-CM

## 2020-02-06 DIAGNOSIS — Z7189 Other specified counseling: Secondary | ICD-10-CM

## 2020-02-06 DIAGNOSIS — I4819 Other persistent atrial fibrillation: Secondary | ICD-10-CM

## 2020-02-06 DIAGNOSIS — Z7901 Long term (current) use of anticoagulants: Secondary | ICD-10-CM

## 2020-02-06 DIAGNOSIS — I5022 Chronic systolic (congestive) heart failure: Secondary | ICD-10-CM

## 2020-02-06 DIAGNOSIS — Z95810 Presence of automatic (implantable) cardiac defibrillator: Secondary | ICD-10-CM | POA: Diagnosis not present

## 2020-02-06 DIAGNOSIS — Z5181 Encounter for therapeutic drug level monitoring: Secondary | ICD-10-CM

## 2020-02-06 MED ORDER — ENTRESTO 24-26 MG PO TABS: 1.0000 | ORAL_TABLET | Freq: Two times a day (BID) | ORAL | 11 refills | Status: DC

## 2020-02-06 NOTE — Patient Instructions (Signed)
Medication Instructions:  1) START Entresto 24/26mg  twice daily  *If you need a refill on your cardiac medications before your next appointment, please call your pharmacy*   Lab Work: BMET in 1 week  If you have labs (blood work) drawn today and your tests are completely normal, you will receive your results only by: Marland Kitchen MyChart Message (if you have MyChart) OR . A paper copy in the mail If you have any lab test that is abnormal or we need to change your treatment, we will call you to review the results.   Testing/Procedures: None   Follow-Up: At [facility], you and your health needs are our priority.  As part of our continuing mission to provide you with exceptional heart care, we have created designated Provider Care Teams.  These Care Teams include your primary Cardiologist (physician) and Advanced Practice Providers (APPs -  Physician Assistants and Nurse Practitioners) who all work together to provide you with the care you need, when you need it.  We recommend signing up for the patient portal called "MyChart".  Sign up information is provided on this After Visit Summary.  MyChart is used to connect with patients for Virtual Visits (Telemedicine).  Patients are able to view lab/test results, encounter notes, upcoming appointments, etc.  Non-urgent messages can be sent to your provider as well.   To learn more about what you can do with MyChart, go to [email or web address].    Your next appointment:   6 week(s)  The format for your next appointment:   In Person  Provider:   You may see Dr. Daneen Schick or one of the following Advanced Practice Providers on your designated Care Team:    Truitt Merle, NP  Cecilie Kicks, NP  Kathyrn Drown, NP    Other Instructions

## 2020-02-06 NOTE — Progress Notes (Signed)
EPIC Encounter for ICM Monitoring  Patient Name: Austin [name] is a 79 y.o. male Date: 02/06/2020 Primary Care Physican: Lajean Manes, MD Primary Cardiologist:Smith Electrophysiologist: Lovena Le 01/01/2020 OfficeWeight: 198-199lbs  Attempted call to patient and unable to reach.   Transmission reviewed.     OptivolThoracic impedancenormal.  Prescribed:Furosemide 20 mgTake 1 tablet by mouth daily. May take an extra tablet as needed for weight gain.  Labs: 01/29/2020 Creatinine 1.26, BUN 25, Potassium 4.3, Sodium 135, GFR 55-63 01/14/2020 Creatinine 1.17, BUN 19, Potassium 4.0, Sodium 140, GFR 60-69  A complete set of results can be found in Results Review.  Recommendations: Unable to reach.    Follow-up plan: ICM clinic phone appointment on8/16/2021. 91 day device clinic remote transmission9/07/2019.   EP/Cardiology Office Visits: 03/11/2020 with Dr. Lovena Le.    Copy of ICM check sent to Dr. Lovena Le.   3 month ICM trend: 02/04/2020    1 Year ICM trend:       [name], RN 02/06/2020 4:20 PM

## 2020-02-06 NOTE — Addendum Note (Signed)
Addended by: Loren Racer on: 02/06/2020 02:43 PM   Modules accepted: Orders

## 2020-02-06 NOTE — Telephone Encounter (Signed)
Remote ICM transmission received.  Attempted call to patient regarding ICM remote transmission and no answer or answering machine. 

## 2020-02-07 ENCOUNTER — Ambulatory Visit: Payer: Medicare Other | Admitting: Podiatry

## 2020-02-07 ENCOUNTER — Ambulatory Visit (INDEPENDENT_AMBULATORY_CARE_PROVIDER_SITE_OTHER): Payer: Medicare Other

## 2020-02-07 ENCOUNTER — Encounter: Payer: Self-pay | Admitting: Podiatry

## 2020-02-07 DIAGNOSIS — T148XXD Other injury of unspecified body region, subsequent encounter: Secondary | ICD-10-CM

## 2020-02-07 DIAGNOSIS — Z7901 Long term (current) use of anticoagulants: Secondary | ICD-10-CM | POA: Diagnosis not present

## 2020-02-07 DIAGNOSIS — Z5181 Encounter for therapeutic drug level monitoring: Secondary | ICD-10-CM | POA: Diagnosis not present

## 2020-02-07 DIAGNOSIS — I739 Peripheral vascular disease, unspecified: Secondary | ICD-10-CM | POA: Diagnosis not present

## 2020-02-07 DIAGNOSIS — I872 Venous insufficiency (chronic) (peripheral): Secondary | ICD-10-CM | POA: Diagnosis not present

## 2020-02-07 DIAGNOSIS — I4891 Unspecified atrial fibrillation: Secondary | ICD-10-CM | POA: Diagnosis not present

## 2020-02-07 DIAGNOSIS — L97522 Non-pressure chronic ulcer of other part of left foot with fat layer exposed: Secondary | ICD-10-CM

## 2020-02-07 LAB — POCT INR: INR: 3.7 — AB (ref 2.0–3.0)

## 2020-02-07 MED ORDER — CEPHALEXIN 500 MG PO CAPS: 500.0000 mg | ORAL_CAPSULE | Freq: Three times a day (TID) | ORAL | 0 refills | Status: AC

## 2020-02-07 MED ORDER — SANTYL 250 UNIT/GM EX OINT
1.0000 | TOPICAL_OINTMENT | Freq: Every day | CUTANEOUS | 0 refills | Status: DC
Start: 2020-02-07 — End: 2020-02-26

## 2020-02-07 NOTE — Telephone Encounter (Signed)
Called pt and advised him of recommendations per Dr. Tamala Julian. Pt verbalized understanding and was agreeable to plan.

## 2020-02-07 NOTE — Progress Notes (Signed)
°  Subjective:  Patient ID: Austin [name], male    DOB: 19-Apr-1941,  MRN: [id number]  Chief Complaint  Patient presents with   Nail Problem    follow up nail check left    79 y.o. male presents with the above complaint. History confirmed with patient. Had a complete nail avulsion on 12/25/19 and has been slow to heal. He has worsening HF that his cardiologist is managing. Has been soaking on and off at home but has not been bandaging.  Objective:  Physical Exam: blanching with elevation noted, dependent rubor present, DP reduced left, PT reduced left, ulceration at left hallux nail bed and venous stasis dermatitis noted. Left Foot: left hallux nail bed with granular base and fibrotic rim, mild erythema to the IPJ, no purulence or malodor. Dimensions 1.0cm x 1.5cm x 0.1cm   Radiographs: X-ray of the left foot:  Assessment:   1. 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 Wound healing, delayed   3. Chronic anticoagulation   4. Peripheral venous insufficiency      Plan:  Patient was evaluated and treated and all questions answered.  - Discussed etiology of this ulcer and contributing factors, he has severe peripheral venous disease, is on chronic anticoagulation and I suspect he has mild PAD.  -ABI performed in office, will order arterial US and multi level pressures with wave forms. Results in office showed elevated incompressible ABI on left of 1.48 and PAD on right with biphasic waveform and slightly decreased amplitudes. Results to be scanned to chart -Referral to [facility] - Santyl ordered, apply daily to ulcer base with bandaid -I recommended a surgical shoe, he would prefer to wear open toed sandals and I think this is reasonable -Keflex prescribed for mild erythema -X-ray reviewed, no evidence of osteomyelitis on plain films   Ulcer left hallux -Debridement as below. -Dressed with antibiotic ointment and Band-Aid. -Continue off-loading with  surgical shoe.  Procedure: Excisional Debridement of Wound Rationale: Removal of non-viable soft tissue from the wound to promote healing.  Anesthesia: none Pre-Debridement Wound Measurements: 1.0 cm x 1.5 cm x 0.1 cm  Post-Debridement Wound Measurements: 1.0 cm x 1.5 cm x 0.1 cm  Type of Debridement: Sharp Excisional Tissue Removed: Non-viable soft tissue Depth of Debridement: subcutaneous tissue. Technique: Sharp excisional debridement to bleeding, viable wound base.  Dressing: Dry, sterile, compression dressing. Disposition: Patient tolerated procedure well. Patient to return in 1 week for follow-up.  Return in about 2 weeks (around 02/21/2020) for wound re-check left great toe.       Lanae Crumbly, DPM 02/07/2020     Return in about 2 weeks (around 02/21/2020).

## 2020-02-07 NOTE — Patient Instructions (Signed)
Description   Spoke with pt and advised pt to take 1/2 tablet x 2 dosages, then resume same dosage 1 tablet daily except for 1/2 tablet on Sundays and Thursdays. Recheck INR in 2 weeks. Self-tester.

## 2020-02-07 NOTE — Patient Instructions (Signed)
Monitor for any signs/symptoms of infection. Signs of an infection could be redness beyond the site of the incision/procedure/wound, foul smelling odor, drainage that is thick and yellow or green, or severe swelling and pain. Call the office immediately if any occur or go directly to the emergency room. Call with any questions/concerns.   DRESSING CHANGES LEFT FOOT:  WEAR SURGICAL SHOE AT ALL TIMES    1. KEEP LEFT  FOOT DRY AT ALL TIMES!!!!  2. CLEANSE ULCER WITH SALINE.  3. DAB DRY WITH GAUZE SPONGE.  4. APPLY A LIGHT AMOUNT OF SANTYL OINTMENT TO BASE OF ULCER.  5. APPLY BAND-AID AS INSTRUCTED.  6. WEAR SURGICAL SHOE DAILY AT ALL TIMES.  7. DO NOT WALK BAREFOOT!!!  8.  IF YOU EXPERIENCE ANY FEVER, CHILLS, NIGHTSWEATS, NAUSEA OR VOMITING, ELEVATED OR LOW BLOOD SUGARS, REPORT TO EMERGENCY ROOM.  9. IF YOU EXPERIENCE INCREASED REDNESS, PAIN, SWELLING, DISCOLORATION, ODOR, PUS, DRAINAGE OR WARMTH OF YOUR FOOT, REPORT TO EMERGENCY ROOM.

## 2020-02-08 ENCOUNTER — Other Ambulatory Visit: Payer: Self-pay | Admitting: *Deleted

## 2020-02-08 DIAGNOSIS — G4733 Obstructive sleep apnea (adult) (pediatric): Secondary | ICD-10-CM

## 2020-02-11 ENCOUNTER — Telehealth: Payer: Self-pay | Admitting: *Deleted

## 2020-02-11 DIAGNOSIS — I739 Peripheral vascular disease, unspecified: Secondary | ICD-10-CM

## 2020-02-11 DIAGNOSIS — Z7901 Long term (current) use of anticoagulants: Secondary | ICD-10-CM

## 2020-02-11 DIAGNOSIS — L97522 Non-pressure chronic ulcer of other part of left foot with fat layer exposed: Secondary | ICD-10-CM

## 2020-02-11 DIAGNOSIS — I872 Venous insufficiency (chronic) (peripheral): Secondary | ICD-10-CM

## 2020-02-11 DIAGNOSIS — T148XXD Other injury of unspecified body region, subsequent encounter: Secondary | ICD-10-CM

## 2020-02-11 NOTE — Telephone Encounter (Signed)
Faxed required form for referral, clinical and demographics with orders for ABI with TBI with waveforms, and arterial dopplers left to [facility].

## 2020-02-11 NOTE — Telephone Encounter (Signed)
-----   Message from Criselda Peaches, DPM sent at 02/08/2020  8:52 AM EDT ----- Hi Val,  Could you order a left lower arterial ultrasound, multi level ABI/TBI with waveforms, and place a referral for [facility]? Dx is left hallux ulcer and peripheral vascular disease.  Thanks! Lanae Crumbly, DPM

## 2020-02-15 ENCOUNTER — Other Ambulatory Visit: Payer: Medicare Other | Admitting: *Deleted

## 2020-02-15 ENCOUNTER — Other Ambulatory Visit: Payer: Self-pay

## 2020-02-15 DIAGNOSIS — I5022 Chronic systolic (congestive) heart failure: Secondary | ICD-10-CM

## 2020-02-15 LAB — BASIC METABOLIC PANEL
BUN/Creatinine Ratio: 16 (ref 10–24)
BUN: 21 mg/dL (ref 8–27)
CO2: 21 mmol/L (ref 20–29)
Calcium: 8.8 mg/dL (ref 8.6–10.2)
Chloride: 102 mmol/L (ref 96–106)
Creatinine, Ser: 1.28 mg/dL — ABNORMAL HIGH (ref 0.76–1.27)
GFR calc Af Amer: 62 mL/min/{1.73_m2} (ref >59)
GFR calc non Af Amer: 54 mL/min/{1.73_m2} — ABNORMAL LOW (ref >59)
Glucose: 81 mg/dL (ref 65–99)
Potassium: 4.4 mmol/L (ref 3.5–5.2)
Sodium: 136 mmol/L (ref 134–144)

## 2020-02-21 ENCOUNTER — Ambulatory Visit (INDEPENDENT_AMBULATORY_CARE_PROVIDER_SITE_OTHER): Payer: Medicare Other

## 2020-02-21 DIAGNOSIS — Z5181 Encounter for therapeutic drug level monitoring: Secondary | ICD-10-CM

## 2020-02-21 DIAGNOSIS — I4891 Unspecified atrial fibrillation: Secondary | ICD-10-CM

## 2020-02-21 LAB — POCT INR: INR: 4.5 — AB (ref 2.0–3.0)

## 2020-02-21 NOTE — Patient Instructions (Signed)
Description   Spoke with pt and advised pt to skip today's dosage, then start taking 1 tablet daily except for 1/2 tablet on Sundays, Tuesdays and Thursdays. Recheck INR in 1 week. Self-tester usually tests every 2 weeks.

## 2020-02-22 ENCOUNTER — Other Ambulatory Visit: Payer: Self-pay

## 2020-02-22 ENCOUNTER — Ambulatory Visit (INDEPENDENT_AMBULATORY_CARE_PROVIDER_SITE_OTHER): Payer: Medicare Other | Admitting: Podiatry

## 2020-02-22 DIAGNOSIS — Z5321 Procedure and treatment not carried out due to patient leaving prior to being seen by health care provider: Secondary | ICD-10-CM

## 2020-02-24 NOTE — Progress Notes (Signed)
Mr. Alberg came for his appointment today on time, however I was delayed in a intraoperative emergency and was late to meeting him.  He had another appointment and had to leave.  No charges were filed for the visit and I will evaluate him next week.  Lanae Crumbly, DPM 02/24/2020

## 2020-02-25 NOTE — Telephone Encounter (Signed)
Left message for patient to call back  

## 2020-02-25 NOTE — Telephone Encounter (Signed)
Spoke with the patient who states that his blood pressure has been running low for the past few days. He states that he is having some weakness and lightheadedness as well. He states he is trying to stay hydrated. Blood pressure earlier today was 101/63. Last check it was 136/78. Patient denies any other symptoms. Appointment with Pharm D on Thursday.

## 2020-02-25 NOTE — Telephone Encounter (Signed)
Follow up  Patient returning call. Please give patient a call back.

## 2020-02-26 ENCOUNTER — Other Ambulatory Visit: Payer: Self-pay

## 2020-02-26 ENCOUNTER — Ambulatory Visit: Payer: Medicare Other | Admitting: Podiatry

## 2020-02-26 ENCOUNTER — Encounter: Payer: Self-pay | Admitting: Podiatry

## 2020-02-26 VITALS — Temp 97.3°F

## 2020-02-26 DIAGNOSIS — B351 Tinea unguium: Secondary | ICD-10-CM | POA: Diagnosis not present

## 2020-02-26 DIAGNOSIS — I739 Peripheral vascular disease, unspecified: Secondary | ICD-10-CM | POA: Diagnosis not present

## 2020-02-26 DIAGNOSIS — L97522 Non-pressure chronic ulcer of other part of left foot with fat layer exposed: Secondary | ICD-10-CM

## 2020-02-26 DIAGNOSIS — T148XXD Other injury of unspecified body region, subsequent encounter: Secondary | ICD-10-CM | POA: Diagnosis not present

## 2020-02-26 NOTE — Progress Notes (Signed)
  Subjective:  Patient ID: Austin [name], male    DOB: 10-01-1940,  MRN: [id number]  Chief Complaint  Patient presents with  . Wound Check/Nail Check    Follow-up; Left foot; Hallux; pt stated, "I did not buy Santyl - was too expensive; going to a vein specialist on Friday"  . Debridement    Bilateral nail tirm    79 y.o. male presents with the above complaint. History confirmed with patient. He came to see me last Friday, however I was delayed in surgery was not able to examine him.  He did not by [facility] as it was too expensive, has been putting Betadine ointment on the toe with a Band-Aid  Objective:  Physical Exam: blanching with elevation noted, dependent rubor present, DP reduced left, PT reduced left, ulceration at left hallux nail bed and venous stasis dermatitis noted. Left Foot: left hallux nail bed with now completely fibrotic, mild erythema to the IPJ, no purulence or malodor. Dimensions 1.0cm x 1.5cm x 0.1cm.  Overall unchanged in size and character since visit   Assessment:   1. 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 Wound healing, delayed   3. PAD (peripheral artery disease) ([place])   4. Onychomycosis      Plan:  Patient was evaluated and treated and all questions answered.  - Discussed etiology of this ulcer and contributing factors, he has severe peripheral venous disease, is on chronic anticoagulation and I suspect he has mild PAD.  -Continue Betadine ointment application daily -He is having his vascular evaluation on Friday, and I think intervention on this will be the most important factor for him for wound healing.  He also told me he does have worsening CHF with an EF of 15%, and hopefully this does not preclude him from potential revascularization  Lanae Crumbly, DPM 02/27/2020     Return in about 2 weeks (around 03/11/2020) for wound re-check.

## 2020-02-28 ENCOUNTER — Ambulatory Visit: Payer: Medicare Other | Admitting: Sports Medicine

## 2020-02-28 ENCOUNTER — Ambulatory Visit (INDEPENDENT_AMBULATORY_CARE_PROVIDER_SITE_OTHER): Payer: Medicare Other | Admitting: Cardiovascular Disease

## 2020-02-28 ENCOUNTER — Other Ambulatory Visit: Payer: Self-pay

## 2020-02-28 ENCOUNTER — Ambulatory Visit (INDEPENDENT_AMBULATORY_CARE_PROVIDER_SITE_OTHER): Payer: Medicare Other | Admitting: Pharmacist

## 2020-02-28 VITALS — BP 130/74 | HR 51 | Wt 202.2 lb

## 2020-02-28 DIAGNOSIS — I5022 Chronic systolic (congestive) heart failure: Secondary | ICD-10-CM | POA: Diagnosis not present

## 2020-02-28 DIAGNOSIS — Z5181 Encounter for therapeutic drug level monitoring: Secondary | ICD-10-CM

## 2020-02-28 LAB — POCT INR: INR: 3.7 — AB (ref 2.0–3.0)

## 2020-02-28 MED ORDER — METOPROLOL SUCCINATE ER 25 MG PO TB24
12.5000 mg | ORAL_TABLET | Freq: Every day | ORAL | 11 refills | Status: DC
Start: 2020-02-28 — End: 2020-12-15

## 2020-02-28 NOTE — Patient Instructions (Signed)
Description   Spoke with pt and advised pt to continue taking 1 tablet daily except for 1/2 tablet on Sundays, Tuesdays and Thursdays. Instructed for pt to eat an extra serving of greens today. Recheck INR in 2 weeks.

## 2020-02-28 NOTE — Patient Instructions (Signed)
STOP taking carvedilol  START taking metoprolol succinate 1/2 tablet once daily  Continue taking your other medications  Monitor your blood pressure at home. Bring in your blood pressure cuff and readings to your next visit with Dr Lovena Le. You'll meet with the pharmacist after to review your readings

## 2020-02-28 NOTE — Progress Notes (Signed)
Patient ID: Austin [name]                 DOB: 1941/01/06                      MRN: [id number]     HPI: WEAVER TWEED is a 79 y.o. male referred by Dr. Tamala Julian to pharmacy clinic for HF medication management. PMH is significant for mechanical MVR, NICMP, BiV ICD, recurrent v tach, and OSA . Most recent LVEF was 20-25% on echo from June 2021. At most recent office visit with Dr Tamala Julian on 02/06/20, pt was started on low dose Entresto 24-26mg  BID. Pt sent MyChart message the next day stating BP dropped to 90s/50s on Entresto, accompanied by weakness. He was advised to decrease spironolactone to 12.5mg  on MWF and to take just the PM dose of Entresto for 1 week. If SBP > 100, he should increase Entresto back to BID frequency. Pt sent follow up message on 4/0/98 reporting systolic BP remaining < 119 accompanied by weakness, lightheadedness, or headache. He was referred to PharmD clinic for further med follow up.  Pt reports adherence to his medications. He has been taking spironolactone 1/2 tab on MWF and has increased Entresto back to 1 tab BID. He has been taking furosemide 40mg  on the days he does not take spironolactone (med list stated 20mg  daily). Home BP readings have varied with reported low of 60s/40s, typically systolic ranges 147-829F. Pt feels worse when SBP is < 100 and when his HR is low. Feels like he has less strength or develops a headache. Pacemaker set to kick in when HR is 40. He has been monitoring his BP at home frequently with a bicep cuff he purchased a few months ago. Systolic this AM was 621, higher at 130 in clinic today.  Reports muscle fatigue and brain fog that he attributes to his amiodarone. Plans to discuss this when he sees Dr Lovena Le later this month. Has been weighing himself at home daily, stable at 195 lbs.   Current CHF meds: carvedilol 3.125mg  BID, Farxiga 5mg  daily, Entresto 24-26mg  BID, spironolactone 12.5mg  MWF, furosemide 20mg  daily and prn  BP goal:  <130/27mmHg  Family History: The patient's family history includes Heart disease in his father and mother; Heart failure in his father and mother.  Social History: Occasional alcohol, denies illicit drug and tobacco use.  Diet: low sodium diet  Exercise: limited due to SOB  Wt Readings from Last 3 Encounters:  02/06/20 200 lb 3.2 oz (90.8 kg)  01/14/20 204 lb 12.8 oz (92.9 kg)  09/07/19 207 lb 3.2 oz (94 kg)   BP Readings from Last 3 Encounters:  02/06/20 140/76  01/14/20 (!) 142/76  09/07/19 118/64   Pulse Readings from Last 3 Encounters:  02/06/20 (!) 43  01/14/20 (!) 51  09/07/19 (!) 57    Renal function: CrCl cannot be calculated (Unknown ideal weight.).  Past Medical History:  Diagnosis Date  . AICD (automatic cardioverter/defibrillator) present   . Atrial fibrillation ([place])   . 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 Conductive hearing loss   . Intraspinal abscess   . 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 Nonischemic cardiomyopathy ([place])   . Psychosexual dysfunction with inhibited sexual excitement   . S/P mitral valve replacement   . Syncope and collapse   . Unspecified  sleep apnea    last sleep study 11/07  . Ventricular tachycardia ([place])   . VT (ventricular tachycardia) ([place])     Current Outpatient Medications on File Prior to Visit  Medication Sig Dispense Refill  . acetaminophen (TYLENOL) 500 MG tablet Take 1,000 mg by mouth every 6 (six) hours as needed (pain).    Marland Kitchen amiodarone (PACERONE) 200 MG tablet Take one tablet by mouth daily Monday through Friday.  Do NOT take on Saturday or Sunday. 90 tablet 3  . amoxicillin (AMOXIL) 500 MG capsule TAKE 4 CAPSULES BY MOUTH 30 MINUTES PRIOR TO DENTAL WORK 4 capsule 2  . carvedilol (COREG) 3.125 MG tablet Take 1 tablet (3.125 mg total) by mouth 2 (two) times daily. 180 tablet 3  .  dapagliflozin propanediol (FARXIGA) 5 MG TABS tablet Take 1 tablet (5 mg total) by mouth daily before breakfast. 30 tablet 11  . ezetimibe (ZETIA) 10 MG tablet Take 10 mg by mouth daily.    . furosemide (LASIX) 20 MG tablet Take 1 tablet by mouth daily.  May take an extra tablet as needed for weight gain. 180 tablet 3  . levothyroxine (SYNTHROID) 50 MCG tablet TAKE 1 TABLET BY MOUTH DAILY BEFORE BREAKFAST 90 tablet 2  . Multiple Vitamin (MULTIVITAMIN WITH MINERALS) TABS tablet Take 1 tablet by mouth daily.    . nitroGLYCERIN (NITROSTAT) 0.4 MG SL tablet Place 1 tablet (0.4 mg total) under the tongue every 5 (five) minutes x 3 doses as needed for chest pain. 25 tablet 2  . sacubitril-valsartan (ENTRESTO) 24-26 MG Take 1 tablet by mouth 2 (two) times daily. 60 tablet 11  . spironolactone (ALDACTONE) 25 MG tablet Take 0.5 tablets (12.5 mg total) by mouth daily. 45 tablet 3  . warfarin (COUMADIN) 5 MG tablet TAKE AS DIRECTED BY THE COUMADIN CLINIC 90 tablet 0   No current facility-administered medications on file prior to visit.    Allergies  Allergen Reactions  . Zetia [Ezetimibe]     Pt stated, "Upset my stomach"     Assessment/Plan:  1. CHF - BP normal in clinic today at goal <130/53mmHg, however pt has had episodes of hypotension at home. Will stop carvedilol 3.125mg  BID and start Toprol 12.5mg  daily for less effect on BP which will hopefully allow [place] to titrate his other CHF meds better. For now, will continue Entresto 24-26mg  BID, Farxiga 5mg  daily, spironolactone 12.5mg  MWF, and Lasix 40mg  on days he does not take spironolactone. He will monitor and record his BP and HR at home and will bring cuff to next visit in 2 weeks, same day as appt with Dr Lovena Le for convenience.  Tomie Spizzirri E. Ozan Maclay, PharmD, BCACP, [facility] [address] [place], [place] [address] Phone: [phone]; Fax: [phone] 02/28/2020 4:10 PM

## 2020-02-29 ENCOUNTER — Ambulatory Visit (INDEPENDENT_AMBULATORY_CARE_PROVIDER_SITE_OTHER)
Admission: RE | Admit: 2020-02-29 | Discharge: 2020-02-29 | Disposition: A | Discharge status: Home / Self Care | Payer: Medicare Other | Source: Ambulatory Visit | Attending: Podiatry | Admitting: Podiatry

## 2020-02-29 ENCOUNTER — Ambulatory Visit (HOSPITAL_COMMUNITY)
Admission: RE | Admit: 2020-02-29 | Discharge: 2020-02-29 | Disposition: A | Discharge status: Home / Self Care | Payer: Medicare Other | Source: Ambulatory Visit | Attending: Podiatry | Admitting: Podiatry

## 2020-02-29 DIAGNOSIS — Z7901 Long term (current) use of anticoagulants: Secondary | ICD-10-CM | POA: Diagnosis present

## 2020-02-29 DIAGNOSIS — I872 Venous insufficiency (chronic) (peripheral): Secondary | ICD-10-CM

## 2020-02-29 DIAGNOSIS — I739 Peripheral vascular disease, unspecified: Secondary | ICD-10-CM

## 2020-02-29 DIAGNOSIS — L97522 Non-pressure chronic ulcer of other part of left foot with fat layer exposed: Secondary | ICD-10-CM | POA: Diagnosis present

## 2020-02-29 DIAGNOSIS — T148XXD Other injury of unspecified body region, subsequent encounter: Secondary | ICD-10-CM | POA: Diagnosis present

## 2020-03-03 ENCOUNTER — Telehealth: Payer: Self-pay | Admitting: *Deleted

## 2020-03-03 DIAGNOSIS — I739 Peripheral vascular disease, unspecified: Secondary | ICD-10-CM

## 2020-03-03 DIAGNOSIS — T148XXD Other injury of unspecified body region, subsequent encounter: Secondary | ICD-10-CM

## 2020-03-03 DIAGNOSIS — L97522 Non-pressure chronic ulcer of other part of left foot with fat layer exposed: Secondary | ICD-10-CM

## 2020-03-03 NOTE — Telephone Encounter (Signed)
Faxed required form, clinicals and demographics to [facility]. 

## 2020-03-03 NOTE — Telephone Encounter (Signed)
-----   Message from Criselda Peaches, DPM sent at 03/01/2020 10:09 AM EDT ----- Regarding: [facility] referral Hi Val, could you refer [name] to [facility]? He just completed his testing and has some lower extremity PAD and a non-healing great toe ulcer on the left.  Thanks! Quita Skye

## 2020-03-06 ENCOUNTER — Ambulatory Visit: Payer: Medicare Other | Admitting: Vascular Surgery

## 2020-03-06 ENCOUNTER — Other Ambulatory Visit: Payer: Self-pay

## 2020-03-06 ENCOUNTER — Encounter: Payer: Self-pay | Admitting: Vascular Surgery

## 2020-03-06 VITALS — BP 110/73 | HR 47 | Temp 97.6°F | Resp 20 | Ht 73.5 in | Wt 200.0 lb

## 2020-03-06 DIAGNOSIS — I739 Peripheral vascular disease, unspecified: Secondary | ICD-10-CM | POA: Diagnosis not present

## 2020-03-06 NOTE — Progress Notes (Signed)
Referring Physician: Dr. Lanae Crumbly  Patient name: Austin [name] MRN: [id number] DOB: [birth year] Sex: male  REASON FOR CONSULT: Nonhealing wound left first toe  HPI: Austin [name] is a 79 y.o. male, who underwent removal of a left first toenail about 6 weeks ago by Dr. Cannon Kettle.  The wound has failed to heal.  Patient states he previously had a toenail removed from the right first toe which did heal spontaneously.  He does not have claudication.  He does not have rest pain.  Other medical problems include prior history of atrial fibrillation and V. tach.  He does have an AICD.  He is also had mitral valve replacement and is on chronic warfarin for this.  He is a non-smoker.  He is on Zetia for cholesterol control.  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    cx by infection, explantation11/12 & reimplant 1/13   Conductive hearing loss    Intraspinal abscess    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   Nonischemic cardiomyopathy ([place])    Psychosexual dysfunction with inhibited sexual excitement    S/P mitral valve replacement    Syncope and collapse    Unspecified sleep apnea    last sleep study 11/07   Ventricular tachycardia ([place])    VT (ventricular tachycardia) ([place])    Past Surgical History:  Procedure Laterality Date   BIV ICD GENERATOR CHANGEOUT N/A 05/04/2017   Procedure: BiV ICD Generator Changeout;  Surgeon: Evans Lance, MD;  Location: [place] [facility];  Service: Cardiovascular;  Laterality: N/A;   CARDIAC CATHETERIZATION  04/24/2002   CARDIAC VALVE REPLACEMENT     CARDIOVERSION  03/09/2012   Procedure: CARDIOVERSION;  Surgeon: Evans Lance, MD;  Location: [place];  Service: Cardiovascular;  Laterality: N/A;   CARDIOVERSION N/A 11/22/2013   Procedure:  CARDIOVERSION;  Surgeon: Sanda Klein, MD;  Location: [facility];  Service: Cardiovascular;  Laterality: N/A;   dental implants     ELECTROPHYSIOLOGIC STUDY N/A 09/15/2015   Procedure: Stephanie Coup Ablation;  Surgeon: Evans Lance, MD;  Location: [place] [facility];  Service: Cardiovascular;  Laterality: N/A;   Evacution of epidural lumbar epidural abscess  1999   INSERT / REPLACE / REMOVE PACEMAKER  11/2008   MITRAL VALVE REPLACEMENT     w [id number]   PERMANENT PACEMAKER INSERTION N/A 08/05/2011   Procedure: PERMANENT PACEMAKER INSERTION;  Surgeon: Evans Lance, MD;  Location[facility];  Service: Cardiovascular;  Laterality: N/A;   THYROIDECTOMY     TONSILLECTOMY     VALVE REPLACEMENT  2000    Family History  Problem Relation Age of Onset   Heart disease Mother    Heart failure Mother    Heart disease Father    Heart failure Father     SOCIAL HISTORY: Social History   Socioeconomic History   Marital status: Married    Spouse name: Not on file   Number of children: 1   Years of education: 43   Highest education level: Master's degree (e.g., MA, MS, MEng, MEd, MSW, MBA)  Occupational History   Occupation: retired from Engineer, manufacturing systems  Tobacco Use   Smoking status: Never Smoker   Smokeless tobacco: Never Used  Scientific laboratory technician Use: Never used  Substance and Sexual Activity  Alcohol use: Yes    Alcohol/week: 0.0 standard drinks    Comment: occasionally   Drug use: No   Sexual activity: Not on file  Other Topics Concern   Not on file  Social History Narrative   Lives with wife in a one story home.  Has one daughter.  Retired.  Education: Masters.   Social Determinants of Health   Financial Resource Strain:    Difficulty of Paying Living Expenses:   Food Insecurity:    Worried About Charity fundraiser in the Last Year:    Arboriculturist in the Last Year:   Transportation Needs:    Film/video editor (Medical):     Lack of Transportation (Non-Medical):   Physical Activity:    Days of Exercise per Week:    Minutes of Exercise per Session:   Stress:    Feeling of Stress :   Social Connections:    Frequency of Communication with Friends and Family:    Frequency of Social Gatherings with Friends and Family:    Attends Religious Services:    Active Member of Clubs or Organizations:    Attends Archivist Meetings:    Marital Status:   Intimate Partner Violence:    Fear of Current or Ex-Partner:    Emotionally Abused:    Physically Abused:    Sexually Abused:     Allergies  Allergen Reactions   Zetia [Ezetimibe]     Pt stated, "Upset my stomach"    Current Outpatient Medications  Medication Sig Dispense Refill   acetaminophen (TYLENOL) 500 MG tablet Take 1,000 mg by mouth every 6 (six) hours as needed (pain).     amiodarone (PACERONE) 200 MG tablet Take one tablet by mouth daily Monday through Friday.  Do NOT take on Saturday or Sunday. 90 tablet 3   amoxicillin (AMOXIL) 500 MG capsule TAKE 4 CAPSULES BY MOUTH 30 MINUTES PRIOR TO DENTAL WORK 4 capsule 2   carvedilol (COREG) 3.125 MG tablet Take by mouth.     cephALEXin (KEFLEX) 500 MG capsule Take by mouth.     dapagliflozin propanediol (FARXIGA) 5 MG TABS tablet Take 1 tablet (5 mg total) by mouth daily before breakfast. 30 tablet 11   ezetimibe (ZETIA) 10 MG tablet Take 10 mg by mouth daily.     furosemide (LASIX) 20 MG tablet Take 40 mg by mouth. Take 2 tablets by mouth on the days you do not take spironolactone     levothyroxine (SYNTHROID) 50 MCG tablet TAKE 1 TABLET BY MOUTH DAILY BEFORE BREAKFAST 90 tablet 2   metoprolol succinate (TOPROL XL) 25 MG 24 hr tablet Take 0.5 tablets (12.5 mg total) by mouth daily. 15 tablet 11   Multiple Vitamin (MULTIVITAMIN WITH MINERALS) TABS tablet Take 1 tablet by mouth daily.     nitroGLYCERIN (NITROSTAT) 0.4 MG SL tablet Place 1 tablet (0.4 mg total) under the tongue  every 5 (five) minutes x 3 doses as needed for chest pain. 25 tablet 2   sacubitril-valsartan (ENTRESTO) 24-26 MG Take 1 tablet by mouth 2 (two) times daily. 60 tablet 11   spironolactone (ALDACTONE) 25 MG tablet Take 0.5 tablets (12.5 mg total) by mouth daily. (Patient taking differently: Take 12.5 mg by mouth every Monday, Wednesday, and Friday. ) 45 tablet 3   warfarin (COUMADIN) 5 MG tablet TAKE AS DIRECTED BY THE COUMADIN CLINIC 90 tablet 0   No current facility-administered medications for this visit.    ROS:  General:  No weight loss, Fever, chills  HEENT: No recent headaches, no nasal bleeding, no visual changes, no sore throat  Neurologic: No dizziness, blackouts, seizures. No recent symptoms of stroke or mini- stroke. No recent episodes of slurred speech, or temporary blindness.  Cardiac: No recent episodes of chest pain/pressure, no shortness of breath at rest.  No shortness of breath with exertion.  Denies history of atrial fibrillation or irregular heartbeat  Vascular: No history of rest pain in feet.  No history of claudication.  + history of non-healing ulcer, No history of DVT   Pulmonary: No home oxygen, no productive cough, no hemoptysis,  No asthma or wheezing  Musculoskeletal:  [ ]  Arthritis, [ ]  Low back pain,  [ ]  Joint pain  Hematologic:No history of hypercoagulable state.  No history of easy bleeding.  No history of anemia  Gastrointestinal: No hematochezia or melena,  No gastroesophageal reflux, no trouble swallowing  Urinary: [ ]  chronic Kidney disease, [ ]  on HD - [ ]  MWF or [ ]  TTHS, [ ]  Burning with urination, [ ]  Frequent urination, [ ]  Difficulty urinating;   Skin: No rashes  Psychological: No history of anxiety,  No history of depression   Physical Examination  Vitals:   03/06/20 0948  BP: 110/73  Pulse: (!) 47  Resp: 20  Temp: 97.6 F (36.4 C)  SpO2: 95%  Weight: 200 lb (90.7 kg)  Height: 6' 1.5" (1.867 m)    Body mass index is  26.03 kg/m.  General:  Alert and oriented, no acute distress HEENT: Normal Neck: No JVD Cardiac: Regular Rate and Rhythm Abdomen: Soft, non-tender, non-distended, no mass Skin: No rash, left first toe 2 mm ulceration in the bed of the left first toenail, hemosiderin staining gaiter area bilaterally Extremity Pulses:  2+ radial, brachial, femoral, absent popliteal dorsalis pedis, posterior tibial pulses bilaterally Musculoskeletal: No deformity or edema  Neurologic: Upper and lower extremity motor 5/5 and symmetric  DATA:  Patient had bilateral ABIs conducted on March 01, 2020 which I reviewed today.  This showed calcified vessels with unreliable ABI.  He then subsequently had a duplex ultrasound performed which showed monophasic flow in the anterior tibial and posterior tibial artery of the left leg.  This is suggestive of tibial artery occlusive disease.  ASSESSMENT: Slowly healing ulceration left first toe.  The ulcer is fairly small at this point.  I believe rather than an intervention at this point we should give this a few more weeks to see if it heal spontaneously.   PLAN: Patient will follow up with me in 4 to 6 weeks.  If the toe wound has not healed at that point we will talk about proceeding with arteriography.  He will call me sooner if the wound deteriorates.  Ruta Hinds, MD [facility] Office: [phone]

## 2020-03-10 ENCOUNTER — Ambulatory Visit (INDEPENDENT_AMBULATORY_CARE_PROVIDER_SITE_OTHER): Payer: Medicare Other

## 2020-03-10 DIAGNOSIS — I5022 Chronic systolic (congestive) heart failure: Secondary | ICD-10-CM | POA: Diagnosis not present

## 2020-03-10 DIAGNOSIS — Z95810 Presence of automatic (implantable) cardiac defibrillator: Secondary | ICD-10-CM

## 2020-03-10 NOTE — Progress Notes (Signed)
Patient ID: Austin [name]                 DOB: Dec 08, 1940                      MRN: [id number]     HPI: Austin [name] is a 79 y.o. male referred by Dr. Tamala Julian to pharmacy clinic for HF medication management. PMH is significant for mechanical MVR, NICMP, BiV ICD, recurrent v tach, and OSA . Most recent LVEF was 20-25% on echo from June 2021.   At last office visit on 02/28/20 in HF clinic, patient was increased back to [facility] 1 tab BID from daily. Previously on Entresto once daily due to hypotension (90s/50s) accompanied by weakness, lightheadedness, and/or headache. Home BP readings varied with lows of 95G/38V and systolic ranged from 564-332R. Patient reported he felt worse when SBP < 100 and when HR is low - experienced weakness and/or headaches. Pacemaker set to kick in when HR is 40. Carvedilol was discontinued and Toprol 12.5 mg daily was started for less BP effects and to allow room to titrate other CHF meds. All other meds were continued.  Patient presents today in good spirits for further HF medication titration. Patient reports compliance with all BP and HF medications. Reports that headaches, lightheadedness and SOB has improved. Reports no additional swelling than usual. Reports home BP readings in the morning range from 87-100s/50-60s and increases in the afternoon to 110s/60s. He states drinking 8-10 ounces of water in the morning helps increase BP. He checks his weight at home in the morning (normal range 196 lbs) and within the last two weeks, weight has been within 0.5 lbs every day. Additionally, patient reports the Dr. Lovena Le adjusted pacemaker rate to kick in when HR is 60 (previously was set at 40).    Current CHF meds:  Toprol 12.5 mg daily (AM) Farxiga 5 mg daily Entresto 24-26 mg BID Spironolactone 12.5 mg MWF Furosemide 20 mg on days he take spiro (MWF) and 40 mg on other days   BP goal: <130/91mmHg  Family History: The patient'sfamily history includes Heart disease  in his father and mother; Heart failure in his father and mother.  Social History: Occasional alcohol, denies illicit drug and tobacco use.  Diet: low sodium diet  -Breakfast: Cereal, devil eggs -Lunch: lunch is variable: sandwich, hamburger, hotdog, BBQ  -Dinner: meat and two vegetables -limits excessive liquida  Exercise: limited due to SOB   Home BP readings:  87-100s/50-60s in the morning  110s/60 in the afternoon   Wt Readings from Last 3 Encounters:  03/11/20 204 lb 6.4 oz (92.7 kg)  03/11/20 204 lb 6.4 oz (92.7 kg)  03/06/20 200 lb (90.7 kg)   BP Readings from Last 3 Encounters:  03/11/20 122/65  03/11/20 110/62  03/06/20 110/73   Pulse Readings from Last 3 Encounters:  03/11/20 78  03/11/20 (!) 40  03/06/20 (!) 47    Renal function: CrCl cannot be calculated (Patient's most recent lab result is older than the maximum 21 days allowed.).  Past Medical History:  Diagnosis Date  . AICD (automatic cardioverter/defibrillator) present   . Atrial fibrillation ([place])   . 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 Conductive hearing loss   . Intraspinal abscess   . 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 Nonischemic cardiomyopathy ([place])   .  Psychosexual dysfunction with inhibited sexual excitement   . S/P mitral valve replacement   . Syncope and collapse   . Unspecified sleep apnea    last sleep study 11/07  . Ventricular tachycardia ([place])   . VT (ventricular tachycardia) ([place])     Current Outpatient Medications on File Prior to Visit  Medication Sig Dispense Refill  . acetaminophen (TYLENOL) 500 MG tablet Take 1,000 mg by mouth every 6 (six) hours as needed (pain).    Marland Kitchen amiodarone (PACERONE) 200 MG tablet Take one tablet by mouth daily Monday through Friday.  Do NOT take on Saturday or Sunday. 90 tablet 3  . amoxicillin (AMOXIL) 500 MG  capsule TAKE 4 CAPSULES BY MOUTH 30 MINUTES PRIOR TO DENTAL WORK 4 capsule 2  . cephALEXin (KEFLEX) 500 MG capsule Take by mouth.    . dapagliflozin propanediol (FARXIGA) 5 MG TABS tablet Take 1 tablet (5 mg total) by mouth daily before breakfast. 30 tablet 11  . furosemide (LASIX) 20 MG tablet Take 40 mg by mouth. Take 2 tablets by mouth on the days you do not take spironolactone    . levothyroxine (SYNTHROID) 50 MCG tablet TAKE 1 TABLET BY MOUTH DAILY BEFORE BREAKFAST 90 tablet 2  . metoprolol succinate (TOPROL XL) 25 MG 24 hr tablet Take 0.5 tablets (12.5 mg total) by mouth daily. 15 tablet 11  . Multiple Vitamin (MULTIVITAMIN WITH MINERALS) TABS tablet Take 1 tablet by mouth daily.    . nitroGLYCERIN (NITROSTAT) 0.4 MG SL tablet Place 1 tablet (0.4 mg total) under the tongue every 5 (five) minutes x 3 doses as needed for chest pain. 25 tablet 2  . sacubitril-valsartan (ENTRESTO) 24-26 MG Take 1 tablet by mouth 2 (two) times daily. 60 tablet 11  . spironolactone (ALDACTONE) 25 MG tablet Take 0.5 tablets (12.5 mg total) by mouth daily. (Patient taking differently: Take 12.5 mg by mouth every Monday, Wednesday, and Friday. ) 45 tablet 3  . warfarin (COUMADIN) 5 MG tablet TAKE AS DIRECTED BY THE COUMADIN CLINIC 90 tablet 0   No current facility-administered medications on file prior to visit.    Allergies  Allergen Reactions  . Zetia [Ezetimibe]     Pt stated, "Upset my stomach"     Assessment/Plan:  1. CHF - BP at goal of < 130/80 mmHg. Patient brought home BP monitor which measured 122/65 and correlated with clinic BP of 120/67. Additionally, patient saw Dr. Lovena Le today at [facility] prior to our visit and BP was at goal at 110/62 and HR was low at 40. No med changes were made at his visit with Dr. Lovena Le, however pacemaker rate was adjusted to 60. Will continue current BP and HF medications. Discussed possibly adjusting spironolactone and lasix schedule, however patient's weight has been stable,  side-effects have improved, and he has not be experiencing significant hypotension, therefore patient will like to continue current regimen. Can consider changing to spironolactone 12.5 mg daily and lasix 20 mg daily for additional BP and fluid management at next visit. Encouraged patient to eat a healthy diet consisting of lean meat (chicken, [place]), vegetables, and fruits, and to limit red meat, salt, and fried food. Follow-up appointment scheduled in 4 weeks.  Lorel Monaco, PharmD PGY2 [facility] [address], [place], [place] [address] Phone: [phone]; Fax: [phone]

## 2020-03-11 ENCOUNTER — Ambulatory Visit: Payer: Medicare Other

## 2020-03-11 ENCOUNTER — Encounter: Payer: Self-pay | Admitting: Internal Medicine

## 2020-03-11 ENCOUNTER — Ambulatory Visit: Payer: Medicare Other | Admitting: Internal Medicine

## 2020-03-11 ENCOUNTER — Other Ambulatory Visit: Payer: Self-pay

## 2020-03-11 ENCOUNTER — Ambulatory Visit (INDEPENDENT_AMBULATORY_CARE_PROVIDER_SITE_OTHER): Payer: Medicare Other | Admitting: Pharmacist

## 2020-03-11 VITALS — BP 122/65 | HR 78 | Wt 204.4 lb

## 2020-03-11 VITALS — BP 110/62 | HR 40 | Ht 73.5 in | Wt 204.4 lb

## 2020-03-11 DIAGNOSIS — I5022 Chronic systolic (congestive) heart failure: Secondary | ICD-10-CM | POA: Diagnosis not present

## 2020-03-11 DIAGNOSIS — Z95810 Presence of automatic (implantable) cardiac defibrillator: Secondary | ICD-10-CM | POA: Diagnosis not present

## 2020-03-11 DIAGNOSIS — I472 Ventricular tachycardia, unspecified: Secondary | ICD-10-CM

## 2020-03-11 DIAGNOSIS — I4819 Other persistent atrial fibrillation: Secondary | ICD-10-CM | POA: Diagnosis not present

## 2020-03-11 NOTE — Patient Instructions (Addendum)
Medication Instructions:  Your physician recommends that you continue on your current medications as directed. Please refer to the Current Medication list given to you today.  Labwork: None ordered.  Testing/Procedures: None ordered.  Follow-Up: Your physician wants you to follow-up in: 4 months with Dr. Lovena Le.     July 22, 2020 at 1:45 pm with Dr. Lovena Le at the [facility] office  Remote monitoring is used to monitor your ICD from home. This monitoring reduces the number of office visits required to check your device to one time per year. It allows [place] to keep an eye on the functioning of your device to ensure it is working properly. You are scheduled for a device check from home on 03/26/2020. You may send your transmission at any time that day. If you have a wireless device, the transmission will be sent automatically. After your physician reviews your transmission, you will receive a postcard with your next transmission date.  Any Other Special Instructions Will Be Listed Below (If Applicable).  If you need a refill on your cardiac medications before your next appointment, please call your pharmacy.

## 2020-03-11 NOTE — Progress Notes (Signed)
HPI Mr. Dec returns today for followup. He is a pleasant 79 yo man with a h/o chronic systolic heart failure, LV dysfunction, persistent atrial fib/flutter, high grade heart block and VT, s/p ablation. He notes that his HR's have been slow and he feels weak at times. He has not had any ICD therapies. He remains on amiodarone, 1 gm/week.  Allergies  Allergen Reactions  . Zetia [Ezetimibe]     Pt stated, "Upset my stomach"     Current Outpatient Medications  Medication Sig Dispense Refill  . acetaminophen (TYLENOL) 500 MG tablet Take 1,000 mg by mouth every 6 (six) hours as needed (pain).    Marland Kitchen amiodarone (PACERONE) 200 MG tablet Take one tablet by mouth daily Monday through Friday.  Do NOT take on Saturday or Sunday. 90 tablet 3  . amoxicillin (AMOXIL) 500 MG capsule TAKE 4 CAPSULES BY MOUTH 30 MINUTES PRIOR TO DENTAL WORK 4 capsule 2  . cephALEXin (KEFLEX) 500 MG capsule Take by mouth.    . dapagliflozin propanediol (FARXIGA) 5 MG TABS tablet Take 1 tablet (5 mg total) by mouth daily before breakfast. 30 tablet 11  . furosemide (LASIX) 20 MG tablet Take 40 mg by mouth. Take 2 tablets by mouth on the days you do not take spironolactone    . levothyroxine (SYNTHROID) 50 MCG tablet TAKE 1 TABLET BY MOUTH DAILY BEFORE BREAKFAST 90 tablet 2  . metoprolol succinate (TOPROL XL) 25 MG 24 hr tablet Take 0.5 tablets (12.5 mg total) by mouth daily. 15 tablet 11  . Multiple Vitamin (MULTIVITAMIN WITH MINERALS) TABS tablet Take 1 tablet by mouth daily.    . nitroGLYCERIN (NITROSTAT) 0.4 MG SL tablet Place 1 tablet (0.4 mg total) under the tongue every 5 (five) minutes x 3 doses as needed for chest pain. 25 tablet 2  . sacubitril-valsartan (ENTRESTO) 24-26 MG Take 1 tablet by mouth 2 (two) times daily. 60 tablet 11  . spironolactone (ALDACTONE) 25 MG tablet Take 0.5 tablets (12.5 mg total) by mouth daily. (Patient taking differently: Take 12.5 mg by mouth every Monday, Wednesday, and Friday. ) 45  tablet 3  . warfarin (COUMADIN) 5 MG tablet TAKE AS DIRECTED BY THE COUMADIN CLINIC 90 tablet 0   No current facility-administered medications for this visit.     Past Medical History:  Diagnosis Date  . AICD (automatic cardioverter/defibrillator) present   . Atrial fibrillation ([place])   . 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 Conductive hearing loss   . Intraspinal abscess   . 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 Nonischemic cardiomyopathy ([place])   . Psychosexual dysfunction with inhibited sexual excitement   . S/P mitral valve replacement   . Syncope and collapse   . Unspecified sleep apnea    last sleep study 11/07  . Ventricular tachycardia ([place])   . VT (ventricular tachycardia) ([facility])     ROS:   All systems reviewed and negative except as noted in the HPI.   Past Surgical History:  Procedure Laterality Date  . BIV ICD GENERATOR CHANGEOUT N/A 05/04/2017   Procedure: BiV ICD Generator Changeout;  Surgeon: Evans Lance, MD;  Location: [place] [facility];  Service: Cardiovascular;  Laterality: N/A;  . CARDIAC CATHETERIZATION  04/24/2002  . CARDIAC VALVE REPLACEMENT    . CARDIOVERSION  03/09/2012   Procedure: CARDIOVERSION;  Surgeon: Champ Mungo  Lovena Le, MD;  Location: [place];  Service: Cardiovascular;  Laterality: N/A;  . CARDIOVERSION N/A 11/22/2013   Procedure: CARDIOVERSION;  Surgeon: Sanda Klein, MD;  [facility];  Service: Cardiovascular;  Laterality: N/A;  . dental implants    . ELECTROPHYSIOLOGIC STUDY N/A 09/15/2015   Procedure: V Tach Ablation;  Surgeon: Evans Lance, MD;  Location: [place] [facility];  Service: Cardiovascular;  Laterality: N/A;  . Evacution of epidural lumbar epidural abscess  1999  . INSERT / REPLACE / REMOVE PACEMAKER  11/2008  . MITRAL VALVE REPLACEMENT     w #[address] [facility]  . PERMANENT PACEMAKER  INSERTION N/A 08/05/2011   Procedure: PERMANENT PACEMAKER INSERTION;  Surgeon: Evans Lance, MD;  Location: [facility] CATH LAB;  Service: Cardiovascular;  Laterality: N/A;  . THYROIDECTOMY    . TONSILLECTOMY    . VALVE REPLACEMENT  2000     Family History  Problem Relation Age of Onset  . Heart disease Mother   . Heart failure Mother   . Heart disease Father   . Heart failure Father      Social History   Socioeconomic History  . Marital status: Married    Spouse name: Not on file  . Number of children: 1  . Years of education: 59  . Highest education level: Master's degree (e.g., MA, MS, MEng, MEd, MSW, MBA)  Occupational History  . Occupation: retired from Engineer, manufacturing systems  Tobacco Use  . Smoking status: Never Smoker  . Smokeless tobacco: Never Used  Vaping Use  . Vaping Use: Never used  Substance and Sexual Activity  . Alcohol use: Yes    Alcohol/week: 0.0 standard drinks    Comment: occasionally  . Drug use: No  . Sexual activity: Not on file  Other Topics Concern  . Not on file  Social History Narrative   Lives with wife in a one story home.  Has one daughter.  Retired.  Education: Masters.   Social Determinants of Health   Financial Resource Strain:   . Difficulty of Paying Living Expenses:   Food Insecurity:   . Worried About Charity fundraiser in the Last Year:   . Arboriculturist in the Last Year:   Transportation Needs:   . Film/video editor (Medical):   Marland Kitchen Lack of Transportation (Non-Medical):   Physical Activity:   . Days of Exercise per Week:   . Minutes of Exercise per Session:   Stress:   . Feeling of Stress :   Social Connections:   . Frequency of Communication with Friends and Family:   . Frequency of Social Gatherings with Friends and Family:   . Attends Religious Services:   . Active Member of Clubs or Organizations:   . Attends Archivist Meetings:   Marland Kitchen Marital Status:   Intimate Partner Violence:   . Fear of Current or  Ex-Partner:   . Emotionally Abused:   Marland Kitchen Physically Abused:   . Sexually Abused:      BP 110/62   Pulse (!) 40   Ht 6' 1.5" (1.867 m)   Wt 204 lb 6.4 oz (92.7 kg)   SpO2 94%   BMI 26.60 kg/m   Physical Exam:  Well appearing NAD HEENT: Unremarkable Neck:  No JVD, no thyromegally Lymphatics:  No adenopathy Back:  No CVA tenderness Lungs:  Clear with no wheezes HEART:  IRegular rate rhythm, no murmurs, no rubs, no clicks mechanical S1 Abd:  soft, positive bowel  sounds, no organomegally, no rebound, no guarding Ext:  2 plus pulses, no edema, no cyanosis, no clubbing Skin:  No rashes no nodules Neuro:  CN II through XII intact, motor grossly intact  EKG - atrial flutter with ventricular pacing  DEVICE  Normal device function.  See PaceArt for details.   Assess/Plan: 1. Atrial flutter - his rates are well controlled. He has left atrial flutter.  2. 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 I do not appreciate MR or MS on exam.  3. Heart block - he has high grade heart block at least in part due to his meds. I have increased his pacing rate to 60/min.  4. VT - he has had no more VT since his last visit. In our office he went into a slow VT which was pace terminated.  Carleene Overlie Wells Mabe,MD

## 2020-03-11 NOTE — Patient Instructions (Addendum)
It was nice to see you today!  Your goal blood pressure is <130/80 mmHg. In clinic, your blood pressure was 120/67 mmHg.  Medication Changes:  Continue med listed blow 1. Toprol 12.5 mg daily 2. Farxiga 5 mg daily 3. Entresto 24-26 mg BID 4. Spironolactone 12.5 mg MWF 5. Furosemide 20 mg on days he take spiro (MWF) and 40 mg on other days   Monitor blood pressure at home daily and keep a log (on your phone or piece of paper) to bring with you to your next visit. Write down date, time, blood pressure and pulse.  Keep up the good work with diet and exercise. Aim for a diet full of vegetables, fruit and lean meats (chicken, [place], fish). Try to limit salt intake by eating fresh or frozen vegetables (instead of canned), rinse canned vegetables prior to cooking and do not add any additional salt to meals.   Please give [place] a call at [phone] with any questions or concerns.

## 2020-03-12 ENCOUNTER — Telehealth: Payer: Self-pay

## 2020-03-12 NOTE — Progress Notes (Signed)
EPIC Encounter for ICM Monitoring  Patient Name: Austin [name] is a 79 y.o. male Date: 03/12/2020 Primary Care Physican: Lajean Manes, MD Primary Cardiologist:Smith Electrophysiologist: Lovena Le 01/01/2020 OfficeWeight:198-199lbs  Attempted call to patient and unable to reach.   Transmission reviewed.    OptivolThoracic impedancenormal.  Prescribed:Furosemide 20 mgTake 1 tablet by mouth daily. May take an extra tablet as needed for weight gain.  Labs: 01/29/2020 Creatinine 1.26, BUN 25, Potassium 4.3, Sodium 135, GFR 55-63 01/14/2020 Creatinine 1.17, BUN 19, Potassium 4.0, Sodium 140, GFR 60-69  A complete set of results can be found in Results Review.  Recommendations: Unable to reach.    Follow-up plan: ICM clinic phone appointment on9/20/2021. 91 day device clinic remote transmission9/07/2019.   EP/Cardiology Office Visits: 03/24/2020 with Dr. Tamala Julian.    Copy of ICM check sent to Dr. Lovena Le.   3 month ICM trend: 03/10/2020    1 Year ICM trend:       [name], RN 03/12/2020 3:05 PM

## 2020-03-12 NOTE — Telephone Encounter (Signed)
Remote ICM transmission received.  Attempted call to patient regarding ICM remote transmission and left detailed message per DPR.  Advised to return call for any fluid symptoms or questions. Next ICM remote transmission scheduled 04/14/2020.     

## 2020-03-13 ENCOUNTER — Ambulatory Visit: Payer: Medicare Other | Admitting: Podiatry

## 2020-03-13 ENCOUNTER — Other Ambulatory Visit: Payer: Self-pay

## 2020-03-13 ENCOUNTER — Ambulatory Visit (INDEPENDENT_AMBULATORY_CARE_PROVIDER_SITE_OTHER): Payer: Medicare Other | Admitting: *Deleted

## 2020-03-13 DIAGNOSIS — Z5181 Encounter for therapeutic drug level monitoring: Secondary | ICD-10-CM | POA: Diagnosis not present

## 2020-03-13 DIAGNOSIS — T148XXD Other injury of unspecified body region, subsequent encounter: Secondary | ICD-10-CM

## 2020-03-13 DIAGNOSIS — I4891 Unspecified atrial fibrillation: Secondary | ICD-10-CM | POA: Diagnosis not present

## 2020-03-13 DIAGNOSIS — L97521 Non-pressure chronic ulcer of other part of left foot limited to breakdown of skin: Secondary | ICD-10-CM | POA: Diagnosis not present

## 2020-03-13 DIAGNOSIS — I739 Peripheral vascular disease, unspecified: Secondary | ICD-10-CM | POA: Diagnosis not present

## 2020-03-13 LAB — POCT INR: INR: 3.8 — AB (ref 2.0–3.0)

## 2020-03-13 NOTE — Progress Notes (Signed)
  Subjective:  Patient ID: Austin [name], male    DOB: 1941-03-16,  MRN: [id number]  Chief Complaint  Patient presents with  . Foot Ulcer    2 week follow up left     79 y.o. male presents with the above complaint. History confirmed with patient.  He completed his arterial testing.  He states that Dr. Oneida Alar told him that they will just continue to monitor his arterial flow if he can heal the wound and that no intervention was necessary.  Objective:  Physical Exam: blanching with elevation noted, dependent rubor present, DP reduced left, PT reduced left, ulceration at left hallux nail bed and venous stasis dermatitis noted. Left Foot: Wound has nearly completely healed, with some surrounding hyperkeratosis.  Two 2 mm diameter areas have not fully healed.  Assessment:   1. 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 Wound healing, delayed   3. PAD (peripheral artery disease) ([place])      Plan:  Patient was evaluated and treated and all questions answered.  -Wound has made significant healing progress since last visit.  I agree with Dr. Oneida Alar that we should continue to allow this to heal and hopefully we can avoid revascularization. -Continue local wound care with Betadine ointment -Wound was debrided today as below    Procedure: Excisional Debridement of Wound Rationale: Removal of non-viable soft tissue from the wound to promote healing.  Anesthesia: none Pre-Debridement Wound Measurements: 0.2 mm x 0.2 mm x 0.1 mm Post-Debridement Wound Measurements: Same as predebridement Type of Debridement: Sharp selective Tissue Removed: Non-viable soft tissue and hyperkeratosis Depth of Debridement: subcutaneous tissue. Technique: Sharp selective debridement to bleeding, viable wound base.  Dressing: Dry, sterile, compression dressing. Disposition: Patient tolerated procedure well. Patient to return in 1 month for follow-up.  Return in about 1  month (around 04/13/2020) for wound re-check.       Lanae Crumbly, DPM 03/13/2020     Return in about 1 month (around 04/13/2020) for wound re-check.

## 2020-03-21 NOTE — Progress Notes (Signed)
Cardiology Office Note:    Date:  03/24/2020   ID:  Austin [name], DOB 01/07/1941, MRN [id number]  PCP:  Lajean Manes, MD  Cardiologist:  Sinclair Grooms, MD   Referring MD: Lajean Manes, MD   Chief Complaint  Patient presents with  . Congestive Heart Failure  . Shortness of Breath  . Chest Pain    History of Present Illness:    Austin [name] is a 79 y.o. male with a hx of mechanical mitral valve, chronic anticoagulation therapy, nonischemic cardiomyopathy, biventricular implantable cardiac defibrillator, recurrent ventricular tachycardia,amiodarone for VT suppression,multiple medication regimen to suppress ventricular ectopy, and sleep apnea.  Echo performed June 2021 revealed EF of 20 to 54% and systolic heart failure management titrated/added.  He has some chest pain this morning that he felt was musculoskeletal.  Tylenol relieves the discomfort.  Since readjusting his medication regimen, breathing has improved.  He has no orthopnea, PND, lower extremity swelling.  He has some concerns about blood pressures that at times can be as low as 85 mmHg systolic.  Past Medical History:  Diagnosis Date  . AICD (automatic cardioverter/defibrillator) present   . Atrial fibrillation ([place])   . 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 Conductive hearing loss   . Intraspinal abscess   . 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 Nonischemic cardiomyopathy ([place])   . Psychosexual dysfunction with inhibited sexual excitement   . S/P mitral valve replacement   . Syncope and collapse   . Unspecified sleep apnea    last sleep study 11/07  . Ventricular tachycardia ([place])   . VT (ventricular tachycardia) ([place])     Past Surgical History:  Procedure Laterality Date  . BIV ICD GENERATOR CHANGEOUT N/A 05/04/2017   Procedure: BiV ICD Generator Changeout;   Surgeon: Evans Lance, MD;  Location: [place] [facility];  Service: Cardiovascular;  Laterality: N/A;  . CARDIAC CATHETERIZATION  04/24/2002  . CARDIAC VALVE REPLACEMENT    . CARDIOVERSION  03/09/2012   Procedure: CARDIOVERSION;  Surgeon: Evans Lance, MD;  Location: [place];  Service: Cardiovascular;  Laterality: N/A;  . CARDIOVERSION N/A 11/22/2013   Procedure: CARDIOVERSION;  Surgeon: Sanda Klein, MD;  Location: [facility];  Service: Cardiovascular;  Laterality: N/A;  . dental implants    . ELECTROPHYSIOLOGIC STUDY N/A 09/15/2015   Procedure: V Tach Ablation;  Surgeon: Evans Lance, MD;  Location: [place] [facility];  Service: Cardiovascular;  Laterality: N/A;  . Evacution of epidural lumbar epidural abscess  1999  . INSERT / REPLACE / REMOVE PACEMAKER  11/2008  . MITRAL VALVE REPLACEMENT     w #[address] [facility]  . PERMANENT PACEMAKER INSERTION N/A 08/05/2011   Procedure: PERMANENT PACEMAKER INSERTION;  Surgeon: Evans Lance, MD;  Location: [facility] CATH LAB;  Service: Cardiovascular;  Laterality: N/A;  . THYROIDECTOMY    . TONSILLECTOMY    . VALVE REPLACEMENT  2000    Current Medications: Current Meds  Medication Sig  . acetaminophen (TYLENOL) 500 MG tablet Take 1,000 mg by mouth every 6 (six) hours as needed (pain).  Marland Kitchen amiodarone (PACERONE) 200 MG tablet Take one tablet by mouth daily Monday through Friday.  Do NOT take on Saturday or Sunday.  Marland Kitchen amoxicillin (AMOXIL) 500 MG capsule TAKE 4 CAPSULES BY MOUTH 30 MINUTES PRIOR TO DENTAL WORK  . cephALEXin (KEFLEX) 500 MG  capsule Take by mouth.  . dapagliflozin propanediol (FARXIGA) 5 MG TABS tablet Take 1 tablet (5 mg total) by mouth daily before breakfast.  . levothyroxine (SYNTHROID) 50 MCG tablet TAKE 1 TABLET BY MOUTH DAILY BEFORE BREAKFAST  . metoprolol succinate (TOPROL XL) 25 MG 24 hr tablet Take 0.5 tablets (12.5 mg total) by mouth daily.  . Multiple Vitamin (MULTIVITAMIN WITH MINERALS) TABS tablet Take 1 tablet by mouth  daily.  . nitroGLYCERIN (NITROSTAT) 0.4 MG SL tablet Place 1 tablet (0.4 mg total) under the tongue every 5 (five) minutes x 3 doses as needed for chest pain.  . sacubitril-valsartan (ENTRESTO) 24-26 MG Take 1 tablet by mouth 2 (two) times daily.  Marland Kitchen spironolactone (ALDACTONE) 25 MG tablet Take 0.5 tablets (12.5 mg total) by mouth daily. (Patient taking differently: Take 12.5 mg by mouth every Monday, Wednesday, and Friday. )  . warfarin (COUMADIN) 5 MG tablet TAKE AS DIRECTED BY THE COUMADIN CLINIC  . [DISCONTINUED] furosemide (LASIX) 20 MG tablet Take 40 mg by mouth. Take 2 tablets by mouth on the days you do not take spironolactone     Allergies:   Zetia [ezetimibe]   Social History   Socioeconomic History  . Marital status: Married    Spouse name: Not on file  . Number of children: 1  . Years of education: 54  . Highest education level: Master's degree (e.g., MA, MS, MEng, MEd, MSW, MBA)  Occupational History  . Occupation: retired from Engineer, manufacturing systems  Tobacco Use  . Smoking status: Never Smoker  . Smokeless tobacco: Never Used  Vaping Use  . Vaping Use: Never used  Substance and Sexual Activity  . Alcohol use: Yes    Alcohol/week: 0.0 standard drinks    Comment: occasionally  . Drug use: No  . Sexual activity: Not on file  Other Topics Concern  . Not on file  Social History Narrative   Lives with wife in a one story home.  Has one daughter.  Retired.  Education: Masters.   Social Determinants of Health   Financial Resource Strain:   . Difficulty of Paying Living Expenses: Not on file  Food Insecurity:   . Worried About Charity fundraiser in the Last Year: Not on file  . Ran Out of Food in the Last Year: Not on file  Transportation Needs:   . Lack of Transportation (Medical): Not on file  . Lack of Transportation (Non-Medical): Not on file  Physical Activity:   . Days of Exercise per Week: Not on file  . Minutes of Exercise per Session: Not on file  Stress:     . Feeling of Stress : Not on file  Social Connections:   . Frequency of Communication with Friends and Family: Not on file  . Frequency of Social Gatherings with Friends and Family: Not on file  . Attends Religious Services: Not on file  . Active Member of Clubs or Organizations: Not on file  . Attends Archivist Meetings: Not on file  . Marital Status: Not on file     Family History: The patient's family history includes Heart disease in his father and mother; Heart failure in his father and mother.  ROS:   Please see the history of present illness.    Appetite is improved.  Weight is increased.  He denies orthopnea.  All other systems reviewed and are negative.  EKGs/Labs/Other Studies Reviewed:    The following studies were reviewed today: New data  EKG:  EKG ventricular pacing is noted.  Background atrial flutter or fibrillation is felt to be present.  No changes noted when compared to prior.   Recent Labs: 01/14/2020: Hemoglobin 14.3; NT-Pro BNP 1,971; Platelets 161 02/15/2020: BUN 21; Creatinine, Ser 1.28; Potassium 4.4; Sodium 136  Recent Lipid Panel    Component Value Date/Time   CHOL 158 08/23/2015 0543   TRIG 130 08/23/2015 0543   HDL 50 08/23/2015 0543   CHOLHDL 3.2 08/23/2015 0543   VLDL 26 08/23/2015 0543   LDLCALC 82 08/23/2015 0543    Physical Exam:    VS:  BP (!) 90/58   Pulse 72   Ht 6' 1.5" (1.867 m)   Wt 203 lb 12.8 oz (92.4 kg)   SpO2 98%   BMI 26.52 kg/m     Wt Readings from Last 3 Encounters:  03/24/20 203 lb 12.8 oz (92.4 kg)  03/11/20 204 lb 6.4 oz (92.7 kg)  03/11/20 204 lb 6.4 oz (92.7 kg)     GEN: Healthy-appearing. No acute distress HEENT: Normal NECK: No JVD. LYMPHATICS: No lymphadenopathy CARDIAC:  RRR without murmur, gallop, or edema. VASCULAR:  Normal Pulses. No bruits. RESPIRATORY:  Clear to auscultation without rales, wheezing or rhonchi  ABDOMEN: Soft, non-tender, non-distended, No pulsatile  mass, MUSCULOSKELETAL: No deformity  SKIN: Warm and dry NEUROLOGIC:  Alert and oriented x 3 PSYCHIATRIC:  Normal affect   ASSESSMENT:    1. Chronic systolic heart failure ([place])   2. Atrial fibrillation, unspecified type ([place])   3. Encounter for therapeutic drug monitoring   4. VT (ventricular tachycardia) ([place])   5. ICD (implantable cardioverter-defibrillator) in place   6. Automatic implantable cardioverter-defibrillator in situ   7. S/P mitral valve replacement   8. Obstructive sleep apnea   9. Educated about COVID-19 virus infection    PLAN:    In order of problems listed above:  1. He is on guideline directed therapy that includes spironolactone, Farxiga, metoprolol succinate, and Entresto.  2D Doppler echocardiogram in 3 months prior to the office visit.  Basic metabolic panel in 2 weeks.  Have decided to decrease furosemide to 20 mg daily. 2. Unchanged 3. Not discussed 4. Not discussed 5. No treatment/shocks 6. No murmur 7. He sees Dr. Maxwell Caul tomorrow for sleep apnea adjustment 8. Vaccinated and Metallurgist.  Guideline directed therapy for left ventricular systolic dysfunction: Angiotensin receptor-neprilysin inhibitor (ARNI)-Entresto; beta-blocker therapy - carvedilol, metoprolol succinate, or bisoprolol; mineralocorticoid receptor antagonist (MRA) therapy -spironolactone or eplerenone.  SGLT-2 agents -  Dapagliflozin [place]) or Empagliflozin ([place]).These therapies have been shown to improve clinical outcomes including reduction of rehospitalization, survival, and acute heart failure.  2D Doppler echocardiogram will be performed prior to the next office visit in 3 months.  Blood work needs to be done in 2 weeks.   Medication Adjustments/Labs and Tests Ordered: Current medicines are reviewed at length with the patient today.  Concerns regarding medicines are outlined above.  Orders Placed This Encounter  Procedures  . Basic metabolic panel  .  EKG 12-Lead  . ECHOCARDIOGRAM COMPLETE   Meds ordered this encounter  Medications  . furosemide (LASIX) 20 MG tablet    Sig: Take 1 tablet (20 mg total) by mouth daily.    Dispense:  90 tablet    Refill:  3    Patient Instructions  Medication Instructions:  1) CHANGE Furosemide to 20mg  once daily  *If you need a refill on your cardiac medications before your next appointment, please call your pharmacy*  Lab Work: BMET in 2 weeks  If you have labs (blood work) drawn today and your tests are completely normal, you will receive your results only by: Marland Kitchen MyChart Message (if you have MyChart) OR . A paper copy in the mail If you have any lab test that is abnormal or we need to change your treatment, we will call you to review the results.   Testing/Procedures: Your physician has requested that you have an echocardiogram a week prior to seeing Dr. Tamala Julian back. Echocardiography is a painless test that uses sound waves to create images of your heart. It provides your doctor with information about the size and shape of your heart and how well your heart's chambers and valves are working. This procedure takes approximately one hour. There are no restrictions for this procedure.     Follow-Up: At [facility], you and your health needs are our priority.  As part of our continuing mission to provide you with exceptional heart care, we have created designated Provider Care Teams.  These Care Teams include your primary Cardiologist (physician) and Advanced Practice Providers (APPs -  Physician Assistants and Nurse Practitioners) who all work together to provide you with the care you need, when you need it.  We recommend signing up for the patient portal called "MyChart".  Sign up information is provided on this After Visit Summary.  MyChart is used to connect with patients for Virtual Visits (Telemedicine).  Patients are able to view lab/test results, encounter notes, upcoming appointments,  etc.  Non-urgent messages can be sent to your provider as well.   To learn more about what you can do with MyChart, go to [email or web address].    Your next appointment:   3 month(s)  The format for your next appointment:   In Person  Provider:   You may see Sinclair Grooms, MD or one of the following Advanced Practice Providers on your designated Care Team:    Truitt Merle, NP  Cecilie Kicks, NP  Kathyrn Drown, NP    Other Instructions      Signed, Sinclair Grooms, MD  03/24/2020 10:59 AM    [place]

## 2020-03-24 ENCOUNTER — Other Ambulatory Visit: Payer: Self-pay | Admitting: *Deleted

## 2020-03-24 ENCOUNTER — Other Ambulatory Visit: Payer: Self-pay

## 2020-03-24 ENCOUNTER — Ambulatory Visit: Payer: Medicare Other | Admitting: Interventional Cardiology

## 2020-03-24 ENCOUNTER — Encounter: Payer: Self-pay | Admitting: Interventional Cardiology

## 2020-03-24 VITALS — BP 90/58 | HR 72 | Ht 73.5 in | Wt 203.8 lb

## 2020-03-24 DIAGNOSIS — I472 Ventricular tachycardia, unspecified: Secondary | ICD-10-CM

## 2020-03-24 DIAGNOSIS — I4891 Unspecified atrial fibrillation: Secondary | ICD-10-CM | POA: Diagnosis not present

## 2020-03-24 DIAGNOSIS — Z7189 Other specified counseling: Secondary | ICD-10-CM

## 2020-03-24 DIAGNOSIS — Z5181 Encounter for therapeutic drug level monitoring: Secondary | ICD-10-CM

## 2020-03-24 DIAGNOSIS — G4733 Obstructive sleep apnea (adult) (pediatric): Secondary | ICD-10-CM

## 2020-03-24 DIAGNOSIS — I5022 Chronic systolic (congestive) heart failure: Secondary | ICD-10-CM

## 2020-03-24 DIAGNOSIS — Z95810 Presence of automatic (implantable) cardiac defibrillator: Secondary | ICD-10-CM

## 2020-03-24 DIAGNOSIS — Z952 Presence of prosthetic heart valve: Secondary | ICD-10-CM

## 2020-03-24 MED ORDER — AMOXICILLIN 500 MG PO CAPS: ORAL_CAPSULE | ORAL | 2 refills | Status: DC

## 2020-03-24 MED ORDER — FUROSEMIDE 20 MG PO TABS
20.0000 mg | ORAL_TABLET | Freq: Every day | ORAL | 3 refills | Status: DC
Start: 2020-03-24 — End: 2020-06-24

## 2020-03-24 NOTE — Patient Instructions (Signed)
Medication Instructions:  1) CHANGE Furosemide to 20mg  once daily  *If you need a refill on your cardiac medications before your next appointment, please call your pharmacy*   Lab Work: BMET in 2 weeks  If you have labs (blood work) drawn today and your tests are completely normal, you will receive your results only by: Marland Kitchen MyChart Message (if you have MyChart) OR . A paper copy in the mail If you have any lab test that is abnormal or we need to change your treatment, we will call you to review the results.   Testing/Procedures: Your physician has requested that you have an echocardiogram a week prior to seeing Dr. Tamala Julian back. Echocardiography is a painless test that uses sound waves to create images of your heart. It provides your doctor with information about the size and shape of your heart and how well your heart's chambers and valves are working. This procedure takes approximately one hour. There are no restrictions for this procedure.     Follow-Up: At [facility], you and your health needs are our priority.  As part of our continuing mission to provide you with exceptional heart care, we have created designated Provider Care Teams.  These Care Teams include your primary Cardiologist (physician) and Advanced Practice Providers (APPs -  Physician Assistants and Nurse Practitioners) who all work together to provide you with the care you need, when you need it.  We recommend signing up for the patient portal called "MyChart".  Sign up information is provided on this After Visit Summary.  MyChart is used to connect with patients for Virtual Visits (Telemedicine).  Patients are able to view lab/test results, encounter notes, upcoming appointments, etc.  Non-urgent messages can be sent to your provider as well.   To learn more about what you can do with MyChart, go to [email or web address].    Your next appointment:   3 month(s)  The format for your next appointment:   In  Person  Provider:   You may see Sinclair Grooms, MD or one of the following Advanced Practice Providers on your designated Care Team:    Truitt Merle, NP  Cecilie Kicks, NP  Kathyrn Drown, NP    Other Instructions

## 2020-03-26 ENCOUNTER — Ambulatory Visit (INDEPENDENT_AMBULATORY_CARE_PROVIDER_SITE_OTHER): Payer: Medicare Other | Admitting: *Deleted

## 2020-03-26 DIAGNOSIS — I472 Ventricular tachycardia, unspecified: Secondary | ICD-10-CM

## 2020-03-27 ENCOUNTER — Ambulatory Visit (INDEPENDENT_AMBULATORY_CARE_PROVIDER_SITE_OTHER): Payer: Medicare Other | Admitting: *Deleted

## 2020-03-27 DIAGNOSIS — Z5181 Encounter for therapeutic drug level monitoring: Secondary | ICD-10-CM | POA: Diagnosis not present

## 2020-03-27 DIAGNOSIS — I4891 Unspecified atrial fibrillation: Secondary | ICD-10-CM

## 2020-03-27 LAB — POCT INR: INR: 2.1 (ref 2.0–3.0)

## 2020-03-28 LAB — CUP PACEART REMOTE DEVICE CHECK
Battery Remaining Longevity: 91 mo
Battery Voltage: 2.98 V
Brady Statistic AP VP Percent: 0.03 %
Brady Statistic AP VS Percent: 0 %
Brady Statistic AS VP Percent: 85.98 %
Brady Statistic AS VS Percent: 13.99 %
Brady Statistic RA Percent Paced: 0.02 %
Brady Statistic RV Percent Paced: 86.65 %
Date Time Interrogation Session: 20210901033326
HighPow Impedance: 77 Ohm
Implantable Lead Implant Date: 20130110
Implantable Lead Implant Date: 20130110
Implantable Lead Location: 753859
Implantable Lead Location: 753860
Implantable Lead Model: 5076
Implantable Lead Model: 6935
Implantable Pulse Generator Implant Date: 20181010
Lead Channel Impedance Value: 304 Ohm
Lead Channel Impedance Value: 399 Ohm
Lead Channel Impedance Value: 418 Ohm
Lead Channel Pacing Threshold Amplitude: 1 V
Lead Channel Pacing Threshold Pulse Width: 0.4 ms
Lead Channel Sensing Intrinsic Amplitude: 1.5 mV
Lead Channel Sensing Intrinsic Amplitude: 1.5 mV
Lead Channel Sensing Intrinsic Amplitude: 14 mV
Lead Channel Sensing Intrinsic Amplitude: 14 mV
Lead Channel Setting Pacing Amplitude: 2.5 V
Lead Channel Setting Pacing Pulse Width: 0.4 ms
Lead Channel Setting Sensing Sensitivity: 0.3 mV

## 2020-03-28 NOTE — Progress Notes (Signed)
Remote ICD transmission.   

## 2020-04-03 ENCOUNTER — Ambulatory Visit (INDEPENDENT_AMBULATORY_CARE_PROVIDER_SITE_OTHER): Payer: Medicare Other | Admitting: *Deleted

## 2020-04-03 DIAGNOSIS — Z5181 Encounter for therapeutic drug level monitoring: Secondary | ICD-10-CM | POA: Diagnosis not present

## 2020-04-03 DIAGNOSIS — I4891 Unspecified atrial fibrillation: Secondary | ICD-10-CM

## 2020-04-03 LAB — POCT INR: INR: 2.9 (ref 2.0–3.0)

## 2020-04-03 LAB — PROTIME-INR

## 2020-04-03 NOTE — Patient Instructions (Signed)
Description   Spoke with pt and advised him to take 1 tablet today then start taking 1 tablet daily except 1/2 tablet on Sunday, Tuesday, Thursday. Recheck INR in 2 weeks.

## 2020-04-08 ENCOUNTER — Ambulatory Visit: Payer: Medicare Other

## 2020-04-08 ENCOUNTER — Other Ambulatory Visit: Payer: Self-pay

## 2020-04-08 ENCOUNTER — Other Ambulatory Visit: Payer: Medicare Other

## 2020-04-08 DIAGNOSIS — I5022 Chronic systolic (congestive) heart failure: Secondary | ICD-10-CM

## 2020-04-08 LAB — BASIC METABOLIC PANEL
BUN/Creatinine Ratio: 18 (ref 10–24)
BUN: 22 mg/dL (ref 8–27)
CO2: 20 mmol/L (ref 20–29)
Calcium: 8.7 mg/dL (ref 8.6–10.2)
Chloride: 103 mmol/L (ref 96–106)
Creatinine, Ser: 1.19 mg/dL (ref 0.76–1.27)
GFR calc Af Amer: 68 mL/min/{1.73_m2} (ref >59)
GFR calc non Af Amer: 59 mL/min/{1.73_m2} — ABNORMAL LOW (ref >59)
Glucose: 94 mg/dL (ref 65–99)
Potassium: 4.2 mmol/L (ref 3.5–5.2)
Sodium: 136 mmol/L (ref 134–144)

## 2020-04-14 ENCOUNTER — Other Ambulatory Visit: Payer: Self-pay

## 2020-04-14 ENCOUNTER — Ambulatory Visit (INDEPENDENT_AMBULATORY_CARE_PROVIDER_SITE_OTHER): Payer: Medicare Other | Admitting: Podiatry

## 2020-04-14 ENCOUNTER — Ambulatory Visit (INDEPENDENT_AMBULATORY_CARE_PROVIDER_SITE_OTHER): Payer: Medicare Other

## 2020-04-14 DIAGNOSIS — I5022 Chronic systolic (congestive) heart failure: Secondary | ICD-10-CM

## 2020-04-14 DIAGNOSIS — I739 Peripheral vascular disease, unspecified: Secondary | ICD-10-CM | POA: Diagnosis not present

## 2020-04-14 DIAGNOSIS — L97521 Non-pressure chronic ulcer of other part of left foot limited to breakdown of skin: Secondary | ICD-10-CM

## 2020-04-14 DIAGNOSIS — Z95810 Presence of automatic (implantable) cardiac defibrillator: Secondary | ICD-10-CM | POA: Diagnosis not present

## 2020-04-14 DIAGNOSIS — L97522 Non-pressure chronic ulcer of other part of left foot with fat layer exposed: Secondary | ICD-10-CM | POA: Diagnosis not present

## 2020-04-14 DIAGNOSIS — T148XXD Other injury of unspecified body region, subsequent encounter: Secondary | ICD-10-CM

## 2020-04-14 NOTE — Progress Notes (Signed)
  Subjective:  Patient ID: Austin [name], male    DOB: 1940/08/17,  MRN: [id number]  Chief Complaint  Patient presents with  . Follow-up    Left foot great toe nail removal check, no concerned voiced by patient    79 y.o. male presents with the above complaint. History confirmed with patient.  He thinks the wound has healed  Objective:  Physical Exam: blanching with elevation noted, dependent rubor present, DP reduced left, PT reduced left, healed ulceration at left hallux nail bed and venous stasis dermatitis noted. Left Foot: Wound has completely healed with epithelialization of the nail bed  Assessment:   1. Wound healing, delayed   2. 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 PAD (peripheral artery disease) ([place])   4. 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  -Wound has healed completely. -Return if this recurs or further issues or questions   Lanae Crumbly, DPM 04/14/2020     No follow-ups on file.

## 2020-04-17 ENCOUNTER — Other Ambulatory Visit: Payer: Self-pay

## 2020-04-17 ENCOUNTER — Ambulatory Visit: Payer: Medicare Other | Admitting: Vascular Surgery

## 2020-04-17 ENCOUNTER — Ambulatory Visit (INDEPENDENT_AMBULATORY_CARE_PROVIDER_SITE_OTHER): Payer: Medicare Other | Admitting: *Deleted

## 2020-04-17 ENCOUNTER — Encounter: Payer: Self-pay | Admitting: Vascular Surgery

## 2020-04-17 ENCOUNTER — Ambulatory Visit: Payer: Medicare Other | Admitting: Podiatry

## 2020-04-17 VITALS — BP 113/81 | HR 70 | Temp 97.5°F | Resp 20 | Ht 73.5 in | Wt 203.0 lb

## 2020-04-17 DIAGNOSIS — I4891 Unspecified atrial fibrillation: Secondary | ICD-10-CM

## 2020-04-17 DIAGNOSIS — I739 Peripheral vascular disease, unspecified: Secondary | ICD-10-CM | POA: Diagnosis not present

## 2020-04-17 DIAGNOSIS — Z5181 Encounter for therapeutic drug level monitoring: Secondary | ICD-10-CM | POA: Diagnosis not present

## 2020-04-17 LAB — POCT INR: INR: 2.9 (ref 2.0–3.0)

## 2020-04-17 NOTE — Progress Notes (Signed)
Patient is a 79 year old male who returns for follow-up today.  He was last seen March 06, 2020.  At that point he had an ulcer at the bed of the left first toenail.  He states that the wound has now completely healed.  Physical exam:  Vitals:   04/17/20 1136  BP: 113/81  Pulse: 70  Resp: 20  Temp: (!) 97.5 F (36.4 C)  SpO2: 95%  Weight: 203 lb (92.1 kg)  Height: 6' 1.5" (1.867 m)    Extremities: No palpable pedal pulses well-healed wound left foot  Skin: Bilateral hemosiderin staining  I reviewed his prior noninvasive studies which again showed calcified vessels monophasic tibial flow  Assessment: Patient with tibial occlusive disease but has been able to heal wound spontaneously on the left foot.  I do not believe any intervention is necessary for now.  We will see him intermittently in follow-up.  He will call us sooner if he develops a new wound or develops rest pain or severe claudication in his leg.  Since his duplex showed primarily tibial disease most likely would only consider arteriogram if he is in a limb threatening situation rather than for claudication symptoms alone.  Plan: The patient will follow up in 6 months time with repeat ABIs in our APP clinic.  Ruta Hinds, MD [facility] Office: [phone]

## 2020-04-17 NOTE — Patient Instructions (Addendum)
Description   Spoke with pt and advised him to take 1 tablet today then start taking 1 tablet daily except 1/2 tablet on Sundays and Thursdays. Recheck INR in 2 weeks.      

## 2020-04-18 ENCOUNTER — Telehealth: Payer: Self-pay

## 2020-04-18 NOTE — Telephone Encounter (Signed)
Patient made contact with the office concerned the increase of AF since 03/26/20. States he hasn't experienced it in a while. AF burden ~  39.3%, appears A-flutter w/ controlled VR. Per med list, Amiodarone 200 mg daily (Monday-Friday), Toprol XL 12.5 mg daily, Coumadin 5 mg (taken as directed by coumadin clinic).   Patient reports of feeling lightheaded and nausea at times, almost daily. States this has been going on for a while. Denies symptoms at this time. Compliant with medications.   Advised patient I will forward to AF Clinic as well as Dr. Lovena Le. Patient verbalizes understanding and agreeable to plan.   ED precautions given.

## 2020-04-18 NOTE — Progress Notes (Signed)
EPIC Encounter for ICM Monitoring  Patient Name: Austin [name] is a 79 y.o. male Date: 04/18/2020 Primary Care Physican: Lajean Manes, MD Primary Cardiologist:Smith Electrophysiologist: Lovena Le 9/24/2021Weight:198-199lbs  VT-NS (>4 beats, >176 bpm)  3  Clinical Status (26-Mar-2020 to 16-Apr-2020) AT/AF 582 episodes  Time in AT/AF 9.4 hr/day (39.3%) (taking coumadin) AT/AF >= 6 hr for 18 days   Spoke with patient and reports feeling well at this time.  Denies fluid symptoms.  Advised he may receive call from device clinic since the report is suggesting a change in his rhythm.    OptivolThoracic impedancenormal. Report suggests patient in Afib since 03/24/2020.  Message sent to device clinic triage for review.  Dr Tanna Furry 03/11/2020 OV note says patient has Atrial Flutter but rates are well controlled at that time.   Prescribed:Furosemide 20 mgTake 1 tablet by mouth daily. May take an extra tablet as needed for weight gain.  Labs: 01/29/2020 Jimmy Footman, Potassium4.3, [id number], [id number] 01/14/2020 Creatinine1.17, BUN19, Potassium4.0, [id number], [id number]  Acomplete set of results can be found in Results Review.  Recommendations: No changes and encouraged to call if experiencing any fluid symptoms.  Follow-up plan: ICM clinic phone appointment on10/25/2021. 91 day device clinic remote transmission12/07/2019.   EP/Cardiology Office Visits:06/24/2020 with Dr. Tamala Julian. 07/22/2020 with Dr Lovena Le  Copy of ICM check sent to Dr.Taylor.   3 month ICM trend: 04/17/2020    1 Year ICM trend:       [name], RN 04/18/2020 1:39 PM

## 2020-04-22 ENCOUNTER — Other Ambulatory Visit: Payer: Self-pay | Admitting: *Deleted

## 2020-04-22 DIAGNOSIS — I739 Peripheral vascular disease, unspecified: Secondary | ICD-10-CM

## 2020-04-23 NOTE — Telephone Encounter (Signed)
Per Dr. Anders Grant current medication regimen.  No changes.

## 2020-04-27 ENCOUNTER — Other Ambulatory Visit: Payer: Self-pay | Admitting: Interventional Cardiology

## 2020-04-30 ENCOUNTER — Other Ambulatory Visit: Payer: Self-pay

## 2020-04-30 ENCOUNTER — Other Ambulatory Visit: Payer: Medicare Other | Admitting: *Deleted

## 2020-04-30 ENCOUNTER — Other Ambulatory Visit: Payer: Self-pay | Admitting: *Deleted

## 2020-04-30 DIAGNOSIS — I5022 Chronic systolic (congestive) heart failure: Secondary | ICD-10-CM

## 2020-04-30 LAB — BASIC METABOLIC PANEL
BUN/Creatinine Ratio: 17 (ref 10–24)
BUN: 23 mg/dL (ref 8–27)
CO2: 22 mmol/L (ref 20–29)
Calcium: 9.2 mg/dL (ref 8.6–10.2)
Chloride: 99 mmol/L (ref 96–106)
Creatinine, Ser: 1.39 mg/dL — ABNORMAL HIGH (ref 0.76–1.27)
GFR calc Af Amer: 56 mL/min/{1.73_m2} — ABNORMAL LOW (ref >59)
GFR calc non Af Amer: 49 mL/min/{1.73_m2} — ABNORMAL LOW (ref >59)
Glucose: 91 mg/dL (ref 65–99)
Potassium: 4.5 mmol/L (ref 3.5–5.2)
Sodium: 137 mmol/L (ref 134–144)

## 2020-05-01 ENCOUNTER — Ambulatory Visit (INDEPENDENT_AMBULATORY_CARE_PROVIDER_SITE_OTHER): Payer: Medicare Other | Admitting: Interventional Cardiology

## 2020-05-01 DIAGNOSIS — Z5181 Encounter for therapeutic drug level monitoring: Secondary | ICD-10-CM

## 2020-05-01 LAB — POCT INR: INR: 2.8 (ref 2.0–3.0)

## 2020-05-01 NOTE — Patient Instructions (Signed)
Description   Spoke with pt and advised him to take 1 tablet today then start taking 1 tablet daily except 1/2 tablet on Sundays and Thursdays. Recheck INR in 2 weeks.      

## 2020-05-12 ENCOUNTER — Other Ambulatory Visit: Payer: Self-pay | Admitting: Internal Medicine

## 2020-05-13 NOTE — Telephone Encounter (Signed)
Pt's pharmacy is requesting a refill on levothyroxine. Would Dr. Taylor like to refill this medication? Please address °

## 2020-05-15 ENCOUNTER — Ambulatory Visit (INDEPENDENT_AMBULATORY_CARE_PROVIDER_SITE_OTHER): Payer: Medicare Other | Admitting: Internal Medicine

## 2020-05-15 DIAGNOSIS — Z5181 Encounter for therapeutic drug level monitoring: Secondary | ICD-10-CM

## 2020-05-15 LAB — POCT INR: INR: 4.6 — AB (ref 2.0–3.0)

## 2020-05-15 NOTE — Patient Instructions (Signed)
Description   Spoke with pt and advised him to take 1 tablet today then start taking 1 tablet daily except 1/2 tablet on Sundays and Thursdays. Recheck INR in 2 weeks.

## 2020-05-16 ENCOUNTER — Telehealth: Payer: Self-pay | Admitting: *Deleted

## 2020-05-16 NOTE — Telephone Encounter (Signed)
Fax was received yesterday and patient was called

## 2020-05-16 NOTE — Telephone Encounter (Signed)
Received call transferred from operator from [facility] remote INR.  They are calling to report INR yesterday of 4.6.  Chart reviewed and this was addressed by coumadin clinic yesterday.  Caller from [facility] verified they received call back.  Austin [name] is now calling to see if updated fax has been received.  If not received can contact them at [phone] to resend fax.

## 2020-05-19 ENCOUNTER — Ambulatory Visit (INDEPENDENT_AMBULATORY_CARE_PROVIDER_SITE_OTHER): Payer: Medicare Other

## 2020-05-19 DIAGNOSIS — I5022 Chronic systolic (congestive) heart failure: Secondary | ICD-10-CM

## 2020-05-19 DIAGNOSIS — Z95810 Presence of automatic (implantable) cardiac defibrillator: Secondary | ICD-10-CM | POA: Diagnosis not present

## 2020-05-23 ENCOUNTER — Telehealth: Payer: Self-pay

## 2020-05-23 NOTE — Progress Notes (Signed)
EPIC Encounter for ICM Monitoring  Patient Name: Austin [name] is a 79 y.o. male Date: 05/23/2020 Primary Care Physican: Lajean Manes, MD Primary Cardiologist:Smith Electrophysiologist: Lovena Le 9/24[birth year]20[birth year]Weight:198-199lbs  VT-NS (>4 beats, >176 bpm)  1  Clinical Status (16-Apr-2020 to 19-May-2020) AT/AF   1059 episodes     Time in AT/AF  9.6 hr/day (39.8%) (taking coumadin) AT/AF >= 6 hr for 32 days. Longest AT/AF 3 hours   Attempted call to patient and unable to reach.  Left detailed message per DPR regarding transmission. Transmission reviewed.     OptivolThoracic impedancenormal.   Prescribed:Furosemide 20 mgTake 1 tablet by mouth daily. May take an extra tablet as needed for weight gain.  Labs: 04/30/2020 Creatinine 1.39, BUN 23, Potassium 4.5, Sodium 137, GFR 49-56 04/08/2020 Creatinine 1.19, BUN 22, Potassium 4.2, Sodium 136, GFR 59-68  02/15/2020 Creatinine 1.28, BUN 21, Potassium 4.4, Sodium 136, GFR 54-62  01/29/2020 Creatinine1.26, BUN25, Potassium4.3, Sodium135, [id number] 01/14/2020 Creatinine1.17, BUN19, Potassium4.0, Sodium140, QZR00-76  Acomplete set of results can be found in Results Review.  Recommendations: Left voice mail with ICM number and encouraged to call if experiencing any fluid symptoms.  Follow-up plan: ICM clinic phone appointment on11/29/2021. 91 day device clinic remote transmission12/07/2019.   EP/Cardiology Office Visits:06/24/2020 with Dr.Smith. 07/22/2020 with Dr Lovena Le  Copy of ICM check sent to Dr.Taylor.   3 month ICM trend: 05/19/2020    1 Year ICM trend:       [name], RN 05/23/2020 11:45 AM

## 2020-05-23 NOTE — Telephone Encounter (Signed)
Remote ICM transmission received.  Attempted call to patient regarding ICM remote transmission and left detailed message per DPR.  Advised to return call for any fluid symptoms or questions. Next ICM remote transmission scheduled 06/23/2020.

## 2020-05-29 ENCOUNTER — Ambulatory Visit (INDEPENDENT_AMBULATORY_CARE_PROVIDER_SITE_OTHER): Payer: Medicare Other

## 2020-05-29 DIAGNOSIS — Z5181 Encounter for therapeutic drug level monitoring: Secondary | ICD-10-CM | POA: Diagnosis not present

## 2020-05-29 DIAGNOSIS — I4891 Unspecified atrial fibrillation: Secondary | ICD-10-CM | POA: Diagnosis not present

## 2020-05-29 LAB — POCT INR: INR: 3.1 — AB (ref 2.0–3.0)

## 2020-05-29 NOTE — Patient Instructions (Addendum)
  Description   Spoke with pt and advised him to continue taking 1 tablet daily except 1/2 tablet on Sundays and Thursdays. Recheck INR in 2 weeks.

## 2020-06-03 ENCOUNTER — Other Ambulatory Visit (HOSPITAL_COMMUNITY): Payer: Self-pay | Admitting: Ophthalmology

## 2020-06-03 DIAGNOSIS — I6523 Occlusion and stenosis of bilateral carotid arteries: Secondary | ICD-10-CM

## 2020-06-05 ENCOUNTER — Ambulatory Visit (HOSPITAL_COMMUNITY)
Admission: RE | Admit: 2020-06-05 | Discharge: 2020-06-05 | Disposition: A | Discharge status: Home / Self Care | Payer: Medicare Other | Source: Ambulatory Visit | Attending: Cardiology | Admitting: Cardiology

## 2020-06-05 DIAGNOSIS — I6523 Occlusion and stenosis of bilateral carotid arteries: Secondary | ICD-10-CM | POA: Diagnosis not present

## 2020-06-12 ENCOUNTER — Ambulatory Visit (INDEPENDENT_AMBULATORY_CARE_PROVIDER_SITE_OTHER): Payer: Medicare Other | Admitting: Cardiology

## 2020-06-12 DIAGNOSIS — Z5181 Encounter for therapeutic drug level monitoring: Secondary | ICD-10-CM | POA: Diagnosis not present

## 2020-06-12 LAB — POCT INR: INR: 3.1 — AB (ref 2.0–3.0)

## 2020-06-12 NOTE — Patient Instructions (Signed)
Description   Spoke with pt and advised him to continue taking 1 tablet daily except 1/2 tablet on Sundays and Thursdays. Recheck INR in 2 weeks.

## 2020-06-18 ENCOUNTER — Other Ambulatory Visit: Payer: Self-pay

## 2020-06-18 ENCOUNTER — Ambulatory Visit (HOSPITAL_COMMUNITY): Payer: Medicare Other | Attending: Cardiology

## 2020-06-18 DIAGNOSIS — I5022 Chronic systolic (congestive) heart failure: Secondary | ICD-10-CM | POA: Insufficient documentation

## 2020-06-18 LAB — ECHOCARDIOGRAM COMPLETE
Area-P 1/2: 3.28 cm2
S' Lateral: 5.2 cm

## 2020-06-18 MED ORDER — PERFLUTREN LIPID MICROSPHERE
1.0000 mL | INTRAVENOUS | Status: AC | PRN
  Administered 2020-06-18: 1 mL via INTRAVENOUS

## 2020-06-19 ENCOUNTER — Other Ambulatory Visit: Payer: Self-pay

## 2020-06-19 ENCOUNTER — Emergency Department (HOSPITAL_COMMUNITY)
Admission: EM | Admit: 2020-06-19 | Discharge: 2020-06-19 | Disposition: A | Discharge status: Home / Self Care | Payer: Medicare Other | Attending: Emergency Medicine | Admitting: Emergency Medicine

## 2020-06-19 ENCOUNTER — Emergency Department (HOSPITAL_COMMUNITY): Payer: Medicare Other

## 2020-06-19 DIAGNOSIS — R0602 Shortness of breath: Secondary | ICD-10-CM | POA: Diagnosis not present

## 2020-06-19 DIAGNOSIS — R079 Chest pain, unspecified: Secondary | ICD-10-CM | POA: Diagnosis present

## 2020-06-19 DIAGNOSIS — Z7901 Long term (current) use of anticoagulants: Secondary | ICD-10-CM | POA: Insufficient documentation

## 2020-06-19 DIAGNOSIS — I472 Ventricular tachycardia, unspecified: Secondary | ICD-10-CM

## 2020-06-19 DIAGNOSIS — I4891 Unspecified atrial fibrillation: Secondary | ICD-10-CM | POA: Insufficient documentation

## 2020-06-19 LAB — CBC WITH DIFFERENTIAL/PLATELET
Abs Immature Granulocytes: 0.06 10*3/uL (ref 0.00–0.07)
Basophils Absolute: 0.1 10*3/uL (ref 0.0–0.1)
Basophils Relative: 1 %
Eosinophils Absolute: 0.1 10*3/uL (ref 0.0–0.5)
Eosinophils Relative: 1 %
HCT: 48.9 % (ref 39.0–52.0)
Hemoglobin: 15.8 g/dL (ref 13.0–17.0)
Immature Granulocytes: 1 %
Lymphocytes Relative: 9 %
Lymphs Abs: 0.8 10*3/uL (ref 0.7–4.0)
MCH: 30.5 pg (ref 26.0–34.0)
MCHC: 32.3 g/dL (ref 30.0–36.0)
MCV: 94.4 fL (ref 80.0–100.0)
Monocytes Absolute: 0.9 10*3/uL (ref 0.1–1.0)
Monocytes Relative: 11 %
Neutro Abs: 6.4 10*3/uL (ref 1.7–7.7)
Neutrophils Relative %: 77 %
Platelets: 154 10*3/uL (ref 150–400)
RBC: 5.18 MIL/uL (ref 4.22–5.81)
RDW: 15.8 % — ABNORMAL HIGH (ref 11.5–15.5)
WBC: 8.3 10*3/uL (ref 4.0–10.5)
nRBC: 0 % (ref 0.0–0.2)

## 2020-06-19 LAB — BASIC METABOLIC PANEL
Anion gap: 11 (ref 5–15)
BUN: 30 mg/dL — ABNORMAL HIGH (ref 8–23)
CO2: 19 mmol/L — ABNORMAL LOW (ref 22–32)
Calcium: 8 mg/dL — ABNORMAL LOW (ref 8.9–10.3)
Chloride: 105 mmol/L (ref 98–111)
Creatinine, Ser: 1.45 mg/dL — ABNORMAL HIGH (ref 0.61–1.24)
GFR, Estimated: 50 mL/min — ABNORMAL LOW (ref >60)
Glucose, Bld: 107 mg/dL — ABNORMAL HIGH (ref 70–99)
Potassium: 5 mmol/L (ref 3.5–5.1)
Sodium: 135 mmol/L (ref 135–145)

## 2020-06-19 LAB — PROTIME-INR
INR: 3.2 — ABNORMAL HIGH (ref 0.8–1.2)
Prothrombin Time: 32 seconds — ABNORMAL HIGH (ref 11.4–15.2)

## 2020-06-19 LAB — BRAIN NATRIURETIC PEPTIDE
B Natriuretic Peptide: 804.8 pg/mL — ABNORMAL HIGH (ref 0.0–100.0)
B Natriuretic Peptide: 600.9 pg/mL — ABNORMAL HIGH (ref 0.0–100.0)

## 2020-06-19 LAB — TROPONIN I (HIGH SENSITIVITY): Troponin I (High Sensitivity): 47 ng/L — ABNORMAL HIGH (ref <18)

## 2020-06-19 MED ORDER — PROPOFOL 10 MG/ML IV BOLUS
INTRAVENOUS | Status: AC | PRN
  Administered 2020-06-19: 50 mg via INTRAVENOUS

## 2020-06-19 MED ORDER — FENTANYL CITRATE (PF) 100 MCG/2ML IJ SOLN
50.0000 ug | Freq: Once | INTRAMUSCULAR | Status: DC
  Filled 2020-06-19: qty 2

## 2020-06-19 MED ORDER — FENTANYL CITRATE (PF) 100 MCG/2ML IJ SOLN
INTRAMUSCULAR | Status: AC | PRN
Start: 2020-06-19 — End: 2020-06-19
  Administered 2020-06-19: 50 ug via INTRAVENOUS

## 2020-06-19 MED ORDER — SODIUM CHLORIDE 0.9 % IV SOLN
INTRAVENOUS | Status: AC | PRN
  Administered 2020-06-19: 10 mL/h via INTRAVENOUS

## 2020-06-19 MED ORDER — PROPOFOL 10 MG/ML IV BOLUS
0.5000 mg/kg | Freq: Once | INTRAVENOUS | Status: DC
  Filled 2020-06-19: qty 20

## 2020-06-19 MED ORDER — AMIODARONE HCL 200 MG PO TABS: ORAL_TABLET | ORAL | 3 refills | Status: DC

## 2020-06-19 NOTE — ED Notes (Signed)
Zoll defib pads placed on pt

## 2020-06-19 NOTE — Discharge Instructions (Signed)
Please take two of your Amiodarone tablets (400mg  total) every day, including weekends, until you are able to see Dr. Lovena Le in follow up. Return to the ER if your symptoms return.

## 2020-06-19 NOTE — ED Triage Notes (Signed)
Pt BIB EMS from home with c/o chest pain that started this morning. Took 2 nitroglycerin (0.8mg  total) before EMS arrival. Chest pain down to 1 on 0-10 scale upon arrival to [facility] ED.   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  EMS: 20G L. Forearm 134/90 HR 83-160 95% RA RR 20 CBG 141

## 2020-06-19 NOTE — ED Notes (Signed)
Pt ambulated to bathroom without difficulty.

## 2020-06-19 NOTE — ED Provider Notes (Signed)
[facility]ERGENC[facility] DEPARTMENT Provider Note  CSN: [id number] Arrival date & time: 06/19/20 4888    History Chief Complaint  Patient presents with  . Chest Pain    HPI  Austin [name] is a 79 y.o. 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 Took some NTG with improvement in pain, ASA given by EMS. HR with EMS variable between 80s-160s with reported wide complex tachycardia enroute. Patient reports compliance with medications including amiodarone, coumadin and metoprolol. Last INR 1 week ago was 3.1.    Past Medical History:  Diagnosis Date  . AICD (automatic cardioverter/defibrillator) present   . Atrial fibrillation ([place])   . 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 Conductive hearing loss   . Intraspinal abscess   . 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 Nonischemic cardiomyopathy ([place])   . Psychosexual dysfunction with inhibited sexual excitement   . S/P mitral valve replacement   . Syncope and collapse   . Unspecified sleep apnea    last sleep study 11/07  . Ventricular tachycardia ([place])   . VT (ventricular tachycardia) ([place])     Past Surgical History:  Procedure Laterality Date  . BIV ICD GENERATOR CHANGEOUT N/A 05/04/2017   Procedure: BiV ICD Generator Changeout;  Surgeon: Evans Lance, MD;  Location: [facility];  Service: Cardiovascular;  Laterality: N/A;  . CARDIAC CATHETERIZATION  04/24/2002  . CARDIAC VALVE REPLACEMENT    . CARDIOVERSION  03/09/2012   Procedure: CARDIOVERSION;  Surgeon: Evans Lance, MD;  Location: [facility];  Service: Cardiovascular;  Laterality: N/A;  . CARDIOVERSION N/A 11/22/2013   Procedure: CARDIOVERSION;  Surgeon: Sanda Klein, MD;  Location: [facility];  Service: Cardiovascular;  Laterality: N/A;  . dental implants    . ELECTROPHYSIOLOGIC STUDY N/A 09/15/2015   Procedure: V Tach Ablation;  Surgeon: Evans Lance, MD;  Location: [facility];  Service: Cardiovascular;  Laterality: N/A;  . Evacution of epidural lumbar epidural abscess  1999  . INSERT / REPLACE / REMOVE PACEMAKER  11/2008  . MITRAL VALVE REPLACEMENT     w #[id number] [facility]  . PERMANENT PACEMAKER INSERTION N/A 08/05/2011   Procedure: PERMANENT PACEMAKER INSERTION;  Surgeon: Evans Lance, MD;  Location: [facility] CATH LAB;  Service: Cardiovascular;  Laterality: N/A;  . THYROIDECTOMY    . TONSILLECTOMY    . VALVE REPLACEMENT  2000    Family History  Problem Relation Age of Onset  . Heart disease Mother   . Heart failure Mother   . Heart disease Father   . Heart failure Father     Social History   Tobacco Use  . Smoking status: Never Smoker  . Smokeless tobacco: Never Used  Vaping Use  . Vaping Use: Never used  Substance Use Topics  . Alcohol use: Yes    Alcohol/week: 0.0 standard drinks    Comment: occasionally  . Drug use: No     Home Medications Prior to Admission medications   Medication Sig Start Date End Date Taking? Authorizing Provider  acetaminophen (TYLENOL) 500 MG tablet Take 1,000 mg by mouth every 6 (six) hours as needed (pain).    [provider]  amiodarone (PACERONE) 200 MG tablet Take two tablet  by mouth daily until you are able to see Dr. Lovena Le 06/19/20   Truddie Hidden, MD  amoxicillin (AMOXIL) 500 MG capsule TAKE 4 CAPSULES BY MOUTH 30 MINUTES PRIOR TO DENTAL WORK 03/24/20   Evans Lance, MD  cephALEXin (KEFLEX) 500 MG capsule Take by mouth. Patient not taking: Reported on 04/17/2020 03/04/20   [provider]  dapagliflozin propanediol (FARXIGA) 5 MG TABS tablet Take 1 tablet (5 mg total) by mouth daily before breakfast. 01/18/20   Belva Crome, MD  furosemide (LASIX) 20 MG tablet  Take 1 tablet (20 mg total) by mouth daily. Patient not taking: Reported on 05/15/2020 03/24/20 06/22/20  Belva Crome, MD  levothyroxine (SYNTHROID) 50 MCG tablet TAKE 1 TABLET BY MOUTH DAILY BEFORE BREAKFAST 05/13/20   Evans Lance, MD  metoprolol succinate (TOPROL XL) 25 MG 24 hr tablet Take 0.5 tablets (12.5 mg total) by mouth daily. 02/28/20   Belva Crome, MD  Multiple Vitamin (MULTIVITAMIN WITH MINERALS) TABS tablet Take 1 tablet by mouth daily.    [provider]  nitroGLYCERIN (NITROSTAT) 0.4 MG SL tablet Place 1 tablet (0.4 mg total) under the tongue every 5 (five) minutes x 3 doses as needed for chest pain. 10/14/18   Kilroy, Doreene Burke, PA-C  sacubitril-valsartan (ENTRESTO) 24-26 MG Take 1 tablet by mouth 2 (two) times daily. 02/06/20   Belva Crome, MD  spironolactone (ALDACTONE) 25 MG tablet Take 0.5 tablets (12.5 mg total) by mouth daily. Patient taking differently: Take 12.5 mg by mouth every Monday, Wednesday, and Friday.  01/18/20   Belva Crome, MD  warfarin (COUMADIN) 5 MG tablet TAKE AS DIRECTED BY THE COUMADIN CLINIC 04/28/20   Belva Crome, MD     Allergies    Zetia [ezetimibe]   Review of Systems   Review of Systems A comprehensive review of systems was completed and negative except as noted in HPI.    Physical Exam BP 117/84   Pulse (!) 59   Temp 98.6 F (37 C) (Oral)   Resp 16   Ht 6\' 1"  (1.854 m)   Wt 90.7 kg   SpO2 97%   BMI 26.39 kg/m   Physical Exam Vitals and nursing note reviewed.  Constitutional:      Appearance: Normal appearance.  HENT:     Head: Normocephalic and atraumatic.     Nose: Nose normal.     Mouth/Throat:     Mouth: Mucous membranes are moist.  Eyes:     Extraocular Movements: Extraocular movements intact.     Conjunctiva/sclera: Conjunctivae normal.  Cardiovascular:     Rate and Rhythm: Tachycardia present.     Comments: Mechanical valve click Pulmonary:     Effort: Pulmonary effort is normal.     Breath  sounds: Normal breath sounds.  Abdominal:     General: Abdomen is flat.     Palpations: Abdomen is soft.     Tenderness: There is no abdominal tenderness.  Musculoskeletal:        General: No swelling. Normal range of motion.     Cervical back: Neck supple.  Skin:    General: Skin is warm and dry.  Neurological:     General: No focal deficit present.     Mental Status: He is alert.  Psychiatric:        Mood and Affect: Mood normal.      ED Results / Procedures / Treatments   Labs (all labs ordered are listed, but  only abnormal results are displayed) Labs Reviewed  BRAIN NATRIURETIC PEPTIDE - Abnormal; Notable for the following components:      Result Value   B Natriuretic Peptide 804.8 (*)    All other components within normal limits  CBC WITH DIFFERENTIAL/PLATELET - Abnormal; Notable for the following components:   RDW 15.8 (*)    All other components within normal limits  PROTIME-INR - Abnormal; Notable for the following components:   Prothrombin Time 32.0 (*)    INR 3.2 (*)    All other components within normal limits  BRAIN NATRIURETIC PEPTIDE - Abnormal; Notable for the following components:   B Natriuretic Peptide 600.9 (*)    All other components within normal limits  BASIC METABOLIC PANEL - Abnormal; Notable for the following components:   CO2 19 (*)    Glucose, Bld 107 (*)    BUN 30 (*)    Creatinine, Ser 1.45 (*)    Calcium 8.0 (*)    GFR, Estimated 50 (*)    All other components within normal limits  TROPONIN I (HIGH SENSITIVITY) - Abnormal; Notable for the following components:   Troponin I (High Sensitivity) 47 (*)    All other components within normal limits    EKG None  Radiology DG Chest Port 1 View  Result Date: 06/19/2020 CLINICAL DATA:  SOB, CP EXAM: PORTABLE CHEST 1 VIEW COMPARISON:  10/12/2018 chest radiograph and prior. FINDINGS: Indwelling right chest wall pacing device. Cardiomegaly. Prominence of the central pulmonary vessels. No  pneumothorax, focal consolidation or pleural effusion. Left chest wall pacing pads. Sequela of mitral valve repair and prior sternotomy. Bilateral shoulder osteoarthrosis. IMPRESSION: Cardiomegaly and prominence of the central pulmonary vessels. No focal consolidation. Electronically Signed   By: Primitivo Gauze M.D.   On: 06/19/2020 08:02   ECHOCARDIOGRAM COMPLETE  Result Date: 06/18/2020    ECHOCARDIOGRAM REPORT   Patient Name:   ERVINE WITUCKI Date of Exam: 06/18/2020 Medical Rec #:  [id number]        Height:       73.5 in Accession #:    [id number]       Weight:       203.0 lb Date of Birth:  July 27, 1940         BSA:          2.176 m Patient Age:    25 years         BP:           111/82 mmHg Patient Gender: M                HR:           65 bpm. Exam Location:  [facility] Procedure: 2D Echo, Intracardiac Opacification Agent, Cardiac Doppler, Color            Doppler and 3D Echo Indications:    I50.9 CHF  History:        Patient has prior history of Echocardiogram examinations, most                 recent 01/21/2020. Nonischemic cardiomyopathy, ICD, PAD,                 Arrythmias:Atrial Fibrillation and VT; Signs/Symptoms:Syncope.  Sonographer:    Marygrace Drought RCS Referring Phys: [place]  1. Left ventricular ejection fraction, by estimation, is 30 to 35%. The left ventricle has moderately decreased function. The left ventricle demonstrates global hypokinesis. Left ventricular diastolic parameters are indeterminate.  2. Right ventricular systolic function is moderately reduced. The right ventricular size is moderately enlarged. There is normal pulmonary artery systolic pressure.  3. Left atrial size was severely dilated.  4. Right atrial size was mildly dilated.  5. Mechanical mitral valve. No evidence of mitral valve regurgitation. MG 4 mmHg at HR 62 bpm, stable from prior echo.  6. The aortic valve was not well visualized. Aortic valve regurgitation is not visualized. Mild to  moderate aortic valve sclerosis/calcification is present, without any evidence of aortic stenosis.  7. Aortic dilatation noted. There is mild dilatation of the aortic root, measuring 40 mm. FINDINGS  Left Ventricle: Left ventricular ejection fraction, by estimation, is 30 to 35%. The left ventricle has moderately decreased function. The left ventricle demonstrates global hypokinesis. The left ventricular internal cavity size was normal in size. There is no left ventricular hypertrophy. Left ventricular diastolic parameters are indeterminate. Right Ventricle: The right ventricular size is moderately enlarged. Right vetricular wall thickness was not assessed. Right ventricular systolic function is moderately reduced. There is normal pulmonary artery systolic pressure. The tricuspid regurgitant  velocity is 1.76 m/s, and with an assumed right atrial pressure of 3 mmHg, the estimated right ventricular systolic pressure is 20.9 mmHg. Left Atrium: Left atrial size was severely dilated. Right Atrium: Right atrial size was mildly dilated. Pericardium: There is no evidence of pericardial effusion. Presence of pericardial fat pad. Mitral Valve: The mitral valve has been repaired/replaced. No evidence of mitral valve regurgitation. MV peak gradient, 8.4 mmHg. The mean mitral valve gradient is 3.0 mmHg. Tricuspid Valve: The tricuspid valve is normal in structure. Tricuspid valve regurgitation is mild. Aortic Valve: The aortic valve was not well visualized. Aortic valve regurgitation is not visualized. Mild to moderate aortic valve sclerosis/calcification is present, without any evidence of aortic stenosis. Pulmonic Valve: The pulmonic valve was normal in structure. Pulmonic valve regurgitation is not visualized. Aorta: Aortic dilatation noted. There is mild dilatation of the aortic root, measuring 40 mm. IAS/Shunts: The interatrial septum was not well visualized.  LEFT VENTRICLE PLAX 2D LVIDd:         5.60 cm  Diastology LVIDs:          5.20 cm  LV e' lateral:   6.42 cm/s LV PW:         0.80 cm  LV E/e' lateral: 21.0 LV IVS:        1.00 cm LVOT diam:     2.20 cm LV SV:         44 LV SV Index:   20 LVOT Area:     3.80 cm  RIGHT VENTRICLE RV Basal diam:  4.70 cm RV S prime:     5.22 cm/s TAPSE (M-mode): 1.2 cm RVSP:           15.4 mmHg LEFT ATRIUM              Index       RIGHT ATRIUM           Index LA diam:        3.90 cm  1.79 cm/m  RA Pressure: 3.00 mmHg LA Vol (A2C):   154.0 ml 70.76 ml/m RA Area:     23.30 cm LA Vol (A4C):   120.0 ml 55.14 ml/m RA Volume:   76.30 ml  35.06 ml/m LA Biplane Vol: 137.0 ml 62.95 ml/m  AORTIC VALVE LVOT Vmax:   51.90 cm/s LVOT Vmean:  38.700 cm/s LVOT VTI:    0.115 m  AORTA Ao Root diam: 4.00 cm Ao Asc diam:  3.40 cm MITRAL VALVE                TRICUSPID VALVE MV Area (PHT): 3.28 cm     TR Peak grad:   12.4 mmHg MV Peak grad:  8.4 mmHg     TR Vmax:        176.00 cm/s MV Mean grad:  3.0 mmHg     Estimated RAP:  3.00 mmHg MV Vmax:       1.45 m/s     RVSP:           15.4 mmHg MV Vmean:      74.8 cm/s MV Decel Time: 232 msec     SHUNTS MV E velocity: 134.50 cm/s  Systemic VTI:  0.12 m                             Systemic Diam: 2.20 cm Oswaldo Milian MD Electronically signed by Oswaldo Milian MD Signature Date/Time: 06/18/2020/6:50:09 PM    Final     Procedures .Sedation  Date/Time: 06/19/2020 8:13 AM Performed by: Truddie Hidden, MD Authorized by: Truddie Hidden, MD   Consent:    Consent obtained:  Verbal and written   Consent given by:  Patient   Risks discussed:  Inadequate sedation, nausea, respiratory compromise necessitating ventilatory assistance and intubation and prolonged hypoxia resulting in organ damage   Alternatives discussed:  Analgesia without sedation and anxiolysis Universal protocol:    Immediately prior to procedure a time out was called: yes     Patient identity confirmation method:  Arm band and verbally with patient Indications:    Procedure  performed:  Cardioversion Pre-sedation assessment:    Time since last food or drink:  8   ASA classification: class 3 - patient with severe systemic disease     Neck mobility: normal     Mouth opening:  3 or more finger widths   Thyromental distance:  4 finger widths   Mallampati score:  I - soft palate, uvula, fauces, pillars visible   Pre-sedation assessments completed and reviewed: airway patency, cardiovascular function, hydration status, mental status, nausea/vomiting and respiratory function     Pre-sedation assessment completed:  06/19/2020 7:15 AM Immediate pre-procedure details:    Reviewed: vital signs     Verified: bag valve mask available, emergency equipment available, intubation equipment available, IV patency confirmed, oxygen available and suction available   Procedure details (see MAR for exact dosages):    Preoxygenation:  Nasal cannula   Sedation:  Propofol   Intended level of sedation: deep   Analgesia:  Fentanyl   Intra-procedure monitoring:  Blood pressure monitoring, cardiac monitor, continuous capnometry, continuous pulse oximetry, frequent LOC assessments and frequent vital sign checks   Intra-procedure events: hypoxia     Intra-procedure management:  Airway repositioning, BVM ventilation and supplemental oxygen   Total Provider sedation time (minutes):  15 Post-procedure details:    Post-sedation assessment completed:  06/19/2020 8:15 AM   Attendance: Constant attendance by certified staff until patient recovered     Recovery: Patient returned to pre-procedure baseline     Post-sedation assessments completed and reviewed: airway patency, cardiovascular function and respiratory function     Patient is stable for discharge or admission: yes     Patient tolerance:  Tolerated well, no immediate complications .Cardioversion  Date/Time: 06/19/2020 8:15 AM Performed by: Truddie Hidden, MD Authorized by:  Truddie Hidden, MD   Consent:    Consent obtained:   Verbal and written   Consent given by:  Patient   Risks discussed:  Induced arrhythmia and pain   Alternatives discussed:  No treatment and rate-control medication Pre-procedure details:    Cardioversion basis:  Emergent   Rhythm:  Atrial fibrillation   Electrode placement:  Anterior-posterior Patient sedated: Yes. Refer to sedation procedure documentation for details of sedation.  Attempt one:    Cardioversion mode:  Synchronous   Waveform:  Biphasic   Shock (Joules):  120   Shock outcome:  Conversion to other rhythm Post-procedure details:    Patient status:  Awake   Patient tolerance of procedure:  Tolerated well, no immediate complications .Critical Care Performed by: Truddie Hidden, MD Authorized by: Truddie Hidden, MD   Critical care provider statement:    Critical care time (minutes):  45   Critical care was necessary to treat or prevent imminent or life-threatening deterioration of the following conditions:  Cardiac failure and circulatory failure   Critical care was time spent personally by me on the following activities:  Discussions with consultants, evaluation of patient's response to treatment, examination of patient, ordering and performing treatments and interventions, ordering and review of laboratory studies, ordering and review of radiographic studies, pulse oximetry, re-evaluation of patient's condition, obtaining history from patient or surrogate, review of old charts and development of treatment plan with patient or surrogate    Medications Ordered in the ED Medications  fentaNYL (SUBLIMAZE) injection 50 mcg (has no administration in time range)  propofol (DIPRIVAN) 10 mg/mL bolus/IV push 45.4 mg (has no administration in time range)  fentaNYL (SUBLIMAZE) injection (50 mcg Intravenous Given 06/19/20 0758)  propofol (DIPRIVAN) 10 mg/mL bolus/IV push (50 mg Intravenous Given 06/19/20 0759)  0.9 %  sodium chloride infusion (0 mLs Intravenous Stopped  06/19/20 1200)     MDM Rules/Calculators/A&P MDM Patient's initial EKG with wide complex tachycardia appears to be afib with RVR and aberrancy compared with previous EKG. He is borderline hypotensive in the ED. Will discuss with cardiology.  ED Course  I have reviewed the triage vital signs and the nursing notes.  Pertinent labs & imaging results that were available during my care of the patient were reviewed by me and considered in my medical decision making (see chart for details).  Clinical Course as of Jun 19 1249  Thu Jun 19, 2020  0741 Spoke with Dr. Marlou Porch, Cardiology, who has reviewed today's EKG and agrees this is likely rapid afib. Given his symptoms and borderline low BP he recommends electrical cardioversion. Discussed this with the patient who understands risks and consents to proceed with procedure including sedation.    [CS]  8309 CXR results and images reviewed, no acute findings. CBC is normal. INR therapeutic.    [CS]  0956 BMP shows Cr about at baseline. First Trop mildly elevated.    [CS]  4076 Patient has remained rate controlled and asymptomatic since cardioversion. Spoke again with Dr. Marlou Porch and reviewed labs including initial Trop mildly elevated but drawn after cardioversion. Dr. Marlou Porch does not feel that a second troponin would be helpful or change his management. He recommends increasing his Amiodarone to 400mg  daily until he is able to get into the EP clinic. Patient is amenable to this plan. Will d/c home, advised to return to the ED if his symptoms return or for any other concerns.    [CS]    Clinical Course User Index [  CS] Truddie Hidden, MD    Final Clinical Impression(s) / ED Diagnoses Final diagnoses:  Atrial fibrillation with rapid ventricular response [facility])    Rx / DC Orders ED Discharge Orders         Ordered    amiodarone (PACERONE) 200 MG tablet        06/19/20 1051           Truddie Hidden, MD 06/19/20 1251

## 2020-06-22 NOTE — Progress Notes (Signed)
Cardiology Office Note:    Date:  06/24/2020   ID:  Austin [name], DOB 08-Apr-1941, MRN [id number]  PCP:  Lajean Manes, MD  Cardiologist:  Sinclair Grooms, MD   Referring MD: Lajean Manes, MD   Chief Complaint  Patient presents with  . Atrial Fibrillation  . Congestive Heart Failure  . Advice Only    Ventricular tachycardia    History of Present Illness:    Austin [name] is a 79 y.o. male with a hx of mechanical mitral valve, chronic anticoagulation therapy, nonischemic cardiomyopathy, biventricular implantable cardiac defibrillator, recurrent ventricular tachycardia,amiodarone for VT suppression,multiple medication regimen to suppress ventricular ectopy, and sleep apnea.Echo performed June 2021 revealed EF of 20 to 19% and systolic heart failure management titrated/added.  Paroxysmal atrial fibrillation 06/19/2020 treated with electrical cardioversion and increase amiodarone loading.  Most recent echo 11/24 reveals improve EF to 35% compared to June.  Austin [name] was admitted to [facility] emergency room feeling weak, short of breath, and having chest pain.  He began feeling poorly the day prior to admission and was unable to sleep or get into a comfortable position throughout the night.  Finally he decided to call EMS and at 6:30 AM he was taken to the emergency room at [facility].  EKGs demonstrated ventricular tachycardia at a rate of around 160 bpm.  After consultation with Dr. Marlou Porch, elective cardioversion was performed in the emergency room by the ED staff.  He was discharged home later that day.  Interrogation of the device has been interpreted and demonstrated multiple episodes of wide-complex tachycardia some of which were terminated by pacing.  He did not receive any internal shocks.  He was discharged from the emergency room on 200 mg daily of amiodarone (based upon his interpretation of instructions).  He was instructed to take 200 mg twice daily.  This was confirmed  by Dr. Lovena Le on of device interrogation on 06/23/2029.  He otherwise feels dizzy.  Does not have as much energy as he once did.  Tells me that he has gained 5 pounds since furosemide was discontinued.  Is having no difficulty with swelling or edema.  Past Medical History:  Diagnosis Date  . AICD (automatic cardioverter/defibrillator) present   . Atrial fibrillation ([name])   . 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 Conductive hearing loss   . Intraspinal abscess   . 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 Nonischemic cardiomyopathy ([place])   . Psychosexual dysfunction with inhibited sexual excitement   . S/P mitral valve replacement   . Syncope and collapse   . Unspecified sleep apnea    last sleep study 11/07  . Ventricular tachycardia ([place])   . VT (ventricular tachycardia) ([name])     Past Surgical History:  Procedure Laterality Date  . BIV ICD GENERATOR CHANGEOUT N/A 05/04/2017   Procedure: BiV ICD Generator Changeout;  Surgeon: Evans Lance, MD;  Location: [place] [facility];  Service: Cardiovascular;  Laterality: N/A;  . CARDIAC CATHETERIZATION  04/24/2002  . CARDIAC VALVE REPLACEMENT    . CARDIOVERSION  03/09/2012   Procedure: CARDIOVERSION;  Surgeon: Evans Lance, MD;  Location: [place];  Service: Cardiovascular;  Laterality: N/A;  . CARDIOVERSION N/A 11/22/2013   Procedure: CARDIOVERSION;  Surgeon: Sanda Klein, MD;  Location: [facility];  Service: Cardiovascular;  Laterality: N/A;  . dental implants    .  ELECTROPHYSIOLOGIC STUDY N/A 09/15/2015   Procedure: V Tach Ablation;  Surgeon: Evans Lance, MD;  Location: [place] [facility];  Service: Cardiovascular;  Laterality: N/A;  . Evacution of epidural lumbar epidural abscess  1999  . INSERT / REPLACE / REMOVE PACEMAKER  11/2008  . MITRAL VALVE REPLACEMENT     w [id number]  . PERMANENT  PACEMAKER INSERTION N/A 08/05/2011   Procedure: PERMANENT PACEMAKER INSERTION;  Surgeon: Evans Lance, MD;  Location: [facility] CATH LAB;  Service: Cardiovascular;  Laterality: N/A;  . THYROIDECTOMY    . TONSILLECTOMY    . VALVE REPLACEMENT  2000    Current Medications: Current Meds  Medication Sig  . acetaminophen (TYLENOL) 500 MG tablet Take 1,000 mg by mouth every 6 (six) hours as needed (pain).  Marland Kitchen amiodarone (PACERONE) 200 MG tablet Take 1 tablet (200 mg total) by mouth 2 (two) times daily.  Marland Kitchen amoxicillin (AMOXIL) 500 MG capsule TAKE 4 CAPSULES BY MOUTH 30 MINUTES PRIOR TO DENTAL WORK  . cephALEXin (KEFLEX) 500 MG capsule Take by mouth.   . dapagliflozin propanediol (FARXIGA) 5 MG TABS tablet Take 1 tablet (5 mg total) by mouth daily before breakfast.  . levothyroxine (SYNTHROID) 50 MCG tablet TAKE 1 TABLET BY MOUTH DAILY BEFORE BREAKFAST  . metoprolol succinate (TOPROL XL) 25 MG 24 hr tablet Take 0.5 tablets (12.5 mg total) by mouth daily.  . Multiple Vitamin (MULTIVITAMIN WITH MINERALS) TABS tablet Take 1 tablet by mouth daily.  . nitroGLYCERIN (NITROSTAT) 0.4 MG SL tablet Place 1 tablet (0.4 mg total) under the tongue every 5 (five) minutes x 3 doses as needed for chest pain.  . sacubitril-valsartan (ENTRESTO) 24-26 MG Take 1 tablet by mouth 2 (two) times daily.  Marland Kitchen spironolactone (ALDACTONE) 25 MG tablet Take 0.5 tablets (12.5 mg total) by mouth daily. (Patient taking differently: Take 12.5 mg by mouth every Monday, Wednesday, and Friday. )  . warfarin (COUMADIN) 5 MG tablet TAKE AS DIRECTED BY THE COUMADIN CLINIC  . [DISCONTINUED] amiodarone (PACERONE) 200 MG tablet Take two tablet by mouth daily until you are able to see Dr. Lovena Le     Allergies:   Zetia [ezetimibe]   Social History   Socioeconomic History  . Marital status: Married    Spouse name: Not on file  . Number of children: 1  . Years of education: 46  . Highest education level: Master's degree (e.g., MA, MS, MEng, MEd,  MSW, MBA)  Occupational History  . Occupation: retired from Engineer, manufacturing systems  Tobacco Use  . Smoking status: Never Smoker  . Smokeless tobacco: Never Used  Vaping Use  . Vaping Use: Never used  Substance and Sexual Activity  . Alcohol use: Yes    Alcohol/week: 0.0 standard drinks    Comment: occasionally  . Drug use: No  . Sexual activity: Not on file  Other Topics Concern  . Not on file  Social History Narrative   Lives with wife in a one story home.  Has one daughter.  Retired.  Education: Masters.   Social Determinants of Health   Financial Resource Strain:   . Difficulty of Paying Living Expenses: Not on file  Food Insecurity:   . Worried About Charity fundraiser in the Last Year: Not on file  . Ran Out of Food in the Last Year: Not on file  Transportation Needs:   . Lack of Transportation (Medical): Not on file  . Lack of Transportation (Non-Medical): Not on file  Physical  Activity:   . Days of Exercise per Week: Not on file  . Minutes of Exercise per Session: Not on file  Stress:   . Feeling of Stress : Not on file  Social Connections:   . Frequency of Communication with Friends and Family: Not on file  . Frequency of Social Gatherings with Friends and Family: Not on file  . Attends Religious Services: Not on file  . Active Member of Clubs or Organizations: Not on file  . Attends Archivist Meetings: Not on file  . Marital Status: Not on file     Family History: The patient's family history includes Heart disease in his father and mother; Heart failure in his father and mother.  ROS:   Please see the history of present illness.    Feels he is having potential side effects related to amiodarone therapy.  No recurrence of chest pain.  Appetite has been stable.  All other systems reviewed and are negative.  EKGs/Labs/Other Studies Reviewed:    The following studies were reviewed today:  2D Doppler echocardiogram 06/18/2020: IMPRESSIONS    1.  Left ventricular ejection fraction, by estimation, is 30 to 35%. The  left ventricle has moderately decreased function. The left ventricle  demonstrates global hypokinesis. Left ventricular diastolic parameters are  indeterminate.  2. Right ventricular systolic function is moderately reduced. The right  ventricular size is moderately enlarged. There is normal pulmonary artery  systolic pressure.  3. Left atrial size was severely dilated.  4. Right atrial size was mildly dilated.  5. Mechanical mitral valve. No evidence of mitral valve regurgitation. MG  4 mmHg at HR 62 bpm, stable from prior echo.  6. The aortic valve was not well visualized. Aortic valve regurgitation  is not visualized. Mild to moderate aortic valve sclerosis/calcification  is present, without any evidence of aortic stenosis.  7. Aortic dilatation noted. There is mild dilatation of the aortic root,  measuring 40 mm.   EKG:  EKG EKG is not repeated today.  Recent Labs: 01/14/2020: NT-Pro BNP 1,971 06/19/2020: B Natriuretic Peptide 600.9; BUN 30; Creatinine, Ser 1.45; Hemoglobin 15.8; Platelets 154; Potassium 5.0; Sodium 135  Recent Lipid Panel    Component Value Date/Time   CHOL 158 08/23/2015 0543   TRIG 130 08/23/2015 0543   HDL 50 08/23/2015 0543   CHOLHDL 3.2 08/23/2015 0543   VLDL 26 08/23/2015 0543   LDLCALC 82 08/23/2015 0543    Physical Exam:    VS:  BP 114/78   Pulse (!) 55   Ht 6\' 1"  (1.854 m)   Wt 206 lb 6.4 oz (93.6 kg)   SpO2 99%   BMI 27.23 kg/m     Wt Readings from Last 3 Encounters:  06/24/20 206 lb 6.4 oz (93.6 kg)  06/19/20 200 lb (90.7 kg)  04/17/20 203 lb (92.1 kg)     GEN: Appears well.. No acute distress HEENT: Normal NECK: No JVD. LYMPHATICS: No lymphadenopathy CARDIAC: 2/6 left lower sternal systolic murmur without radiation into the left axilla.  RRR without diastolic murmur, gallop, or edema. VASCULAR:  Normal Pulses. No bruits. RESPIRATORY:  Clear to auscultation  without rales, wheezing or rhonchi  ABDOMEN: Soft, non-tender, non-distended, No pulsatile mass, MUSCULOSKELETAL: No deformity  SKIN: Warm and dry NEUROLOGIC:  Alert and oriented x 3 PSYCHIATRIC:  Normal affect   ASSESSMENT:    1. Chronic systolic heart failure ([place])   2. Atrial fibrillation, unspecified type ([place])   3. Bilateral carotid artery stenosis  4. ICD (implantable cardioverter-defibrillator) in place   5. VT (ventricular tachycardia) ([place])   6. S/P mitral valve replacement   7. Obstructive sleep apnea   8. Educated about COVID-19 virus infection    PLAN:    In order of problems listed above:  1. Ejection fraction on guideline directed therapy has improved to 35%.  Improvement in LVEF discussed with the patient and wife.  Loop diuretic therapy has been discontinued but may need to be resumed if weight gain continues.  No current clinical evidence of volume overload.  Electrolytes were stable in the emergency room with creatinine of 1.46 mg/dL, potassium 5.0.  Continue to monitor weight. 2. Chronic and is on anticoagulation therapy to avoid embolic stroke.  Amiodarone is for suppression of ventricular arrhythmias. 3. Did not discuss carotid disease 4. ICD interrogation has been performed and interpreted by Dr. Lovena Le who is recommended reloading with amiodarone 200 mg twice daily until he is seen on December 28 by Dr. Lovena Le. 5. Recurrent VT several weeks after reducing baseline amiodarone dose to 200 mg 5 days a week. 6. Valve closure sounds are crisp. 7. I presume he is using CPAP but did not specifically discuss. 8. Anticoagulated on Coumadin.  No bleeding complications.   Medication Adjustments/Labs and Tests Ordered: Current medicines are reviewed at length with the patient today.  Concerns regarding medicines are outlined above.  No orders of the defined types were placed in this encounter.  Meds ordered this encounter  Medications  . amiodarone (PACERONE) 200 MG  tablet    Sig: Take 1 tablet (200 mg total) by mouth 2 (two) times daily.    Dispense:  60 tablet    Refill:  3    Dose change    Patient Instructions  Medication Instructions:  1) INCREASE Amiodarone to 200mg  twice daily until I call you and tell you what Dr. Lovena Le says  *If you need a refill on your cardiac medications before your next appointment, please call your pharmacy*   Lab Work: None If you have labs (blood work) drawn today and your tests are completely normal, you will receive your results only by: Marland Kitchen MyChart Message (if you have MyChart) OR . A paper copy in the mail If you have any lab test that is abnormal or we need to change your treatment, we will call you to review the results.   Testing/Procedures: None   Follow-Up: At [facility], you and your health needs are our priority.  As part of our continuing mission to provide you with exceptional heart care, we have created designated Provider Care Teams.  These Care Teams include your primary Cardiologist (physician) and Advanced Practice Providers (APPs -  Physician Assistants and Nurse Practitioners) who all work together to provide you with the care you need, when you need it.   Your next appointment:   5 month(s)  The format for your next appointment:   In Person  Provider:   You may see Sinclair Grooms, MD or one of the following Advanced Practice Providers on your designated Care Team:    Truitt Merle, NP  Cecilie Kicks, NP  Kathyrn Drown, NP    Other Instructions      Signed, Sinclair Grooms, MD  06/24/2020 1:04 PM    [place]

## 2020-06-23 ENCOUNTER — Ambulatory Visit (INDEPENDENT_AMBULATORY_CARE_PROVIDER_SITE_OTHER): Payer: Medicare Other

## 2020-06-23 ENCOUNTER — Telehealth: Payer: Self-pay | Admitting: Student

## 2020-06-23 DIAGNOSIS — Z95810 Presence of automatic (implantable) cardiac defibrillator: Secondary | ICD-10-CM

## 2020-06-23 DIAGNOSIS — I5022 Chronic systolic (congestive) heart failure: Secondary | ICD-10-CM

## 2020-06-23 NOTE — Telephone Encounter (Signed)
  Alert for 4 treated VHR events logged 13 to 29 seconds w/ rates 200's to 220's bpm; EGMs suggest A Fib w/ RVR that received unsuccessful ATP x 1-3 followed by slowing below detection rate; 1 aborted shock also noted. 15 VHR events logged 1-2 seconds w/ rates 190's to 200's bpm; EGMs suggest A Fib w/ RVR.  History of AF on: coumadin Amiodarone (increased to 200 mg BID by ED) Metoprolol 12.5 mg daily  Otherwise, V rates appear well controlled.   Seen in ED 06/19/2020 and underwent [facility] in ED. Amiodarone increased as above in dissussion with Dr. Marlou Porch.  Two episodes included below. Others consistent. First episode more clearly A->V. Second episode below more difficult to appreciate A-V association at times.

## 2020-06-23 NOTE — Progress Notes (Signed)
EPIC Encounter for ICM Monitoring  Patient Name: Austin [name] is a 79 y.o. male Date: 06/23/2020 Primary Care Physican: Lajean Manes, MD Primary Cardiologist:Smith Electrophysiologist: Lovena Le 9/24/2021Weight:198-199lbs  Clinical Status Since 19-May-2020 Treated VF 0 FVT 1 VT 3  Monitored VT-NS (>4 beats, >176 bpm) 15  AT/AF                    1025 Episodes Time in AT/AF 8.8 hr/day (36.9%) Longest AT/AF 93 minutes   VT/VF  Therapy Summary  Pace-Terminated Episodes 4 of 4  Shock-Terminated Episodes 0 Total Shocks                         0  Aborted Charges                    1    Attempted call to patient and unable to reach.   Transmission reviewed.           Per 06/23/2020 device clinic note EGMs suggest A Fib that received unsuccessful ATP x 1-3; 1 aborted shock also noted.  Seen in ED 06/19/2020 and underwent [facility] in ED.  OptivolThoracic impedancesuggesting possible fluid accumulation since 06/15/2020 but has trended almost back to baseline.Pt contacted 11/29 by Oda Kilts PA regarding pace terminated episodes.    Prescribed:Furosemide 20 mgTake 1 tablet by mouth daily. May take an extra tablet as needed for weight gain.  Labs: 06/19/2020 Creatinine 1.45, BUN 30, Potassium 5.0, Sodium 135, GFR 50 04/30/2020 Creatinine 1.39, BUN 23, Potassium 4.5, Sodium 137, GFR 49-56 04/08/2020 Creatinine 1.19, BUN 22, Potassium 4.2, Sodium 136, GFR 59-68  02/15/2020 Creatinine 1.28, BUN 21, Potassium 4.4, Sodium 136, GFR 54-62  01/29/2020 Creatinine1.26, BUN25, Potassium4.3, Sodium135, BHA19-37 01/14/2020 Creatinine1.17, BUN19, Potassium4.0, Sodium140, TKW40-97  Acomplete set of results can be found in Results Review.  Recommendations:Unable to reach.    Follow-up plan: ICM clinic phone appointment on1/09/2020. 91 day device clinic remote transmission12/07/2019.   EP/Cardiology Office Visits:06/24/2020 with Dr.Smith. 07/22/2020 with  Dr Lovena Le  Copy of ICM check sent to Dr.Taylor and Dr Tamala Julian for [facility] since patient has office visit tomorrow, 06/24/2020.  3 month ICM trend: 06/23/2020    1 Year ICM trend:       [name], RN 06/23/2020 11:28 AM

## 2020-06-23 NOTE — Telephone Encounter (Signed)
Arrhythmia is VT. Agree with increase of the amiodarone.

## 2020-06-24 ENCOUNTER — Encounter: Payer: Self-pay | Admitting: Interventional Cardiology

## 2020-06-24 ENCOUNTER — Ambulatory Visit: Payer: Medicare Other | Admitting: Interventional Cardiology

## 2020-06-24 ENCOUNTER — Other Ambulatory Visit: Payer: Self-pay

## 2020-06-24 VITALS — BP 114/78 | HR 55 | Ht 73.0 in | Wt 206.4 lb

## 2020-06-24 DIAGNOSIS — I472 Ventricular tachycardia, unspecified: Secondary | ICD-10-CM

## 2020-06-24 DIAGNOSIS — Z952 Presence of prosthetic heart valve: Secondary | ICD-10-CM

## 2020-06-24 DIAGNOSIS — Z7189 Other specified counseling: Secondary | ICD-10-CM

## 2020-06-24 DIAGNOSIS — Z95810 Presence of automatic (implantable) cardiac defibrillator: Secondary | ICD-10-CM

## 2020-06-24 DIAGNOSIS — I4891 Unspecified atrial fibrillation: Secondary | ICD-10-CM

## 2020-06-24 DIAGNOSIS — I6523 Occlusion and stenosis of bilateral carotid arteries: Secondary | ICD-10-CM

## 2020-06-24 DIAGNOSIS — I5022 Chronic systolic (congestive) heart failure: Secondary | ICD-10-CM | POA: Diagnosis not present

## 2020-06-24 DIAGNOSIS — G4733 Obstructive sleep apnea (adult) (pediatric): Secondary | ICD-10-CM

## 2020-06-24 MED ORDER — AMIODARONE HCL 200 MG PO TABS: 200.0000 mg | ORAL_TABLET | Freq: Two times a day (BID) | ORAL | 3 refills | Status: DC

## 2020-06-24 NOTE — Patient Instructions (Signed)
Medication Instructions:  1) INCREASE Amiodarone to 200mg  twice daily until I call you and tell you what Dr. Lovena Le says  *If you need a refill on your cardiac medications before your next appointment, please call your pharmacy*   Lab Work: None If you have labs (blood work) drawn today and your tests are completely normal, you will receive your results only by: Marland Kitchen MyChart Message (if you have MyChart) OR . A paper copy in the mail If you have any lab test that is abnormal or we need to change your treatment, we will call you to review the results.   Testing/Procedures: None   Follow-Up: At [facility], you and your health needs are our priority.  As part of our continuing mission to provide you with exceptional heart care, we have created designated Provider Care Teams.  These Care Teams include your primary Cardiologist (physician) and Advanced Practice Providers (APPs -  Physician Assistants and Nurse Practitioners) who all work together to provide you with the care you need, when you need it.   Your next appointment:   5 month(s)  The format for your next appointment:   In Person  Provider:   You may see Sinclair Grooms, MD or one of the following Advanced Practice Providers on your designated Care Team:    Truitt Merle, NP  Cecilie Kicks, NP  Kathyrn Drown, NP    Other Instructions

## 2020-06-25 ENCOUNTER — Ambulatory Visit (INDEPENDENT_AMBULATORY_CARE_PROVIDER_SITE_OTHER): Payer: Medicare Other

## 2020-06-25 DIAGNOSIS — I472 Ventricular tachycardia, unspecified: Secondary | ICD-10-CM

## 2020-06-26 ENCOUNTER — Ambulatory Visit (INDEPENDENT_AMBULATORY_CARE_PROVIDER_SITE_OTHER): Payer: Medicare Other | Admitting: *Deleted

## 2020-06-26 DIAGNOSIS — Z5181 Encounter for therapeutic drug level monitoring: Secondary | ICD-10-CM

## 2020-06-26 DIAGNOSIS — I4891 Unspecified atrial fibrillation: Secondary | ICD-10-CM

## 2020-06-26 LAB — POCT INR: INR: 4.1 — AB (ref 2.0–3.0)

## 2020-06-26 LAB — CUP PACEART REMOTE DEVICE CHECK
Battery Remaining Longevity: 78 mo
Battery Voltage: 2.96 V
Brady Statistic AP VP Percent: 0 %
Brady Statistic AP VS Percent: 0 %
Brady Statistic AS VP Percent: 82.74 %
Brady Statistic AS VS Percent: 17.26 %
Brady Statistic RA Percent Paced: 0 %
Brady Statistic RV Percent Paced: 87.82 %
Date Time Interrogation Session: 20211201022725
HighPow Impedance: 76 Ohm
Implantable Lead Implant Date: 20130110
Implantable Lead Implant Date: 20130110
Implantable Lead Location: 753859
Implantable Lead Location: 753860
Implantable Lead Model: 5076
Implantable Lead Model: 6935
Implantable Pulse Generator Implant Date: 20181010
Lead Channel Impedance Value: 342 Ohm
Lead Channel Impedance Value: 399 Ohm
Lead Channel Impedance Value: 456 Ohm
Lead Channel Pacing Threshold Amplitude: 0.875 V
Lead Channel Pacing Threshold Pulse Width: 0.4 ms
Lead Channel Sensing Intrinsic Amplitude: 1.875 mV
Lead Channel Sensing Intrinsic Amplitude: 1.875 mV
Lead Channel Sensing Intrinsic Amplitude: 7 mV
Lead Channel Sensing Intrinsic Amplitude: 7 mV
Lead Channel Setting Pacing Amplitude: 2.5 V
Lead Channel Setting Pacing Pulse Width: 0.4 ms
Lead Channel Setting Sensing Sensitivity: 0.3 mV

## 2020-07-02 NOTE — Progress Notes (Signed)
Remote ICD transmission.   

## 2020-07-03 ENCOUNTER — Ambulatory Visit (INDEPENDENT_AMBULATORY_CARE_PROVIDER_SITE_OTHER): Payer: Medicare Other | Admitting: *Deleted

## 2020-07-03 DIAGNOSIS — I4891 Unspecified atrial fibrillation: Secondary | ICD-10-CM | POA: Diagnosis not present

## 2020-07-03 DIAGNOSIS — Z5181 Encounter for therapeutic drug level monitoring: Secondary | ICD-10-CM

## 2020-07-03 LAB — POCT INR: INR: 3 (ref 2.0–3.0)

## 2020-07-03 NOTE — Patient Instructions (Addendum)
Description   Spoke with pt and advised him to continue taking 1 tablet daily except 1/2 tablet on Sundays and Thursdays. Recheck INR in 11 days prior to going out of town-Amio increased.

## 2020-07-07 ENCOUNTER — Telehealth: Payer: Self-pay | Admitting: Interventional Cardiology

## 2020-07-07 NOTE — Telephone Encounter (Signed)
-----   Message from Marius Ditch, MD sent at 07/07/2020 12:11 PM EST ----- Regarding: CPAP and O2 Hi Hank,   Just to follow-up on Mr. Cobarrubias. I saw his EF had improved quite a bit. 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 His EF is still so low we cannot use ASV (a type of PAP device that would solve central apnea but was associated with increased mortality in EF < 45% in only RCT done, so contraindicated). I checked an overnight oximetry and he has about 34 minutes < 88% so I offered him oxygen with his current PAP therapy. HOwever, in order to do that per Medicare, he has to go to the sleep lab for a night. He did not want to do that.   So, just alerting you to all this. No need to respond or do anything. Just wanted you to be aware.   Merry Christmas!   Clair Gulling

## 2020-07-07 NOTE — Telephone Encounter (Signed)
Received. Thanks.

## 2020-07-09 ENCOUNTER — Ambulatory Visit: Payer: Medicare Other | Admitting: Interventional Cardiology

## 2020-07-15 ENCOUNTER — Telehealth: Payer: Self-pay | Admitting: Interventional Cardiology

## 2020-07-15 ENCOUNTER — Ambulatory Visit (INDEPENDENT_AMBULATORY_CARE_PROVIDER_SITE_OTHER): Payer: Medicare Other

## 2020-07-15 DIAGNOSIS — I4891 Unspecified atrial fibrillation: Secondary | ICD-10-CM | POA: Diagnosis not present

## 2020-07-15 DIAGNOSIS — Z5181 Encounter for therapeutic drug level monitoring: Secondary | ICD-10-CM | POA: Diagnosis not present

## 2020-07-15 LAB — POCT INR: INR: 4.6 — AB (ref 2.0–3.0)

## 2020-07-15 NOTE — Telephone Encounter (Signed)
Summer with [facility] IN[facility] is calling to report patient's INR of 4.6, taken this morning. Please return call to discuss at [phone].

## 2020-07-15 NOTE — Patient Instructions (Addendum)
°  Description   Spoke with pt and advised him to skip today's dosage of Warfarin, then take 1/2 tablet tomorrow, then start taking 1 tablet daily except 1/2 tablet on Sundays, Tuesdays and Thursdays. Recheck INR in 1 week. Amiodarone increased to BID on 06/26/20.   Pt will be out of town on 07/22/20 next recheck, call results to cell phone [phone].

## 2020-07-15 NOTE — Telephone Encounter (Signed)
Results addressed with pt this morning, please refer to Anticoagulation Encounter for details.  Called Summer and addressed results.

## 2020-07-22 ENCOUNTER — Telehealth: Payer: Self-pay

## 2020-07-22 ENCOUNTER — Encounter: Payer: Medicare Other | Admitting: Internal Medicine

## 2020-07-22 ENCOUNTER — Ambulatory Visit (INDEPENDENT_AMBULATORY_CARE_PROVIDER_SITE_OTHER): Payer: Medicare Other | Admitting: Pharmacist

## 2020-07-22 ENCOUNTER — Telehealth: Payer: Self-pay | Admitting: Interventional Cardiology

## 2020-07-22 DIAGNOSIS — I4891 Unspecified atrial fibrillation: Secondary | ICD-10-CM

## 2020-07-22 DIAGNOSIS — Z5181 Encounter for therapeutic drug level monitoring: Secondary | ICD-10-CM | POA: Diagnosis not present

## 2020-07-22 LAB — POCT INR: INR: 4.3 — AB (ref 2.0–3.0)

## 2020-07-22 NOTE — Telephone Encounter (Signed)
Spoke with pt, result addressed, see anticoagulation note in Epic.

## 2020-07-22 NOTE — Telephone Encounter (Signed)
° ° °  Pt is calling to give INR result. He would like to speak with a nurse

## 2020-07-22 NOTE — Telephone Encounter (Signed)
The pt wanted to know the results of his transmissions he sent today but we did not receive the transmission. I asked him to try again and if he receive the error code [id number] or [id number] then he needs to call [facility] tech support to get additional help.

## 2020-07-22 NOTE — Patient Instructions (Signed)
Description   Spoke with pt and advised him to skip today's dosage of Warfarin, then start taking 1/2 tablet daily except 1 tablet on Mondays and Fridays.  Recheck INR in 1 week. Amiodarone increased to BID on 06/26/20.   Pt's cell phone [phone] in case difficulty reaching pt on home phone.

## 2020-07-24 ENCOUNTER — Encounter: Payer: Medicare Other | Admitting: Physician Assistant

## 2020-07-28 ENCOUNTER — Ambulatory Visit (INDEPENDENT_AMBULATORY_CARE_PROVIDER_SITE_OTHER): Payer: Medicare Other

## 2020-07-28 DIAGNOSIS — Z95810 Presence of automatic (implantable) cardiac defibrillator: Secondary | ICD-10-CM | POA: Diagnosis not present

## 2020-07-28 DIAGNOSIS — I5022 Chronic systolic (congestive) heart failure: Secondary | ICD-10-CM

## 2020-07-30 NOTE — Progress Notes (Signed)
EPIC Encounter for ICM Monitoring  Patient Name: Austin [name] is a 80 y.o. male Date: 07/30/2020 Primary Care Physican: Merlene Laughter, MD Primary Cardiologist:Smith Electrophysiologist: Ladona Ridgel 1/5/2022Weight:198-199lbs  Clinical Status (25-Jun-2020 to 28-Jul-2020) Time in AT/AF 0.1 hr/day (0.5%) Longest AT/AF 36 minutes    Attempted call to patient and spoke with wife. She reports patient is feeling fine.  Transmission reviewed.   He was in [place] last week for vacation and eating foods higher in salt than normal which may contribute to decreased impedance.  OptivolThoracic impedancetrending slightly below baseline normal.  Prescribed:No diuretic (Per Dr Michaelle Copas 06/24/2020 OV note, loop diuretic discontinued but may need to be restarted if he has weight gain)  Labs: 06/19/2020 Creatinine 1.45, BUN 30, Potassium 5.0, Sodium 135, GFR 50 04/30/2020 Creatinine1.39, BUN23, Potassium4.5, Sodium137, SAY30-16 04/08/2020 Creatinine1.19, BUN22, Potassium4.2, Sodium136, WFU93-23  02/15/2020 Creatinine1.28, BUN21, Potassium4.4, Sodium136, FTD32-20 01/29/2020 Creatinine1.26, BUN25, Potassium4.3, [id number], JSE83-15 01/14/2020 Creatinine1.17, BUN19, Potassium4.0, Sodium140, VVO16-07  Acomplete set of results can be found in Results Review.  Recommendations: Advised to limit salt intake.    Follow-up plan: ICM clinic phone appointment on2/02/2021. 91 day device clinic remote transmission3/08/2020.   EP/Cardiology Office Visits:11/05/2020 with Dr.Smith. 08/07/2020 with Dr Ladona Ridgel  Copy of ICM check sent to Dr.Taylor.  3 month ICM trend: 07/28/2020.    1 Year ICM trend:       Karie Soda, RN 07/30/2020 12:44 PM

## 2020-07-31 ENCOUNTER — Ambulatory Visit (INDEPENDENT_AMBULATORY_CARE_PROVIDER_SITE_OTHER): Payer: Medicare Other

## 2020-07-31 DIAGNOSIS — I4891 Unspecified atrial fibrillation: Secondary | ICD-10-CM | POA: Diagnosis not present

## 2020-07-31 DIAGNOSIS — Z5181 Encounter for therapeutic drug level monitoring: Secondary | ICD-10-CM | POA: Diagnosis not present

## 2020-07-31 LAB — POCT INR: INR: 2.4 (ref 2.0–3.0)

## 2020-07-31 NOTE — Patient Instructions (Signed)
Description   Spoke with pt and advised him to Take 1 tablet today, then start taking 1/2 tablet daily except 1 tablet on Mondays, Wednesdays and Fridays.  Recheck INR in 1 week. Amiodarone increased to BID on 06/26/20.   Pt's cell phone [phone] in case difficulty reaching pt on home phone.

## 2020-08-07 ENCOUNTER — Ambulatory Visit (INDEPENDENT_AMBULATORY_CARE_PROVIDER_SITE_OTHER): Payer: Medicare Other | Admitting: *Deleted

## 2020-08-07 DIAGNOSIS — I4891 Unspecified atrial fibrillation: Secondary | ICD-10-CM | POA: Diagnosis not present

## 2020-08-07 DIAGNOSIS — Z5181 Encounter for therapeutic drug level monitoring: Secondary | ICD-10-CM | POA: Diagnosis not present

## 2020-08-07 LAB — POCT INR: INR: 3 (ref 2.0–3.0)

## 2020-08-07 NOTE — Patient Instructions (Signed)
Description   Spoke with pt and advised him to continue on same dosage 1/2 tablet daily except 1 tablet on Mondays, Wednesdays and Fridays.  Recheck INR in 2 weeks. Amiodarone increased to BID on 06/26/20.   Pt's cell phone [phone] in case difficulty reaching pt on home phone.     

## 2020-08-12 ENCOUNTER — Other Ambulatory Visit: Payer: Self-pay | Admitting: Interventional Cardiology

## 2020-08-12 ENCOUNTER — Encounter: Payer: Medicare Other | Admitting: Internal Medicine

## 2020-08-21 ENCOUNTER — Ambulatory Visit (INDEPENDENT_AMBULATORY_CARE_PROVIDER_SITE_OTHER): Payer: Medicare Other | Admitting: *Deleted

## 2020-08-21 DIAGNOSIS — Z5181 Encounter for therapeutic drug level monitoring: Secondary | ICD-10-CM

## 2020-08-21 DIAGNOSIS — I4891 Unspecified atrial fibrillation: Secondary | ICD-10-CM

## 2020-08-21 LAB — POCT INR: INR: 3.4 — AB (ref 2.0–3.0)

## 2020-08-21 NOTE — Patient Instructions (Signed)
Description   Spoke with pt and advised him to continue on same dosage 1/2 tablet daily except 1 tablet on Mondays, Wednesdays and Fridays.  Recheck INR in 2 weeks. Amiodarone increased to BID on 06/26/20.   Pt's cell phone [phone] in case difficulty reaching pt on home phone.     

## 2020-08-26 DIAGNOSIS — L812 Freckles: Secondary | ICD-10-CM | POA: Diagnosis not present

## 2020-08-26 DIAGNOSIS — C44722 Squamous cell carcinoma of skin of right lower limb, including hip: Secondary | ICD-10-CM | POA: Diagnosis not present

## 2020-08-26 DIAGNOSIS — L570 Actinic keratosis: Secondary | ICD-10-CM | POA: Diagnosis not present

## 2020-08-26 DIAGNOSIS — D485 Neoplasm of uncertain behavior of skin: Secondary | ICD-10-CM | POA: Diagnosis not present

## 2020-08-26 DIAGNOSIS — Z85828 Personal history of other malignant neoplasm of skin: Secondary | ICD-10-CM | POA: Diagnosis not present

## 2020-08-26 DIAGNOSIS — C44729 Squamous cell carcinoma of skin of left lower limb, including hip: Secondary | ICD-10-CM | POA: Diagnosis not present

## 2020-09-02 ENCOUNTER — Ambulatory Visit (INDEPENDENT_AMBULATORY_CARE_PROVIDER_SITE_OTHER): Payer: Medicare Other

## 2020-09-02 DIAGNOSIS — I5022 Chronic systolic (congestive) heart failure: Secondary | ICD-10-CM

## 2020-09-02 DIAGNOSIS — Z95810 Presence of automatic (implantable) cardiac defibrillator: Secondary | ICD-10-CM

## 2020-09-04 ENCOUNTER — Ambulatory Visit (INDEPENDENT_AMBULATORY_CARE_PROVIDER_SITE_OTHER): Payer: Medicare Other | Admitting: *Deleted

## 2020-09-04 DIAGNOSIS — Z952 Presence of prosthetic heart valve: Secondary | ICD-10-CM | POA: Diagnosis not present

## 2020-09-04 DIAGNOSIS — I4891 Unspecified atrial fibrillation: Secondary | ICD-10-CM | POA: Diagnosis not present

## 2020-09-04 DIAGNOSIS — Z5181 Encounter for therapeutic drug level monitoring: Secondary | ICD-10-CM | POA: Diagnosis not present

## 2020-09-04 LAB — POCT INR: INR: 3.5 — AB (ref 2.0–3.0)

## 2020-09-04 NOTE — Patient Instructions (Signed)
Description   Spoke with pt and advised him to continue on same dosage 1/2 tablet daily except 1 tablet on Mondays, Wednesdays and Fridays.  Recheck INR in 2 weeks. Amiodarone increased to BID on 06/26/20.   Pt's cell phone [phone] in case difficulty reaching pt on home phone.

## 2020-09-09 NOTE — Progress Notes (Signed)
EPIC Encounter for ICM Monitoring  Patient Name: Austin [name] is a 80 y.o. male Date: 09/09/2020 Primary Care Physican: Lajean Manes, MD Primary Cardiologist:Smith Electrophysiologist: Lovena Le 1/5/2022Weight:198-199lbs  Clinical Status (28-Jul-2020 to 02-Sep-2020) AT/AF 967 Time in AT/AF 6.6 hr/day (27.4%) Pt takes Warfarin Longest AT/AF 63 minutes    Transmission reviewed.  OptivolThoracic impedancesuggesting normal fluid levels.  Message sent to device clinic triage regarding AT/AF Burden from 0.5% (07/28/20 report) to 27.4% on 09/02/20  Prescribed:No diuretic (Per Dr Thompson Caul 06/24/2020 OV note, loop diuretic discontinued but may need to be restarted if he has weight gain) Spironolactone 25 mg take 0.5 tablet (12.5 mg total) every Mon, Wed, and Friday.  Labs: 06/19/2020 Creatinine 1.45, BUN 30, Potassium 5.0, Sodium 135, GFR 50 04/30/2020 Creatinine1.39, BUN23, Potassium4.5, [id number], [id number] 04/08/2020 Creatinine1.19, BUN22, Potassium4.2, Sodium136, [id number]  02/15/2020 Creatinine1.28, BUN21, Potassium4.4, Sodium136, FPO25-18 01/29/2020 Creatinine1.26, BUN25, Potassium4.3, Sodium135, FQM21-03 01/14/2020 Creatinine1.17, BUN19, Potassium4.0, Sodium140, [id number]  Acomplete set of results can be found in Results Review.  Recommendations: Patient has office visit with Dr Lovena Le on 09/11/2020   Follow-up plan: ICM clinic phone appointment on3/14/2022. 91 day device clinic remote transmission3/08/2020.   EP/Cardiology Office Visits:11/05/2020 with [place].  09/11/2020 with Dr Lovena Le  Copy of ICM check sent to Dr.Taylor.   3 month ICM trend: 09/02/2020.    1 Year ICM trend:       [name], RN 09/09/2020 9:12 AM

## 2020-09-11 ENCOUNTER — Ambulatory Visit: Payer: Medicare Other | Admitting: Internal Medicine

## 2020-09-11 ENCOUNTER — Encounter: Payer: Self-pay | Admitting: Internal Medicine

## 2020-09-11 ENCOUNTER — Other Ambulatory Visit: Payer: Self-pay

## 2020-09-11 VITALS — BP 106/68 | HR 84 | Ht 73.0 in | Wt 206.4 lb

## 2020-09-11 DIAGNOSIS — I472 Ventricular tachycardia, unspecified: Secondary | ICD-10-CM

## 2020-09-11 DIAGNOSIS — I4819 Other persistent atrial fibrillation: Secondary | ICD-10-CM | POA: Diagnosis not present

## 2020-09-11 DIAGNOSIS — Z95810 Presence of automatic (implantable) cardiac defibrillator: Secondary | ICD-10-CM | POA: Diagnosis not present

## 2020-09-11 DIAGNOSIS — I5022 Chronic systolic (congestive) heart failure: Secondary | ICD-10-CM | POA: Diagnosis not present

## 2020-09-11 MED ORDER — AMIODARONE HCL 200 MG PO TABS: ORAL_TABLET | ORAL | 3 refills | Status: DC

## 2020-09-11 NOTE — Progress Notes (Signed)
HPI Austin [name] returns today for followup of VT, atrial fib, CHB, chronic systolic heart failure and ICD followup. He is a pleasant man with the above problems. Who was seen in November with more VT. He had his dose of amio increased. He has been placed under maximal medical therapy and thinks that he feels better. His EF improved. He has not had a cough. No recurrent VT.  Allergies  Allergen Reactions  . Zetia [Ezetimibe]     Pt stated, "Upset my stomach"     Current Outpatient Medications  Medication Sig Dispense Refill  . acetaminophen (TYLENOL) 500 MG tablet Take 1,000 mg by mouth every 6 (six) hours as needed (pain).    Marland Kitchen amiodarone (PACERONE) 200 MG tablet Take 1 tablet (200 mg total) by mouth 2 (two) times daily. 60 tablet 3  . amoxicillin (AMOXIL) 500 MG capsule TAKE 4 CAPSULES BY MOUTH 30 MINUTES PRIOR TO DENTAL WORK 4 capsule 2  . cephALEXin (KEFLEX) 500 MG capsule Take by mouth.     . dapagliflozin propanediol (FARXIGA) 5 MG TABS tablet Take 1 tablet (5 mg total) by mouth daily before breakfast. 30 tablet 11  . levothyroxine (SYNTHROID) 50 MCG tablet TAKE 1 TABLET BY MOUTH DAILY BEFORE BREAKFAST 90 tablet 2  . metoprolol succinate (TOPROL XL) 25 MG 24 hr tablet Take 0.5 tablets (12.5 mg total) by mouth daily. 15 tablet 11  . Multiple Vitamin (MULTIVITAMIN WITH MINERALS) TABS tablet Take 1 tablet by mouth daily.    . nitroGLYCERIN (NITROSTAT) 0.4 MG SL tablet Place 1 tablet (0.4 mg total) under the tongue every 5 (five) minutes x 3 doses as needed for chest pain. 25 tablet 2  . sacubitril-valsartan (ENTRESTO) 24-26 MG Take 1 tablet by mouth 2 (two) times daily. 60 tablet 11  . spironolactone (ALDACTONE) 25 MG tablet Take 0.5 tablets (12.5 mg total) by mouth daily. (Patient taking differently: Take 12.5 mg by mouth every Monday, Wednesday, and Friday.) 45 tablet 3  . warfarin (COUMADIN) 5 MG tablet TAKE AS DIRECTED BY THE COUMADIN CLINIC 90 tablet 0   No current  facility-administered medications for this visit.     Past Medical History:  Diagnosis Date  . AICD (automatic cardioverter/defibrillator) present   . Atrial fibrillation ([place])   . 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 Conductive hearing loss   . Intraspinal abscess   . 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 Nonischemic cardiomyopathy ([place])   . Psychosexual dysfunction with inhibited sexual excitement   . S/P mitral valve replacement   . Syncope and collapse   . Unspecified sleep apnea    last sleep study 11/07  . Ventricular tachycardia ([place])   . VT (ventricular tachycardia) ([facility])     ROS:   All systems reviewed and negative except as noted in the HPI.   Past Surgical History:  Procedure Laterality Date  . BIV ICD GENERATOR CHANGEOUT N/A 05/04/2017   Procedure: BiV ICD Generator Changeout;  Surgeon: Evans Lance, MD;  Location: [place] [facility];  Service: Cardiovascular;  Laterality: N/A;  . CARDIAC CATHETERIZATION  04/24/2002  . CARDIAC VALVE REPLACEMENT    . CARDIOVERSION  03/09/2012   Procedure: CARDIOVERSION;  Surgeon: Evans Lance, MD;  Location: [place];  Service: Cardiovascular;  Laterality: N/A;  . CARDIOVERSION N/A 11/22/2013   Procedure: CARDIOVERSION;  Surgeon: Dani Gobble  Croitoru, MD;  Location: [place];  Service: Cardiovascular;  Laterality: N/A;  . dental implants    . ELECTROPHYSIOLOGIC STUDY N/A 09/15/2015   Procedure: V Tach Ablation;  Surgeon: Evans Lance, MD;  Location: [place] [facility];  Service: Cardiovascular;  Laterality: N/A;  . Evacution of epidural lumbar epidural abscess  1999  . INSERT / REPLACE / REMOVE PACEMAKER  11/2008  . MITRAL VALVE REPLACEMENT     w #[address] [facility] [facility]  . PERMANENT PACEMAKER INSERTION N/A 08/05/2011   Procedure: PERMANENT PACEMAKER INSERTION;  Surgeon: Evans Lance, MD;  Location: [facility]  CATH LAB;  Service: Cardiovascular;  Laterality: N/A;  . THYROIDECTOMY    . TONSILLECTOMY    . VALVE REPLACEMENT  2000     Family History  Problem Relation Age of Onset  . Heart disease Mother   . Heart failure Mother   . Heart disease Father   . Heart failure Father      Social History   Socioeconomic History  . Marital status: Married    Spouse name: Not on file  . Number of children: 1  . Years of education: 65  . Highest education level: Master's degree (e.g., MA, MS, MEng, MEd, MSW, MBA)  Occupational History  . Occupation: retired from [employer], manufacturing systems  Tobacco Use  . Smoking status: Never Smoker  . Smokeless tobacco: Never Used  Vaping Use  . Vaping Use: Never used  Substance and Sexual Activity  . Alcohol use: Yes    Alcohol/week: 0.0 standard drinks    Comment: occasionally  . Drug use: No  . Sexual activity: Not on file  Other Topics Concern  . Not on file  Social History Narrative   Lives with wife in a one story home.  Has one daughter.  Retired.  Education: Masters.   Social Determinants of Health   Financial Resource Strain: Not on file  Food Insecurity: Not on file  Transportation Needs: Not on file  Physical Activity: Not on file  Stress: Not on file  Social Connections: Not on file  Intimate Partner Violence: Not on file     BP 106/68   Pulse 84   Ht 6\' 1"  (1.854 m)   Wt 206 lb 6.4 oz (93.6 kg)   SpO2 93%   BMI 27.23 kg/m   Physical Exam:  Well appearing NAD HEENT: Unremarkable Neck:  No JVD, no thyromegally Lymphatics:  No adenopathy Back:  No CVA tenderness Lungs:  Clear with no wheezes HEART:  Regular rate rhythm, no murmurs, no rubs, no clicks Abd:  soft, positive bowel sounds, no organomegally, no rebound, no guarding Ext:  2 plus pulses, no edema, no cyanosis, no clubbing Skin:  No rashes no nodules Neuro:  CN II through XII intact, motor grossly intact  EKG - atrial fib with ventricular pacing  DEVICE  Normal  device function.  See PaceArt for details.   Assess/Plan: 1. Atrial fib/flutter - his rates are well controlled. He has left atrial flutter. I tride to pace him back to rhythm but his atrial flutter was replaced by atrial fib. 2. 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 I do not appreciate MR or MS on exam.  3. 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 He is pacing at 60/min. His LV lead is off due to very high thresholds. 4. VT - he has had no more VT since  November. I have asked him to reduce his dose of amiodarone to 200 mg bid Mon-Fri and 200 mg Sat/Sun.  Carleene Overlie Taylor,MD

## 2020-09-11 NOTE — Patient Instructions (Addendum)
Medication Instructions:  Your physician has recommended you make the following change in your medication:   1.  REDUCE your amiodarone 200 mg-  Take one tablet by mouth TWICE a day Monday through Friday.  On Saturday and Sunday TAKE ONLY ONE tablet a day.  Labwork: None ordered.  Testing/Procedures: None ordered.  Follow-Up: Your physician wants you to follow-up in: 5 months with [name], MD.   Remote monitoring is used to monitor your ICD from home. This monitoring reduces the number of office visits required to check your device to one time per year. It allows [place] to keep an eye on the functioning of your device to ensure it is working properly. You are scheduled for a device check from home on 09/24/2020. You may send your transmission at any time that day. If you have a wireless device, the transmission will be sent automatically. After your physician reviews your transmission, you will receive a postcard with your next transmission date.  Any Other Special Instructions Will Be Listed Below (If Applicable).  If you need a refill on your cardiac medications before your next appointment, please call your pharmacy.

## 2020-09-18 ENCOUNTER — Ambulatory Visit (INDEPENDENT_AMBULATORY_CARE_PROVIDER_SITE_OTHER): Payer: Medicare Other | Admitting: Interventional Cardiology

## 2020-09-18 DIAGNOSIS — Z5181 Encounter for therapeutic drug level monitoring: Secondary | ICD-10-CM

## 2020-09-18 LAB — POCT INR: INR: 3.1 — AB (ref 2.0–3.0)

## 2020-09-18 NOTE — Patient Instructions (Signed)
Description   Spoke with pt and advised him to continue on same dosage of warfarin 1/2 tablet daily except 1 tablet on Mondays, Wednesdays and Fridays.  Recheck INR in 2 weeks. Amiodarone increased to BID on 06/26/20.   Pt's cell phone [phone] in case difficulty reaching pt on home phone.

## 2020-09-21 ENCOUNTER — Other Ambulatory Visit: Payer: Self-pay | Admitting: Interventional Cardiology

## 2020-09-21 DIAGNOSIS — I472 Ventricular tachycardia, unspecified: Secondary | ICD-10-CM

## 2020-09-22 NOTE — Telephone Encounter (Signed)
Outpatient Medication Detail   Disp Refills Start End   amiodarone (PACERONE) 200 MG tablet 180 tablet 3 09/11/2020    Sig: Take one tablet by mouth twice a day Monday through Friday.  Take only one tablet by mouth on Saturday and Sunday.   Sent to pharmacy as: amiodarone (PACERONE) 200 MG tablet   E-Prescribing Status: Receipt confirmed by pharmacy (09/11/2020  2:18 PM EST)     Pharmacy  [id number] - [place], [address] AT [place]

## 2020-09-24 ENCOUNTER — Ambulatory Visit (INDEPENDENT_AMBULATORY_CARE_PROVIDER_SITE_OTHER): Payer: Medicare Other

## 2020-09-24 DIAGNOSIS — I472 Ventricular tachycardia, unspecified: Secondary | ICD-10-CM

## 2020-09-26 LAB — CUP PACEART REMOTE DEVICE CHECK
Battery Remaining Longevity: 67 mo
Battery Voltage: 2.94 V
Brady Statistic RA Percent Paced: 6.91 %
Brady Statistic RV Percent Paced: 93.35 %
Date Time Interrogation Session: 20220303042206
HighPow Impedance: 76 Ohm
Implantable Lead Implant Date: 20130110
Implantable Lead Implant Date: 20130110
Implantable Lead Location: 753859
Implantable Lead Location: 753860
Implantable Lead Model: 5076
Implantable Lead Model: 6935
Implantable Pulse Generator Implant Date: 20181010
Lead Channel Impedance Value: 304 Ohm
Lead Channel Impedance Value: 399 Ohm
Lead Channel Impedance Value: 399 Ohm
Lead Channel Pacing Threshold Amplitude: 0.875 V
Lead Channel Pacing Threshold Pulse Width: 0.4 ms
Lead Channel Sensing Intrinsic Amplitude: 0.5 mV
Lead Channel Sensing Intrinsic Amplitude: 0.5 mV
Lead Channel Sensing Intrinsic Amplitude: 5.5 mV
Lead Channel Sensing Intrinsic Amplitude: 5.5 mV
Lead Channel Setting Pacing Amplitude: 2.5 V
Lead Channel Setting Pacing Amplitude: 3.5 V
Lead Channel Setting Pacing Pulse Width: 0.4 ms
Lead Channel Setting Sensing Sensitivity: 0.3 mV

## 2020-10-02 ENCOUNTER — Ambulatory Visit (INDEPENDENT_AMBULATORY_CARE_PROVIDER_SITE_OTHER): Payer: Medicare Other

## 2020-10-02 DIAGNOSIS — I4891 Unspecified atrial fibrillation: Secondary | ICD-10-CM | POA: Diagnosis not present

## 2020-10-02 DIAGNOSIS — Z5181 Encounter for therapeutic drug level monitoring: Secondary | ICD-10-CM

## 2020-10-02 LAB — POCT INR: INR: 3.8 — AB (ref 2.0–3.0)

## 2020-10-02 NOTE — Progress Notes (Signed)
Remote ICD transmission.   

## 2020-10-02 NOTE — Patient Instructions (Signed)
Description   Spoke with pt and advised him to take 1/2 tablet today and tomorrow, then resume same dosage of warfarin 1/2 tablet daily except 1 tablet on Mondays, Wednesdays and Fridays.  Recheck INR in 2 weeks. Amiodarone increased to BID on 06/26/20.   Pt's cell phone [phone] in case difficulty reaching pt on home phone.

## 2020-10-06 ENCOUNTER — Ambulatory Visit (INDEPENDENT_AMBULATORY_CARE_PROVIDER_SITE_OTHER): Payer: Medicare Other

## 2020-10-06 DIAGNOSIS — Z95810 Presence of automatic (implantable) cardiac defibrillator: Secondary | ICD-10-CM

## 2020-10-06 DIAGNOSIS — I5022 Chronic systolic (congestive) heart failure: Secondary | ICD-10-CM

## 2020-10-10 NOTE — Progress Notes (Signed)
EPIC Encounter for ICM Monitoring  Patient Name: Austin [name] is a 80 y.o. male Date: 10/10/2020 Primary Care Physican: Lajean Manes, MD Primary Cardiologist:Smith Electrophysiologist: Lovena Le 10/10/2020 Weight:201lbs  Clinical Status (25-Sep-2020 to 06-Oct-2020) AT/AF 129 episodes  Time in AT/AF 22.4 hr/day (93.3%) taking Warfarin     Spoke with patient and reports feeling well at this time.  Denies fluid symptoms.    OptivolThoracic impedancesuggesting normal fluid levels.  Message sent to device clinic triage 10/10/2020 regarding AT/AF Burden from 27.4% (09/02/20 report) to 93.3% on 10/06/20.  Prescribed:No diuretic (Per Dr Thompson Caul 06/24/2020 OV note, loop diuretic discontinued but may need to be restarted if he has weight gain) Spironolactone 25 mg take 0.5 tablet (12.5 mg total) every Mon, Wed, and Friday.  Labs: 06/19/2020 Creatinine 1.45, BUN 30, Potassium 5.0, Sodium 135, GFR 50 04/30/2020 Creatinine1.39, BUN23, Potassium4.5, [id number], [id number] 04/08/2020 Creatinine1.19, BUN22, Potassium4.2, Sodium136, [id number]  02/15/2020 Creatinine1.28, BUN21, Potassium4.4, Sodium136, [id number] 01/29/2020 Creatinine1.26, BUN25, Potassium4.3, Sodium135, [id number] 01/14/2020 Creatinine1.17, BUN19, Potassium4.0, Sodium140, [id number]  Acomplete set of results can be found in Results Review.  Recommendations: No changes and encouraged to call if experiencing any fluid symptoms.  Follow-up plan: ICM clinic phone appointment on4/18/2022. 91 day device clinic remote transmission6/07/2020.   EP/Cardiology Office Visits:4/13/2022with Dr.Smith.  Recall 8/22/2022with Dr Lovena Le  Copy of ICM check sent to Dr.Taylor.   3 month ICM trend: 10/06/2020.    1 Year ICM trend:       [name], RN 10/10/2020 8:28 AM

## 2020-10-13 DIAGNOSIS — G4733 Obstructive sleep apnea (adult) (pediatric): Secondary | ICD-10-CM | POA: Diagnosis not present

## 2020-10-15 ENCOUNTER — Ambulatory Visit (HOSPITAL_COMMUNITY)
Admission: RE | Admit: 2020-10-15 | Discharge: 2020-10-15 | Disposition: A | Discharge status: Home / Self Care | Payer: Medicare Other | Source: Ambulatory Visit | Attending: Vascular Surgery | Admitting: Vascular Surgery

## 2020-10-15 ENCOUNTER — Other Ambulatory Visit: Payer: Self-pay

## 2020-10-15 ENCOUNTER — Ambulatory Visit: Payer: Medicare Other | Admitting: Physician Assistant

## 2020-10-15 VITALS — BP 107/70 | HR 63 | Temp 97.4°F | Resp 18 | Ht 73.5 in | Wt 203.0 lb

## 2020-10-15 DIAGNOSIS — I739 Peripheral vascular disease, unspecified: Secondary | ICD-10-CM | POA: Insufficient documentation

## 2020-10-15 NOTE — Progress Notes (Signed)
HISTORY AND PHYSICAL     CC:  follow up. Requesting Provider:  Lajean Manes, MD  HPI: This is a 80 y.o. male who is here today for follow up for PAD.  He was originally seen by Dr. Oneida Alar in August 2021 after he underwent removal of a left first toenail and it failed to heal.  He previously had a toenail removed from the right first toe which did heal spontaneously.  Other medical problems include prior history of atrial fibrillation and V. tach.  He does have an AICD.  He is also had mitral valve replacement and is on chronic warfarin for this.  He is a non-smoker.  Pt was last seen by Dr. Oneida Alar in September 2021 and at that time, his wound had completely healed. 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  He recommended return in 6 months with repeat ABI.  He felt we could consider arteriogram if he is in a limb threatening situation rather than for claudication symptoms alone.  The pt returns today for that visit.  He states that his walking is limited.  He does get some cramping in his calves but feels his balance and lightheadedness limits him more.  He states that his EF had gotten down to 15% and they started him on [place] and this has helped and EF is up to 40%.  He states he took a trip to [facility] in December and struggled getting through the airport having to stop and rest about every 100 yards.  More so due to his lightheadedness.  He denies any non healing wounds or rest pain.  He states he does have some numbness in his feet and was taking gabapentin for this but did not feel it was really helping him.  He states that he gets some numbness in his feet when he lays on his side but not really on his back.  He wiggles his foot and it improves.    Pt went to school at [employer].  The pt is not on a statin for cholesterol management.    The pt is not on an aspirin.    Other AC:  Coumadin The pt is on ARB, BB for hypertension.  The pt does not have  diabetes. Tobacco hx:  never  Pt does not have family hx of AAA.  Past Medical History:  Diagnosis Date  . AICD (automatic cardioverter/defibrillator) present   . Atrial fibrillation ([place])   . 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 Conductive hearing loss   . Intraspinal abscess   . 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 Nonischemic cardiomyopathy ([place])   . Psychosexual dysfunction with inhibited sexual excitement   . S/P mitral valve replacement   . Syncope and collapse   . Unspecified sleep apnea    last sleep study 11/07  . Ventricular tachycardia ([place])   . VT (ventricular tachycardia) ([place])     Past Surgical History:  Procedure Laterality Date  . BIV ICD GENERATOR CHANGEOUT N/A 05/04/2017   Procedure: BiV ICD Generator Changeout;  Surgeon: Evans Lance, MD;  Location: [place] [facility];  Service: Cardiovascular;  Laterality: N/A;  . CARDIAC CATHETERIZATION  04/24/2002  . CARDIAC VALVE REPLACEMENT    . CARDIOVERSION  03/09/2012   Procedure: CARDIOVERSION;  Surgeon: Evans Lance, MD;  Location: [place];  Service: Cardiovascular;  Laterality: N/A;  . CARDIOVERSION N/A 11/22/2013   Procedure: CARDIOVERSION;  Surgeon: Sanda Klein, MD;  Location: [facility];  Service: Cardiovascular;  Laterality: N/A;  . dental implants    . ELECTROPHYSIOLOGIC STUDY N/A 09/15/2015   Procedure: V Tach Ablation;  Surgeon: Evans Lance, MD;  Location: [place] [facility];  Service: Cardiovascular;  Laterality: N/A;  . Evacution of epidural lumbar epidural abscess  1999  . INSERT / REPLACE / REMOVE PACEMAKER  11/2008  . MITRAL VALVE REPLACEMENT     w #[address] [facility]  . PERMANENT PACEMAKER INSERTION N/A 08/05/2011   Procedure: PERMANENT PACEMAKER INSERTION;  Surgeon: Evans Lance, MD;  [facility]: [facility] C[facility] LAB;  Service: Cardiovascular;  Laterality: N/A;  .  THYROIDECTOMY    . TONSILLECTOMY    . VALVE REPLACEMENT  2000    Allergies  Allergen Reactions  . Zetia [Ezetimibe]     Pt stated, "Upset my stomach"    Current Outpatient Medications  Medication Sig Dispense Refill  . acetaminophen (TYLENOL) 500 MG tablet Take 1,000 mg by mouth every 6 (six) hours as needed (pain).    Marland Kitchen amiodarone (PACERONE) 200 MG tablet Take one tablet by mouth twice a day Monday through Friday.  Take only one tablet by mouth on Saturday and Sunday. 180 tablet 3  . amoxicillin (AMOXIL) 500 MG capsule TAKE 4 CAPSULES BY MOUTH 30 MINUTES PRIOR TO DENTAL WORK 4 capsule 2  . cephALEXin (KEFLEX) 500 MG capsule Take by mouth.     . dapagliflozin propanediol (FARXIGA) 5 MG TABS tablet Take 1 tablet (5 mg total) by mouth daily before breakfast. 30 tablet 11  . levothyroxine (SYNTHROID) 50 MCG tablet TAKE 1 TABLET BY MOUTH DAILY BEFORE BREAKFAST 90 tablet 2  . metoprolol succinate (TOPROL XL) 25 MG 24 hr tablet Take 0.5 tablets (12.5 mg total) by mouth daily. 15 tablet 11  . Multiple Vitamin (MULTIVITAMIN WITH MINERALS) TABS tablet Take 1 tablet by mouth daily.    . nitroGLYCERIN (NITROSTAT) 0.4 MG SL tablet Place 1 tablet (0.4 mg total) under the tongue every 5 (five) minutes x 3 doses as needed for chest pain. 25 tablet 2  . sacubitril-valsartan (ENTRESTO) 24-26 MG Take 1 tablet by mouth 2 (two) times daily. 60 tablet 11  . spironolactone (ALDACTONE) 25 MG tablet Take 0.5 tablets (12.5 mg total) by mouth daily. (Patient taking differently: Take 12.5 mg by mouth every Monday, Wednesday, and Friday.) 45 tablet 3  . warfarin (COUMADIN) 5 MG tablet TAKE AS DIRECTED BY THE COUMADIN CLINIC 90 tablet 0   No current facility-administered medications for this visit.    Family History  Problem Relation Age of Onset  . Heart disease Mother   . Heart failure Mother   . Heart disease Father   . Heart failure Father     Social History   Socioeconomic History  . Marital status:  Married    Spouse name: Not on file  . Number of children: 1  . Years of education: 62  . Highest education level: Master's degree (e.g., MA, MS, MEng, MEd, MSW, MBA)  Occupational History  . Occupation: retired from [employer], manufacturing systems  Tobacco Use  . Smoking status: Never Smoker  . Smokeless tobacco: Never Used  Vaping Use  . Vaping Use: Never used  Substance and Sexual Activity  . Alcohol use: Yes    Alcohol/week: 0.0 standard drinks    Comment: occasionally  . Drug use: No  . Sexual  activity: Not on file  Other Topics Concern  . Not on file  Social History Narrative   Lives with wife in a one story home.  Has one daughter.  Retired.  Education: Masters.   Social Determinants of Health   Financial Resource Strain: Not on file  Food Insecurity: Not on file  Transportation Needs: Not on file  Physical Activity: Not on file  Stress: Not on file  Social Connections: Not on file  Intimate Partner Violence: Not on file     REVIEW OF SYSTEMS:   [X]  denotes positive finding, [ ]  denotes negative finding Cardiac  Comments:  Chest pain or chest pressure:    Shortness of breath upon exertion:    Short of breath when lying flat:    Irregular heart rhythm: x       Vascular    Pain in calf, thigh, or hip brought on by ambulation: x   Pain in feet at night that wakes you up from your sleep:     Blood clot in your veins:    Leg swelling:         Pulmonary    Oxygen at home:    Productive cough:     Wheezing:         Neurologic    Sudden weakness in arms or legs:     Sudden numbness in arms or legs:     Sudden onset of difficulty speaking or slurred speech:    Temporary loss of vision in one eye:     Problems with dizziness:         Gastrointestinal    Blood in stool:     Vomited blood:         Genitourinary    Burning when urinating:     Blood in urine:        Psychiatric    Major depression:         Hematologic    Bleeding problems:    Problems with blood  clotting too easily:        Skin    Rashes or ulcers:        Constitutional    Fever or chills:      PHYSICAL EXAMINATION:  Today's Vitals   10/15/20 1412  BP: 107/70  Pulse: 63  Resp: 18  Temp: (!) 97.4 F (36.3 C)  TempSrc: Temporal  SpO2: 96%  Weight: 203 lb (92.1 kg)  Height: 6' 1.5" (1.867 m)  PainSc: 6    Body mass index is 26.42 kg/m.   General:  WDWN in NAD; vital signs documented above Gait: Not observed HENT: WNL, normocephalic Pulmonary: normal non-labored breathing , without wheezing Cardiac: regular HR, without  Murmur; without carotid bruits Abdomen: soft, NT, no masses; aortic pulse is not palpable Skin: without rashes Vascular Exam/Pulses:  Right Left  Radial 2+ (normal) 2+ (normal)  Femoral Difficult to palpate  Difficult to palpate  Popliteal Unable to palpate Unable to palpate  DP ,monophasic monophasic  PT Brisk monophasic Brisk monophasic  peroneal Faint monophasic monophasic   Extremities: without ischemic changes, without Gangrene , without cellulitis; without open wounds; hemosiderin staining BLE; minimal swelling BLE Musculoskeletal: no muscle wasting or atrophy  Neurologic: A&O X 3;  No focal weakness or paresthesias are detected Psychiatric:  The pt has Normal affect.   Non-Invasive Vascular Imaging:   ABI's/TBI's on 10/15/2020: Right:  [place]/0.91 - Great toe pressure: 89 Left:  [place]/0.58 - Great toe pressure: 57  Previous ABI's/TBI's  on 02/29/2020: Right:  [place]/0.64 - Great toe pressure: 58 Left:  [place]/0.50 - Great toe pressure:  45   ASSESSMENT/PLAN:: 80 y.o. male here for follow up for PAD  PAD -pt with tibial occlusive disease.  He was able to heal his wound from toenail removal.  His ABI are [place], but his toe pressures are improved from last check.  He has increased his walking on the treadmill.  From his information, his walking is more limited by balance and light headedness than claudication.  I encouraged him to safely continue to  increase his walking program. -when Dr. Oneida Alar saw him in September, he felt that we would only consider arteriogram if he is in a limb threatening situation rather than for claudication symptoms alone. -would recommend statin if of with cardiologist.  -pt will f/u in one year with ABI.  I discussed with him that if he develops any non healing wounds, worsening claudication or rest pain, to call us sooner.  He expressed understanding.   Leontine Locket, [facility] [phone]  Clinic MD:   Scot Dock

## 2020-10-16 ENCOUNTER — Ambulatory Visit (INDEPENDENT_AMBULATORY_CARE_PROVIDER_SITE_OTHER): Payer: Medicare Other | Admitting: *Deleted

## 2020-10-16 DIAGNOSIS — I4891 Unspecified atrial fibrillation: Secondary | ICD-10-CM

## 2020-10-16 DIAGNOSIS — Z5181 Encounter for therapeutic drug level monitoring: Secondary | ICD-10-CM

## 2020-10-16 LAB — POCT INR: INR: 3.9 — AB (ref 2.0–3.0)

## 2020-10-30 ENCOUNTER — Ambulatory Visit (INDEPENDENT_AMBULATORY_CARE_PROVIDER_SITE_OTHER): Payer: Medicare Other

## 2020-10-30 DIAGNOSIS — Z952 Presence of prosthetic heart valve: Secondary | ICD-10-CM | POA: Diagnosis not present

## 2020-10-30 DIAGNOSIS — Z5181 Encounter for therapeutic drug level monitoring: Secondary | ICD-10-CM

## 2020-10-30 DIAGNOSIS — I4891 Unspecified atrial fibrillation: Secondary | ICD-10-CM | POA: Diagnosis not present

## 2020-10-30 LAB — POCT INR: INR: 2.8 (ref 2.0–3.0)

## 2020-10-30 NOTE — Patient Instructions (Signed)
Description   Spoke with pt and advised him to take 1 tablet today, then resume same dosage of Warfarin 1/2 tablet daily except 1 tablet on Mondays, Wednesdays and Fridays.  Recheck INR in 2 weeks. Amiodarone increased to BID on 06/26/20.   Pt's cell phone [phone] in case difficulty reaching pt on home phone.

## 2020-11-03 ENCOUNTER — Other Ambulatory Visit: Payer: Self-pay | Admitting: Interventional Cardiology

## 2020-11-04 NOTE — Progress Notes (Signed)
Cardiology Office Note:    Date:  11/05/2020   ID:  Austin [name], DOB Nov 12, 1940, MRN [id number]  PCP:  Austin Manes, MD  Cardiologist:  Austin Grooms, MD   Referring MD: Austin Manes, MD   Chief Complaint  Patient presents with  . Atrial Fibrillation  . Congestive Heart Failure  . Coronary Artery Disease  . Cardiac Valve Problem    History of Present Illness:    Austin [name] is a 80 y.o. male with a hx of mechanical mitral valve, chronic anticoagulation therapy, nonischemic cardiomyopathy, biventricular implantable cardiac defibrillator, recurrent ventricular tachycardia,amiodarone for VT suppression,multiple medication regimen to suppress ventricular ectopy, and sleep apnea.Echo performed June 2021 revealed EF of 20 to 42% and systolic heart failure management titrated/added.  Paroxysmal atrial fibrillation 06/19/2020 treated with electrical cardioversion and increase amiodarone loading.  Most recent echo 11/24 reveals improve EF to 35% compared to June.  [name] feels that the combination therapy for heart failure has markedly improved his quality of life.  He is no longer "foggy headed", fatigued, and as dyspneic.  He has not had syncope.  He denies palpitations.  No bleeding.  Past Medical History:  Diagnosis Date  . AICD (automatic cardioverter/defibrillator) present   . Atrial fibrillation ([place])   . 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 Conductive hearing loss   . Intraspinal abscess   . 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 Nonischemic cardiomyopathy ([place])   . Psychosexual dysfunction with inhibited sexual excitement   . S/P mitral valve replacement   . Syncope and collapse   . Unspecified sleep apnea    last sleep study 11/07  . Ventricular tachycardia ([place])   . VT (ventricular tachycardia) ([place])     Past Surgical  History:  Procedure Laterality Date  . BIV ICD GENERATOR CHANGEOUT N/A 05/04/2017   Procedure: BiV ICD Generator Changeout;  Surgeon: Evans Lance, MD;  Location: [place] [facility];  Service: Cardiovascular;  Laterality: N/A;  . CARDIAC CATHETERIZATION  04/24/2002  . CARDIAC VALVE REPLACEMENT    . CARDIOVERSION  03/09/2012   Procedure: CARDIOVERSION;  Surgeon: Evans Lance, MD;  Location: [place];  Service: Cardiovascular;  Laterality: N/A;  . CARDIOVERSION N/A 11/22/2013   Procedure: CARDIOVERSION;  Surgeon: Sanda Klein, MD;  Location: [facility];  Service: Cardiovascular;  Laterality: N/A;  . dental implants    . ELECTROPHYSIOLOGIC STUDY N/A 09/15/2015   Procedure: V Tach Ablation;  Surgeon: Evans Lance, MD;  Location: [place] [facility];  Service: Cardiovascular;  Laterality: N/A;  . Evacution of epidural lumbar epidural abscess  1999  . INSERT / REPLACE / REMOVE PACEMAKER  11/2008  . MITRAL VALVE REPLACEMENT     w #[id number] [facility]  . PERMANENT PACEMAKER INSERTION N/A 08/05/2011   Procedure: PERMANENT PACEMAKER INSERTION;  Surgeon: Evans Lance, MD;  Location: [facility];  Service: Cardiovascular;  Laterality: N/A;  . THYROIDECTOMY    . TONSILLECTOMY    . VALVE REPLACEMENT  2000    Current Medications: Current Meds  Medication Sig  . acetaminophen (TYLENOL) 500 MG tablet Take 1,000 mg by mouth every 6 (six) hours as needed (pain).  Marland Kitchen amiodarone (PACERONE) 200 MG tablet Take one tablet by mouth twice a day Monday through Friday.  Take only one tablet by mouth on Saturday and Sunday.  . clonazePAM (  KLONOPIN) 0.5 MG tablet Take 0.5-1 tablet by mouth once nightly as needed for sleep.  . dapagliflozin propanediol (FARXIGA) 5 MG TABS tablet Take 1 tablet (5 mg total) by mouth daily before breakfast.  . levothyroxine (SYNTHROID) 50 MCG tablet TAKE 1 TABLET BY MOUTH DAILY BEFORE BREAKFAST  . metoprolol succinate (TOPROL XL) 25 MG 24 hr tablet Take 0.5 tablets (12.5 mg total) by  mouth daily.  . Multiple Vitamin (MULTIVITAMIN WITH MINERALS) TABS tablet Take 1 tablet by mouth daily.  . nitroGLYCERIN (NITROSTAT) 0.4 MG SL tablet Place 1 tablet (0.4 mg total) under the tongue every 5 (five) minutes x 3 doses as needed for chest pain.  . sacubitril-valsartan (ENTRESTO) 24-26 MG Take 1 tablet by mouth 2 (two) times daily.  Marland Kitchen spironolactone (ALDACTONE) 25 MG tablet Take 12.5 mg by mouth 3 (three) times a week.  . warfarin (COUMADIN) 5 MG tablet TAKE AS DIRECTED BY THE COUMADIN CLINIC  . [DISCONTINUED] amoxicillin (AMOXIL) 500 MG capsule TAKE 4 CAPSULES BY MOUTH 30 MINUTES PRIOR TO DENTAL WORK     Allergies:   Amiodarone and Ezetimibe   Social History   Socioeconomic History  . Marital status: Married    Spouse name: Not on file  . Number of children: 1  . Years of education: 65  . Highest education level: Master's degree (e.g., MA, MS, MEng, MEd, MSW, MBA)  Occupational History  . Occupation: retired from [employer], manufacturing systems  Tobacco Use  . Smoking status: Never Smoker  . Smokeless tobacco: Never Used  Vaping Use  . Vaping Use: Never used  Substance and Sexual Activity  . Alcohol use: Yes    Alcohol/week: 0.0 standard drinks    Comment: occasionally  . Drug use: No  . Sexual activity: Not on file  Other Topics Concern  . Not on file  Social History Narrative   Lives with wife in a one story home.  Has one daughter.  Retired.  Education: Masters.   Social Determinants of Health   Financial Resource Strain: Not on file  Food Insecurity: Not on file  Transportation Needs: Not on file  Physical Activity: Not on file  Stress: Not on file  Social Connections: Not on file     Family History: The patient's family history includes Heart disease in his father and mother; Heart failure in his father and mother.  ROS:   Please see the history of present illness.    Difficulty sleeping 2 or 3 days each week.  Unable to fall asleep.  All other systems reviewed  and are negative.  EKGs/Labs/Other Studies Reviewed:    The following studies were reviewed today: No new imaging data the most recent echo was performed in November 2021.  EKG:  EKG not repeated  Recent Labs: 01/14/2020: NT-Pro BNP 1,971 06/19/2020: B Natriuretic Peptide 600.9; BUN 30; Creatinine, Ser 1.45; Hemoglobin 15.8; Platelets 154; Potassium 5.0; Sodium 135  Recent Lipid Panel    Component Value Date/Time   CHOL 158 08/23/2015 0543   TRIG 130 08/23/2015 0543   HDL 50 08/23/2015 0543   CHOLHDL 3.2 08/23/2015 0543   VLDL 26 08/23/2015 0543   LDLCALC 82 08/23/2015 0543    Physical Exam:    VS:  BP 110/76   Pulse 79   Ht 6' 1.5" (1.867 m)   Wt 205 lb 12.8 oz (93.4 kg)   SpO2 97%   BMI 26.78 kg/m     Wt Readings from Last 3 Encounters:  11/05/20 205 lb 12.8  oz (93.4 kg)  10/15/20 203 lb (92.1 kg)  09/11/20 206 lb 6.4 oz (93.6 kg)     GEN: Overweight. No acute distress HEENT: Normal NECK: No JVD while seated with legs dangling. LYMPHATICS: No lymphadenopathy CARDIAC: No murmur.  Mechanical valve closure sound is crisp.  RRR no gallop, but trace to 1+ bilateral ankle edema. VASCULAR:  Normal Pulses. No bruits. RESPIRATORY:  Clear to auscultation without rales, wheezing or rhonchi  ABDOMEN: Soft, non-tender, non-distended, No pulsatile mass, MUSCULOSKELETAL: No deformity  SKIN: Warm and dry NEUROLOGIC:  Alert and oriented x 3 PSYCHIATRIC:  Normal affect   ASSESSMENT:    1. Chronic systolic heart failure ([place])   2. ICD (implantable cardioverter-defibrillator) in place   3. VT (ventricular tachycardia) ([facility])   4. Persistent atrial fibrillation ([place])   5. S/P mitral valve replacement   6. Bilateral carotid artery stenosis   7. Chronic anticoagulation   8. Insomnia, unspecified type   9. Shortness of breath    PLAN:    In order of problems listed above:  1. Clinically improved.  A 2D Doppler echo will be scheduled today to follow-up function.  We will  also get magnesium, basic metabolic panel, and CBC today. 2. Follow-up in device clinic 3. Soon to still be present. 4. Crisp mechanical valve closure sounds 5. No bruits 6. Anticoagulation with Coumadin and no complications 7. Clonazepam 0.5 mg to use 1/2 to 1 tablet 1 hour prior to sleep as needed.  Encouraged to use this medication sparingly and no more than a couple times per week. 8. BNP  Plan 48-month follow-up  Consider further up titration of heart failure therapy depending upon findings on echo  Guideline directed therapy for left ventricular systolic dysfunction: Angiotensin receptor-neprilysin inhibitor (ARNI)-Entresto; beta-blocker therapy - carvedilol, metoprolol succinate, or bisoprolol; mineralocorticoid receptor antagonist (MRA) therapy -spironolactone or eplerenone.  SGLT-2 agents -  Dapagliflozin Wilder Glade) or Empagliflozin ([place]).These therapies have been shown to improve clinical outcomes including reduction of rehospitalization, survival, and acute heart failure.    Medication Adjustments/Labs and Tests Ordered: Current medicines are reviewed at length with the patient today.  Concerns regarding medicines are outlined above.  Orders Placed This Encounter  Procedures  . Basic metabolic panel  . Magnesium  . Pro b natriuretic peptide  . CBC  . ECHOCARDIOGRAM COMPLETE   Meds ordered this encounter  Medications  . amoxicillin (AMOXIL) 500 MG capsule    Sig: TAKE 4 CAPSULES BY MOUTH 30 MINUTES PRIOR TO DENTAL WORK    Dispense:  4 capsule    Refill:  2  . clonazePAM (KLONOPIN) 0.5 MG tablet    Sig: Take 0.5-1 tablet by mouth once nightly as needed for sleep.    Dispense:  30 tablet    Refill:  0    There are no Patient Instructions on file for this visit.   Signed, Austin Grooms, MD  11/05/2020 1:52 PM    [facility]

## 2020-11-05 ENCOUNTER — Ambulatory Visit: Payer: Medicare Other | Admitting: Interventional Cardiology

## 2020-11-05 ENCOUNTER — Encounter: Payer: Self-pay | Admitting: Interventional Cardiology

## 2020-11-05 ENCOUNTER — Other Ambulatory Visit: Payer: Self-pay

## 2020-11-05 VITALS — BP 110/76 | HR 79 | Ht 73.5 in | Wt 205.8 lb

## 2020-11-05 DIAGNOSIS — I6523 Occlusion and stenosis of bilateral carotid arteries: Secondary | ICD-10-CM

## 2020-11-05 DIAGNOSIS — G4700 Insomnia, unspecified: Secondary | ICD-10-CM

## 2020-11-05 DIAGNOSIS — I472 Ventricular tachycardia, unspecified: Secondary | ICD-10-CM

## 2020-11-05 DIAGNOSIS — I5022 Chronic systolic (congestive) heart failure: Secondary | ICD-10-CM

## 2020-11-05 DIAGNOSIS — Z95810 Presence of automatic (implantable) cardiac defibrillator: Secondary | ICD-10-CM | POA: Diagnosis not present

## 2020-11-05 DIAGNOSIS — I4819 Other persistent atrial fibrillation: Secondary | ICD-10-CM

## 2020-11-05 DIAGNOSIS — Z7901 Long term (current) use of anticoagulants: Secondary | ICD-10-CM | POA: Diagnosis not present

## 2020-11-05 DIAGNOSIS — Z952 Presence of prosthetic heart valve: Secondary | ICD-10-CM

## 2020-11-05 DIAGNOSIS — R0602 Shortness of breath: Secondary | ICD-10-CM

## 2020-11-05 MED ORDER — CLONAZEPAM 0.5 MG PO TABS: ORAL_TABLET | ORAL | 0 refills | Status: DC

## 2020-11-05 MED ORDER — AMOXICILLIN 500 MG PO CAPS: ORAL_CAPSULE | ORAL | 2 refills | Status: DC

## 2020-11-05 NOTE — Patient Instructions (Signed)
Medication Instructions:  1) You may take 1/2 to 1 whole tablet of Clonazepam 0.5mg  once nightly as needed for sleep.  *If you need a refill on your cardiac medications before your next appointment, please call your pharmacy*   Lab Work: BMET, Magnesium, Pro BNP and CBC today  If you have labs (blood work) drawn today and your tests are completely normal, you will receive your results only by: Marland Kitchen MyChart Message (if you have MyChart) OR . A paper copy in the mail If you have any lab test that is abnormal or we need to change your treatment, we will call you to review the results.   Testing/Procedures: Your physician has requested that you have an echocardiogram. Echocardiography is a painless test that uses sound waves to create images of your heart. It provides your doctor with information about the size and shape of your heart and how well your heart's chambers and valves are working. This procedure takes approximately one hour. There are no restrictions for this procedure.   Follow-Up: At [facility], you and your health needs are our priority.  As part of our continuing mission to provide you with exceptional heart care, we have created designated Provider Care Teams.  These Care Teams include your primary Cardiologist (physician) and Advanced Practice Providers (APPs -  Physician Assistants and Nurse Practitioners) who all work together to provide you with the care you need, when you need it.  We recommend signing up for the patient portal called "MyChart".  Sign up information is provided on this After Visit Summary.  MyChart is used to connect with patients for Virtual Visits (Telemedicine).  Patients are able to view lab/test results, encounter notes, upcoming appointments, etc.  Non-urgent messages can be sent to your provider as well.   To learn more about what you can do with MyChart, go to [email or web address].    Your next appointment:   4 month(s)  The format for your  next appointment:   In Person  Provider:   You may see Sinclair Grooms, MD or one of the following Advanced Practice Providers on your designated Care Team:    Kathyrn Drown, NP    Other Instructions

## 2020-11-06 LAB — CBC
Hematocrit: 49.8 % (ref 37.5–51.0)
Hemoglobin: 15.7 g/dL (ref 13.0–17.7)
MCH: 29.8 pg (ref 26.6–33.0)
MCHC: 31.5 g/dL (ref 31.5–35.7)
MCV: 95 fL (ref 79–97)
Platelets: 163 10*3/uL (ref 150–450)
RBC: 5.26 x10E6/uL (ref 4.14–5.80)
RDW: 13.8 % (ref 11.6–15.4)
WBC: 7.2 10*3/uL (ref 3.4–10.8)

## 2020-11-06 LAB — BASIC METABOLIC PANEL
BUN/Creatinine Ratio: 14 (ref 10–24)
BUN: 20 mg/dL (ref 8–27)
CO2: 20 mmol/L (ref 20–29)
Calcium: 8.6 mg/dL (ref 8.6–10.2)
Chloride: 102 mmol/L (ref 96–106)
Creatinine, Ser: 1.39 mg/dL — ABNORMAL HIGH (ref 0.76–1.27)
Glucose: 83 mg/dL (ref 65–99)
Potassium: 4.5 mmol/L (ref 3.5–5.2)
Sodium: 139 mmol/L (ref 134–144)
eGFR: 52 mL/min/{1.73_m2} — ABNORMAL LOW (ref >59)

## 2020-11-06 LAB — PRO B NATRIURETIC PEPTIDE: NT-Pro BNP: 1191 pg/mL — ABNORMAL HIGH (ref 0–486)

## 2020-11-06 LAB — MAGNESIUM: Magnesium: 2.4 mg/dL — ABNORMAL HIGH (ref 1.6–2.3)

## 2020-11-10 ENCOUNTER — Ambulatory Visit (INDEPENDENT_AMBULATORY_CARE_PROVIDER_SITE_OTHER): Payer: Medicare Other

## 2020-11-10 DIAGNOSIS — Z95810 Presence of automatic (implantable) cardiac defibrillator: Secondary | ICD-10-CM

## 2020-11-10 DIAGNOSIS — I5022 Chronic systolic (congestive) heart failure: Secondary | ICD-10-CM

## 2020-11-12 NOTE — Progress Notes (Signed)
EPIC Encounter for ICM Monitoring  Patient Name: Austin [name] is a 80 y.o. male Date: 11/12/2020 Primary Care Physican: Lajean Manes, MD Primary Cardiologist:Smith Electrophysiologist: Lovena Le 11/12/2020 Weight:198-200lbs  Clinical Status (06-Oct-2020 to 10-Nov-2020) AT/AF 623 episodes  Time in AT/AF 21.5 hr/day (89.5%) - was 27.4% (09/02/20 report)    Spoke with patient and reports feeling well at this time.  Denies fluid symptoms.    OptivolThoracic impedancesuggesting normal fluid levels.Message sent to device clinic triage on 10/10/2020 and 11/12/2020 to review increase in AT/AF Burden  Prescribed:No diuretic (Per Dr Thompson Caul 06/24/2020 OV note, loop diuretic discontinued but may need to be restarted if he has weight gain) Spironolactone 25 mg take 0.5 tablet (12.5 mg total) every Mon, Wed, and Friday.  Labs: 06/19/2020 Creatinine 1.45, BUN 30, Potassium 5.0, Sodium 135, GFR 50 04/30/2020 Creatinine1.39, BUN23, Potassium4.5, [id number], [id number] 04/08/2020 Creatinine1.19, BUN22, Potassium4.2, Sodium136, [id number]  02/15/2020 Creatinine1.28, BUN21, Potassium4.4, Sodium136, [id number] 01/29/2020 Creatinine1.26, BUN25, Potassium4.3, Sodium135, [id number] 01/14/2020 Creatinine1.17, BUN19, Potassium4.0, Sodium140, [id number]  Acomplete set of results can be found in Results Review.  Recommendations:No changes and encouraged to call if experiencing any fluid symptoms.  Follow-up plan: ICM clinic phone appointment on6/08/2020. 91 day device clinic remote transmission6/07/2020.   EP/Cardiology Office Visits:Recall 8/22/2022with Dr Lovena Le  Copy of ICM check sent to Dr.Taylor.   3 month ICM trend: 11/10/2020.    1 Year ICM trend:       [name], RN 11/12/2020 4:06 PM

## 2020-11-13 ENCOUNTER — Ambulatory Visit (INDEPENDENT_AMBULATORY_CARE_PROVIDER_SITE_OTHER): Payer: Medicare Other | Admitting: *Deleted

## 2020-11-13 DIAGNOSIS — Z5181 Encounter for therapeutic drug level monitoring: Secondary | ICD-10-CM

## 2020-11-13 DIAGNOSIS — I4891 Unspecified atrial fibrillation: Secondary | ICD-10-CM | POA: Diagnosis not present

## 2020-11-13 LAB — POCT INR: INR: 4.5 — AB (ref 2.0–3.0)

## 2020-11-13 NOTE — Patient Instructions (Signed)
Description   Spoke with pt and advised him to hold today's dose  and take 1/2 tablet tomorrow then resume same dosage of Warfarin 1/2 tablet daily except 1 tablet on Mondays, Wednesdays and Fridays. Recheck INR in 1 week (normally 2 weeks). Amiodarone increased to BID on 06/26/20.   Pt's cell phone [phone] in case difficulty reaching pt on home phone.

## 2020-11-20 ENCOUNTER — Ambulatory Visit (INDEPENDENT_AMBULATORY_CARE_PROVIDER_SITE_OTHER): Payer: Medicare Other | Admitting: *Deleted

## 2020-11-20 DIAGNOSIS — Z5181 Encounter for therapeutic drug level monitoring: Secondary | ICD-10-CM

## 2020-11-20 DIAGNOSIS — I4891 Unspecified atrial fibrillation: Secondary | ICD-10-CM

## 2020-11-20 LAB — POCT INR: INR: 2.9 (ref 2.0–3.0)

## 2020-12-01 DIAGNOSIS — R531 Weakness: Secondary | ICD-10-CM

## 2020-12-01 DIAGNOSIS — R200 Anesthesia of skin: Secondary | ICD-10-CM

## 2020-12-01 DIAGNOSIS — R42 Dizziness and giddiness: Secondary | ICD-10-CM

## 2020-12-01 DIAGNOSIS — I4891 Unspecified atrial fibrillation: Secondary | ICD-10-CM

## 2020-12-03 NOTE — Telephone Encounter (Signed)
Great questions!  Happy Nurses Week!!! You are a very good one!!!!

## 2020-12-03 NOTE — Telephone Encounter (Signed)
Needs BMET, Mg, and CBC

## 2020-12-04 ENCOUNTER — Other Ambulatory Visit: Payer: Medicare Other

## 2020-12-04 ENCOUNTER — Other Ambulatory Visit: Payer: Self-pay

## 2020-12-04 ENCOUNTER — Ambulatory Visit (INDEPENDENT_AMBULATORY_CARE_PROVIDER_SITE_OTHER): Payer: Medicare Other

## 2020-12-04 DIAGNOSIS — Z5181 Encounter for therapeutic drug level monitoring: Secondary | ICD-10-CM | POA: Diagnosis not present

## 2020-12-04 DIAGNOSIS — I4891 Unspecified atrial fibrillation: Secondary | ICD-10-CM

## 2020-12-04 DIAGNOSIS — R200 Anesthesia of skin: Secondary | ICD-10-CM

## 2020-12-04 DIAGNOSIS — R531 Weakness: Secondary | ICD-10-CM

## 2020-12-04 DIAGNOSIS — R42 Dizziness and giddiness: Secondary | ICD-10-CM | POA: Diagnosis not present

## 2020-12-04 DIAGNOSIS — R202 Paresthesia of skin: Secondary | ICD-10-CM | POA: Diagnosis not present

## 2020-12-04 LAB — POCT INR: INR: 4.1 — AB (ref 2.0–3.0)

## 2020-12-04 NOTE — Telephone Encounter (Signed)
Spoke with pt and scheduled him to come in today for labs.  Pt appreciative for call.

## 2020-12-04 NOTE — Patient Instructions (Signed)
Description   Spoke with pt and advised him to skip today's dosage of Warfarin, then resume same dosage 1/2 tablet daily except 1 tablet on Mondays, Wednesdays and Fridays. Recheck INR in 2 weeks.  Pt's cell phone [phone] in case difficulty reaching pt on home phone.

## 2020-12-05 ENCOUNTER — Telehealth: Payer: Self-pay | Admitting: *Deleted

## 2020-12-05 LAB — MAGNESIUM: Magnesium: 2.5 mg/dL — ABNORMAL HIGH (ref 1.6–2.3)

## 2020-12-05 LAB — BASIC METABOLIC PANEL
BUN/Creatinine Ratio: 20 (ref 10–24)
BUN: 25 mg/dL (ref 8–27)
CO2: 21 mmol/L (ref 20–29)
Calcium: 9.3 mg/dL (ref 8.6–10.2)
Chloride: 101 mmol/L (ref 96–106)
Creatinine, Ser: 1.26 mg/dL (ref 0.76–1.27)
Glucose: 137 mg/dL — ABNORMAL HIGH (ref 65–99)
Potassium: 4.7 mmol/L (ref 3.5–5.2)
Sodium: 139 mmol/L (ref 134–144)
eGFR: 58 mL/min/{1.73_m2} — ABNORMAL LOW (ref >59)

## 2020-12-05 LAB — CBC
Hematocrit: 50.1 % (ref 37.5–51.0)
Hemoglobin: 16.6 g/dL (ref 13.0–17.7)
MCH: 31.3 pg (ref 26.6–33.0)
MCHC: 33.1 g/dL (ref 31.5–35.7)
MCV: 94 fL (ref 79–97)
Platelets: 158 10*3/uL (ref 150–450)
RBC: 5.31 x10E6/uL (ref 4.14–5.80)
RDW: 13.7 % (ref 11.6–15.4)
WBC: 7.8 10*3/uL (ref 3.4–10.8)

## 2020-12-05 NOTE — Telephone Encounter (Signed)
Spoke with pt and reviewed lab results.  Pt agreeable to come for repeat Magnesium on 5/27.

## 2020-12-10 ENCOUNTER — Ambulatory Visit (HOSPITAL_COMMUNITY): Payer: Medicare Other | Attending: Internal Medicine

## 2020-12-10 ENCOUNTER — Other Ambulatory Visit: Payer: Self-pay

## 2020-12-10 DIAGNOSIS — I5022 Chronic systolic (congestive) heart failure: Secondary | ICD-10-CM | POA: Diagnosis not present

## 2020-12-10 LAB — ECHOCARDIOGRAM COMPLETE
Area-P 1/2: 3.55 cm2
S' Lateral: 4.4 cm

## 2020-12-10 MED ORDER — PERFLUTREN LIPID MICROSPHERE
3.0000 mL | INTRAVENOUS | Status: AC | PRN
Start: 2020-12-10 — End: 2020-12-10
  Administered 2020-12-10: 3 mL via INTRAVENOUS

## 2020-12-15 ENCOUNTER — Other Ambulatory Visit: Payer: Self-pay | Admitting: *Deleted

## 2020-12-15 DIAGNOSIS — I5022 Chronic systolic (congestive) heart failure: Secondary | ICD-10-CM

## 2020-12-15 MED ORDER — DAPAGLIFLOZIN PROPANEDIOL 10 MG PO TABS: 10.0000 mg | ORAL_TABLET | Freq: Every day | ORAL | 11 refills | Status: DC

## 2020-12-15 MED ORDER — CARVEDILOL 3.125 MG PO TABS: 3.1250 mg | ORAL_TABLET | Freq: Two times a day (BID) | ORAL | 3 refills | Status: DC

## 2020-12-16 ENCOUNTER — Telehealth: Payer: Self-pay

## 2020-12-16 NOTE — Telephone Encounter (Addendum)
**Note De-identified  Obfuscation** -----  **Note De-Identified  Obfuscation** Message from Loren Racer, RN sent at 12/15/2020  9:34 AM EDT -----  We just increased this pt's Farxiga to 10mg  once daily.  He mentioned that the cost is a little high.  I've provided him with the number for [place].

## 2020-12-16 NOTE — Telephone Encounter (Signed)
**Note De-Identified  Obfuscation** I called the pt and we discussed him applying for pt asst through [place] [employer] [place] for his [place].  He was reluctant at first but I advised him that if he is approved that he will receive his [place] free of charge for the rest of this year from [facility] and [place].  He then stated that he does want to apply so I gave him [place] [employer] [place] phone number and advised him to call them with questions about their [place] program and that if it appears that he would be eligable to be approved for the program to request that they mail him an application to his home.  He is aware that once he receives it to complete his part of the application, obtain required documents per [place] [employer] [place], and to bring all to Dr Darliss Ridgel office and that we will take care of the provider page and will fax all to [place] and [place].  He verbalized understanding to all instructions given and thanked me for calling him to discuss [place] and [place] with him.

## 2020-12-19 ENCOUNTER — Ambulatory Visit (INDEPENDENT_AMBULATORY_CARE_PROVIDER_SITE_OTHER): Payer: Medicare Other

## 2020-12-19 ENCOUNTER — Telehealth: Payer: Self-pay | Admitting: Interventional Cardiology

## 2020-12-19 ENCOUNTER — Other Ambulatory Visit: Payer: Medicare Other

## 2020-12-19 DIAGNOSIS — I4891 Unspecified atrial fibrillation: Secondary | ICD-10-CM

## 2020-12-19 DIAGNOSIS — Z5181 Encounter for therapeutic drug level monitoring: Secondary | ICD-10-CM

## 2020-12-19 DIAGNOSIS — Z952 Presence of prosthetic heart valve: Secondary | ICD-10-CM | POA: Diagnosis not present

## 2020-12-19 LAB — POCT INR: INR: 4.6 — AB (ref 2.0–3.0)

## 2020-12-19 NOTE — Telephone Encounter (Signed)
Summer called with an out of range INR for this patient.

## 2020-12-19 NOTE — Telephone Encounter (Signed)
Sent to the preop pool inadvertently. Please route to correct provider/pool.

## 2020-12-19 NOTE — Telephone Encounter (Signed)
New message:     Hardin Negus Remote INR calling to make a report on this patient. [phone] [facility]

## 2020-12-19 NOTE — Patient Instructions (Signed)
Description   Spoke with pt and advised him to skip today's dosage of Warfarin, then start taking 1/2 tablet daily except 1 tablet on Mondays and Fridays. Recheck INR in 2 weeks.  Pt's cell phone [phone] in case difficulty reaching pt on home phone.

## 2020-12-19 NOTE — Telephone Encounter (Signed)
We are already aware of INR of 4.6 today.  See anticoagulation note in Epic.  Returned call to verify result received.

## 2020-12-19 NOTE — Telephone Encounter (Signed)
Duplicate phone note, aware of INR 4.6 today already addressed with pt.  See anticoagulation note in Epic.

## 2020-12-22 NOTE — Progress Notes (Signed)
Cardiology Office Note Date:  12/24/2020  Patient ID:  Austin [name], Austin [name] 04-24-1941, MRN [id number] PCP:  Lajean Manes, MD  Cardiologist:  Dr. Tamala Julian Electrophysiologist: Dr. Lovena Le   Chief Complaint:  6 mo  History of Present Illness: Austin [name] is a 80 y.o. male with history of VT (ablation 2017), AFib, CHB, chronic CHF (systolic), VHD s/p MVR (mechanical, 2000), hypothyroidism (s/p thyroidectomy), NICM   He comes today to be seen for Dr. Lovena Le, last seen by him Feb 2022, iscussed episode of VT in Nov prompting increase in amio dose.  EF had improved. Known LA flutter, was in flutter, attempts to ace terminate resulted in AFib. With no recurrent VT amio reduced to 200mg  BID (mon-Fri) and daily on the weekends.  More recently saw Dr. Tamala Julian April 2022, doing/feeling well, labs planned, no changes were made.  TODAY Since his change in medications he thinks generally feels a bit better, less brain fog and less lightheadedness. He does mentions some CP though.  After much discussion, after his echo the recommendation was to increase his Jardiance and if tolerating this  Then change metoprolol to coreg. Pt reported at baseline his SBP tend to run low and when <100 feels lightheaded. Was recommended to stop the spironolactone and continue with the other recommendations   What he has done is only increase the Jardiance, he did stop the metoprolol and spironolactone, NEVER started coreg  He mentions a sense of fatigue, lightheadedness and dizziness is not new for him and off the metoprolol feels this is better but not resolved. No palpitations No near syncope or syncope No shocks He mentions some CP that he has today He has had this before, infrequently and has never given much concern about it. IHe has a hard time describing it, is a vague discomfort is R of sternum though earlier was left side and even LUQ/more towards his abdomen. No associated symptoms Perhaps walking  made it more noted  He would like to take less amiodarone   Device information MDT dual chamber ICD implanted 08/05/2011, gen change 05/04/2017 + hx of VT Nov 2021, VT 160's required external shock  AAD Amiodarone goes back to 2010  Past Medical History:  Diagnosis Date  . AICD (automatic cardioverter/defibrillator) present   . Atrial fibrillation ([place])   . 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 Conductive hearing loss   . Intraspinal abscess   . 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 Nonischemic cardiomyopathy ([place])   . Psychosexual dysfunction with inhibited sexual excitement   . S/P mitral valve replacement   . Syncope and collapse   . Unspecified sleep apnea    last sleep study 11/07  . Ventricular tachycardia ([place])   . VT (ventricular tachycardia) ([place])     Past Surgical History:  Procedure Laterality Date  . BIV ICD GENERATOR CHANGEOUT N/A 05/04/2017   Procedure: BiV ICD Generator Changeout;  Surgeon: Evans Lance, MD;  Location: [facility];  Service: Cardiovascular;  Laterality: N/A;  . CARDIAC CATHETERIZATION  04/24/2002  . CARDIAC VALVE REPLACEMENT    . CARDIOVERSION  03/09/2012   Procedure: CARDIOVERSION;  Surgeon: Evans Lance, MD;  Location: [facility];  Service: Cardiovascular;  Laterality: N/A;  . CARDIOVERSION N/A 11/22/2013   Procedure: CARDIOVERSION;  Surgeon: Sanda Klein, MD;  Location: [facility];  Service: Cardiovascular;  Laterality: N/A;  .  dental implants    . ELECTROPHYSIOLOGIC STUDY N/A 09/15/2015   Procedure: V Tach Ablation;  Surgeon: Evans Lance, MD;  Location: [facility];  Service: Cardiovascular;  Laterality: N/A;  . Evacution of epidural lumbar epidural abscess  1999  . INSERT / REPLACE / REMOVE PACEMAKER  11/2008  . MITRAL VALVE REPLACEMENT     w #[id number]  . PERMANENT PACEMAKER  INSERTION N/A 08/05/2011   Procedure: PERMANENT PACEMAKER INSERTION;  Surgeon: Evans Lance, MD;  Location: [facility];  Service: Cardiovascular;  Laterality: N/A;  . THYROIDECTOMY    . TONSILLECTOMY    . VALVE REPLACEMENT  2000    Current Outpatient Medications  Medication Sig Dispense Refill  . acetaminophen (TYLENOL) 500 MG tablet Take 1,000 mg by mouth every 6 (six) hours as needed (pain).    Marland Kitchen amiodarone (PACERONE) 200 MG tablet Take one tablet by mouth twice a day Monday through Friday.  Take only one tablet by mouth on Saturday and Sunday. 180 tablet 3  . amoxicillin (AMOXIL) 500 MG capsule TAKE 4 CAPSULES BY MOUTH 30 MINUTES PRIOR TO DENTAL WORK 4 capsule 2  . dapagliflozin propanediol (FARXIGA) 10 MG TABS tablet Take 1 tablet (10 mg total) by mouth daily before breakfast. 30 tablet 11  . levothyroxine (SYNTHROID) 50 MCG tablet TAKE 1 TABLET BY MOUTH DAILY BEFORE BREAKFAST 90 tablet 2  . Melatonin 1 MG SUBL Place under the tongue.    . metoprolol succinate (TOPROL-XL) 25 MG 24 hr tablet Take 0.5 tablets (12.5 mg total) by mouth daily. Take with or immediately following a meal. 45 tablet 3  . Multiple Vitamin (MULTIVITAMIN WITH MINERALS) TABS tablet Take 1 tablet by mouth daily.    . nitroGLYCERIN (NITROSTAT) 0.4 MG SL tablet Place 1 tablet (0.4 mg total) under the tongue every 5 (five) minutes x 3 doses as needed for chest pain. 25 tablet 2  . sacubitril-valsartan (ENTRESTO) 24-26 MG Take 1 tablet by mouth 2 (two) times daily. 60 tablet 11  . warfarin (COUMADIN) 5 MG tablet TAKE AS DIRECTED BY THE COUMADIN CLINIC 90 tablet 0   No current facility-administered medications for this visit.    Allergies:   Amiodarone and Ezetimibe   Social History:  The patient  reports that he has never smoked. He has never used smokeless tobacco. He reports current alcohol use. He reports that he does not use drugs.   Family History:  The patient's family history includes Heart disease in his  father and mother; Heart failure in his father and mother.  ROS:  Please see the history of present illness.    All other systems are reviewed and otherwise negative.   PHYSICAL EXAM:  VS:  BP 106/80   Pulse 62   Ht 6' 1.5" (1.867 m)   Wt 201 lb 12.8 oz (91.5 kg)   SpO2 96%   BMI 26.26 kg/m  BMI: Body mass index is 26.26 kg/m. Well nourished, well developed, in no acute distress HEENT: normocephalic, atraumatic Neck: no JVD, carotid bruits or masses Cardiac: RRR; no significant murmurs, no rubs, or gallops Lungs:  CTA b/l, no wheezing, rhonchi or rales Abd: soft, nontender MS: no deformity or atrophy Ext: trace edema Skin: warm and dry, no rash Neuro:  No gross deficits appreciated Psych: euthymic mood, full affect  ICD site is stable, no tethering or discomfort   EKG:  Done today and reviewed by myself shows  AFlutter, V paced 62bpm  Device interrogation  done today and reviewed by myself:  Battery and device measurements are good Some of his flutter waves fallin refractory and he will intermittently APace. I suspect he has been 100% AFlutter or fib since January, or at least since he saw Dr. Lovena Le in [facility]. VP 93.7% No VT  12/10/2020: TTE IMPRESSIONS  1. Slow flowing contrast at the LV apex. No definite discreet thrombus  but flow suggestive of possible sludge. Left ventricular ejection  fraction, by estimation, is 30%. The left ventricle has severely decreased  function. The left ventricle demonstrates  global hypokinesis. There is mild left ventricular hypertrophy. Left  ventricular diastolic parameters are indeterminate.  2. Right ventricular systolic function is normal. The right ventricular  size is normal. There is normal pulmonary artery systolic pressure. The  estimated right ventricular systolic pressure is 37.8 mmHg.  3. Left atrial size was severely dilated.  4. Right atrial size was moderately dilated.  5. The mitral valve has been  repaired/replaced. Trivial mitral valve  regurgitation (normal washing jets). The mean mitral valve gradient is 3.0  mmHg with average heart rate of 70 bpm. There is a 33 mm [employer] Jude  mechanical valve present in the mitral  position. Procedure Date: 2000. Echo findings are consistent with normal  structure and function of the mitral valve prosthesis.  6. Tricuspid valve regurgitation is mild to moderate.  7. The aortic valve is abnormal. There is moderate calcification of the  aortic valve. Aortic valve regurgitation is not visualized. Mild to  moderate aortic valve sclerosis/calcification is present, without any  evidence of aortic stenosis.  8. Aortic dilatation noted. There is borderline dilatation of the aortic  root, measuring 40 mm.  9. The inferior vena cava is normal in size with <50% respiratory  variability, suggesting right atrial pressure of 8 mmHg.    06/05/2020: Carotid US Summary:  Right Carotid: Velocities in the right ICA are consistent with a 1-39%  stenosis.         Non-hemodynamically significant plaque <50% noted in the  CCA.   Left Carotid: Velocities in the left ICA are consistent with a 1-39%  stenosis.        Non-hemodynamically significant plaque <50% noted in the  CCA.   Vertebrals: Right vertebral artery demonstrates antegrade flow. Left  vertebral        artery demonstrates high resistant flow.  Subclavians: Normal flow hemodynamics were seen in bilateral subclavian        arteries.    09/15/2015: EPS/ablation Conclusion: Successful catheter ablation of 2 distinct ventricular tachycardias originating from the mitral valve annulus which appeared to correlate with the patient's clinical ventricular tachycardia. Unsuccessful ablation of the inducible ventricular tachycardia originating from the septal portion of the mitral valve annulus as previously noted above.     Recent Labs: 06/19/2020: B Natriuretic Peptide  600.9 11/05/2020: NT-Pro BNP 1,191 12/04/2020: BUN 25; Creatinine, Ser 1.26; Hemoglobin 16.6; Magnesium 2.5; Platelets 158; Potassium 4.7; Sodium 139  No results found for requested labs within last 8760 hours.   Estimated Creatinine Clearance: 55.4 mL/min (by C-G formula based on SCr of 1.26 mg/dL).   Wt Readings from Last 3 Encounters:  12/24/20 201 lb 12.8 oz (91.5 kg)  11/05/20 205 lb 12.8 oz (93.4 kg)  10/15/20 203 lb (92.1 kg)     Other studies reviewed: Additional studies/records reviewed today include: summarized above  ASSESSMENT AND PLAN:  1. VT     None further  I will reach out to Dr. Lovena Le,  likely to reduce to 200mg  daily on his amiodarone  2. CRT-D     Intact function, no programming changes made   3. Longstanding persistent AFib     L sided AFlutter     CHA2DS2Vasc is 4, on warfarin      He is in his AFlutter, I did not try to pacer terminate it given did not pork previously I note with the onset of his AFib/flutter (last fall)  he is RV pacing nearly all of the time His EF down some He remains out of rhythm despite attempts by Dr. Lovena Le to pace terminate and amiodarone  I will reach out to Drs Lovena Le and Tamala Julian if DCCV is reasonable   4. NICM 5. Chronic CHF (systolic)     OptiVol looks good     No symptoms or exam findings of volume OL  He is off BB entirely  I have asked him to resume his prior Metoprolol Succ 12.5mg  daily dose and defer change to coreg to Dr. Tamala Julian  6. Mechanical MVR     Functioning well by echo      Warfarin     Recent adjustment with supra therapeutic results of late     Denies bleeding signs of bleeding     C/w coumadin clinic  7. CP     Paced EKG though no clear ischemic changes     Somewhat atypical with some typical features.     D/w DOD, given no recent ischemic w/u would pursue lexiscan stress test     Patient would like to hold off on stress testing for now and d/w Dr. Tamala Julian.     Discussed ER precautions with the  patient   Disposition: F/u with Dr. Tamala Julian next month as scheduled, remotes as usual, EP pending Dr. Tanna Furry rec regarding amiodarone and AF management and any changes.  Current medicines are reviewed at length with the patient today.  The patient did not have any concerns regarding medicines.  Venetia Night, PA-C 12/24/2020 1:28 PM     [address] [place] [address] [phone] (office)  [phone] (fax)

## 2020-12-24 ENCOUNTER — Ambulatory Visit (INDEPENDENT_AMBULATORY_CARE_PROVIDER_SITE_OTHER): Payer: Medicare Other

## 2020-12-24 ENCOUNTER — Ambulatory Visit: Payer: Medicare Other | Admitting: Physician Assistant

## 2020-12-24 ENCOUNTER — Other Ambulatory Visit: Payer: Medicare Other

## 2020-12-24 ENCOUNTER — Other Ambulatory Visit: Payer: Self-pay

## 2020-12-24 ENCOUNTER — Encounter: Payer: Self-pay | Admitting: Physician Assistant

## 2020-12-24 VITALS — BP 106/80 | HR 62 | Ht 73.5 in | Wt 201.8 lb

## 2020-12-24 DIAGNOSIS — I4811 Longstanding persistent atrial fibrillation: Secondary | ICD-10-CM

## 2020-12-24 DIAGNOSIS — Z95810 Presence of automatic (implantable) cardiac defibrillator: Secondary | ICD-10-CM

## 2020-12-24 DIAGNOSIS — I484 Atypical atrial flutter: Secondary | ICD-10-CM | POA: Diagnosis not present

## 2020-12-24 DIAGNOSIS — I472 Ventricular tachycardia, unspecified: Secondary | ICD-10-CM

## 2020-12-24 DIAGNOSIS — Z952 Presence of prosthetic heart valve: Secondary | ICD-10-CM | POA: Diagnosis not present

## 2020-12-24 DIAGNOSIS — I5022 Chronic systolic (congestive) heart failure: Secondary | ICD-10-CM

## 2020-12-24 DIAGNOSIS — R079 Chest pain, unspecified: Secondary | ICD-10-CM

## 2020-12-24 DIAGNOSIS — I428 Other cardiomyopathies: Secondary | ICD-10-CM

## 2020-12-24 LAB — CUP PACEART INCLINIC DEVICE CHECK
Battery Remaining Longevity: 49 mo
Battery Voltage: 2.95 V
Brady Statistic AP VP Percent: 11.15 %
Brady Statistic AP VS Percent: 0.63 %
Brady Statistic AS VP Percent: 82.58 %
Brady Statistic AS VS Percent: 5.63 %
Brady Statistic RA Percent Paced: 10.96 %
Brady Statistic RV Percent Paced: 93.71 %
Date Time Interrogation Session: 20220601170436
HighPow Impedance: 73 Ohm
Implantable Lead Implant Date: 20130110
Implantable Lead Implant Date: 20130110
Implantable Lead Location: 753859
Implantable Lead Location: 753860
Implantable Lead Model: 5076
Implantable Lead Model: 6935
Implantable Pulse Generator Implant Date: 20181010
Lead Channel Impedance Value: 342 Ohm
Lead Channel Impedance Value: 399 Ohm
Lead Channel Impedance Value: 418 Ohm
Lead Channel Pacing Threshold Amplitude: 1 V
Lead Channel Pacing Threshold Pulse Width: 0.4 ms
Lead Channel Sensing Intrinsic Amplitude: 0.375 mV
Lead Channel Sensing Intrinsic Amplitude: 0.75 mV
Lead Channel Sensing Intrinsic Amplitude: 1 mV
Lead Channel Sensing Intrinsic Amplitude: 5 mV
Lead Channel Setting Pacing Amplitude: 2.5 V
Lead Channel Setting Pacing Amplitude: 3.5 V
Lead Channel Setting Pacing Pulse Width: 0.4 ms
Lead Channel Setting Sensing Sensitivity: 0.3 mV

## 2020-12-24 LAB — CUP PACEART REMOTE DEVICE CHECK
Battery Remaining Longevity: 49 mo
Battery Voltage: 2.96 V
Brady Statistic AP VP Percent: 15.21 %
Brady Statistic AP VS Percent: 0.97 %
Brady Statistic AS VP Percent: 78.65 %
Brady Statistic AS VS Percent: 5.17 %
Brady Statistic RA Percent Paced: 14.66 %
Brady Statistic RV Percent Paced: 93.65 %
Date Time Interrogation Session: 20220601043625
HighPow Impedance: 72 Ohm
Implantable Lead Implant Date: 20130110
Implantable Lead Implant Date: 20130110
Implantable Lead Location: 753859
Implantable Lead Location: 753860
Implantable Lead Model: 5076
Implantable Lead Model: 6935
Implantable Pulse Generator Implant Date: 20181010
Lead Channel Impedance Value: 304 Ohm
Lead Channel Impedance Value: 361 Ohm
Lead Channel Impedance Value: 399 Ohm
Lead Channel Pacing Threshold Amplitude: 1 V
Lead Channel Pacing Threshold Pulse Width: 0.4 ms
Lead Channel Sensing Intrinsic Amplitude: 0.375 mV
Lead Channel Sensing Intrinsic Amplitude: 0.375 mV
Lead Channel Sensing Intrinsic Amplitude: 0.75 mV
Lead Channel Sensing Intrinsic Amplitude: 0.75 mV
Lead Channel Setting Pacing Amplitude: 2.5 V
Lead Channel Setting Pacing Amplitude: 3.5 V
Lead Channel Setting Pacing Pulse Width: 0.4 ms
Lead Channel Setting Sensing Sensitivity: 0.3 mV

## 2020-12-24 LAB — BASIC METABOLIC PANEL
BUN/Creatinine Ratio: 14 (ref 10–24)
BUN: 18 mg/dL (ref 8–27)
CO2: 20 mmol/L (ref 20–29)
Calcium: 8.8 mg/dL (ref 8.6–10.2)
Chloride: 101 mmol/L (ref 96–106)
Creatinine, Ser: 1.33 mg/dL — ABNORMAL HIGH (ref 0.76–1.27)
Glucose: 90 mg/dL (ref 65–99)
Potassium: 4.4 mmol/L (ref 3.5–5.2)
Sodium: 137 mmol/L (ref 134–144)
eGFR: 55 mL/min/{1.73_m2} — ABNORMAL LOW (ref >59)

## 2020-12-24 LAB — MAGNESIUM: Magnesium: 2.3 mg/dL (ref 1.6–2.3)

## 2020-12-24 MED ORDER — CARVEDILOL 3.125 MG PO TABS: 3.1250 mg | ORAL_TABLET | Freq: Two times a day (BID) | ORAL | 3 refills | Status: DC

## 2020-12-24 MED ORDER — METOPROLOL SUCCINATE ER 25 MG PO TB24: 12.5000 mg | ORAL_TABLET | Freq: Every day | ORAL | 3 refills | Status: DC

## 2020-12-24 NOTE — Addendum Note (Signed)
Addended by: Loren Racer on: 12/24/2020 03:17 PM   Modules accepted: Orders

## 2020-12-24 NOTE — Patient Instructions (Addendum)
Medication Instructions:    START TAKING  METOPROLOL SUCCINATE 12.5 MG ONCE A DAY   CONTINUE TO NOT  TAKE CARVEDILOL(COREG)    *If you need a refill on your cardiac medications before your next appointment, please call your pharmacy*   Lab Work: NONE ORDERED  TODAY   If you have labs (blood work) drawn today and your tests are completely normal, you will receive your results only by: Marland Kitchen MyChart Message (if you have MyChart) OR . A paper copy in the mail If you have any lab test that is abnormal or we need to change your treatment, we will call you to review the results.   Testing/Procedures: NONE ORDERED  TODAY    Follow-Up: At [facility], you and your health needs are our priority.  As part of our continuing mission to provide you with exceptional heart care, we have created designated Provider Care Teams.  These Care Teams include your primary Cardiologist (physician) and Advanced Practice Providers (APPs -  Physician Assistants and Nurse Practitioners) who all work together to provide you with the care you need, when you need it.  We recommend signing up for the patient portal called "MyChart".  Sign up information is provided on this After Visit Summary.  MyChart is used to connect with patients for Virtual Visits (Telemedicine).  Patients are able to view lab/test results, encounter notes, upcoming appointments, etc.  Non-urgent messages can be sent to your provider as well.   To learn more about what you can do with MyChart, go to [email or web address].    Your next appointment:  AS SCHEDULED WITH DR [place]  Other Instructions

## 2020-12-25 NOTE — Telephone Encounter (Signed)
**Note De-Identified Gokul Waybright Obfuscation** Letter received from [facility] and [facility] fax stating that they have approved the pt for asst with Farxiga until 07/25/2021. Pt ID: [id number]  The letter states that they have also notified the pt of this approval as well.

## 2020-12-26 ENCOUNTER — Telehealth: Payer: Self-pay

## 2020-12-26 ENCOUNTER — Ambulatory Visit (INDEPENDENT_AMBULATORY_CARE_PROVIDER_SITE_OTHER): Payer: Medicare Other

## 2020-12-26 DIAGNOSIS — Z95810 Presence of automatic (implantable) cardiac defibrillator: Secondary | ICD-10-CM | POA: Diagnosis not present

## 2020-12-26 DIAGNOSIS — I5022 Chronic systolic (congestive) heart failure: Secondary | ICD-10-CM

## 2020-12-26 MED ORDER — DAPAGLIFLOZIN PROPANEDIOL 10 MG PO TABS: 10.0000 mg | ORAL_TABLET | Freq: Every day | ORAL | 3 refills | Status: DC

## 2020-12-26 NOTE — Telephone Encounter (Signed)
Remote ICM transmission received.  Attempted call to patient regarding ICM remote transmission and left detailed message per DPR.  Advised to return call for any fluid symptoms or questions. Next ICM remote transmission scheduled 01/27/2021.

## 2020-12-26 NOTE — Telephone Encounter (Signed)
Will do, thanks

## 2020-12-26 NOTE — Telephone Encounter (Addendum)
**Note De-Identified  Obfuscation** Per the pts [facility] message I have sent his [place] RX to [facility] which is the pharmacy for [facility] pt asst program to fill even though a valid farxiga RX was included in the application we submitted to [place] [facility] [place].  We are leaving the pt a 2 week supply of Farxiga 10 mg samples in the front office for the pt to pick up which should be enough to last until he receives his [place] shipment in the mail.

## 2020-12-26 NOTE — Telephone Encounter (Signed)
**Note De-Identified Kellie Chisolm Obfuscation** The pt has been made aware Romolo Sieling his [facility] message.

## 2020-12-26 NOTE — Progress Notes (Signed)
EPIC Encounter for ICM Monitoring  Patient Name: Austin [name] is a 80 y.o. male Date: 12/26/2020 Primary Care Physican: Lajean Manes, MD Primary Cardiologist:Smith Electrophysiologist: Lovena Le 12/24/2020 Weight:201lbs  Clinical Status (10-Nov-2020 to 24-Dec-2020) AT/AF 1667 Time in AT/AF 14.8 hr/day (61.5%) Longest AT/AF 38 hours    Attempted call to patient and unable to reach.  Left detailed message per DPR regarding transmission. Transmission reviewed.   OptivolThoracic impedancesuggesting normal fluid levels.Pt seen in office on 12/24/2020 for AT/AF and OV note states he remains in [place].    Prescribed:No diuretic (Per Dr Thompson Caul 06/24/2020 OV note, loop diuretic discontinued but may need to be restarted if he has weight gain) Spironolactone 25 mg take 0.5 tablet (12.5 mg total) every Mon, Wed, and Friday.  Labs: 12/24/2020 Creatinine 1.33, BUN 18, Potassium 4.4, Sodium 137, GFR 55 12/04/2020 Creatinine 1.26, BUN 25, Potassium 4.7, Sodium 139, GFR 58  11/05/2020 Creatinine 1.39, BUN 20, Potassium 4.5, Sodium 136, GFR 52  Acomplete set of results can be found in Results Review.  Recommendations:Left voice mail with ICM number and encouraged to call if experiencing any fluid symptoms.  Follow-up plan: ICM clinic phone appointment on7/11/2020. 91 day device clinic remote transmission8/31/2022.   EP/Cardiology Office Visits:8/26/2022with Dr Tamala Julian  Copy of ICM check sent to Dr.Taylor.  3 month ICM trend: 12/24/2020.    1 Year ICM trend:       [name], RN 12/26/2020 4:40 PM

## 2020-12-30 DIAGNOSIS — H2513 Age-related nuclear cataract, bilateral: Secondary | ICD-10-CM | POA: Diagnosis not present

## 2021-01-02 ENCOUNTER — Telehealth: Payer: Self-pay | Admitting: Interventional Cardiology

## 2021-01-02 ENCOUNTER — Ambulatory Visit (INDEPENDENT_AMBULATORY_CARE_PROVIDER_SITE_OTHER): Payer: Medicare Other | Admitting: *Deleted

## 2021-01-02 ENCOUNTER — Other Ambulatory Visit: Payer: Self-pay | Admitting: Internal Medicine

## 2021-01-02 DIAGNOSIS — I4891 Unspecified atrial fibrillation: Secondary | ICD-10-CM

## 2021-01-02 DIAGNOSIS — Z5181 Encounter for therapeutic drug level monitoring: Secondary | ICD-10-CM

## 2021-01-02 LAB — POCT INR: INR: 2.6 (ref 2.0–3.0)

## 2021-01-02 NOTE — Patient Instructions (Signed)
Description   Spoke with pt and advised him to take 1.5 tablets of Warfarin today, then continue taking 1/2 tablet daily except 1 tablet on Mondays and Fridays. Recheck INR in 2 weeks.  Pt's cell phone [phone] in case difficulty reaching pt on home phone.

## 2021-01-02 NOTE — Telephone Encounter (Signed)
Outreach made to Pt.  Per Pt he is still taking carvedilol.  Unable to determine if he is also taking metoprolol succinate at the same time.  Pt states he does not remember being told by RU on 12/24/2020 that he should NOT take carvedilol and should START taking Toprol XL 12.5 mg daily.  While on the phone with Pt, advised Pt to remove carvedilol from his medications.  Advised to put that pill bottle away for now. Pt found his Toprol XL medication.  Advised he should only take this beta blocker, 1/2 tablet by mouth daily.  Advised that Pt's fall sounded hypotensive in nature.  Encouraged Pt to change positions slowly and increase fluid intake.  Pt did not have any injury with fall.    Pt has follow up with Dr. Tamala Julian soon.

## 2021-01-02 NOTE — Telephone Encounter (Signed)
Pt c/o medication issue:  1. Name of Medication: carvedilol (COREG) 3.125 MG tablet  2. How are you currently taking this medication (dosage and times per day)? As directed  3. Are you having a reaction (difficulty breathing--STAT)? yes  4. What is your medication issue? Patient states that he is having muscle fatigue, was lightheaded and fell yesterday - was conscious that he was falling.   Sent this message to scheduling,     Dr. Lovena Le, Dr.Smith, Unfortunately, I fell yesterday morning around 11:00. I was light headed and rested on the couch, then  I got up to answer the doorbell, took 2 steps , and pitched left into the wall, and dropped to the floor. Had scrapes on the back of each arm. I don't know if I passed out, as I was aware of the sequence and looking for something to grab. I'm not sure what to do. Started taking Carvedilol last week. Mussel fatigue has been a real problem for some time. Got up twice last night and did not have a problem. Please advise.   Thanks, Jodene Nam

## 2021-01-04 ENCOUNTER — Other Ambulatory Visit: Payer: Self-pay | Admitting: Interventional Cardiology

## 2021-01-05 NOTE — Telephone Encounter (Signed)
Outpatient Medication Detail   Disp Refills Start End   dapagliflozin propanediol (FARXIGA) 10 MG TABS tablet 90 tablet 3 12/26/2020    Sig - Route: Take 1 tablet (10 mg total) by mouth daily before breakfast. - Oral   Sent to pharmacy as: dapagliflozin propanediol (FARXIGA) 10 MG Tab tablet   Notes to Pharmacy: The patient has been approved for patient assistance through [place]. Please fill and ship ASAP. Thank you.   E-Prescribing Status: Receipt confirmed by pharmacy (12/26/2020  8:10 AM EDT)     Pharmacy  [facility], [place]

## 2021-01-07 NOTE — Telephone Encounter (Signed)
I called the patient with [employer] on the line to help trouble shoot his monitor. [employer] is sending the patient a new handheld and he should receive it in 7-10 business days. I let him know the nurse will give him a call soon.

## 2021-01-09 NOTE — Telephone Encounter (Signed)
Received Mr. Pitter's transmission on 01/09/21. Patient states that he felt bad earlier but is feeling fine now. No symptoms at this time. Patient states compliance with medications including OAC Coumadin 5mg . Routing to Dr. Colette Ribas. ED precautions given if patient should become symptomatic over the weekend.

## 2021-01-12 NOTE — Telephone Encounter (Signed)
Take Entresto once a day at bedtime for one week and let us know if dizziness improves.

## 2021-01-15 ENCOUNTER — Ambulatory Visit (INDEPENDENT_AMBULATORY_CARE_PROVIDER_SITE_OTHER): Payer: Medicare Other | Admitting: *Deleted

## 2021-01-15 DIAGNOSIS — I4891 Unspecified atrial fibrillation: Secondary | ICD-10-CM

## 2021-01-15 DIAGNOSIS — Z5181 Encounter for therapeutic drug level monitoring: Secondary | ICD-10-CM | POA: Diagnosis not present

## 2021-01-15 LAB — POCT INR: INR: 3.8 — AB (ref 2.0–3.0)

## 2021-01-15 NOTE — Patient Instructions (Signed)
Description   Spoke with pt and advised him to hold today's dose then continue taking 1/2 tablet daily except 1 tablet on Mondays and Fridays. Recheck INR in 2 weeks.  Pt's cell phone [phone] in case difficulty reaching pt on home phone.

## 2021-01-16 DIAGNOSIS — E785 Hyperlipidemia, unspecified: Secondary | ICD-10-CM | POA: Diagnosis not present

## 2021-01-16 DIAGNOSIS — I480 Paroxysmal atrial fibrillation: Secondary | ICD-10-CM | POA: Diagnosis not present

## 2021-01-16 DIAGNOSIS — I5022 Chronic systolic (congestive) heart failure: Secondary | ICD-10-CM | POA: Diagnosis not present

## 2021-01-16 DIAGNOSIS — E039 Hypothyroidism, unspecified: Secondary | ICD-10-CM | POA: Diagnosis not present

## 2021-01-16 DIAGNOSIS — G629 Polyneuropathy, unspecified: Secondary | ICD-10-CM | POA: Diagnosis not present

## 2021-01-16 DIAGNOSIS — I129 Hypertensive chronic kidney disease with stage 1 through stage 4 chronic kidney disease, or unspecified chronic kidney disease: Secondary | ICD-10-CM | POA: Diagnosis not present

## 2021-01-16 DIAGNOSIS — Z0000 Encounter for general adult medical examination without abnormal findings: Secondary | ICD-10-CM | POA: Diagnosis not present

## 2021-01-16 DIAGNOSIS — I429 Cardiomyopathy, unspecified: Secondary | ICD-10-CM | POA: Diagnosis not present

## 2021-01-16 DIAGNOSIS — D6869 Other thrombophilia: Secondary | ICD-10-CM | POA: Diagnosis not present

## 2021-01-16 DIAGNOSIS — I472 Ventricular tachycardia: Secondary | ICD-10-CM | POA: Diagnosis not present

## 2021-01-16 DIAGNOSIS — N1831 Chronic kidney disease, stage 3a: Secondary | ICD-10-CM | POA: Diagnosis not present

## 2021-01-16 DIAGNOSIS — Z1389 Encounter for screening for other disorder: Secondary | ICD-10-CM | POA: Diagnosis not present

## 2021-01-16 DIAGNOSIS — Z79899 Other long term (current) drug therapy: Secondary | ICD-10-CM | POA: Diagnosis not present

## 2021-01-16 NOTE — Progress Notes (Signed)
Remote ICD transmission.   

## 2021-01-22 NOTE — Progress Notes (Signed)
Cardiology Office Note:    Date:  01/23/2021   ID:  Austin [name], DOB March 25, 1941, MRN [id number]  PCP:  Austin Manes, MD  Cardiologist:  Austin Grooms, MD   Referring MD: Austin Manes, MD   Chief Complaint  Patient presents with   Congestive Heart Failure     History of Present Illness:    Austin [name] is a 80 y.o. male with a hx of mechanical mitral valve, chronic anticoagulation therapy, nonischemic cardiomyopathy, biventricular implantable cardiac defibrillator, recurrent ventricular tachycardia, amiodarone for VT suppression, multiple medication regimen to suppress ventricular ectopy, and sleep apnea.  Echo performed June 2021 revealed EF of 20 to 99% and systolic heart failure management titrated/added.  Paroxysmal atrial fibrillation 06/19/2020 treated with electrical cardioversion and increase amiodarone loading.  Most recent echo 11/24 reveals improve EF to 35% compared to June-->  We were previously on Entresto twice daily, spironolactone 3 times per week, and carvedilol was ordered but never started because EP felt that metoprolol therapy would help to better control VT burden.  So current heart failure regimen is Entresto, add very low-dose metoprolol succinate.  Wilder Glade will be discontinued today because he has felt worse since this medication was started.  Entresto dose will be left at twice daily.  And spironolactone will be resumed in 72 hours after he evaluates whether or not being off Entresto helps him to feel better.  Since starting Entresto, he feels sluggish, weak, and generally off balance.  States that this feeling becomes amplified after he takes [place] each morning.  He does admit that amiodarone is taken at the same time and he cannot exclude the possibility that it has an impact.  I spoke to him about the poor prognostic implication of intolerance to guideline directed medical therapy.  I have also told him that being evaluated in the advanced heart  failure clinic is very important at this point.  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    cx by infection, explantation11/12 & reimplant 1/13   Conductive hearing loss    Intraspinal abscess    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   Nonischemic cardiomyopathy ([place])    Psychosexual dysfunction with inhibited sexual excitement    S/P mitral valve replacement    Syncope and collapse    Unspecified sleep apnea    last sleep study 11/07   Ventricular tachycardia ([place])    VT (ventricular tachycardia) ([place])     Past Surgical History:  Procedure Laterality Date   BIV ICD GENERATOR CHANGEOUT N/A 05/04/2017   Procedure: BiV ICD Generator Changeout;  Surgeon: Evans Lance, MD;  Location: [facility];  Service: Cardiovascular;  Laterality: N/A;   CARDIAC CATHETERIZATION  04/24/2002   CARDIAC VALVE REPLACEMENT     CARDIOVERSION  03/09/2012   Procedure: CARDIOVERSION;  Surgeon: Evans Lance, MD;  Location: [facility];  Service: Cardiovascular;  Laterality: N/A;   CARDIOVERSION N/A 11/22/2013   Procedure: CARDIOVERSION;  Surgeon: Sanda Klein, MD;  Location: [facility];  Service: Cardiovascular;  Laterality: N/A;   dental implants     ELECTROPHYSIOLOGIC STUDY N/A 09/15/2015   Procedure: Stephanie Coup Ablation;  Surgeon: Evans Lance, MD;  Location: [facility];  Service: Cardiovascular;  Laterality: N/A;   Evacution of epidural lumbar epidural abscess  1999  INSERT / REPLACE / REMOVE PACEMAKER  11/2008   MITRAL VALVE REPLACEMENT     w #[id number] [facility]   PERMANENT PACEMAKER INSERTION N/A 08/05/2011   Procedure: PERMANENT PACEMAKER INSERTION;  Surgeon: Evans Lance, MD;  Location: [facility];  Service: Cardiovascular;  Laterality: N/A;   THYROIDECTOMY     TONSILLECTOMY     VALVE  REPLACEMENT  2000    Current Medications: Current Meds  Medication Sig   acetaminophen (TYLENOL) 500 MG tablet Take 1,000 mg by mouth every 6 (six) hours as needed (pain).   amiodarone (PACERONE) 200 MG tablet Take one tablet by mouth twice a day Monday through Friday.  Take only one tablet by mouth on Saturday and Sunday.   amoxicillin (AMOXIL) 500 MG capsule TAKE 4 CAPSULES BY MOUTH 30 MINUTES PRIOR TO DENTAL WORK   levothyroxine (SYNTHROID) 50 MCG tablet TAKE 1 TABLET BY MOUTH DAILY BEFORE BREAKFAST   Melatonin 1 MG SUBL Place under the tongue.   metoprolol succinate (TOPROL-XL) 25 MG 24 hr tablet Take 0.5 tablets (12.5 mg total) by mouth daily. Take with or immediately following a meal.   Multiple Vitamin (MULTIVITAMIN WITH MINERALS) TABS tablet Take 1 tablet by mouth daily.   nitroGLYCERIN (NITROSTAT) 0.4 MG SL tablet Place 1 tablet (0.4 mg total) under the tongue every 5 (five) minutes x 3 doses as needed for chest pain.   sacubitril-valsartan (ENTRESTO) 24-26 MG Take 1 tablet by mouth 2 (two) times daily.   spironolactone (ALDACTONE) 25 MG tablet Take 0.5 tablets (12.5 mg total) by mouth every Monday, Wednesday, and Friday.   warfarin (COUMADIN) 5 MG tablet TAKE AS DIRECTED BY THE COUMADIN CLINIC   [DISCONTINUED] dapagliflozin propanediol (FARXIGA) 10 MG TABS tablet Take 1 tablet (10 mg total) by mouth daily before breakfast.     Allergies:   Amiodarone and Ezetimibe   Social History   Socioeconomic History   Marital status: Married    Spouse name: Not on file   Number of children: 1   Years of education: 18   Highest education level: Master's degree (e.g., MA, MS, MEng, MEd, MSW, MBA)  Occupational History   Occupation: retired from [employer], manufacturing systems  Tobacco Use   Smoking status: Never   Smokeless tobacco: Never  Vaping Use   Vaping Use: Never used  Substance and Sexual Activity   Alcohol use: Yes    Alcohol/week: 0.0 standard drinks    Comment: occasionally   Drug  use: No   Sexual activity: Not on file  Other Topics Concern   Not on file  Social History Narrative   Lives with wife in a one story home.  Has one daughter.  Retired.  Education: Masters.   Social Determinants of Health   Financial Resource Strain: Not on file  Food Insecurity: Not on file  Transportation Needs: Not on file  Physical Activity: Not on file  Stress: Not on file  Social Connections: Not on file     Family History: The patient's family history includes Heart disease in his father and mother; Heart failure in his father and mother.  ROS:   Please see the history of present illness.    Seems depressed.  No tachycardia palpitations.  Looking forward to an upcoming vacation.  All other systems reviewed and are negative.  EKGs/Labs/Other Studies Reviewed:    The following studies were reviewed today: Echocardiogram 12/10/2020: IMPRESSIONS     1. Slow flowing contrast at the LV apex. No definite discreet thrombus  but flow suggestive of possible sludge. Left ventricular ejection  fraction, by estimation, is 30%. The left ventricle has severely decreased  function. The left ventricle demonstrates   global hypokinesis. There is mild left ventricular hypertrophy. Left  ventricular diastolic parameters are indeterminate.   2. Right ventricular systolic function is normal. The right ventricular  size is normal. There is normal pulmonary artery systolic pressure. The  estimated right ventricular systolic pressure is 25.4 mmHg.   3. Left atrial size was severely dilated.   4. Right atrial size was moderately dilated.   5. The mitral valve has been repaired/replaced. Trivial mitral valve  regurgitation (normal washing jets). The mean mitral valve gradient is 3.0  mmHg with average heart rate of 70 bpm. There is a 33 mm [facility] Jude  mechanical valve present in the mitral  position. Procedure Date: 2000. Echo findings are consistent with normal  structure and function of the  mitral valve prosthesis.   6. Tricuspid valve regurgitation is mild to moderate.   7. The aortic valve is abnormal. There is moderate calcification of the  aortic valve. Aortic valve regurgitation is not visualized. Mild to  moderate aortic valve sclerosis/calcification is present, without any  evidence of aortic stenosis.   8. Aortic dilatation noted. There is borderline dilatation of the aortic  root, measuring 40 mm.   9. The inferior vena cava is normal in size with <50% respiratory  variability, suggesting right atrial pressure of 8 mmHg. 30% 11/2020.  EKG:  EKG not performed  Recent Labs: 06/19/2020: B Natriuretic Peptide 600.9 11/05/2020: NT-Pro BNP 1,191 12/04/2020: Hemoglobin 16.6; Platelets 158 12/24/2020: BUN 18; Creatinine, Ser 1.33; Magnesium 2.3; Potassium 4.4; Sodium 137  Recent Lipid Panel    Component Value Date/Time   CHOL 158 08/23/2015 0543   TRIG 130 08/23/2015 0543   HDL 50 08/23/2015 0543   CHOLHDL 3.2 08/23/2015 0543   VLDL 26 08/23/2015 0543   LDLCALC 82 08/23/2015 0543    Physical Exam:    VS:  BP 116/60   Pulse 74   Ht 6\' 2"  (1.88 m)   Wt 199 lb 3.2 oz (90.4 kg)   SpO2 98%   BMI 25.58 kg/m     Wt Readings from Last 3 Encounters:  01/23/21 199 lb 3.2 oz (90.4 kg)  12/24/20 201 lb 12.8 oz (91.5 kg)  11/05/20 205 lb 12.8 oz (93.4 kg)     GEN: Appears relatively healthy. No acute distress HEENT: Normal NECK: No JVD. LYMPHATICS: No lymphadenopathy CARDIAC: 2/6 systolic murmur left lower sternal border murmur. RRR no gallop, or edema. VASCULAR:  Normal Pulses. No bruits. RESPIRATORY:  Clear to auscultation without rales, wheezing or rhonchi  ABDOMEN: Soft, non-tender, non-distended, No pulsatile mass, MUSCULOSKELETAL: No deformity  SKIN: Warm and dry NEUROLOGIC:  Alert and oriented x 3 PSYCHIATRIC:  Normal affect   ASSESSMENT:    1. Chronic systolic heart failure ([place])   2. Atrial fibrillation, unspecified type ([place])   3. Cardiac  defibrillator in situ   4. ICD (implantable cardioverter-defibrillator) in place   5. S/P AVR   6. NICM (nonischemic cardiomyopathy) ([place])   7. VT (ventricular tachycardia) ([place])   8. Chronic anticoagulation    PLAN:    In order of problems listed above:  Does not feel as well as he would like to and he correlates that with being placed on Farxiga.  When Wilder Glade was started spironolactone was held.  Recently, we have decreased Entresto to once per day.  This  did not help him to feel better.  Plan is to discontinue Farxiga, increase Entresto back to twice daily, and after 72 hours he should resume spironolactone 12.5 mg on Monday, Wednesday, and Friday.  Needs a basic metabolic panel on Thursday, July 7.  He should be referred to the advanced heart failure clinic as he is in not tolerating guideline directed medical therapy.  Inability to tolerate therapy is a poor prognostic indicator.    Complicated situation.  I will speak with Dr. Haroldine Laws and Aundra Dubin to see if they can see him relatively soon.   Medication Adjustments/Labs and Tests Ordered: Current medicines are reviewed at length with the patient today.  Concerns regarding medicines are outlined above.  Orders Placed This Encounter  Procedures   Basic metabolic panel   AMB referral to CHF clinic   Meds ordered this encounter  Medications   spironolactone (ALDACTONE) 25 MG tablet    Sig: Take 0.5 tablets (12.5 mg total) by mouth every Monday, Wednesday, and Friday.    Dispense:  45 tablet    Refill:  3    Patient Instructions  Medication Instructions:  1) STOP Farxiga 2) INCREASE Entresto to twice dialy 3) On Monday, START Spironolactone 12.5mg  once daily on Monday, Wednesday and Friday.  *If you need a refill on your cardiac medications before your next appointment, please call your pharmacy*   Lab Work: BMET on Thursday  If you have labs (blood work) drawn today and your tests are completely normal, you will  receive your results only by: [place] (if you have MyChart) OR A paper copy in the mail If you have any lab test that is abnormal or we need to change your treatment, we will call you to review the results.   Testing/Procedures: None   Follow-Up: At [facility], you and your health needs are our priority.  As part of our continuing mission to provide you with exceptional heart care, we have created designated Provider Care Teams.  These Care Teams include your primary Cardiologist (physician) and Advanced Practice Providers (APPs -  Physician Assistants and Nurse Practitioners) who all work together to provide you with the care you need, when you need it.  We recommend signing up for the patient portal called "MyChart".  Sign up information is provided on this After Visit Summary.  MyChart is used to connect with patients for Virtual Visits (Telemedicine).  Patients are able to view lab/test results, encounter notes, upcoming appointments, etc.  Non-urgent messages can be sent to your provider as well.   To learn more about what you can do with MyChart, go to [email or web address].    Your next appointment:   6 month(s)  The format for your next appointment:   In Person  Provider:   You may see Austin Grooms, MD or one of the following Advanced Practice Providers on your designated Care Team:   Kathyrn Drown, NP   Other Instructions     Signed, Austin Grooms, MD  01/23/2021 12:57 PM    [place]

## 2021-01-23 ENCOUNTER — Encounter: Payer: Self-pay | Admitting: Interventional Cardiology

## 2021-01-23 ENCOUNTER — Ambulatory Visit: Payer: Medicare Other | Admitting: Interventional Cardiology

## 2021-01-23 ENCOUNTER — Other Ambulatory Visit: Payer: Self-pay

## 2021-01-23 VITALS — BP 116/60 | HR 74 | Ht 74.0 in | Wt 199.2 lb

## 2021-01-23 DIAGNOSIS — Z95810 Presence of automatic (implantable) cardiac defibrillator: Secondary | ICD-10-CM

## 2021-01-23 DIAGNOSIS — I428 Other cardiomyopathies: Secondary | ICD-10-CM

## 2021-01-23 DIAGNOSIS — Z7901 Long term (current) use of anticoagulants: Secondary | ICD-10-CM

## 2021-01-23 DIAGNOSIS — I472 Ventricular tachycardia, unspecified: Secondary | ICD-10-CM

## 2021-01-23 DIAGNOSIS — I5022 Chronic systolic (congestive) heart failure: Secondary | ICD-10-CM

## 2021-01-23 DIAGNOSIS — I4891 Unspecified atrial fibrillation: Secondary | ICD-10-CM

## 2021-01-23 DIAGNOSIS — Z952 Presence of prosthetic heart valve: Secondary | ICD-10-CM

## 2021-01-23 MED ORDER — SPIRONOLACTONE 25 MG PO TABS: 12.5000 mg | ORAL_TABLET | ORAL | 3 refills | Status: DC

## 2021-01-23 NOTE — Patient Instructions (Signed)
Medication Instructions:  1) STOP Farxiga 2) INCREASE Entresto to twice dialy 3) On Monday, START Spironolactone 12.5mg  once daily on Monday, Wednesday and Friday.  *If you need a refill on your cardiac medications before your next appointment, please call your pharmacy*   Lab Work: BMET on Thursday  If you have labs (blood work) drawn today and your tests are completely normal, you will receive your results only by: [place] (if you have MyChart) OR A paper copy in the mail If you have any lab test that is abnormal or we need to change your treatment, we will call you to review the results.   Testing/Procedures: None   Follow-Up: At [facility], you and your health needs are our priority.  As part of our continuing mission to provide you with exceptional heart care, we have created designated Provider Care Teams.  These Care Teams include your primary Cardiologist (physician) and Advanced Practice Providers (APPs -  Physician Assistants and Nurse Practitioners) who all work together to provide you with the care you need, when you need it.  We recommend signing up for the patient portal called "MyChart".  Sign up information is provided on this After Visit Summary.  MyChart is used to connect with patients for Virtual Visits (Telemedicine).  Patients are able to view lab/test results, encounter notes, upcoming appointments, etc.  Non-urgent messages can be sent to your provider as well.   To learn more about what you can do with MyChart, go to [email or web address].    Your next appointment:   6 month(s)  The format for your next appointment:   In Person  Provider:   You may see Sinclair Grooms, MD or one of the following Advanced Practice Providers on your designated Care Team:   Kathyrn Drown, NP   Other Instructions

## 2021-01-27 ENCOUNTER — Ambulatory Visit (INDEPENDENT_AMBULATORY_CARE_PROVIDER_SITE_OTHER): Payer: Medicare Other

## 2021-01-27 DIAGNOSIS — Z95810 Presence of automatic (implantable) cardiac defibrillator: Secondary | ICD-10-CM

## 2021-01-27 DIAGNOSIS — I5022 Chronic systolic (congestive) heart failure: Secondary | ICD-10-CM

## 2021-01-28 ENCOUNTER — Other Ambulatory Visit: Payer: Self-pay | Admitting: Interventional Cardiology

## 2021-01-28 ENCOUNTER — Ambulatory Visit: Payer: Medicare Other | Admitting: Podiatrist

## 2021-01-28 ENCOUNTER — Other Ambulatory Visit: Payer: Self-pay

## 2021-01-28 DIAGNOSIS — L609 Nail disorder, unspecified: Secondary | ICD-10-CM

## 2021-01-28 DIAGNOSIS — I739 Peripheral vascular disease, unspecified: Secondary | ICD-10-CM

## 2021-01-28 NOTE — Progress Notes (Signed)
EPIC Encounter for ICM Monitoring  Patient Name: Austin [name] is a 80 y.o. male Date: 01/28/2021 Primary Care Physican: Lajean Manes, MD Primary Cardiologist: [place] Electrophysiologist: Lovena Le 12/24/2020 Weight: 201 lbs    AT/AF 823 Time in AT/AF 15.4 hr/day (64.4%) Longest AT/AF 18 hours            Spoke with patient and heart failure questions reviewed.  Pt asymptomatic for fluid accumulation and feeing well.    Pt referred to Advanced HF clinic and 7/27 will be first visit.   Optivol Thoracic impedance suggesting normal fluid levels.   12/24/2020 OV note states he remains in [place].     Prescribed: No diuretic (Per Dr Thompson Caul 06/24/2020 OV note, loop diuretic discontinued but may need to be restarted if he has weight gain) Spironolactone 25 mg take 0.5 tablet (12.5 mg total) every Mon, Wed, and Friday.   Labs: 12/24/2020 Creatinine 1.33, BUN 18, Potassium 4.4, Sodium 137, GFR 55 12/04/2020 Creatinine 1.26, BUN 25, Potassium 4.7, Sodium 139, GFR 58 11/05/2020 Creatinine 1.39, BUN 20, Potassium 4.5, Sodium 136, GFR 52  A complete set of results can be found in Results Review.   Recommendations:  No changes and encouraged to call if experiencing any fluid symptoms.   Follow-up plan: ICM clinic phone appointment on 03/02/2021.   91 day device clinic remote transmission 03/25/2021.      EP/Cardiology Office Visits:   03/20/2021 with Dr Tamala Julian.  02/18/2021 with Dr Aundra Dubin (1st visit)   Copy of ICM check sent to Dr. Lovena Le.     3 month ICM trend: 01/27/2021.    1 Year ICM trend:       [name], RN 01/28/2021 8:58 AM

## 2021-01-28 NOTE — Patient Instructions (Signed)

## 2021-01-29 ENCOUNTER — Other Ambulatory Visit: Payer: Medicare Other

## 2021-01-29 ENCOUNTER — Ambulatory Visit (INDEPENDENT_AMBULATORY_CARE_PROVIDER_SITE_OTHER): Payer: Medicare Other | Admitting: *Deleted

## 2021-01-29 DIAGNOSIS — I4819 Other persistent atrial fibrillation: Secondary | ICD-10-CM | POA: Diagnosis not present

## 2021-01-29 DIAGNOSIS — I5022 Chronic systolic (congestive) heart failure: Secondary | ICD-10-CM | POA: Diagnosis not present

## 2021-01-29 DIAGNOSIS — Z5181 Encounter for therapeutic drug level monitoring: Secondary | ICD-10-CM

## 2021-01-29 LAB — BASIC METABOLIC PANEL
BUN/Creatinine Ratio: 14 (ref 10–24)
BUN: 18 mg/dL (ref 8–27)
CO2: 18 mmol/L — ABNORMAL LOW (ref 20–29)
Calcium: 8.9 mg/dL (ref 8.6–10.2)
Chloride: 101 mmol/L (ref 96–106)
Creatinine, Ser: 1.3 mg/dL — ABNORMAL HIGH (ref 0.76–1.27)
Glucose: 107 mg/dL — ABNORMAL HIGH (ref 65–99)
Potassium: 4.6 mmol/L (ref 3.5–5.2)
Sodium: 139 mmol/L (ref 134–144)
eGFR: 56 mL/min/{1.73_m2} — ABNORMAL LOW (ref >59)

## 2021-01-29 LAB — POCT INR: INR: 4.6 — AB (ref 2.0–3.0)

## 2021-01-29 NOTE — Patient Instructions (Signed)
Description   Spoke with pt and advised him to hold today's dose then start taking 1/2 tablet daily except 1 tablet on Fridays. Recheck INR in 2 weeks.  Pt's cell phone [phone] in case difficulty reaching pt on home phone.

## 2021-01-30 ENCOUNTER — Telehealth: Payer: Self-pay | Admitting: Interventional Cardiology

## 2021-01-30 NOTE — Telephone Encounter (Signed)
[facility] Remote INR was calling to report an out of range INR for this patient

## 2021-02-04 ENCOUNTER — Encounter (HOSPITAL_COMMUNITY): Payer: Medicare Other | Admitting: Cardiology

## 2021-02-04 ENCOUNTER — Encounter: Payer: Self-pay | Admitting: Podiatrist

## 2021-02-04 NOTE — Progress Notes (Signed)
Chief Complaint  Patient presents with   Nail Problem    Lt 5th nail loose and lifted x 60mo -no injury -no pain/redness/swelling tx: none     HPI: Patient is 80 y.o. male who presents today for a lifted and loose left fifth toenail.  He relates no injury or trauma to the toe.  His left third toenail is also bleeding today after he trimmed his toenails too close before todays visit.  He takes warfarin and bleeds easily when cut.   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   Nonischemic cardiomyopathy ([place])    Postprocedural hypotension 01/10/2016   Subtherapeutic international normalized ratio (INR) 01/10/2016   S/P ablation of ventricular arrhythmia 01/10/2016   Hypothyroidism 10/27/2015   H/O mitral valve replacement with mechanical valve    Encounter for therapeutic drug monitoring 08/20/2013   Cardiac device, implant, or graft infection or inflammation ([place]) 06/28/2011   ICD (implantable cardioverter-defibrillator) in place 56/38/7564   Chronic systolic heart failure ([place]) 04/15/2009   ERECTILE DYSFUNCTION 11/27/2008   ABSCESS, SPINE, EPIDURAL 11/27/2008   HEARING LOSS 11/27/2008   Atrial fibrillation ([place]) 11/27/2008   SYNCOPE 11/27/2008   SLEEP APNEA 11/27/2008    Current Outpatient Medications on File Prior to Visit  Medication Sig Dispense Refill   acetaminophen (TYLENOL) 500 MG tablet Take 1,000 mg by mouth every 6 (six) hours as needed (pain).     amiodarone (PACERONE) 200 MG tablet Take one tablet by mouth twice a day Monday through Friday.  Take only one tablet by mouth on Saturday and Sunday. 180 tablet 3   amoxicillin (AMOXIL) 500 MG capsule TAKE 4 CAPSULES BY MOUTH 30 MINUTES PRIOR TO DENTAL WORK 4 capsule 2   levothyroxine (SYNTHROID) 50 MCG tablet TAKE 1 TABLET BY MOUTH DAILY BEFORE BREAKFAST 90 tablet 2    Melatonin 1 MG SUBL Place under the tongue.     metoprolol succinate (TOPROL-XL) 25 MG 24 hr tablet Take 0.5 tablets (12.5 mg total) by mouth daily. Take with or immediately following a meal. 45 tablet 3   Multiple Vitamin (MULTIVITAMIN WITH MINERALS) TABS tablet Take 1 tablet by mouth daily.     nitroGLYCERIN (NITROSTAT) 0.4 MG SL tablet Place 1 tablet (0.4 mg total) under the tongue every 5 (five) minutes x 3 doses as needed for chest pain. 25 tablet 2   spironolactone (ALDACTONE) 25 MG tablet Take 0.5 tablets (12.5 mg total) by mouth every Monday, Wednesday, and Friday. 45 tablet 3   warfarin (COUMADIN) 5 MG tablet TAKE AS DIRECTED BY THE COUMADIN CLINIC 90 tablet 0   No current facility-administered medications on file prior to visit.    Allergies  Allergen Reactions   Amiodarone Other (See Comments)   Ezetimibe Other (See Comments)    Pt stated, "Upset my stomach"    Review of Systems No fevers, chills, nausea, muscle aches, no difficulty breathing, no calf pain, no chest pain or shortness of breath.   Physical Exam  GENERAL APPEARANCE: Alert, conversant. Appropriately groomed. No acute distress.   VASCULAR: Pedal pulses faintly palpable palpable DP and PT bilateral.  Rubor with dependency noted.  Capillary refill time is decreased.   NEUROLOGIC: sensation is intact to 5.07 monofilament at 5/5 sites bilateral.  Light touch is intact bilateral, vibratory sensation intact bilateral  MUSCULOSKELETAL: acceptable muscle strength, tone and stability bilateral.  No  gross boney pedal deformities noted.  No pain, crepitus or limitation noted with foot and ankle range of motion bilateral.   DERMATOLOGIC: skin is warm, supple, and dry.  Left fifth digital nail is loose and almost detached at the proximal portion of the nail.  The left third toenail is also oozing blood slightly from where it was trimmed too short.  Several toenails are irregularly trimmed bilateral   Assessment   1.  Loose toenail   2. PAD (peripheral artery disease) (HCC)      Plan  Recommended trimming back the left fifth toenail and this was accomplished today with no bleeding post debridement with a sterile nipper.  I applied salinocaine to the oozing toe and silvadene cream with a bandaid.  I trimmed the irregular toenails as a courtesy and smoothed with a power burr.  I recommended he be seen regularly for nail trims due to his circulation issues.  He will call if any concerns arise and will be seen back for routine care in the future.

## 2021-02-06 ENCOUNTER — Other Ambulatory Visit: Payer: Self-pay | Admitting: Interventional Cardiology

## 2021-02-11 DIAGNOSIS — L570 Actinic keratosis: Secondary | ICD-10-CM | POA: Diagnosis not present

## 2021-02-11 DIAGNOSIS — Z85828 Personal history of other malignant neoplasm of skin: Secondary | ICD-10-CM | POA: Diagnosis not present

## 2021-02-11 DIAGNOSIS — L812 Freckles: Secondary | ICD-10-CM | POA: Diagnosis not present

## 2021-02-11 DIAGNOSIS — L821 Other seborrheic keratosis: Secondary | ICD-10-CM | POA: Diagnosis not present

## 2021-02-12 ENCOUNTER — Ambulatory Visit (INDEPENDENT_AMBULATORY_CARE_PROVIDER_SITE_OTHER): Payer: Medicare Other

## 2021-02-12 DIAGNOSIS — I4819 Other persistent atrial fibrillation: Secondary | ICD-10-CM | POA: Diagnosis not present

## 2021-02-12 DIAGNOSIS — Z5181 Encounter for therapeutic drug level monitoring: Secondary | ICD-10-CM

## 2021-02-12 DIAGNOSIS — Z952 Presence of prosthetic heart valve: Secondary | ICD-10-CM | POA: Diagnosis not present

## 2021-02-12 LAB — POCT INR: INR: 2.2 (ref 2.0–3.0)

## 2021-02-12 NOTE — Patient Instructions (Signed)
Description   Spoke with pt and advised him to take 1 tablet today, then start taking 1/2 tablet daily except 1 tablet on Mondays and Fridays. Recheck INR in 2 weeks.  Pt's cell phone [phone] in case difficulty reaching pt on home phone.

## 2021-02-18 ENCOUNTER — Ambulatory Visit (HOSPITAL_COMMUNITY)
Admission: RE | Admit: 2021-02-18 | Discharge: 2021-02-18 | Disposition: A | Discharge status: Home / Self Care | Payer: Medicare Other | Source: Ambulatory Visit | Attending: Cardiology | Admitting: Cardiology

## 2021-02-18 ENCOUNTER — Encounter (HOSPITAL_COMMUNITY): Payer: Self-pay | Admitting: Cardiology

## 2021-02-18 ENCOUNTER — Other Ambulatory Visit: Payer: Self-pay

## 2021-02-18 VITALS — BP 120/80 | HR 64 | Wt 198.8 lb

## 2021-02-18 DIAGNOSIS — I442 Atrioventricular block, complete: Secondary | ICD-10-CM | POA: Insufficient documentation

## 2021-02-18 DIAGNOSIS — Z952 Presence of prosthetic heart valve: Secondary | ICD-10-CM | POA: Diagnosis not present

## 2021-02-18 DIAGNOSIS — Z8249 Family history of ischemic heart disease and other diseases of the circulatory system: Secondary | ICD-10-CM | POA: Diagnosis not present

## 2021-02-18 DIAGNOSIS — Z950 Presence of cardiac pacemaker: Secondary | ICD-10-CM | POA: Insufficient documentation

## 2021-02-18 DIAGNOSIS — Z7901 Long term (current) use of anticoagulants: Secondary | ICD-10-CM | POA: Diagnosis not present

## 2021-02-18 DIAGNOSIS — I5022 Chronic systolic (congestive) heart failure: Secondary | ICD-10-CM | POA: Diagnosis not present

## 2021-02-18 DIAGNOSIS — I4819 Other persistent atrial fibrillation: Secondary | ICD-10-CM | POA: Diagnosis not present

## 2021-02-18 DIAGNOSIS — Z79899 Other long term (current) drug therapy: Secondary | ICD-10-CM | POA: Insufficient documentation

## 2021-02-18 LAB — BASIC METABOLIC PANEL
Anion gap: 6 (ref 5–15)
BUN: 20 mg/dL (ref 8–23)
CO2: 25 mmol/L (ref 22–32)
Calcium: 8.9 mg/dL (ref 8.9–10.3)
Chloride: 103 mmol/L (ref 98–111)
Creatinine, Ser: 1.2 mg/dL (ref 0.61–1.24)
GFR, Estimated: >60 mL/min (ref >60)
Glucose, Bld: 96 mg/dL (ref 70–99)
Potassium: 4.7 mmol/L (ref 3.5–5.1)
Sodium: 134 mmol/L — ABNORMAL LOW (ref 135–145)

## 2021-02-18 LAB — BRAIN NATRIURETIC PEPTIDE: B Natriuretic Peptide: 761 pg/mL — ABNORMAL HIGH (ref 0.0–100.0)

## 2021-02-18 MED ORDER — FUROSEMIDE 20 MG PO TABS: 20.0000 mg | ORAL_TABLET | ORAL | 11 refills | Status: DC

## 2021-02-18 MED ORDER — DIGOXIN 125 MCG PO TABS
0.1250 mg | ORAL_TABLET | Freq: Every day | ORAL | 3 refills | Status: DC
Start: 2021-02-18 — End: 2021-03-11

## 2021-02-18 NOTE — Progress Notes (Signed)
PCP: Lajean Manes, MD Cardiology: Dr. Tamala Julian HF Cardiology: Dr. Aundra Dubin EP: Dr. Lovena Le  80 y.o. with history of mechanical mitral valve s/p MV endocarditis, VT, nonischemic cardiomyopathy, complete heart block was referred by Dr. Tamala Julian for evaluation of CHF.  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 He subsequently developed a presumed nonischemic cardiomyopathy.  This was complicated by VT, he had a VT ablation in 2017 and had a Medtronic CRT-D device implanted. This had to be removed due to infection in 11/12 and was reimplanted in 1/13. Last echo in 5/22 showed EF 30% with normal RV, mechanical MV stable with mean gradient 3 mmHg.  He has been in atrial fibrillation persistently since late 2021.  He is on amiodarone and has been cardioverted in the past. His LV lead is not functional.  He has complete heart block and is persistently RV pacing.   Over the last few months, patient has struggled with fatigue and dyspnea.  Dr. Tamala Julian has been trying to get him on GDMT, but he has had difficulty tolerating meds.  He was unable to tolerate [place] and is now off.  He is not on Lasix. SBP gets low at times, usually 90s-100s, but is higher today in the office.  BP has limited medication titration.  He gets short of breath walking to the mailbox or with showering. He has had a mild rise in weight.  No chest pain.  No orthopnea/PND.   Medtronic device interrogation: Chronic atrial fibrillation, fluid index nearing threshold.   ECG (personally reviewed): Atrial fibrillation, RV pacing  Labs (7/22): K 4.6, creatinine 1.3  PMH: 1. 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 2. H/o VT: VT ablation in 2017.  3. Atrial fibrillation: Persistent since 2021.  Failed amiodarone.  4. PAD: Tibial occlusive disease.  5. 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  6. Hypothyroidism.  7. Chronic systolic CHF: Presumed nonischemic cardiomyopathy.  Medtronic CRT-D  device, LV lead is nonfunctional.  - Echo (2012): EF 45% - Echo (1/17): EF 30-35% - Echo (6/21): EF 15-20% - Echo (11/21): EF 30-35%  - Echo (5/22): EF 30%, normal RV, mechanical mitral valve mean gradient 3 mmHg.   Social History   Socioeconomic History   Marital status: Married    Spouse name: Not on file   Number of children: 1   Years of education: 62   Highest education level: Master's degree (e.g., MA, MS, MEng, MEd, MSW, MBA)  Occupational History   Occupation: retired from [employer], manufacturing systems  Tobacco Use   Smoking status: Never   Smokeless tobacco: Never  Vaping Use   Vaping Use: Never used  Substance and Sexual Activity   Alcohol use: Yes    Alcohol/week: 0.0 standard drinks    Comment: occasionally   Drug use: No   Sexual activity: Not on file  Other Topics Concern   Not on file  Social History Narrative   Lives with wife in a one story home.  Has one daughter.  Retired.  Education: Masters.   Social Determinants of Health   Financial Resource Strain: Not on file  Food Insecurity: Not on file  Transportation Needs: Not on file  Physical Activity: Not on file  Stress: Not on file  Social Connections: Not on file  Intimate Partner Violence: Not on file   Family History  Problem Relation Age of Onset   Heart disease Mother    Heart failure Mother    Heart disease Father  Heart failure Father    ROS: All systems reviewed and negative except as per HPI.   Current Outpatient Medications  Medication Sig Dispense Refill   acetaminophen (TYLENOL) 500 MG tablet Take 1,000 mg by mouth every 6 (six) hours as needed (pain).     amiodarone (PACERONE) 200 MG tablet Take one tablet by mouth twice a day Monday through Friday.  Take only one tablet by mouth on Saturday and Sunday. 180 tablet 3   amoxicillin (AMOXIL) 500 MG capsule TAKE 4 CAPSULES BY MOUTH 30 MINUTES PRIOR TO DENTAL WORK 4 capsule 2   digoxin (LANOXIN) 0.125 MG tablet Take 1 tablet (0.125 mg total)  by mouth daily. 90 tablet 3   furosemide (LASIX) 20 MG tablet Take 1 tablet (20 mg total) by mouth every other day. 15 tablet 11   levothyroxine (SYNTHROID) 50 MCG tablet TAKE 1 TABLET BY MOUTH DAILY BEFORE BREAKFAST 90 tablet 2   Melatonin 1 MG SUBL Place under the tongue.     metoprolol succinate (TOPROL-XL) 25 MG 24 hr tablet Take 0.5 tablets (12.5 mg total) by mouth daily. Take with or immediately following a meal. 45 tablet 3   Multiple Vitamin (MULTIVITAMIN WITH MINERALS) TABS tablet Take 1 tablet by mouth daily.     nitroGLYCERIN (NITROSTAT) 0.4 MG SL tablet Place 1 tablet (0.4 mg total) under the tongue every 5 (five) minutes x 3 doses as needed for chest pain. 25 tablet 2   sacubitril-valsartan (ENTRESTO) 24-26 MG TAKE 1 TABLET BY MOUTH TWICE A DAY 60 tablet 6   warfarin (COUMADIN) 5 MG tablet TAKE AS DIRECTED BY THE COUMADIN CLINIC 90 tablet 0   No current facility-administered medications for this encounter.   BP 120/80   Pulse 64   Wt 90.2 kg (198 lb 12.8 oz)   SpO2 95%   BMI 25.52 kg/m  General: NAD Neck: JVP 8-9 cm, no thyromegaly or thyroid nodule.  Lungs: Clear to auscultation bilaterally with normal respiratory effort. CV: Nondisplaced PMI.  Heart regular with mechanical S2, no S3/S4, 2/6 SEM RUSB.  Trace ankle edema.  No carotid bruit.  Normal pedal pulses.  Abdomen: Soft, nontender, no hepatosplenomegaly, no distention.  Skin: Intact without lesions or rashes.  Neurologic: Alert and oriented x 3.  Psych: Normal affect. Extremities: No clubbing or cyanosis.  HEENT: Normal.   Assessment/Plan: 1. Chronic systolic CHF: Long-standing, suspected nonischemic cardiomyopathy.  Last echo in 5/22 with EF 30%, normal RV, mechanical mitral valve mean gradient 3 mmHg.  He has a Medtronic CRT-D device present, but the LV lead is not functional.  He has complete heart block, so is RV paced chronically.  He has been in atrial fibrillation for about a year.  NYHA class III symptoms,  prominent fatigue and difficulty tolerating GDMT.  He is mildly volume overloaded by exam and by Optivol.  - Start gentle Lasix 20 mg qod.  BMET today and in 10 days. - Given concern for low output, I will add digoxin 0.125 daily.  - Continue Entresto 24/26 bid, probably does not have BP room to increase (runs low at home with lightheadedness).  - Continue Toprol XL 12.5 mg daily.  - I will arrange for RHC next week to assess filling pressures and cardiac output.  Based on symptoms, I am concerned for possible low output.  We discussed risks/benefits and he agrees to procedure.  - I discussed RV pacing with Dr. Lovena Le.  The patient has CHB and is chronically RV pacing as his  LV lead is nonfunctional.  This is not ideal and creates dyssynchrony.  I will do RHC and cut back on amiodarone.  If the patient does not feel better, Dr. Lovena Le will consider placement of left bundle lead.  2. Atrial fibrillation: Persistent since 2021.  I discussed AF with Dr. Lovena Le. He does not think that the patient will hold NSR even with amiodarone use.  Recommended against repeat DCCV as unlikely to hold NSR.  - Continue Warfarin.  3. Mechanical mitral valve: Valve looked normal on 5/22 echo.  - Continue warfarin, INR goal 3-3.5.  - He has not been on ASA.  4. VT: History of VT and VT ablation in 2017.  MDT ICD.  He is on amiodarone.  I wonder if amiodarone is causing some of his low output-type symptoms like fatigue and nausea.  - Discussed with Dr. Lovena Le, will decrease amiodarone to 100 mg daily.  5. Complete heart block: Per notes, patient is pacer dependent due to CHB.  Currently RV pacing chronically.   Followup in 2-3 weeks post-RHC.   Loralie Champagne 02/19/2021

## 2021-02-18 NOTE — H&P (View-Only) (Signed)
PCP: Lajean Manes, MD Cardiology: Dr. Tamala Julian HF Cardiology: Dr. Aundra Dubin EP: Dr. Lovena Le  80 y.o. with history of mechanical mitral valve s/p MV endocarditis, VT, nonischemic cardiomyopathy, complete heart block was referred by Dr. Tamala Julian for evaluation of CHF.  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 He subsequently developed a presumed nonischemic cardiomyopathy.  This was complicated by VT, he had a VT ablation in 2017 and had a Medtronic CRT-D device implanted. This had to be removed due to infection in 11/12 and was reimplanted in 1/13. Last echo in 5/22 showed EF 30% with normal RV, mechanical MV stable with mean gradient 3 mmHg.  He has been in atrial fibrillation persistently since late 2021.  He is on amiodarone and has been cardioverted in the past. His LV lead is not functional.  He has complete heart block and is persistently RV pacing.   Over the last few months, patient has struggled with fatigue and dyspnea.  Dr. Tamala Julian has been trying to get him on GDMT, but he has had difficulty tolerating meds.  He was unable to tolerate [place] and is now off.  He is not on Lasix. SBP gets low at times, usually 90s-100s, but is higher today in the office.  BP has limited medication titration.  He gets short of breath walking to the mailbox or with showering. He has had a mild rise in weight.  No chest pain.  No orthopnea/PND.   Medtronic device interrogation: Chronic atrial fibrillation, fluid index nearing threshold.   ECG (personally reviewed): Atrial fibrillation, RV pacing  Labs (7/22): K 4.6, creatinine 1.3  PMH: 1. 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 2. H/o VT: VT ablation in 2017.  3. Atrial fibrillation: Persistent since 2021.  Failed amiodarone.  4. PAD: Tibial occlusive disease.  5. 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  6. Hypothyroidism.  7. Chronic systolic CHF: Presumed nonischemic cardiomyopathy.  Medtronic CRT-D  device, LV lead is nonfunctional.  - Echo (2012): EF 45% - Echo (1/17): EF 30-35% - Echo (6/21): EF 15-20% - Echo (11/21): EF 30-35%  - Echo (5/22): EF 30%, normal RV, mechanical mitral valve mean gradient 3 mmHg.   Social History   Socioeconomic History   Marital status: Married    Spouse name: Not on file   Number of children: 1   Years of education: 79   Highest education level: Master's degree (e.g., MA, MS, MEng, MEd, MSW, MBA)  Occupational History   Occupation: retired from [employer], manufacturing systems  Tobacco Use   Smoking status: Never   Smokeless tobacco: Never  Vaping Use   Vaping Use: Never used  Substance and Sexual Activity   Alcohol use: Yes    Alcohol/week: 0.0 standard drinks    Comment: occasionally   Drug use: No   Sexual activity: Not on file  Other Topics Concern   Not on file  Social History Narrative   Lives with wife in a one story home.  Has one daughter.  Retired.  Education: Masters.   Social Determinants of Health   Financial Resource Strain: Not on file  Food Insecurity: Not on file  Transportation Needs: Not on file  Physical Activity: Not on file  Stress: Not on file  Social Connections: Not on file  Intimate Partner Violence: Not on file   Family History  Problem Relation Age of Onset   Heart disease Mother    Heart failure Mother    Heart disease Father  Heart failure Father    ROS: All systems reviewed and negative except as per HPI.   Current Outpatient Medications  Medication Sig Dispense Refill   acetaminophen (TYLENOL) 500 MG tablet Take 1,000 mg by mouth every 6 (six) hours as needed (pain).     amiodarone (PACERONE) 200 MG tablet Take one tablet by mouth twice a day Monday through Friday.  Take only one tablet by mouth on Saturday and Sunday. 180 tablet 3   amoxicillin (AMOXIL) 500 MG capsule TAKE 4 CAPSULES BY MOUTH 30 MINUTES PRIOR TO DENTAL WORK 4 capsule 2   digoxin (LANOXIN) 0.125 MG tablet Take 1 tablet (0.125 mg total)  by mouth daily. 90 tablet 3   furosemide (LASIX) 20 MG tablet Take 1 tablet (20 mg total) by mouth every other day. 15 tablet 11   levothyroxine (SYNTHROID) 50 MCG tablet TAKE 1 TABLET BY MOUTH DAILY BEFORE BREAKFAST 90 tablet 2   Melatonin 1 MG SUBL Place under the tongue.     metoprolol succinate (TOPROL-XL) 25 MG 24 hr tablet Take 0.5 tablets (12.5 mg total) by mouth daily. Take with or immediately following a meal. 45 tablet 3   Multiple Vitamin (MULTIVITAMIN WITH MINERALS) TABS tablet Take 1 tablet by mouth daily.     nitroGLYCERIN (NITROSTAT) 0.4 MG SL tablet Place 1 tablet (0.4 mg total) under the tongue every 5 (five) minutes x 3 doses as needed for chest pain. 25 tablet 2   sacubitril-valsartan (ENTRESTO) 24-26 MG TAKE 1 TABLET BY MOUTH TWICE A DAY 60 tablet 6   warfarin (COUMADIN) 5 MG tablet TAKE AS DIRECTED BY THE COUMADIN CLINIC 90 tablet 0   No current facility-administered medications for this encounter.   BP 120/80   Pulse 64   Wt 90.2 kg (198 lb 12.8 oz)   SpO2 95%   BMI 25.52 kg/m  General: NAD Neck: JVP 8-9 cm, no thyromegaly or thyroid nodule.  Lungs: Clear to auscultation bilaterally with normal respiratory effort. CV: Nondisplaced PMI.  Heart regular with mechanical S2, no S3/S4, 2/6 SEM RUSB.  Trace ankle edema.  No carotid bruit.  Normal pedal pulses.  Abdomen: Soft, nontender, no hepatosplenomegaly, no distention.  Skin: Intact without lesions or rashes.  Neurologic: Alert and oriented x 3.  Psych: Normal affect. Extremities: No clubbing or cyanosis.  HEENT: Normal.   Assessment/Plan: 1. Chronic systolic CHF: Long-standing, suspected nonischemic cardiomyopathy.  Last echo in 5/22 with EF 30%, normal RV, mechanical mitral valve mean gradient 3 mmHg.  He has a Medtronic CRT-D device present, but the LV lead is not functional.  He has complete heart block, so is RV paced chronically.  He has been in atrial fibrillation for about a year.  NYHA class III symptoms,  prominent fatigue and difficulty tolerating GDMT.  He is mildly volume overloaded by exam and by Optivol.  - Start gentle Lasix 20 mg qod.  BMET today and in 10 days. - Given concern for low output, I will add digoxin 0.125 daily.  - Continue Entresto 24/26 bid, probably does not have BP room to increase (runs low at home with lightheadedness).  - Continue Toprol XL 12.5 mg daily.  - I will arrange for RHC next week to assess filling pressures and cardiac output.  Based on symptoms, I am concerned for possible low output.  We discussed risks/benefits and he agrees to procedure.  - I discussed RV pacing with Dr. Lovena Le.  The patient has CHB and is chronically RV pacing as his  LV lead is nonfunctional.  This is not ideal and creates dyssynchrony.  I will do RHC and cut back on amiodarone.  If the patient does not feel better, Dr. Lovena Le will consider placement of left bundle lead.  2. Atrial fibrillation: Persistent since 2021.  I discussed AF with Dr. Lovena Le. He does not think that the patient will hold NSR even with amiodarone use.  Recommended against repeat DCCV as unlikely to hold NSR.  - Continue Warfarin.  3. Mechanical mitral valve: Valve looked normal on 5/22 echo.  - Continue warfarin, INR goal 3-3.5.  - He has not been on ASA.  4. VT: History of VT and VT ablation in 2017.  MDT ICD.  He is on amiodarone.  I wonder if amiodarone is causing some of his low output-type symptoms like fatigue and nausea.  - Discussed with Dr. Lovena Le, will decrease amiodarone to 100 mg daily.  5. Complete heart block: Per notes, patient is pacer dependent due to CHB.  Currently RV pacing chronically.   Followup in 2-3 weeks post-RHC.   Loralie Champagne 02/19/2021

## 2021-02-18 NOTE — Patient Instructions (Addendum)
EKG done today.  Labs done today. We will contact you only if your labs are abnormal.  START Digoxin 0.'125mg'$  (1 tablet) by mouth daily.  START Lasix '20mg'$  (1 tablet) by mouth every other day.  No other medication changes were made. Please continue all current medications as prescribed.  Your physician recommends that you schedule a follow-up appointment in: 2 weeks with Dr. Aundra Dubin  If you have any questions or concerns before your next appointment please send [place] a message through [facility] or call our office at [phone].    TO LEAVE A MESSAGE FOR THE NURSE SELECT OPTION 2, PLEASE LEAVE A MESSAGE INCLUDING: YOUR NAME DATE OF BIRTH CALL BACK NUMBER REASON FOR CALL**this is important as we prioritize the call backs  YOU WILL RECEIVE A CALL BACK THE SAME DAY AS LONG AS YOU CALL BEFORE 4:00 PM   Do the following things EVERYDAY: Weigh yourself in the morning before breakfast. Write it down and keep it in a log. Take your medicines as prescribed Eat low salt foods--Limit salt (sodium) to 2000 mg per day.  Stay as active as you can everyday Limit all fluids for the day to less than 2 liters   At the [facility], you and your health needs are our priority. As part of our continuing mission to provide you with exceptional heart care, we have created designated Provider Care Teams. These Care Teams include your primary Cardiologist (physician) and Advanced Practice Providers (APPs- Physician Assistants and Nurse Practitioners) who all work together to provide you with the care you need, when you need it.   You may see any of the following providers on your designated Care Team at your next follow up: Dr Glori Bickers Dr Haynes Kerns, NP Lyda Jester, [place] Audry Riles, PharmD   Please be sure to bring in all your medications bottles to every appointment.

## 2021-02-19 ENCOUNTER — Other Ambulatory Visit: Payer: Self-pay | Admitting: Internal Medicine

## 2021-02-19 MED ORDER — LEVOTHYROXINE SODIUM 50 MCG PO TABS: 50.0000 ug | ORAL_TABLET | Freq: Every day | ORAL | 2 refills | Status: DC

## 2021-02-19 MED ORDER — AMOXICILLIN 500 MG PO CAPS: ORAL_CAPSULE | ORAL | 2 refills | Status: DC

## 2021-02-19 NOTE — Telephone Encounter (Signed)
I have sent in refill for Amoxicillin, however, Dr. Lovena Le will have to approve the Levothyroxine.  Will route to him / his RN (which is off today) to advise.

## 2021-02-19 NOTE — Telephone Encounter (Signed)
*  STAT* If patient is at the pharmacy, call can be transferred to refill team.   1. Which medications need to be refilled? (please list name of each medication and dose if known)  levothyroxine (SYNTHROID) 50 MCG tablet amoxicillin (AMOXIL) 500 MG capsule  2. Which pharmacy/location (including street and city if local pharmacy) is medication to be sent to? [id number] [place], [place] - [address]. Do they need a 30 day or 90 day supply? 90 with refills for levothyroxine.8 tablets of amoxicillin. The patient takes 4 tablets prior to each dental procedure

## 2021-02-19 NOTE — Telephone Encounter (Signed)
Refill for Synthroid sent in

## 2021-02-24 ENCOUNTER — Telehealth (HOSPITAL_COMMUNITY): Payer: Self-pay | Admitting: *Deleted

## 2021-02-24 MED ORDER — AMIODARONE HCL 200 MG PO TABS: 100.0000 mg | ORAL_TABLET | Freq: Every day | ORAL | 3 refills | Status: DC

## 2021-02-24 NOTE — Telephone Encounter (Signed)
-----   Message from Larey Dresser, MD sent at 02/19/2021 12:11 AM EDT ----- Please call patient: 1. I discussed RHC with Dr. Tamala Julian.  He agrees, would like to set patient up for [place] next week.  Would ask coumadin clinic to try to keep his INR in the 2.5 range pre-RHC, then can increase back to his goal 3-3.5.  2. I spoke with Dr. Lovena Le. He does not think Mr Burczyk can successfully be cardioverted out of atrial fibrillation so would not attempt it.  We want to decrease Mr Nickles's amiodarone to 100 mg daily to see if this helps him feel better. Based on RHC, we will consider device upgrade with left bundle lead.

## 2021-02-24 NOTE — Telephone Encounter (Signed)
This RN was on vacation until today, have called pt and discussed, he is agreeable to proceed with RHC and is happy to decrease Amio. Advised pt we will plan for [place] 8/4 PM, I will call him back tomorrow with time and instructions, he is agreeable.

## 2021-02-25 ENCOUNTER — Other Ambulatory Visit (HOSPITAL_COMMUNITY): Payer: Self-pay | Admitting: *Deleted

## 2021-02-25 ENCOUNTER — Telehealth: Payer: Self-pay | Admitting: *Deleted

## 2021-02-25 ENCOUNTER — Ambulatory Visit (INDEPENDENT_AMBULATORY_CARE_PROVIDER_SITE_OTHER): Payer: Medicare Other | Admitting: *Deleted

## 2021-02-25 DIAGNOSIS — I4819 Other persistent atrial fibrillation: Secondary | ICD-10-CM

## 2021-02-25 DIAGNOSIS — I5022 Chronic systolic (congestive) heart failure: Secondary | ICD-10-CM

## 2021-02-25 DIAGNOSIS — Z5181 Encounter for therapeutic drug level monitoring: Secondary | ICD-10-CM | POA: Diagnosis not present

## 2021-02-25 LAB — POCT INR: INR: 2.5 (ref 2.0–3.0)

## 2021-02-25 NOTE — Telephone Encounter (Signed)
Left message at 825am, for the pt to call back regarding checking INR today.  Called pt again and spoke with him, made him aware that we need INR to be in the range of 2.5 per Dr. Mclean/[facility] Clinic. Asked if he could check  INR and he stated he would and send it in soon. Advised we need so we can address INR today. Will await results.

## 2021-02-25 NOTE — Telephone Encounter (Signed)
-----   Message from Scarlette Calico, RN sent at 02/24/2021  5:30 PM EDT ----- Austin [name], see below regarding INR for cath, I was on vacay until today so sorry it's last minute but I got him sch for cath on Thur 8/4 at 4:30 PM can you please advise on coumadin dose and if he should come there prior for INR of if I should just do one stat at hospital please, thanks  ----- Message ----- From: Larey Dresser, MD Sent: 02/19/2021  12:14 AM EDT To: Larey Dresser, MD, Scarlette Calico, RN, #  Please call patient: 1. I discussed RHC with Dr. Tamala Julian.  He agrees, would like to set patient up for [place] next week.  Would ask coumadin clinic to try to keep his INR in the 2.5 range pre-RHC, then can increase back to his goal 3-3.5.  2. I spoke with Dr. Lovena Le. He does not think [name] can successfully be cardioverted out of atrial fibrillation so would not attempt it.  We want to decrease [name]'s amiodarone to 100 mg daily to see if this helps him feel better. Based on RHC, we will consider device upgrade with left bundle lead.

## 2021-02-25 NOTE — Telephone Encounter (Signed)
Spoke w/pt and reviewed RHC instructions, pt is agreeable and verbalized understanding, he states he has not heard from [facility] yet regarding coumadin dosing, advised if he doesn't hear from them soon he should call them to discuss, pt agreeable.

## 2021-02-25 NOTE — Telephone Encounter (Signed)
Spoke with pt and his INR is 2.5; he will only take 2.'5mg'$  tonight and after procedure tomorrow he will take '5mg'$  then resume normal dose of Warfarin. Will recheck INR 1 week from today.Pt verbalized understanding.

## 2021-02-25 NOTE — Telephone Encounter (Signed)
Called pt after receiving a message stating pt needs INR in 2.5 range per [facility]; there was no answer and voicemail had not been set up. Will try back at a later time.

## 2021-02-25 NOTE — Telephone Encounter (Signed)
Spoke with pt and reviewed information from Dr. Aundra Dubin.  Pt in agreement with plan.

## 2021-02-26 ENCOUNTER — Ambulatory Visit (HOSPITAL_COMMUNITY)
Admission: RE | Admit: 2021-02-26 | Discharge: 2021-02-26 | Disposition: A | Discharge status: Home / Self Care | Payer: Medicare Other | Attending: Cardiology | Admitting: Cardiology

## 2021-02-26 ENCOUNTER — Other Ambulatory Visit: Payer: Self-pay

## 2021-02-26 ENCOUNTER — Encounter (HOSPITAL_COMMUNITY)
Admission: RE | Disposition: A | Discharge status: Home / Self Care | Payer: Self-pay | Source: Home / Self Care | Attending: Cardiology

## 2021-02-26 ENCOUNTER — Encounter (HOSPITAL_COMMUNITY): Payer: Self-pay | Admitting: Cardiology

## 2021-02-26 DIAGNOSIS — I4819 Other persistent atrial fibrillation: Secondary | ICD-10-CM | POA: Diagnosis not present

## 2021-02-26 DIAGNOSIS — I442 Atrioventricular block, complete: Secondary | ICD-10-CM | POA: Diagnosis not present

## 2021-02-26 DIAGNOSIS — Z7901 Long term (current) use of anticoagulants: Secondary | ICD-10-CM | POA: Insufficient documentation

## 2021-02-26 DIAGNOSIS — I5022 Chronic systolic (congestive) heart failure: Secondary | ICD-10-CM

## 2021-02-26 DIAGNOSIS — Z950 Presence of cardiac pacemaker: Secondary | ICD-10-CM | POA: Diagnosis not present

## 2021-02-26 DIAGNOSIS — Z8249 Family history of ischemic heart disease and other diseases of the circulatory system: Secondary | ICD-10-CM | POA: Diagnosis not present

## 2021-02-26 DIAGNOSIS — Z79899 Other long term (current) drug therapy: Secondary | ICD-10-CM | POA: Diagnosis not present

## 2021-02-26 DIAGNOSIS — Z79890 Hormone replacement therapy (postmenopausal): Secondary | ICD-10-CM | POA: Diagnosis not present

## 2021-02-26 DIAGNOSIS — E039 Hypothyroidism, unspecified: Secondary | ICD-10-CM | POA: Insufficient documentation

## 2021-02-26 DIAGNOSIS — Z952 Presence of prosthetic heart valve: Secondary | ICD-10-CM | POA: Insufficient documentation

## 2021-02-26 HISTORY — PX: RIGHT HEART CATH: CATH118263

## 2021-02-26 LAB — POCT I-STAT 7, (LYTES, BLD GAS, ICA,H+H)
Acid-Base Excess: 0 mmol/L (ref 0.0–2.0)
Acid-base deficit: 1 mmol/L (ref 0.0–2.0)
Bicarbonate: 25.1 mmol/L (ref 20.0–28.0)
Bicarbonate: 25.2 mmol/L (ref 20.0–28.0)
Calcium, Ion: 1.17 mmol/L (ref 1.15–1.40)
Calcium, Ion: 1.2 mmol/L (ref 1.15–1.40)
HCT: 46 % (ref 39.0–52.0)
HCT: 46 % (ref 39.0–52.0)
Hemoglobin: 15.6 g/dL (ref 13.0–17.0)
Hemoglobin: 15.6 g/dL (ref 13.0–17.0)
O2 Saturation: 71 %
O2 Saturation: 71 %
Potassium: 3.9 mmol/L (ref 3.5–5.1)
Potassium: 4 mmol/L (ref 3.5–5.1)
Sodium: 139 mmol/L (ref 135–145)
Sodium: 140 mmol/L (ref 135–145)
TCO2: 26 mmol/L (ref 22–32)
TCO2: 27 mmol/L (ref 22–32)
pCO2 arterial: 43.4 mmHg (ref 32.0–48.0)
pCO2 arterial: 43.4 mmHg (ref 32.0–48.0)
pH, Arterial: 7.369 (ref 7.350–7.450)
pH, Arterial: 7.372 (ref 7.350–7.450)
pO2, Arterial: 38 mmHg — CL (ref 83.0–108.0)
pO2, Arterial: 38 mmHg — CL (ref 83.0–108.0)

## 2021-02-26 LAB — DIGOXIN LEVEL: Digoxin Level: 1 ng/mL (ref 0.8–2.0)

## 2021-02-26 LAB — BASIC METABOLIC PANEL
Anion gap: 7 (ref 5–15)
BUN: 23 mg/dL (ref 8–23)
CO2: 25 mmol/L (ref 22–32)
Calcium: 8.8 mg/dL — ABNORMAL LOW (ref 8.9–10.3)
Chloride: 105 mmol/L (ref 98–111)
Creatinine, Ser: 1.25 mg/dL — ABNORMAL HIGH (ref 0.61–1.24)
GFR, Estimated: 59 mL/min — ABNORMAL LOW (ref >60)
Glucose, Bld: 92 mg/dL (ref 70–99)
Potassium: 4 mmol/L (ref 3.5–5.1)
Sodium: 137 mmol/L (ref 135–145)

## 2021-02-26 LAB — CBC
HCT: 50.5 % (ref 39.0–52.0)
Hemoglobin: 16.6 g/dL (ref 13.0–17.0)
MCH: 31.3 pg (ref 26.0–34.0)
MCHC: 32.9 g/dL (ref 30.0–36.0)
MCV: 95.3 fL (ref 80.0–100.0)
Platelets: 178 10*3/uL (ref 150–400)
RBC: 5.3 MIL/uL (ref 4.22–5.81)
RDW: 14.8 % (ref 11.5–15.5)
WBC: 8.9 10*3/uL (ref 4.0–10.5)
nRBC: 0 % (ref 0.0–0.2)

## 2021-02-26 LAB — PROTIME-INR
INR: 2.2 — ABNORMAL HIGH (ref 0.8–1.2)
Prothrombin Time: 24 seconds — ABNORMAL HIGH (ref 11.4–15.2)

## 2021-02-26 SURGERY — RIGHT HEART CATH
Anesthesia: LOCAL

## 2021-02-26 MED ORDER — SODIUM CHLORIDE 0.9% FLUSH: 3.0000 mL | Freq: Two times a day (BID) | INTRAVENOUS | Status: DC

## 2021-02-26 MED ORDER — LIDOCAINE HCL (PF) 1 % IJ SOLN
INTRAMUSCULAR | Status: DC | PRN
  Administered 2021-02-26: 2 mL

## 2021-02-26 MED ORDER — HYDRALAZINE HCL 20 MG/ML IJ SOLN: 10.0000 mg | INTRAMUSCULAR | Status: DC | PRN

## 2021-02-26 MED ORDER — ASPIRIN 81 MG PO CHEW
CHEWABLE_TABLET | ORAL | Status: AC
  Filled 2021-02-26: qty 1

## 2021-02-26 MED ORDER — FUROSEMIDE 20 MG PO TABS: 20.0000 mg | ORAL_TABLET | ORAL | 11 refills | Status: DC

## 2021-02-26 MED ORDER — SODIUM CHLORIDE 0.9 % IV SOLN: INTRAVENOUS | Status: DC

## 2021-02-26 MED ORDER — HEPARIN (PORCINE) IN NACL 1000-0.9 UT/500ML-% IV SOLN
INTRAVENOUS | Status: DC | PRN
  Administered 2021-02-26: 500 mL

## 2021-02-26 MED ORDER — HEPARIN (PORCINE) IN NACL 1000-0.9 UT/500ML-% IV SOLN
INTRAVENOUS | Status: AC
  Filled 2021-02-26: qty 1000

## 2021-02-26 MED ORDER — ONDANSETRON HCL 4 MG/2ML IJ SOLN: 4.0000 mg | Freq: Four times a day (QID) | INTRAMUSCULAR | Status: DC | PRN

## 2021-02-26 MED ORDER — LABETALOL HCL 5 MG/ML IV SOLN: 10.0000 mg | INTRAVENOUS | Status: DC | PRN

## 2021-02-26 MED ORDER — SODIUM CHLORIDE 0.9 % IV SOLN: 250.0000 mL | INTRAVENOUS | Status: DC | PRN

## 2021-02-26 MED ORDER — SODIUM CHLORIDE 0.9% FLUSH: 3.0000 mL | INTRAVENOUS | Status: DC | PRN

## 2021-02-26 MED ORDER — LIDOCAINE HCL (PF) 1 % IJ SOLN
INTRAMUSCULAR | Status: AC
  Filled 2021-02-26: qty 30

## 2021-02-26 MED ORDER — ACETAMINOPHEN 325 MG PO TABS: 650.0000 mg | ORAL_TABLET | ORAL | Status: DC | PRN

## 2021-02-26 MED ORDER — ASPIRIN 81 MG PO CHEW
81.0000 mg | CHEWABLE_TABLET | ORAL | Status: AC
  Administered 2021-02-26: 81 mg via ORAL

## 2021-02-26 SURGICAL SUPPLY — 11 items
CATH BALLN WEDGE 5F 110CM (CATHETERS) ×2 IMPLANT
GUIDEWIRE .025 260CM (WIRE) ×2 IMPLANT
KIT HEART LEFT (KITS) ×2 IMPLANT
PACK CARDIAC CATHETERIZATION (CUSTOM PROCEDURE TRAY) ×2 IMPLANT
PROTECTION STATION PRESSURIZED (MISCELLANEOUS) ×2
SHEATH GLIDE SLENDER 4/5FR (SHEATH) ×2 IMPLANT
SHEATH PROBE COVER 6X72 (BAG) ×2 IMPLANT
STATION PROTECTION PRESSURIZED (MISCELLANEOUS) ×1 IMPLANT
TRANSDUCER W/STOPCOCK (MISCELLANEOUS) ×4 IMPLANT
TUBING ART PRESS 72  MALE/FEM (TUBING) ×2
TUBING ART PRESS 72 MALE/FEM (TUBING) ×1 IMPLANT

## 2021-02-26 NOTE — Discharge Instructions (Addendum)
Brachial Site Care   This sheet gives you information about how to care for yourself after your procedure. Your health care provider may also give you more specific instructions. If you have problems or questions, contact your health care provider. What can I expect after the procedure? After the procedure, it is common to have: Bruising and tenderness at the catheter insertion area. Follow these instructions at home: Medicines Take over-the-counter and prescription medicines only as told by your health care provider. Insertion site care Follow instructions from your health care provider about how to take care of your insertion site. Make sure you: Wash your hands with soap and water before you change your bandage (dressing). If soap and water are not available, use hand sanitizer. Remove your dressing as told by your health care provider. In 24 hours Check your insertion site every day for signs of infection. Check for: Redness, swelling, or pain. Fluid or blood. Pus or a bad smell. Warmth. Do not take baths, swim, or use a hot tub until your health care provider approves. You may shower 24-48 hours after the procedure, or as directed by your health care provider. Remove the dressing and gently wash the site with plain soap and water. Pat the area dry with a clean towel. Do not rub the site. That could cause bleeding. Do not apply powder or lotion to the site. Activity   For 24 hours after the procedure, or as directed by your health care provider: Do not flex or bend the affected arm. Do not push or pull heavy objects with the affected arm. Do not drive yourself home from the hospital or clinic. You may drive 24 hours after the procedure unless your health care provider tells you not to. Do not operate machinery or power tools. Do not lift anything that is heavier than 10 lb (4.5 kg), or the limit that you are told, until your health care provider says that it is safe.  For 2  days Ask your health care provider when it is okay to: Return to work or school. Resume usual physical activities or sports. Resume sexual activity. General instructions If the catheter site starts to bleed, raise your arm and put firm pressure on the site. If the bleeding does not stop, get help right away. This is a medical emergency. If you went home on the same day as your procedure, a responsible adult should be with you for the first 24 hours after you arrive home. Keep all follow-up visits as told by your health care provider. This is important. Contact a health care provider if: You have a fever. You have redness, swelling, or yellow drainage around your insertion site. Get help right away if: You have unusual pain at the radial site. The catheter insertion area swells very fast. The insertion area is bleeding, and the bleeding does not stop when you hold steady pressure on the area. Your arm or hand becomes pale, cool, tingly, or numb. These symptoms may represent a serious problem that is an emergency. Do not wait to see if the symptoms will go away. Get medical help right away. Call your local emergency services (911 in the [place] Do not drive yourself to the hospital. Summary After the procedure, it is common to have bruising and tenderness at the site. Follow instructions from your health care provider about how to take care of your brachial site wound. Check the wound every day for signs of infection. Do not lift anything that  is heavier than 10 lb (4.5 kg), or the limit that you are told, until your health care provider says that it is safe. This information is not intended to replace advice given to you by your health care provider. Make sure you discuss any questions you have with your health care provider. Document Revised: 08/17/2017 Document Reviewed: 08/17/2017 Elsevier Patient Education  2020 [employer].

## 2021-02-26 NOTE — Interval H&P Note (Signed)
History and Physical Interval Note:  02/26/2021 1:15 PM  Austin [name]  has presented today for surgery, with the diagnosis of heart failure.  The various methods of treatment have been discussed with the patient and family. After consideration of risks, benefits and other options for treatment, the patient has consented to  Procedure(s): RIGHT HEART CATH (N/A) as a surgical intervention.  The patient's history has been reviewed, patient examined, no change in status, stable for surgery.  I have reviewed the patient's chart and labs.  Questions were answered to the patient's satisfaction.     Ethell Blatchford [employer]

## 2021-02-27 NOTE — Telephone Encounter (Signed)
RHC was done on 8/4. INR was checked on 8/3.

## 2021-03-02 ENCOUNTER — Ambulatory Visit (INDEPENDENT_AMBULATORY_CARE_PROVIDER_SITE_OTHER): Payer: Medicare Other

## 2021-03-02 DIAGNOSIS — Z95810 Presence of automatic (implantable) cardiac defibrillator: Secondary | ICD-10-CM

## 2021-03-02 DIAGNOSIS — I5022 Chronic systolic (congestive) heart failure: Secondary | ICD-10-CM | POA: Diagnosis not present

## 2021-03-03 NOTE — Progress Notes (Signed)
EPIC Encounter for ICM Monitoring  Patient Name: Austin [name] is a 80 y.o. male Date: 03/03/2021 Primary Care Physican: Austin Manes, MD Primary Cardiologist: Austin [name] Electrophysiologist: Austin [name] 03/03/2021 Weight: 190.8 lbs  AT/AF 39 Time in AT/AF 23.9 hr/day (99.4%) Longest AT/AF 3 days            Spoke with patient and heart failure questions reviewed.  Pt felt weak, tired and Austin [name] of breath yesterday but feels fine today.   Optivol Thoracic impedance suggesting normal fluid levels.     Prescribed:  Furosemide 20 mg Take 1 tablet (20 mg total) by mouth every other day. Spironolactone 25 mg take 0.5 tablet (12.5 mg total) every Mon, Wed, and Friday.   Labs: 02/26/2021 Creatinine 1.25, BUN 23, Potassium 4.0, Sodium 137, GFR 59 02/18/2021 Creatinine 1.20, BUN 20, Potassium 4.7, Sodium 134, GFR > 60 01/29/2021 Creatinine 1.30, BUN 18, Potassium 4.6, Sodium 139  12/24/2020 Creatinine 1.33, BUN 18, Potassium 4.4, Sodium 137, GFR 55 12/04/2020 Creatinine 1.26, BUN 25, Potassium 4.7, Sodium 139, GFR 58 11/05/2020 Creatinine 1.39, BUN 20, Potassium 4.5, Sodium 136, GFR 52  A complete set of results can be found in Results Review.   Recommendations:  No changes and encouraged to call if experiencing any fluid symptoms.   Follow-up plan: ICM clinic phone appointment on 04/06/2021.   91 day device clinic remote transmission 03/25/2021.      EP/Cardiology Office Visits:  03/20/2021 with Dr Austin [name].  03/11/2021 with Dr Austin [name].   Copy of ICM check sent to Dr. Lovena [name].    3 month ICM trend: 03/02/2021.    1 Year ICM trend:       Austin Billings, RN 03/03/2021 4:37 PM

## 2021-03-04 ENCOUNTER — Ambulatory Visit (INDEPENDENT_AMBULATORY_CARE_PROVIDER_SITE_OTHER): Payer: Medicare Other

## 2021-03-04 DIAGNOSIS — I4819 Other persistent atrial fibrillation: Secondary | ICD-10-CM | POA: Diagnosis not present

## 2021-03-04 DIAGNOSIS — Z5181 Encounter for therapeutic drug level monitoring: Secondary | ICD-10-CM

## 2021-03-04 LAB — POCT INR: INR: 3.5 — AB (ref 2.0–3.0)

## 2021-03-11 ENCOUNTER — Telehealth (HOSPITAL_COMMUNITY): Payer: Self-pay | Admitting: *Deleted

## 2021-03-11 ENCOUNTER — Encounter (HOSPITAL_COMMUNITY): Payer: Self-pay | Admitting: Cardiology

## 2021-03-11 ENCOUNTER — Other Ambulatory Visit: Payer: Self-pay

## 2021-03-11 ENCOUNTER — Ambulatory Visit (HOSPITAL_COMMUNITY)
Admission: RE | Admit: 2021-03-11 | Discharge: 2021-03-11 | Disposition: A | Discharge status: Home / Self Care | Payer: Medicare Other | Source: Ambulatory Visit | Attending: Cardiology | Admitting: Cardiology

## 2021-03-11 VITALS — BP 90/62 | HR 81 | Wt 195.4 lb

## 2021-03-11 DIAGNOSIS — I472 Ventricular tachycardia: Secondary | ICD-10-CM | POA: Diagnosis not present

## 2021-03-11 DIAGNOSIS — Z952 Presence of prosthetic heart valve: Secondary | ICD-10-CM | POA: Diagnosis not present

## 2021-03-11 DIAGNOSIS — Z7901 Long term (current) use of anticoagulants: Secondary | ICD-10-CM | POA: Insufficient documentation

## 2021-03-11 DIAGNOSIS — I482 Chronic atrial fibrillation, unspecified: Secondary | ICD-10-CM | POA: Insufficient documentation

## 2021-03-11 DIAGNOSIS — Z79899 Other long term (current) drug therapy: Secondary | ICD-10-CM | POA: Diagnosis not present

## 2021-03-11 DIAGNOSIS — Z8249 Family history of ischemic heart disease and other diseases of the circulatory system: Secondary | ICD-10-CM | POA: Diagnosis not present

## 2021-03-11 DIAGNOSIS — I5022 Chronic systolic (congestive) heart failure: Secondary | ICD-10-CM | POA: Diagnosis not present

## 2021-03-11 DIAGNOSIS — R5383 Other fatigue: Secondary | ICD-10-CM | POA: Insufficient documentation

## 2021-03-11 DIAGNOSIS — R42 Dizziness and giddiness: Secondary | ICD-10-CM | POA: Insufficient documentation

## 2021-03-11 DIAGNOSIS — Z950 Presence of cardiac pacemaker: Secondary | ICD-10-CM | POA: Insufficient documentation

## 2021-03-11 DIAGNOSIS — I442 Atrioventricular block, complete: Secondary | ICD-10-CM | POA: Diagnosis not present

## 2021-03-11 LAB — BASIC METABOLIC PANEL
Anion gap: 7 (ref 5–15)
BUN: 23 mg/dL (ref 8–23)
CO2: 26 mmol/L (ref 22–32)
Calcium: 9 mg/dL (ref 8.9–10.3)
Chloride: 104 mmol/L (ref 98–111)
Creatinine, Ser: 1.23 mg/dL (ref 0.61–1.24)
GFR, Estimated: >60 mL/min (ref >60)
Glucose, Bld: 96 mg/dL (ref 70–99)
Potassium: 4.4 mmol/L (ref 3.5–5.1)
Sodium: 137 mmol/L (ref 135–145)

## 2021-03-11 LAB — DIGOXIN LEVEL: Digoxin Level: 1.2 ng/mL (ref 0.8–2.0)

## 2021-03-11 MED ORDER — DIGOXIN 125 MCG PO TABS: 0.0625 mg | ORAL_TABLET | Freq: Every day | ORAL | 3 refills | Status: DC

## 2021-03-11 MED ORDER — FUROSEMIDE 20 MG PO TABS: 20.0000 mg | ORAL_TABLET | ORAL | 11 refills | Status: DC | PRN

## 2021-03-11 NOTE — Telephone Encounter (Signed)
Spoke w/pt, he is aware, agreeable, and verbalized understanding, trough dig level sch for 8/24

## 2021-03-11 NOTE — Progress Notes (Signed)
PCP: Lajean Manes, MD Cardiology: Dr. Tamala Julian HF Cardiology: Dr. Aundra Dubin EP: Dr. Lovena Le  80 y.o. with history of mechanical mitral valve s/p MV endocarditis, VT, nonischemic cardiomyopathy, complete heart block was referred by Dr. Tamala Julian for evaluation of CHF.  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 He subsequently developed a presumed nonischemic cardiomyopathy.  This was complicated by VT, he had a VT ablation in 2017 and had a Medtronic CRT-D device implanted. This had to be removed due to infection in 11/12 and was reimplanted in 1/13. Last echo in 5/22 showed EF 30% with normal RV, mechanical MV stable with mean gradient 3 mmHg.  He has been in atrial fibrillation persistently since late 2021.  He is on amiodarone and has been cardioverted in the past. His LV lead is not functional.  He has complete heart block and is persistently RV pacing.   Saw Dr Aundra Dubin in the HF clinic 02/18/21 and had mild volume overload. Started lasix 20 mg every other day. Digoxin added. RHC was set up.   Had Interior 02/26/21 RA 4, PA 32/12, PCWP 11, CO 4.6, CI 2.2. Normal filling pressures and slightly low cardiac output.   Today he returns for HF follow up.Overall feeling ok. Has had ongoing lightheadedness. No syncope. SOB with steps and inclines. Denies PND/Orthopnea. Appetite ok. No fever or chills. SBP has been 70-90s with occasional SBP > 100. Weight at home 190-191  pounds. Taking all medications.   Medtronic device interrogation: Chronic A fib. Fluid index well below threshold. Activity ~ 1 hour.   REDS clip 34%  ECG (personally reviewed): Atrial fibrillation, RV pacing, QRS 198 ms   Labs (7/22): K 4.6, creatinine 1.3  PMH: 1. 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 2. H/o VT: VT ablation in 2017.  3. Atrial fibrillation: Persistent since 2021.  Failed amiodarone.  4. PAD: Tibial occlusive disease.  5. 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   6. Hypothyroidism.  7. Chronic systolic CHF: Presumed nonischemic cardiomyopathy.  Medtronic CRT-D device, LV lead is nonfunctional.  - Echo (2012): EF 45% - Echo (1/17): EF 30-35% - Echo (6/21): EF 15-20% - Echo (11/21): EF 30-35%  - Echo (5/22): EF 30%, normal RV, mechanical mitral valve mean gradient 3 mmHg.  - RHC 02/26/21: RA mean 4, RV 33/4,PA 32/12 mean 21, PCWP mean 11, Oxygen saturations:PA 71% AO 98%Cardiac Output (Fick) 4.66 Cardiac Index (Fick) 2.18  Social History   Socioeconomic History   Marital status: Married    Spouse name: Not on file   Number of children: 1   Years of education: 18   Highest education level: Master's degree (e.g., MA, MS, MEng, MEd, MSW, MBA)  Occupational History   Occupation: retired from [employer], manufacturing systems  Tobacco Use   Smoking status: Never   Smokeless tobacco: Never  Vaping Use   Vaping Use: Never used  Substance and Sexual Activity   Alcohol use: Yes    Alcohol/week: 0.0 standard drinks    Comment: occasionally   Drug use: No   Sexual activity: Not on file  Other Topics Concern   Not on file  Social History Narrative   Lives with wife in a one story home.  Has one daughter.  Retired.  Education: Masters.   Social Determinants of Health   Financial Resource Strain: Not on file  Food Insecurity: Not on file  Transportation Needs: Not on file  Physical Activity: Not on file  Stress: Not on file  Social Connections: Not on file  Intimate Partner Violence: Not on file   Family History  Problem Relation Age of Onset   Heart disease Mother    Heart failure Mother    Heart disease Father    Heart failure Father    ROS: All systems reviewed and negative except as per HPI.   Current Outpatient Medications  Medication Sig Dispense Refill   acetaminophen (TYLENOL) 500 MG tablet Take 1,000 mg by mouth every 6 (six) hours as needed (pain).     amiodarone (PACERONE) 200 MG tablet Take 0.5 tablets (100 mg total) by mouth daily. 180  tablet 3   amoxicillin (AMOXIL) 500 MG capsule TAKE 4 CAPSULES BY MOUTH 30 MINUTES PRIOR TO DENTAL WORK 4 capsule 2   digoxin (LANOXIN) 0.125 MG tablet Take 1 tablet (0.125 mg total) by mouth daily. 90 tablet 3   furosemide (LASIX) 20 MG tablet Take 1 tablet (20 mg total) by mouth every other day. 15 tablet 11   levothyroxine (SYNTHROID) 50 MCG tablet Take 1 tablet (50 mcg total) by mouth daily before breakfast. 90 tablet 2   melatonin 5 MG TABS Take 5 mg by mouth at bedtime.     metoprolol succinate (TOPROL-XL) 25 MG 24 hr tablet Take 0.5 tablets (12.5 mg total) by mouth daily. Take with or immediately following a meal. 45 tablet 3   Multiple Vitamin (MULTIVITAMIN WITH MINERALS) TABS tablet Take 1 tablet by mouth in the morning.     nitroGLYCERIN (NITROSTAT) 0.4 MG SL tablet Place 1 tablet (0.4 mg total) under the tongue every 5 (five) minutes x 3 doses as needed for chest pain. 25 tablet 2   sacubitril-valsartan (ENTRESTO) 24-26 MG TAKE 1 TABLET BY MOUTH TWICE A DAY 60 tablet 6   warfarin (COUMADIN) 5 MG tablet TAKE AS DIRECTED BY THE COUMADIN CLINIC 90 tablet 0   No current facility-administered medications for this encounter.   BP 90/62   Pulse 81   Wt 88.6 kg (195 lb 6.4 oz)   SpO2 96%   BMI 25.09 kg/m  Wt Readings from Last 3 Encounters:  03/11/21 88.6 kg (195 lb 6.4 oz)  02/26/21 87.1 kg (192 lb)  02/18/21 90.2 kg (198 lb 12.8 oz)    General:  Well appearing. No resp difficulty HEENT: normal Neck: supple. no JVD. Carotids 2+ bilat; no bruits. No lymphadenopathy or thryomegaly appreciated. Cor: PMI nondisplaced. Regular rate & rhythm. No rubs, gallops or murmurs. Lungs: clear Abdomen: soft, nontender, nondistended. No hepatosplenomegaly. No bruits or masses. Good bowel sounds. Extremities: no cyanosis, clubbing, rash, edema Neuro: alert & orientedx3, cranial nerves grossly intact. moves all 4 extremities w/o difficulty. Affect pleasant  Assessment/Plan: 1. Chronic systolic  CHF: Long-standing, suspected nonischemic cardiomyopathy.  Last echo in 5/22 with EF 30%, normal RV, mechanical mitral valve mean gradient 3 mmHg.  He has a Medtronic CRT-D device present, but the LV lead is not functional.  He has complete heart block, so is RV paced chronically.  He has been in atrial fibrillation for about a year.  Had Leon on 02/26/21 with normal filling pressures and slightly lower cardiac output.  NYHA III. Reds Clip 34%. Volume status low. Would change lasix to as needed for 3 pound weight gain and suspect dry weight is around 193-195 pounds.  - Continue digoxin . Dig level high on 02/26/21 . Check level today. If high will change to every other day.  - Continue Entresto 24/26 bid. If SBP remains low may need  to stop entresto and switch to losartan.  - Continue Toprol XL 12.5 mg daily at bedtime.  - The patient has CHB and is chronically RV pacing as his LV lead is nonfunctional.  This is not ideal and creates dyssynchrony.   Still with ongoing fatigue. Will refer to Dr Lovena Le for placement of left bundle lead.  2. Atrial fibrillation: Persistent since 2021.   - Chronic A fib. Continue amio 100 mg daily. Dr Lovena Le does not think that the patient will hold NSR even with amiodarone use.   - Recommended against repeat DCCV as unlikely to hold NSR.  - Continue Warfarin.  3. Mechanical mitral valve: Valve looked normal on 5/22 echo.  - Continue warfarin, INR goal 3-3.5.  - He has not been on ASA.  4. VT: History of VT and VT ablation in 2017.  MDT ICD.  He is on amiodarone.  I wonder if amiodarone is causing some of his low output-type symptoms like fatigue and nausea.  - Discussed with Dr. Lovena Le, ok to continue low dose of amiodarone.  5. Complete heart block: Per notes, patient is pacer dependent due to CHB.  Currently RV pacing chronically.   Lab - Dig level 1.2. Will need to hold dig today then start 0.0625 mg daily. Repeat level in 7 days.   BMET - Renal function stable.   Amy  Clegg NP-C  03/11/2021   Patient seen with NP, agree with the above note.  RHC as above, normal filling pressures but mildly low cardiac output at 2.18.  Still with significant fatigue and orthostatic lightheadedness at times.  He does feel somewhat better compared to 3-4 weeks ago.    General: NAD Neck: No JVD, no thyromegaly or thyroid nodule.  Lungs: Clear to auscultation bilaterally with normal respiratory effort. CV: Nondisplaced PMI.  Heart regular S1/S2, no S3/S4, no murmur.  No peripheral edema.   Abdomen: Soft, nontender, no hepatosplenomegaly, no distention.  Skin: Intact without lesions or rashes.  Neurologic: Alert and oriented x 3.  Psych: Normal affect. Extremities: No clubbing or cyanosis.  HEENT: Normal.   We are limited in use of GDMT due to soft BP.  I will continue Entresto and Toprol XL at current doses for now, digoxin level is mildly high so will decrease to 0.0625 mg daily.  With chronic RV pacing and ongoing NYHA class III symptoms + low cardiac index (2.18) on RHC, I would like to see if Dr. Lovena Le will place a left bundle lead for cardiac resynchronization.  Will make appt with Dr. Lovena Le. Also agree with decreasing Lasix to prn with occasional orthostatic symptoms and not volume overloaded by exam, REDS clip, or Optivol.   Loralie Champagne 03/11/2021

## 2021-03-11 NOTE — Progress Notes (Signed)
ReDS Vest / Clip - 03/11/21 1000       ReDS Vest / Clip   Station Marker C    Ruler Value 27    ReDS Value Range Low volume    ReDS Actual Value 34

## 2021-03-11 NOTE — Telephone Encounter (Signed)
-----   Message from Larey Dresser, MD sent at 03/11/2021 12:38 PM EDT ----- Decrease digoxin to 0.0625 mg daily with repeat digoxin level in 1 week as a trough level.

## 2021-03-11 NOTE — Patient Instructions (Signed)
Take Furosemide ONLY AS NEEDED for weight gain of 3 lbs in a day or 5 lbs in a week  Labs done today, your results will be available in MyChart, we will contact you for abnormal readings.  You have been referred to follow up with Dr Lovena Le, his office will call you for a soon appointment  Your physician recommends that you schedule a follow-up appointment in: 1-2 months  If you have any questions or concerns before your next appointment please send [place] a message through [facility] or call our office at [phone].    TO LEAVE A MESSAGE FOR THE NURSE SELECT OPTION 2, PLEASE LEAVE A MESSAGE INCLUDING: YOUR NAME DATE OF BIRTH CALL BACK NUMBER REASON FOR CALL**this is important as we prioritize the call backs  YOU WILL RECEIVE A CALL BACK THE SAME DAY AS LONG AS YOU CALL BEFORE 4:00 PM  At the [facility], you and your health needs are our priority. As part of our continuing mission to provide you with exceptional heart care, we have created designated Provider Care Teams. These Care Teams include your primary Cardiologist (physician) and Advanced Practice Providers (APPs- Physician Assistants and Nurse Practitioners) who all work together to provide you with the care you need, when you need it.   You may see any of the following providers on your designated Care Team at your next follow up: Dr Glori Bickers Dr Loralie Champagne Dr Patrice Paradise, NP Lyda Jester, [place] Ginnie Smart Audry Riles, PharmD   Please be sure to bring in all your medications bottles to every appointment.

## 2021-03-18 ENCOUNTER — Ambulatory Visit (HOSPITAL_COMMUNITY)
Admission: RE | Admit: 2021-03-18 | Discharge: 2021-03-18 | Disposition: A | Discharge status: Home / Self Care | Payer: Medicare Other | Source: Ambulatory Visit | Attending: Cardiology | Admitting: Cardiology

## 2021-03-18 ENCOUNTER — Other Ambulatory Visit: Payer: Self-pay

## 2021-03-18 DIAGNOSIS — I5022 Chronic systolic (congestive) heart failure: Secondary | ICD-10-CM | POA: Insufficient documentation

## 2021-03-18 LAB — DIGOXIN LEVEL: Digoxin Level: 2 ng/mL (ref 0.8–2.0)

## 2021-03-19 ENCOUNTER — Telehealth (HOSPITAL_COMMUNITY): Payer: Self-pay

## 2021-03-19 ENCOUNTER — Ambulatory Visit (INDEPENDENT_AMBULATORY_CARE_PROVIDER_SITE_OTHER): Payer: Medicare Other | Admitting: *Deleted

## 2021-03-19 ENCOUNTER — Telehealth: Payer: Self-pay

## 2021-03-19 DIAGNOSIS — Z5181 Encounter for therapeutic drug level monitoring: Secondary | ICD-10-CM

## 2021-03-19 DIAGNOSIS — I4819 Other persistent atrial fibrillation: Secondary | ICD-10-CM

## 2021-03-19 LAB — POCT INR: INR: 3.1 — AB (ref 2.0–3.0)

## 2021-03-19 NOTE — Telephone Encounter (Signed)
Patient advised and verbalized understanding. Med list updated to reflect changes.  ? ?

## 2021-03-19 NOTE — Telephone Encounter (Signed)
-----   Message from Larey Dresser, MD sent at 03/18/2021  4:46 PM EDT ----- Digoxin level is too high.  Confirm he has been taking 0.0625 mg daily, but he will need to stop it for now regardless.

## 2021-03-20 ENCOUNTER — Ambulatory Visit: Payer: Medicare Other | Admitting: Interventional Cardiology

## 2021-03-23 ENCOUNTER — Encounter (HOSPITAL_COMMUNITY): Payer: Self-pay

## 2021-03-25 ENCOUNTER — Ambulatory Visit (INDEPENDENT_AMBULATORY_CARE_PROVIDER_SITE_OTHER): Payer: Medicare Other

## 2021-03-25 DIAGNOSIS — I428 Other cardiomyopathies: Secondary | ICD-10-CM

## 2021-03-31 ENCOUNTER — Other Ambulatory Visit: Payer: Self-pay | Admitting: Interventional Cardiology

## 2021-03-31 LAB — CUP PACEART REMOTE DEVICE CHECK
Battery Remaining Longevity: 46 mo
Battery Voltage: 2.96 V
Brady Statistic AP VP Percent: 2.41 %
Brady Statistic AP VS Percent: 0.11 %
Brady Statistic AS VP Percent: 83.84 %
Brady Statistic AS VS Percent: 13.63 %
Brady Statistic RA Percent Paced: 2.4 %
Brady Statistic RV Percent Paced: 85.51 %
Date Time Interrogation Session: 20220831022604
HighPow Impedance: 71 Ohm
Implantable Lead Implant Date: 20130110
Implantable Lead Implant Date: 20130110
Implantable Lead Location: 753859
Implantable Lead Location: 753860
Implantable Lead Model: 5076
Implantable Lead Model: 6935
Implantable Pulse Generator Implant Date: 20181010
Lead Channel Impedance Value: 304 Ohm
Lead Channel Impedance Value: 399 Ohm
Lead Channel Impedance Value: 399 Ohm
Lead Channel Pacing Threshold Amplitude: 1 V
Lead Channel Pacing Threshold Pulse Width: 0.4 ms
Lead Channel Sensing Intrinsic Amplitude: 0.5 mV
Lead Channel Sensing Intrinsic Amplitude: 0.5 mV
Lead Channel Sensing Intrinsic Amplitude: 5.25 mV
Lead Channel Sensing Intrinsic Amplitude: 5.25 mV
Lead Channel Setting Pacing Amplitude: 2.5 V
Lead Channel Setting Pacing Amplitude: 3.5 V
Lead Channel Setting Pacing Pulse Width: 0.4 ms
Lead Channel Setting Sensing Sensitivity: 0.3 mV

## 2021-04-02 ENCOUNTER — Ambulatory Visit (INDEPENDENT_AMBULATORY_CARE_PROVIDER_SITE_OTHER): Payer: Medicare Other | Admitting: *Deleted

## 2021-04-02 DIAGNOSIS — I4819 Other persistent atrial fibrillation: Secondary | ICD-10-CM | POA: Diagnosis not present

## 2021-04-02 DIAGNOSIS — Z952 Presence of prosthetic heart valve: Secondary | ICD-10-CM | POA: Diagnosis not present

## 2021-04-02 DIAGNOSIS — Z5181 Encounter for therapeutic drug level monitoring: Secondary | ICD-10-CM

## 2021-04-02 LAB — POCT INR: INR: 3.3 — AB (ref 2.0–3.0)

## 2021-04-06 ENCOUNTER — Ambulatory Visit (INDEPENDENT_AMBULATORY_CARE_PROVIDER_SITE_OTHER): Payer: Medicare Other

## 2021-04-06 DIAGNOSIS — I5022 Chronic systolic (congestive) heart failure: Secondary | ICD-10-CM

## 2021-04-06 DIAGNOSIS — Z95810 Presence of automatic (implantable) cardiac defibrillator: Secondary | ICD-10-CM | POA: Diagnosis not present

## 2021-04-06 NOTE — Progress Notes (Addendum)
EPIC Encounter for ICM Monitoring  Patient Name: Austin [name] is a 80 y.o. male Date: 04/06/2021 Primary Care Physican: Lajean Manes, MD Primary Cardiologist: [place] Electrophysiologist: Lovena Le 04/06/2021 Weight: 192 lbs   Time in AT/AF 24.0 hr/day (100.0%) Longest AT/AF 11 days            Spoke with patient and heart failure questions reviewed.  Pt is feeling better in the last month.  He is taking Furosemide PRN and has taken it about 3 times in the last few weeks.  Advised to monitor closely for fluid symptoms since Furosemide is now PRN instead of every other day.     Optivol Thoracic impedance suggesting possible fluid accumulation starting 03/16/2021 but trending back close to baseine.    Prescribed:  Furosemide 20 mg Take 1 tablet (20 mg total) by mouth as needed (for 3 lbs weight gain in 24 hours).   Labs: 03/11/2021 Creatinine 1.23, BUN 23, Potassium 4.4, Sodium 137, GFR >60 02/26/2021 Creatinine 1.25, BUN 23, Potassium 4.0, Sodium 137, GFR 59 02/18/2021 Creatinine 1.20, BUN 20, Potassium 4.7, Sodium 134, GFR > 60 01/29/2021 Creatinine 1.30, BUN 18, Potassium 4.6, Sodium 139  12/24/2020 Creatinine 1.33, BUN 18, Potassium 4.4, Sodium 137, GFR 55 12/04/2020 Creatinine 1.26, BUN 25, Potassium 4.7, Sodium 139, GFR 58 11/05/2020 Creatinine 1.39, BUN 20, Potassium 4.5, Sodium 136, GFR 52  A complete set of results can be found in Results Review.   Recommendations:  No changes and encouraged to call if experiencing any fluid symptoms.   Follow-up plan: ICM clinic phone appointment on 05/11/2021.   91 day device clinic remote transmission 06/24/2021.      EP/Cardiology Office Visits:  04/17/2021 with Dr Lovena Le.   05/11/2021 with Dr Aundra Dubin.   Copy of ICM check sent to Dr. Lovena Le.     3 month ICM trend: 03/27/2021.    1 Year ICM trend:       [name], RN 04/06/2021 4:53 PM

## 2021-04-07 NOTE — Progress Notes (Signed)
Remote ICD transmission.   

## 2021-04-15 ENCOUNTER — Encounter (HOSPITAL_COMMUNITY): Payer: Medicare Other | Admitting: Cardiology

## 2021-04-16 ENCOUNTER — Ambulatory Visit (INDEPENDENT_AMBULATORY_CARE_PROVIDER_SITE_OTHER): Payer: Medicare Other | Admitting: *Deleted

## 2021-04-16 DIAGNOSIS — Z5181 Encounter for therapeutic drug level monitoring: Secondary | ICD-10-CM

## 2021-04-16 DIAGNOSIS — I4819 Other persistent atrial fibrillation: Secondary | ICD-10-CM | POA: Diagnosis not present

## 2021-04-16 LAB — POCT INR: INR: 2.8 (ref 2.0–3.0)

## 2021-04-17 ENCOUNTER — Ambulatory Visit: Payer: Medicare Other | Admitting: Internal Medicine

## 2021-04-17 ENCOUNTER — Other Ambulatory Visit: Payer: Self-pay

## 2021-04-17 VITALS — BP 94/60 | HR 127 | Ht 74.0 in | Wt 194.6 lb

## 2021-04-17 DIAGNOSIS — I472 Ventricular tachycardia, unspecified: Secondary | ICD-10-CM

## 2021-04-17 DIAGNOSIS — Z95810 Presence of automatic (implantable) cardiac defibrillator: Secondary | ICD-10-CM | POA: Diagnosis not present

## 2021-04-17 DIAGNOSIS — I5022 Chronic systolic (congestive) heart failure: Secondary | ICD-10-CM

## 2021-04-17 DIAGNOSIS — I4819 Other persistent atrial fibrillation: Secondary | ICD-10-CM | POA: Diagnosis not present

## 2021-04-17 MED ORDER — AMIODARONE HCL 200 MG PO TABS: ORAL_TABLET | ORAL | 3 refills | Status: DC

## 2021-04-17 NOTE — Patient Instructions (Addendum)
Medication Instructions:  Your physician has recommended you make the following change in your medication:    INCREASE your amiodarone 200 mg-  Take one tablet by mouth daily EXCEPT take TWO tablets by mouth Saturday and Sunday.   Labwork: None ordered.  Testing/Procedures: None ordered.  Follow-Up: Your physician wants you to follow-up in:  May 22, 2021 at 2:15 pm with Dr. Lovena Le  Remote monitoring is used to monitor your ICD from home. This monitoring reduces the number of office visits required to check your device to one time per year. It allows [place] to keep an eye on the functioning of your device to ensure it is working properly. You are scheduled for a device check from home on 05/11/2021. You may send your transmission at any time that day. If you have a wireless device, the transmission will be sent automatically. After your physician reviews your transmission, you will receive a postcard with your next transmission date.  Any Other Special Instructions Will Be Listed Below (If Applicable).  If you need a refill on your cardiac medications before your next appointment, please call your pharmacy.

## 2021-04-17 NOTE — Progress Notes (Signed)
HPI Austin [name] returns today for followup of VT, atrial fib, CHB, chronic systolic heart failure and ICD followup. He was seen by Dr. Aundra Dubin and is referred back today to discuss left bundle area pacing. He has felt poorly the last day. He stated he considered going to the ED. His dose of amio was decreased to 100 mg daily. No Known Allergies   Current Outpatient Medications  Medication Sig Dispense Refill   acetaminophen (TYLENOL) 500 MG tablet Take 1,000 mg by mouth every 6 (six) hours as needed (pain).     amiodarone (PACERONE) 200 MG tablet Take 0.5 tablets (100 mg total) by mouth daily. 180 tablet 3   amoxicillin (AMOXIL) 500 MG capsule TAKE 4 CAPSULES BY MOUTH 30 MINUTES PRIOR TO DENTAL WORK 4 capsule 2   furosemide (LASIX) 20 MG tablet Take 1 tablet (20 mg total) by mouth as needed (for 3 lbs weight gain in 24 hours). 15 tablet 11   levothyroxine (SYNTHROID) 50 MCG tablet Take 1 tablet (50 mcg total) by mouth daily before breakfast. 90 tablet 2   melatonin 5 MG TABS Take 5 mg by mouth at bedtime.     metoprolol succinate (TOPROL-XL) 25 MG 24 hr tablet Take 0.5 tablets (12.5 mg total) by mouth daily. Take with or immediately following a meal. (Patient taking differently: Take 12.5 mg by mouth at bedtime. Take with or immediately following a meal.) 45 tablet 3   Multiple Vitamin (MULTIVITAMIN WITH MINERALS) TABS tablet Take 1 tablet by mouth in the morning.     nitroGLYCERIN (NITROSTAT) 0.4 MG SL tablet Place 1 tablet (0.4 mg total) under the tongue every 5 (five) minutes x 3 doses as needed for chest pain. 25 tablet 2   sacubitril-valsartan (ENTRESTO) 24-26 MG TAKE 1 TABLET BY MOUTH TWICE A DAY 60 tablet 6   warfarin (COUMADIN) 5 MG tablet TAKE AS DIRECTED BY THE COUMADIN CLINIC 80 tablet 1   No current facility-administered medications for this visit.     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   Past Surgical History:  Procedure Laterality Date   BIV ICD GENERATOR CHANGEOUT N/A 05/04/2017   Procedure: BiV ICD Generator Changeout;  Surgeon: Evans Lance, MD;  Location: [facility];  Service: Cardiovascular;  Laterality: N/A;   CARDIAC CATHETERIZATION  04/24/2002   CARDIAC VALVE REPLACEMENT     CARDIOVERSION  03/09/2012   Procedure: CARDIOVERSION;  Surgeon: Evans Lance, MD;  Location: [facility];  Service: Cardiovascular;  Laterality: N/A;   CARDIOVERSION N/A 11/22/2013   Procedure: CARDIOVERSION;  Surgeon: Sanda Klein, MD;  Location: [facility];  Service: Cardiovascular;  Laterality: N/A;   dental implants     ELECTROPHYSIOLOGIC STUDY N/A 09/15/2015  Procedure: V Tach Ablation;  Surgeon: Evans Lance, MD;  Location: [facility];  Service: Cardiovascular;  Laterality: N/A;   Evacution of epidural lumbar epidural abscess  1999   INSERT / REPLACE / REMOVE PACEMAKER  11/2008   MITRAL VALVE REPLACEMENT     w #[id number] [facility]   PERMANENT PACEMAKER INSERTION N/A 08/05/2011   Procedure: PERMANENT PACEMAKER INSERTION;  Surgeon: Evans Lance, MD;  Location: [facility];  Service: Cardiovascular;  Laterality: N/A;   RIGHT HEART CATH N/A 02/26/2021   Procedure: RIGHT HEART CATH;  Surgeon: Larey Dresser, MD;  Location: [facility];  Service: Cardiovascular;   Laterality: N/A;   THYROIDECTOMY     TONSILLECTOMY     VALVE REPLACEMENT  2000     Family History  Problem Relation Age of Onset   Heart disease Mother    Heart failure Mother    Heart disease Father    Heart failure Father      Social History   Socioeconomic History   Marital status: Married    Spouse name: Not on file   Number of children: 1   Years of education: 25   Highest education level: Master's degree (e.g., MA, MS, MEng, MEd, MSW, MBA)  Occupational History   Occupation: retired from [employer], manufacturing systems  Tobacco Use   Smoking status: Never   Smokeless tobacco: Never  Vaping Use   Vaping Use: Never used  Substance and Sexual Activity   Alcohol use: Yes    Alcohol/week: 0.0 standard drinks    Comment: occasionally   Drug use: No   Sexual activity: Not on file  Other Topics Concern   Not on file  Social History Narrative   Lives with wife in a one story home.  Has one daughter.  Retired.  Education: Masters.   Social Determinants of Health   Financial Resource Strain: Not on file  Food Insecurity: Not on file  Transportation Needs: Not on file  Physical Activity: Not on file  Stress: Not on file  Social Connections: Not on file  Intimate Partner Violence: Not on file     There were no vitals taken for this visit.  Physical Exam:  Diskempt and ill appearing man, NAD HEENT: Unremarkable Neck:  6 cm JVD, no thyromegally Lymphatics:  No adenopathy Back:  No CVA tenderness Lungs:  Clear with rales in the bases HEART:  Regular tachy rhythm, no murmurs, no rubs, no clicks Abd:  soft, positive bowel sounds, no organomegally, no rebound, no guarding Ext:  2 plus pulses, no edema, no cyanosis, no clubbing Skin:  No rashes no nodules Neuro:  CN II through XII intact, motor grossly intact  EKG - VT at 120/min.   DEVICE  Normal device function.  See PaceArt for details.   Assess/Plan:  1. Atrial fib/flutter - his rates are well controlled. He has  left atrial flutter. I tride to pace him back to rhythm but his atrial flutter was replaced by atrial fib. 2. 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 I do not appreciate MR or MS on exam.  3. 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 After pacing him out of VT he is mostly pacing today but does have some conduction.  4. VT - he was back in VT in the office. I pace him out of his VT and back to atrial fib  with intermittent pacing and sensing. He will increase the amio to 200 mg daily and bid on sat/sun.  5. Chronic systolic heart failure -the results of his heart cath were reviewed. He has a low output state despite maximal medical therapy. I outlined the possibility of placing a left bundle area lead but also discussed the drawbacks including the risk of infection which could be catastrophic in that his left side is not able to be utilized, at least not the left subclavian vein. In light of the risks of another big surgery, likely requiring device extraction, we will undergo watchful waiting.    Austin Overlie Shelita Steptoe,MD

## 2021-04-21 NOTE — Telephone Encounter (Signed)
Overdue - called patient

## 2021-04-30 ENCOUNTER — Ambulatory Visit (INDEPENDENT_AMBULATORY_CARE_PROVIDER_SITE_OTHER): Payer: Medicare Other | Admitting: *Deleted

## 2021-04-30 DIAGNOSIS — I4819 Other persistent atrial fibrillation: Secondary | ICD-10-CM | POA: Diagnosis not present

## 2021-04-30 DIAGNOSIS — Z5181 Encounter for therapeutic drug level monitoring: Secondary | ICD-10-CM | POA: Diagnosis not present

## 2021-04-30 LAB — POCT INR: INR: 3.5 — AB (ref 2.0–3.0)

## 2021-04-30 NOTE — Patient Instructions (Signed)
Description   Spoke with pt and advised him to continue taking 1/2 tablet daily except 1 tablet on Mondays and Fridays. AMIO DOSE INCREASED TO 400MG  ON SAT & SUN ALL OTHER DAYS STAY AT 200MG . Recheck INR in 2 weeks. Pt's cell phone [phone] in case difficulty reaching pt on home phone.

## 2021-05-11 ENCOUNTER — Other Ambulatory Visit (HOSPITAL_COMMUNITY): Payer: Self-pay

## 2021-05-11 ENCOUNTER — Ambulatory Visit (HOSPITAL_COMMUNITY)
Admission: RE | Admit: 2021-05-11 | Discharge: 2021-05-11 | Disposition: A | Discharge status: Home / Self Care | Payer: Medicare Other | Source: Ambulatory Visit | Attending: Cardiology | Admitting: Cardiology

## 2021-05-11 ENCOUNTER — Ambulatory Visit (INDEPENDENT_AMBULATORY_CARE_PROVIDER_SITE_OTHER): Payer: Medicare Other

## 2021-05-11 ENCOUNTER — Encounter (HOSPITAL_COMMUNITY): Payer: Self-pay | Admitting: Cardiology

## 2021-05-11 ENCOUNTER — Other Ambulatory Visit: Payer: Self-pay

## 2021-05-11 ENCOUNTER — Other Ambulatory Visit (HOSPITAL_COMMUNITY): Payer: Self-pay | Admitting: Cardiology

## 2021-05-11 VITALS — BP 80/50 | HR 71 | Wt 196.2 lb

## 2021-05-11 DIAGNOSIS — I442 Atrioventricular block, complete: Secondary | ICD-10-CM | POA: Diagnosis not present

## 2021-05-11 DIAGNOSIS — Z95810 Presence of automatic (implantable) cardiac defibrillator: Secondary | ICD-10-CM

## 2021-05-11 DIAGNOSIS — E039 Hypothyroidism, unspecified: Secondary | ICD-10-CM | POA: Diagnosis not present

## 2021-05-11 DIAGNOSIS — Z79899 Other long term (current) drug therapy: Secondary | ICD-10-CM | POA: Diagnosis not present

## 2021-05-11 DIAGNOSIS — Z8249 Family history of ischemic heart disease and other diseases of the circulatory system: Secondary | ICD-10-CM | POA: Insufficient documentation

## 2021-05-11 DIAGNOSIS — Z79890 Hormone replacement therapy (postmenopausal): Secondary | ICD-10-CM | POA: Diagnosis not present

## 2021-05-11 DIAGNOSIS — Z952 Presence of prosthetic heart valve: Secondary | ICD-10-CM | POA: Diagnosis not present

## 2021-05-11 DIAGNOSIS — Z7901 Long term (current) use of anticoagulants: Secondary | ICD-10-CM | POA: Insufficient documentation

## 2021-05-11 DIAGNOSIS — I5022 Chronic systolic (congestive) heart failure: Secondary | ICD-10-CM

## 2021-05-11 DIAGNOSIS — Z950 Presence of cardiac pacemaker: Secondary | ICD-10-CM | POA: Diagnosis not present

## 2021-05-11 DIAGNOSIS — R55 Syncope and collapse: Secondary | ICD-10-CM | POA: Diagnosis not present

## 2021-05-11 DIAGNOSIS — I4819 Other persistent atrial fibrillation: Secondary | ICD-10-CM | POA: Insufficient documentation

## 2021-05-11 DIAGNOSIS — I472 Ventricular tachycardia, unspecified: Secondary | ICD-10-CM | POA: Diagnosis not present

## 2021-05-11 LAB — COMPREHENSIVE METABOLIC PANEL
ALT: 23 U/L (ref 0–44)
AST: 29 U/L (ref 15–41)
Albumin: 3.7 g/dL (ref 3.5–5.0)
Alkaline Phosphatase: 68 U/L (ref 38–126)
Anion gap: 7 (ref 5–15)
BUN: 19 mg/dL (ref 8–23)
CO2: 23 mmol/L (ref 22–32)
Calcium: 8.8 mg/dL — ABNORMAL LOW (ref 8.9–10.3)
Chloride: 107 mmol/L (ref 98–111)
Creatinine, Ser: 1.22 mg/dL (ref 0.61–1.24)
GFR, Estimated: >60 mL/min (ref >60)
Glucose, Bld: 99 mg/dL (ref 70–99)
Potassium: 4.2 mmol/L (ref 3.5–5.1)
Sodium: 137 mmol/L (ref 135–145)
Total Bilirubin: 1.3 mg/dL — ABNORMAL HIGH (ref 0.3–1.2)
Total Protein: 6.8 g/dL (ref 6.5–8.1)

## 2021-05-11 LAB — BRAIN NATRIURETIC PEPTIDE: B Natriuretic Peptide: 611.5 pg/mL — ABNORMAL HIGH (ref 0.0–100.0)

## 2021-05-11 MED ORDER — VALSARTAN 40 MG PO TABS: 20.0000 mg | ORAL_TABLET | Freq: Two times a day (BID) | ORAL | 11 refills | Status: DC

## 2021-05-11 MED ORDER — VERQUVO 2.5 MG PO TABS: 1.0000 | ORAL_TABLET | Freq: Every day | ORAL | 11 refills | Status: DC

## 2021-05-11 MED ORDER — FUROSEMIDE 20 MG PO TABS: 20.0000 mg | ORAL_TABLET | Freq: Every day | ORAL | 11 refills | Status: DC

## 2021-05-11 NOTE — Patient Instructions (Addendum)
EKG done today.  Labs done today. We will contact you only if your labs are abnormal.  STOP taking Entresto  INCREASE Lasix to 20mg  (1 tablet) by mouth daily.  START Valsartan 20mg  (1/2 tablet) by mouth 2 times daily.   START Verquvo 2.5mg  (1 tablet) by mouth daily.   No other medication changes were made. Please continue all current medications as prescribed.  Your physician recommends that you schedule a follow-up appointment in: 10 days for a lab only appointment and in 1 month for an appointment with Dr. Aundra Dubin.  If you have any questions or concerns before your next appointment please send [place] a message through [place] or call our office at [phone].    TO LEAVE A MESSAGE FOR THE NURSE SELECT OPTION 2, PLEASE LEAVE A MESSAGE INCLUDING: YOUR NAME DATE OF BIRTH CALL BACK NUMBER REASON FOR CALL**this is important as we prioritize the call backs  YOU WILL RECEIVE A CALL BACK THE SAME DAY AS LONG AS YOU CALL BEFORE 4:00 PM   Do the following things EVERYDAY: Weigh yourself in the morning before breakfast. Write it down and keep it in a log. Take your medicines as prescribed Eat low salt foods--Limit salt (sodium) to 2000 mg per day.  Stay as active as you can everyday Limit all fluids for the day to less than 2 liters   At the [facility], you and your health needs are our priority. As part of our continuing mission to provide you with exceptional heart care, we have created designated Provider Care Teams. These Care Teams include your primary Cardiologist (physician) and Advanced Practice Providers (APPs- Physician Assistants and Nurse Practitioners) who all work together to provide you with the care you need, when you need it.   You may see any of the following providers on your designated Care Team at your next follow up: Dr Glori Bickers Dr Haynes Kerns, NP Lyda Jester, [place] Audry Riles, PharmD   Please be sure to bring in all  your medications bottles to every appointment.

## 2021-05-11 NOTE — Progress Notes (Signed)
PCP: Lajean Manes, MD Cardiology: Dr. Tamala Julian HF Cardiology: Dr. Aundra Dubin EP: Dr. Lovena Le  80 y.o. with history of mechanical mitral valve s/p MV endocarditis, VT, nonischemic cardiomyopathy, complete heart block was referred by Dr. Tamala Julian for evaluation of CHF.  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 He subsequently developed a presumed nonischemic cardiomyopathy.  This was complicated by VT, he had a VT ablation in 2017 and had a Medtronic CRT-D device implanted. This had to be removed due to infection in 11/12 and was reimplanted in 1/13. Last echo in 5/22 showed EF 30% with normal RV, mechanical MV stable with mean gradient 3 mmHg.  He has been in atrial fibrillation persistently since late 2021.  He is on amiodarone and has been cardioverted in the past. His LV lead is not functional.  He has complete heart block and is persistently RV pacing.   Had [place] 02/26/21 RA 4, PA 32/12, PCWP 11, CO 4.6, CI 2.2. Normal filling pressures and slightly low cardiac output.   Digoxin was stopped due to elevated level.   He saw Dr. Lovena Le in 9/22 and was in slow VT in the office.  He was paced out of VT and amiodarone was increased to 200 mg daily.   Today he returns for HF followup.  He feels drained/tired all the time.  Generally exhausted with most activity. He is short of breath showering, shaving, walking more than about 50-100 feet.  He thinks that he felt better when CRT was functional.  BP is low today at 80/50, he does get lightheaded with standing but no falls.   Medtronic device interrogation: Chronic A fib. Fluid index > threshold with low thoracic impedance.  Chronic RV pacing.   ECG (personally reviewed): Atypical atrial flutter, RV pacing  Labs (7/22): K 4.6, creatinine 1.3 Labs (8/22): K 4.4, creatinine 1.23  PMH: 1. 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 2. H/o VT: VT ablation in 2017.  3. Atrial  fibrillation/atypical flutter: Persistent since 2021.  Failed amiodarone.  4. PAD: Tibial occlusive disease.  5. 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  6. Hypothyroidism.  7. Chronic systolic CHF: Presumed nonischemic cardiomyopathy.  Medtronic CRT-D device, LV lead is nonfunctional.  - Echo (2012): EF 45% - Echo (1/17): EF 30-35% - Echo (6/21): EF 15-20% - Echo (11/21): EF 30-35%  - Echo (5/22): EF 30%, normal RV, mechanical mitral valve mean gradient 3 mmHg.  - RHC (8/22): RA mean 4, RV 33/4,PA 32/12 mean 21, PCWP mean 11, Oxygen saturations:PA 71% AO 98%, Cardiac Output (Fick) 4.66 Cardiac Index (Fick) 2.18  Social History   Socioeconomic History   Marital status: Married    Spouse name: Not on file   Number of children: 1   Years of education: 18   Highest education level: Master's degree (e.g., MA, MS, MEng, MEd, MSW, MBA)  Occupational History   Occupation: retired from [employer], manufacturing systems  Tobacco Use   Smoking status: Never   Smokeless tobacco: Never  Vaping Use   Vaping Use: Never used  Substance and Sexual Activity   Alcohol use: Yes    Alcohol/week: 0.0 standard drinks    Comment: occasionally   Drug use: No   Sexual activity: Not on file  Other Topics Concern   Not on file  Social History Narrative   Lives with wife in a one story home.  Has one daughter.  Retired.  Education: Masters.   Social Determinants of Health  Financial Resource Strain: Not on file  Food Insecurity: Not on file  Transportation Needs: Not on file  Physical Activity: Not on file  Stress: Not on file  Social Connections: Not on file  Intimate Partner Violence: Not on file   Family History  Problem Relation Age of Onset   Heart disease Mother    Heart failure Mother    Heart disease Father    Heart failure Father    ROS: All systems reviewed and negative except as per HPI.   Current Outpatient Medications  Medication Sig Dispense Refill   acetaminophen (TYLENOL) 500 MG tablet Take  1,000 mg by mouth every 6 (six) hours as needed (pain).     amiodarone (PACERONE) 200 MG tablet Take one tablet by mouth Monday through Friday.  Take TWO tablets by mouth Saturday and Sunday. 108 tablet 3   amoxicillin (AMOXIL) 500 MG capsule TAKE 4 CAPSULES BY MOUTH 30 MINUTES PRIOR TO DENTAL WORK 4 capsule 2   furosemide (LASIX) 20 MG tablet Take 1 tablet (20 mg total) by mouth daily. 30 tablet 11   levothyroxine (SYNTHROID) 50 MCG tablet Take 1 tablet (50 mcg total) by mouth daily before breakfast. 90 tablet 2   melatonin 5 MG TABS Take 5 mg by mouth at bedtime.     metoprolol succinate (TOPROL-XL) 25 MG 24 hr tablet Take 0.5 tablets (12.5 mg total) by mouth daily. Take with or immediately following a meal. 45 tablet 3   Multiple Vitamin (MULTIVITAMIN WITH MINERALS) TABS tablet Take 1 tablet by mouth in the morning.     nitroGLYCERIN (NITROSTAT) 0.4 MG SL tablet Place 1 tablet (0.4 mg total) under the tongue every 5 (five) minutes x 3 doses as needed for chest pain. 25 tablet 2   valsartan (DIOVAN) 40 MG tablet Take 0.5 tablets (20 mg total) by mouth 2 (two) times daily. 30 tablet 11   Vericiguat (VERQUVO) 2.5 MG TABS Take 1 tablet by mouth daily. 30 tablet 11   warfarin (COUMADIN) 5 MG tablet TAKE AS DIRECTED BY THE COUMADIN CLINIC 80 tablet 1   No current facility-administered medications for this encounter.   BP (!) 80/50   Pulse 71   Wt 89 kg (196 lb 3.2 oz)   SpO2 95%   BMI 25.19 kg/m  Wt Readings from Last 3 Encounters:  05/11/21 89 kg (196 lb 3.2 oz)  04/17/21 88.3 kg (194 lb 9.6 oz)  03/11/21 88.6 kg (195 lb 6.4 oz)   General: NAD Neck: JVP 8-9 cm, no thyromegaly or thyroid nodule.  Lungs: Clear to auscultation bilaterally with normal respiratory effort. CV: Nondisplaced PMI.  Heart regular S1/S2 with mechanical S1, no S3/S4, 1/6 SEM RUSB.  No peripheral edema.  No carotid bruit.  Normal pedal pulses.  Abdomen: Soft, nontender, no hepatosplenomegaly, no distention.  Skin:  Intact without lesions or rashes.  Neurologic: Alert and oriented x 3.  Psych: Normal affect. Extremities: No clubbing or cyanosis.  HEENT: Normal.   Assessment/Plan: 1. Chronic systolic CHF: Long-standing, suspected nonischemic cardiomyopathy.  Last echo in 5/22 with EF 30%, normal RV, mechanical mitral valve mean gradient 3 mmHg.  He has a Medtronic CRT-D device present, but the LV lead is not functional.  He has complete heart block, so is RV paced chronically.  He has been in atrial fibrillation for about a year.  Had [place] on 02/26/21 with normal filling pressures and mildly low cardiac output.  NYHA III, feels "terrible". He is volume overloaded on exam  and by Optivol.  BP running low with orthostatic symptoms.  - Start on Lasix 20 mg daily with BMET today and in 10 days.  - Off digoxin due to persistently elevated level.  - With orthostatic symptoms and low BP, I will have him stop Entresto and start valsartan 20 mg bid.   - Continue Toprol XL 12.5 mg daily at bedtime.  - Limited ability to use GDMT due to low BP.  I will have him start on Verquvo 2.5 mg daily with persistent symptoms and low BP.  This should not have much effect on BP.  - The patient has CHB and is chronically RV pacing as his LV lead is nonfunctional.  This is not ideal and creates dyssynchrony.  Remains markedly symptomatic with limited ability to titrate meds. He has seen Dr. Lovena Le, it would be possible to place left bundle lead but procedure would be complicated.  Patient understands the risks and wants to have attempt to place left bundle lead.  He has followup with Dr. Lovena Le to discuss this.  2. Atrial fibrillation: Persistent since 2021.  Recommend against repeat DCCV as unlikely to hold NSR.  - Continue Warfarin.  3. Mechanical mitral valve: Valve looked normal on 5/22 echo.  - Continue warfarin, INR goal 3-3.5.  - He has not been on ASA.  4. VT: History of VT and VT ablation in 2017.  MDT ICD.  He is on amiodarone,  this was increased to 200 mg daily recently with recurrent VT.  I wonder if amiodarone is causing some of his low output-type symptoms like fatigue and nausea.  - Continue current amiodarone, check LFTs today.  He is on Levoxyl for hypothyroidism.  He will need a regular eye exam.  5. Complete heart block: Per notes, patient is pacer dependent due to CHB.  Currently RV pacing chronically.  - See above, patient is interested in attempt to upgrade to CRT by left bundle pacing.   Followup in 1 month.   Loralie Champagne 05/11/2021

## 2021-05-12 ENCOUNTER — Other Ambulatory Visit (HOSPITAL_COMMUNITY): Payer: Self-pay

## 2021-05-12 ENCOUNTER — Telehealth (HOSPITAL_COMMUNITY): Payer: Self-pay | Admitting: Pharmacy Technician

## 2021-05-12 NOTE — Telephone Encounter (Signed)
Patient Advocate Encounter   Received notification from [facility] that prior authorization for Austin [name] is required.   PA submitted on CoverMyMeds Key [id number]  Status is pending   Will continue to follow.

## 2021-05-12 NOTE — Progress Notes (Signed)
EPIC Encounter for ICM Monitoring  Patient Name: Austin [name] is a 80 y.o. male Date: 05/12/2021 Primary Care Physican: Lajean Manes, MD Primary Cardiologist: [place] Electrophysiologist: Lovena Le 04/06/2021 Weight: 192 lbs  Time in AT/AF 24.0 hr/day (99.9%) Longest AT/AF 8 days            Pt seen in HF clinic by Dr Aundra Dubin on 10/17. Per OV note review, recommendations were given to change Lasix to 20 mg daily instead of PRN due to patient was overloaded by exam and Optivol.   Optivol Thoracic impedance suggesting possible fluid accumulation starting 04/09/2021.    Prescribed:  Furosemide 20 mg Take 1 tablet (20 mg total) by mouth daily.   Labs: 05/11/2021 Creatinine 1.22, BUN 19, Potassium 4.2, Sodium 137, GFR >60 03/11/2021 Creatinine 1.23, BUN 23, Potassium 4.4, Sodium 137, GFR >60 02/26/2021 Creatinine 1.25, BUN 23, Potassium 4.0, Sodium 137, GFR 59 02/18/2021 Creatinine 1.20, BUN 20, Potassium 4.7, Sodium 134, GFR > 60 01/29/2021 Creatinine 1.30, BUN 18, Potassium 4.6, Sodium 139  12/24/2020 Creatinine 1.33, BUN 18, Potassium 4.4, Sodium 137, GFR 55 12/04/2020 Creatinine 1.26, BUN 25, Potassium 4.7, Sodium 139, GFR 58 11/05/2020 Creatinine 1.39, BUN 20, Potassium 4.5, Sodium 136, GFR 52  A complete set of results can be found in Results Review.   Recommendations:  No changes.   Follow-up plan: ICM clinic phone appointment on 05/19/2021 to recheck fluid levels.   91 day device clinic remote transmission 06/24/2021.      EP/Cardiology Office Visits:  05/22/2021 with Dr Lovena Le.   06/12/2021 with Dr Aundra Dubin.   Copy of ICM check sent to Dr. Lovena Le.    3 month ICM trend: 05/11/2021.    1 Year ICM trend:       [name], RN 05/12/2021 1:39 PM

## 2021-05-14 ENCOUNTER — Ambulatory Visit (INDEPENDENT_AMBULATORY_CARE_PROVIDER_SITE_OTHER): Payer: Medicare Other

## 2021-05-14 DIAGNOSIS — Z5181 Encounter for therapeutic drug level monitoring: Secondary | ICD-10-CM

## 2021-05-14 DIAGNOSIS — I4819 Other persistent atrial fibrillation: Secondary | ICD-10-CM

## 2021-05-14 LAB — POCT INR: INR: 2.9 (ref 2.0–3.0)

## 2021-05-14 NOTE — Patient Instructions (Signed)
Description   Spoke with pt and advised him to take 1 tablet today and then continue taking 1/2 tablet daily except 1 tablet on Mondays and Fridays. AMIO DOSE INCREASED TO 400MG  ON SAT & SUN ALL OTHER DAYS STAY AT 200MG . Recheck INR in 2 weeks. Pt's cell phone [phone] in case difficulty reaching pt on home phone.

## 2021-05-15 ENCOUNTER — Other Ambulatory Visit (HOSPITAL_COMMUNITY): Payer: Self-pay

## 2021-05-15 NOTE — Telephone Encounter (Signed)
Advanced Heart Failure Patient Advocate Encounter  Prior Authorization for Austin [name] has been approved.    PA# [id number] Effective dates: 05/12/21 through 07/25/22  Patients co-pay is $163.66 (30 days)

## 2021-05-19 ENCOUNTER — Ambulatory Visit (INDEPENDENT_AMBULATORY_CARE_PROVIDER_SITE_OTHER): Payer: Medicare Other

## 2021-05-19 DIAGNOSIS — Z95810 Presence of automatic (implantable) cardiac defibrillator: Secondary | ICD-10-CM

## 2021-05-19 DIAGNOSIS — I5022 Chronic systolic (congestive) heart failure: Secondary | ICD-10-CM

## 2021-05-21 ENCOUNTER — Ambulatory Visit (HOSPITAL_COMMUNITY)
Admission: RE | Admit: 2021-05-21 | Discharge: 2021-05-21 | Disposition: A | Discharge status: Home / Self Care | Payer: Medicare Other | Source: Ambulatory Visit | Attending: Cardiology | Admitting: Cardiology

## 2021-05-21 ENCOUNTER — Encounter (HOSPITAL_COMMUNITY): Payer: Self-pay

## 2021-05-21 ENCOUNTER — Other Ambulatory Visit: Payer: Self-pay

## 2021-05-21 DIAGNOSIS — I5022 Chronic systolic (congestive) heart failure: Secondary | ICD-10-CM | POA: Insufficient documentation

## 2021-05-21 LAB — BASIC METABOLIC PANEL
Anion gap: 8 (ref 5–15)
BUN: 27 mg/dL — ABNORMAL HIGH (ref 8–23)
CO2: 25 mmol/L (ref 22–32)
Calcium: 8.7 mg/dL — ABNORMAL LOW (ref 8.9–10.3)
Chloride: 100 mmol/L (ref 98–111)
Creatinine, Ser: 1.36 mg/dL — ABNORMAL HIGH (ref 0.61–1.24)
GFR, Estimated: 53 mL/min — ABNORMAL LOW (ref >60)
Glucose, Bld: 97 mg/dL (ref 70–99)
Potassium: 3.9 mmol/L (ref 3.5–5.1)
Sodium: 133 mmol/L — ABNORMAL LOW (ref 135–145)

## 2021-05-22 ENCOUNTER — Ambulatory Visit: Payer: Medicare Other | Admitting: Internal Medicine

## 2021-05-22 VITALS — BP 138/78 | HR 85 | Ht 74.0 in | Wt 199.4 lb

## 2021-05-22 DIAGNOSIS — I472 Ventricular tachycardia, unspecified: Secondary | ICD-10-CM

## 2021-05-22 DIAGNOSIS — Z95810 Presence of automatic (implantable) cardiac defibrillator: Secondary | ICD-10-CM | POA: Diagnosis not present

## 2021-05-22 DIAGNOSIS — I5022 Chronic systolic (congestive) heart failure: Secondary | ICD-10-CM | POA: Diagnosis not present

## 2021-05-22 DIAGNOSIS — I4819 Other persistent atrial fibrillation: Secondary | ICD-10-CM | POA: Diagnosis not present

## 2021-05-22 LAB — CUP PACEART INCLINIC DEVICE CHECK
Battery Remaining Longevity: 40 mo
Battery Voltage: 2.95 V
Brady Statistic RA Percent Paced: 5.31 %
Brady Statistic RV Percent Paced: 89.46 %
Date Time Interrogation Session: 20221028152530
HighPow Impedance: 66 Ohm
Implantable Lead Implant Date: 20130110
Implantable Lead Implant Date: 20130110
Implantable Lead Location: 753859
Implantable Lead Location: 753860
Implantable Lead Model: 5076
Implantable Lead Model: 6935
Implantable Pulse Generator Implant Date: 20181010
Lead Channel Impedance Value: 304 Ohm
Lead Channel Impedance Value: 361 Ohm
Lead Channel Impedance Value: 399 Ohm
Lead Channel Pacing Threshold Amplitude: 1 V
Lead Channel Pacing Threshold Pulse Width: 0.4 ms
Lead Channel Sensing Intrinsic Amplitude: 0.375 mV
Lead Channel Sensing Intrinsic Amplitude: 1 mV
Lead Channel Sensing Intrinsic Amplitude: 3.5 mV
Lead Channel Sensing Intrinsic Amplitude: 5 mV
Lead Channel Setting Pacing Amplitude: 2.5 V
Lead Channel Setting Pacing Amplitude: 3.5 V
Lead Channel Setting Pacing Pulse Width: 0.4 ms
Lead Channel Setting Sensing Sensitivity: 0.3 mV

## 2021-05-22 NOTE — Progress Notes (Signed)
HPI Mr. Keidel returns today for followup of VT, atrial fib, CHB, chronic systolic heart failure and ICD followup. He was seen by Dr. Aundra Dubin and is referred back today to discuss left bundle area pacing. He has felt poorly the last day. He stated he considered going to the ED. His dose of amio was decreased to 100 mg daily. No Known Allergies   Current Outpatient Medications  Medication Sig Dispense Refill   acetaminophen (TYLENOL) 500 MG tablet Take 1,000 mg by mouth every 6 (six) hours as needed (pain).     amiodarone (PACERONE) 200 MG tablet Take one tablet by mouth Monday through Friday.  Take TWO tablets by mouth Saturday and Sunday. 108 tablet 3   amoxicillin (AMOXIL) 500 MG capsule TAKE 4 CAPSULES BY MOUTH 30 MINUTES PRIOR TO DENTAL WORK 4 capsule 2   furosemide (LASIX) 20 MG tablet Take 1 tablet (20 mg total) by mouth daily. 30 tablet 11   levothyroxine (SYNTHROID) 50 MCG tablet Take 1 tablet (50 mcg total) by mouth daily before breakfast. 90 tablet 2   melatonin 5 MG TABS Take 5 mg by mouth at bedtime.     metoprolol succinate (TOPROL-XL) 25 MG 24 hr tablet Take 0.5 tablets (12.5 mg total) by mouth daily. Take with or immediately following a meal. 45 tablet 3   Multiple Vitamin (MULTIVITAMIN WITH MINERALS) TABS tablet Take 1 tablet by mouth in the morning.     nitroGLYCERIN (NITROSTAT) 0.4 MG SL tablet Place 1 tablet (0.4 mg total) under the tongue every 5 (five) minutes x 3 doses as needed for chest pain. 25 tablet 2   valsartan (DIOVAN) 40 MG tablet Take 0.5 tablets (20 mg total) by mouth 2 (two) times daily. 30 tablet 11   Vericiguat (VERQUVO) 2.5 MG TABS Take 1 tablet by mouth daily. 30 tablet 11   warfarin (COUMADIN) 5 MG tablet TAKE AS DIRECTED BY THE COUMADIN CLINIC 80 tablet 1   No current facility-administered medications for this visit.     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   Past Surgical History:  Procedure Laterality Date   BIV ICD GENERATOR CHANGEOUT N/A 05/04/2017   Procedure: BiV ICD Generator Changeout;  Surgeon: Evans Lance, MD;  Location: [facility];  Service: Cardiovascular;  Laterality: N/A;   CARDIAC CATHETERIZATION  04/24/2002   CARDIAC VALVE REPLACEMENT     CARDIOVERSION  03/09/2012   Procedure: CARDIOVERSION;  Surgeon: Evans Lance, MD;  Location: [facility];  Service: Cardiovascular;  Laterality: N/A;   CARDIOVERSION N/A 11/22/2013   Procedure: CARDIOVERSION;  Surgeon: Sanda Klein, MD;  Location: [facility];  Service: Cardiovascular;  Laterality: N/A;   dental implants     ELECTROPHYSIOLOGIC STUDY N/A 09/15/2015  Procedure: V Tach Ablation;  Surgeon: Evans Lance, MD;  Location: [facility];  Service: Cardiovascular;  Laterality: N/A;   Evacution of epidural lumbar epidural abscess  1999   INSERT / REPLACE / REMOVE PACEMAKER  11/2008   MITRAL VALVE REPLACEMENT     w #[id number] [facility]   PERMANENT PACEMAKER INSERTION N/A 08/05/2011   Procedure: PERMANENT PACEMAKER INSERTION;  Surgeon: Evans Lance, MD;  Location: [facility];  Service: Cardiovascular;  Laterality: N/A;   RIGHT HEART CATH N/A 02/26/2021   Procedure: RIGHT HEART CATH;  Surgeon: Larey Dresser, MD;  Location: [facility];  Service: Cardiovascular;  Laterality:  N/A;   THYROIDECTOMY     TONSILLECTOMY     VALVE REPLACEMENT  2000     Family History  Problem Relation Age of Onset   Heart disease Mother    Heart failure Mother    Heart disease Father    Heart failure Father      Social History   Socioeconomic History   Marital status: Married    Spouse name: Not on file   Number of children: 1   Years of education: 96   Highest education level: Master's degree (e.g., MA, MS, MEng, MEd, MSW, MBA)  Occupational History   Occupation: retired from [employer], manufacturing systems  Tobacco Use   Smoking status: Never   Smokeless tobacco: Never  Vaping Use   Vaping Use: Never used  Substance and Sexual Activity   Alcohol use: Yes    Alcohol/week: 0.0 standard drinks    Comment: occasionally   Drug use: No   Sexual activity: Not on file  Other Topics Concern   Not on file  Social History Narrative   Lives with wife in a one story home.  Has one daughter.  Retired.  Education: Masters.   Social Determinants of Health   Financial Resource Strain: Not on file  Food Insecurity: Not on file  Transportation Needs: Not on file  Physical Activity: Not on file  Stress: Not on file  Social Connections: Not on file  Intimate Partner Violence: Not on file     BP 138/78   Pulse 85   Ht 6\' 2"  (1.88 m)   Wt 199 lb 6.4 oz (90.4 kg)   SpO2 96%   BMI 25.60 kg/m   Physical Exam:  stable appearing NAD HEENT: Unremarkable Neck:  No JVD, no thyromegally Lymphatics:  No adenopathy Back:  No CVA tenderness Lungs:  Clear with no wheezes HEART:  Regular rate rhythm, no murmurs, no rubs, no clicks Abd:  soft, positive bowel sounds, no organomegally, no rebound, no guarding Ext:  2 plus pulses, no edema, no cyanosis, no clubbing Skin:  No rashes no nodules Neuro:  CN II through XII intact, motor grossly intact  EKG - atrial fib with a controlled VR  DEVICE  Normal device function.  See PaceArt for details.   Assess/Plan:  1. Atrial fib/flutter -  his rates are well controlled. He has left atrial flutter. I attempted to pace him back to rhythm but his atrial flutter was replaced by atrial fib. 2. 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 I do not appreciate MR or MS on exam.  3. 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  4. VT - he was back in VT in the office. He has increased the amio to 200 mg  daily and bid on sat/sun. He feels poorly I suspect the amio is making him feel poorly. He had a nice result from VT ablation years ago. I think if we could get his VT controlled and cut back on the amio he would feel better. I will arrange a telemedicine consult with Dr. Noralee Stain. 5. Chronic systolic heart failure -the results of his heart cath were reviewed. He has a low output state despite maximal medical therapy. I outlined the possibility of placing a left bundle area lead but also discussed the drawbacks including the risk of infection which could be catastrophic in that his left side is not able to be utilized, at least not the left subclavian vein. In light of the risks of another big surgery, likely requiring device extraction, we will undergo watchful waiting.    Carleene Overlie Halyn Flaugher,MD

## 2021-05-22 NOTE — Patient Instructions (Addendum)
Medication Instructions:  Your physician recommends that you continue on your current medications as directed. Please refer to the Current Medication list given to you today.  Labwork: None ordered.  Testing/Procedures: None ordered.  Follow-Up:  You will follow up with Dr. Noralee Stain.  Any Other Special Instructions Will Be Listed Below (If Applicable).  If you need a refill on your cardiac medications before your next appointment, please call your pharmacy.

## 2021-05-22 NOTE — Progress Notes (Signed)
EPIC Encounter for ICM Monitoring  Patient Name: Austin [name] is a 80 y.o. male Date: 05/22/2021 Primary Care Physican: Lajean Manes, MD Primary Cardiologist: Smith/McLean Electrophysiologist: Lovena Le 04/06/2021 Weight: 192 lbs 05/22/2021 Weight: 195 lbs   Time in AT/AF 24.0 hr/day (100.0%) Longest AT/AF 4 days            Spoke with patient and heart failure questions reviewed.  Pt sent Dr Aundra Dubin a my chart message saying he had weight gain this week and instructed to increase Furosemide to 40 mg x 3 days and then back to 20 mg.  He reports weight gain of 6 lbs in week.  He reports he does eat restaurant foods.    Optivol Thoracic impedance suggesting possible fluid accumulation starting 04/09/2021.    Prescribed:  Furosemide 20 mg Take 1 tablet (20 mg total) by mouth daily.   Labs: 05/11/2021 Creatinine 1.22, BUN 19, Potassium 4.2, Sodium 137, GFR >60 03/11/2021 Creatinine 1.23, BUN 23, Potassium 4.4, Sodium 137, GFR >60 02/26/2021 Creatinine 1.25, BUN 23, Potassium 4.0, Sodium 137, GFR 59 02/18/2021 Creatinine 1.20, BUN 20, Potassium 4.7, Sodium 134, GFR > 60 01/29/2021 Creatinine 1.30, BUN 18, Potassium 4.6, Sodium 139  12/24/2020 Creatinine 1.33, BUN 18, Potassium 4.4, Sodium 137, GFR 55 12/04/2020 Creatinine 1.26, BUN 25, Potassium 4.7, Sodium 139, GFR 58 11/05/2020 Creatinine 1.39, BUN 20, Potassium 4.5, Sodium 136, GFR 52  A complete set of results can be found in Results Review.   Recommendations: Discussed avoiding restaurant foods since are typically high in salt.  Encouraged to limit salt intake to 2000 mg daily.     Follow-up plan: ICM clinic phone appointment on 05/25/2021 to recheck fluid levels.   91 day device clinic remote transmission 06/24/2021.      EP/Cardiology Office Visits:    06/12/2021 with Dr Aundra Dubin.   Copy of ICM check sent to Dr. Lovena Le.     3 month ICM trend: 05/22/2021.    1 Year ICM trend:       [name], RN 05/22/2021 2:20  PM

## 2021-05-25 ENCOUNTER — Ambulatory Visit (INDEPENDENT_AMBULATORY_CARE_PROVIDER_SITE_OTHER): Payer: Medicare Other

## 2021-05-25 ENCOUNTER — Encounter (HOSPITAL_COMMUNITY): Payer: Self-pay

## 2021-05-25 DIAGNOSIS — Z95810 Presence of automatic (implantable) cardiac defibrillator: Secondary | ICD-10-CM

## 2021-05-25 DIAGNOSIS — I5022 Chronic systolic (congestive) heart failure: Secondary | ICD-10-CM

## 2021-05-25 NOTE — Progress Notes (Signed)
Continue Lasix at 40 mg daily, recheck Optivol later this week.

## 2021-05-25 NOTE — Progress Notes (Signed)
Attempted call to patient and unable to reach.    

## 2021-05-25 NOTE — Progress Notes (Signed)
Message to Dr Aundra Dubin advising next remote transmission will be 11/7 and should pt continue Lasix 40mg  until that time and he confirmed yes.

## 2021-05-25 NOTE — Progress Notes (Signed)
EPIC Encounter for ICM Monitoring  Patient Name: Austin [name] is a 80 y.o. male Date: 05/25/2021 Primary Care Physican: Lajean Manes, MD Primary Cardiologist: Smith/McLean Electrophysiologist: Lovena Le 04/06/2021 Weight: 192 lbs 05/06/2021 Weight: 189.9 lbs 05/15/2021 Weight: 193.1 lbs 05/22/2021 Weight: 195 lbs 05/25/2021 Weight: 197.9 lbs   Since 22-May-2021 Time in AT/AF 24.0 hr/day (100.0%) Longest AT/AF 3 days            Spoke with patient and heart failure questions reviewed.  Pt reports the following:        1.  Weight gain of 7 lbs since 10/21 despite taking extra Furosemide x 3 days.          2.  Poor urine output even with extra Furosemide.        3.  Weight gain started after taking new meds: Valsartan and Vericiguat on 10/17.     Optivol Thoracic impedance suggesting possible fluid accumulation cointinues after increasing Furosemide to 40 mg daily x 3 day as instructed by Dr Aundra Dubin on 10/27.    Prescribed:  Furosemide 20 mg Take 1 tablet (20 mg total) by mouth daily.   Labs: 05/11/2021 Creatinine 1.22, BUN 19, Potassium 4.2, Sodium 137, GFR >60 03/11/2021 Creatinine 1.23, BUN 23, Potassium 4.4, Sodium 137, GFR >60 02/26/2021 Creatinine 1.25, BUN 23, Potassium 4.0, Sodium 137, GFR 59 02/18/2021 Creatinine 1.20, BUN 20, Potassium 4.7, Sodium 134, GFR > 60 01/29/2021 Creatinine 1.30, BUN 18, Potassium 4.6, Sodium 139  12/24/2020 Creatinine 1.33, BUN 18, Potassium 4.4, Sodium 137, GFR 55 12/04/2020 Creatinine 1.26, BUN 25, Potassium 4.7, Sodium 139, GFR 58 11/05/2020 Creatinine 1.39, BUN 20, Potassium 4.5, Sodium 136, GFR 52  A complete set of results can be found in Results Review.   Recommendations:  Copy sent to Dr Aundra Dubin for review and recommendations. Pt is sending my chart message to Dr Aundra Dubin as well.   Follow-up plan: ICM clinic phone appointment on 06/01/2021 to recheck fluid levels.   91 day device clinic remote transmission 06/24/2021.       EP/Cardiology Office Visits:    06/12/2021 with Dr Aundra Dubin.   Copy of ICM check sent to Dr. Lovena Le.      3 month ICM trend: 05/25/2021.    1 Year ICM trend:       [name], RN 05/25/2021 1:53 PM

## 2021-05-25 NOTE — Progress Notes (Signed)
Spoke with patient and advised Dr Aundra Dubin recommended he continue to take Lasix 40 mg daily until Optivol recheck on 11/7.  He agreed with plan and will send remote transmission on 11/7.  Advised to contact HF clinic directly on 11/3 & 11/4 if needed due to I will be out of the office.

## 2021-05-26 ENCOUNTER — Encounter: Payer: Self-pay | Admitting: Internal Medicine

## 2021-05-26 ENCOUNTER — Telehealth: Payer: Self-pay | Admitting: Internal Medicine

## 2021-05-26 NOTE — Telephone Encounter (Signed)
  [facility] - Dr. Floria Raveling office calling would like to speak with RN Sonia Baller, she said she received a fax from her and she have some questions

## 2021-05-26 NOTE — Telephone Encounter (Signed)
Returned call to Dr. Eugenie Norrie office.  Austin [name] requesting notes.  Sent as requested.

## 2021-05-28 ENCOUNTER — Ambulatory Visit (INDEPENDENT_AMBULATORY_CARE_PROVIDER_SITE_OTHER): Payer: Medicare Other

## 2021-05-28 ENCOUNTER — Telehealth: Payer: Self-pay | Admitting: Internal Medicine

## 2021-05-28 DIAGNOSIS — I4819 Other persistent atrial fibrillation: Secondary | ICD-10-CM | POA: Diagnosis not present

## 2021-05-28 DIAGNOSIS — Z5181 Encounter for therapeutic drug level monitoring: Secondary | ICD-10-CM | POA: Diagnosis not present

## 2021-05-28 DIAGNOSIS — Z952 Presence of prosthetic heart valve: Secondary | ICD-10-CM | POA: Diagnosis not present

## 2021-05-28 LAB — POCT INR: INR: 2.2 (ref 2.0–3.0)

## 2021-05-28 MED ORDER — WARFARIN SODIUM 5 MG PO TABS
ORAL_TABLET | ORAL | 1 refills | Status: DC
Start: 2021-05-28 — End: 2022-05-03

## 2021-05-28 NOTE — Patient Instructions (Signed)
Description   Spoke with pt and advised him to take 1 tablet today and 1.5 tablets tomorrow and then START taking 1/2 tablet daily except 1 tablet on Mondays, Wednesdays and Fridays. AMIO DOSE INCREASED TO 400MG  ON SAT & SUN ALL OTHER DAYS STAY AT 200MG . Recheck INR in 2 weeks. Pt's cell phone [phone] in case difficulty reaching pt on home phone.

## 2021-05-28 NOTE — Telephone Encounter (Signed)
Prescription refill request received for warfarin Lov: 05/22/21 Lovena Le) Next INR check: 06/11/21 Warfarin tablet strength: 5mg   Appropriate dose and refill sent to requested pharmacy.

## 2021-05-28 NOTE — Telephone Encounter (Signed)
*  STAT* If patient is at the pharmacy, call can be transferred to refill team.   1. Which medications need to be refilled? (please list name of each medication and dose if known)  warfarin (COUMADIN) 5 MG tablet  2. Which pharmacy/location (including street and city if local pharmacy) is medication to be sent to? [id number] - [place], [place] - [address]   3. Do they need a 30 day or 90 day supply?  90 day supply

## 2021-06-01 ENCOUNTER — Ambulatory Visit (INDEPENDENT_AMBULATORY_CARE_PROVIDER_SITE_OTHER): Payer: Medicare Other

## 2021-06-01 DIAGNOSIS — I5022 Chronic systolic (congestive) heart failure: Secondary | ICD-10-CM

## 2021-06-01 DIAGNOSIS — Z95810 Presence of automatic (implantable) cardiac defibrillator: Secondary | ICD-10-CM

## 2021-06-02 NOTE — Progress Notes (Signed)
EPIC Encounter for ICM Monitoring  Patient Name: Austin [name] is a 80 y.o. male Date: 06/02/2021 Primary Care Physican: Lajean Manes, MD Primary Cardiologist: Smith/McLean Electrophysiologist: Lovena Le 04/06/2021 Weight: 192 lbs 05/06/2021 Weight: 189.9 lbs 05/15/2021 Weight: 193.1 lbs 05/22/2021 Weight: 195 lbs 05/25/2021 Weight: 197.9 lbs 06/02/2021 Weight: 192.5 lbs   Since 26-May-2021 Time in AT/AF 22.3 hr/day (93.1%) Longest AT/AF 19 hours            Spoke with patient and heart failure questions reviewed.  Pt reports 5 lb weight loss since taking Furosemide 40 mg x 7 days.  He resumed 20 mg Furosemide dosage on 11/7.   Optivol Thoracic impedance suggesting fluid levels are trending close to baseline after taking Furosemide 40 mg x 7 days.    Prescribed:  Furosemide 20 mg Take 1 tablet (20 mg total) by mouth daily.   Labs: 05/11/2021 Creatinine 1.22, BUN 19, Potassium 4.2, Sodium 137, GFR >60 03/11/2021 Creatinine 1.23, BUN 23, Potassium 4.4, Sodium 137, GFR >60 02/26/2021 Creatinine 1.25, BUN 23, Potassium 4.0, Sodium 137, GFR 59 02/18/2021 Creatinine 1.20, BUN 20, Potassium 4.7, Sodium 134, GFR > 60 01/29/2021 Creatinine 1.30, BUN 18, Potassium 4.6, Sodium 139  12/24/2020 Creatinine 1.33, BUN 18, Potassium 4.4, Sodium 137, GFR 55 12/04/2020 Creatinine 1.26, BUN 25, Potassium 4.7, Sodium 139, GFR 58 11/05/2020 Creatinine 1.39, BUN 20, Potassium 4.5, Sodium 136, GFR 52  A complete set of results can be found in Results Review.   Recommendations:  Copy sent to Dr Aundra Dubin for review and further recommendations if needed.     Follow-up plan: ICM clinic phone appointment on 06/22/2021 (will have fluid recheck at 11/18 OV with Dr Aundra Dubin).   91 day device clinic remote transmission 06/24/2021.      EP/Cardiology Office Visits:  06/12/2021 with Dr Aundra Dubin.   Copy of ICM check sent to Dr. Lovena Le.       3 month ICM trend: 06/01/2021.    1 Year ICM trend:       [name], RN 06/02/2021 10:56 AM

## 2021-06-03 DIAGNOSIS — I472 Ventricular tachycardia, unspecified: Secondary | ICD-10-CM | POA: Diagnosis not present

## 2021-06-03 NOTE — Progress Notes (Signed)
No changes for now.

## 2021-06-03 NOTE — Progress Notes (Signed)
Spoke with patient and advised Dr Aundra Dubin reviewed report and no changes at this time.  Discussed diet and he does not salt foods but does not read food labels to determine daily amount of salt intake.  He does eat out at restaurants a lot.  Advised foods high in salt may be contributing to fluid accumulation and try to limit salt intake.

## 2021-06-11 ENCOUNTER — Ambulatory Visit (INDEPENDENT_AMBULATORY_CARE_PROVIDER_SITE_OTHER): Payer: Medicare Other | Admitting: *Deleted

## 2021-06-11 DIAGNOSIS — Z5181 Encounter for therapeutic drug level monitoring: Secondary | ICD-10-CM | POA: Diagnosis not present

## 2021-06-11 DIAGNOSIS — I4819 Other persistent atrial fibrillation: Secondary | ICD-10-CM | POA: Diagnosis not present

## 2021-06-11 LAB — POCT INR: INR: 2.3 (ref 2.0–3.0)

## 2021-06-12 ENCOUNTER — Ambulatory Visit (HOSPITAL_COMMUNITY)
Admission: RE | Admit: 2021-06-12 | Discharge: 2021-06-12 | Disposition: A | Discharge status: Home / Self Care | Payer: Medicare Other | Source: Ambulatory Visit | Attending: Cardiology | Admitting: Cardiology

## 2021-06-12 ENCOUNTER — Other Ambulatory Visit: Payer: Self-pay

## 2021-06-12 ENCOUNTER — Encounter (HOSPITAL_COMMUNITY): Payer: Self-pay | Admitting: Cardiology

## 2021-06-12 VITALS — BP 108/68 | HR 70 | Wt 198.0 lb

## 2021-06-12 DIAGNOSIS — Z950 Presence of cardiac pacemaker: Secondary | ICD-10-CM | POA: Insufficient documentation

## 2021-06-12 DIAGNOSIS — Z952 Presence of prosthetic heart valve: Secondary | ICD-10-CM | POA: Insufficient documentation

## 2021-06-12 DIAGNOSIS — I442 Atrioventricular block, complete: Secondary | ICD-10-CM | POA: Insufficient documentation

## 2021-06-12 DIAGNOSIS — E039 Hypothyroidism, unspecified: Secondary | ICD-10-CM | POA: Insufficient documentation

## 2021-06-12 DIAGNOSIS — I472 Ventricular tachycardia, unspecified: Secondary | ICD-10-CM | POA: Insufficient documentation

## 2021-06-12 DIAGNOSIS — I4819 Other persistent atrial fibrillation: Secondary | ICD-10-CM

## 2021-06-12 DIAGNOSIS — Z7901 Long term (current) use of anticoagulants: Secondary | ICD-10-CM | POA: Insufficient documentation

## 2021-06-12 DIAGNOSIS — Z79890 Hormone replacement therapy (postmenopausal): Secondary | ICD-10-CM | POA: Insufficient documentation

## 2021-06-12 DIAGNOSIS — Z79899 Other long term (current) drug therapy: Secondary | ICD-10-CM | POA: Diagnosis not present

## 2021-06-12 DIAGNOSIS — I5022 Chronic systolic (congestive) heart failure: Secondary | ICD-10-CM | POA: Diagnosis not present

## 2021-06-12 LAB — COMPREHENSIVE METABOLIC PANEL
ALT: 27 U/L (ref 0–44)
AST: 37 U/L (ref 15–41)
Albumin: 3.7 g/dL (ref 3.5–5.0)
Alkaline Phosphatase: 64 U/L (ref 38–126)
Anion gap: 8 (ref 5–15)
BUN: 24 mg/dL — ABNORMAL HIGH (ref 8–23)
CO2: 26 mmol/L (ref 22–32)
Calcium: 8.8 mg/dL — ABNORMAL LOW (ref 8.9–10.3)
Chloride: 102 mmol/L (ref 98–111)
Creatinine, Ser: 1.38 mg/dL — ABNORMAL HIGH (ref 0.61–1.24)
GFR, Estimated: 52 mL/min — ABNORMAL LOW (ref >60)
Glucose, Bld: 93 mg/dL (ref 70–99)
Potassium: 4.5 mmol/L (ref 3.5–5.1)
Sodium: 136 mmol/L (ref 135–145)
Total Bilirubin: 1.2 mg/dL (ref 0.3–1.2)
Total Protein: 6.7 g/dL (ref 6.5–8.1)

## 2021-06-12 LAB — CBC
HCT: 42.9 % (ref 39.0–52.0)
Hemoglobin: 13.5 g/dL (ref 13.0–17.0)
MCH: 30.9 pg (ref 26.0–34.0)
MCHC: 31.5 g/dL (ref 30.0–36.0)
MCV: 98.2 fL (ref 80.0–100.0)
Platelets: 151 10*3/uL (ref 150–400)
RBC: 4.37 MIL/uL (ref 4.22–5.81)
RDW: 14.6 % (ref 11.5–15.5)
WBC: 7.2 10*3/uL (ref 4.0–10.5)
nRBC: 0 % (ref 0.0–0.2)

## 2021-06-12 LAB — BRAIN NATRIURETIC PEPTIDE: B Natriuretic Peptide: 529.6 pg/mL — ABNORMAL HIGH (ref 0.0–100.0)

## 2021-06-12 MED ORDER — VERQUVO 5 MG PO TABS: 2.5000 mg | ORAL_TABLET | Freq: Every day | ORAL | 6 refills | Status: DC

## 2021-06-12 MED ORDER — FUROSEMIDE 20 MG PO TABS: ORAL_TABLET | ORAL | 6 refills | Status: DC

## 2021-06-12 NOTE — Progress Notes (Signed)
Medication Samples have been provided to the patient.  Drug name: Truett Mainland       Strength: 5mg         Qty: 2  LOT: [id number]  Exp.Date: 08/28/21  Dosing instructions: take daily  The patient has been instructed regarding the correct time, dose, and frequency of taking this medication, including desired effects and most common side effects.   Rufus Cypert 11:11 AM 06/12/2021

## 2021-06-12 NOTE — Progress Notes (Signed)
ReDS Vest / Clip - 06/12/21 1100       ReDS Vest / Clip   Station Marker C    Ruler Value 29    ReDS Value Range Low volume    ReDS Actual Value 32

## 2021-06-12 NOTE — Patient Instructions (Addendum)
We have provided you with Verquvo samples. Take 1/2 tablet until December, then increase to 1 tablet daily.  Increase Lasix to 40mg  in the morning and 20mg  in the evening.  Labs done today, your results will be available in MyChart, we will contact you for abnormal readings.  Your physician recommends that you return for lab work in: 10 days   Your physician recommends that you schedule a follow-up appointment in: 6 weeks    If you have any questions or concerns before your next appointment please send [place] a message through [place] or call our office at [phone].    TO LEAVE A MESSAGE FOR THE NURSE SELECT OPTION 2, PLEASE LEAVE A MESSAGE INCLUDING: YOUR NAME DATE OF BIRTH CALL BACK NUMBER REASON FOR CALL**this is important as we prioritize the call backs  YOU WILL RECEIVE A CALL BACK THE SAME DAY AS LONG AS YOU CALL BEFORE 4:00 PM  At the [facility], you and your health needs are our priority. As part of our continuing mission to provide you with exceptional heart care, we have created designated Provider Care Teams. These Care Teams include your primary Cardiologist (physician) and Advanced Practice Providers (APPs- Physician Assistants and Nurse Practitioners) who all work together to provide you with the care you need, when you need it.   You may see any of the following providers on your designated Care Team at your next follow up: Dr Glori Bickers Dr Haynes Kerns, NP Lyda Jester, [place] [facility] [place], [place] Audry Riles, PharmD   Please be sure to bring in all your medications bottles to every appointment.

## 2021-06-14 NOTE — Progress Notes (Signed)
PCP: Austin Manes, MD Cardiology: Austin [name] HF Cardiology: Austin [name] EP: Austin [name]  80 y.o. with history of mechanical mitral valve s/p MV endocarditis, VT, nonischemic cardiomyopathy, complete heart block was referred by Austin [name] for evaluation of CHF.  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 He subsequently developed a presumed nonischemic cardiomyopathy.  This was complicated by VT, he had a VT ablation in 2017 and had a Medtronic CRT-D device implanted. This had to be removed due to infection in 11/12 and was reimplanted in 1/13. Last echo in 5/22 showed EF 30% with normal RV, mechanical MV stable with mean gradient 3 mmHg.  He has been in atrial fibrillation persistently since late 2021.  He is on amiodarone and has been cardioverted in the past. His LV lead is not functional.  He has complete heart block and is persistently RV pacing.   Had [place] 02/26/21 RA 4, PA 32/12, PCWP 11, CO 4.6, CI 2.2. Normal filling pressures and slightly low cardiac output.   Digoxin was stopped due to elevated level.   He saw Austin [name] in 9/22 and was in slow VT in the office.  He was paced out of VT and amiodarone was increased to 200 mg daily.   He saw Austin [name], and placement of a left bundle or LV lead was decided to be too risky a procedure.  He was referred to Dr. Oneida [name] with the hope that he could have a VT ablation and get off amiodarone, which is likely playing a role in his symptoms.   Today he returns for HF followup.  Overall, he is feeling somewhat better. Walking further, legs feels stronger.  He is short of breath after walking 100-200 feet. No lightheadedness or chest pain.  He does describe feeling a "brain fog."  He is currently out of atrial fibrillation.    Medtronic device interrogation: Fluid index >threshold but impedance trending back up, recently has been out of atrial fibrillation, 95% v-pacing.   ECG (personally reviewed): A-RV sequential  pacing.   REDS clip 32%  Labs (7/22): K 4.6, creatinine 1.3 Labs (8/22): K 4.4, creatinine 1.23 Labs (10/22): K 3.9, creatinine 1.36  PMH: 1. 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 2. H/o VT: VT ablation in 2017.  3. Atrial fibrillation/atypical flutter: Persistent since 2021.  Failed amiodarone.  4. PAD: Tibial occlusive disease.  5. 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  6. Hypothyroidism.  7. Chronic systolic CHF: Presumed nonischemic cardiomyopathy.  Medtronic CRT-D device, LV lead is nonfunctional.  - Echo (2012): EF 45% - Echo (1/17): EF 30-35% - Echo (6/21): EF 15-20% - Echo (11/21): EF 30-35%  - Echo (5/22): EF 30%, normal RV, mechanical mitral valve mean gradient 3 mmHg.  - RHC (8/22): RA mean 4, RV 33/4,PA 32/12 mean 21, PCWP mean 11, Oxygen saturations:PA 71% AO 98%, Cardiac Output (Fick) 4.66 Cardiac Index (Fick) 2.18  Social History   Socioeconomic History   Marital status: Married    Spouse name: Not on file   Number of children: 1   Years of education: 18   Highest education level: Master's degree (e.g., MA, MS, MEng, MEd, MSW, MBA)  Occupational History   Occupation: retired from [employer], manufacturing systems  Tobacco Use   Smoking status: Never   Smokeless tobacco: Never  Vaping Use   Vaping Use: Never used  Substance and Sexual Activity   Alcohol use: Yes    Alcohol/week: 0.0 standard drinks  Comment: occasionally   Drug use: No   Sexual activity: Not on file  Other Topics Concern   Not on file  Social History Narrative   Lives with wife in a one story home.  Has one daughter.  Retired.  Education: Masters.   Social Determinants of Health   Financial Resource Strain: Not on file  Food Insecurity: Not on file  Transportation Needs: Not on file  Physical Activity: Not on file  Stress: Not on file  Social Connections: Not on file  Intimate Partner Violence: Not on file   Family History  Problem Relation Age of Onset    Heart disease Mother    Heart failure Mother    Heart disease Father    Heart failure Father    ROS: All systems reviewed and negative except as per HPI.   Current Outpatient Medications  Medication Sig Dispense Refill   acetaminophen (TYLENOL) 500 MG tablet Take 1,000 mg by mouth every 6 (six) hours as needed (pain).     amiodarone (PACERONE) 200 MG tablet Take one tablet by mouth Monday through Friday.  Take TWO tablets by mouth Saturday and Sunday. 108 tablet 3   amoxicillin (AMOXIL) 500 MG capsule TAKE 4 CAPSULES BY MOUTH 30 MINUTES PRIOR TO DENTAL WORK 4 capsule 2   levothyroxine (SYNTHROID) 50 MCG tablet Take 1 tablet (50 mcg total) by mouth daily before breakfast. 90 tablet 2   melatonin 5 MG TABS Take 5 mg by mouth at bedtime.     metoprolol succinate (TOPROL-XL) 25 MG 24 hr tablet Take 0.5 tablets (12.5 mg total) by mouth daily. Take with or immediately following a meal. 45 tablet 3   Multiple Vitamin (MULTIVITAMIN WITH MINERALS) TABS tablet Take 1 tablet by mouth in the morning.     nitroGLYCERIN (NITROSTAT) 0.4 MG SL tablet Place 1 tablet (0.4 mg total) under the tongue every 5 (five) minutes x 3 doses as needed for chest pain. 25 tablet 2   valsartan (DIOVAN) 40 MG tablet Take 0.5 tablets (20 mg total) by mouth 2 (two) times daily. 30 tablet 11   Vericiguat (VERQUVO) 5 MG TABS Take 0.5 tablets (2.5 mg total) by mouth daily. 30 tablet 6   warfarin (COUMADIN) 5 MG tablet TAKE AS DIRECTED BY THE COUMADIN CLINIC 80 tablet 1   furosemide (LASIX) 20 MG tablet Take 2 tablets (40 mg total) by mouth every morning AND 1 tablet (20 mg total) every evening. 90 tablet 6   No current facility-administered medications for this encounter.   BP 108/68   Pulse 70   Wt 89.8 kg (198 lb)   SpO2 96%   BMI 25.42 kg/m  Wt Readings from Last 3 Encounters:  06/12/21 89.8 kg (198 lb)  05/22/21 90.4 kg (199 lb 6.4 oz)  05/11/21 89 kg (196 lb 3.2 oz)   General: NAD Neck: JVP 8-9 cm, no thyromegaly  or thyroid nodule.  Lungs: Clear to auscultation bilaterally with normal respiratory effort. CV: Nondisplaced PMI.  Heart regular S1/S2 with mechanical S1, no S3/S4, 1/6 SEM RUSB.  1+ edema to knees.  No carotid bruit.  Normal pedal pulses.  Abdomen: Soft, nontender, no hepatosplenomegaly, no distention.  Skin: Intact without lesions or rashes.  Neurologic: Alert and oriented x 3.  Psych: Normal affect. Extremities: No clubbing or cyanosis.  HEENT: Normal.   Assessment/Plan: 1. Chronic systolic CHF: Long-standing, suspected nonischemic cardiomyopathy.  Last echo in 5/22 with EF 30%, normal RV, mechanical mitral valve mean gradient 3 mmHg.  He has a Medtronic CRT-D device present, but the LV lead is not functional.  He has complete heart block, so is RV paced chronically.  He had been in atrial fibrillation for about a year but recently has been back in NSR.  Had [place] on 02/26/21 with normal filling pressures and mildly low cardiac output.  NYHA III, mildly better recently. Mild volume overload by exam and Optivol, though REDS clip is not significantly elevated.   - Increase Lasix to 40 qam/20 qpm with KCl 20 daily.   - Off digoxin due to persistently elevated level.  - With orthostatic symptoms, he is now off Entresto and on valsartan 20 mg bid.   - Continue Toprol XL 12.5 mg daily at bedtime.  - Limited ability to use GDMT due to low BP.  Increase Verquvo to 5 mg daily.  This should not have much effect on BP.  - The patient has CHB and is chronically RV pacing as his LV lead is nonfunctional.  This is not ideal and creates dyssynchrony.  Remains markedly symptomatic with limited ability to titrate meds. He has seen Austin [name], it would be possible to place left bundle lead but procedure would be complicated.  After further discussion with Austin [name], it was decided that risk would be too high and LB lead will not be attempted.   2. Atrial fibrillation: Persistent since 2021 until just recently.   Currently a-paced.  - Continue Warfarin.  - He is on amiodarone to maintain NSR.  3. Mechanical mitral valve: Valve looked normal on 5/22 echo.  - Continue warfarin, INR goal 3-3.5. CBC today.  - He has not been on ASA.  4. VT: History of VT and VT ablation in 2017.  MDT ICD.  He is on amiodarone, this was increased to 200 mg daily recently with recurrent VT.  I wonder if amiodarone is causing some of his low output-type symptoms like fatigue and nausea.  - Continue current amiodarone, check LFTs today.  He is on Levoxyl for hypothyroidism.  He will need a regular eye exam.  - He has been referred to Dr. Oneida [name] for attempt at redo VT ablation.  This may allow him to stop amiodarone.  5. Complete heart block: Per notes, patient is pacer dependent due to CHB.  Currently RV pacing chronically.  - See above, upgrade to CRT thought to be too risky.    Followup in 6 wks with APP  Loralie Champagne 06/14/2021

## 2021-06-22 ENCOUNTER — Ambulatory Visit (INDEPENDENT_AMBULATORY_CARE_PROVIDER_SITE_OTHER): Payer: Medicare Other

## 2021-06-22 DIAGNOSIS — Z95810 Presence of automatic (implantable) cardiac defibrillator: Secondary | ICD-10-CM | POA: Diagnosis not present

## 2021-06-24 ENCOUNTER — Ambulatory Visit (INDEPENDENT_AMBULATORY_CARE_PROVIDER_SITE_OTHER): Payer: Medicare Other

## 2021-06-24 ENCOUNTER — Telehealth: Payer: Self-pay

## 2021-06-24 DIAGNOSIS — L853 Xerosis cutis: Secondary | ICD-10-CM | POA: Diagnosis not present

## 2021-06-24 DIAGNOSIS — L812 Freckles: Secondary | ICD-10-CM | POA: Diagnosis not present

## 2021-06-24 DIAGNOSIS — I472 Ventricular tachycardia, unspecified: Secondary | ICD-10-CM

## 2021-06-24 DIAGNOSIS — L570 Actinic keratosis: Secondary | ICD-10-CM | POA: Diagnosis not present

## 2021-06-24 DIAGNOSIS — Z85828 Personal history of other malignant neoplasm of skin: Secondary | ICD-10-CM | POA: Diagnosis not present

## 2021-06-24 DIAGNOSIS — L821 Other seborrheic keratosis: Secondary | ICD-10-CM | POA: Diagnosis not present

## 2021-06-24 LAB — CUP PACEART REMOTE DEVICE CHECK
Battery Remaining Longevity: 37 mo
Battery Voltage: 2.95 V
Brady Statistic AP VP Percent: 99.46 %
Brady Statistic AP VS Percent: 0.22 %
Brady Statistic AS VP Percent: 0.25 %
Brady Statistic AS VS Percent: 0.06 %
Brady Statistic RA Percent Paced: 99.51 %
Brady Statistic RV Percent Paced: 99.19 %
Date Time Interrogation Session: 20221128022824
HighPow Impedance: 72 Ohm
Implantable Lead Implant Date: 20130110
Implantable Lead Implant Date: 20130110
Implantable Lead Location: 753859
Implantable Lead Location: 753860
Implantable Lead Model: 5076
Implantable Lead Model: 6935
Implantable Pulse Generator Implant Date: 20181010
Lead Channel Impedance Value: 342 Ohm
Lead Channel Impedance Value: 361 Ohm
Lead Channel Impedance Value: 399 Ohm
Lead Channel Pacing Threshold Amplitude: 1 V
Lead Channel Pacing Threshold Pulse Width: 0.4 ms
Lead Channel Sensing Intrinsic Amplitude: 1.125 mV
Lead Channel Sensing Intrinsic Amplitude: 1.125 mV
Lead Channel Sensing Intrinsic Amplitude: 15.875 mV
Lead Channel Sensing Intrinsic Amplitude: 15.875 mV
Lead Channel Setting Pacing Amplitude: 2.5 V
Lead Channel Setting Pacing Amplitude: 3.5 V
Lead Channel Setting Pacing Pulse Width: 0.4 ms
Lead Channel Setting Sensing Sensitivity: 0.3 mV

## 2021-06-24 NOTE — Progress Notes (Signed)
EPIC Encounter for ICM Monitoring  Patient Name: Austin [name] is a 80 y.o. male Date: 06/24/2021 Primary Care Physican: Lajean Manes, MD Primary Cardiologist: [facility]Lean Electrophysiologist: Lovena Le 06/02/2021 Weight: 192.5 lbs          Attempted call to patient and unable to reach.  Left detailed message per DPR regarding transmission. Transmission reviewed.    Optivol Thoracic impedance suggesting normal fluid levels since 11/18 when Furosemide dosage was increased.    Prescribed:  Furosemide 20 mg Take 2 tablets (40 mg total) by mouth every morning AND 1 tablet (20 mg total) every evening.   Labs: 06/12/2021 Creatinine 1.38, BUN 24, Potassium 4.5, Sodium 136, GFR 52 05/11/2021 Creatinine 1.22, BUN 19, Potassium 4.2, Sodium 137, GFR >60 03/11/2021 Creatinine 1.23, BUN 23, Potassium 4.4, Sodium 137, GFR >60 02/26/2021 Creatinine 1.25, BUN 23, Potassium 4.0, Sodium 137, GFR 59 A complete set of results can be found in Results Review.   Recommendations:  Left voice mail with ICM number and encouraged to call if experiencing any fluid symptoms.   Follow-up plan: ICM clinic phone appointment on 07/28/2021.   91 day device clinic remote transmission 09/23/2021.      EP/Cardiology Office Visits:  07/29/2021 with [facility].   Copy of ICM check sent to Dr. Lovena Le.      3 month ICM trend: 06/22/2021.    12-14 Month ICM trend:       [name], RN 06/24/2021 3:46 PM

## 2021-06-24 NOTE — Telephone Encounter (Signed)
Remote ICM transmission received.  Attempted call to patient regarding ICM remote transmission and left detailed message per DPR.  Advised to return call for any fluid symptoms or questions. Next ICM remote transmission scheduled 07/28/2021.

## 2021-06-25 ENCOUNTER — Ambulatory Visit (HOSPITAL_COMMUNITY)
Admission: RE | Admit: 2021-06-25 | Discharge: 2021-06-25 | Disposition: A | Discharge status: Home / Self Care | Payer: Medicare Other | Source: Ambulatory Visit | Attending: Internal Medicine | Admitting: Internal Medicine

## 2021-06-25 ENCOUNTER — Ambulatory Visit (INDEPENDENT_AMBULATORY_CARE_PROVIDER_SITE_OTHER): Payer: Medicare Other | Admitting: *Deleted

## 2021-06-25 ENCOUNTER — Other Ambulatory Visit: Payer: Self-pay

## 2021-06-25 DIAGNOSIS — I4819 Other persistent atrial fibrillation: Secondary | ICD-10-CM

## 2021-06-25 DIAGNOSIS — I5022 Chronic systolic (congestive) heart failure: Secondary | ICD-10-CM | POA: Insufficient documentation

## 2021-06-25 DIAGNOSIS — Z5181 Encounter for therapeutic drug level monitoring: Secondary | ICD-10-CM

## 2021-06-25 LAB — BASIC METABOLIC PANEL
Anion gap: 8 (ref 5–15)
BUN: 31 mg/dL — ABNORMAL HIGH (ref 8–23)
CO2: 29 mmol/L (ref 22–32)
Calcium: 8.7 mg/dL — ABNORMAL LOW (ref 8.9–10.3)
Chloride: 100 mmol/L (ref 98–111)
Creatinine, Ser: 1.39 mg/dL — ABNORMAL HIGH (ref 0.61–1.24)
GFR, Estimated: 52 mL/min — ABNORMAL LOW (ref >60)
Glucose, Bld: 103 mg/dL — ABNORMAL HIGH (ref 70–99)
Potassium: 4.2 mmol/L (ref 3.5–5.1)
Sodium: 137 mmol/L (ref 135–145)

## 2021-06-25 LAB — POCT INR: INR: 4.5 — AB (ref 2.0–3.0)

## 2021-06-25 NOTE — Patient Instructions (Signed)
Description   Spoke with pt and advised him to hold today's dose then start taking 1/2 tablet daily except 1 tablet on Monday, Wednesday, and Friday. AMIO DOSE INCREASED TO 400MG  ON SAT & SUN ALL OTHER DAYS STAY AT 200MG . Recheck INR in 2 weeks. Pt's cell phone [phone] in case difficulty reaching pt on home phone.

## 2021-07-02 NOTE — Progress Notes (Signed)
Remote ICD transmission.   

## 2021-07-07 ENCOUNTER — Encounter (HOSPITAL_COMMUNITY): Payer: Self-pay

## 2021-07-07 ENCOUNTER — Encounter (HOSPITAL_COMMUNITY): Payer: Self-pay | Admitting: Cardiology

## 2021-07-09 ENCOUNTER — Telehealth (HOSPITAL_COMMUNITY): Payer: Self-pay | Admitting: Pharmacy Technician

## 2021-07-09 ENCOUNTER — Ambulatory Visit (INDEPENDENT_AMBULATORY_CARE_PROVIDER_SITE_OTHER): Payer: Medicare Other | Admitting: *Deleted

## 2021-07-09 DIAGNOSIS — I4819 Other persistent atrial fibrillation: Secondary | ICD-10-CM | POA: Diagnosis not present

## 2021-07-09 DIAGNOSIS — Z5181 Encounter for therapeutic drug level monitoring: Secondary | ICD-10-CM | POA: Diagnosis not present

## 2021-07-09 LAB — POCT INR: INR: 2.9 (ref 2.0–3.0)

## 2021-07-09 NOTE — Telephone Encounter (Signed)
Advanced Heart Failure Patient Advocate Encounter  Called and left patient a message, can try to seek assistance for Verquvo. Would need to start application in order to apply.  Charlann Boxer, CPhT

## 2021-07-10 ENCOUNTER — Telehealth (HOSPITAL_COMMUNITY): Payer: Self-pay | Admitting: Pharmacist

## 2021-07-10 ENCOUNTER — Other Ambulatory Visit (HOSPITAL_COMMUNITY): Payer: Self-pay

## 2021-07-10 MED ORDER — VERQUVO 5 MG PO TABS
5.0000 mg | ORAL_TABLET | Freq: Every day | ORAL | 6 refills | Status: DC
Start: 2021-07-10 — End: 2022-01-25

## 2021-07-10 NOTE — Telephone Encounter (Signed)
Sent updated Verquvo prescription to [facility]. Kathlee Nations has already reached out to start the patient assistance process. Left VM for patient to call me back if he has any questions. Will try to reach patient again on Monday.

## 2021-07-10 NOTE — Telephone Encounter (Signed)
Verquvo prescription sent to [facility].  Audry Riles, PharmD, BCPS, BCCP, CPP Heart Failure Clinic Pharmacist [phone]

## 2021-07-13 ENCOUNTER — Encounter (HOSPITAL_COMMUNITY): Payer: Self-pay | Admitting: Pharmacy Technician

## 2021-07-28 ENCOUNTER — Ambulatory Visit (INDEPENDENT_AMBULATORY_CARE_PROVIDER_SITE_OTHER): Payer: Medicare Other

## 2021-07-28 DIAGNOSIS — I4819 Other persistent atrial fibrillation: Secondary | ICD-10-CM | POA: Diagnosis not present

## 2021-07-28 DIAGNOSIS — Z5181 Encounter for therapeutic drug level monitoring: Secondary | ICD-10-CM

## 2021-07-28 DIAGNOSIS — Z95810 Presence of automatic (implantable) cardiac defibrillator: Secondary | ICD-10-CM

## 2021-07-28 DIAGNOSIS — I5022 Chronic systolic (congestive) heart failure: Secondary | ICD-10-CM | POA: Diagnosis not present

## 2021-07-28 DIAGNOSIS — Z952 Presence of prosthetic heart valve: Secondary | ICD-10-CM | POA: Diagnosis not present

## 2021-07-28 LAB — POCT INR: INR: 4.7 — AB (ref 2.0–3.0)

## 2021-07-28 NOTE — Patient Instructions (Addendum)
°  °  Description   Spoke with pt, he is scheduled for VT ablation on 07/30/21 he has been instructed by MD at [facility] to hold Warfarin starting 07/27/21 prior to procedure on 07/30/21.  Advised pt to resume Warfarin post procedure once MD advises safe to do so.  Resume previous dosage regimen 1/2 tablet daily except 1 tablet on Monday, Wednesday, and Friday.  Recheck INR in 1 week post procedure.  AMIO DOSE INCREASED TO 400MG  ON SAT & SUN ALL OTHER DAYS STAY AT 200MG .  Pt's cell phone [phone] in case difficulty reaching pt on home phone.

## 2021-07-28 NOTE — Progress Notes (Signed)
EPIC Encounter for ICM Monitoring  Patient Name: Austin [name] is a 81 y.o. male Date: 07/28/2021 Primary Care Physican: Lajean Manes, MD Primary Cardiologist: Smith/McLean Electrophysiologist: Lovena Le 06/02/2021 Weight: 192.5 lbs 07/28/2021 Weight:  198.4 lbs  Since 24-Jun-2021 AT/AF 48 Time in AT/AF 13.0 hr/day (54.0%) Longest AT/AF 5 days          Spoke with patient and heart failure questions reviewed.  Pt symptomatic for fluid accumulation with SOB, 6-7 weight gain in last few weeks with 2 pounds since last night, and some lower extremity swelling.  Reports feeling reasonable well at this time and voices no complaints.  Scheduled for VT ablation 1/5 at [facility].    Optivol Thoracic impedance suggesting possible fluid accumulation starting 07/18/2021 and trending closer to baseline.    Prescribed:  Furosemide 20 mg Take 2 tablets (40 mg total) by mouth every morning AND 1 tablet (20 mg total) every evening.   Labs: 06/12/2021 Creatinine 1.38, BUN 24, Potassium 4.5, Sodium 136, GFR 52 05/11/2021 Creatinine 1.22, BUN 19, Potassium 4.2, Sodium 137, GFR >60 03/11/2021 Creatinine 1.23, BUN 23, Potassium 4.4, Sodium 137, GFR >60 02/26/2021 Creatinine 1.25, BUN 23, Potassium 4.0, Sodium 137, GFR 59 A complete set of results can be found in Results Review.   Recommendations:  Recommendations will be given at [facility] HF clinic 1/4 appointment.    Follow-up plan: ICM clinic phone appointment on 08/11/2021 to recheck fluid levels.   91 day device clinic remote transmission 09/23/2021.      EP/Cardiology Office Visits:  07/29/2021 with [facility].   Copy of ICM check sent to Dr. Lovena Le.     3 month ICM trend: 07/28/2021.    12-14 Month ICM trend:     [name], RN 07/28/2021 4:23 PM

## 2021-07-29 ENCOUNTER — Other Ambulatory Visit: Payer: Self-pay

## 2021-07-29 ENCOUNTER — Ambulatory Visit (HOSPITAL_COMMUNITY)
Admission: RE | Admit: 2021-07-29 | Discharge: 2021-07-29 | Disposition: A | Discharge status: Home / Self Care | Payer: Medicare Other | Source: Ambulatory Visit | Attending: Family Medicine | Admitting: Family Medicine

## 2021-07-29 ENCOUNTER — Telehealth: Payer: Self-pay

## 2021-07-29 ENCOUNTER — Encounter (HOSPITAL_COMMUNITY): Payer: Self-pay

## 2021-07-29 ENCOUNTER — Other Ambulatory Visit: Payer: Medicare Other | Admitting: *Deleted

## 2021-07-29 VITALS — BP 112/70 | HR 76 | Wt 202.6 lb

## 2021-07-29 DIAGNOSIS — Z950 Presence of cardiac pacemaker: Secondary | ICD-10-CM | POA: Diagnosis not present

## 2021-07-29 DIAGNOSIS — Z95810 Presence of automatic (implantable) cardiac defibrillator: Secondary | ICD-10-CM

## 2021-07-29 DIAGNOSIS — E039 Hypothyroidism, unspecified: Secondary | ICD-10-CM | POA: Insufficient documentation

## 2021-07-29 DIAGNOSIS — I472 Ventricular tachycardia, unspecified: Secondary | ICD-10-CM

## 2021-07-29 DIAGNOSIS — Z7901 Long term (current) use of anticoagulants: Secondary | ICD-10-CM | POA: Insufficient documentation

## 2021-07-29 DIAGNOSIS — I4819 Other persistent atrial fibrillation: Secondary | ICD-10-CM

## 2021-07-29 DIAGNOSIS — Z952 Presence of prosthetic heart valve: Secondary | ICD-10-CM | POA: Diagnosis not present

## 2021-07-29 DIAGNOSIS — I442 Atrioventricular block, complete: Secondary | ICD-10-CM | POA: Diagnosis not present

## 2021-07-29 DIAGNOSIS — I959 Hypotension, unspecified: Secondary | ICD-10-CM | POA: Insufficient documentation

## 2021-07-29 DIAGNOSIS — Z79899 Other long term (current) drug therapy: Secondary | ICD-10-CM | POA: Insufficient documentation

## 2021-07-29 DIAGNOSIS — I5022 Chronic systolic (congestive) heart failure: Secondary | ICD-10-CM | POA: Diagnosis not present

## 2021-07-29 LAB — PROTIME-INR
INR: 2.8 — ABNORMAL HIGH (ref 0.9–1.2)
Prothrombin Time: 27.4 s — ABNORMAL HIGH (ref 9.1–12.0)

## 2021-07-29 NOTE — Patient Instructions (Signed)
Medication Changes:  Increase lasix to 60mg  am them 40 mg Pm for today. Then return back to 40/20. If swelling /weight still up after ablation then its ok to increase lasix back to 60/40 for 3 days.  Lab Work:  No lab work today  Testing/Procedures:  none  Referrals:  none  Special Instructions // Education:  Please wear compression hose  Follow-Up in: 3 months with Dr Aundra Dubin  At the [facility], you and your health needs are our priority. We have a designated team specialized in the treatment of Heart Failure. This Care Team includes your primary Heart Failure Specialized Cardiologist (physician), Advanced Practice Providers (APPs- Physician Assistants and Nurse Practitioners), and Pharmacist who all work together to provide you with the care you need, when you need it.   You may see any of the following providers on your designated Care Team at your next follow up:  Dr Glori Bickers Dr Haynes Kerns, NP Lyda Jester, [place] [facility] [place], [place] Audry Riles, PharmD   Please be sure to bring in all your medications bottles to every appointment.   Need to Contact us:  If you have any questions or concerns before your next appointment please send [place] a message through [place] or call our office at [phone].    TO LEAVE A MESSAGE FOR THE NURSE SELECT OPTION 2, PLEASE LEAVE A MESSAGE INCLUDING: YOUR NAME DATE OF BIRTH CALL BACK NUMBER REASON FOR CALL**this is important as we prioritize the call backs  YOU WILL RECEIVE A CALL BACK THE SAME DAY AS LONG AS YOU CALL BEFORE 4:00 PM

## 2021-07-29 NOTE — Telephone Encounter (Signed)
Pt called INR 4.7 on 07/28/21, pt is scheduled for VT ablation at [facility] on 07/30/21.  [facility] MD is requesting pt have STAT INR done since INR elevated 07/28/21 to verify results.  Placed STAT INR order in Epic, scheduled appt for pt in lab.  Will await STAT INR results and then call pt to notify.

## 2021-07-29 NOTE — Telephone Encounter (Signed)
INR 2.8 today 07/29/21 in office by venipuncture, spoke with Anderson Malta Dr Hranitzky's office at [facility] gave verbal INR results over phone and also faxed these results [phone] to her via Epic.    Attempted to call pt and notify of INR results.  LMOM TCB.

## 2021-07-29 NOTE — Progress Notes (Signed)
PCP: Lajean Manes, MD Cardiology: Dr. Tamala Julian HF Cardiology: Dr. Aundra Dubin EP: Dr. Lovena Le  81 y.o. with history of mechanical mitral valve s/p MV endocarditis, VT, nonischemic cardiomyopathy, complete heart block was referred by Dr. Tamala Julian for evaluation of CHF.  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 He subsequently developed a presumed nonischemic cardiomyopathy.  This was complicated by VT, he had a VT ablation in 2017 and had a Medtronic CRT-D device implanted. This had to be removed due to infection in 11/12 and was reimplanted in 1/13. Last echo in 5/22 showed EF 30% with normal RV, mechanical MV stable with mean gradient 3 mmHg.  He has been in atrial fibrillation persistently since late 2021.  He is on amiodarone and has been cardioverted in the past. His LV lead is not functional.  He has complete heart block and is persistently RV pacing.   Had [place] 02/26/21 RA 4, PA 32/12, PCWP 11, CO 4.6, CI 2.2. Normal filling pressures and slightly low cardiac output.   Digoxin was stopped due to elevated level.   He saw Dr. Lovena Le in 9/22 and was in slow VT in the office.  He was paced out of VT and amiodarone was increased to 200 mg daily.   He saw Dr. Lovena Le, and placement of a left bundle or LV lead was decided to be too risky a procedure.  He was referred to Dr. Oneida Alar with the hope that he could have a VT ablation and get off amiodarone, which is likely playing a role in his symptoms.     Today she returns for HF follow up with his wife. He is more SOB and has more leg weakness x 1 week, some chest tightness with exertion. No dizziness and no falls.  Denies  CP, dizziness, palpitations or PND/Orthopnea. Appetite ok. No fever or chills. Weight at home 196 pounds, up about 4-5 lbs. Taking all medications. Flew to [place] over holidays to visit family and indulged.   Medtronic device interrogation: Fluid index >threshold but impedance trending back up, back in  atrial fibrillation , no shocks, 1 hour daily activity (personally reviewed).  ECG (personally reviewed): none ordered today.  Labs (7/22): K 4.6, creatinine 1.3 Labs (8/22): K 4.4, creatinine 1.23 Labs (10/22): K 3.9, creatinine 1.36 Labs (12/22): K 4.2, creatinine 1.39  PMH: 1. 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 2. H/o VT: VT ablation in 2017.  3. Atrial fibrillation/atypical flutter: Persistent since 2021.  Failed amiodarone.  4. PAD: Tibial occlusive disease.  5. 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  6. Hypothyroidism.  7. Chronic systolic CHF: Presumed nonischemic cardiomyopathy.  Medtronic CRT-D device, LV lead is nonfunctional.  - Echo (2012): EF 45% - Echo (1/17): EF 30-35% - Echo (6/21): EF 15-20% - Echo (11/21): EF 30-35%  - Echo (5/22): EF 30%, normal RV, mechanical mitral valve mean gradient 3 mmHg.  - RHC (8/22): RA mean 4, RV 33/4,PA 32/12 mean 21, PCWP mean 11, Oxygen saturations:PA 71% AO 98%, Cardiac Output (Fick) 4.66 Cardiac Index (Fick) 2.18  Social History   Socioeconomic History   Marital status: Married    Spouse name: Not on file   Number of children: 1   Years of education: 18   Highest education level: Master's degree (e.g., MA, MS, MEng, MEd, MSW, MBA)  Occupational History   Occupation: retired from [employer], manufacturing systems  Tobacco Use   Smoking status: Never   Smokeless tobacco: Never  Media planner  Vaping Use: Never used  Substance and Sexual Activity   Alcohol use: Yes    Alcohol/week: 0.0 standard drinks    Comment: occasionally   Drug use: No   Sexual activity: Not on file  Other Topics Concern   Not on file  Social History Narrative   Lives with wife in a one story home.  Has one daughter.  Retired.  Education: Masters.   Social Determinants of Health   Financial Resource Strain: Not on file  Food Insecurity: Not on file  Transportation Needs: Not on file  Physical Activity: Not on file  Stress: Not  on file  Social Connections: Not on file  Intimate Partner Violence: Not on file   Family History  Problem Relation Age of Onset   Heart disease Mother    Heart failure Mother    Heart disease Father    Heart failure Father    ROS: All systems reviewed and negative except as per HPI.   Current Outpatient Medications  Medication Sig Dispense Refill   acetaminophen (TYLENOL) 500 MG tablet Take 1,000 mg by mouth every 6 (six) hours as needed (pain).     amiodarone (PACERONE) 200 MG tablet Take one tablet by mouth Monday through Friday.  Take TWO tablets by mouth Saturday and Sunday. 108 tablet 3   furosemide (LASIX) 20 MG tablet Take 2 tablets (40 mg total) by mouth every morning AND 1 tablet (20 mg total) every evening. 90 tablet 6   levothyroxine (SYNTHROID) 50 MCG tablet Take 1 tablet (50 mcg total) by mouth daily before breakfast. 90 tablet 2   melatonin 5 MG TABS Take 5 mg by mouth at bedtime.     metoprolol succinate (TOPROL-XL) 25 MG 24 hr tablet Take 0.5 tablets (12.5 mg total) by mouth daily. Take with or immediately following a meal. 45 tablet 3   Multiple Vitamin (MULTIVITAMIN WITH MINERALS) TABS tablet Take 1 tablet by mouth in the morning.     nitroGLYCERIN (NITROSTAT) 0.4 MG SL tablet Place 1 tablet (0.4 mg total) under the tongue every 5 (five) minutes x 3 doses as needed for chest pain. 25 tablet 2   valsartan (DIOVAN) 40 MG tablet Take 0.5 tablets (20 mg total) by mouth 2 (two) times daily. 30 tablet 11   Vericiguat (VERQUVO) 5 MG TABS Take 1 tablet (5 mg total) by mouth daily. 30 tablet 6   warfarin (COUMADIN) 5 MG tablet TAKE AS DIRECTED BY THE COUMADIN CLINIC 80 tablet 1   amoxicillin (AMOXIL) 500 MG capsule TAKE 4 CAPSULES BY MOUTH 30 MINUTES PRIOR TO DENTAL WORK (Patient not taking: Reported on 07/29/2021) 4 capsule 2   No current facility-administered medications for this encounter.   BP 112/70    Pulse 76    Wt 91.9 kg (202 lb 9.6 oz)    SpO2 93%    BMI 26.01 kg/m    Wt Readings from Last 3 Encounters:  07/29/21 91.9 kg (202 lb 9.6 oz)  06/12/21 89.8 kg (198 lb)  05/22/21 90.4 kg (199 lb 6.4 oz)   General:  NAD. No resp difficulty HEENT: Normal Neck: Supple. JVP 78-9. Carotids 2+ bilat; no bruits. No lymphadenopathy or thryomegaly appreciated. Cor: PMI nondisplaced. Irregular rate & rhythm. No rubs, gallops or murmurs.+ mechanical S1. Lungs: Clear Abdomen: Soft, nontender, nondistended. No hepatosplenomegaly. No bruits or masses. Good bowel sounds. Extremities: No cyanosis, clubbing, rash, 1-2+ BLE pre-tibial edema Neuro: Alert & oriented x 3, cranial nerves grossly intact. Moves all 4 extremities  w/o difficulty. Affect pleasant.  Assessment/Plan: 1. Chronic systolic CHF: Long-standing, suspected nonischemic cardiomyopathy.  Last echo in 5/22 with EF 30%, normal RV, mechanical mitral valve mean gradient 3 mmHg.  He has a Medtronic CRT-D device present, but the LV lead is not functional.  He has complete heart block, so is RV paced chronically.  He had been in atrial fibrillation for about a year but recently has been back in NSR.  Had [place] on 02/26/21 with normal filling pressures and mildly low cardiac output.  NYHA III-early IIIb. He is at least mildly volume overloaded today, weight up about 5 lbs on home scale, likely due to dietary indiscretion over the holidays. - Increase Lasix to 60 mg AM/40 mg PM with KCl 20 today. Labs from 06/25/21 reviewed and look OK, SCr 1.39, K 4.2 - Place compression hose. - Discussed limiting salt intake and fluids to <2L/day. - If swelling and weight do not come back down, OK to keep Lasix at 60 q AM/40 q PM x 3 days, then back to 40 q AM/20 q PM. - Limited ability to use GDMT due to low BP.     - Off digoxin due to persistently elevated level.  - With orthostatic symptoms, he is now off Entresto and on valsartan 20 mg bid.   - Continue Toprol XL 12.5 mg daily at bedtime.  - Continue Verquvo 5 mg daily.   - The patient  has CHB and is chronically RV pacing as his LV lead is nonfunctional.  This is not ideal and creates dyssynchrony.  Remains markedly symptomatic with limited ability to titrate meds. He has seen Dr. Lovena Le, it would be possible to place left bundle lead but procedure would be complicated.  After further discussion with Dr. Lovena Le, it was decided that risk would be too high and LB lead will not be attempted.   2. Atrial fibrillation: Persistent since 2021, recently in NSR for a couple weeks. However, now back in atrial fibrillation. - Previously recommend against repeat DCCV as unlikely to hold NSR. - Continue Warfarin.  3. Mechanical mitral valve: Valve looked normal on 5/22 echo.  - Continue warfarin, INR goal 3-3.5. INR yesterday 4.7. Will forward this to Dr. Ricardo Jericho office for review prior to ablation (see below). - He has not been on ASA.  4. VT: History of VT and VT ablation in 2017.  MDT ICD.  He is on amiodarone, this was increased to 200 mg daily recently with recurrent VT.  I wonder if amiodarone is causing some of his low output-type symptoms like fatigue and nausea.  - Continue current amiodarone, LFTs 11/22 ok.  He is on Levoxyl for hypothyroidism.  He will need a regular eye exam.  - He has been referred to Dr. Oneida Alar for attempt at redo VT ablation.  This may allow him to stop amiodarone. This is scheduled for tomorrow, 07/30/21. 5. Complete heart block: Per notes, patient is pacer dependent due to CHB.  Currently RV pacing chronically.  - See above, upgrade to CRT thought to be too risky.    Followup in 4 weeks with APP to reassess volume (EKG and ReDs) and 3 months with Dr. Loistine Simas, FNP 07/29/2021

## 2021-07-29 NOTE — Telephone Encounter (Signed)
Pt called back, advised INR 2.8 at lab today, [facility] aware results faxed pt should be good to go for VT ablation tomorrow at [facility].

## 2021-07-30 DIAGNOSIS — Z950 Presence of cardiac pacemaker: Secondary | ICD-10-CM | POA: Diagnosis not present

## 2021-07-30 DIAGNOSIS — I472 Ventricular tachycardia, unspecified: Secondary | ICD-10-CM | POA: Diagnosis not present

## 2021-07-30 DIAGNOSIS — I5022 Chronic systolic (congestive) heart failure: Secondary | ICD-10-CM | POA: Diagnosis not present

## 2021-07-30 DIAGNOSIS — R9431 Abnormal electrocardiogram [ECG] [EKG]: Secondary | ICD-10-CM | POA: Diagnosis not present

## 2021-07-30 DIAGNOSIS — I428 Other cardiomyopathies: Secondary | ICD-10-CM | POA: Diagnosis not present

## 2021-07-30 DIAGNOSIS — I110 Hypertensive heart disease with heart failure: Secondary | ICD-10-CM | POA: Diagnosis not present

## 2021-07-30 DIAGNOSIS — G4733 Obstructive sleep apnea (adult) (pediatric): Secondary | ICD-10-CM | POA: Diagnosis not present

## 2021-07-30 DIAGNOSIS — I4891 Unspecified atrial fibrillation: Secondary | ICD-10-CM | POA: Diagnosis not present

## 2021-07-30 HISTORY — PX: V-TACH ABLATION: SHX6186

## 2021-07-31 DIAGNOSIS — Z950 Presence of cardiac pacemaker: Secondary | ICD-10-CM | POA: Diagnosis not present

## 2021-07-31 DIAGNOSIS — I110 Hypertensive heart disease with heart failure: Secondary | ICD-10-CM | POA: Diagnosis not present

## 2021-07-31 DIAGNOSIS — I472 Ventricular tachycardia, unspecified: Secondary | ICD-10-CM | POA: Diagnosis not present

## 2021-07-31 DIAGNOSIS — I470 Re-entry ventricular arrhythmia: Secondary | ICD-10-CM | POA: Diagnosis not present

## 2021-07-31 DIAGNOSIS — R9431 Abnormal electrocardiogram [ECG] [EKG]: Secondary | ICD-10-CM | POA: Diagnosis not present

## 2021-07-31 DIAGNOSIS — I428 Other cardiomyopathies: Secondary | ICD-10-CM | POA: Diagnosis not present

## 2021-07-31 DIAGNOSIS — G4733 Obstructive sleep apnea (adult) (pediatric): Secondary | ICD-10-CM | POA: Diagnosis not present

## 2021-07-31 DIAGNOSIS — I5022 Chronic systolic (congestive) heart failure: Secondary | ICD-10-CM | POA: Diagnosis not present

## 2021-07-31 DIAGNOSIS — I4891 Unspecified atrial fibrillation: Secondary | ICD-10-CM | POA: Diagnosis not present

## 2021-08-06 ENCOUNTER — Ambulatory Visit (INDEPENDENT_AMBULATORY_CARE_PROVIDER_SITE_OTHER): Payer: Medicare Other

## 2021-08-06 DIAGNOSIS — I4819 Other persistent atrial fibrillation: Secondary | ICD-10-CM

## 2021-08-06 DIAGNOSIS — Z5181 Encounter for therapeutic drug level monitoring: Secondary | ICD-10-CM | POA: Diagnosis not present

## 2021-08-06 LAB — POCT INR: INR: 2.6 (ref 2.0–3.0)

## 2021-08-06 NOTE — Patient Instructions (Signed)
Description   Spoke with pt and instructed to take 1 tablet today and then resume taking 1/2 tablet daily except 1 tablet on Monday, Wednesday, and Friday. Recheck INR in 1 week.     AMIO DOSE INCREASED TO 400MG  ON SAT & SUN ALL OTHER DAYS STAY AT 200MG .   Pt's cell phone [phone] in case difficulty reaching pt on home phone.

## 2021-08-11 ENCOUNTER — Other Ambulatory Visit: Payer: Self-pay | Admitting: Cardiology

## 2021-08-11 ENCOUNTER — Ambulatory Visit (INDEPENDENT_AMBULATORY_CARE_PROVIDER_SITE_OTHER): Payer: Medicare Other

## 2021-08-11 DIAGNOSIS — I5022 Chronic systolic (congestive) heart failure: Secondary | ICD-10-CM

## 2021-08-11 DIAGNOSIS — Z95810 Presence of automatic (implantable) cardiac defibrillator: Secondary | ICD-10-CM

## 2021-08-12 NOTE — Progress Notes (Signed)
EPIC Encounter for ICM Monitoring  Patient Name: Austin [name] is a 81 y.o. male Date: 08/12/2021 Primary Care Physican: Lajean Manes, MD Primary Cardiologist: [place] Electrophysiologist: Lovena Le 06/02/2021 Weight: 192.5 lbs 07/28/2021 Weight:  198.4 lbs 08/12/2021 Weight: 194.7 lbs   Clinical Status (30-Jul-2021 to 11-Aug-2021) AT/AF 1 episode Time in AT/AF 24.0 hr/day (100.0%)          Spoke with patient and heart failure questions reviewed.  Pt symptomatic for fluid accumulation.      Optivol Thoracic impedance suggesting fluid levels returned to normal.    Prescribed:  Furosemide 20 mg Take 2 tablets (40 mg total) by mouth every morning AND 1 tablet (20 mg total) every evening.   Labs: 07/31/2021 Creatinine 1.15, BUN 15, Potassium 4.3, Sodium 139, GFR 65 07/30/2021 Creatinine 1.28, BUN 21, Potassium 3.7, Sodium 141, GFR 57 06/12/2021 Creatinine 1.38, BUN 24, Potassium 4.5, Sodium 136, GFR 52 05/11/2021 Creatinine 1.22, BUN 19, Potassium 4.2, Sodium 137, GFR >60 03/11/2021 Creatinine 1.23, BUN 23, Potassium 4.4, Sodium 137, GFR >60 02/26/2021 Creatinine 1.25, BUN 23, Potassium 4.0, Sodium 137, GFR 59 A complete set of results can be found in Results Review.   Recommendations:  No changes and encouraged to call if experiencing any fluid symptoms.   Follow-up plan: ICM clinic phone appointment on 08/31/2021.   91 day device clinic remote transmission 09/23/2021.      EP/Cardiology Office Visits:  08/26/2021 with [facility].   Copy of ICM check sent to Dr. Lovena Le.     3 month ICM trend: 08/11/2021.    12-14 Month ICM trend:     [name], RN 08/12/2021 3:48 PM

## 2021-08-13 ENCOUNTER — Ambulatory Visit (INDEPENDENT_AMBULATORY_CARE_PROVIDER_SITE_OTHER): Payer: Medicare Other | Admitting: *Deleted

## 2021-08-13 DIAGNOSIS — I4819 Other persistent atrial fibrillation: Secondary | ICD-10-CM | POA: Diagnosis not present

## 2021-08-13 DIAGNOSIS — Z5181 Encounter for therapeutic drug level monitoring: Secondary | ICD-10-CM | POA: Diagnosis not present

## 2021-08-13 LAB — POCT INR: INR: 3.1 — AB (ref 2.0–3.0)

## 2021-08-13 NOTE — Patient Instructions (Signed)
Description   Spoke with pt and instructed to continue taking 1/2 tablet daily except 1 tablet on Monday, Wednesday, and Friday. Recheck INR in 2 weeks.  AMIO DOSE INCREASED TO 400MG  ON SAT & SUN ALL OTHER DAYS STAY AT 200MG .   Pt's cell phone [phone] in case difficulty reaching pt on home phone.

## 2021-08-14 ENCOUNTER — Ambulatory Visit (HOSPITAL_COMMUNITY)
Admission: EM | Admit: 2021-08-14 | Discharge: 2021-08-14 | Disposition: A | Discharge status: Home / Self Care | Payer: Medicare Other | Attending: Internal Medicine | Admitting: Internal Medicine

## 2021-08-14 ENCOUNTER — Encounter (HOSPITAL_COMMUNITY): Payer: Self-pay | Admitting: Emergency Medicine

## 2021-08-14 ENCOUNTER — Other Ambulatory Visit (HOSPITAL_COMMUNITY): Payer: Self-pay

## 2021-08-14 ENCOUNTER — Other Ambulatory Visit: Payer: Self-pay

## 2021-08-14 ENCOUNTER — Telehealth (HOSPITAL_COMMUNITY): Payer: Self-pay | Admitting: Pharmacy Technician

## 2021-08-14 DIAGNOSIS — R300 Dysuria: Secondary | ICD-10-CM | POA: Diagnosis not present

## 2021-08-14 LAB — POCT URINALYSIS DIPSTICK, ED / UC
Bilirubin Urine: NEGATIVE
Glucose, UA: NEGATIVE mg/dL
Hgb urine dipstick: NEGATIVE
Ketones, ur: NEGATIVE mg/dL
Leukocytes,Ua: NEGATIVE
Nitrite: NEGATIVE
Protein, ur: NEGATIVE mg/dL
Specific Gravity, Urine: 1.015 (ref 1.005–1.030)
Urobilinogen, UA: 0.2 mg/dL (ref 0.0–1.0)
pH: 5.5 (ref 5.0–8.0)

## 2021-08-14 MED ORDER — AMOXICILLIN-POT CLAVULANATE 875-125 MG PO TABS
1.0000 | ORAL_TABLET | Freq: Two times a day (BID) | ORAL | 0 refills | Status: AC
Start: 2021-08-14 — End: 2021-08-21

## 2021-08-14 NOTE — ED Provider Notes (Signed)
[place]    CSN: [id number] Arrival date & time: 08/14/21  1551      History   Chief Complaint Chief Complaint  Patient presents with   Dysuria    HPI Austin [name] is a 81 y.o. male presents urgent care today with complaint of dysuria.  Patient states he had to have Foley catheter 2 weeks ago for procedure.  Felt fine after removal however has developed some burning after urination in the past 3 days.  Patient denies any abdominal pain, recent fever or chills, N/V/D, hematuria or difficulty starting stream.  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    cx by infection, explantation11/12 & reimplant 1/13   Conductive hearing loss    Intraspinal abscess    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   Nonischemic cardiomyopathy ([place])    Psychosexual dysfunction with inhibited sexual excitement    S/P mitral valve replacement    Syncope and collapse    Unspecified sleep apnea    last sleep study 11/07   Ventricular tachycardia    VT (ventricular tachycardia)     Patient Active Problem List   Diagnosis Date Noted   Chronic anticoagulation 10/14/2018   VT (ventricular tachycardia) 10/12/2018   Neuropathy 08/25/2018   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   Nonischemic cardiomyopathy ([place])    Postprocedural hypotension 01/10/2016   Subtherapeutic international normalized ratio (INR) 01/10/2016   S/P ablation of ventricular arrhythmia 01/10/2016   Hypothyroidism 10/27/2015   H/O mitral valve replacement with mechanical valve    Encounter for therapeutic drug monitoring 08/20/2013   Cardiac device, implant, or graft infection or inflammation ([place]) 06/28/2011   ICD (implantable cardioverter-defibrillator) in place 00/76/2263   Chronic systolic heart failure ([place]) 04/15/2009    ERECTILE DYSFUNCTION 11/27/2008   ABSCESS, SPINE, EPIDURAL 11/27/2008   HEARING LOSS 11/27/2008   Atrial fibrillation ([place]) 11/27/2008   SYNCOPE 11/27/2008   SLEEP APNEA 11/27/2008    Past Surgical History:  Procedure Laterality Date   BIV ICD GENERATOR CHANGEOUT N/A 05/04/2017   Procedure: BiV ICD Generator Changeout;  Surgeon: Evans Lance, MD;  Location: [facility];  Service: Cardiovascular;  Laterality: N/A;   CARDIAC CATHETERIZATION  04/24/2002   CARDIAC VALVE REPLACEMENT     CARDIOVERSION  03/09/2012   Procedure: CARDIOVERSION;  Surgeon: Evans Lance, MD;  Location: [facility];  Service: Cardiovascular;  Laterality: N/A;   CARDIOVERSION N/A 11/22/2013   Procedure: CARDIOVERSION;  Surgeon: Sanda Klein, MD;  Location: [facility];  Service: Cardiovascular;  Laterality: N/A;   dental implants     ELECTROPHYSIOLOGIC STUDY N/A 09/15/2015   Procedure: Stephanie Coup Ablation;  Surgeon: Evans Lance, MD;  Location: [facility];  Service: Cardiovascular;  Laterality: N/A;   Evacution of epidural lumbar epidural abscess  1999   INSERT / REPLACE / REMOVE PACEMAKER  11/2008   MITRAL VALVE REPLACEMENT     w #[id number] [facility]   PERMANENT PACEMAKER INSERTION N/A 08/05/2011   Procedure: PERMANENT PACEMAKER INSERTION;  Surgeon: Evans Lance, MD;  Location: [facility];  Service: Cardiovascular;  Laterality: N/A;   RIGHT HEART CATH N/A 02/26/2021   Procedure: RIGHT HEART CATH;  Surgeon: Larey Dresser, MD;  Location: [facility];  Service: Cardiovascular;  Laterality: N/A;   THYROIDECTOMY  TONSILLECTOMY     VALVE REPLACEMENT  2000       Home Medications    Prior to Admission medications   Medication Sig Start Date End Date Taking? Authorizing Provider  amiodarone (PACERONE) 200 MG tablet Take one tablet by mouth Monday through Friday.  Take TWO tablets by mouth Saturday and Sunday. 04/17/21  Yes Evans Lance, MD  amoxicillin-clavulanate (AUGMENTIN) 875-125 MG  tablet Take 1 tablet by mouth 2 (two) times daily for 7 days. 08/14/21 08/21/21 Yes Rudolpho Sevin, NP  furosemide (LASIX) 20 MG tablet Take 2 tablets (40 mg total) by mouth every morning AND 1 tablet (20 mg total) every evening. 06/12/21  Yes Larey Dresser, MD  levothyroxine (SYNTHROID) 50 MCG tablet Take 1 tablet (50 mcg total) by mouth daily before breakfast. 02/19/21  Yes Evans Lance, MD  melatonin 5 MG TABS Take 5 mg by mouth at bedtime.   Yes [provider]  metoprolol succinate (TOPROL-XL) 25 MG 24 hr tablet Take 0.5 tablets (12.5 mg total) by mouth daily. Take with or immediately following a meal. 12/24/20 05/11/22 Yes Baldwin Jamaica, PA-C  Multiple Vitamin (MULTIVITAMIN WITH MINERALS) TABS tablet Take 1 tablet by mouth in the morning.   Yes [provider]  valsartan (DIOVAN) 40 MG tablet Take 0.5 tablets (20 mg total) by mouth 2 (two) times daily. 05/11/21  Yes Larey Dresser, MD  Vericiguat (VERQUVO) 5 MG TABS Take 1 tablet (5 mg total) by mouth daily. 07/10/21  Yes Larey Dresser, MD  warfarin (COUMADIN) 5 MG tablet TAKE AS DIRECTED BY THE COUMADIN CLINIC 05/28/21  Yes Evans Lance, MD  acetaminophen (TYLENOL) 500 MG tablet Take 1,000 mg by mouth every 6 (six) hours as needed (pain).    [provider]  amoxicillin (AMOXIL) 500 MG capsule TAKE 4 CAPSULES BY MOUTH 30 MINUTES PRIOR TO DENTAL WORK Patient not taking: Reported on 07/29/2021 02/19/21   Evans Lance, MD  nitroGLYCERIN (NITROSTAT) 0.4 MG SL tablet Place 1 tablet (0.4 mg total) under the tongue every 5 (five) minutes x 3 doses as needed for chest pain. 10/14/18   Erlene Quan, PA-C    Family History Family History  Problem Relation Age of Onset   Heart disease Mother    Heart failure Mother    Heart disease Father    Heart failure Father     Social History Social History   Tobacco Use   Smoking status: Never   Smokeless tobacco: Never  Vaping Use   Vaping Use: Never used   Substance Use Topics   Alcohol use: Yes    Alcohol/week: 0.0 standard drinks    Comment: occasionally   Drug use: No     Allergies   Patient has no known allergies.   Review of Systems As stated in HPI otherwise negative   Physical Exam Triage Vital Signs ED Triage Vitals [08/14/21 1604]  Enc Vitals Group     BP 107/66     Pulse Rate 76     Resp 16     Temp 97.8 F (36.6 C)     Temp Source Oral     SpO2 93 %     Weight      Height      Head Circumference      Peak Flow      Pain Score      Pain Loc      Pain Edu?  Excl. in [place]?    No data found.  Updated Vital Signs BP 107/66 (BP Location: Right Arm)    Pulse 76    Temp 97.8 F (36.6 C) (Oral)    Resp 16    SpO2 93%   Visual Acuity Right Eye Distance:   Left Eye Distance:   Bilateral Distance:    Right Eye Near:   Left Eye Near:    Bilateral Near:     Physical Exam Constitutional:      General: He is not in acute distress.    Appearance: Normal appearance. He is not ill-appearing or toxic-appearing.  HENT:     Mouth/Throat:     Mouth: Mucous membranes are moist.  Abdominal:     General: Bowel sounds are normal.     Palpations: Abdomen is soft.  Skin:    General: Skin is warm and dry.  Neurological:     Mental Status: He is alert and oriented to person, place, and time.  Psychiatric:        Mood and Affect: Mood normal.        Behavior: Behavior normal.     UC Treatments / Results  Labs (all labs ordered are listed, but only abnormal results are displayed) Labs Reviewed  POCT URINALYSIS DIPSTICK, ED / UC    EKG   Radiology No results found.  Procedures Procedures (including critical care time)  Medications Ordered in UC Medications - No data to display  Initial Impression / Assessment and Plan / UC Course  I have reviewed the triage vital signs and the nursing notes.  Pertinent labs & imaging results that were available during my care of the patient were reviewed by me  and considered in my medical decision making (see chart for details).  Dysuria -Patient with recent catheterization for procedure with symptoms developing shortly thereafter.  Afebrile and nontoxic-appearing -No evidence of UTI with urine dipstick.  However, symptoms suggestive of acute cystitis versus possible prostatitis -We will treat empirically with Augmentin twice daily x7 days.  Avoiding Cipro as patient on Coumadin -Follow-up urine culture results -Patient instructed to follow-up for any worsening or persistent symptoms  Reviewed expections re: course of current medical issues. Questions answered. Outlined signs and symptoms indicating need for more acute intervention. Pt verbalized understanding. AVS given  Final Clinical Impressions(s) / UC Diagnoses   Final diagnoses:  Dysuria     Discharge Instructions      Your urine test today did not show any evidence of infection.  Because you are having symptoms, I will get a go ahead and treat you for an infection.  Please take antibiotic as prescribed.  I will send this to be cultured.  We will follow-up with you if any change in your antibiotic regimen is needed.  Otherwise, follow-up for worsening or persistent symptoms or should you develop any fever/chills.     ED Prescriptions     Medication Sig Dispense Auth. Provider   amoxicillin-clavulanate (AUGMENTIN) 875-125 MG tablet Take 1 tablet by mouth 2 (two) times daily for 7 days. 14 tablet Rudolpho Sevin, NP      PDMP not reviewed this encounter.   Rudolpho Sevin, NP 08/14/21 [phone]

## 2021-08-14 NOTE — Discharge Instructions (Addendum)
Your urine test today did not show any evidence of infection.  Because you are having symptoms, I will get a go ahead and treat you for an infection.  Please take antibiotic as prescribed.  I will send this to be cultured.  We will follow-up with you if any change in your antibiotic regimen is needed.  Otherwise, follow-up for worsening or persistent symptoms or should you develop any fever/chills.

## 2021-08-14 NOTE — ED Triage Notes (Signed)
Patient c/o dysuria x 3 days.   Patient denies change in urinary characteristics. Patient denies any testicular swelling.   Patient endorses " I had a heat ablation a few weeks ago, and they did a urinary catheter but no problems after that, just recently".   Patient hasn't taken any medications for symptoms.

## 2021-08-14 NOTE — Telephone Encounter (Signed)
Advanced Heart Failure Patient Advocate Encounter   The [employer] HF grant is currently accepting applicants who are on the wait list. Applied for grant, should have a determination in 4 business days.

## 2021-08-25 NOTE — Progress Notes (Signed)
PCP: Lajean Manes, MD Cardiology: Dr. Tamala Julian HF Cardiology: Dr. Aundra Dubin EP: Dr. Lovena Le  81 y.o. with history of mechanical mitral valve s/p MV endocarditis, VT, nonischemic cardiomyopathy, complete heart block was referred by Dr. Tamala Julian for evaluation of CHF.  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 He subsequently developed a presumed nonischemic cardiomyopathy.  This was complicated by VT, he had a VT ablation in 2017 and had a Medtronic CRT-D device implanted. This had to be removed due to infection in 11/12 and was reimplanted in 1/13. Last echo in 5/22 showed EF 30% with normal RV, mechanical MV stable with mean gradient 3 mmHg.  He has been in atrial fibrillation persistently since late 2021.  He is on amiodarone and has been cardioverted in the past. His LV lead is not functional.  He has complete heart block and is persistently RV pacing.   Had [place] 02/26/21 RA 4, PA 32/12, PCWP 11, CO 4.6, CI 2.2. Normal filling pressures and slightly low cardiac output.   Digoxin was stopped due to elevated level.   He saw Dr. Lovena Le in 9/22 and was in slow VT in the office.  He was paced out of VT and amiodarone was increased to 200 mg daily.   He saw Dr. Lovena Le, and placement of a left bundle or LV lead was decided to be too risky a procedure.  He was referred to Dr. Oneida Alar with the hope that he could have a VT ablation and get off amiodarone, which is likely playing a role in his symptoms.     S/p VT ablation with Dr. Oneida Alar 1/23.  Today she returns for HF follow up with his wife. He is more SOB and has more leg weakness x 1 week, some chest tightness with exertion. No dizziness and no falls.  Denies  CP, dizziness, palpitations or PND/Orthopnea. Appetite ok. No fever or chills. Weight at home 196 pounds, up about 4-5 lbs. Taking all medications. Flew to [place] over holidays to visit family and indulged.  Today he returns for HF follow up with his wife. Fluid  has been up and down, he takes Lasix 80 daily for a couple days if he thinks he has fluid. He remains SOB with activity, but feels some better and stronger recently. Has had a few low BP readings, systolic in 54M. He has dizziness in afternoons, improves with rest and Tylenol. Denies palpitations, abnormal bleeding, CP, or PND/Orthopnea. Appetite ok. No fever or chills. Weight at home 193-196 pounds. Taking all medications. He had his VT ablation with Dr. Oneida Alar. Has follow up soon.   Medtronic device interrogation: OptiVol down, thoracic impedence stable, in atrial fibrillation, no shocks, 1 hour daily activity (personally reviewed).  ECG (personally reviewed): none ordered today.  Labs (7/22): K 4.6, creatinine 1.3 Labs (8/22): K 4.4, creatinine 1.23 Labs (10/22): K 3.9, creatinine 1.36 Labs (12/22): K 4.2, creatinine 1.39 Labs (1/23): K 4.3, creatinine 1.15  PMH: 1. 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 2. H/o VT: VT ablation in 2017.  3. Atrial fibrillation/atypical flutter: Persistent since 2021.  Failed amiodarone.  4. PAD: Tibial occlusive disease.  5. 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  6. Hypothyroidism.  7. Chronic systolic CHF: Presumed nonischemic cardiomyopathy.  Medtronic CRT-D device, LV lead is nonfunctional.  - Echo (2012): EF 45% - Echo (1/17): EF 30-35% - Echo (6/21): EF 15-20% - Echo (11/21): EF 30-35%  - Echo (5/22): EF 30%, normal RV, mechanical mitral  valve mean gradient 3 mmHg.  - RHC (8/22): RA mean 4, RV 33/4,PA 32/12 mean 21, PCWP mean 11, Oxygen saturations:PA 71% AO 98%, Cardiac Output (Fick) 4.66 Cardiac Index (Fick) 2.18  Social History   Socioeconomic History   Marital status: Married    Spouse name: Not on file   Number of children: 1   Years of education: 18   Highest education level: Master's degree (e.g., MA, MS, MEng, MEd, MSW, MBA)  Occupational History   Occupation: retired from [employer], manufacturing systems   Tobacco Use   Smoking status: Never   Smokeless tobacco: Never  Vaping Use   Vaping Use: Never used  Substance and Sexual Activity   Alcohol use: Yes    Alcohol/week: 0.0 standard drinks    Comment: occasionally   Drug use: No   Sexual activity: Not on file  Other Topics Concern   Not on file  Social History Narrative   Lives with wife in a one story home.  Has one daughter.  Retired.  Education: Masters.   Social Determinants of Health   Financial Resource Strain: Not on file  Food Insecurity: Not on file  Transportation Needs: Not on file  Physical Activity: Not on file  Stress: Not on file  Social Connections: Not on file  Intimate Partner Violence: Not on file   Family History  Problem Relation Age of Onset   Heart disease Mother    Heart failure Mother    Heart disease Father    Heart failure Father    ROS: All systems reviewed and negative except as per HPI.   Current Outpatient Medications  Medication Sig Dispense Refill   acetaminophen (TYLENOL) 500 MG tablet Take 1,000 mg by mouth every 6 (six) hours as needed (pain).     amiodarone (PACERONE) 200 MG tablet Take 200 mg by mouth daily.     amoxicillin (AMOXIL) 500 MG capsule TAKE 4 CAPSULES BY MOUTH 30 MINUTES PRIOR TO DENTAL WORK 4 capsule 2   furosemide (LASIX) 20 MG tablet Take 2 tablets (40 mg total) by mouth every morning AND 1 tablet (20 mg total) every evening. 90 tablet 6   levothyroxine (SYNTHROID) 50 MCG tablet Take 1 tablet (50 mcg total) by mouth daily before breakfast. 90 tablet 2   melatonin 5 MG TABS Take 5 mg by mouth at bedtime.     metoprolol succinate (TOPROL-XL) 25 MG 24 hr tablet Take 0.5 tablets (12.5 mg total) by mouth daily. Take with or immediately following a meal. 45 tablet 3   Multiple Vitamin (MULTIVITAMIN WITH MINERALS) TABS tablet Take 1 tablet by mouth in the morning.     nitroGLYCERIN (NITROSTAT) 0.4 MG SL tablet Place 1 tablet (0.4 mg total) under the tongue every 5 (five)  minutes x 3 doses as needed for chest pain. 25 tablet 2   valsartan (DIOVAN) 40 MG tablet Take 0.5 tablets (20 mg total) by mouth 2 (two) times daily. 30 tablet 11   Vericiguat (VERQUVO) 5 MG TABS Take 1 tablet (5 mg total) by mouth daily. 30 tablet 6   warfarin (COUMADIN) 5 MG tablet TAKE AS DIRECTED BY THE COUMADIN CLINIC 80 tablet 1   No current facility-administered medications for this encounter.   BP 102/66    Pulse 84    Wt 90.7 kg (200 lb)    SpO2 98%    BMI 25.68 kg/m   Wt Readings from Last 3 Encounters:  08/26/21 90.7 kg (200 lb)  07/29/21 91.9 kg (202  lb 9.6 oz)  06/12/21 89.8 kg (198 lb)   General:  NAD. No resp difficulty HEENT: Hoarse Neck: Supple. No JVD. Carotids 2+ bilat; no bruits. No lymphadenopathy or thryomegaly appreciated. Cor: PMI nondisplaced. Regular rate & rhythm. No rubs, gallops or murmurs, mechanical S1 Lungs: Clear Abdomen: Soft, nontender, nondistended. No hepatosplenomegaly. No bruits or masses. Good bowel sounds. Extremities: No cyanosis, clubbing, rash, trace BLE edema, compression hose on Neuro: Alert & oriented x 3, cranial nerves grossly intact. Moves all 4 extremities w/o difficulty. Affect pleasant.  Assessment/Plan: 1. Chronic systolic CHF: Long-standing, suspected nonischemic cardiomyopathy.  Last echo in 5/22 with EF 30%, normal RV, mechanical mitral valve mean gradient 3 mmHg.  He has a Medtronic CRT-D device present, but the LV lead is not functional.  He has complete heart block, so is RV paced chronically.  He had been in atrial fibrillation for about a year but recently had a short period of NSR.  Had [place] on 02/26/21 with normal filling pressures and mildly low cardiac output.  Back in AF 12/22, rate controlled. Better NYHA II-early III. He is not volume overloaded today on exam or by OptiVol. - Continue Lasix 40 mg AM/20 mg PM. BMET today. - Discussed limiting salt intake and fluids to <2L/day. - Limited ability to use GDMT due to low BP.      - Off digoxin due to persistently elevated level.  - With orthostatic symptoms, he is now off Entresto and on valsartan 20 mg bid.   - Continue Toprol XL 12.5 mg daily at bedtime.  - Continue Verquvo 5 mg daily.   - The patient has CHB and is chronically RV pacing as his LV lead is nonfunctional.  This is not ideal and creates dyssynchrony.  Remains symptomatic with limited ability to titrate meds. He has seen Dr. Lovena Le, it would be possible to place left bundle lead but procedure would be complicated.  After further discussion with Dr. Lovena Le, it was decided that risk would be too high and LB lead will not be attempted.   - Update echo. 2. Atrial fibrillation: Persistent since 2021, recently in NSR for a couple weeks. However, now back in atrial fibrillation. - Previously recommend against repeat DCCV as unlikely to hold NSR. - Continue Warfarin.  3. Mechanical mitral valve: Valve looked normal on 5/22 echo.  - Continue warfarin, INR goal 3-3.5. INR 3.1 on 08/13/21 - He has not been on ASA.  4. VT: History of VT and VT ablation in 2017.  MDT ICD.  He is on amiodarone, this was increased to 200 mg daily recently with recurrent VT.  I wonder if amiodarone is causing some of his low output-type symptoms like fatigue and nausea.  - Continue current amiodarone, LFTs 11/22 ok, will check TSH.  He is on Levoxyl for hypothyroidism.  He will need a regular eye exam.  - Hopefully his VT ablation will allow [place] to stop amiodarone eventually. He has follow up with Dr. Oneida Alar in a few weeks. 5. Complete heart block: Per notes, patient is pacer dependent due to CHB.  Currently RV pacing chronically.  - See above, upgrade to CRT thought to be too risky.    Followup in 2 months with Dr. Aundra Dubin as scheduled w/ echo.  [place], [place] 08/26/2021

## 2021-08-26 ENCOUNTER — Encounter: Payer: Self-pay | Admitting: Family Medicine

## 2021-08-26 ENCOUNTER — Encounter (HOSPITAL_COMMUNITY): Payer: Self-pay

## 2021-08-26 ENCOUNTER — Other Ambulatory Visit: Payer: Self-pay

## 2021-08-26 ENCOUNTER — Ambulatory Visit (HOSPITAL_COMMUNITY)
Admission: RE | Admit: 2021-08-26 | Discharge: 2021-08-26 | Disposition: A | Discharge status: Home / Self Care | Payer: Medicare Other | Source: Ambulatory Visit | Attending: Family Medicine | Admitting: Family Medicine

## 2021-08-26 VITALS — BP 102/66 | HR 84 | Wt 200.0 lb

## 2021-08-26 DIAGNOSIS — I428 Other cardiomyopathies: Secondary | ICD-10-CM | POA: Diagnosis not present

## 2021-08-26 DIAGNOSIS — Z95810 Presence of automatic (implantable) cardiac defibrillator: Secondary | ICD-10-CM | POA: Insufficient documentation

## 2021-08-26 DIAGNOSIS — Z952 Presence of prosthetic heart valve: Secondary | ICD-10-CM

## 2021-08-26 DIAGNOSIS — Z79890 Hormone replacement therapy (postmenopausal): Secondary | ICD-10-CM | POA: Diagnosis not present

## 2021-08-26 DIAGNOSIS — E039 Hypothyroidism, unspecified: Secondary | ICD-10-CM | POA: Diagnosis not present

## 2021-08-26 DIAGNOSIS — Z8249 Family history of ischemic heart disease and other diseases of the circulatory system: Secondary | ICD-10-CM | POA: Insufficient documentation

## 2021-08-26 DIAGNOSIS — I472 Ventricular tachycardia, unspecified: Secondary | ICD-10-CM

## 2021-08-26 DIAGNOSIS — I5022 Chronic systolic (congestive) heart failure: Secondary | ICD-10-CM

## 2021-08-26 DIAGNOSIS — I442 Atrioventricular block, complete: Secondary | ICD-10-CM

## 2021-08-26 DIAGNOSIS — Z79899 Other long term (current) drug therapy: Secondary | ICD-10-CM | POA: Diagnosis not present

## 2021-08-26 DIAGNOSIS — I059 Rheumatic mitral valve disease, unspecified: Secondary | ICD-10-CM | POA: Diagnosis not present

## 2021-08-26 DIAGNOSIS — I959 Hypotension, unspecified: Secondary | ICD-10-CM | POA: Diagnosis not present

## 2021-08-26 DIAGNOSIS — Z7901 Long term (current) use of anticoagulants: Secondary | ICD-10-CM | POA: Insufficient documentation

## 2021-08-26 DIAGNOSIS — I4819 Other persistent atrial fibrillation: Secondary | ICD-10-CM

## 2021-08-26 LAB — TSH: TSH: 10.832 u[IU]/mL — ABNORMAL HIGH (ref 0.350–4.500)

## 2021-08-26 LAB — T4, FREE: Free T4: 1.12 ng/dL (ref 0.61–1.12)

## 2021-08-26 LAB — BASIC METABOLIC PANEL
Anion gap: 7 (ref 5–15)
BUN: 24 mg/dL — ABNORMAL HIGH (ref 8–23)
CO2: 27 mmol/L (ref 22–32)
Calcium: 8.8 mg/dL — ABNORMAL LOW (ref 8.9–10.3)
Chloride: 103 mmol/L (ref 98–111)
Creatinine, Ser: 1.54 mg/dL — ABNORMAL HIGH (ref 0.61–1.24)
GFR, Estimated: 46 mL/min — ABNORMAL LOW (ref >60)
Glucose, Bld: 92 mg/dL (ref 70–99)
Potassium: 4.1 mmol/L (ref 3.5–5.1)
Sodium: 137 mmol/L (ref 135–145)

## 2021-08-26 NOTE — Patient Instructions (Signed)
No changes in medications.  Labs done today, your results will be available in MyChart, we will contact you for abnormal readings.  Your physician has requested that you have an echocardiogram. Echocardiography is a painless test that uses sound waves to create images of your heart. It provides your doctor with information about the size and shape of your heart and how well your hearts chambers and valves are working. This procedure takes approximately one hour. There are no restrictions for this procedure.   Your physician recommends that you schedule a follow-up appointment in: April with Dr. Aundra Dubin.  If you have any questions or concerns before your next appointment please send [place] a message through [place] or call our office at [phone].    TO LEAVE A MESSAGE FOR THE NURSE SELECT OPTION 2, PLEASE LEAVE A MESSAGE INCLUDING: YOUR NAME DATE OF BIRTH CALL BACK NUMBER REASON FOR CALL**this is important as we prioritize the call backs  YOU WILL RECEIVE A CALL BACK THE SAME DAY AS LONG AS YOU CALL BEFORE 4:00 PM  At the [facility], you and your health needs are our priority. As part of our continuing mission to provide you with exceptional heart care, we have created designated Provider Care Teams. These Care Teams include your primary Cardiologist (physician) and Advanced Practice Providers (APPs- Physician Assistants and Nurse Practitioners) who all work together to provide you with the care you need, when you need it.   You may see any of the following providers on your designated Care Team at your next follow up: Dr Glori Bickers Dr Haynes Kerns, NP Lyda Jester, [place] [facility] [place], [place] Audry Riles, PharmD   Please be sure to bring in all your medications bottles to every appointment.

## 2021-08-27 ENCOUNTER — Telehealth (HOSPITAL_COMMUNITY): Payer: Self-pay

## 2021-08-27 ENCOUNTER — Ambulatory Visit (INDEPENDENT_AMBULATORY_CARE_PROVIDER_SITE_OTHER): Payer: Medicare Other | Admitting: *Deleted

## 2021-08-27 DIAGNOSIS — Z5181 Encounter for therapeutic drug level monitoring: Secondary | ICD-10-CM

## 2021-08-27 DIAGNOSIS — I4819 Other persistent atrial fibrillation: Secondary | ICD-10-CM | POA: Diagnosis not present

## 2021-08-27 DIAGNOSIS — Z79899 Other long term (current) drug therapy: Secondary | ICD-10-CM

## 2021-08-27 LAB — T3, FREE: T3, Free: 1.9 pg/mL — ABNORMAL LOW (ref 2.0–4.4)

## 2021-08-27 LAB — POCT INR: INR: 3 (ref 2.0–3.0)

## 2021-08-27 NOTE — Telephone Encounter (Signed)
-----   Message from Rafael Bihari, [place] sent at 08/26/2021 12:52 PM EST ----- TSH elevated. Please check free T3 and free T4.  Other labs ok.

## 2021-08-27 NOTE — Telephone Encounter (Signed)
Patient advised and verbalized understanding,lab appointment scheduled,lab orders entered  Orders Placed This Encounter  Procedures   T4, free    Standing Status:   Future    Standing Expiration Date:   08/27/2022    Order Specific Question:   Release to patient    Answer:   Immediate   T3, free    Standing Status:   Future    Standing Expiration Date:   08/27/2022    Order Specific Question:   Release to patient    Answer:   Immediate

## 2021-08-27 NOTE — Patient Instructions (Signed)
Description   Spoke with pt and instructed to continue taking 1/2 tablet daily except 1 tablet on Monday, Wednesday, and Friday. Recheck INR in 2 weeks.  AMIO DOSE INCREASED TO 400MG  ON SAT & SUN ALL OTHER DAYS STAY AT 200MG .   Pt's cell phone [phone] in case difficulty reaching pt on home phone.

## 2021-08-31 ENCOUNTER — Ambulatory Visit (INDEPENDENT_AMBULATORY_CARE_PROVIDER_SITE_OTHER): Payer: Medicare Other

## 2021-08-31 DIAGNOSIS — I5022 Chronic systolic (congestive) heart failure: Secondary | ICD-10-CM | POA: Diagnosis not present

## 2021-08-31 DIAGNOSIS — Z95810 Presence of automatic (implantable) cardiac defibrillator: Secondary | ICD-10-CM | POA: Diagnosis not present

## 2021-09-01 ENCOUNTER — Other Ambulatory Visit (HOSPITAL_COMMUNITY): Payer: Medicare Other

## 2021-09-02 NOTE — Progress Notes (Signed)
EPIC Encounter for ICM Monitoring  Patient Name: Austin [name] is a 81 y.o. male Date: 09/02/2021 Primary Care Physican: Lajean Manes, MD Primary Cardiologist: [facility] Electrophysiologist: Lovena Le 09/02/2021 Weight: 194.7 lbs   Clinical Status (26-Aug-2021 to 31-Aug-2021) AT/AF   1 episode Time in AT/AF  24.0 hr/day (100.0%)          Spoke with patient and heart failure questions reviewed.  Pt symptomatic for fluid accumulation.   He reports no energy and some chronic SOB.    Optivol Thoracic impedance suggesting normal fluid levels.    Prescribed:  Furosemide 20 mg Take 2 tablets (40 mg total) by mouth every morning AND 1 tablet (20 mg total) every evening.   Labs: 08/26/2021 Creatinine 1.54, BUN 24, Potassium 4.1, Sodium 137, GFR 46 07/31/2021 Creatinine 1.15, BUN 15, Potassium 4.3, Sodium 139, GFR 65 07/30/2021 Creatinine 1.28, BUN 21, Potassium 3.7, Sodium 141, GFR 57 A complete set of results can be found in Results Review.   Recommendations:  No changes and encouraged to call if experiencing any fluid symptoms.   Follow-up plan: ICM clinic phone appointment on 10/05/2021.   91 day device clinic remote transmission 09/23/2021.      EP/Cardiology Office Visits:  10/16/2021 with Dr Tamala Julian.   Copy of ICM check sent to Dr. Lovena Le.     3 month ICM trend: 08/31/2021.    12-14 Month ICM trend:     [name], RN 09/02/2021 1:39 PM

## 2021-09-10 ENCOUNTER — Ambulatory Visit (INDEPENDENT_AMBULATORY_CARE_PROVIDER_SITE_OTHER): Payer: Medicare Other

## 2021-09-10 DIAGNOSIS — I4819 Other persistent atrial fibrillation: Secondary | ICD-10-CM

## 2021-09-10 DIAGNOSIS — Z5181 Encounter for therapeutic drug level monitoring: Secondary | ICD-10-CM

## 2021-09-10 DIAGNOSIS — Z952 Presence of prosthetic heart valve: Secondary | ICD-10-CM | POA: Diagnosis not present

## 2021-09-10 LAB — POCT INR: INR: 3.1 — AB (ref 2.0–3.0)

## 2021-09-10 NOTE — Patient Instructions (Signed)
Description   Spoke with pt and instructed to continue taking 1/2 tablet daily except 1 tablet on Monday, Wednesday, and Friday. Recheck INR in 2 weeks.   AMIO DOSE INCREASED TO 400MG  ON SAT & SUN ALL OTHER DAYS STAY AT 200MG .   Pt's cell phone [phone] in case difficulty reaching pt on home phone.

## 2021-09-10 NOTE — Progress Notes (Signed)
31

## 2021-09-15 DIAGNOSIS — I472 Ventricular tachycardia, unspecified: Secondary | ICD-10-CM | POA: Diagnosis not present

## 2021-09-21 NOTE — Telephone Encounter (Signed)
Advanced Heart Failure Patient Advocate Encounter  The patient was approved for a PAN HF grant that will help cover the cost of Verquvo. Total amount awarded, $1200. Eligibility dates, 08/18/21-08/17/22.   BIN [id number] PCN PANF ID  [id number] Group [id number]   Called and spoke with the patient's wife. Emailed copy of grant information.    Charlann Boxer, CPhT

## 2021-09-23 ENCOUNTER — Ambulatory Visit (INDEPENDENT_AMBULATORY_CARE_PROVIDER_SITE_OTHER): Payer: Medicare Other

## 2021-09-23 DIAGNOSIS — I472 Ventricular tachycardia, unspecified: Secondary | ICD-10-CM | POA: Diagnosis not present

## 2021-09-24 ENCOUNTER — Ambulatory Visit (INDEPENDENT_AMBULATORY_CARE_PROVIDER_SITE_OTHER): Payer: Medicare Other | Admitting: *Deleted

## 2021-09-24 DIAGNOSIS — I4819 Other persistent atrial fibrillation: Secondary | ICD-10-CM

## 2021-09-24 DIAGNOSIS — Z5181 Encounter for therapeutic drug level monitoring: Secondary | ICD-10-CM

## 2021-09-24 LAB — CUP PACEART REMOTE DEVICE CHECK
Battery Remaining Longevity: 32 mo
Battery Voltage: 2.95 V
Brady Statistic AP VP Percent: 4.91 %
Brady Statistic AP VS Percent: 0.24 %
Brady Statistic AS VP Percent: 87.1 %
Brady Statistic AS VS Percent: 7.75 %
Brady Statistic RA Percent Paced: 4.95 %
Brady Statistic RV Percent Paced: 92.2 %
Date Time Interrogation Session: 20230301233324
HighPow Impedance: 70 Ohm
Implantable Lead Implant Date: 20130110
Implantable Lead Implant Date: 20130110
Implantable Lead Location: 753859
Implantable Lead Location: 753860
Implantable Lead Model: 5076
Implantable Lead Model: 6935
Implantable Pulse Generator Implant Date: 20181010
Lead Channel Impedance Value: 304 Ohm
Lead Channel Impedance Value: 399 Ohm
Lead Channel Impedance Value: 399 Ohm
Lead Channel Pacing Threshold Amplitude: 0.875 V
Lead Channel Pacing Threshold Pulse Width: 0.4 ms
Lead Channel Sensing Intrinsic Amplitude: 0.375 mV
Lead Channel Sensing Intrinsic Amplitude: 0.375 mV
Lead Channel Sensing Intrinsic Amplitude: 5.5 mV
Lead Channel Sensing Intrinsic Amplitude: 5.5 mV
Lead Channel Setting Pacing Amplitude: 2.5 V
Lead Channel Setting Pacing Amplitude: 3.5 V
Lead Channel Setting Pacing Pulse Width: 0.4 ms
Lead Channel Setting Sensing Sensitivity: 0.3 mV

## 2021-09-24 LAB — POCT INR: INR: 4.2 — AB (ref 2.0–3.0)

## 2021-09-24 NOTE — Patient Instructions (Signed)
Description   ?Spoke with pt and instructed to hold today's dose of warfarin then continue taking 1/2 tablet daily except 1 tablet on Monday, Wednesday, and Friday. Recheck INR in 2 weeks.  ? ?AMIO DOSE INCREASED TO 400MG  ON SAT & SUN ALL OTHER DAYS STAY AT 200MG .   ?Pt's cell phone [phone] in case difficulty reaching pt on home phone. ?  ?  ?

## 2021-09-29 ENCOUNTER — Other Ambulatory Visit: Payer: Self-pay | Admitting: Internal Medicine

## 2021-09-30 NOTE — Progress Notes (Signed)
Remote ICD transmission.   

## 2021-09-30 NOTE — Telephone Encounter (Signed)
This is a CHF pt 

## 2021-10-01 ENCOUNTER — Encounter: Payer: Self-pay | Admitting: Internal Medicine

## 2021-10-05 ENCOUNTER — Ambulatory Visit (INDEPENDENT_AMBULATORY_CARE_PROVIDER_SITE_OTHER): Payer: Medicare Other

## 2021-10-05 DIAGNOSIS — Z95810 Presence of automatic (implantable) cardiac defibrillator: Secondary | ICD-10-CM | POA: Diagnosis not present

## 2021-10-05 DIAGNOSIS — I5022 Chronic systolic (congestive) heart failure: Secondary | ICD-10-CM

## 2021-10-07 ENCOUNTER — Telehealth: Payer: Self-pay

## 2021-10-07 NOTE — Progress Notes (Signed)
EPIC Encounter for ICM Monitoring ? ?Patient Name: Austin [name] is a 81 y.o. male ?Date: 10/07/2021 ?Primary Care Physican: Lajean Manes, MD ?Primary Cardiologist: [facility] ?Electrophysiologist: Lovena Le ?09/02/2021 Weight: 194.7 lbs ?  ?Clinical Status (23-Sep-2021 to 05-Oct-2021) ?AT/AF   10 episodes ?Time in AT/AF  24.0 hr/day (99.9%) ?Longest AT/AF 5 days ?  ?       Attempted call to patient and unable to reach.  Left detailed message per DPR regarding transmission. Transmission reviewed.  VT ablation completed 1/23. ?  ?Optivol Thoracic impedance suggesting normal fluid levels.  ?  ?Prescribed:  ?Furosemide 20 mg Take 2 tablets (40 mg total) by mouth every morning AND 1 tablet (20 mg total) every evening. ?  ?Labs: ?08/26/2021 Creatinine 1.54, BUN 24, Potassium 4.1, Sodium 137, GFR 46 ?07/31/2021 Creatinine 1.15, BUN 15, Potassium 4.3, Sodium 139, GFR 65 ?07/30/2021 Creatinine 1.28, BUN 21, Potassium 3.7, Sodium 141, GFR 57 ?A complete set of results can be found in Results Review. ?  ?Recommendations:  Left voice mail with ICM number and encouraged to call if experiencing any fluid symptoms. ?  ?Follow-up plan: ICM clinic phone appointment on 11/09/2021.   91 day device clinic remote transmission 12/23/2021.    ?  ?EP/Cardiology Office Visits:  10/16/2021 with Dr Tamala Julian.  10/27/2021 with Dr Aundra Dubin (and echo). ?  ?Copy of ICM check sent to Dr. Lovena Le.    ? ?3 month ICM trend: 10/05/2021. ? ? ? ?12-14 Month ICM trend:  ? ? ? ?[name], RN ?10/07/2021 ?2:14 PM ? ?

## 2021-10-07 NOTE — Telephone Encounter (Signed)
Remote ICM transmission received.  Attempted call to patient regarding ICM remote transmission and left detailed message per DPR.  Advised to return call for any fluid symptoms or questions. Next ICM remote transmission scheduled 11/09/2021.   ? ?

## 2021-10-08 ENCOUNTER — Ambulatory Visit (INDEPENDENT_AMBULATORY_CARE_PROVIDER_SITE_OTHER): Payer: Medicare Other | Admitting: Cardiology

## 2021-10-08 DIAGNOSIS — I4891 Unspecified atrial fibrillation: Secondary | ICD-10-CM

## 2021-10-08 DIAGNOSIS — Z5181 Encounter for therapeutic drug level monitoring: Secondary | ICD-10-CM

## 2021-10-08 LAB — POCT INR: INR: 4.1 — AB (ref 2.0–3.0)

## 2021-10-08 NOTE — Patient Instructions (Signed)
Description   ?Spoke with pt and instructed to hold today's dose of warfarin then START taking warfarin 1/2 a tablet daily except for 1 tablet on Mondays and Fridays. Recheck INR in 2 weeks. Coumadin Clinic [phone].  ? ?AMIO DOSE INCREASED TO '400MG'$  ON SAT & SUN ALL OTHER DAYS STAY AT '200MG'$ .   ?Pt's cell phone [phone] in case difficulty reaching pt on home phone. ?  ? ? ?

## 2021-10-13 NOTE — Progress Notes (Signed)
?Cardiology Office Note:   ? ?Date:  10/15/2021  ? ?ID:  Austin [name], DOB Mar 25, 1941, MRN [id number] ? ?PCP:  Lajean Manes, MD  ?Cardiologist:  Sinclair Grooms, MD  ? ?Referring MD: Lajean Manes, MD  ? ?Chief Complaint  ?Patient presents with  ? Cardiac Valve Problem  ? Atrial Fibrillation  ? Congestive Heart Failure  ? ? ?History of Present Illness:   ? ?Austin [name] is a 81 y.o. male with a hx of  mechanical mitral valve s/p MV endocarditis, VT, nonischemic cardiomyopathy, complete heart block was referred by Dr. Tamala Julian for evaluation of CHF.  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 He subsequently developed a presumed nonischemic cardiomyopathy.  This was complicated by VT, he had a VT ablation in 2017 and had a Medtronic CRT-D device implanted. This had to be removed due to infection in 11/12 and was reimplanted in 1/13. Last echo in 5/22 showed EF 30% with normal RV, mechanical MV stable with mean gradient 3 mmHg.  He has been in atrial fibrillation persistently since late 2021.  He is on amiodarone and has been cardioverted in the past. His LV lead is not functional.  He has complete heart block and is persistently RV pacing. saw Dr. Lovena Le, and placement of a left bundle or LV lead was decided to be too risky a procedure. Referred to Dr. Oneida Alar and had VT ablation in January.  Amiodarone is now down to 200 mg/day. ? ? ?Despite reduction in amiodarone dose, he does not feel well.  He felt great immediately following the ablation.  Prior to the ablation his weights were in the 189 to 190 pound range.  After the ablation his weight increased into the 1 94-1 96 range.  He felt great.  This went on for 3 to 4 weeks.  Subsequently he has gradually felt worse and worse and has noted that his weights have consistently been in the 203-208 range.  He is noted lower extremity swelling.  This was discussed with Dr. Aundra Dubin and his furosemide dose has been increased to  40 mg twice daily until the weight decreases.  He is complaining a limitation now shortness of breath. ? ?Past Medical History:  ?Diagnosis Date  ? AICD (automatic cardioverter/defibrillator) present   ? Atrial fibrillation ([place])   ? 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   ? cx by infection, explantation11/12 & reimplant 1/13  ? Conductive hearing loss   ? Intraspinal abscess   ? Mitral valve insufficiency and aortic valve insufficiency   ? s/p MVR mechanical  ? Nonischemic cardiomyopathy ([place])   ? Psychosexual dysfunction with inhibited sexual excitement   ? S/P mitral valve replacement   ? Syncope and collapse   ? Unspecified sleep apnea   ? last sleep study 11/07  ? Ventricular tachycardia   ? VT (ventricular tachycardia)   ? ? ?Past Surgical History:  ?Procedure Laterality Date  ? BIV ICD GENERATOR CHANGEOUT N/A 05/04/2017  ? Procedure: BiV ICD Generator Changeout;  Surgeon: Evans Lance, MD;  Location: [place] [facility];  Service: Cardiovascular;  Laterality: N/A;  ? CARDIAC CATHETERIZATION  04/24/2002  ? CARDIAC VALVE REPLACEMENT    ? CARDIOVERSION  03/09/2012  ? Procedure: CARDIOVERSION;  Surgeon: Evans Lance, MD;  Location: [place];  Service: Cardiovascular;  Laterality: N/A;  ? CARDIOVERSION N/A 11/22/2013  ? Procedure: CARDIOVERSION;  Surgeon: Sanda Klein, MD;  Location: [place];  Service: Cardiovascular;  Laterality: N/A;  ? dental implants    ? ELECTROPHYSIOLOGIC STUDY N/A 09/15/2015  ? Procedure: V Tach Ablation;  Surgeon: Evans Lance, MD;  Location: [place] [facility];  Service: Cardiovascular;  Laterality: N/A;  ? Evacution of epidural lumbar epidural abscess  1999  ? INSERT / REPLACE / REMOVE PACEMAKER  11/2008  ? MITRAL VALVE REPLACEMENT    ? w [email or web address]  ? PERMANENT PACEMAKER INSERTION N/A 08/05/2011  ? Procedure: PERMANENT PACEMAKER INSERTION;  Surgeon: Evans Lance, MD;  Location: [facility];  Service: Cardiovascular;  Laterality: N/A;  ? RIGHT  HEART CATH N/A 02/26/2021  ? Procedure: RIGHT HEART CATH;  Surgeon: Larey Dresser, MD;  Location: [place] [facility];  Service: Cardiovascular;  Laterality: N/A;  ? THYROIDECTOMY    ? TONSILLECTOMY    ? VALVE REPLACEMENT  2000  ? ? ?Current Medications: ?Current Meds  ?Medication Sig  ? acetaminophen (TYLENOL) 500 MG tablet Take 1,000 mg by mouth every 6 (six) hours as needed (pain).  ? amiodarone (PACERONE) 200 MG tablet Take 1 tablet by mouth daily.  ? amoxicillin (AMOXIL) 500 MG capsule TAKE 4 CAPSULES BY MOUTH 30 MINUTES PRIOR TO DENTAL WORK  ? furosemide (LASIX) 20 MG tablet Take 2 tablets (40 mg total) by mouth every morning AND 1 tablet (20 mg total) every evening.  ? levothyroxine (SYNTHROID) 50 MCG tablet Take 1 tablet (50 mcg total) by mouth daily before breakfast.  ? melatonin 5 MG TABS Take 5 mg by mouth at bedtime.  ? metoprolol succinate (TOPROL-XL) 25 MG 24 hr tablet Take 0.5 tablets (12.5 mg total) by mouth daily. Take with or immediately following a meal.  ? Multiple Vitamin (MULTIVITAMIN WITH MINERALS) TABS tablet Take 1 tablet by mouth in the morning.  ? nitroGLYCERIN (NITROSTAT) 0.4 MG SL tablet Place 1 tablet (0.4 mg total) under the tongue every 5 (five) minutes x 3 doses as needed for chest pain.  ? valsartan (DIOVAN) 40 MG tablet Take 0.5 tablets (20 mg total) by mouth 2 (two) times daily.  ? Vericiguat (VERQUVO) 5 MG TABS Take 1 tablet (5 mg total) by mouth daily.  ? warfarin (COUMADIN) 5 MG tablet TAKE AS DIRECTED BY THE COUMADIN CLINIC  ?  ? ?Allergies:   Patient has no known allergies.  ? ?Social History  ? ?Socioeconomic History  ? Marital status: Married  ?  Spouse name: Not on file  ? Number of children: 1  ? Years of education: 82  ? Highest education level: Master's degree (e.g., MA, MS, MEng, MEd, MSW, MBA)  ?Occupational History  ? Occupation: retired from Engineer, manufacturing systems  ?Tobacco Use  ? Smoking status: Never  ? Smokeless tobacco: Never  ?Vaping Use  ? Vaping Use: Never used   ?Substance and Sexual Activity  ? Alcohol use: Yes  ?  Alcohol/week: 0.0 standard drinks  ?  Comment: occasionally  ? Drug use: No  ? Sexual activity: Not on file  ?Other Topics Concern  ? Not on file  ?Social History Narrative  ? Lives with wife in a one story home.  Has one daughter.  Retired.  Education: Masters.  ? ?Social Determinants of Health  ? ?Financial Resource Strain: Not on file  ?Food Insecurity: Not on file  ?Transportation Needs: Not on file  ?Physical Activity: Not on file  ?Stress: Not on file  ?Social Connections: Not on file  ?  ? ?Family History: ?The patient's family history includes Heart disease  in his father and mother; Heart failure in his father and mother. ? ?ROS:   ?Please see the history of present illness.    ?Seems somewhat depressed.  All other systems reviewed and are negative. ? ?EKGs/Labs/Other Studies Reviewed:   ? ?The following studies were reviewed today: ?No new imaging ? ?EKG:  EKG not repeated ? ?Recent Labs: ?11/05/2020: NT-Pro BNP 1,191 ?12/24/2020: Magnesium 2.3 ?06/12/2021: ALT 27; B Natriuretic Peptide 529.6; Hemoglobin 13.5; Platelets 151 ?08/26/2021: BUN 24; Creatinine, Ser 1.54; Potassium 4.1; Sodium 137; TSH 10.832  ?Recent Lipid Panel ?   ?Component Value Date/Time  ? CHOL 158 08/23/2015 0543  ? TRIG 130 08/23/2015 0543  ? HDL 50 08/23/2015 0543  ? CHOLHDL 3.2 08/23/2015 0543  ? VLDL 26 08/23/2015 0543  ? [place] 82 08/23/2015 0543  ? ? ?Physical Exam:   ? ?VS:  BP 100/72   Pulse 77   Ht '6\' 2"'$  (1.88 m)   Wt 204 lb 6.4 oz (92.7 kg)   SpO2 98%   BMI 26.24 kg/m?    ? ?Wt Readings from Last 3 Encounters:  ?10/15/21 204 lb 6.4 oz (92.7 kg)  ?08/26/21 200 lb (90.7 kg)  ?07/29/21 202 lb 9.6 oz (91.9 kg)  ?  ? ?GEN: Plethoric face.. No acute distress ?HEENT: Normal ?NECK: No JVD. ?LYMPHATICS: No lymphadenopathy ?CARDIAC: Soft systolic murmur left mid sternal border.  Mechanical valve closure sounds of the mitral valve.  Irregularly irregular RR without gallop, but 2+  bilateral pedal to mid shin edema. ?VASCULAR:  normal Pulses. No bruits. ?RESPIRATORY:  Clear to auscultation without rales, wheezing or rhonchi  ?ABDOMEN: Soft, non-tender, non-distended, No pulsatile mass, ?MUSCU

## 2021-10-15 ENCOUNTER — Other Ambulatory Visit: Payer: Self-pay

## 2021-10-15 ENCOUNTER — Encounter: Payer: Self-pay | Admitting: Interventional Cardiology

## 2021-10-15 ENCOUNTER — Ambulatory Visit: Payer: Medicare Other | Admitting: Interventional Cardiology

## 2021-10-15 VITALS — BP 100/72 | HR 77 | Ht 74.0 in | Wt 204.4 lb

## 2021-10-15 DIAGNOSIS — I5022 Chronic systolic (congestive) heart failure: Secondary | ICD-10-CM

## 2021-10-15 DIAGNOSIS — Z7901 Long term (current) use of anticoagulants: Secondary | ICD-10-CM

## 2021-10-15 DIAGNOSIS — Z95810 Presence of automatic (implantable) cardiac defibrillator: Secondary | ICD-10-CM | POA: Diagnosis not present

## 2021-10-15 DIAGNOSIS — Z952 Presence of prosthetic heart valve: Secondary | ICD-10-CM | POA: Diagnosis not present

## 2021-10-15 DIAGNOSIS — Z79899 Other long term (current) drug therapy: Secondary | ICD-10-CM | POA: Diagnosis not present

## 2021-10-15 DIAGNOSIS — I472 Ventricular tachycardia, unspecified: Secondary | ICD-10-CM | POA: Diagnosis not present

## 2021-10-15 DIAGNOSIS — I4819 Other persistent atrial fibrillation: Secondary | ICD-10-CM

## 2021-10-15 NOTE — Patient Instructions (Signed)

## 2021-10-16 ENCOUNTER — Ambulatory Visit: Payer: Medicare Other | Admitting: Interventional Cardiology

## 2021-10-22 ENCOUNTER — Ambulatory Visit (INDEPENDENT_AMBULATORY_CARE_PROVIDER_SITE_OTHER): Payer: Medicare Other | Admitting: *Deleted

## 2021-10-22 DIAGNOSIS — Z5181 Encounter for therapeutic drug level monitoring: Secondary | ICD-10-CM

## 2021-10-22 DIAGNOSIS — I4819 Other persistent atrial fibrillation: Secondary | ICD-10-CM

## 2021-10-22 LAB — POCT INR: INR: 2.1 (ref 2.0–3.0)

## 2021-10-22 NOTE — Patient Instructions (Signed)
Description   ?Spoke with pt and instructed to take 1 tablet today and 1.5 tablets tomorrow then START taking warfarin 1/2 tablet daily except for 1 tablet on Mondays, Wednesdays, and Fridays. Recheck INR in 2 weeks (per request). [facility] [phone].  ? ?AMIO DOSE INCREASED TO '400MG'$  ON SAT & SUN ALL OTHER DAYS STAY AT '200MG'$ .   ?Pt's cell phone [phone] in case difficulty reaching pt on home phone. ?  ?  ?

## 2021-10-26 IMAGING — DX DG CHEST 1V PORT
1 series · 1 of 1 positions shown · non-contrast
Comparison: 10/12/2018 chest radiograph and prior.

CLINICAL DATA: SOB, CP

EXAM:
PORTABLE CHEST 1 VIEW

[chest]
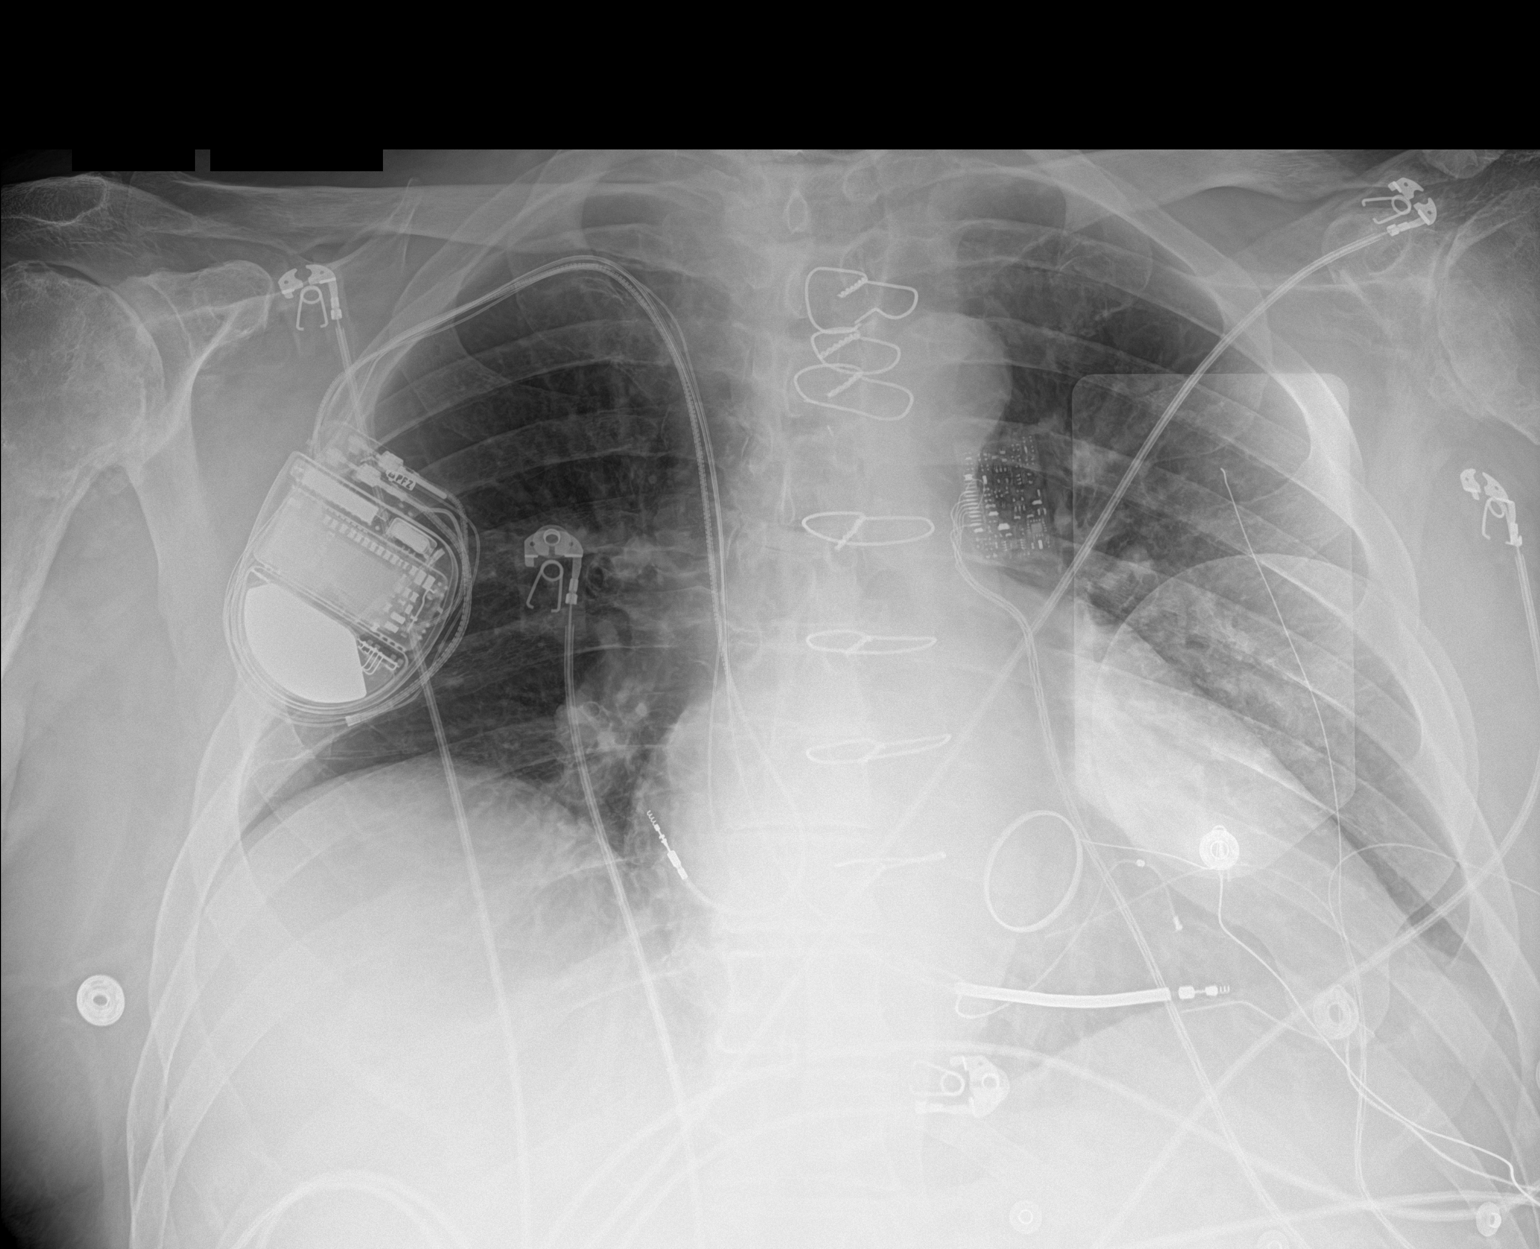

[1 of 1 positions shown; findings below may reference images not displayed]

FINDINGS: Indwelling right chest wall pacing device. Cardiomegaly. Prominence
of the central pulmonary vessels. No pneumothorax, focal
consolidation or pleural effusion. Left chest wall pacing pads.
Sequela of mitral valve repair and prior sternotomy. Bilateral
shoulder osteoarthrosis.
IMPRESSION: Cardiomegaly and prominence of the central pulmonary vessels.

No focal consolidation.

## 2021-10-27 ENCOUNTER — Ambulatory Visit (HOSPITAL_COMMUNITY)
Admission: RE | Admit: 2021-10-27 | Discharge: 2021-10-27 | Disposition: A | Discharge status: Home / Self Care | Payer: Medicare Other | Source: Ambulatory Visit | Attending: Cardiology | Admitting: Cardiology

## 2021-10-27 ENCOUNTER — Encounter (HOSPITAL_COMMUNITY): Payer: Self-pay | Admitting: Cardiology

## 2021-10-27 ENCOUNTER — Ambulatory Visit (HOSPITAL_BASED_OUTPATIENT_CLINIC_OR_DEPARTMENT_OTHER)
Admission: RE | Admit: 2021-10-27 | Discharge: 2021-10-27 | Disposition: A | Discharge status: Home / Self Care | Payer: Medicare Other | Source: Ambulatory Visit

## 2021-10-27 VITALS — BP 138/80 | HR 70 | Wt 199.8 lb

## 2021-10-27 DIAGNOSIS — Z8679 Personal history of other diseases of the circulatory system: Secondary | ICD-10-CM | POA: Insufficient documentation

## 2021-10-27 DIAGNOSIS — I5022 Chronic systolic (congestive) heart failure: Secondary | ICD-10-CM

## 2021-10-27 DIAGNOSIS — Z950 Presence of cardiac pacemaker: Secondary | ICD-10-CM | POA: Insufficient documentation

## 2021-10-27 DIAGNOSIS — R0602 Shortness of breath: Secondary | ICD-10-CM | POA: Diagnosis not present

## 2021-10-27 DIAGNOSIS — R0600 Dyspnea, unspecified: Secondary | ICD-10-CM | POA: Insufficient documentation

## 2021-10-27 DIAGNOSIS — Z7901 Long term (current) use of anticoagulants: Secondary | ICD-10-CM | POA: Insufficient documentation

## 2021-10-27 DIAGNOSIS — Z79899 Other long term (current) drug therapy: Secondary | ICD-10-CM | POA: Insufficient documentation

## 2021-10-27 DIAGNOSIS — Z952 Presence of prosthetic heart valve: Secondary | ICD-10-CM | POA: Insufficient documentation

## 2021-10-27 DIAGNOSIS — Z09 Encounter for follow-up examination after completed treatment for conditions other than malignant neoplasm: Secondary | ICD-10-CM | POA: Insufficient documentation

## 2021-10-27 DIAGNOSIS — E039 Hypothyroidism, unspecified: Secondary | ICD-10-CM | POA: Insufficient documentation

## 2021-10-27 DIAGNOSIS — I4819 Other persistent atrial fibrillation: Secondary | ICD-10-CM

## 2021-10-27 LAB — COMPREHENSIVE METABOLIC PANEL
ALT: 24 U/L (ref 0–44)
AST: 28 U/L (ref 15–41)
Albumin: 3.9 g/dL (ref 3.5–5.0)
Alkaline Phosphatase: 70 U/L (ref 38–126)
Anion gap: 9 (ref 5–15)
BUN: 30 mg/dL — ABNORMAL HIGH (ref 8–23)
CO2: 29 mmol/L (ref 22–32)
Calcium: 9.1 mg/dL (ref 8.9–10.3)
Chloride: 103 mmol/L (ref 98–111)
Creatinine, Ser: 1.54 mg/dL — ABNORMAL HIGH (ref 0.61–1.24)
GFR, Estimated: 46 mL/min — ABNORMAL LOW (ref >60)
Glucose, Bld: 93 mg/dL (ref 70–99)
Potassium: 4.7 mmol/L (ref 3.5–5.1)
Sodium: 141 mmol/L (ref 135–145)
Total Bilirubin: 1 mg/dL (ref 0.3–1.2)
Total Protein: 7 g/dL (ref 6.5–8.1)

## 2021-10-27 LAB — ECHOCARDIOGRAM COMPLETE
AR max vel: 1.62 cm2
AV Area VTI: 1.58 cm2
AV Area mean vel: 1.51 cm2
AV Mean grad: 5 mmHg
AV Peak grad: 8 mmHg
Ao pk vel: 1.41 m/s
Area-P 1/2: 3.43 cm2
S' Lateral: 5 cm

## 2021-10-27 LAB — TSH: TSH: 13.373 u[IU]/mL — ABNORMAL HIGH (ref 0.350–4.500)

## 2021-10-27 LAB — BRAIN NATRIURETIC PEPTIDE: B Natriuretic Peptide: 545.5 pg/mL — ABNORMAL HIGH (ref 0.0–100.0)

## 2021-10-27 MED ORDER — FUROSEMIDE 20 MG PO TABS: 40.0000 mg | ORAL_TABLET | Freq: Two times a day (BID) | ORAL | 8 refills | Status: DC

## 2021-10-27 MED ORDER — SPIRONOLACTONE 25 MG PO TABS: 12.5000 mg | ORAL_TABLET | Freq: Every day | ORAL | 3 refills | Status: DC

## 2021-10-27 MED ORDER — AMIODARONE HCL 200 MG PO TABS: 100.0000 mg | ORAL_TABLET | Freq: Every day | ORAL | 5 refills | Status: DC

## 2021-10-27 NOTE — Patient Instructions (Signed)
Medication Changes: ? ?Decrease Amiodarone to 100 mg (1/2 Tab) daily ? ?Increase lasix to '60mg'$  in the morning and 40 mg in the evening for 3 days, then go to 40 mg Twice daily ? ?Start Spironolacotne 12.5 mg (1/2 Tab) daily ? ?Lab Work: ? ?Labs done today, your results will be available in MyChart, we will contact you for abnormal readings. ? ? ?Testing/Procedures: ? ?Repeat blood work in 10 days ? ?Referrals: ? ?none ? ?Special Instructions // Education: ? ?none ? ?Follow-Up in: 3 weeks  ? ?At the [facility], you and your health needs are our priority. We have a designated team specialized in the treatment of Heart Failure. This Care Team includes your primary Heart Failure Specialized Cardiologist (physician), Advanced Practice Providers (APPs- Physician Assistants and Nurse Practitioners), and Pharmacist who all work together to provide you with the care you need, when you need it.  ? ?You may see any of the following providers on your designated Care Team at your next follow up: ? ?Dr Glori Bickers ?Dr Loralie Champagne ?Darrick Grinder, NP ?Lyda Jester, PA ?Jessica Milford,NP ?Marlyce Huge, PA ?Audry Riles, PharmD ? ? ?Please be sure to bring in all your medications bottles to every appointment.  ? ?Need to Contact us: ? ?If you have any questions or concerns before your next appointment please send [place] a message through [place] or call our office at [phone].   ? ?TO LEAVE A MESSAGE FOR THE NURSE SELECT OPTION 2, PLEASE LEAVE A MESSAGE INCLUDING: ?YOUR NAME ?DATE OF BIRTH ?CALL BACK NUMBER ?REASON FOR CALL**this is important as we prioritize the call backs ? ?YOU WILL RECEIVE A CALL BACK THE SAME DAY AS LONG AS YOU CALL BEFORE 4:00 PM ? ? ?

## 2021-10-27 NOTE — Progress Notes (Signed)
?PCP: Lajean Manes, MD ?Cardiology: Dr. Tamala Julian ?HF Cardiology: Dr. Aundra Dubin ?EP: Dr. Lovena Le ? ?81 y.o. with history of mechanical mitral valve s/p MV endocarditis, VT, nonischemic cardiomyopathy, complete heart block was referred by Dr. Tamala Julian for evaluation of CHF.  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 He subsequently developed a presumed nonischemic cardiomyopathy.  This was complicated by VT, he had a VT ablation in 2017 and had a Medtronic CRT-D device implanted. This had to be removed due to infection in 11/12 and was reimplanted in 1/13. Last echo in 5/22 showed EF 30% with normal RV, mechanical MV stable with mean gradient 3 mmHg.  He has been in atrial fibrillation persistently since late 2021.  He is on amiodarone and has been cardioverted in the past. His LV lead is not functional.  He has complete heart block and is persistently RV pacing.  ? ?Had [place] 02/26/21 RA 4, PA 32/12, PCWP 11, CO 4.6, CI 2.2. Normal filling pressures and slightly low cardiac output.  ? ?Digoxin was stopped due to elevated level.  ? ?He saw Dr. Lovena Le in 9/22 and was in slow VT in the office.  He was paced out of VT and amiodarone was increased to 200 mg daily.  ? ?He saw Dr. Lovena Le, and placement of a left bundle or LV lead was decided to be too risky a procedure.  He was referred to Dr. Oneida Alar with the hope that he could have a VT ablation and get off amiodarone, which is likely playing a role in his symptoms.    ? ?S/p VT ablation with Dr. Oneida Alar 1/23. ? ?Echo was done today and reviewed, EF 20-25% with diffuse hypokinesis and septal-lateral dyssynchrony, mildly decreased RV systolic function, mechanical MV with normal function, dilated IVC.  ? ?He returns for followup of CHF.  Weight down 1 lb. He reports increased dyspnea recently.  He is short of breath walking up hill or incline, short of breath walking 100 feet.  No orthopnea/PND.  No chest pain.   ? ?Medtronic device  interrogation: thoracic impedence stable, 91% RV-paced ? ?ECG (personally reviewed): A-V sequential pacing ? ?Labs (7/22): K 4.6, creatinine 1.3 ?Labs (8/22): K 4.4, creatinine 1.23 ?Labs (10/22): K 3.9, creatinine 1.36 ?Labs (12/22): K 4.2, creatinine 1.39 ?Labs (1/23): K 4.3, creatinine 1.15 ?Labs (2/23): TSH 10, K 4.1, creatinine 1.54 ? ?PMH: ?1. 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 ?2. H/o VT: VT ablation in 2017.  ?- Redo VT ablation in 1/23 with Dr. Oneida Alar ?3. Atrial fibrillation/atypical flutter: Persistent since 2021.  Failed amiodarone.  ?4. PAD: Tibial occlusive disease.  ?5. 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  ?6. Hypothyroidism.  ?7. Chronic systolic CHF: Presumed nonischemic cardiomyopathy.  Medtronic CRT-D device, LV lead is nonfunctional.  ?- Echo (2012): EF 45% ?- Echo (1/17): EF 30-35% ?- Echo (6/21): EF 15-20% ?- Echo (11/21): EF 30-35%  ?- Echo (5/22): EF 30%, normal RV, mechanical mitral valve mean gradient 3 mmHg.  ?- RHC (8/22): RA mean 4, RV 33/4,PA 32/12 mean 21, PCWP mean 11, Oxygen saturations:PA 71% ?- Echo (4/23): EF 20-25% with diffuse hypokinesis and septal-lateral dyssynchrony, mildly decreased RV systolic function, mechanical MV with normal function, dilated IVC.  ?AO 98%, Cardiac Output (Fick) 4.66 Cardiac Index (Fick) 2.18 ? ?Social History  ? ?Socioeconomic History  ? Marital status: Married  ?  Spouse name: Not on file  ? Number of children: 1  ? Years of education: 70  ?  Highest education level: Master's degree (e.g., MA, MS, MEng, MEd, MSW, MBA)  ?Occupational History  ? Occupation: retired from Engineer, manufacturing systems  ?Tobacco Use  ? Smoking status: Never  ? Smokeless tobacco: Never  ?Vaping Use  ? Vaping Use: Never used  ?Substance and Sexual Activity  ? Alcohol use: Yes  ?  Alcohol/week: 0.0 standard drinks  ?  Comment: occasionally  ? Drug use: No  ? Sexual activity: Not on file  ?Other Topics Concern  ? Not on file  ?Social History Narrative   ? Lives with wife in a one story home.  Has one daughter.  Retired.  Education: Masters.  ? ?Social Determinants of Health  ? ?Financial Resource Strain: Not on file  ?Food Insecurity: Not on file  ?Transportation Needs: Not on file  ?Physical Activity: Not on file  ?Stress: Not on file  ?Social Connections: Not on file  ?Intimate Partner Violence: Not on file  ? ?Family History  ?Problem Relation Age of Onset  ? Heart disease Mother   ? Heart failure Mother   ? Heart disease Father   ? Heart failure Father   ? ?ROS: All systems reviewed and negative except as per HPI.  ? ?Current Outpatient Medications  ?Medication Sig Dispense Refill  ? acetaminophen (TYLENOL) 500 MG tablet Take 1,000 mg by mouth every 6 (six) hours as needed (pain).    ? amoxicillin (AMOXIL) 500 MG capsule TAKE 4 CAPSULES BY MOUTH 30 MINUTES PRIOR TO DENTAL WORK 4 capsule 2  ? levothyroxine (SYNTHROID) 50 MCG tablet Take 1 tablet (50 mcg total) by mouth daily before breakfast. 90 tablet 2  ? melatonin 5 MG TABS Take 5 mg by mouth at bedtime.    ? metoprolol succinate (TOPROL-XL) 25 MG 24 hr tablet Take 0.5 tablets (12.5 mg total) by mouth daily. Take with or immediately following a meal. 45 tablet 3  ? Multiple Vitamin (MULTIVITAMIN WITH MINERALS) TABS tablet Take 1 tablet by mouth in the morning.    ? nitroGLYCERIN (NITROSTAT) 0.4 MG SL tablet Place 1 tablet (0.4 mg total) under the tongue every 5 (five) minutes x 3 doses as needed for chest pain. 25 tablet 2  ? spironolactone (ALDACTONE) 25 MG tablet Take 0.5 tablets (12.5 mg total) by mouth daily. 90 tablet 3  ? valsartan (DIOVAN) 40 MG tablet Take 0.5 tablets (20 mg total) by mouth 2 (two) times daily. 30 tablet 11  ? Vericiguat (VERQUVO) 5 MG TABS Take 1 tablet (5 mg total) by mouth daily. 30 tablet 6  ? warfarin (COUMADIN) 5 MG tablet TAKE AS DIRECTED BY THE COUMADIN CLINIC 80 tablet 1  ? amiodarone (PACERONE) 200 MG tablet Take 0.5 tablets (100 mg total) by mouth daily. 15 tablet 5  ?  furosemide (LASIX) 20 MG tablet Take 2 tablets (40 mg total) by mouth 2 (two) times daily. 90 tablet 8  ? ?No current facility-administered medications for this encounter.  ? ?BP 138/80   Pulse 70   Wt 90.6 kg (199 lb 12.8 oz)   SpO2 97%   BMI 25.65 kg/m?  ? ?Wt Readings from Last 3 Encounters:  ?10/27/21 90.6 kg (199 lb 12.8 oz)  ?10/15/21 92.7 kg (204 lb 6.4 oz)  ?08/26/21 90.7 kg (200 lb)  ? ?General: NAD ?Neck: JVP 8-9 cm, no thyromegaly or thyroid nodule.  ?Lungs: Mild crackles at bases.  ?CV: Nondisplaced PMI.  Heart regular S1/S2 with mechanical S1, no S3/S4, no murmur. 1+ ankle edema.  No carotid bruit.  Normal pedal pulses.  ?Abdomen: Soft, nontender, no hepatosplenomegaly, no distention.  ?Skin: Intact without lesions or rashes.  ?Neurologic: Alert and oriented x 3.  ?Psych: Normal affect. ?Extremities: No clubbing or cyanosis.  ?HEENT: Normal.  ? ?Assessment/Plan: ?1. Chronic systolic CHF: Long-standing, suspected nonischemic cardiomyopathy.  Echo today showed  EF 20-25% with diffuse hypokinesis and septal-lateral dyssynchrony, mildly decreased RV systolic function, mechanical MV with normal function, dilated IVC.  He has a Medtronic CRT-D device present, but the LV lead is not functional.  He has complete heart block, so is RV paced chronically.   It looks like he is predominantly in  atrial fibrillation but does have short periods of NSR (he is actually a-paced today).  Had [place] on 02/26/21 with normal filling pressures and mildly low cardiac output.  NYHA class III symptoms, mild volume overload on exam. He has had difficult tolerating GDMT.  ?- Increase Lasix to 60 qam/40 qpm x 3 days then 40 mg bid after that.  BMET today and in 10 days.  ?- Start spironolactone 12.5 mg daily.   ?- Off digoxin due to persistently elevated level.  ?- With orthostatic symptoms, he is now off Entresto and on valsartan 20 mg bid.   ?- Continue Toprol XL 12.5 mg daily at bedtime.  ?- Continue Verquvo 5 mg daily.   ?- The  patient has CHB and is chronically RV pacing as his LV lead is nonfunctional.  This is not ideal and creates dyssynchrony.  Remains symptomatic with limited ability to titrate meds. He has seen Dr. Lovena Le, it would

## 2021-10-30 ENCOUNTER — Telehealth (HOSPITAL_COMMUNITY): Payer: Self-pay | Admitting: Surgery

## 2021-10-30 MED ORDER — LEVOTHYROXINE SODIUM 50 MCG PO TABS: 75.0000 ug | ORAL_TABLET | Freq: Every day | ORAL | 2 refills | Status: DC

## 2021-10-30 NOTE — Telephone Encounter (Signed)
-----   Message from Larey Dresser, MD sent at 10/27/2021  3:40 PM EDT ----- ?TSH high.  Increase levoxyl to 75 mcg daily, check TSH 1 month.  ?

## 2021-10-30 NOTE — Telephone Encounter (Signed)
Patient called to review results and recommendations per provider.  He was instructed to increase Levothyroxine to 10mg daily and will return on 11/17/2021 for an appt.  Need for TSH that day added to appt notes.  Medication list updated in [facility]. ?

## 2021-11-05 ENCOUNTER — Ambulatory Visit (INDEPENDENT_AMBULATORY_CARE_PROVIDER_SITE_OTHER): Payer: Medicare Other

## 2021-11-05 DIAGNOSIS — Z5181 Encounter for therapeutic drug level monitoring: Secondary | ICD-10-CM | POA: Diagnosis not present

## 2021-11-05 DIAGNOSIS — I4819 Other persistent atrial fibrillation: Secondary | ICD-10-CM | POA: Diagnosis not present

## 2021-11-05 DIAGNOSIS — Z952 Presence of prosthetic heart valve: Secondary | ICD-10-CM | POA: Diagnosis not present

## 2021-11-05 LAB — POCT INR: INR: 3.6 — AB (ref 2.0–3.0)

## 2021-11-05 NOTE — Patient Instructions (Signed)
Description   ?Spoke with pt and instructed to eat a serving of greens today and continue taking warfarin 1/2 tablet daily except for 1 tablet on Mondays, Wednesdays, and Fridays. Recheck INR in 2 weeks (per request). [facility] Clinic [phone].  ? ?AMIO DOSE INCREASED TO '400MG'$  ON SAT & SUN ALL OTHER DAYS STAY AT '200MG'$ .   ?Pt's cell phone [phone] in case difficulty reaching pt on home phone. ?  ?   ?

## 2021-11-06 ENCOUNTER — Ambulatory Visit (HOSPITAL_COMMUNITY)
Admission: RE | Admit: 2021-11-06 | Discharge: 2021-11-06 | Disposition: A | Discharge status: Home / Self Care | Payer: Medicare Other | Source: Ambulatory Visit | Attending: Internal Medicine | Admitting: Internal Medicine

## 2021-11-06 DIAGNOSIS — I5022 Chronic systolic (congestive) heart failure: Secondary | ICD-10-CM | POA: Insufficient documentation

## 2021-11-06 LAB — BASIC METABOLIC PANEL
Anion gap: 7 (ref 5–15)
BUN: 35 mg/dL — ABNORMAL HIGH (ref 8–23)
CO2: 26 mmol/L (ref 22–32)
Calcium: 8.8 mg/dL — ABNORMAL LOW (ref 8.9–10.3)
Chloride: 102 mmol/L (ref 98–111)
Creatinine, Ser: 1.7 mg/dL — ABNORMAL HIGH (ref 0.61–1.24)
GFR, Estimated: 41 mL/min — ABNORMAL LOW (ref >60)
Glucose, Bld: 107 mg/dL — ABNORMAL HIGH (ref 70–99)
Potassium: 4.2 mmol/L (ref 3.5–5.1)
Sodium: 135 mmol/L (ref 135–145)

## 2021-11-09 ENCOUNTER — Ambulatory Visit (INDEPENDENT_AMBULATORY_CARE_PROVIDER_SITE_OTHER): Payer: Medicare Other

## 2021-11-09 ENCOUNTER — Telehealth: Payer: Self-pay | Admitting: Cardiology

## 2021-11-09 DIAGNOSIS — I5022 Chronic systolic (congestive) heart failure: Secondary | ICD-10-CM

## 2021-11-09 DIAGNOSIS — Z95810 Presence of automatic (implantable) cardiac defibrillator: Secondary | ICD-10-CM

## 2021-11-09 NOTE — Telephone Encounter (Signed)
Pt called with suddenly not feeling well this afternoon, had been doing well before today.  His BP was 113.67 and P 143, he rested and P to 67 he feels normal now.  His INR last week was 3.6.  he has had PAF and hx of V tach ablation.  He will send in monitor strip from device now. K+ 4.2 on 11/06/21 Cr 1.70 ? ?Sonia Baller please check with remote device check tomorrow.    ?

## 2021-11-10 ENCOUNTER — Other Ambulatory Visit: Payer: Self-pay | Admitting: Cardiology

## 2021-11-10 NOTE — Telephone Encounter (Signed)
No VT episodes.  He is in Afib 100% of the time.  His V rates look well controlled. ?Please let me know if you need any more information from his device report. ? ? ? ? ? ? ?

## 2021-11-11 ENCOUNTER — Encounter: Payer: Self-pay | Admitting: Internal Medicine

## 2021-11-12 ENCOUNTER — Ambulatory Visit (INDEPENDENT_AMBULATORY_CARE_PROVIDER_SITE_OTHER): Payer: Medicare Other | Admitting: Cardiology

## 2021-11-12 VITALS — BP 90/58 | HR 138 | Ht 73.5 in | Wt 198.0 lb

## 2021-11-12 DIAGNOSIS — I5022 Chronic systolic (congestive) heart failure: Secondary | ICD-10-CM | POA: Diagnosis not present

## 2021-11-12 DIAGNOSIS — Z95810 Presence of automatic (implantable) cardiac defibrillator: Secondary | ICD-10-CM | POA: Diagnosis not present

## 2021-11-12 DIAGNOSIS — Z7901 Long term (current) use of anticoagulants: Secondary | ICD-10-CM | POA: Diagnosis not present

## 2021-11-12 DIAGNOSIS — Z952 Presence of prosthetic heart valve: Secondary | ICD-10-CM | POA: Diagnosis not present

## 2021-11-12 DIAGNOSIS — I4819 Other persistent atrial fibrillation: Secondary | ICD-10-CM

## 2021-11-12 DIAGNOSIS — I472 Ventricular tachycardia, unspecified: Secondary | ICD-10-CM

## 2021-11-12 NOTE — Patient Instructions (Signed)
Medication Instructions:  ? ?Your physician recommends that you continue on your current medications as directed. Please refer to the Current Medication list given to you today. ? ?*If you need a refill on your cardiac medications before your next appointment, please call your pharmacy* ? ? ?Follow-Up: ? ?You will get a call back from [facility] Dr. Mike Gip, to move your follow-up appointment with Dr. Lovena Le  to a sooner date ? ? ? ? ? ?Important Information About Sugar ? ? ? ? ? ? ?

## 2021-11-12 NOTE — Progress Notes (Signed)
?Cardiology Office Note:   ? ?Date:  11/12/2021  ? ?ID:  Austin [name], DOB [birth year], MRN [id number] ? ?PCP:  Lajean Manes, MD ?  ?[facility] Providers ?Cardiologist:  Sinclair Grooms, MD ?Electrophysiologist:  [name], MD {  ? ?Referring MD: Lajean Manes, MD  ? ?No chief complaint on file. ? ? ?History of Present Illness:   ? ?Austin [name] is a 81 y.o. 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 ? ?He messaged the office 11/11/2021 and reported feeling terrible over 3 days prior. His heart rate was persistently high, up to 141 bpm. His BP readings were 81/60 at 10:45, 93/65 at 11:00, and then 86/138 at 11:20 bpm.  ? ?During his nursing visit he was found to be in a wide complex tachycardia with HR 139. Device interrogation revealed both atrial flutter and ventricular tachycardia in the setting of known poor AV conduction. HR below threshold of ICD device therapies.  ? ?Today, the patient states that he has been feeling terrible, and his heart rate has been fast since Monday. States he has been feeling short of breath with minimal activity and has been lightheaded with his low blood pressures.  ? ?He remains compliant with his warfarin. His latest INR is reportedly 3.6.  ? ?We discussed the option of being sent to the ER for sedation and shock vs in office attempt for ATP therapies with the risk of rapid VT and ICD shocking. He was amenable to trial ATP pacing in the office with Dr. Caryl Comes. Ventricular ATP successful with first attempt and patient converted out of VT. Remained in Aflutter despite multiple A-ATP attempts.  ? ?He denies any chest pain, or  peripheral edema. No lightheadedness, headaches, syncope, orthopnea, or PND. ? ? ? ?Past Medical History:  ?Diagnosis Date  ? AICD (automatic cardioverter/defibrillator) present   ? Atrial fibrillation ([place])   ? 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   ? cx by infection, explantation11/12 & reimplant 1/13  ? Conductive hearing loss   ? Intraspinal abscess   ? Mitral valve insufficiency and aortic valve insufficiency   ? s/p MVR mechanical  ? Nonischemic cardiomyopathy ([place])   ? Psychosexual dysfunction with inhibited sexual excitement   ? S/P mitral valve replacement   ? Syncope and collapse   ? Unspecified sleep apnea   ? last sleep study 11/07  ? Ventricular tachycardia ([place])   ? VT (ventricular tachycardia) ([place])   ? ? ?Past Surgical History:  ?Procedure Laterality Date  ? BIV ICD GENERATOR CHANGEOUT N/A 05/04/2017  ? Procedure: BiV ICD Generator Changeout;  Surgeon: Evans Lance, MD;  Location: [facility];  Service: Cardiovascular;  Laterality: N/A;  ? CARDIAC CATHETERIZATION  04/24/2002  ? CARDIAC VALVE REPLACEMENT    ? CARDIOVERSION  03/09/2012  ? Procedure: CARDIOVERSION;  Surgeon: Evans Lance, MD;  Location: [place];  Service: Cardiovascular;  Laterality: N/A;  ? CARDIOVERSION N/A 11/22/2013  ? Procedure: CARDIOVERSION;  Surgeon: Sanda Klein, MD;  Location: [facility];  Service: Cardiovascular;  Laterality: N/A;  ? dental implants    ? ELECTROPHYSIOLOGIC STUDY N/A 09/15/2015  ? Procedure: V Tach Ablation;  Surgeon: Evans Lance, MD;  Location:  [facility];  Service: Cardiovascular;  Laterality: N/A;  ? Evacution of epidural lumbar epidural abscess  1999  ? INSERT / REPLACE / REMOVE PACEMAKER  11/2008  ? MITRAL VALVE REPLACEMENT    ? w [email or web address]  ? PERMANENT PACEMAKER INSERTION N/A 08/05/2011  ? Procedure: PERMANENT PACEMAKER INSERTION;  Surgeon: Evans Lance, MD;  Location: [facility] C[facility];  Service: Cardiovascular;  Laterality: N/A;  ? RIGHT HEART CATH N/A  02/26/2021  ? Procedure: RIGHT HEART CATH;  Surgeon: Larey Dresser, MD;  Location: [facility];  Service: Cardiovascular;  Laterality: N/A;  ? THYROIDECTOMY    ? TONSILLECTOMY    ? VALVE REPLACEMENT  2000  ? ? ?Current Medications: ?Current Meds  ?Medication Sig  ? acetaminophen (TYLENOL) 500 MG tablet Take 1,000 mg by mouth every 6 (six) hours as needed (pain).  ? amiodarone (PACERONE) 200 MG tablet Take 0.5 tablets (100 mg total) by mouth daily.  ? amoxicillin (AMOXIL) 500 MG capsule TAKE 4 CAPSULES BY MOUTH 30 MINUTES PRIOR TO DENTAL WORK  ? furosemide (LASIX) 20 MG tablet Take 2 tablets (40 mg total) by mouth 2 (two) times daily.  ? levothyroxine (SYNTHROID) 50 MCG tablet Take 1.5 tablets (75 mcg total) by mouth daily before breakfast.  ? melatonin 5 MG TABS Take 5 mg by mouth at bedtime.  ? metoprolol succinate (TOPROL-XL) 25 MG 24 hr tablet Take 0.5 tablets (12.5 mg total) by mouth daily. Take with or immediately following a meal.  ? Multiple Vitamin (MULTIVITAMIN WITH MINERALS) TABS tablet Take 1 tablet by mouth in the morning.  ? nitroGLYCERIN (NITROSTAT) 0.4 MG SL tablet Place 1 tablet (0.4 mg total) under the tongue every 5 (five) minutes x 3 doses as needed for chest pain.  ? spironolactone (ALDACTONE) 25 MG tablet Take 0.5 tablets (12.5 mg total) by mouth daily.  ? valsartan (DIOVAN) 40 MG tablet Take 0.5 tablets (20 mg total) by mouth 2 (two) times daily.  ? Vericiguat (VERQUVO) 5 MG TABS Take 1 tablet (5 mg total) by mouth daily.  ? warfarin (COUMADIN) 5 MG tablet TAKE AS DIRECTED BY THE COUMADIN CLINIC  ?  ? ?Allergies:   Patient has no known allergies.  ? ?Social History  ? ?Socioeconomic History  ? Marital status: Married  ?  Spouse name: Not on file  ? Number of children: 1  ? Years of education: 82  ? Highest education level: Master's degree (e.g., MA, MS, MEng, MEd, MSW, MBA)  ?Occupational History  ? Occupation: retired from Engineer, manufacturing systems  ?Tobacco Use  ? Smoking status: Never  ?  Smokeless tobacco: Never  ?Vaping Use  ? Vaping Use: Never used  ?Substance and Sexual Activity  ? Alcohol use: Yes  ?  Alcohol/week: 0.0 standard drinks  ?  Comment: occasionally  ? Drug use: No  ? Sexual activity: Not on file  ?Other Topics Concern  ? Not on file  ?Social History Narrative  ? Lives with wife in a one story home.  Has one daughter.  Retired.  Education: Masters.  ? ?Social Determinants of Health  ? ?Financial Resource Strain: Not on file  ?Food Insecurity: Not on file  ?Transportation Needs: Not on file  ?Physical Activity: Not on file  ?Stress: Not on file  ?Social Connections: Not on file  ?  ? ?Family History: ?The patient's family history includes Heart disease in his father and mother; Heart failure in his father and mother. ? ?ROS:   ?  Review of Systems  ?Constitutional:  Negative for chills and fever.  ?HENT:  Negative for nosebleeds.   ?Eyes:  Negative for double vision.  ?Respiratory:  Positive for shortness of breath. Negative for cough and sputum production.   ?Cardiovascular:  Positive for palpitations. Negative for chest pain, orthopnea, claudication, leg swelling and PND.  ?Gastrointestinal:  Negative for blood in stool and vomiting.  ?Genitourinary:  Negative for hematuria.  ?Musculoskeletal:  Negative for falls.  ?Neurological:  Negative for tremors and headaches.  ?Endo/Heme/Allergies:  Negative for polydipsia.  ?Psychiatric/Behavioral:  Negative for depression and suicidal ideas.   ? ? ?EKGs/Labs/Other Studies Reviewed:   ? ?The following studies were reviewed today: ? ?Echo 10/27/2021: ? 1. Left ventricular ejection fraction, by estimation, is 20 to 25%. The  ?left ventricle has severely decreased function. The left ventricle  ?demonstrates global hypokinesis with septal-lateral dyssynchrony  ?consistent with RV pacing. The left ventricular  ?internal cavity size was mildly dilated. Left ventricular diastolic  ?parameters are indeterminate.  ? 2. Left atrial size was severely  dilated.  ? 3. Right atrial size was moderately dilated.  ? 4. Mechanical mitral valve. Mean gradient 4 mmHg, no significant  ?regurgitation noted. Normal function.  ? 5. The aortic valve is tricuspid. There is moder

## 2021-11-13 ENCOUNTER — Telehealth: Payer: Self-pay

## 2021-11-13 ENCOUNTER — Other Ambulatory Visit (INDEPENDENT_AMBULATORY_CARE_PROVIDER_SITE_OTHER): Payer: Medicare Other

## 2021-11-13 DIAGNOSIS — I472 Ventricular tachycardia, unspecified: Secondary | ICD-10-CM

## 2021-11-13 NOTE — Telephone Encounter (Signed)
Remote ICM transmission received.  Attempted call to patient regarding ICM remote transmission and no answer or voice mail option.  

## 2021-11-13 NOTE — Progress Notes (Signed)
EPIC Encounter for ICM Monitoring ? ?Patient Name: Austin [name] is a 81 y.o. male ?Date: 11/13/2021 ?Primary Care Physican: Lajean Manes, MD ?Primary Cardiologist: [place] ?Electrophysiologist: Lovena Le ?09/02/2021 Weight: 194.7 lbs ?  ?Since 27-Oct-2021 ?AT/AF 23 ?Time in AT/AF 24.0 hr/day (99.9%) ?Longest AT/AF 4 days ?  ?       Attempted call to patient and unable to reach.  Transmission reviewed.  Per 11/12/21 epic note, pt seen in office for symptoms of SOB, lightheadedness, high heart rate, low BP readings and overall note feeling well. ?  ?Optivol Thoracic impedance suggesting normal fluid levels.  Per epic note 11/12/2021: Office interrogation showed atrial flutter and ventricular tachycardia in setting of known poor AV conduction. HR was below threshold of ICD device therapies.   He had successful ATP pacing in office with Dr Caryl Comes and converted out of VT but did remain in Atrial flutter.  ?  ?Prescribed:  ?Furosemide 20 mg Take 2 tablets (40 mg total) by mouth every morning AND 1 tablet (20 mg total) every evening. ?  ?Labs: ?08/26/2021 Creatinine 1.54, BUN 24, Potassium 4.1, Sodium 137, GFR 46 ?07/31/2021 Creatinine 1.15, BUN 15, Potassium 4.3, Sodium 139, GFR 65 ?07/30/2021 Creatinine 1.28, BUN 21, Potassium 3.7, Sodium 141, GFR 57 ?A complete set of results can be found in Results Review. ?  ?Recommendations:  Unable to reach.   ?  ?Follow-up plan: ICM clinic phone appointment on 12/14/2021.   91 day device clinic remote transmission 12/23/2021.    ?  ?EP/Cardiology Office Visits:  11/18/2021 with Oda Kilts, [place].   12/15/2021 with Dr Lovena Le.  Recall 10/10/2022 with Dr Tamala Julian. ?  ?Copy of ICM check sent to Dr. Lovena Le.    ? ?3 month ICM trend: 11/13/2021. ? ? ? ?12-14 Month ICM trend:  ? ? ? ?[name], RN ?11/13/2021 ?1:32 PM ? ?

## 2021-11-13 NOTE — Progress Notes (Signed)
Spoke with patient and heart failure questions reviewed.  Pt asymptomatic for fluid accumulation.  He is feeling much better after being seen in the office yesterday.  He reports once he gets back in a better rhythm he feels better.  No changes and encouraged to call if experiencing any fluid symptoms. ?

## 2021-11-16 NOTE — Progress Notes (Signed)
?PCP: Lajean Manes, MD ?Cardiology: Dr. Tamala Julian ?EP: Dr. Lovena Le ?HF Cardiology: Dr. Aundra Dubin ? ?Austin [name] 81 y.o. with history of mechanical mitral valve s/p MV endocarditis, VT, nonischemic cardiomyopathy, complete heart block was referred by Dr. Tamala Julian for evaluation of CHF.  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 He subsequently developed a presumed nonischemic cardiomyopathy.  This was complicated by VT, he had a VT ablation in 2017 and had a Medtronic CRT-D device implanted. This had to be removed due to infection in 11/12 and was reimplanted in 1/13. Last echo in 5/22 showed EF 30% with normal RV, mechanical MV stable with mean gradient 3 mmHg.  He has been in atrial fibrillation persistently since late 2021.  He is on amiodarone and has been cardioverted in the past. His LV lead is not functional.  He has complete heart block and is persistently RV pacing.  ? ?Had Pace 02/26/21 RA 4, PA 32/12, PCWP 11, CO 4.6, CI 2.2. Normal filling pressures and slightly low cardiac output.  ? ?Digoxin was stopped due to elevated level.  ? ?He saw Dr. Lovena Le in 9/22 and was in slow VT in the office.  He was paced out of VT and amiodarone was increased to 200 mg daily.  ? ?He saw Dr. Lovena Le, and placement of a left bundle or LV lead was decided to be too risky a procedure.  He was referred to Dr. Oneida Alar with the hope that he could have a VT ablation and get off amiodarone, which is likely playing a role in his symptoms.    ? ?S/p VT ablation with Dr. Oneida Alar 1/23. ? ?Echo 4/23 EF 20-25% with diffuse hypokinesis and septal-lateral dyssynchrony, mildly decreased RV systolic function, mechanical MV with normal function, dilated IVC.  ? ?Follow up 4/23, mildly volume overloaded, Lasix increased to 60/40 x 3 days then 40 bid, started spiro and decreased amio to 100 (felt this was making him feel bad/fatigued).  ? ?Seen 11/12/21 by Dr. Johney Frame, ECG showed VT, associated with  hypotension. Interrogation showed wide complex tachycardia 130's, AFL and VT. Dr. Caryl Comes able to ATP pace out of VT, remained in AFL. Arranged for EP follow up. ? ?Today he returns for HF follow up with wife. Overall feeling fine, not back to baseline after VT set back last week. Feels better on amiodarone 100 mg daily, hopes to be able to stop completely. Felt good on Lasix 40 bid, weight came down to 193 and he decreased his Lasix by 60 daily. Occasional dizziness, no falls. He has dyspnea with lifting or walking to the mailbox. Denies CP, palpitations, edema, or PND/Orthopnea. Appetite ok. No fever or chills. Weight at home 193 pounds. Taking all medications. Moving to [place] this week.  ? ?Medtronic device interrogation: remains in AF, 88.2% V-pacing, 0.7 hours/day activity. ? ?ECG (personally reviewed): none ordered today. ? ?Labs (7/22): K 4.6, creatinine 1.3 ?Labs (8/22): K 4.4, creatinine 1.23 ?Labs (10/22): K 3.9, creatinine 1.36 ?Labs (12/22): K 4.2, creatinine 1.39 ?Labs (1/23): K 4.3, creatinine 1.15 ?Labs (2/23): TSH 10, K 4.1, creatinine 1.54 ?Labs (4/23): K 4.2, creatinine 1.7, normal LFTs, TSH high 13 ? ?PMH: ?1. 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 ?2. H/o VT: VT ablation in 2017.  ?- Redo VT ablation in 1/23 with Dr. Oneida Alar ?3. Atrial fibrillation/atypical flutter: Persistent since 2021.  Failed amiodarone.  ?4. PAD: Tibial occlusive disease.  ?5. 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  ?6. Hypothyroidism.  ?  7. Chronic systolic CHF: Presumed nonischemic cardiomyopathy.  Medtronic CRT-D device, LV lead is nonfunctional.  ?- Echo (2012): EF 45% ?- Echo (1/17): EF 30-35% ?- Echo (6/21): EF 15-20% ?- Echo (11/21): EF 30-35%  ?- Echo (5/22): EF 30%, normal RV, mechanical mitral valve mean gradient 3 mmHg.  ?- RHC (8/22): RA mean 4, RV 33/4,PA 32/12 mean 21, PCWP mean 11, Oxygen saturations:PA 71% ?- Echo (4/23): EF 20-25% with diffuse hypokinesis and septal-lateral  dyssynchrony, mildly decreased RV systolic function, mechanical MV with normal function, dilated IVC.  ?AO 98%, Cardiac Output (Fick) 4.66 Cardiac Index (Fick) 2.18 ? ?Social History  ? ?Socioeconomic History  ? Marital status: Married  ?  Spouse name: Not on file  ? Number of children: 1  ? Years of education: 57  ? Highest education level: Master's degree (e.g., MA, MS, MEng, MEd, MSW, MBA)  ?Occupational History  ? Occupation: retired from Engineer, manufacturing systems  ?Tobacco Use  ? Smoking status: Never  ? Smokeless tobacco: Never  ?Vaping Use  ? Vaping Use: Never used  ?Substance and Sexual Activity  ? Alcohol use: Yes  ?  Alcohol/week: 0.0 standard drinks  ?  Comment: occasionally  ? Drug use: No  ? Sexual activity: Not on file  ?Other Topics Concern  ? Not on file  ?Social History Narrative  ? Lives with wife in a one story home.  Has one daughter.  Retired.  Education: Masters.  ? ?Social Determinants of Health  ? ?Financial Resource Strain: Not on file  ?Food Insecurity: Not on file  ?Transportation Needs: Not on file  ?Physical Activity: Not on file  ?Stress: Not on file  ?Social Connections: Not on file  ?Intimate Partner Violence: Not on file  ? ?Family History  ?Problem Relation Age of Onset  ? Heart disease Mother   ? Heart failure Mother   ? Heart disease Father   ? Heart failure Father   ? ?ROS: All systems reviewed and negative except as per HPI.  ? ?Current Outpatient Medications  ?Medication Sig Dispense Refill  ? acetaminophen (TYLENOL) 500 MG tablet Take 1,000 mg by mouth every 6 (six) hours as needed (pain).    ? amiodarone (PACERONE) 200 MG tablet Take 0.5 tablets (100 mg total) by mouth daily. 15 tablet 5  ? amoxicillin (AMOXIL) 500 MG capsule TAKE 4 CAPSULES BY MOUTH 30 MINUTES PRIOR TO DENTAL WORK 4 capsule 2  ? furosemide (LASIX) 20 MG tablet Take by mouth. 40 mg in the AM and 20 mg PM    ? levothyroxine (SYNTHROID) 50 MCG tablet Take 1.5 tablets (75 mcg total) by mouth daily before breakfast. 90  tablet 2  ? melatonin 5 MG TABS Take 5 mg by mouth at bedtime.    ? metoprolol succinate (TOPROL-XL) 25 MG 24 hr tablet Take 0.5 tablets (12.5 mg total) by mouth daily. Take with or immediately following a meal. 45 tablet 3  ? Multiple Vitamin (MULTIVITAMIN WITH MINERALS) TABS tablet Take 1 tablet by mouth in the morning.    ? spironolactone (ALDACTONE) 25 MG tablet Take 0.5 tablets (12.5 mg total) by mouth daily. 90 tablet 3  ? valsartan (DIOVAN) 40 MG tablet Take 0.5 tablets (20 mg total) by mouth 2 (two) times daily. 30 tablet 11  ? Vericiguat (VERQUVO) 5 MG TABS Take 1 tablet (5 mg total) by mouth daily. 30 tablet 6  ? warfarin (COUMADIN) 5 MG tablet TAKE AS DIRECTED BY THE COUMADIN CLINIC 80 tablet 1  ?  nitroGLYCERIN (NITROSTAT) 0.4 MG SL tablet Place 1 tablet (0.4 mg total) under the tongue every 5 (five) minutes x 3 doses as needed for chest pain. (Patient not taking: Reported on 11/17/2021) 25 tablet 2  ? ?No current facility-administered medications for this encounter.  ? ?BP 124/80   Pulse 69   Ht 6' 1.5" (1.867 m)   Wt 87.5 kg (193 lb)   SpO2 99%   BMI 25.12 kg/m?  ? ?Wt Readings from Last 3 Encounters:  ?11/17/21 87.5 kg (193 lb)  ?11/12/21 89.8 kg (198 lb)  ?10/27/21 90.6 kg (199 lb 12.8 oz)  ? ?Physical Exam: ?General:  NAD. No resp difficulty ?HEENT: Normal ?Neck: Supple. No JVD. Carotids 2+ bilat; no bruits. No lymphadenopathy or thryomegaly appreciated. ?Cor: PMI nondisplaced. Regular rate & rhythm. No rubs, gallops or murmurs. + mechanical S1 ?Lungs: Clear ?Abdomen: Soft, nontender, nondistended. No hepatosplenomegaly. No bruits or masses. Good bowel sounds. ?Extremities: No cyanosis, clubbing, rash, trace pedal edema ?Neuro: Alert & oriented x 3, cranial nerves grossly intact. Moves all 4 extremities w/o difficulty. Affect pleasant. ? ?Assessment/Plan: ?1. Chronic systolic CHF: Long-standing, suspected nonischemic cardiomyopathy.  Echo today showed  EF 20-25% with diffuse hypokinesis and  septal-lateral dyssynchrony, mildly decreased RV systolic function, mechanical MV with normal function, dilated IVC.  He has a Medtronic CRT-D device present, but the LV lead is not functional.  He has complete heart bl

## 2021-11-16 NOTE — Addendum Note (Signed)
Addended by: Jeremy Johann on: 11/16/2021 01:04 PM ? ? Modules accepted: Orders ? ?

## 2021-11-17 ENCOUNTER — Ambulatory Visit (HOSPITAL_COMMUNITY)
Admission: RE | Admit: 2021-11-17 | Discharge: 2021-11-17 | Disposition: A | Discharge status: Home / Self Care | Payer: Medicare Other | Source: Ambulatory Visit | Attending: Family Medicine | Admitting: Family Medicine

## 2021-11-17 ENCOUNTER — Encounter (HOSPITAL_COMMUNITY): Payer: Self-pay

## 2021-11-17 VITALS — BP 124/80 | HR 69 | Ht 73.5 in | Wt 193.0 lb

## 2021-11-17 DIAGNOSIS — Z7901 Long term (current) use of anticoagulants: Secondary | ICD-10-CM | POA: Diagnosis not present

## 2021-11-17 DIAGNOSIS — E039 Hypothyroidism, unspecified: Secondary | ICD-10-CM | POA: Diagnosis not present

## 2021-11-17 DIAGNOSIS — I472 Ventricular tachycardia, unspecified: Secondary | ICD-10-CM

## 2021-11-17 DIAGNOSIS — I5022 Chronic systolic (congestive) heart failure: Secondary | ICD-10-CM | POA: Diagnosis not present

## 2021-11-17 DIAGNOSIS — I428 Other cardiomyopathies: Secondary | ICD-10-CM | POA: Insufficient documentation

## 2021-11-17 DIAGNOSIS — I4819 Other persistent atrial fibrillation: Secondary | ICD-10-CM | POA: Diagnosis not present

## 2021-11-17 DIAGNOSIS — Z79899 Other long term (current) drug therapy: Secondary | ICD-10-CM | POA: Diagnosis not present

## 2021-11-17 DIAGNOSIS — I442 Atrioventricular block, complete: Secondary | ICD-10-CM

## 2021-11-17 DIAGNOSIS — R42 Dizziness and giddiness: Secondary | ICD-10-CM | POA: Insufficient documentation

## 2021-11-17 DIAGNOSIS — Z950 Presence of cardiac pacemaker: Secondary | ICD-10-CM | POA: Insufficient documentation

## 2021-11-17 DIAGNOSIS — Z8679 Personal history of other diseases of the circulatory system: Secondary | ICD-10-CM | POA: Diagnosis not present

## 2021-11-17 DIAGNOSIS — R0600 Dyspnea, unspecified: Secondary | ICD-10-CM | POA: Insufficient documentation

## 2021-11-17 DIAGNOSIS — Z952 Presence of prosthetic heart valve: Secondary | ICD-10-CM

## 2021-11-17 LAB — MAGNESIUM: Magnesium: 2.6 mg/dL — ABNORMAL HIGH (ref 1.7–2.4)

## 2021-11-17 LAB — BASIC METABOLIC PANEL
Anion gap: 11 (ref 5–15)
BUN: 35 mg/dL — ABNORMAL HIGH (ref 8–23)
CO2: 24 mmol/L (ref 22–32)
Calcium: 8.7 mg/dL — ABNORMAL LOW (ref 8.9–10.3)
Chloride: 99 mmol/L (ref 98–111)
Creatinine, Ser: 1.58 mg/dL — ABNORMAL HIGH (ref 0.61–1.24)
GFR, Estimated: 44 mL/min — ABNORMAL LOW (ref >60)
Glucose, Bld: 98 mg/dL (ref 70–99)
Potassium: 4.4 mmol/L (ref 3.5–5.1)
Sodium: 134 mmol/L — ABNORMAL LOW (ref 135–145)

## 2021-11-17 NOTE — Patient Instructions (Addendum)
Thank you for coming in today ? ?Labs were done today, if any labs are abnormal the clinic will call you ?No news is good news ? ?Your physician recommends that you schedule a follow-up appointment in:  ?6 weeks in clinic ?12 weeks with Dr. Aundra Dubin  ? ?At the [facility], you and your health needs are our priority. As part of our continuing mission to provide you with exceptional heart care, we have created designated Provider Care Teams. These Care Teams include your primary Cardiologist (physician) and Advanced Practice Providers (APPs- Physician Assistants and Nurse Practitioners) who all work together to provide you with the care you need, when you need it.  ? ?You may see any of the following providers on your designated Care Team at your next follow up: ?Dr Glori Bickers ?Dr Loralie Champagne ?Darrick Grinder, NP ?Lyda Jester, PA ?Jessica Milford,NP ?Marlyce Huge, PA ?Audry Riles, PharmD ? ? ?Please be sure to bring in all your medications bottles to every appointment.  ? ?If you have any questions or concerns before your next appointment please send [place] a message through [place] or call our office at [phone].   ? ?TO LEAVE A MESSAGE FOR THE NURSE SELECT OPTION 2, PLEASE LEAVE A MESSAGE INCLUDING: ?YOUR NAME ?DATE OF BIRTH ?CALL BACK NUMBER ?REASON FOR CALL**this is important as we prioritize the call backs ? ?YOU WILL RECEIVE A CALL BACK THE SAME DAY AS LONG AS YOU CALL BEFORE 4:00 PM ? ?

## 2021-11-18 ENCOUNTER — Ambulatory Visit: Payer: Medicare Other | Admitting: Student

## 2021-11-18 ENCOUNTER — Encounter: Payer: Self-pay | Admitting: Student

## 2021-11-18 VITALS — BP 96/64 | HR 69 | Ht 74.0 in | Wt 198.6 lb

## 2021-11-18 DIAGNOSIS — Z952 Presence of prosthetic heart valve: Secondary | ICD-10-CM | POA: Diagnosis not present

## 2021-11-18 DIAGNOSIS — I4819 Other persistent atrial fibrillation: Secondary | ICD-10-CM

## 2021-11-18 DIAGNOSIS — I472 Ventricular tachycardia, unspecified: Secondary | ICD-10-CM

## 2021-11-18 DIAGNOSIS — I5022 Chronic systolic (congestive) heart failure: Secondary | ICD-10-CM

## 2021-11-18 LAB — CUP PACEART INCLINIC DEVICE CHECK
Battery Remaining Longevity: 25 mo
Battery Voltage: 2.93 V
Brady Statistic RA Percent Paced: 0.71 %
Brady Statistic RV Percent Paced: 91.25 %
Date Time Interrogation Session: 20230426114359
HighPow Impedance: 75 Ohm
Implantable Lead Implant Date: 20130110
Implantable Lead Implant Date: 20130110
Implantable Lead Location: 753859
Implantable Lead Location: 753860
Implantable Lead Model: 5076
Implantable Lead Model: 6935
Implantable Pulse Generator Implant Date: 20181010
Lead Channel Impedance Value: 361 Ohm
Lead Channel Impedance Value: 399 Ohm
Lead Channel Impedance Value: 399 Ohm
Lead Channel Pacing Threshold Amplitude: 0.875 V
Lead Channel Pacing Threshold Pulse Width: 0.4 ms
Lead Channel Sensing Intrinsic Amplitude: 0.375 mV
Lead Channel Sensing Intrinsic Amplitude: 0.375 mV
Lead Channel Sensing Intrinsic Amplitude: 14.75 mV
Lead Channel Sensing Intrinsic Amplitude: 14.75 mV
Lead Channel Setting Pacing Amplitude: 2.5 V
Lead Channel Setting Pacing Amplitude: 3.5 V
Lead Channel Setting Pacing Pulse Width: 0.4 ms
Lead Channel Setting Sensing Sensitivity: 0.3 mV

## 2021-11-18 MED ORDER — METOPROLOL SUCCINATE ER 25 MG PO TB24: 12.5000 mg | ORAL_TABLET | Freq: Every day | ORAL | 3 refills | Status: DC

## 2021-11-18 NOTE — Patient Instructions (Signed)
Medication Instructions:  ?Your physician has recommended you make the following change in your medication:  ? ?Take your Metoprolol at bedtime ? ?*If you need a refill on your cardiac medications before your next appointment, please call your pharmacy* ? ? ?Lab Work: ?None ?If you have labs (blood work) drawn today and your tests are completely normal, you will receive your results only by: ?MyChart Message (if you have MyChart) OR ?A paper copy in the mail ?If you have any lab test that is abnormal or we need to change your treatment, we will call you to review the results. ? ? ?Follow-Up: ?At [facility], you and your health needs are our priority.  As part of our continuing mission to provide you with exceptional heart care, we have created designated Provider Care Teams.  These Care Teams include your primary Cardiologist (physician) and Advanced Practice Providers (APPs -  Physician Assistants and Nurse Practitioners) who all work together to provide you with the care you need, when you need it. ? ? ?Your next appointment:   ?As scheduled ?

## 2021-11-18 NOTE — Progress Notes (Signed)
? ? ?Electrophysiology Office Note ?Date: 11/18/2021 ? ?ID:  MANVILLE RICO, DOB [birth year], MRN [id number] ? ?PCP: Lajean Manes, MD ?Primary Cardiologist: Sinclair Grooms, MD ?Electrophysiologist: [name], MD  ? ?CC: Routine ICD follow-up ? ?CURRY SEEFELDT is a 81 y.o. male seen today for [name], MD for acute visit due to recent VT on device . ? ?S/p Re-do VT ablation 07/2021 by Dr. Noralee Stain ? ?Seen by Dr. Johney Frame 11/12/21 and found to be in VT with hypotension and SOB. Paced out in office by Dr. Caryl Comes. VT detection lowered ? ?  Since last being seen in our clinic the patient reports doing better. He had an episode of dizziness this am and was worried it was starting again. Overall feels 6-7 / 10 as far as energy level and fatigue go. He feels amiodarone is primary contributor he denies chest pain, palpitations, dyspnea, PND, orthopnea, nausea, vomiting, dizziness, syncope, edema, weight gain, or early satiety. He has not had ICD shocks.  ? ?Device History: ?MDT dual chamber ICD implanted 08/05/2011, gen change 05/04/2017 ?+ hx of VT ?Nov 2021, VT 160's required external shock ?  ?AAD ?Amiodarone goes back to 2010 ? ?Past Medical History:  ?Diagnosis Date  ? AICD (automatic cardioverter/defibrillator) present   ? Atrial fibrillation ([place])   ? 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   ? cx by infection, explantation11/12 & reimplant 1/13  ? Conductive hearing loss   ? Intraspinal abscess   ? Mitral valve insufficiency and aortic valve insufficiency   ? s/p MVR mechanical  ? Nonischemic cardiomyopathy ([place])   ? Psychosexual dysfunction with inhibited sexual excitement   ? S/P mitral valve replacement   ? Syncope and collapse   ? Unspecified sleep apnea   ? last sleep study 11/07  ? Ventricular tachycardia ([place])   ? VT (ventricular tachycardia) ([place])   ? ?Past Surgical History:  ?Procedure Laterality Date  ? BIV ICD GENERATOR CHANGEOUT N/A 05/04/2017  ? Procedure: BiV ICD  Generator Changeout;  Surgeon: Evans Lance, MD;  Location: [place] [facility];  Service: Cardiovascular;  Laterality: N/A;  ? CARDIAC CATHETERIZATION  04/24/2002  ? CARDIAC VALVE REPLACEMENT    ? CARDIOVERSION  03/09/2012  ? Procedure: CARDIOVERSION;  Surgeon: Evans Lance, MD;  Location: [place];  Service: Cardiovascular;  Laterality: N/A;  ? CARDIOVERSION N/A 11/22/2013  ? Procedure: CARDIOVERSION;  Surgeon: Sanda Klein, MD;  Location: [facility];  Service: Cardiovascular;  Laterality: N/A;  ? dental implants    ? ELECTROPHYSIOLOGIC STUDY N/A 09/15/2015  ? Procedure: V Tach Ablation;  Surgeon: Evans Lance, MD;  Location: [place] [facility];  Service: Cardiovascular;  Laterality: N/A;  ? Evacution of epidural lumbar epidural abscess  1999  ? INSERT / REPLACE / REMOVE PACEMAKER  11/2008  ? MITRAL VALVE REPLACEMENT    ? w #[id number] [facility] [facility]  ? PERMANENT PACEMAKER INSERTION N/A 08/05/2011  ? Procedure: PERMANENT PACEMAKER INSERTION;  Surgeon: Evans Lance, MD;  Location: [facility] CATH LAB;  Service: Cardiovascular;  Laterality: N/A;  ? RIGHT HEART CATH N/A 02/26/2021  ? Procedure: RIGHT HEART CATH;  Surgeon: Larey Dresser, MD;  Location: [place] [facility];  Service: Cardiovascular;  Laterality: N/A;  ? THYROIDECTOMY    ? TONSILLECTOMY    ? VALVE REPLACEMENT  2000  ? ? ?Current Outpatient Medications  ?Medication Sig Dispense Refill  ? acetaminophen (TYLENOL) 500 MG tablet Take 1,000 mg by mouth every 6 (six) hours  as needed (pain).    ? amiodarone (PACERONE) 200 MG tablet Take 0.5 tablets (100 mg total) by mouth daily. 15 tablet 5  ? amoxicillin (AMOXIL) 500 MG capsule TAKE 4 CAPSULES BY MOUTH 30 MINUTES PRIOR TO DENTAL WORK 4 capsule 2  ? furosemide (LASIX) 20 MG tablet Take by mouth. 40 mg in the AM and 20 mg PM    ? levothyroxine (SYNTHROID) 50 MCG tablet Take 1.5 tablets (75 mcg total) by mouth daily before breakfast. 90 tablet 2  ? melatonin 5 MG TABS Take 5 mg by mouth at bedtime.    ? Multiple Vitamin  (MULTIVITAMIN WITH MINERALS) TABS tablet Take 1 tablet by mouth in the morning.    ? nitroGLYCERIN (NITROSTAT) 0.4 MG SL tablet Place 1 tablet (0.4 mg total) under the tongue every 5 (five) minutes x 3 doses as needed for chest pain. 25 tablet 2  ? spironolactone (ALDACTONE) 25 MG tablet Take 0.5 tablets (12.5 mg total) by mouth daily. 90 tablet 3  ? valsartan (DIOVAN) 40 MG tablet Take 0.5 tablets (20 mg total) by mouth 2 (two) times daily. 30 tablet 11  ? Vericiguat (VERQUVO) 5 MG TABS Take 1 tablet (5 mg total) by mouth daily. 30 tablet 6  ? warfarin (COUMADIN) 5 MG tablet TAKE AS DIRECTED BY THE COUMADIN CLINIC 80 tablet 1  ? metoprolol succinate (TOPROL-XL) 25 MG 24 hr tablet Take 0.5 tablets (12.5 mg total) by mouth at bedtime. Take with or immediately following a meal. 45 tablet 3  ? ?No current facility-administered medications for this visit.  ? ? ?Allergies:   Patient has no known allergies.  ? ?Social History: ?Social History  ? ?Socioeconomic History  ? Marital status: Married  ?  Spouse name: Not on file  ? Number of children: 1  ? Years of education: 68  ? Highest education level: Master's degree (e.g., MA, MS, MEng, MEd, MSW, MBA)  ?Occupational History  ? Occupation: retired from Engineer, manufacturing systems  ?Tobacco Use  ? Smoking status: Never  ? Smokeless tobacco: Never  ?Vaping Use  ? Vaping Use: Never used  ?Substance and Sexual Activity  ? Alcohol use: Yes  ?  Alcohol/week: 0.0 standard drinks  ?  Comment: occasionally  ? Drug use: No  ? Sexual activity: Not on file  ?Other Topics Concern  ? Not on file  ?Social History Narrative  ? Lives with wife in a one story home.  Has one daughter.  Retired.  Education: Masters.  ? ?Social Determinants of Health  ? ?Financial Resource Strain: Not on file  ?Food Insecurity: Not on file  ?Transportation Needs: Not on file  ?Physical Activity: Not on file  ?Stress: Not on file  ?Social Connections: Not on file  ?Intimate Partner Violence: Not on file  ? ? ?Family  History: ?Family History  ?Problem Relation Age of Onset  ? Heart disease Mother   ? Heart failure Mother   ? Heart disease Father   ? Heart failure Father   ? ? ?Review of Systems: ?All other systems reviewed and are otherwise negative except as noted above. ? ? ?Physical Exam: ?Vitals:  ? 11/18/21 1115  ?BP: 96/64  ?Pulse: 69  ?SpO2: 96%  ?Weight: 198 lb 9.6 oz (90.1 kg)  ?Height: '6\' 2"'$  (1.88 m)  ?  ? ?GEN- The patient is well appearing, alert and oriented x 3 today.   ?HEENT: normocephalic, atraumatic; sclera clear, conjunctiva pink; hearing intact; oropharynx clear; neck supple, no JVP ?Lymph-  no cervical lymphadenopathy ?Lungs- Clear to ausculation bilaterally, normal work of breathing.  No wheezes, rales, rhonchi ?Heart- Regular rate and rhythm, no murmurs, rubs or gallops, PMI not laterally displaced ?GI- soft, non-tender, non-distended, bowel sounds present, no hepatosplenomegaly ?Extremities- no clubbing or cyanosis. No edema; DP/PT/radial pulses 2+ bilaterally ?MS- no significant deformity or atrophy ?Skin- warm and dry, no rash or lesion; ICD pocket well healed ?Psych- euthymic mood, full affect ?Neuro- strength and sensation are intact ? ?ICD interrogation- reviewed in detail today,  See PACEART report ? ?EKG:  EKG is ordered today. ?Personal review of EKG ordered today shows V paced at 69 bpm ? ?Recent Labs: ?06/12/2021: Hemoglobin 13.5; Platelets 151 ?10/27/2021: ALT 24; B Natriuretic Peptide 545.5; TSH 13.373 ?11/17/2021: BUN 35; Creatinine, Ser 1.58; Magnesium 2.6; Potassium 4.4; Sodium 134  ? ?Wt Readings from Last 3 Encounters:  ?11/18/21 198 lb 9.6 oz (90.1 kg)  ?11/17/21 193 lb (87.5 kg)  ?11/12/21 198 lb (89.8 kg)  ?  ? ?Other studies Reviewed: ?Additional studies/ records that were reviewed today include: Previous EP office notes.  ? ?Assessment and Plan: ? ?1.  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 ?2. VT ?Paced out of VT and detections lowered last week.  ?Continue amiodarone 100 mg daily for now.  If recurs would re-load.  ?He had a second VT ablation 07/2021 by D

## 2021-11-19 ENCOUNTER — Ambulatory Visit (INDEPENDENT_AMBULATORY_CARE_PROVIDER_SITE_OTHER): Payer: Medicare Other

## 2021-11-19 DIAGNOSIS — I4819 Other persistent atrial fibrillation: Secondary | ICD-10-CM

## 2021-11-19 DIAGNOSIS — Z5181 Encounter for therapeutic drug level monitoring: Secondary | ICD-10-CM | POA: Diagnosis not present

## 2021-11-19 LAB — POCT INR: INR: 3.7 — AB (ref 2.0–3.0)

## 2021-11-19 NOTE — Progress Notes (Signed)
37

## 2021-11-19 NOTE — Patient Instructions (Signed)
Description   ?Spoke with pt and instructed to hold today's dose and then continue taking warfarin 1/2 tablet daily except for 1 tablet on Mondays, Wednesdays, and Fridays.  ?Recheck INR in 2 weeks (per request). [facility] [phone].  ? ?AMIO DOSE INCREASED TO '400MG'$  ON SAT & SUN ALL OTHER DAYS STAY AT '200MG'$ .   ?Pt's cell phone [phone] in case difficulty reaching pt on home phone. ?  ?   ?

## 2021-11-30 ENCOUNTER — Other Ambulatory Visit: Payer: Self-pay | Admitting: Physician Assistant

## 2021-11-30 DIAGNOSIS — I472 Ventricular tachycardia, unspecified: Secondary | ICD-10-CM

## 2021-11-30 DIAGNOSIS — I5022 Chronic systolic (congestive) heart failure: Secondary | ICD-10-CM

## 2021-12-03 ENCOUNTER — Ambulatory Visit (INDEPENDENT_AMBULATORY_CARE_PROVIDER_SITE_OTHER): Payer: Medicare Other

## 2021-12-03 DIAGNOSIS — I4819 Other persistent atrial fibrillation: Secondary | ICD-10-CM | POA: Diagnosis not present

## 2021-12-03 DIAGNOSIS — Z5181 Encounter for therapeutic drug level monitoring: Secondary | ICD-10-CM | POA: Diagnosis not present

## 2021-12-03 LAB — POCT INR: INR: 3 (ref 2.0–3.0)

## 2021-12-03 NOTE — Patient Instructions (Signed)
Description   Spoke with pt and instructed to take 1 tablet today and then continue taking warfarin 1/2 tablet daily except for 1 tablet on Mondays, Wednesdays, and Fridays. Recheck INR in 2 weeks. Coumadin Clinic [phone].  AMIO DOSE INCREASED TO 400MG ON SAT & SUN ALL OTHER DAYS STAY AT 200MG.   Pt's cell phone [phone] in case difficulty reaching pt on home phone.      

## 2021-12-04 ENCOUNTER — Other Ambulatory Visit: Payer: Self-pay | Admitting: Cardiology

## 2021-12-10 ENCOUNTER — Other Ambulatory Visit (HOSPITAL_COMMUNITY): Payer: Self-pay | Admitting: Cardiology

## 2021-12-14 ENCOUNTER — Telehealth: Payer: Self-pay

## 2021-12-14 ENCOUNTER — Ambulatory Visit (INDEPENDENT_AMBULATORY_CARE_PROVIDER_SITE_OTHER): Payer: Medicare Other

## 2021-12-14 DIAGNOSIS — I5022 Chronic systolic (congestive) heart failure: Secondary | ICD-10-CM

## 2021-12-14 DIAGNOSIS — Z95810 Presence of automatic (implantable) cardiac defibrillator: Secondary | ICD-10-CM | POA: Diagnosis not present

## 2021-12-14 NOTE — Progress Notes (Signed)
EPIC Encounter for ICM Monitoring  Patient Name: Austin [name] is a 81 y.o. male Date: 12/14/2021 Primary Care Physican: Lajean Manes, MD Primary Cardiologist: [facility] Electrophysiologist: Lovena Le 09/02/2021 Weight: 194.7 lbs   Since 18-Nov-2021 AT/AF 109 Time in AT/AF 23.6 hr/day (98.5%) Longest AT/AF 11 hours          Attempted call to patient and unable to reach.  Left message to return call. Transmission reviewed.    Optivol Thoracic impedance suggesting possible fluid accumulation since 5/13.   Prescribed:  Furosemide 20 mg Take 2 tablets (40 mg total) by mouth every morning AND 1 tablet (20 mg total) every evening.   Labs: 08/26/2021 Creatinine 1.54, BUN 24, Potassium 4.1, Sodium 137, GFR 46 07/31/2021 Creatinine 1.15, BUN 15, Potassium 4.3, Sodium 139, GFR 65 07/30/2021 Creatinine 1.28, BUN 21, Potassium 3.7, Sodium 141, GFR 57 A complete set of results can be found in Results Review.   Recommendations:  Unable to reach.   Recommendations will be given at OV with Dr Lovena Le if needed.    Follow-up plan: ICM clinic phone appointment on 12/23/2021 to recheck fluid levels.   91 day device clinic remote transmission 12/23/2021.      EP/Cardiology Office Visits:   12/15/2021 with Dr Lovena Le.  Recall 10/10/2022 with Dr Tamala Julian.   Copy of ICM check sent to Dr. Lovena Le.     3 month ICM trend: 12/14/2021.    12-14 Month ICM trend:     [name], RN 12/14/2021 12:12 PM

## 2021-12-14 NOTE — Progress Notes (Signed)
Spoke with patient and heart failure questions reviewed.  Pt reports increase in weight by 2-3 lbs and ankle swelling.  Advised to discuss fluid symptoms with Dr Lovena Le in the office tomorrow and he may increase diuretic for a couple of days.  Will check fluid levels next week.  Encouraged to fill out new DPR form and advised can update phone number to leave messages.

## 2021-12-14 NOTE — Telephone Encounter (Signed)
Remote ICM transmission received.  Attempted call to patient regarding ICM remote transmission and left message per DPR to return call.   

## 2021-12-15 ENCOUNTER — Ambulatory Visit: Payer: Medicare Other | Admitting: Internal Medicine

## 2021-12-15 ENCOUNTER — Encounter: Payer: Self-pay | Admitting: Internal Medicine

## 2021-12-15 VITALS — BP 100/62 | HR 80 | Ht 74.0 in | Wt 197.0 lb

## 2021-12-15 DIAGNOSIS — I472 Ventricular tachycardia, unspecified: Secondary | ICD-10-CM | POA: Diagnosis not present

## 2021-12-15 DIAGNOSIS — Z95810 Presence of automatic (implantable) cardiac defibrillator: Secondary | ICD-10-CM | POA: Diagnosis not present

## 2021-12-15 DIAGNOSIS — I5022 Chronic systolic (congestive) heart failure: Secondary | ICD-10-CM | POA: Diagnosis not present

## 2021-12-15 DIAGNOSIS — I4819 Other persistent atrial fibrillation: Secondary | ICD-10-CM

## 2021-12-15 NOTE — Patient Instructions (Addendum)
Medication Instructions:  Your physician recommends that you continue on your current medications as directed. Please refer to the Current Medication list given to you today.  Labwork: None ordered.  Testing/Procedures: None ordered.  Follow-Up: Your physician wants you to follow-up in: 6 months with [name], MD   Remote monitoring is used to monitor your ICD from home. This monitoring reduces the number of office visits required to check your device to one time per year. It allows [place] to keep an eye on the functioning of your device to ensure it is working properly. You are scheduled for a device check from home on 12/23/2021. You may send your transmission at any time that day. If you have a wireless device, the transmission will be sent automatically. After your physician reviews your transmission, you will receive a postcard with your next transmission date.  Any Other Special Instructions Will Be Listed Below (If Applicable).  If you need a refill on your cardiac medications before your next appointment, please call your pharmacy.   Important Information About Sugar

## 2021-12-15 NOTE — Progress Notes (Signed)
HPI [name] returns today for followup. 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 He has had ongoing VT. He has had 3 VT ablations, the first by me then the last 2 by Dr. Noralee Stain. The last ablation was done in hopes of trying to get him off of amiodarone. He had repeat VT after the last ablation one month ago and his amiodarone dose was restarted at 100 mg daily. Amiodarone keeps him out of VT but he feels terrible while taking this medication.   No Known Allergies   Current Outpatient Medications  Medication Sig Dispense Refill   acetaminophen (TYLENOL) 500 MG tablet Take 1,000 mg by mouth every 6 (six) hours as needed (pain).     amiodarone (PACERONE) 200 MG tablet Take 0.5 tablets (100 mg total) by mouth daily. 15 tablet 5   amoxicillin (AMOXIL) 500 MG capsule TAKE 4 CAPSULES BY MOUTH 30 MINUTES PRIOR TO DENTAL WORK 4 capsule 2   furosemide (LASIX) 20 MG tablet TAKE 2 TABLETS (40 MG TOTAL) BY MOUTH EVERY MORNING AND 1 TABLET (20 MG TOTAL) EVERY EVENING. 270 tablet 2   levothyroxine (SYNTHROID) 50 MCG tablet Take 1.5 tablets (75 mcg total) by mouth daily before breakfast. 90 tablet 2   melatonin 5 MG TABS Take 5 mg by mouth at bedtime.     metoprolol succinate (TOPROL-XL) 25 MG 24 hr tablet Take 0.5 tablets (12.5 mg total) by mouth at bedtime. Take with or immediately following a meal. 45 tablet 3   Multiple Vitamin (MULTIVITAMIN WITH MINERALS) TABS tablet Take 1 tablet by mouth in the morning.     nitroGLYCERIN (NITROSTAT) 0.4 MG SL tablet Place 1 tablet (0.4 mg total) under the tongue every 5 (five) minutes x 3 doses as needed for chest pain. 25 tablet 2   spironolactone (ALDACTONE) 25 MG tablet Take 0.5 tablets (12.5 mg total) by mouth daily. 90 tablet 3   valsartan (DIOVAN) 40 MG tablet Take 0.5 tablets (20 mg total) by mouth 2 (two) times daily. 30 tablet 11   Vericiguat (VERQUVO) 5 MG TABS Take 1 tablet (5 mg  total) by mouth daily. 30 tablet 6   warfarin (COUMADIN) 5 MG tablet TAKE AS DIRECTED BY THE COUMADIN CLINIC 80 tablet 1   No current facility-administered medications for this visit.     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    cx by infection, explantation11/12 & reimplant 1/13   Conductive hearing loss    Intraspinal abscess    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   Nonischemic cardiomyopathy ([place])    Psychosexual dysfunction with inhibited sexual excitement    S/P mitral valve replacement    Syncope and collapse    Unspecified sleep apnea    last sleep study 11/07   Ventricular tachycardia ([place])    VT (ventricular tachycardia) ([place])     ROS:   All systems reviewed and negative except as noted in the HPI.   Past Surgical History:  Procedure Laterality Date   BIV ICD GENERATOR CHANGEOUT N/A 05/04/2017   Procedure: BiV ICD Generator Changeout;  Surgeon: Evans Lance, MD;  Location: [place] [facility];  Service: Cardiovascular;  Laterality: N/A;   CARDIAC CATHETERIZATION  04/24/2002  CARDIAC VALVE REPLACEMENT     CARDIOVERSION  03/09/2012   Procedure: CARDIOVERSION;  Surgeon: Evans Lance, MD;  Location: [place];  Service: Cardiovascular;  Laterality: N/A;   CARDIOVERSION N/A 11/22/2013   Procedure: CARDIOVERSION;  Surgeon: Sanda Klein, MD;  Location: [place];  Service: Cardiovascular;  Laterality: N/A;   dental implants     ELECTROPHYSIOLOGIC STUDY N/A 09/15/2015   Procedure: Stephanie Coup Ablation;  Surgeon: Evans Lance, MD;  Location: [place] [facility];  Service: Cardiovascular;  Laterality: N/A;   Evacution of epidural lumbar epidural abscess  1999   INSERT / REPLACE / REMOVE PACEMAKER  11/2008   MITRAL VALVE REPLACEMENT     w #[id number] [facility]   PERMANENT PACEMAKER  INSERTION N/A 08/05/2011   Procedure: PERMANENT PACEMAKER INSERTION;  Surgeon: Evans Lance, MD;  Location: [facility] CATH LAB;  Service: Cardiovascular;  Laterality: N/A;   RIGHT HEART CATH N/A 02/26/2021   Procedure: RIGHT HEART CATH;  Surgeon: Larey Dresser, MD;  Location: [place] [facility];  Service: Cardiovascular;  Laterality: N/A;   THYROIDECTOMY     TONSILLECTOMY     VALVE REPLACEMENT  2000     Family History  Problem Relation Age of Onset   Heart disease Mother    Heart failure Mother    Heart disease Father    Heart failure Father      Social History   Socioeconomic History   Marital status: Married    Spouse name: Not on file   Number of children: 1   Years of education: 63   Highest education level: Master's degree (e.g., MA, MS, MEng, MEd, MSW, MBA)  Occupational History   Occupation: retired from [employer], manufacturing systems  Tobacco Use   Smoking status: Never   Smokeless tobacco: Never  Vaping Use   Vaping Use: Never used  Substance and Sexual Activity   Alcohol use: Yes    Alcohol/week: 0.0 standard drinks    Comment: occasionally   Drug use: No   Sexual activity: Not on file  Other Topics Concern   Not on file  Social History Narrative   Lives with wife in a one story home.  Has one daughter.  Retired.  Education: Masters.   Social Determinants of Health   Financial Resource Strain: Not on file  Food Insecurity: Not on file  Transportation Needs: Not on file  Physical Activity: Not on file  Stress: Not on file  Social Connections: Not on file  Intimate Partner Violence: Not on file     BP 100/62   Pulse 80   Ht '6\' 2"'$  (1.88 m)   Wt 197 lb (89.4 kg)   SpO2 95%   BMI 25.29 kg/m   Physical Exam:  Well appearing NAD HEENT: Unremarkable Neck:  No JVD, no thyromegally Lymphatics:  No adenopathy Back:  No CVA tenderness Lungs:  Clear HEART:  Regular rate rhythm, no murmurs, no rubs, no clicks, mechanical S1.  Abd:  soft, positive bowel sounds, no  organomegally, no rebound, no guarding Ext:  2 plus pulses, no edema, no cyanosis, no clubbing Skin:  No rashes no nodules Neuro:  CN II through XII intact, motor grossly intact  DEVICE  Normal device function.  See PaceArt for details.   Assess/Plan:  1. Atrial fib/flutter - his rates are well controlled. He has left atrial flutter and his rate is well controlled. 2. 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 I  do not appreciate MR or MS on exam.  3. 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  4. VT - he was back in VT in the office several weeks ago and was paced out of the VT. He has increased the amio to 200 mg daily and bid on sat/sun. He had a nice result from VT ablation years ago but he has had early return of VT since his last ablation several months ago. He will continue the amiodarone at 100 mg daily. If he has a lot more VT, we would consider adding Quinadine or uptitration of the amiodarone. 5. Chronic systolic heart failure -the results of his heart cath were reviewed. He has a low output state despite maximal medical therapy. I outlined the possibility of placing a left bundle area lead but also discussed the drawbacks including the risk of infection which could be catastrophic in that his left side is not able to be utilized, at least not the left subclavian vein. In light of the risks of another big surgery, likely requiring device extraction, we will undergo watchful waiting.    Carleene Overlie Alyvia Derk,MD

## 2021-12-17 ENCOUNTER — Ambulatory Visit (INDEPENDENT_AMBULATORY_CARE_PROVIDER_SITE_OTHER): Payer: Medicare Other | Admitting: *Deleted

## 2021-12-17 DIAGNOSIS — I4819 Other persistent atrial fibrillation: Secondary | ICD-10-CM | POA: Diagnosis not present

## 2021-12-17 DIAGNOSIS — Z5181 Encounter for therapeutic drug level monitoring: Secondary | ICD-10-CM | POA: Diagnosis not present

## 2021-12-17 LAB — POCT INR: INR: 3.4 — AB (ref 2.0–3.0)

## 2021-12-23 ENCOUNTER — Ambulatory Visit (INDEPENDENT_AMBULATORY_CARE_PROVIDER_SITE_OTHER): Payer: Medicare Other

## 2021-12-23 DIAGNOSIS — L821 Other seborrheic keratosis: Secondary | ICD-10-CM | POA: Diagnosis not present

## 2021-12-23 DIAGNOSIS — Z85828 Personal history of other malignant neoplasm of skin: Secondary | ICD-10-CM | POA: Diagnosis not present

## 2021-12-23 DIAGNOSIS — Z95810 Presence of automatic (implantable) cardiac defibrillator: Secondary | ICD-10-CM

## 2021-12-23 DIAGNOSIS — I5022 Chronic systolic (congestive) heart failure: Secondary | ICD-10-CM

## 2021-12-23 DIAGNOSIS — D692 Other nonthrombocytopenic purpura: Secondary | ICD-10-CM | POA: Diagnosis not present

## 2021-12-23 DIAGNOSIS — I442 Atrioventricular block, complete: Secondary | ICD-10-CM | POA: Diagnosis not present

## 2021-12-23 DIAGNOSIS — L853 Xerosis cutis: Secondary | ICD-10-CM | POA: Diagnosis not present

## 2021-12-23 DIAGNOSIS — L570 Actinic keratosis: Secondary | ICD-10-CM | POA: Diagnosis not present

## 2021-12-23 DIAGNOSIS — D485 Neoplasm of uncertain behavior of skin: Secondary | ICD-10-CM | POA: Diagnosis not present

## 2021-12-23 DIAGNOSIS — C44729 Squamous cell carcinoma of skin of left lower limb, including hip: Secondary | ICD-10-CM | POA: Diagnosis not present

## 2021-12-23 DIAGNOSIS — L812 Freckles: Secondary | ICD-10-CM | POA: Diagnosis not present

## 2021-12-23 NOTE — Progress Notes (Signed)
EPIC Encounter for ICM Monitoring  Patient Name: Austin [name] is a 81 y.o. male Date: 12/23/2021 Primary Care Physican: Lajean Manes, MD Primary Cardiologist: [facility] Electrophysiologist: Lovena Le 09/02/2021 Weight: 194.7 lbs 12/23/2021 Weight: 194.7 lbs   Since 15-Dec-2021 AT/AF 92 Time in AT/AF 22.9 hr/day (95.4%) Longest AT/AF 2 days          Spoke with patient and heart failure questions reviewed.  Pt weight returned close to baseline. He had foods high in salt over the memorial day weekend.     Optivol Thoracic impedance suggesting fluid levels trending close to baseline normal.    Prescribed:  Furosemide 20 mg Take 2 tablets (40 mg total) by mouth every morning AND 1 tablet (20 mg total) every evening.   He self adjusts Furosemide when needed.    Labs: 08/26/2021 Creatinine 1.54, BUN 24, Potassium 4.1, Sodium 137, GFR 46 07/31/2021 Creatinine 1.15, BUN 15, Potassium 4.3, Sodium 139, GFR 65 07/30/2021 Creatinine 1.28, BUN 21, Potassium 3.7, Sodium 141, GFR 57 A complete set of results can be found in Results Review.   Recommendations: No changes and encouraged to call if experiencing any fluid symptoms.    Follow-up plan: ICM clinic phone appointment on 01/18/2022.   91 day device clinic remote transmission 03/24/2022.      EP/Cardiology Office Visits:  12/29/2021 with HF clinic.   Recall 10/10/2022 with Dr Tamala Julian.   Copy of ICM check sent to Dr. Lovena Le.     3 month ICM trend: 12/23/2021.    12-14 Month ICM trend:     [name], RN 12/23/2021 12:00 PM

## 2021-12-24 LAB — CUP PACEART REMOTE DEVICE CHECK
Battery Remaining Longevity: 24 mo
Battery Remaining Longevity: 24 mo
Battery Voltage: 2.95 V
Battery Voltage: 2.94 V
Brady Statistic AP VP Percent: 2.83 %
Brady Statistic AP VP Percent: 8.33 %
Brady Statistic AP VS Percent: 0.13 %
Brady Statistic AP VS Percent: 0.31 %
Brady Statistic AS VP Percent: 86.86 %
Brady Statistic AS VP Percent: 81.8 %
Brady Statistic AS VS Percent: 10.18 %
Brady Statistic AS VS Percent: 9.56 %
Brady Statistic RA Percent Paced: 2.85 %
Brady Statistic RA Percent Paced: 8.19 %
Brady Statistic RV Percent Paced: 89.76 %
Brady Statistic RV Percent Paced: 90.15 %
Date Time Interrogation Session: 20230522022605
Date Time Interrogation Session: 20230531043825
HighPow Impedance: 71 Ohm
HighPow Impedance: 74 Ohm
Implantable Lead Implant Date: 20130110
Implantable Lead Implant Date: 20130110
Implantable Lead Implant Date: 20130110
Implantable Lead Implant Date: 20130110
Implantable Lead Location: 753859
Implantable Lead Location: 753860
Implantable Lead Location: 753859
Implantable Lead Location: 753860
Implantable Lead Model: 5076
Implantable Lead Model: 6935
Implantable Lead Model: 5076
Implantable Lead Model: 6935
Implantable Pulse Generator Implant Date: 20181010
Implantable Pulse Generator Implant Date: 20181010
Lead Channel Impedance Value: 304 Ohm
Lead Channel Impedance Value: 361 Ohm
Lead Channel Impedance Value: 418 Ohm
Lead Channel Impedance Value: 285 Ohm
Lead Channel Impedance Value: 342 Ohm
Lead Channel Impedance Value: 361 Ohm
Lead Channel Pacing Threshold Amplitude: 0.875 V
Lead Channel Pacing Threshold Amplitude: 1 V
Lead Channel Pacing Threshold Pulse Width: 0.4 ms
Lead Channel Pacing Threshold Pulse Width: 0.4 ms
Lead Channel Sensing Intrinsic Amplitude: 0.5 mV
Lead Channel Sensing Intrinsic Amplitude: 0.5 mV
Lead Channel Sensing Intrinsic Amplitude: 6.25 mV
Lead Channel Sensing Intrinsic Amplitude: 6.25 mV
Lead Channel Sensing Intrinsic Amplitude: 0.375 mV
Lead Channel Sensing Intrinsic Amplitude: 0.375 mV
Lead Channel Sensing Intrinsic Amplitude: 16.75 mV
Lead Channel Sensing Intrinsic Amplitude: 16.75 mV
Lead Channel Setting Pacing Amplitude: 2.5 V
Lead Channel Setting Pacing Amplitude: 3.5 V
Lead Channel Setting Pacing Amplitude: 2.5 V
Lead Channel Setting Pacing Amplitude: 3.5 V
Lead Channel Setting Pacing Pulse Width: 0.4 ms
Lead Channel Setting Pacing Pulse Width: 0.4 ms
Lead Channel Setting Sensing Sensitivity: 0.3 mV
Lead Channel Setting Sensing Sensitivity: 0.3 mV

## 2021-12-28 ENCOUNTER — Other Ambulatory Visit: Payer: Self-pay | Admitting: Internal Medicine

## 2021-12-28 NOTE — Progress Notes (Signed)
PCP: Lajean Manes, MD Cardiology: Dr. Tamala Julian EP: Dr. Lovena Le HF Cardiology: Dr. Delanna Ahmadi 81 y.o. with history of mechanical mitral valve s/p MV endocarditis, VT, nonischemic cardiomyopathy, complete heart block was referred by Dr. Tamala Julian for evaluation of CHF.  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 He subsequently developed a presumed nonischemic cardiomyopathy.  This was complicated by VT, he had a VT ablation in 2017 and had a Medtronic CRT-D device implanted. This had to be removed due to infection in 11/12 and was reimplanted in 1/13. Last echo in 5/22 showed EF 30% with normal RV, mechanical MV stable with mean gradient 3 mmHg.  He has been in atrial fibrillation persistently since late 2021.  He is on amiodarone and has been cardioverted in the past. His LV lead is not functional.  He has complete heart block and is persistently RV pacing.   Had [place] 02/26/21 RA 4, PA 32/12, PCWP 11, CO 4.6, CI 2.2. Normal filling pressures and slightly low cardiac output.   Digoxin was stopped due to elevated level.   He saw Dr. Lovena Le in 9/22 and was in slow VT in the office.  He was paced out of VT and amiodarone was increased to 200 mg daily.   He saw Dr. Lovena Le, and placement of a left bundle or LV lead was decided to be too risky a procedure.  He was referred to Dr. Oneida Alar with the hope that he could have a VT ablation and get off amiodarone, which is likely playing a role in his symptoms.     S/p VT ablation with Dr. Oneida Alar 1/23.  Echo 4/23 EF 20-25% with diffuse hypokinesis and septal-lateral dyssynchrony, mildly decreased RV systolic function, mechanical MV with normal function, dilated IVC.   Follow up 4/23, mildly volume overloaded, Lasix increased to 60/40 x 3 days then 40 bid, started spiro and decreased amio to 100 (felt this was making him feel bad/fatigued).   Seen 11/12/21 by Dr. Johney Frame, ECG showed VT, associated with  hypotension. Interrogation showed wide complex tachycardia 130's, AFL and VT. Dr. Caryl Comes able to ATP pace out of VT, remained in AFL. Arranged for EP follow up.  Today he returns for HF follow up. He has had 3 weeks of feeling well, but last 3 days have felt weak and more short of breath. Leg pain has improved on lower amiodarone dose. He walks outside around [facility] daily for exercise but gets short of breath if he pushes himself. Denies palpitations, CP, dizziness, edema, or PND/Orthopnea. Appetite ok. No fever or chills. Weight at home 193 pounds. Taking all medications. We discussed PT, he declined.  Medtronic device interrogation: remains in AFL, 1 hr/day activity.  ECG (personally reviewed): none ordered today.  Labs (7/22): K 4.6, creatinine 1.3 Labs (8/22): K 4.4, creatinine 1.23 Labs (10/22): K 3.9, creatinine 1.36 Labs (12/22): K 4.2, creatinine 1.39 Labs (1/23): K 4.3, creatinine 1.15 Labs (2/23): TSH 10, K 4.1, creatinine 1.54 Labs (4/23): K 4.2, creatinine 1.7, normal LFTs, TSH high 13  PMH: 1. 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 2. H/o VT: VT ablation in 2017.  - Redo VT ablation in 1/23 with Dr. Oneida Alar 3. Atrial fibrillation/atypical flutter: Persistent since 2021.  Failed amiodarone.  4. PAD: Tibial occlusive disease.  5. 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  6. Hypothyroidism.  7. Chronic systolic CHF: Presumed nonischemic cardiomyopathy.  Medtronic CRT-D device, LV lead is nonfunctional.  - Echo (2012):  EF 45% - Echo (1/17): EF 30-35% - Echo (6/21): EF 15-20% - Echo (11/21): EF 30-35%  - Echo (5/22): EF 30%, normal RV, mechanical mitral valve mean gradient 3 mmHg.  - RHC (8/22): RA mean 4, RV 33/4,PA 32/12 mean 21, PCWP mean 11, Oxygen saturations:PA 71% AO 98%, Cardiac Output (Fick) 4.66 Cardiac Index (Fick) 2.18 - Echo (4/23): EF 20-25% with diffuse hypokinesis and septal-lateral dyssynchrony, mildly decreased RV systolic  function, mechanical MV with normal function, dilated IVC.    Social History   Socioeconomic History   Marital status: Married    Spouse name: Not on file   Number of children: 1   Years of education: 50   Highest education level: Master's degree (e.g., MA, MS, MEng, MEd, MSW, MBA)  Occupational History   Occupation: retired from [employer], manufacturing systems  Tobacco Use   Smoking status: Never   Smokeless tobacco: Never  Vaping Use   Vaping Use: Never used  Substance and Sexual Activity   Alcohol use: Yes    Alcohol/week: 0.0 standard drinks    Comment: occasionally   Drug use: No   Sexual activity: Not on file  Other Topics Concern   Not on file  Social History Narrative   Lives with wife in a one story home.  Has one daughter.  Retired.  Education: Masters.   Social Determinants of Health   Financial Resource Strain: Not on file  Food Insecurity: Not on file  Transportation Needs: Not on file  Physical Activity: Not on file  Stress: Not on file  Social Connections: Not on file  Intimate Partner Violence: Not on file   Family History  Problem Relation Age of Onset   Heart disease Mother    Heart failure Mother    Heart disease Father    Heart failure Father    ROS: All systems reviewed and negative except as per HPI.   Current Outpatient Medications  Medication Sig Dispense Refill   acetaminophen (TYLENOL) 500 MG tablet Take 1,000 mg by mouth every 6 (six) hours as needed (pain).     amiodarone (PACERONE) 200 MG tablet Take 0.5 tablets (100 mg total) by mouth daily. 15 tablet 5   amoxicillin (AMOXIL) 500 MG capsule TAKE 4 CAPSULES BY MOUTH 30 MINUTES PRIOR TO DENTAL WORK 4 capsule 2   furosemide (LASIX) 20 MG tablet TAKE 2 TABLETS (40 MG TOTAL) BY MOUTH EVERY MORNING AND 1 TABLET (20 MG TOTAL) EVERY EVENING. 270 tablet 2   levothyroxine (SYNTHROID) 50 MCG tablet Take 1.5 tablets (75 mcg total) by mouth daily before breakfast. 90 tablet 2   metoprolol succinate  (TOPROL-XL) 25 MG 24 hr tablet Take 0.5 tablets (12.5 mg total) by mouth at bedtime. Take with or immediately following a meal. 45 tablet 3   Multiple Vitamin (MULTIVITAMIN WITH MINERALS) TABS tablet Take 1 tablet by mouth in the morning.     nitroGLYCERIN (NITROSTAT) 0.4 MG SL tablet Place 1 tablet (0.4 mg total) under the tongue every 5 (five) minutes x 3 doses as needed for chest pain. 25 tablet 2   spironolactone (ALDACTONE) 25 MG tablet Take 0.5 tablets (12.5 mg total) by mouth daily. 90 tablet 3   valsartan (DIOVAN) 40 MG tablet Take 0.5 tablets (20 mg total) by mouth 2 (two) times daily. 30 tablet 11   Vericiguat (VERQUVO) 5 MG TABS Take 1 tablet (5 mg total) by mouth daily. 30 tablet 6   warfarin (COUMADIN) 5 MG tablet TAKE AS DIRECTED BY THE  COUMADIN CLINIC 80 tablet 1   melatonin 5 MG TABS Take 5 mg by mouth at bedtime. (Patient not taking: Reported on 12/29/2021)     No current facility-administered medications for this encounter.   BP 110/74   Pulse 81   Wt 90.6 kg (199 lb 12.8 oz)   SpO2 92%   BMI 25.65 kg/m   Wt Readings from Last 3 Encounters:  12/29/21 90.6 kg (199 lb 12.8 oz)  12/15/21 89.4 kg (197 lb)  11/18/21 90.1 kg (198 lb 9.6 oz)   Physical Exam: General:  NAD. No resp difficulty HEENT: Normal Neck: Supple. No JVD. Carotids 2+ bilat; no bruits. No lymphadenopathy or thryomegaly appreciated. Cor: PMI nondisplaced. Irregular rate & rhythm. No rubs, gallops or murmurs, mechanical S1 Lungs: Clear Abdomen: Soft, nontender, nondistended. No hepatosplenomegaly. No bruits or masses. Good bowel sounds. Extremities: No cyanosis, clubbing, rash, trace pedal edema Neuro: Alert & oriented x 3, cranial nerves grossly intact. Moves all 4 extremities w/o difficulty. Affect pleasant.  Assessment/Plan: 1. Chronic systolic CHF: Long-standing, suspected nonischemic cardiomyopathy.  Echo today showed  EF 20-25% with diffuse hypokinesis and septal-lateral dyssynchrony, mildly  decreased RV systolic function, mechanical MV with normal function, dilated IVC.  He has a Medtronic CRT-D device present, but the LV lead is not functional.  He has complete heart block, so is RV paced chronically. It looks like he is predominantly in atrial fibrillation but does have short periods of NSR.  Had [place] on 02/26/21 with normal filling pressures and mildly low cardiac output.  NYHA class III symptoms, he is not volume overloaded on exam or by device. He has had difficultly tolerating GDMT.  - Continue Lasix 40 mg am/20 mg pm.  BMET today. - Continue spironolactone 12.5 mg daily.   - With orthostatic symptoms, he is now off Entresto and on valsartan 20 mg bid.   - Continue Toprol XL 12.5 mg daily at bedtime.  - Continue Verquvo 5 mg daily.   - Off digoxin due to persistently elevated level.  - The patient has CHB and is chronically RV pacing as his LV lead is nonfunctional.  This is not ideal and creates dyssynchrony.  Remains symptomatic with limited ability to titrate meds. He has seen Dr. Lovena Le, it would be possible to place left bundle lead but procedure would be complicated.  After further discussion with Dr. Lovena Le, it was decided that risk would be too high and LB lead will not be attempted.   - ? Bartostim candidacy. Will discuss with Research nurse. 2. Atrial fibrillation: Persistent since 2021.  Mainly in AF/AFL, but occasionally in NSR. Rate controlled. - Previously recommended against repeat DCCV as unlikely to hold NSR. - He is on amiodarone.  - Continue Warfarin.  3. Mechanical mitral valve: Valve OK on most recent echo.  - Continue warfarin, INR goal 3-3.5.  - He has not been on ASA.  4. VT: History of VT and VT ablation in 2017.  MDT ICD.  Redo VT ablation in 1/23 (Dr. Oneida Alar).  He was on amiodarone 200 mg daily with recurrent VT. Recent AFL w/ RVR and VT, able to be paced out of VT.  - Continue on amiodarone 100 mg daily (recently reduced as he feels it makes him  fatigued and feel "bad." ) Recent LFTs ok. - He will need a regular eye exam.  5. Complete heart block: Per notes, patient is pacer dependent due to CHB.  Currently RV pacing chronically.  - See above, upgrade  to CRT thought to be too risky.   6. Hypothyroidism: He is on Levoxyl 75 mg, recently increased due to elevated TSH. - Repeat TSH.  Follow up in 6 weeks with Dr. Aundra Dubin as scheduled.  Nice, FNP-BC 12/29/2021

## 2021-12-29 ENCOUNTER — Ambulatory Visit (HOSPITAL_COMMUNITY)
Admission: RE | Admit: 2021-12-29 | Discharge: 2021-12-29 | Disposition: A | Discharge status: Home / Self Care | Payer: Medicare Other | Source: Ambulatory Visit | Attending: Family Medicine | Admitting: Family Medicine

## 2021-12-29 ENCOUNTER — Encounter (HOSPITAL_COMMUNITY): Payer: Self-pay

## 2021-12-29 VITALS — BP 110/74 | HR 81 | Wt 199.8 lb

## 2021-12-29 DIAGNOSIS — Z950 Presence of cardiac pacemaker: Secondary | ICD-10-CM | POA: Diagnosis not present

## 2021-12-29 DIAGNOSIS — Z79899 Other long term (current) drug therapy: Secondary | ICD-10-CM | POA: Insufficient documentation

## 2021-12-29 DIAGNOSIS — I4891 Unspecified atrial fibrillation: Secondary | ICD-10-CM | POA: Insufficient documentation

## 2021-12-29 DIAGNOSIS — I442 Atrioventricular block, complete: Secondary | ICD-10-CM | POA: Diagnosis not present

## 2021-12-29 DIAGNOSIS — Z952 Presence of prosthetic heart valve: Secondary | ICD-10-CM | POA: Diagnosis not present

## 2021-12-29 DIAGNOSIS — I429 Cardiomyopathy, unspecified: Secondary | ICD-10-CM | POA: Diagnosis not present

## 2021-12-29 DIAGNOSIS — Z7901 Long term (current) use of anticoagulants: Secondary | ICD-10-CM | POA: Insufficient documentation

## 2021-12-29 DIAGNOSIS — Z79890 Hormone replacement therapy (postmenopausal): Secondary | ICD-10-CM | POA: Insufficient documentation

## 2021-12-29 DIAGNOSIS — I5022 Chronic systolic (congestive) heart failure: Secondary | ICD-10-CM | POA: Diagnosis not present

## 2021-12-29 DIAGNOSIS — I472 Ventricular tachycardia, unspecified: Secondary | ICD-10-CM

## 2021-12-29 DIAGNOSIS — I4819 Other persistent atrial fibrillation: Secondary | ICD-10-CM

## 2021-12-29 DIAGNOSIS — E039 Hypothyroidism, unspecified: Secondary | ICD-10-CM

## 2021-12-29 LAB — BASIC METABOLIC PANEL
Anion gap: 8 (ref 5–15)
BUN: 30 mg/dL — ABNORMAL HIGH (ref 8–23)
CO2: 24 mmol/L (ref 22–32)
Calcium: 8.9 mg/dL (ref 8.9–10.3)
Chloride: 107 mmol/L (ref 98–111)
Creatinine, Ser: 1.3 mg/dL — ABNORMAL HIGH (ref 0.61–1.24)
GFR, Estimated: 56 mL/min — ABNORMAL LOW (ref >60)
Glucose, Bld: 88 mg/dL (ref 70–99)
Potassium: 4.1 mmol/L (ref 3.5–5.1)
Sodium: 139 mmol/L (ref 135–145)

## 2021-12-29 LAB — BRAIN NATRIURETIC PEPTIDE: B Natriuretic Peptide: 798 pg/mL — ABNORMAL HIGH (ref 0.0–100.0)

## 2021-12-29 LAB — TSH: TSH: 6.656 u[IU]/mL — ABNORMAL HIGH (ref 0.350–4.500)

## 2021-12-29 NOTE — Patient Instructions (Signed)
Thank you for coming in today  Labs were done today, if any labs are abnormal the clinic will call you No news is good news  Your physician recommends that you schedule a follow-up appointment in:  Keep follow up appointment  At the [facility], you and your health needs are our priority. As part of our continuing mission to provide you with exceptional heart care, we have created designated Provider Care Teams. These Care Teams include your primary Cardiologist (physician) and Advanced Practice Providers (APPs- Physician Assistants and Nurse Practitioners) who all work together to provide you with the care you need, when you need it.   You may see any of the following providers on your designated Care Team at your next follow up: Dr Glori Bickers Dr Haynes Kerns, NP Lyda Jester, [place] [facility] [place], [place] Audry Riles, PharmD   Please be sure to bring in all your medications bottles to every appointment.   If you have any questions or concerns before your next appointment please send [place] a message through [place] or call our office at [phone].    TO LEAVE A MESSAGE FOR THE NURSE SELECT OPTION 2, PLEASE LEAVE A MESSAGE INCLUDING: YOUR NAME DATE OF BIRTH CALL BACK NUMBER REASON FOR CALL**this is important as we prioritize the call backs  YOU WILL RECEIVE A CALL BACK THE SAME DAY AS LONG AS YOU CALL BEFORE 4:00 PM

## 2021-12-31 ENCOUNTER — Encounter (HOSPITAL_COMMUNITY): Payer: Self-pay | Admitting: Cardiology

## 2021-12-31 ENCOUNTER — Other Ambulatory Visit: Payer: Self-pay | Admitting: Physician Assistant

## 2021-12-31 ENCOUNTER — Ambulatory Visit (INDEPENDENT_AMBULATORY_CARE_PROVIDER_SITE_OTHER): Payer: Medicare Other | Admitting: *Deleted

## 2021-12-31 DIAGNOSIS — I4891 Unspecified atrial fibrillation: Secondary | ICD-10-CM | POA: Diagnosis not present

## 2021-12-31 DIAGNOSIS — Z5181 Encounter for therapeutic drug level monitoring: Secondary | ICD-10-CM

## 2021-12-31 DIAGNOSIS — I472 Ventricular tachycardia, unspecified: Secondary | ICD-10-CM

## 2021-12-31 DIAGNOSIS — I5022 Chronic systolic (congestive) heart failure: Secondary | ICD-10-CM

## 2021-12-31 DIAGNOSIS — Z952 Presence of prosthetic heart valve: Secondary | ICD-10-CM | POA: Diagnosis not present

## 2021-12-31 LAB — POCT INR: INR: 3.4 — AB (ref 2.0–3.0)

## 2021-12-31 NOTE — Patient Instructions (Signed)
Description   Spoke with pt and instructed to continue taking warfarin 1/2 tablet daily except for 1 tablet on Mondays, Wednesdays, and Fridays. Recheck INR in 2 weeks. Coumadin Clinic [phone].  AMIO DOSE INCREASED TO 400MG ON SAT & SUN ALL OTHER DAYS STAY AT 200MG.   Pt's cell phone [phone] in case difficulty reaching pt on home phone.      

## 2022-01-01 ENCOUNTER — Telehealth: Payer: Self-pay | Admitting: Interventional Cardiology

## 2022-01-01 NOTE — Telephone Encounter (Signed)
I advised the pt that I spoke with [facility] and they note that his insurance company did "kick it back:" and say that he would have to call them to see why his plan is not covering it... otherwise he does have a prescription ready to pick up for 90 days on a discount card for 23.00.   He says he will call today and he says the 23.00 is reasonable for him but I suggested he contact the Insurance Co. for his future fills and let us know if he has any problems.

## 2022-01-01 NOTE — Telephone Encounter (Signed)
Pt c/o medication issue:  1. Name of Medication:  levothyroxine (SYNTHROID) 50 MCG tablet  2. How are you currently taking this medication (dosage and times per day)?   3. Are you having a reaction (difficulty breathing--STAT)?   4. What is your medication issue?   Patient states [facility] informed him that the medication is not covered by his insurance. He would like to know how to proceed. Is there an alternative the patient may be eligible for? Please advise.

## 2022-01-06 NOTE — Progress Notes (Signed)
Remote ICD transmission.   

## 2022-01-14 ENCOUNTER — Ambulatory Visit (INDEPENDENT_AMBULATORY_CARE_PROVIDER_SITE_OTHER): Payer: Medicare Other | Admitting: *Deleted

## 2022-01-14 DIAGNOSIS — Z5181 Encounter for therapeutic drug level monitoring: Secondary | ICD-10-CM | POA: Diagnosis not present

## 2022-01-14 DIAGNOSIS — I4891 Unspecified atrial fibrillation: Secondary | ICD-10-CM | POA: Diagnosis not present

## 2022-01-14 LAB — POCT INR: INR: 3.4 — AB (ref 2.0–3.0)

## 2022-01-18 ENCOUNTER — Ambulatory Visit (INDEPENDENT_AMBULATORY_CARE_PROVIDER_SITE_OTHER): Payer: Medicare Other

## 2022-01-18 DIAGNOSIS — Z95810 Presence of automatic (implantable) cardiac defibrillator: Secondary | ICD-10-CM | POA: Diagnosis not present

## 2022-01-18 DIAGNOSIS — I5022 Chronic systolic (congestive) heart failure: Secondary | ICD-10-CM

## 2022-01-19 DIAGNOSIS — E785 Hyperlipidemia, unspecified: Secondary | ICD-10-CM | POA: Diagnosis not present

## 2022-01-19 DIAGNOSIS — G629 Polyneuropathy, unspecified: Secondary | ICD-10-CM | POA: Diagnosis not present

## 2022-01-19 DIAGNOSIS — Z950 Presence of cardiac pacemaker: Secondary | ICD-10-CM | POA: Diagnosis not present

## 2022-01-19 DIAGNOSIS — N1831 Chronic kidney disease, stage 3a: Secondary | ICD-10-CM | POA: Diagnosis not present

## 2022-01-19 DIAGNOSIS — D6869 Other thrombophilia: Secondary | ICD-10-CM | POA: Diagnosis not present

## 2022-01-19 DIAGNOSIS — Z0000 Encounter for general adult medical examination without abnormal findings: Secondary | ICD-10-CM | POA: Diagnosis not present

## 2022-01-19 DIAGNOSIS — E039 Hypothyroidism, unspecified: Secondary | ICD-10-CM | POA: Diagnosis not present

## 2022-01-19 DIAGNOSIS — I129 Hypertensive chronic kidney disease with stage 1 through stage 4 chronic kidney disease, or unspecified chronic kidney disease: Secondary | ICD-10-CM | POA: Diagnosis not present

## 2022-01-19 DIAGNOSIS — I5022 Chronic systolic (congestive) heart failure: Secondary | ICD-10-CM | POA: Diagnosis not present

## 2022-01-19 DIAGNOSIS — Z79899 Other long term (current) drug therapy: Secondary | ICD-10-CM | POA: Diagnosis not present

## 2022-01-19 DIAGNOSIS — G4733 Obstructive sleep apnea (adult) (pediatric): Secondary | ICD-10-CM | POA: Diagnosis not present

## 2022-01-20 ENCOUNTER — Telehealth: Payer: Self-pay | Admitting: Interventional Cardiology

## 2022-01-20 NOTE — Telephone Encounter (Signed)
Patient states he is returning a call from today. 

## 2022-01-20 NOTE — Progress Notes (Signed)
EPIC Encounter for ICM Monitoring  Patient Name: Austin [name] is a 81 y.o. male Date: 01/20/2022 Primary Care Physican: Lajean Manes, MD Primary Cardiologist: [place] Electrophysiologist: Lovena Le 09/02/2021 Weight: 194.7 lbs 12/23/2021 Weight: 194.7 lbs   Treated Since 29-Dec-2021 FVT 1 VT 6 Monitored since Since 29-Dec-2021 SVT: VT/VF Rx Withheld 1 AT/AF 64 Time in AT/AF 17.9 hr/day (74.8%) Longest AT/AF 4 days          Spoke with patient and heart failure questions reviewed.  Pt had been feeling well for about the last 2 weeks but prior to that around 6/8 he was not feeling well.  He sent mychart message on 6/8 to Dr Claris Gladden office reporting weakness, SOB, lightheadedness.   Today he said his pulse rate went to 130 and not feeling as well today.     Optivol Thoracic impedance suggesting normal fluid levels but report showing 7 pace terminated episodes between 6/8-6/11.  Message sent to device clinic triage informing of terminated episodes.  Advised device clinic triage nurse will contact him for more information regarding episodes.     Prescribed:  Furosemide 20 mg Take 2 tablets (40 mg total) by mouth every morning AND 1 tablet (20 mg total) every evening.   He self adjusts Furosemide when needed.    Labs: 12/29/2021 Creatinine 1.30, BUN 30, Potassium 4.1, Sodium 139, GFR 56 11/17/2021 Creatinine 1.58, BUN 35, Potassium 4.4, Sodium 134, GFR 44  11/06/2021 Creatinine 1.70, BUN 35, Potassium 4.2, Sodium 135, GFR 41  10/27/2021 Creatinine 1.54, BUN 30, Potassium 4.7, Sodium 141, GFR 46  08/26/2021 Creatinine 1.54, BUN 24, Potassium 4.1, Sodium 137, GFR 46 07/31/2021 Creatinine 1.15, BUN 15, Potassium 4.3, Sodium 139, GFR 65 07/30/2021 Creatinine 1.28, BUN 21, Potassium 3.7, Sodium 141, GFR 57 A complete set of results can be found in Results Review.   Recommendations: No changes and encouraged to call if experiencing any fluid symptoms.    Follow-up plan: ICM clinic phone  appointment on 02/22/2022.   91 day device clinic remote transmission 03/24/2022.      EP/Cardiology Office Visits:  02/19/2022 with Dr Aundra Dubin.   06/10/2022 with Dr Lovena Le.  Recall 10/10/2022 with Dr Tamala Julian.     Copy of ICM check sent to Dr. Lovena Le.      3 month ICM trend: 01/18/2022.    12-14 Month ICM trend:     [name], RN 01/20/2022 3:29 PM

## 2022-01-22 ENCOUNTER — Telehealth: Payer: Self-pay

## 2022-01-22 MED ORDER — AMIODARONE HCL 200 MG PO TABS: ORAL_TABLET | ORAL | 5 refills | Status: DC

## 2022-01-22 NOTE — Telephone Encounter (Signed)
Spoke with patient regarding VT episodes on 01/03/22, 01/02/22,01/01/22,12/31/21 each terminated with ATP x 1.  Informed patient that I had reviewed episodes with Dr. Lovena Le and Dr. Lovena Le recommended patient increase Amiodarone to '200mg'$  on Saturday and Sunday and  '100mg'$  M-F daily patient voiced understanding of  instructions.

## 2022-01-24 ENCOUNTER — Other Ambulatory Visit (HOSPITAL_COMMUNITY): Payer: Self-pay | Admitting: Cardiology

## 2022-01-28 ENCOUNTER — Telehealth: Payer: Self-pay

## 2022-01-28 ENCOUNTER — Ambulatory Visit (INDEPENDENT_AMBULATORY_CARE_PROVIDER_SITE_OTHER): Payer: Medicare Other | Admitting: *Deleted

## 2022-01-28 DIAGNOSIS — Z5181 Encounter for therapeutic drug level monitoring: Secondary | ICD-10-CM

## 2022-01-28 DIAGNOSIS — I4891 Unspecified atrial fibrillation: Secondary | ICD-10-CM

## 2022-01-28 LAB — POCT INR: INR: 2.9 (ref 2.0–3.0)

## 2022-01-28 NOTE — Telephone Encounter (Signed)
Lpm to check INR 

## 2022-01-28 NOTE — Patient Instructions (Addendum)
Description   Spoke with pt and instructed to take 1 tablet today then continue taking warfarin 1/2 tablet daily except for 1 tablet on Mondays, Wednesdays, and Fridays. Recheck INR in 2 weeks. Coumadin Clinic [phone].   AMIO DOSE INCREASED TO '400MG'$  ON SAT & SUN ALL OTHER DAYS STAY AT '200MG'$ .   Pt's cell phone [phone] in case difficulty reaching pt on home phone.

## 2022-02-03 ENCOUNTER — Encounter: Payer: Self-pay | Admitting: Internal Medicine

## 2022-02-03 NOTE — Telephone Encounter (Signed)
Unsuccessful telephone encounter to patient to request manual transmission. Left message on both given numbers. MyChart message sent.

## 2022-02-04 NOTE — Telephone Encounter (Signed)
Transmission received and reviewed with Dr. Lovena Le. AT/AF episodes are "double tachycardia" per Dr. Lovena Le (AT and VT). VT ATP slows V rate however AT continues during episodes. No changes to treatment plan at this time. Patient is advised of ED precautions. Austin [name] states HR this morning was 113. "Feels pretty good". He is advised of [place] driving restrictions and shock plan. Continue to monitor.

## 2022-02-08 ENCOUNTER — Other Ambulatory Visit (HOSPITAL_COMMUNITY): Payer: Self-pay | Admitting: Cardiology

## 2022-02-11 ENCOUNTER — Ambulatory Visit (INDEPENDENT_AMBULATORY_CARE_PROVIDER_SITE_OTHER): Payer: Medicare Other

## 2022-02-11 DIAGNOSIS — I4891 Unspecified atrial fibrillation: Secondary | ICD-10-CM

## 2022-02-11 DIAGNOSIS — Z5181 Encounter for therapeutic drug level monitoring: Secondary | ICD-10-CM | POA: Diagnosis not present

## 2022-02-11 LAB — POCT INR: INR: 3.5 — AB (ref 2.0–3.0)

## 2022-02-11 NOTE — Patient Instructions (Signed)
Description   Spoke with pt and instructed to continue taking warfarin 1/2 tablet daily except for 1 tablet on Mondays, Wednesdays, and Fridays. Recheck INR in 2 weeks. Coumadin Clinic [phone].  AMIO DOSE INCREASED TO '400MG'$  ON SAT & SUN ALL OTHER DAYS STAY AT '200MG'$ .   Pt's cell phone [phone] in case difficulty reaching pt on home phone.

## 2022-02-18 ENCOUNTER — Encounter: Payer: Self-pay | Admitting: Internal Medicine

## 2022-02-19 ENCOUNTER — Telehealth (HOSPITAL_COMMUNITY): Payer: Self-pay | Admitting: Pharmacy Technician

## 2022-02-19 ENCOUNTER — Encounter: Payer: Self-pay | Admitting: Internal Medicine

## 2022-02-19 ENCOUNTER — Other Ambulatory Visit (HOSPITAL_COMMUNITY): Payer: Self-pay

## 2022-02-19 ENCOUNTER — Ambulatory Visit (HOSPITAL_COMMUNITY)
Admission: RE | Admit: 2022-02-19 | Discharge: 2022-02-19 | Disposition: A | Discharge status: Home / Self Care | Payer: Medicare Other | Source: Ambulatory Visit | Attending: Cardiology | Admitting: Cardiology

## 2022-02-19 ENCOUNTER — Encounter (HOSPITAL_COMMUNITY): Payer: Self-pay | Admitting: Cardiology

## 2022-02-19 VITALS — BP 110/70 | HR 118 | Wt 200.6 lb

## 2022-02-19 DIAGNOSIS — I4819 Other persistent atrial fibrillation: Secondary | ICD-10-CM

## 2022-02-19 DIAGNOSIS — I5022 Chronic systolic (congestive) heart failure: Secondary | ICD-10-CM | POA: Insufficient documentation

## 2022-02-19 DIAGNOSIS — I472 Ventricular tachycardia, unspecified: Secondary | ICD-10-CM | POA: Insufficient documentation

## 2022-02-19 DIAGNOSIS — I428 Other cardiomyopathies: Secondary | ICD-10-CM | POA: Diagnosis not present

## 2022-02-19 DIAGNOSIS — I4892 Unspecified atrial flutter: Secondary | ICD-10-CM | POA: Diagnosis not present

## 2022-02-19 LAB — COMPREHENSIVE METABOLIC PANEL
ALT: 32 U/L (ref 0–44)
AST: 38 U/L (ref 15–41)
Albumin: 4 g/dL (ref 3.5–5.0)
Alkaline Phosphatase: 69 U/L (ref 38–126)
Anion gap: 8 (ref 5–15)
BUN: 35 mg/dL — ABNORMAL HIGH (ref 8–23)
CO2: 26 mmol/L (ref 22–32)
Calcium: 9.5 mg/dL (ref 8.9–10.3)
Chloride: 105 mmol/L (ref 98–111)
Creatinine, Ser: 1.49 mg/dL — ABNORMAL HIGH (ref 0.61–1.24)
GFR, Estimated: 47 mL/min — ABNORMAL LOW (ref >60)
Glucose, Bld: 112 mg/dL — ABNORMAL HIGH (ref 70–99)
Potassium: 4.2 mmol/L (ref 3.5–5.1)
Sodium: 139 mmol/L (ref 135–145)
Total Bilirubin: 1 mg/dL (ref 0.3–1.2)
Total Protein: 7.4 g/dL (ref 6.5–8.1)

## 2022-02-19 LAB — CBC
HCT: 43.5 % (ref 39.0–52.0)
Hemoglobin: 14.1 g/dL (ref 13.0–17.0)
MCH: 30.8 pg (ref 26.0–34.0)
MCHC: 32.4 g/dL (ref 30.0–36.0)
MCV: 95 fL (ref 80.0–100.0)
Platelets: 152 10*3/uL (ref 150–400)
RBC: 4.58 MIL/uL (ref 4.22–5.81)
RDW: 15.4 % (ref 11.5–15.5)
WBC: 8.2 10*3/uL (ref 4.0–10.5)
nRBC: 0 % (ref 0.0–0.2)

## 2022-02-19 LAB — TSH: TSH: 10.88 u[IU]/mL — ABNORMAL HIGH (ref 0.350–4.500)

## 2022-02-19 LAB — T4, FREE: Free T4: 1.41 ng/dL — ABNORMAL HIGH (ref 0.61–1.12)

## 2022-02-19 LAB — MAGNESIUM: Magnesium: 2.6 mg/dL — ABNORMAL HIGH (ref 1.7–2.4)

## 2022-02-19 LAB — PROTIME-INR
INR: 2.3 — ABNORMAL HIGH (ref 0.8–1.2)
Prothrombin Time: 24.9 seconds — ABNORMAL HIGH (ref 11.4–15.2)

## 2022-02-19 LAB — BRAIN NATRIURETIC PEPTIDE: B Natriuretic Peptide: 1244.2 pg/mL — ABNORMAL HIGH (ref 0.0–100.0)

## 2022-02-19 NOTE — TOC Benefit Eligibility Note (Signed)
Patient Teacher, English as a foreign language completed.    The patient is currently admitted and upon discharge could be taking quinidine sulfate 300 mg tablets.  The current 30 day co-pay is $13.27.   The patient is currently admitted and upon discharge could be taking quinidine sulfate 200 mg tablets.  The current 30 day co-pay is $237.88.   The patient is insured through [place], [place] Patient Advocate Specialist [place] Patient Advocate Team Direct Number: [phone]  Fax: [phone]

## 2022-02-19 NOTE — Patient Instructions (Addendum)
Please take Spironolactone 12.5 mg daily.  Labs done today, your results will be available in MyChart, we will contact you for abnormal readings.   Your physician recommends that you schedule a follow-up appointment in: 10 days   If you have any questions or concerns before your next appointment please send [place] a message through [place] or call our office at [phone].    TO LEAVE A MESSAGE FOR THE NURSE SELECT OPTION 2, PLEASE LEAVE A MESSAGE INCLUDING: YOUR NAME DATE OF BIRTH CALL BACK NUMBER REASON FOR CALL**this is important as we prioritize the call backs  YOU WILL RECEIVE A CALL BACK THE SAME DAY AS LONG AS YOU CALL BEFORE 4:00 PM  At the [facility], you and your health needs are our priority. As part of our continuing mission to provide you with exceptional heart care, we have created designated Provider Care Teams. These Care Teams include your primary Cardiologist (physician) and Advanced Practice Providers (APPs- Physician Assistants and Nurse Practitioners) who all work together to provide you with the care you need, when you need it.   You may see any of the following providers on your designated Care Team at your next follow up: Dr Glori Bickers Dr Haynes Kerns, NP Lyda Jester, [place] [facility] [place], [place] Audry Riles, PharmD   Please be sure to bring in all your medications bottles to every appointment.

## 2022-02-19 NOTE — Progress Notes (Deleted)
Asked by Dr. Aundra Dubin to evaluate tachycardia.  Austin [name] has a history of recurrent ventricular tachycardia with ablation x3; I met him in the office 4/23 when he presented with ventricular tachycardia and we were able to successfully pace terminate his ventricular tachycardia.  It was below his detection rate; his VT detection rate was reprogrammed from 150--130.  He presents today with a 2-day history of tachycardia accompanied by lightheadedness fatigue and shortness of breath.  Device interrogation demonstrates that his ventricular tachycardia at 118.  Moreover, and demonstrated about 15 episodes of ventricular tachycardia since reprogramming 4/23 that had been terminated with antitachycardia pacing; the data available since early June 2023 demonstrated that all but 1 VT's were terminated with ATP x1; the other 1 required ATP x2.  With this, we paced terminated his ventricular tachycardia today.  It was at 68% of the RR interval.  I have reprogrammed his device to detect VT faster than 109.  I have created a burst algorithm x4 in therapy 1 and therapy 2, the idea being to prevent excessively rapid pacing efforts, the consequences of ever shortening cycle length.  With this in mind, have discussed with Dr. DM and we will hold off on quinidine at this point as successful ATP albeit relatively frequently i.e. 15 x 90 days, is likely associated with fewer untoward consequences than quinidine.  For now, we will continue him on amiodarone at 200 mg a day.

## 2022-02-19 NOTE — Progress Notes (Signed)
PCP: Lajean Manes, MD Cardiology: Dr. Tamala Julian HF Cardiology: Dr. Aundra Dubin EP: Dr. Lovena Le  81 y.o. with history of mechanical mitral valve s/p MV endocarditis, VT, nonischemic cardiomyopathy, complete heart block was referred by Dr. Tamala Julian for evaluation of CHF.  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 He subsequently developed a presumed nonischemic cardiomyopathy.  This was complicated by VT, he had a VT ablation in 2017 and had a Medtronic CRT-D device implanted. This had to be removed due to infection in 11/12 and was reimplanted in 1/13. Last echo in 5/22 showed EF 30% with normal RV, mechanical MV stable with mean gradient 3 mmHg.  He has been in atrial fibrillation persistently since late 2021.  He is on amiodarone and has been cardioverted in the past. His LV lead is not functional.  He has complete heart block and is persistently RV pacing.   Had [place] 02/26/21 RA 4, PA 32/12, PCWP 11, CO 4.6, CI 2.2. Normal filling pressures and slightly low cardiac output.   Digoxin was stopped due to elevated level.   He saw Dr. Lovena Le in 9/22 and was in slow VT in the office.  He was paced out of VT and amiodarone was increased to 200 mg daily.   He saw Dr. Lovena Le, and placement of a left bundle or LV lead was decided to be too risky a procedure.  He was referred to Dr. Oneida Alar with the hope that he could have a VT ablation and get off amiodarone, which is likely playing a role in his symptoms.     S/p VT ablation with Dr. Oneida Alar 1/23.  Echo in 4/23 showed EF 20-25% with diffuse hypokinesis and septal-lateral dyssynchrony, mildly decreased RV systolic function, mechanical MV with normal function, dilated IVC.   He returns for followup of CHF.  Since last appointment with me, patient has been in atrial flutter persistently except for a few days.  He has also had runs relatively slow VT terminated by ATP.  Most recently, he had ATP earlier this month for VT.   Amiodarone has been increased by Dr. Lovena Le up to 200 mg daily.  So far he is tolerating this.  He reports a jump in his HR from 70s to 120s on Wednesday.  Looking at ECG, it appears that he is in atrial flutter in the atria but also with slow VT.  He has felt worse since Wednesday, weak and short of breath.  He is dyspneic after walking 50 feet.  Occasional atypical chest pain.  No lightheadedness or syncope.     Medtronic device interrogation: thoracic impedence stable, AFL consistently since June with small break in NSR prior to that.  Had ATP on 02/04/22 for VT.   ECG (personally reviewed): Atrial flutter + slow VT rate 120s  Labs (7/22): K 4.6, creatinine 1.3 Labs (8/22): K 4.4, creatinine 1.23 Labs (10/22): K 3.9, creatinine 1.36 Labs (12/22): K 4.2, creatinine 1.39 Labs (1/23): K 4.3, creatinine 1.15 Labs (2/23): TSH 10, K 4.1, creatinine 1.54 Labs (6/23): BNP 798, K 4.1, creatinine 1.3  PMH: 1. 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 2. H/o VT: VT ablation in 2017.  - Redo VT ablation in 1/23 with Dr. Oneida Alar 3. Atrial fibrillation/atypical flutter: Persistent since 2021.  Failed amiodarone.  4. PAD: Tibial occlusive disease.  5. 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  6. Hypothyroidism.  7. Chronic systolic CHF: Presumed nonischemic cardiomyopathy.  Medtronic CRT-D device, LV lead is nonfunctional.  -  Echo (2012): EF 45% - Echo (1/17): EF 30-35% - Echo (6/21): EF 15-20% - Echo (11/21): EF 30-35%  - Echo (5/22): EF 30%, normal RV, mechanical mitral valve mean gradient 3 mmHg.  - RHC (8/22): RA mean 4, RV 33/4,PA 32/12 mean 21, PCWP mean 11, Oxygen saturations:PA 71%, AO 98%, Cardiac Output (Fick) 4.66 Cardiac Index (Fick) 2.18 - Echo (4/23): EF 20-25% with diffuse hypokinesis and septal-lateral dyssynchrony, mildly decreased RV systolic function, mechanical MV with normal function, dilated IVC.   Social History   Socioeconomic History   Marital  status: Married    Spouse name: Not on file   Number of children: 1   Years of education: 46   Highest education level: Master's degree (e.g., MA, MS, MEng, MEd, MSW, MBA)  Occupational History   Occupation: retired from [employer], manufacturing systems  Tobacco Use   Smoking status: Never   Smokeless tobacco: Never  Vaping Use   Vaping Use: Never used  Substance and Sexual Activity   Alcohol use: Yes    Alcohol/week: 0.0 standard drinks of alcohol    Comment: occasionally   Drug use: No   Sexual activity: Not on file  Other Topics Concern   Not on file  Social History Narrative   Lives with wife in a one story home.  Has one daughter.  Retired.  Education: Masters.   Social Determinants of Health   Financial Resource Strain: Not on file  Food Insecurity: Not on file  Transportation Needs: Not on file  Physical Activity: Not on file  Stress: Not on file  Social Connections: Not on file  Intimate Partner Violence: Not on file   Family History  Problem Relation Age of Onset   Heart disease Mother    Heart failure Mother    Heart disease Father    Heart failure Father    ROS: All systems reviewed and negative except as per HPI.   Current Outpatient Medications  Medication Sig Dispense Refill   acetaminophen (TYLENOL) 500 MG tablet Take 1,000 mg by mouth every 6 (six) hours as needed (pain).     amiodarone (PACERONE) 200 MG tablet Take 200 mg by mouth daily.     amoxicillin (AMOXIL) 500 MG capsule TAKE 4 CAPSULES BY MOUTH 30 MINUTES PRIOR TO DENTAL WORK 4 capsule 2   furosemide (LASIX) 20 MG tablet TAKE 2 TABLETS (40 MG TOTAL) BY MOUTH EVERY MORNING AND 1 TABLET (20 MG TOTAL) EVERY EVENING. 270 tablet 2   levothyroxine (SYNTHROID) 50 MCG tablet Take 1.5 tablets (75 mcg total) by mouth daily before breakfast. 90 tablet 2   metoprolol succinate (TOPROL-XL) 25 MG 24 hr tablet TAKE 1/2 TABLETS BY MOUTH DAILY. TAKE WITH OR IMMEDIATELY FOLLOWING A MEAL. 45 tablet 3   Multiple Vitamin  (MULTIVITAMIN WITH MINERALS) TABS tablet Take 1 tablet by mouth in the morning.     nitroGLYCERIN (NITROSTAT) 0.4 MG SL tablet Place 1 tablet (0.4 mg total) under the tongue every 5 (five) minutes x 3 doses as needed for chest pain. 25 tablet 2   spironolactone (ALDACTONE) 25 MG tablet Take 0.5 tablets (12.5 mg total) by mouth daily. 90 tablet 3   valsartan (DIOVAN) 40 MG tablet Take 40 mg by mouth 2 (two) times daily.     VERQUVO 5 MG TABS TAKE 1 TABLET (5 MG TOTAL) BY MOUTH DAILY. 30 tablet 6   warfarin (COUMADIN) 5 MG tablet TAKE AS DIRECTED BY THE COUMADIN CLINIC 80 tablet 1   No current facility-administered  medications for this encounter.   BP 110/70   Pulse (!) 118   Wt 91 kg (200 lb 9.6 oz)   SpO2 97%   BMI 25.76 kg/m   Wt Readings from Last 3 Encounters:  02/19/22 91 kg (200 lb 9.6 oz)  12/29/21 90.6 kg (199 lb 12.8 oz)  12/15/21 89.4 kg (197 lb)   General: NAD Neck: JVP 8 cm, no thyromegaly or thyroid nodule.  Lungs: Clear to auscultation bilaterally with normal respiratory effort. CV: Nondisplaced PMI.  Heart tachy, regular S1/S2, mechanical S1, +S3, no murmur.  No peripheral edema.  No carotid bruit.  Normal pedal pulses.  Abdomen: Soft, nontender, no hepatosplenomegaly, no distention.  Skin: Intact without lesions or rashes.  Neurologic: Alert and oriented x 3.  Psych: Normal affect. Extremities: No clubbing or cyanosis.  HEENT: Normal.   Assessment/Plan: 1. Chronic systolic CHF: Long-standing, suspected nonischemic cardiomyopathy.  Echo today showed  EF 20-25% with diffuse hypokinesis and septal-lateral dyssynchrony, mildly decreased RV systolic function, mechanical MV with normal function, dilated IVC.  He has a Medtronic CRT-D device present, but the LV lead is not functional.  He has complete heart block, so has been RV paced chronically.   He is predominantly in atrial flutter/fibrillation but does have short periods of NSR.  Had [place] on 02/26/21 with normal filling  pressures and mildly low cardiac output.  He had been doing well until Wednesday, now NYHA class III symptoms.  Suspect this is due to onset of slow VT.  Mild volume overload by exam, thoracic impedance on device does not suggest volume overload.   - For now, continue Lasix 40 qam/20 qpm.  BMET/BNP today.  - He has only been taking spironolactone 12.5 mg qod, I asked him to take this daily.    - Off digoxin due to persistently elevated level.  - With orthostatic symptoms, he is now off Entresto and on valsartan 20 mg bid (continue).   - Continue Toprol XL 12.5 mg daily at bedtime.  - Continue Verquvo 5 mg daily.   - The patient has CHB and has been chronically RV pacing as his LV lead is nonfunctional (when not in VT).  This is not ideal and creates dyssynchrony.  Remains symptomatic with limited ability to titrate meds. He has seen Dr. Lovena Le, it would be possible to place left bundle lead but procedure would be complicated.  After further discussion with Dr. Lovena Le, it was decided that risk would be too high and left bundle lead will not be attempted.   2. Atrial fibrillation/flutter: Persistent since 2021 with short periods of NSR.  He does not hold NSR despite amiodarone.  Today, he is in atrial flutter.   - Previously recommended against repeat DCCV as unlikely to hold NSR long-term. - He is on amiodarone 200 mg daily.  Check LFTs and TSH.  He will need a regular eye exam.  - Continue Warfarin.  3. Mechanical mitral valve: Valve looked normal on 4/23 echo.   - Continue warfarin, INR goal 3-3.5.  - He has not been on ASA.  4. VT: History of VT and VT ablation in 2017.  MDT ICD.  Redo VT ablation in 1/23 (Dr. Oneida Alar).  He is on amiodarone, this was increased to 200 mg daily recently with recurrent VT.  He has tolerated amiodarone poorly historically.  Today, he is in flutter in the atria and slow VT in the 120s as well.  The slow VT has likely led to his worsening  symptoms. Dr. Caryl Comes came down  to clinic today and was able to pace him out of VT.   - We set ATP to activate at a rate of 109 to be able to terminate slow VT.  - Continue amiodarone 200 mg daily, check LFTs and TSH today.  Hopefully he will tolerate.  - We discussed addition of quinidine.  This will be an option, but on discussion with Dr. Caryl Comes, I elected to leave him off this for now since we reset his ATP threshold.  5. Complete heart block: Per notes, patient is pacer dependent due to CHB.  - See above, upgrade to CRT thought to be too risky.    Followup in 2 wks.   Loralie Champagne, MD 02/19/2022

## 2022-02-19 NOTE — Consult Note (Addendum)
ELECTROPHYSIOLOGY CONSULT NOTE  Patient ID: Austin [name], MRN: [id number], DOB/AGE: [birth year] 81 y.o. Admit date: 02/19/2022 Date of Consult: 02/19/2022  Primary Physician: Lajean Manes, MD Primary Cardiologist: DM/GT Austin [name] is a 81 y.o. male who is being seen today for the evaluation of tachycardia at the request of DM.   Chief Complaint: tachycardia   HPI Austin [name] is a 81 y.o. male    Asked by Dr. Aundra Dubin to evaluate tachycardia.  Austin [name] has a history of recurrent ventricular tachycardia with ablation x3; I met him in the office 4/23 when he presented with ventricular tachycardia and we were able to successfully pace terminate his ventricular tachycardia.  It was below his detection rate; his VT detection rate was reprogrammed from 150--130.  He presents today with a 2-day history of tachycardia accompanied by lightheadedness fatigue and shortness of breath.  Device interrogation demonstrates that his ventricular tachycardia at 118.  Moreover, and demonstrated about 15 episodes of ventricular tachycardia since reprogramming 4/23 that had been terminated with antitachycardia pacing; the data available since early June 2023 demonstrated that all but 1 VT's were terminated with ATP x1; the other 1 required ATP x2.      Past Medical History:  Diagnosis Date   AICD (automatic cardioverter/defibrillator) present    Atrial fibrillation ([place])    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    cx by infection, explantation11/12 & reimplant 1/13   Conductive hearing loss    Intraspinal abscess    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   Nonischemic cardiomyopathy ([place])    Psychosexual dysfunction with inhibited sexual excitement    S/P mitral valve replacement    Syncope and collapse    Unspecified sleep apnea    last sleep study 11/07   Ventricular tachycardia ([place])    VT (ventricular tachycardia)  ([place])        Surgical History:  Past Surgical History:  Procedure Laterality Date   BIV ICD GENERATOR CHANGEOUT N/A 05/04/2017   Procedure: BiV ICD Generator Changeout;  Surgeon: Evans Lance, MD;  Location: [facility];  Service: Cardiovascular;  Laterality: N/A;   CARDIAC CATHETERIZATION  04/24/2002   CARDIAC VALVE REPLACEMENT     CARDIOVERSION  03/09/2012   Procedure: CARDIOVERSION;  Surgeon: Evans Lance, MD;  Location: [facility];  Service: Cardiovascular;  Laterality: N/A;   CARDIOVERSION N/A 11/22/2013   Procedure: CARDIOVERSION;  Surgeon: Sanda , MD;  Location: [facility];  Service: Cardiovascular;  Laterality: N/A;   dental implants     ELECTROPHYSIOLOGIC STUDY N/A 09/15/2015   Procedure: Stephanie Coup Ablation;  Surgeon: Evans Lance, MD;  Location: [facility];  Service: Cardiovascular;  Laterality: N/A;   Evacution of epidural lumbar epidural abscess  1999   INSERT / REPLACE / REMOVE PACEMAKER  11/2008   MITRAL VALVE REPLACEMENT     w [id number] [facility]   PERMANENT PACEMAKER INSERTION N/A 08/05/2011   Procedure: PERMANENT PACEMAKER INSERTION;  Surgeon: Evans Lance, MD;  Location: [facility];  Service: Cardiovascular;  Laterality: N/A;   RIGHT HEART CATH N/A 02/26/2021   Procedure: RIGHT HEART CATH;  Surgeon: Larey Dresser, MD;  Location: [facility];  Service: Cardiovascular;  Laterality: N/A;   THYROIDECTOMY     TONSILLECTOMY     VALVE REPLACEMENT  2000    Current Meds  Medication Sig   acetaminophen (TYLENOL) 500 MG  tablet Take 1,000 mg by mouth every 6 (six) hours as needed (pain).   amiodarone (PACERONE) 200 MG tablet Take 200 mg by mouth daily.   amoxicillin (AMOXIL) 500 MG capsule TAKE 4 CAPSULES BY MOUTH 30 MINUTES PRIOR TO DENTAL WORK   furosemide (LASIX) 20 MG tablet TAKE 2 TABLETS (40 MG TOTAL) BY MOUTH EVERY MORNING AND 1 TABLET (20 MG TOTAL) EVERY EVENING.   levothyroxine (SYNTHROID) 50 MCG tablet Take 1.5 tablets (75 mcg total) by  mouth daily before breakfast.   metoprolol succinate (TOPROL-XL) 25 MG 24 hr tablet TAKE 1/2 TABLETS BY MOUTH DAILY. TAKE WITH OR IMMEDIATELY FOLLOWING A MEAL.   Multiple Vitamin (MULTIVITAMIN WITH MINERALS) TABS tablet Take 1 tablet by mouth in the morning.   nitroGLYCERIN (NITROSTAT) 0.4 MG SL tablet Place 1 tablet (0.4 mg total) under the tongue every 5 (five) minutes x 3 doses as needed for chest pain.   spironolactone (ALDACTONE) 25 MG tablet Take 0.5 tablets (12.5 mg total) by mouth daily.   valsartan (DIOVAN) 40 MG tablet Take 40 mg by mouth 2 (two) times daily.   VERQUVO 5 MG TABS TAKE 1 TABLET (5 MG TOTAL) BY MOUTH DAILY.   warfarin (COUMADIN) 5 MG tablet TAKE AS DIRECTED BY THE COUMADIN CLINIC   [DISCONTINUED] valsartan (DIOVAN) 40 MG tablet Take 40 mg by mouth 3 (three) times a week.    Allergies: No Known Allergies  Social History   Socioeconomic History   Marital status: Married    Spouse name: Not on file   Number of children: 1   Years of education: 59   Highest education level: Master's degree (e.g., MA, MS, MEng, MEd, MSW, MBA)  Occupational History   Occupation: retired from [employer], manufacturing systems  Tobacco Use   Smoking status: Never   Smokeless tobacco: Never  Vaping Use   Vaping Use: Never used  Substance and Sexual Activity   Alcohol use: Yes    Alcohol/week: 0.0 standard drinks of alcohol    Comment: occasionally   Drug use: No   Sexual activity: Not on file  Other Topics Concern   Not on file  Social History Narrative   Lives with wife in a one story home.  Has one daughter.  Retired.  Education: Masters.   Social Determinants of Health   Financial Resource Strain: Not on file  Food Insecurity: Not on file  Transportation Needs: Not on file  Physical Activity: Not on file  Stress: Not on file  Social Connections: Not on file  Intimate Partner Violence: Not on file     Family History  Problem Relation Age of Onset   Heart disease Mother    Heart  failure Mother    Heart disease Father    Heart failure Father      ROS:  Please see the history of present illness.     All other systems reviewed and negative.    Physical Exam: Blood pressure 110/70, pulse (!) 118, weight 91 kg (200 lb 9.6 oz), SpO2 97 %. General: Well developed, well nourished male in no acute distress. Head: Normocephalic, atraumatic, sclera non-icteric, no xanthomas, nares are without discharge. EENT: normal Lymph Nodes:  none Back: without scoliosis/kyphosis , no CVA tendersness Lungs: Clear bilaterally to auscultation without wheezes, rales, or rhonchi. Breathing is unlabored. Heart: Rapid but RR with S1 S2. No  murmur , rubs, or gallops appreciated. Abdomen: Soft,  Msk:  Strength and tone appear normal for age. Extremities: No clubbing or cyanosis edema.  Distal pedal pulses are 2+ and equal bilaterally. Skin: Warm and Dry Neuro: Alert and oriented X 3. CN III-XII intact Grossly normal sensory and motor function . Psych:  Responds to questions appropriately with a normal affect.      Labs: Cardiac Enzymes No results for input(s): "CKTOTAL", "CKMB", "TROPONINI" in the last 72 hours. CBC Lab Results  Component Value Date   WBC 7.2 06/12/2021   HGB 13.5 06/12/2021   HCT 42.9 06/12/2021   MCV 98.2 06/12/2021   PLT 151 06/12/2021   PROTIME: No results for input(s): "LABPROT", "INR" in the last 72 hours. Chemistry No results for input(s): "NA", "K", "CL", "CO2", "BUN", "CREATININE", "CALCIUM", "PROT", "BILITOT", "ALKPHOS", "ALT", "AST", "GLUCOSE" in the last 168 hours.  Invalid input(s): "LABALBU" Lipids Lab Results  Component Value Date   CHOL 158 08/23/2015   HDL 50 08/23/2015   LDLCALC 82 08/23/2015   TRIG 130 08/23/2015   BNP NT-Pro BNP  Date/Time Value Ref Range Status  11/05/2020 02:11 PM 1,191 (H) 0 - 486 pg/mL Final    Comment:    The following cut-points have been suggested for the use of proBNP for the diagnostic evaluation of  heart failure (HF) in patients with acute dyspnea: Modality                     Age           Optimal Cut                            (years)            Point ------------------------------------------------------ Diagnosis (rule in HF)        <50            450 pg/mL                           50 - 75            900 pg/mL                               >75           1800 pg/mL Exclusion (rule out HF)  Age independent     300 pg/mL   01/14/2020 08:48 AM 1,971 (H) 0 - 486 pg/mL Final    Comment:    The following cut-points have been suggested for the use of proBNP for the diagnostic evaluation of heart failure (HF) in patients with acute dyspnea: Modality                     Age           Optimal Cut                            (years)            Point ------------------------------------------------------ Diagnosis (rule in HF)        <50            450 pg/mL                           50 - 75            900 pg/mL                               >  75           1800 pg/mL Exclusion (rule out HF)  Age independent     300 pg/mL    Thyroid Function Tests: No results for input(s): "TSH", "T4TOTAL", "T3FREE", "THYROIDAB" in the last 72 hours.  Invalid input(s): "FREET3"         EKG: Ventricular tachycardia at 116 with monophasic upright QRS V1-V6   Assessment and Plan:  Ventricular tachycardia-recurrent below detection of his ICD  Atrial flutter-concurrent  ICD-Medtronic  Mitral valve disease status post mechanical replacement and endocarditis  Nonischemic cardiomyopathy    With this, we paced terminated his ventricular tachycardia today.  It was at 68% of the RR interval.  I have reprogrammed his device to detect VT faster than 109.  I have created a burst algorithm x4 in therapy 1 and therapy 2, the idea being to prevent excessively rapid pacing efforts, the consequences of ever shortening cycle length.   With this in mind, have discussed with Dr. DM and we will hold off on  quinidine at this point as successful ATP albeit relatively frequently i.e. 15 x 90 days, is likely associated with fewer untoward consequences than quinidine.  For now, we will continue him on amiodarone at 200 mg a day. [ Virl Axe

## 2022-02-19 NOTE — Telephone Encounter (Signed)
Pharmacy Patient Advocate Encounter  Insurance verification completed.    The patient is insured through [employer] Part D   The patient is currently admitted and ran test claims for the following: quinidine sulfate 200 mg tablets and quinidine sulfate 300 mg tablets..  Copays and coinsurance results were relayed to Inpatient clinical team.

## 2022-02-20 LAB — T3, FREE: T3, Free: 2.4 pg/mL (ref 2.0–4.4)

## 2022-02-22 ENCOUNTER — Ambulatory Visit (INDEPENDENT_AMBULATORY_CARE_PROVIDER_SITE_OTHER): Payer: Medicare Other

## 2022-02-22 ENCOUNTER — Encounter: Payer: Self-pay | Admitting: Internal Medicine

## 2022-02-22 ENCOUNTER — Telehealth: Payer: Self-pay

## 2022-02-22 ENCOUNTER — Other Ambulatory Visit: Payer: Self-pay | Admitting: Internal Medicine

## 2022-02-22 DIAGNOSIS — Z95810 Presence of automatic (implantable) cardiac defibrillator: Secondary | ICD-10-CM

## 2022-02-22 DIAGNOSIS — I5022 Chronic systolic (congestive) heart failure: Secondary | ICD-10-CM

## 2022-02-22 NOTE — Telephone Encounter (Signed)
Device alert for VT with therapy Event occurred 7/30 @ 04:45, EGM shows flutter with slow VT, 113-118 bpm.  ATP delivered unsuccessfully x8 followed by HV therapy 20J, converting rhythm to flutter/VP/VS Pt. was paced out of VT 7/28, VT set to 109, Amiodarone was increased.  Patient called, started the Amio 200 mg last Thursday. States he had several days where he felt "great", and Sunday did not feel great when he received his shock. Reports today he is better but still not back to feeling great. Patient denies any chest pain, shortness of breath, palpitations or dizziness. Checked BP this morning and was 113/78, HR: 68 bpm. Educated patient on shock plan and driving restrictions per [place] DMV x6 months starting on 02/21/22. Patient voiced understanding to both. Patient made aware of plan noted below and agrees to call if any changes in his condition.   Spoke to [place], RN states she is familiar with patient and recent medication changes. She will review with Dr. Lovena Le when he returns to the office tomorrow 02/23/22.

## 2022-02-23 ENCOUNTER — Other Ambulatory Visit: Payer: Self-pay | Admitting: Internal Medicine

## 2022-02-23 MED ORDER — QUINIDINE GLUCONATE ER 324 MG PO TBCR: 324.0000 mg | EXTENDED_RELEASE_TABLET | Freq: Two times a day (BID) | ORAL | 11 refills | Status: DC

## 2022-02-23 MED ORDER — LEVOTHYROXINE SODIUM 50 MCG PO TABS: 75.0000 ug | ORAL_TABLET | Freq: Every day | ORAL | 2 refills | Status: DC

## 2022-02-23 NOTE — Telephone Encounter (Signed)
Per Dr. Truddie Coco quinidine gluconate CR 324 mg PO BID  Pt aware.  Prescription sent to pharmacy.  All questions answered.  Pt has f/u scheduled with HF.

## 2022-02-23 NOTE — Progress Notes (Signed)
EPIC Encounter for ICM Monitoring  Patient Name: Austin [name] is a 81 y.o. male Date: 02/23/2022 Primary Care Physican: Lajean Manes, MD Primary Cardiologist: Smith/McLean Electrophysiologist: Lovena Le 09/02/2021 Weight: 194.7 lbs 12/23/2021 Weight: 194.7 lbs 02/23/2022 Weight: 195 lbs   Treated Since 19-Feb-2022 VT         6 Monitored Since 19-Feb-2022 VT-NS (>4 beats, >109 bpm) 1 SVT: VT/VF Rx Withheld            1 AT/AF  1 Time in AT/AF  24 hr/day (100%) Longest AT/AF 3 days   Therapy Summary VT/VF  Pace-Terminated Episodes 0 of 1  Shock-Terminated Episodes 1 of 1                 Spoke with patient and heart failure questions reviewed.  Pt received device shock Sunday morning around 5 PM.    Optivol Thoracic impedance suggesting possible fluid accumulation from 7/25 and returned to normal 7/30.  Device shock has been addressed with patient by device clinic nurse 7/31.   Prescribed:  Furosemide 20 mg Take 2 tablets (40 mg total) by mouth every morning AND 1 tablet (20 mg total) every evening.   He self adjusts Furosemide when needed.    Labs: 02/19/2022 Creatinine 1.49, BUN 35, Potassium 4.2, Sodium 139, GFR 12/29/2021 Creatinine 1.30, BUN 30, Potassium 4.1, Sodium 139, GFR 56 11/17/2021 Creatinine 1.58, BUN 35, Potassium 4.4, Sodium 134, GFR 44  11/06/2021 Creatinine 1.70, BUN 35, Potassium 4.2, Sodium 135, GFR 41  10/27/2021 Creatinine 1.54, BUN 30, Potassium 4.7, Sodium 141, GFR 46  08/26/2021 Creatinine 1.54, BUN 24, Potassium 4.1, Sodium 137, GFR 46 07/31/2021 Creatinine 1.15, BUN 15, Potassium 4.3, Sodium 139, GFR 65 07/30/2021 Creatinine 1.28, BUN 21, Potassium 3.7, Sodium 141, GFR 57 A complete set of results can be found in Results Review.   Recommendations: No changes and encouraged to call if experiencing any fluid symptoms.    Follow-up plan: ICM clinic phone appointment on 03/30/2022.   91 day device clinic remote transmission 03/24/2022.      EP/Cardiology  Office Visits:  03/02/2022 with Dr Aundra Dubin.   06/10/2022 with Dr Lovena Le.  Recall 10/10/2022 with Dr Tamala Julian.     Copy of ICM check sent to Dr. Lovena Le.     3 month ICM trend: 02/21/2022.    12-14 Month ICM trend:     [name], RN 02/23/2022 4:33 PM

## 2022-02-23 NOTE — Addendum Note (Signed)
Addended by: Willeen Cass A on: 02/23/2022 05:31 PM   Modules accepted: Orders

## 2022-02-25 ENCOUNTER — Ambulatory Visit (INDEPENDENT_AMBULATORY_CARE_PROVIDER_SITE_OTHER): Payer: Medicare Other

## 2022-02-25 DIAGNOSIS — Z952 Presence of prosthetic heart valve: Secondary | ICD-10-CM | POA: Diagnosis not present

## 2022-02-25 DIAGNOSIS — I4891 Unspecified atrial fibrillation: Secondary | ICD-10-CM

## 2022-02-25 DIAGNOSIS — Z5181 Encounter for therapeutic drug level monitoring: Secondary | ICD-10-CM | POA: Diagnosis not present

## 2022-02-25 LAB — POCT INR: INR: 2.6 (ref 2.0–3.0)

## 2022-02-25 NOTE — Patient Instructions (Signed)
Description   Spoke with pt and instructed to take 1 tablet today and then continue taking warfarin 1/2 tablet daily except for 1 tablet on Mondays, Wednesdays, and Fridays. Recheck INR in 2 weeks. Coumadin Clinic [phone].  AMIO DOSE INCREASED TO '400MG'$  ON SAT & SUN ALL OTHER DAYS STAY AT '200MG'$ .   Pt's cell phone [phone] in case difficulty reaching pt on home phone.

## 2022-03-02 ENCOUNTER — Ambulatory Visit (HOSPITAL_COMMUNITY)
Admission: RE | Admit: 2022-03-02 | Discharge: 2022-03-02 | Disposition: A | Discharge status: Home / Self Care | Payer: Medicare Other | Source: Ambulatory Visit | Attending: Cardiology | Admitting: Cardiology

## 2022-03-02 ENCOUNTER — Other Ambulatory Visit (HOSPITAL_COMMUNITY): Payer: Self-pay

## 2022-03-02 ENCOUNTER — Encounter (HOSPITAL_COMMUNITY): Payer: Self-pay | Admitting: Cardiology

## 2022-03-02 VITALS — BP 94/60 | HR 64 | Wt 200.0 lb

## 2022-03-02 DIAGNOSIS — Z79890 Hormone replacement therapy (postmenopausal): Secondary | ICD-10-CM | POA: Diagnosis not present

## 2022-03-02 DIAGNOSIS — I4891 Unspecified atrial fibrillation: Secondary | ICD-10-CM

## 2022-03-02 DIAGNOSIS — Z8249 Family history of ischemic heart disease and other diseases of the circulatory system: Secondary | ICD-10-CM | POA: Insufficient documentation

## 2022-03-02 DIAGNOSIS — Z950 Presence of cardiac pacemaker: Secondary | ICD-10-CM | POA: Diagnosis not present

## 2022-03-02 DIAGNOSIS — I7789 Other specified disorders of arteries and arterioles: Secondary | ICD-10-CM | POA: Insufficient documentation

## 2022-03-02 DIAGNOSIS — I4819 Other persistent atrial fibrillation: Secondary | ICD-10-CM | POA: Diagnosis not present

## 2022-03-02 DIAGNOSIS — Z952 Presence of prosthetic heart valve: Secondary | ICD-10-CM | POA: Insufficient documentation

## 2022-03-02 DIAGNOSIS — I442 Atrioventricular block, complete: Secondary | ICD-10-CM | POA: Diagnosis not present

## 2022-03-02 DIAGNOSIS — I5022 Chronic systolic (congestive) heart failure: Secondary | ICD-10-CM | POA: Insufficient documentation

## 2022-03-02 DIAGNOSIS — Z7901 Long term (current) use of anticoagulants: Secondary | ICD-10-CM | POA: Insufficient documentation

## 2022-03-02 DIAGNOSIS — Z79899 Other long term (current) drug therapy: Secondary | ICD-10-CM | POA: Insufficient documentation

## 2022-03-02 DIAGNOSIS — E039 Hypothyroidism, unspecified: Secondary | ICD-10-CM | POA: Diagnosis not present

## 2022-03-02 DIAGNOSIS — I4892 Unspecified atrial flutter: Secondary | ICD-10-CM | POA: Diagnosis not present

## 2022-03-02 MED ORDER — VERQUVO 5 MG PO TABS: 7.5000 mg | ORAL_TABLET | Freq: Every day | ORAL | 5 refills | Status: DC

## 2022-03-02 NOTE — Progress Notes (Signed)
PCP: Lajean Manes, MD Cardiology: Dr. Tamala Julian HF Cardiology: Dr. Aundra Dubin EP: Dr. Lovena Le  81 y.o. with history of mechanical mitral valve s/p MV endocarditis, VT, nonischemic cardiomyopathy, complete heart block was referred by Dr. Tamala Julian for evaluation of CHF.  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 He subsequently developed a presumed nonischemic cardiomyopathy.  This was complicated by VT, he had a VT ablation in 2017 and had a Medtronic CRT-D device implanted. This had to be removed due to infection in 11/12 and was reimplanted in 1/13. Last echo in 5/22 showed EF 30% with normal RV, mechanical MV stable with mean gradient 3 mmHg.  He has been in atrial fibrillation persistently since late 2021.  He is on amiodarone and has been cardioverted in the past. His LV lead is not functional.  He has complete heart block and is persistently RV pacing.   Had [place] 02/26/21 RA 4, PA 32/12, PCWP 11, CO 4.6, CI 2.2. Normal filling pressures and slightly low cardiac output.   Digoxin was stopped due to elevated level.   He saw Dr. Lovena Le in 9/22 and was in slow VT in the office.  He was paced out of VT and amiodarone was increased to 200 mg daily.   He saw Dr. Lovena Le, and placement of a left bundle or LV lead was decided to be too risky a procedure.  He was referred to Dr. Oneida Alar with the hope that he could have a VT ablation and get off amiodarone, which is likely playing a role in his symptoms.     S/p VT ablation with Dr. Oneida Alar 1/23.  Echo in 4/23 showed EF 20-25% with diffuse hypokinesis and septal-lateral dyssynchrony, mildly decreased RV systolic function, mechanical MV with normal function, dilated IVC.   He was seen in the office in 7/23, noted to be in atrial flutter in the atria and slow VT in the ventricles.  VT was pace terminated by Dr. Caryl Comes and lower threshold set for ATP.  Later in 7/23, he had another VT episode and had an ICD discharge.   Quinidine was then started by Dr. Lovena Le.   He returns for followup of CHF.  Today, I suspect that he is in slow atrial flutter though device check reads as NSR.  Weight is stable. Occasional lightheadedness with standing but not severe. No dyspnea walking on flat ground.  No chest pain.  No orthopnea/PND.  He is short of breath walking longer distances.  SBP remains in 90s.   Medtronic device interrogation: thoracic impedence stable, VT with ICD shock in 7/23, possible NSR currently.   ECG (personally reviewed): Slow atrial flutter (versus NSR with 1st degree AVB), RV paced  Labs (7/22): K 4.6, creatinine 1.3 Labs (8/22): K 4.4, creatinine 1.23 Labs (10/22): K 3.9, creatinine 1.36 Labs (12/22): K 4.2, creatinine 1.39 Labs (1/23): K 4.3, creatinine 1.15 Labs (2/23): TSH 10, K 4.1, creatinine 1.54 Labs (6/23): BNP 798, K 4.1, creatinine 1.3 Labs (7/23): BNP 1244, LFTs normal, K 4.2, creatinine 1.49  PMH: 1. 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 2. H/o VT: VT ablation in 2017.  - Redo VT ablation in 1/23 with Dr. Oneida Alar - Claudia Desanctis termination of VT in office in 7/23.  - ICD shock 7/23, started quinidine.  3. Atrial fibrillation/atypical flutter: Persistent since 2021.  Failed amiodarone.  4. PAD: Tibial occlusive disease.  5. 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  6. Hypothyroidism.  7. Chronic systolic CHF: Presumed nonischemic  cardiomyopathy.  Medtronic CRT-D device, LV lead is nonfunctional.  - Echo (2012): EF 45% - Echo (1/17): EF 30-35% - Echo (6/21): EF 15-20% - Echo (11/21): EF 30-35%  - Echo (5/22): EF 30%, normal RV, mechanical mitral valve mean gradient 3 mmHg.  - RHC (8/22): RA mean 4, RV 33/4,PA 32/12 mean 21, PCWP mean 11, Oxygen saturations:PA 71%, AO 98%, Cardiac Output (Fick) 4.66 Cardiac Index (Fick) 2.18 - Echo (4/23): EF 20-25% with diffuse hypokinesis and septal-lateral dyssynchrony, mildly decreased RV systolic function, mechanical MV  with normal function, dilated IVC.   Social History   Socioeconomic History   Marital status: Married    Spouse name: Not on file   Number of children: 1   Years of education: 57   Highest education level: Master's degree (e.g., MA, MS, MEng, MEd, MSW, MBA)  Occupational History   Occupation: retired from [employer], manufacturing systems  Tobacco Use   Smoking status: Never   Smokeless tobacco: Never  Vaping Use   Vaping Use: Never used  Substance and Sexual Activity   Alcohol use: Yes    Alcohol/week: 0.0 standard drinks of alcohol    Comment: occasionally   Drug use: No   Sexual activity: Not on file  Other Topics Concern   Not on file  Social History Narrative   Lives with wife in a one story home.  Has one daughter.  Retired.  Education: Masters.   Social Determinants of Health   Financial Resource Strain: Not on file  Food Insecurity: Not on file  Transportation Needs: Not on file  Physical Activity: Not on file  Stress: Not on file  Social Connections: Not on file  Intimate Partner Violence: Not on file   Family History  Problem Relation Age of Onset   Heart disease Mother    Heart failure Mother    Heart disease Father    Heart failure Father    ROS: All systems reviewed and negative except as per HPI.   Current Outpatient Medications  Medication Sig Dispense Refill   acetaminophen (TYLENOL) 500 MG tablet Take 1,000 mg by mouth every 6 (six) hours as needed (pain).     amiodarone (PACERONE) 200 MG tablet Take 200 mg by mouth daily.     amoxicillin (AMOXIL) 500 MG capsule TAKE 4 CAPSULES BY MOUTH 30 MINUTES PRIOR TO DENTAL WORK 4 capsule 2   furosemide (LASIX) 20 MG tablet TAKE 2 TABLETS (40 MG TOTAL) BY MOUTH EVERY MORNING AND 1 TABLET (20 MG TOTAL) EVERY EVENING. 270 tablet 2   levothyroxine (SYNTHROID) 75 MCG tablet Take 1 tablet (75 mcg total) by mouth daily before breakfast. 90 tablet 3   metoprolol succinate (TOPROL-XL) 25 MG 24 hr tablet TAKE 1/2 TABLETS BY MOUTH  DAILY. TAKE WITH OR IMMEDIATELY FOLLOWING A MEAL. 45 tablet 3   Multiple Vitamin (MULTIVITAMIN WITH MINERALS) TABS tablet Take 1 tablet by mouth in the morning.     nitroGLYCERIN (NITROSTAT) 0.4 MG SL tablet Place 1 tablet (0.4 mg total) under the tongue every 5 (five) minutes x 3 doses as needed for chest pain. 25 tablet 2   quiniDINE gluconate 324 MG CR tablet Take 1 tablet (324 mg total) by mouth 2 (two) times daily. 60 tablet 11   spironolactone (ALDACTONE) 25 MG tablet Take 0.5 tablets (12.5 mg total) by mouth daily. 90 tablet 3   valsartan (DIOVAN) 40 MG tablet Take 40 mg by mouth 2 (two) times daily.     warfarin (COUMADIN) 5 MG  tablet TAKE AS DIRECTED BY THE COUMADIN CLINIC 80 tablet 1   Vericiguat (VERQUVO) 5 MG TABS Take 1.5 tablets (7.5 mg total) by mouth daily. 90 tablet 5   No current facility-administered medications for this encounter.   BP 94/60   Pulse 64   Wt 90.7 kg (200 lb)   SpO2 95%   BMI 25.68 kg/m   Wt Readings from Last 3 Encounters:  03/02/22 90.7 kg (200 lb)  02/19/22 91 kg (200 lb 9.6 oz)  12/29/21 90.6 kg (199 lb 12.8 oz)   General: NAD Neck: No JVD, no thyromegaly or thyroid nodule.  Lungs: Clear to auscultation bilaterally with normal respiratory effort. CV: Nondisplaced PMI.  Heart regular S1/S2, no S3/S4, 1/6 SEM RUSB.  1+ ankle edema.  No carotid bruit.  Normal pedal pulses.  Abdomen: Soft, nontender, no hepatosplenomegaly, no distention.  Skin: Intact without lesions or rashes.  Neurologic: Alert and oriented x 3.  Psych: Normal affect. Extremities: No clubbing or cyanosis.  HEENT: Normal.   Assessment/Plan: 1. Chronic systolic CHF: Long-standing, suspected nonischemic cardiomyopathy.  Echo in 4/23 showed  EF 20-25% with diffuse hypokinesis and septal-lateral dyssynchrony, mildly decreased RV systolic function, mechanical MV with normal function, dilated IVC.  He has a Medtronic CRT-D device present, but the LV lead is not functional.  He has  complete heart block, so has been RV paced chronically.   He is predominantly in atrial flutter/fibrillation but does have possible short periods of NSR.  Had [place] on 02/26/21 with normal filling pressures and mildly low cardiac output.  He is not volume overloaded by exam or Optivol. NYHA class II-III symptoms.  - Continue Lasix 40 qam/20 qpm.  Recent BMET stable.  - Continue spironolactone 12.5 daily, will not increase due to low BP.   - Off digoxin due to persistently elevated level.  - With orthostatic symptoms, he is now off Entresto and on valsartan 20 mg bid (continue).   - Continue Toprol XL 12.5 mg daily at bedtime.  - Increase Verquvo to 7.5 mg daily.  - The patient has CHB and has been chronically RV pacing as his LV lead is nonfunctional (when not in VT).  This is not ideal and creates dyssynchrony.  Remains symptomatic with limited ability to titrate meds. He has seen Dr. Lovena Le, it would be possible to place left bundle lead but procedure would be complicated.  After further discussion with Dr. Lovena Le, it was decided that risk would be too high and left bundle lead will not be attempted.   2. Atrial fibrillation/flutter: Persistent since 2021 with short periods of NSR.  He does not hold NSR despite amiodarone.  Today, I suspect he is in slow atrial flutter though device interrogation suggests NSR.  - Previously recommended against repeat DCCV as unlikely to hold NSR long-term. - He is on amiodarone 200 mg daily.  LFTs recently normal, he is on Levoxyl for hypothyroidism.  He will need a regular eye exam.  - Continue Warfarin.  3. Mechanical mitral valve: Valve looked normal on 4/23 echo.   - Continue warfarin, INR goal 3-3.5.  - He has not been on ASA.  4. VT: History of VT and VT ablation in 2017.  MDT ICD.  Redo VT ablation in 1/23 (Dr. Oneida Alar).  He is on amiodarone, this was increased to 200 mg daily recently with recurrent VT.  He has tolerated amiodarone poorly historically.  In  7/23, slow VT was pace-terminated in the office, then he had an  ICD shock for VT later in 7/23.  He was started on quinidine after this.   - Continue amiodarone 200 mg daily.  - Continue quinidine.  5. Complete heart block: Per notes, patient is pacer dependent due to CHB.  - See above, upgrade to CRT thought to be too risky.    Followup in 3 months with APP.   Loralie Champagne, MD 03/02/2022

## 2022-03-02 NOTE — Patient Instructions (Signed)
Increase Verquvo to 7.5 mg (1 1/2 Tab) daily.  Your physician recommends that you schedule a follow-up appointment in: 3 months  If you have any questions or concerns before your next appointment please send [place] a message through [place] or call our office at [phone].    TO LEAVE A MESSAGE FOR THE NURSE SELECT OPTION 2, PLEASE LEAVE A MESSAGE INCLUDING: YOUR NAME DATE OF BIRTH CALL BACK NUMBER REASON FOR CALL**this is important as we prioritize the call backs  YOU WILL RECEIVE A CALL BACK THE SAME DAY AS LONG AS YOU CALL BEFORE 4:00 PM  At the [facility], you and your health needs are our priority. As part of our continuing mission to provide you with exceptional heart care, we have created designated Provider Care Teams. These Care Teams include your primary Cardiologist (physician) and Advanced Practice Providers (APPs- Physician Assistants and Nurse Practitioners) who all work together to provide you with the care you need, when you need it.   You may see any of the following providers on your designated Care Team at your next follow up: Dr Glori Bickers Dr Haynes Kerns, NP Lyda Jester, [place] [facility] [place], [place] Audry Riles, PharmD   Please be sure to bring in all your medications bottles to every appointment.

## 2022-03-11 ENCOUNTER — Ambulatory Visit (INDEPENDENT_AMBULATORY_CARE_PROVIDER_SITE_OTHER): Payer: Medicare Other | Admitting: *Deleted

## 2022-03-11 DIAGNOSIS — I4891 Unspecified atrial fibrillation: Secondary | ICD-10-CM

## 2022-03-11 DIAGNOSIS — Z5181 Encounter for therapeutic drug level monitoring: Secondary | ICD-10-CM | POA: Diagnosis not present

## 2022-03-11 LAB — POCT INR: INR: 3.3 — AB (ref 2.0–3.0)

## 2022-03-11 NOTE — Patient Instructions (Signed)
Description   Spoke with pt and instructed to continue taking warfarin 1/2 tablet daily except for 1 tablet on Mondays, Wednesdays, and Fridays. Recheck INR in 2 weeks. Coumadin Clinic [phone].  AMIO DOSE INCREASED TO '400MG'$  ON SAT & SUN ALL OTHER DAYS STAY AT '200MG'$ .   Pt's cell phone [phone] in case difficulty reaching pt on home phone.

## 2022-03-12 ENCOUNTER — Other Ambulatory Visit: Payer: Self-pay | Admitting: Cardiology

## 2022-03-12 ENCOUNTER — Encounter (HOSPITAL_COMMUNITY): Payer: Self-pay | Admitting: Cardiology

## 2022-03-15 ENCOUNTER — Other Ambulatory Visit (HOSPITAL_COMMUNITY): Payer: Self-pay | Admitting: Cardiology

## 2022-03-15 ENCOUNTER — Telehealth (HOSPITAL_COMMUNITY): Payer: Self-pay | Admitting: Cardiology

## 2022-03-15 MED ORDER — VERQUVO 2.5 MG PO TABS: 2.5000 mg | ORAL_TABLET | Freq: Every day | ORAL | 3 refills | Status: DC

## 2022-03-15 NOTE — Telephone Encounter (Signed)
Detailed message left for patient that upcoming appt 10/31 is not available for provider will need to r/s

## 2022-03-16 ENCOUNTER — Encounter: Payer: Self-pay | Admitting: Internal Medicine

## 2022-03-16 MED ORDER — AMIODARONE HCL 200 MG PO TABS: 200.0000 mg | ORAL_TABLET | Freq: Every day | ORAL | 1 refills | Status: DC

## 2022-03-16 NOTE — Telephone Encounter (Signed)
02/18/2022 documentation for Amiodarone increase to '200mg'$  daily per Dr Lovena Le.  Good Afternoon Mr. Disch,   Dr. Lovena Le has reviewed your transmission and would like you to increase your Amiodarone to a full tablet ('200mg'$ ) DAILY. He will be back in the office next week and will decide what further options of treatment to pursue.    If your symptoms worsen, please proceed to the emergency department.    Portia E. Rollene Rotunda, Device RN

## 2022-03-17 NOTE — Telephone Encounter (Signed)
Appt r/s to 10/30

## 2022-03-24 ENCOUNTER — Ambulatory Visit (INDEPENDENT_AMBULATORY_CARE_PROVIDER_SITE_OTHER): Payer: Medicare Other

## 2022-03-24 DIAGNOSIS — I442 Atrioventricular block, complete: Secondary | ICD-10-CM | POA: Diagnosis not present

## 2022-03-25 ENCOUNTER — Ambulatory Visit (INDEPENDENT_AMBULATORY_CARE_PROVIDER_SITE_OTHER): Payer: Medicare Other | Admitting: *Deleted

## 2022-03-25 DIAGNOSIS — Z5181 Encounter for therapeutic drug level monitoring: Secondary | ICD-10-CM | POA: Diagnosis not present

## 2022-03-25 DIAGNOSIS — I4891 Unspecified atrial fibrillation: Secondary | ICD-10-CM

## 2022-03-25 LAB — POCT INR: INR: 3 (ref 2.0–3.0)

## 2022-03-25 NOTE — Patient Instructions (Signed)
Description   Spoke with pt and instructed to continue taking warfarin 1/2 tablet daily except for 1 tablet on Mondays, Wednesdays, and Fridays. Recheck INR in 2 weeks. Coumadin Clinic [phone].  AMIO DOSE INCREASED TO '400MG'$  ON SAT & SUN ALL OTHER DAYS STAY AT '200MG'$ .   Pt's cell phone [phone] in case difficulty reaching pt on home phone.

## 2022-03-28 LAB — CUP PACEART REMOTE DEVICE CHECK
Battery Remaining Longevity: 24 mo
Battery Voltage: 2.95 V
Brady Statistic AP VP Percent: 35.2 %
Brady Statistic AP VS Percent: 1.07 %
Brady Statistic AS VP Percent: 62.24 %
Brady Statistic AS VS Percent: 1.49 %
Brady Statistic RA Percent Paced: 29.7 %
Brady Statistic RV Percent Paced: 97.43 %
Date Time Interrogation Session: 20230830001804
HighPow Impedance: 75 Ohm
Implantable Lead Implant Date: 20130110
Implantable Lead Implant Date: 20130110
Implantable Lead Location: 753859
Implantable Lead Location: 753860
Implantable Lead Model: 5076
Implantable Lead Model: 6935
Implantable Pulse Generator Implant Date: 20181010
Lead Channel Impedance Value: 304 Ohm
Lead Channel Impedance Value: 361 Ohm
Lead Channel Impedance Value: 418 Ohm
Lead Channel Pacing Threshold Amplitude: 0.875 V
Lead Channel Pacing Threshold Pulse Width: 0.4 ms
Lead Channel Sensing Intrinsic Amplitude: 1.125 mV
Lead Channel Sensing Intrinsic Amplitude: 1.125 mV
Lead Channel Sensing Intrinsic Amplitude: 4.875 mV
Lead Channel Sensing Intrinsic Amplitude: 4.875 mV
Lead Channel Setting Pacing Amplitude: 2.5 V
Lead Channel Setting Pacing Amplitude: 3.5 V
Lead Channel Setting Pacing Pulse Width: 0.4 ms
Lead Channel Setting Sensing Sensitivity: 0.3 mV

## 2022-03-30 ENCOUNTER — Telehealth: Payer: Self-pay

## 2022-03-30 ENCOUNTER — Encounter: Payer: Self-pay | Admitting: Internal Medicine

## 2022-03-30 ENCOUNTER — Ambulatory Visit (INDEPENDENT_AMBULATORY_CARE_PROVIDER_SITE_OTHER): Payer: Medicare Other

## 2022-03-30 DIAGNOSIS — Z95810 Presence of automatic (implantable) cardiac defibrillator: Secondary | ICD-10-CM

## 2022-03-30 DIAGNOSIS — I5022 Chronic systolic (congestive) heart failure: Secondary | ICD-10-CM

## 2022-03-30 NOTE — Telephone Encounter (Signed)
Dr. Lovena Le aware.  No action needed.

## 2022-03-30 NOTE — Telephone Encounter (Signed)
ICM Carelink remote transmission reviewed.  Report showing 3 ATP for VT episodes ranging from 8/30-9/3.  Routed to device clinic triage for follow up as needed.

## 2022-04-02 NOTE — Progress Notes (Signed)
EPIC Encounter for ICM Monitoring  Patient Name: Austin [name] is a 81 y.o. male Date: 04/02/2022 Primary Care Physican: Lajean Manes, MD Primary Cardiologist: Smith/McLean Electrophysiologist: Lovena Le 09/02/2021 Weight: 194.7 lbs 12/23/2021 Weight: 194.7 lbs 02/23/2022 Weight: 195 lbs   Since 24-Mar-2022 AT/AF 4 Time in AT/AF <0.1 hr/day (0.1%) Longest AT/AF 3 minutes Pace terminated episodes 3 which Dr Lovena Le aware and no action needed per 9/5 phone note                 Spoke with patient and heart failure questions reviewed.  Pt asymptomatic for fluid accumulation.  Reports feeling well at this time and voices no complaints.    Optivol Thoracic impedance suggesting normal fluid levels.  ATP episodes addressed by device clinic nurse.   Prescribed:  Furosemide 20 mg Take 2 tablets (40 mg total) by mouth every morning AND 1 tablet (20 mg total) every evening.   He self adjusts Furosemide when needed.    Labs: 02/19/2022 Creatinine 1.49, BUN 35, Potassium 4.2, Sodium 139, GFR 12/29/2021 Creatinine 1.30, BUN 30, Potassium 4.1, Sodium 139, GFR 56 11/17/2021 Creatinine 1.58, BUN 35, Potassium 4.4, Sodium 134, GFR 44  11/06/2021 Creatinine 1.70, BUN 35, Potassium 4.2, Sodium 135, GFR 41  10/27/2021 Creatinine 1.54, BUN 30, Potassium 4.7, Sodium 141, GFR 46  08/26/2021 Creatinine 1.54, BUN 24, Potassium 4.1, Sodium 137, GFR 46 07/31/2021 Creatinine 1.15, BUN 15, Potassium 4.3, Sodium 139, GFR 65 07/30/2021 Creatinine 1.28, BUN 21, Potassium 3.7, Sodium 141, GFR 57 A complete set of results can be found in Results Review.   Recommendations: No changes and encouraged to call if experiencing any fluid symptoms.    Follow-up plan: ICM clinic phone appointment on 05/10/2022.   91 day device clinic remote transmission 06/23/2022.      EP/Cardiology Office Visits:  05/24/2022 with Dr Aundra Dubin.   06/10/2022 with Dr Lovena Le.  Recall 10/10/2022 with Dr Tamala Julian.     Copy of ICM check sent to Dr.  Lovena Le.     3 month ICM trend: 03/30/2022.    12-14 Month ICM trend:     [name], RN 04/02/2022 10:16 AM

## 2022-04-05 ENCOUNTER — Encounter (HOSPITAL_COMMUNITY): Payer: Self-pay | Admitting: Cardiology

## 2022-04-06 DIAGNOSIS — H524 Presbyopia: Secondary | ICD-10-CM | POA: Diagnosis not present

## 2022-04-06 DIAGNOSIS — H2513 Age-related nuclear cataract, bilateral: Secondary | ICD-10-CM | POA: Diagnosis not present

## 2022-04-07 ENCOUNTER — Telehealth: Payer: Self-pay

## 2022-04-07 NOTE — Patient Outreach (Signed)
  Care Coordination   04/07/2022 Name: ZEDDIE NJIE MRN: [id number] DOB: [birth year]   Care Coordination Outreach Attempts:  An unsuccessful telephone outreach was attempted today to offer the patient information about available care coordination services as a benefit of their health plan.   Follow Up Plan:  Additional outreach attempts will be made to offer the patient care coordination information and services.   Encounter Outcome:  No Answer  Care Coordination Interventions Activated:  No   Care Coordination Interventions:  No, not indicated    [place] [facility] [phone]

## 2022-04-08 ENCOUNTER — Ambulatory Visit (INDEPENDENT_AMBULATORY_CARE_PROVIDER_SITE_OTHER): Payer: Medicare Other

## 2022-04-08 DIAGNOSIS — Z5181 Encounter for therapeutic drug level monitoring: Secondary | ICD-10-CM

## 2022-04-08 DIAGNOSIS — I4891 Unspecified atrial fibrillation: Secondary | ICD-10-CM | POA: Diagnosis not present

## 2022-04-08 LAB — POCT INR: INR: 3.4 — AB (ref 2.0–3.0)

## 2022-04-08 NOTE — Patient Instructions (Signed)
Description   Spoke with pt and instructed to continue taking warfarin 1/2 tablet daily except for 1 tablet on Mondays, Wednesdays, and Fridays. Recheck INR in 2 weeks. Coumadin Clinic [phone].  AMIO DOSE INCREASED TO '400MG'$  ON SAT & SUN ALL OTHER DAYS STAY AT '200MG'$ .   Pt's cell phone [phone] in case difficulty reaching pt on home phone.

## 2022-04-16 NOTE — Progress Notes (Signed)
Remote ICD transmission.   

## 2022-04-18 ENCOUNTER — Encounter (HOSPITAL_COMMUNITY): Payer: Self-pay | Admitting: Cardiology

## 2022-04-19 ENCOUNTER — Other Ambulatory Visit (HOSPITAL_COMMUNITY): Payer: Self-pay | Admitting: *Deleted

## 2022-04-19 ENCOUNTER — Telehealth (HOSPITAL_COMMUNITY): Payer: Self-pay | Admitting: Pharmacy Technician

## 2022-04-19 ENCOUNTER — Other Ambulatory Visit (HOSPITAL_COMMUNITY): Payer: Self-pay

## 2022-04-19 MED ORDER — VERQUVO 5 MG PO TABS: 7.5000 mg | ORAL_TABLET | Freq: Every day | ORAL | 3 refills | Status: DC

## 2022-04-19 NOTE — Telephone Encounter (Signed)
Patient Advocate Encounter   Received notification from [facility] that prior authorization for Verquvo '5mg'$  (7.'5mg'$  daily) is required.   PA submitted on CoverMyMeds Key [id number] Status is pending   Will continue to follow.

## 2022-04-19 NOTE — Telephone Encounter (Signed)
Would recommend changing prescription to Verquvo 7.5 mg (1.5 tablets) daily and submitting quantity limit exception. If this is denied, the only other options are to either decrease back to 5 mg daily or increase to 10 mg daily.

## 2022-04-19 NOTE — Telephone Encounter (Signed)
I can help get him a grant for the medication. I would suggest a new RX written differently but would need to loop Lauren in for dosing. If she agrees, would need to submit a quantity limit exception to insurance (similar to a pa). I can respond to the patient once she recommends a dose or next steps.

## 2022-04-20 ENCOUNTER — Telehealth (HOSPITAL_COMMUNITY): Payer: Self-pay | Admitting: Pharmacy Technician

## 2022-04-20 ENCOUNTER — Encounter (HOSPITAL_COMMUNITY): Payer: Self-pay | Admitting: Pharmacy Technician

## 2022-04-20 ENCOUNTER — Other Ambulatory Visit (HOSPITAL_COMMUNITY): Payer: Self-pay

## 2022-04-20 NOTE — Telephone Encounter (Signed)
Advanced Heart Failure Patient Advocate Encounter  The patient was approved for a [employer] grant that will help cover the cost of Verquvo. Total amount awarded, $10,000. Eligibility, 03/21/22 - 03/21/23.  ID [id number]  BIN [id number]  PCN [id number]  Group [id number]  Sent patient approval information via mychart.  Charlann Boxer, CPhT

## 2022-04-20 NOTE — Telephone Encounter (Signed)
Advanced Heart Failure Patient Advocate Encounter  Prior Authorization for Verquvo '5mg'$  (7.'5mg'$  daily) has been approved.    PA#  [id number] Effective dates: 04/19/22 through 07/25/22  Charlann Boxer, CPhT

## 2022-04-22 ENCOUNTER — Ambulatory Visit (INDEPENDENT_AMBULATORY_CARE_PROVIDER_SITE_OTHER): Payer: Medicare Other

## 2022-04-22 DIAGNOSIS — Z5181 Encounter for therapeutic drug level monitoring: Secondary | ICD-10-CM | POA: Diagnosis not present

## 2022-04-22 DIAGNOSIS — I4891 Unspecified atrial fibrillation: Secondary | ICD-10-CM | POA: Diagnosis not present

## 2022-04-22 DIAGNOSIS — Z952 Presence of prosthetic heart valve: Secondary | ICD-10-CM | POA: Diagnosis not present

## 2022-04-22 LAB — POCT INR: INR: 3.7 — AB (ref 2.0–3.0)

## 2022-04-22 NOTE — Patient Instructions (Signed)
Description   Spoke with pt and instructed to eat greens today and continue taking warfarin 1/2 tablet daily except for 1 tablet on Mondays, Wednesdays, and Fridays. Recheck INR in 2 weeks. Coumadin Clinic [phone].  AMIO DOSE INCREASED TO '400MG'$  ON SAT & SUN ALL OTHER DAYS STAY AT '200MG'$ .   Pt's cell phone [phone] in case difficulty reaching pt on home phone.

## 2022-05-02 ENCOUNTER — Other Ambulatory Visit: Payer: Self-pay | Admitting: Internal Medicine

## 2022-05-06 ENCOUNTER — Ambulatory Visit (INDEPENDENT_AMBULATORY_CARE_PROVIDER_SITE_OTHER): Payer: Medicare Other

## 2022-05-06 DIAGNOSIS — Z5181 Encounter for therapeutic drug level monitoring: Secondary | ICD-10-CM

## 2022-05-06 DIAGNOSIS — I4891 Unspecified atrial fibrillation: Secondary | ICD-10-CM

## 2022-05-06 LAB — POCT INR: INR: 3.4 — AB (ref 2.0–3.0)

## 2022-05-06 NOTE — Patient Instructions (Signed)
Description   Spoke with pt and instructed to continue taking warfarin 1/2 tablet daily except for 1 tablet on Mondays, Wednesdays, and Fridays.  Recheck INR in 2 weeks. Coumadin Clinic [phone].  AMIO DOSE INCREASED TO 400MG ON SAT & SUN ALL OTHER DAYS STAY AT 200MG.   Pt's cell phone [phone] in case difficulty reaching pt on home phone.      

## 2022-05-10 ENCOUNTER — Telehealth: Payer: Self-pay

## 2022-05-10 ENCOUNTER — Ambulatory Visit (INDEPENDENT_AMBULATORY_CARE_PROVIDER_SITE_OTHER): Payer: Medicare Other

## 2022-05-10 DIAGNOSIS — Z95810 Presence of automatic (implantable) cardiac defibrillator: Secondary | ICD-10-CM

## 2022-05-10 DIAGNOSIS — I5022 Chronic systolic (congestive) heart failure: Secondary | ICD-10-CM | POA: Diagnosis not present

## 2022-05-10 NOTE — Telephone Encounter (Signed)
ICM review of 05/10/2022 Carelink remote transmission.  Transmission showing 12 ATP episodes from 9/15-10/15, 32 episodes of AT/AF and 32 episodes of VT-NS.  Routed to device clinic triage for follow up on ATP.

## 2022-05-10 NOTE — Telephone Encounter (Signed)
Patient returned call.   Patient notes his device was reprogrammed by Dr. Caryl Comes Dr. Lovena Le along with Dr. Aundra Dubin 02/19/22 OV.  Patient reports of list of intermittent symptoms, chest pain, shortness of breath, fatigue, lightheadedness (usually in am, BP running 108/65 each morning). States he has felt better over the last month than previously. Denies any symptoms today. Compliant with meds and doses on file.   Patient has upcoming apt with Dr. Lovena Le 06/10/22. Advised I will forward to Dr . Lovena Le and we will call if any changes are advised.   [place] DMV driving restrictions x6 months/shock plan reviewed with patient with verbal understanding.

## 2022-05-10 NOTE — Telephone Encounter (Signed)
Attempted to contact patient. No answer,LMTCB 

## 2022-05-10 NOTE — Telephone Encounter (Signed)
Pt called and left message;  Called patient to follow up on intermittent symptoms listed in note below;  Wanted to find out if Pt was interested in earlier appointment?   Follow up still required.

## 2022-05-11 ENCOUNTER — Telehealth: Payer: Self-pay | Admitting: Internal Medicine

## 2022-05-11 NOTE — Telephone Encounter (Signed)
Spoke with patient who states he has an appointment with Dr. Aundra Dubin on 05/24/22. He states he occasionally has some SOB and CP but not all of the time. Advised patient if SOB, CP worsens or he develops worsening symptoms to call EMS or go the ED for evaluation.  Patient verbalized understanding.

## 2022-05-11 NOTE — Telephone Encounter (Signed)
Pt returning a call from yesterday

## 2022-05-11 NOTE — Telephone Encounter (Signed)
Pt called back unavailable, left a message for Pt to call me back x 2.

## 2022-05-11 NOTE — Telephone Encounter (Signed)
Patient is returning RN's call. Please advise. 

## 2022-05-12 ENCOUNTER — Telehealth: Payer: Self-pay

## 2022-05-12 NOTE — Patient Outreach (Signed)
  Care Coordination   Initial Visit Note   05/12/2022 Name: Austin [name] MRN: [id number] DOB: Mar 26, 1941  Austin [name] is a 81 y.o. year old male who sees Lajean Manes, MD for primary care. I spoke with  Austin [name] by phone today.  What matters to the patients health and wellness today?  No Concerns Expressed/Requested Call Back    Goals Addressed   None     SDOH assessments and interventions completed:  No     Care Coordination Interventions Activated:  No  Care Coordination Interventions:  No, not indicated   Follow up plan:  Requested call back after establishing care with new primary care provider. Reports pending appointment on 05/24/22. Would like call back in November.    Encounter Outcome:  Pt. Request to Call Back   [facility] [phone]

## 2022-05-14 NOTE — Progress Notes (Signed)
EPIC Encounter for ICM Monitoring  Patient Name: Austin [name] is a 81 y.o. male Date: 05/14/2022 Primary Care Physican: Lajean Manes, MD Primary Cardiologist: [place] Electrophysiologist: Lovena Le 09/02/2021 Weight: 194.7 lbs 12/23/2021 Weight: 194.7 lbs 02/23/2022 Weight: 195 lbs   Clinical Status Since 30-Mar-2022 Treated VT 12 AT/AF(Monitor) Monitored VT-NS (>4 beats, >109 bpm) 32 SVT: VT/VF Rx Withheld 1 AT/AF 32 Time in AT/AF <0.1 hr/day (0.2%) Longest AT/AF 6 minutes                Transmission reviewed.     Optivol Thoracic impedance suggesting normal fluid levels.  ATP episodes addressed by device clinic nurse.   Prescribed:  Furosemide 20 mg Take 2 tablets (40 mg total) by mouth every morning AND 1 tablet (20 mg total) every evening.   He self adjusts Furosemide when needed.    Labs: 02/19/2022 Creatinine 1.49, BUN 35, Potassium 4.2, Sodium 139, GFR 12/29/2021 Creatinine 1.30, BUN 30, Potassium 4.1, Sodium 139, GFR 56 11/17/2021 Creatinine 1.58, BUN 35, Potassium 4.4, Sodium 134, GFR 44  11/06/2021 Creatinine 1.70, BUN 35, Potassium 4.2, Sodium 135, GFR 41  10/27/2021 Creatinine 1.54, BUN 30, Potassium 4.7, Sodium 141, GFR 46  08/26/2021 Creatinine 1.54, BUN 24, Potassium 4.1, Sodium 137, GFR 46 07/31/2021 Creatinine 1.15, BUN 15, Potassium 4.3, Sodium 139, GFR 65 07/30/2021 Creatinine 1.28, BUN 21, Potassium 3.7, Sodium 141, GFR 57 A complete set of results can be found in Results Review.   Recommendations: No changes.   Follow-up plan: ICM clinic phone appointment on 06/14/2022.   91 day device clinic remote transmission 06/23/2022.      EP/Cardiology Office Visits:  05/24/2022 with Dr Aundra Dubin.   06/10/2022 with Dr Lovena Le.  Recall 10/10/2022 with Dr Tamala Julian.     Copy of ICM check sent to Dr. Lovena Le.      3 month ICM trend: 05/10/2022.    12-14 Month ICM trend:     [name], RN 05/14/2022 4:25 PM

## 2022-05-17 ENCOUNTER — Encounter: Payer: Self-pay | Admitting: Internal Medicine

## 2022-05-17 ENCOUNTER — Ambulatory Visit (INDEPENDENT_AMBULATORY_CARE_PROVIDER_SITE_OTHER): Payer: Medicare Other | Admitting: Internal Medicine

## 2022-05-17 VITALS — BP 108/82 | HR 90 | Temp 97.5°F | Ht 73.0 in | Wt 202.0 lb

## 2022-05-17 DIAGNOSIS — K5904 Chronic idiopathic constipation: Secondary | ICD-10-CM | POA: Diagnosis not present

## 2022-05-17 DIAGNOSIS — Z23 Encounter for immunization: Secondary | ICD-10-CM

## 2022-05-17 DIAGNOSIS — D649 Anemia, unspecified: Secondary | ICD-10-CM | POA: Insufficient documentation

## 2022-05-17 DIAGNOSIS — D538 Other specified nutritional anemias: Secondary | ICD-10-CM

## 2022-05-17 DIAGNOSIS — I4891 Unspecified atrial fibrillation: Secondary | ICD-10-CM

## 2022-05-17 DIAGNOSIS — E032 Hypothyroidism due to medicaments and other exogenous substances: Secondary | ICD-10-CM

## 2022-05-17 DIAGNOSIS — D539 Nutritional anemia, unspecified: Secondary | ICD-10-CM | POA: Insufficient documentation

## 2022-05-17 DIAGNOSIS — N184 Chronic kidney disease, stage 4 (severe): Secondary | ICD-10-CM | POA: Insufficient documentation

## 2022-05-17 DIAGNOSIS — D638 Anemia in other chronic diseases classified elsewhere: Secondary | ICD-10-CM | POA: Insufficient documentation

## 2022-05-17 DIAGNOSIS — N1832 Chronic kidney disease, stage 3b: Secondary | ICD-10-CM | POA: Diagnosis not present

## 2022-05-17 LAB — MAGNESIUM: Magnesium: 2.2 mg/dL (ref 1.5–2.5)

## 2022-05-17 LAB — BASIC METABOLIC PANEL
BUN: 37 mg/dL — ABNORMAL HIGH (ref 6–23)
CO2: 28 mEq/L (ref 19–32)
Calcium: 8.9 mg/dL (ref 8.4–10.5)
Chloride: 100 mEq/L (ref 96–112)
Creatinine, Ser: 1.72 mg/dL — ABNORMAL HIGH (ref 0.40–1.50)
GFR: 37.33 mL/min — ABNORMAL LOW (ref >60.00)
Glucose, Bld: 73 mg/dL (ref 70–99)
Potassium: 4 mEq/L (ref 3.5–5.1)
Sodium: 135 mEq/L (ref 135–145)

## 2022-05-17 LAB — CBC WITH DIFFERENTIAL/PLATELET
Basophils Absolute: 0.1 10*3/uL (ref 0.0–0.1)
Basophils Relative: 0.8 % (ref 0.0–3.0)
Eosinophils Absolute: 0.1 10*3/uL (ref 0.0–0.7)
Eosinophils Relative: 1.5 % (ref 0.0–5.0)
HCT: 38.4 % — ABNORMAL LOW (ref 39.0–52.0)
Hemoglobin: 12.8 g/dL — ABNORMAL LOW (ref 13.0–17.0)
Lymphocytes Relative: 7.7 % — ABNORMAL LOW (ref 12.0–46.0)
Lymphs Abs: 0.6 10*3/uL — ABNORMAL LOW (ref 0.7–4.0)
MCHC: 33.3 g/dL (ref 30.0–36.0)
MCV: 91.8 fl (ref 78.0–100.0)
Monocytes Absolute: 0.9 10*3/uL (ref 0.1–1.0)
Monocytes Relative: 11.8 % (ref 3.0–12.0)
Neutro Abs: 5.6 10*3/uL (ref 1.4–7.7)
Neutrophils Relative %: 78.2 % — ABNORMAL HIGH (ref 43.0–77.0)
Platelets: 128 10*3/uL — ABNORMAL LOW (ref 150.0–400.0)
RBC: 4.18 Mil/uL — ABNORMAL LOW (ref 4.22–5.81)
RDW: 15.8 % — ABNORMAL HIGH (ref 11.5–15.5)
WBC: 7.2 10*3/uL (ref 4.0–10.5)

## 2022-05-17 LAB — TSH: TSH: 5.69 u[IU]/mL — ABNORMAL HIGH (ref 0.35–5.50)

## 2022-05-17 NOTE — Progress Notes (Signed)
Subjective:  Patient ID: Austin [name], male    DOB: May 01, 1941  Age: 81 y.o. MRN: [id number]  CC: New Patient (Initial Visit), Anemia, and Hypothyroidism   HPI ALIN CHAVIRA presents for establishing.  He complains of a one year hx of feeling bloated, constipated, and gassy. He gets sx relief with a probiotic and dulcolax.  History Chayce has a past medical history of AICD (automatic cardioverter/defibrillator) present, Atrial fibrillation ([place]), 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, Conductive hearing loss, Intraspinal abscess, Mitral valve insufficiency and aortic valve insufficiency, Nonischemic cardiomyopathy ([place]), Psychosexual dysfunction with inhibited sexual excitement, S/P mitral valve replacement, Syncope and collapse, Unspecified sleep apnea, Ventricular tachycardia ([place]), and VT (ventricular tachycardia) ([place]).   He has a past surgical history that includes Mitral valve replacement; Valve replacement (2000); Evacution of epidural lumbar epidural abscess (1999); Thyroidectomy; Tonsillectomy; dental implants; Cardiac catheterization (04/24/2002); Insert / replace / remove pacemaker (11/2008); Cardioversion (03/09/2012); Cardioversion (N/A, 11/22/2013); permanent pacemaker insertion (N/A, 08/05/2011); Cardiac catheterization (N/A, 09/15/2015); Cardiac valve replacement; BIV ICD GENERATOR CHANGEOUT (N/A, 05/04/2017); and RIGHT HEART CATH (N/A, 02/26/2021).   His family history includes Heart disease in his father and mother; Heart failure in his father and mother.He reports that he has never smoked. He has never used smokeless tobacco. He reports current alcohol use. He reports that he does not use drugs.  Outpatient Medications Prior to Visit  Medication Sig Dispense Refill   acetaminophen (TYLENOL) 500 MG tablet Take 1,000 mg by mouth every 6 (six) hours as needed (pain).     amiodarone (PACERONE) 200 MG tablet Take 1 tablet (200 mg total) by mouth daily. 90  tablet 1   amoxicillin (AMOXIL) 500 MG capsule TAKE 4 CAPSULES BY MOUTH 30 MINUTES PRIOR TO DENTAL WORK 4 capsule 2   levothyroxine (SYNTHROID) 75 MCG tablet Take 1 tablet (75 mcg total) by mouth daily before breakfast. 90 tablet 3   Multiple Vitamin (MULTIVITAMIN WITH MINERALS) TABS tablet Take 1 tablet by mouth in the morning.     nitroGLYCERIN (NITROSTAT) 0.4 MG SL tablet Place 1 tablet (0.4 mg total) under the tongue every 5 (five) minutes x 3 doses as needed for chest pain. 25 tablet 2   quiniDINE gluconate 324 MG CR tablet Take 1 tablet (324 mg total) by mouth 2 (two) times daily. 60 tablet 11   spironolactone (ALDACTONE) 25 MG tablet Take 0.5 tablets (12.5 mg total) by mouth daily. 90 tablet 3   valsartan (DIOVAN) 40 MG tablet Take 40 mg by mouth 2 (two) times daily.     warfarin (COUMADIN) 5 MG tablet TAKE AS DIRECTED BY THE COUMADIN CLINIC 80 tablet 1   furosemide (LASIX) 20 MG tablet TAKE 2 TABLETS (40 MG TOTAL) BY MOUTH EVERY MORNING AND 1 TABLET (20 MG TOTAL) EVERY EVENING. 270 tablet 2   metoprolol succinate (TOPROL-XL) 25 MG 24 hr tablet TAKE 1/2 TABLETS BY MOUTH DAILY. TAKE WITH OR IMMEDIATELY FOLLOWING A MEAL. 45 tablet 3   Vericiguat (VERQUVO) 5 MG TABS Take 1.5 tablets (7.5 mg total) by mouth daily. 135 tablet 3   No facility-administered medications prior to visit.    ROS Review of Systems  Constitutional: Negative.  Negative for chills, diaphoresis, fatigue and fever.  HENT: Negative.  Negative for trouble swallowing.   Eyes: Negative.   Respiratory:  Positive for chest tightness and shortness of breath. Negative for wheezing.   Cardiovascular: Negative.  Negative for chest pain, palpitations and leg swelling.  Gastrointestinal:  Negative for  abdominal pain, constipation, diarrhea, nausea and vomiting.  Endocrine: Negative.   Genitourinary: Negative.  Negative for difficulty urinating.  Musculoskeletal: Negative.   Skin: Negative.   Neurological:  Positive for  dizziness and light-headedness.  Hematological:  Negative for adenopathy. Does not bruise/bleed easily.  Psychiatric/Behavioral: Negative.      Objective:  BP 108/82   Pulse 90   Temp (!) 97.5 F (36.4 C)   Ht '6\' 1"'$  (1.854 m)   Wt 202 lb (91.6 kg)   SpO2 94%   BMI 26.65 kg/m   Physical Exam Vitals reviewed.  HENT:     Nose: Nose normal.     Mouth/Throat:     Mouth: Mucous membranes are moist.  Eyes:     General: No scleral icterus.    Conjunctiva/sclera: Conjunctivae normal.  Cardiovascular:     Rate and Rhythm: Normal rate. Rhythm irregularly irregular.     Pulses:          Dorsalis pedis pulses are 0 on the right side and 0 on the left side.       Posterior tibial pulses are 0 on the right side and 0 on the left side.  Pulmonary:     Effort: Pulmonary effort is normal.     Breath sounds: Normal breath sounds. No stridor. No wheezing, rhonchi or rales.  Abdominal:     General: Abdomen is flat.     Palpations: There is no mass.     Tenderness: There is no abdominal tenderness. There is no guarding.     Hernia: No hernia is present.  Musculoskeletal:     Cervical back: Neck supple.  Feet:     Right foot:     Skin integrity: Skin integrity normal.     Toenail Condition: Right toenails are normal.     Left foot:     Skin integrity: Skin integrity normal.     Toenail Condition: Left toenails are normal.  Lymphadenopathy:     Cervical: No cervical adenopathy.  Skin:    General: Skin is warm and dry.  Neurological:     General: No focal deficit present.     Mental Status: He is alert.     Lab Results  Component Value Date   WBC 7.2 05/17/2022   HGB 12.8 (L) 05/17/2022   HCT 38.4 (L) 05/17/2022   PLT 128.0 (L) 05/17/2022   GLUCOSE 98 05/24/2022   CHOL 158 08/23/2015   TRIG 130 08/23/2015   HDL 50 08/23/2015   LDLCALC 82 08/23/2015   ALT 24 05/24/2022   AST 30 05/24/2022   NA 136 05/24/2022   K 4.3 05/24/2022   CL 101 05/24/2022   CREATININE 2.01 (H)  05/24/2022   BUN 42 (H) 05/24/2022   CO2 26 05/24/2022   TSH 5.69 (H) 05/17/2022   INR 3.4 (A) 05/20/2022     Assessment & Plan:   Salah was seen today for new patient (initial visit), anemia and hypothyroidism.  Diagnoses and all orders for this visit:  Atrial fibrillation, unspecified type ([place])- He has good rate control. -     CBC with Differential/Platelet; Future -     TSH; Future -     TSH -     CBC with Differential/Platelet  Hypothyroidism due to medication- He is euthyroid. -     CBC with Differential/Platelet; Future -     TSH; Future -     TSH -     CBC with Differential/Platelet  Chronic idiopathic constipation -  Labs are negative for secondary causes. -     Basic metabolic panel; Future -     CBC with Differential/Platelet; Future -     TSH; Future -     Magnesium; Future -     Magnesium -     TSH -     CBC with Differential/Platelet -     Basic metabolic panel  Need for immunization against influenza -     Flu Vaccine QUAD High Dose(Fluad)  Stage 3b chronic kidney disease ([place])- Will avoid nephrotoxic agents.  Deficiency anemia- Will treat the zinc deficiency. -     Reticulocytes; Future -     IBC + Ferritin; Future -     Vitamin B12; Future -     Vitamin B1; Future -     Zinc; Future -     Folate; Future  Anemia due to zinc deficiency -     zinc gluconate 50 MG tablet; Take 1 tablet (50 mg total) by mouth daily.   I am having Buryl L. Nease start on zinc gluconate. I am also having him maintain his multivitamin with minerals, acetaminophen, nitroGLYCERIN, amoxicillin, spironolactone, valsartan, quiniDINE gluconate, levothyroxine, amiodarone, and warfarin.  Meds ordered this encounter  Medications   zinc gluconate 50 MG tablet    Sig: Take 1 tablet (50 mg total) by mouth daily.    Dispense:  90 tablet    Refill:  1     Follow-up: Return in about 3 months (around 08/17/2022).  Scarlette Calico, MD

## 2022-05-17 NOTE — Patient Instructions (Signed)

## 2022-05-19 ENCOUNTER — Other Ambulatory Visit (INDEPENDENT_AMBULATORY_CARE_PROVIDER_SITE_OTHER): Payer: Medicare Other

## 2022-05-19 DIAGNOSIS — D539 Nutritional anemia, unspecified: Secondary | ICD-10-CM | POA: Diagnosis not present

## 2022-05-19 LAB — VITAMIN B12: Vitamin B-12: 520 pg/mL (ref 211–911)

## 2022-05-19 LAB — FOLATE: Folate: >23.8 ng/mL (ref >5.9)

## 2022-05-19 LAB — IBC + FERRITIN
Ferritin: 164.3 ng/mL (ref 22.0–322.0)
Iron: 87 ug/dL (ref 42–165)
Saturation Ratios: 28.4 % (ref 20.0–50.0)
TIBC: 306.6 ug/dL (ref 250.0–450.0)
Transferrin: 219 mg/dL (ref 212.0–360.0)

## 2022-05-20 ENCOUNTER — Ambulatory Visit (INDEPENDENT_AMBULATORY_CARE_PROVIDER_SITE_OTHER): Payer: Medicare Other

## 2022-05-20 DIAGNOSIS — I4891 Unspecified atrial fibrillation: Secondary | ICD-10-CM | POA: Diagnosis not present

## 2022-05-20 DIAGNOSIS — Z5181 Encounter for therapeutic drug level monitoring: Secondary | ICD-10-CM | POA: Diagnosis not present

## 2022-05-20 LAB — POCT INR: INR: 3.4 — AB (ref 2.0–3.0)

## 2022-05-20 NOTE — Patient Instructions (Signed)
Description   Spoke with pt and instructed to continue taking warfarin 1/2 tablet daily except for 1 tablet on Mondays, Wednesdays, and Fridays.  Recheck INR in 2 weeks. [facility] [phone].  AMIO DOSE INCREASED TO '400MG'$  ON SAT & SUN ALL OTHER DAYS STAY AT '200MG'$ .   Pt's cell phone [phone] in case difficulty reaching pt on home phone.

## 2022-05-24 ENCOUNTER — Other Ambulatory Visit (HOSPITAL_COMMUNITY): Payer: Self-pay

## 2022-05-24 ENCOUNTER — Ambulatory Visit (HOSPITAL_COMMUNITY)
Admission: RE | Admit: 2022-05-24 | Discharge: 2022-05-24 | Disposition: A | Discharge status: Home / Self Care | Payer: Medicare Other | Source: Ambulatory Visit | Attending: Cardiology | Admitting: Cardiology

## 2022-05-24 VITALS — BP 110/89 | HR 66 | Wt 203.0 lb

## 2022-05-24 DIAGNOSIS — Z95810 Presence of automatic (implantable) cardiac defibrillator: Secondary | ICD-10-CM | POA: Diagnosis not present

## 2022-05-24 DIAGNOSIS — I4892 Unspecified atrial flutter: Secondary | ICD-10-CM | POA: Diagnosis not present

## 2022-05-24 DIAGNOSIS — E039 Hypothyroidism, unspecified: Secondary | ICD-10-CM | POA: Insufficient documentation

## 2022-05-24 DIAGNOSIS — I442 Atrioventricular block, complete: Secondary | ICD-10-CM | POA: Insufficient documentation

## 2022-05-24 DIAGNOSIS — Z7901 Long term (current) use of anticoagulants: Secondary | ICD-10-CM | POA: Diagnosis not present

## 2022-05-24 DIAGNOSIS — I480 Paroxysmal atrial fibrillation: Secondary | ICD-10-CM | POA: Diagnosis not present

## 2022-05-24 DIAGNOSIS — Z952 Presence of prosthetic heart valve: Secondary | ICD-10-CM | POA: Diagnosis not present

## 2022-05-24 DIAGNOSIS — I472 Ventricular tachycardia, unspecified: Secondary | ICD-10-CM

## 2022-05-24 DIAGNOSIS — R0602 Shortness of breath: Secondary | ICD-10-CM | POA: Insufficient documentation

## 2022-05-24 DIAGNOSIS — I5022 Chronic systolic (congestive) heart failure: Secondary | ICD-10-CM

## 2022-05-24 DIAGNOSIS — Z79890 Hormone replacement therapy (postmenopausal): Secondary | ICD-10-CM | POA: Diagnosis not present

## 2022-05-24 DIAGNOSIS — Z79899 Other long term (current) drug therapy: Secondary | ICD-10-CM | POA: Diagnosis not present

## 2022-05-24 LAB — COMPREHENSIVE METABOLIC PANEL
ALT: 24 U/L (ref 0–44)
AST: 30 U/L (ref 15–41)
Albumin: 3.8 g/dL (ref 3.5–5.0)
Alkaline Phosphatase: 65 U/L (ref 38–126)
Anion gap: 9 (ref 5–15)
BUN: 42 mg/dL — ABNORMAL HIGH (ref 8–23)
CO2: 26 mmol/L (ref 22–32)
Calcium: 9.1 mg/dL (ref 8.9–10.3)
Chloride: 101 mmol/L (ref 98–111)
Creatinine, Ser: 2.01 mg/dL — ABNORMAL HIGH (ref 0.61–1.24)
GFR, Estimated: 33 mL/min — ABNORMAL LOW (ref >60)
Glucose, Bld: 98 mg/dL (ref 70–99)
Potassium: 4.3 mmol/L (ref 3.5–5.1)
Sodium: 136 mmol/L (ref 135–145)
Total Bilirubin: 0.9 mg/dL (ref 0.3–1.2)
Total Protein: 7 g/dL (ref 6.5–8.1)

## 2022-05-24 LAB — BRAIN NATRIURETIC PEPTIDE: B Natriuretic Peptide: 544.5 pg/mL — ABNORMAL HIGH (ref 0.0–100.0)

## 2022-05-24 MED ORDER — FUROSEMIDE 20 MG PO TABS: 40.0000 mg | ORAL_TABLET | Freq: Two times a day (BID) | ORAL | 4 refills | Status: DC

## 2022-05-24 MED ORDER — VERQUVO 10 MG PO TABS: 10.0000 mg | ORAL_TABLET | Freq: Every day | ORAL | 11 refills | Status: DC

## 2022-05-24 MED ORDER — METOPROLOL SUCCINATE ER 25 MG PO TB24: 25.0000 mg | ORAL_TABLET | Freq: Every day | ORAL | 3 refills | Status: DC

## 2022-05-24 NOTE — Patient Instructions (Addendum)
INCREASE Verquvo to 10 mg daily.  INCREASE Toprol XL  25 mg daily.  INCREASE Lasix to 40 mg daily.  Labs done today, your results will be available in MyChart, we will contact you for abnormal readings.  Repeat blood work in 10 days.  Your physician recommends that you schedule a follow-up appointment in: 3 months   If you have any questions or concerns before your next appointment please send [place] a message through [place] or call our office at [phone].    TO LEAVE A MESSAGE FOR THE NURSE SELECT OPTION 2, PLEASE LEAVE A MESSAGE INCLUDING: YOUR NAME DATE OF BIRTH CALL BACK NUMBER REASON FOR CALL**this is important as we prioritize the call backs  YOU WILL RECEIVE A CALL BACK THE SAME DAY AS LONG AS YOU CALL BEFORE 4:00 PM  At the [facility], you and your health needs are our priority. As part of our continuing mission to provide you with exceptional heart care, we have created designated Provider Care Teams. These Care Teams include your primary Cardiologist (physician) and Advanced Practice Providers (APPs- Physician Assistants and Nurse Practitioners) who all work together to provide you with the care you need, when you need it.   You may see any of the following providers on your designated Care Team at your next follow up: Dr Glori Bickers Dr Loralie Champagne Dr. Roxana Hires, NP Lyda Jester, [place] [facility] [place], [place] Forestine Na, NP Audry Riles, PharmD   Please be sure to bring in all your medications bottles to every appointment.

## 2022-05-24 NOTE — Progress Notes (Signed)
PCP: Janith Lima, MD Cardiology: Dr. Tamala Julian HF Cardiology: Dr. Aundra Dubin EP: Dr. Lovena Le  81 y.o. with history of mechanical mitral valve s/p MV endocarditis, VT, nonischemic cardiomyopathy, complete heart block was referred by Dr. Tamala Julian for evaluation of CHF.  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 He subsequently developed a presumed nonischemic cardiomyopathy.  This was complicated by VT, he had a VT ablation in 2017 and had a Medtronic CRT-D device implanted. This had to be removed due to infection in 11/12 and was reimplanted in 1/13. Last echo in 5/22 showed EF 30% with normal RV, mechanical MV stable with mean gradient 3 mmHg.  He has been in atrial fibrillation persistently since late 2021.  He is on amiodarone and has been cardioverted in the past. His LV lead is not functional.  He has complete heart block and is persistently RV pacing.   Had [place] 02/26/21 RA 4, PA 32/12, PCWP 11, CO 4.6, CI 2.2. Normal filling pressures and slightly low cardiac output.   Digoxin was stopped due to elevated level.   He saw Dr. Lovena Le in 9/22 and was in slow VT in the office.  He was paced out of VT and amiodarone was increased to 200 mg daily.   He saw Dr. Lovena Le, and placement of a left bundle or LV lead was decided to be too risky a procedure.  He was referred to Dr. Oneida Alar with the hope that he could have a VT ablation and get off amiodarone, which is likely playing a role in his symptoms.     S/p VT ablation with Dr. Oneida Alar 1/23.  Echo in 4/23 showed EF 20-25% with diffuse hypokinesis and septal-lateral dyssynchrony, mildly decreased RV systolic function, mechanical MV with normal function, dilated IVC.   He was seen in the office in 7/23, noted to be in atrial flutter in the atria and slow VT in the ventricles.  VT was pace terminated by Dr. Caryl Comes and lower threshold set for ATP.  Later in 7/23, he had another VT episode and had an ICD discharge.   Quinidine was then started by Dr. Lovena Le.   He returns for followup of CHF.  He has not been shocked but has had runs of VT terminated by ATP.  Occasional short AF/AFL runs noted as well.  He has rare lightheadedness with fast standing.  No chest pain.  He is short of breath after walking 200 feet.  He is short of breath with stairs.  Generally does ok walking around the house.  No orthopnea/PND. Weight up 3 lbs. He says that he is exercising more.  SBP running 95-110 at home.   Medtronic device interrogation: thoracic impedence stable, 19 episodes of VT with ATP since 8/23 but no shocks, occasional short AF/AFL.  95% RV pacing  ECG (personally reviewed): a-RV dual pacing  Labs (7/22): K 4.6, creatinine 1.3 Labs (8/22): K 4.4, creatinine 1.23 Labs (10/22): K 3.9, creatinine 1.36 Labs (12/22): K 4.2, creatinine 1.39 Labs (1/23): K 4.3, creatinine 1.15 Labs (2/23): TSH 10, K 4.1, creatinine 1.54 Labs (6/23): BNP 798, K 4.1, creatinine 1.3 Labs (7/23): BNP 1244, LFTs normal, K 4.2, creatinine 1.49 Labs (10/23): K 4, creatinine 1.72, TSH 5.7, transferrin saturation 28%, hgb 12.8, plts 128  PMH: 1. 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 2. H/o VT: VT ablation in 2017.  - Redo VT ablation in 1/23 with Dr. Oneida Alar - Claudia Desanctis termination of VT in office in  7/23.  - ICD shock 7/23, started quinidine.  3. Atrial fibrillation/atypical flutter: Persistent since 2021.  Failed amiodarone.  4. PAD: Tibial occlusive disease.  5. 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  6. Hypothyroidism.  7. Chronic systolic CHF: Presumed nonischemic cardiomyopathy.  Medtronic CRT-D device, LV lead is nonfunctional.  - Echo (2012): EF 45% - Echo (1/17): EF 30-35% - Echo (6/21): EF 15-20% - Echo (11/21): EF 30-35%  - Echo (5/22): EF 30%, normal RV, mechanical mitral valve mean gradient 3 mmHg.  - RHC (8/22): RA mean 4, RV 33/4,PA 32/12 mean 21, PCWP mean 11, Oxygen saturations:PA 71%, AO  98%, Cardiac Output (Fick) 4.66 Cardiac Index (Fick) 2.18 - Echo (4/23): EF 20-25% with diffuse hypokinesis and septal-lateral dyssynchrony, mildly decreased RV systolic function, mechanical MV with normal function, dilated IVC.   Social History   Socioeconomic History   Marital status: Married    Spouse name: Not on file   Number of children: 1   Years of education: 55   Highest education level: Master's degree (e.g., MA, MS, MEng, MEd, MSW, MBA)  Occupational History   Occupation: retired from [employer], manufacturing systems  Tobacco Use   Smoking status: Never   Smokeless tobacco: Never  Vaping Use   Vaping Use: Never used  Substance and Sexual Activity   Alcohol use: Yes    Alcohol/week: 0.0 standard drinks of alcohol    Comment: occasionally   Drug use: No   Sexual activity: Not on file  Other Topics Concern   Not on file  Social History Narrative   Lives with wife in a one story home.  Has one daughter.  Retired.  Education: Masters.   Social Determinants of Health   Financial Resource Strain: Not on file  Food Insecurity: Not on file  Transportation Needs: Not on file  Physical Activity: Not on file  Stress: Not on file  Social Connections: Not on file  Intimate Partner Violence: Not on file   Family History  Problem Relation Age of Onset   Heart disease Mother    Heart failure Mother    Heart disease Father    Heart failure Father    ROS: All systems reviewed and negative except as per HPI.   Current Outpatient Medications  Medication Sig Dispense Refill   acetaminophen (TYLENOL) 500 MG tablet Take 1,000 mg by mouth every 6 (six) hours as needed (pain).     amiodarone (PACERONE) 200 MG tablet Take 1 tablet (200 mg total) by mouth daily. 90 tablet 1   amoxicillin (AMOXIL) 500 MG capsule TAKE 4 CAPSULES BY MOUTH 30 MINUTES PRIOR TO DENTAL WORK 4 capsule 2   levothyroxine (SYNTHROID) 75 MCG tablet Take 1 tablet (75 mcg total) by mouth daily before breakfast. 90 tablet 3    Multiple Vitamin (MULTIVITAMIN WITH MINERALS) TABS tablet Take 1 tablet by mouth in the morning.     nitroGLYCERIN (NITROSTAT) 0.4 MG SL tablet Place 1 tablet (0.4 mg total) under the tongue every 5 (five) minutes x 3 doses as needed for chest pain. 25 tablet 2   quiniDINE gluconate 324 MG CR tablet Take 1 tablet (324 mg total) by mouth 2 (two) times daily. 60 tablet 11   spironolactone (ALDACTONE) 25 MG tablet Take 0.5 tablets (12.5 mg total) by mouth daily. 90 tablet 3   valsartan (DIOVAN) 40 MG tablet Take 40 mg by mouth 2 (two) times daily.     Vericiguat (VERQUVO) 10 MG TABS Take 10 mg by mouth daily. [place]  tablet 11   warfarin (COUMADIN) 5 MG tablet TAKE AS DIRECTED BY THE COUMADIN CLINIC 80 tablet 1   furosemide (LASIX) 20 MG tablet Take 2 tablets (40 mg total) by mouth 2 (two) times daily. 300 tablet 4   metoprolol succinate (TOPROL-XL) 25 MG 24 hr tablet Take 1 tablet (25 mg total) by mouth daily. 90 tablet 3   No current facility-administered medications for this encounter.   BP 110/89   Pulse 66   Wt 92.1 kg (203 lb)   SpO2 96%   BMI 26.78 kg/m   Wt Readings from Last 3 Encounters:  05/24/22 92.1 kg (203 lb)  05/17/22 91.6 kg (202 lb)  03/02/22 90.7 kg (200 lb)   General: NAD Neck: JVP 8 cm, no thyromegaly or thyroid nodule.  Lungs: Clear to auscultation bilaterally with normal respiratory effort. CV: Nondisplaced PMI.  Heart regular S1/S2 with mechanical S1, no S3/S4, no murmur.  1+ edema to knees.  No carotid bruit.  Normal pedal pulses.  Abdomen: Soft, nontender, no hepatosplenomegaly, no distention.  Skin: Intact without lesions or rashes.  Neurologic: Alert and oriented x 3.  Psych: Normal affect. Extremities: No clubbing or cyanosis.  HEENT: Normal.   Assessment/Plan: 1. Chronic systolic CHF: Long-standing, suspected nonischemic cardiomyopathy.  Had [place] on 02/26/21 with normal filling pressures and mildly low cardiac output.  Echo in 4/23 showed  EF 20-25% with  diffuse hypokinesis and septal-lateral dyssynchrony, mildly decreased RV systolic function, mechanical MV with normal function, dilated IVC.  He has a Medtronic CRT-D device present, but the LV lead is not functional.  He has complete heart block, so has been RV paced chronically.   He is predominantly in NSR with short occasional runs of AF/AFL.  NYHA class III, stable.  On exam, he looks mildly volume overloaded though Optivol is stable.  - Continue Lasix 40 qam/20 qpm.  I will not increase this with creatinine up to 2 today.  - Continue spironolactone 12.5 daily, will not increase due to soft BP and higher creatinine.   - Off digoxin due to persistently elevated level.  - With orthostatic symptoms, he is now off Entresto and on valsartan 40 mg bid (continue).   - Can increase Toprol XL to 25 mg daily at bedtime.  - Increase Verquvo to 10 mg daily.  - The patient has CHB and has been chronically RV pacing as his LV lead is nonfunctional (when not in VT).  This is not ideal and creates dyssynchrony.  Remains symptomatic with limited ability to titrate meds. He has seen Dr. Lovena Le, it would be possible to place left bundle lead but procedure would be complicated.  After further discussion with Dr. Lovena Le, it was decided that risk would be too high and left bundle lead will not be attempted.   2. Atrial fibrillation/flutter: Paroxysmal.  A-paced today.  - He is on amiodarone 200 mg daily.  Check LFTs, he is on Levoxyl for hypothyroidism.  He will need a regular eye exam.  - Continue Warfarin.  3. Mechanical mitral valve: Valve looked normal on 4/23 echo.   - Continue warfarin, INR goal 3-3.5.  - He has not been on ASA.  4. VT: History of VT and VT ablation in 2017.  MDT ICD.  Redo VT ablation in 1/23 (Dr. Oneida Alar).  He is on amiodarone, this was increased to 200 mg daily recently with recurrent VT.  He has tolerated amiodarone poorly historically.  In 7/23, slow VT was pace-terminated in the  office,  then he had an ICD shock for VT later in 7/23.  He was started on quinidine after this.  Still having periodic VT treated with ATP, asymptomatic.  - Continue amiodarone 200 mg daily.  - Continue quinidine.  5. Complete heart block: Per notes, patient is pacer dependent due to CHB.  - See above, upgrade to CRT thought to be too risky.    Followup in 3 months with APP.   Loralie Champagne, MD 05/24/2022

## 2022-05-25 ENCOUNTER — Encounter (HOSPITAL_COMMUNITY): Payer: Medicare Other | Admitting: Cardiology

## 2022-05-25 ENCOUNTER — Telehealth (HOSPITAL_COMMUNITY): Payer: Self-pay

## 2022-05-25 DIAGNOSIS — D538 Other specified nutritional anemias: Secondary | ICD-10-CM | POA: Insufficient documentation

## 2022-05-25 LAB — VITAMIN B1: Vitamin B1 (Thiamine): 59 nmol/L — ABNORMAL HIGH (ref 8–30)

## 2022-05-25 LAB — RETICULOCYTES
ABS Retic: 55860 cells/uL (ref 25000–90000)
Retic Ct Pct: 1.4 %

## 2022-05-25 LAB — ZINC: Zinc: 55 ug/dL — ABNORMAL LOW (ref 60–130)

## 2022-05-25 MED ORDER — FUROSEMIDE 20 MG PO TABS: ORAL_TABLET | ORAL | 2 refills | Status: DC

## 2022-05-25 MED ORDER — ZINC GLUCONATE 50 MG PO TABS: 50.0000 mg | ORAL_TABLET | Freq: Every day | ORAL | 1 refills | Status: DC

## 2022-05-25 NOTE — Telephone Encounter (Addendum)
Pt aware, agreeable, and verbalized understanding    ----- Message from Larey Dresser, MD sent at 05/24/2022  4:15 PM EDT ----- Creatinine is higher.  I increased his lasix today but would have him not increase lasix.  Keep it at 40 qam/20 qpm.  Tell him to cut back on sodium intake.

## 2022-05-26 NOTE — Telephone Encounter (Signed)
Called Pt back, left message and had not heard back.  Pt saw Dr. Einar Crow on 05/24/22 and change in POC has already been addressed.  Pt due to see Dr. Lovena Le 06/10/2022.    Earlier appointment offered, but may not be needed, since he had an appointment 05/24/22.

## 2022-05-31 DIAGNOSIS — G4733 Obstructive sleep apnea (adult) (pediatric): Secondary | ICD-10-CM | POA: Diagnosis not present

## 2022-06-03 ENCOUNTER — Ambulatory Visit (INDEPENDENT_AMBULATORY_CARE_PROVIDER_SITE_OTHER): Payer: Medicare Other | Admitting: *Deleted

## 2022-06-03 ENCOUNTER — Ambulatory Visit (HOSPITAL_COMMUNITY)
Admission: RE | Admit: 2022-06-03 | Discharge: 2022-06-03 | Disposition: A | Discharge status: Home / Self Care | Payer: Medicare Other | Source: Ambulatory Visit | Attending: Cardiology | Admitting: Cardiology

## 2022-06-03 DIAGNOSIS — Z5181 Encounter for therapeutic drug level monitoring: Secondary | ICD-10-CM | POA: Diagnosis not present

## 2022-06-03 DIAGNOSIS — I5022 Chronic systolic (congestive) heart failure: Secondary | ICD-10-CM | POA: Insufficient documentation

## 2022-06-03 DIAGNOSIS — I4891 Unspecified atrial fibrillation: Secondary | ICD-10-CM

## 2022-06-03 LAB — BASIC METABOLIC PANEL
Anion gap: 9 (ref 5–15)
BUN: 38 mg/dL — ABNORMAL HIGH (ref 8–23)
CO2: 23 mmol/L (ref 22–32)
Calcium: 8.8 mg/dL — ABNORMAL LOW (ref 8.9–10.3)
Chloride: 104 mmol/L (ref 98–111)
Creatinine, Ser: 2.05 mg/dL — ABNORMAL HIGH (ref 0.61–1.24)
GFR, Estimated: 32 mL/min — ABNORMAL LOW (ref >60)
Glucose, Bld: 88 mg/dL (ref 70–99)
Potassium: 4.2 mmol/L (ref 3.5–5.1)
Sodium: 136 mmol/L (ref 135–145)

## 2022-06-03 LAB — POCT INR: INR: 3.7 — AB (ref 2.0–3.0)

## 2022-06-03 NOTE — Patient Instructions (Signed)
Description   Spoke with pt and instructed to take 1/2 tablet tomorrow then continue taking warfarin 1/2 tablet daily except for 1 tablet on Mondays, Wednesdays, and Fridays.  Recheck INR in 2 weeks. Coumadin Clinic [phone].  AMIO DOSE INCREASED TO '400MG'$  ON SAT & SUN ALL OTHER DAYS STAY AT '200MG'$ .   Pt's cell phone [phone] in case difficulty reaching pt on home phone.

## 2022-06-10 ENCOUNTER — Ambulatory Visit: Payer: Medicare Other | Attending: Internal Medicine | Admitting: Internal Medicine

## 2022-06-10 ENCOUNTER — Encounter: Payer: Self-pay | Admitting: Internal Medicine

## 2022-06-10 ENCOUNTER — Encounter: Payer: Self-pay | Admitting: Cardiology

## 2022-06-10 VITALS — BP 102/68 | HR 78 | Ht 73.5 in | Wt 206.6 lb

## 2022-06-10 DIAGNOSIS — I472 Ventricular tachycardia, unspecified: Secondary | ICD-10-CM

## 2022-06-10 DIAGNOSIS — I4891 Unspecified atrial fibrillation: Secondary | ICD-10-CM | POA: Diagnosis not present

## 2022-06-10 DIAGNOSIS — Z95810 Presence of automatic (implantable) cardiac defibrillator: Secondary | ICD-10-CM | POA: Diagnosis not present

## 2022-06-10 DIAGNOSIS — I428 Other cardiomyopathies: Secondary | ICD-10-CM | POA: Diagnosis not present

## 2022-06-10 NOTE — Patient Instructions (Addendum)
Medication Instructions:  Your physician recommends that you continue on your current medications as directed. Please refer to the Current Medication list given to you today.  *If you need a refill on your cardiac medications before your next appointment, please call your pharmacy*  Lab Work: None ordered.  If you have labs (blood work) drawn today and your tests are completely normal, you will receive your results only by: [place] (if you have MyChart) OR A paper copy in the mail If you have any lab test that is abnormal or we need to change your treatment, we will call you to review the results.  Testing/Procedures: None ordered.  Follow-Up: At [facility], you and your health needs are our priority.  As part of our continuing mission to provide you with exceptional heart care, we have created designated Provider Care Teams.  These Care Teams include your primary Cardiologist (physician) and Advanced Practice Providers (APPs -  Physician Assistants and Nurse Practitioners) who all work together to provide you with the care you need, when you need it.  We recommend signing up for the patient portal called "MyChart".  Sign up information is provided on this After Visit Summary.  MyChart is used to connect with patients for Virtual Visits (Telemedicine).  Patients are able to view lab/test results, encounter notes, upcoming appointments, etc.  Non-urgent messages can be sent to your provider as well.   To learn more about what you can do with MyChart, go to [email or web address].    Your next appointment:   Please schedule a 6 month follow up visit with Dr. Lovena Le.    The format for your next appointment:   In Person  Provider:   [name], MD{or one of the following Advanced Practice Providers on your designated Care Team:   Tommye Standard, [place] Legrand Como "Jonni Sanger" Chalmers Cater, [place]  Remote monitoring is used to monitor your ICD from home. This monitoring reduces the number of  office visits required to check your device to one time per year. It allows [place] to keep an eye on the functioning of your device to ensure it is working properly. You are scheduled for a device check from home on 06/23/22. You may send your transmission at any time that day. If you have a wireless device, the transmission will be sent automatically. After your physician reviews your transmission, you will receive a postcard with your next transmission date.  Important Information About [place]

## 2022-06-10 NOTE — Progress Notes (Signed)
HPI [name] returns today for ongoing evaluation and treatment of VT, chronic systolic heart failure and atrial fib/flutter. He has a long h/o chronic systolic heart failure and sudden death with VF. He has undergone insertion. He has a h/o device infection requiring extraction including the removal of an old Medtronic Starfix lead. He has undergone 4 VT ablations, one by me and 3 by Dr. Everette Rank. He has been placed on quinidine in addition to amiodarone. He does not tolerated the amiodarone but has been taking it along with quinidine. He has slow VT and multiple ATPs with the last ICD shock for a slow VT in July. He has had his VT lowered to detect at 109/min. He has had several successful ATP's.   No Known Allergies   Current Outpatient Medications  Medication Sig Dispense Refill   acetaminophen (TYLENOL) 500 MG tablet Take 1,000 mg by mouth every 6 (six) hours as needed (pain).     amiodarone (PACERONE) 200 MG tablet Take 1 tablet (200 mg total) by mouth daily. 90 tablet 1   amoxicillin (AMOXIL) 500 MG capsule TAKE 4 CAPSULES BY MOUTH 30 MINUTES PRIOR TO DENTAL WORK 4 capsule 2   furosemide (LASIX) 20 MG tablet Take 2 tablets (40 mg total) by mouth in the morning AND 1 tablet (20 mg total) every evening. 180 tablet 2   levothyroxine (SYNTHROID) 75 MCG tablet Take 1 tablet (75 mcg total) by mouth daily before breakfast. 90 tablet 3   metoprolol succinate (TOPROL-XL) 25 MG 24 hr tablet Take 1 tablet (25 mg total) by mouth daily. 90 tablet 3   Multiple Vitamin (MULTIVITAMIN WITH MINERALS) TABS tablet Take 1 tablet by mouth in the morning.     nitroGLYCERIN (NITROSTAT) 0.4 MG SL tablet Place 1 tablet (0.4 mg total) under the tongue every 5 (five) minutes x 3 doses as needed for chest pain. 25 tablet 2   quiniDINE gluconate 324 MG CR tablet Take 1 tablet (324 mg total) by mouth 2 (two) times daily. 60 tablet 11   spironolactone (ALDACTONE) 25 MG tablet Take 0.5 tablets (12.5 mg total) by mouth  daily. 90 tablet 3   valsartan (DIOVAN) 40 MG tablet Take 40 mg by mouth 2 (two) times daily.     Vericiguat (VERQUVO) 10 MG TABS Take 10 mg by mouth daily. 30 tablet 11   warfarin (COUMADIN) 5 MG tablet TAKE AS DIRECTED BY THE COUMADIN CLINIC 80 tablet 1   zinc gluconate 50 MG tablet Take 1 tablet (50 mg total) by mouth daily. 90 tablet 1   No current facility-administered medications for this visit.     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   Past Surgical History:  Procedure Laterality Date  BIV ICD GENERATOR CHANGEOUT N/A 05/04/2017   Procedure: BiV ICD Generator Changeout;  Surgeon: Evans Lance, MD;  Location: [facility];  Service: Cardiovascular;  Laterality: N/A;   CARDIAC CATHETERIZATION  04/24/2002   CARDIAC VALVE REPLACEMENT     CARDIOVERSION  03/09/2012   Procedure: CARDIOVERSION;  Surgeon: Evans Lance, MD;  Location: [facility];  Service: Cardiovascular;  Laterality: N/A;   CARDIOVERSION N/A 11/22/2013   Procedure: CARDIOVERSION;  Surgeon: Sanda Klein, MD;  Location: [facility];  Service: Cardiovascular;  Laterality: N/A;   dental implants     ELECTROPHYSIOLOGIC STUDY N/A 09/15/2015   Procedure: Stephanie Coup Ablation;  Surgeon: Evans Lance, MD;  Location: [facility];  Service: Cardiovascular;  Laterality: N/A;   Evacution of epidural lumbar epidural abscess  1999   INSERT / REPLACE / REMOVE PACEMAKER  11/2008   MITRAL VALVE REPLACEMENT     w [id number]   PERMANENT PACEMAKER INSERTION N/A 08/05/2011   Procedure: PERMANENT PACEMAKER INSERTION;  Surgeon: Evans Lance, MD;  Location: [facility];  Service: Cardiovascular;  Laterality: N/A;   RIGHT HEART CATH N/A 02/26/2021   Procedure: RIGHT HEART CATH;  Surgeon: Larey Dresser, MD;  Location: [facility];  Service: Cardiovascular;  Laterality: N/A;   THYROIDECTOMY     TONSILLECTOMY     VALVE REPLACEMENT  2000     Family History  Problem Relation Age of Onset   Heart disease Mother    Heart failure Mother    Heart disease Father    Heart failure Father      Social History   Socioeconomic History   Marital status: Married    Spouse name: Not on file   Number of children: 1   Years of education: 8   Highest education level: Master's degree (e.g., MA, MS, MEng, MEd, MSW, MBA)  Occupational History   Occupation: retired from [employer], manufacturing systems  Tobacco Use   Smoking status: Never   Smokeless tobacco: Never  Vaping Use   Vaping Use: Never used  Substance and Sexual Activity   Alcohol use: Yes    Alcohol/week: 0.0 standard drinks of alcohol    Comment: occasionally   Drug use: No   Sexual activity: Not on file  Other Topics Concern   Not on file  Social History Narrative   Lives with wife in a one story home.  Has one daughter.  Retired.  Education: Masters.   Social Determinants of Health   Financial Resource Strain: Not on file  Food Insecurity: Not on file  Transportation Needs: Not on file  Physical Activity: Not on file  Stress: Not on file  Social Connections: Not on file  Intimate Partner Violence: Not on file     BP 102/68   Pulse 78   Ht 6' 1.5" (1.867 m)   Wt 206 lb 9.6 oz (93.7 kg)   SpO2 97%   BMI 26.89 kg/m   Physical Exam:  Well appearing  NAD HEENT: Unremarkable Neck:  No JVD, no thyromegally Lymphatics:  No adenopathy Back:  No CVA tenderness Lungs:  Clear HEART:  Regular rate rhythm, no murmurs, no rubs, no clicks Abd:  soft, positive bowel sounds, no organomegally, no rebound, no guarding Ext:  2 plus pulses, no edema, no cyanosis, no clubbing Skin:  No rashes no nodules Neuro:  CN II through XII intact, motor grossly intact  EKG - NSR with AV pacing  DEVICE  Normal  device function.  See PaceArt for details.   Assess/Plan: VT - he will continue amio and quinidine.  ICD - we increased his ATP and turned off the VT therapy.  Chronic systolic heart failure - he has class 2B symptoms, at times class 3. He will continue his current meds. Atrial fib/flutter - he is in NSR today. Not sure why.   Austin Overlie Nickalous Stingley,MD

## 2022-06-14 ENCOUNTER — Ambulatory Visit (INDEPENDENT_AMBULATORY_CARE_PROVIDER_SITE_OTHER): Payer: Medicare Other

## 2022-06-14 DIAGNOSIS — I5022 Chronic systolic (congestive) heart failure: Secondary | ICD-10-CM

## 2022-06-14 DIAGNOSIS — Z95810 Presence of automatic (implantable) cardiac defibrillator: Secondary | ICD-10-CM | POA: Diagnosis not present

## 2022-06-14 NOTE — Progress Notes (Unsigned)
EPIC Encounter for ICM Monitoring  Patient Name: Austin [name] is a 81 y.o. male Date: 06/14/2022 Primary Care Physican: Janith Lima, MD Primary Cardiologist: [place] Electrophysiologist: Lovena Le 09/02/2021 Weight: 194.7 lbs 12/23/2021 Weight: 194.7 lbs 02/23/2022 Weight: 195 lbs   Clinical Status Since 10-Jun-2022 VT-NS (>4 beats, >109 bpm)    3 AT/AF 9 Time in AT/AF 0.1 hr/day (0.6%) Longest AT/AF 5 minutes                Attempted call to patient and unable to reach.  Left detailed message per DPR regarding transmission. Transmission reviewed.    Optivol Thoracic impedance trending close to baseline.     Prescribed:  Furosemide 20 mg Take 2 tablets (40 mg total) by mouth every morning AND 1 tablet (20 mg total) every evening.   He self adjusts Furosemide when needed.    Labs: 06/03/2022 Creatinine 2.05, BUN 38, Potassium 4.2, Sodium 136, GFR 32 05/24/2022 Creatinine 2.01, BUN 42, Potassium 4.3, Sodium 136, GFR 33  05/17/2022 Creatinine 1.72, BUN 37, Potassium 4.0, Sodium 135, GFR 37.33  02/19/2022 Creatinine 1.49, BUN 35, Potassium 4.2, Sodium 139, GFR A complete set of results can be found in Results Review.   Recommendations:   Unable to reach.     Follow-up plan: ICM clinic phone appointment on 08/02/2022.   91 day device clinic remote transmission 06/23/2022.      EP/Cardiology Office Visits:  07/05/2022 with HF Clinic.   12/07/2022 with Dr Lovena Le.  Recall 10/10/2022 with Dr Tamala Julian.     Copy of ICM check sent to Dr. Lovena Le.       3 month ICM trend: 06/14/2022.    12-14 Month ICM trend:     [name], RN 06/14/2022 12:36 PM

## 2022-06-16 ENCOUNTER — Ambulatory Visit (INDEPENDENT_AMBULATORY_CARE_PROVIDER_SITE_OTHER): Payer: Medicare Other | Admitting: *Deleted

## 2022-06-16 ENCOUNTER — Telehealth: Payer: Self-pay

## 2022-06-16 DIAGNOSIS — I4891 Unspecified atrial fibrillation: Secondary | ICD-10-CM | POA: Diagnosis not present

## 2022-06-16 DIAGNOSIS — Z952 Presence of prosthetic heart valve: Secondary | ICD-10-CM | POA: Diagnosis not present

## 2022-06-16 DIAGNOSIS — Z5181 Encounter for therapeutic drug level monitoring: Secondary | ICD-10-CM | POA: Diagnosis not present

## 2022-06-16 LAB — POCT INR: INR: 3.9 — AB (ref 2.0–3.0)

## 2022-06-16 NOTE — Patient Instructions (Signed)
Description   Spoke with pt and instructed to take 1/2 tablet today then start taking warfarin 1/2 tablet daily except for 1 tablet on Mondays and Fridays. Recheck INR in 2 weeks (self tester). Coumadin Clinic [phone].  AMIO DOSE INCREASED TO '400MG'$  ON SAT & SUN ALL OTHER DAYS STAY AT '200MG'$ .   Pt's cell phone [phone] in case difficulty reaching pt on home phone.

## 2022-06-16 NOTE — Telephone Encounter (Signed)
Remote ICM transmission received.  Attempted call to patient regarding ICM remote transmission and left detailed message per DPR.  Advised to return call for any fluid symptoms or questions.  

## 2022-06-16 NOTE — Progress Notes (Signed)
Spoke with patient and heart failure questions reviewed.  Transmission results reviewed.  Pt reports weight increase and swelling in ankles during decreased impedance. No changes and encouraged to call if experiencing any fluid symptoms.   He has meal plans at [facility] and advised may be more salt in the foods than he realizes.  Recommendation to limit salt intake to 2000 mg daily and fluid intake to 64 oz daily.  Encouraged to call if experiencing any fluid symptoms.

## 2022-06-23 ENCOUNTER — Ambulatory Visit (INDEPENDENT_AMBULATORY_CARE_PROVIDER_SITE_OTHER): Payer: Medicare Other

## 2022-06-23 DIAGNOSIS — I442 Atrioventricular block, complete: Secondary | ICD-10-CM

## 2022-06-23 LAB — CUP PACEART REMOTE DEVICE CHECK
Battery Remaining Longevity: 25 mo
Battery Voltage: 2.94 V
Brady Statistic AP VP Percent: 35.8 %
Brady Statistic AP VS Percent: 1.44 %
Brady Statistic AS VP Percent: 49.97 %
Brady Statistic AS VS Percent: 12.79 %
Brady Statistic RA Percent Paced: 29.94 %
Brady Statistic RV Percent Paced: 82.86 %
Date Time Interrogation Session: 20231129023324
HighPow Impedance: 64 Ohm
Implantable Lead Connection Status: 753985
Implantable Lead Connection Status: 753985
Implantable Lead Implant Date: 20130110
Implantable Lead Implant Date: 20130110
Implantable Lead Location: 753859
Implantable Lead Location: 753860
Implantable Lead Model: 5076
Implantable Lead Model: 6935
Implantable Pulse Generator Implant Date: 20181010
Lead Channel Impedance Value: 342 Ohm
Lead Channel Impedance Value: 361 Ohm
Lead Channel Impedance Value: 418 Ohm
Lead Channel Pacing Threshold Amplitude: 1 V
Lead Channel Pacing Threshold Pulse Width: 0.4 ms
Lead Channel Sensing Intrinsic Amplitude: 0.375 mV
Lead Channel Sensing Intrinsic Amplitude: 0.375 mV
Lead Channel Sensing Intrinsic Amplitude: 5.125 mV
Lead Channel Sensing Intrinsic Amplitude: 5.125 mV
Lead Channel Setting Pacing Amplitude: 2.5 V
Lead Channel Setting Pacing Amplitude: 3.5 V
Lead Channel Setting Pacing Pulse Width: 0.4 ms
Lead Channel Setting Sensing Sensitivity: 0.3 mV
Zone Setting Status: 755011

## 2022-06-24 ENCOUNTER — Telehealth: Payer: Self-pay

## 2022-06-24 NOTE — Telephone Encounter (Signed)
CV Remote Solution Alert Received.   Scheduled remote reviewed. Normal device function.  Known atrial flutter, on [place], good ventricular rate control, AF burden is 1.8% of the time.There were three VT arrhythmias detected that were all successfully converted after one burst of ATP.  Patient was seen in-clinic 06/10/22 by Dr. Lovena Le and increased ATP and turned off VT therapy. Also on Amio and quinidine.   Patient reports of intermittent shortness of breath (not a new finding for patient). Denies any other symptoms for patient other than feeling the ATP therapy. Patient reports compliance with medications on file. Advised of [place] DMV driving restrictions x6 months and shock plan. Patient voice understanding.

## 2022-06-24 NOTE — Telephone Encounter (Signed)
No change 

## 2022-06-28 DIAGNOSIS — L570 Actinic keratosis: Secondary | ICD-10-CM | POA: Diagnosis not present

## 2022-06-28 DIAGNOSIS — L821 Other seborrheic keratosis: Secondary | ICD-10-CM | POA: Diagnosis not present

## 2022-06-28 DIAGNOSIS — D485 Neoplasm of uncertain behavior of skin: Secondary | ICD-10-CM | POA: Diagnosis not present

## 2022-06-28 DIAGNOSIS — D1801 Hemangioma of skin and subcutaneous tissue: Secondary | ICD-10-CM | POA: Diagnosis not present

## 2022-06-28 DIAGNOSIS — C44722 Squamous cell carcinoma of skin of right lower limb, including hip: Secondary | ICD-10-CM | POA: Diagnosis not present

## 2022-06-28 DIAGNOSIS — D692 Other nonthrombocytopenic purpura: Secondary | ICD-10-CM | POA: Diagnosis not present

## 2022-06-28 DIAGNOSIS — Z85828 Personal history of other malignant neoplasm of skin: Secondary | ICD-10-CM | POA: Diagnosis not present

## 2022-06-28 DIAGNOSIS — C44729 Squamous cell carcinoma of skin of left lower limb, including hip: Secondary | ICD-10-CM | POA: Diagnosis not present

## 2022-06-28 DIAGNOSIS — L853 Xerosis cutis: Secondary | ICD-10-CM | POA: Diagnosis not present

## 2022-06-28 DIAGNOSIS — L812 Freckles: Secondary | ICD-10-CM | POA: Diagnosis not present

## 2022-06-30 ENCOUNTER — Ambulatory Visit (INDEPENDENT_AMBULATORY_CARE_PROVIDER_SITE_OTHER): Payer: Medicare Other | Admitting: *Deleted

## 2022-06-30 DIAGNOSIS — Z5181 Encounter for therapeutic drug level monitoring: Secondary | ICD-10-CM | POA: Diagnosis not present

## 2022-06-30 DIAGNOSIS — I4891 Unspecified atrial fibrillation: Secondary | ICD-10-CM | POA: Diagnosis not present

## 2022-06-30 LAB — POCT INR: INR: 4.3 — AB (ref 2.0–3.0)

## 2022-07-05 ENCOUNTER — Encounter (HOSPITAL_COMMUNITY): Payer: Self-pay

## 2022-07-05 ENCOUNTER — Ambulatory Visit (HOSPITAL_COMMUNITY)
Admission: RE | Admit: 2022-07-05 | Discharge: 2022-07-05 | Disposition: A | Discharge status: Home / Self Care | Payer: Medicare Other | Source: Ambulatory Visit | Attending: Family Medicine | Admitting: Family Medicine

## 2022-07-05 VITALS — BP 110/78 | HR 78 | Wt 200.0 lb

## 2022-07-05 DIAGNOSIS — I480 Paroxysmal atrial fibrillation: Secondary | ICD-10-CM | POA: Insufficient documentation

## 2022-07-05 DIAGNOSIS — I4819 Other persistent atrial fibrillation: Secondary | ICD-10-CM

## 2022-07-05 DIAGNOSIS — I4892 Unspecified atrial flutter: Secondary | ICD-10-CM | POA: Insufficient documentation

## 2022-07-05 DIAGNOSIS — J9811 Atelectasis: Secondary | ICD-10-CM | POA: Diagnosis not present

## 2022-07-05 DIAGNOSIS — Z952 Presence of prosthetic heart valve: Secondary | ICD-10-CM | POA: Diagnosis not present

## 2022-07-05 DIAGNOSIS — I442 Atrioventricular block, complete: Secondary | ICD-10-CM

## 2022-07-05 DIAGNOSIS — Z79890 Hormone replacement therapy (postmenopausal): Secondary | ICD-10-CM | POA: Diagnosis not present

## 2022-07-05 DIAGNOSIS — I5022 Chronic systolic (congestive) heart failure: Secondary | ICD-10-CM | POA: Diagnosis not present

## 2022-07-05 DIAGNOSIS — R0600 Dyspnea, unspecified: Secondary | ICD-10-CM | POA: Diagnosis not present

## 2022-07-05 DIAGNOSIS — I472 Ventricular tachycardia, unspecified: Secondary | ICD-10-CM | POA: Diagnosis not present

## 2022-07-05 DIAGNOSIS — Z79899 Other long term (current) drug therapy: Secondary | ICD-10-CM | POA: Diagnosis not present

## 2022-07-05 DIAGNOSIS — E039 Hypothyroidism, unspecified: Secondary | ICD-10-CM | POA: Diagnosis not present

## 2022-07-05 DIAGNOSIS — Z7901 Long term (current) use of anticoagulants: Secondary | ICD-10-CM | POA: Insufficient documentation

## 2022-07-05 LAB — BASIC METABOLIC PANEL
Anion gap: 9 (ref 5–15)
BUN: 49 mg/dL — ABNORMAL HIGH (ref 8–23)
CO2: 28 mmol/L (ref 22–32)
Calcium: 9 mg/dL (ref 8.9–10.3)
Chloride: 101 mmol/L (ref 98–111)
Creatinine, Ser: 2.02 mg/dL — ABNORMAL HIGH (ref 0.61–1.24)
GFR, Estimated: 33 mL/min — ABNORMAL LOW (ref >60)
Glucose, Bld: 92 mg/dL (ref 70–99)
Potassium: 4.8 mmol/L (ref 3.5–5.1)
Sodium: 138 mmol/L (ref 135–145)

## 2022-07-05 LAB — BRAIN NATRIURETIC PEPTIDE: B Natriuretic Peptide: 539.1 pg/mL — ABNORMAL HIGH (ref 0.0–100.0)

## 2022-07-05 NOTE — Patient Instructions (Signed)
Thank you for coming in today  Labs were done today, if any labs are abnormal the clinic will call you No news is good news  Chest xray will be done today   Your physician recommends that you schedule a follow-up appointment in: 3-4 month with Dr. Aundra Dubin with echocardiogram  Your physician has requested that you have an echocardiogram. Echocardiography is a painless test that uses sound waves to create images of your heart. It provides your doctor with information about the size and shape of your heart and how well your heart's chambers and valves are working. This procedure takes approximately one hour. There are no restrictions for this procedure.   You will be scheudled for pulmonary function test     Do the following things EVERYDAY: Weigh yourself in the morning before breakfast. Write it down and keep it in a log. Take your medicines as prescribed Eat low salt foods--Limit salt (sodium) to 2000 mg per day.  Stay as active as you can everyday Limit all fluids for the day to less than 2 liters  At the [facility], you and your health needs are our priority. As part of our continuing mission to provide you with exceptional heart care, we have created designated Provider Care Teams. These Care Teams include your primary Cardiologist (physician) and Advanced Practice Providers (APPs- Physician Assistants and Nurse Practitioners) who all work together to provide you with the care you need, when you need it.   You may see any of the following providers on your designated Care Team at your next follow up: Dr Glori Bickers Dr Loralie Champagne Dr. Roxana Hires, NP Lyda Jester, [place] [facility] [place], [place] Forestine Na, NP Audry Riles, PharmD   Please be sure to bring in all your medications bottles to every appointment.   If you have any questions or concerns before your next appointment please send [place] a message through [place] or call  our office at [phone].    TO LEAVE A MESSAGE FOR THE NURSE SELECT OPTION 2, PLEASE LEAVE A MESSAGE INCLUDING: YOUR NAME DATE OF BIRTH CALL BACK NUMBER REASON FOR CALL**this is important as we prioritize the call backs  YOU WILL RECEIVE A CALL BACK THE SAME DAY AS LONG AS YOU CALL BEFORE 4:00 PM

## 2022-07-05 NOTE — Progress Notes (Signed)
PCP: Janith Lima, MD Cardiology: Dr. Tamala Julian HF Cardiology: Dr. Aundra Dubin EP: Dr. Lovena Le  81 y.o. with history of mechanical mitral valve s/p MV endocarditis, VT, nonischemic cardiomyopathy, complete heart block was referred by Dr. Tamala Julian for evaluation of CHF.  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 He subsequently developed a presumed nonischemic cardiomyopathy.  This was complicated by VT, he had a VT ablation in 2017 and had a Medtronic CRT-D device implanted. This had to be removed due to infection in 11/12 and was reimplanted in 1/13. Last echo in 5/22 showed EF 30% with normal RV, mechanical MV stable with mean gradient 3 mmHg.  He has been in atrial fibrillation persistently since late 2021.  He is on amiodarone and has been cardioverted in the past. His LV lead is not functional.  He has complete heart block and is persistently RV pacing.   Had [place] 02/26/21 RA 4, PA 32/12, PCWP 11, CO 4.6, CI 2.2. Normal filling pressures and slightly low cardiac output.   Digoxin was stopped due to elevated level.   He saw Dr. Lovena Le in 9/22 and was in slow VT in the office.  He was paced out of VT and amiodarone was increased to 200 mg daily.   He saw Dr. Lovena Le, and placement of a left bundle or LV lead was decided to be too risky a procedure.  He was referred to Dr. Oneida Alar with the hope that he could have a VT ablation and get off amiodarone, which is likely playing a role in his symptoms.     S/p VT ablation with Dr. Oneida Alar 1/23.  Echo in 4/23 showed EF 20-25% with diffuse hypokinesis and septal-lateral dyssynchrony, mildly decreased RV systolic function, mechanical MV with normal function, dilated IVC.   He was seen in the office in 7/23, noted to be in atrial flutter in the atria and slow VT in the ventricles.  VT was pace terminated by Dr. Caryl Comes and lower threshold set for ATP.  Later in 7/23, he had another VT episode and had an ICD discharge.   Quinidine was then started by Dr. Lovena Le.   Follow up 10/23, had runs of VT terminated by ATP. Stable NYHA III and volume stable by OptiVol.  Today he returns for HF follow up with his wife. Overall feeling fine. Main issue is he has dyspnea with walking and ADLs, SOB walking > 50 feet. However he works out 4x/week at gym at [employer] home ([employer]) and does not have breathing difficulties. Recently had a couple episodes of low BP 70-80s/40-50s. With lightheadedness, no falls. These have since resolved, he attributes this to cutting out his coffee. Denies palpitations, CP, abnormal bleeding, edema, or PND/Orthopnea. Appetite ok. No fever or chills. Weight at home 195-201 pounds. Taking all medications.   Medtronic device interrogation: thoracic impedence stable, 3 episodes of VT with ATP since 11/23 but no shocks, occasional short AF/AFL (longest lasting 8 minutes).  89.5% RV pacing, 1 hr/day activity  ECG (personally reviewed): none ordered today.  Labs (7/22): K 4.6, creatinine 1.3 Labs (8/22): K 4.4, creatinine 1.23 Labs (10/22): K 3.9, creatinine 1.36 Labs (12/22): K 4.2, creatinine 1.39 Labs (1/23): K 4.3, creatinine 1.15 Labs (2/23): TSH 10, K 4.1, creatinine 1.54 Labs (6/23): BNP 798, K 4.1, creatinine 1.3 Labs (7/23): BNP 1244, LFTs normal, K 4.2, creatinine 1.49 Labs (10/23): K 4, creatinine 1.72, TSH 5.7, transferrin saturation 28%, hgb 12.8, plts 128  PMH: 1. 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 2. H/o VT: VT ablation in 2017.  - Redo VT ablation in 1/23 with Dr. Oneida Alar - Claudia Desanctis termination of VT in office in 7/23.  - ICD shock 7/23, started quinidine.  3. Atrial fibrillation/atypical flutter: Persistent since 2021.  Failed amiodarone.  4. PAD: Tibial occlusive disease.  5. 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  6. Hypothyroidism.  7. Chronic systolic CHF: Presumed nonischemic cardiomyopathy.  Medtronic CRT-D device, LV lead is  nonfunctional.  - Echo (2012): EF 45% - Echo (1/17): EF 30-35% - Echo (6/21): EF 15-20% - Echo (11/21): EF 30-35%  - Echo (5/22): EF 30%, normal RV, mechanical mitral valve mean gradient 3 mmHg.  - RHC (8/22): RA mean 4, RV 33/4,PA 32/12 mean 21, PCWP mean 11, Oxygen saturations:PA 71%, AO 98%, Cardiac Output (Fick) 4.66 Cardiac Index (Fick) 2.18 - Echo (4/23): EF 20-25% with diffuse hypokinesis and septal-lateral dyssynchrony, mildly decreased RV systolic function, mechanical MV with normal function, dilated IVC.   Social History   Socioeconomic History   Marital status: Married    Spouse name: Not on file   Number of children: 1   Years of education: 26   Highest education level: Master's degree (e.g., MA, MS, MEng, MEd, MSW, MBA)  Occupational History   Occupation: retired from [employer], manufacturing systems  Tobacco Use   Smoking status: Never   Smokeless tobacco: Never  Vaping Use   Vaping Use: Never used  Substance and Sexual Activity   Alcohol use: Yes    Alcohol/week: 0.0 standard drinks of alcohol    Comment: occasionally   Drug use: No   Sexual activity: Not on file  Other Topics Concern   Not on file  Social History Narrative   Lives with wife in a one story home.  Has one daughter.  Retired.  Education: Masters.   Social Determinants of Health   Financial Resource Strain: Not on file  Food Insecurity: Not on file  Transportation Needs: Not on file  Physical Activity: Not on file  Stress: Not on file  Social Connections: Not on file  Intimate Partner Violence: Not on file   Family History  Problem Relation Age of Onset   Heart disease Mother    Heart failure Mother    Heart disease Father    Heart failure Father    ROS: All systems reviewed and negative except as per HPI.   Current Outpatient Medications  Medication Sig Dispense Refill   acetaminophen (TYLENOL) 500 MG tablet Take 1,000 mg by mouth every 6 (six) hours as needed (pain).     amiodarone (PACERONE)  200 MG tablet Take 1 tablet (200 mg total) by mouth daily. 90 tablet 1   amoxicillin (AMOXIL) 500 MG capsule TAKE 4 CAPSULES BY MOUTH 30 MINUTES PRIOR TO DENTAL WORK 4 capsule 2   furosemide (LASIX) 20 MG tablet Take 2 tablets (40 mg total) by mouth in the morning AND 1 tablet (20 mg total) every evening. 180 tablet 2   levothyroxine (SYNTHROID) 75 MCG tablet Take 1 tablet (75 mcg total) by mouth daily before breakfast. 90 tablet 3   metoprolol succinate (TOPROL-XL) 25 MG 24 hr tablet Take 1 tablet (25 mg total) by mouth daily. 90 tablet 3   Multiple Vitamin (MULTIVITAMIN WITH MINERALS) TABS tablet Take 1 tablet by mouth in the morning.     nitroGLYCERIN (NITROSTAT) 0.4 MG SL tablet Place 1 tablet (0.4 mg total) under the tongue every 5 (five) minutes x 3  doses as needed for chest pain. 25 tablet 2   quiniDINE gluconate 324 MG CR tablet Take 1 tablet (324 mg total) by mouth 2 (two) times daily. 60 tablet 11   spironolactone (ALDACTONE) 25 MG tablet Take 0.5 tablets (12.5 mg total) by mouth daily. 90 tablet 3   valsartan (DIOVAN) 40 MG tablet Take 40 mg by mouth daily.     Vericiguat (VERQUVO) 10 MG TABS Take 10 mg by mouth daily. 30 tablet 11   warfarin (COUMADIN) 5 MG tablet TAKE AS DIRECTED BY THE COUMADIN CLINIC 80 tablet 1   zinc gluconate 50 MG tablet Take 1 tablet (50 mg total) by mouth daily. 90 tablet 1   No current facility-administered medications for this encounter.   BP 110/78   Pulse 78   Wt 90.7 kg (200 lb)   SpO2 98%   BMI 26.03 kg/m   Wt Readings from Last 3 Encounters:  07/05/22 90.7 kg (200 lb)  06/10/22 93.7 kg (206 lb 9.6 oz)  05/24/22 92.1 kg (203 lb)   Physical Exam General:  NAD. No resp difficulty, walked into clinic. HEENT: Normal Neck: Supple. No JVD. Carotids 2+ bilat; no bruits. No lymphadenopathy or thryomegaly appreciated. Cor: PMI nondisplaced. Regular rate & rhythm. No rubs, gallops or murmurs. Lungs: Clear, decreased in bases Abdomen: Soft,  nontender, nondistended. No hepatosplenomegaly. No bruits or masses. Good bowel sounds. Extremities: No cyanosis, clubbing, rash, 1+ BLE pre-tibial edema Neuro: Alert & oriented x 3, cranial nerves grossly intact. Moves all 4 extremities w/o difficulty. Affect pleasant.  Assessment/Plan: 1. Chronic systolic CHF: Long-standing, suspected nonischemic cardiomyopathy.  Had [place] on 02/26/21 with normal filling pressures and mildly low cardiac output.  Echo in 4/23 showed  EF 20-25% with diffuse hypokinesis and septal-lateral dyssynchrony, mildly decreased RV systolic function, mechanical MV with normal function, dilated IVC.  He has a Medtronic CRT-D device present, but the LV lead is not functional.  He has complete heart block, so has been RV paced chronically.   He is predominantly in NSR with short occasional runs of AF/AFL.  NYHA class III-IIIb, worse recently.  He is not volume overloaded today by OptiVol, weight down 3 lbs. - With worsening dyspnea, check PFTs, CXR and update echo. - Continue Lasix 40 qam/20 qpm.  I will not increase this with creatinine up to 2 today.  - Continue spironolactone 12.5 daily, will not increase due to soft BP and higher creatinine.   - With orthostatic symptoms, he is now off Entresto and on valsartan 40 mg bid (continue).   - Continue Toprol XL 25 mg qhs.  - Continue Verquvo 10 mg daily.  - Off digoxin due to persistently elevated level.  - The patient has CHB and has been chronically RV pacing as his LV lead is nonfunctional (when not in VT).  This is not ideal and creates dyssynchrony.  Remains symptomatic with limited ability to titrate meds. He has seen Dr. Lovena Le, it would be possible to place left bundle lead but procedure would be complicated.  After further discussion with Dr. Lovena Le, it was decided that risk would be too high and left bundle lead will not be attempted.   2. Atrial fibrillation/flutter: Paroxysmal.   - He is on amiodarone 200 mg daily.  LFTs OK  10/23, he is on Levoxyl for hypothyroidism.  He will need a regular eye exam. As above, with check CXR and PFTs in setting on amiodarone use. - Continue Warfarin.  3. Mechanical mitral valve: Valve  looked normal on 4/23 echo.   - Continue warfarin, INR goal 3-3.5.  - He has not been on ASA.  4. VT: History of VT and VT ablation in 2017.  MDT ICD.  Redo VT ablation in 1/23 (Dr. Oneida Alar).  He is on amiodarone, this was increased to 200 mg daily recently with recurrent VT.  He has tolerated amiodarone poorly historically.  In 7/23, slow VT was pace-terminated in the office, then he had an ICD shock for VT later in 7/23.  He was started on quinidine after this.  Still having periodic VT treated with ATP, asymptomatic.  - Continue amiodarone 200 mg daily.  - Continue quinidine.  5. Complete heart block: Per notes, patient is pacer dependent due to CHB.  - See above, upgrade to CRT thought to be too risky.    Follow up in 3 months with Dr. Aundra Dubin.  Hemby Bridge, FNP-BC 07/05/2022

## 2022-07-08 ENCOUNTER — Telehealth (HOSPITAL_COMMUNITY): Payer: Self-pay

## 2022-07-08 NOTE — Telephone Encounter (Signed)
Patient aware of results.

## 2022-07-09 ENCOUNTER — Telehealth: Payer: Self-pay

## 2022-07-09 NOTE — Telephone Encounter (Signed)
..     Pre-operative Risk Assessment    Patient Name: Austin [name]  DOB: [birth year] MRN: [id number]     Request for Surgical Clearance    Procedure:  Dental Extraction - Amount of Teeth to be Pulled:  1  Date of Surgery:  Clearance TBD                                 Surgeon:  DR Janne Napoleon Surgeon's Group or Practice Name:  [facility] Phone number:  [phone] Fax number:  [phone]   Type of Clearance Requested:   - Medical  - Pharmacy:  Hold Warfarin (Coumadin)     Type of Anesthesia:  Not Indicated   Additional requests/questions:    Gwenlyn Found   07/09/2022, 9:54 AM

## 2022-07-09 NOTE — Telephone Encounter (Signed)
   Primary Cardiologist: Sinclair Grooms, MD  Chart reviewed as part of pre-operative protocol coverage. Simple dental extractions are considered low risk procedures per guidelines and generally do not require any specific cardiac clearance. It is also generally accepted that for simple extractions and dental cleanings, there is no need to interrupt blood thinner therapy.   SBE prophylaxis is required for the patient.  I will route this recommendation to the requesting party via Epic fax function and remove from pre-op pool.  Please call with questions.  Emmaline Life, NP-C  07/09/2022, 12:07 PM [address] Office [phone] Fax [phone]

## 2022-07-12 ENCOUNTER — Ambulatory Visit (INDEPENDENT_AMBULATORY_CARE_PROVIDER_SITE_OTHER): Payer: Medicare Other

## 2022-07-12 DIAGNOSIS — Z5181 Encounter for therapeutic drug level monitoring: Secondary | ICD-10-CM

## 2022-07-12 DIAGNOSIS — I4891 Unspecified atrial fibrillation: Secondary | ICD-10-CM

## 2022-07-12 LAB — POCT INR: INR: 2.9 (ref 2.0–3.0)

## 2022-07-12 NOTE — Patient Instructions (Signed)
Description   Call [phone]; Spoke with pt and instructed to take 1 tablet today and then resume taking warfarin 1/2 tablet daily except for 1 tablet on Fridays.  Recheck INR on 07/27/22 - pt out of town until 1/2 (Personal assistant).  Coumadin Clinic [phone].  AMIO DOSE INCREASED TO '400MG'$  ON SAT & SUN ALL OTHER DAYS STAY AT '200MG'$ .   Pt's cell phone [phone] in case difficulty reaching pt on home phone.

## 2022-07-20 NOTE — Progress Notes (Signed)
Remote ICD transmission.   

## 2022-07-27 ENCOUNTER — Telehealth: Payer: Self-pay

## 2022-07-27 NOTE — Telephone Encounter (Signed)
Lpm to check INR 

## 2022-07-28 ENCOUNTER — Ambulatory Visit (INDEPENDENT_AMBULATORY_CARE_PROVIDER_SITE_OTHER): Payer: Medicare Other

## 2022-07-28 DIAGNOSIS — Z5181 Encounter for therapeutic drug level monitoring: Secondary | ICD-10-CM

## 2022-07-28 DIAGNOSIS — I4891 Unspecified atrial fibrillation: Secondary | ICD-10-CM

## 2022-07-28 LAB — POCT INR: INR: 2.9 (ref 2.0–3.0)

## 2022-07-28 NOTE — Patient Instructions (Signed)
Description   Spoke with pt and instructed to take 1 tablet today and then continue taking warfarin 1/2 tablet daily except for 1 tablet on Fridays.  Recheck INR in 1 week  Coumadin Clinic [phone].  AMIO DOSE INCREASED TO '400MG'$  ON SAT & SUN ALL OTHER DAYS STAY AT '200MG'$ .   Pt's cell phone [phone] in case difficulty reaching pt on home phone.

## 2022-08-02 ENCOUNTER — Ambulatory Visit (INDEPENDENT_AMBULATORY_CARE_PROVIDER_SITE_OTHER): Payer: Medicare Other

## 2022-08-02 ENCOUNTER — Ambulatory Visit (INDEPENDENT_AMBULATORY_CARE_PROVIDER_SITE_OTHER): Payer: Medicare Other | Admitting: Cardiology

## 2022-08-02 ENCOUNTER — Other Ambulatory Visit (HOSPITAL_COMMUNITY): Payer: Self-pay | Admitting: *Deleted

## 2022-08-02 ENCOUNTER — Telehealth: Payer: Self-pay

## 2022-08-02 ENCOUNTER — Encounter (HOSPITAL_COMMUNITY): Payer: Self-pay | Admitting: Cardiology

## 2022-08-02 DIAGNOSIS — I5022 Chronic systolic (congestive) heart failure: Secondary | ICD-10-CM

## 2022-08-02 DIAGNOSIS — Z95810 Presence of automatic (implantable) cardiac defibrillator: Secondary | ICD-10-CM

## 2022-08-02 DIAGNOSIS — Z5181 Encounter for therapeutic drug level monitoring: Secondary | ICD-10-CM

## 2022-08-02 DIAGNOSIS — I4891 Unspecified atrial fibrillation: Secondary | ICD-10-CM

## 2022-08-02 LAB — POCT INR: INR: 2.6 (ref 2.0–3.0)

## 2022-08-02 MED ORDER — VALSARTAN 40 MG PO TABS: 40.0000 mg | ORAL_TABLET | Freq: Every day | ORAL | 11 refills | Status: DC

## 2022-08-02 NOTE — Progress Notes (Signed)
EPIC Encounter for ICM Monitoring  Patient Name: Austin [name] is a 82 y.o. male Date: 08/02/2022 Primary Care Physican: Janith Lima, MD Primary Cardiologist: [facility] Electrophysiologist: Lovena Le 09/02/2021 Weight: 194.7 lbs 12/23/2021 Weight: 194.7 lbs 02/23/2022 Weight: 195 lbs   Clinical Status   Since 05-Jul-2022 VT-NS (>4 beats, >109 bpm)    2 AT/AF 256 Time in AT/AF 1.0 hr/day (4.2%) Longest AT/AF 2 hours                Attempted call to patient and unable to reach.  Left detailed message per DPR regarding transmission. Transmission reviewed.      Optivol Thoracic impedance suggesting possible fluid accumulation starting 12/18.  Fluid index greater than normal threshold starting 07/26/2022.   Prescribed:  Furosemide 20 mg Take 2 tablets (40 mg total) by mouth every morning AND 1 tablet (20 mg total) every evening.   He self adjusts Furosemide when needed.    Labs: 06/03/2022 Creatinine 2.05, BUN 38, Potassium 4.2, Sodium 136, GFR 32 05/24/2022 Creatinine 2.01, BUN 42, Potassium 4.3, Sodium 136, GFR 33  05/17/2022 Creatinine 1.72, BUN 37, Potassium 4.0, Sodium 135, GFR 37.33  02/19/2022 Creatinine 1.49, BUN 35, Potassium 4.2, Sodium 139, GFR A complete set of results can be found in Results Review.   Recommendations:   Unable to reach.     Follow-up plan: ICM clinic phone appointment on 08/10/2022 (manual) to recheck fluid levels.   91 day device clinic remote transmission 09/22/2022.      EP/Cardiology Office Visits:  09/28/2022 with Dr Aundra Dubin.   12/07/2022 with Dr Lovena Le.  Recall 10/10/2022 with Dr Tamala Julian.     Copy of ICM check sent to Dr. Lovena Le.   Will send to Dr Aundra Dubin for review if patient is reached.   3 month ICM trend: 08/02/2022.    12-14 Month ICM trend:     [name], RN 08/02/2022 2:29 PM

## 2022-08-02 NOTE — Telephone Encounter (Signed)
Remote ICM transmission received.  Attempted call to patient regarding ICM remote transmission and left message to return call   

## 2022-08-02 NOTE — Progress Notes (Signed)
Spoke with patient and heart failure questions reviewed.  Transmission results reviewed.  Pt's weight increased 3 lbs up to 203 lbs but starting to return to baseline.  Weight today 201.6 lbs.  He was in [place] for 2 weeks over the holidays and was difficult to follow low salt diet.  Will recheck fluid levels 08/10/2022.

## 2022-08-02 NOTE — Patient Instructions (Addendum)
Description   Spoke with pt and instructed to take 1 tablet tomorrow (does not want to take extra today since he has extraction in the morning) and then start taking warfarin 1/2 tablet daily except for 1 tablet on Monday and Fridays. Recheck INR in 1 week. Coumadin Clinic [phone].  AMIO DOSE INCREASED TO '400MG'$  ON SAT & SUN ALL OTHER DAYS STAY AT '200MG'$ .   Pt's cell phone [phone] in case difficulty reaching pt on home phone.

## 2022-08-02 NOTE — Telephone Encounter (Signed)
Lpmtcb and discuss INR result/recommendation. 

## 2022-08-09 ENCOUNTER — Ambulatory Visit (INDEPENDENT_AMBULATORY_CARE_PROVIDER_SITE_OTHER): Payer: Medicare Other

## 2022-08-09 DIAGNOSIS — I5022 Chronic systolic (congestive) heart failure: Secondary | ICD-10-CM

## 2022-08-09 DIAGNOSIS — Z95810 Presence of automatic (implantable) cardiac defibrillator: Secondary | ICD-10-CM

## 2022-08-10 NOTE — Progress Notes (Signed)
EPIC Encounter for ICM Monitoring  Patient Name: Austin [name] is a 82 y.o. male Date: 08/10/2022 Primary Care Physican: Janith Lima, MD Primary Cardiologist: [facility] Electrophysiologist: Lovena Le 09/02/2021 Weight: 194.7 lbs 12/23/2021 Weight: 194.7 lbs 02/23/2022 Weight: 195 lbs 08/02/2022 Weight: 201.6 lbs 08/10/2022 Weight: 202 lbs   Clinical Status Since 02-Aug-2022 AT/AF               94 Time in AT/AF 0.6 hr/day (2.6%) Longest AT/AF 8 minutes                Spoke with patient and heart failure questions reviewed.  Transmission results reviewed.  Pt reports weight remains above baseline and self adjusted Furosemide to 2 tablets twice a day for approximately 2 weeks.       Optivol Thoracic impedance suggesting possible fluid accumulation continues starting 12/18.  Fluid index greater than normal threshold starting 07/26/2022.   Prescribed:  Furosemide 20 mg Take 2 tablets (40 mg total) by mouth every morning AND 1 tablet (20 mg total) every evening.   He self adjusts Furosemide when needed.    Labs: 06/03/2022 Creatinine 2.05, BUN 38, Potassium 4.2, Sodium 136, GFR 32 05/24/2022 Creatinine 2.01, BUN 42, Potassium 4.3, Sodium 136, GFR 33  05/17/2022 Creatinine 1.72, BUN 37, Potassium 4.0, Sodium 135, GFR 37.33  02/19/2022 Creatinine 1.49, BUN 35, Potassium 4.2, Sodium 139, GFR A complete set of results can be found in Results Review.   Recommendations:  He does not want to take any higher dosage of Furosemide at this time. If Leanne Lovely does not return to normal 1/23 then will send to Dr Aundra Dubin for review.   Recommendation to limit salt intake to 2000 mg daily and fluid intake to 64 oz daily.     Follow-up plan: ICM clinic phone appointment on 08/17/2022 to recheck fluid levels.   91 day device clinic remote transmission 09/22/2022.      EP/Cardiology Office Visits:  09/28/2022 with Dr Aundra Dubin.   12/07/2022 with Dr Lovena Le.  Recall 10/10/2022 with Dr Tamala Julian.     Copy of ICM check sent to  Dr. Lovena Le.     3 month ICM trend: 08/10/2022.    12-14 Month ICM trend:     [name], RN 08/10/2022 4:49 PM

## 2022-08-12 ENCOUNTER — Ambulatory Visit (INDEPENDENT_AMBULATORY_CARE_PROVIDER_SITE_OTHER): Payer: Medicare Other

## 2022-08-12 DIAGNOSIS — Z5181 Encounter for therapeutic drug level monitoring: Secondary | ICD-10-CM

## 2022-08-12 DIAGNOSIS — I4891 Unspecified atrial fibrillation: Secondary | ICD-10-CM

## 2022-08-12 LAB — POCT INR: INR: 2.3 (ref 2.0–3.0)

## 2022-08-12 NOTE — Patient Instructions (Signed)
Description   Spoke with pt and instructed to take 1 tablet today and 1.5 tablets tomorrow and then continue taking warfarin 1/2 tablet daily except for 1 tablet on Monday and Fridays.  Recheck INR in 1 week.  Coumadin Clinic [phone].  AMIO DOSE INCREASED TO '400MG'$  ON SAT & SUN ALL OTHER DAYS STAY AT '200MG'$ .   Pt's cell phone [phone] in case difficulty reaching pt on home phone.

## 2022-08-17 ENCOUNTER — Ambulatory Visit: Payer: Medicare Other | Attending: Internal Medicine

## 2022-08-17 DIAGNOSIS — I5022 Chronic systolic (congestive) heart failure: Secondary | ICD-10-CM

## 2022-08-17 DIAGNOSIS — Z95810 Presence of automatic (implantable) cardiac defibrillator: Secondary | ICD-10-CM

## 2022-08-17 NOTE — Progress Notes (Signed)
EPIC Encounter for ICM Monitoring  Patient Name: Austin [name] is a 82 y.o. male Date: 08/17/2022 Primary Care Physican: Janith Lima, MD Primary Cardiologist: [facility] Electrophysiologist: Lovena Le 09/02/2021 Weight: 194.7 lbs 12/23/2021 Weight: 194.7 lbs 02/23/2022 Weight: 195 lbs 08/02/2022 Weight: 201.6 lbs 08/10/2022 Weight: 202 lbs   Clinical Status Since 10-Aug-2022 AT/AF 43 Time in AT/AF 0.3 hr/day (1.4%) Longest AT/AF 6 minutes               Spoke with patient and heart failure questions reviewed.  Transmission results reviewed.  Pt asymptomatic for fluid accumulation.  Reports feeling well at this time and voices no complaints.     Optivol Thoracic impedance suggesting possible fluid accumulation continues starting 12/18 with exception 2 days at baseline.  Fluid index greater than normal threshold starting 07/26/2022.   Prescribed:  Furosemide 20 mg Take 2 tablets (40 mg total) by mouth every morning AND 1 tablet (20 mg total) every evening.   He self adjusts Furosemide when needed.    Labs: 07/01/2022 Creatinine 2.02, BUN 49, Potassium 4.8, Sodium 138, GFR 33 06/03/2022 Creatinine 2.05, BUN 38, Potassium 4.2, Sodium 136, GFR 32 05/24/2022 Creatinine 2.01, BUN 42, Potassium 4.3, Sodium 136, GFR 33  05/17/2022 Creatinine 1.72, BUN 37, Potassium 4.0, Sodium 135, GFR 37.33  02/19/2022 Creatinine 1.49, BUN 35, Potassium 4.2, Sodium 139, GFR A complete set of results can be found in Results Review.   Recommendations:  Recommendation to limit salt intake to 2000 mg daily and fluid intake to 64 oz daily.  Encouraged to call if experiencing any fluid symptoms.    Follow-up plan: ICM clinic phone appointment on 08/30/2022 to recheck fluid levels.   91 day device clinic remote transmission 09/22/2022.      EP/Cardiology Office Visits:  09/28/2022 with Dr Aundra Dubin.   12/07/2022 with Dr Lovena Le.  Recall 10/10/2022 with Dr Tamala Julian.     Copy of ICM check sent to Dr. Lovena Le.     3 month ICM  trend: 08/17/2022.    12-14 Month ICM trend:     [name], RN 08/17/2022 10:58 AM

## 2022-08-24 ENCOUNTER — Ambulatory Visit (INDEPENDENT_AMBULATORY_CARE_PROVIDER_SITE_OTHER): Payer: Medicare Other | Admitting: *Deleted

## 2022-08-24 DIAGNOSIS — I4891 Unspecified atrial fibrillation: Secondary | ICD-10-CM | POA: Diagnosis not present

## 2022-08-24 DIAGNOSIS — Z5181 Encounter for therapeutic drug level monitoring: Secondary | ICD-10-CM

## 2022-08-24 LAB — POCT INR: INR: 3.8 — AB (ref 2.0–3.0)

## 2022-08-24 NOTE — Patient Instructions (Signed)
Description   Spoke with pt and instructed to not take any warfarin today then continue taking warfarin 1/2 tablet daily except for 1 tablet on Mondays and Fridays. Recheck INR in 2 weeks.  Coumadin Clinic [phone].  AMIO DOSE INCREASED TO '400MG'$  ON SAT & SUN ALL OTHER DAYS STAY AT '200MG'$ .   Pt's cell phone [phone] in case difficulty reaching pt on home phone.

## 2022-08-30 ENCOUNTER — Ambulatory Visit: Payer: Medicare Other | Attending: Internal Medicine

## 2022-08-30 DIAGNOSIS — I5022 Chronic systolic (congestive) heart failure: Secondary | ICD-10-CM

## 2022-08-30 DIAGNOSIS — Z95810 Presence of automatic (implantable) cardiac defibrillator: Secondary | ICD-10-CM

## 2022-08-31 ENCOUNTER — Encounter (HOSPITAL_COMMUNITY): Payer: Self-pay | Admitting: Cardiology

## 2022-09-01 NOTE — Progress Notes (Signed)
EPIC Encounter for ICM Monitoring  Patient Name: Austin [name] is a 82 y.o. male Date: 09/01/2022 Primary Care Physican: Janith Lima, MD Primary Cardiologist: [facility] Electrophysiologist: Lovena Le 09/02/2021 Weight: 194.7 lbs 12/23/2021 Weight: 194.7 lbs 02/23/2022 Weight: 195 lbs 08/02/2022 Weight: 201.6 lbs 08/10/2022 Weight: 202 lbs 09/01/2022 Weight: 202 lbs   Since 17-Aug-2022 AT/AF 38 Time in AT/AF 0.2 hr/day (0.7%) Longest AT/AF 8 minutes               Spoke with patient and heart failure questions reviewed.  Transmission results reviewed.  Pt reports feeling well at this time and voices no complaints.     Optivol Thoracic impedance suggesting fluid levels returned to normal.  Fluid index greater than normal threshold starting 07/26/2022.   Prescribed:  Furosemide 20 mg Take 2 tablets (40 mg total) by mouth every morning AND 1 tablet (20 mg total) every evening.   He self adjusts Furosemide when needed.    Labs: 07/01/2022 Creatinine 2.02, BUN 49, Potassium 4.8, Sodium 138, GFR 33 06/03/2022 Creatinine 2.05, BUN 38, Potassium 4.2, Sodium 136, GFR 32 05/24/2022 Creatinine 2.01, BUN 42, Potassium 4.3, Sodium 136, GFR 33  05/17/2022 Creatinine 1.72, BUN 37, Potassium 4.0, Sodium 135, GFR 37.33  02/19/2022 Creatinine 1.49, BUN 35, Potassium 4.2, Sodium 139, GFR A complete set of results can be found in Results Review.   Recommendations: Encouraged to call if experiencing any fluid symptoms.    Follow-up plan: ICM clinic phone appointment on 09/27/2022.   91 day device clinic remote transmission 09/22/2022.      EP/Cardiology Office Visits:  09/28/2022 with Dr Aundra Dubin.   12/07/2022 with Dr Lovena Le.  Recall 10/10/2022 with Dr Tamala Julian.     Copy of ICM check sent to Dr. Lovena Le.     3 month ICM trend: 08/30/2022.    12-14 Month ICM trend:     [name], RN 09/01/2022 10:49 AM

## 2022-09-03 ENCOUNTER — Encounter: Payer: Self-pay | Admitting: Cardiology

## 2022-09-04 ENCOUNTER — Other Ambulatory Visit: Payer: Self-pay | Admitting: Internal Medicine

## 2022-09-07 ENCOUNTER — Ambulatory Visit (INDEPENDENT_AMBULATORY_CARE_PROVIDER_SITE_OTHER): Payer: Medicare Other

## 2022-09-07 DIAGNOSIS — Z5181 Encounter for therapeutic drug level monitoring: Secondary | ICD-10-CM

## 2022-09-07 DIAGNOSIS — I4891 Unspecified atrial fibrillation: Secondary | ICD-10-CM

## 2022-09-07 LAB — POCT INR: INR: 3.3 — AB (ref 2.0–3.0)

## 2022-09-07 NOTE — Patient Instructions (Signed)
Description   Spoke with pt and instructed to continue taking warfarin 1/2 tablet daily except for 1 tablet on Mondays and Fridays.  Recheck INR in 2 weeks.  Coumadin Clinic [phone].  AMIO DOSE INCREASED TO 400MG ON SAT & SUN ALL OTHER DAYS STAY AT 200MG.   Pt's cell phone [phone] in case difficulty reaching pt on home phone.

## 2022-09-14 ENCOUNTER — Other Ambulatory Visit: Payer: Self-pay | Admitting: Internal Medicine

## 2022-09-21 ENCOUNTER — Ambulatory Visit (INDEPENDENT_AMBULATORY_CARE_PROVIDER_SITE_OTHER): Payer: Medicare Other

## 2022-09-21 DIAGNOSIS — Z5181 Encounter for therapeutic drug level monitoring: Secondary | ICD-10-CM

## 2022-09-21 DIAGNOSIS — I4891 Unspecified atrial fibrillation: Secondary | ICD-10-CM | POA: Diagnosis not present

## 2022-09-21 DIAGNOSIS — Z952 Presence of prosthetic heart valve: Secondary | ICD-10-CM | POA: Diagnosis not present

## 2022-09-21 LAB — POCT INR: INR: 3.9 — AB (ref 2.0–3.0)

## 2022-09-21 NOTE — Patient Instructions (Signed)
Description   Spoke with pt and instructed to skip today's dosage of Warfarin, then continue taking warfarin 1/2 tablet daily except for 1 tablet on Mondays and Fridays.  Recheck INR in 2 weeks.  Coumadin Clinic [phone].  AMIO DOSE INCREASED TO '400MG'$  ON SAT & SUN ALL OTHER DAYS STAY AT '200MG'$ .   Pt's cell phone [phone] in case difficulty reaching pt on home phone.

## 2022-09-22 ENCOUNTER — Ambulatory Visit: Payer: Medicare Other

## 2022-09-22 DIAGNOSIS — I442 Atrioventricular block, complete: Secondary | ICD-10-CM | POA: Diagnosis not present

## 2022-09-23 LAB — CUP PACEART REMOTE DEVICE CHECK
Battery Remaining Longevity: 24 mo
Battery Voltage: 2.93 V
Brady Statistic AP VP Percent: 75.13 %
Brady Statistic AP VS Percent: 0.67 %
Brady Statistic AS VP Percent: 23.21 %
Brady Statistic AS VS Percent: 0.99 %
Brady Statistic RA Percent Paced: 65.45 %
Brady Statistic RV Percent Paced: 97.35 %
Date Time Interrogation Session: 20240228022824
HighPow Impedance: 70 Ohm
Implantable Lead Connection Status: 753985
Implantable Lead Connection Status: 753985
Implantable Lead Implant Date: 20130110
Implantable Lead Implant Date: 20130110
Implantable Lead Location: 753859
Implantable Lead Location: 753860
Implantable Lead Model: 5076
Implantable Lead Model: 6935
Implantable Pulse Generator Implant Date: 20181010
Lead Channel Impedance Value: 304 Ohm
Lead Channel Impedance Value: 361 Ohm
Lead Channel Impedance Value: 399 Ohm
Lead Channel Pacing Threshold Amplitude: 1 V
Lead Channel Pacing Threshold Pulse Width: 0.4 ms
Lead Channel Sensing Intrinsic Amplitude: 0.5 mV
Lead Channel Sensing Intrinsic Amplitude: 0.5 mV
Lead Channel Sensing Intrinsic Amplitude: 12.75 mV
Lead Channel Sensing Intrinsic Amplitude: 12.75 mV
Lead Channel Setting Pacing Amplitude: 2.5 V
Lead Channel Setting Pacing Amplitude: 3.5 V
Lead Channel Setting Pacing Pulse Width: 0.4 ms
Lead Channel Setting Sensing Sensitivity: 0.3 mV
Zone Setting Status: 755011

## 2022-09-24 DIAGNOSIS — N189 Chronic kidney disease, unspecified: Secondary | ICD-10-CM

## 2022-09-24 HISTORY — DX: Chronic kidney disease, unspecified: N18.9

## 2022-09-27 ENCOUNTER — Ambulatory Visit (INDEPENDENT_AMBULATORY_CARE_PROVIDER_SITE_OTHER): Payer: Medicare Other | Admitting: Internal Medicine

## 2022-09-27 ENCOUNTER — Ambulatory Visit: Payer: Medicare Other | Attending: Internal Medicine

## 2022-09-27 VITALS — BP 112/68 | HR 66 | Temp 97.8°F | Ht 73.0 in | Wt 208.0 lb

## 2022-09-27 DIAGNOSIS — N1832 Chronic kidney disease, stage 3b: Secondary | ICD-10-CM

## 2022-09-27 DIAGNOSIS — Z95810 Presence of automatic (implantable) cardiac defibrillator: Secondary | ICD-10-CM

## 2022-09-27 DIAGNOSIS — E032 Hypothyroidism due to medicaments and other exogenous substances: Secondary | ICD-10-CM | POA: Diagnosis not present

## 2022-09-27 DIAGNOSIS — D538 Other specified nutritional anemias: Secondary | ICD-10-CM | POA: Diagnosis not present

## 2022-09-27 DIAGNOSIS — L98491 Non-pressure chronic ulcer of skin of other sites limited to breakdown of skin: Secondary | ICD-10-CM

## 2022-09-27 DIAGNOSIS — I5022 Chronic systolic (congestive) heart failure: Secondary | ICD-10-CM

## 2022-09-27 DIAGNOSIS — G4733 Obstructive sleep apnea (adult) (pediatric): Secondary | ICD-10-CM | POA: Diagnosis not present

## 2022-09-27 DIAGNOSIS — I428 Other cardiomyopathies: Secondary | ICD-10-CM

## 2022-09-27 LAB — CBC WITH DIFFERENTIAL/PLATELET
Basophils Absolute: 0.1 10*3/uL (ref 0.0–0.1)
Basophils Relative: 1.3 % (ref 0.0–3.0)
Eosinophils Absolute: 0.1 10*3/uL (ref 0.0–0.7)
Eosinophils Relative: 1.6 % (ref 0.0–5.0)
HCT: 37.5 % — ABNORMAL LOW (ref 39.0–52.0)
Hemoglobin: 12.4 g/dL — ABNORMAL LOW (ref 13.0–17.0)
Lymphocytes Relative: 6.3 % — ABNORMAL LOW (ref 12.0–46.0)
Lymphs Abs: 0.5 10*3/uL — ABNORMAL LOW (ref 0.7–4.0)
MCHC: 33 g/dL (ref 30.0–36.0)
MCV: 91.3 fl (ref 78.0–100.0)
Monocytes Absolute: 1.1 10*3/uL — ABNORMAL HIGH (ref 0.1–1.0)
Monocytes Relative: 12.9 % — ABNORMAL HIGH (ref 3.0–12.0)
Neutro Abs: 6.4 10*3/uL (ref 1.4–7.7)
Neutrophils Relative %: 77.9 % — ABNORMAL HIGH (ref 43.0–77.0)
Platelets: 121 10*3/uL — ABNORMAL LOW (ref 150.0–400.0)
RBC: 4.1 Mil/uL — ABNORMAL LOW (ref 4.22–5.81)
RDW: 16.2 % — ABNORMAL HIGH (ref 11.5–15.5)
WBC: 8.2 10*3/uL (ref 4.0–10.5)

## 2022-09-27 LAB — BASIC METABOLIC PANEL
BUN: 53 mg/dL — ABNORMAL HIGH (ref 6–23)
CO2: 31 mEq/L (ref 19–32)
Calcium: 9.3 mg/dL (ref 8.4–10.5)
Chloride: 100 mEq/L (ref 96–112)
Creatinine, Ser: 2.26 mg/dL — ABNORMAL HIGH (ref 0.40–1.50)
GFR: 26.83 mL/min — ABNORMAL LOW (ref >60.00)
Glucose, Bld: 95 mg/dL (ref 70–99)
Potassium: 4.9 mEq/L (ref 3.5–5.1)
Sodium: 138 mEq/L (ref 135–145)

## 2022-09-27 LAB — TSH: TSH: 10.93 u[IU]/mL — ABNORMAL HIGH (ref 0.35–5.50)

## 2022-09-27 NOTE — Progress Notes (Signed)
Subjective:  Patient ID: Austin [name], male    DOB: 1940/11/28  Age: 82 y.o. MRN: [id number]  CC: Atrial Fibrillation and Hypothyroidism   HPI Austin [name] presents for f/up ---  He complains of fatigue, dizziness, lightheadedness, weakness, near syncope, shortness of breath, and lower extremity edema.  Outpatient Medications Prior to Visit  Medication Sig Dispense Refill   acetaminophen (TYLENOL) 500 MG tablet Take 1,000 mg by mouth every 6 (six) hours as needed (pain).     amiodarone (PACERONE) 200 MG tablet TAKE 1 TABLET BY MOUTH EVERY DAY 90 tablet 2   amoxicillin (AMOXIL) 500 MG capsule TAKE 4 CAPSULES BY MOUTH 30 MINUTES PRIOR TO DENTAL WORK 4 capsule 2   melatonin 5 MG TABS 5 mg at bedtime as needed.     metoprolol succinate (TOPROL-XL) 25 MG 24 hr tablet Take 1 tablet (25 mg total) by mouth daily. 90 tablet 3   nitroGLYCERIN (NITROSTAT) 0.4 MG SL tablet Place 1 tablet (0.4 mg total) under the tongue every 5 (five) minutes x 3 doses as needed for chest pain. 25 tablet 2   quiniDINE gluconate 324 MG CR tablet Take 1 tablet (324 mg total) by mouth 2 (two) times daily. 60 tablet 11   spironolactone (ALDACTONE) 25 MG tablet Take 0.5 tablets (12.5 mg total) by mouth daily. 90 tablet 3   valsartan (DIOVAN) 40 MG tablet Take 1 tablet (40 mg total) by mouth daily. 30 tablet 11   Vericiguat (VERQUVO) 10 MG TABS Take 10 mg by mouth daily. 30 tablet 11   warfarin (COUMADIN) 5 MG tablet TAKE AS DIRECTED BY THE COUMADIN CLINIC 80 tablet 1   zinc gluconate 50 MG tablet Take 1 tablet (50 mg total) by mouth daily. 90 tablet 1   acetaminophen-codeine (TYLENOL #3) 300-30 MG tablet Take 1 tablet by mouth every 6 (six) hours as needed.     furosemide (LASIX) 20 MG tablet Take 2 tablets (40 mg total) by mouth in the morning AND 1 tablet (20 mg total) every evening. (Patient not taking: Reported on 09/28/2022) 180 tablet 2   levothyroxine (SYNTHROID) 75 MCG tablet Take 1 tablet (75 mcg  total) by mouth daily before breakfast. 90 tablet 3   Multiple Vitamin (MULTI VITAMIN) TABS 1 tablet Orally Once a day for 30 day(s)     Multiple Vitamin (MULTIVITAMIN WITH MINERALS) TABS tablet Take 1 tablet by mouth in the morning.     No facility-administered medications prior to visit.    ROS Review of Systems  Constitutional:  Positive for fatigue. Negative for appetite change, chills, diaphoresis, fever and unexpected weight change.  HENT: Negative.    Respiratory:  Positive for apnea and shortness of breath. Negative for cough, chest tightness and wheezing.   Cardiovascular:  Positive for leg swelling. Negative for chest pain and palpitations.  Gastrointestinal:  Negative for abdominal pain, constipation, diarrhea, nausea and vomiting.  Genitourinary:  Negative for difficulty urinating.  Musculoskeletal:  Positive for back pain. Negative for arthralgias.  Skin: Negative.  Negative for color change and pallor.  Neurological:  Positive for dizziness, weakness, light-headedness and numbness.  Hematological:  Negative for adenopathy. Does not bruise/bleed easily.  Psychiatric/Behavioral: Negative.      Objective:  BP 112/68 (BP Location: Left Arm, Patient Position: Sitting, Cuff Size: Normal)   Pulse 66   Temp 97.8 F (36.6 C) (Oral)   Ht '6\' 1"'$  (1.854 m)   Wt 208 lb (94.3 kg)   SpO2  96%   BMI 27.44 kg/m   BP Readings from Last 3 Encounters:  09/28/22 108/78  09/27/22 112/68  07/05/22 110/78    Wt Readings from Last 3 Encounters:  09/28/22 206 lb (93.4 kg)  09/27/22 208 lb (94.3 kg)  07/05/22 200 lb (90.7 kg)    Physical Exam Vitals reviewed.  Constitutional:      General: He is not in acute distress.    Appearance: He is ill-appearing. He is not toxic-appearing or diaphoretic.  HENT:     Nose: Nose normal.     Mouth/Throat:     Mouth: Mucous membranes are moist.  Eyes:     General: No scleral icterus.    Conjunctiva/sclera: Conjunctivae normal.   Cardiovascular:     Rate and Rhythm: Normal rate and regular rhythm.     Heart sounds: Murmur heard.     Systolic murmur is present with a grade of 2/6.     No diastolic murmur is present.     No gallop.     Comments: +++ click Pulmonary:     Effort: Pulmonary effort is normal.     Breath sounds: No stridor. No wheezing, rhonchi or rales.  Abdominal:     General: Abdomen is flat.     Palpations: There is no mass.     Tenderness: There is no abdominal tenderness. There is no guarding.     Hernia: No hernia is present.  Musculoskeletal:     Cervical back: Neck supple.     Right lower leg: 1+ Pitting Edema present.     Left lower leg: 1+ Pitting Edema present.  Lymphadenopathy:     Cervical: No cervical adenopathy.  Neurological:     General: No focal deficit present.     Mental Status: He is alert. Mental status is at baseline.     Lab Results  Component Value Date   WBC 8.2 09/27/2022   HGB 12.4 (L) 09/27/2022   HCT 37.5 (L) 09/27/2022   PLT 121.0 (L) 09/27/2022   GLUCOSE 95 09/27/2022   CHOL 158 08/23/2015   TRIG 130 08/23/2015   HDL 50 08/23/2015   LDLCALC 82 08/23/2015   ALT 24 05/24/2022   AST 30 05/24/2022   NA 138 09/27/2022   K 4.9 09/27/2022   CL 100 09/27/2022   CREATININE 2.26 (H) 09/27/2022   BUN 53 (H) 09/27/2022   CO2 31 09/27/2022   TSH 10.93 (H) 09/27/2022   INR 3.9 (A) 09/21/2022    DG Chest 2 View  Result Date: 07/05/2022 CLINICAL DATA:  Dyspnea. EXAM: CHEST - 2 VIEW COMPARISON:  June 19, 2020 FINDINGS: Multiple sternal wires are noted. A multi lead AICD is in place. An artificial mitral valve is noted. The heart size and mediastinal contours are within normal limits. Low lung volumes are seen with mild, stable elevation of the right hemidiaphragm. Very mild atelectasis is seen within the left lung base. There is no evidence of an acute infiltrate, pleural effusion or pneumothorax. Degenerative changes seen involving the bilateral shoulders.  IMPRESSION: 1. Evidence of prior median and artificial mitral valve placement. 2. Very mild left basilar atelectasis. Electronically Signed   By: Virgina Norfolk M.D.   On: 07/05/2022 23:21    Assessment & Plan:   Veeraj was seen today for atrial fibrillation and hypothyroidism.  Diagnoses and all orders for this visit:  Stage 3b chronic kidney disease ([place])- His renal function is stable.  Will avoid nephrotoxic agents. -  Basic metabolic panel; Future -     CBC with Differential/Platelet; Future -     CBC with Differential/Platelet -     Basic metabolic panel  Anemia due to zinc deficiency- Will continue the zinc supplement. -     Zinc; Future -     CBC with Differential/Platelet; Future -     CBC with Differential/Platelet -     Zinc  Hypothyroidism due to medication- His TSH is nearly 11.  Will increase the T4 dosage. -     TSH; Future -     CBC with Differential/Platelet; Future -     CBC with Differential/Platelet -     TSH -     levothyroxine (SYNTHROID) 88 MCG tablet; Take 1 tablet (88 mcg total) by mouth daily.  Callous ulcer, limited to breakdown of skin ([place]) -     Ambulatory referral to Podiatry  OSA on CPAP -     Ambulatory referral to Sleep Studies  Nonischemic cardiomyopathy ([place])- He appears well compensated.   I have discontinued Keno L. Degregorio's levothyroxine, Multi Vitamin, and acetaminophen-codeine. I am also having him start on levothyroxine. Additionally, I am having him maintain his multivitamin with minerals, acetaminophen, nitroGLYCERIN, amoxicillin, spironolactone, quiniDINE gluconate, metoprolol succinate, Verquvo, zinc gluconate, valsartan, warfarin, amiodarone, and melatonin.  Meds ordered this encounter  Medications   levothyroxine (SYNTHROID) 88 MCG tablet    Sig: Take 1 tablet (88 mcg total) by mouth daily.    Dispense:  90 tablet    Refill:  0     Follow-up: No follow-ups on file.  Scarlette Calico, MD

## 2022-09-28 ENCOUNTER — Ambulatory Visit (HOSPITAL_BASED_OUTPATIENT_CLINIC_OR_DEPARTMENT_OTHER)
Admission: RE | Admit: 2022-09-28 | Discharge: 2022-09-28 | Disposition: A | Discharge status: Home / Self Care | Payer: Medicare Other | Source: Ambulatory Visit | Attending: Cardiology | Admitting: Cardiology

## 2022-09-28 ENCOUNTER — Ambulatory Visit (HOSPITAL_COMMUNITY)
Admission: RE | Admit: 2022-09-28 | Discharge: 2022-09-28 | Disposition: A | Discharge status: Home / Self Care | Payer: Medicare Other | Source: Ambulatory Visit | Attending: Cardiology | Admitting: Cardiology

## 2022-09-28 ENCOUNTER — Encounter (HOSPITAL_COMMUNITY): Payer: Self-pay | Admitting: Cardiology

## 2022-09-28 VITALS — BP 108/78 | HR 86 | Wt 206.0 lb

## 2022-09-28 DIAGNOSIS — Z7901 Long term (current) use of anticoagulants: Secondary | ICD-10-CM | POA: Insufficient documentation

## 2022-09-28 DIAGNOSIS — Z79890 Hormone replacement therapy (postmenopausal): Secondary | ICD-10-CM | POA: Insufficient documentation

## 2022-09-28 DIAGNOSIS — I5022 Chronic systolic (congestive) heart failure: Secondary | ICD-10-CM

## 2022-09-28 DIAGNOSIS — Z95810 Presence of automatic (implantable) cardiac defibrillator: Secondary | ICD-10-CM | POA: Insufficient documentation

## 2022-09-28 DIAGNOSIS — I4892 Unspecified atrial flutter: Secondary | ICD-10-CM | POA: Diagnosis not present

## 2022-09-28 DIAGNOSIS — E8770 Fluid overload, unspecified: Secondary | ICD-10-CM | POA: Insufficient documentation

## 2022-09-28 DIAGNOSIS — Z8249 Family history of ischemic heart disease and other diseases of the circulatory system: Secondary | ICD-10-CM | POA: Insufficient documentation

## 2022-09-28 DIAGNOSIS — Z79899 Other long term (current) drug therapy: Secondary | ICD-10-CM | POA: Insufficient documentation

## 2022-09-28 DIAGNOSIS — Z950 Presence of cardiac pacemaker: Secondary | ICD-10-CM | POA: Diagnosis not present

## 2022-09-28 DIAGNOSIS — I472 Ventricular tachycardia, unspecified: Secondary | ICD-10-CM | POA: Diagnosis not present

## 2022-09-28 DIAGNOSIS — R0602 Shortness of breath: Secondary | ICD-10-CM | POA: Diagnosis not present

## 2022-09-28 DIAGNOSIS — Z952 Presence of prosthetic heart valve: Secondary | ICD-10-CM | POA: Insufficient documentation

## 2022-09-28 DIAGNOSIS — E039 Hypothyroidism, unspecified: Secondary | ICD-10-CM | POA: Insufficient documentation

## 2022-09-28 DIAGNOSIS — I480 Paroxysmal atrial fibrillation: Secondary | ICD-10-CM | POA: Insufficient documentation

## 2022-09-28 DIAGNOSIS — I428 Other cardiomyopathies: Secondary | ICD-10-CM | POA: Diagnosis not present

## 2022-09-28 DIAGNOSIS — I442 Atrioventricular block, complete: Secondary | ICD-10-CM | POA: Diagnosis not present

## 2022-09-28 LAB — ECHOCARDIOGRAM COMPLETE
Area-P 1/2: 3.17 cm2
Calc EF: 22.5 %
MV VTI: 1.49 cm2
S' Lateral: 4.9 cm
Single Plane A2C EF: 20.5 %
Single Plane A4C EF: 27.7 %

## 2022-09-28 MED ORDER — LEVOTHYROXINE SODIUM 88 MCG PO TABS: 88.0000 ug | ORAL_TABLET | Freq: Every day | ORAL | 0 refills | Status: DC

## 2022-09-28 MED ORDER — FUROSEMIDE 20 MG PO TABS: 60.0000 mg | ORAL_TABLET | Freq: Two times a day (BID) | ORAL | 6 refills | Status: DC

## 2022-09-28 NOTE — Progress Notes (Signed)
Echocardiogram 2D Echocardiogram has been performed.  Austin [name] 09/28/2022, 10:59 AM

## 2022-09-28 NOTE — Progress Notes (Signed)
PCP: Janith Lima, MD Cardiology: Dr. Tamala Julian HF Cardiology: Dr. Aundra Dubin EP: Dr. Lovena Le  82 y.o. with history of mechanical mitral valve s/p MV endocarditis, VT, nonischemic cardiomyopathy, complete heart block was referred by Dr. Tamala Julian for evaluation of CHF.  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 He subsequently developed a presumed nonischemic cardiomyopathy.  This was complicated by VT, he had a VT ablation in 2017 and had a Medtronic CRT-D device implanted. This had to be removed due to infection in 11/12 and was reimplanted in 1/13. Last echo in 5/22 showed EF 30% with normal RV, mechanical MV stable with mean gradient 3 mmHg.  He has been in atrial fibrillation persistently since late 2021.  He is on amiodarone and has been cardioverted in the past. His LV lead is not functional.  He has complete heart block and is persistently RV pacing.   Had [place] 02/26/21 RA 4, PA 32/12, PCWP 11, CO 4.6, CI 2.2. Normal filling pressures and slightly low cardiac output.   Digoxin was stopped due to elevated level.   He saw Dr. Lovena Le in 9/22 and was in slow VT in the office.  He was paced out of VT and amiodarone was increased to 200 mg daily.   He saw Dr. Lovena Le, and placement of a left bundle or LV lead was decided to be too risky a procedure.  He was referred to Dr. Oneida Alar with the hope that he could have a VT ablation and get off amiodarone, which is likely playing a role in his symptoms.     S/p VT ablation with Dr. Oneida Alar 1/23.  Echo in 4/23 showed EF 20-25% with diffuse hypokinesis and septal-lateral dyssynchrony, mildly decreased RV systolic function, mechanical MV with normal function, dilated IVC.   He was seen in the office in 7/23, noted to be in atrial flutter in the atria and slow VT in the ventricles.  VT was pace terminated by Dr. Caryl Comes and lower threshold set for ATP.  Later in 7/23, he had another VT episode and had an ICD discharge.   Quinidine was then started by Dr. Lovena Le.   Follow up 10/23, had runs of VT terminated by ATP. Stable NYHA III and volume stable by OptiVol.  Echo was done today and reviewed, EF 20-25%, mildly decreased RV systolic function, moderate TR, IVC dilated, stable mechanical mitral valve with mean gradient 4 mmHg.   He returns today for followup of CHF.  Weight is up 6 lbs.  He notes occasional lightheadedness with episodes of low BP, but these episodes do seem to be infrequent.  No presyncope/syncope.  Generally, SBP runs in 100s.  He is short of breath after walking about 200 feet.  This is stable.  Goes to gym 2-3 times/week.  Keeps busy caring for his wife who has Parkinsons.  No chest pain.  No orthopnea/PND.    Medtronic device interrogation: Stable thoracic impedance, high percentage for RV pacing.   ECG (personally reviewed):  somewhat difficult but suspect A-V dual chamber pacing.   Labs (7/22): K 4.6, creatinine 1.3 Labs (8/22): K 4.4, creatinine 1.23 Labs (10/22): K 3.9, creatinine 1.36 Labs (12/22): K 4.2, creatinine 1.39 Labs (1/23): K 4.3, creatinine 1.15 Labs (2/23): TSH 10, K 4.1, creatinine 1.54 Labs (6/23): BNP 798, K 4.1, creatinine 1.3 Labs (7/23): BNP 1244, LFTs normal, K 4.2, creatinine 1.49 Labs (10/23): K 4, creatinine 1.72, TSH 5.7, transferrin saturation 28%, hgb 12.8, plts 128, LFTs normal Labs (  3/24): K 4.9, creatinine 2.26, TSH 10.9, hgb 12.4  PMH: 1. 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 2. H/o VT: VT ablation in 2017.  - Redo VT ablation in 1/23 with Dr. Oneida Alar - Claudia Desanctis termination of VT in office in 7/23.  - ICD shock 7/23, started quinidine.  3. Atrial fibrillation/atypical flutter: Persistent since 2021.  Failed amiodarone.  4. PAD: Tibial occlusive disease.  5. 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  6. Hypothyroidism.  7. Chronic systolic CHF: Presumed nonischemic cardiomyopathy.  Medtronic CRT-D device, LV lead is  nonfunctional.  - Echo (2012): EF 45% - Echo (1/17): EF 30-35% - Echo (6/21): EF 15-20% - Echo (11/21): EF 30-35%  - Echo (5/22): EF 30%, normal RV, mechanical mitral valve mean gradient 3 mmHg.  - RHC (8/22): RA mean 4, RV 33/4,PA 32/12 mean 21, PCWP mean 11, Oxygen saturations:PA 71%, AO 98%, Cardiac Output (Fick) 4.66 Cardiac Index (Fick) 2.18 - Echo (4/23): EF 20-25% with diffuse hypokinesis and septal-lateral dyssynchrony, mildly decreased RV systolic function, mechanical MV with normal function, dilated IVC.  - Echo (3/24): EF 20-25%, mildly decreased RV systolic function, moderate TR, IVC dilated, stable mechanical mitral valve with mean gradient 4 mmHg.   Social History   Socioeconomic History   Marital status: Married    Spouse name: Not on file   Number of children: 1   Years of education: 30   Highest education level: Master's degree (e.g., MA, MS, MEng, MEd, MSW, MBA)  Occupational History   Occupation: retired from [employer], manufacturing systems  Tobacco Use   Smoking status: Never   Smokeless tobacco: Never  Vaping Use   Vaping Use: Never used  Substance and Sexual Activity   Alcohol use: Yes    Alcohol/week: 0.0 standard drinks of alcohol    Comment: occasionally   Drug use: No   Sexual activity: Not on file  Other Topics Concern   Not on file  Social History Narrative   Lives with wife in a one story home.  Has one daughter.  Retired.  Education: Masters.   Social Determinants of Health   Financial Resource Strain: Not on file  Food Insecurity: Not on file  Transportation Needs: Not on file  Physical Activity: Not on file  Stress: Not on file  Social Connections: Not on file  Intimate Partner Violence: Not on file   Family History  Problem Relation Age of Onset   Heart disease Mother    Heart failure Mother    Heart disease Father    Heart failure Father    ROS: All systems reviewed and negative except as per HPI.   Current Outpatient Medications   Medication Sig Dispense Refill   acetaminophen (TYLENOL) 500 MG tablet Take 1,000 mg by mouth every 6 (six) hours as needed (pain).     amiodarone (PACERONE) 200 MG tablet TAKE 1 TABLET BY MOUTH EVERY DAY 90 tablet 2   amoxicillin (AMOXIL) 500 MG capsule TAKE 4 CAPSULES BY MOUTH 30 MINUTES PRIOR TO DENTAL WORK 4 capsule 2   levothyroxine (SYNTHROID) 88 MCG tablet Take 1 tablet (88 mcg total) by mouth daily. 90 tablet 0   melatonin 5 MG TABS 5 mg at bedtime as needed.     metoprolol succinate (TOPROL-XL) 25 MG 24 hr tablet Take 1 tablet (25 mg total) by mouth daily. 90 tablet 3   Multiple Vitamin (MULTIVITAMIN WITH MINERALS) TABS tablet Take 1 tablet by mouth in the morning.     nitroGLYCERIN (NITROSTAT)  0.4 MG SL tablet Place 1 tablet (0.4 mg total) under the tongue every 5 (five) minutes x 3 doses as needed for chest pain. 25 tablet 2   quiniDINE gluconate 324 MG CR tablet Take 1 tablet (324 mg total) by mouth 2 (two) times daily. 60 tablet 11   spironolactone (ALDACTONE) 25 MG tablet Take 0.5 tablets (12.5 mg total) by mouth daily. 90 tablet 3   valsartan (DIOVAN) 40 MG tablet Take 1 tablet (40 mg total) by mouth daily. 30 tablet 11   Vericiguat (VERQUVO) 10 MG TABS Take 10 mg by mouth daily. 30 tablet 11   warfarin (COUMADIN) 5 MG tablet TAKE AS DIRECTED BY THE COUMADIN CLINIC 80 tablet 1   zinc gluconate 50 MG tablet Take 1 tablet (50 mg total) by mouth daily. 90 tablet 1   furosemide (LASIX) 20 MG tablet Take 3 tablets (60 mg total) by mouth 2 (two) times daily. 200 tablet 6   No current facility-administered medications for this encounter.   BP 108/78   Pulse 86   Wt 93.4 kg (206 lb)   SpO2 92%   BMI 27.18 kg/m   Wt Readings from Last 3 Encounters:  09/28/22 93.4 kg (206 lb)  09/27/22 94.3 kg (208 lb)  07/05/22 90.7 kg (200 lb)  General: NAD Neck: JVP 8-9 cm with HJR, no thyromegaly or thyroid nodule.  Lungs: Clear to auscultation bilaterally with normal respiratory  effort. CV: Nondisplaced PMI.  Heart regular S1/S2 with mechanical S1, no S3/S4, no murmur.  1+ ankle edema.  No carotid bruit.  Normal pedal pulses.  Abdomen: Soft, nontender, no hepatosplenomegaly, no distention.  Skin: Intact without lesions or rashes.  Neurologic: Alert and oriented x 3.  Psych: Normal affect. Extremities: No clubbing or cyanosis.  HEENT: Normal.   Assessment/Plan: 1. Chronic systolic CHF: Long-standing, suspected nonischemic cardiomyopathy.  Had [place] on 02/26/21 with normal filling pressures and mildly low cardiac output.  Echo in 4/23 showed  EF 20-25% with diffuse hypokinesis and septal-lateral dyssynchrony, mildly decreased RV systolic function, mechanical MV with normal function, dilated IVC.  He has a Medtronic CRT-D device present, but the LV lead is not functional.  He has complete heart block, so has been RV paced chronically.   He appears to be atrially paced today.  NYHA class III, symptoms stable.  Weight is up and he is volume overloaded by exam and dilated IVC on echo though Optivol does not suggest volume overload.  - Increase Lasix to 60 mg bid.  BMET/BNP today with BMET in 10 days.  - Continue spironolactone 12.5 daily, will not increase due to soft BP and higher creatinine.  Take qhs.  - With orthostatic symptoms, he is now off Entresto and on valsartan 40 mg daily (continue, take qhs).   - Continue Toprol XL 25 mg qhs.  - Continue Verquvo 10 mg daily.  - Off digoxin due to persistently elevated level.  - The patient has CHB and has been chronically RV pacing as his LV lead is nonfunctional (when not in VT).  This is not ideal and creates dyssynchrony.  Remains symptomatic with limited ability to titrate meds. He has seen Dr. Lovena Le, it would be possible to place left bundle lead but procedure would be complicated.  After further discussion with Dr. Lovena Le, it was decided that risk would be too high and left bundle lead will not be attempted.   - Will need to  consider baroreceptor activation therapy.  2. Atrial fibrillation/flutter: Paroxysmal.   -  He is on amiodarone 200 mg daily.  Check LFTs today, he is on Levoxyl for hypothyroidism => TSH elevated yesterday, PCP manages.  He will need a regular eye exam.  - Continue Warfarin.  3. Mechanical mitral valve: Valve looked normal on today's echo.    - Continue warfarin, INR goal 3-3.5.  - He has not been on ASA.  4. VT: History of VT and VT ablation in 2017.  MDT ICD.  Redo VT ablation in 1/23 (Dr. Oneida Alar).  He is on amiodarone, this was increased to 200 mg daily recently with recurrent VT.  He has tolerated amiodarone poorly historically.  In 7/23, slow VT was pace-terminated in the office, then he had an ICD shock for VT later in 7/23.  He was started on quinidine after this.  Still having periodic VT treated with ATP, asymptomatic.  - Continue amiodarone 200 mg daily.  - Continue quinidine.  5. Complete heart block: Per notes, patient is pacer dependent due to CHB.  - See above, upgrade to CRT thought to be too risky.    Follow up in 1 month with APP to reassess volume, discuss BAT.   Loralie Champagne,  09/28/2022

## 2022-09-28 NOTE — Progress Notes (Signed)
EPIC Encounter for ICM Monitoring  Patient Name: NORM MARBLE is a 82 y.o. male Date: 09/28/2022 Primary Care Physican: Janith Lima, MD Primary Cardiologist: [place] Electrophysiologist: Lovena Le 09/02/2021 Weight: 194.7 lbs 12/23/2021 Weight: 194.7 lbs 02/23/2022 Weight: 195 lbs 08/02/2022 Weight: 201.6 lbs 08/10/2022 Weight: 202 lbs 09/01/2022 Weight: 202 lbs 09/28/2022 Weight: 200 lbs   Since 22-Sep-2022 Time in AT/AF 0.0 hr/day (0.0%)               Spoke with patient and heart failure questions reviewed.  Transmission results reviewed.  Pt reports feeling well at this time and voices no complaints.     Optivol Thoracic impedance suggesting normal fluid levels.     Prescribed:  Furosemide 20 mg Take 3 tablets (60 mg total) by mouth twice a day (increased 09/28/2022).   He self adjusts Furosemide when needed.    Labs: 09/27/2022 Creatinine 2.26, BUN 53, Potassium 4.9, Sodium 138, GFR 26.83 07/01/2022 Creatinine 2.02, BUN 49, Potassium 4.8, Sodium 138, GFR 33 06/03/2022 Creatinine 2.05, BUN 38, Potassium 4.2, Sodium 136, GFR 32 05/24/2022 Creatinine 2.01, BUN 42, Potassium 4.3, Sodium 136, GFR 33  05/17/2022 Creatinine 1.72, BUN 37, Potassium 4.0, Sodium 135, GFR 37.33  02/19/2022 Creatinine 1.49, BUN 35, Potassium 4.2, Sodium 139, GFR A complete set of results can be found in Results Review.   Recommendations: Encouraged to call if experiencing any fluid symptoms.    Follow-up plan: ICM clinic phone appointment on 11/01/2022.   91 day device clinic remote transmission 12/22/2022.      EP/Cardiology Office Visits:  10/29/2022 with HF clinic OV.   12/07/2022 with Dr Lovena Le.     Copy of ICM check sent to Dr. Lovena Le.      3 month ICM trend: 09/27/2022.    12-14 Month ICM trend:     [name], RN 09/28/2022 3:25 PM

## 2022-09-28 NOTE — Patient Instructions (Signed)
INCREASE Lasix to 60 mg Twice daily  Blood work in 10 days.  Your physician recommends that you schedule a follow-up appointment in: 1 month  If you have any questions or concerns before your next appointment please send [place] a message through [place] or call our office at [phone].    TO LEAVE A MESSAGE FOR THE NURSE SELECT OPTION 2, PLEASE LEAVE A MESSAGE INCLUDING: YOUR NAME DATE OF BIRTH CALL BACK NUMBER REASON FOR CALL**this is important as we prioritize the call backs  YOU WILL RECEIVE A CALL BACK THE SAME DAY AS LONG AS YOU CALL BEFORE 4:00 PM  At the [facility], you and your health needs are our priority. As part of our continuing mission to provide you with exceptional heart care, we have created designated Provider Care Teams. These Care Teams include your primary Cardiologist (physician) and Advanced Practice Providers (APPs- Physician Assistants and Nurse Practitioners) who all work together to provide you with the care you need, when you need it.   You may see any of the following providers on your designated Care Team at your next follow up: Dr Glori Bickers Dr Loralie Champagne Dr. Roxana Hires, NP Lyda Jester, [place] [facility] [place], [place] Forestine Na, NP Audry Riles, PharmD   Please be sure to bring in all your medications bottles to every appointment.    Thank you for choosing [facility]

## 2022-09-29 ENCOUNTER — Ambulatory Visit: Payer: Medicare Other | Admitting: Podiatry

## 2022-09-29 DIAGNOSIS — I739 Peripheral vascular disease, unspecified: Secondary | ICD-10-CM

## 2022-09-29 DIAGNOSIS — L97512 Non-pressure chronic ulcer of other part of right foot with fat layer exposed: Secondary | ICD-10-CM

## 2022-09-29 MED ORDER — MUPIROCIN 2 % EX OINT: 1.0000 | TOPICAL_OINTMENT | Freq: Every day | CUTANEOUS | 2 refills | Status: DC

## 2022-09-29 NOTE — Patient Instructions (Signed)
Call [phone] to schedule your vascular testing:  [facility] [address] [place], [place] [address]

## 2022-09-30 ENCOUNTER — Encounter: Payer: Self-pay | Admitting: Podiatry

## 2022-09-30 NOTE — Progress Notes (Signed)
  Subjective:  Patient ID: Austin [name], male    DOB: [birth year],  MRN: [id number]  Chief Complaint  Patient presents with   Foot Ulcer    Callus/ ulcer, right foot, limited to breakdown of skin - 5th submet    82 y.o. male presents with the above complaint. History confirmed with patient.  He said it has been there for a few weeks it is very painful it started with shoes rubbing on the side of the foot   Physical Exam: warm, good capillary refill, no trophic changes or ulcerative lesions, +1 pitting edema, he has nonpalpable DP and PT pulses, normal sensory exam, and full-thickness ulceration lateral fifth metatarsal measures 7 mm x 8 mm, granular wound bed surrounding hyperkeratosis.     Assessment:   1. Ulcer of right foot with fat layer exposed ([place])   2. PAD (peripheral artery disease) ([place])      Plan:  Patient was evaluated and treated and all questions answered.  Ulcer right foot -We discussed the etiology and factors that are a part of the wound healing process.  We also discussed the risk of infection both soft tissue and osteomyelitis from open ulceration.  Discussed the risk of limb loss if this happens or worsens. -Debridement as below. -Dressed with Iodosorb, DSD. -Continue home dressing changes daily with 4 x 4 gauze and mupirocin, Rx sent to pharmacy -We discussed offloading with a surgical shoe but he declined to do this today -Vascular testing new ABIs ordered   Procedure: Excisional Debridement of Wound Rationale: Removal of non-viable soft tissue from the wound to promote healing.  Anesthesia: none Post-Debridement Wound Measurements: Noted above Type of Debridement: Sharp Excisional Tissue Removed: Non-viable soft tissue Depth of Debridement: subcutaneous tissue. Technique: Sharp excisional debridement to bleeding, viable wound base.  Dressing: Dry, sterile, compression dressing. Disposition: Patient tolerated procedure well.    Return in about 3  weeks (around 10/20/2022) for wound care.       Return in about 3 weeks (around 10/20/2022) for wound care.

## 2022-10-01 ENCOUNTER — Encounter: Payer: Self-pay | Admitting: Internal Medicine

## 2022-10-05 ENCOUNTER — Ambulatory Visit (INDEPENDENT_AMBULATORY_CARE_PROVIDER_SITE_OTHER): Payer: Medicare Other

## 2022-10-05 DIAGNOSIS — Z5181 Encounter for therapeutic drug level monitoring: Secondary | ICD-10-CM | POA: Diagnosis not present

## 2022-10-05 DIAGNOSIS — I4891 Unspecified atrial fibrillation: Secondary | ICD-10-CM

## 2022-10-05 LAB — POCT INR: INR: 2.4 (ref 2.0–3.0)

## 2022-10-05 NOTE — Patient Instructions (Addendum)
Description   Spoke with pt and instructed to take 1 tablet today and then continue taking warfarin 1/2 tablet daily except for 1 tablet on Mondays and Fridays.  Recheck INR in 2 weeks.  Coumadin Clinic [phone].  AMIO DOSE '200MG'$  DAILY.   Pt's cell phone [phone] in case difficulty reaching pt on home phone.

## 2022-10-07 LAB — ZINC: Zinc: 61 ug/dL (ref 60–130)

## 2022-10-08 ENCOUNTER — Ambulatory Visit (HOSPITAL_COMMUNITY)
Admission: RE | Admit: 2022-10-08 | Discharge: 2022-10-08 | Disposition: A | Discharge status: Home / Self Care | Payer: Medicare Other | Source: Ambulatory Visit | Attending: Cardiology | Admitting: Cardiology

## 2022-10-08 DIAGNOSIS — I5022 Chronic systolic (congestive) heart failure: Secondary | ICD-10-CM | POA: Diagnosis not present

## 2022-10-08 LAB — BASIC METABOLIC PANEL
Anion gap: 10 (ref 5–15)
BUN: 47 mg/dL — ABNORMAL HIGH (ref 8–23)
CO2: 28 mmol/L (ref 22–32)
Calcium: 8.7 mg/dL — ABNORMAL LOW (ref 8.9–10.3)
Chloride: 97 mmol/L — ABNORMAL LOW (ref 98–111)
Creatinine, Ser: 2.48 mg/dL — ABNORMAL HIGH (ref 0.61–1.24)
GFR, Estimated: 26 mL/min — ABNORMAL LOW (ref >60)
Glucose, Bld: 102 mg/dL — ABNORMAL HIGH (ref 70–99)
Potassium: 3.7 mmol/L (ref 3.5–5.1)
Sodium: 135 mmol/L (ref 135–145)

## 2022-10-11 ENCOUNTER — Encounter (HOSPITAL_COMMUNITY): Payer: Self-pay

## 2022-10-11 ENCOUNTER — Telehealth (HOSPITAL_COMMUNITY): Payer: Self-pay

## 2022-10-11 ENCOUNTER — Other Ambulatory Visit (HOSPITAL_COMMUNITY): Payer: Self-pay

## 2022-10-11 DIAGNOSIS — I5022 Chronic systolic (congestive) heart failure: Secondary | ICD-10-CM

## 2022-10-11 NOTE — Telephone Encounter (Signed)
Error

## 2022-10-19 ENCOUNTER — Ambulatory Visit (INDEPENDENT_AMBULATORY_CARE_PROVIDER_SITE_OTHER): Payer: Medicare Other

## 2022-10-19 ENCOUNTER — Ambulatory Visit (HOSPITAL_COMMUNITY)
Admission: RE | Admit: 2022-10-19 | Discharge: 2022-10-19 | Disposition: A | Discharge status: Home / Self Care | Payer: Medicare Other | Source: Ambulatory Visit | Attending: Internal Medicine | Admitting: Internal Medicine

## 2022-10-19 DIAGNOSIS — I4891 Unspecified atrial fibrillation: Secondary | ICD-10-CM

## 2022-10-19 DIAGNOSIS — Z5181 Encounter for therapeutic drug level monitoring: Secondary | ICD-10-CM

## 2022-10-19 DIAGNOSIS — I5022 Chronic systolic (congestive) heart failure: Secondary | ICD-10-CM | POA: Insufficient documentation

## 2022-10-19 LAB — BASIC METABOLIC PANEL
Anion gap: 8 (ref 5–15)
BUN: 47 mg/dL — ABNORMAL HIGH (ref 8–23)
CO2: 27 mmol/L (ref 22–32)
Calcium: 8.8 mg/dL — ABNORMAL LOW (ref 8.9–10.3)
Chloride: 101 mmol/L (ref 98–111)
Creatinine, Ser: 2.08 mg/dL — ABNORMAL HIGH (ref 0.61–1.24)
GFR, Estimated: 32 mL/min — ABNORMAL LOW (ref >60)
Glucose, Bld: 106 mg/dL — ABNORMAL HIGH (ref 70–99)
Potassium: 4.8 mmol/L (ref 3.5–5.1)
Sodium: 136 mmol/L (ref 135–145)

## 2022-10-19 LAB — POCT INR: INR: 1.8 — AB (ref 2.0–3.0)

## 2022-10-19 NOTE — Patient Instructions (Signed)
Description   Spoke with pt and instructed to take 1 tablet today and then start taking warfarin 1/2 tablet daily except for 1 tablet on Mondays, Wednesdays and Fridays.  Recheck INR in 1 week (usually 2 week recheck).  Coumadin Clinic [phone].  AMIO DOSE 200MG  DAILY.   Pt's cell phone [phone] in case difficulty reaching pt on home phone.

## 2022-10-20 ENCOUNTER — Ambulatory Visit: Payer: Medicare Other | Admitting: Neurology

## 2022-10-20 ENCOUNTER — Encounter: Payer: Self-pay | Admitting: Neurology

## 2022-10-20 VITALS — BP 103/67 | HR 65 | Ht 73.0 in | Wt 210.6 lb

## 2022-10-20 DIAGNOSIS — R351 Nocturia: Secondary | ICD-10-CM | POA: Diagnosis not present

## 2022-10-20 DIAGNOSIS — G4719 Other hypersomnia: Secondary | ICD-10-CM | POA: Diagnosis not present

## 2022-10-20 DIAGNOSIS — I428 Other cardiomyopathies: Secondary | ICD-10-CM | POA: Diagnosis not present

## 2022-10-20 DIAGNOSIS — G4733 Obstructive sleep apnea (adult) (pediatric): Secondary | ICD-10-CM

## 2022-10-20 NOTE — Patient Instructions (Addendum)
It was nice to meet you today.  As discussed, we will proceed with a home sleep test (HST) to re-establish your sleep apnea diagnosis and to get you a new machine. Our sleep lab staff will reach out to you to arrange for pickup and for tutorial of your test equipment - you will do the test at home that night and bring the test sensors back for data analysis the next day or whenever you are scheduled for drop off of your test equipment. I will write for a new machine after your HST confirms your obstructive sleep apnea diagnosis.   Please remember, you will not use your CPAP the night of your testing.  This is so we get diagnostic data, we do not need treatment data.    Most people who are very consistent with their AutoPap or CPAP use do not sleep very well without the machine - this is understandable, but as long as we have just sleep data that confirms your sleep apnea diagnosis we should be good.   We will schedule a follow-up appointment after set up with your new machine, typically within 31 to 89 days post treatment start. You will need to show compliance with usage and fulfill a minimum usage percentage (this is an insurance requirement).  After you have done your home sleep test, you can resume using your current machine until you get a new one.   Please continue using your CPAP regularly. While your insurance requires that you use CPAP at least 4 hours each night on 70% of the nights, I recommend, that you not skip any nights and use it throughout the night if you can. Getting used to CPAP and staying with the treatment long term does take time and patience and discipline. Untreated obstructive sleep apnea when it is moderate to severe can have an adverse impact on cardiovascular health and raise her risk for heart disease, arrhythmias, hypertension, congestive heart failure, stroke and diabetes. Untreated obstructive sleep apnea causes sleep disruption, nonrestorative sleep, and sleep  deprivation. This can have an impact on your day to day functioning and cause daytime sleepiness and impairment of cognitive function, memory loss, mood disturbance, and problems focussing. Using CPAP regularly can improve these symptoms.

## 2022-10-20 NOTE — Progress Notes (Addendum)
Subjective:    Patient ID: Austin [name] is a 82 y.o. male.  HPI    Austin Age, MD, PhD [facility] [address], [address] [place], [place] [address]  Dear Dr. Ronnald [name],  I saw your patient, Austin [name], upon your kind request in my sleep clinic today for initial consultation of his sleep disorder, in particular, evaluation of his prior diagnosis of obstructive sleep apnea.  The patient is unaccompanied today.  As you know, Austin [name] is a 82 year old male with an underlying complex medical history of atrial fibrillation, status post AICD placement, nonischemic cardiomyopathy, status post mitral valve replacement, syncope, history of V. tach, history of intraspinal abscess, hearing loss, edema, hypothyroidism, anemia, and overweight state, who reports that his CPAP machine is not working properly, sometimes it turns off randomly.  He has had this machine about 7 years, as he recalls he has a [employer] air sense 10 machine and his DME company is [employer] formerly known [facility].  He uses a nasal interface, under the nose cushion from [employer].  He brought the mask but not the machine, a download is not possible today.  We will get in touch with his DME provider to see if we can get data from his machine.  It sounds like he is either on a CPAP or AutoPap.  He was previously diagnosed with obstructive sleep apnea and placed on PAP therapy, original diagnosis in or around 2007.  He had an in lab study then and also had a home sleep test a few years ago, last sleep specialist was Dr. Maxwell [name] but he has not seen him in several years he reports.  Prior sleep study results are not available for my review today.  His Epworth sleepiness score is 13 out of 24, fatigue severity score is 16 out of 63.  I reviewed your office note from 09/27/2022.  He is not aware of any family history of sleep apnea.  His bedtime is generally around 10, he has a variable sleep  schedule.  He is a non-smoker and drinks limited caffeine in the form of coffee, 1 cup in the morning.  He drinks no alcohol.  He has nocturia about 1-2+ per average night but sometimes a lot more frequently depending on how much Lasix he is taking.  He has had weight fluctuation particularly tied in with his edema fluctuation.  He denies recurrent morning headaches.  He had a tonsillectomy at [name] 29.  He reports compliance with his CPAP machine.  He believes he needs a new machine.  Addendum, 10/20/2022, 5:32 PM: I received his recent AutoPap compliance data for the past month.  He has been compliant with treatment with percent use days greater than 4 hours at 63%, indicating suboptimal compliance for more than 4 hours.  Average usage of 4 hours and 31 minutes, residual AHI elevated at 17.3/h with primarily central events with a central index of 12.1/h, leak on the higher side with the 95th percentile at 23.2 L/min, 95th percentile of pressure at 10.5 cm with a range of 4 to 16 cm without EPR.  In the past 90 days he has had similar compliance, average AHI elevated in the moderate range at 24.5/h, primarily due to central events with a central index of 17.9/h.  It looks like patient is suboptimally treated with his AutoPap currently.  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  As discussed we will proceed with home sleep testing and take it from there.   His Past Medical History Is Significant For: Past Medical History:  Diagnosis Date   AICD (automatic cardioverter/defibrillator) present    Atrial fibrillation ([place])    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    cx by infection, explantation11/12 & reimplant 1/13   Conductive hearing loss    Intraspinal abscess    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   Nonischemic cardiomyopathy ([place])     Psychosexual dysfunction with inhibited sexual excitement    S/P mitral valve replacement    Syncope and collapse    Unspecified sleep apnea    last sleep study 11/07   Ventricular tachycardia ([place])    VT (ventricular tachycardia) ([place])     His Past Surgical History Is Significant For: Past Surgical History:  Procedure Laterality Date   BIV ICD GENERATOR CHANGEOUT N/A 05/04/2017   Procedure: BiV ICD Generator Changeout;  Surgeon: Evans Lance, MD;  Location: [place] [facility];  Service: Cardiovascular;  Laterality: N/A;   CARDIAC CATHETERIZATION  04/24/2002   CARDIAC VALVE REPLACEMENT     CARDIOVERSION  03/09/2012   Procedure: CARDIOVERSION;  Surgeon: Evans Lance, MD;  Location: [place];  Service: Cardiovascular;  Laterality: N/A;   CARDIOVERSION N/A 11/22/2013   Procedure: CARDIOVERSION;  Surgeon: Sanda Klein, MD;  Location: [place] [facility];  Service: Cardiovascular;  Laterality: N/A;   dental implants     ELECTROPHYSIOLOGIC STUDY N/A 09/15/2015   Procedure: Stephanie Coup Ablation;  Surgeon: Evans Lance, MD;  Location: [place] [facility];  Service: Cardiovascular;  Laterality: N/A;   Evacution of epidural lumbar epidural abscess  1999   INSERT / REPLACE / REMOVE PACEMAKER  11/2008   MITRAL VALVE REPLACEMENT     w [id number] [facility]   PERMANENT PACEMAKER INSERTION N/A 08/05/2011   Procedure: PERMANENT PACEMAKER INSERTION;  Surgeon: Evans Lance, MD;  Location: [facility];  Service: Cardiovascular;  Laterality: N/A;   RIGHT HEART CATH N/A 02/26/2021   Procedure: RIGHT HEART CATH;  Surgeon: Larey Dresser, MD;  Location: [place] [facility];  Service: Cardiovascular;  Laterality: N/A;   THYROIDECTOMY     TONSILLECTOMY     VALVE REPLACEMENT  2000    His Family History Is Significant For: Family History  Problem Relation [name] of Onset   Heart disease Mother    Heart failure Mother    Heart disease Father    Heart failure Father    Sleep apnea Neg Hx     His Social History Is  Significant For: Social History   Socioeconomic History   Marital status: Married    Spouse name: Not on file   Number of children: 1   Years of education: 18   Highest education level: Master's degree (e.g., MA, MS, MEng, MEd, MSW, MBA)  Occupational History   Occupation: retired from [employer], manufacturing systems  Tobacco Use   Smoking status: Never   Smokeless tobacco: Never  Vaping Use   Vaping Use: Never used  Substance and Sexual Activity   Alcohol use: Yes    Alcohol/week: 0.0 standard drinks of alcohol    Comment: occasionally   Drug use: No   Sexual activity: Not on file  Other Topics Concern   Not on file  Social History Narrative   Lives with wife in a  one story home.  Has one daughter.  Retired.  Education: Masters.   Social Determinants of Health   Financial Resource Strain: Not on file  Food Insecurity: Not on file  Transportation Needs: Not on file  Physical Activity: Not on file  Stress: Not on file  Social Connections: Not on file    His Allergies Are:  No Known Allergies:   His Current Medications Are:  Outpatient Encounter Medications as of 10/20/2022  Medication Sig   acetaminophen (TYLENOL) 500 MG tablet Take 1,000 mg by mouth every 6 (six) hours as needed (pain).   amiodarone (PACERONE) 200 MG tablet TAKE 1 TABLET BY MOUTH EVERY DAY   amoxicillin (AMOXIL) 500 MG capsule TAKE 4 CAPSULES BY MOUTH 30 MINUTES PRIOR TO DENTAL WORK   furosemide (LASIX) 20 MG tablet Take 3 tablets (60 mg total) by mouth 2 (two) times daily.   levothyroxine (SYNTHROID) 88 MCG tablet Take 1 tablet (88 mcg total) by mouth daily.   melatonin 5 MG TABS 5 mg at bedtime as needed.   metoprolol succinate (TOPROL-XL) 25 MG 24 hr tablet Take 1 tablet (25 mg total) by mouth daily.   Multiple Vitamin (MULTIVITAMIN WITH MINERALS) TABS tablet Take 1 tablet by mouth in the morning.   mupirocin ointment (BACTROBAN) 2 % Apply 1 Application topically daily.   nitroGLYCERIN (NITROSTAT) 0.4 MG SL  tablet Place 1 tablet (0.4 mg total) under the tongue every 5 (five) minutes x 3 doses as needed for chest pain.   quiniDINE gluconate 324 MG CR tablet Take 1 tablet (324 mg total) by mouth 2 (two) times daily.   spironolactone (ALDACTONE) 25 MG tablet Take 0.5 tablets (12.5 mg total) by mouth daily.   valsartan (DIOVAN) 40 MG tablet Take 1 tablet (40 mg total) by mouth daily.   Vericiguat (VERQUVO) 10 MG TABS Take 10 mg by mouth daily.   warfarin (COUMADIN) 5 MG tablet TAKE AS DIRECTED BY THE COUMADIN CLINIC   zinc gluconate 50 MG tablet Take 1 tablet (50 mg total) by mouth daily.   No facility-administered encounter medications on file as of 10/20/2022.  :   Review of Systems:  Out of a complete 14 point review of systems, all are reviewed and negative with the exception of these symptoms as listed below:   Review of Systems  Neurological:        Pt here for sleep consult  Pt states uses CPAP every night Pt states last sleep was 2007     ESS;13 FSS:16    Objective:  Neurological Exam  Physical Exam Physical Examination:   Vitals:   10/20/22 1359  BP: 103/67  Pulse: 65    General Examination: The patient is a very pleasant 82 y.o. male in no acute distress. He appears well-developed and well-nourished and well groomed.   HEENT: Normocephalic, atraumatic, pupils are equal, round and reactive to light, extraocular tracking is good without limitation to gaze excursion or nystagmus noted. Hearing is grossly intact. Face is symmetric with normal facial animation. Speech is clear with no dysarthria noted. There is no hypophonia. There is no lip, neck/head, jaw or voice tremor. Neck is supple with full range of passive and active motion. There are no carotid bruits on auscultation. Oropharynx exam reveals: mild mouth dryness, adequate dental hygiene and moderate airway crowding, due to redundant soft palate and larger uvula. Mallampati is class III. Tongue protrudes centrally and  palate elevates symmetrically. Tonsils are absent. Neck size is 17 inches.  Chest: Clear to auscultation without wheezing, rhonchi or crackles noted.  Heart: S1+S2+0, regular and normal without murmurs, rubs or gallops noted.   Abdomen: Soft, non-tender and non-distended.  Extremities: There is 1-2+ pitting edema in the distal lower extremities bilaterally.   Skin: Warm and dry without trophic changes noted.   Musculoskeletal: exam reveals no obvious joint deformities.   Neurologically:  Mental status: The patient is awake, alert and oriented in all 4 spheres. His immediate and remote memory, attention, language skills and fund of knowledge are appropriate. There is no evidence of aphasia, agnosia, apraxia or anomia. Speech is clear with normal prosody and enunciation. Thought process is linear. Mood is normal and affect is normal.  Cranial nerves II - XII are as described above under HEENT exam.  Motor exam: Normal bulk, strength and tone is noted. There is no obvious action or resting tremor.  Fine motor skills and coordination: grossly intact.  Cerebellar testing: No dysmetria or intention tremor. There is no truncal or gait ataxia.  Sensory exam: intact to light touch in the upper and lower extremities.  Gait, station and balance: He stands easily. No veering to one side is noted. No leaning to one side is noted. Posture is [name]-appropriate and stance is narrow based. Gait shows normal stride length and normal pace. No problems turning are noted.   Assessment and plan:  In summary, Austin [name] is a very pleasant 82 y.o.-year old male with an underlying complex medical history of atrial fibrillation, status post AICD placement, nonischemic cardiomyopathy, status post mitral valve replacement, syncope, history of V. tach, history of intraspinal abscess, hearing loss, edema, hypothyroidism, anemia, and overweight state, who presents for evaluation of his obstructive sleep apnea.  He  currently has a PAP machine but it is not working properly and it is older, approximately 80 years old, likely in need for replacement.  We will proceed with a home sleep test to reevaluate his sleep disordered breathing and then should be able to write for a replacement machine for him.  He is advised to continue with his current machine with full compliance.   I talked to the patient about sleep apnea and possible alternative treatment options but we mutually agreed that we will likely pursue ongoing PAP therapy after his home sleep test.  I explained the home sleep test procedure to the patient in detail.  We will plan a follow-up after home sleep testing and after he has established treatment with his new PAP machine.  We will get in touch with his current DME provider to see if we can get compliance data from them. I answered all his questions today and he was in agreement with our plan.  Thank you very much for allowing me to participate in the care of this nice patient. If I can be of any further assistance to you please do not hesitate to call me at [phone].  Sincerely,   Austin Age, MD, PhD

## 2022-10-21 ENCOUNTER — Ambulatory Visit: Payer: Medicare Other | Admitting: Podiatry

## 2022-10-21 DIAGNOSIS — L97512 Non-pressure chronic ulcer of other part of right foot with fat layer exposed: Secondary | ICD-10-CM | POA: Diagnosis not present

## 2022-10-21 DIAGNOSIS — I739 Peripheral vascular disease, unspecified: Secondary | ICD-10-CM | POA: Diagnosis not present

## 2022-10-21 MED ORDER — CEFADROXIL 500 MG PO CAPS: 500.0000 mg | ORAL_CAPSULE | Freq: Two times a day (BID) | ORAL | 0 refills | Status: DC

## 2022-10-21 NOTE — Progress Notes (Signed)
  Subjective:  Patient ID: Austin [name], male    DOB: [birth year],  MRN: [id number]  No chief complaint on file.   82 y.o. male presents with the above complaint. History confirmed with patient.  Pain is improving he is going for his circulation testing next week  Physical Exam: warm, good capillary refill, no trophic changes or ulcerative lesions, +1 pitting edema, he has nonpalpable DP and PT pulses, normal sensory exam, and full-thickness ulceration lateral fifth metatarsal measures 5 mm x 5 mm, granular wound bed surrounding hyperkeratosis.     Assessment:   1. 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 PAD (peripheral artery disease) ([place])      Plan:  Patient was evaluated and treated and all questions answered.  Ulcer right foot -We discussed the etiology and factors that are a part of the wound healing process.  We also discussed the risk of infection both soft tissue and osteomyelitis from open ulceration.  Discussed the risk of limb loss if this happens or worsens. -No debridement necessary today. -Dressed with Iodosorb, DSD. -Continue home dressing changes daily with 4 x 4 gauze and mupirocin  -Vascular testing new ABIs scheduled next week       Return in about 3 weeks (around 11/11/2022) for wound care.

## 2022-10-25 NOTE — Progress Notes (Signed)
Remote ICD transmission.   

## 2022-10-26 ENCOUNTER — Ambulatory Visit (INDEPENDENT_AMBULATORY_CARE_PROVIDER_SITE_OTHER): Payer: Medicare Other

## 2022-10-26 DIAGNOSIS — Z5181 Encounter for therapeutic drug level monitoring: Secondary | ICD-10-CM

## 2022-10-26 LAB — POCT INR: INR: 3 (ref 2.0–3.0)

## 2022-10-26 NOTE — Patient Instructions (Signed)
Description   Spoke with pt and instructed to continue taking warfarin 1/2 tablet daily except for 1 tablet on Mondays, Wednesdays and Fridays.  Recheck INR in 2 weeks.  Coumadin Clinic [phone].  AMIO DOSE 200MG  DAILY.   Pt's cell phone [phone] in case difficulty reaching pt on home phone.

## 2022-10-27 NOTE — Progress Notes (Signed)
PCP: [name], MD Cardiology: Dr. Katrinka Blazing HF Cardiology: Dr. Shirlee Latch EP: Dr. Ladona Ridgel  82 y.o. with history of mechanical mitral valve s/p MV endocarditis, VT, nonischemic cardiomyopathy, complete heart block was referred by Dr. Katrinka Blazing for evaluation of CHF.  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 He subsequently developed a presumed nonischemic cardiomyopathy.  This was complicated by VT, he had a VT ablation in 2017 and had a Medtronic CRT-D device implanted. This had to be removed due to infection in 11/12 and was reimplanted in 1/13. Last echo in 5/22 showed EF 30% with normal RV, mechanical MV stable with mean gradient 3 mmHg.  He has been in atrial fibrillation persistently since late 2021.  He is on amiodarone and has been cardioverted in the past. His LV lead is not functional.  He has complete heart block and is persistently RV pacing.   Had RHC 02/26/21 RA 4, PA 32/12, PCWP 11, CO 4.6, CI 2.2. Normal filling pressures and slightly low cardiac output.   Digoxin was stopped due to elevated level.   He saw Dr. Ladona Ridgel in 9/22 and was in slow VT in the office.  He was paced out of VT and amiodarone was increased to 200 mg daily.   He saw Dr. Ladona Ridgel, and placement of a left bundle or LV lead was decided to be too risky a procedure.  He was referred to Dr. Marquette Saa with the hope that he could have a VT ablation and get off amiodarone, which is likely playing a role in his symptoms.     S/p VT ablation with Dr. Marquette Saa 1/23.  Echo in 4/23 showed EF 20-25% with diffuse hypokinesis and septal-lateral dyssynchrony, mildly decreased RV systolic function, mechanical MV with normal function, dilated IVC.   He was seen in the office in 7/23, noted to be in atrial flutter in the atria and slow VT in the ventricles.  VT was pace terminated by Dr. Graciela Husbands and lower threshold set for ATP.  Later in 7/23, he had another VT episode and had an ICD discharge.   Quinidine was then started by Dr. Ladona Ridgel.   Follow up 10/23, had runs of VT terminated by ATP. Stable NYHA III and volume stable by OptiVol.  Echo 3/24 showed EF 20-25%, mildly decreased RV systolic function, moderate TR, IVC dilated, stable mechanical mitral valve with mean gradient 4 mmHg.   Today he returns for HF follow up. Overall feeling fine. He remains SOB walking on flat ground, more fatigued when walking or doing leg exercises. Goes to the gym 4x/week. No recent swelling. Denies palpitations, CP, dizziness, edema, or PND/Orthopnea. Appetite ok. No fever or chills. Weight at home 200 pounds. Taking all medications. He feels generally more fatigued and attributes his symptoms to amiodarone use. Had several low BPs at home (one was 51/39), several 70/51. Most recently systolic 100s. He cares for his wife who has Parkinsons.  Medtronic device interrogation: OptiVol up, thoracic impedence down, 1 hr daily activity, no VT, short bursts of AF/AT.  ECG (personally reviewed): none ordered today.  Labs (7/22): K 4.6, creatinine 1.3 Labs (8/22): K 4.4, creatinine 1.23 Labs (10/22): K 3.9, creatinine 1.36 Labs (12/22): K 4.2, creatinine 1.39 Labs (1/23): K 4.3, creatinine 1.15 Labs (2/23): TSH 10, K 4.1, creatinine 1.54 Labs (6/23): BNP 798, K 4.1, creatinine 1.3 Labs (7/23): BNP 1244, LFTs normal, K 4.2, creatinine 1.49 Labs (10/23): K 4, creatinine 1.72, TSH 5.7, transferrin saturation 28%, hgb 12.8,  plts 128, LFTs normal Labs (3/24): K 4.9, creatinine 2.26, TSH 10.9, hgb 12.4  PMH: 1. 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 2. H/o VT: VT ablation in 2017.  - Redo VT ablation in 1/23 with Dr. Marquette Saa - Arita Miss termination of VT in office in 7/23.  - ICD shock 7/23, started quinidine.  3. Atrial fibrillation/atypical flutter: Persistent since 2021.  Failed amiodarone.  4. PAD: Tibial occlusive disease.  5. 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  6.  Hypothyroidism.  7. Chronic systolic CHF: Presumed nonischemic cardiomyopathy.  Medtronic CRT-D device, LV lead is nonfunctional.  - Echo (2012): EF 45% - Echo (1/17): EF 30-35% - Echo (6/21): EF 15-20% - Echo (11/21): EF 30-35%  - Echo (5/22): EF 30%, normal RV, mechanical mitral valve mean gradient 3 mmHg.  - RHC (8/22): RA mean 4, RV 33/4,PA 32/12 mean 21, PCWP mean 11, Oxygen saturations:PA 71%, AO 98%, Cardiac Output (Fick) 4.66 Cardiac Index (Fick) 2.18 - Echo (4/23): EF 20-25% with diffuse hypokinesis and septal-lateral dyssynchrony, mildly decreased RV systolic function, mechanical MV with normal function, dilated IVC.  - Echo (3/24): EF 20-25%, mildly decreased RV systolic function, moderate TR, IVC dilated, stable mechanical mitral valve with mean gradient 4 mmHg.   Social History   Socioeconomic History   Marital status: Married    Spouse name: Not on file   Number of children: 1   Years of education: 4   Highest education level: Master's degree (e.g., MA, MS, MEng, MEd, MSW, MBA)  Occupational History   Occupation: retired from [employer]  Tobacco Use   Smoking status: Never   Smokeless tobacco: Never  Vaping Use   Vaping Use: Never used  Substance and Sexual Activity   Alcohol use: Yes    Alcohol/week: 0.0 standard drinks of alcohol    Comment: occasionally   Drug use: No   Sexual activity: Not on file  Other Topics Concern   Not on file  Social History Narrative   Lives with wife in a one story home.  Has one daughter.  Retired.  Education: Masters.   Social Determinants of Health   Financial Resource Strain: Not on file  Food Insecurity: Not on file  Transportation Needs: Not on file  Physical Activity: Not on file  Stress: Not on file  Social Connections: Not on file  Intimate Partner Violence: Not on file   Family History  Problem Relation Age of Onset   Heart disease Mother    Heart failure Mother    Heart disease Father    Heart failure  Father    Sleep apnea Neg Hx    ROS: All systems reviewed and negative except as per HPI.   Current Outpatient Medications  Medication Sig Dispense Refill   acetaminophen (TYLENOL) 500 MG tablet Take 1,000 mg by mouth every 6 (six) hours as needed (pain).     amiodarone (PACERONE) 200 MG tablet TAKE 1 TABLET BY MOUTH EVERY DAY 90 tablet 2   amoxicillin (AMOXIL) 500 MG capsule TAKE 4 CAPSULES BY MOUTH 30 MINUTES PRIOR TO DENTAL WORK 4 capsule 2   cefadroxil (DURICEF) 500 MG capsule Take 1 capsule (500 mg total) by mouth 2 (two) times daily. 20 capsule 0   furosemide (LASIX) 20 MG tablet Take 3 tablets (60 mg total) by mouth 2 (two) times daily. (Patient taking differently: Take 60 mg by mouth 2 (two) times daily. Patient take 2 tablets twice a day.) 200 tablet 6   levothyroxine (  SYNTHROID) 88 MCG tablet Take 1 tablet (88 mcg total) by mouth daily. 90 tablet 0   melatonin 5 MG TABS 5 mg at bedtime as needed.     metoprolol succinate (TOPROL-XL) 25 MG 24 hr tablet Take 1 tablet (25 mg total) by mouth daily. 90 tablet 3   Multiple Vitamin (MULTIVITAMIN WITH MINERALS) TABS tablet Take 1 tablet by mouth in the morning.     mupirocin ointment (BACTROBAN) 2 % Apply 1 Application topically daily. 30 g 2   nitroGLYCERIN (NITROSTAT) 0.4 MG SL tablet Place 1 tablet (0.4 mg total) under the tongue every 5 (five) minutes x 3 doses as needed for chest pain. 25 tablet 2   quiniDINE gluconate 324 MG CR tablet Take 1 tablet (324 mg total) by mouth 2 (two) times daily. 60 tablet 11   spironolactone (ALDACTONE) 25 MG tablet Take 0.5 tablets (12.5 mg total) by mouth daily. 90 tablet 3   valsartan (DIOVAN) 40 MG tablet Take 1 tablet (40 mg total) by mouth daily. (Patient taking differently: Take 40 mg by mouth daily. Patient takes 0.5 tablet daily.) 30 tablet 11   Vericiguat (VERQUVO) 10 MG TABS Take 10 mg by mouth daily. 30 tablet 11   warfarin (COUMADIN) 5 MG tablet TAKE AS DIRECTED BY THE COUMADIN CLINIC 80  tablet 1   zinc gluconate 50 MG tablet Take 1 tablet (50 mg total) by mouth daily. 90 tablet 1   No current facility-administered medications for this encounter.   BP 102/70   Pulse 81   Wt 92.9 kg (204 lb 12.8 oz)   SpO2 94%   BMI 27.02 kg/m   Wt Readings from Last 3 Encounters:  10/29/22 92.9 kg (204 lb 12.8 oz)  10/20/22 95.5 kg (210 lb 9.6 oz)  09/28/22 93.4 kg (206 lb)   Physical Exam General:  NAD. No resp difficulty, walked into clinic HEENT: Normal Neck: Supple. No JVD. Carotids 2+ bilat; no bruits. No lymphadenopathy or thryomegaly appreciated. Cor: PMI nondisplaced. Regular rate & rhythm. No rubs, gallops or murmurs. Lungs: Clear Abdomen: Soft, nontender, nondistended. No hepatosplenomegaly. No bruits or masses. Good bowel sounds. Extremities: No cyanosis, clubbing, rash, 1+ BLE pre-tibial edema Neuro: Alert & oriented x 3, cranial nerves grossly intact. Moves all 4 extremities w/o difficulty. Affect pleasant.  Assessment/Plan: 1. Chronic systolic CHF: Long-standing, suspected nonischemic cardiomyopathy.  Had RHC on 02/26/21 with normal filling pressures and mildly low cardiac output.  Echo in 4/23 showed  EF 20-25% with diffuse hypokinesis and septal-lateral dyssynchrony, mildly decreased RV systolic function, mechanical MV with normal function, dilated IVC.  He has a Medtronic CRT-D device present, but the LV lead is not functional.  He has complete heart block, so has been RV paced chronically.   He appears to be atrially paced during last visit. NYHA class III-IIIb, worse more recently.  Weight is up and he is volume overloaded by exam and by OptiVol. - Increase Lasix back to 60 mg bid.  BMET/BNP today with BMET in 10 days. - I will ask device RN to sent transmission in 1 week.  - Continue spironolactone 12.5 mg qhs, will not increase due to soft BP and higher creatinine.  - With orthostatic symptoms, he is now off Entresto and on valsartan 40 mg q qhs).   - Continue  Toprol XL 25 mg qhs.  - Continue Verquvo 10 mg daily.  - Off digoxin due to persistently elevated level.  - The patient has CHB and has been chronically  RV pacing as his LV lead is nonfunctional (when not in VT).  This is not ideal and creates dyssynchrony.  Remains symptomatic with limited ability to titrate meds. He has seen Dr. Ladona Ridgel, it would be possible to place left bundle lead but procedure would be complicated.  After further discussion with Dr. Ladona Ridgel, it was decided that risk would be too high and left bundle lead will not be attempted.   - We discussed baroreceptor activation therapy. He is interested. I will ask Research RN to speak with him. 2. Atrial fibrillation/flutter: Paroxysmal.   - He is on amiodarone 200 mg daily.  Recent LFTs stable, he is on Levoxyl for hypothyroidism => TSH recently elevated, PCP manages.  He will need a regular eye exam.  - Continue Warfarin.  3. Mechanical mitral valve: Valve looked normal on recent echo.    - Continue warfarin, INR goal 3-3.5.  - He has not been on ASA.  4. VT: History of VT and VT ablation in 2017.  MDT ICD.  Redo VT ablation in 1/23 (Dr. Marquette Saa).  He is on amiodarone, this was increased to 200 mg daily recently with recurrent VT.  He has tolerated amiodarone poorly historically.  In 7/23, slow VT was pace-terminated in the office, then he had an ICD shock for VT later in 7/23.  He was started on quinidine after this.  Still having periodic VT treated with ATP, asymptomatic.  - Continue amiodarone 200 mg daily.  - Continue quinidine.  5. Complete heart block: Per notes, patient is pacer dependent due to CHB.  - See above, upgrade to CRT thought to be too risky.    Follow up in 3 months with Dr. Shirlee Latch.  Anderson Malta [place], FNP-BC 10/29/2022

## 2022-10-28 ENCOUNTER — Ambulatory Visit (HOSPITAL_COMMUNITY)
Admission: RE | Admit: 2022-10-28 | Discharge: 2022-10-28 | Disposition: A | Discharge status: Home / Self Care | Payer: Medicare Other | Source: Ambulatory Visit | Attending: Podiatry | Admitting: Podiatry

## 2022-10-28 DIAGNOSIS — L97512 Non-pressure chronic ulcer of other part of right foot with fat layer exposed: Secondary | ICD-10-CM | POA: Insufficient documentation

## 2022-10-28 DIAGNOSIS — I739 Peripheral vascular disease, unspecified: Secondary | ICD-10-CM | POA: Diagnosis not present

## 2022-10-29 ENCOUNTER — Other Ambulatory Visit (HOSPITAL_COMMUNITY): Payer: Self-pay

## 2022-10-29 ENCOUNTER — Encounter (HOSPITAL_COMMUNITY): Payer: Self-pay

## 2022-10-29 ENCOUNTER — Ambulatory Visit (HOSPITAL_COMMUNITY)
Admission: RE | Admit: 2022-10-29 | Discharge: 2022-10-29 | Disposition: A | Discharge status: Home / Self Care | Payer: Medicare Other | Source: Ambulatory Visit | Attending: Family Medicine | Admitting: Family Medicine

## 2022-10-29 VITALS — BP 102/70 | HR 81 | Wt 204.8 lb

## 2022-10-29 DIAGNOSIS — I5022 Chronic systolic (congestive) heart failure: Secondary | ICD-10-CM | POA: Insufficient documentation

## 2022-10-29 DIAGNOSIS — I4891 Unspecified atrial fibrillation: Secondary | ICD-10-CM

## 2022-10-29 DIAGNOSIS — Z95810 Presence of automatic (implantable) cardiac defibrillator: Secondary | ICD-10-CM

## 2022-10-29 DIAGNOSIS — I472 Ventricular tachycardia, unspecified: Secondary | ICD-10-CM | POA: Diagnosis not present

## 2022-10-29 DIAGNOSIS — I4892 Unspecified atrial flutter: Secondary | ICD-10-CM | POA: Insufficient documentation

## 2022-10-29 DIAGNOSIS — R0602 Shortness of breath: Secondary | ICD-10-CM | POA: Insufficient documentation

## 2022-10-29 DIAGNOSIS — Z7901 Long term (current) use of anticoagulants: Secondary | ICD-10-CM | POA: Diagnosis not present

## 2022-10-29 DIAGNOSIS — I480 Paroxysmal atrial fibrillation: Secondary | ICD-10-CM | POA: Insufficient documentation

## 2022-10-29 DIAGNOSIS — Z79899 Other long term (current) drug therapy: Secondary | ICD-10-CM | POA: Diagnosis not present

## 2022-10-29 DIAGNOSIS — R5383 Other fatigue: Secondary | ICD-10-CM | POA: Diagnosis not present

## 2022-10-29 DIAGNOSIS — I442 Atrioventricular block, complete: Secondary | ICD-10-CM | POA: Insufficient documentation

## 2022-10-29 DIAGNOSIS — Z952 Presence of prosthetic heart valve: Secondary | ICD-10-CM | POA: Insufficient documentation

## 2022-10-29 DIAGNOSIS — E039 Hypothyroidism, unspecified: Secondary | ICD-10-CM | POA: Diagnosis not present

## 2022-10-29 LAB — VAS US ABI WITH/WO TBI

## 2022-10-29 MED ORDER — FUROSEMIDE 20 MG PO TABS: 60.0000 mg | ORAL_TABLET | Freq: Two times a day (BID) | ORAL | 8 refills | Status: DC

## 2022-10-29 NOTE — Patient Instructions (Addendum)
Thank you for coming in today  Labs were done today, if any labs are abnormal the clinic will call you No news is good news  Medications: Increase Lasix to 60 mg twice daily  Follow up appointments:  Your physician recommends that you schedule a follow-up appointment in:  Labs in 10 days   3 months with Dr. Earlean Shawl will receive a reminder letter in the mail a few months in advance. If you don't receive a letter, please call our office to schedule the follow-up appointment.    Do the following things EVERYDAY: Weigh yourself in the morning before breakfast. Write it down and keep it in a log. Take your medicines as prescribed Eat low salt foods--Limit salt (sodium) to 2000 mg per day.  Stay as active as you can everyday Limit all fluids for the day to less than 2 liters   At the [facility], you and your health needs are our priority. As part of our continuing mission to provide you with exceptional heart care, we have created designated Provider Care Teams. These Care Teams include your primary Cardiologist (physician) and Advanced Practice Providers (APPs- Physician Assistants and Nurse Practitioners) who all work together to provide you with the care you need, when you need it.   You may see any of the following providers on your designated Care Team at your next follow up: Dr Arvilla Meres Dr Marca Ancona Dr. Marcos Eke, NP Robbie Lis, [place] [facility] [place], [place] Brynda Peon, NP Karle Plumber, PharmD   Please be sure to bring in all your medications bottles to every appointment.    Thank you for choosing [facility]  If you have any questions or concerns before your next appointment please send [place] a message through [place] or call our office at [phone].    TO LEAVE A MESSAGE FOR THE NURSE SELECT OPTION 2, PLEASE LEAVE A MESSAGE INCLUDING: YOUR NAME DATE OF  BIRTH CALL BACK NUMBER REASON FOR CALL**this is important as we prioritize the call backs  YOU WILL RECEIVE A CALL BACK THE SAME DAY AS LONG AS YOU CALL BEFORE 4:00 PM

## 2022-11-01 DIAGNOSIS — D485 Neoplasm of uncertain behavior of skin: Secondary | ICD-10-CM | POA: Diagnosis not present

## 2022-11-01 DIAGNOSIS — Z85828 Personal history of other malignant neoplasm of skin: Secondary | ICD-10-CM | POA: Diagnosis not present

## 2022-11-01 DIAGNOSIS — L853 Xerosis cutis: Secondary | ICD-10-CM | POA: Diagnosis not present

## 2022-11-01 DIAGNOSIS — L820 Inflamed seborrheic keratosis: Secondary | ICD-10-CM | POA: Diagnosis not present

## 2022-11-01 DIAGNOSIS — C44722 Squamous cell carcinoma of skin of right lower limb, including hip: Secondary | ICD-10-CM | POA: Diagnosis not present

## 2022-11-01 DIAGNOSIS — C44321 Squamous cell carcinoma of skin of nose: Secondary | ICD-10-CM | POA: Diagnosis not present

## 2022-11-01 DIAGNOSIS — C44729 Squamous cell carcinoma of skin of left lower limb, including hip: Secondary | ICD-10-CM | POA: Diagnosis not present

## 2022-11-01 DIAGNOSIS — L821 Other seborrheic keratosis: Secondary | ICD-10-CM | POA: Diagnosis not present

## 2022-11-01 DIAGNOSIS — L84 Corns and callosities: Secondary | ICD-10-CM | POA: Diagnosis not present

## 2022-11-01 DIAGNOSIS — D692 Other nonthrombocytopenic purpura: Secondary | ICD-10-CM | POA: Diagnosis not present

## 2022-11-02 ENCOUNTER — Other Ambulatory Visit (HOSPITAL_COMMUNITY): Payer: Self-pay

## 2022-11-02 ENCOUNTER — Telehealth (HOSPITAL_COMMUNITY): Payer: Self-pay | Admitting: Pharmacy Technician

## 2022-11-02 ENCOUNTER — Encounter (HOSPITAL_COMMUNITY): Payer: Self-pay

## 2022-11-02 NOTE — Telephone Encounter (Signed)
Advanced Heart Failure Patient Advocate Encounter  The patient was approved for a [employer] grant that will help cover the cost of Quinidine. This grant will cover the medication regardless of cost while it is active. Eligibility, 07/26/22 - 07/26/23.  ID [id number]  BIN [id number]  PCN [id number]  Group [id number]  Called [facility], provided billing information. Emailed patient copy of the grant information.  Archer Asa, CPhT

## 2022-11-08 ENCOUNTER — Ambulatory Visit (HOSPITAL_COMMUNITY)
Admission: RE | Admit: 2022-11-08 | Discharge: 2022-11-08 | Disposition: A | Discharge status: Home / Self Care | Payer: Medicare Other | Source: Ambulatory Visit | Attending: Internal Medicine | Admitting: Internal Medicine

## 2022-11-08 ENCOUNTER — Ambulatory Visit (INDEPENDENT_AMBULATORY_CARE_PROVIDER_SITE_OTHER): Payer: Medicare Other

## 2022-11-08 ENCOUNTER — Telehealth (HOSPITAL_COMMUNITY): Payer: Self-pay

## 2022-11-08 DIAGNOSIS — Z95810 Presence of automatic (implantable) cardiac defibrillator: Secondary | ICD-10-CM | POA: Diagnosis not present

## 2022-11-08 DIAGNOSIS — I5022 Chronic systolic (congestive) heart failure: Secondary | ICD-10-CM | POA: Diagnosis not present

## 2022-11-08 LAB — BASIC METABOLIC PANEL
Anion gap: 9 (ref 5–15)
BUN: 55 mg/dL — ABNORMAL HIGH (ref 8–23)
CO2: 27 mmol/L (ref 22–32)
Calcium: 8.9 mg/dL (ref 8.9–10.3)
Chloride: 98 mmol/L (ref 98–111)
Creatinine, Ser: 2.46 mg/dL — ABNORMAL HIGH (ref 0.61–1.24)
GFR, Estimated: 26 mL/min — ABNORMAL LOW (ref >60)
Glucose, Bld: 89 mg/dL (ref 70–99)
Potassium: 4.1 mmol/L (ref 3.5–5.1)
Sodium: 134 mmol/L — ABNORMAL LOW (ref 135–145)

## 2022-11-08 LAB — BRAIN NATRIURETIC PEPTIDE: B Natriuretic Peptide: 528.8 pg/mL — ABNORMAL HIGH (ref 0.0–100.0)

## 2022-11-08 NOTE — Telephone Encounter (Signed)
-----   Message from Jacklynn Ganong, [place] sent at 11/08/2022  2:07 PM EDT ----- Kidney function up a bit after increasing diuretics.  Repeat BMET in 10 days

## 2022-11-08 NOTE — Telephone Encounter (Signed)
Patient's labs has been ordered and his appointment scheduled. Pt aware, agreeable, and verbalized understanding

## 2022-11-09 ENCOUNTER — Ambulatory Visit (INDEPENDENT_AMBULATORY_CARE_PROVIDER_SITE_OTHER): Payer: Medicare Other | Admitting: *Deleted

## 2022-11-09 DIAGNOSIS — Z5181 Encounter for therapeutic drug level monitoring: Secondary | ICD-10-CM

## 2022-11-09 DIAGNOSIS — Z952 Presence of prosthetic heart valve: Secondary | ICD-10-CM | POA: Diagnosis not present

## 2022-11-09 LAB — POCT INR: INR: 2.3 (ref 2.0–3.0)

## 2022-11-09 NOTE — Progress Notes (Signed)
  Received: Today [place], Anderson Malta, FNP  Chauntel Windsor, Josephine Igo, RN Thank you!

## 2022-11-09 NOTE — Progress Notes (Signed)
EPIC Encounter for ICM Monitoring  Patient Name: Austin [name] is a 82 y.o. male Date: 11/09/2022 Primary Care Physican: [name], MD Primary Cardiologist: [place] Electrophysiologist: Ladona Ridgel 08/10/2022 Weight: 202 lbs 09/01/2022   Weight: 202 lbs 09/28/2022   Weight: 200 lbs 11/09/2022 Weight: 192.7 lbs   Since 29-Oct-2022 AT/AF  36 Time in AT/AF  0.2 hr/day (1.0%) Longest AT/AF  25 minutes               Spoke with patient and heart failure questions reviewed.  Transmission results reviewed.  Pt reports feeling well at this time and voices no complaints.     Optivol Thoracic impedance suggesting possible fluid accumulation from 3/23 and returned to normal after taking Furosemide 60 mg bid starting 4/5.   Prescribed:  Furosemide 20 mg Take 3 tablets (60 mg total) by mouth twice a day (increased 10/29/2022).   He self adjusts Furosemide when needed.  Spironolactone 25 mg take 0.5 tablet by mouth daily   Labs: 11/18/2022 BMET Scheduled 11/08/2022 Creatinine 2.46, BUN 55, Potassium 4.1, Sodium 134, GFR 26, BNP 528.8 10/19/2022 Creatinine 2.08, BUN 47, Potassium 4.8, Sodium 136, GFR 32  10/08/2022 Creatinine 2.48, BUN 47, Potassium 3.7, Sodium 135, GFR 26  09/27/2022 Creatinine 2.26, BUN 53, Potassium 4.9, Sodium 138, GFR 26.83 07/01/2022 Creatinine 2.02, BUN 49, Potassium 4.8, Sodium 138, GFR 33 06/03/2022 Creatinine 2.05, BUN 38, Potassium 4.2, Sodium 136, GFR 32 05/24/2022 Creatinine 2.01, BUN 42, Potassium 4.3, Sodium 136, GFR 33  05/17/2022 Creatinine 1.72, BUN 37, Potassium 4.0, Sodium 135, GFR 37.33  02/19/2022 Creatinine 1.49, BUN 35, Potassium 4.2, Sodium 139, GFR A complete set of results can be found in Results Review.   Recommendations: Encouraged to call if experiencing any fluid symptoms. Copy sent to Prince Rome, NP at HF clinic as requested following 4/5 OV.     Follow-up plan: ICM clinic phone appointment on 12/13/2022.   91 day device clinic remote  transmission 12/22/2022.      EP/Cardiology Office Visits:  Recall 01/27/2023 with Dr Shirlee Latch.   12/07/2022 with Dr Ladona Ridgel.     Copy of ICM check sent to Dr. Ladona Ridgel.    3 month ICM trend: 11/08/2022.    12-14 Month ICM trend:     Karie Soda, RN 11/09/2022 10:53 AM

## 2022-11-09 NOTE — Patient Instructions (Signed)
Spoke with pt and instructed to take 1 1/2 tablets tonight then continue taking warfarin 1/2 tablet daily except for 1 tablet on Mondays, Wednesdays and Fridays.  Recheck INR in 2 weeks.  Coumadin Clinic [phone].  AMIO DOSE 200MG  DAILY.   Pt's cell phone [phone] in case difficulty reaching pt on home phone.

## 2022-11-10 ENCOUNTER — Ambulatory Visit: Payer: Medicare Other | Admitting: Podiatry

## 2022-11-10 DIAGNOSIS — L97512 Non-pressure chronic ulcer of other part of right foot with fat layer exposed: Secondary | ICD-10-CM

## 2022-11-11 ENCOUNTER — Telehealth: Payer: Self-pay | Admitting: Neurology

## 2022-11-11 NOTE — Telephone Encounter (Signed)
HST- [employer] medicare no auth req.  Patient is scheduled at [facility] for 11/23/22 at 2 pm.  Mailed packet to the patient.

## 2022-11-12 ENCOUNTER — Encounter: Payer: Self-pay | Admitting: Cardiology

## 2022-11-13 ENCOUNTER — Encounter: Payer: Self-pay | Admitting: Podiatry

## 2022-11-13 NOTE — Progress Notes (Signed)
  Subjective:  Patient ID: KASEAN DENHERDER, male    DOB: 1940/10/13,  MRN: [id number]  Chief Complaint  Patient presents with   Foot Ulcer    3 week follow up right foot    82 y.o. male presents with the above complaint. History confirmed with patient.  He feels he is doing better, still somewhat painful  Physical Exam: warm, good capillary refill, no trophic changes or ulcerative lesions, +1 pitting edema, he has nonpalpable DP and PT pulses, normal sensory exam, and  Ulcer lateral fifth metatarsal head is healed  ABIs unchanged since previous examination  Assessment:   1. Ulcer of right foot with fat layer exposed      Plan:  Patient was evaluated and treated and all questions answered.  Ulcer right foot -Doing much better and appears to have healed at this point.  We discussed the risk of recurrence and appropriate shoe gear.  Return to see.  If it worsens or returns.     Return if symptoms worsen or fail to improve.

## 2022-11-18 ENCOUNTER — Ambulatory Visit (HOSPITAL_COMMUNITY)
Admission: RE | Admit: 2022-11-18 | Discharge: 2022-11-18 | Disposition: A | Discharge status: Home / Self Care | Payer: Medicare Other | Source: Ambulatory Visit | Attending: Cardiology | Admitting: Cardiology

## 2022-11-18 DIAGNOSIS — I5022 Chronic systolic (congestive) heart failure: Secondary | ICD-10-CM | POA: Diagnosis not present

## 2022-11-18 LAB — BASIC METABOLIC PANEL
Anion gap: 10 (ref 5–15)
BUN: 62 mg/dL — ABNORMAL HIGH (ref 8–23)
CO2: 27 mmol/L (ref 22–32)
Calcium: 8.8 mg/dL — ABNORMAL LOW (ref 8.9–10.3)
Chloride: 99 mmol/L (ref 98–111)
Creatinine, Ser: 2.88 mg/dL — ABNORMAL HIGH (ref 0.61–1.24)
GFR, Estimated: 21 mL/min — ABNORMAL LOW (ref >60)
Glucose, Bld: 96 mg/dL (ref 70–99)
Potassium: 4 mmol/L (ref 3.5–5.1)
Sodium: 136 mmol/L (ref 135–145)

## 2022-11-22 NOTE — Telephone Encounter (Signed)
Patient called wanting to r/s his HST.  I spoke with the patient. He is r/s for 12/15/22 at 2 pm.  Mailed new packet to the patient.

## 2022-11-23 ENCOUNTER — Ambulatory Visit (INDEPENDENT_AMBULATORY_CARE_PROVIDER_SITE_OTHER): Payer: Medicare Other

## 2022-11-23 ENCOUNTER — Encounter (HOSPITAL_COMMUNITY): Payer: Self-pay

## 2022-11-23 ENCOUNTER — Telehealth: Payer: Self-pay | Admitting: *Deleted

## 2022-11-23 ENCOUNTER — Other Ambulatory Visit (HOSPITAL_COMMUNITY): Payer: Self-pay

## 2022-11-23 DIAGNOSIS — I4891 Unspecified atrial fibrillation: Secondary | ICD-10-CM | POA: Diagnosis not present

## 2022-11-23 DIAGNOSIS — I5022 Chronic systolic (congestive) heart failure: Secondary | ICD-10-CM

## 2022-11-23 DIAGNOSIS — Z5181 Encounter for therapeutic drug level monitoring: Secondary | ICD-10-CM | POA: Diagnosis not present

## 2022-11-23 LAB — POCT INR: INR: 3.9 — AB (ref 2.0–3.0)

## 2022-11-23 NOTE — Patient Instructions (Signed)
Description   Spoke with pt and instructed to skip today's dosage of Warfarin, then continue taking warfarin 1/2 tablet daily except for 1 tablet on Mondays, Wednesdays and Fridays.  Recheck INR in 2 weeks.  Coumadin Clinic [phone].  AMIO DOSE 200MG  DAILY.   Pt's cell phone [phone] in case difficulty reaching pt on home phone.

## 2022-11-23 NOTE — Telephone Encounter (Signed)
Left a message for the pt to call back regarding INR testing.

## 2022-12-03 ENCOUNTER — Other Ambulatory Visit (HOSPITAL_COMMUNITY): Payer: Self-pay | Admitting: Cardiology

## 2022-12-07 ENCOUNTER — Ambulatory Visit: Payer: Medicare Other | Attending: Internal Medicine | Admitting: Internal Medicine

## 2022-12-07 ENCOUNTER — Encounter: Payer: Self-pay | Admitting: Internal Medicine

## 2022-12-07 VITALS — BP 102/52 | HR 82 | Ht 73.5 in | Wt 204.4 lb

## 2022-12-07 DIAGNOSIS — I4891 Unspecified atrial fibrillation: Secondary | ICD-10-CM

## 2022-12-07 DIAGNOSIS — I5022 Chronic systolic (congestive) heart failure: Secondary | ICD-10-CM | POA: Diagnosis not present

## 2022-12-07 DIAGNOSIS — Z95810 Presence of automatic (implantable) cardiac defibrillator: Secondary | ICD-10-CM

## 2022-12-07 LAB — CUP PACEART INCLINIC DEVICE CHECK
Battery Remaining Longevity: 23 mo
Battery Voltage: 2.93 V
Brady Statistic AP VP Percent: 42.52 %
Brady Statistic AP VS Percent: 0.54 %
Brady Statistic AS VP Percent: 55.49 %
Brady Statistic AS VS Percent: 1.45 %
Brady Statistic RA Percent Paced: 35.27 %
Brady Statistic RV Percent Paced: 97.96 %
Date Time Interrogation Session: 20240514160750
HighPow Impedance: 70 Ohm
Implantable Lead Connection Status: 753985
Implantable Lead Connection Status: 753985
Implantable Lead Implant Date: 20130110
Implantable Lead Implant Date: 20130110
Implantable Lead Location: 753859
Implantable Lead Location: 753860
Implantable Lead Model: 5076
Implantable Lead Model: 6935
Implantable Pulse Generator Implant Date: 20181010
Lead Channel Impedance Value: 304 Ohm
Lead Channel Impedance Value: 399 Ohm
Lead Channel Impedance Value: 418 Ohm
Lead Channel Pacing Threshold Amplitude: 1.125 V
Lead Channel Pacing Threshold Pulse Width: 0.4 ms
Lead Channel Sensing Intrinsic Amplitude: 0.375 mV
Lead Channel Sensing Intrinsic Amplitude: 0.875 mV
Lead Channel Sensing Intrinsic Amplitude: 13.75 mV
Lead Channel Sensing Intrinsic Amplitude: 4 mV
Lead Channel Setting Pacing Amplitude: 2.5 V
Lead Channel Setting Pacing Amplitude: 2.5 V
Lead Channel Setting Pacing Pulse Width: 0.4 ms
Lead Channel Setting Sensing Sensitivity: 0.3 mV
Zone Setting Status: 755011

## 2022-12-07 MED ORDER — AMIODARONE HCL 200 MG PO TABS: 200.0000 mg | ORAL_TABLET | Freq: Every day | ORAL | 2 refills | Status: DC

## 2022-12-07 NOTE — Patient Instructions (Addendum)
Medication Instructions:  Your physician has recommended you make the following change in your medication: Medication Decreased- Amiodarone 200 mg.  You will- : Take 1 tablet (200 mg total) by mouth daily. Do NOT take Amiodarone on Wednesday or Sunday   Lab Work: None ordered.  If you have labs (blood work) drawn today and your tests are completely normal, you will receive your results only by: MyChart Message (if you have MyChart) OR A paper copy in the mail If you have any lab test that is abnormal or we need to change your treatment, we will call you to review the results.  Testing/Procedures: None ordered.  Follow-Up: At [facility], you and your health needs are our priority.  As part of our continuing mission to provide you with exceptional heart care, we have created designated Provider Care Teams.  These Care Teams include your primary Cardiologist (physician) and Advanced Practice Providers (APPs -  Physician Assistants and Nurse Practitioners) who all work together to provide you with the care you need, when you need it.  Please schedule a 6 month follow up appointment with Dr. Lewayne [name]   The format for your next appointment:   In Person  Provider:   Lewayne Bunting, MD{or one of the following Advanced Practice Providers on your designated Care Team:   Francis Dowse, [place] Casimiro Needle "Mardelle Matte" Lanna Poche, [place]  Remote monitoring is used to monitor your ICD from home. This monitoring reduces the number of office visits required to check your device to one time per year. It allows [place] to keep an eye on the functioning of your device to ensure it is working properly. You are scheduled for a device check from home on 5/20. You may send your transmission at any time that day. If you have a wireless device, the transmission will be sent automatically. After your physician reviews your transmission, you will receive a postcard with your next transmission date.

## 2022-12-07 NOTE — Progress Notes (Signed)
HPI Mr. Battersby returns today for ongoing evaluation and treatment of VT, chronic systolic heart failure and atrial fib/flutter. He has a long h/o chronic systolic heart failure and sudden death with VF. He has undergone insertion. He has a h/o device infection requiring extraction including the removal of an old Medtronic Starfix lead. He has undergone 4 VT ablations, one by me and 3 by Dr. Carson Myrtle. He has been placed on quinidine in addition to amiodarone. He does not tolerated the amiodarone but has been taking it along with quinidine. He has slow VT and multiple ATPs with the last ICD shock for a slow VT in July. He has had his VT lowered to detect at 109/min. He has had several successful ATP's. He feels bad on amiodarone.  He is concerned about his wife who has developed neuro problems. No Known Allergies   Current Outpatient Medications  Medication Sig Dispense Refill   acetaminophen (TYLENOL) 500 MG tablet Take 1,000 mg by mouth every 6 (six) hours as needed (pain).     amoxicillin (AMOXIL) 500 MG capsule TAKE 4 CAPSULES BY MOUTH 30 MINUTES PRIOR TO DENTAL WORK 4 capsule 2   cefadroxil (DURICEF) 500 MG capsule Take 1 capsule (500 mg total) by mouth 2 (two) times daily. 20 capsule 0   furosemide (LASIX) 20 MG tablet Take 3 tablets (60 mg total) by mouth 2 (two) times daily. 180 tablet 8   levothyroxine (SYNTHROID) 88 MCG tablet Take 1 tablet (88 mcg total) by mouth daily. 90 tablet 0   melatonin 5 MG TABS 5 mg at bedtime as needed.     metoprolol succinate (TOPROL-XL) 25 MG 24 hr tablet Take 1 tablet (25 mg total) by mouth daily. 90 tablet 3   Multiple Vitamin (MULTIVITAMIN WITH MINERALS) TABS tablet Take 1 tablet by mouth in the morning.     mupirocin ointment (BACTROBAN) 2 % Apply 1 Application topically daily. 30 g 2   nitroGLYCERIN (NITROSTAT) 0.4 MG SL tablet Place 1 tablet (0.4 mg total) under the tongue every 5 (five) minutes x 3 doses as needed for chest pain. 25 tablet 2    quiniDINE gluconate 324 MG CR tablet Take 1 tablet (324 mg total) by mouth 2 (two) times daily. 60 tablet 11   spironolactone (ALDACTONE) 25 MG tablet TAKE 1/2 TABLET BY MOUTH EVERY DAY 45 tablet 3   valsartan (DIOVAN) 40 MG tablet Take 1 tablet (40 mg total) by mouth daily. (Patient taking differently: Take 40 mg by mouth daily. Patient takes 0.5 tablet daily.) 30 tablet 11   Vericiguat (VERQUVO) 10 MG TABS Take 10 mg by mouth daily. 30 tablet 11   warfarin (COUMADIN) 5 MG tablet TAKE AS DIRECTED BY THE COUMADIN CLINIC 80 tablet 1   zinc gluconate 50 MG tablet Take 1 tablet (50 mg total) by mouth daily. 90 tablet 1   amiodarone (PACERONE) 200 MG tablet Take 1 tablet (200 mg total) by mouth daily. Do NOT take Amiodarone on Wednesday or Sunday. 90 tablet 2   No current facility-administered medications for this visit.     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   Past Surgical History:  Procedure Laterality Date   BIV ICD GENERATOR CHANGEOUT N/A 05/04/2017   Procedure: BiV ICD Generator Changeout;  Surgeon: Marinus Maw, MD;  Location: [facility] INVASIVE CV LAB;  Service: Cardiovascular;  Laterality: N/A;   CARDIAC CATHETERIZATION  04/24/2002   CARDIAC VALVE REPLACEMENT     CARDIOVERSION  03/09/2012    Procedure: CARDIOVERSION;  Surgeon: Marinus Maw, MD;  Location: [facility] ENDOSCOPY;  Service: Cardiovascular;  Laterality: N/A;   CARDIOVERSION N/A 11/22/2013   Procedure: CARDIOVERSION;  Surgeon: Thurmon Fair, MD;  Location: [facility] ENDOSCOPY;  Service: Cardiovascular;  Laterality: N/A;   dental implants     ELECTROPHYSIOLOGIC STUDY N/A 09/15/2015   Procedure: Floyce Stakes Ablation;  Surgeon: Marinus Maw, MD;  Location: [facility] INVASIVE CV LAB;  Service: Cardiovascular;  Laterality: N/A;   Evacution of epidural lumbar epidural abscess  1999   INSERT / REPLACE / REMOVE PACEMAKER  11/2008   MITRAL VALVE REPLACEMENT     w [id number]   PERMANENT PACEMAKER INSERTION N/A 08/05/2011   Procedure: PERMANENT PACEMAKER INSERTION;  Surgeon: Marinus Maw, MD;  Location: [facility] CATH LAB;  Service: Cardiovascular;  Laterality: N/A;   RIGHT HEART CATH N/A 02/26/2021   Procedure: RIGHT HEART CATH;  Surgeon: Laurey Morale, MD;  Location: [facility] INVASIVE CV LAB;  Service: Cardiovascular;  Laterality: N/A;   THYROIDECTOMY     TONSILLECTOMY     VALVE REPLACEMENT  2000     Family History  Problem Relation Age of Onset   Heart disease Mother    Heart failure Mother    Heart disease Father    Heart failure Father    Sleep apnea Neg Hx      Social History   Socioeconomic History   Marital status: Married    Spouse name: Not on file   Number of children: 1   Years of education: 92   Highest education level: Master's degree (e.g., MA, MS, MEng, MEd, MSW, MBA)  Occupational History   Occupation: retired from [employer]  Tobacco Use   Smoking status: Never   Smokeless tobacco: Never  Vaping Use   Vaping Use: Never used  Substance and Sexual Activity   Alcohol use: Yes    Alcohol/week: 0.0 standard drinks of alcohol    Comment: occasionally   Drug use: No   Sexual activity: Not on file  Other Topics Concern   Not on file  Social History Narrative   Lives with wife in a one story home.  Has one daughter.   Retired.  Education: Masters.   Social Determinants of Health   Financial Resource Strain: Not on file  Food Insecurity: Not on file  Transportation Needs: Not on file  Physical Activity: Not on file  Stress: Not on file  Social Connections: Not on file  Intimate Partner Violence: Not on file     BP (!) 102/52   Pulse 82   Ht 6' 1.5" (1.867 m)   Wt 204 lb 6.4 oz (92.7 kg)   SpO2 96%   BMI 26.60 kg/m   Physical  Exam:  Well appearing NAD HEENT: Unremarkable Neck:  No JVD, no thyromegally Lymphatics:  No adenopathy Back:  No CVA tenderness Lungs:  Clear with no wheezes HEART:  Regular rate rhythm, no murmurs, no rubs, no clicks Abd:  soft, positive bowel sounds, no organomegally, no rebound, no guarding Ext:  2 plus pulses, no edema, no cyanosis, no clubbing Skin:  No rashes no nodules Neuro:  CN II through XII intact, motor grossly intact  EKG - atrial tachy with ventricular pacing  DEVICE  Normal device function.  See PaceArt for details.   Assess/Plan:  VT - he will continue amio and quinidine.  ICD - we increased his ATP and turned off the VT therapy in the slow VT zone. Chronic systolic heart failure - he has class 2B symptoms, at times class 3. He will continue his current meds. Atrial fib/flutter - he is in atrial tachy today. We could not pace out. Continue.   Sharlot Gowda Ayeden Gladman,MD

## 2022-12-08 ENCOUNTER — Ambulatory Visit (HOSPITAL_COMMUNITY)
Admission: RE | Admit: 2022-12-08 | Discharge: 2022-12-08 | Disposition: A | Discharge status: Home / Self Care | Payer: Medicare Other | Source: Ambulatory Visit | Attending: Family Medicine | Admitting: Family Medicine

## 2022-12-08 ENCOUNTER — Ambulatory Visit (INDEPENDENT_AMBULATORY_CARE_PROVIDER_SITE_OTHER): Payer: Medicare Other | Admitting: *Deleted

## 2022-12-08 ENCOUNTER — Telehealth: Payer: Self-pay | Admitting: Internal Medicine

## 2022-12-08 ENCOUNTER — Encounter (HOSPITAL_COMMUNITY): Payer: Self-pay

## 2022-12-08 VITALS — BP 118/70 | HR 85 | Wt 205.6 lb

## 2022-12-08 DIAGNOSIS — Z79899 Other long term (current) drug therapy: Secondary | ICD-10-CM | POA: Diagnosis not present

## 2022-12-08 DIAGNOSIS — Z79890 Hormone replacement therapy (postmenopausal): Secondary | ICD-10-CM | POA: Insufficient documentation

## 2022-12-08 DIAGNOSIS — I480 Paroxysmal atrial fibrillation: Secondary | ICD-10-CM | POA: Diagnosis not present

## 2022-12-08 DIAGNOSIS — E039 Hypothyroidism, unspecified: Secondary | ICD-10-CM | POA: Insufficient documentation

## 2022-12-08 DIAGNOSIS — Z7901 Long term (current) use of anticoagulants: Secondary | ICD-10-CM | POA: Insufficient documentation

## 2022-12-08 DIAGNOSIS — Z5181 Encounter for therapeutic drug level monitoring: Secondary | ICD-10-CM

## 2022-12-08 DIAGNOSIS — I442 Atrioventricular block, complete: Secondary | ICD-10-CM | POA: Diagnosis not present

## 2022-12-08 DIAGNOSIS — I4892 Unspecified atrial flutter: Secondary | ICD-10-CM | POA: Insufficient documentation

## 2022-12-08 DIAGNOSIS — R531 Weakness: Secondary | ICD-10-CM | POA: Insufficient documentation

## 2022-12-08 DIAGNOSIS — R5383 Other fatigue: Secondary | ICD-10-CM | POA: Diagnosis not present

## 2022-12-08 DIAGNOSIS — I4891 Unspecified atrial fibrillation: Secondary | ICD-10-CM | POA: Diagnosis not present

## 2022-12-08 DIAGNOSIS — I5022 Chronic systolic (congestive) heart failure: Secondary | ICD-10-CM | POA: Insufficient documentation

## 2022-12-08 LAB — POCT INR: INR: 6.2 — AB (ref 2.0–3.0)

## 2022-12-08 LAB — BASIC METABOLIC PANEL
Anion gap: 9 (ref 5–15)
BUN: 42 mg/dL — ABNORMAL HIGH (ref 8–23)
CO2: 27 mmol/L (ref 22–32)
Calcium: 8.7 mg/dL — ABNORMAL LOW (ref 8.9–10.3)
Chloride: 99 mmol/L (ref 98–111)
Creatinine, Ser: 2.33 mg/dL — ABNORMAL HIGH (ref 0.61–1.24)
GFR, Estimated: 28 mL/min — ABNORMAL LOW (ref >60)
Glucose, Bld: 143 mg/dL — ABNORMAL HIGH (ref 70–99)
Potassium: 4.3 mmol/L (ref 3.5–5.1)
Sodium: 135 mmol/L (ref 135–145)

## 2022-12-08 LAB — BRAIN NATRIURETIC PEPTIDE: B Natriuretic Peptide: 723.4 pg/mL — ABNORMAL HIGH (ref 0.0–100.0)

## 2022-12-08 LAB — CBC
HCT: 37.6 % — ABNORMAL LOW (ref 39.0–52.0)
Hemoglobin: 12 g/dL — ABNORMAL LOW (ref 13.0–17.0)
MCH: 29.3 pg (ref 26.0–34.0)
MCHC: 31.9 g/dL (ref 30.0–36.0)
MCV: 91.9 fL (ref 80.0–100.0)
Platelets: 126 10*3/uL — ABNORMAL LOW (ref 150–400)
RBC: 4.09 MIL/uL — ABNORMAL LOW (ref 4.22–5.81)
RDW: 16.9 % — ABNORMAL HIGH (ref 11.5–15.5)
WBC: 7.8 10*3/uL (ref 4.0–10.5)
nRBC: 0 % (ref 0.0–0.2)

## 2022-12-08 LAB — TSH: TSH: 7.959 u[IU]/mL — ABNORMAL HIGH (ref 0.350–4.500)

## 2022-12-08 LAB — T4, FREE: Free T4: 1.11 ng/dL (ref 0.61–1.12)

## 2022-12-08 LAB — VITAMIN B12: Vitamin B-12: 588 pg/mL (ref 180–914)

## 2022-12-08 NOTE — Telephone Encounter (Signed)
INR from today called to our office from [employer]... 6.2. Will forward to our anticoag clinic.

## 2022-12-08 NOTE — Telephone Encounter (Signed)
Call to [facility] Triage regarding a critical INR lab result; was unable to speak to caller.    INR initially was NOT found in Epic.  Leadership consulted, and advised to message Laural Golden.  INR resulted for this Pt and was found to be 6.2.  Per leadership at [facility], communicate to [name].   Ann Charity fundraiser, received call from [employer] during my investigation.  Message already forwarded to anticoag clinic.  Message received from Ms. Candance RN, advised they have spoke to Pt and addressed concern already.  AntiCoag clinic aware.

## 2022-12-08 NOTE — Telephone Encounter (Signed)
[employer] is calling back to report out of range INR. Call transferred to triage.

## 2022-12-08 NOTE — Progress Notes (Addendum)
PCP: [name], MD Cardiology: Dr. Katrinka Blazing HF Cardiology: Dr. Shirlee Latch EP: Dr. Ladona Ridgel  82 y.o. with history of mechanical mitral valve s/p MV endocarditis, VT, nonischemic cardiomyopathy, complete heart block was referred by Dr. Katrinka Blazing for evaluation of CHF.  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 He subsequently developed a presumed nonischemic cardiomyopathy.  This was complicated by VT, he had a VT ablation in 2017 and had a Medtronic CRT-D device implanted. This had to be removed due to infection in 11/12 and was reimplanted in 1/13. Echo in 5/22 showed EF 30% with normal RV, mechanical MV stable with mean gradient 3 mmHg.  He has been in atrial fibrillation persistently since late 2021.  He is on amiodarone and has been cardioverted in the past. His LV lead is not functional.  He has complete heart block and is persistently RV pacing.   Had RHC 02/26/21 RA 4, PA 32/12, PCWP 11, CO 4.6, CI 2.2. Normal filling pressures and slightly low cardiac output.   Digoxin was stopped due to elevated level.   He saw Dr. Ladona Ridgel in 9/22 and was in slow VT in the office.  He was paced out of VT and amiodarone was increased to 200 mg daily.   He saw Dr. Ladona Ridgel, and placement of a left bundle or LV lead was decided to be too risky a procedure.  He was referred to Dr. Marquette Saa with the hope that he could have a VT ablation and get off amiodarone, which is likely playing a role in his symptoms.     S/p VT ablation with Dr. Marquette Saa 1/23.  Echo in 4/23 showed EF 20-25% with diffuse hypokinesis and septal-lateral dyssynchrony, mildly decreased RV systolic function, mechanical MV with normal function, dilated IVC.   He was seen in the office in 7/23, noted to be in atrial flutter in the atria and slow VT in the ventricles.  VT was pace terminated by Dr. Graciela Husbands and lower threshold set for ATP.  Later in 7/23, he had another VT episode and had an ICD discharge.  Quinidine  was then started by Dr. Ladona Ridgel.   Follow up 10/23, had runs of VT terminated by ATP. Stable NYHA III and volume stable by OptiVol.  Echo 3/24 showed EF 20-25%, mildly decreased RV systolic function, moderate TR, IVC dilated, stable mechanical mitral valve with mean gradient 4 mmHg.   He presents to clinic today as an add on. He contacted the clinic noting change in baseline functional status. Overall has grown progressively weaker over the last several months, legs tire out and has had some hear falls. Now walking with a walker. Also notes increased exertional fatigue and slight increase in exertional dyspnea, though wt and appetite has remained the same. Denies resting dyspnea and no CP.  In March, his PCP checked labs and noted that TSH was elevated at 10 and labs suggested anemia. Levothyroxine dose was increased to 88 mcg but he has not had any f/u labs since. BPs have been stable and c/w baseline. Was seen by Dr. Ladona Ridgel in EP clinic yesterday. EKG showed atrial tach w/ ventricular pacing. Attempts were made to pace out but could not. Dr. Ladona Ridgel increased his ATP and turned off the VT therapy in the slow VT zone. Continued on amiodarone and quinidine.   Today's EKG shows A-V dual paced rhythm w/ occasional PVCs, 67 bpm. Device interrogation shows frequent occurrences of AT/AF but of short durations, < 1 hr. Chronically V -  paced. Optivol fluid index and Impedence ok, suggestive of euvolemia. Gradual decline in activity level since January, < 1hr a day.     ECG (personally reviewed): A-V dual paced rhythm w/ occasional PVCs, 67 bpm. Labs (7/22): K 4.6, creatinine 1.3 Labs (8/22): K 4.4, creatinine 1.23 Labs (10/22): K 3.9, creatinine 1.36 Labs (12/22): K 4.2, creatinine 1.39 Labs (1/23): K 4.3, creatinine 1.15 Labs (2/23): TSH 10, K 4.1, creatinine 1.54 Labs (6/23): BNP 798, K 4.1, creatinine 1.3 Labs (7/23): BNP 1244, LFTs normal, K 4.2, creatinine 1.49 Labs (10/23): K 4, creatinine 1.72, TSH  5.7, transferrin saturation 28%, hgb 12.8, plts 128, LFTs normal Labs (3/24): K 4.9, creatinine 2.26, TSH 10.9, hgb 12.4 Labs (4/24): K 4.0, creatinine 2.88    PMH: 1. 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 2. H/o VT: VT ablation in 2017.  - Redo VT ablation in 1/23 with Dr. Marquette Saa - Arita Miss termination of VT in office in 7/23.  - ICD shock 7/23, started quinidine.  3. Atrial fibrillation/atypical flutter: Persistent since 2021.  Failed amiodarone.  4. PAD: Tibial occlusive disease.  5. 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  6. Hypothyroidism.  7. Chronic systolic CHF: Presumed nonischemic cardiomyopathy.  Medtronic CRT-D device, LV lead is nonfunctional.  - Echo (2012): EF 45% - Echo (1/17): EF 30-35% - Echo (6/21): EF 15-20% - Echo (11/21): EF 30-35%  - Echo (5/22): EF 30%, normal RV, mechanical mitral valve mean gradient 3 mmHg.  - RHC (8/22): RA mean 4, RV 33/4,PA 32/12 mean 21, PCWP mean 11, Oxygen saturations:PA 71%, AO 98%, Cardiac Output (Fick) 4.66 Cardiac Index (Fick) 2.18 - Echo (4/23): EF 20-25% with diffuse hypokinesis and septal-lateral dyssynchrony, mildly decreased RV systolic function, mechanical MV with normal function, dilated IVC.  - Echo (3/24): EF 20-25%, mildly decreased RV systolic function, moderate TR, IVC dilated, stable mechanical mitral valve with mean gradient 4 mmHg.   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  Has one daughter.  Retired.  Education: Masters.   Social Determinants of Health   Financial Resource Strain: Not on file  Food Insecurity: Not on file  Transportation Needs: Not on file  Physical Activity: Not on file  Stress: Not on file  Social Connections: Not on file  Intimate Partner Violence: Not on file   Family History  Problem Relation Age of Onset   Heart disease Mother    Heart failure Mother    Heart disease Father    Heart failure Father    Sleep apnea Neg Hx    ROS: All systems reviewed and negative except as per HPI.   Current Outpatient Medications  Medication Sig Dispense Refill   acetaminophen (TYLENOL) 500 MG tablet Take 1,000 mg by  mouth every 6 (six) hours as needed (pain).     amiodarone (PACERONE) 200 MG tablet Take 1 tablet (200 mg total) by mouth daily. Do NOT take Amiodarone on Wednesday or Sunday. 90 tablet 2   amoxicillin (AMOXIL) 500 MG capsule TAKE 4 CAPSULES BY MOUTH 30 MINUTES PRIOR TO DENTAL WORK 4 capsule 2   cefadroxil (DURICEF) 500 MG capsule Take 1 capsule (500 mg total) by mouth 2 (two) times daily. 20 capsule 0   furosemide (LASIX) 20 MG tablet Take 3 tablets (60 mg total) by mouth 2 (two) times daily. 180 tablet 8   levothyroxine (SYNTHROID) 88 MCG tablet Take 1 tablet (88 mcg total) by mouth daily. 90 tablet 0   melatonin 5 MG TABS 5 mg at bedtime as needed.     metoprolol succinate (TOPROL-XL) 25 MG 24 hr tablet Take 1 tablet (25 mg total) by mouth daily. 90 tablet 3   Multiple Vitamin (MULTIVITAMIN WITH MINERALS) TABS tablet Take 1 tablet by mouth in the morning.     mupirocin ointment (BACTROBAN) 2 % Apply 1 Application topically daily. 30 g 2   nitroGLYCERIN (NITROSTAT) 0.4 MG SL tablet Place 1 tablet (0.4 mg total) under the tongue every 5 (five) minutes x 3 doses as needed for chest pain. 25 tablet 2   quiniDINE gluconate 324  MG CR tablet Take 1 tablet (324 mg total) by mouth 2 (two) times daily. 60 tablet 11   spironolactone (ALDACTONE) 25 MG tablet TAKE 1/2 TABLET BY MOUTH EVERY DAY 45 tablet 3   valsartan (DIOVAN) 40 MG tablet Take 20 mg by mouth daily.     Vericiguat (VERQUVO) 10 MG TABS Take 10 mg by mouth daily. 30 tablet 11   warfarin (COUMADIN) 5 MG tablet TAKE AS DIRECTED BY THE COUMADIN CLINIC 80 tablet 1   zinc gluconate 50 MG tablet Take 1 tablet (50 mg total) by mouth daily. 90 tablet 1   No current facility-administered medications for this encounter.   BP 118/70   Pulse 85   Wt 93.3 kg (205 lb 9.6 oz)   SpO2 96%   BMI 26.76 kg/m   Wt Readings from Last 3 Encounters:  12/08/22 93.3 kg (205 lb 9.6 oz)  12/07/22 92.7 kg (204 lb 6.4 oz)  10/29/22 92.9 kg (204 lb 12.8 oz)   PHYSICAL EXAM: General: elderly WM No respiratory difficulty HEENT: normal Neck: supple. no JVD. Carotids 2+ bilat; no bruits. No lymphadenopathy or thyromegaly appreciated. Cor: PMI nondisplaced. Regular rate & rhythm. No rubs, gallops or murmurs. Lungs: clear Abdomen: soft, nontender, nondistended. No hepatosplenomegaly. No bruits or masses. Good bowel sounds. Extremities: no cyanosis, clubbing, rash, trace pretibial edema, muscle atrophy bilateral quadriceps  Neuro: alert & oriented x 3, cranial nerves grossly intact. moves all 4 extremities w/o difficulty. Affect pleasant.   Assessment/Plan: 1. Fatigue/ Weakness/ Decreased Exercise Tolerance  - etiology uncertain. Differential broad. While he endorses generalized weakness he also notes some localized muscle weakness, which may be related to age/deconditioning. Bilateral quadriceps atrophy noted on exam.  - will check a panel of labs including CMP, CBC, Iron, Ferritin, TIBC, B-12, TSH, Free T3/T4 and CK level  - this may also be 2/2 progression of HF. He is on the maximum tolerated GDMT, limited by renal function and soft BP. He has a CRT-D device however his LV lead is  not functional. This is not ideal and creates dyssynchrony and may be contributing. Unfortunately, he is felt too high risk for system revision.  His interrogation today shows frequent occurrences of AT/AF but of short durations, < 1 hr but would not expect these brief paroxysmal runs to contribute to chronic fatigue. We discussed other devices such as Barostim therapy and he is interested in this. Despite being 82 y/o, he had previously been very functional and would like to return to previous QOL. Given age and renal fx, he is not an ideal candidate for LVAD. Will check pro-BNP and discuss candidacy for Barostim further w/ Otilio Saber EP PA.  - as noted above, may be 2/2 age and deconditioning and may benefit from PT. If above w/u returns unremarkable, will plan to refer next visit   2. Chronic Systolic HF : Long-standing, suspected nonischemic cardiomyopathy.  Had RHC on 02/26/21 with normal filling pressures and mildly low cardiac output.  Echo in 4/23 showed  EF 20-25% with diffuse hypokinesis and septal-lateral dyssynchrony, mildly decreased RV systolic function, mechanical MV with normal function, dilated IVC.  He has a Medtronic CRT-D device present, but the LV lead is not functional.  He has complete heart block, so has been RV paced chronically.  NYHA class III-IIIb, worse more recently (see above). Euvolemic by exam and by OptiVol. - Continue Lasix 60 mg qam and 40 mg qpm  - Continue spironolactone 12.5 mg qhs, will not increase due to soft BP and higher creatinine.  - With orthostatic symptoms, he is now off Entresto and on valsartan 40 mg q qhs.   - Continue Toprol XL 25 mg qhs.  - Continue Verquvo 10 mg daily.  - Off digoxin due to persistently elevated level.  - The patient has CHB and has been chronically RV pacing as his LV lead is nonfunctional (when not in VT).  This is not ideal and creates dyssynchrony.  Remains symptomatic with limited ability to titrate meds. He has seen Dr. Ladona Ridgel, it  would be possible to place left bundle lead but procedure would be complicated.  After further discussion with Dr. Ladona Ridgel, it was decided that risk would be too high and left bundle lead will not be attempted.   - We discussed baroreceptor activation therapy again today and he is interested. Will refer to Otilio Saber   2. Atrial fibrillation/flutter: Paroxysmal. Brief episodes on device interrogation < 1/hr duration  - He is on amiodarone 200 mg daily. He is on Levoxyl for hypothyroidism. Will check TFTs and HFTs today  - Continue Warfarin. INR followed by coumadin clinic. Denies gross bleeding but given recent symptoms will check CBC and Anemia panel today  3. Hypothyroidism - on levothyroxine. Dose was increased to 88 mcg 3/24 given elevated TSH level of 10.6 - will repeat TSH and Free T3/T4 level today    F/u w/ APP or Dr. Shirlee Latch in 3 wks   Robbie Lis, PA-C  12/08/2022

## 2022-12-08 NOTE — Patient Instructions (Addendum)
Description   Spoke with pt and instructed to not take any Warfarin today and no Warfarin tomorrow then START taking warfarin 1/2 tablet daily except for 1 tablet on Mondays and Fridays.  Recheck INR in 1 week (normally 2 weeks).  Coumadin Clinic [phone].  AMIO DOSE 200MG  DAILY, 12/07/22 12/08/22-Amio 200mg  qd none Wed/Sun   Pt's cell phone [phone] in case difficulty reaching pt on home phone.

## 2022-12-08 NOTE — Telephone Encounter (Signed)
Please refer to Anticoagulation Encounter for details regarding this INR result.

## 2022-12-08 NOTE — Patient Instructions (Signed)
Medication Changes:  We recommend that you continue on your current medications as directed. Please refer to the Current Medication list given to you today.   *If you need a refill on your cardiac medications before your next appointment, please call your pharmacy*  Lab Work:  Labs done today, your results will be available in MyChart, we will contact you for abnormal readings.   Follow-Up in:   Your physician recommends that you schedule a follow-up appointment in: 3 weeks   Do the following things EVERYDAY: Weigh yourself in the morning before breakfast. Write it down and keep it in a log. Take your medicines as prescribed Eat low salt foods--Limit salt (sodium) to 2000 mg per day.  Stay as active as you can everyday Limit all fluids for the day to less than 2 liters    Need to Contact us:  If you have any questions or concerns before your next appointment please send [place] a message through [place] or call our office at [phone].    TO LEAVE A MESSAGE FOR THE NURSE SELECT OPTION 2, PLEASE LEAVE A MESSAGE INCLUDING: YOUR NAME DATE OF BIRTH CALL BACK NUMBER REASON FOR CALL**this is important as we prioritize the call backs  YOU WILL RECEIVE A CALL BACK THE SAME DAY AS LONG AS YOU CALL BEFORE 4:00 PM   At the [facility], you and your health needs are our priority. As part of our continuing mission to provide you with exceptional heart care, we have created designated Provider Care Teams. These Care Teams include your primary Cardiologist (physician) and Advanced Practice Providers (APPs- Physician Assistants and Nurse Practitioners) who all work together to provide you with the care you need, when you need it.   You may see any of the following providers on your designated Care Team at your next follow up: Dr Arvilla Meres Dr Marca Ancona Dr. Marcos Eke, NP Robbie Lis, [place] [facility] [place], [place] Brynda Peon,  NP Karle Plumber, PharmD   Please be sure to bring in all your medications bottles to every appointment.    Thank you for choosing [facility]

## 2022-12-08 NOTE — Telephone Encounter (Signed)
Critical INR results, Caller hung up while waiting to be transferred.

## 2022-12-08 NOTE — Addendum Note (Signed)
Encounter addended by: Allayne Butcher, PA-C on: 12/08/2022 5:41 PM  Actions taken: Clinical Note Signed

## 2022-12-09 LAB — MISC LABCORP TEST (SEND OUT): Labcorp test code: 143000

## 2022-12-09 LAB — T3, FREE: T3, Free: 1.4 pg/mL — ABNORMAL LOW (ref 2.0–4.4)

## 2022-12-10 ENCOUNTER — Telehealth: Payer: Self-pay | Admitting: *Deleted

## 2022-12-10 ENCOUNTER — Other Ambulatory Visit: Payer: Self-pay | Admitting: *Deleted

## 2022-12-10 DIAGNOSIS — I5022 Chronic systolic (congestive) heart failure: Secondary | ICD-10-CM

## 2022-12-10 NOTE — Telephone Encounter (Signed)
-----   Message from Graciella Freer, PA-C sent at 12/08/2022  5:56 PM EDT ----- Regarding: RE: Happy to see him!  His BNP is >700 and the test we have to order is an NT-Pro BNP which is usually 2-3 times greater. (And has to be <1600 to qualify)  With all the VT and AT he has going on I think the likelihood he benefits is pretty low.   Saylah Ketner, can we please schedule him to swing by the office and get an NT-Pro BNP sometime in the next couple of weeks for CHF, and [place] I can follow up from there. ----- Message ----- From: Allayne Butcher, PA-C Sent: 12/08/2022   5:43 PM EDT To: Graciella Freer, PA-C  Hey, pt is interested in barostim. Can you look at chart to see if you think he would be a candidate? I ordered a pro-BNP .  Thanks, [employer]

## 2022-12-10 NOTE — Telephone Encounter (Signed)
-----   Message from Michael Andrew Tillery, PA-C sent at 12/08/2022  5:56 PM EDT ----- Regarding: RE: Happy to see him!  His BNP is >700 and the test we have to order is an NT-Pro BNP which is usually 2-3 times greater. (And has to be <1600 to qualify)  With all the VT and AT he has going on I think the likelihood he benefits is pretty low.   Esiah Bazinet, can we please schedule him to swing by the office and get an NT-Pro BNP sometime in the next couple of weeks for CHF, and [place] I can follow up from there. ----- Message ----- From: Simmons, Brittainy M, PA-C Sent: 12/08/2022   5:43 PM EDT To: Michael Andrew Tillery, PA-C  Hey, pt is interested in barostim. Can you look at chart to see if you think he would be a candidate? I ordered a pro-BNP .  Thanks, Brittainy 

## 2022-12-10 NOTE — Telephone Encounter (Signed)
Can someone please assist with ordering this? I put in the order, but it only shows [place] a [facility] at the resulting agency and not [facility]. Patient would like to have this drawn on 12/16/22. Please advise.  Thanks,  Trentyn Boisclair

## 2022-12-13 ENCOUNTER — Encounter (HOSPITAL_COMMUNITY): Payer: Self-pay | Admitting: Cardiology

## 2022-12-13 ENCOUNTER — Other Ambulatory Visit: Payer: Self-pay | Admitting: Student

## 2022-12-13 ENCOUNTER — Ambulatory Visit: Payer: Medicare Other | Attending: Internal Medicine

## 2022-12-13 ENCOUNTER — Telehealth (HOSPITAL_COMMUNITY): Payer: Self-pay

## 2022-12-13 DIAGNOSIS — Z95810 Presence of automatic (implantable) cardiac defibrillator: Secondary | ICD-10-CM

## 2022-12-13 DIAGNOSIS — I5022 Chronic systolic (congestive) heart failure: Secondary | ICD-10-CM

## 2022-12-13 NOTE — Telephone Encounter (Signed)
-----   Message from Allayne Butcher, [place] sent at 12/09/2022 12:31 PM EDT ----- HF fluid markers are elevated. He needs to increase Lasix back to 60 mg bid (recently reduced to 60/40). Needs f/u BMP and BNP in 1 wk.   Thyroid labs improving. Needs to f/u w/ PCP  Keep HF clinic f/u in several wks

## 2022-12-13 NOTE — Telephone Encounter (Signed)
Spoke with patient, he requested lab appt for 12/16/22. He understands he is to come by our office for labs on 5/23 between 7:30am and 4:00pm.  Per Otilio Saber, PA-C:  It's the PRO BNP.   You don't have to add the NT and I think that was causing the hang up.   Pro-BNP ordered, lab appt scheduled.  Patient verbalized understanding and expressed appreciation for call.

## 2022-12-13 NOTE — Progress Notes (Signed)
Completed.

## 2022-12-13 NOTE — Telephone Encounter (Signed)
Patient aware of labs and increase in Lasix. He is having labs later this week at Cardiology.

## 2022-12-15 ENCOUNTER — Ambulatory Visit (INDEPENDENT_AMBULATORY_CARE_PROVIDER_SITE_OTHER): Payer: Medicare Other | Admitting: Neurology

## 2022-12-15 ENCOUNTER — Telehealth: Payer: Self-pay | Admitting: *Deleted

## 2022-12-15 DIAGNOSIS — G4731 Primary central sleep apnea: Secondary | ICD-10-CM

## 2022-12-15 DIAGNOSIS — G4733 Obstructive sleep apnea (adult) (pediatric): Secondary | ICD-10-CM

## 2022-12-15 DIAGNOSIS — R351 Nocturia: Secondary | ICD-10-CM

## 2022-12-15 DIAGNOSIS — G4734 Idiopathic sleep related nonobstructive alveolar hypoventilation: Secondary | ICD-10-CM

## 2022-12-15 DIAGNOSIS — I428 Other cardiomyopathies: Secondary | ICD-10-CM

## 2022-12-15 DIAGNOSIS — G4719 Other hypersomnia: Secondary | ICD-10-CM

## 2022-12-15 NOTE — Telephone Encounter (Signed)
Called pt since INR is due. There was no answer so left a message. Will await a call back/follow up.

## 2022-12-15 NOTE — Telephone Encounter (Signed)
Called pt since INR is due; he states he has had issues with getting blood out his fingers today and has wasted several strips. He states he will try it tomorrow. Will follow up with him tomorrow.

## 2022-12-15 NOTE — Progress Notes (Signed)
EPIC Encounter for ICM Monitoring  Patient Name: Austin [name] is a 82 y.o. male Date: 12/15/2022 Primary Care Physican: [name], MD Primary Cardiologist: Smith/McLean Electrophysiologist: Ladona Ridgel 08/10/2022 Weight: 202 lbs 09/01/2022   Weight: 202 lbs 09/28/2022   Weight: 200 lbs 11/09/2022 Weight: 192.7 lbs   Since 08-Dec-2022 AT/AF  17 Time in AT/AF  0.2 hr/day (0.7%) Longest AT/AF  4 minutes               Spoke with patient and heart failure questions reviewed.  Transmission results reviewed.  Pt reports feeling well at this time and voices no complaints.     Optivol Thoracic impedance suggesting possible fluid accumulation from 4/29-5/19 which correlates with decrease in Furosemide dosage during that time and returning to normal 5/20.   Prescribed:  Furosemide 20 mg Take 3 tablets (60 mg total) by mouth twice a day.   He self adjusts Furosemide when needed.  Spironolactone 25 mg take 0.5 tablet by mouth daily   Labs: 12/08/2022 Creatinine 2.33, BUN 42, Potassium 4.3, Sodium 135, GFR 28 11/18/2022 Creatinine 2.88, BUN 62, Potassium 4.0, Sodium 136, GFR 21 11/08/2022 Creatinine 2.46, BUN 55, Potassium 4.1, Sodium 134, GFR 26, BNP 528.8 10/19/2022 Creatinine 2.08, BUN 47, Potassium 4.8, Sodium 136, GFR 32  10/08/2022 Creatinine 2.48, BUN 47, Potassium 3.7, Sodium 135, GFR 26  09/27/2022 Creatinine 2.26, BUN 53, Potassium 4.9, Sodium 138, GFR 26.83 A complete set of results can be found in Results Review.   Recommendations: Encouraged to call if experiencing any fluid symptoms.    Follow-up plan: ICM clinic phone appointment on 12/28/2022 to check stability of fluid levels.   91 day device clinic remote transmission 12/22/2022.      EP/Cardiology Office Visits:  12/31/2022 with HF clinic.  Recall 01/27/2023 with Dr Shirlee Latch.  06/01/2023 with Dr Ladona Ridgel.     Copy of ICM check sent to Dr. Ladona Ridgel.    3 month ICM trend: 12/13/2022.    12-14 Month ICM trend:     Karie Soda,  RN 12/15/2022 1:39 PM

## 2022-12-16 ENCOUNTER — Ambulatory Visit: Payer: Medicare Other | Attending: Internal Medicine

## 2022-12-16 ENCOUNTER — Telehealth: Payer: Self-pay | Admitting: *Deleted

## 2022-12-16 DIAGNOSIS — I4891 Unspecified atrial fibrillation: Secondary | ICD-10-CM

## 2022-12-16 DIAGNOSIS — I5022 Chronic systolic (congestive) heart failure: Secondary | ICD-10-CM

## 2022-12-16 NOTE — Telephone Encounter (Signed)
Called pt since INR overdue. He states his poc machine is giving issues and he can't get it to work and thinks it is a bad batch of strips again. Advised his last INR on 12/08/22 was 6.2 and we need to ensure we get this lab done. Offered him to come to one of our offices for a lab draw and he states he will come to [facility] location tomorrow. Placed lab order. Will await and follow up.

## 2022-12-16 NOTE — Addendum Note (Signed)
Addended by: Huston Foley on: 12/16/2022 05:07 PM   Modules accepted: Orders

## 2022-12-16 NOTE — Progress Notes (Signed)
See procedure note.

## 2022-12-16 NOTE — Procedures (Signed)
[facility]  HOME SLEEP TEST (Watch PAT) REPORT  STUDY DATE: 12/15/22  DOB: 10/13/1940  MRN: [id number]  ORDERING CLINICIAN: Huston Foley, MD, PhD   REFERRING CLINICIAN: [name], MD   CLINICAL INFORMATION/HISTORY: 82 year old male with an underlying complex medical history of atrial fibrillation, status post AICD placement, nonischemic cardiomyopathy, status post mitral valve replacement, syncope, history of V. tach, history of intraspinal abscess, hearing loss, edema, hypothyroidism, anemia, and overweight state, who presents for re-evaluation of his sleep apnea. He is currently on autoPAP of 4-16 cm with moderate central events noted on his recent compliance report.   Epworth sleepiness score: 13/24.  BMI: 27.8 kg/m  FINDINGS:   Sleep Summary:   Total Recording Time (hours, min): 7 hours, 58 min  Total Sleep Time (hours, min):  7 hours, 22 min  Percent REM (%):    20.9%   Respiratory Indices:   Calculated pAHI (per hour):  53.3/hour         REM pAHI:    53.2/hour       NREM pAHI: 53.3/hour  Central pAHI: 31.9/hour  Oxygen Saturation Statistics:    Oxygen Saturation (%) Mean: 88%   Minimum oxygen saturation (%):                 68%   O2 Saturation Range (%): 68 - 96%    O2 Saturation (minutes) <=88%: 186.3 min  Pulse Rate Statistics:   Pulse Mean (bpm):    65/min    Pulse Range (50 - 127/min)   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 His average oxygen saturation was only 88%. Moderate to loud snoring was detected consistently throughout the study.  Ongoing treatment with positive airway pressure is highly recommended. The patient will be advised to return for a full night PAP titration study for proper treatment settings, O2 monitoring and mask fitting. He may need BiPAP therapy or BiPAP ST. For now, the patient will be advised to continue with his current autoPAP machine. Alternative treatment options are limited secondary to the severity of the patient's sleep disordered breathing, and primary central sleep apnea. Concomitant weight loss is recommended (where clinically appropriate). Please note, that untreated obstructive sleep apnea may carry additional perioperative morbidity. Patients with significant obstructive sleep apnea should receive perioperative PAP therapy and the surgeons and particularly the anesthesiologist should be informed of the diagnosis and the severity of the sleep disordered breathing. The patient should be cautioned not to drive, work at heights, or operate dangerous or heavy equipment when tired or sleepy. Review and reiteration of good sleep hygiene measures should be pursued with any patient. Other causes of the patient's symptoms, including circadian rhythm disturbances, an underlying mood disorder, medication effect and/or an underlying medical problem cannot be ruled out based on this test. Clinical correlation is recommended.  The patient and his referring provider will be notified of the test results. The patient will be seen in follow up in sleep clinic at [facility].  I certify that I have reviewed the raw data recording prior to the issuance of this report in accordance with the standards of the American Academy of Sleep Medicine (AASM).  INTERPRETING PHYSICIAN:   Huston Foley, MD, PhD Medical Director, [facility]) Diplomat, ABPN (Neurology and Sleep)   [facility] [address], [address] [place], [place] [address] [phone]

## 2022-12-17 ENCOUNTER — Other Ambulatory Visit (HOSPITAL_COMMUNITY): Payer: Self-pay

## 2022-12-17 ENCOUNTER — Ambulatory Visit (INDEPENDENT_AMBULATORY_CARE_PROVIDER_SITE_OTHER): Payer: Medicare Other | Admitting: *Deleted

## 2022-12-17 ENCOUNTER — Encounter (HOSPITAL_COMMUNITY): Payer: Self-pay

## 2022-12-17 DIAGNOSIS — Z5181 Encounter for therapeutic drug level monitoring: Secondary | ICD-10-CM

## 2022-12-17 DIAGNOSIS — I4891 Unspecified atrial fibrillation: Secondary | ICD-10-CM

## 2022-12-17 DIAGNOSIS — I5022 Chronic systolic (congestive) heart failure: Secondary | ICD-10-CM

## 2022-12-17 LAB — PRO B NATRIURETIC PEPTIDE: NT-Pro BNP: 3692 pg/mL — ABNORMAL HIGH (ref 0–486)

## 2022-12-17 LAB — PROTIME-INR
INR: 2.5 — ABNORMAL HIGH (ref 0.9–1.2)
Prothrombin Time: 24.1 s — ABNORMAL HIGH (ref 9.1–12.0)

## 2022-12-17 NOTE — Patient Instructions (Signed)
Description   INR reported 2.5 from [facility] with pt and instructed to take 1.5 tablets of warfarin today then CONTINUE taking warfarin 1/2 tablet daily except for 1 tablet on Mondays and Fridays.  Recheck INR in 1 week (normally 2 weeks).  Coumadin Clinic [phone].  AMIO DOSE 200MG  DAILY, 12/07/22 12/08/22-Amio 200mg  qd none Wed/Sun   Pt's cell phone [phone] in case difficulty reaching pt on home phone.

## 2022-12-21 ENCOUNTER — Telehealth: Payer: Self-pay | Admitting: Neurology

## 2022-12-21 NOTE — Telephone Encounter (Signed)
12/21/22 LVM [place] 12/21/22 [employer] medicare no auth req EE

## 2022-12-21 NOTE — Progress Notes (Signed)
Recommended is below 1600

## 2022-12-22 ENCOUNTER — Ambulatory Visit (INDEPENDENT_AMBULATORY_CARE_PROVIDER_SITE_OTHER): Payer: Medicare Other

## 2022-12-22 ENCOUNTER — Telehealth: Payer: Self-pay | Admitting: *Deleted

## 2022-12-22 DIAGNOSIS — I5022 Chronic systolic (congestive) heart failure: Secondary | ICD-10-CM

## 2022-12-22 DIAGNOSIS — G4731 Primary central sleep apnea: Secondary | ICD-10-CM

## 2022-12-22 DIAGNOSIS — I428 Other cardiomyopathies: Secondary | ICD-10-CM

## 2022-12-22 DIAGNOSIS — I442 Atrioventricular block, complete: Secondary | ICD-10-CM | POA: Diagnosis not present

## 2022-12-22 DIAGNOSIS — Z9989 Dependence on other enabling machines and devices: Secondary | ICD-10-CM

## 2022-12-22 DIAGNOSIS — Z95810 Presence of automatic (implantable) cardiac defibrillator: Secondary | ICD-10-CM

## 2022-12-22 DIAGNOSIS — G4734 Idiopathic sleep related nonobstructive alveolar hypoventilation: Secondary | ICD-10-CM

## 2022-12-22 DIAGNOSIS — G4733 Obstructive sleep apnea (adult) (pediatric): Secondary | ICD-10-CM

## 2022-12-22 DIAGNOSIS — I4891 Unspecified atrial fibrillation: Secondary | ICD-10-CM

## 2022-12-22 DIAGNOSIS — Z952 Presence of prosthetic heart valve: Secondary | ICD-10-CM

## 2022-12-22 LAB — CUP PACEART REMOTE DEVICE CHECK
Battery Remaining Longevity: 24 mo
Battery Voltage: 2.93 V
Brady Statistic AP VP Percent: 42.93 %
Brady Statistic AP VS Percent: 0.59 %
Brady Statistic AS VP Percent: 54.45 %
Brady Statistic AS VS Percent: 2.03 %
Brady Statistic RA Percent Paced: 34.88 %
Brady Statistic RV Percent Paced: 97.35 %
Date Time Interrogation Session: 20240529073826
HighPow Impedance: 71 Ohm
Implantable Lead Connection Status: 753985
Implantable Lead Connection Status: 753985
Implantable Lead Implant Date: 20130110
Implantable Lead Implant Date: 20130110
Implantable Lead Location: 753859
Implantable Lead Location: 753860
Implantable Lead Model: 5076
Implantable Lead Model: 6935
Implantable Pulse Generator Implant Date: 20181010
Lead Channel Impedance Value: 285 Ohm
Lead Channel Impedance Value: 361 Ohm
Lead Channel Impedance Value: 361 Ohm
Lead Channel Pacing Threshold Amplitude: 1.125 V
Lead Channel Pacing Threshold Pulse Width: 0.4 ms
Lead Channel Sensing Intrinsic Amplitude: 0.75 mV
Lead Channel Sensing Intrinsic Amplitude: 0.75 mV
Lead Channel Sensing Intrinsic Amplitude: 5.125 mV
Lead Channel Sensing Intrinsic Amplitude: 5.125 mV
Lead Channel Setting Pacing Amplitude: 2.5 V
Lead Channel Setting Pacing Amplitude: 2.5 V
Lead Channel Setting Pacing Pulse Width: 0.4 ms
Lead Channel Setting Sensing Sensitivity: 0.3 mV
Zone Setting Status: 755011

## 2022-12-22 NOTE — Progress Notes (Signed)
EPIC Encounter for ICM Monitoring  Patient Name: Austin [name] is a 82 y.o. male Date: 12/22/2022 Primary Care Physican: [name], MD Primary Cardiologist: Smith/McLean Electrophysiologist: Ladona Ridgel 08/10/2022 Weight: 202 lbs 09/01/2022   Weight: 202 lbs 09/28/2022   Weight: 200 lbs 11/09/2022 Weight: 192.7 lbs 12/22/2022 Weight: 191 lbs   Since 13-Dec-2022 AT/AF  51 Time in AT/AF  0.3 hr/day (1.2%) Longest AT/AF  7 minutes               Spoke with patient and heart failure questions reviewed.  Transmission results reviewed.  Pt reports feeling well at this time and voices no complaints.     Optivol Thoracic impedance suggesting fluid levels remain stable since 5/20.   Prescribed:  Furosemide 20 mg Take 3 tablets (60 mg total) by mouth twice a day.   He self adjusts Furosemide when needed.  Spironolactone 25 mg take 0.5 tablet by mouth daily   Labs: 12/08/2022 Creatinine 2.33, BUN 42, Potassium 4.3, Sodium 135, GFR 28 11/18/2022 Creatinine 2.88, BUN 62, Potassium 4.0, Sodium 136, GFR 21 11/08/2022 Creatinine 2.46, BUN 55, Potassium 4.1, Sodium 134, GFR 26, BNP 528.8 10/19/2022 Creatinine 2.08, BUN 47, Potassium 4.8, Sodium 136, GFR 32  10/08/2022 Creatinine 2.48, BUN 47, Potassium 3.7, Sodium 135, GFR 26  09/27/2022 Creatinine 2.26, BUN 53, Potassium 4.9, Sodium 138, GFR 26.83 A complete set of results can be found in Results Review.   Recommendations: Encouraged to call if experiencing any fluid symptoms.    Follow-up plan: ICM clinic phone appointment on 01/17/2023.   91 day device clinic remote transmission 03/23/2023.      EP/Cardiology Office Visits:    Recall 01/27/2023 with Dr Shirlee Latch.  06/01/2023 with Dr Ladona Ridgel.     Copy of ICM check sent to Dr. Ladona Ridgel.    3 month ICM trend: 12/22/2022.    12-14 Month ICM trend:     Karie Soda, RN 12/22/2022 12:20 PM

## 2022-12-22 NOTE — Telephone Encounter (Signed)
-----   Message from Huston Foley, MD sent at 12/16/2022  5:07 PM EDT ----- Patient referred by PCP for evaluation of his obstructive sleep apnea.  He has been on AutoPap therapy for years.  I saw him on 10/20/2022 and he had a home sleep test on 12/15/2022.     Please call and notify the patient that the recent home sleep test showed severe sleep apnea, as discussed, he has central as well as obstructive sleep apnea and may need a different type of machine.  For now, he can continue using his AutoPap machine at home, I would like to arrange for an in lab titration study so we can come back and stay overnight.  We may have to try him on CPAP or another machine called BiPAP therapy that night.  I would like to go ahead and get insurance authorization for this test.  Huston Foley, MD, PhD [facility] ([facility]

## 2022-12-22 NOTE — Telephone Encounter (Signed)
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 We can try and see if insurance would cover an auto BiPAP machine for him (without [place] doing another sleep study).  If he is agreeable, I will send in an order for auto BiPAP therapy.  Please discussed with patient, order placed in chart for BiPAP machine.

## 2022-12-22 NOTE — Telephone Encounter (Signed)
I called pt and relayed the results of his sleep test below.  He will continue with using his autopap.  Initially he was ok to use another machine, but after mentioning needing another in lab test to evaluate for cpap or bipap, he said that he was not going to do another sleep test at his 80 yrs of age.  I told him that I would relay to Dr. Frances Furbish and see what she recommended due to his refusal to do test, even though this is needed to see what kind of machine would work best for him.

## 2022-12-23 ENCOUNTER — Ambulatory Visit (INDEPENDENT_AMBULATORY_CARE_PROVIDER_SITE_OTHER): Payer: Medicare Other

## 2022-12-23 DIAGNOSIS — Z5181 Encounter for therapeutic drug level monitoring: Secondary | ICD-10-CM | POA: Diagnosis not present

## 2022-12-23 DIAGNOSIS — I4891 Unspecified atrial fibrillation: Secondary | ICD-10-CM | POA: Diagnosis not present

## 2022-12-23 LAB — POCT INR: INR: 3 (ref 2.0–3.0)

## 2022-12-23 NOTE — Telephone Encounter (Signed)
I called pt and relayed the message from Dr. Frances Furbish per below.  Pt is agreeable to see if insurance will approve. Autobipap machine without the sleep study.  He appreciated this.  DME is [facility].

## 2022-12-23 NOTE — Telephone Encounter (Signed)
Bipap order sent to DME.

## 2022-12-23 NOTE — Telephone Encounter (Signed)
Please send order for autoBiPAP to DME. Once patient has a machine, he will need a FU in 60-90 days of set up.

## 2022-12-23 NOTE — Patient Instructions (Signed)
Description   Spoke with pt and instructed to continue taking warfarin 1/2 tablet daily except for 1 tablet on Mondays and Fridays.  Recheck INR in 1 week. (usually 2 weeks)  Coumadin Clinic [phone].  AMIO DOSE 200MG  DAILY, 12/07/22 12/08/22-Amio 200mg  qd none Wed/Sun   Pt's cell phone [phone] in case difficulty reaching pt on home phone.

## 2022-12-24 ENCOUNTER — Other Ambulatory Visit: Payer: Self-pay | Admitting: Internal Medicine

## 2022-12-24 DIAGNOSIS — E032 Hypothyroidism due to medicaments and other exogenous substances: Secondary | ICD-10-CM

## 2022-12-27 ENCOUNTER — Encounter: Payer: Self-pay | Admitting: Internal Medicine

## 2022-12-27 NOTE — Telephone Encounter (Signed)
[facility] confirmed receipt of order.  

## 2022-12-29 NOTE — Progress Notes (Signed)
PCP: [name], MD Cardiology: Dr. Katrinka Blazing HF Cardiology: Dr. Shirlee Latch EP: Dr. Ladona Ridgel  82 y.o. with history of mechanical mitral valve s/p MV endocarditis, VT, nonischemic cardiomyopathy, complete heart block was referred by Dr. Katrinka Blazing for evaluation of CHF.  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 He subsequently developed a presumed nonischemic cardiomyopathy.  This was complicated by VT, he had a VT ablation in 2017 and had a Medtronic CRT-D device implanted. This had to be removed due to infection in 11/12 and was reimplanted in 1/13. Echo in 5/22 showed EF 30% with normal RV, mechanical MV stable with mean gradient 3 mmHg.  He has been in atrial fibrillation persistently since late 2021.  He is on amiodarone and has been cardioverted in the past. His LV lead is not functional.  He has complete heart block and is persistently RV pacing.   Had RHC 02/26/21 RA 4, PA 32/12, PCWP 11, CO 4.6, CI 2.2. Normal filling pressures and slightly low cardiac output.   Digoxin was stopped due to elevated level.   He saw Dr. Ladona Ridgel in 9/22 and was in slow VT in the office.  He was paced out of VT and amiodarone was increased to 200 mg daily.   He saw Dr. Ladona Ridgel, and placement of a left bundle or LV lead was decided to be too risky a procedure.  He was referred to Dr. Marquette Saa with the hope that he could have a VT ablation and get off amiodarone, which is likely playing a role in his symptoms.     S/p VT ablation with Dr. Marquette Saa 1/23.  Echo in 4/23 showed EF 20-25% with diffuse hypokinesis and septal-lateral dyssynchrony, mildly decreased RV systolic function, mechanical MV with normal function, dilated IVC.   He was seen in the office in 7/23, noted to be in atrial flutter in the atria and slow VT in the ventricles.  VT was pace terminated by Dr. Graciela Husbands and lower threshold set for ATP.  Later in 7/23, he had another VT episode and had an ICD discharge.  Quinidine  was then started by Dr. Ladona Ridgel.   Follow up 10/23, had runs of VT terminated by ATP. Stable NYHA III and volume stable by OptiVol.  Echo 3/24 showed EF 20-25%, mildly decreased RV systolic function, moderate TR, IVC dilated, stable mechanical mitral valve with mean gradient 4 mmHg.   Follow up with EP 12/07/22, EKG showed atrial tach w/ ventricular pacing. Attempts were made to pace out but could not. Dr. Ladona Ridgel increased his ATP and turned off the VT therapy in the slow VT zone. Continued on amiodarone and quinidine.   Acute visit 5/24 with complaints of increased weakness, fatigue and near falls. In March, his PCP checked labs and noted that TSH was elevated at 10 and labs suggested anemia. Device interrogation showed euvolemia, frequent occurrences of AT/AF but of short durations. There was discussion of Barostim candidacy but his pro-BNP was > 3692. Concern for general deconditioning vs end-stage HF.  Today he returns for HF follow up. Overall continues to feel poorly. He remains SOB and fatigued, can do basic ADLs but anything more, especially using upper arms, wipes him out. Dizziness is getting worse. BP at home 80s/50s. About a year ago he was able to go to the gym a couple times a week, now has noticed obvious functional decline. Denies abnormal bleeding, palpitations, CP, edema, or PND/Orthopnea. Appetite ok. No fever or chills. Weight at home 192-194  pounds. Taking all medications. Cares for his wife who is suffering from Parkinson's.  Device interrogation (personally reviewed from 12/22/22): OptiVol stable, short bursts of AF (longest lasting 7 mins)  ECG (personally reviewed): none ordered today.  Labs (7/22): K 4.6, creatinine 1.3 Labs (8/22): K 4.4, creatinine 1.23 Labs (10/22): K 3.9, creatinine 1.36 Labs (12/22): K 4.2, creatinine 1.39 Labs (1/23): K 4.3, creatinine 1.15 Labs (2/23): TSH 10, K 4.1, creatinine 1.54 Labs (6/23): BNP 798, K 4.1, creatinine 1.3 Labs (7/23): BNP  1244, LFTs normal, K 4.2, creatinine 1.49 Labs (10/23): K 4, creatinine 1.72, TSH 5.7, transferrin saturation 28%, hgb 12.8, plts 128, LFTs normal Labs (3/24): K 4.9, creatinine 2.26, TSH 10.9, hgb 12.4 Labs (4/24): K 4.0, creatinine 2.88  Labs (5/24): K 4.3, creatinine 2.33, TSH 7.9   PMH: 1. 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 2. H/o VT: VT ablation in 2017.  - Redo VT ablation in 1/23 with Dr. Marquette Saa - Arita Miss termination of VT in office in 7/23.  - ICD shock 7/23, started quinidine.  3. Atrial fibrillation/atypical flutter: Persistent since 2021.  Failed amiodarone.  4. PAD: Tibial occlusive disease.  5. 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  6. Hypothyroidism.  7. Chronic systolic CHF: Presumed nonischemic cardiomyopathy.  Medtronic CRT-D device, LV lead is nonfunctional.  - Echo (2012): EF 45% - Echo (1/17): EF 30-35% - Echo (6/21): EF 15-20% - Echo (11/21): EF 30-35%  - Echo (5/22): EF 30%, normal RV, mechanical mitral valve mean gradient 3 mmHg.  - RHC (8/22): RA mean 4, RV 33/4,PA 32/12 mean 21, PCWP mean 11, Oxygen saturations:PA 71%, AO 98%, Cardiac Output (Fick) 4.66 Cardiac Index (Fick) 2.18 - Echo (4/23): EF 20-25% with diffuse hypokinesis and septal-lateral dyssynchrony, mildly decreased RV systolic function, mechanical MV with normal function, dilated IVC.  - Echo (3/24): EF 20-25%, mildly decreased RV systolic function, moderate TR, IVC dilated, stable mechanical mitral valve with mean gradient 4 mmHg.   Social History   Socioeconomic History   Marital status: Married    Spouse name: Not on file   Number of children: 1   Years of education: 71   Highest education level: Master's degree (e.g., MA, MS, MEng, MEd, MSW, MBA)  Occupational History   Occupation: retired from [employer]  Tobacco Use   Smoking status: Never   Smokeless tobacco: Never  Vaping Use   Vaping Use: Never used  Substance and Sexual Activity   Alcohol  use: Yes    Alcohol/week: 0.0 standard drinks of alcohol    Comment: occasionally   Drug use: No   Sexual activity: Not on file  Other Topics Concern   Not on file  Social History Narrative   Lives with wife in a one story home.  Has one daughter.  Retired.  Education: Masters.   Social Determinants of Health   Financial Resource Strain: Not on file  Food Insecurity: Not on file  Transportation Needs: Not on file  Physical Activity: Not on file  Stress: Not on file  Social Connections: Not on file  Intimate Partner Violence: Not on file   Family History  Problem Relation Age of Onset   Heart disease Mother    Heart failure Mother    Heart disease Father    Heart failure Father    Sleep apnea Neg Hx    ROS: All systems reviewed and negative except as per HPI.   Current Outpatient Medications  Medication Sig Dispense Refill  acetaminophen (TYLENOL) 500 MG tablet Take 1,000 mg by mouth every 6 (six) hours as needed (pain).     amiodarone (PACERONE) 200 MG tablet Take 1 tablet (200 mg total) by mouth daily. Do NOT take Amiodarone on Wednesday or Sunday. 90 tablet 2   amoxicillin (AMOXIL) 500 MG capsule TAKE 4 CAPSULES BY MOUTH 30 MINUTES PRIOR TO DENTAL WORK 4 capsule 2   cefadroxil (DURICEF) 500 MG capsule Take 1 capsule (500 mg total) by mouth 2 (two) times daily. 20 capsule 0   furosemide (LASIX) 20 MG tablet Take 3 tablets (60 mg total) by mouth 2 (two) times daily. 180 tablet 8   levothyroxine (SYNTHROID) 88 MCG tablet TAKE 1 TABLET BY MOUTH EVERY DAY 90 tablet 0   melatonin 5 MG TABS 5 mg at bedtime as needed.     metoprolol succinate (TOPROL-XL) 25 MG 24 hr tablet Take 1 tablet (25 mg total) by mouth daily. 90 tablet 3   Multiple Vitamin (MULTIVITAMIN WITH MINERALS) TABS tablet Take 1 tablet by mouth in the morning.     nitroGLYCERIN (NITROSTAT) 0.4 MG SL tablet Place 1 tablet (0.4 mg total) under the tongue every 5 (five) minutes x 3 doses as needed for chest pain. 25  tablet 2   quiniDINE gluconate 324 MG CR tablet Take 1 tablet (324 mg total) by mouth 2 (two) times daily. 60 tablet 11   spironolactone (ALDACTONE) 25 MG tablet TAKE 1/2 TABLET BY MOUTH EVERY DAY 45 tablet 3   valsartan (DIOVAN) 40 MG tablet Take 20 mg by mouth daily.     Vericiguat (VERQUVO) 10 MG TABS Take 10 mg by mouth daily. 30 tablet 11   warfarin (COUMADIN) 5 MG tablet TAKE AS DIRECTED BY THE COUMADIN CLINIC 80 tablet 1   zinc gluconate 50 MG tablet Take 1 tablet (50 mg total) by mouth daily. 90 tablet 1   No current facility-administered medications for this encounter.   BP 90/60   Pulse 84   Wt 89.7 kg (197 lb 12.8 oz)   SpO2 96%   BMI 25.74 kg/m   Wt Readings from Last 3 Encounters:  12/31/22 89.7 kg (197 lb 12.8 oz)  12/08/22 93.3 kg (205 lb 9.6 oz)  12/07/22 92.7 kg (204 lb 6.4 oz)   PHYSICAL EXAM: General:  NAD. No resp difficulty, walked into clinic, fatigued-appearing. HEENT: Normal Neck: Supple. No JVD. Carotids 2+ bilat; no bruits. No lymphadenopathy or thryomegaly appreciated. Cor: PMI nondisplaced. Regular rate & rhythm. No rubs, gallops or murmurs. Lungs: Clear Abdomen: Soft, nontender, nondistended. No hepatosplenomegaly. No bruits or masses. Good bowel sounds. Extremities: No cyanosis, clubbing, rash, trace pedal edema Neuro: Alert & oriented x 3, cranial nerves grossly intact. Moves all 4 extremities w/o difficulty. Affect pleasant.  Assessment/Plan: 1. Fatigue/ Weakness/ Decreased Exercise Tolerance  - Etiology uncertain. Differential broad. Physical deconditioning certainly contributory, but worrisome for end stage HF. He is on the maximum tolerated GDMT, limited by renal function and soft BP. He has a CRT-D device however his LV lead is not functional. This is not ideal and creates dyssynchrony and may be contributing. Unfortunately, he is felt too high risk for system revision. His interrogation recently  showed short occurrences of AT/AF but of short  durations, < 1 hr but would not expect these brief paroxysmal runs to contribute to chronic fatigue. He has had on-going discussions about Barostim therapy and he is interested in this. Despite being 82 y/o, he had previously been very functional and would  like to return to previous QOL. Given age and renal fx, he is not an ideal candidate for LVAD. - I discussed with him today we will continue to maximize medical therapy, but may not be realistic to fully return to previous functional status, due to his advanced HF. Consider referral to CR or PT. 2. Chronic Systolic HF: Long-standing, suspected nonischemic cardiomyopathy.  Had RHC on 02/26/21 with normal filling pressures and mildly low cardiac output.  Echo in 4/23 showed  EF 20-25% with diffuse hypokinesis and septal-lateral dyssynchrony, mildly decreased RV systolic function, mechanical MV with normal function, dilated IVC.  He has a Medtronic CRT-D device present, but the LV lead is not functional.  He has complete heart block, so has been RV paced chronically.  Chronically NYHA class III-IIIb, worse more recently (see above). Euvolemic by exam and by OptiVol. - With low BP and on-going orthostasis, stop Verquvo. - Continue Lasix 60 mg bid. BMET and BNP today. - Continue spironolactone 12.5 mg qhs. - Continue valsartan 20 mg qhs (off Entresto with orthostasis).  - Continue Toprol XL 25 mg qhs.  - Off digoxin due to persistently elevated level.  - Wear compression hose. - The patient has CHB and has been chronically RV pacing as his LV lead is nonfunctional (when not in VT).  This is not ideal and creates dyssynchrony.  Remains symptomatic with limited ability to titrate meds. He has seen Dr. Ladona Ridgel, it would be possible to place left bundle lead but procedure would be complicated.  After further discussion with Dr. Ladona Ridgel, it was decided that risk would be too high and left bundle lead will not be attempted.   - He has an appt with Dr. Cory Roughen next  week. He has high hopes barostim will help him. His pro-BNP was >3000. I discussed he may not be a candidate for this, especially if risks outweigh benefits.  3. Atrial fibrillation/flutter: Paroxysmal. Brief episodes on device interrogation < 1/hr duration  - He is on amiodarone 200 mg daily. He is on Levoxyl for hypothyroidism. Most recent TSH 7.9,  - Continue Warfarin. INR followed by coumadin clinic.  4. VT: History of VT and VT ablation in 2017.  MDT ICD.  Redo VT ablation in 1/23 (Dr. Marquette Saa).  He is on amiodarone, this was increased to 200 mg daily recently with recurrent VT.  He has tolerated amiodarone poorly historically.  In 7/23, slow VT was pace-terminated in the office, then he had an ICD shock for VT later in 7/23.  He was started on quinidine after this.  He was having periodic VT treated with ATP, asymptomatic. He is followed by Dr. Ladona Ridgel. - Continue amiodarone 200 mg daily.  - Continue quinidine.  5. Complete heart block: Per notes, patient is pacer dependent due to CHB.  - See above, upgrade to CRT thought to be too risky. 6. OSA: Severe. Continue CPAP, due for titration study soon   Follow up in 3-4 weeks with Dr. Shirlee Latch.  Anderson Malta Blasdell, FNP-BC 12/31/2022

## 2022-12-30 ENCOUNTER — Ambulatory Visit (INDEPENDENT_AMBULATORY_CARE_PROVIDER_SITE_OTHER): Payer: Medicare Other

## 2022-12-30 DIAGNOSIS — Z5181 Encounter for therapeutic drug level monitoring: Secondary | ICD-10-CM | POA: Diagnosis not present

## 2022-12-30 DIAGNOSIS — Z952 Presence of prosthetic heart valve: Secondary | ICD-10-CM | POA: Diagnosis not present

## 2022-12-30 LAB — POCT INR: INR: 3.3 — AB (ref 2.0–3.0)

## 2022-12-30 NOTE — Patient Instructions (Signed)
Description   Spoke with pt and instructed to continue taking warfarin 1/2 tablet daily except for 1 tablet on Mondays and Fridays.  Recheck INR in 2 weeks.  Coumadin Clinic [phone].  AMIO DOSE 200MG  DAILY, 12/07/22 12/08/22-Amio 200mg  qd none Wed/Sun   Pt's cell phone [phone] in case difficulty reaching pt on home phone.

## 2022-12-31 ENCOUNTER — Encounter (HOSPITAL_COMMUNITY): Payer: Self-pay

## 2022-12-31 ENCOUNTER — Ambulatory Visit (HOSPITAL_COMMUNITY)
Admission: RE | Admit: 2022-12-31 | Discharge: 2022-12-31 | Disposition: A | Discharge status: Home / Self Care | Payer: Medicare Other | Source: Ambulatory Visit | Attending: Family Medicine | Admitting: Family Medicine

## 2022-12-31 VITALS — BP 90/60 | HR 84 | Wt 197.8 lb

## 2022-12-31 DIAGNOSIS — Z79899 Other long term (current) drug therapy: Secondary | ICD-10-CM | POA: Insufficient documentation

## 2022-12-31 DIAGNOSIS — E039 Hypothyroidism, unspecified: Secondary | ICD-10-CM | POA: Diagnosis not present

## 2022-12-31 DIAGNOSIS — R531 Weakness: Secondary | ICD-10-CM | POA: Diagnosis not present

## 2022-12-31 DIAGNOSIS — Z950 Presence of cardiac pacemaker: Secondary | ICD-10-CM | POA: Diagnosis not present

## 2022-12-31 DIAGNOSIS — Z8249 Family history of ischemic heart disease and other diseases of the circulatory system: Secondary | ICD-10-CM | POA: Diagnosis not present

## 2022-12-31 DIAGNOSIS — I5022 Chronic systolic (congestive) heart failure: Secondary | ICD-10-CM

## 2022-12-31 DIAGNOSIS — Z79890 Hormone replacement therapy (postmenopausal): Secondary | ICD-10-CM | POA: Diagnosis not present

## 2022-12-31 DIAGNOSIS — G4733 Obstructive sleep apnea (adult) (pediatric): Secondary | ICD-10-CM | POA: Diagnosis not present

## 2022-12-31 DIAGNOSIS — I4891 Unspecified atrial fibrillation: Secondary | ICD-10-CM | POA: Diagnosis not present

## 2022-12-31 DIAGNOSIS — I442 Atrioventricular block, complete: Secondary | ICD-10-CM | POA: Diagnosis not present

## 2022-12-31 DIAGNOSIS — I4892 Unspecified atrial flutter: Secondary | ICD-10-CM | POA: Diagnosis not present

## 2022-12-31 DIAGNOSIS — Z7901 Long term (current) use of anticoagulants: Secondary | ICD-10-CM | POA: Diagnosis not present

## 2022-12-31 DIAGNOSIS — I472 Ventricular tachycardia, unspecified: Secondary | ICD-10-CM | POA: Diagnosis not present

## 2022-12-31 DIAGNOSIS — I480 Paroxysmal atrial fibrillation: Secondary | ICD-10-CM | POA: Insufficient documentation

## 2022-12-31 LAB — BASIC METABOLIC PANEL
Anion gap: 11 (ref 5–15)
BUN: 57 mg/dL — ABNORMAL HIGH (ref 8–23)
CO2: 26 mmol/L (ref 22–32)
Calcium: 8.8 mg/dL — ABNORMAL LOW (ref 8.9–10.3)
Chloride: 98 mmol/L (ref 98–111)
Creatinine, Ser: 2.74 mg/dL — ABNORMAL HIGH (ref 0.61–1.24)
GFR, Estimated: 23 mL/min — ABNORMAL LOW (ref >60)
Glucose, Bld: 89 mg/dL (ref 70–99)
Potassium: 4.1 mmol/L (ref 3.5–5.1)
Sodium: 135 mmol/L (ref 135–145)

## 2022-12-31 NOTE — Addendum Note (Signed)
Addended by: Huston Foley on: 12/31/2022 11:08 AM   Modules accepted: Orders

## 2022-12-31 NOTE — Patient Instructions (Addendum)
Thank you for coming in today  If you had labs drawn today, any labs that are abnormal the clinic will call you No news is good news  Medications: STOP Verquro  Follow up appointments:  Your physician recommends that you schedule a follow-up appointment in:  4 weeks with Dr. Shirlee Latch 02/03/2023 at 2:40pm   Do the following things EVERYDAY: Weigh yourself in the morning before breakfast. Write it down and keep it in a log. Take your medicines as prescribed Eat low salt foods--Limit salt (sodium) to 2000 mg per day.  Stay as active as you can everyday Limit all fluids for the day to less than 2 liters   At the [facility], you and your health needs are our priority. As part of our continuing mission to provide you with exceptional heart care, we have created designated Provider Care Teams. These Care Teams include your primary Cardiologist (physician) and Advanced Practice Providers (APPs- Physician Assistants and Nurse Practitioners) who all work together to provide you with the care you need, when you need it.   You may see any of the following providers on your designated Care Team at your next follow up: Dr Arvilla Meres Dr Marca Ancona Dr. Marcos Eke, NP Robbie Lis, [place] [facility] [place], [place] Brynda Peon, NP Karle Plumber, PharmD   Please be sure to bring in all your medications bottles to every appointment.    Thank you for choosing [facility]  If you have any questions or concerns before your next appointment please send [place] a message through [place] or call our office at [phone].    TO LEAVE A MESSAGE FOR THE NURSE SELECT OPTION 2, PLEASE LEAVE A MESSAGE INCLUDING: YOUR NAME DATE OF BIRTH CALL BACK NUMBER REASON FOR CALL**this is important as we prioritize the call backs  YOU WILL RECEIVE A CALL BACK THE SAME DAY AS LONG AS YOU CALL BEFORE 4:00 PM

## 2022-12-31 NOTE — Telephone Encounter (Signed)
Order placed again for BiPAP titration.

## 2023-01-03 ENCOUNTER — Encounter: Payer: Self-pay | Admitting: Surgery

## 2023-01-03 ENCOUNTER — Other Ambulatory Visit (HOSPITAL_COMMUNITY): Payer: Self-pay | Admitting: Surgery

## 2023-01-03 ENCOUNTER — Ambulatory Visit: Payer: Medicare Other | Admitting: Surgery

## 2023-01-03 ENCOUNTER — Ambulatory Visit (HOSPITAL_COMMUNITY)
Admission: RE | Admit: 2023-01-03 | Discharge: 2023-01-03 | Disposition: A | Discharge status: Home / Self Care | Payer: Medicare Other | Source: Ambulatory Visit | Attending: Surgery | Admitting: Surgery

## 2023-01-03 VITALS — BP 103/67 | HR 68 | Temp 97.9°F | Resp 20 | Ht 73.5 in | Wt 197.7 lb

## 2023-01-03 DIAGNOSIS — I5042 Chronic combined systolic (congestive) and diastolic (congestive) heart failure: Secondary | ICD-10-CM | POA: Diagnosis not present

## 2023-01-03 DIAGNOSIS — Z01810 Encounter for preprocedural cardiovascular examination: Secondary | ICD-10-CM

## 2023-01-03 NOTE — Progress Notes (Addendum)
[facility]  Patient name: Austin [name] MRN: [id number] DOB: Dec 11, 1940 Sex: male   REQUESTING PROVIDER:    Dr. Shirlee Latch   REASON FOR CONSULT:    Barostim evaluation  HISTORY OF PRESENT ILLNESS:   Austin [name] is a 82 y.o. male, who is referred for Barostim evaluation.  Patient suffers from NYHA class III symptoms.  He has suspected nonischemic cardiomyopathy.  Echocardiogram and March 2024 shows an ejection fraction of 20-25%.  He has a biventricular ICD.  He has complete heart block.  He is on goal-directed medical therapy.  He still has issues with fatigue and shortness of breath.  His biggest complaint is lack of energy which is getting worse he is on Coumadin for atrial fibrillation.  About a decade ago, he had a device removed from his left chest secondary to infection.  His ICD is on the right.  He also has been treated for a right fifth toe wound that has essentially healed.  He has no infectious issues currently  PAST MEDICAL HISTORY    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    cx by infection, explantation11/12 & reimplant 1/13   Conductive hearing loss    Intraspinal abscess    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   Nonischemic cardiomyopathy (HCC)    Psychosexual dysfunction with inhibited sexual excitement    S/P mitral valve replacement    Syncope and collapse    Unspecified sleep apnea    last sleep study 11/07   Ventricular tachycardia (HCC)    VT (ventricular tachycardia) (HCC)      FAMILY HISTORY   Family History  Problem Relation Age of Onset   Heart disease Mother    Heart failure Mother    Heart disease Father    Heart failure Father    Sleep apnea Neg Hx     SOCIAL HISTORY:    Social History   Socioeconomic History   Marital status: Married    Spouse name: Not on file   Number of children: 1   Years of education: 18   Highest education level: Master's degree (e.g., MA, MS, MEng, MEd, MSW, MBA)  Occupational History   Occupation: retired from [employer]  Tobacco Use   Smoking status: Never   Smokeless tobacco: Never  Vaping Use   Vaping Use: Never used  Substance and Sexual Activity   Alcohol use: Yes    Alcohol/week: 0.0 standard drinks of alcohol    Comment: occasionally   Drug use: No   Sexual activity: Not on file  Other Topics Concern   Not on file  Social History Narrative   Lives with wife in a one story home.  Has one daughter.  Retired.  Education: Masters.   Social Determinants of Health   Financial Resource Strain: Not on file  Food Insecurity: Not on file  Transportation Needs: Not on file  Physical Activity: Not on file  Stress: Not on file  Social Connections: Not on file  Intimate Partner Violence: Not on file    ALLERGIES:    No Known Allergies  CURRENT MEDICATIONS:    Current Outpatient Medications  Medication Sig Dispense Refill   acetaminophen (TYLENOL) 500 MG tablet Take 1,000 mg by mouth every 6 (six) hours as needed (pain).  amiodarone (PACERONE) 200 MG tablet Take 1 tablet (200 mg total) by mouth daily. Do NOT take Amiodarone on Wednesday or Sunday. 90 tablet 2   amoxicillin (AMOXIL) 500 MG capsule TAKE 4 CAPSULES BY MOUTH 30 MINUTES PRIOR TO DENTAL WORK 4 capsule 2   cefadroxil (DURICEF) 500 MG capsule Take 1 capsule (500 mg total) by mouth 2 (two) times daily. 20 capsule 0   furosemide (LASIX) 20 MG tablet Take 3 tablets (60 mg total) by mouth 2 (two) times daily. 180 tablet 8   levothyroxine (SYNTHROID) 88 MCG tablet TAKE 1 TABLET BY MOUTH EVERY DAY 90 tablet 0   melatonin 5 MG TABS 5 mg at bedtime as needed.     metoprolol succinate (TOPROL-XL) 25 MG 24 hr tablet Take 1 tablet (25 mg total) by  mouth daily. 90 tablet 3   Multiple Vitamin (MULTIVITAMIN WITH MINERALS) TABS tablet Take 1 tablet by mouth in the morning.     nitroGLYCERIN (NITROSTAT) 0.4 MG SL tablet Place 1 tablet (0.4 mg total) under the tongue every 5 (five) minutes x 3 doses as needed for chest pain. 25 tablet 2   quiniDINE gluconate 324 MG CR tablet Take 1 tablet (324 mg total) by mouth 2 (two) times daily. 60 tablet 11   spironolactone (ALDACTONE) 25 MG tablet TAKE 1/2 TABLET BY MOUTH EVERY DAY 45 tablet 3   valsartan (DIOVAN) 40 MG tablet Take 20 mg by mouth daily.     warfarin (COUMADIN) 5 MG tablet TAKE AS DIRECTED BY THE COUMADIN CLINIC 80 tablet 1   zinc gluconate 50 MG tablet Take 1 tablet (50 mg total) by mouth daily. 90 tablet 1   No current facility-administered medications for this visit.    REVIEW OF SYSTEMS:   [X]  denotes positive finding, [ ]  denotes negative finding Cardiac  Comments:  Chest pain or chest pressure:    Shortness of breath upon exertion: x   Short of breath when lying flat:    Irregular heart rhythm:        Vascular    Pain in calf, thigh, or hip brought on by ambulation:    Pain in feet at night that wakes you up from your sleep:     Blood clot in your veins:    Leg swelling:         Pulmonary    Oxygen at home:    Productive cough:     Wheezing:         Neurologic    Sudden weakness in arms or legs:     Sudden numbness in arms or legs:     Sudden onset of difficulty speaking or slurred speech:    Temporary loss of vision in one eye:     Problems with dizziness:         Gastrointestinal    Blood in stool:      Vomited blood:         Genitourinary    Burning when urinating:     Blood in urine:        Psychiatric    Major depression:         Hematologic    Bleeding problems:    Problems with blood clotting too easily:        Skin    Rashes or ulcers:        Constitutional    Fever or chills:     PHYSICAL EXAM:   There were no vitals filed for this  visit.  GENERAL: The patient is a well-nourished male, in no acute distress. The vital signs are documented above. CARDIAC: There is a regular rate and rhythm.  VASCULAR: SonoSite was used to evaluate the right carotid bifurcation which is in the mid neck PULMONARY: Nonlabored respirations ABDOMEN: Soft and non-tender with normal pitched bowel sounds.  MUSCULOSKELETAL: There are no major deformities or cyanosis. NEUROLOGIC: No focal weakness or paresthesias are detected. SKIN: There are no ulcers or rashes noted. PSYCHIATRIC: The patient has a normal affect.  STUDIES:   I have reviewed the following carotid Doppler study: 1-39% bilaterally  ASSESSMENT and PLAN   NYHA class III: I think the patient would be a good candidate for Barostim therapy.  He has had a prior device removed from his left chest secondary to infection.  Therefore I discussed placing his device behind the muscle.  We talked about the risks of infection.  We talked about the details of the procedure.  He is eager to do this given his current symptoms despite goal-directed medical therapy.  He is on Coumadin for his mitral valve.  I will ask cardiology to make recommendations regarding stopping this.  I also discussed the possibility of keeping him overnight if there are issues with bleeding during surgery.   Charlena Cross, MD, FACS [facility] [phone] Pager [phone]

## 2023-01-05 NOTE — Telephone Encounter (Signed)
Can write for a new AutoPap machine, however, please reiterate the recommendation that AutoPap therapy is not the recommended optimal treatment modality for his central sleep apnea.  I can ask for the settings to be the same as his old machine, 4 to 16 cm of pressure.  At this juncture, I would recommend close follow-up with cardiology and also follow-up with primary care to discuss evaluation through pulmonology for low oxygen saturations, his average oxygen saturation during the home sleep test study was only 88%.  He can follow-up in sleep clinic in 2 to 3 months after set up date with new equipment.

## 2023-01-05 NOTE — Addendum Note (Signed)
Addended by: Huston Foley on: 01/05/2023 12:10 PM   Modules accepted: Orders

## 2023-01-06 NOTE — Telephone Encounter (Signed)
Patient does not want to do the in lab study. I have written for a new autoPAP machine reflecting the settings of the previous machine.

## 2023-01-06 NOTE — Telephone Encounter (Signed)
Noted, I canceled the sleep study order.

## 2023-01-10 NOTE — Telephone Encounter (Signed)
New, Doristine Mango, RN; Penni Homans; Carlisle Beers Received, Thank you!     Previous Messages    ----- Message ----- From: Guy Begin, RN Sent: 01/05/2023   3:57 PM EDT To: Henderson Newcomer; Kathyrn Sheriff; Santina Evans; * Subject: new auto pap machine                          New order in EPIC   Thank you, Darnelle Going L. Earna Coder Male, 82 y.o., 06-16-1941 Pronouns: he/him/his MRN: [id number] Phone: [phone]

## 2023-01-12 NOTE — Progress Notes (Signed)
Remote ICD transmission.   

## 2023-01-13 ENCOUNTER — Ambulatory Visit (INDEPENDENT_AMBULATORY_CARE_PROVIDER_SITE_OTHER): Payer: Medicare Other | Admitting: *Deleted

## 2023-01-13 ENCOUNTER — Encounter (HOSPITAL_COMMUNITY): Payer: Self-pay | Admitting: Cardiology

## 2023-01-13 DIAGNOSIS — Z5181 Encounter for therapeutic drug level monitoring: Secondary | ICD-10-CM

## 2023-01-13 DIAGNOSIS — I4891 Unspecified atrial fibrillation: Secondary | ICD-10-CM

## 2023-01-13 LAB — POCT INR: INR: 1.8 — AB (ref 2.0–3.0)

## 2023-01-13 NOTE — Patient Instructions (Signed)
Description   Spoke with pt and instructed  him to take 1 tablet of warfarin today and 1.5 tablets of warfarin tomorrow then continue taking warfarin 1/2 tablet daily except for 1 tablet on Mondays and Fridays.  Recheck INR in 2 weeks.  Coumadin Clinic [phone].  AMIO DOSE 200MG  DAILY, 12/07/22 12/08/22-Amio 200mg  qd none Wed/Sun   Pt's cell phone [phone] in case difficulty reaching pt on home phone.

## 2023-01-17 ENCOUNTER — Ambulatory Visit: Payer: Medicare Other | Attending: Internal Medicine

## 2023-01-17 ENCOUNTER — Other Ambulatory Visit (HOSPITAL_COMMUNITY): Payer: Self-pay | Admitting: Cardiology

## 2023-01-17 DIAGNOSIS — I5022 Chronic systolic (congestive) heart failure: Secondary | ICD-10-CM

## 2023-01-17 DIAGNOSIS — Z95810 Presence of automatic (implantable) cardiac defibrillator: Secondary | ICD-10-CM

## 2023-01-19 ENCOUNTER — Other Ambulatory Visit (HOSPITAL_COMMUNITY): Payer: Self-pay

## 2023-01-19 DIAGNOSIS — I5022 Chronic systolic (congestive) heart failure: Secondary | ICD-10-CM

## 2023-01-19 DIAGNOSIS — I472 Ventricular tachycardia, unspecified: Secondary | ICD-10-CM

## 2023-01-19 MED ORDER — METOPROLOL SUCCINATE ER 25 MG PO TB24
12.5000 mg | ORAL_TABLET | Freq: Every day | ORAL | 3 refills | Status: DC
Start: 2023-01-19 — End: 2023-04-01

## 2023-01-21 NOTE — Progress Notes (Signed)
EPIC Encounter for ICM Monitoring  Patient Name: Austin [name] is a 82 y.o. male Date: 01/21/2023 Primary Care Physican: [name], MD Primary Cardiologist: Smith/McLean Electrophysiologist: Ladona Ridgel 08/10/2022 Weight: 202 lbs 09/01/2022   Weight: 202 lbs 09/28/2022   Weight: 200 lbs 11/09/2022 Weight: 192.7 lbs 12/22/2022 Weight: 191 lbs                Since 22-Dec-2022 AT/AF  42 Time in AT/AF  <0.1 hr/day (0.3%) Longest AT/AF  8 minutes               Spoke with patient and heart failure questions reviewed.  Transmission results reviewed.  Pt reports feeling well at this time and voices no complaints.     Optivol Thoracic impedance suggesting intermittent days with possible fluid accumulation within the last month.   Prescribed:  Furosemide 20 mg Take 3 tablets (60 mg total) by mouth twice a day.   He self adjusts Furosemide when needed.  Spironolactone 25 mg take 0.5 tablet by mouth daily   Labs: 12/31/2022 Creatinine 2.74, BUN 57, Potassium 4.1, Sodium 135, GFR 23 12/08/2022 Creatinine 2.33, BUN 42, Potassium 4.3, Sodium 135, GFR 28 11/18/2022 Creatinine 2.88, BUN 62, Potassium 4.0, Sodium 136, GFR 21 11/08/2022 Creatinine 2.46, BUN 55, Potassium 4.1, Sodium 134, GFR 26, BNP 528.8 10/19/2022 Creatinine 2.08, BUN 47, Potassium 4.8, Sodium 136, GFR 32  10/08/2022 Creatinine 2.48, BUN 47, Potassium 3.7, Sodium 135, GFR 26  09/27/2022 Creatinine 2.26, BUN 53, Potassium 4.9, Sodium 138, GFR 26.83 A complete set of results can be found in Results Review.   Recommendations: Encouraged to call if experiencing any fluid symptoms.    Follow-up plan: ICM clinic phone appointment on 02/21/2023.   91 day device clinic remote transmission 03/23/2023.      EP/Cardiology Office Visits:    02/03/2023 with Dr Shirlee Latch.  06/01/2023 with Dr Ladona Ridgel.     Copy of ICM check sent to Dr. Ladona Ridgel.    3 month ICM trend: 01/17/2023.     Karie Soda, RN 01/21/2023 2:13 PM

## 2023-01-22 ENCOUNTER — Other Ambulatory Visit: Payer: Self-pay | Admitting: Internal Medicine

## 2023-01-24 ENCOUNTER — Other Ambulatory Visit (HOSPITAL_COMMUNITY): Payer: Self-pay

## 2023-01-24 ENCOUNTER — Other Ambulatory Visit: Payer: Self-pay

## 2023-01-24 ENCOUNTER — Ambulatory Visit (INDEPENDENT_AMBULATORY_CARE_PROVIDER_SITE_OTHER): Payer: Medicare Other | Admitting: Cardiology

## 2023-01-24 DIAGNOSIS — Z95810 Presence of automatic (implantable) cardiac defibrillator: Secondary | ICD-10-CM

## 2023-01-24 DIAGNOSIS — Z5181 Encounter for therapeutic drug level monitoring: Secondary | ICD-10-CM | POA: Diagnosis not present

## 2023-01-24 DIAGNOSIS — I5022 Chronic systolic (congestive) heart failure: Secondary | ICD-10-CM

## 2023-01-24 DIAGNOSIS — I4891 Unspecified atrial fibrillation: Secondary | ICD-10-CM

## 2023-01-24 LAB — POCT INR: INR: 2.4 (ref 2.0–3.0)

## 2023-01-24 NOTE — Telephone Encounter (Signed)
Pt is a CHF pt

## 2023-01-24 NOTE — Telephone Encounter (Signed)
Nida Boatman says F2F notes are needed. Pt had a visit on 10/20/22 and [employer] should be able to pull the notes. I sent him a message back. Will update when I have another response.

## 2023-01-24 NOTE — Telephone Encounter (Signed)
Message sent to [facility] w/ [employer] to check status update.

## 2023-01-24 NOTE — Telephone Encounter (Signed)
Brad w/ [employer] says he has sent an expedited request back to his intake team.

## 2023-01-24 NOTE — Patient Instructions (Signed)
Description   Spoke with pt and instructed him to take 1.5 tablets of warfarin today then START taking warfarin 1/2 tablet daily except for 1 tablet on Mondays, Wednesdays, and Fridays.  Recheck INR in 2 weeks.  Coumadin Clinic [phone].  AMIO DOSE 200MG  DAILY, 12/07/22 12/08/22-Amio 200mg  qd none Wed/Sun   Pt's cell phone [phone] in case difficulty reaching pt on home phone.

## 2023-01-25 MED ORDER — AMIODARONE HCL 200 MG PO TABS
200.0000 mg | ORAL_TABLET | Freq: Every day | ORAL | 2 refills | Status: DC
Start: 2023-01-25 — End: 2023-03-18

## 2023-01-25 NOTE — Telephone Encounter (Signed)
New, Maryella Shivers, Otilio Jefferson, RN; [place], Elige Radon; Ivins, Melissa Hello Toma Copier,  I asked my manager and one of the intake specialists to review this order along with sales and my manger reviewed and approved this order. She had the intake specialist create this order for scheduling and insurance review. We should be good to go. Order is in and patient should be contact for setup soon.  Thank you,  Luellen Pucker

## 2023-01-31 DIAGNOSIS — G4733 Obstructive sleep apnea (adult) (pediatric): Secondary | ICD-10-CM | POA: Diagnosis not present

## 2023-02-03 ENCOUNTER — Ambulatory Visit (HOSPITAL_COMMUNITY)
Admission: RE | Admit: 2023-02-03 | Discharge: 2023-02-03 | Disposition: A | Discharge status: Home / Self Care | Payer: Medicare Other | Source: Ambulatory Visit | Attending: Cardiology | Admitting: Cardiology

## 2023-02-03 ENCOUNTER — Encounter (HOSPITAL_COMMUNITY): Payer: Self-pay | Admitting: Cardiology

## 2023-02-03 VITALS — BP 110/70 | HR 71 | Wt 198.8 lb

## 2023-02-03 DIAGNOSIS — Z7901 Long term (current) use of anticoagulants: Secondary | ICD-10-CM | POA: Diagnosis not present

## 2023-02-03 DIAGNOSIS — Z79890 Hormone replacement therapy (postmenopausal): Secondary | ICD-10-CM | POA: Insufficient documentation

## 2023-02-03 DIAGNOSIS — I480 Paroxysmal atrial fibrillation: Secondary | ICD-10-CM | POA: Diagnosis not present

## 2023-02-03 DIAGNOSIS — I442 Atrioventricular block, complete: Secondary | ICD-10-CM | POA: Diagnosis not present

## 2023-02-03 DIAGNOSIS — Z79899 Other long term (current) drug therapy: Secondary | ICD-10-CM | POA: Diagnosis not present

## 2023-02-03 DIAGNOSIS — I4892 Unspecified atrial flutter: Secondary | ICD-10-CM | POA: Diagnosis not present

## 2023-02-03 DIAGNOSIS — Z950 Presence of cardiac pacemaker: Secondary | ICD-10-CM | POA: Diagnosis not present

## 2023-02-03 DIAGNOSIS — G4733 Obstructive sleep apnea (adult) (pediatric): Secondary | ICD-10-CM | POA: Insufficient documentation

## 2023-02-03 DIAGNOSIS — I5022 Chronic systolic (congestive) heart failure: Secondary | ICD-10-CM | POA: Diagnosis not present

## 2023-02-03 DIAGNOSIS — E039 Hypothyroidism, unspecified: Secondary | ICD-10-CM | POA: Insufficient documentation

## 2023-02-03 DIAGNOSIS — Z952 Presence of prosthetic heart valve: Secondary | ICD-10-CM | POA: Insufficient documentation

## 2023-02-03 LAB — COMPREHENSIVE METABOLIC PANEL
ALT: 26 U/L (ref 0–44)
AST: 31 U/L (ref 15–41)
Albumin: 3.7 g/dL (ref 3.5–5.0)
Alkaline Phosphatase: 88 U/L (ref 38–126)
Anion gap: 10 (ref 5–15)
BUN: 50 mg/dL — ABNORMAL HIGH (ref 8–23)
CO2: 28 mmol/L (ref 22–32)
Calcium: 8.8 mg/dL — ABNORMAL LOW (ref 8.9–10.3)
Chloride: 95 mmol/L — ABNORMAL LOW (ref 98–111)
Creatinine, Ser: 2.57 mg/dL — ABNORMAL HIGH (ref 0.61–1.24)
GFR, Estimated: 25 mL/min — ABNORMAL LOW (ref >60)
Glucose, Bld: 100 mg/dL — ABNORMAL HIGH (ref 70–99)
Potassium: 4.7 mmol/L (ref 3.5–5.1)
Sodium: 133 mmol/L — ABNORMAL LOW (ref 135–145)
Total Bilirubin: 0.9 mg/dL (ref 0.3–1.2)
Total Protein: 6.8 g/dL (ref 6.5–8.1)

## 2023-02-03 LAB — CBC
HCT: 37.5 % — ABNORMAL LOW (ref 39.0–52.0)
Hemoglobin: 12.2 g/dL — ABNORMAL LOW (ref 13.0–17.0)
MCH: 30.9 pg (ref 26.0–34.0)
MCHC: 32.5 g/dL (ref 30.0–36.0)
MCV: 94.9 fL (ref 80.0–100.0)
Platelets: 146 10*3/uL — ABNORMAL LOW (ref 150–400)
RBC: 3.95 MIL/uL — ABNORMAL LOW (ref 4.22–5.81)
RDW: 16 % — ABNORMAL HIGH (ref 11.5–15.5)
WBC: 7.8 10*3/uL (ref 4.0–10.5)
nRBC: 0 % (ref 0.0–0.2)

## 2023-02-03 LAB — TSH: TSH: 6.129 u[IU]/mL — ABNORMAL HIGH (ref 0.350–4.500)

## 2023-02-03 LAB — BRAIN NATRIURETIC PEPTIDE: B Natriuretic Peptide: 453.3 pg/mL — ABNORMAL HIGH (ref 0.0–100.0)

## 2023-02-03 NOTE — Patient Instructions (Signed)
Medication Changes:  No Changes In Medications at this time.   Lab Work:  Labs done today, your results will be available in MyChart, we will contact you for abnormal readings.  Follow-Up in: 2 MONTHS WITH APP AS SCHEDULED   At the [facility], you and your health needs are our priority. We have a designated team specialized in the treatment of Heart Failure. This Care Team includes your primary Heart Failure Specialized Cardiologist (physician), Advanced Practice Providers (APPs- Physician Assistants and Nurse Practitioners), and Pharmacist who all work together to provide you with the care you need, when you need it.   You may see any of the following providers on your designated Care Team at your next follow up:  Dr. Arvilla Meres Dr. Marca Ancona Dr. Marcos Eke, NP Robbie Lis, [place] [facility] [place], [place] Brynda Peon, NP Karle Plumber, PharmD   Please be sure to bring in all your medications bottles to every appointment.   Need to Contact us:  If you have any questions or concerns before your next appointment please send [place] a message through [place] or call our office at [phone].    TO LEAVE A MESSAGE FOR THE NURSE SELECT OPTION 2, PLEASE LEAVE A MESSAGE INCLUDING: YOUR NAME DATE OF BIRTH CALL BACK NUMBER REASON FOR CALL**this is important as we prioritize the call backs  YOU WILL RECEIVE A CALL BACK THE SAME DAY AS LONG AS YOU CALL BEFORE 4:00 PM

## 2023-02-04 NOTE — Progress Notes (Signed)
PCP: [name], MD Cardiology: Dr. Katrinka Blazing HF Cardiology: Dr. Shirlee Latch EP: Dr. Ladona Ridgel  82 y.o. with history of mechanical mitral valve s/p MV endocarditis, VT, nonischemic cardiomyopathy, complete heart block was referred by Dr. Katrinka Blazing for evaluation of CHF.  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 He subsequently developed a presumed nonischemic cardiomyopathy.  This was complicated by VT, he had a VT ablation in 2017 and had a Medtronic CRT-D device implanted. This had to be removed due to infection in 11/12 and was reimplanted in 1/13. Echo in 5/22 showed EF 30% with normal RV, mechanical MV stable with mean gradient 3 mmHg.  He has been in atrial fibrillation persistently since late 2021.  He is on amiodarone and has been cardioverted in the past. His LV lead is not functional.  He has complete heart block and is persistently RV pacing.   Had RHC 02/26/21 RA 4, PA 32/12, PCWP 11, CO 4.6, CI 2.2. Normal filling pressures and slightly low cardiac output.   Digoxin was stopped due to elevated level.   He saw Dr. Ladona Ridgel in 9/22 and was in slow VT in the office.  He was paced out of VT and amiodarone was increased to 200 mg daily.   He saw Dr. Ladona Ridgel, and placement of a left bundle or LV lead was decided to be too risky a procedure.  He was referred to Dr. Marquette Saa with the hope that he could have a VT ablation and get off amiodarone, which is likely playing a role in his symptoms.     S/p VT ablation with Dr. Marquette Saa 1/23.  Echo in 4/23 showed EF 20-25% with diffuse hypokinesis and septal-lateral dyssynchrony, mildly decreased RV systolic function, mechanical MV with normal function, dilated IVC.   He was seen in the office in 7/23, noted to be in atrial flutter in the atria and slow VT in the ventricles.  VT was pace terminated by Dr. Graciela Husbands and lower threshold set for ATP.  Later in 7/23, he had another VT episode and had an ICD discharge.  Quinidine  was then started by Dr. Ladona Ridgel.   Follow up 10/23, had runs of VT terminated by ATP. Stable NYHA III and volume stable by OptiVol.  Echo 3/24 showed EF 20-25%, mildly decreased RV systolic function, moderate TR, IVC dilated, stable mechanical mitral valve with mean gradient 4 mmHg.   Follow up with EP 12/07/22, EKG showed atrial tach w/ ventricular pacing. Attempts were made to pace out but could not. Dr. Ladona Ridgel increased his ATP and turned off the VT therapy in the slow VT zone. Continued on amiodarone and quinidine.   Acute visit 5/24 with complaints of increased weakness, fatigue and near falls. In March, his PCP checked labs and noted that TSH was elevated at 10 and labs suggested anemia. Device interrogation showed euvolemia, frequent occurrences of AT/AF but of short durations. There was discussion of Barostim candidacy but his pro-BNP was > 3692. Concern for general deconditioning vs end-stage HF.  He subsequently saw [name] to look into barostimulation activation therapy, needs lower pro-BNP.   Today he returns for HF follow up. BP has quite low at last appointment and Verquvo was stopped.  He feels no worse off Verquvo and his BP has been running higher (110/70 today).  He walks with a walker, can get 50-100 yards before having to stop due to fatigue and dyspnea.  No chest pain.  No orthopnea/PND.   Using CPAP  at night.   Device interrogation: OptiVol stable, no recent AF and no VT, 99% v-pacing  ECG (personally reviewed): A-V sequential pacing with wide QRS  Labs (7/22): K 4.6, creatinine 1.3 Labs (8/22): K 4.4, creatinine 1.23 Labs (10/22): K 3.9, creatinine 1.36 Labs (12/22): K 4.2, creatinine 1.39 Labs (1/23): K 4.3, creatinine 1.15 Labs (2/23): TSH 10, K 4.1, creatinine 1.54 Labs (6/23): BNP 798, K 4.1, creatinine 1.3 Labs (7/23): BNP 1244, LFTs normal, K 4.2, creatinine 1.49 Labs (10/23): K 4, creatinine 1.72, TSH 5.7, transferrin saturation 28%, hgb 12.8, plts 128, LFTs  normal Labs (3/24): K 4.9, creatinine 2.26, TSH 10.9, hgb 12.4 Labs (4/24): K 4.0, creatinine 2.88  Labs (5/24): K 4.3, creatinine 2.33, TSH 7.9  Labs (6/24): K 4.1, creatinine 2.74  PMH: 1. 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 2. H/o VT: VT ablation in 2017.  - Redo VT ablation in 1/23 with Dr. Marquette Saa - Arita Miss termination of VT in office in 7/23.  - ICD shock 7/23, started quinidine.  3. Atrial fibrillation/atypical flutter: Persistent since 2021.  Failed amiodarone.  4. PAD: Tibial occlusive disease.  5. 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  6. Hypothyroidism.  7. Chronic systolic CHF: Presumed nonischemic cardiomyopathy.  Medtronic CRT-D device, LV lead is nonfunctional.  - Echo (2012): EF 45% - Echo (1/17): EF 30-35% - Echo (6/21): EF 15-20% - Echo (11/21): EF 30-35%  - Echo (5/22): EF 30%, normal RV, mechanical mitral valve mean gradient 3 mmHg.  - RHC (8/22): RA mean 4, RV 33/4,PA 32/12 mean 21, PCWP mean 11, Oxygen saturations:PA 71%, AO 98%, Cardiac Output (Fick) 4.66 Cardiac Index (Fick) 2.18 - Echo (4/23): EF 20-25% with diffuse hypokinesis and septal-lateral dyssynchrony, mildly decreased RV systolic function, mechanical MV with normal function, dilated IVC.  - Echo (3/24): EF 20-25%, mildly decreased RV systolic function, moderate TR, IVC dilated, stable mechanical mitral valve with mean gradient 4 mmHg.   Social History   Socioeconomic History   Marital status: Married    Spouse name: Not on file   Number of children: 1   Years of education: 36   Highest education level: Master's degree (e.g., MA, MS, MEng, MEd, MSW, MBA)  Occupational History   Occupation: retired from [employer]  Tobacco Use   Smoking status: Never   Smokeless tobacco: Never  Vaping Use   Vaping status: Never Used  Substance and Sexual Activity   Alcohol use: Yes    Alcohol/week: 0.0 standard drinks of alcohol    Comment: occasionally   Drug use: No    Sexual activity: Not on file  Other Topics Concern   Not on file  Social History Narrative   Lives with wife in a one story home.  Has one daughter.  Retired.  Education: Masters.   Social Determinants of Health   Financial Resource Strain: Not on file  Food Insecurity: Not on file  Transportation Needs: Not on file  Physical Activity: Not on file  Stress: Not on file  Social Connections: Not on file  Intimate Partner Violence: Not on file   Family History  Problem Relation Age of Onset   Heart disease Mother    Heart failure Mother    Heart disease Father    Heart failure Father    Sleep apnea Neg Hx    ROS: All systems reviewed and negative except as per HPI.   Current Outpatient Medications  Medication Sig Dispense Refill   acetaminophen (TYLENOL) 500 MG tablet  Take 1,000 mg by mouth every 6 (six) hours as needed (pain).     amiodarone (PACERONE) 200 MG tablet Take 1 tablet (200 mg total) by mouth daily. Do NOT take Amiodarone on Wednesday or Sunday. 90 tablet 2   amoxicillin (AMOXIL) 500 MG capsule TAKE 4 CAPSULES BY MOUTH 30 MINUTES PRIOR TO DENTAL WORK 4 capsule 2   furosemide (LASIX) 20 MG tablet Take 3 tablets (60 mg total) by mouth 2 (two) times daily. 180 tablet 8   levothyroxine (SYNTHROID) 88 MCG tablet TAKE 1 TABLET BY MOUTH EVERY DAY 90 tablet 0   melatonin 5 MG TABS 5 mg at bedtime as needed.     metoprolol succinate (TOPROL-XL) 25 MG 24 hr tablet Take 0.5 tablets (12.5 mg total) by mouth daily. 90 tablet 3   Multiple Vitamin (MULTIVITAMIN WITH MINERALS) TABS tablet Take 1 tablet by mouth in the morning.     nitroGLYCERIN (NITROSTAT) 0.4 MG SL tablet Place 1 tablet (0.4 mg total) under the tongue every 5 (five) minutes x 3 doses as needed for chest pain. 25 tablet 2   quiniDINE gluconate 324 MG CR tablet TAKE 1 TABLET BY MOUTH 2 TIMES DAILY. 180 tablet 3   spironolactone (ALDACTONE) 25 MG tablet TAKE 1/2 TABLET BY MOUTH EVERY DAY 45 tablet 3   valsartan  (DIOVAN) 40 MG tablet Take 20 mg by mouth daily.     warfarin (COUMADIN) 5 MG tablet TAKE AS DIRECTED BY THE COUMADIN CLINIC 80 tablet 1   zinc gluconate 50 MG tablet Take 1 tablet (50 mg total) by mouth daily. 90 tablet 1   No current facility-administered medications for this encounter.   BP 110/70   Pulse 71   Wt 90.2 kg (198 lb 12.8 oz)   SpO2 99%   BMI 25.87 kg/m   Wt Readings from Last 3 Encounters:  02/03/23 90.2 kg (198 lb 12.8 oz)  01/03/23 89.7 kg (197 lb 11.2 oz)  12/31/22 89.7 kg (197 lb 12.8 oz)   PHYSICAL EXAM: General: NAD Neck: No JVD, no thyromegaly or thyroid nodule.  Lungs: Clear to auscultation bilaterally with normal respiratory effort. CV: Nondisplaced PMI.  Heart regular with mechanical S1, no S3/S4, 2/6 SEM RUSB.  Trace ankle edema.  No carotid bruit.  Normal pedal pulses.  Abdomen: Soft, nontender, no hepatosplenomegaly, no distention.  Skin: Intact without lesions or rashes.  Neurologic: Alert and oriented x 3.  Psych: Normal affect. Extremities: No clubbing or cyanosis.  HEENT: Normal.   Assessment/Plan: 1. Chronic Systolic HF: Long-standing, suspected nonischemic cardiomyopathy.  Had RHC on 02/26/21 with normal filling pressures and mildly low cardiac output.  Echo in 4/23 showed  EF 20-25% with diffuse hypokinesis and septal-lateral dyssynchrony, mildly decreased RV systolic function, mechanical MV with normal function, dilated IVC.  He has a Medtronic CRT-D device present, but the LV lead is not functional.  He has complete heart block, so has been RV paced chronically with a wide QRS.  Chronically NYHA class III, seems better recently with higher blood pressure.  Not volume overloaded by exam or Optivol.  - BP higher off Verquvo, will leave off.  - Continue Lasix 60 mg bid. BMET and BNP today. - Continue spironolactone 12.5 mg qhs. - Continue valsartan 20 mg qhs (off Entresto with orthostasis).  - Continue Toprol XL 12.5 mg qhs.  - Off digoxin due to  persistently elevated level.  - He did not tolerate SGLT2 inhibitor in the past.  - Wear compression hose. -  The patient has CHB and has been chronically RV pacing as his LV lead is nonfunctional.  This is not ideal and creates dyssynchrony with a very wide QRS.  Remains symptomatic with limited ability to titrate meds. He has seen Dr. Ladona Ridgel, it would be possible to place left bundle lead but procedure would be complicated.  After further discussion with Dr. Ladona Ridgel, it was decided that risk would be too high and left bundle lead will not be attempted.   - He is being worked for [employer] activation therapy.  Last pro-BNP was too high, sending again today as volume status looks ok.  He has seen [name]. He will need a Lovenox bridge while off warfarin for barostimulation implantation.  - Arrange for echo after next visit.  2. Atrial fibrillation/flutter: Paroxysmal. No recent episodes.  - He is on amiodarone 200 mg daily. He is on Levoxyl for hypothyroidism. Check LFTs, TSH.  He will need a regular eye exam.  - Continue Warfarin. INR followed by coumadin clinic.  3. VT: History of VT and VT ablation in 2017.  MDT ICD.  Redo VT ablation in 1/23 (Dr. Marquette Saa).  He is on amiodarone, this was increased to 200 mg daily recently with recurrent VT.  He has tolerated amiodarone poorly historically.  In 7/23, slow VT was pace-terminated in the office, then he had an ICD shock for VT later in 7/23.  He was started on quinidine after this.  He is followed by Dr. Ladona Ridgel. - Continue amiodarone 200 mg daily.  - Continue quinidine.  4. Complete heart block: Per notes, patient is pacer dependent due to CHB.  - See above, upgrade to functional CRT thought to be too risky. 5. OSA: Severe. Continue CPAP 6. Mechanical mitral valve: Stable on last echo.  - Continue warfarin.    Follow up in 2 months with APP  Marca Ancona, 02/04/2023

## 2023-02-07 ENCOUNTER — Ambulatory Visit (INDEPENDENT_AMBULATORY_CARE_PROVIDER_SITE_OTHER): Payer: Medicare Other

## 2023-02-07 DIAGNOSIS — Z5181 Encounter for therapeutic drug level monitoring: Secondary | ICD-10-CM | POA: Diagnosis not present

## 2023-02-07 LAB — POCT INR: INR: 3 (ref 2.0–3.0)

## 2023-02-07 NOTE — Patient Instructions (Signed)
Description   Spoke with pt and instructed him to continue taking warfarin 1/2 tablet daily except for 1 tablet on Mondays, Wednesdays, and Fridays.  Recheck INR in 2 weeks.  Coumadin Clinic [phone].  AMIO DOSE 200MG  DAILY, 12/07/22 12/08/22-Amio 200mg  qd none Wed/Sun   Pt's cell phone [phone] in case difficulty reaching pt on home phone.

## 2023-02-12 ENCOUNTER — Other Ambulatory Visit: Payer: Self-pay | Admitting: Internal Medicine

## 2023-02-12 DIAGNOSIS — Z95810 Presence of automatic (implantable) cardiac defibrillator: Secondary | ICD-10-CM

## 2023-02-12 DIAGNOSIS — I4891 Unspecified atrial fibrillation: Secondary | ICD-10-CM

## 2023-02-12 DIAGNOSIS — I5022 Chronic systolic (congestive) heart failure: Secondary | ICD-10-CM

## 2023-02-21 ENCOUNTER — Ambulatory Visit: Payer: Medicare Other

## 2023-02-21 ENCOUNTER — Ambulatory Visit (INDEPENDENT_AMBULATORY_CARE_PROVIDER_SITE_OTHER): Payer: Medicare Other

## 2023-02-21 DIAGNOSIS — Z952 Presence of prosthetic heart valve: Secondary | ICD-10-CM | POA: Diagnosis not present

## 2023-02-21 DIAGNOSIS — I5022 Chronic systolic (congestive) heart failure: Secondary | ICD-10-CM

## 2023-02-21 DIAGNOSIS — Z5181 Encounter for therapeutic drug level monitoring: Secondary | ICD-10-CM | POA: Diagnosis not present

## 2023-02-21 DIAGNOSIS — Z95810 Presence of automatic (implantable) cardiac defibrillator: Secondary | ICD-10-CM

## 2023-02-21 LAB — POCT INR: INR: 3.2 — AB (ref 2.0–3.0)

## 2023-02-21 NOTE — Patient Instructions (Signed)
Description   Spoke with pt and instructed him to continue taking warfarin 1/2 tablet daily except for 1 tablet on Mondays, Wednesdays, and Fridays.  Recheck INR in 2 weeks.  Coumadin Clinic [phone].  AMIO DOSE 200MG  DAILY, 12/07/22 12/08/22-Amio 200mg  qd none Wed/Sun   Pt's cell phone [phone] in case difficulty reaching pt on home phone.

## 2023-02-21 NOTE — Progress Notes (Signed)
EPIC Encounter for ICM Monitoring  Patient Name: Austin [name] is a 82 y.o. male Date: 02/21/2023 Primary Care Physican: [name], MD Primary Cardiologist: Smith/McLean Electrophysiologist: Ladona Ridgel 08/10/2022 Weight: 202 lbs 09/01/2022   Weight: 202 lbs 09/28/2022   Weight: 200 lbs 11/09/2022 Weight: 192.7 lbs 12/22/2022 Weight: 191 lbs 02/21/2023 Weight: 193.5 lbs                Since 03-Feb-2023 Time in AT/AF  0.0 hr/day (0.0%)         Spoke with patient and heart failure questions reviewed.  Transmission results reviewed.  Pt asymptomatic for fluid accumulation.  Reports feeling well at this time and voices no complaints.  Weight increased from 192 lbs to 194 lbs this past week.  It has not returned to baseline yet.     Optivol Thoracic impedance suggesting possible fluid accumulation starting 7/22.   Prescribed:  Furosemide 20 mg Take 3 tablets (60 mg total) by mouth twice a day.   He self adjusts Furosemide when needed.  Spironolactone 25 mg take 0.5 tablet by mouth daily   Labs: 02/03/2023 Creatinine 2.57, BUN 50, Potassium 4.7, Sodium 133, GFR 25, BNP 453.3 12/31/2022 Creatinine 2.74, BUN 57, Potassium 4.1, Sodium 135, GFR 23 12/08/2022 Creatinine 2.33, BUN 42, Potassium 4.3, Sodium 135, GFR 28 11/18/2022 Creatinine 2.88, BUN 62, Potassium 4.0, Sodium 136, GFR 21 11/08/2022 Creatinine 2.46, BUN 55, Potassium 4.1, Sodium 134, GFR 26, BNP 528.8 10/19/2022 Creatinine 2.08, BUN 47, Potassium 4.8, Sodium 136, GFR 32  10/08/2022 Creatinine 2.48, BUN 47, Potassium 3.7, Sodium 135, GFR 26  09/27/2022 Creatinine 2.26, BUN 53, Potassium 4.9, Sodium 138, GFR 26.83 A complete set of results can be found in Results Review.   Recommendations:  Recommendation to limit salt intake to 2000 mg daily and fluid intake to 64 oz daily.  Encouraged to call if experiencing any fluid symptoms.     Follow-up plan: ICM clinic phone appointment on 02/28/2023 (manual) to recheck fluid levels.   91 day  device clinic remote transmission 03/23/2023.      EP/Cardiology Office Visits:   03/30/2023 with HF clinic.  06/01/2023 with Dr Ladona Ridgel.     Copy of ICM check sent to Dr. Ladona Ridgel.     3 month ICM trend: 02/21/2023.    12-14 Month ICM trend:     Karie Soda, RN 02/21/2023 11:08 AM

## 2023-02-22 ENCOUNTER — Other Ambulatory Visit: Payer: Self-pay | Admitting: Internal Medicine

## 2023-02-23 ENCOUNTER — Encounter (INDEPENDENT_AMBULATORY_CARE_PROVIDER_SITE_OTHER): Payer: Self-pay

## 2023-02-28 ENCOUNTER — Ambulatory Visit: Payer: Medicare Other | Attending: Internal Medicine

## 2023-02-28 DIAGNOSIS — I5022 Chronic systolic (congestive) heart failure: Secondary | ICD-10-CM

## 2023-02-28 DIAGNOSIS — Z95810 Presence of automatic (implantable) cardiac defibrillator: Secondary | ICD-10-CM

## 2023-03-01 ENCOUNTER — Ambulatory Visit: Payer: Medicare Other | Admitting: Podiatry

## 2023-03-01 DIAGNOSIS — L97512 Non-pressure chronic ulcer of other part of right foot with fat layer exposed: Secondary | ICD-10-CM | POA: Diagnosis not present

## 2023-03-01 MED ORDER — MUPIROCIN 2 % EX OINT: 1.0000 | TOPICAL_OINTMENT | Freq: Every day | CUTANEOUS | 2 refills | Status: DC

## 2023-03-01 NOTE — Progress Notes (Signed)
EPIC Encounter for ICM Monitoring  Patient Name: Austin [name] is a 82 y.o. male Date: 03/01/2023 Primary Care Physican: [name], MD Primary Cardiologist: Smith/McLean Electrophysiologist: Ladona Ridgel 08/10/2022 Weight: 202 lbs 09/01/2022   Weight: 202 lbs 09/28/2022   Weight: 200 lbs 11/09/2022 Weight: 192.7 lbs 12/22/2022 Weight: 191 lbs 02/21/2023 Weight: 193.5 lbs 03/01/2023 Weight: 192 lbs   Since 21-Feb-2023 Time in AT/AF  0.0 hr/day (0.0%)           Spoke with patient and heart failure questions reviewed.  Transmission results reviewed.  Pt asymptomatic for fluid accumulation.  Reports feeling well at this time and voices no complaints.   It has not returned to baseline yet.     Optivol Thoracic impedance suggesting fluid levels returned close to normal.   Prescribed:  Furosemide 20 mg Take 3 tablets (60 mg total) by mouth twice a day.   He self adjusts Furosemide when needed.  Spironolactone 25 mg take 0.5 tablet by mouth daily   Labs: 02/03/2023 Creatinine 2.57, BUN 50, Potassium 4.7, Sodium 133, GFR 25, BNP 453.3 12/31/2022 Creatinine 2.74, BUN 57, Potassium 4.1, Sodium 135, GFR 23 12/08/2022 Creatinine 2.33, BUN 42, Potassium 4.3, Sodium 135, GFR 28 11/18/2022 Creatinine 2.88, BUN 62, Potassium 4.0, Sodium 136, GFR 21 11/08/2022 Creatinine 2.46, BUN 55, Potassium 4.1, Sodium 134, GFR 26, BNP 528.8 10/19/2022 Creatinine 2.08, BUN 47, Potassium 4.8, Sodium 136, GFR 32  10/08/2022 Creatinine 2.48, BUN 47, Potassium 3.7, Sodium 135, GFR 26  09/27/2022 Creatinine 2.26, BUN 53, Potassium 4.9, Sodium 138, GFR 26.83 A complete set of results can be found in Results Review.   Recommendations:  R Encouraged to call if experiencing any fluid symptoms.     Follow-up plan: ICM clinic phone appointment on 03/29/2023.   91 day device clinic remote transmission 03/23/2023.      EP/Cardiology Office Visits:   03/30/2023 with HF clinic.  06/01/2023 with Dr Ladona Ridgel.     Copy of ICM check sent  to Dr. Ladona Ridgel.      3 month ICM trend: 03/01/2023.    12-14 Month ICM trend:     Karie Soda, RN 03/01/2023 3:13 PM

## 2023-03-02 NOTE — Progress Notes (Signed)
  Subjective:  Patient ID: Austin [name], male    DOB: [birth year],  MRN: [id number]  Chief Complaint  Patient presents with   Callouses    Pt stated the callouse has not healed at all. There is discharge coming out of the callouse almost everyday and the pain wont go away.    82 y.o. male presents with the above complaint. History confirmed with patient.  He returns for follow-up the wound has returned  Physical Exam: warm, good capillary refill, no trophic changes or ulcerative lesions, +1 pitting edema, he has nonpalpable DP and PT pulses, normal sensory exam, and  Ulcer lateral fifth metatarsal head is healed  ABIs unchanged since previous examination    Assessment:   1. 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  Ulcer right foot -Wound has returned.  He has a significant tailor's bunion deformity which is a pressure point for him.  May need surgical intervention at some point with metatarsal head resection and/or osteotomy to improve this.  Continue local wound care with mupirocin for now.  I will see him back in 3 weeks for follow-up.     Return in about 3 weeks (around 03/22/2023) for wound care.

## 2023-03-03 ENCOUNTER — Other Ambulatory Visit: Payer: Self-pay | Admitting: Internal Medicine

## 2023-03-03 DIAGNOSIS — I4891 Unspecified atrial fibrillation: Secondary | ICD-10-CM

## 2023-03-03 DIAGNOSIS — Z95810 Presence of automatic (implantable) cardiac defibrillator: Secondary | ICD-10-CM

## 2023-03-03 DIAGNOSIS — G4733 Obstructive sleep apnea (adult) (pediatric): Secondary | ICD-10-CM | POA: Diagnosis not present

## 2023-03-03 DIAGNOSIS — I5022 Chronic systolic (congestive) heart failure: Secondary | ICD-10-CM

## 2023-03-03 NOTE — Telephone Encounter (Signed)
This is a CHF pt 

## 2023-03-07 ENCOUNTER — Ambulatory Visit (INDEPENDENT_AMBULATORY_CARE_PROVIDER_SITE_OTHER): Payer: Medicare Other

## 2023-03-07 DIAGNOSIS — Z5181 Encounter for therapeutic drug level monitoring: Secondary | ICD-10-CM

## 2023-03-07 LAB — POCT INR: INR: 3.6 — AB (ref 2.0–3.0)

## 2023-03-07 NOTE — Patient Instructions (Signed)
Description   Spoke with pt and instructed him to eat an extra serving of greens today and continue taking warfarin 1/2 tablet daily except for 1 tablet on Mondays, Wednesdays, and Fridays.  Recheck INR in 2 weeks.  Coumadin Clinic [phone].  AMIO DOSE 200MG  DAILY, 12/07/22 12/08/22-Amio 200mg  qd none Wed/Sun   Pt's cell phone [phone] in case difficulty reaching pt on home phone.

## 2023-03-08 ENCOUNTER — Encounter: Payer: Self-pay | Admitting: Podiatry

## 2023-03-08 ENCOUNTER — Ambulatory Visit (INDEPENDENT_AMBULATORY_CARE_PROVIDER_SITE_OTHER): Payer: Medicare Other

## 2023-03-08 VITALS — Ht 73.5 in | Wt 192.0 lb

## 2023-03-08 DIAGNOSIS — L97512 Non-pressure chronic ulcer of other part of right foot with fat layer exposed: Secondary | ICD-10-CM

## 2023-03-08 DIAGNOSIS — Z0000 Encounter for general adult medical examination without abnormal findings: Secondary | ICD-10-CM

## 2023-03-08 NOTE — Patient Instructions (Signed)
Austin [name] , Thank you for taking time to come for your Medicare Wellness Visit. I appreciate your ongoing commitment to your health goals. Please review the following plan we discussed and let me know if I can assist you in the future.   Referrals/Orders/Follow-Ups/Clinician Recommendations: Remember that you are due for Covid, Shingles, Flu and Tetanus vaccines.  You can have these done at your local pharmacy.  Each day, aim for 6 glasses of water, plenty of protein in your diet and try to get up and walk/ stretch every hour for 5-10 minutes at a time.    This is a list of the screening recommended for you and due dates:  Health Maintenance  Topic Date Due   DTaP/Tdap/Td vaccine (1 - Tdap) Never done   Zoster (Shingles) Vaccine (1 of 2) Never done   Pneumonia Vaccine (1 of 1 - PCV) Never done   COVID-19 Vaccine (3 - Pfizer risk series) 10/01/2019   Flu Shot  02/24/2023   Medicare Annual Wellness Visit  03/07/2024   HPV Vaccine  Aged Out    Advanced directives: (In Chart) A copy of your advanced directives are scanned into your chart should your provider ever need it.  Next Medicare Annual Wellness Visit scheduled for next year: Yes  Preventive Care 8 Years and Older, Male  Preventive care refers to lifestyle choices and visits with your health care provider that can promote health and wellness. What does preventive care include? A yearly physical exam. This is also called an annual well check. Dental exams once or twice a year. Routine eye exams. Ask your health care provider how often you should have your eyes checked. Personal lifestyle choices, including: Daily care of your teeth and gums. Regular physical activity. Eating a healthy diet. Avoiding tobacco and drug use. Limiting alcohol use. Practicing safe sex. Taking low doses of aspirin every day. Taking vitamin and mineral supplements as recommended by your health care provider. What happens during an annual well  check? The services and screenings done by your health care provider during your annual well check will depend on your age, overall health, lifestyle risk factors, and family history of disease. Counseling  Your health care provider may ask you questions about your: Alcohol use. Tobacco use. Drug use. Emotional well-being. Home and relationship well-being. Sexual activity. Eating habits. History of falls. Memory and ability to understand (cognition). Work and work Astronomer. Screening  You may have the following tests or measurements: Height, weight, and BMI. Blood pressure. Lipid and cholesterol levels. These may be checked every 5 years, or more frequently if you are over 40 years old. Skin check. Lung cancer screening. You may have this screening every year starting at age 21 if you have a 30-pack-year history of smoking and currently smoke or have quit within the past 15 years. Fecal occult blood test (FOBT) of the stool. You may have this test every year starting at age 32. Flexible sigmoidoscopy or colonoscopy. You may have a sigmoidoscopy every 5 years or a colonoscopy every 10 years starting at age 47. Prostate cancer screening. Recommendations will vary depending on your family history and other risks. Hepatitis C blood test. Hepatitis B blood test. Sexually transmitted disease (STD) testing. Diabetes screening. This is done by checking your blood sugar (glucose) after you have not eaten for a while (fasting). You may have this done every 1-3 years. Abdominal aortic aneurysm (AAA) screening. You may need this if you are a current or former smoker. Osteoporosis.  You may be screened starting at age 37 if you are at high risk. Talk with your health care provider about your test results, treatment options, and if necessary, the need for more tests. Vaccines  Your health care provider may recommend certain vaccines, such as: Influenza vaccine. This is recommended every  year. Tetanus, diphtheria, and acellular pertussis (Tdap, Td) vaccine. You may need a Td booster every 10 years. Zoster vaccine. You may need this after age 17. Pneumococcal 13-valent conjugate (PCV13) vaccine. One dose is recommended after age 4. Pneumococcal polysaccharide (PPSV23) vaccine. One dose is recommended after age 35. Talk to your health care provider about which screenings and vaccines you need and how often you need them. This information is not intended to replace advice given to you by your health care provider. Make sure you discuss any questions you have with your health care provider. Document Released: 08/08/2015 Document Revised: 03/31/2016 Document Reviewed: 05/13/2015 Elsevier Interactive Patient Education  2017 [employer].  Fall Prevention in the Home Falls can cause injuries. They can happen to people of all ages. There are many things you can do to make your home safe and to help prevent falls. What can I do on the outside of my home? Regularly fix the edges of walkways and driveways and fix any cracks. Remove anything that might make you trip as you walk through a door, such as a raised step or threshold. Trim any bushes or trees on the path to your home. Use bright outdoor lighting. Clear any walking paths of anything that might make someone trip, such as rocks or tools. Regularly check to see if handrails are loose or broken. Make sure that both sides of any steps have handrails. Any raised decks and porches should have guardrails on the edges. Have any leaves, snow, or ice cleared regularly. Use sand or salt on walking paths during winter. Clean up any spills in your garage right away. This includes oil or grease spills. What can I do in the bathroom? Use night lights. Install grab bars by the toilet and in the tub and shower. Do not use towel bars as grab bars. Use non-skid mats or decals in the tub or shower. If you need to sit down in the shower, use a  plastic, non-slip stool. Keep the floor dry. Clean up any water that spills on the floor as soon as it happens. Remove soap buildup in the tub or shower regularly. Attach bath mats securely with double-sided non-slip rug tape. Do not have throw rugs and other things on the floor that can make you trip. What can I do in the bedroom? Use night lights. Make sure that you have a light by your bed that is easy to reach. Do not use any sheets or blankets that are too big for your bed. They should not hang down onto the floor. Have a firm chair that has side arms. You can use this for support while you get dressed. Do not have throw rugs and other things on the floor that can make you trip. What can I do in the kitchen? Clean up any spills right away. Avoid walking on wet floors. Keep items that you use a lot in easy-to-reach places. If you need to reach something above you, use a strong step stool that has a grab bar. Keep electrical cords out of the way. Do not use floor polish or wax that makes floors slippery. If you must use wax, use non-skid floor wax.  Do not have throw rugs and other things on the floor that can make you trip. What can I do with my stairs? Do not leave any items on the stairs. Make sure that there are handrails on both sides of the stairs and use them. Fix handrails that are broken or loose. Make sure that handrails are as long as the stairways. Check any carpeting to make sure that it is firmly attached to the stairs. Fix any carpet that is loose or worn. Avoid having throw rugs at the top or bottom of the stairs. If you do have throw rugs, attach them to the floor with carpet tape. Make sure that you have a light switch at the top of the stairs and the bottom of the stairs. If you do not have them, ask someone to add them for you. What else can I do to help prevent falls? Wear shoes that: Do not have high heels. Have rubber bottoms. Are comfortable and fit you  well. Are closed at the toe. Do not wear sandals. If you use a stepladder: Make sure that it is fully opened. Do not climb a closed stepladder. Make sure that both sides of the stepladder are locked into place. Ask someone to hold it for you, if possible. Clearly mark and make sure that you can see: Any grab bars or handrails. First and last steps. Where the edge of each step is. Use tools that help you move around (mobility aids) if they are needed. These include: Canes. Walkers. Scooters. Crutches. Turn on the lights when you go into a dark area. Replace any light bulbs as soon as they burn out. Set up your furniture so you have a clear path. Avoid moving your furniture around. If any of your floors are uneven, fix them. If there are any pets around you, be aware of where they are. Review your medicines with your doctor. Some medicines can make you feel dizzy. This can increase your chance of falling. Ask your doctor what other things that you can do to help prevent falls. This information is not intended to replace advice given to you by your health care provider. Make sure you discuss any questions you have with your health care provider. Document Released: 05/08/2009 Document Revised: 12/18/2015 Document Reviewed: 08/16/2014 Elsevier Interactive Patient Education  2017 [employer].

## 2023-03-08 NOTE — Progress Notes (Deleted)
Subjective:   Austin [name] is a 82 y.o. male who presents for an Initial Medicare Annual Wellness Visit.  Visit Complete: Virtual  I connected with  Austin [name] on 03/08/23 by a audio enabled telemedicine application and verified that I am speaking with the correct person using two identifiers.  Patient Location: Home  Provider Location: Home Office  I discussed the limitations of evaluation and management by telemedicine. The patient expressed understanding and agreed to proceed.  Vital Signs: Vital signs are patient reported.   Review of Systems   Cardiac Risk Factors include: advanced age (>74men, >69 women);male gender, Risk factor comments: A-Fib, CSHF, OSA, CKD     Objective:    Today's Vitals   03/08/23 1409  Weight: 192 lb (87.1 kg)  Height: 6' 1.5" (1.867 m)   Body mass index is 24.99 kg/m.     03/08/2023    2:18 PM 02/26/2021   10:56 AM 12/20/2018    1:39 PM 10/12/2018    5:02 PM 05/04/2017    8:48 AM 10/26/2015   10:45 PM 10/19/2015    3:51 PM  Advanced Directives  Does Patient Have a Medical Advance Directive? Yes Yes Yes Yes Yes Yes No;Yes  Type of Estate agent of [place];Living will Healthcare Power of [place];Living will Healthcare Power of [place];Living will Living will;Healthcare Power of Attorney Living will;Healthcare Power of [employer] Power of [place];Living will  Does patient want to make changes to medical advance directive? No - Patient declined No - Patient declined  No - Patient declined No - Patient declined Yes - information given No - Patient declined  Copy of Healthcare Power of Attorney in Chart? No - copy requested   No - copy requested No - copy requested Yes No - copy requested    Current Medications (verified) Outpatient Encounter Medications as of 03/08/2023  Medication Sig   acetaminophen (TYLENOL) 500 MG tablet Take 1,000 mg by mouth every 6 (six) hours as needed (pain).   amiodarone  (PACERONE) 200 MG tablet Take 1 tablet (200 mg total) by mouth daily. Do NOT take Amiodarone on Wednesday or Sunday.   furosemide (LASIX) 20 MG tablet Take 3 tablets (60 mg total) by mouth 2 (two) times daily.   levothyroxine (SYNTHROID) 88 MCG tablet TAKE 1 TABLET BY MOUTH EVERY DAY   melatonin 5 MG TABS 5 mg at bedtime as needed.   metoprolol succinate (TOPROL-XL) 25 MG 24 hr tablet Take 0.5 tablets (12.5 mg total) by mouth daily.   Multiple Vitamin (MULTIVITAMIN WITH MINERALS) TABS tablet Take 1 tablet by mouth in the morning.   mupirocin ointment (BACTROBAN) 2 % Apply 1 Application topically daily.   nitroGLYCERIN (NITROSTAT) 0.4 MG SL tablet Place 1 tablet (0.4 mg total) under the tongue every 5 (five) minutes x 3 doses as needed for chest pain.   quiniDINE gluconate 324 MG CR tablet TAKE 1 TABLET BY MOUTH 2 TIMES DAILY.   spironolactone (ALDACTONE) 25 MG tablet TAKE 1/2 TABLET BY MOUTH EVERY DAY   valsartan (DIOVAN) 40 MG tablet Take 20 mg by mouth daily.   warfarin (COUMADIN) 5 MG tablet TAKE AS DIRECTED BY THE COUMADIN CLINIC   zinc gluconate 50 MG tablet Take 1 tablet (50 mg total) by mouth daily.   amoxicillin (AMOXIL) 500 MG capsule TAKE 4 CAPSULES BY MOUTH 30 MINUTES PRIOR TO DENTAL WORK (Patient not taking: Reported on 03/08/2023)   No facility-administered encounter medications on file as of 03/08/2023.  Allergies (verified) Patient has no known allergies.   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    cx by infection, explantation11/12 & reimplant 1/13   Conductive hearing loss    Intraspinal abscess    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   Nonischemic cardiomyopathy (HCC)    Psychosexual dysfunction with inhibited sexual excitement    S/P mitral valve replacement     Syncope and collapse    Unspecified sleep apnea    last sleep study 11/07   Ventricular tachycardia (HCC)    VT (ventricular tachycardia) (HCC)    Past Surgical History:  Procedure Laterality Date   BIV ICD GENERATOR CHANGEOUT N/A 05/04/2017   Procedure: BiV ICD Generator Changeout;  Surgeon: Marinus Maw, MD;  Location: [facility] INVASIVE CV LAB;  Service: Cardiovascular;  Laterality: N/A;   CARDIAC CATHETERIZATION  04/24/2002   CARDIAC VALVE REPLACEMENT     CARDIOVERSION  03/09/2012   Procedure: CARDIOVERSION;  Surgeon: Marinus Maw, MD;  Location: [facility] ENDOSCOPY;  Service: Cardiovascular;  Laterality: N/A;   CARDIOVERSION N/A 11/22/2013   Procedure: CARDIOVERSION;  Surgeon: Thurmon Fair, MD;  Location: [facility] ENDOSCOPY;  Service: Cardiovascular;  Laterality: N/A;   dental implants     ELECTROPHYSIOLOGIC STUDY N/A 09/15/2015   Procedure: Floyce Stakes Ablation;  Surgeon: Marinus Maw, MD;  Location: [facility] INVASIVE CV LAB;  Service: Cardiovascular;  Laterality: N/A;   Evacution of epidural lumbar epidural abscess  1999   INSERT / REPLACE / REMOVE PACEMAKER  11/2008   MITRAL VALVE REPLACEMENT     w [id number]   PERMANENT PACEMAKER INSERTION N/A 08/05/2011   Procedure: PERMANENT PACEMAKER INSERTION;  Surgeon: Marinus Maw, MD;  Location: [facility] CATH LAB;  Service: Cardiovascular;  Laterality: N/A;   RIGHT HEART CATH N/A 02/26/2021   Procedure: RIGHT HEART CATH;  Surgeon: Laurey Morale, MD;  Location: [facility] INVASIVE CV LAB;  Service: Cardiovascular;  Laterality: N/A;   THYROIDECTOMY     TONSILLECTOMY     VALVE REPLACEMENT  2000   Family History  Problem Relation Age of Onset   Heart disease Mother    Heart failure Mother    Heart disease Father    Heart failure Father    Sleep apnea Neg Hx    Social History   Socioeconomic History   Marital status: Married    Spouse name: Not on file   Number of children: 1   Years of education: 49   Highest education level: Master's degree (e.g., MA, MS, MEng,  MEd, MSW, MBA)  Occupational History   Occupation: retired from Consulting civil engineer  Tobacco Use   Smoking status: Never   Smokeless tobacco: Never  Vaping Use   Vaping status: Never Used  Substance and Sexual Activity   Alcohol use: Yes    Alcohol/week: 0.0 standard drinks of alcohol    Comment: occasionally   Drug use: No   Sexual activity: Not on file  Other Topics Concern   Not on file  Social History Narrative   Lives with wife in a one story home.  Has one daughter.  Retired.  Education: Masters.   Social Determinants of Health   Financial Resource Strain: Low Risk  (03/08/2023)   Overall Financial Resource Strain (CARDIA)    Difficulty of Paying Living Expenses: Not hard at  all  Food Insecurity: No Food Insecurity (03/08/2023)   Hunger Vital Sign    Worried About Running Out of Food in the Last Year: Never true    Ran Out of Food in the Last Year: Never true  Transportation Needs: No Transportation Needs (03/08/2023)   PRAPARE - Administrator, Civil Service (Medical): No    Lack of Transportation (Non-Medical): No  Physical Activity: Insufficiently Active (03/08/2023)   Exercise Vital Sign    Days of Exercise per Week: 4 days    Minutes of Exercise per Session: 30 min  Stress: No Stress Concern Present (03/08/2023)   [employer] of Occupational Health - Occupational Stress Questionnaire    Feeling of Stress : Not at all  Social Connections: Moderately Integrated (03/08/2023)   Social Connection and Isolation Panel [NHANES]    Frequency of Communication with Friends and Family: More than three times a week    Frequency of Social Gatherings with Friends and Family: Twice a week    Attends Religious Services: More than 4 times per year    Active Member of [employer] or Organizations: No    Attends Engineer, structural: Never    Marital Status: Married    Tobacco Counseling Counseling given: Not Answered   Clinical Intake:  Pre-visit  preparation completed: Yes  Pain : No/denies pain     BMI - recorded: 24.99 Nutritional Status: BMI 25 -29 Overweight Nutritional Risks: None Diabetes: No  How often do you need to have someone help you when you read instructions, pamphlets, or other written materials from your doctor or pharmacy?: 1 - Never  Interpreter Needed?: No  Information entered by ::  , RMA   Activities of Daily Living    03/08/2023    2:12 PM  In your present state of health, do you have any difficulty performing the following activities:  Hearing? 1  Vision? 0  Difficulty concentrating or making decisions? 0  Walking or climbing stairs? 1  Comment some SOB-per pt  Dressing or bathing? 0  Doing errands, shopping? 0  Preparing Food and eating ? N  Using the Toilet? N  In the past six months, have you accidently leaked urine? N  Do you have problems with loss of bowel control? N  Managing your Medications? N  Managing your Finances? N  Housekeeping or managing your Housekeeping? N    Patient Care Team: [name], MD as PCP - General (Internal Medicine) Lyn Records, MD (Inactive) as PCP - Cardiology (Cardiology) Marinus Maw, MD as PCP - Electrophysiology (Cardiology)  Indicate any recent Medical Services you may have received from other than [facility] providers in the past year (date may be approximate).     Assessment:   This is a routine wellness examination for [place].  Hearing/Vision screen Hearing Screening - Comments:: Wears hearing aides Vision Screening - Comments:: Denies any vision issues.   Dietary issues and exercise activities discussed:     Goals Addressed               This Visit's Progress     Patient Stated (pt-stated)        Staying healthy      Depression Screen    03/08/2023    2:23 PM 09/27/2022    3:33 PM 05/17/2022    9:49 AM 06/09/2016    9:53 AM 03/15/2016   11:37 AM  PHQ 2/9 Scores  PHQ - 2 Score 0 0 0 0 0  PHQ- 9 Score 1   1      Fall Risk    03/08/2023    2:19 PM 09/27/2022    3:33 PM 05/17/2022    9:50 AM 12/20/2018    1:39 PM 08/25/2018    2:59 PM  Fall Risk   Falls in the past year? 0 0 0 0 0  Number falls in past yr: 0 0 0  0  Injury with Fall? 0 0 0 0 0  Risk for fall due to : No Fall Risks No Fall Risks     Follow up Falls prevention discussed;Falls evaluation completed Falls evaluation completed   Falls evaluation completed    MEDICARE RISK AT HOME:  Medicare Risk at Home - 03/08/23 1419     Any stairs in or around the home? No    Home free of loose throw rugs in walkways, pet beds, electrical cords, etc? Yes    Adequate lighting in your home to reduce risk of falls? Yes    Life alert? No    Use of a cane, walker or w/c? No    Grab bars in the bathroom? Yes    Shower chair or bench in shower? Yes    Elevated toilet seat or a handicapped toilet? Yes             TIMED UP AND GO:  Was the test performed? No    Cognitive Function:        03/08/2023    2:19 PM  6CIT Screen  What Year? 0 points  What month? 0 points  What time? 0 points  Count back from 20 0 points  Months in reverse 2 points  Repeat phrase 2 points  Total Score 4 points    Immunizations Immunization History  Administered Date(s) Administered   Fluad Quad(high Dose 65+) 05/17/2022   Influenza, High Dose Seasonal PF 05/11/2017, 04/18/2018, 05/04/2019   PFIZER(Purple Top)SARS-COV-2 Vaccination 08/14/2019, 09/03/2019    TDAP status: Due, Education has been provided regarding the importance of this vaccine. Advised may receive this vaccine at local pharmacy or Health Dept. Aware to provide a copy of the vaccination record if obtained from local pharmacy or Health Dept. Verbalized acceptance and understanding.  Flu Vaccine status: Due, Education has been provided regarding the importance of this vaccine. Advised may receive this vaccine at local pharmacy or Health Dept. Aware to provide a copy of the  vaccination record if obtained from local pharmacy or Health Dept. Verbalized acceptance and understanding.  Pneumococcal vaccine status: Due, Education has been provided regarding the importance of this vaccine. Advised may receive this vaccine at local pharmacy or Health Dept. Aware to provide a copy of the vaccination record if obtained from local pharmacy or Health Dept. Verbalized acceptance and understanding.  Covid-19 vaccine status: Completed vaccines  Qualifies for Shingles Vaccine? Yes   Zostavax completed No   Shingrix Completed?: No.    Education has been provided regarding the importance of this vaccine. Patient has been advised to call insurance company to determine out of pocket expense if they have not yet received this vaccine. Advised may also receive vaccine at local pharmacy or Health Dept. Verbalized acceptance and understanding.  Screening Tests Health Maintenance  Topic Date Due   DTaP/Tdap/Td (1 - Tdap) Never done   Zoster Vaccines- Shingrix (1 of 2) Never done   Pneumonia Vaccine 32+ Years old (1 of 1 - PCV) Never done   COVID-19 Vaccine (3 - Pfizer risk series) 10/01/2019  INFLUENZA VACCINE  02/24/2023   Medicare Annual Wellness (AWV)  03/07/2024   HPV VACCINES  Aged Out    Health Maintenance  Health Maintenance Due  Topic Date Due   DTaP/Tdap/Td (1 - Tdap) Never done   Zoster Vaccines- Shingrix (1 of 2) Never done   Pneumonia Vaccine 15+ Years old (1 of 1 - PCV) Never done   COVID-19 Vaccine (3 - Pfizer risk series) 10/01/2019   INFLUENZA VACCINE  02/24/2023    Colorectal cancer screening: No longer required.   Lung Cancer Screening: (Low Dose CT Chest recommended if Age 73-80 years, 20 pack-year currently smoking OR have quit w/in 15years.) does not qualify.   Lung Cancer Screening Referral: N/A  Additional Screening:  Hepatitis C Screening: does qualify; Completed N/A  Vision Screening: Recommended annual ophthalmology exams for early detection  of glaucoma and other disorders of the eye. Is the patient up to date with their annual eye exam?  Yes  Who is the provider or what is the name of the office in which the patient attends annual eye exams? Dr. Cathey Endow If pt is not established with a provider, would they like to be referred to a provider to establish care? No .   Dental Screening: Recommended annual dental exams for proper oral hygiene   Community Resource Referral / Chronic Care Management: CRR required this visit?  No   CCM required this visit?  No    Plan:     I have personally reviewed and noted the following in the patient's chart:   Medical and social history Use of alcohol, tobacco or illicit drugs  Current medications and supplements including opioid prescriptions. Patient is not currently taking opioid prescriptions. Functional ability and status Nutritional status Physical activity Advanced directives List of other physicians Hospitalizations, surgeries, and ER visits in previous 12 months Vitals Screenings to include cognitive, depression, and falls Referrals and appointments  In addition, I have reviewed and discussed with patient certain preventive protocols, quality metrics, and best practice recommendations. A written personalized care plan for preventive services as well as general preventive health recommendations were provided to patient.      L , CMA   03/08/2023   After Visit Summary: (MyChart) Due to this being a telephonic visit, the after visit summary with patients personalized plan was offered to patient via MyChart   Nurse Notes: Patient is due Tdap, Flu, Covid and Shingrix vaccines.  He has an eye appointment coming up soon with Dr. Cathey Endow.  Patient stated he would call office for a follow up visit with Dr. Yetta Barre.

## 2023-03-08 NOTE — Addendum Note (Signed)
Addended byLilian Kapur,  R on: 03/08/2023 08:48 PM   Modules accepted: Orders

## 2023-03-10 DIAGNOSIS — Z85828 Personal history of other malignant neoplasm of skin: Secondary | ICD-10-CM | POA: Diagnosis not present

## 2023-03-10 DIAGNOSIS — D692 Other nonthrombocytopenic purpura: Secondary | ICD-10-CM | POA: Diagnosis not present

## 2023-03-10 DIAGNOSIS — C44722 Squamous cell carcinoma of skin of right lower limb, including hip: Secondary | ICD-10-CM | POA: Diagnosis not present

## 2023-03-10 DIAGNOSIS — L821 Other seborrheic keratosis: Secondary | ICD-10-CM | POA: Diagnosis not present

## 2023-03-10 DIAGNOSIS — C44622 Squamous cell carcinoma of skin of right upper limb, including shoulder: Secondary | ICD-10-CM | POA: Diagnosis not present

## 2023-03-10 DIAGNOSIS — L565 Disseminated superficial actinic porokeratosis (DSAP): Secondary | ICD-10-CM | POA: Diagnosis not present

## 2023-03-10 DIAGNOSIS — L570 Actinic keratosis: Secondary | ICD-10-CM | POA: Diagnosis not present

## 2023-03-10 DIAGNOSIS — D485 Neoplasm of uncertain behavior of skin: Secondary | ICD-10-CM | POA: Diagnosis not present

## 2023-03-10 DIAGNOSIS — L853 Xerosis cutis: Secondary | ICD-10-CM | POA: Diagnosis not present

## 2023-03-11 ENCOUNTER — Encounter: Payer: Self-pay | Admitting: Internal Medicine

## 2023-03-11 ENCOUNTER — Other Ambulatory Visit: Payer: Self-pay

## 2023-03-11 ENCOUNTER — Telehealth: Payer: Self-pay | Admitting: *Deleted

## 2023-03-11 ENCOUNTER — Telehealth: Payer: Self-pay

## 2023-03-11 DIAGNOSIS — I5042 Chronic combined systolic (congestive) and diastolic (congestive) heart failure: Secondary | ICD-10-CM

## 2023-03-11 MED ORDER — ENOXAPARIN SODIUM 80 MG/0.8ML IJ SOSY: 80.0000 mg | PREFILLED_SYRINGE | Freq: Two times a day (BID) | INTRAMUSCULAR | 1 refills | Status: DC

## 2023-03-11 NOTE — Telephone Encounter (Signed)
I spoke with the patient and reviewed Lovenox instructions.  He verbalized understanding and would appreciate a call Monday for review.

## 2023-03-11 NOTE — Telephone Encounter (Signed)
Patient with diagnosis of atrial fibrillation and Mechanical Mitral Valve on warfarin for anticoagulation.    Procedure:   Left Barostim Insertion   Date of Surgery:  Clearance 03/17/23   CHA2DS2-VASc Score = 3   This indicates a 3.2% annual risk of stroke. The patient's score is based upon: CHF History: 1 HTN History: 0 Diabetes History: 0 Stroke History: 0 Vascular Disease History: 0 Age Score: 2 Gender Score: 0    CrCl 28 Platelet count 146  Per office protocol, patient can hold warfarin for 5 days prior to procedure.   Patient WILL need bridging with Lovenox (enoxaparin) around procedure.  Pt is self tester, monitored by [facility], they have been notified.    **This guidance is not considered finalized until pre-operative APP has relayed final recommendations.**

## 2023-03-11 NOTE — Telephone Encounter (Signed)
Patient scheduled for left barostim with [name] on 03/17/23 at [facility]. [employer] device rep, Christean Leaf and [employer] rep notified of date/time.

## 2023-03-11 NOTE — Telephone Encounter (Signed)
   Pre-operative Risk Assessment    Patient Name: Austin [name]  DOB: 30-May-1941 MRN: [id number]      Request for Surgical Clearance    Procedure:   Left Barostim Insertion  Date of Surgery:  Clearance 03/17/23                                 Surgeon:  [name] Surgeon's Group or Practice Name:  [facility] Phone number:  [phone] Fax number:  [phone]   Type of Clearance Requested:   - Medical  - Pharmacy:  Hold Warfarin (Coumadin) D days prior.   Type of Anesthesia:  General    Additional requests/questions:    Signed, Emmit Pomfret   03/11/2023, 12:14 PM

## 2023-03-11 NOTE — Telephone Encounter (Signed)
     Primary Cardiologist: Lesleigh Noe, MD (Inactive)  Chart reviewed as part of pre-operative protocol coverage. Given past medical history and time since last visit, based on ACC/AHA guidelines, Austin [name] would be at acceptable risk for the planned procedure without further cardiovascular testing.   Patient with diagnosis of atrial fibrillation and Mechanical Mitral Valve on warfarin for anticoagulation.     Procedure:   Left Barostim Insertion   Date of Surgery:  Clearance 03/17/23     CHA2DS2-VASc Score = 3   This indicates a 3.2% annual risk of stroke. The patient's score is based upon: CHF History: 1 HTN History: 0 Diabetes History: 0 Stroke History: 0 Vascular Disease History: 0 Age Score: 2 Gender Score: 0     CrCl 28 Platelet count 146   Per office protocol, patient can hold warfarin for 5 days prior to procedure.   Patient WILL need bridging with Lovenox (enoxaparin) around procedure.   Pt is self tester, monitored by [facility], they have been notified.   I will route this recommendation to the requesting party via Epic fax function and remove from pre-op pool.  Please call with questions.  Austin [name]. Austin Schaumburg NP-C     03/11/2023, 3:26 PM [facility] [address] Office [phone] Fax [phone]

## 2023-03-11 NOTE — Progress Notes (Signed)
PERIOPERATIVE PRESCRIPTION FOR IMPLANTED CARDIAC DEVICE PROGRAMMING  Patient Information: Name:  Austin [name]  DOB:  05/23/1941  MRN:  [id number]  Request for Surgical Clearance     Procedure:   Left Barostim Insertion   Date of Surgery:  Clearance 03/17/23                                 Surgeon:  [name] Surgeon's Group or Practice Name:  [facility] Phone number:  [phone] Fax number:  [phone]     Device Information:  Clinic EP Physician:  Lewayne Bunting, MD   Device Type:  Pacemaker and Defibrillator Manufacturer and Phone #:  Medtronic: [phone] Pacemaker Dependent?:  No. Date of Last Device Check:  12/22/22 Normal Device Function?:  Yes.    Electrophysiologist's Recommendations:  Have magnet available. Provide continuous ECG monitoring when magnet is used or reprogramming is to be performed.  Procedure will likely interfere. Device should be reprogrammed to tachy therapies disabled and asynchronous pacing   Per Device Clinic Standing Orders, Dorathy Daft, RN  2:36 PM 03/11/2023

## 2023-03-14 ENCOUNTER — Encounter: Payer: Self-pay | Admitting: Adult Health

## 2023-03-14 ENCOUNTER — Ambulatory Visit: Payer: Medicare Other | Admitting: Adult Health

## 2023-03-14 VITALS — BP 100/67 | HR 78 | Ht 73.0 in | Wt 195.0 lb

## 2023-03-14 DIAGNOSIS — G4733 Obstructive sleep apnea (adult) (pediatric): Secondary | ICD-10-CM

## 2023-03-15 ENCOUNTER — Telehealth: Payer: Self-pay

## 2023-03-15 NOTE — Telephone Encounter (Signed)
I spoke to the patient and shared Thayer Ohm' advisement to continue Lovenox injections as long as bleeding does not persist.  He will keep [place] updated and thanked [place] for the call.

## 2023-03-16 ENCOUNTER — Other Ambulatory Visit: Payer: Self-pay

## 2023-03-16 ENCOUNTER — Encounter (HOSPITAL_COMMUNITY): Payer: Self-pay | Admitting: Surgery

## 2023-03-16 NOTE — Progress Notes (Addendum)
PCP - Dr Sanda Linger Cardiologist - Dr Marca Ancona EP- Dr Lewayne Bunting Neurology - Dr Frances Furbish  Chest x-ray - 07/05/22 (2V) EKG - 02/03/23 Stress Test - n/a ECHO - 09/28/22 Cardiac Cath - 02/26/21  Left Barostim to be placed.  [employer] Rep Shari Prows was notified by Lucianne Lei, RN at MD's office and [employer] rep was also notified of date/time.  Last remote check was on 12/22/22.  See TE 03/11/23.    Sleep Study -  Yes CPAP - uses nightly  Diabetes - n/a  Blood Thinner Instructions:  Follow your surgeon's Coumadin-Lovenox instructions prior to surgery.  Patient verbalized understanding of blood thinner instructions.  NPO  Anesthesia review: Yes  STOP now taking any Aspirin (unless otherwise instructed by your surgeon), Aleve, Naproxen, Ibuprofen, Motrin, Advil, Goody's, BC's, all herbal medications, fish oil, and all vitamins.   Coronavirus Screening Do you have any of the following symptoms:  Cough yes/no: No Fever (>100.64F)  yes/no: No Runny nose yes/no: No Sore throat yes/no: No Difficulty breathing/shortness of breath  yes/no: No  Have you traveled in the last 14 days and where? yes/no: No  Patient verbalized understanding of instructions that were given via phone and also LMOM (Wife's cell [phone]) per patient's request .

## 2023-03-16 NOTE — Anesthesia Preprocedure Evaluation (Addendum)
Anesthesia Evaluation  Patient identified by MRN, date of birth, ID band Patient awake    Reviewed: Allergy & Precautions, NPO status , Patient's Chart, lab work & pertinent test results  History of Anesthesia Complications Negative for: history of anesthetic complications  Airway Mallampati: II  TM Distance: >3 FB Neck ROM: Full    Dental  (+) Missing,    Pulmonary sleep apnea and Continuous Positive Airway Pressure Ventilation    Pulmonary exam normal        Cardiovascular +CHF  Normal cardiovascular exam+ Cardiac Defibrillator   Echo 09/28/22: EF 20-25%, global hypokinesis, mild to moderate LVE, mildly reduced RV function, mild pHTN (RVSP ), moderate LAE, mild RAE, mechanical MV, moderate TR, 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  Reproductive/Obstetrics negative OB ROS                              Anesthesia Physical Anesthesia Plan  ASA: 4  Anesthesia Plan: General   Post-op Pain Management: Tylenol PO (pre-op)*   Induction: Intravenous  PONV Risk Score and Plan: 2 and Treatment may vary due to age or medical condition, Ondansetron, Dexamethasone and Midazolam  Airway Management Planned: Oral ETT  Additional Equipment: Arterial line  Intra-op Plan:   Post-operative Plan: Extubation in OR  Informed Consent: I have reviewed the patients History and Physical, chart, labs and discussed the procedure including the risks, benefits and alternatives for the proposed anesthesia with the patient or  authorized representative who has indicated his/her understanding and acceptance.     Dental advisory given  Plan Discussed with: CRNA  Anesthesia Plan Comments: (PAT note written 03/16/2023 by Shonna Chock, PA-C.  )        Anesthesia Quick Evaluation

## 2023-03-16 NOTE — Progress Notes (Signed)
Anesthesia Chart Review: Austin [name]  Case: [id number] Date/Time: 03/17/23 4782   Procedure: INSERTION OF [name] (Left)   Anesthesia type: General   Pre-op diagnosis: Congestive heart failure   [facility]: [facility] ROOM 16 / [facility]   Surgeons: Nada Libman, MD       DISCUSSION: Patient is an 82 year old male scheduled for the above procedure.  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  Last visit with HF cardiologist Dr. Shirlee Latch 02/03/23. Lovenox bridge planned while off warfarin for Barostim. In regards to ICD, he noted, "The patient has CHB and has been chronically RV pacing as his LV lead is nonfunctional (when not in VT). This is not ideal and creates dyssynchrony. Remains symptomatic with limited ability to titrate meds. He has seen Dr. Ladona Ridgel, it would be possible to place left bundle lead but procedure would be complicated. After further discussion with Dr. Ladona Ridgel, it was decided that risk would be too high and left bundle lead will not be attempted."  EP ICD Perioperative Recommendations: Device Information: Clinic EP Physician:  Lewayne Bunting, MD    Device Type:  Pacemaker and Defibrillator Manufacturer and Phone #:  [employer]: [id number] Pacemaker Dependent?:  No. Date of Last Device Check:  12/22/22           Normal Device Function?:  Yes.     Electrophysiologist's Recommendations: Have magnet available. Provide continuous ECG monitoring when magnet is used or reprogramming is to be performed.  Procedure  will likely interfere. Device should be reprogrammed to tachy therapies disabled and asynchronous pacing    Anesthesia team to evaluate on the day of surgery. Creatinine 2.57 on 02/03/23 which is consistent with prior results, Cr > 2 since at least 07/2022 with Cr 2.33-2.74 since 12/08/22.    VS:  BP Readings from Last 3 Encounters:  03/14/23 100/67  02/03/23 110/70  01/03/23 103/67   Pulse Readings from Last 3 Encounters:  03/14/23 78  02/03/23 71  01/03/23 68     PROVIDERS: [name], MD is PCP  Marca Ancona, MD is HF cardiologist Lewayne Bunting, MD is EP cardiologist Huston Foley, MD is neurologist ([facility])   LABS: Most recent lab results in [facility] include: Lab Results  Component Value Date   WBC 7.8 02/03/2023   HGB 12.2 (L) 02/03/2023   HCT 37.5 (L) 02/03/2023   PLT 146 (L) 02/03/2023   GLUCOSE 100 (H) 02/03/2023   ALT 26 02/03/2023   AST 31 02/03/2023   NA 133 (L) 02/03/2023   K 4.7 02/03/2023   CL 95 (L) 02/03/2023   CREATININE 2.57 (H) 02/03/2023   BUN 50 (H) 02/03/2023   CO2 28 02/03/2023   TSH 6.129 (H) 02/03/2023   INR 3.6 (A) 03/07/2023     Home Sleep Study 12/15/22: IMPRESSION: Primary Central Sleep Apnea OSA (obstructive sleep apnea) Nocturnal Hypoxemia RECOMMENDATION:  This home sleep test demonstrates severe sleep disordered breathing with a primary central component and obstructive sleep apnea with a total AHI of 53.3/hour, central apnea index of 31.9/hour and O2 nadir of 68% with significant time below or  at 88% saturation of over 180 minutes indicating nocturnal hypoxemia. His average oxygen saturation was only 88%. Moderate to loud snoring was detected consistently throughout the study. Ongoing treatment with positive airway pressure is highly recommended. The patient will be advised to return for a full night PAP titration study for proper treatment settings, O2 monitoring and mask fitting. He may need BiPAP therapy or BiPAP ST. For now, the patient  will be advised to continue with his current autoPAP machine....    IMAGES: CXR 07/05/22: IMPRESSION: 1. Evidence of prior median and artificial mitral valve placement. 2. Very mild left basilar atelectasis.    EKG: 02/03/23: AV dual-paced rhythm with occasional ventricular-paced complexes with ventricular escape complexes Abnormal ECG When compared with ECG of 08-Dec-2022 15:31, Premature ventricular complexes are no longer Present Sinus rhythm is now with ventricular escape complexes Vent. rate has decreased BY 4 BPM Confirmed by Orpah Cobb [phone]) on 02/03/2023 3:21:39 PM   CV: US Carotid 01/03/23: Summary:  Right Carotid: Velocities in the right ICA are consistent with a 1-39%  stenosis.  Left Carotid: Velocities in the left ICA are consistent with a 1-39%  stenosis.  Vertebrals: Bilateral vertebral arteries demonstrate antegrade flow.  Subclavians: Normal flow hemodynamics were seen in bilateral subclavian               arteries.   Echo 09/28/22: IMPRESSIONS   1. Left ventricular ejection fraction, by estimation, is 20 to 25%. The  left ventricle has severely decreased function. The left ventricle  demonstrates global hypokinesis. The left ventricular internal cavity size  was mildly to moderately dilated. Left  ventricular diastolic parameters are indeterminate. No LV thrombus.   2. Right ventricular systolic function is mildly reduced. The right  ventricular size is normal. There is mildly elevated pulmonary artery  systolic pressure. The estimated right ventricular systolic pressure is  42.0 mmHg.   3. Left atrial size was moderately dilated.   4. Right atrial size was mildly dilated.   5. Mechanical mitral valve with mean gradient 4, no significant mitral  regurgitation noted. Function appears normal.   6. The tricuspid valve is abnormal. Tricuspid valve regurgitation is  moderate.   7. The aortic valve is tricuspid. There is mild calcification of the  aortic  valve. Aortic valve regurgitation is not visualized. No aortic  stenosis is present.   8. Aortic dilatation noted. There is mild dilatation of the aortic root,  measuring 39 mm.   9. The inferior vena cava is dilated in size with <50% respiratory  variability, suggesting right atrial pressure of 15 mmHg.   RHC 02/26/21: Hemodynamics (mmHg) RA mean 4 RV 33/4 PA 32/12, mean 21 PCWP mean 11  Oxygen saturations: PA 71% AO 98%  Cardiac Output (Fick) 4.66  Cardiac Index (Fick) 2.18  1. Normal filling pressures.  2. Low but not markedly low cardiac output.    I will keep him on lower Lasix dose, 20 mg every other day.  He will continue digoxin for now, will check level today.  He decreased amiodarone a couple of days ago.  Still feels poorly => fatigued/tired.  Would give him some time on lower amiodarone dose, if continues to feel poorly, would consider CRT upgrade given chronic RV pacing.  He says that he has an appointment with Dr. Ladona Ridgel in the next month or so.     Past Medical History:  Diagnosis Date   AICD (automatic cardioverter/defibrillator) present    Atrial fibrillation (HCC)  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    cx by infection, explantation11/12 & reimplant 1/13   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   Conductive hearing loss    Intraspinal abscess    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   Nonischemic cardiomyopathy (HCC)    Psychosexual dysfunction with inhibited sexual excitement    S/P mitral valve replacement    Syncope and collapse    Unspecified sleep apnea    last sleep study 11/07   Ventricular tachycardia (HCC)    VT (ventricular tachycardia) (HCC)     Past Surgical History:  Procedure Laterality Date   BIV ICD GENERATOR CHANGEOUT N/A 05/04/2017   Procedure: BiV ICD Generator Changeout;  Surgeon: Marinus Maw, MD;  Location: [facility] INVASIVE CV LAB;  Service: Cardiovascular;   Laterality: N/A;   CARDIAC CATHETERIZATION  04/24/2002   CARDIAC VALVE REPLACEMENT     CARDIOVERSION  03/09/2012   Procedure: CARDIOVERSION;  Surgeon: Marinus Maw, MD;  Location: [facility] ENDOSCOPY;  Service: Cardiovascular;  Laterality: N/A;   CARDIOVERSION N/A 11/22/2013   Procedure: CARDIOVERSION;  Surgeon: Thurmon Fair, MD;  Location: [facility] ENDOSCOPY;  Service: Cardiovascular;  Laterality: N/A;   dental implants     ELECTROPHYSIOLOGIC STUDY N/A 09/15/2015   Procedure: Floyce Stakes Ablation;  Surgeon: Marinus Maw, MD;  Location: [facility] INVASIVE CV LAB;  Service: Cardiovascular;  Laterality: N/A;   Evacution of epidural lumbar epidural abscess  1999   INSERT / REPLACE / REMOVE PACEMAKER  11/2008   MITRAL VALVE REPLACEMENT     w #[id number] [facility]   PERMANENT PACEMAKER INSERTION N/A 08/05/2011   Procedure: PERMANENT PACEMAKER INSERTION;  Surgeon: Marinus Maw, MD;  Location: [facility] CATH LAB;  Service: Cardiovascular;  Laterality: N/A;   RIGHT HEART CATH N/A 02/26/2021   Procedure: RIGHT HEART CATH;  Surgeon: Laurey Morale, MD;  Location: [facility] INVASIVE CV LAB;  Service: Cardiovascular;  Laterality: N/A;   THYROIDECTOMY     TONSILLECTOMY     VALVE REPLACEMENT  2000    MEDICATIONS: No current facility-administered medications for this encounter.    acetaminophen (TYLENOL) 500 MG tablet   amiodarone (PACERONE) 200 MG tablet   furosemide (LASIX) 20 MG tablet   levothyroxine (SYNTHROID) 88 MCG tablet   melatonin 5 MG TABS   metoprolol succinate (TOPROL-XL) 25 MG 24 hr tablet   Multiple Vitamin (MULTIVITAMIN WITH MINERALS) TABS tablet   nitroGLYCERIN (NITROSTAT) 0.4 MG SL tablet   quiniDINE gluconate 324 MG CR tablet   spironolactone (ALDACTONE) 25 MG tablet   valsartan (DIOVAN) 40 MG tablet   warfarin (COUMADIN) 5 MG tablet   zinc gluconate 50 MG tablet   amoxicillin (AMOXIL) 500 MG capsule   enoxaparin (LOVENOX) 80 MG/0.8ML injection   mupirocin ointment (BACTROBAN) 2 %    Shonna Chock,  PA-C Surgical Short Stay/Anesthesiology [facility] Phone [phone] [facility] Phone [phone] 03/16/2023 2:08 PM

## 2023-03-17 ENCOUNTER — Encounter (HOSPITAL_COMMUNITY): Payer: Self-pay | Admitting: Surgery

## 2023-03-17 ENCOUNTER — Other Ambulatory Visit: Payer: Self-pay

## 2023-03-17 ENCOUNTER — Observation Stay (HOSPITAL_COMMUNITY)
Admission: RE | Admit: 2023-03-17 | Discharge: 2023-03-17 | Disposition: A | Discharge status: Home / Self Care | Payer: Medicare Other | Attending: Surgery | Admitting: Surgery

## 2023-03-17 ENCOUNTER — Encounter (HOSPITAL_COMMUNITY)
Admission: RE | Disposition: A | Discharge status: Home / Self Care | Payer: Self-pay | Source: Home / Self Care | Attending: Surgery

## 2023-03-17 ENCOUNTER — Other Ambulatory Visit: Payer: Self-pay | Admitting: Physician Assistant

## 2023-03-17 ENCOUNTER — Inpatient Hospital Stay (HOSPITAL_BASED_OUTPATIENT_CLINIC_OR_DEPARTMENT_OTHER): Payer: Medicare Other | Admitting: Physician Assistant

## 2023-03-17 ENCOUNTER — Inpatient Hospital Stay (HOSPITAL_COMMUNITY): Payer: Medicare Other | Admitting: Physician Assistant

## 2023-03-17 DIAGNOSIS — I5022 Chronic systolic (congestive) heart failure: Secondary | ICD-10-CM

## 2023-03-17 DIAGNOSIS — I082 Rheumatic disorders of both aortic and tricuspid valves: Secondary | ICD-10-CM | POA: Diagnosis not present

## 2023-03-17 DIAGNOSIS — E039 Hypothyroidism, unspecified: Secondary | ICD-10-CM | POA: Diagnosis not present

## 2023-03-17 DIAGNOSIS — I4891 Unspecified atrial fibrillation: Secondary | ICD-10-CM | POA: Diagnosis not present

## 2023-03-17 DIAGNOSIS — Z952 Presence of prosthetic heart valve: Secondary | ICD-10-CM | POA: Insufficient documentation

## 2023-03-17 DIAGNOSIS — I428 Other cardiomyopathies: Secondary | ICD-10-CM | POA: Diagnosis not present

## 2023-03-17 DIAGNOSIS — I509 Heart failure, unspecified: Secondary | ICD-10-CM

## 2023-03-17 DIAGNOSIS — I442 Atrioventricular block, complete: Secondary | ICD-10-CM | POA: Diagnosis not present

## 2023-03-17 DIAGNOSIS — I5042 Chronic combined systolic (congestive) and diastolic (congestive) heart failure: Secondary | ICD-10-CM

## 2023-03-17 DIAGNOSIS — G4733 Obstructive sleep apnea (adult) (pediatric): Secondary | ICD-10-CM | POA: Insufficient documentation

## 2023-03-17 DIAGNOSIS — Z79890 Hormone replacement therapy (postmenopausal): Secondary | ICD-10-CM | POA: Diagnosis not present

## 2023-03-17 DIAGNOSIS — Z79899 Other long term (current) drug therapy: Secondary | ICD-10-CM | POA: Diagnosis not present

## 2023-03-17 DIAGNOSIS — N1832 Chronic kidney disease, stage 3b: Secondary | ICD-10-CM | POA: Insufficient documentation

## 2023-03-17 DIAGNOSIS — Z7901 Long term (current) use of anticoagulants: Secondary | ICD-10-CM | POA: Diagnosis not present

## 2023-03-17 DIAGNOSIS — Z95810 Presence of automatic (implantable) cardiac defibrillator: Secondary | ICD-10-CM | POA: Diagnosis not present

## 2023-03-17 DIAGNOSIS — I5089 Other heart failure: Principal | ICD-10-CM | POA: Insufficient documentation

## 2023-03-17 HISTORY — DX: Heart failure, unspecified: I50.9

## 2023-03-17 HISTORY — DX: Hypothyroidism, unspecified: E03.9

## 2023-03-17 LAB — APTT: aPTT: 26 seconds (ref 24–36)

## 2023-03-17 LAB — COMPREHENSIVE METABOLIC PANEL
ALT: 28 U/L (ref 0–44)
AST: 38 U/L (ref 15–41)
Albumin: 3.7 g/dL (ref 3.5–5.0)
Alkaline Phosphatase: 79 U/L (ref 38–126)
Anion gap: 10 (ref 5–15)
BUN: 46 mg/dL — ABNORMAL HIGH (ref 8–23)
CO2: 24 mmol/L (ref 22–32)
Calcium: 8.5 mg/dL — ABNORMAL LOW (ref 8.9–10.3)
Chloride: 101 mmol/L (ref 98–111)
Creatinine, Ser: 2.49 mg/dL — ABNORMAL HIGH (ref 0.61–1.24)
GFR, Estimated: 25 mL/min — ABNORMAL LOW (ref >60)
Glucose, Bld: 91 mg/dL (ref 70–99)
Potassium: 3.6 mmol/L (ref 3.5–5.1)
Sodium: 135 mmol/L (ref 135–145)
Total Bilirubin: 1 mg/dL (ref 0.3–1.2)
Total Protein: 6.9 g/dL (ref 6.5–8.1)

## 2023-03-17 LAB — URINALYSIS, ROUTINE W REFLEX MICROSCOPIC
Bilirubin Urine: NEGATIVE
Glucose, UA: NEGATIVE mg/dL
Hgb urine dipstick: NEGATIVE
Ketones, ur: NEGATIVE mg/dL
Leukocytes,Ua: NEGATIVE
Nitrite: NEGATIVE
Protein, ur: NEGATIVE mg/dL
Specific Gravity, Urine: 1.013 (ref 1.005–1.030)
pH: 5 (ref 5.0–8.0)

## 2023-03-17 LAB — PROTIME-INR
INR: 1.4 — ABNORMAL HIGH (ref 0.8–1.2)
Prothrombin Time: 17.3 seconds — ABNORMAL HIGH (ref 11.4–15.2)

## 2023-03-17 LAB — CBC
HCT: 36.1 % — ABNORMAL LOW (ref 39.0–52.0)
Hemoglobin: 11.5 g/dL — ABNORMAL LOW (ref 13.0–17.0)
MCH: 30.7 pg (ref 26.0–34.0)
MCHC: 31.9 g/dL (ref 30.0–36.0)
MCV: 96.5 fL (ref 80.0–100.0)
Platelets: 117 10*3/uL — ABNORMAL LOW (ref 150–400)
RBC: 3.74 MIL/uL — ABNORMAL LOW (ref 4.22–5.81)
RDW: 16 % — ABNORMAL HIGH (ref 11.5–15.5)
WBC: 6.4 10*3/uL (ref 4.0–10.5)
nRBC: 0 % (ref 0.0–0.2)

## 2023-03-17 LAB — TYPE AND SCREEN
ABO/RH(D): A POS
Antibody Screen: NEGATIVE

## 2023-03-17 SURGERY — INSERTION, CAROTID SINUS BAROREFLEX ACTIVATION DEVICE
Anesthesia: General | Site: Neck | Laterality: Left

## 2023-03-17 MED ORDER — SODIUM CHLORIDE 0.9 % IV SOLN
INTRAVENOUS | Status: DC | PRN
  Administered 2023-03-17: 500 mL

## 2023-03-17 MED ORDER — OXYCODONE HCL 5 MG/5ML PO SOLN: 5.0000 mg | Freq: Once | ORAL | Status: DC | PRN

## 2023-03-17 MED ORDER — CEFAZOLIN SODIUM-DEXTROSE 2-4 GM/100ML-% IV SOLN
2.0000 g | INTRAVENOUS | Status: AC
  Administered 2023-03-17: 2 g via INTRAVENOUS

## 2023-03-17 MED ORDER — CHLORHEXIDINE GLUCONATE 0.12 % MT SOLN: 15.0000 mL | Freq: Once | OROMUCOSAL | Status: AC

## 2023-03-17 MED ORDER — CHLORHEXIDINE GLUCONATE CLOTH 2 % EX PADS: 6.0000 | MEDICATED_PAD | Freq: Once | CUTANEOUS | Status: DC

## 2023-03-17 MED ORDER — ONDANSETRON HCL 4 MG/2ML IJ SOLN
INTRAMUSCULAR | Status: DC | PRN
  Administered 2023-03-17: 4 mg via INTRAVENOUS

## 2023-03-17 MED ORDER — ETOMIDATE 2 MG/ML IV SOLN
INTRAVENOUS | Status: DC | PRN
  Administered 2023-03-17: 6 mg via INTRAVENOUS
  Administered 2023-03-17: 10 mg via INTRAVENOUS

## 2023-03-17 MED ORDER — MIDAZOLAM HCL 2 MG/2ML IJ SOLN
INTRAMUSCULAR | Status: AC
  Filled 2023-03-17: qty 2

## 2023-03-17 MED ORDER — LACTATED RINGERS IV SOLN: INTRAVENOUS | Status: DC

## 2023-03-17 MED ORDER — MIDAZOLAM HCL 2 MG/2ML IJ SOLN
INTRAMUSCULAR | Status: DC | PRN
Start: 2023-03-17 — End: 2023-03-17
  Administered 2023-03-17: 2 mg via INTRAVENOUS

## 2023-03-17 MED ORDER — ROCURONIUM BROMIDE 10 MG/ML (PF) SYRINGE
PREFILLED_SYRINGE | INTRAVENOUS | Status: AC
  Filled 2023-03-17: qty 10

## 2023-03-17 MED ORDER — OXYCODONE HCL 5 MG PO TABS: 5.0000 mg | ORAL_TABLET | Freq: Once | ORAL | Status: DC | PRN

## 2023-03-17 MED ORDER — FENTANYL CITRATE (PF) 250 MCG/5ML IJ SOLN
INTRAMUSCULAR | Status: DC | PRN
  Administered 2023-03-17: 25 ug via INTRAVENOUS
  Administered 2023-03-17: 100 ug via INTRAVENOUS
  Administered 2023-03-17: 25 ug via INTRAVENOUS

## 2023-03-17 MED ORDER — SUGAMMADEX SODIUM 200 MG/2ML IV SOLN
INTRAVENOUS | Status: DC | PRN
  Administered 2023-03-17: 177 mg via INTRAVENOUS

## 2023-03-17 MED ORDER — FENTANYL CITRATE (PF) 250 MCG/5ML IJ SOLN
INTRAMUSCULAR | Status: AC
  Filled 2023-03-17: qty 5

## 2023-03-17 MED ORDER — 0.9 % SODIUM CHLORIDE (POUR BTL) OPTIME
TOPICAL | Status: DC | PRN
  Administered 2023-03-17: 1000 mL

## 2023-03-17 MED ORDER — PROPOFOL 10 MG/ML IV BOLUS
INTRAVENOUS | Status: DC | PRN
  Administered 2023-03-17: 20 mg via INTRAVENOUS
  Administered 2023-03-17: 50 mg via INTRAVENOUS

## 2023-03-17 MED ORDER — CHLORHEXIDINE GLUCONATE 0.12 % MT SOLN
OROMUCOSAL | Status: AC
  Administered 2023-03-17: 15 mL via OROMUCOSAL
  Filled 2023-03-17: qty 15

## 2023-03-17 MED ORDER — ONDANSETRON HCL 4 MG/2ML IJ SOLN
INTRAMUSCULAR | Status: AC
  Filled 2023-03-17: qty 2

## 2023-03-17 MED ORDER — SODIUM CHLORIDE 0.9 % IV SOLN: INTRAVENOUS | Status: DC | PRN

## 2023-03-17 MED ORDER — LIDOCAINE 2% (20 MG/ML) 5 ML SYRINGE
INTRAMUSCULAR | Status: AC
  Filled 2023-03-17: qty 5

## 2023-03-17 MED ORDER — SODIUM CHLORIDE 0.9 % IV SOLN: INTRAVENOUS | Status: DC

## 2023-03-17 MED ORDER — FENTANYL CITRATE (PF) 100 MCG/2ML IJ SOLN: 25.0000 ug | INTRAMUSCULAR | Status: DC | PRN

## 2023-03-17 MED ORDER — LIDOCAINE 2% (20 MG/ML) 5 ML SYRINGE
INTRAMUSCULAR | Status: DC | PRN
  Administered 2023-03-17: 100 mg via INTRAVENOUS

## 2023-03-17 MED ORDER — ORAL CARE MOUTH RINSE: 15.0000 mL | Freq: Once | OROMUCOSAL | Status: AC

## 2023-03-17 MED ORDER — PHENYLEPHRINE HCL-NACL 20-0.9 MG/250ML-% IV SOLN
INTRAVENOUS | Status: DC | PRN
  Administered 2023-03-17: 50 ug/min via INTRAVENOUS

## 2023-03-17 MED ORDER — CEFAZOLIN SODIUM-DEXTROSE 2-4 GM/100ML-% IV SOLN
INTRAVENOUS | Status: AC
  Filled 2023-03-17: qty 100

## 2023-03-17 MED ORDER — PROPOFOL 10 MG/ML IV BOLUS
INTRAVENOUS | Status: AC
  Filled 2023-03-17: qty 20

## 2023-03-17 MED ORDER — ETOMIDATE 2 MG/ML IV SOLN
INTRAVENOUS | Status: AC
  Filled 2023-03-17: qty 10

## 2023-03-17 MED ORDER — HYDROCODONE-ACETAMINOPHEN 5-325 MG PO TABS: 1.0000 | ORAL_TABLET | Freq: Four times a day (QID) | ORAL | 0 refills | Status: DC | PRN

## 2023-03-17 MED ORDER — ROCURONIUM BROMIDE 10 MG/ML (PF) SYRINGE
PREFILLED_SYRINGE | INTRAVENOUS | Status: DC | PRN
  Administered 2023-03-17: 60 mg via INTRAVENOUS

## 2023-03-17 MED ORDER — SODIUM CHLORIDE 0.9 % IV SOLN
0.1500 ug/kg/min | Freq: Once | INTRAVENOUS | Status: DC
  Filled 2023-03-17: qty 2000

## 2023-03-17 SURGICAL SUPPLY — 39 items
ADH SKN CLS APL DERMABOND .7 (GAUZE/BANDAGES/DRESSINGS) ×1
APL PRP STRL LF DISP 70% ISPRP (MISCELLANEOUS) ×1
BAG COUNTER SPONGE SURGICOUNT (BAG) ×1 IMPLANT
BAG SPNG CNTER NS LX DISP (BAG) ×1
CANISTER SUCT 3000ML PPV (MISCELLANEOUS) ×1 IMPLANT
CHLORAPREP W/TINT 26 (MISCELLANEOUS) ×1 IMPLANT
CLIP TI MEDIUM 6 (CLIP) IMPLANT
CLIP TI WIDE RED SMALL 6 (CLIP) IMPLANT
COVER PROBE W GEL 5X96 (DRAPES) ×1 IMPLANT
DERMABOND ADVANCED .7 DNX12 (GAUZE/BANDAGES/DRESSINGS) ×1 IMPLANT
ELECT REM PT RETURN 9FT ADLT (ELECTROSURGICAL) ×1
ELECTRODE REM PT RTRN 9FT ADLT (ELECTROSURGICAL) ×1 IMPLANT
GLOVE SURG SS PI 7.5 STRL IVOR (GLOVE) ×1 IMPLANT
GOWN STRL REUS W/ TWL LRG LVL3 (GOWN DISPOSABLE) ×2 IMPLANT
GOWN STRL REUS W/ TWL XL LVL3 (GOWN DISPOSABLE) ×1 IMPLANT
GOWN STRL REUS W/TWL LRG LVL3 (GOWN DISPOSABLE) ×2
GOWN STRL REUS W/TWL XL LVL3 (GOWN DISPOSABLE) ×1
HEMOSTAT SNOW SURGICEL 2X4 (HEMOSTASIS) IMPLANT
KIT BASIN OR (CUSTOM PROCEDURE TRAY) ×1 IMPLANT
KIT TURNOVER KIT B (KITS) ×1 IMPLANT
MARKER SKIN DUAL TIP RULER LAB (MISCELLANEOUS) ×1 IMPLANT
NDL 18GX1X1/2 (RX/OR ONLY) (NEEDLE) ×1 IMPLANT
NEEDLE 18GX1X1/2 (RX/OR ONLY) (NEEDLE) ×1 IMPLANT
NS IRRIG 1000ML POUR BTL (IV SOLUTION) ×2 IMPLANT
PACK CAROTID (CUSTOM PROCEDURE TRAY) ×1 IMPLANT
PAD ARMBOARD 7.5X6 YLW CONV (MISCELLANEOUS) ×2 IMPLANT
POSITIONER HEAD DONUT 9IN (MISCELLANEOUS) ×1 IMPLANT
SUT ETHIBOND CT1 BRD #0 30IN (SUTURE) ×2 IMPLANT
SUT ETHILON 3 0 PS 1 (SUTURE) IMPLANT
SUT PROLENE 6 0 BV (SUTURE) ×8 IMPLANT
SUT SILK 0 FSL (SUTURE) IMPLANT
SUT VIC AB 3-0 SH 27 (SUTURE) ×2
SUT VIC AB 3-0 SH 27X BRD (SUTURE) ×2 IMPLANT
SUT VICRYL 4-0 PS2 18IN ABS (SUTURE) ×2 IMPLANT
SYR 5ML LL (SYRINGE) ×1 IMPLANT
SYR BULB IRRIG 60ML STRL (SYRINGE) ×1 IMPLANT
SYSTEM IPG BAROSTIM 2104 (Generator) IMPLANT
TOWEL GREEN STERILE (TOWEL DISPOSABLE) ×1 IMPLANT
WATER STERILE IRR 1000ML POUR (IV SOLUTION) ×1 IMPLANT

## 2023-03-17 NOTE — Anesthesia Postprocedure Evaluation (Signed)
Anesthesia Post Note  Patient: Austin [name]  Procedure(s) Performed: INSERTION OF LEFT BAROSTIM (Left: Neck)     Patient location during evaluation: PACU Anesthesia Type: General Level of consciousness: awake and alert Pain management: pain level controlled Vital Signs Assessment: post-procedure vital signs reviewed and stable Respiratory status: spontaneous breathing, nonlabored ventilation and respiratory function stable Cardiovascular status: blood pressure returned to baseline Postop Assessment: no apparent nausea or vomiting Anesthetic complications: no   No notable events documented.  Last Vitals:  Vitals:   03/17/23 1300 03/17/23 1330  BP: 103/68 103/67  Pulse: 61 61  Resp: 18 (!) 23  Temp:    SpO2: 97% 99%    Last Pain:  Vitals:   03/17/23 1204  TempSrc:   PainSc: 0-No pain                 Shanda Howells

## 2023-03-17 NOTE — H&P (Signed)
[facility]ist [facility]   Patient name: Austin [name]         MRN: [id number]        DOB: 1940-10-26            Sex: male     REQUESTING PROVIDER:     Dr. Shirlee Latch     REASON FOR CONSULT:    Barostim evaluation   HISTORY OF PRESENT ILLNESS:    Austin [name] is a 82 y.o. male, who is referred for Barostim evaluation.  Patient suffers from NYHA class III symptoms.  He has suspected nonischemic cardiomyopathy.  Echocardiogram and March 2024 shows an ejection fraction of 20-25%.  He has a biventricular ICD.  He has complete heart block.  He is on goal-directed medical therapy.  He still has issues with fatigue and shortness of breath.  His biggest complaint is lack of energy which is getting worse he is on Coumadin for atrial fibrillation.   About a decade ago, he had a device removed from his left chest secondary to infection.  His ICD is on the right.  He also has been treated for a right fifth toe wound that has essentially healed.  He has no infectious issues currently   PAST MEDICAL HISTORY          Past Medical History:  Diagnosis Date   AICD (automatic cardioverter/defibrillator) present     Atrial fibrillation (HCC)     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      cx by infection, explantation11/12 & reimplant 1/13   Conductive hearing loss     Intraspinal abscess     Mitral valve insufficiency and aortic valve insufficiency      s/p MVR mechanical   Nonischemic cardiomyopathy (HCC)     Psychosexual dysfunction with inhibited sexual excitement     S/P mitral valve replacement     Syncope and collapse     Unspecified sleep apnea      last sleep study 11/07   Ventricular tachycardia (HCC)     VT (ventricular tachycardia) (HCC)              FAMILY HISTORY         Family History  Problem Relation Age of Onset   Heart disease Mother     Heart failure Mother     Heart disease Father     Heart failure Father      Sleep apnea Neg Hx            SOCIAL HISTORY:    Social History         Socioeconomic History   Marital status: Married      Spouse name: Not on file   Number of children: 1   Years of education: 18   Highest education level: Master's degree (e.g., MA, MS, MEng, MEd, MSW, MBA)  Occupational History   Occupation: retired from [employer]  Tobacco Use   Smoking status: Never   Smokeless tobacco: Never  Vaping Use   Vaping Use: Never used  Substance and Sexual Activity   Alcohol use: Yes      Alcohol/week: 0.0 standard drinks of alcohol      Comment: occasionally   Drug use: No   Sexual activity: Not on file  Other Topics Concern   Not on file  Social History Narrative    Lives with wife in a one story home.  Has one daughter.  Retired.  Education:  Masters.    Social Determinants of Health    Financial Resource Strain: Not on file  Food Insecurity: Not on file  Transportation Needs: Not on file  Physical Activity: Not on file  Stress: Not on file  Social Connections: Not on file  Intimate Partner Violence: Not on file      ALLERGIES:      Allergies  No Known Allergies     CURRENT MEDICATIONS:            Current Outpatient Medications  Medication Sig Dispense Refill   acetaminophen (TYLENOL) 500 MG tablet Take 1,000 mg by mouth every 6 (six) hours as needed (pain).       amiodarone (PACERONE) 200 MG tablet Take 1 tablet (200 mg total) by mouth daily. Do NOT take Amiodarone on Wednesday or Sunday. 90 tablet 2   amoxicillin (AMOXIL) 500 MG capsule TAKE 4 CAPSULES BY MOUTH 30 MINUTES PRIOR TO DENTAL WORK 4 capsule 2   cefadroxil (DURICEF) 500 MG capsule Take 1 capsule (500 mg total) by mouth 2 (two) times daily. 20 capsule 0   furosemide (LASIX) 20 MG tablet Take 3 tablets (60 mg total) by mouth 2 (two) times daily. 180 tablet 8   levothyroxine (SYNTHROID) 88 MCG tablet TAKE 1 TABLET BY MOUTH EVERY DAY 90 tablet 0   melatonin 5 MG TABS 5 mg at bedtime  as needed.       metoprolol succinate (TOPROL-XL) 25 MG 24 hr tablet Take 1 tablet (25 mg total) by mouth daily. 90 tablet 3   Multiple Vitamin (MULTIVITAMIN WITH MINERALS) TABS tablet Take 1 tablet by mouth in the morning.       nitroGLYCERIN (NITROSTAT) 0.4 MG SL tablet Place 1 tablet (0.4 mg total) under the tongue every 5 (five) minutes x 3 doses as needed for chest pain. 25 tablet 2   quiniDINE gluconate 324 MG CR tablet Take 1 tablet (324 mg total) by mouth 2 (two) times daily. 60 tablet 11   spironolactone (ALDACTONE) 25 MG tablet TAKE 1/2 TABLET BY MOUTH EVERY DAY 45 tablet 3   valsartan (DIOVAN) 40 MG tablet Take 20 mg by mouth daily.       warfarin (COUMADIN) 5 MG tablet TAKE AS DIRECTED BY THE COUMADIN CLINIC 80 tablet 1   zinc gluconate 50 MG tablet Take 1 tablet (50 mg total) by mouth daily. 90 tablet 1      No current facility-administered medications for this visit.        REVIEW OF SYSTEMS:    [X]  denotes positive finding, [ ]  denotes negative finding Cardiac   Comments:  Chest pain or chest pressure:      Shortness of breath upon exertion: x    Short of breath when lying flat:      Irregular heart rhythm:             Vascular      Pain in calf, thigh, or hip brought on by ambulation:      Pain in feet at night that wakes you up from your sleep:       Blood clot in your veins:      Leg swelling:              Pulmonary      Oxygen at home:      Productive cough:       Wheezing:              Neurologic  Sudden weakness in arms or legs:       Sudden numbness in arms or legs:       Sudden onset of difficulty speaking or slurred speech:      Temporary loss of vision in one eye:       Problems with dizziness:              Gastrointestinal      Blood in stool:         Vomited blood:              Genitourinary      Burning when urinating:       Blood in urine:             Psychiatric      Major depression:              Hematologic      Bleeding  problems:      Problems with blood clotting too easily:             Skin      Rashes or ulcers:             Constitutional      Fever or chills:        PHYSICAL EXAM:    There were no vitals filed for this visit.   GENERAL: The patient is a well-nourished male, in no acute distress. The vital signs are documented above. CARDIAC: There is a regular rate and rhythm.  VASCULAR: SonoSite was used to evaluate the right carotid bifurcation which is in the mid neck PULMONARY: Nonlabored respirations ABDOMEN: Soft and non-tender with normal pitched bowel sounds.  MUSCULOSKELETAL: There are no major deformities or cyanosis. NEUROLOGIC: No focal weakness or paresthesias are detected. SKIN: There are no ulcers or rashes noted. PSYCHIATRIC: The patient has a normal affect.   STUDIES:    I have reviewed the following carotid Doppler study: 1-39% bilaterally   ASSESSMENT and PLAN    NYHA class III: I think the patient would be a good candidate for Barostim therapy.  He has had a prior device removed from his left chest secondary to infection.  Therefore I discussed placing his device behind the muscle.  We talked about the risks of infection.  We talked about the details of the procedure.  He is eager to do this given his current symptoms despite goal-directed medical therapy.  He is on Coumadin for his mitral valve.  I will ask cardiology to make recommendations regarding stopping this.  I also discussed the possibility of keeping him overnight if there are issues with bleeding during surgery.     Charlena Cross, MD, FACS [facility] [phone] Pager [phone]

## 2023-03-17 NOTE — Care Management Obs Status (Signed)
MEDICARE OBSERVATION STATUS NOTIFICATION   Patient Details  Name: Austin [name] MRN: [id number] Date of Birth: [birth year]   Medicare Observation Status Notification Given:  Yes    Oletta Cohn, RN 03/17/2023, 2:11 PM

## 2023-03-17 NOTE — Transfer of Care (Signed)
Immediate Anesthesia Transfer of Care Note  Patient: Austin [name]  Procedure(s) Performed: INSERTION OF LEFT BAROSTIM (Left: Neck)  Patient Location: PACU  Anesthesia Type:General  Level of Consciousness: awake, drowsy, patient cooperative, and responds to stimulation  Airway & Oxygen Therapy: Patient Spontanous Breathing and Patient connected to face mask oxygen  Post-op Assessment: Report given to RN, Post -op Vital signs reviewed and stable, Patient moving all extremities X 4, and Patient able to stick tongue midline  Post vital signs: Reviewed and stable  Last Vitals:  Vitals Value Taken Time  BP 112/77 03/17/23 1204  Temp 97.8   Pulse 66 03/17/23 1206  Resp 10 03/17/23 1206  SpO2 99 % 03/17/23 1206  Vitals shown include unfiled device data.  Last Pain:  Vitals:   03/17/23 0804  TempSrc:   PainSc: 0-No pain         Complications: No notable events documented.

## 2023-03-17 NOTE — Op Note (Signed)
    Patient name: Austin [name] MRN: [id number] DOB: 01/11/1941 Sex: male  03/17/2023 Pre-operative Diagnosis: NYHA class III heart failure Post-operative diagnosis:  Same Surgeon:  Durene Cal Assistants:  Aggie Moats, PA Procedure:   Insertion of left sided Barostim Anesthesia:  General Blood Loss:  Minimal Specimens:  none  Findings: IPG was placed behind the pectoralis muscle.  Devices at position "A" Device serial number: [id number], model 2104 Lead serial [id number], model 1036  Indications: This is an 82 year old gentleman with NYHA class III heart failure on goal-directed medical therapy with persistent symptoms.  He comes in today for Barostim implant.  Procedure:  The patient was identified in the holding area and taken to [facility] OR ROOM 16  The patient was then placed supine on the table. general anesthesia was administered.  The patient was prepped and draped in the usual sterile fashion.  A time out was called and antibiotics were administered.  A PA was necessary to expedite the procedure and assist with technical details.  He helped with exposure by providing suction and retraction.  He help with placement of the lead sutures by following them.  He help with wound closure.  Ultrasound was used to identify the carotid bifurcation in the left neck.  I began by making a transverse vision at this level.  Cautery was used divide subcutaneous tissue and platysma muscle.  I dissected down the medial border the sternocleidomastoid.  I encountered the common facial vein which was ligated between silk ties.  Next I exposed the carotid bifurcation which was mildly calcified.  Attention was then turned towards the pocket.  The patient had several scars from his previous pacemaker explant.  I therefore made a new incision medial to these.  Cautery was used about subcutaneous tissue down to the pectoralis fascia which was opened with cautery.  I then bluntly developed a plane between the  pectoralis muscle fibers.  The pocket was then created posterior to the pectoralis muscle.  Next, a tonsil was used to tunnel between the 2 incisions and the lead was brought through the tunnel.  The IPG was placed into the pocket and we began with device testing.  We had a good hemodynamic response.  The device was sutured then at position "A" with six 6-0 Prolene sutures.  The strain relief loop was then sutured to the common carotid artery with two 6-0 Prolene sutures.  Both incisions were then irrigated with antibiotic solution.  Hemostasis was achieved.  The IPG was then anchored to the chest wall with 2-0 Ethibond.  The infraclavicular incision was closed by reapproximating the fascia with 3-0 Vicryl followed by 4-0 Vicryl and the skin.  The carotid incision was closed by reapproximating the platysma muscle with 3-0 Vicryl followed by 4-0 Vicryl and the skin.  Dermabond was placed on the incisions.  The patient was successfully extubated taken recovery in stable condition.  There were no immediate complications.   Disposition: To PACU stable   V. Durene Cal, M.D., [facility] [place] Office: [phone] Pager:  [phone]

## 2023-03-17 NOTE — Anesthesia Procedure Notes (Signed)
Arterial Line Insertion Start/End8/22/2024 9:00 AM, 03/17/2023 9:10 AM Performed by: Ulla Potash, RN  Patient location: Pre-op. Preanesthetic checklist: patient identified, IV checked, site marked, risks and benefits discussed, surgical consent, monitors and equipment checked, pre-op evaluation, timeout performed and anesthesia consent Lidocaine 1% used for infiltration Left, radial was placed Catheter size: 20 G Hand hygiene performed  and maximum sterile barriers used   Attempts: 1 Procedure performed without using ultrasound guided technique. Following insertion, dressing applied and Biopatch. Post procedure assessment: normal and unchanged  Patient tolerated the procedure well with no immediate complications.

## 2023-03-17 NOTE — Anesthesia Procedure Notes (Signed)
Procedure Name: Intubation Date/Time: 03/17/2023 10:17 AM  Performed by: Cy Blamer, CRNAPre-anesthesia Checklist: Patient identified, Emergency Drugs available, Suction available and Patient being monitored Patient Re-evaluated:Patient Re-evaluated prior to induction Oxygen Delivery Method: Circle system utilized Preoxygenation: Pre-oxygenation with 100% oxygen Induction Type: IV induction Ventilation: Mask ventilation without difficulty Laryngoscope Size: Miller and 2 Grade View: Grade III Tube type: Oral Tube size: 7.5 mm Number of attempts: 1 Airway Equipment and Method: Stylet and Oral airway Placement Confirmation: ETT inserted through vocal cords under direct vision, positive ETCO2 and breath sounds checked- equal and bilateral Secured at: 23 cm Tube secured with: Tape Dental Injury: Teeth and Oropharynx as per pre-operative assessment  Difficulty Due To: Difficult Airway- due to anterior larynx Comments: BURP maneuver

## 2023-03-17 NOTE — Care Management CC44 (Signed)
Condition Code 44 Documentation Completed  Patient Details  Name: WISTER MOES MRN: [id number] Date of Birth: [birth year]   Condition Code 44 given:  Yes Patient signature on Condition Code 44 notice:  Yes Documentation of 2 MD's agreement:  Yes Code 44 added to claim:  Yes    Oletta Cohn, RN 03/17/2023, 2:11 PM

## 2023-03-18 ENCOUNTER — Telehealth: Payer: Self-pay | Admitting: Internal Medicine

## 2023-03-18 ENCOUNTER — Telehealth: Payer: Self-pay

## 2023-03-18 ENCOUNTER — Telehealth (HOSPITAL_COMMUNITY): Payer: Self-pay | Admitting: Cardiology

## 2023-03-18 DIAGNOSIS — I5022 Chronic systolic (congestive) heart failure: Secondary | ICD-10-CM

## 2023-03-18 DIAGNOSIS — I4891 Unspecified atrial fibrillation: Secondary | ICD-10-CM

## 2023-03-18 DIAGNOSIS — Z95810 Presence of automatic (implantable) cardiac defibrillator: Secondary | ICD-10-CM

## 2023-03-18 MED ORDER — AMIODARONE HCL 200 MG PO TABS: 200.0000 mg | ORAL_TABLET | Freq: Every day | ORAL | 11 refills | Status: DC

## 2023-03-18 NOTE — Telephone Encounter (Signed)
Please disregard

## 2023-03-18 NOTE — Discharge Summary (Signed)
    [name] is an 82 year old male who underwent insertion of left-sided Barostim by [name] on 03/17/2023 due to NYHA class III heart failure.  He tolerated the procedure well and was discharged home from PACU in stable condition.  [name], PA-C [facility] [phone] 03/18/2023  2:56 PM

## 2023-03-18 NOTE — Telephone Encounter (Signed)
Pt called to schedule f/u. Attempted to reach pt back and his VM has not been set up.

## 2023-03-18 NOTE — Telephone Encounter (Signed)
[facility] rep called to report pt requested refill on amio  Meds refilled

## 2023-03-21 ENCOUNTER — Telehealth: Payer: Self-pay

## 2023-03-21 ENCOUNTER — Encounter: Payer: Self-pay | Admitting: Podiatry

## 2023-03-21 NOTE — Telephone Encounter (Signed)
Returned pt's call regarding f/u after his Barostim surgery. Left VM for pt to call us back if he still needs assistance.

## 2023-03-21 NOTE — Telephone Encounter (Signed)
Defibrillator manufacturer information  ICD Lianne Moris MRI [facility] [id number] - [id number]  Inventory Item: ICD EVERA XT MRI DF1 [facility] Serial no.: [id number] Model/Cat no.: [id number] Implant name: ICD Lianne Moris MRI [facility] [id number] - [id number] Laterality: N/A Area: Manufacturer: [employer] Date of Manufacture:  Action: Implanted Number Used: 1  Device Identifier: Device Identifier Type:  MERM [id number] PROTECTA XT CRT-D [id number]  Inventory Item: Serial no.: [id number] Model/Cat no.: [id number] PROTECTA XT CRT-D Implant name: MERM [id number] Bertrum Sol CRT-D [id number] H Laterality: Area: Manufacturer: [employer] Date of Manufacture:  Action: Explanted Number Used: 1  Device Identifier: Device Identifier Type:

## 2023-03-22 ENCOUNTER — Telehealth: Payer: Self-pay

## 2023-03-22 NOTE — Telephone Encounter (Signed)
Pt called c/o increase in swelling and pain at [facility] site.  Reviewed pt's chart, returned call for clarification, two identifiers used. Pt stated that within the past 24 hrs he'd noticed approx 30-40% increase in the swelling in his L neck. Pt denies any drainage, redness, bruising, swallowing/eating/ breathing difficulty. He denies any coughing or sneezing. He endorses some soreness in his shoulder area as well. Instructed him to use heat on his shoulder and ice on his neck. Appt reserved for a triage appt, if necessary. He will monitor and call if the appt is not needed. He was given emergent S&S to monitor for and report to the ED if he experiences any. Confirmed understanding.

## 2023-03-23 ENCOUNTER — Telehealth: Payer: Self-pay

## 2023-03-23 ENCOUNTER — Other Ambulatory Visit: Payer: Self-pay | Admitting: Internal Medicine

## 2023-03-23 ENCOUNTER — Encounter (HOSPITAL_BASED_OUTPATIENT_CLINIC_OR_DEPARTMENT_OTHER): Payer: Self-pay

## 2023-03-23 ENCOUNTER — Inpatient Hospital Stay (HOSPITAL_BASED_OUTPATIENT_CLINIC_OR_DEPARTMENT_OTHER)
Admission: EM | Admit: 2023-03-23 | Discharge: 2023-04-01 | DRG: 904 | Disposition: A | Discharge status: Home Health | Payer: Medicare Other | Attending: Internal Medicine | Admitting: Internal Medicine

## 2023-03-23 ENCOUNTER — Other Ambulatory Visit: Payer: Self-pay

## 2023-03-23 ENCOUNTER — Ambulatory Visit (INDEPENDENT_AMBULATORY_CARE_PROVIDER_SITE_OTHER): Payer: Medicare Other

## 2023-03-23 ENCOUNTER — Emergency Department (HOSPITAL_BASED_OUTPATIENT_CLINIC_OR_DEPARTMENT_OTHER): Payer: Medicare Other

## 2023-03-23 DIAGNOSIS — T148XXA Other injury of unspecified body region, initial encounter: Secondary | ICD-10-CM

## 2023-03-23 DIAGNOSIS — Z7901 Long term (current) use of anticoagulants: Secondary | ICD-10-CM

## 2023-03-23 DIAGNOSIS — L7634 Postprocedural seroma of skin and subcutaneous tissue following other procedure: Secondary | ICD-10-CM | POA: Diagnosis present

## 2023-03-23 DIAGNOSIS — D649 Anemia, unspecified: Secondary | ICD-10-CM

## 2023-03-23 DIAGNOSIS — T888XXA Other specified complications of surgical and medical care, not elsewhere classified, initial encounter: Secondary | ICD-10-CM | POA: Diagnosis not present

## 2023-03-23 DIAGNOSIS — L84 Corns and callosities: Secondary | ICD-10-CM | POA: Diagnosis not present

## 2023-03-23 DIAGNOSIS — Z79899 Other long term (current) drug therapy: Secondary | ICD-10-CM

## 2023-03-23 DIAGNOSIS — I442 Atrioventricular block, complete: Secondary | ICD-10-CM | POA: Diagnosis not present

## 2023-03-23 DIAGNOSIS — L98491 Non-pressure chronic ulcer of skin of other sites limited to breakdown of skin: Secondary | ICD-10-CM | POA: Diagnosis present

## 2023-03-23 DIAGNOSIS — L7632 Postprocedural hematoma of skin and subcutaneous tissue following other procedure: Secondary | ICD-10-CM | POA: Diagnosis not present

## 2023-03-23 DIAGNOSIS — Z95810 Presence of automatic (implantable) cardiac defibrillator: Secondary | ICD-10-CM | POA: Diagnosis not present

## 2023-03-23 DIAGNOSIS — T45515A Adverse effect of anticoagulants, initial encounter: Secondary | ICD-10-CM | POA: Diagnosis present

## 2023-03-23 DIAGNOSIS — Z23 Encounter for immunization: Secondary | ICD-10-CM

## 2023-03-23 DIAGNOSIS — I4891 Unspecified atrial fibrillation: Secondary | ICD-10-CM | POA: Diagnosis not present

## 2023-03-23 DIAGNOSIS — I5022 Chronic systolic (congestive) heart failure: Secondary | ICD-10-CM | POA: Diagnosis present

## 2023-03-23 DIAGNOSIS — E032 Hypothyroidism due to medicaments and other exogenous substances: Secondary | ICD-10-CM | POA: Diagnosis present

## 2023-03-23 DIAGNOSIS — Y848 Other medical procedures as the cause of abnormal reaction of the patient, or of later complication, without mention of misadventure at the time of the procedure: Secondary | ICD-10-CM | POA: Diagnosis present

## 2023-03-23 DIAGNOSIS — Z09 Encounter for follow-up examination after completed treatment for conditions other than malignant neoplasm: Principal | ICD-10-CM

## 2023-03-23 DIAGNOSIS — Z9889 Other specified postprocedural states: Secondary | ICD-10-CM | POA: Diagnosis not present

## 2023-03-23 DIAGNOSIS — I428 Other cardiomyopathies: Secondary | ICD-10-CM | POA: Diagnosis not present

## 2023-03-23 DIAGNOSIS — I130 Hypertensive heart and chronic kidney disease with heart failure and stage 1 through stage 4 chronic kidney disease, or unspecified chronic kidney disease: Secondary | ICD-10-CM | POA: Diagnosis present

## 2023-03-23 DIAGNOSIS — D6832 Hemorrhagic disorder due to extrinsic circulating anticoagulants: Secondary | ICD-10-CM | POA: Diagnosis present

## 2023-03-23 DIAGNOSIS — H902 Conductive hearing loss, unspecified: Secondary | ICD-10-CM | POA: Diagnosis not present

## 2023-03-23 DIAGNOSIS — R079 Chest pain, unspecified: Secondary | ICD-10-CM | POA: Diagnosis not present

## 2023-03-23 DIAGNOSIS — Z8679 Personal history of other diseases of the circulatory system: Secondary | ICD-10-CM | POA: Diagnosis not present

## 2023-03-23 DIAGNOSIS — S21109A Unspecified open wound of unspecified front wall of thorax without penetration into thoracic cavity, initial encounter: Secondary | ICD-10-CM | POA: Diagnosis not present

## 2023-03-23 DIAGNOSIS — G4733 Obstructive sleep apnea (adult) (pediatric): Secondary | ICD-10-CM | POA: Diagnosis not present

## 2023-03-23 DIAGNOSIS — I9789 Other postprocedural complications and disorders of the circulatory system, not elsewhere classified: Secondary | ICD-10-CM | POA: Diagnosis not present

## 2023-03-23 DIAGNOSIS — I484 Atypical atrial flutter: Secondary | ICD-10-CM | POA: Diagnosis not present

## 2023-03-23 DIAGNOSIS — Z952 Presence of prosthetic heart valve: Secondary | ICD-10-CM | POA: Diagnosis not present

## 2023-03-23 DIAGNOSIS — N1832 Chronic kidney disease, stage 3b: Secondary | ICD-10-CM | POA: Diagnosis present

## 2023-03-23 DIAGNOSIS — N184 Chronic kidney disease, stage 4 (severe): Secondary | ICD-10-CM | POA: Diagnosis present

## 2023-03-23 DIAGNOSIS — I4821 Permanent atrial fibrillation: Secondary | ICD-10-CM | POA: Diagnosis present

## 2023-03-23 DIAGNOSIS — R0989 Other specified symptoms and signs involving the circulatory and respiratory systems: Secondary | ICD-10-CM | POA: Diagnosis not present

## 2023-03-23 DIAGNOSIS — Y92009 Unspecified place in unspecified non-institutional (private) residence as the place of occurrence of the external cause: Secondary | ICD-10-CM | POA: Diagnosis not present

## 2023-03-23 DIAGNOSIS — D62 Acute posthemorrhagic anemia: Secondary | ICD-10-CM | POA: Diagnosis present

## 2023-03-23 DIAGNOSIS — R222 Localized swelling, mass and lump, trunk: Secondary | ICD-10-CM

## 2023-03-23 DIAGNOSIS — Z79890 Hormone replacement therapy (postmenopausal): Secondary | ICD-10-CM | POA: Diagnosis not present

## 2023-03-23 DIAGNOSIS — E039 Hypothyroidism, unspecified: Secondary | ICD-10-CM | POA: Diagnosis not present

## 2023-03-23 DIAGNOSIS — S2020XA Contusion of thorax, unspecified, initial encounter: Secondary | ICD-10-CM | POA: Diagnosis not present

## 2023-03-23 DIAGNOSIS — I5023 Acute on chronic systolic (congestive) heart failure: Secondary | ICD-10-CM | POA: Diagnosis present

## 2023-03-23 DIAGNOSIS — S20219A Contusion of unspecified front wall of thorax, initial encounter: Secondary | ICD-10-CM | POA: Diagnosis not present

## 2023-03-23 DIAGNOSIS — I517 Cardiomegaly: Secondary | ICD-10-CM | POA: Diagnosis not present

## 2023-03-23 DIAGNOSIS — T8189XA Other complications of procedures, not elsewhere classified, initial encounter: Secondary | ICD-10-CM | POA: Diagnosis not present

## 2023-03-23 DIAGNOSIS — I472 Ventricular tachycardia, unspecified: Secondary | ICD-10-CM | POA: Diagnosis not present

## 2023-03-23 DIAGNOSIS — I482 Chronic atrial fibrillation, unspecified: Secondary | ICD-10-CM | POA: Diagnosis present

## 2023-03-23 DIAGNOSIS — Z8249 Family history of ischemic heart disease and other diseases of the circulatory system: Secondary | ICD-10-CM

## 2023-03-23 DIAGNOSIS — I959 Hypotension, unspecified: Secondary | ICD-10-CM | POA: Diagnosis not present

## 2023-03-23 LAB — CUP PACEART REMOTE DEVICE CHECK
Battery Remaining Longevity: 26 mo
Battery Voltage: 2.93 V
Brady Statistic AP VP Percent: 41.18 %
Brady Statistic AP VS Percent: 0.93 %
Brady Statistic AS VP Percent: 50.09 %
Brady Statistic AS VS Percent: 7.8 %
Brady Statistic RA Percent Paced: 33.96 %
Brady Statistic RV Percent Paced: 89.91 %
Date Time Interrogation Session: 20240828001803
HighPow Impedance: 67 Ohm
Implantable Lead Connection Status: 753985
Implantable Lead Connection Status: 753985
Implantable Lead Implant Date: 20130110
Implantable Lead Implant Date: 20130110
Implantable Lead Location: 753859
Implantable Lead Location: 753860
Implantable Lead Model: 5076
Implantable Lead Model: 6935
Implantable Pulse Generator Implant Date: 20181010
Lead Channel Impedance Value: 342 Ohm
Lead Channel Impedance Value: 399 Ohm
Lead Channel Impedance Value: 399 Ohm
Lead Channel Pacing Threshold Amplitude: 0.875 V
Lead Channel Pacing Threshold Pulse Width: 0.4 ms
Lead Channel Sensing Intrinsic Amplitude: 0.375 mV
Lead Channel Sensing Intrinsic Amplitude: 0.375 mV
Lead Channel Sensing Intrinsic Amplitude: 3.25 mV
Lead Channel Sensing Intrinsic Amplitude: 3.25 mV
Lead Channel Setting Pacing Amplitude: 2.5 V
Lead Channel Setting Pacing Amplitude: 2.5 V
Lead Channel Setting Pacing Pulse Width: 0.4 ms
Lead Channel Setting Sensing Sensitivity: 0.3 mV
Zone Setting Status: 755011

## 2023-03-23 LAB — CBC
HCT: 24.8 % — ABNORMAL LOW (ref 39.0–52.0)
HCT: 27.8 % — ABNORMAL LOW (ref 39.0–52.0)
Hemoglobin: 7.8 g/dL — ABNORMAL LOW (ref 13.0–17.0)
Hemoglobin: 8.8 g/dL — ABNORMAL LOW (ref 13.0–17.0)
MCH: 31.1 pg (ref 26.0–34.0)
MCH: 30.4 pg (ref 26.0–34.0)
MCHC: 31.5 g/dL (ref 30.0–36.0)
MCHC: 31.7 g/dL (ref 30.0–36.0)
MCV: 98.8 fL (ref 80.0–100.0)
MCV: 96.2 fL (ref 80.0–100.0)
Platelets: 125 10*3/uL — ABNORMAL LOW (ref 150–400)
Platelets: 137 10*3/uL — ABNORMAL LOW (ref 150–400)
RBC: 2.51 MIL/uL — ABNORMAL LOW (ref 4.22–5.81)
RBC: 2.89 MIL/uL — ABNORMAL LOW (ref 4.22–5.81)
RDW: 16.8 % — ABNORMAL HIGH (ref 11.5–15.5)
RDW: 16.7 % — ABNORMAL HIGH (ref 11.5–15.5)
WBC: 8.9 10*3/uL (ref 4.0–10.5)
WBC: 11.9 10*3/uL — ABNORMAL HIGH (ref 4.0–10.5)
nRBC: 0 % (ref 0.0–0.2)
nRBC: 0 % (ref 0.0–0.2)

## 2023-03-23 LAB — CBC WITH DIFFERENTIAL/PLATELET
Abs Immature Granulocytes: 0.24 10*3/uL — ABNORMAL HIGH (ref 0.00–0.07)
Basophils Absolute: 0 10*3/uL (ref 0.0–0.1)
Basophils Relative: 0 %
Eosinophils Absolute: 0.1 10*3/uL (ref 0.0–0.5)
Eosinophils Relative: 1 %
HCT: 28.4 % — ABNORMAL LOW (ref 39.0–52.0)
Hemoglobin: 9.3 g/dL — ABNORMAL LOW (ref 13.0–17.0)
Immature Granulocytes: 3 %
Lymphocytes Relative: 5 %
Lymphs Abs: 0.4 10*3/uL — ABNORMAL LOW (ref 0.7–4.0)
MCH: 31 pg (ref 26.0–34.0)
MCHC: 32.7 g/dL (ref 30.0–36.0)
MCV: 94.7 fL (ref 80.0–100.0)
Monocytes Absolute: 1 10*3/uL (ref 0.1–1.0)
Monocytes Relative: 10 %
Neutro Abs: 7.8 10*3/uL — ABNORMAL HIGH (ref 1.7–7.7)
Neutrophils Relative %: 81 %
Platelets: 151 10*3/uL (ref 150–400)
RBC: 3 MIL/uL — ABNORMAL LOW (ref 4.22–5.81)
RDW: 16.8 % — ABNORMAL HIGH (ref 11.5–15.5)
WBC: 9.6 10*3/uL (ref 4.0–10.5)
nRBC: 0 % (ref 0.0–0.2)

## 2023-03-23 LAB — BRAIN NATRIURETIC PEPTIDE: B Natriuretic Peptide: 352.5 pg/mL — ABNORMAL HIGH (ref 0.0–100.0)

## 2023-03-23 LAB — BASIC METABOLIC PANEL
Anion gap: 10 (ref 5–15)
BUN: 48 mg/dL — ABNORMAL HIGH (ref 8–23)
CO2: 28 mmol/L (ref 22–32)
Calcium: 8.4 mg/dL — ABNORMAL LOW (ref 8.9–10.3)
Chloride: 97 mmol/L — ABNORMAL LOW (ref 98–111)
Creatinine, Ser: 2.44 mg/dL — ABNORMAL HIGH (ref 0.61–1.24)
GFR, Estimated: 26 mL/min — ABNORMAL LOW (ref >60)
Glucose, Bld: 131 mg/dL — ABNORMAL HIGH (ref 70–99)
Potassium: 4.4 mmol/L (ref 3.5–5.1)
Sodium: 135 mmol/L (ref 135–145)

## 2023-03-23 LAB — HEMOGLOBIN AND HEMATOCRIT, BLOOD
HCT: 27.4 % — ABNORMAL LOW (ref 39.0–52.0)
Hemoglobin: 8.7 g/dL — ABNORMAL LOW (ref 13.0–17.0)

## 2023-03-23 LAB — MAGNESIUM: Magnesium: 2.4 mg/dL (ref 1.7–2.4)

## 2023-03-23 LAB — PROTIME-INR
INR: 2 — ABNORMAL HIGH (ref 0.8–1.2)
Prothrombin Time: 22.7 seconds — ABNORMAL HIGH (ref 11.4–15.2)

## 2023-03-23 MED ORDER — SODIUM CHLORIDE 0.9 % IV BOLUS
500.0000 mL | Freq: Once | INTRAVENOUS | Status: AC
  Administered 2023-03-23: 500 mL via INTRAVENOUS

## 2023-03-23 MED ORDER — FUROSEMIDE 20 MG PO TABS: 60.0000 mg | ORAL_TABLET | Freq: Two times a day (BID) | ORAL | Status: DC

## 2023-03-23 MED ORDER — HYDROCODONE-ACETAMINOPHEN 7.5-325 MG PO TABS
1.0000 | ORAL_TABLET | Freq: Four times a day (QID) | ORAL | Status: DC | PRN
  Administered 2023-03-23 – 2023-03-25 (×5): 1 via ORAL
  Filled 2023-03-23 (×5): qty 1

## 2023-03-23 MED ORDER — HYDROCODONE-ACETAMINOPHEN 7.5-325 MG PO TABS: 1.0000 | ORAL_TABLET | Freq: Four times a day (QID) | ORAL | Status: DC | PRN

## 2023-03-23 MED ORDER — ONDANSETRON HCL 4 MG/2ML IJ SOLN
4.0000 mg | Freq: Once | INTRAMUSCULAR | Status: AC
  Administered 2023-03-23: 4 mg via INTRAVENOUS
  Filled 2023-03-23: qty 2

## 2023-03-23 MED ORDER — MELATONIN 5 MG PO TABS
5.0000 mg | ORAL_TABLET | Freq: Every day | ORAL | Status: DC
  Administered 2023-03-23 – 2023-03-31 (×9): 5 mg via ORAL
  Filled 2023-03-23 (×9): qty 1

## 2023-03-23 MED ORDER — FENTANYL CITRATE PF 50 MCG/ML IJ SOSY
25.0000 ug | PREFILLED_SYRINGE | Freq: Once | INTRAMUSCULAR | Status: AC
  Administered 2023-03-23: 25 ug via INTRAVENOUS
  Filled 2023-03-23: qty 1

## 2023-03-23 MED ORDER — MORPHINE SULFATE (PF) 4 MG/ML IV SOLN
4.0000 mg | Freq: Once | INTRAVENOUS | Status: AC
  Administered 2023-03-23: 4 mg via INTRAVENOUS
  Filled 2023-03-23: qty 1

## 2023-03-23 MED ORDER — QUINIDINE GLUCONATE ER 324 MG PO TBCR
324.0000 mg | EXTENDED_RELEASE_TABLET | Freq: Two times a day (BID) | ORAL | Status: DC
  Filled 2023-03-23: qty 1

## 2023-03-23 MED ORDER — AMIODARONE HCL 200 MG PO TABS
200.0000 mg | ORAL_TABLET | Freq: Every day | ORAL | Status: DC
  Administered 2023-03-24: 200 mg via ORAL
  Filled 2023-03-23: qty 1

## 2023-03-23 MED ORDER — LEVOTHYROXINE SODIUM 88 MCG PO TABS
88.0000 ug | ORAL_TABLET | Freq: Every day | ORAL | Status: DC
  Administered 2023-03-24 – 2023-04-01 (×8): 88 ug via ORAL
  Filled 2023-03-23 (×8): qty 1

## 2023-03-23 MED ORDER — HEPARIN (PORCINE) 25000 UT/250ML-% IV SOLN
1300.0000 [IU]/h | INTRAVENOUS | Status: DC
  Administered 2023-03-23: 1300 [IU]/h via INTRAVENOUS
  Filled 2023-03-23: qty 250

## 2023-03-23 MED ORDER — SODIUM CHLORIDE 0.9% FLUSH
3.0000 mL | Freq: Two times a day (BID) | INTRAVENOUS | Status: DC
  Administered 2023-03-24 – 2023-03-31 (×15): 3 mL via INTRAVENOUS

## 2023-03-23 MED ORDER — IRBESARTAN 75 MG PO TABS: 37.5000 mg | ORAL_TABLET | Freq: Every day | ORAL | Status: DC

## 2023-03-23 MED ORDER — METOPROLOL SUCCINATE ER 25 MG PO TB24
12.5000 mg | ORAL_TABLET | Freq: Every day | ORAL | Status: DC
  Administered 2023-03-24: 12.5 mg via ORAL
  Filled 2023-03-23: qty 1

## 2023-03-23 MED ORDER — HYDROMORPHONE HCL 1 MG/ML IJ SOLN: 0.5000 mg | INTRAMUSCULAR | Status: DC | PRN

## 2023-03-23 MED ORDER — HYDROMORPHONE HCL 1 MG/ML IJ SOLN
0.5000 mg | INTRAMUSCULAR | Status: DC | PRN
  Administered 2023-03-24 (×2): 0.5 mg via INTRAVENOUS
  Filled 2023-03-23 (×2): qty 1

## 2023-03-23 MED ORDER — SPIRONOLACTONE 12.5 MG HALF TABLET: 12.5000 mg | ORAL_TABLET | Freq: Every day | ORAL | Status: DC

## 2023-03-23 NOTE — Consult Note (Addendum)
Hospital Consult    Reason for Consult:  swelling after Barostim placement Requesting Physician:  Wallace Cullens MRN #:  [id number]  History of Present Illness: This is a 82 y.o. male who has hx of NYHA class III CHF and underwent insertion of left sided Barostim on 03/17/2023 by [name].  Pt is on coumadin for mechanical MVR and afib.  He states he has been on coumadin for over 20 years and this is managed by [facility].  Pt states that he was on Lovenox after procedure and was doing fine until Sunday when he started having swelling over the left chest and it has progressively gotten bigger since then.  He restarted his coumadin on Saturday.  He last took his Lovenox last night.  He was scheduled for INR check today and instructions on his AC.  The pain was getting unbearable and he presented to [facility] ER and was transferred to [facility] ER.  An INR is pending.  He has not eaten today and had a sip of something to drink around 6am.    Pt has hx of PAD and has been followed by our office for this in the past.  In March 2022, he had non compressible vessels and a great toe pressure of 89 on the right and 57 on the left.  He has been following with [name] for the past 3 months for a callus on the lateral aspect of the right foot.  He was scheduled for MRI of this but given he has AICD, he was scheduled to come to [facility] and this was supposed to happen today but unfortunately, he is here with probable hematoma.  He denies any pain in his feet at night and he states that he has been walking with a walker the past couple of months and he does not get claudication.  He is mostly limited in his walking by his shortness of breath.    Pt also has afib and he states that Dr. Shirlee Latch cut his amiodarone back to not taking on Wed or Sundays.    Pt went to school at [employer] and is excited about their football team this year.   He is here with his wife and good friend.   They reside at [facility].  The pt  is not on a statin for cholesterol management.  The pt is not on a daily aspirin.   Other AC:  Lovenox/coumadin The pt is on BB, diuretic, ARB for hypertension.   The pt is not on medication for diabetes PTA. 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   Conductive hearing loss    uses hearing aids   Hypothyroidism    Intraspinal abscess    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   Nonischemic cardiomyopathy (HCC)    Psychosexual dysfunction with inhibited sexual excitement    S/P mitral valve replacement    Syncope and collapse    Unspecified sleep apnea    last sleep study 11/07, uses cpap   Ventricular tachycardia (HCC)    VT (ventricular tachycardia) (HCC)     Past Surgical History:  Procedure Laterality Date   BIV  ICD GENERATOR CHANGEOUT N/A 05/04/2017   Procedure: BiV ICD Generator Changeout;  Surgeon: Marinus Maw, MD;  Location: [facility] INVASIVE CV LAB;  Service: Cardiovascular;  Laterality: N/A;   CARDIAC CATHETERIZATION  04/24/2002   CARDIAC VALVE REPLACEMENT     CARDIOVERSION  03/09/2012   Procedure: CARDIOVERSION;  Surgeon: Marinus Maw, MD;  Location: [facility] ENDOSCOPY;  Service: Cardiovascular;  Laterality: N/A;   CARDIOVERSION N/A 11/22/2013   Procedure: CARDIOVERSION;  Surgeon: Thurmon Fair, MD;  Location: [facility] ENDOSCOPY;  Service: Cardiovascular;  Laterality: N/A;   dental implants     ELECTROPHYSIOLOGIC STUDY N/A 09/15/2015   Procedure: Floyce Stakes Ablation;  Surgeon: Marinus Maw, MD;  Location: [facility] INVASIVE CV LAB;  Service: Cardiovascular;  Laterality: N/A;   Evacution of epidural lumbar epidural abscess  1999   INSERT / REPLACE /  REMOVE PACEMAKER  11/2008   MITRAL VALVE REPLACEMENT     w #[id number] [facility]   PERMANENT PACEMAKER INSERTION N/A 08/05/2011   Procedure: PERMANENT PACEMAKER INSERTION;  Surgeon: Marinus Maw, MD;  Location: [facility] CATH LAB;  Service: Cardiovascular;  Laterality: N/A;   RIGHT HEART CATH N/A 02/26/2021   Procedure: RIGHT HEART CATH;  Surgeon: Laurey Morale, MD;  Location: [facility] INVASIVE CV LAB;  Service: Cardiovascular;  Laterality: N/A;   THYROIDECTOMY     TONSILLECTOMY     VALVE REPLACEMENT  2000    No Known Allergies  Prior to Admission medications   Medication Sig Start Date End Date Taking? Authorizing Provider  acetaminophen (TYLENOL) 500 MG tablet Take 1,000 mg by mouth every 6 (six) hours as needed (pain).    [provider]  amiodarone (PACERONE) 200 MG tablet Take 1 tablet (200 mg total) by mouth daily. Do NOT take Amiodarone on Wednesday or Sunday. 03/18/23   Laurey Morale, MD  amoxicillin (AMOXIL) 500 MG capsule TAKE 4 CAPSULES BY MOUTH 30 MINUTES PRIOR TO DENTAL WORK 02/19/21   Marinus Maw, MD  enoxaparin (LOVENOX) 80 MG/0.8ML injection Inject 0.8 mLs (80 mg total) into the skin every 12 (twelve) hours. 03/11/23   Marinus Maw, MD  furosemide (LASIX) 20 MG tablet Take 3 tablets (60 mg total) by mouth 2 (two) times daily. 10/29/22   Milford, Anderson Malta, FNP  HYDROcodone-acetaminophen (NORCO) 5-325 MG tablet Take 1 tablet by mouth every 6 (six) hours as needed for moderate pain. 03/17/23   Schuh, McKenzi P, PA-C  levothyroxine (SYNTHROID) 88 MCG tablet TAKE 1 TABLET BY MOUTH EVERY DAY 03/23/23   [name], MD  melatonin 5 MG TABS Take 5 mg by mouth at bedtime.    [provider]  metoprolol succinate (TOPROL-XL) 25 MG 24 hr tablet Take 0.5 tablets (12.5 mg total) by mouth daily. 01/19/23   Laurey Morale, MD  Multiple Vitamin (MULTIVITAMIN WITH MINERALS) TABS tablet Take 1 tablet by mouth in the morning.    [provider]  mupirocin ointment (BACTROBAN) 2  % Apply 1 Application topically daily. 03/01/23   McDonald, Rachelle Hora, DPM  nitroGLYCERIN (NITROSTAT) 0.4 MG SL tablet Place 1 tablet (0.4 mg total) under the tongue every 5 (five) minutes x 3 doses as needed for chest pain. 10/14/18   Abelino Derrick, PA-C  quiniDINE gluconate 324 MG CR tablet TAKE 1 TABLET BY MOUTH 2 TIMES DAILY. 01/25/23   Laurey Morale, MD  spironolactone (ALDACTONE) 25 MG tablet TAKE 1/2 TABLET BY MOUTH EVERY DAY 12/03/22   Laurey Morale, MD  valsartan (  DIOVAN) 40 MG tablet Take 40 mg by mouth daily.    [provider]  warfarin (COUMADIN) 5 MG tablet TAKE AS DIRECTED BY THE COUMADIN CLINIC Patient taking differently: Take 2.5-5 mg by mouth See admin instructions. Take 5 mg Mon., Wed., and Friday all the other days take 2.5 mg in the evening TAKE AS DIRECTED BY THE COUMADIN CLINIC 09/06/22   Laurey Morale, MD  zinc gluconate 50 MG tablet Take 1 tablet (50 mg total) by mouth daily. 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  Has one daughter.  Retired.  Education: Masters.   Social Determinants of Health   Financial Resource Strain: Low Risk  (03/08/2023)   Overall Financial Resource Strain (CARDIA)    Difficulty of Paying Living Expenses: Not hard at all  Food Insecurity: No Food Insecurity (03/08/2023)   Hunger Vital Sign    Worried About  Running Out of Food in the Last Year: Never true    Ran Out of Food in the Last Year: Never true  Transportation Needs: No Transportation Needs (03/08/2023)   PRAPARE - Administrator, Civil Service (Medical): No    Lack of Transportation (Non-Medical): No  Physical Activity: Insufficiently Active (03/08/2023)   Exercise Vital Sign    Days of Exercise per Week: 4 days    Minutes of Exercise per Session: 30 min  Stress: No Stress Concern Present (03/08/2023)   [employer] of Occupational Health - Occupational Stress Questionnaire    Feeling of Stress : Not at all  Social Connections: Moderately Integrated (03/08/2023)   Social Connection and Isolation Panel [NHANES]    Frequency of Communication with Friends and Family: More than three times a week    Frequency of Social Gatherings with Friends and Family: Twice a week    Attends Religious Services: More than 4 times per year    Active Member of [employer] or Organizations: No    Attends Banker Meetings: Never    Marital Status: Married  Catering manager Violence: Not At Risk (03/08/2023)   Humiliation, Afraid, Rape, and Kick questionnaire    Fear of Current or Ex-Partner: No    Emotionally Abused: No    Physically Abused: No    Sexually Abused: No   Family History  Problem Relation Age of Onset   Heart disease Mother    Heart failure Mother    Heart disease Father    Heart failure Father    Sleep apnea Neg Hx     ROS: [x]  Positive   [ ]  Negative   [ ]  All sytems reviewed and are negative  Cardiac: []  chest pain/pressure [x]  hx mechanical MVR [x]  SOB  [x]  AICD   Vascular: []  pain in  legs while walking []  pain in legs at rest []  pain in legs at night [x]  non-healing ulcers-callus []  hx of DVT []  swelling in legs  Pulmonary: []  asthma/wheezing []  home O2  Neurologic: []  hx of CVA []  mini stroke   Hematologic: []  hx of cancer  Endocrine:   []  diabetes [x]  thyroid disease  GI []   GERD  GU: [x]  CKD/renal failure []  HD--[]  M/W/F or []  T/T/S  Psychiatric: []  anxiety []  depression  Musculoskeletal: []  arthritis []  joint pain  Integumentary: []  rashes []  ulcers  Constitutional: []  fever  []  chills  Physical Examination  Vitals:   03/23/23 1100 03/23/23 1207  BP: 110/72 121/75  Pulse: (!) 59 62  Resp: 16 19  Temp:  (!) 97.3 F (36.3 C)  SpO2: 98% 100%   Body mass index is 25.73 kg/m.  General:  WDWN in NAD Gait: Not observed HENT: WNL, normocephalic Pulmonary: normal non-labored breathing Skin: without rashes Vascular Exam/Pulses: Bilateral femoral pulses are palpable. Pedal pulses are not palpable Extremities: callus right lateral foot Hemosiderin staining BLE  Musculoskeletal: no muscle wasting or atrophy  Neurologic: A&O X 3 Psychiatric:  The pt has Normal affect.   CBC    Component Value Date/Time   WBC 9.6 03/23/2023 1018   RBC 3.00 (L) 03/23/2023 1018   HGB 9.3 (L) 03/23/2023 1018   HGB 16.6 12/04/2020 1445   HCT 28.4 (L) 03/23/2023 1018   HCT 50.1 12/04/2020 1445   PLT 151 03/23/2023 1018   PLT 158 12/04/2020 1445   MCV 94.7 03/23/2023 1018   MCV 94 12/04/2020 1445   MCH 31.0 03/23/2023 1018   MCHC 32.7 03/23/2023 1018   RDW 16.8 (H) 03/23/2023 1018   RDW 13.7 12/04/2020 1445   LYMPHSABS 0.4 (L) 03/23/2023 1018   MONOABS 1.0 03/23/2023 1018   EOSABS 0.1 03/23/2023 1018   BASOSABS 0.0 03/23/2023 1018    BMET    Component Value Date/Time   NA 135 03/23/2023 1018   NA 139 01/29/2021 0853   K 4.4 03/23/2023 1018   CL 97 (L) 03/23/2023 1018   CO2 28 03/23/2023 1018   GLUCOSE 131 (H) 03/23/2023 1018   BUN 48 (H) 03/23/2023 1018   BUN 18 01/29/2021 0853   CREATININE 2.44 (H) 03/23/2023 1018   CREATININE 1.22 (H) 09/12/2015 0846   CALCIUM 8.4 (L) 03/23/2023 1018   GFRNONAA 26 (L) 03/23/2023 1018   GFRAA 56 (L) 04/30/2020 1144    COAGS: Lab Results  Component Value Date   INR 1.4 (H) 03/17/2023   INR 3.6  (A) 03/07/2023   INR 3.2 (A) 02/21/2023    INR: 2.0  hgb: 8.7 platelets: 151k  BUN/Cr: 48/2.44    ASSESSMENT/PLAN: This is a 82 y.o. male who has hx of NYHA class III CHF and underwent insertion of left sided Barostim on 03/17/2023 by [name].  Pt is on coumadin for mechanical MVR and afib and presented to the ER with painful swelling of the left chest over the past 3 days  S/p Barostim placement -pt with significant swelling of left chest most likely hematoma from anticoagulation.  He is on [facility] for mechanical MVR and afib.  His INR today is 2.0 and last dose of Lovenox was last evening.   -he will most likely need I&D. -Dr. Chestine Spore to evaluate pt and determine further plan  Painful callus right foot -this has been present for 3 months and followed by [name].  He was scheduled for MRI today  but obviously this will not get done due to the above.   -he has hx of CKD 3b.  May need arteriogram with CO2 in the future if this does not improve, but this is currently not his pressing issue.    Doreatha Massed, PA-C [facility] [phone]  I have seen and evaluated the patient. I agree with the PA note as documented above.  82 year old male with nonischemic cardiomyopathy, mechanical mitral valve on coumadin, class III heart failure that presents as a [facility] for evaluation of left chest wall incision where he had a barostim implant last week on 03/17/2023 by [name].  States he started noticing swelling in his left chest wall on Sunday.  This has gotten progressively worse.  On exam he appears to have a large hematoma from the barostim insertion site.  I did check an INR that is 2.  I discussed with [name] and will plan to let his Coumadin washout and then plan washout of his hematoma later this week - likely Friday.  Please hold his Coumadin.  Would appreciate medicine for admission.  Okay to bridge with heparin.  Vascular surgery will follow.  Have  updated family at bedside.  Cephus Shelling, MD [facility] [place] Office: [phone]

## 2023-03-23 NOTE — ED Triage Notes (Signed)
In for eval of severe swelling and pain to left chest onset 2 days ago. PMH Barostim placement on 03/17/2023. Reports gaining 4 lbs. over night. Hypotensive. 73/44 this am at home.

## 2023-03-23 NOTE — ED Notes (Signed)
Phlebotomy called for lab draw 

## 2023-03-23 NOTE — ED Notes (Signed)
ED TO INPATIENT HANDOFF REPORT  ED Nurse Name and Phone #: Delice Bison, RN  S Name/Age/Gender Austin [name] 82 y.o. male Room/Bed: 042C/042C  Code Status   Code Status: Full Code  Home/SNF/Other Home Patient oriented to: self, place, time, and situation Is this baseline? Yes   Triage Complete: Triage complete  Chief Complaint Fluid collection at surgical site, initial encounter [T88.8XXA]  Triage Note In for eval of severe swelling and pain to left chest onset 2 days ago. PMH Barostim placement on 03/17/2023. Reports gaining 4 lbs. over night. Hypotensive. 73/44 this am at home.    Allergies No Known Allergies  Level of Care/Admitting Diagnosis ED Disposition     ED Disposition  Admit   Condition  --   Comment  Hospital Area: [facility] [[id number]]  Level of Care: Progressive [102]  Admit to Progressive based on following criteria: Other see comments  Comments: Post operative hematoma, worsening anemia, needs to remain on anticoagulation due to mechanical valve.  May admit patient to [facility] or [facility] if equivalent level of care is available:: No  Covid Evaluation: Asymptomatic - no recent exposure (last 10 days) testing not required  Diagnosis: Fluid collection at surgical site, initial encounter [phone]  Admitting Physician: Synetta Fail [[id number]]  Attending Physician: Synetta Fail [[id number]]  Certification:: I certify this patient will need inpatient services for at least 2 midnights  Expected Medical Readiness: 03/26/2023          B Medical/Surgery History 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   Conductive hearing loss    uses  hearing aids   Hypothyroidism    Intraspinal abscess    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   Nonischemic cardiomyopathy (HCC)    Psychosexual dysfunction with inhibited sexual excitement    S/P mitral valve replacement    Syncope and collapse    Unspecified sleep apnea    last sleep study 11/07, uses cpap   Ventricular tachycardia (HCC)    VT (ventricular tachycardia) (HCC)    Past Surgical History:  Procedure Laterality Date   BIV ICD GENERATOR CHANGEOUT N/A 05/04/2017   Procedure: BiV ICD Generator Changeout;  Surgeon: Marinus Maw, MD;  Location: [facility] INVASIVE CV LAB;  Service: Cardiovascular;  Laterality: N/A;   CARDIAC CATHETERIZATION  04/24/2002   CARDIAC VALVE REPLACEMENT     CARDIOVERSION  03/09/2012   Procedure: CARDIOVERSION;  Surgeon: Marinus Maw, MD;  Location: [facility] ENDOSCOPY;  Service: Cardiovascular;  Laterality: N/A;   CARDIOVERSION N/A 11/22/2013   Procedure: CARDIOVERSION;  Surgeon: Thurmon Fair, MD;  Location: [facility] ENDOSCOPY;  Service: Cardiovascular;  Laterality: N/A;   dental implants     ELECTROPHYSIOLOGIC STUDY N/A 09/15/2015   Procedure: Floyce Stakes Ablation;  Surgeon: Marinus Maw, MD;  Location: [facility] INVASIVE CV LAB;  Service: Cardiovascular;  Laterality: N/A;   Evacution of epidural lumbar epidural abscess  1999   INSERT / REPLACE / REMOVE PACEMAKER  11/2008   MITRAL VALVE REPLACEMENT     w #[id number] [facility]   PERMANENT PACEMAKER INSERTION N/A 08/05/2011   Procedure: PERMANENT PACEMAKER INSERTION;  Surgeon: Marinus Maw, MD;  Location:  [facility] CATH LAB;  Service: Cardiovascular;  Laterality: N/A;   RIGHT HEART CATH N/A 02/26/2021   Procedure: RIGHT HEART CATH;  Surgeon: Laurey Morale, MD;  Location: [facility] INVASIVE CV LAB;  Service: Cardiovascular;  Laterality: N/A;   THYROIDECTOMY     TONSILLECTOMY     VALVE REPLACEMENT  2000     A IV Location/Drains/Wounds Patient Lines/Drains/Airways Status     Active  Line/Drains/Airways     Name Placement date Placement time Site Days   Peripheral IV 03/23/23 20 G 1" Right Antecubital 03/23/23  1019  Antecubital  less than 1            Intake/Output Last 24 hours No intake or output data in the 24 hours ending 03/23/23 1649  Labs/Imaging Results for orders placed or performed during the hospital encounter of 03/23/23 (from the past 48 hour(s))  CBC with Differential     Status: Abnormal   Collection Time: 03/23/23 10:18 AM  Result Value Ref Range   WBC 9.6 4.0 - 10.5 K/uL   RBC 3.00 (L) 4.22 - 5.81 MIL/uL   Hemoglobin 9.3 (L) 13.0 - 17.0 g/dL   HCT 16.1 (L) 09.6 - 04.5 %   MCV 94.7 80.0 - 100.0 fL   MCH 31.0 26.0 - 34.0 pg   MCHC 32.7 30.0 - 36.0 g/dL   RDW 40.9 (H) 81.1 - 91.4 %   Platelets 151 150 - 400 K/uL   nRBC 0.0 0.0 - 0.2 %   Neutrophils Relative % 81 %   Neutro Abs 7.8 (H) 1.7 - 7.7 K/uL   Lymphocytes Relative 5 %   Lymphs Abs 0.4 (L) 0.7 - 4.0 K/uL   Monocytes Relative 10 %   Monocytes Absolute 1.0 0.1 - 1.0 K/uL   Eosinophils Relative 1 %   Eosinophils Absolute 0.1 0.0 - 0.5 K/uL   Basophils Relative 0 %   Basophils Absolute 0.0 0.0 - 0.1 K/uL   Immature Granulocytes 3 %   Abs Immature Granulocytes 0.24 (H) 0.00 - 0.07 K/uL    Comment: Performed at [facility], [address] [place], [place] [address]  Basic metabolic panel     Status: Abnormal   Collection Time: 03/23/23 10:18 AM  Result Value Ref Range   Sodium 135 135 - 145 mmol/L   Potassium 4.4 3.5 - 5.1 mmol/L   Chloride 97 (L) 98 - 111 mmol/L   CO2 28 22 - 32 mmol/L   Glucose, Bld 131 (H) 70 - 99 mg/dL    Comment: Glucose reference range applies only to samples taken after fasting for at least 8 hours.   BUN 48 (H) 8 - 23 mg/dL   Creatinine, Ser 6.21 (H) 0.61 - 1.24 mg/dL   Calcium 8.4 (L) 8.9 - 10.3 mg/dL   GFR, Estimated 26 (L) >60 mL/min    Comment: (NOTE) Calculated using the CKD-EPI Creatinine Equation (2021)    Anion gap 10  5 - 15    Comment: Performed at [facility], [address] [place], [place] [address]  Brain natriuretic peptide     Status: Abnormal   Collection Time: 03/23/23 10:45 AM  Result Value Ref Range   B Natriuretic Peptide 352.5 (H) 0.0 - 100.0 pg/mL    Comment: Performed at [facility], [address], [place], [place] [address]  Hemoglobin and hematocrit, blood     Status: Abnormal   Collection Time: 03/23/23 12:23 PM  Result Value Ref Range   Hemoglobin 8.7 (  L) 13.0 - 17.0 g/dL   HCT 16.1 (L) 09.6 - 04.5 %    Comment: Performed at [facility], [address] [place], [place] [address]  Protime-INR     Status: Abnormal   Collection Time: 03/23/23 12:23 PM  Result Value Ref Range   Prothrombin Time 22.7 (H) 11.4 - 15.2 seconds   INR 2.0 (H) 0.8 - 1.2    Comment: (NOTE) INR goal varies based on device and disease states. Performed at [facility], [address] [place], [place] [address]    *Note: Due to a large number of results and/or encounters for the requested time period, some results have not been displayed. A complete set of results can be found in Results Review.   DG Chest Portable 1 View  Result Date: 03/23/2023 CLINICAL DATA:  Severe left-sided chest pain and swelling over the past 2 days. Recent left chest wall stimulator placement 6 days ago. Hypotension. EXAM: PORTABLE CHEST 1 VIEW COMPARISON:  Chest x-ray dated July 05, 2022. FINDINGS: New stimulator implant in the left chest wall with single lead terminating in the region of the left carotid artery. There is prominent asymmetric soft tissue density surrounding the stimulator in the left chest wall. Unchanged right chest wall AICD. Stable cardiomegaly status post mitral valve replacement. Low lung volumes are present, causing crowding of the pulmonary vasculature. No focal consolidation, pleural effusion, or pneumothorax. No acute osseous abnormality. IMPRESSION:  1. New stimulator implant in the left chest wall with prominent asymmetric soft tissue density surrounding the stimulator in the left chest wall, concerning for hematoma. 2. No acute cardiopulmonary disease. Electronically Signed   By: Obie Dredge M.D.   On: 03/23/2023 10:28    Pending Labs Unresulted Labs (From admission, onward)     Start     Ordered   03/25/23 0500  Heparin level (unfractionated)  Daily,   R      03/23/23 1453   03/24/23 0500  CBC  Daily,   R      03/23/23 1453   03/24/23 0500  Comprehensive metabolic panel  Tomorrow morning,   R        03/23/23 1506   03/24/23 0000  Heparin level (unfractionated)  Once-Timed,   URGENT        03/23/23 1452   03/23/23 1647  Magnesium  Once,   AD        03/23/23 1647   03/23/23 1600  CBC  Now then every 8 hours,   R (with TIMED occurrences)      03/23/23 1506   03/23/23 1600  Type and screen [facility]  Once,   R       Comments: [facility]    03/23/23 1506            Vitals/Pain Today's Vitals   03/23/23 1530 03/23/23 1600 03/23/23 1610 03/23/23 1623  BP: (!) 90/56 (!) 88/47  94/62  Pulse: 64 72  63  Resp: 17 (!) 27    Temp:      TempSrc:      SpO2: 91% 92%    Weight:      Height:      PainSc:   8      Isolation Precautions No active isolations  Medications Medications  heparin ADULT infusion 100 units/mL (25000 units/22mL) (1,300 Units/hr Intravenous New Bag/Given 03/23/23 1524)  sodium chloride flush (NS) 0.9 % injection 3 mL (3 mLs Intravenous Not Given  03/23/23 1519)  HYDROmorphone (DILAUDID) injection 0.5 mg (has no administration in time range)  HYDROcodone-acetaminophen (NORCO) 7.5-325 MG per tablet 1 tablet (1 tablet Oral Given 03/23/23 1537)  amiodarone (PACERONE) tablet 200 mg (has no administration in time range)  furosemide (LASIX) tablet 60 mg (has no administration in time range)  levothyroxine (SYNTHROID) tablet 88 mcg (has no administration in time range)   melatonin tablet 5 mg (has no administration in time range)  metoprolol succinate (TOPROL-XL) 24 hr tablet 12.5 mg (has no administration in time range)  quiniDINE gluconate CR tablet 324 mg (has no administration in time range)  spironolactone (ALDACTONE) tablet 12.5 mg (has no administration in time range)  irbesartan (AVAPRO) tablet 37.5 mg (has no administration in time range)  sodium chloride 0.9 % bolus 500 mL (0 mLs Intravenous Stopped 03/23/23 1117)  fentaNYL (SUBLIMAZE) injection 25 mcg (25 mcg Intravenous Given 03/23/23 1026)  morphine (PF) 4 MG/ML injection 4 mg (4 mg Intravenous Given 03/23/23 1227)  ondansetron (ZOFRAN) injection 4 mg (4 mg Intravenous Given 03/23/23 1225)  sodium chloride 0.9 % bolus 500 mL (500 mLs Intravenous New Bag/Given 03/23/23 1617)    Mobility walks     Focused Assessments Cardiac Assessment Handoff:    Lab Results  Component Value Date   CKTOTAL 201 12/15/2016   TROPONINI 0.24 (HH) 10/13/2018   No results found for: "DDIMER" Does the Patient currently have chest pain? No    R Recommendations: See Admitting Provider Note  Report given to:   Additional Notes:

## 2023-03-23 NOTE — ED Notes (Signed)
Pt bib [facility] coming from [facility]. Pt coming in with swelling to left side of chest r/t recent surgical site. [facility] states this swelling developed last night. CL notes petichaie to left side and bruising to left flank. Pt has hx of Afib, CHF, cardiomyopathy, HTN. Pt alert and oriented x4.   [facility] VS: 120s pressure 60-70 HR

## 2023-03-23 NOTE — Progress Notes (Signed)
ANTICOAGULATION CONSULT NOTE - Initial Consult  Pharmacy Consult for heparin  Indication:  afib with a mechanical MVR  No Known Allergies  Patient Measurements: Height: 6\' 1"  (185.4 cm) Weight: 88.5 kg (195 lb) IBW/kg (Calculated) : 79.9 Heparin Dosing Weight: 88.5 kg  Vital Signs: Temp: 97.3 F (36.3 C) (08/28 1207) Temp Source: Oral (08/28 1207) BP: 121/75 (08/28 1207) Pulse Rate: 62 (08/28 1207)  Labs: Recent Labs    03/23/23 1018 03/23/23 1223  HGB 9.3* 8.7*  HCT 28.4* 27.4*  PLT 151  --   LABPROT  --  22.7*  INR  --  2.0*  CREATININE 2.44*  --     Estimated Creatinine Clearance: 27.3 mL/min (A) (by C-G formula based on SCr of 2.44 mg/dL (H)).   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   Conductive hearing loss    uses hearing aids   Hypothyroidism    Intraspinal abscess    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   Nonischemic cardiomyopathy (HCC)    Psychosexual dysfunction with inhibited sexual excitement    S/P mitral valve replacement    Syncope and collapse    Unspecified sleep apnea    last sleep study 11/07, uses cpap   Ventricular tachycardia (HCC)    VT (ventricular tachycardia) (HCC)     Medications:  (Not in a hospital admission)  Scheduled:  Infusions:   Assessment: 82 yo male presenting with hematoma on warfarin PTA for afib with a history of mechanical MVR. Home dose is 5 mg M/W/F and 2.5 mg all other days. Pt previously had a procedure and was started on lovenox and restarted his warfarin on Saturday, 8/24. INR today 2.0. Last dose of lovenox was last night at 2100. Pharmacy has been consulted to dose  heparin in anticipation of I and D.   Goal of Therapy:  Heparin level 0.3-0.7 units/ml Monitor platelets by anticoagulation protocol: Yes   Plan:  No bolus due to presentation with hematoma Start heparin at 1300 units/hr  8 hour heparin level  Daily heparin level and CBC while on heparin  Monitor for s/sx of bleeding/ hematoma expansion  Lancelot Alyea I Ryen Rhames 03/23/2023,2:26 PM

## 2023-03-23 NOTE — ED Provider Notes (Addendum)
[place] EMERGENCY DEPARTMENT AT [facility] Provider Note   CSN: [id number] Arrival date & time: 03/23/23  0941     History  Chief Complaint  Patient presents with   Swelling left chest    Fisher Mehr is a 82 y.o. male.  Patient is an 82 year old male postop day 8 from Barostim stem on 03/15/2023 by [name] presenting for pain and swelling at the surgical incision site.  Please see attached photos.  Patient has had significant swelling with a 4 pound weight count overnight.  Now having shortness of breath and chest tightness.  The history is provided by the patient. No language interpreter was used.       Home Medications Prior to Admission medications   Medication Sig Start Date End Date Taking? Authorizing Provider  acetaminophen (TYLENOL) 500 MG tablet Take 1,000 mg by mouth every 6 (six) hours as needed (pain).    [provider]  amiodarone (PACERONE) 200 MG tablet Take 1 tablet (200 mg total) by mouth daily. Do NOT take Amiodarone on Wednesday or Sunday. 03/18/23   Laurey Morale, MD  amoxicillin (AMOXIL) 500 MG capsule TAKE 4 CAPSULES BY MOUTH 30 MINUTES PRIOR TO DENTAL WORK 02/19/21   Marinus Maw, MD  enoxaparin (LOVENOX) 80 MG/0.8ML injection Inject 0.8 mLs (80 mg total) into the skin every 12 (twelve) hours. 03/11/23   Marinus Maw, MD  furosemide (LASIX) 20 MG tablet Take 3 tablets (60 mg total) by mouth 2 (two) times daily. 10/29/22   Milford, Anderson Malta, FNP  HYDROcodone-acetaminophen (NORCO) 5-325 MG tablet Take 1 tablet by mouth every 6 (six) hours as needed for moderate pain. 03/17/23   Schuh, McKenzi P, PA-C  levothyroxine (SYNTHROID) 88 MCG tablet TAKE 1 TABLET BY MOUTH EVERY DAY 03/23/23   [name], MD  melatonin 5 MG TABS Take 5 mg by mouth at bedtime.    [provider]  metoprolol succinate (TOPROL-XL) 25 MG 24 hr tablet Take 0.5 tablets (12.5 mg total) by mouth daily. 01/19/23   Laurey Morale, MD   Multiple Vitamin (MULTIVITAMIN WITH MINERALS) TABS tablet Take 1 tablet by mouth in the morning.    [provider]  mupirocin ointment (BACTROBAN) 2 % Apply 1 Application topically daily. 03/01/23   McDonald, Rachelle Hora, DPM  nitroGLYCERIN (NITROSTAT) 0.4 MG SL tablet Place 1 tablet (0.4 mg total) under the tongue every 5 (five) minutes x 3 doses as needed for chest pain. 10/14/18   Abelino Derrick, PA-C  quiniDINE gluconate 324 MG CR tablet TAKE 1 TABLET BY MOUTH 2 TIMES DAILY. 01/25/23   Laurey Morale, MD  spironolactone (ALDACTONE) 25 MG tablet TAKE 1/2 TABLET BY MOUTH EVERY DAY 12/03/22   Laurey Morale, MD  valsartan (DIOVAN) 40 MG tablet Take 40 mg by mouth daily.    [provider]  warfarin (COUMADIN) 5 MG tablet TAKE AS DIRECTED BY THE COUMADIN CLINIC Patient taking differently: Take 2.5-5 mg by mouth See admin instructions. Take 5 mg Mon., Wed., and Friday all the other days take 2.5 mg in the evening TAKE AS DIRECTED BY THE COUMADIN CLINIC 09/06/22   Laurey Morale, MD  zinc gluconate 50 MG tablet Take 1 tablet (50 mg total) by mouth daily. 05/25/22   [name], MD      Allergies    Patient has no known allergies.    Review of Systems   Review of Systems  Constitutional:  Negative for  chills and fever.  HENT:  Negative for ear pain and sore throat.   Eyes:  Negative for pain and visual disturbance.  Respiratory:  Negative for cough and shortness of breath.   Cardiovascular:  Negative for chest pain and palpitations.  Gastrointestinal:  Negative for abdominal pain and vomiting.  Genitourinary:  Negative for dysuria and hematuria.  Musculoskeletal:  Negative for arthralgias and back pain.  Skin:  Negative for color change and rash.  Neurological:  Negative for seizures and syncope.  All other systems reviewed and are negative.   Physical Exam Updated Vital Signs BP 96/63   Pulse 61   Temp (!) 97.5 F (36.4 C) (Oral)   Resp 15   Ht 6\' 1"  (1.854 m)    Wt 88.5 kg   SpO2 95%   BMI 25.73 kg/m  Physical Exam Vitals and nursing note reviewed.  Constitutional:      General: He is not in acute distress.    Appearance: He is well-developed.  HENT:     Head: Normocephalic and atraumatic.  Eyes:     Conjunctiva/sclera: Conjunctivae normal.  Cardiovascular:     Rate and Rhythm: Normal rate and regular rhythm.     Heart sounds: No murmur heard. Pulmonary:     Effort: Pulmonary effort is normal. No respiratory distress.     Breath sounds: Normal breath sounds.  Chest:    Abdominal:     Palpations: Abdomen is soft.     Tenderness: There is no abdominal tenderness.  Musculoskeletal:        General: No swelling.     Cervical back: Neck supple.  Skin:    General: Skin is warm and dry.     Capillary Refill: Capillary refill takes less than 2 seconds.  Neurological:     Mental Status: He is alert.  Psychiatric:        Mood and Affect: Mood normal.     ED Results / Procedures / Treatments   Labs (all labs ordered are listed, but only abnormal results are displayed) Labs Reviewed  CBC WITH DIFFERENTIAL/PLATELET - Abnormal; Notable for the following components:      Result Value   RBC 3.00 (*)    Hemoglobin 9.3 (*)    HCT 28.4 (*)    RDW 16.8 (*)    Neutro Abs 7.8 (*)    Lymphs Abs 0.4 (*)    Abs Immature Granulocytes 0.24 (*)    All other components within normal limits  BASIC METABOLIC PANEL  BRAIN NATRIURETIC PEPTIDE    EKG None  Radiology DG Chest Portable 1 View  Result Date: 03/23/2023 CLINICAL DATA:  Severe left-sided chest pain and swelling over the past 2 days. Recent left chest wall stimulator placement 6 days ago. Hypotension. EXAM: PORTABLE CHEST 1 VIEW COMPARISON:  Chest x-ray dated July 05, 2022. FINDINGS: New stimulator implant in the left chest wall with single lead terminating in the region of the left carotid artery. There is prominent asymmetric soft tissue density surrounding the stimulator in the  left chest wall. Unchanged right chest wall AICD. Stable cardiomegaly status post mitral valve replacement. Low lung volumes are present, causing crowding of the pulmonary vasculature. No focal consolidation, pleural effusion, or pneumothorax. No acute osseous abnormality. IMPRESSION: 1. New stimulator implant in the left chest wall with prominent asymmetric soft tissue density surrounding the stimulator in the left chest wall, concerning for hematoma. 2. No acute cardiopulmonary disease. Electronically Signed   By: Vickki Hearing.D.  On: 03/23/2023 10:28    Procedures Procedures    Medications Ordered in ED Medications  sodium chloride 0.9 % bolus 500 mL (500 mLs Intravenous New Bag/Given 03/23/23 1026)  fentaNYL (SUBLIMAZE) injection 25 mcg (25 mcg Intravenous Given 03/23/23 1026)    ED Course/ Medical Decision Making/ A&P                                 Medical Decision Making Amount and/or Complexity of Data Reviewed Labs: ordered. Radiology: ordered.  Risk Prescription drug management.   82 year old male postop day 8 from Barostim stem on 03/15/2023 by [name] presenting for pain and swelling at the surgical incision site.  Patient is alert and oriented x 3, no acute distress, afebrile. Hypotension present with BP 82/62.  Physical exam significant swelling surrounding the [facility] surgeon site.  See attached photos.  No warmth.  No induration.  No purulent drainage.  No wound dehiscence.  ECG on 03/23/23 at 9:58 poor due to swelling and movement of chest wall. No STEMI.  500cc fluid bolus given for hypotension. Will hold more fluids at this time due to CHF hx. CXR stable.  Concerns for likely hematoma versus seroma.  Basic labs sent.  Hemoglobin pending.  Chest x-ray stable.  I spoke with vascular surgeon Dr. Chestine Spore who request ED to ED transfer for evaluation.  He request to be notified upon transfer.  Patient accepted by ED physician Dr. Adela Lank.  Hemoglobin 9.3 down from  11.5 recorded 6 days ago.  Diagnosis favoring hematoma at this time.      Final Clinical Impression(s) / ED Diagnoses Final diagnoses:  Postop check  Chest swelling  Anemia, unspecified type  Hematoma    Rx / DC Orders ED Discharge Orders     None         Franne Forts, DO 03/23/23 1045    Edwin Dada P, DO 03/23/23 1050

## 2023-03-23 NOTE — ED Provider Notes (Signed)
Pt transferred from [facility] to see Vascular surgery.  Pt had a Barostim implanted by [name] on 8/20.  He is on coumadin for a mechanical mitral valve and afib.  He was on lovenox after procedure, but started back on the coumadin on Saturday, 8/24.  Pt noticed some swelling to his left chest wall on the 21st.  The pt went to [facility] today and was sent here to have vascular evaluate.  Pt d/w Dr. Chestine Spore who did see pt.  He thinks pt has a hematoma which will need evacuation.  However, he can't do it until his coumadin has been washed out.  He recommends admission to medicine to hold coumadin.  He recommends bridge with heparin.   Pt d/w Dr. Alinda Money ([facility]) for admission.     Jacalyn Lefevre, MD 03/23/23 [phone]

## 2023-03-23 NOTE — Progress Notes (Signed)
Subjective:   Austin [name] is a 82 y.o. male who presents for an Initial Medicare Annual Wellness Visit.  Visit Complete: Virtual  I connected with  Shea Evans on 03/23/23 by a audio enabled telemedicine application and verified that I am speaking with the correct person using two identifiers.  Patient Location: Home  Provider Location: Home Office  I discussed the limitations of evaluation and management by telemedicine. The patient expressed understanding and agreed to proceed.  Vital Signs: Because this visit was a virtual/telehealth visit, some criteria may be missing or patient reported. Any vitals not documented were not able to be obtained and vitals that have been documented are patient reported.    Review of Systems   Cardiac Risk Factors include: advanced age (>64men, >35 women);male gender, Risk factor comments: A-Fib, CSHF, OSA, CKD     Objective:    Today's Vitals   03/08/23 1409  Weight: 192 lb (87.1 kg)  Height: 6' 1.5" (1.867 m)   Body mass index is 24.99 kg/m.     03/23/2023   10:04 AM 03/08/2023    2:18 PM 02/26/2021   10:56 AM 12/20/2018    1:39 PM 10/12/2018    5:02 PM 05/04/2017    8:48 AM 10/26/2015   10:45 PM  Advanced Directives  Does Patient Have a Medical Advance Directive? Yes Yes Yes Yes Yes Yes Yes  Type of Estate agent of [place];Living will Healthcare Power of [place];Living will Healthcare Power of [place];Living will Healthcare Power of [place];Living will Living will;Healthcare Power of Attorney Living will;Healthcare Power of Attorney   Does patient want to make changes to medical advance directive? No - Patient declined No - Patient declined No - Patient declined  No - Patient declined No - Patient declined Yes - information given  Copy of Healthcare Power of Attorney in Chart?  No - copy requested   No - copy requested No - copy requested Yes    Current Medications (verified) No  facility-administered encounter medications on file as of 03/08/2023.   Outpatient Encounter Medications as of 03/08/2023  Medication Sig   acetaminophen (TYLENOL) 500 MG tablet Take 1,000 mg by mouth every 6 (six) hours as needed for mild pain.   furosemide (LASIX) 20 MG tablet Take 3 tablets (60 mg total) by mouth 2 (two) times daily.   melatonin 5 MG TABS Take 5 mg by mouth at bedtime.   metoprolol succinate (TOPROL-XL) 25 MG 24 hr tablet Take 0.5 tablets (12.5 mg total) by mouth daily.   Multiple Vitamin (MULTIVITAMIN WITH MINERALS) TABS tablet Take 1 tablet by mouth in the morning.   mupirocin ointment (BACTROBAN) 2 % Apply 1 Application topically daily.   nitroGLYCERIN (NITROSTAT) 0.4 MG SL tablet Place 1 tablet (0.4 mg total) under the tongue every 5 (five) minutes x 3 doses as needed for chest pain.   quiniDINE gluconate 324 MG CR tablet TAKE 1 TABLET BY MOUTH 2 TIMES DAILY.   spironolactone (ALDACTONE) 25 MG tablet TAKE 1/2 TABLET BY MOUTH EVERY DAY   valsartan (DIOVAN) 40 MG tablet Take 40 mg by mouth daily.   warfarin (COUMADIN) 5 MG tablet TAKE AS DIRECTED BY THE COUMADIN CLINIC (Patient taking differently: Take 2.5-5 mg by mouth See admin instructions. Take 5 mg by mouth on Mon., Wed., and Friday in the evening, all the other days take 2.5 mg by mouth in the evening TAKE AS DIRECTED BY THE COUMADIN CLINIC)   zinc gluconate 50 MG tablet  Take 1 tablet (50 mg total) by mouth daily.   [DISCONTINUED] amiodarone (PACERONE) 200 MG tablet Take 1 tablet (200 mg total) by mouth daily. Do NOT take Amiodarone on Wednesday or Sunday.   [DISCONTINUED] levothyroxine (SYNTHROID) 88 MCG tablet TAKE 1 TABLET BY MOUTH EVERY DAY   amoxicillin (AMOXIL) 500 MG capsule TAKE 4 CAPSULES BY MOUTH 30 MINUTES PRIOR TO DENTAL WORK    Allergies (verified) Patient has no known allergies.   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   Conductive hearing loss    uses hearing aids   Hypothyroidism    Intraspinal abscess    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   Nonischemic cardiomyopathy (HCC)    Psychosexual dysfunction with inhibited sexual excitement    S/P mitral valve replacement    Syncope and collapse    Unspecified sleep apnea    last sleep study 11/07, uses cpap   Ventricular tachycardia (HCC)    VT (ventricular tachycardia) (HCC)    Past Surgical History:  Procedure Laterality Date   BIV ICD GENERATOR CHANGEOUT N/A 05/04/2017   Procedure: BiV ICD Generator Changeout;  Surgeon: Marinus Maw, MD;  Location: [name] INVASIVE CV LAB;  Service: Cardiovascular;  Laterality: N/A;   CARDIAC CATHETERIZATION  04/24/2002   CARDIAC VALVE REPLACEMENT     CARDIOVERSION  03/09/2012   Procedure: CARDIOVERSION;  Surgeon: Marinus Maw, MD;  Location: [facility] ENDOSCOPY;  Service: Cardiovascular;  Laterality: N/A;   CARDIOVERSION N/A 11/22/2013   Procedure: CARDIOVERSION;  Surgeon: Thurmon Fair, MD;  Location: [facility] ENDOSCOPY;  Service: Cardiovascular;  Laterality: N/A;   dental implants     ELECTROPHYSIOLOGIC STUDY N/A 09/15/2015   Procedure: Floyce Stakes Ablation;  Surgeon: Marinus Maw, MD;  Location[facility] INVAS[facility] CV LAB;  Service: Cardiovascular;  Laterality: N/A;   Evacution of epidural lumbar epidural abscess  1999   INSERT / REPLACE / REMOVE PACEMAKER  11/2008   MITRAL VALVE REPLACEMENT     w #[id number] [facility]   PERMANENT PACEMAKER INSERTION N/A 08/05/2011   Procedure: PERMANENT PACEMAKER INSERTION;  Surgeon: Marinus Maw, MD;  Location: [facility] CATH LAB;  Service: Cardiovascular;  Laterality: N/A;   RIGHT HEART CATH N/A 02/26/2021   Procedure: RIGHT HEART CATH;  Surgeon: Laurey Morale, MD;  Location: [facility] INVASIVE CV LAB;  Service: Cardiovascular;  Laterality: N/A;   THYROIDECTOMY     TONSILLECTOMY     VALVE REPLACEMENT  2000   Family History  Problem Relation Age of Onset   Heart disease Mother    Heart failure Mother    Heart disease Father    Heart failure Father    Sleep apnea Neg Hx    Social History   Socioeconomic History   Marital status: Married    Spouse name: Not on file   Number of children: 1   Years of education: 30   Highest education level: Master's degree (e.g., MA, MS, MEng, MEd, MSW, MBA)  Occupational History   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  Has one daughter.  Retired.  Education: Masters.   Social Determinants of Health   Financial Resource Strain: Low Risk  (03/08/2023)   Overall Financial Resource Strain (CARDIA)    Difficulty of Paying Living Expenses: Not hard at all  Food Insecurity: No Food Insecurity (03/08/2023)   Hunger Vital Sign    Worried About Running Out of Food in the Last Year: Never true    Ran Out of Food in the Last Year: Never true  Transportation Needs: No Transportation Needs (03/08/2023)   PRAPARE - Administrator, Civil Service (Medical): No    Lack of Transportation (Non-Medical): No  Physical Activity: Insufficiently Active (03/08/2023)   Exercise Vital Sign    Days of Exercise per Week: 4 days    Minutes of Exercise per Session: 30 min  Stress: No Stress Concern Present (03/08/2023)   [employer] of Occupational Health - Occupational Stress Questionnaire    Feeling of Stress : Not at all  Social Connections: Moderately  Integrated (03/08/2023)   Social Connection and Isolation Panel [NHANES]    Frequency of Communication with Friends and Family: More than three times a week    Frequency of Social Gatherings with Friends and Family: Twice a week    Attends Religious Services: More than 4 times per year    Active Member of [employer] or Organizations: No    Attends Engineer, structural: Never    Marital Status: Married    Tobacco Counseling Counseling given: Not Answered   Clinical Intake:  Pre-visit preparation completed: Yes  Pain : No/denies pain     BMI - recorded: 24.99 Nutritional Status: BMI 25 -29 Overweight Nutritional Risks: None Diabetes: No  How often do you need to have someone help you when you read instructions, pamphlets, or other written materials from your doctor or pharmacy?: 1 - Never  Interpreter Needed?: No  Information entered by :: Asami Lambright, RMA   Activities of Daily Living    03/17/2023    8:07 AM 03/17/2023    8:05 AM  In your present state of health, do you have any difficulty performing the following activities:  Hearing?  1  Vision?  0  Difficulty concentrating or making decisions?  0  Walking or climbing stairs?  1  Dressing or bathing?  0  Doing errands, shopping? 0     Patient Care Team: [name], MD as PCP - General (Internal Medicine) Lyn Records, MD (Inactive) as PCP - Cardiology (Cardiology) Marinus Maw, MD as PCP - Electrophysiology (Cardiology)  Indicate any recent Medical Services you may have received from other than [facility] providers in the past year (date may be approximate).     Assessment:   This is a routine wellness examination for [place].  Hearing/Vision screen Hearing Screening - Comments:: Wears hearing aides Vision Screening - Comments:: Denies any vision issues.   Dietary issues and exercise activities discussed:     Goals Addressed               This Visit's Progress     Patient Stated  (pt-stated)        Staying healthy      Depression Screen  03/08/2023    2:23 PM 09/27/2022    3:33 PM 05/17/2022    9:49 AM 06/09/2016    9:53 AM 03/15/2016   11:37 AM  PHQ 2/9 Scores  PHQ - 2 Score 0 0 0 0 0  PHQ- 9 Score 1  1      Fall Risk    03/08/2023    2:19 PM 09/27/2022    3:33 PM 05/17/2022    9:50 AM 12/20/2018    1:39 PM 08/25/2018    2:59 PM  Fall Risk   Falls in the past year? 0 0 0 0 0  Number falls in past yr: 0 0 0  0  Injury with Fall? 0 0 0 0 0  Risk for fall due to : No Fall Risks No Fall Risks     Follow up Falls prevention discussed;Falls evaluation completed Falls evaluation completed   Falls evaluation completed    MEDICARE RISK AT HOME:    TIMED UP AND GO:  Was the test performed? No    Cognitive Function:        03/08/2023    2:19 PM  6CIT Screen  What Year? 0 points  What month? 0 points  What time? 0 points  Count back from 20 0 points  Months in reverse 2 points  Repeat phrase 2 points  Total Score 4 points    Immunizations Immunization History  Administered Date(s) Administered   Fluad Quad(high Dose 65+) 05/17/2022   Influenza, High Dose Seasonal PF 05/11/2017, 04/18/2018, 05/04/2019   PFIZER(Purple Top)SARS-COV-2 Vaccination 08/14/2019, 09/03/2019    TDAP status: Due, Education has been provided regarding the importance of this vaccine. Advised may receive this vaccine at local pharmacy or Health Dept. Aware to provide a copy of the vaccination record if obtained from local pharmacy or Health Dept. Verbalized acceptance and understanding.  Flu Vaccine status: Due, Education has been provided regarding the importance of this vaccine. Advised may receive this vaccine at local pharmacy or Health Dept. Aware to provide a copy of the vaccination record if obtained from local pharmacy or Health Dept. Verbalized acceptance and understanding.  Pneumococcal vaccine status: Due, Education has been provided regarding the importance of  this vaccine. Advised may receive this vaccine at local pharmacy or Health Dept. Aware to provide a copy of the vaccination record if obtained from local pharmacy or Health Dept. Verbalized acceptance and understanding.  Covid-19 vaccine status: Completed vaccines  Qualifies for Shingles Vaccine? Yes   Zostavax completed No   Shingrix Completed?: No.    Education has been provided regarding the importance of this vaccine. Patient has been advised to call insurance company to determine out of pocket expense if they have not yet received this vaccine. Advised may also receive vaccine at local pharmacy or Health Dept. Verbalized acceptance and understanding.  Screening Tests Health Maintenance  Topic Date Due   DTaP/Tdap/Td (1 - Tdap) Never done   Zoster Vaccines- Shingrix (1 of 2) Never done   Pneumonia Vaccine 55+ Years old (1 of 1 - PCV) Never done   COVID-19 Vaccine (3 - Pfizer risk series) 10/01/2019   INFLUENZA VACCINE  02/24/2023   Medicare Annual Wellness (AWV)  03/07/2024   HPV VACCINES  Aged Out    Health Maintenance  Health Maintenance Due  Topic Date Due   DTaP/Tdap/Td (1 - Tdap) Never done   Zoster Vaccines- Shingrix (1 of 2) Never done   Pneumonia Vaccine 33+ Years old (1 of 1 - PCV)  Never done   COVID-19 Vaccine (3 - Pfizer risk series) 10/01/2019   INFLUENZA VACCINE  02/24/2023    Colorectal cancer screening: No longer required.   Lung Cancer Screening: (Low Dose CT Chest recommended if Age 53-80 years, 20 pack-year currently smoking OR have quit w/in 15years.) does not qualify.   Lung Cancer Screening Referral: N/A  Additional Screening:  Hepatitis C Screening: does qualify; Completed N/A  Vision Screening: Recommended annual ophthalmology exams for early detection of glaucoma and other disorders of the eye. Is the patient up to date with their annual eye exam?  Yes  Who is the provider or what is the name of the office in which the patient attends annual eye  exams? Dr. Cathey Endow If pt is not established with a provider, would they like to be referred to a provider to establish care? No .   Dental Screening: Recommended annual dental exams for proper oral hygiene   Community Resource Referral / Chronic Care Management: CRR required this visit?  No   CCM required this visit?  No    Plan:     I have personally reviewed and noted the following in the patient's chart:   Medical and social history Use of alcohol, tobacco or illicit drugs  Current medications and supplements including opioid prescriptions. Patient is not currently taking opioid prescriptions. Functional ability and status Nutritional status Physical activity Advanced directives List of other physicians Hospitalizations, surgeries, and ER visits in previous 12 months Vitals Screenings to include cognitive, depression, and falls Referrals and appointments  In addition, I have reviewed and discussed with patient certain preventive protocols, quality metrics, and best practice recommendations. A written personalized care plan for preventive services as well as general preventive health recommendations were provided to patient.     Ahja Martello L Klynn Linnemann, CMA   03/23/2023   After Visit Summary: (MyChart) Due to this being a telephonic visit, the after visit summary with patients personalized plan was offered to patient via MyChart   Nurse Notes: Patient is due Tdap, Flu, Covid and Shingrix vaccines.  He has an eye appointment coming up soon with Dr. Cathey Endow.  Patient stated he would call office for a follow up visit with Dr. Yetta Barre.

## 2023-03-23 NOTE — H&P (Signed)
History and Physical   Austin [name]:[id number] DOB: [birth year] DOA: 03/23/2023  PCP: [name], MD   Patient coming from: Home  Chief Complaint: Left chest swelling and pain  HPI: Austin [name] is a 82 y.o. male with medical history significant of atrial fibrillation, CHF, mitral valve regurgitation status post mechanical valve replacement, hypothyroidism, OSA on CPAP, CKD 3B, V. 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  Patient underwent Barrows stimulator placement on 8/20 by vascular surgery due to symptomatic advanced CHF.  For the past several days he has had increasing pain and swelling at the site.  Does report some weight gain, up to 4 pounds, as well.  Began to develop some chest tightness and shortness of breath today.  Denies fevers, chills, abdominal pain, constipation, diarrhea, nausea, vomiting.  Surgeries are  ED Course: Vital signs in the ED notable for blood pressure in the 90s but improved to 100s to 110 systolic after IV fluids.  Heart rate in the 50s to 60s.  Lab workup included BMP with chloride 97, BUN 40, creatinine stable 2.44, glucose 131, calcium 8.4.  CBC with hemoglobin of 9.3 down from 11.56 days ago.  PT and INR elevated at 22.7 and 2.0 respectively.  BMP stable and lower than most recent results at 352.  H&H performed which showed hemoglobin 8.7 down from 9.3 after receiving IV fluids.  As above patient received 500 cc IV fluids, also received fentanyl, morphine, Zofran in the ED.  Vascular surgery consulted and will see the patient with plans to evacuate suspected hematoma but will wait for warfarin to washout and have patient on a heparin bridge in the meantime.  Review of Systems: As per HPI otherwise all other systems reviewed and are negative.  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    cx by infection, explantation11/12 & reimplant 1/13   CHF (congestive heart failure) (HCC)    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   Conductive hearing loss    uses hearing aids   Hypothyroidism    Intraspinal abscess    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   Nonischemic cardiomyopathy (HCC)    Psychosexual dysfunction with inhibited sexual excitement    S/P mitral valve replacement    Syncope and collapse    Unspecified sleep apnea    last sleep study 11/07, uses cpap   Ventricular tachycardia (HCC)    VT (ventricular tachycardia) (HCC)     Past Surgical History:  Procedure Laterality Date   BIV ICD GENERATOR CHANGEOUT N/A 05/04/2017   Procedure: BiV ICD Generator Changeout;  Surgeon: Marinus Maw, MD;  Location: [facility] INVASIVE CV LAB;  Service: Cardiovascular;  Laterality: N/A;   CARDIAC CATHETERIZATION  04/24/2002   CARDIAC VALVE REPLACEMENT     CARDIOVERSION  03/09/2012   Procedure: CARDIOVERSION;  Surgeon: Marinus Maw, MD;  Location: [facility] ENDOSCOPY;  Service: Cardiovascular;  Laterality: N/A;   CARDIOVERSION N/A 11/22/2013   Procedure: CARDIOVERSION;  Surgeon: Thurmon Fair, MD;  Location: [facility] ENDOSCOPY;  Service: Cardiovascular;  Laterality: N/A;   dental implants     ELECTROPHYSIOLOGIC STUDY N/A 09/15/2015   Procedure: V Tach Ablation;  Surgeon: Marinus Maw, MD;  Location:  [facility] INVASIVE CV LAB;  Service: Cardiovascular;  Laterality: N/A;   Evacution of epidural lumbar epidural abscess  1999   INSERT / REPLACE / REMOVE PACEMAKER  11/2008   MITRAL VALVE REPLACEMENT     w #[id number] [facility]   PERMANENT PACEMAKER INSERTION N/A 08/05/2011   Procedure: PERMANENT PACEMAKER INSERTION;  Surgeon: Marinus Maw, MD;  Location: [facility] CATH LAB;  Service: Cardiovascular;  Laterality: N/A;   RIGHT HEART CATH  N/A 02/26/2021   Procedure: RIGHT HEART CATH;  Surgeon: Laurey Morale, MD;  Location: [facility] INVASIVE CV LAB;  Service: Cardiovascular;  Laterality: N/A;   THYROIDECTOMY     TONSILLECTOMY     VALVE REPLACEMENT  2000    Social History  reports that he has never smoked. He has never used smokeless tobacco. He reports that he does not currently use alcohol. He reports that he does not use drugs.  No Known Allergies  Family History  Problem Relation Age of Onset   Heart disease Mother    Heart failure Mother    Heart disease Father    Heart failure Father    Sleep apnea Neg Hx   Reviewed on admission  Prior to Admission medications   Medication Sig Start Date End Date Taking? Authorizing Provider  acetaminophen (TYLENOL) 500 MG tablet Take 1,000 mg by mouth every 6 (six) hours as needed for mild pain.   Yes [provider]  amiodarone (PACERONE) 200 MG tablet Take 1 tablet (200 mg total) by mouth daily. Do NOT take Amiodarone on Wednesday or Sunday. 03/18/23  Yes Laurey Morale, MD  amoxicillin (AMOXIL) 500 MG capsule TAKE 4 CAPSULES BY MOUTH 30 MINUTES PRIOR TO DENTAL WORK 02/19/21  Yes Marinus Maw, MD  furosemide (LASIX) 20 MG tablet Take 3 tablets (60 mg total) by mouth 2 (two) times daily. 10/29/22  Yes Milford, Anderson Malta, FNP  HYDROcodone-acetaminophen (NORCO) 5-325 MG tablet Take 1 tablet by mouth every 6 (six) hours as needed for moderate pain. 03/17/23  Yes Schuh, McKenzi P, PA-C  levothyroxine (SYNTHROID) 88 MCG tablet TAKE 1 TABLET BY MOUTH EVERY DAY 03/23/23  Yes [name], MD  melatonin 5 MG TABS Take 5 mg by mouth at bedtime.   Yes [provider]  metoprolol succinate (TOPROL-XL) 25 MG 24 hr tablet Take 0.5 tablets (12.5 mg total) by mouth daily. 01/19/23  Yes Laurey Morale, MD  Multiple Vitamin (MULTIVITAMIN WITH MINERALS) TABS tablet Take 1 tablet by mouth in the morning.   Yes [provider]  mupirocin ointment (BACTROBAN) 2 % Apply 1  Application topically daily. 03/01/23  Yes McDonald, Rachelle Hora, DPM  nitroGLYCERIN (NITROSTAT) 0.4 MG SL tablet Place 1 tablet (0.4 mg total) under the tongue every 5 (five) minutes x 3 doses as needed for chest pain. 10/14/18  Yes Kilroy, Luke K, PA-C  quiniDINE gluconate 324 MG CR tablet TAKE 1 TABLET BY MOUTH 2 TIMES DAILY. 01/25/23  Yes Laurey Morale, MD  spironolactone (ALDACTONE) 25 MG tablet TAKE 1/2 TABLET BY MOUTH EVERY DAY 12/03/22  Yes Laurey Morale, MD  valsartan (DIOVAN) 40 MG tablet Take 40 mg by mouth daily.   Yes [provider]  warfarin (COUMADIN) 5 MG tablet TAKE AS DIRECTED BY THE COUMADIN CLINIC Patient taking differently: Take 2.5-5 mg by mouth See admin instructions. Take 5 mg by mouth on Mon., Wed., and Friday in the evening, all the other days take 2.5 mg by mouth in the evening  TAKE AS DIRECTED BY THE COUMADIN CLINIC 09/06/22  Yes Laurey Morale, MD  zinc gluconate 50 MG tablet Take 1 tablet (50 mg total) by mouth daily. 05/25/22  Yes [name], MD  enoxaparin (LOVENOX) 80 MG/0.8ML injection Inject 0.8 mLs (80 mg total) into the skin every 12 (twelve) hours. Patient not taking: Reported on 03/23/2023 03/11/23   Marinus Maw, MD    Physical Exam: Vitals:   03/23/23 1030 03/23/23 1045 03/23/23 1100 03/23/23 1207  BP: 93/61 96/63 110/72 121/75  Pulse: (!) 59 61 (!) 59 62  Resp: 14 15 16 19   Temp:    (!) 97.3 F (36.3 C)  TempSrc:    Oral  SpO2: (!) 86% 95% 98% 100%  Weight:      Height:        Physical Exam Constitutional:      General: He is not in acute distress.    Appearance: Normal appearance.  HENT:     Head: Normocephalic and atraumatic.     Mouth/Throat:     Mouth: Mucous membranes are moist.     Pharynx: Oropharynx is clear.  Eyes:     Extraocular Movements: Extraocular movements intact.     Pupils: Pupils are equal, round, and reactive to light.  Cardiovascular:     Rate and Rhythm: Normal rate and regular rhythm.     Pulses:  Normal pulses.     Heart sounds: Normal heart sounds.  Pulmonary:     Effort: Pulmonary effort is normal. No respiratory distress.     Breath sounds: Normal breath sounds.  Abdominal:     General: Bowel sounds are normal. There is no distension.     Palpations: Abdomen is soft.     Tenderness: There is no abdominal tenderness.  Musculoskeletal:        General: Swelling present.     Comments: Left chest wall fluid collection  Skin:    General: Skin is warm and dry.  Neurological:     General: No focal deficit present.     Mental Status: Mental status is at baseline.    Labs on Admission: I have personally reviewed following labs and imaging studies  CBC: Recent Labs  Lab 03/17/23 0753 03/23/23 1018 03/23/23 1223  WBC 6.4 9.6  --   NEUTROABS  --  7.8*  --   HGB 11.5* 9.3* 8.7*  HCT 36.1* 28.4* 27.4*  MCV 96.5 94.7  --   PLT 117* 151  --     Basic Metabolic Panel: Recent Labs  Lab 03/17/23 0753 03/23/23 1018  NA 135 135  K 3.6 4.4  CL 101 97*  CO2 24 28  GLUCOSE 91 131*  BUN 46* 48*  CREATININE 2.49* 2.44*  CALCIUM 8.5* 8.4*    GFR: Estimated Creatinine Clearance: 27.3 mL/min (A) (by C-G formula based on SCr of 2.44 mg/dL (H)).  Liver Function Tests: Recent Labs  Lab 03/17/23 0753  AST 38  ALT 28  ALKPHOS 79  BILITOT 1.0  PROT 6.9  ALBUMIN 3.7    Urine analysis:    Component Value Date/Time   COLORURINE YELLOW 03/17/2023 0755   APPEARANCEUR CLEAR 03/17/2023 0755   LABSPEC 1.013 03/17/2023 0755   PHURINE 5.0 03/17/2023 0755   GLUCOSEU NEGATIVE 03/17/2023 0755   HGBUR NEGATIVE 03/17/2023 0755   BILIRUBINUR NEGATIVE 03/17/2023 0755   KETONESUR NEGATIVE 03/17/2023 0755   PROTEINUR NEGATIVE 03/17/2023 0755   UROBILINOGEN 0.2 08/14/2021 1612   NITRITE NEGATIVE 03/17/2023 0755  LEUKOCYTESUR NEGATIVE 03/17/2023 0755    Radiological Exams on Admission: DG Chest Portable 1 View  Result Date: 03/23/2023 CLINICAL DATA:  Severe left-sided chest  pain and swelling over the past 2 days. Recent left chest wall stimulator placement 6 days ago. Hypotension. EXAM: PORTABLE CHEST 1 VIEW COMPARISON:  Chest x-ray dated July 05, 2022. FINDINGS: New stimulator implant in the left chest wall with single lead terminating in the region of the left carotid artery. There is prominent asymmetric soft tissue density surrounding the stimulator in the left chest wall. Unchanged right chest wall AICD. Stable cardiomegaly status post mitral valve replacement. Low lung volumes are present, causing crowding of the pulmonary vasculature. No focal consolidation, pleural effusion, or pneumothorax. No acute osseous abnormality. IMPRESSION: 1. New stimulator implant in the left chest wall with prominent asymmetric soft tissue density surrounding the stimulator in the left chest wall, concerning for hematoma. 2. No acute cardiopulmonary disease. Electronically Signed   By: Obie Dredge M.D.   On: 03/23/2023 10:28    EKG: Not performed in emergency department.  Assessment/Plan Principal Problem:   Fluid collection at surgical site, initial encounter Active Problems:   Atrial fibrillation (HCC)   Chronic systolic heart failure (HCC)   ICD (implantable cardioverter-defibrillator) in place   H/O mitral valve replacement with mechanical valve   S/P ablation of ventricular arrhythmia   VT (ventricular tachycardia) (HCC)   Hypothyroidism due to medication   Stage 3b chronic kidney disease (HCC)   Callous ulcer, limited to breakdown of skin (HCC)   OSA on CPAP   Anemia Fluid collection at surgical site, suspected hematoma > Patient presenting with left upper chest wall swelling and pain with fluid collection suspicious for hematoma on imaging. > Underwent Verastem device placement on 8/20.  This swelling and pain has been worsening over the last several days.  Is on warfarin for mechanical mitral valve.  Hemoglobin has decreased from 11.5-9.3 in the last 6 days. >  Vascular surgery consulted and plan for evacuation of hematoma once warfarin has washed out, bridged with heparin. - Monitor on progressive unit - Appreciate vascular surgery recommendations and assistance - Trend hemoglobin overnight, type and screen - Heparin has been ordered per pharmacy to assist with patient bridging off of warfarin  Atrial fibrillation - Continue home amiodarone, metoprolol  History of V. Tach > Status post ablation - Continue home quinidine  History of complete heart block > Status post pacemaker however only right ventricle lead is functioning currently per chart review. - Noted  Chronic systolic CHF > Follows with advanced heart failure clinic.  Last echo earlier this year and so EF 20-25%, indeterminate diastolic function, mildly reduced RV function. > Is status post AICD/pacemaker > Status post Verastem device placement as above with complication as above - Holding valsartan, spironolactone, Lasix given low blood pressure and some transient hypotension in the ED in the setting of above. - Continue metoprolol   History of mitral valve regurgitation > Status post mechanical mitral valve replacement - Holding warfarin and bridging with heparin as above - Continue to monitor  OSA - Continue home CPAP  CKD 3B > Creatinine stable at 2.44 in the ED. - Trend renal function and electrolytes  Hypothyroidism - Continue home Synthroid  Foot callus > Follows with podiatry, was due for MRI but this will have to be rescheduled.  DVT prophylaxis: Heparin Code Status:   Full Family Communication:  None on admission  Disposition Plan:   Patient is from:  Home  Anticipated DC to:  Home  Anticipated DC date:  2 to 4 days  Anticipated DC barriers: None  Consults called:  Vascular surgery Admission status:  Inpatient, Progressive  Severity of Illness: The appropriate patient status for this patient is INPATIENT. Inpatient status is judged to be reasonable  and necessary in order to provide the required intensity of service to ensure the patient's safety. The patient's presenting symptoms, physical exam findings, and initial radiographic and laboratory data in the context of their chronic comorbidities is felt to place them at high risk for further clinical deterioration. Furthermore, it is not anticipated that the patient will be medically stable for discharge from the hospital within 2 midnights of admission.   * I certify that at the point of admission it is my clinical judgment that the patient will require inpatient hospital care spanning beyond 2 midnights from the point of admission due to high intensity of service, high risk for further deterioration and high frequency of surveillance required.Synetta Fail MD [facility]  How to contact the [facility] Attending or Consulting provider 7A - 7P or covering provider during after hours 7P -7A, for this patient?   Check the care team in [facility] and look for a) attending/consulting [facility] provider listed and b) the [facility] team listed Log into [email or web address] and use [facility] universal password to access. If you do not have the password, please contact the hospital operator. Locate the [facility] provider you are looking for under [facility] and page to a number that you can be directly reached. If you still have difficulty reaching the provider, please page the [facility] (Director on Call) for the Hospitalists listed on amion for assistance.  03/23/2023, 3:06 PM

## 2023-03-23 NOTE — Telephone Encounter (Signed)
Caller: Patient  Concern: Stated that the swelling had increased to the point of making him "feel as though I'm going to pass out"  Procedure:  Barostim  Resolution: Instructed patient to proceed to nearest ER, pt has driver and is going to [facility] ED within the next 30 min. Instructed him to call 911 if he needed a ride or symptoms increased in severity.  Next Appt: Appointment scheduled for 8/28, may need to be canceled depending on ED visit

## 2023-03-23 NOTE — ED Notes (Signed)
Rreport called to Aundra Millet, RN, CN at [facility] E.D. We are awaiting the arrival of [facility].

## 2023-03-23 NOTE — ED Notes (Signed)
At this time, he leaves our facility via [facility] without incident. His wife and her friend are aware of his destination.

## 2023-03-23 NOTE — ED Notes (Signed)
Thomas at [facility] will send transport for [facility] ED to [facility] ED. Dr Melene Plan is accepting.-ABB(NS)

## 2023-03-24 ENCOUNTER — Other Ambulatory Visit: Payer: Self-pay

## 2023-03-24 ENCOUNTER — Ambulatory Visit: Payer: Medicare Other | Admitting: Podiatry

## 2023-03-24 DIAGNOSIS — T888XXA Other specified complications of surgical and medical care, not elsewhere classified, initial encounter: Secondary | ICD-10-CM | POA: Diagnosis not present

## 2023-03-24 DIAGNOSIS — Z9289 Personal history of other medical treatment: Secondary | ICD-10-CM

## 2023-03-24 HISTORY — DX: Personal history of other medical treatment: Z92.89

## 2023-03-24 LAB — COMPREHENSIVE METABOLIC PANEL
ALT: 20 U/L (ref 0–44)
AST: 25 U/L (ref 15–41)
Albumin: 2.9 g/dL — ABNORMAL LOW (ref 3.5–5.0)
Alkaline Phosphatase: 63 U/L (ref 38–126)
Anion gap: 13 (ref 5–15)
BUN: 45 mg/dL — ABNORMAL HIGH (ref 8–23)
CO2: 22 mmol/L (ref 22–32)
Calcium: 8.2 mg/dL — ABNORMAL LOW (ref 8.9–10.3)
Chloride: 99 mmol/L (ref 98–111)
Creatinine, Ser: 2.42 mg/dL — ABNORMAL HIGH (ref 0.61–1.24)
GFR, Estimated: 26 mL/min — ABNORMAL LOW (ref >60)
Glucose, Bld: 109 mg/dL — ABNORMAL HIGH (ref 70–99)
Potassium: 4.8 mmol/L (ref 3.5–5.1)
Sodium: 134 mmol/L — ABNORMAL LOW (ref 135–145)
Total Bilirubin: 0.6 mg/dL (ref 0.3–1.2)
Total Protein: 5.2 g/dL — ABNORMAL LOW (ref 6.5–8.1)

## 2023-03-24 LAB — IRON AND TIBC
Iron: 60 ug/dL (ref 45–182)
Saturation Ratios: 23 % (ref 17.9–39.5)
TIBC: 266 ug/dL (ref 250–450)
UIBC: 206 ug/dL

## 2023-03-24 LAB — PREPARE RBC (CROSSMATCH)

## 2023-03-24 LAB — PROTIME-INR
INR: 2.2 — ABNORMAL HIGH (ref 0.8–1.2)
Prothrombin Time: 25.1 seconds — ABNORMAL HIGH (ref 11.4–15.2)

## 2023-03-24 LAB — RETICULOCYTES
Immature Retic Fract: 35.6 % — ABNORMAL HIGH (ref 2.3–15.9)
RBC.: 2.38 MIL/uL — ABNORMAL LOW (ref 4.22–5.81)
Retic Count, Absolute: 107.3 10*3/uL (ref 19.0–186.0)
Retic Ct Pct: 4.5 % — ABNORMAL HIGH (ref 0.4–3.1)

## 2023-03-24 LAB — CBC
HCT: 24.7 % — ABNORMAL LOW (ref 39.0–52.0)
Hemoglobin: 7.9 g/dL — ABNORMAL LOW (ref 13.0–17.0)
MCH: 31.3 pg (ref 26.0–34.0)
MCHC: 32 g/dL (ref 30.0–36.0)
MCV: 98 fL (ref 80.0–100.0)
Platelets: 126 10*3/uL — ABNORMAL LOW (ref 150–400)
RBC: 2.52 MIL/uL — ABNORMAL LOW (ref 4.22–5.81)
RDW: 16.9 % — ABNORMAL HIGH (ref 11.5–15.5)
WBC: 11.2 10*3/uL — ABNORMAL HIGH (ref 4.0–10.5)
nRBC: 0.2 % (ref 0.0–0.2)

## 2023-03-24 LAB — HEPARIN LEVEL (UNFRACTIONATED)
Heparin Unfractionated: 0.6 [IU]/mL (ref 0.30–0.70)
Heparin Unfractionated: 1.04 [IU]/mL — ABNORMAL HIGH (ref 0.30–0.70)

## 2023-03-24 LAB — HEMOGLOBIN AND HEMATOCRIT, BLOOD
HCT: 22.6 % — ABNORMAL LOW (ref 39.0–52.0)
HCT: 25.9 % — ABNORMAL LOW (ref 39.0–52.0)
Hemoglobin: 7.2 g/dL — ABNORMAL LOW (ref 13.0–17.0)
Hemoglobin: 8.5 g/dL — ABNORMAL LOW (ref 13.0–17.0)

## 2023-03-24 LAB — VITAMIN B12: Vitamin B-12: 537 pg/mL (ref 180–914)

## 2023-03-24 LAB — FOLATE: Folate: 24.8 ng/mL (ref >5.9)

## 2023-03-24 LAB — FERRITIN: Ferritin: 144 ng/mL (ref 24–336)

## 2023-03-24 MED ORDER — HEPARIN (PORCINE) 25000 UT/250ML-% IV SOLN
1000.0000 [IU]/h | INTRAVENOUS | Status: DC
  Administered 2023-03-24: 1000 [IU]/h via INTRAVENOUS
  Filled 2023-03-24: qty 250

## 2023-03-24 MED ORDER — ACETAMINOPHEN 325 MG PO TABS
650.0000 mg | ORAL_TABLET | Freq: Four times a day (QID) | ORAL | Status: DC | PRN
  Administered 2023-03-24 – 2023-03-25 (×2): 650 mg via ORAL
  Filled 2023-03-24 (×2): qty 2

## 2023-03-24 MED ORDER — AMIODARONE HCL 200 MG PO TABS
200.0000 mg | ORAL_TABLET | ORAL | Status: DC
  Administered 2023-03-25 – 2023-04-01 (×5): 200 mg via ORAL
  Filled 2023-03-24 (×5): qty 1

## 2023-03-24 MED ORDER — ORAL CARE MOUTH RINSE
15.0000 mL | OROMUCOSAL | Status: DC | PRN
  Administered 2023-03-25: 15 mL via OROMUCOSAL

## 2023-03-24 MED ORDER — SODIUM CHLORIDE 0.9% IV SOLUTION: Freq: Once | INTRAVENOUS | Status: AC

## 2023-03-24 MED ORDER — SODIUM CHLORIDE 0.9 % IV BOLUS
500.0000 mL | Freq: Once | INTRAVENOUS | Status: AC
  Administered 2023-03-24: 500 mL via INTRAVENOUS

## 2023-03-24 NOTE — Progress Notes (Signed)
PROGRESS NOTE    Austin [name]  WUJ:[id number] DOB: Jun 12, 1941 DOA: 03/23/2023 PCP: [name], MD    Brief Narrative:   Austin [name] is a 82 y.o. 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  Progressive over the last 2 days with recent Barostim placement by vascular surgery on 03/17/2023 due to symptomatic advanced CHF.  Also endorses shortness of breath.  Denies fever, no chills, no abdominal pain, no nausea/vomiting/diarrhea.  In the ED, temperature 97.5 F, HR 61, RR 18, BP 82/62, SpO2 94% on room air.  WBC 9.6, hemoglobin 9.3, platelet count 151.  Sodium 135, potassium 4.4, chloride 97, CO2 28, glucose 131, BUN 48, creatinine 2.44.  BNP 352.5.  INR 2.0.  Chest x-ray with new stimulator implant left chest wall with prominent asymmetric soft tissue density surrounding the stimulator in the left chest wall concerning for hematoma, no acute cardiopulmonary disease process.  Vascular surgery was consulted.  Patient was given 500 mL IV fluid bolus, IV fentanyl/morphine, Zofran in the ED.  Warfarin was held and patient started on a heparin bridge given mechanical mitral valve.  TRH consulted for admission for further evaluation management of acute blood loss anemia secondary to hematoma surrounding recent surgical site  Assessment & Plan:   Acute blood loss anemia Left chest wall hematoma Patient presenting to ED with enlarging left chest region surrounding his surgical site associated with chest  discomfort and shortness of breath.  Recently underwent Barostim placement by vascular surgery, [name] on 03/17/2023 due to symptomatic events congestive heart failure.  Likely complicated by his need of chronic anticoagulation with Coumadin due to mechanical mitral valve -- Vascular surgery following, appreciate assistance -- Hgb 9.3>8.7>7.8>8.8>7.9>7.2 -- Check anemia panel -- Transfuse 1 unit PRBC, repeat H&H following transfusion -- Vascular surgery plans washout of chest wall hematoma tomorrow, n.p.o. after midnight -- H&H every 6 hours, transfuse for hemoglobin less than 8.0 given his underlying cardiac condition  Permanent atrial fibrillation/atypical flutter -- Continue amiodarone 200 mg daily on M/T/T/F/Sat -- Holding metoprolol given hypotension -- Monitor on telemetry  History of ventricular tachycardia -- Continue home quinidine, amiodarone  Hx complete heart block s/p PPM --Continue monitor on telemetry  Chronic systolic congestive heart failure, compensated Follows with advanced heart failure clinic, last TTE with LVEF 20-25%, indeterminate diastolic dysfunction, mildly reduced RV function.  S/p AICD/PPM.  Recently underwent Bariostim implantation on 8/22 by vascular surgery for symptomatic advanced heart failure. -- Holding home metoprolol, valsartan, spironolactone, Lasix given hypotension -- Continue monitor BP closely -- Strict I's and O's and daily weights  Hx MV endocarditis s/p mechanical mitral valve replacement On Coumadin outpatient. --Holding home Coumadin -- Continue heparin drip as bridge, pharmacy consulted for dosing/monitoring  CKD stage IIIb -- Cr 2.44>2.42; stable (baseline 2.2 - 2.7) -- Avoid nephrotoxins, renal dose all medications -- BMP daily  Hypothyroidism -- Continue levothyroxine  Foot callus Follows with podiatry, [name] outpatient.  Pending outpatient MRI.  OSA: --Continue nocturnal CPAP   DVT prophylaxis: Heparin  drip    Code Status: Full Code Family Communication: No family  present at bedside this morning  Disposition Plan:  Level of care: Progressive Status is: Inpatient Remains inpatient appropriate because: Pending surgical invention for left chest wall hematoma    Consultants:  Vascular surgery  Procedures:  None  Antimicrobials:  None   Subjective: Patient seen examined bedside, resting comfortably.  Lying in bed.  Patient reports feeling dizzy this morning.  BP low this morning 91/58, discontinued metoprolol and will give further IV fluid bolus.  Repeat hemoglobin this morning down to 7.2, was 9.3 on admission.  Discussed need of transfusion of 1 unit PRBC.  After receiving IV fluid bolus this morning his dizziness has resolved.  Seen by vascular surgery this morning with plan for evacuation of hematoma likely tomorrow but possibly later today if schedule warrants.No other specific complaints or concerns at this time other than wanting this procedure done as soon as possible.  Patient denies headache, no fever/chills/night sweats, no visual changes, no abdominal pain, no current shortness of breath, no focal weakness, no fatigue, no paresthesias.  No acute events overnight per nursing staff.    Objective: Vitals:   03/24/23 0746 03/24/23 0805 03/24/23 0915 03/24/23 0947  BP: (!) 101/46 (!) 99/44 (!) 93/55 (!) 83/60  Pulse:      Resp: 15 16 16    Temp: 97.7 F (36.5 C)     TempSrc: Oral     SpO2:      Weight:      Height:        Intake/Output Summary (Last 24 hours) at 03/24/2023 1120 Last data filed at 03/24/2023 0643 Gross per 24 hour  Intake 657.41 ml  Output 150 ml  Net 507.41 ml   Filed Weights   03/23/23 1003 03/23/23 2028 03/24/23 0432  Weight: 88.5 kg 89.9 kg 89.9 kg    Examination:  Physical Exam: GEN: NAD, alert and oriented x 3, wd/wn HEENT: NCAT, PERRL, EOMI, sclera clear, MMM PULM: CTAB w/o wheezes/crackles, normal respiratory effort, on room air CV:  RRR w/o M/G/R, large left chest wall hematoma surrounding surgical site GI: abd soft, NTND, NABS, no R/G/M MSK: no peripheral edema, muscle strength globally intact 5/5 bilateral upper/lower extremities NEURO: CN II-XII intact, no focal deficits, sensation to light touch intact PSYCH: normal mood/affect Integumentary: Left chest wall with a large presumed hematoma surrounding incision site with serosanguineous drainage, otherwise no other concerning rashes/lesions/wounds noted on exposed skin surfaces.      Data Reviewed: I have personally reviewed following labs and imaging studies  CBC: Recent Labs  Lab 03/23/23 1018 03/23/23 1223 03/23/23 1637 03/23/23 2201 03/24/23 0503 03/24/23 0920  WBC 9.6  --  8.9 11.9* 11.2*  --   NEUTROABS 7.8*  --   --   --   --   --   HGB 9.3* 8.7* 7.8* 8.8* 7.9* 7.2*  HCT 28.4* 27.4* 24.8* 27.8* 24.7* 22.6*  MCV 94.7  --  98.8 96.2 98.0  --   PLT 151  --  125* 137* 126*  --    Basic Metabolic Panel: Recent Labs  Lab 03/23/23 1018 03/23/23 2201 03/24/23 0503  NA 135  --  134*  K 4.4  --  4.8  CL 97*  --  99  CO2 28  --  22  GLUCOSE 131*  --  109*  BUN 48*  --  45*  CREATININE 2.44*  --  2.42*  CALCIUM 8.4*  --  8.2*  MG  --  2.4  --    GFR: Estimated Creatinine  Clearance: 27.5 mL/min (A) (by C-G formula based on SCr of 2.42 mg/dL (H)). Liver Function Tests: Recent Labs  Lab 03/24/23 0503  AST 25  ALT 20  ALKPHOS 63  BILITOT 0.6  PROT 5.2*  ALBUMIN 2.9*   No results for input(s): "LIPASE", "AMYLASE" in the last 168 hours. No results for input(s): "AMMONIA" in the last 168 hours. Coagulation Profile: Recent Labs  Lab 03/23/23 1223  INR 2.0*   Cardiac Enzymes: No results for input(s): "CKTOTAL", "CKMB", "CKMBINDEX", "TROPONINI" in the last 168 hours. BNP (last 3 results) Recent Labs    12/16/22 0959  PROBNP 3,692*   HbA1C: No results for input(s): "HGBA1C" in the last 72 hours. CBG: No results for input(s): "GLUCAP"  in the last 168 hours. Lipid Profile: No results for input(s): "CHOL", "HDL", "LDLCALC", "TRIG", "CHOLHDL", "LDLDIRECT" in the last 72 hours. Thyroid Function Tests: No results for input(s): "TSH", "T4TOTAL", "FREET4", "T3FREE", "THYROIDAB" in the last 72 hours. Anemia Panel: No results for input(s): "VITAMINB12", "FOLATE", "FERRITIN", "TIBC", "IRON", "RETICCTPCT" in the last 72 hours. Sepsis Labs: No results for input(s): "PROCALCITON", "LATICACIDVEN" in the last 168 hours.  No results found for this or any previous visit (from the past 240 hour(s)).       Radiology Studies: CUP PACEART REMOTE DEVICE CHECK  Result Date: 03/23/2023 Scheduled remote reviewed. Normal device function.  PAF, Burden 0.2%, longest 3 min 6 sec, on warfarin per Epic. Next remote 91 days. MC, CVRS.  DG Chest Portable 1 View  Result Date: 03/23/2023 CLINICAL DATA:  Severe left-sided chest pain and swelling over the past 2 days. Recent left chest wall stimulator placement 6 days ago. Hypotension. EXAM: PORTABLE CHEST 1 VIEW COMPARISON:  Chest x-ray dated July 05, 2022. FINDINGS: New stimulator implant in the left chest wall with single lead terminating in the region of the left carotid artery. There is prominent asymmetric soft tissue density surrounding the stimulator in the left chest wall. Unchanged right chest wall AICD. Stable cardiomegaly status post mitral valve replacement. Low lung volumes are present, causing crowding of the pulmonary vasculature. No focal consolidation, pleural effusion, or pneumothorax. No acute osseous abnormality. IMPRESSION: 1. New stimulator implant in the left chest wall with prominent asymmetric soft tissue density surrounding the stimulator in the left chest wall, concerning for hematoma. 2. No acute cardiopulmonary disease. Electronically Signed   By: Obie Dredge M.D.   On: 03/23/2023 10:28        Scheduled Meds:  sodium chloride   Intravenous Once   [START ON  03/25/2023] amiodarone  200 mg Oral Once per day on Monday Tuesday Thursday Friday Saturday   levothyroxine  88 mcg Oral Daily   melatonin  5 mg Oral QHS   quiniDINE gluconate  324 mg Oral BID   sodium chloride flush  3 mL Intravenous Q12H   Continuous Infusions:  heparin 1,000 Units/hr (03/24/23 0151)     LOS: 1 day    Time spent: 51 minutes spent on chart review, discussion with nursing staff, consultants, updating family and interview/physical exam; more than 50% of that time was spent in counseling and/or coordination of care.    Alvira Philips [place], DO [facility] Available via Epic secure chat 7am-7pm After these hours, please refer to coverage provider listed on [email or web address] 03/24/2023, 11:20 AM

## 2023-03-24 NOTE — Plan of Care (Signed)
  Problem: Health Behavior/Discharge Planning: Goal: Ability to manage health-related needs will improve Outcome: Progressing   Problem: Clinical Measurements: Goal: Diagnostic test results will improve Outcome: Progressing Goal: Respiratory complications will improve Outcome: Progressing Goal: Cardiovascular complication will be avoided Outcome: Progressing   Problem: Activity: Goal: Risk for activity intolerance will decrease Outcome: Progressing   Problem: Nutrition: Goal: Adequate nutrition will be maintained Outcome: Progressing   Problem: Pain Managment: Goal: General experience of comfort will improve Outcome: Progressing   Problem: Safety: Goal: Ability to remain free from injury will improve Outcome: Progressing   Problem: Skin Integrity: Goal: Risk for impaired skin integrity will decrease Outcome: Progressing

## 2023-03-24 NOTE — Progress Notes (Signed)
This RN paused heparin infusion at 1212, OK to hold per [place], DO for blood transfusion- see flowsheets and MAR.

## 2023-03-24 NOTE — Progress Notes (Addendum)
ANTICOAGULATION CONSULT NOTE   Pharmacy Consult for heparin  Indication:  afib with a mechanical MVR  No Known Allergies  Patient Measurements: Height: 6\' 1"  (185.4 cm) Weight: 89.9 kg (198 lb 3.2 oz) IBW/kg (Calculated) : 79.9 Heparin Dosing Weight: 88.5 kg  Vital Signs: Temp: 97.7 F (36.5 C) (08/29 0746) Temp Source: Oral (08/29 0746) BP: 83/60 (08/29 0947) Pulse Rate: 60 (08/29 0510)  Labs: Recent Labs    03/23/23 1018 03/23/23 1223 03/23/23 1637 03/23/23 2201 03/23/23 2347 03/24/23 0503 03/24/23 0920 03/24/23 1108  HGB 9.3* 8.7* 7.8* 8.8*  --  7.9* 7.2*  --   HCT 28.4* 27.4* 24.8* 27.8*  --  24.7* 22.6*  --   PLT 151  --  125* 137*  --  126*  --   --   LABPROT  --  22.7*  --   --   --   --   --   --   INR  --  2.0*  --   --   --   --   --   --   HEPARINUNFRC  --   --   --   --  1.04*  --   --  0.60  CREATININE 2.44*  --   --   --   --  2.42*  --   --     Estimated Creatinine Clearance: 27.5 mL/min (A) (by C-G formula based on SCr of 2.42 mg/dL (H)).   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   Conductive hearing loss    uses hearing aids   Hypothyroidism    Intraspinal abscess    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   Nonischemic cardiomyopathy (HCC)    Psychosexual dysfunction with inhibited sexual excitement    S/P mitral valve replacement    Syncope and collapse    Unspecified sleep apnea    last sleep study 11/07, uses cpap   Ventricular tachycardia (HCC)    VT (ventricular tachycardia) (HCC)     Medications:  Medications Prior to Admission  Medication Sig Dispense Refill Last Dose   acetaminophen  (TYLENOL) 500 MG tablet Take 1,000 mg by mouth every 6 (six) hours as needed for mild pain.   03/22/2023   amiodarone (PACERONE) 200 MG tablet Take 1 tablet (200 mg total) by mouth daily. Do NOT take Amiodarone on Wednesday or Sunday. 30 tablet 11 03/22/2023   amoxicillin (AMOXIL) 500 MG capsule TAKE 4 CAPSULES BY MOUTH 30 MINUTES PRIOR TO DENTAL WORK 4 capsule 2 unknown   furosemide (LASIX) 20 MG tablet Take 3 tablets (60 mg total) by mouth 2 (two) times daily. 180 tablet 8 03/22/2023   HYDROcodone-acetaminophen (NORCO) 5-325 MG tablet Take 1 tablet by mouth every 6 (six) hours as needed for moderate pain. 15 tablet 0 unknown   levothyroxine (SYNTHROID) 88 MCG tablet TAKE 1 TABLET BY MOUTH EVERY DAY 90 tablet 0 03/22/2023   melatonin 5 MG TABS Take 5 mg by mouth at bedtime.   03/22/2023   metoprolol succinate (TOPROL-XL) 25 MG 24 hr tablet Take 0.5 tablets (12.5 mg total) by mouth daily. 90 tablet 3 03/22/2023 at 2200   Multiple Vitamin (  MULTIVITAMIN WITH MINERALS) TABS tablet Take 1 tablet by mouth in the morning.   03/22/2023   mupirocin ointment (BACTROBAN) 2 % Apply 1 Application topically daily. 30 g 2 Past Month   nitroGLYCERIN (NITROSTAT) 0.4 MG SL tablet Place 1 tablet (0.4 mg total) under the tongue every 5 (five) minutes x 3 doses as needed for chest pain. 25 tablet 2 unknown   quiniDINE gluconate 324 MG CR tablet TAKE 1 TABLET BY MOUTH 2 TIMES DAILY. 180 tablet 3 03/22/2023   spironolactone (ALDACTONE) 25 MG tablet TAKE 1/2 TABLET BY MOUTH EVERY DAY 45 tablet 3 03/22/2023   valsartan (DIOVAN) 40 MG tablet Take 40 mg by mouth daily.   03/22/2023   warfarin (COUMADIN) 5 MG tablet TAKE AS DIRECTED BY THE COUMADIN CLINIC (Patient taking differently: Take 2.5-5 mg by mouth See admin instructions. Take 5 mg by mouth on Mon., Wed., and Friday in the evening, all the other days take 2.5 mg by mouth in the evening TAKE AS DIRECTED BY THE COUMADIN CLINIC) 80 tablet 1 03/22/2023 at 2200   zinc gluconate 50 MG  tablet Take 1 tablet (50 mg total) by mouth daily. 90 tablet 1 03/22/2023   enoxaparin (LOVENOX) 80 MG/0.8ML injection Inject 0.8 mLs (80 mg total) into the skin every 12 (twelve) hours. (Patient not taking: Reported on 03/23/2023) 16 mL 1 Not Taking   Scheduled:  Infusions:   Assessment: 82 yo male presenting with hematoma on warfarin PTA for afib with a history of mechanical MVR (goal INR 3-3.5). Home dose is 5 mg M/W/F and 2.5 mg all other days. Pt previously had a procedure and was started on lovenox and restarted his warfarin on Saturday, 8/24. Last dose of lovenox was 8/28 at 2100. Pharmacy has been consulted to dose heparin in anticipation of I and D.   -heparin level at goal on 1000 units/hr -hg= 7.2 (down trend); plans for PRBC  Goal of Therapy:  Heparin level 0.3-0.7 units/ml Monitor platelets by anticoagulation protocol: Yes   Plan:  -Continue heparin at 1000 units/hr -recheck heparin level in 6-8 hrs -plans noted for surgery on 8/30  Harland German, PharmD Clinical Pharmacist **Pharmacist phone directory can now be found on [email or web address] ([email or web address]).  Listed under [facility].  Addendum -heparin paused for blood administration  Plan -Will plan to recheck heparin level and CBC on 8/30  Harland German, PharmD Clinical Pharmacist **Pharmacist phone directory can now be found on [email or web address] ([email or web address]).  Listed under [facility].

## 2023-03-24 NOTE — Plan of Care (Signed)

## 2023-03-24 NOTE — Progress Notes (Addendum)
  Progress Note    03/24/2023 7:37 AM * No surgery date entered *  Subjective: L chest painful but patient believes it is not any worse   Vitals:   03/24/23 0432 03/24/23 0510  BP: (!) 91/58   Pulse: 60 60  Resp: 18   Temp: 97.7 F (36.5 C)   SpO2: 94% 93%   Physical Exam: Lungs:  non labored Incisions:  L neck incision healing well; L chest with serosanguinous drainage Extremities:  palpable DP BLE Abdomen:  soft Neurologic: A&O  CBC    Component Value Date/Time   WBC 11.2 (H) 03/24/2023 0503   RBC 2.52 (L) 03/24/2023 0503   HGB 7.9 (L) 03/24/2023 0503   HGB 16.6 12/04/2020 1445   HCT 24.7 (L) 03/24/2023 0503   HCT 50.1 12/04/2020 1445   PLT 126 (L) 03/24/2023 0503   PLT 158 12/04/2020 1445   MCV 98.0 03/24/2023 0503   MCV 94 12/04/2020 1445   MCH 31.3 03/24/2023 0503   MCHC 32.0 03/24/2023 0503   RDW 16.9 (H) 03/24/2023 0503   RDW 13.7 12/04/2020 1445   LYMPHSABS 0.4 (L) 03/23/2023 1018   MONOABS 1.0 03/23/2023 1018   EOSABS 0.1 03/23/2023 1018   BASOSABS 0.0 03/23/2023 1018    BMET    Component Value Date/Time   NA 134 (L) 03/24/2023 0503   NA 139 01/29/2021 0853   K 4.8 03/24/2023 0503   CL 99 03/24/2023 0503   CO2 22 03/24/2023 0503   GLUCOSE 109 (H) 03/24/2023 0503   BUN 45 (H) 03/24/2023 0503   BUN 18 01/29/2021 0853   CREATININE 2.42 (H) 03/24/2023 0503   CREATININE 1.22 (H) 09/12/2015 0846   CALCIUM 8.2 (L) 03/24/2023 0503   GFRNONAA 26 (L) 03/24/2023 0503   GFRAA 56 (L) 04/30/2020 1144    INR    Component Value Date/Time   INR 2.0 (H) 03/23/2023 1223     Intake/Output Summary (Last 24 hours) at 03/24/2023 0737 Last data filed at 03/24/2023 1027 Gross per 24 hour  Intake 657.41 ml  Output 150 ml  Net 507.41 ml     Assessment/Plan:  82 y.o. male is s/p barostim with post op hematoma   L chest hematoma, no obvious skin compromise Some serosanguinous drainage from chest incision; can apply dry dressing as needed Continue to  hold coumadin; hold heparin if hematoma worsens Plan is for washout tomorrow with [name]; consent ordered; npo past midnight   [name], PA-C [facility] [phone] 03/24/2023 7:37 AM  I agree with the above.  Plan surgery tomorrow.  If INR is low today, will try to add on today.  ONR pending  Durene Cal

## 2023-03-24 NOTE — Progress Notes (Addendum)
IV consult placed requesting 2nd IV site for blood transfusion. Pt is left arm restricted and currently has an IV in his right FA.  Right arm assessed using [place].  Message sent to MD recommending consideration of PICC or CVC given limited options and previous/multiple sticks. MD to order PICC line

## 2023-03-24 NOTE — Progress Notes (Signed)
ANTICOAGULATION CONSULT NOTE - Follow Up Consult  Pharmacy Consult for heparin Indication:  MVR/Afib  Labs: Recent Labs    03/23/23 1018 03/23/23 1223 03/23/23 1637 03/23/23 2201 03/23/23 2347  HGB 9.3* 8.7* 7.8* 8.8*  --   HCT 28.4* 27.4* 24.8* 27.8*  --   PLT 151  --  125* 137*  --   LABPROT  --  22.7*  --   --   --   INR  --  2.0*  --   --   --   HEPARINUNFRC  --   --   --   --  1.04*  CREATININE 2.44*  --   --   --   --     Assessment: 82yo male supratherapeutic on heparin with initial dosing while warfarin on hold; no infusion issues per RN but she does note that pt has been oozing at IV site (IV in [facility], pt reports discomfort when he moves his arm).  Goal of Therapy:  Heparin level 0.3-0.7 units/ml   Plan:  Hold heparin infusion x1h. Decrease heparin infusion by 3-4 units/kg/hr to 1000 units/hr. Check level in 8 hours.   Vernard Gambles, PharmD, BCPS 03/24/2023 12:40 AM

## 2023-03-24 NOTE — H&P (View-Only) (Signed)
  Progress Note    03/24/2023 7:37 AM * No surgery date entered *  Subjective: L chest painful but patient believes it is not any worse   Vitals:   03/24/23 0432 03/24/23 0510  BP: (!) 91/58   Pulse: 60 60  Resp: 18   Temp: 97.7 F (36.5 C)   SpO2: 94% 93%   Physical Exam: Lungs:  non labored Incisions:  L neck incision healing well; L chest with serosanguinous drainage Extremities:  palpable DP BLE Abdomen:  soft Neurologic: A&O  CBC    Component Value Date/Time   WBC 11.2 (H) 03/24/2023 0503   RBC 2.52 (L) 03/24/2023 0503   HGB 7.9 (L) 03/24/2023 0503   HGB 16.6 12/04/2020 1445   HCT 24.7 (L) 03/24/2023 0503   HCT 50.1 12/04/2020 1445   PLT 126 (L) 03/24/2023 0503   PLT 158 12/04/2020 1445   MCV 98.0 03/24/2023 0503   MCV 94 12/04/2020 1445   MCH 31.3 03/24/2023 0503   MCHC 32.0 03/24/2023 0503   RDW 16.9 (H) 03/24/2023 0503   RDW 13.7 12/04/2020 1445   LYMPHSABS 0.4 (L) 03/23/2023 1018   MONOABS 1.0 03/23/2023 1018   EOSABS 0.1 03/23/2023 1018   BASOSABS 0.0 03/23/2023 1018    BMET    Component Value Date/Time   NA 134 (L) 03/24/2023 0503   NA 139 01/29/2021 0853   K 4.8 03/24/2023 0503   CL 99 03/24/2023 0503   CO2 22 03/24/2023 0503   GLUCOSE 109 (H) 03/24/2023 0503   BUN 45 (H) 03/24/2023 0503   BUN 18 01/29/2021 0853   CREATININE 2.42 (H) 03/24/2023 0503   CREATININE 1.22 (H) 09/12/2015 0846   CALCIUM 8.2 (L) 03/24/2023 0503   GFRNONAA 26 (L) 03/24/2023 0503   GFRAA 56 (L) 04/30/2020 1144    INR    Component Value Date/Time   INR 2.0 (H) 03/23/2023 1223     Intake/Output Summary (Last 24 hours) at 03/24/2023 0737 Last data filed at 03/24/2023 1027 Gross per 24 hour  Intake 657.41 ml  Output 150 ml  Net 507.41 ml     Assessment/Plan:  82 y.o. male is s/p barostim with post op hematoma   L chest hematoma, no obvious skin compromise Some serosanguinous drainage from chest incision; can apply dry dressing as needed Continue to  hold coumadin; hold heparin if hematoma worsens Plan is for washout tomorrow with [name]; consent ordered; npo past midnight   [name], PA-C [facility] [phone] 03/24/2023 7:37 AM  I agree with the above.  Plan surgery tomorrow.  If INR is low today, will try to add on today.  ONR pending  Durene Cal

## 2023-03-25 ENCOUNTER — Encounter (HOSPITAL_COMMUNITY): Payer: Self-pay | Admitting: Internal Medicine

## 2023-03-25 ENCOUNTER — Inpatient Hospital Stay (HOSPITAL_COMMUNITY): Payer: Medicare Other | Admitting: Anesthesiology

## 2023-03-25 ENCOUNTER — Other Ambulatory Visit: Payer: Self-pay

## 2023-03-25 ENCOUNTER — Encounter (HOSPITAL_COMMUNITY)
Admission: EM | Disposition: A | Discharge status: Home Health | Payer: Self-pay | Source: Home / Self Care | Attending: Internal Medicine

## 2023-03-25 DIAGNOSIS — N1832 Chronic kidney disease, stage 3b: Secondary | ICD-10-CM | POA: Diagnosis not present

## 2023-03-25 DIAGNOSIS — S20219A Contusion of unspecified front wall of thorax, initial encounter: Secondary | ICD-10-CM | POA: Diagnosis not present

## 2023-03-25 DIAGNOSIS — I5022 Chronic systolic (congestive) heart failure: Secondary | ICD-10-CM

## 2023-03-25 DIAGNOSIS — I9789 Other postprocedural complications and disorders of the circulatory system, not elsewhere classified: Secondary | ICD-10-CM | POA: Diagnosis not present

## 2023-03-25 DIAGNOSIS — T888XXA Other specified complications of surgical and medical care, not elsewhere classified, initial encounter: Secondary | ICD-10-CM | POA: Diagnosis not present

## 2023-03-25 DIAGNOSIS — I130 Hypertensive heart and chronic kidney disease with heart failure and stage 1 through stage 4 chronic kidney disease, or unspecified chronic kidney disease: Secondary | ICD-10-CM | POA: Diagnosis not present

## 2023-03-25 HISTORY — PX: INCISION AND DRAINAGE OF WOUND: SHX1803

## 2023-03-25 LAB — CBC
HCT: 24.6 % — ABNORMAL LOW (ref 39.0–52.0)
Hemoglobin: 7.7 g/dL — ABNORMAL LOW (ref 13.0–17.0)
MCH: 29.6 pg (ref 26.0–34.0)
MCHC: 31.3 g/dL (ref 30.0–36.0)
MCV: 94.6 fL (ref 80.0–100.0)
Platelets: 138 10*3/uL — ABNORMAL LOW (ref 150–400)
RBC: 2.6 MIL/uL — ABNORMAL LOW (ref 4.22–5.81)
RDW: 18.9 % — ABNORMAL HIGH (ref 11.5–15.5)
WBC: 15.7 10*3/uL — ABNORMAL HIGH (ref 4.0–10.5)
nRBC: 0.5 % — ABNORMAL HIGH (ref 0.0–0.2)

## 2023-03-25 LAB — BASIC METABOLIC PANEL
Anion gap: 11 (ref 5–15)
BUN: 44 mg/dL — ABNORMAL HIGH (ref 8–23)
CO2: 25 mmol/L (ref 22–32)
Calcium: 8.3 mg/dL — ABNORMAL LOW (ref 8.9–10.3)
Chloride: 98 mmol/L (ref 98–111)
Creatinine, Ser: 2.47 mg/dL — ABNORMAL HIGH (ref 0.61–1.24)
GFR, Estimated: 26 mL/min — ABNORMAL LOW (ref >60)
Glucose, Bld: 116 mg/dL — ABNORMAL HIGH (ref 70–99)
Potassium: 4.9 mmol/L (ref 3.5–5.1)
Sodium: 134 mmol/L — ABNORMAL LOW (ref 135–145)

## 2023-03-25 LAB — PROTIME-INR
INR: 2.3 — ABNORMAL HIGH (ref 0.8–1.2)
Prothrombin Time: 25.8 seconds — ABNORMAL HIGH (ref 11.4–15.2)

## 2023-03-25 LAB — PREPARE RBC (CROSSMATCH): Order Confirmation: POSITIVE

## 2023-03-25 LAB — HEPARIN LEVEL (UNFRACTIONATED): Heparin Unfractionated: 0.47 [IU]/mL (ref 0.30–0.70)

## 2023-03-25 LAB — SURGICAL PCR SCREEN
MRSA, PCR: NEGATIVE
Staphylococcus aureus: NEGATIVE

## 2023-03-25 SURGERY — IRRIGATION AND DEBRIDEMENT WOUND
Anesthesia: General | Site: Chest | Laterality: Left

## 2023-03-25 MED ORDER — FENTANYL CITRATE (PF) 250 MCG/5ML IJ SOLN
INTRAMUSCULAR | Status: AC
  Filled 2023-03-25: qty 5

## 2023-03-25 MED ORDER — ONDANSETRON HCL 4 MG/2ML IJ SOLN
INTRAMUSCULAR | Status: DC | PRN
  Administered 2023-03-25: 4 mg via INTRAVENOUS

## 2023-03-25 MED ORDER — QUINIDINE GLUCONATE ER 324 MG PO TBCR
324.0000 mg | EXTENDED_RELEASE_TABLET | Freq: Two times a day (BID) | ORAL | Status: DC
  Administered 2023-03-25 – 2023-04-01 (×13): 324 mg via ORAL
  Filled 2023-03-25 (×19): qty 1

## 2023-03-25 MED ORDER — SUCCINYLCHOLINE CHLORIDE 200 MG/10ML IV SOSY
PREFILLED_SYRINGE | INTRAVENOUS | Status: AC
  Filled 2023-03-25: qty 10

## 2023-03-25 MED ORDER — OXYCODONE HCL 5 MG/5ML PO SOLN: 5.0000 mg | Freq: Once | ORAL | Status: DC | PRN

## 2023-03-25 MED ORDER — LACTATED RINGERS IV BOLUS
500.0000 mL | Freq: Once | INTRAVENOUS | Status: AC
  Administered 2023-03-25: 500 mL via INTRAVENOUS

## 2023-03-25 MED ORDER — ACETAMINOPHEN 500 MG PO TABS: 1000.0000 mg | ORAL_TABLET | Freq: Once | ORAL | Status: DC

## 2023-03-25 MED ORDER — DEXAMETHASONE SODIUM PHOSPHATE 10 MG/ML IJ SOLN
INTRAMUSCULAR | Status: DC | PRN
  Administered 2023-03-25: 4 mg via INTRAVENOUS

## 2023-03-25 MED ORDER — HYDROMORPHONE HCL 1 MG/ML IJ SOLN: 0.5000 mg | INTRAMUSCULAR | Status: DC | PRN

## 2023-03-25 MED ORDER — ROCURONIUM BROMIDE 10 MG/ML (PF) SYRINGE
PREFILLED_SYRINGE | INTRAVENOUS | Status: AC
  Filled 2023-03-25: qty 10

## 2023-03-25 MED ORDER — ETOMIDATE 2 MG/ML IV SOLN
INTRAVENOUS | Status: AC
  Filled 2023-03-25: qty 10

## 2023-03-25 MED ORDER — ONDANSETRON HCL 4 MG/2ML IJ SOLN
INTRAMUSCULAR | Status: AC
  Filled 2023-03-25: qty 2

## 2023-03-25 MED ORDER — ONDANSETRON HCL 4 MG/2ML IJ SOLN: 4.0000 mg | Freq: Once | INTRAMUSCULAR | Status: DC | PRN

## 2023-03-25 MED ORDER — PHENYLEPHRINE HCL-NACL 20-0.9 MG/250ML-% IV SOLN
INTRAVENOUS | Status: DC | PRN
  Administered 2023-03-25: 25 ug/min via INTRAVENOUS

## 2023-03-25 MED ORDER — 0.9 % SODIUM CHLORIDE (POUR BTL) OPTIME
TOPICAL | Status: DC | PRN
  Administered 2023-03-25: 1000 mL

## 2023-03-25 MED ORDER — LACTATED RINGERS IV BOLUS
500.0000 mL | Freq: Once | INTRAVENOUS | Status: DC
Start: 2023-03-25 — End: 2023-03-25

## 2023-03-25 MED ORDER — CEFAZOLIN SODIUM-DEXTROSE 2-3 GM-%(50ML) IV SOLR
INTRAVENOUS | Status: DC | PRN
Start: 2023-03-25 — End: 2023-03-25
  Administered 2023-03-25: 2 g via INTRAVENOUS

## 2023-03-25 MED ORDER — PROPOFOL 10 MG/ML IV BOLUS
INTRAVENOUS | Status: AC
  Filled 2023-03-25: qty 20

## 2023-03-25 MED ORDER — SODIUM CHLORIDE 0.9 % IR SOLN
Status: DC | PRN
  Administered 2023-03-25: 3000 mL

## 2023-03-25 MED ORDER — OXYCODONE HCL 5 MG PO TABS: 5.0000 mg | ORAL_TABLET | Freq: Once | ORAL | Status: DC | PRN

## 2023-03-25 MED ORDER — POLYETHYLENE GLYCOL 3350 17 G PO PACK
17.0000 g | PACK | Freq: Every day | ORAL | Status: DC
  Administered 2023-03-25 – 2023-04-01 (×7): 17 g via ORAL
  Filled 2023-03-25 (×8): qty 1

## 2023-03-25 MED ORDER — SODIUM CHLORIDE 0.9% IV SOLUTION: Freq: Once | INTRAVENOUS | Status: AC

## 2023-03-25 MED ORDER — CHLORHEXIDINE GLUCONATE 0.12 % MT SOLN: 15.0000 mL | Freq: Once | OROMUCOSAL | Status: AC

## 2023-03-25 MED ORDER — VANCOMYCIN HCL 500 MG IV SOLR
INTRAVENOUS | Status: DC | PRN
Start: 2023-03-25 — End: 2023-03-25
  Administered 2023-03-25: 500 mg

## 2023-03-25 MED ORDER — LIDOCAINE 2% (20 MG/ML) 5 ML SYRINGE
INTRAMUSCULAR | Status: AC
  Filled 2023-03-25: qty 5

## 2023-03-25 MED ORDER — GENTAMICIN SULFATE 40 MG/ML IJ SOLN
INTRAMUSCULAR | Status: DC | PRN
Start: 2023-03-25 — End: 2023-03-25
  Administered 2023-03-25: 120 mg

## 2023-03-25 MED ORDER — HYDROCODONE-ACETAMINOPHEN 7.5-325 MG PO TABS
1.0000 | ORAL_TABLET | ORAL | Status: DC | PRN
  Administered 2023-03-27: 1 via ORAL
  Administered 2023-03-28 – 2023-03-30 (×3): 2 via ORAL
  Administered 2023-03-31: 1 via ORAL
  Filled 2023-03-25: qty 1
  Filled 2023-03-25 (×4): qty 2
  Filled 2023-03-25: qty 1

## 2023-03-25 MED ORDER — PROPOFOL 10 MG/ML IV BOLUS
INTRAVENOUS | Status: DC | PRN
  Administered 2023-03-25: 60 mg via INTRAVENOUS

## 2023-03-25 MED ORDER — FENTANYL CITRATE (PF) 100 MCG/2ML IJ SOLN: 25.0000 ug | INTRAMUSCULAR | Status: DC | PRN

## 2023-03-25 MED ORDER — MIDAZOLAM HCL 2 MG/2ML IJ SOLN
INTRAMUSCULAR | Status: AC
  Filled 2023-03-25: qty 2

## 2023-03-25 MED ORDER — PHENYLEPHRINE 80 MCG/ML (10ML) SYRINGE FOR IV PUSH (FOR BLOOD PRESSURE SUPPORT)
PREFILLED_SYRINGE | INTRAVENOUS | Status: AC
  Filled 2023-03-25: qty 10

## 2023-03-25 MED ORDER — EPHEDRINE 5 MG/ML INJ
INTRAVENOUS | Status: AC
  Filled 2023-03-25: qty 5

## 2023-03-25 MED ORDER — ACETAMINOPHEN 500 MG PO TABS
ORAL_TABLET | ORAL | Status: AC
  Filled 2023-03-25: qty 2

## 2023-03-25 MED ORDER — ORAL CARE MOUTH RINSE: 15.0000 mL | Freq: Once | OROMUCOSAL | Status: AC

## 2023-03-25 MED ORDER — CHLORHEXIDINE GLUCONATE 0.12 % MT SOLN
OROMUCOSAL | Status: AC
  Administered 2023-03-25: 15 mL via OROMUCOSAL
  Filled 2023-03-25: qty 15

## 2023-03-25 MED ORDER — FENTANYL CITRATE (PF) 250 MCG/5ML IJ SOLN
INTRAMUSCULAR | Status: DC | PRN
  Administered 2023-03-25: 50 ug via INTRAVENOUS

## 2023-03-25 MED ORDER — VANCOMYCIN HCL 500 MG IV SOLR
INTRAVENOUS | Status: AC
  Filled 2023-03-25: qty 10

## 2023-03-25 MED ORDER — PHENYLEPHRINE 80 MCG/ML (10ML) SYRINGE FOR IV PUSH (FOR BLOOD PRESSURE SUPPORT)
PREFILLED_SYRINGE | INTRAVENOUS | Status: DC | PRN
  Administered 2023-03-25: 160 ug via INTRAVENOUS
  Administered 2023-03-25: 320 ug via INTRAVENOUS
  Administered 2023-03-25 (×2): 160 ug via INTRAVENOUS

## 2023-03-25 MED ORDER — EPHEDRINE SULFATE-NACL 50-0.9 MG/10ML-% IV SOSY
PREFILLED_SYRINGE | INTRAVENOUS | Status: DC | PRN
  Administered 2023-03-25 (×4): 12.5 mg via INTRAVENOUS

## 2023-03-25 MED ORDER — LIDOCAINE 2% (20 MG/ML) 5 ML SYRINGE
INTRAMUSCULAR | Status: DC | PRN
  Administered 2023-03-25: 60 mg via INTRAVENOUS

## 2023-03-25 MED ORDER — SODIUM CHLORIDE 0.9 % IV SOLN: INTRAVENOUS | Status: DC

## 2023-03-25 MED ORDER — GENTAMICIN SULFATE 40 MG/ML IJ SOLN
INTRAMUSCULAR | Status: AC
  Filled 2023-03-25: qty 4

## 2023-03-25 SURGICAL SUPPLY — 35 items
BAG COUNTER SPONGE SURGICOUNT (BAG) ×1 IMPLANT
BAG SPNG CNTER NS LX DISP (BAG) ×1
CANISTER SUCT 3000ML PPV (MISCELLANEOUS) ×1 IMPLANT
CLIP TI MEDIUM 6 (CLIP) ×1 IMPLANT
CLIP TI WIDE RED SMALL 6 (CLIP) ×1 IMPLANT
COVER SURGICAL LIGHT HANDLE (MISCELLANEOUS) ×1 IMPLANT
DRAPE INCISE IOBAN 66X45 STRL (DRAPES) IMPLANT
DRSG VAC GRANUFOAM SM (GAUZE/BANDAGES/DRESSINGS) IMPLANT
ELECT REM PT RETURN 9FT ADLT (ELECTROSURGICAL) ×1
ELECTRODE REM PT RTRN 9FT ADLT (ELECTROSURGICAL) ×1 IMPLANT
GAUZE SPONGE 4X4 12PLY STRL (GAUZE/BANDAGES/DRESSINGS) ×1 IMPLANT
GLOVE SURG SS PI 7.5 STRL IVOR (GLOVE) ×3 IMPLANT
GOWN STRL REUS W/ TWL LRG LVL3 (GOWN DISPOSABLE) ×2 IMPLANT
GOWN STRL REUS W/ TWL XL LVL3 (GOWN DISPOSABLE) ×1 IMPLANT
GOWN STRL REUS W/TWL LRG LVL3 (GOWN DISPOSABLE) ×2
GOWN STRL REUS W/TWL XL LVL3 (GOWN DISPOSABLE) ×1
GRAFT SKIN WND MICRO 38 (Tissue) IMPLANT
HANDLE SUCTION POOLE (INSTRUMENTS) IMPLANT
HANDPIECE INTERPULSE COAX TIP (DISPOSABLE) ×1
IV NS IRRIG 3000ML ARTHROMATIC (IV SOLUTION) ×1 IMPLANT
KIT BASIN OR (CUSTOM PROCEDURE TRAY) ×1 IMPLANT
KIT STIMULAN RAPID CURE 5CC (Orthopedic Implant) IMPLANT
KIT TURNOVER KIT B (KITS) ×1 IMPLANT
NS IRRIG 1000ML POUR BTL (IV SOLUTION) ×1 IMPLANT
PACK GENERAL/GYN (CUSTOM PROCEDURE TRAY) ×1 IMPLANT
PACK UNIVERSAL I (CUSTOM PROCEDURE TRAY) ×1 IMPLANT
PAD ARMBOARD 7.5X6 YLW CONV (MISCELLANEOUS) ×2 IMPLANT
SET HNDPC FAN SPRY TIP SCT (DISPOSABLE) IMPLANT
SUCTION POOLE HANDLE (INSTRUMENTS) ×1
SUT ETHIBOND X763 2 0 SH 1 (SUTURE) IMPLANT
SUT ETHILON 3 0 PS 1 (SUTURE) IMPLANT
SUT VIC AB 2-0 SH 27 (SUTURE) ×1
SUT VIC AB 2-0 SH 27XBRD (SUTURE) IMPLANT
TOWEL GREEN STERILE (TOWEL DISPOSABLE) ×1 IMPLANT
WATER STERILE IRR 1000ML POUR (IV SOLUTION) ×1 IMPLANT

## 2023-03-25 NOTE — Anesthesia Procedure Notes (Signed)
Procedure Name: LMA Insertion Date/Time: 03/25/2023 1:02 PM  Performed by: Darlina Guys, CRNAPre-anesthesia Checklist: Patient identified, Emergency Drugs available, Suction available and Patient being monitored Patient Re-evaluated:Patient Re-evaluated prior to induction Oxygen Delivery Method: Circle System Utilized Preoxygenation: Pre-oxygenation with 100% oxygen Induction Type: IV induction Ventilation: Mask ventilation without difficulty LMA: LMA inserted LMA Size: 4.0 Number of attempts: 1 Airway Equipment and Method: Bite block Placement Confirmation: positive ETCO2 Tube secured with: Tape Dental Injury: Teeth and Oropharynx as per pre-operative assessment

## 2023-03-25 NOTE — Progress Notes (Signed)
AFIB

## 2023-03-25 NOTE — Op Note (Signed)
    Patient name: Austin [name] MRN: [id number] DOB: May 20, 1941 Sex: male  03/25/2023 Pre-operative Diagnosis: Left chest wall hematoma Post-operative diagnosis:  Left chest wall seroma Surgeon:  Durene Cal Assistants:  Aggie Moats, PA Procedure:   #1:  irrigation and debridement of left chest wall   #2:  Placement of antibiotic impregnated beads   #3:  Placement of skin substitute, first 38 square cm   #4: Placement of wound VAC (3 x 1 x 2 cm) Anesthesia:  General Blood Loss:  minimal Specimens: None  Findings:  1L of clear / blood colored simple fluid under the muscle, surrounding the generator  Indications: This is a 82 year old gentleman who underwent placement of a left-sided Barostim device last week.  He is anticoagulated for a mechanical valve.  Over the weekend he developed significant swelling in his chest wall which brought him to the hospital.  We did not operate on him immediately because his INR was elevated.  He comes in today for repair.  His INR was still elevated however we gave him FFP.  Procedure:  The patient was identified in the holding area and taken to [facility] OR ROOM 01  The patient was then placed supine on the table. general anesthesia was administered.  The patient was prepped and draped in the usual sterile fashion.  A time out was called and antibiotics were administered.  An assistant was necessary to expedite the procedure.  He helped with suction and retraction as well as placement of the antibiotic beads and skin substitute, as well as application of the wound VAC.  I began by reopening the left chest wall incision.  I removed the sutures.  There was no fluid anterior to the pectoralis muscle.  I then bluntly separated the pectoralis muscle fibers and entered the cavity.  There was mostly clear-colored fluid that was drained.  This looks like a large seroma or lymphocele.  It was blood-tinged.  I did not see any evidence of infection.  I evacuated about 1  L of fluid.  I then used the Pulsavac and irrigated the wound with 3 L of saline.  I then inspected the area.  I did not see any obvious bleeding or any obvious source of the lymphatic leak I then placed vancomycin and gentamicin impregnated beads into the cavity as well as 38 cm of Kerecis for skin powder.  I reapproximated the muscle over top of the generator with interrupted 2-0 Vicryl.  I then placed a wound VAC in the subcutaneous tissue and connected it to suction.  The patient was successfully extubated and taken recovery stable condition.  There were no immediate complications.   Disposition: To PACU stable.   Juleen China, M.D., [facility] [place] Office: [phone] Pager:  [phone]

## 2023-03-25 NOTE — Anesthesia Preprocedure Evaluation (Addendum)
Anesthesia Evaluation  Patient identified by MRN, date of birth, ID band Patient awake    Reviewed: Allergy & Precautions, NPO status , Patient's Chart, lab work & pertinent test results  History of Anesthesia Complications Negative for: history of anesthetic complications  Airway Mallampati: III  TM Distance: >3 FB Neck ROM: Full    Dental  (+) Dental Advisory Given, Partial Upper   Pulmonary sleep apnea and Continuous Positive Airway Pressure Ventilation    Pulmonary exam normal        Cardiovascular hypertension, Pt. on medications and Pt. on home beta blockers +CHF  Normal cardiovascular exam+ dysrhythmias Atrial Fibrillation and Ventricular Tachycardia + Cardiac Defibrillator + Valvular Problems/Murmurs (s/p MVR)    '24 TTE - EF 20 to 25%. The left ventricle demonstrates global hypokinesis. The left ventricular internal cavity size was mildly to moderately dilated. Right ventricular systolic function is mildly reduced. There is mildly elevated pulmonary artery systolic pressure. The estimated right ventricular systolic pressure is 42.0 mmHg. Left atrial size was moderately dilated. Right atrial size was mildly dilated. Mechanical mitral valve with mean gradient 4, no significant mitral regurgitation noted. Function appears normal. Tricuspid valve regurgitation is moderate. There is mild dilatation of the aortic root, measuring 39 mm.     Neuro/Psych  Neuromuscular disease  negative psych ROS   GI/Hepatic negative GI ROS, Neg liver ROS,,,  Endo/Other  Hypothyroidism    Renal/GU CRFRenal disease     Musculoskeletal negative musculoskeletal ROS (+)    Abdominal   Peds  Hematology  (+) Blood dyscrasia, anemia  Plt 138k INR 2.3 On coumadin    Anesthesia Other Findings   Reproductive/Obstetrics                             Anesthesia Physical Anesthesia Plan  ASA: 4  Anesthesia Plan:  General   Post-op Pain Management: Minimal or no pain anticipated and Tylenol PO (pre-op)*   Induction: Intravenous  PONV Risk Score and Plan: 2 and Treatment may vary due to age or medical condition and Ondansetron  Airway Management Planned: LMA  Additional Equipment: None  Intra-op Plan:   Post-operative Plan: Extubation in OR  Informed Consent: I have reviewed the patients History and Physical, chart, labs and discussed the procedure including the risks, benefits and alternatives for the proposed anesthesia with the patient or authorized representative who has indicated his/her understanding and acceptance.     Dental advisory given  Plan Discussed with: CRNA and Anesthesiologist  Anesthesia Plan Comments:         Anesthesia Quick Evaluation

## 2023-03-25 NOTE — Progress Notes (Signed)
   03/25/23 0017  Assess: MEWS Score  BP (!) 80/54  MAP (mmHg) (!) 64  ECG Heart Rate 60  Resp 18  Level of Consciousness Alert  SpO2 96 %  O2 Device Room Air  Assess: MEWS Score  MEWS Temp 0  MEWS Systolic 2  MEWS Pulse 0  MEWS RR 0  MEWS LOC 0  MEWS Score 2  MEWS Score Color Yellow  Assess: if the MEWS score is Yellow or Red  Were vital signs accurate and taken at a resting state? Yes  Does the patient meet 2 or more of the SIRS criteria? No  MEWS guidelines implemented  Yes, yellow  Treat  MEWS Interventions Considered administering scheduled or prn medications/treatments as ordered  Take Vital Signs  Increase Vital Sign Frequency  Yellow: Q2hr x1, continue Q4hrs until patient remains green for 12hrs  Escalate  MEWS: Escalate Yellow: Discuss with charge nurse and consider notifying provider and/or RRT  Notify: Charge Nurse/RN  Name of Charge Nurse/RN Notified Annabelle Harman, RN  Provider Notification  Provider Name/Title Dr. Julian Reil, DO  Date Provider Notified 03/25/23  Time Provider Notified 1234  Method of Notification Page (Secured chat)  Notification Reason Other (Comment) (low BP)  Provider response See new orders  Date of Provider Response 03/25/23  Time of Provider Response 1235  Assess: SIRS CRITERIA  SIRS Temperature  0  SIRS Pulse 0  SIRS Respirations  0  SIRS WBC 0  SIRS Score Sum  0

## 2023-03-25 NOTE — Progress Notes (Signed)
   03/24/23 2029  BiPAP/CPAP/SIPAP  BiPAP/CPAP/SIPAP Pt Type Adult  BiPAP/CPAP/SIPAP Resmed  Reason BIPAP/CPAP not in use Non-compliant (patient refuse NIV for the night, states that nasal mask irritates his nose. machine is at bedside in case changes his mind.)  Mask Type Nasal mask  Patient Home Equipment No  BiPAP/CPAP /SiPAP Vitals  Pulse Rate 60  Resp 16  SpO2 98 %   Jaunice Mirza L. Katrinka Blazing, BS, RRT-ACCS, RCP

## 2023-03-25 NOTE — Transfer of Care (Signed)
Immediate Anesthesia Transfer of Care Note  Patient: Austin [name]  Procedure(s) Performed: Washout of Chest Wall Hematoma (Left: Chest)  Patient Location: PACU  Anesthesia Type:General  Level of Consciousness: awake and oriented  Airway & Oxygen Therapy: Patient Spontanous Breathing and Patient connected to nasal cannula oxygen  Post-op Assessment: Report given to RN and Post -op Vital signs reviewed and stable  Post vital signs: Reviewed and stable  Last Vitals:  Vitals Value Taken Time  BP 113/47 03/25/23 1415  Temp    Pulse 66 03/25/23 1418  Resp 11 03/25/23 1418  SpO2 88 % 03/25/23 1418  Vitals shown include unfiled device data.  Last Pain:  Vitals:   03/25/23 1229  TempSrc: Oral  PainSc:       Patients Stated Pain Goal: 0 (03/25/23 0602)  Complications: No notable events documented.

## 2023-03-25 NOTE — Interval H&P Note (Signed)
History and Physical Interval Note:  03/25/2023 11:55 AM  Austin [name]  has presented today for surgery, with the diagnosis of Chest Wall Hematoma.  The various methods of treatment have been discussed with the patient and family. After consideration of risks, benefits and other options for treatment, the patient has consented to  Procedure(s): Washout of Chest Wall Hematoma (N/A) as a surgical intervention.  The patient's history has been reviewed, patient examined, no change in status, stable for surgery.  I have reviewed the patient's chart and labs.  Questions were answered to the patient's satisfaction.     Durene Cal

## 2023-03-25 NOTE — Progress Notes (Signed)
PROGRESS NOTE    Austin [name]  ZOX:[id number] DOB: 1940-12-13 DOA: 03/23/2023 PCP: [name], MD    Brief Narrative:   Austin [name] is a 82 y.o. 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 ED on 8/24 with severe swelling and pain to his left chest surrounding his surgical site.  Progressive over the last 2 days with recent Barostim placement by vascular surgery on 03/17/2023 due to symptomatic advanced CHF.  Also endorses shortness of breath.  Denies fever, no chills, no abdominal pain, no nausea/vomiting/diarrhea.  In the ED, temperature 97.5 F, HR 61, RR 18, BP 82/62, SpO2 94% on room air.  WBC 9.6, hemoglobin 9.3, platelet count 151.  Sodium 135, potassium 4.4, chloride 97, CO2 28, glucose 131, BUN 48, creatinine 2.44.  BNP 352.5.  INR 2.0.  Chest x-ray with new stimulator implant left chest wall with prominent asymmetric soft tissue density surrounding the stimulator in the left chest wall concerning for hematoma, no acute cardiopulmonary disease process.  Vascular surgery was consulted.  Patient was given 500 mL IV fluid bolus, IV fentanyl/morphine, Zofran in the ED.  Warfarin was held and patient started on a heparin bridge given mechanical mitral valve.  TRH consulted for admission for further evaluation management of acute blood loss anemia secondary to hematoma surrounding recent surgical site  Assessment & Plan:   Acute blood loss anemia Left chest wall hematoma Patient presenting to ED with enlarging left chest region surrounding his surgical site associated with chest  discomfort and shortness of breath.  Recently underwent Barostim placement by vascular surgery, [name] on 03/17/2023 due to symptomatic events congestive heart failure.  Likely complicated by his need of chronic anticoagulation with Coumadin due to mechanical mitral valve.  Anemia panel with iron 60, TIBC 266, ferritin 144, folate 24.8, vitamin B12 537. -- Vascular surgery following, appreciate assistance -- Hgb 9.3>8.7>7.8>8.8>7.9>7.2>8.5>7.7 -- Transfuse 1 unit pRBC 8/29; repeat 1 u pRBC today, repeat H&H following transfusion -- Vascular surgery plans washout of chest wall hematoma today, n.p.o. -- H&H every 6 hours, transfuse for hemoglobin less than 8.0 given his underlying cardiac condition  Permanent atrial fibrillation/atypical flutter -- Continue amiodarone 200 mg daily on M/T/T/F/Sat -- Holding metoprolol given hypotension -- Monitor on telemetry  History of ventricular tachycardia -- Continue home quinidine, amiodarone  Hx complete heart block s/p PPM -- Continue monitor on telemetry  Chronic systolic congestive heart failure, compensated Follows with advanced heart failure clinic, last TTE with LVEF 20-25%, indeterminate diastolic dysfunction, mildly reduced RV function.  S/p AICD/PPM.  Recently underwent Bariostim implantation on 8/22 by vascular surgery for symptomatic advanced heart failure. -- Holding home metoprolol, valsartan, spironolactone, Lasix given hypotension -- Continue monitor BP closely -- Strict I's and O's and daily weights  Hx MV endocarditis s/p mechanical mitral valve replacement On Coumadin outpatient. --Holding home Coumadin -- Continue heparin drip as bridge, pharmacy consulted for dosing/monitoring  CKD stage IIIb -- Cr 2.44>2.42>2.47; stable (baseline 2.2 - 2.7) -- Avoid nephrotoxins, renal dose all medications -- BMP daily  Hypothyroidism -- Continue levothyroxine  Foot callus Follows with podiatry, [name] outpatient.  Pending  outpatient MRI.  Outpatient follow-up with podiatry/vascular surgery.  OSA: --Continue nocturnal CPAP   DVT prophylaxis: Heparin drip    Code Status: Full Code Family Communication: No family present at bedside this morning  Disposition Plan:  Level of care: Progressive Status is: Inpatient Remains inpatient appropriate because: Pending surgical invention for left chest wall hematoma this afternoon    Consultants:  Vascular surgery  Procedures:  None  Antimicrobials:  None   Subjective: Patient seen examined bedside, resting comfortably.  Lying in bed.  Patient reports feeling dizzy this morning.  BP low this morning 91/58, discontinued metoprolol and will give further IV fluid bolus.  Repeat hemoglobin this morning down to 7.2, was 9.3 on admission.  Discussed need of transfusion of 1 unit PRBC.  After receiving IV fluid bolus this morning his dizziness has resolved.  Seen by vascular surgery this morning with plan for evacuation of hematoma likely tomorrow but possibly later today if schedule warrants.No other specific complaints or concerns at this time other than wanting this procedure done as soon as possible.  Patient denies headache, no fever/chills/night sweats, no visual changes, no abdominal pain, no current shortness of breath, no focal weakness, no fatigue, no paresthesias.  No acute events overnight per nursing staff.    Objective: Vitals:   03/25/23 0830 03/25/23 0915 03/25/23 1015 03/25/23 1103  BP: (!) 95/52 106/61 106/60 (!) 105/59  Pulse:      Resp:  20 20 20   Temp: 97.7 F (36.5 C) 97.8 F (36.6 C) 98.3 F (36.8 C) 97.7 F (36.5 C)  TempSrc: Oral Oral Oral Oral  SpO2:      Weight:      Height:        Intake/Output Summary (Last 24 hours) at 03/25/2023 1108 Last data filed at 03/25/2023 1103 Gross per 24 hour  Intake 864.83 ml  Output 900 ml  Net -35.17 ml   Filed Weights   03/23/23 2028 03/24/23 0432 03/25/23 0305  Weight: 89.9 kg 89.9 kg 91.3  kg    Examination:  Physical Exam: GEN: NAD, alert and oriented x 3, wd/wn HEENT: NCAT, PERRL, EOMI, sclera clear, MMM PULM: CTAB w/o wheezes/crackles, normal respiratory effort, on room air CV: RRR w/o M/G/R, large left chest wall hematoma surrounding surgical site GI: abd soft, NTND, NABS, no R/G/M MSK: no peripheral edema, muscle strength globally intact 5/5 bilateral upper/lower extremities NEURO: CN II-XII intact, no focal deficits, sensation to light touch intact PSYCH: normal mood/affect Integumentary: Left chest wall with a large presumed hematoma surrounding incision site with serosanguineous drainage, right lateral foot callus as depicted below, otherwise no other concerning rashes/lesions/wounds noted on exposed skin surfaces.           Data Reviewed: I have personally reviewed following labs and imaging studies  CBC: Recent Labs  Lab 03/23/23 1018 03/23/23 1223 03/23/23 1637 03/23/23 2201 03/24/23 0503 03/24/23 0920 03/24/23 1648 03/25/23 0351  WBC 9.6  --  8.9 11.9* 11.2*  --   --  15.7*  NEUTROABS 7.8*  --   --   --   --   --   --   --   HGB 9.3*   < > 7.8* 8.8* 7.9* 7.2* 8.5* 7.7*  HCT 28.4*   < > 24.8* 27.8* 24.7* 22.6* 25.9* 24.6*  MCV 94.7  --  98.8 96.2 98.0  --   --  94.6  PLT 151  --  125* 137* 126*  --   --  138*   < > = values in this interval not displayed.  Basic Metabolic Panel: Recent Labs  Lab 03/23/23 1018 03/23/23 2201 03/24/23 0503 03/25/23 0351  NA 135  --  134* 134*  K 4.4  --  4.8 4.9  CL 97*  --  99 98  CO2 28  --  22 25  GLUCOSE 131*  --  109* 116*  BUN 48*  --  45* 44*  CREATININE 2.44*  --  2.42* 2.47*  CALCIUM 8.4*  --  8.2* 8.3*  MG  --  2.4  --   --    GFR: Estimated Creatinine Clearance: 27 mL/min (A) (by C-G formula based on SCr of 2.47 mg/dL (H)). Liver Function Tests: Recent Labs  Lab 03/24/23 0503  AST 25  ALT 20  ALKPHOS 63  BILITOT 0.6  PROT 5.2*  ALBUMIN 2.9*   No results for input(s):  "LIPASE", "AMYLASE" in the last 168 hours. No results for input(s): "AMMONIA" in the last 168 hours. Coagulation Profile: Recent Labs  Lab 03/23/23 1223 03/24/23 1108 03/25/23 0351  INR 2.0* 2.2* 2.3*   Cardiac Enzymes: No results for input(s): "CKTOTAL", "CKMB", "CKMBINDEX", "TROPONINI" in the last 168 hours. BNP (last 3 results) Recent Labs    12/16/22 0959  PROBNP 3,692*   HbA1C: No results for input(s): "HGBA1C" in the last 72 hours. CBG: No results for input(s): "GLUCAP" in the last 168 hours. Lipid Profile: No results for input(s): "CHOL", "HDL", "LDLCALC", "TRIG", "CHOLHDL", "LDLDIRECT" in the last 72 hours. Thyroid Function Tests: No results for input(s): "TSH", "T4TOTAL", "FREET4", "T3FREE", "THYROIDAB" in the last 72 hours. Anemia Panel: Recent Labs    03/24/23 0503 03/24/23 1108 03/24/23 1139  VITAMINB12  --   --  537  FOLATE 24.8  --   --   FERRITIN  --  144  --   TIBC  --  266  --   IRON  --  60  --   RETICCTPCT  --   --  4.5*   Sepsis Labs: No results for input(s): "PROCALCITON", "LATICACIDVEN" in the last 168 hours.  Recent Results (from the past 240 hour(s))  Surgical pcr screen     Status: None   Collection Time: 03/25/23  4:41 AM   Specimen: Nasal Mucosa; Nasal Swab  Result Value Ref Range Status   MRSA, PCR NEGATIVE NEGATIVE Final   Staphylococcus aureus NEGATIVE NEGATIVE Final    Comment: (NOTE) The Xpert SA Assay (FDA approved for NASAL specimens in patients 57 years of age and older), is one component of a comprehensive surveillance program. It is not intended to diagnose infection nor to guide or monitor treatment. Performed at [facility], [address], [place], [place] [address]          Radiology Studies: [place] EKG SITE RITE  Result Date: 03/24/2023 If [facility] image not attached, placement could not be confirmed due to current cardiac rhythm.       Scheduled Meds:  sodium chloride   Intravenous Once    amiodarone  200 mg Oral Once per day on Monday Tuesday Thursday Friday Saturday   levothyroxine  88 mcg Oral Daily   melatonin  5 mg Oral QHS   quiniDINE gluconate  324 mg Oral BID   sodium chloride flush  3 mL Intravenous Q12H   Continuous Infusions:  heparin 1,000 Units/hr (03/24/23 1521)     LOS: 2 days    Time spent: 51 minutes spent on chart review, discussion with nursing staff, consultants, updating family and interview/physical exam; more than  50% of that time was spent in counseling and/or coordination of care.    Alvira Philips [place], DO [facility] Available via Epic secure chat 7am-7pm After these hours, please refer to coverage provider listed on [email or web address] 03/25/2023, 11:08 AM

## 2023-03-25 NOTE — Progress Notes (Signed)
ANTICOAGULATION CONSULT NOTE   Pharmacy Consult for heparin  Indication:  afib with a mechanical MVR  No Known Allergies  Patient Measurements: Height: 6\' 1"  (185.4 cm) Weight: 91.3 kg (201 lb 3.2 oz) IBW/kg (Calculated) : 79.9 Heparin Dosing Weight: 88.5 kg  Vital Signs: Temp: 97.8 F (36.6 C) (08/30 0816) Temp Source: Oral (08/30 0816) BP: 93/51 (08/30 0816) Pulse Rate: 56 (08/30 0745)  Labs: Recent Labs    03/23/23 1018 03/23/23 1223 03/23/23 1637 03/23/23 2201 03/23/23 2347 03/24/23 0503 03/24/23 0920 03/24/23 1108 03/24/23 1648 03/25/23 0351  HGB 9.3* 8.7*   < > 8.8*  --  7.9* 7.2*  --  8.5* 7.7*  HCT 28.4* 27.4*   < > 27.8*  --  24.7* 22.6*  --  25.9* 24.6*  PLT 151  --    < > 137*  --  126*  --   --   --  138*  LABPROT  --  22.7*  --   --   --   --   --  25.1*  --  25.8*  INR  --  2.0*  --   --   --   --   --  2.2*  --  2.3*  HEPARINUNFRC  --   --   --   --  1.04*  --   --  0.60  --  0.47  CREATININE 2.44*  --   --   --   --  2.42*  --   --   --  2.47*   < > = values in this interval not displayed.    Estimated Creatinine Clearance: 27 mL/min (A) (by C-G formula based on SCr of 2.47 mg/dL (H)).   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   Conductive hearing loss    uses hearing aids   Hypothyroidism    Intraspinal abscess    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   Nonischemic cardiomyopathy (HCC)    Psychosexual dysfunction with inhibited sexual excitement    S/P mitral valve replacement    Syncope and collapse    Unspecified sleep apnea    last sleep study 11/07, uses cpap   Ventricular tachycardia (HCC)     VT (ventricular tachycardia) (HCC)     Medications:  Medications Prior to Admission  Medication Sig Dispense Refill Last Dose   acetaminophen (TYLENOL) 500 MG tablet Take 1,000 mg by mouth every 6 (six) hours as needed for mild pain.   03/22/2023   amiodarone (PACERONE) 200 MG tablet Take 1 tablet (200 mg total) by mouth daily. Do NOT take Amiodarone on Wednesday or Sunday. 30 tablet 11 03/22/2023   amoxicillin (AMOXIL) 500 MG capsule TAKE 4 CAPSULES BY MOUTH 30 MINUTES PRIOR TO DENTAL WORK 4 capsule 2 unknown   furosemide (LASIX) 20 MG tablet Take 3 tablets (60 mg total) by mouth 2 (two) times daily. 180 tablet 8 03/22/2023   HYDROcodone-acetaminophen (NORCO) 5-325 MG tablet Take 1 tablet by mouth every 6 (six) hours as needed for moderate pain. 15 tablet 0 unknown   levothyroxine (SYNTHROID) 88 MCG tablet TAKE 1 TABLET BY MOUTH EVERY DAY 90 tablet 0 03/22/2023  melatonin 5 MG TABS Take 5 mg by mouth at bedtime.   03/22/2023   metoprolol succinate (TOPROL-XL) 25 MG 24 hr tablet Take 0.5 tablets (12.5 mg total) by mouth daily. 90 tablet 3 03/22/2023 at 2200   Multiple Vitamin (MULTIVITAMIN WITH MINERALS) TABS tablet Take 1 tablet by mouth in the morning.   03/22/2023   mupirocin ointment (BACTROBAN) 2 % Apply 1 Application topically daily. 30 g 2 Past Month   nitroGLYCERIN (NITROSTAT) 0.4 MG SL tablet Place 1 tablet (0.4 mg total) under the tongue every 5 (five) minutes x 3 doses as needed for chest pain. 25 tablet 2 unknown   quiniDINE gluconate 324 MG CR tablet TAKE 1 TABLET BY MOUTH 2 TIMES DAILY. 180 tablet 3 03/22/2023   spironolactone (ALDACTONE) 25 MG tablet TAKE 1/2 TABLET BY MOUTH EVERY DAY 45 tablet 3 03/22/2023   valsartan (DIOVAN) 40 MG tablet Take 40 mg by mouth daily.   03/22/2023   warfarin (COUMADIN) 5 MG tablet TAKE AS DIRECTED BY THE COUMADIN CLINIC (Patient taking differently: Take 2.5-5 mg by mouth See admin instructions. Take 5 mg by mouth on Mon., Wed., and Friday in the evening, all  the other days take 2.5 mg by mouth in the evening TAKE AS DIRECTED BY THE COUMADIN CLINIC) 80 tablet 1 03/22/2023 at 2200   zinc gluconate 50 MG tablet Take 1 tablet (50 mg total) by mouth daily. 90 tablet 1 03/22/2023   enoxaparin (LOVENOX) 80 MG/0.8ML injection Inject 0.8 mLs (80 mg total) into the skin every 12 (twelve) hours. (Patient not taking: Reported on 03/23/2023) 16 mL 1 Not Taking   Scheduled:  Infusions:   Assessment: 82 yo male presenting with hematoma on warfarin PTA for afib with a history of mechanical MVR (goal INR 3-3.5). Home dose is 5 mg M/W/F and 2.5 mg all other days. Pt previously had a procedure and was started on lovenox and restarted his warfarin on Saturday, 8/24. Last dose of lovenox was 8/28 at 2100. Pharmacy has been consulted to dose heparin in anticipation of I and D.   -heparin level at goal on 1000 units/hr -hg= 7.7 (down s/p PRBC)  Goal of Therapy:  Heparin level 0.3-0.5; due to low Hg and chest hematoma  Monitor platelets by anticoagulation protocol: Yes   Plan:  -Continue heparin at 1000 units/hr -will follow plans post OR  Harland German, PharmD Clinical Pharmacist **Pharmacist phone directory can now be found on [email or web address] ([email or web address]).  Listed under [facility].

## 2023-03-25 NOTE — Progress Notes (Signed)
Stop heparin per [name].

## 2023-03-25 NOTE — Anesthesia Postprocedure Evaluation (Signed)
Anesthesia Post Note  Patient: Austin [name]  Procedure(s) Performed: Washout of Chest Wall Hematoma (Left: Chest)     Patient location during evaluation: PACU Anesthesia Type: General Level of consciousness: awake and alert Pain management: pain level controlled Vital Signs Assessment: post-procedure vital signs reviewed and stable Respiratory status: spontaneous breathing, nonlabored ventilation and respiratory function stable Cardiovascular status: stable and blood pressure returned to baseline Anesthetic complications: no   No notable events documented.  Last Vitals:  Vitals:   03/25/23 1430 03/25/23 1451  BP: (!) 102/45 (!) 101/53  Pulse: 66 67  Resp: 11 12  Temp: 36.5 C 36.7 C  SpO2: 96% 98%    Last Pain:  Vitals:   03/25/23 1451  TempSrc: Oral  PainSc:                  [name]

## 2023-03-25 NOTE — Plan of Care (Signed)
  Problem: Activity: Goal: Risk for activity intolerance will decrease Outcome: Progressing   Problem: Nutrition: Goal: Adequate nutrition will be maintained Outcome: Progressing   Problem: Coping: Goal: Level of anxiety will decrease Outcome: Progressing   Problem: Elimination: Goal: Will not experience complications related to urinary retention Outcome: Progressing   Problem: Pain Managment: Goal: General experience of comfort will improve Outcome: Progressing   Problem: Safety: Goal: Ability to remain free from injury will improve Outcome: Progressing   Problem: Skin Integrity: Goal: Risk for impaired skin integrity will decrease Outcome: Progressing   

## 2023-03-26 DIAGNOSIS — T888XXA Other specified complications of surgical and medical care, not elsewhere classified, initial encounter: Secondary | ICD-10-CM | POA: Diagnosis not present

## 2023-03-26 LAB — CBC
HCT: 23.8 % — ABNORMAL LOW (ref 39.0–52.0)
Hemoglobin: 7.6 g/dL — ABNORMAL LOW (ref 13.0–17.0)
MCH: 31 pg (ref 26.0–34.0)
MCHC: 31.9 g/dL (ref 30.0–36.0)
MCV: 97.1 fL (ref 80.0–100.0)
Platelets: 131 10*3/uL — ABNORMAL LOW (ref 150–400)
RBC: 2.45 MIL/uL — ABNORMAL LOW (ref 4.22–5.81)
RDW: 18.1 % — ABNORMAL HIGH (ref 11.5–15.5)
WBC: 15.7 10*3/uL — ABNORMAL HIGH (ref 4.0–10.5)
nRBC: 0.4 % — ABNORMAL HIGH (ref 0.0–0.2)

## 2023-03-26 LAB — BPAM FFP
Blood Product Expiration Date: 202408312359
Blood Product Expiration Date: 202408312359
Blood Product Expiration Date: 202409042359
ISSUE DATE / TIME: 202408301209
ISSUE DATE / TIME: 202408301257
ISSUE DATE / TIME: 202408302045
Unit Type and Rh: 6200
Unit Type and Rh: 8400
Unit Type and Rh: 8400

## 2023-03-26 LAB — PREPARE FRESH FROZEN PLASMA
Unit division: 0
Unit division: 0

## 2023-03-26 LAB — BASIC METABOLIC PANEL
Anion gap: 9 (ref 5–15)
BUN: 42 mg/dL — ABNORMAL HIGH (ref 8–23)
CO2: 26 mmol/L (ref 22–32)
Calcium: 8.1 mg/dL — ABNORMAL LOW (ref 8.9–10.3)
Chloride: 98 mmol/L (ref 98–111)
Creatinine, Ser: 2.36 mg/dL — ABNORMAL HIGH (ref 0.61–1.24)
GFR, Estimated: 27 mL/min — ABNORMAL LOW (ref >60)
Glucose, Bld: 128 mg/dL — ABNORMAL HIGH (ref 70–99)
Potassium: 5.1 mmol/L (ref 3.5–5.1)
Sodium: 133 mmol/L — ABNORMAL LOW (ref 135–145)

## 2023-03-26 LAB — PREPARE RBC (CROSSMATCH)

## 2023-03-26 LAB — PROTIME-INR
INR: 1.9 — ABNORMAL HIGH (ref 0.8–1.2)
Prothrombin Time: 22.2 s — ABNORMAL HIGH (ref 11.4–15.2)

## 2023-03-26 LAB — HEMOGLOBIN AND HEMATOCRIT, BLOOD
HCT: 25.4 % — ABNORMAL LOW (ref 39.0–52.0)
Hemoglobin: 8.2 g/dL — ABNORMAL LOW (ref 13.0–17.0)

## 2023-03-26 MED ORDER — FUROSEMIDE 10 MG/ML IJ SOLN
40.0000 mg | INTRAMUSCULAR | Status: AC
  Administered 2023-03-26: 40 mg via INTRAVENOUS
  Filled 2023-03-26: qty 4

## 2023-03-26 MED ORDER — SODIUM CHLORIDE 0.9% IV SOLUTION: Freq: Once | INTRAVENOUS | Status: AC

## 2023-03-26 MED ORDER — BISACODYL 5 MG PO TBEC
5.0000 mg | DELAYED_RELEASE_TABLET | Freq: Every day | ORAL | Status: DC | PRN
  Administered 2023-03-26 – 2023-03-27 (×2): 5 mg via ORAL
  Filled 2023-03-26 (×2): qty 1

## 2023-03-26 NOTE — Progress Notes (Signed)
Received patient at 1900H with no ongoing FFP transfusion. However, blood administration records shows FFP is still transfusing. This RN marked the said transfusion as completed by the previous shift. Second unit of FFP was given, verified with another RN. No transfusion reaction noted.

## 2023-03-26 NOTE — Progress Notes (Signed)
PROGRESS NOTE    Austin [name]  ZOX:[id number] DOB: 1940-09-17 DOA: 03/23/2023 PCP: [name], MD    Brief Narrative:   Austin [name] is a 82 y.o. 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  Progressive over the last 2 days with recent Barostim placement by vascular surgery on 03/17/2023 due to symptomatic advanced CHF.  Also endorses shortness of breath.  Denies fever, no chills, no abdominal pain, no nausea/vomiting/diarrhea.  In the ED, temperature 97.5 F, HR 61, RR 18, BP 82/62, SpO2 94% on room air.  WBC 9.6, hemoglobin 9.3, platelet count 151.  Sodium 135, potassium 4.4, chloride 97, CO2 28, glucose 131, BUN 48, creatinine 2.44.  BNP 352.5.  INR 2.0.  Chest x-ray with new stimulator implant left chest wall with prominent asymmetric soft tissue density surrounding the stimulator in the left chest wall concerning for hematoma, no acute cardiopulmonary disease process.  Vascular surgery was consulted.  Patient was given 500 mL IV fluid bolus, IV fentanyl/morphine, Zofran in the ED.  Warfarin was held and patient started on a heparin bridge given mechanical mitral valve.  TRH consulted for admission for further evaluation management of acute blood loss anemia secondary to hematoma surrounding recent surgical site  Assessment & Plan:   Acute blood loss anemia Left chest wall hematoma/seroma Patient presenting to ED with enlarging left chest region surrounding his surgical site associated with  chest discomfort and shortness of breath.  Recently underwent Barostim placement by vascular surgery, [name] on 03/17/2023 due to symptomatic events congestive heart failure.  Likely complicated by his need of chronic anticoagulation with Coumadin due to mechanical mitral valve.  Anemia panel with iron 60, TIBC 266, ferritin 144, folate 24.8, vitamin B12 537.  Vascular surgery was consulted and patient underwent I&D of left chest wall with placement of antibiotic beads, skin substitute, and wound VAC by [name] on 03/25/2023. -- Vascular surgery following, appreciate assistance -- Hgb 9.3>8.7>7.8>8.8>7.9>7.2>8.5>7.7>7.6 -- s/p 1 unit pRBC 8/29 and 8/30, 2u FFP 8/30; repeat 1 u pRBC today, repeat H&H following transfusion -- H&H following transfusion and repeat CBC in a.m., transfuse for hemoglobin less than 8.0 given his underlying cardiac condition -- Continue to monitor wound VAC output, 50 mL past 24 hours  Permanent atrial fibrillation/atypical flutter -- Continue amiodarone 200 mg daily on M/T/T/F/Sat -- Holding metoprolol given hypotension -- Monitor on telemetry  History of ventricular tachycardia -- Continue home quinidine, amiodarone  Hx complete heart block s/p PPM -- Continue monitor on telemetry  Chronic systolic congestive heart failure, compensated Follows with advanced heart failure clinic, last TTE with LVEF 20-25%, indeterminate diastolic dysfunction, mildly reduced RV function.  S/p AICD/PPM.  Recently underwent Bariostim implantation on 8/22 by vascular surgery for symptomatic advanced heart failure. -- Holding home metoprolol, valsartan, spironolactone, Lasix given hypotension; but will give Lasix 40 IV x 1 after blood transfusion today -- Continue monitor BP closely -- Strict I's and O's and daily weights  Hx MV endocarditis s/p mechanical mitral valve replacement On Coumadin outpatient. -- Holding home Coumadin -- IV heparin bridge on hold  x 4 days per  recommendations of vascular surgery  CKD stage IIIb -- Cr 2.44>2.42>2.47>2.36; stable (baseline 2.2 - 2.7) -- Avoid nephrotoxins, renal dose all medications -- BMP daily  Hypothyroidism -- Continue levothyroxine  Foot callus Follows with podiatry, [name] outpatient.  Pending outpatient MRI.  Outpatient follow-up with podiatry/vascular surgery.  OSA: --Continue nocturnal CPAP   DVT prophylaxis: Holding anticoagulants due to hematoma per vascular surgery recommendations    Code Status: Full Code Family Communication: No family present at bedside this morning  Disposition Plan:  Level of care: Progressive Status is: Inpatient Remains inpatient appropriate because: Needs close monitoring of left chest hematoma/seroma    Consultants:  Vascular surgery  Procedures:  None  Antimicrobials:  None   Subjective: Patient seen examined bedside, resting comfortably.  Lying in bed.  Chest wall tightness improved after I&D by vascular surgery yesterday.  Hemoglobin remains 11.2 despite transfusion, will give another transfusion today.  RN present at bedside.  Patient feels like he is "gaining weight".  Will give dose of IV Lasix following transfusion today.  Her off of anticoagulation for 4 days per vascular surgery. No other specific complaints or concerns at this time. Patient denies headache, no fever/chills/night sweats, no visual changes, no abdominal pain, no current shortness of breath, no focal weakness, no fatigue, no paresthesias.  No acute events overnight per nursing staff.    Objective: Vitals:   03/26/23 0828 03/26/23 0913 03/26/23 0943 03/26/23 1144  BP: (!) 82/46 (!) 85/56 (!) 88/54 (!) 110/56  Pulse:  (!) 59 65 62  Resp: 17 16 16 20   Temp: 98.7 F (37.1 C) 97.9 F (36.6 C) 97.6 F (36.4 C) 97.8 F (36.6 C)  TempSrc: Oral Oral Oral Oral  SpO2:  100% 100%   Weight:      Height:        Intake/Output Summary (Last 24 hours) at 03/26/2023 1157 Last data  filed at 03/26/2023 1148 Gross per 24 hour  Intake 1481 ml  Output 1240 ml  Net 241 ml   Filed Weights   03/24/23 0432 03/25/23 0305 03/26/23 0554  Weight: 89.9 kg 91.3 kg 92.6 kg    Examination:  Physical Exam: GEN: NAD, alert and oriented x 3, wd/wn HEENT: NCAT, PERRL, EOMI, sclera clear, MMM PULM: CTAB w/o wheezes/crackles, normal respiratory effort, on room air CV: RRR w/o M/G/R, left chest wall hematoma/seroma decreased in size with wound VAC in place  GI: abd soft, NTND, NABS, no R/G/M MSK: no peripheral edema, muscle strength globally intact 5/5 bilateral upper/lower extremities NEURO: CN II-XII intact, no focal deficits, sensation to light touch intact PSYCH: normal mood/affect Integumentary: Left chest wall with a large presumed hematoma improved with wound VAC in place, right lateral foot callus as depicted below, otherwise no other concerning rashes/lesions/wounds noted on exposed skin surfaces.           Data Reviewed: I have personally reviewed following labs and imaging studies  CBC: Recent Labs  Lab 03/23/23 1018 03/23/23 1223 03/23/23 1637 03/23/23 2201 03/24/23 0503 03/24/23 0920 03/24/23 1648 03/25/23 0351 03/26/23 0606  WBC 9.6  --  8.9 11.9* 11.2*  --   --  15.7* 15.7*  NEUTROABS 7.8*  --   --   --   --   --   --   --   --   HGB 9.3*   < > 7.8* 8.8* 7.9* 7.2* 8.5* 7.7* 7.6*  HCT 28.4*   < > 24.8* 27.8* 24.7* 22.6* 25.9*  24.6* 23.8*  MCV 94.7  --  98.8 96.2 98.0  --   --  94.6 97.1  PLT 151  --  125* 137* 126*  --   --  138* 131*   < > = values in this interval not displayed.   Basic Metabolic Panel: Recent Labs  Lab 03/23/23 1018 03/23/23 2201 03/24/23 0503 03/25/23 0351 03/26/23 0606  NA 135  --  134* 134* 133*  K 4.4  --  4.8 4.9 5.1  CL 97*  --  99 98 98  CO2 28  --  22 25 26   GLUCOSE 131*  --  109* 116* 128*  BUN 48*  --  45* 44* 42*  CREATININE 2.44*  --  2.42* 2.47* 2.36*  CALCIUM 8.4*  --  8.2* 8.3* 8.1*  MG  --  2.4  --    --   --    GFR: Estimated Creatinine Clearance: 28.2 mL/min (A) (by C-G formula based on SCr of 2.36 mg/dL (H)). Liver Function Tests: Recent Labs  Lab 03/24/23 0503  AST 25  ALT 20  ALKPHOS 63  BILITOT 0.6  PROT 5.2*  ALBUMIN 2.9*   No results for input(s): "LIPASE", "AMYLASE" in the last 168 hours. No results for input(s): "AMMONIA" in the last 168 hours. Coagulation Profile: Recent Labs  Lab 03/23/23 1223 03/24/23 1108 03/25/23 0351 03/26/23 0606  INR 2.0* 2.2* 2.3* 1.9*   Cardiac Enzymes: No results for input(s): "CKTOTAL", "CKMB", "CKMBINDEX", "TROPONINI" in the last 168 hours. BNP (last 3 results) Recent Labs    12/16/22 0959  PROBNP 3,692*   HbA1C: No results for input(s): "HGBA1C" in the last 72 hours. CBG: No results for input(s): "GLUCAP" in the last 168 hours. Lipid Profile: No results for input(s): "CHOL", "HDL", "LDLCALC", "TRIG", "CHOLHDL", "LDLDIRECT" in the last 72 hours. Thyroid Function Tests: No results for input(s): "TSH", "T4TOTAL", "FREET4", "T3FREE", "THYROIDAB" in the last 72 hours. Anemia Panel: Recent Labs    03/24/23 0503 03/24/23 1108 03/24/23 1139  VITAMINB12  --   --  537  FOLATE 24.8  --   --   FERRITIN  --  144  --   TIBC  --  266  --   IRON  --  60  --   RETICCTPCT  --   --  4.5*   Sepsis Labs: No results for input(s): "PROCALCITON", "LATICACIDVEN" in the last 168 hours.  Recent Results (from the past 240 hour(s))  Surgical pcr screen     Status: None   Collection Time: 03/25/23  4:41 AM   Specimen: Nasal Mucosa; Nasal Swab  Result Value Ref Range Status   MRSA, PCR NEGATIVE NEGATIVE Final   Staphylococcus aureus NEGATIVE NEGATIVE Final    Comment: (NOTE) The Xpert SA Assay (FDA approved for NASAL specimens in patients 65 years of age and older), is one component of a comprehensive surveillance program. It is not intended to diagnose infection nor to guide or monitor treatment. Performed at [facility],  [address] [place], [place] [address]          Radiology Studies: No results found.      Scheduled Meds:  amiodarone  200 mg Oral Once per day on Monday Tuesday Thursday Friday Saturday   furosemide  40 mg Intravenous On Call   levothyroxine  88 mcg Oral Daily   melatonin  5 mg Oral QHS   polyethylene glycol  17 g Oral Daily   quiniDINE gluconate  324  mg Oral BID   sodium chloride flush  3 mL Intravenous Q12H   Continuous Infusions:     LOS: 3 days    Time spent: 51 minutes spent on chart review, discussion with nursing staff, consultants, updating family and interview/physical exam; more than 50% of that time was spent in counseling and/or coordination of care.    Alvira Philips [place], DO [facility] Available via Epic secure chat 7am-7pm After these hours, please refer to coverage provider listed on [email or web address] 03/26/2023, 11:57 AM

## 2023-03-26 NOTE — Progress Notes (Addendum)
  Progress Note    03/26/2023 7:32 AM 1 Day Post-Op  Subjective:  says he feels like he is gaining fluid and said they said they were gonna give him lasix but haven't yet.  Says he woke up and could hear wind blowing in his ears like a fan was blowing.  He put kleenex in his ears that somewhat helped but still present.   afebrile  Vitals:   03/25/23 2247 03/26/23 0554  BP: (!) 104/53 (!) 92/51  Pulse: 60 64  Resp: 18 18  Temp: 97.7 F (36.5 C) 97.8 F (36.6 C)  SpO2: 96% 97%    Physical Exam: General:  no distress; sitting in bed eating breakfast Lungs:  non labored Incisions:  wound vac in place and in tact.    CBC    Component Value Date/Time   WBC 15.7 (H) 03/26/2023 0606   RBC 2.45 (L) 03/26/2023 0606   HGB 7.6 (L) 03/26/2023 0606   HGB 16.6 12/04/2020 1445   HCT 23.8 (L) 03/26/2023 0606   HCT 50.1 12/04/2020 1445   PLT 131 (L) 03/26/2023 0606   PLT 158 12/04/2020 1445   MCV 97.1 03/26/2023 0606   MCV 94 12/04/2020 1445   MCH 31.0 03/26/2023 0606   MCHC 31.9 03/26/2023 0606   RDW 18.1 (H) 03/26/2023 0606   RDW 13.7 12/04/2020 1445   LYMPHSABS 0.4 (L) 03/23/2023 1018   MONOABS 1.0 03/23/2023 1018   EOSABS 0.1 03/23/2023 1018   BASOSABS 0.0 03/23/2023 1018    BMET    Component Value Date/Time   NA 133 (L) 03/26/2023 0606   NA 139 01/29/2021 0853   K 5.1 03/26/2023 0606   CL 98 03/26/2023 0606   CO2 26 03/26/2023 0606   GLUCOSE 128 (H) 03/26/2023 0606   BUN 42 (H) 03/26/2023 0606   BUN 18 01/29/2021 0853   CREATININE 2.36 (H) 03/26/2023 0606   CREATININE 1.22 (H) 09/12/2015 0846   CALCIUM 8.1 (L) 03/26/2023 0606   GFRNONAA 27 (L) 03/26/2023 0606   GFRAA 56 (L) 04/30/2020 1144    INR    Component Value Date/Time   INR 1.9 (H) 03/26/2023 0606     Intake/Output Summary (Last 24 hours) at 03/26/2023 0732 Last data filed at 03/26/2023 0981 Gross per 24 hour  Intake 1843 ml  Output 1040 ml  Net 803 ml      Assessment/Plan:  82 y.o. male  is s/p:  I&D left chest wall with placement of abx beads and skin substitute and wound vac placement 03/25/2023 by [name]  1 Day Post-Op   -pt had clear blood colored simple fluid under the muscle surrounding the generator.   -hgb stable from yesterday at 7.6 (7.7 yesterday)-he did receive 2 units PRBCs and 2 units FFP yesterday. -wound vac with good seal and had 50cc output.  For vac change on Monday.    -DVT prophylaxis:  [name] discussed with cardiology and heparin to be held 4 days.  -given hx of CHF, AICD and Afib, would probably benefit from cardiology consult-will defer to primary team   Doreatha Massed, PA-C [facility] [phone] 03/26/2023 7:32 AM  I have independently interviewed and examined patient and agree with PA assessment and plan above.  Restart heparin tomorrow and change wound VAC Monday.  Telly Jawad C. Randie Heinz, MD [facility] [place] Office: [phone] Pager: [phone]

## 2023-03-26 NOTE — Progress Notes (Signed)
   03/26/23 1929  BiPAP/CPAP/SIPAP  BiPAP/CPAP/SIPAP Pt Type Adult  Reason BIPAP/CPAP not in use Non-compliant

## 2023-03-26 NOTE — Plan of Care (Signed)
  Problem: Health Behavior/Discharge Planning: Goal: Ability to manage health-related needs will improve Outcome: Progressing   Problem: Clinical Measurements: Goal: Ability to maintain clinical measurements within normal limits will improve Outcome: Progressing Goal: Will remain free from infection Outcome: Progressing Goal: Diagnostic test results will improve Outcome: Progressing Goal: Respiratory complications will improve Outcome: Progressing Goal: Cardiovascular complication will be avoided Outcome: Progressing   Problem: Activity: Goal: Risk for activity intolerance will decrease Outcome: Progressing   Problem: Nutrition: Goal: Adequate nutrition will be maintained Outcome: Progressing   Problem: Pain Managment: Goal: General experience of comfort will improve Outcome: Progressing   Problem: Safety: Goal: Ability to remain free from injury will improve Outcome: Progressing   Problem: Skin Integrity: Goal: Risk for impaired skin integrity will decrease Outcome: Progressing   

## 2023-03-26 NOTE — Plan of Care (Signed)
Continue to monitor patient.

## 2023-03-27 ENCOUNTER — Encounter (HOSPITAL_COMMUNITY): Payer: Self-pay | Admitting: Surgery

## 2023-03-27 DIAGNOSIS — T888XXA Other specified complications of surgical and medical care, not elsewhere classified, initial encounter: Secondary | ICD-10-CM | POA: Diagnosis not present

## 2023-03-27 LAB — BASIC METABOLIC PANEL
Anion gap: 6 (ref 5–15)
BUN: 45 mg/dL — ABNORMAL HIGH (ref 8–23)
CO2: 27 mmol/L (ref 22–32)
Calcium: 8.8 mg/dL — ABNORMAL LOW (ref 8.9–10.3)
Chloride: 102 mmol/L (ref 98–111)
Creatinine, Ser: 2.33 mg/dL — ABNORMAL HIGH (ref 0.61–1.24)
GFR, Estimated: 28 mL/min — ABNORMAL LOW (ref >60)
Glucose, Bld: 104 mg/dL — ABNORMAL HIGH (ref 70–99)
Potassium: 4.5 mmol/L (ref 3.5–5.1)
Sodium: 135 mmol/L (ref 135–145)

## 2023-03-27 LAB — CBC
HCT: 23.6 % — ABNORMAL LOW (ref 39.0–52.0)
Hemoglobin: 7.7 g/dL — ABNORMAL LOW (ref 13.0–17.0)
MCH: 30.8 pg (ref 26.0–34.0)
MCHC: 32.6 g/dL (ref 30.0–36.0)
MCV: 94.4 fL (ref 80.0–100.0)
Platelets: 126 10*3/uL — ABNORMAL LOW (ref 150–400)
RBC: 2.5 MIL/uL — ABNORMAL LOW (ref 4.22–5.81)
RDW: 18.7 % — ABNORMAL HIGH (ref 11.5–15.5)
WBC: 11.6 10*3/uL — ABNORMAL HIGH (ref 4.0–10.5)
nRBC: 0.4 % — ABNORMAL HIGH (ref 0.0–0.2)

## 2023-03-27 LAB — TYPE AND SCREEN
ABO/RH(D): A POS
Antibody Screen: NEGATIVE
Unit division: 0
Unit division: 0
Unit division: 0

## 2023-03-27 LAB — BPAM RBC
Blood Product Expiration Date: 202409172359
Blood Product Expiration Date: 202409232359
Blood Product Expiration Date: 202409252359
ISSUE DATE / TIME: 202408291200
ISSUE DATE / TIME: 202408300747
ISSUE DATE / TIME: 202408310906
Unit Type and Rh: 6200
Unit Type and Rh: 6200
Unit Type and Rh: 6200

## 2023-03-27 LAB — PROTIME-INR
INR: 1.7 — ABNORMAL HIGH (ref 0.8–1.2)
Prothrombin Time: 19.8 s — ABNORMAL HIGH (ref 11.4–15.2)

## 2023-03-27 MED ORDER — GUAIFENESIN-DM 100-10 MG/5ML PO SYRP
5.0000 mL | ORAL_SOLUTION | ORAL | Status: DC | PRN
  Administered 2023-03-27 – 2023-03-28 (×3): 5 mL via ORAL
  Filled 2023-03-27 (×3): qty 5

## 2023-03-27 MED ORDER — PNEUMOCOCCAL 20-VAL CONJ VACC 0.5 ML IM SUSY
0.5000 mL | PREFILLED_SYRINGE | INTRAMUSCULAR | Status: AC
  Administered 2023-04-01: 0.5 mL via INTRAMUSCULAR
  Filled 2023-03-27 (×2): qty 0.5

## 2023-03-27 MED ORDER — PHENOL 1.4 % MT LIQD: 1.0000 | OROMUCOSAL | Status: DC | PRN

## 2023-03-27 NOTE — Progress Notes (Addendum)
  Progress Note    03/27/2023 8:06 AM 2 Days Post-Op  Subjective:  per RN, vac was alarming and lost seal overnight.  The machine and the sponge dressing was changed.  RN reports 100cc in canister prior to change.   Afebrile  Vitals:   03/26/23 1952 03/27/23 0350  BP: (!) 86/50 93/61  Pulse: 62 71  Resp: 20 18  Temp: 97.8 F (36.6 C) 97.7 F (36.5 C)  SpO2: 100% 100%    Physical Exam: General:  no distress Lungs:  non labored Incisions:  left chest with wound vac with sponge with good seal.  Some swelling around left chest   CBC    Component Value Date/Time   WBC 11.6 (H) 03/27/2023 0502   RBC 2.50 (L) 03/27/2023 0502   HGB 7.7 (L) 03/27/2023 0502   HGB 16.6 12/04/2020 1445   HCT 23.6 (L) 03/27/2023 0502   HCT 50.1 12/04/2020 1445   PLT 126 (L) 03/27/2023 0502   PLT 158 12/04/2020 1445   MCV 94.4 03/27/2023 0502   MCV 94 12/04/2020 1445   MCH 30.8 03/27/2023 0502   MCHC 32.6 03/27/2023 0502   RDW 18.7 (H) 03/27/2023 0502   RDW 13.7 12/04/2020 1445   LYMPHSABS 0.4 (L) 03/23/2023 1018   MONOABS 1.0 03/23/2023 1018   EOSABS 0.1 03/23/2023 1018   BASOSABS 0.0 03/23/2023 1018    BMET    Component Value Date/Time   NA 135 03/27/2023 0502   NA 139 01/29/2021 0853   K 4.5 03/27/2023 0502   CL 102 03/27/2023 0502   CO2 27 03/27/2023 0502   GLUCOSE 104 (H) 03/27/2023 0502   BUN 45 (H) 03/27/2023 0502   BUN 18 01/29/2021 0853   CREATININE 2.33 (H) 03/27/2023 0502   CREATININE 1.22 (H) 09/12/2015 0846   CALCIUM 8.8 (L) 03/27/2023 0502   GFRNONAA 28 (L) 03/27/2023 0502   GFRAA 56 (L) 04/30/2020 1144    INR    Component Value Date/Time   INR 1.7 (H) 03/27/2023 0502     Intake/Output Summary (Last 24 hours) at 03/27/2023 0806 Last data filed at 03/27/2023 0656 Gross per 24 hour  Intake 642 ml  Output 975 ml  Net -333 ml      Assessment/Plan:  82 y.o. male is s/p:  I&D left chest wall with placement of abx beads and skin substitute and wound vac  placement 03/25/2023 by [name]   2 Days Post-Op   -pt wound vac alarming and lost seal overnight and RN changed vac dressing and machine.  Good seal now with ~ 100 cc of serosanguinous drainage in canister. Decrease in hgb this am-probably not related to left chest given present of drainage in canister.  -DVT prophylaxis:  medicine planning on holding heparin another day given drop in hgb this am.  -TOC consult and face to face placed for HHRN/vac   Doreatha Massed, PA-C [facility] [phone] 03/27/2023 8:06 AM   I have independently interviewed and examined patient and agree with PA assessment and plan above.  Heparin on hold.  Plan to change wound VAC tomorrow.  Jancy Sprankle C. Randie Heinz, MD [facility] Office: [phone] Pager: [phone]

## 2023-03-27 NOTE — Plan of Care (Signed)

## 2023-03-27 NOTE — Progress Notes (Signed)
   03/27/23 2213  BiPAP/CPAP/SIPAP  $ Non-Invasive Home Ventilator  Subsequent  BiPAP/CPAP/SIPAP Pt Type Adult  BiPAP/CPAP/SIPAP Resmed  Reason BIPAP/CPAP not in use Non-compliant

## 2023-03-27 NOTE — Plan of Care (Signed)
Patient has been walking in the hallway with a walker and family. He has walked 225ft. Still no bowel movement. PRNs given as well as Mirilax. Will continue to monitor patient.

## 2023-03-27 NOTE — Progress Notes (Signed)
PROGRESS NOTE    Austin [name]  ZOX:[id number] DOB: February 22, 1941 DOA: 03/23/2023 PCP: [name], MD    Brief Narrative:   Austin [name] is a 82 y.o. 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  Progressive over the last 2 days with recent Barostim placement by vascular surgery on 03/17/2023 due to symptomatic advanced CHF.  Also endorses shortness of breath.  Denies fever, no chills, no abdominal pain, no nausea/vomiting/diarrhea.  In the ED, temperature 97.5 F, HR 61, RR 18, BP 82/62, SpO2 94% on room air.  WBC 9.6, hemoglobin 9.3, platelet count 151.  Sodium 135, potassium 4.4, chloride 97, CO2 28, glucose 131, BUN 48, creatinine 2.44.  BNP 352.5.  INR 2.0.  Chest x-ray with new stimulator implant left chest wall with prominent asymmetric soft tissue density surrounding the stimulator in the left chest wall concerning for hematoma, no acute cardiopulmonary disease process.  Vascular surgery was consulted.  Patient was given 500 mL IV fluid bolus, IV fentanyl/morphine, Zofran in the ED.  Warfarin was held and patient started on a heparin bridge given mechanical mitral valve.  TRH consulted for admission for further evaluation management of acute blood loss anemia secondary to hematoma surrounding recent surgical site  Assessment & Plan:   Acute blood loss anemia Left chest wall hematoma/seroma Patient presenting to ED with enlarging left chest region surrounding his surgical site associated with  chest discomfort and shortness of breath.  Recently underwent Barostim placement by vascular surgery, [name] on 03/17/2023 due to symptomatic events congestive heart failure.  Likely complicated by his need of chronic anticoagulation with Coumadin due to mechanical mitral valve.  Anemia panel with iron 60, TIBC 266, ferritin 144, folate 24.8, vitamin B12 537.  Vascular surgery was consulted and patient underwent I&D of left chest wall with placement of antibiotic beads, skin substitute, and wound VAC by [name] on 03/25/2023. -- Vascular surgery following, appreciate assistance -- Hgb 9.3>8.7>7.8>8.8>7.9>7.2>8.5>7.7>7.6>7.7 -- s/p 1 unit pRBC 8/29 and 8/30, 2u FFP 8/30; 1 u pRBC 8/31 -- CBC in am -- Continue to monitor wound VAC output, 100 mL past 24 hours  Permanent atrial fibrillation/atypical flutter -- Continue amiodarone 200 mg daily on M/T/T/F/Sat -- Holding metoprolol given hypotension -- Monitor on telemetry  History of ventricular tachycardia -- Continue home quinidine, amiodarone  Hx complete heart block s/p PPM -- Continue monitor on telemetry  Chronic systolic congestive heart failure, compensated Follows with advanced heart failure clinic, last TTE with LVEF 20-25%, indeterminate diastolic dysfunction, mildly reduced RV function.  S/p AICD/PPM.  Recently underwent Bariostim implantation on 8/22 by vascular surgery for symptomatic advanced heart failure. -- Holding home metoprolol, valsartan, spironolactone, Lasix given hypotension -- Continue monitor BP closely -- Strict I's and O's and daily weights  Hx MV endocarditis s/p mechanical mitral valve replacement On Coumadin outpatient. -- Holding home Coumadin -- IV heparin bridge on hold x 4 days per recommendations of vascular surgery  CKD stage IIIb -- Cr 2.44>2.42>2.47>2.36>2.33; stable (baseline 2.2 - 2.7) -- Avoid nephrotoxins, renal dose all medications -- BMP daily  Hypothyroidism --  Continue  levothyroxine  Foot callus Follows with podiatry, [name] outpatient.  Pending outpatient MRI.  Outpatient follow-up with podiatry/vascular surgery.  OSA: --Continue nocturnal CPAP   DVT prophylaxis: Holding anticoagulants due to hematoma per vascular surgery recommendations    Code Status: Full Code Family Communication: No family present at bedside this morning  Disposition Plan:  Level of care: Progressive Status is: Inpatient Remains inpatient appropriate because: Needs close monitoring of left chest hematoma/seroma    Consultants:  Vascular surgery  Procedures:  None  Antimicrobials:  None   Subjective: Patient seen examined bedside, resting comfortably.  Lying in bed.  RN and vascular PA present.  Patient complains of poor sleep overnight due to alarming wound VAC which apparently lost its seal.  RN reports wound VAC exchanged as well as dressing reapplied.  Patient also feels that his chest wall site is "puffier".  No other specific complaints or concerns at this time. Patient denies headache, no fever/chills/night sweats, no visual changes, no abdominal pain, no current shortness of breath, no focal weakness, no fatigue, no paresthesias.  No acute events overnight per nursing staff.    Objective: Vitals:   03/26/23 1717 03/26/23 1929 03/26/23 1952 03/27/23 0350  BP: (!) 95/56  (!) 86/50 93/61  Pulse: 66 60 62 71  Resp: 20  20 18   Temp: 98.3 F (36.8 C)  97.8 F (36.6 C) 97.7 F (36.5 C)  TempSrc: Oral  Oral Oral  SpO2: 100% 99% 100% 100%  Weight:    92.9 kg  Height:        Intake/Output Summary (Last 24 hours) at 03/27/2023 1139 Last data filed at 03/27/2023 0830 Gross per 24 hour  Intake 879 ml  Output 785 ml  Net 94 ml   Filed Weights   03/25/23 0305 03/26/23 0554 03/27/23 0350  Weight: 91.3 kg 92.6 kg 92.9 kg    Examination:  Physical Exam: GEN: NAD, alert and oriented x 3, wd/wn HEENT: NCAT, PERRL, EOMI, sclera clear, MMM PULM: CTAB w/o  wheezes/crackles, normal respiratory effort, on room air CV: RRR w/o M/G/R, left chest wall hematoma/seroma decreased in size with wound VAC in place  GI: abd soft, NTND, NABS, no R/G/M MSK: no peripheral edema, muscle strength globally intact 5/5 bilateral upper/lower extremities NEURO: CN II-XII intact, no focal deficits, sensation to light touch intact PSYCH: normal mood/affect Integumentary: Left chest wall with a large presumed hematoma improved with wound VAC in place, right lateral foot callus as depicted below, otherwise no other concerning rashes/lesions/wounds noted on exposed skin surfaces.           Data Reviewed: I have personally reviewed following labs and imaging studies  CBC: Recent Labs  Lab 03/23/23 1018 03/23/23 1223 03/23/23 2201 03/24/23 0503 03/24/23 0920 03/24/23 1648 03/25/23 0351 03/26/23 0606 03/26/23 1334 03/27/23 0502  WBC 9.6   < > 11.9* 11.2*  --   --  15.7* 15.7*  --  11.6*  NEUTROABS 7.8*  --   --   --   --   --   --   --   --   --   HGB 9.3*   < > 8.8* 7.9*   < > 8.5* 7.7* 7.6* 8.2* 7.7*  HCT 28.4*   < > 27.8* 24.7*   < > 25.9* 24.6* 23.8* 25.4* 23.6*  MCV 94.7   < > 96.2 98.0  --   --  94.6 97.1  --  94.4  PLT 151   < > 137*  126*  --   --  138* 131*  --  126*   < > = values in this interval not displayed.   Basic Metabolic Panel: Recent Labs  Lab 03/23/23 1018 03/23/23 2201 03/24/23 0503 03/25/23 0351 03/26/23 0606 03/27/23 0502  NA 135  --  134* 134* 133* 135  K 4.4  --  4.8 4.9 5.1 4.5  CL 97*  --  99 98 98 102  CO2 28  --  22 25 26 27   GLUCOSE 131*  --  109* 116* 128* 104*  BUN 48*  --  45* 44* 42* 45*  CREATININE 2.44*  --  2.42* 2.47* 2.36* 2.33*  CALCIUM 8.4*  --  8.2* 8.3* 8.1* 8.8*  MG  --  2.4  --   --   --   --    GFR: Estimated Creatinine Clearance: 28.6 mL/min (A) (by C-G formula based on SCr of 2.33 mg/dL (H)). Liver Function Tests: Recent Labs  Lab 03/24/23 0503  AST 25  ALT 20  ALKPHOS 63  BILITOT 0.6   PROT 5.2*  ALBUMIN 2.9*   No results for input(s): "LIPASE", "AMYLASE" in the last 168 hours. No results for input(s): "AMMONIA" in the last 168 hours. Coagulation Profile: Recent Labs  Lab 03/23/23 1223 03/24/23 1108 03/25/23 0351 03/26/23 0606 03/27/23 0502  INR 2.0* 2.2* 2.3* 1.9* 1.7*   Cardiac Enzymes: No results for input(s): "CKTOTAL", "CKMB", "CKMBINDEX", "TROPONINI" in the last 168 hours. BNP (last 3 results) Recent Labs    12/16/22 0959  PROBNP 3,692*   HbA1C: No results for input(s): "HGBA1C" in the last 72 hours. CBG: No results for input(s): "GLUCAP" in the last 168 hours. Lipid Profile: No results for input(s): "CHOL", "HDL", "LDLCALC", "TRIG", "CHOLHDL", "LDLDIRECT" in the last 72 hours. Thyroid Function Tests: No results for input(s): "TSH", "T4TOTAL", "FREET4", "T3FREE", "THYROIDAB" in the last 72 hours. Anemia Panel: No results for input(s): "VITAMINB12", "FOLATE", "FERRITIN", "TIBC", "IRON", "RETICCTPCT" in the last 72 hours.  Sepsis Labs: No results for input(s): "PROCALCITON", "LATICACIDVEN" in the last 168 hours.  Recent Results (from the past 240 hour(s))  Surgical pcr screen     Status: None   Collection Time: 03/25/23  4:41 AM   Specimen: Nasal Mucosa; Nasal Swab  Result Value Ref Range Status   MRSA, PCR NEGATIVE NEGATIVE Final   Staphylococcus aureus NEGATIVE NEGATIVE Final    Comment: (NOTE) The Xpert SA Assay (FDA approved for NASAL specimens in patients 22 years of age and older), is one component of a comprehensive surveillance program. It is not intended to diagnose infection nor to guide or monitor treatment. Performed at [facility], [address], [place], [place] [address]          Radiology Studies: No results found.      Scheduled Meds:  amiodarone  200 mg Oral Once per day on Monday Tuesday Thursday Friday Saturday   levothyroxine  88 mcg Oral Daily   melatonin  5 mg Oral QHS   [START ON 03/28/2023]  pneumococcal 20-valent conjugate vaccine  0.5 mL Intramuscular Tomorrow-1000   polyethylene glycol  17 g Oral Daily   quiniDINE gluconate  324 mg Oral BID   sodium chloride flush  3 mL Intravenous Q12H   Continuous Infusions:     LOS: 4 days    Time spent: 51 minutes spent on chart review, discussion with nursing staff, consultants, updating family and interview/physical exam; more than 50% of that time was spent  in counseling and/or coordination of care.    Alvira Philips [place], DO [facility] Available via Epic secure chat 7am-7pm After these hours, please refer to coverage provider listed on [email or web address] 03/27/2023, 11:39 AM

## 2023-03-28 DIAGNOSIS — T888XXA Other specified complications of surgical and medical care, not elsewhere classified, initial encounter: Secondary | ICD-10-CM | POA: Diagnosis not present

## 2023-03-28 LAB — CBC
HCT: 26.3 % — ABNORMAL LOW (ref 39.0–52.0)
Hemoglobin: 8.2 g/dL — ABNORMAL LOW (ref 13.0–17.0)
MCH: 30 pg (ref 26.0–34.0)
MCHC: 31.2 g/dL (ref 30.0–36.0)
MCV: 96.3 fL (ref 80.0–100.0)
Platelets: 161 10*3/uL (ref 150–400)
RBC: 2.73 MIL/uL — ABNORMAL LOW (ref 4.22–5.81)
RDW: 18.9 % — ABNORMAL HIGH (ref 11.5–15.5)
WBC: 11.8 10*3/uL — ABNORMAL HIGH (ref 4.0–10.5)
nRBC: 0.5 % — ABNORMAL HIGH (ref 0.0–0.2)

## 2023-03-28 LAB — HEPARIN LEVEL (UNFRACTIONATED): Heparin Unfractionated: 0.16 [IU]/mL — ABNORMAL LOW (ref 0.30–0.70)

## 2023-03-28 MED ORDER — HEPARIN (PORCINE) 25000 UT/250ML-% IV SOLN
1450.0000 [IU]/h | INTRAVENOUS | Status: DC
  Administered 2023-03-28: 1000 [IU]/h via INTRAVENOUS
  Administered 2023-03-29 (×2): 1250 [IU]/h via INTRAVENOUS
  Administered 2023-03-30 – 2023-03-31 (×2): 1400 [IU]/h via INTRAVENOUS
  Administered 2023-04-01: 1450 [IU]/h via INTRAVENOUS
  Filled 2023-03-28 (×6): qty 250

## 2023-03-28 MED ORDER — SENNOSIDES-DOCUSATE SODIUM 8.6-50 MG PO TABS
1.0000 | ORAL_TABLET | Freq: Two times a day (BID) | ORAL | Status: DC
  Administered 2023-03-28 – 2023-04-01 (×8): 1 via ORAL
  Filled 2023-03-28 (×8): qty 1

## 2023-03-28 MED ORDER — CEFAZOLIN SODIUM-DEXTROSE 1-4 GM/50ML-% IV SOLN
1.0000 g | INTRAVENOUS | Status: AC
  Filled 2023-03-28 (×2): qty 50

## 2023-03-28 NOTE — Progress Notes (Signed)
ANTICOAGULATION CONSULT NOTE   Pharmacy Consult for heparin  Indication:  afib with a mechanical MVR  No Known Allergies  Patient Measurements: Height: 6\' 1"  (185.4 cm) Weight: 93.9 kg (207 lb 1.6 oz) IBW/kg (Calculated) : 79.9 Heparin Dosing Weight: 89.9 kg  Vital Signs: Temp: 98.6 F (37 C) (09/02 1933) Temp Source: Oral (09/02 1933) BP: 103/69 (09/02 1933) Pulse Rate: 68 (09/02 1933)  Labs: Recent Labs    03/26/23 0606 03/26/23 1334 03/27/23 0502 03/28/23 0306 03/28/23 2032  HGB 7.6* 8.2* 7.7* 8.2*  --   HCT 23.8* 25.4* 23.6* 26.3*  --   PLT 131*  --  126* 161  --   LABPROT 22.2*  --  19.8*  --   --   INR 1.9*  --  1.7*  --   --   HEPARINUNFRC  --   --   --   --  0.16*  CREATININE 2.36*  --  2.33*  --   --     Estimated Creatinine Clearance: 28.6 mL/min (A) (by C-G formula based on SCr of 2.33 mg/dL (H)).   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   Conductive hearing loss    uses hearing aids   Hypothyroidism    Intraspinal abscess    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   Nonischemic cardiomyopathy (HCC)    Psychosexual dysfunction with inhibited sexual excitement    S/P mitral valve replacement    Syncope and collapse    Unspecified sleep apnea    last sleep study 11/07, uses cpap   Ventricular tachycardia (HCC)    VT (ventricular tachycardia) (HCC)     Medications:  Medications Prior to Admission  Medication Sig Dispense Refill Last Dose   acetaminophen (TYLENOL) 500 MG tablet Take 1,000 mg by mouth every 6 (six) hours as needed for mild pain.   03/22/2023   amiodarone (PACERONE) 200 MG tablet Take 1 tablet  (200 mg total) by mouth daily. Do NOT take Amiodarone on Wednesday or Sunday. 30 tablet 11 03/22/2023   amoxicillin (AMOXIL) 500 MG capsule TAKE 4 CAPSULES BY MOUTH 30 MINUTES PRIOR TO DENTAL WORK 4 capsule 2 unknown   furosemide (LASIX) 20 MG tablet Take 3 tablets (60 mg total) by mouth 2 (two) times daily. 180 tablet 8 03/22/2023   HYDROcodone-acetaminophen (NORCO) 5-325 MG tablet Take 1 tablet by mouth every 6 (six) hours as needed for moderate pain. 15 tablet 0 unknown   levothyroxine (SYNTHROID) 88 MCG tablet TAKE 1 TABLET BY MOUTH EVERY DAY 90 tablet 0 03/22/2023   melatonin 5 MG TABS Take 5 mg by mouth at bedtime.   03/22/2023   metoprolol succinate (TOPROL-XL) 25 MG 24 hr tablet Take 0.5 tablets (12.5 mg total) by mouth daily. 90 tablet 3 03/22/2023 at 2200   Multiple Vitamin (MULTIVITAMIN WITH MINERALS) TABS tablet Take 1 tablet by mouth in the morning.   03/22/2023   mupirocin ointment (BACTROBAN) 2 % Apply 1 Application topically daily. 30 g 2 Past Month   nitroGLYCERIN (NITROSTAT) 0.4 MG SL tablet Place 1 tablet (0.4 mg total) under the tongue every 5 (five) minutes x 3 doses as needed for chest  pain. 25 tablet 2 unknown   quiniDINE gluconate 324 MG CR tablet TAKE 1 TABLET BY MOUTH 2 TIMES DAILY. 180 tablet 3 03/22/2023   spironolactone (ALDACTONE) 25 MG tablet TAKE 1/2 TABLET BY MOUTH EVERY DAY 45 tablet 3 03/22/2023   valsartan (DIOVAN) 40 MG tablet Take 40 mg by mouth daily.   03/22/2023   warfarin (COUMADIN) 5 MG tablet TAKE AS DIRECTED BY THE COUMADIN CLINIC (Patient taking differently: Take 2.5-5 mg by mouth See admin instructions. Take 5 mg by mouth on Mon., Wed., and Friday in the evening, all the other days take 2.5 mg by mouth in the evening TAKE AS DIRECTED BY THE COUMADIN CLINIC) 80 tablet 1 03/22/2023 at 2200   zinc gluconate 50 MG tablet Take 1 tablet (50 mg total) by mouth daily. 90 tablet 1 03/22/2023   enoxaparin (LOVENOX) 80 MG/0.8ML injection Inject 0.8 mLs (80 mg total) into the  skin every 12 (twelve) hours. (Patient not taking: Reported on 03/23/2023) 16 mL 1 Not Taking   Scheduled:  Infusions:   Assessment: 82 yo male presenting with hematoma on warfarin PTA for afib with a history of mechanical MVR (goal INR 3-3.5). Home dose is 5 mg M/W/F and 2.5 mg all other days. Patient underwent I&D of L chest wall hematoma on 8/30. Heparin and warfarin have been on hold for 3 days post-op. He did have an acute drop in Hgb from 8.2 > 7.7 on 9/1, thus delaying initiation of heparin. Hgb today is back up to 8.5 s/p PRBC. PLTs at 161. No new bleeding noted. Last heparin infusion dose noted at 1000 units/hr.   Heparin level came back subtherapeutic at 0.16 this PM. Plan to return to OR in AM.   Goal of Therapy:  Heparin level 0.3-0.7 Monitor platelets by anticoagulation protocol: Yes   Plan:  - No heparin bolus per vascular surgery - Increase heparin to 1150 units/hr  - Obtain heparin level in AM and daily while on heparin - Obtain daily CBC while on heparin - Follow-up plans to return to OR - Follow-up transition back to warfarin  Ulyses Southward, PharmD, BCIDP, AAHIVP, CPP Infectious Disease Pharmacist 03/28/2023 9:01 PM

## 2023-03-28 NOTE — Care Management Important Message (Signed)
Important Message  Patient Details  Name: Austin [name] MRN: [id number] Date of Birth: 01-07-1941   Medicare Important Message Given:  Yes     Renie Ora 03/28/2023, 10:10 AM

## 2023-03-28 NOTE — Progress Notes (Signed)
PROGRESS NOTE    [name]  WUX:[id number] DOB: [birth year] DOA: 03/23/2023 PCP: [name], MD    Brief Narrative:   Austin [name] is a 82 y.o. 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  Progressive over the last 2 days with recent Barostim placement by vascular surgery on 03/17/2023 due to symptomatic advanced CHF.  Also endorses shortness of breath.  Denies fever, no chills, no abdominal pain, no nausea/vomiting/diarrhea.  In the ED, temperature 97.5 F, HR 61, RR 18, BP 82/62, SpO2 94% on room air.  WBC 9.6, hemoglobin 9.3, platelet count 151.  Sodium 135, potassium 4.4, chloride 97, CO2 28, glucose 131, BUN 48, creatinine 2.44.  BNP 352.5.  INR 2.0.  Chest x-ray with new stimulator implant left chest wall with prominent asymmetric soft tissue density surrounding the stimulator in the left chest wall concerning for hematoma, no acute cardiopulmonary disease process.  Vascular surgery was consulted.  Patient was given 500 mL IV fluid bolus, IV fentanyl/morphine, Zofran in the ED.  Warfarin was held and patient started on a heparin bridge given mechanical mitral valve.  TRH consulted for admission for further evaluation management of acute blood loss anemia secondary to hematoma surrounding recent surgical site  Assessment & Plan:   Acute blood loss anemia Left chest wall hematoma/seroma Patient presenting to ED with enlarging left chest region surrounding his surgical site associated with  chest discomfort and shortness of breath.  Recently underwent Barostim placement by vascular surgery, [name] on 03/17/2023 due to symptomatic events congestive heart failure.  Likely complicated by his need of chronic anticoagulation with Coumadin due to mechanical mitral valve.  Anemia panel with iron 60, TIBC 266, ferritin 144, folate 24.8, vitamin B12 537.  Vascular surgery was consulted and patient underwent I&D of left chest wall with placement of antibiotic beads, skin substitute, and wound VAC by [name] on 03/25/2023. -- Vascular surgery following, appreciate assistance -- Hgb 9.3>8.7>7.8>8.8>7.9>7.2>8.5>7.7>7.6>7.7>8.2 -- s/p 1 unit pRBC 8/29 and 8/30, 2u FFP 8/30; 1 u pRBC 8/31 -- CBC in am -- Continue to monitor wound VAC output  Permanent atrial fibrillation/atypical flutter -- Continue amiodarone 200 mg daily on M/T/T/F/Sat -- Holding metoprolol given hypotension -- Started heparin drip 9/2 -- Monitor on telemetry  History of ventricular tachycardia -- Continue home quinidine, amiodarone  Hx complete heart block s/p PPM -- Continue monitor on telemetry  Chronic systolic congestive heart failure, compensated Follows with advanced heart failure clinic, last TTE with LVEF 20-25%, indeterminate diastolic dysfunction, mildly reduced RV function.  S/p AICD/PPM.  Recently underwent Bariostim implantation on 8/22 by vascular surgery for symptomatic advanced heart failure. -- Holding home metoprolol, valsartan, spironolactone, Lasix given hypotension -- Continue monitor BP closely -- Strict I's and O's and daily weights  Hx MV endocarditis s/p mechanical mitral valve replacement On Coumadin outpatient. -- Holding home Coumadin -- Started heparin drip today  CKD stage IIIb -- Cr 2.44>2.42>2.47>2.36>2.33; stable (baseline 2.2 - 2.7) -- Avoid nephrotoxins, renal dose all medications -- BMP daily  Hypothyroidism -- Continue levothyroxine  Foot callus Follows with podiatry,  [name] outpatient.  Pending outpatient MRI.  Outpatient follow-up with podiatry/vascular surgery.  OSA: --Continue nocturnal CPAP   DVT prophylaxis: Holding anticoagulants due to hematoma per vascular surgery recommendations    Code Status: Full Code Family Communication: No family present at bedside this morning  Disposition Plan:  Level of care: Progressive Status is: Inpatient Remains inpatient appropriate because: Needs close monitoring of left chest hematoma/seroma, started heparin drip today    Consultants:  Vascular surgery  Procedures:  None  Antimicrobials:  None   Subjective: Patient seen examined bedside, resting comfortably.  Lying in bed.  Patient continues to complain of left chest wall and believes it is slightly increasing in size over the last 2 days.  Hemoglobin stable, up to 8.2 today.  Seen by vascular surgery and started heparin drip. No other specific complaints or concerns at this time. Patient denies headache, no fever/chills/night sweats, no visual changes, no abdominal pain, no current shortness of breath, no focal weakness, no fatigue, no paresthesias.  No acute events overnight per nursing staff.    Objective: Vitals:   03/27/23 2035 03/28/23 0000 03/28/23 0400 03/28/23 0429  BP: 100/83   110/62  Pulse: 73   67  Resp: 20   18  Temp: 97.8 F (36.6 C)   97.6 F (36.4 C)  TempSrc: Oral   Oral  SpO2: 98% 97% 97% 98%  Weight:    93.9 kg  Height:        Intake/Output Summary (Last 24 hours) at 03/28/2023 0946 Last data filed at 03/28/2023 0905 Gross per 24 hour  Intake 3 ml  Output 300 ml  Net -297 ml   Filed Weights   03/26/23 0554 03/27/23 0350 03/28/23 0429  Weight: 92.6 kg 92.9 kg 93.9 kg    Examination:  Physical Exam: GEN: NAD, alert and oriented x 3, wd/wn HEENT: NCAT, PERRL, EOMI, sclera clear, MMM PULM: CTAB w/o wheezes/crackles, normal respiratory effort, on room air CV: RRR w/o M/G/R, left chest wall hematoma/seroma  decreased in size with wound VAC in place  GI: abd soft, NTND, NABS, no R/G/M MSK: no peripheral edema, muscle strength globally intact 5/5 bilateral upper/lower extremities NEURO: CN II-XII intact, no focal deficits, sensation to light touch intact PSYCH: normal mood/affect Integumentary: Left chest wall wound VAC in place, right lateral foot callus as depicted below, otherwise no other concerning rashes/lesions/wounds noted on exposed skin surfaces.           Data Reviewed: I have personally reviewed following labs and imaging studies  CBC: Recent Labs  Lab 03/23/23 1018 03/23/23 1223 03/24/23 0503 03/24/23 0920 03/25/23 0351 03/26/23 0606 03/26/23 1334 03/27/23 0502 03/28/23 0306  WBC 9.6   < > 11.2*  --  15.7* 15.7*  --  11.6* 11.8*  NEUTROABS 7.8*  --   --   --   --   --   --   --   --   HGB 9.3*   < > 7.9*   < > 7.7* 7.6* 8.2* 7.7* 8.2*  HCT 28.4*   < > 24.7*   < > 24.6* 23.8* 25.4* 23.6* 26.3*  MCV 94.7   < > 98.0  --  94.6 97.1  --  94.4 96.3  PLT 151   < > 126*  --  138* 131*  --  126* 161   < > = values in this interval not displayed.   Basic Metabolic Panel: Recent Labs  Lab 03/23/23 1018 03/23/23 2201 03/24/23 0503 03/25/23 0351  03/26/23 0606 03/27/23 0502  NA 135  --  134* 134* 133* 135  K 4.4  --  4.8 4.9 5.1 4.5  CL 97*  --  99 98 98 102  CO2 28  --  22 25 26 27   GLUCOSE 131*  --  109* 116* 128* 104*  BUN 48*  --  45* 44* 42* 45*  CREATININE 2.44*  --  2.42* 2.47* 2.36* 2.33*  CALCIUM 8.4*  --  8.2* 8.3* 8.1* 8.8*  MG  --  2.4  --   --   --   --    GFR: Estimated Creatinine Clearance: 28.6 mL/min (A) (by C-G formula based on SCr of 2.33 mg/dL (H)). Liver Function Tests: Recent Labs  Lab 03/24/23 0503  AST 25  ALT 20  ALKPHOS 63  BILITOT 0.6  PROT 5.2*  ALBUMIN 2.9*   No results for input(s): "LIPASE", "AMYLASE" in the last 168 hours. No results for input(s): "AMMONIA" in the last 168 hours. Coagulation Profile: Recent Labs  Lab  03/23/23 1223 03/24/23 1108 03/25/23 0351 03/26/23 0606 03/27/23 0502  INR 2.0* 2.2* 2.3* 1.9* 1.7*   Cardiac Enzymes: No results for input(s): "CKTOTAL", "CKMB", "CKMBINDEX", "TROPONINI" in the last 168 hours. BNP (last 3 results) Recent Labs    12/16/22 0959  PROBNP 3,692*   HbA1C: No results for input(s): "HGBA1C" in the last 72 hours. CBG: No results for input(s): "GLUCAP" in the last 168 hours. Lipid Profile: No results for input(s): "CHOL", "HDL", "LDLCALC", "TRIG", "CHOLHDL", "LDLDIRECT" in the last 72 hours. Thyroid Function Tests: No results for input(s): "TSH", "T4TOTAL", "FREET4", "T3FREE", "THYROIDAB" in the last 72 hours. Anemia Panel: No results for input(s): "VITAMINB12", "FOLATE", "FERRITIN", "TIBC", "IRON", "RETICCTPCT" in the last 72 hours.  Sepsis Labs: No results for input(s): "PROCALCITON", "LATICACIDVEN" in the last 168 hours.  Recent Results (from the past 240 hour(s))  Surgical pcr screen     Status: None   Collection Time: 03/25/23  4:41 AM   Specimen: Nasal Mucosa; Nasal Swab  Result Value Ref Range Status   MRSA, PCR NEGATIVE NEGATIVE Final   Staphylococcus aureus NEGATIVE NEGATIVE Final    Comment: (NOTE) The Xpert SA Assay (FDA approved for NASAL specimens in patients 20 years of age and older), is one component of a comprehensive surveillance program. It is not intended to diagnose infection nor to guide or monitor treatment. Performed at [facility], [address], [place], [place] [address]          Radiology Studies: No results found.      Scheduled Meds:  amiodarone  200 mg Oral Once per day on Monday Tuesday Thursday Friday Saturday   levothyroxine  88 mcg Oral Daily   melatonin  5 mg Oral QHS   pneumococcal 20-valent conjugate vaccine  0.5 mL Intramuscular Tomorrow-1000   polyethylene glycol  17 g Oral Daily   quiniDINE gluconate  324 mg Oral BID   sodium chloride flush  3 mL Intravenous Q12H   Continuous  Infusions:     LOS: 5 days    Time spent: 51 minutes spent on chart review, discussion with nursing staff, consultants, updating family and interview/physical exam; more than 50% of that time was spent in counseling and/or coordination of care.    Alvira Philips [place], DO [facility] Available via Epic secure chat 7am-7pm After these hours, please refer to coverage provider listed on [email or web address] 03/28/2023, 9:46 AM

## 2023-03-28 NOTE — Progress Notes (Addendum)
  Progress Note    03/28/2023 7:35 AM 3 Days Post-Op  Subjective:  resting in bed  afebrile  Vitals:   03/28/23 0400 03/28/23 0429  BP:  110/62  Pulse:  67  Resp:  18  Temp:  97.6 F (36.4 C)  SpO2: 97% 98%    Physical Exam: General:  no distress Lungs:  non labored Incisions:  wound vac in tact.  Fluid collection under skin appears to have increased from previous days.  Minimal output in vac   CBC    Component Value Date/Time   WBC 11.8 (H) 03/28/2023 0306   RBC 2.73 (L) 03/28/2023 0306   HGB 8.2 (L) 03/28/2023 0306   HGB 16.6 12/04/2020 1445   HCT 26.3 (L) 03/28/2023 0306   HCT 50.1 12/04/2020 1445   PLT 161 03/28/2023 0306   PLT 158 12/04/2020 1445   MCV 96.3 03/28/2023 0306   MCV 94 12/04/2020 1445   MCH 30.0 03/28/2023 0306   MCHC 31.2 03/28/2023 0306   RDW 18.9 (H) 03/28/2023 0306   RDW 13.7 12/04/2020 1445   LYMPHSABS 0.4 (L) 03/23/2023 1018   MONOABS 1.0 03/23/2023 1018   EOSABS 0.1 03/23/2023 1018   BASOSABS 0.0 03/23/2023 1018    BMET    Component Value Date/Time   NA 135 03/27/2023 0502   NA 139 01/29/2021 0853   K 4.5 03/27/2023 0502   CL 102 03/27/2023 0502   CO2 27 03/27/2023 0502   GLUCOSE 104 (H) 03/27/2023 0502   BUN 45 (H) 03/27/2023 0502   BUN 18 01/29/2021 0853   CREATININE 2.33 (H) 03/27/2023 0502   CREATININE 1.22 (H) 09/12/2015 0846   CALCIUM 8.8 (L) 03/27/2023 0502   GFRNONAA 28 (L) 03/27/2023 0502   GFRAA 56 (L) 04/30/2020 1144    INR    Component Value Date/Time   INR 1.7 (H) 03/27/2023 0502     Intake/Output Summary (Last 24 hours) at 03/28/2023 0735 Last data filed at 03/28/2023 0434 Gross per 24 hour  Intake 237 ml  Output 300 ml  Net -63 ml      Assessment/Plan:  82 y.o. male is s/p:  I&D left chest wall with placement of abx beads and skin substitute and wound vac placement 03/25/2023 by [name]   3 Days Post-Op   -wound vac with good seal but fluid collection  under skin appears to have increased  from previous days.  Will be back later this morning for vac change with [name] and he will further evaluate.   -hgb improved to 8.2 from 7.7 yesterday.   -DVT prophylaxis:  heparin currently on hold.  Pt with mechanical MVR and will need AC.  Discussed with cardiology-heparin gtt restarted today without bolus.    Doreatha Massed, PA-C [facility] [phone] 03/28/2023 7:35 AM  I agree with the above.  Have seen and evaluated the patient.  He does appear to have reaccumulated some fluid despite the wound VAC.  Therefore, I have recommended that we go back to the operating room tomorrow for another washout and likely drain placement.  He has been multiple days without anticoagulation.  He has a mechanical valve and so I have restarted his heparin.  He will be n.p.o. after midnight for procedure tomorrow  Durene Cal

## 2023-03-28 NOTE — Progress Notes (Signed)
ANTICOAGULATION CONSULT NOTE   Pharmacy Consult for heparin  Indication:  afib with a mechanical MVR  No Known Allergies  Patient Measurements: Height: 6\' 1"  (185.4 cm) Weight: 93.9 kg (207 lb 1.6 oz) IBW/kg (Calculated) : 79.9 Heparin Dosing Weight: 89.9 kg  Vital Signs: Temp: 97.6 F (36.4 C) (09/02 0429) Temp Source: Oral (09/02 0429) BP: 110/62 (09/02 0429) Pulse Rate: 67 (09/02 0429)  Labs: Recent Labs    03/26/23 0606 03/26/23 1334 03/27/23 0502 03/28/23 0306  HGB 7.6* 8.2* 7.7* 8.2*  HCT 23.8* 25.4* 23.6* 26.3*  PLT 131*  --  126* 161  LABPROT 22.2*  --  19.8*  --   INR 1.9*  --  1.7*  --   CREATININE 2.36*  --  2.33*  --     Estimated Creatinine Clearance: 28.6 mL/min (A) (by C-G formula based on SCr of 2.33 mg/dL (H)).   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   Conductive hearing loss    uses hearing aids   Hypothyroidism    Intraspinal abscess    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   Nonischemic cardiomyopathy (HCC)    Psychosexual dysfunction with inhibited sexual excitement    S/P mitral valve replacement    Syncope and collapse    Unspecified sleep apnea    last sleep study 11/07, uses cpap   Ventricular tachycardia (HCC)    VT (ventricular tachycardia) (HCC)     Medications:  Medications Prior to Admission  Medication Sig Dispense Refill Last Dose   acetaminophen (TYLENOL) 500 MG tablet Take 1,000 mg by mouth every 6 (six) hours as needed for mild pain.   03/22/2023   amiodarone (PACERONE) 200 MG tablet Take 1 tablet (200 mg total) by mouth daily. Do NOT take Amiodarone on Wednesday or Sunday. 30  tablet 11 03/22/2023   amoxicillin (AMOXIL) 500 MG capsule TAKE 4 CAPSULES BY MOUTH 30 MINUTES PRIOR TO DENTAL WORK 4 capsule 2 unknown   furosemide (LASIX) 20 MG tablet Take 3 tablets (60 mg total) by mouth 2 (two) times daily. 180 tablet 8 03/22/2023   HYDROcodone-acetaminophen (NORCO) 5-325 MG tablet Take 1 tablet by mouth every 6 (six) hours as needed for moderate pain. 15 tablet 0 unknown   levothyroxine (SYNTHROID) 88 MCG tablet TAKE 1 TABLET BY MOUTH EVERY DAY 90 tablet 0 03/22/2023   melatonin 5 MG TABS Take 5 mg by mouth at bedtime.   03/22/2023   metoprolol succinate (TOPROL-XL) 25 MG 24 hr tablet Take 0.5 tablets (12.5 mg total) by mouth daily. 90 tablet 3 03/22/2023 at 2200   Multiple Vitamin (MULTIVITAMIN WITH MINERALS) TABS tablet Take 1 tablet by mouth in the morning.   03/22/2023   mupirocin ointment (BACTROBAN) 2 % Apply 1 Application topically daily. 30 g 2 Past Month   nitroGLYCERIN (NITROSTAT) 0.4 MG SL tablet Place 1 tablet (0.4 mg total) under the tongue every 5 (five) minutes x 3 doses as needed for chest pain. 25 tablet 2 unknown   quiniDINE gluconate 324 MG CR tablet TAKE 1 TABLET BY MOUTH 2 TIMES DAILY. 180 tablet 3 03/22/2023   spironolactone (ALDACTONE) 25 MG tablet TAKE 1/2 TABLET  BY MOUTH EVERY DAY 45 tablet 3 03/22/2023   valsartan (DIOVAN) 40 MG tablet Take 40 mg by mouth daily.   03/22/2023   warfarin (COUMADIN) 5 MG tablet TAKE AS DIRECTED BY THE COUMADIN CLINIC (Patient taking differently: Take 2.5-5 mg by mouth See admin instructions. Take 5 mg by mouth on Mon., Wed., and Friday in the evening, all the other days take 2.5 mg by mouth in the evening TAKE AS DIRECTED BY THE COUMADIN CLINIC) 80 tablet 1 03/22/2023 at 2200   zinc gluconate 50 MG tablet Take 1 tablet (50 mg total) by mouth daily. 90 tablet 1 03/22/2023   enoxaparin (LOVENOX) 80 MG/0.8ML injection Inject 0.8 mLs (80 mg total) into the skin every 12 (twelve) hours. (Patient not taking: Reported on 03/23/2023) 16 mL 1  Not Taking   Scheduled:  Infusions:   Assessment: 82 yo male presenting with hematoma on warfarin PTA for afib with a history of mechanical MVR (goal INR 3-3.5). Home dose is 5 mg M/W/F and 2.5 mg all other days. Patient underwent I&D of L chest wall hematoma on 8/30. Heparin and warfarin have been on hold for 3 days post-op. He did have an acute drop in Hgb from 8.2 > 7.7 on 9/1, thus delaying initiation of heparin. Hgb today is back up to 8.5 s/p PRBC. PLTs at 161. No new bleeding noted. Last heparin infusion dose noted at 1000 units/hr.   Goal of Therapy:  Heparin level 0.3-0.7 Monitor platelets by anticoagulation protocol: Yes   Plan:  - No heparin bolus per vascular surgery - Initiate heparin at 1000 units/hr  - Obtain heparin level in 8 hours and daily while on heparin - Obtain daily CBC while on heparin - Follow-up plans to return to OR - Follow-up transition back to warfarin  Lennie Muckle, PharmD PGY1 Pharmacy Resident 03/28/2023 10:21 AM

## 2023-03-28 NOTE — Progress Notes (Signed)
Patient refused CPAP for the night  

## 2023-03-28 NOTE — Plan of Care (Signed)

## 2023-03-29 ENCOUNTER — Encounter (HOSPITAL_COMMUNITY)
Admission: EM | Disposition: A | Discharge status: Home Health | Payer: Self-pay | Source: Home / Self Care | Attending: Internal Medicine

## 2023-03-29 ENCOUNTER — Encounter (HOSPITAL_COMMUNITY): Payer: Self-pay | Admitting: Internal Medicine

## 2023-03-29 ENCOUNTER — Inpatient Hospital Stay (HOSPITAL_COMMUNITY): Payer: Medicare Other

## 2023-03-29 ENCOUNTER — Other Ambulatory Visit: Payer: Self-pay

## 2023-03-29 DIAGNOSIS — I5022 Chronic systolic (congestive) heart failure: Secondary | ICD-10-CM | POA: Diagnosis not present

## 2023-03-29 DIAGNOSIS — S2020XA Contusion of thorax, unspecified, initial encounter: Secondary | ICD-10-CM | POA: Diagnosis not present

## 2023-03-29 DIAGNOSIS — T888XXA Other specified complications of surgical and medical care, not elsewhere classified, initial encounter: Secondary | ICD-10-CM | POA: Diagnosis not present

## 2023-03-29 DIAGNOSIS — I4891 Unspecified atrial fibrillation: Secondary | ICD-10-CM

## 2023-03-29 DIAGNOSIS — N1832 Chronic kidney disease, stage 3b: Secondary | ICD-10-CM

## 2023-03-29 HISTORY — PX: INCISION AND DRAINAGE OF WOUND: SHX1803

## 2023-03-29 LAB — CBC
HCT: 24.2 % — ABNORMAL LOW (ref 39.0–52.0)
Hemoglobin: 7.6 g/dL — ABNORMAL LOW (ref 13.0–17.0)
MCH: 30.2 pg (ref 26.0–34.0)
MCHC: 31.4 g/dL (ref 30.0–36.0)
MCV: 96 fL (ref 80.0–100.0)
Platelets: 140 10*3/uL — ABNORMAL LOW (ref 150–400)
RBC: 2.52 MIL/uL — ABNORMAL LOW (ref 4.22–5.81)
RDW: 18.5 % — ABNORMAL HIGH (ref 11.5–15.5)
WBC: 9.3 10*3/uL (ref 4.0–10.5)
nRBC: 0.2 % (ref 0.0–0.2)

## 2023-03-29 LAB — BASIC METABOLIC PANEL
Anion gap: 9 (ref 5–15)
BUN: 39 mg/dL — ABNORMAL HIGH (ref 8–23)
CO2: 24 mmol/L (ref 22–32)
Calcium: 8.7 mg/dL — ABNORMAL LOW (ref 8.9–10.3)
Chloride: 100 mmol/L (ref 98–111)
Creatinine, Ser: 1.63 mg/dL — ABNORMAL HIGH (ref 0.61–1.24)
GFR, Estimated: 42 mL/min — ABNORMAL LOW (ref >60)
Glucose, Bld: 91 mg/dL (ref 70–99)
Potassium: 4.6 mmol/L (ref 3.5–5.1)
Sodium: 133 mmol/L — ABNORMAL LOW (ref 135–145)

## 2023-03-29 LAB — PROTIME-INR
INR: 1.1 (ref 0.8–1.2)
Prothrombin Time: 14.6 s (ref 11.4–15.2)

## 2023-03-29 LAB — HEPARIN LEVEL (UNFRACTIONATED): Heparin Unfractionated: 0.26 [IU]/mL — ABNORMAL LOW (ref 0.30–0.70)

## 2023-03-29 SURGERY — IRRIGATION AND DEBRIDEMENT WOUND
Anesthesia: Monitor Anesthesia Care | Site: Chest

## 2023-03-29 MED ORDER — LIDOCAINE 2% (20 MG/ML) 5 ML SYRINGE
INTRAMUSCULAR | Status: DC | PRN
  Administered 2023-03-29: 60 mg via INTRAVENOUS

## 2023-03-29 MED ORDER — ORAL CARE MOUTH RINSE: 15.0000 mL | Freq: Once | OROMUCOSAL | Status: AC

## 2023-03-29 MED ORDER — CHLORHEXIDINE GLUCONATE 0.12 % MT SOLN: 15.0000 mL | Freq: Once | OROMUCOSAL | Status: AC

## 2023-03-29 MED ORDER — FENTANYL CITRATE (PF) 250 MCG/5ML IJ SOLN
INTRAMUSCULAR | Status: AC
  Filled 2023-03-29: qty 5

## 2023-03-29 MED ORDER — ONDANSETRON HCL 4 MG/2ML IJ SOLN: 4.0000 mg | Freq: Once | INTRAMUSCULAR | Status: DC | PRN

## 2023-03-29 MED ORDER — PHENYLEPHRINE HCL-NACL 20-0.9 MG/250ML-% IV SOLN
INTRAVENOUS | Status: DC | PRN
  Administered 2023-03-29: 10 ug/min via INTRAVENOUS

## 2023-03-29 MED ORDER — CHLORHEXIDINE GLUCONATE 0.12 % MT SOLN
OROMUCOSAL | Status: AC
  Administered 2023-03-29: 15 mL via OROMUCOSAL
  Filled 2023-03-29: qty 15

## 2023-03-29 MED ORDER — CEFAZOLIN SODIUM-DEXTROSE 2-3 GM-%(50ML) IV SOLR
INTRAVENOUS | Status: DC | PRN
Start: 2023-03-29 — End: 2023-03-29
  Administered 2023-03-29: 2 g via INTRAVENOUS

## 2023-03-29 MED ORDER — LACTATED RINGERS IV SOLN: INTRAVENOUS | Status: DC

## 2023-03-29 MED ORDER — FENTANYL CITRATE (PF) 250 MCG/5ML IJ SOLN
INTRAMUSCULAR | Status: DC | PRN
  Administered 2023-03-29 (×2): 25 ug via INTRAVENOUS

## 2023-03-29 MED ORDER — LIDOCAINE 2% (20 MG/ML) 5 ML SYRINGE
INTRAMUSCULAR | Status: AC
  Filled 2023-03-29: qty 5

## 2023-03-29 MED ORDER — ACETAMINOPHEN 10 MG/ML IV SOLN: 1000.0000 mg | Freq: Once | INTRAVENOUS | Status: DC | PRN

## 2023-03-29 MED ORDER — MIDAZOLAM HCL 2 MG/2ML IJ SOLN
INTRAMUSCULAR | Status: AC
  Filled 2023-03-29: qty 2

## 2023-03-29 MED ORDER — LIDOCAINE HCL (PF) 1 % IJ SOLN
INTRAMUSCULAR | Status: AC
  Filled 2023-03-29: qty 30

## 2023-03-29 MED ORDER — ETOMIDATE 2 MG/ML IV SOLN
INTRAVENOUS | Status: DC | PRN
Start: 2023-03-29 — End: 2023-03-29
  Administered 2023-03-29 (×2): 2 mg via INTRAVENOUS

## 2023-03-29 MED ORDER — OXYCODONE HCL 5 MG PO TABS: 5.0000 mg | ORAL_TABLET | ORAL | Status: DC | PRN

## 2023-03-29 MED ORDER — 0.9 % SODIUM CHLORIDE (POUR BTL) OPTIME
TOPICAL | Status: DC | PRN
  Administered 2023-03-29: 1000 mL

## 2023-03-29 MED ORDER — AMISULPRIDE (ANTIEMETIC) 5 MG/2ML IV SOLN: 10.0000 mg | Freq: Once | INTRAVENOUS | Status: DC | PRN

## 2023-03-29 MED ORDER — PROPOFOL 10 MG/ML IV BOLUS
INTRAVENOUS | Status: AC
  Filled 2023-03-29: qty 20

## 2023-03-29 MED ORDER — FENTANYL CITRATE (PF) 100 MCG/2ML IJ SOLN: 25.0000 ug | INTRAMUSCULAR | Status: DC | PRN

## 2023-03-29 MED ORDER — PROPOFOL 500 MG/50ML IV EMUL
INTRAVENOUS | Status: DC | PRN
Start: 2023-03-29 — End: 2023-03-29
  Administered 2023-03-29: 25 ug/kg/min via INTRAVENOUS

## 2023-03-29 MED ORDER — MIDAZOLAM HCL 2 MG/2ML IJ SOLN
INTRAMUSCULAR | Status: DC | PRN
  Administered 2023-03-29: 1 mg via INTRAVENOUS

## 2023-03-29 SURGICAL SUPPLY — 50 items
BAG COUNTER SPONGE SURGICOUNT (BAG) ×1 IMPLANT
BAG SPNG CNTER NS LX DISP (BAG) ×1
BNDG ELASTIC 4X5.8 VLCR STR LF (GAUZE/BANDAGES/DRESSINGS) IMPLANT
BNDG ELASTIC 6X5.8 VLCR STR LF (GAUZE/BANDAGES/DRESSINGS) IMPLANT
BNDG GAUZE DERMACEA FLUFF 4 (GAUZE/BANDAGES/DRESSINGS) IMPLANT
BNDG GZE DERMACEA 4 6PLY (GAUZE/BANDAGES/DRESSINGS)
CANISTER SUCT 3000ML PPV (MISCELLANEOUS) ×1 IMPLANT
CANISTER WOUNDNEG PRESSURE 500 (CANNISTER) IMPLANT
CLIP TI MEDIUM 6 (CLIP) ×1 IMPLANT
CLIP TI WIDE RED SMALL 6 (CLIP) ×1 IMPLANT
COVER SURGICAL LIGHT HANDLE (MISCELLANEOUS) ×1 IMPLANT
DRAPE CHEST BREAST 15X10 FENES (DRAPES) IMPLANT
DRAPE HALF SHEET 40X57 (DRAPES) IMPLANT
DRAPE U-SHAPE 76X120 STRL (DRAPES) IMPLANT
DRSG VAC GRANUFOAM SM (GAUZE/BANDAGES/DRESSINGS) IMPLANT
DRSG VERSA FOAM LRG 10X15 (GAUZE/BANDAGES/DRESSINGS) IMPLANT
ELECT REM PT RETURN 9FT ADLT (ELECTROSURGICAL) ×1
ELECTRODE REM PT RTRN 9FT ADLT (ELECTROSURGICAL) ×1 IMPLANT
GAUZE SPONGE 4X4 12PLY STRL (GAUZE/BANDAGES/DRESSINGS) ×1 IMPLANT
GAUZE XEROFORM 5X9 LF (GAUZE/BANDAGES/DRESSINGS) IMPLANT
GLOVE SURG SS PI 7.5 STRL IVOR (GLOVE) ×3 IMPLANT
GOWN STRL REUS W/ TWL LRG LVL3 (GOWN DISPOSABLE) ×2 IMPLANT
GOWN STRL REUS W/ TWL XL LVL3 (GOWN DISPOSABLE) ×1 IMPLANT
GOWN STRL REUS W/TWL LRG LVL3 (GOWN DISPOSABLE) ×2
GOWN STRL REUS W/TWL XL LVL3 (GOWN DISPOSABLE) ×1
GRAFT SKIN WND MICRO 38 (Tissue) IMPLANT
HANDPIECE INTERPULSE COAX TIP (DISPOSABLE)
IV NS IRRIG 3000ML ARTHROMATIC (IV SOLUTION) ×1 IMPLANT
KIT BASIN OR (CUSTOM PROCEDURE TRAY) ×1 IMPLANT
KIT TURNOVER KIT B (KITS) ×1 IMPLANT
NDL 18GX1X1/2 (RX/OR ONLY) (NEEDLE) IMPLANT
NEEDLE 18GX1X1/2 (RX/OR ONLY) (NEEDLE) ×1 IMPLANT
NS IRRIG 1000ML POUR BTL (IV SOLUTION) ×1 IMPLANT
PACK CV ACCESS (CUSTOM PROCEDURE TRAY) IMPLANT
PACK GENERAL/GYN (CUSTOM PROCEDURE TRAY) ×1 IMPLANT
PACK UNIVERSAL I (CUSTOM PROCEDURE TRAY) ×1 IMPLANT
PAD ARMBOARD 7.5X6 YLW CONV (MISCELLANEOUS) ×2 IMPLANT
PULSAVAC PLUS IRRIG FAN TIP (DISPOSABLE)
SET HNDPC FAN SPRY TIP SCT (DISPOSABLE) IMPLANT
SUT ETHIBOND X763 2 0 SH 1 (SUTURE) IMPLANT
SUT ETHILON 3 0 PS 1 (SUTURE) IMPLANT
SUT VIC AB 2-0 CTX 36 (SUTURE) IMPLANT
SUT VIC AB 3-0 SH 27 (SUTURE) ×1
SUT VIC AB 3-0 SH 27X BRD (SUTURE) IMPLANT
SUT VIC AB 4-0 PS2 18 (SUTURE) IMPLANT
SUT VICRYL 4-0 PS2 18IN ABS (SUTURE) IMPLANT
SYR CONTROL 10ML LL (SYRINGE) IMPLANT
TIP FAN IRRIG PULSAVAC PLUS (DISPOSABLE) IMPLANT
TOWEL GREEN STERILE (TOWEL DISPOSABLE) ×1 IMPLANT
WATER STERILE IRR 1000ML POUR (IV SOLUTION) ×1 IMPLANT

## 2023-03-29 NOTE — Progress Notes (Signed)
ANTICOAGULATION CONSULT NOTE   Pharmacy Consult for heparin  Indication:  afib with a mechanical MVR  No Known Allergies  Patient Measurements: Height: 6\' 1"  (185.4 cm) Weight: 94.3 kg (208 lb) IBW/kg (Calculated) : 79.9 Heparin Dosing Weight: 89.9 kg  Vital Signs: Temp: 98.5 F (36.9 C) (09/03 1945) Temp Source: Oral (09/03 1945) BP: 122/66 (09/03 1945) Pulse Rate: 61 (09/03 1948)  Labs: Recent Labs    03/27/23 0502 03/28/23 0306 03/28/23 2032 03/29/23 0535  HGB 7.7* 8.2*  --  7.6*  HCT 23.6* 26.3*  --  24.2*  PLT 126* 161  --  140*  LABPROT 19.8*  --   --  14.6  INR 1.7*  --   --  1.1  HEPARINUNFRC  --   --  0.16* 0.26*  CREATININE 2.33*  --   --  1.63*    Estimated Creatinine Clearance: 40.8 mL/min (A) (by C-G formula based on SCr of 1.63 mg/dL (H)).   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   Conductive hearing loss    uses hearing aids   Hypothyroidism    Intraspinal abscess    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   Nonischemic cardiomyopathy (HCC)    Psychosexual dysfunction with inhibited sexual excitement    S/P mitral valve replacement    Syncope and collapse    Unspecified sleep apnea    last sleep study 11/07, uses cpap   Ventricular tachycardia (HCC)    VT (ventricular tachycardia) (HCC)     Medications:  Medications Prior to Admission  Medication Sig Dispense Refill Last Dose   acetaminophen (TYLENOL) 500 MG tablet Take 1,000 mg by mouth every 6 (six) hours as needed for mild pain.   03/22/2023   amiodarone (PACERONE) 200 MG tablet Take 1 tablet (200 mg total) by mouth daily. Do NOT take Amiodarone on  Wednesday or Sunday. 30 tablet 11 03/22/2023   amoxicillin (AMOXIL) 500 MG capsule TAKE 4 CAPSULES BY MOUTH 30 MINUTES PRIOR TO DENTAL WORK 4 capsule 2 unknown   furosemide (LASIX) 20 MG tablet Take 3 tablets (60 mg total) by mouth 2 (two) times daily. 180 tablet 8 03/22/2023   HYDROcodone-acetaminophen (NORCO) 5-325 MG tablet Take 1 tablet by mouth every 6 (six) hours as needed for moderate pain. 15 tablet 0 unknown   levothyroxine (SYNTHROID) 88 MCG tablet TAKE 1 TABLET BY MOUTH EVERY DAY 90 tablet 0 03/22/2023   melatonin 5 MG TABS Take 5 mg by mouth at bedtime.   03/22/2023   metoprolol succinate (TOPROL-XL) 25 MG 24 hr tablet Take 0.5 tablets (12.5 mg total) by mouth daily. 90 tablet 3 03/22/2023 at 2200   Multiple Vitamin (MULTIVITAMIN WITH MINERALS) TABS tablet Take 1 tablet by mouth in the morning.   03/22/2023   mupirocin ointment (BACTROBAN) 2 % Apply 1 Application topically daily. 30 g 2 Past Month   nitroGLYCERIN (NITROSTAT) 0.4 MG SL tablet Place 1 tablet (0.4 mg total) under the tongue every 5 (five) minutes x 3 doses as needed for chest pain. 25 tablet 2 unknown   quiniDINE gluconate 324 MG CR tablet TAKE 1 TABLET BY MOUTH 2 TIMES DAILY. 180 tablet  3 03/22/2023   spironolactone (ALDACTONE) 25 MG tablet TAKE 1/2 TABLET BY MOUTH EVERY DAY 45 tablet 3 03/22/2023   valsartan (DIOVAN) 40 MG tablet Take 40 mg by mouth daily.   03/22/2023   warfarin (COUMADIN) 5 MG tablet TAKE AS DIRECTED BY THE COUMADIN CLINIC (Patient taking differently: Take 2.5-5 mg by mouth See admin instructions. Take 5 mg by mouth on Mon., Wed., and Friday in the evening, all the other days take 2.5 mg by mouth in the evening TAKE AS DIRECTED BY THE COUMADIN CLINIC) 80 tablet 1 03/22/2023 at 2200   zinc gluconate 50 MG tablet Take 1 tablet (50 mg total) by mouth daily. 90 tablet 1 03/22/2023   enoxaparin (LOVENOX) 80 MG/0.8ML injection Inject 0.8 mLs (80 mg total) into the skin every 12 (twelve) hours. (Patient not taking:  Reported on 03/23/2023) 16 mL 1 Not Taking   Scheduled:  Infusions:   Assessment: 82 yo male presenting with hematoma on warfarin PTA for afib with a history of mechanical MVR (goal INR 3-3.5).  Patient underwent I&D of L chest wall hematoma on 8/30 where heparin and warfarin were held 3 days post-op.  He did have an acute drop in Hgb from 8.2 > 7.7 on 9/1, thus delaying initiation of heparin.    Heparin level 0.26 this morning on 1150 units/hr.  Rate increased to 1250 units/hr and infusion was paused @1337  for I&D of chest wall 9/3.  Hgb 7.6, platelets 140.  Warfarin PTA dose: 5 mg MWF, 2.5 mg AOD  Goal of Therapy:  Heparin level 0.3-0.7 Monitor platelets by anticoagulation protocol: Yes   Plan:  - Restart heparin at 1250 units/hr  - Obtain heparin level in AM (~8h after restart) and daily while on heparin - Obtain daily CBC while on heparin - Follow-up transition back to warfarin  Trixie Rude, PharmD Clinical Pharmacist 03/29/2023  8:28 PM

## 2023-03-29 NOTE — Interval H&P Note (Signed)
History and Physical Interval Note:  03/29/2023 1:39 PM  Austin [name]  has presented today for surgery, with the diagnosis of Fluid collection at surgical site.  The various methods of treatment have been discussed with the patient and family. After consideration of risks, benefits and other options for treatment, the patient has consented to  Procedure(s): IRRIGATION AND DEBRIDEMENT  CHEST WAL (N/A) as a surgical intervention.  The patient's history has been reviewed, patient examined, no change in status, stable for surgery.  I have reviewed the patient's chart and labs.  Questions were answered to the patient's satisfaction.     Durene Cal

## 2023-03-29 NOTE — Progress Notes (Signed)
Pt returned from OR. Called pharmacy to confirm if heparin drip is to started back. Pharmacy will return call.

## 2023-03-29 NOTE — Progress Notes (Signed)
Patient is from [facility]. TOC will continue to follow and assist with discharge planning needs.

## 2023-03-29 NOTE — Progress Notes (Signed)
Per Dr. Bradley Ferris, [employer] Rep doesn't need to be here for surgery at this time.

## 2023-03-29 NOTE — Progress Notes (Signed)
CCC Pre-op Review  Pre-op checklist:  ASKED RN TO complete & have consent signed after PA spoke w/ pt  NPO:  HAS BEEN NPO  Labs:  creat/bun 1.63/39   h/h 7.6/24.2  no plans to transfuse per rn  Consent:  see above  H&P:  done   Vitals:  none documented since 0348  95/56 hr 62 rr 20  O2 requirements: on RA  MAR/PTA review: heparin gtt currently / ancef due 1100 / amiodarone due 1000  IV:  2 in place  Floor nurse name:  Hinton Dyer [phone]   Additional info:

## 2023-03-29 NOTE — Plan of Care (Signed)

## 2023-03-29 NOTE — H&P (View-Only) (Signed)
  Progress Note    03/29/2023 7:48 AM 4 Days Post-Op  Subjective:  no complaints   Vitals:   03/29/23 0348 03/29/23 0400  BP: (!) 95/56   Pulse: 62   Resp: 20   Temp: 97.6 F (36.4 C)   SpO2: 96% 97%   Physical Exam: Lungs:  non labored Incisions:  L chest with vac with good seal Neurologic: A&O  CBC    Component Value Date/Time   WBC 9.3 03/29/2023 0535   RBC 2.52 (L) 03/29/2023 0535   HGB 7.6 (L) 03/29/2023 0535   HGB 16.6 12/04/2020 1445   HCT 24.2 (L) 03/29/2023 0535   HCT 50.1 12/04/2020 1445   PLT 140 (L) 03/29/2023 0535   PLT 158 12/04/2020 1445   MCV 96.0 03/29/2023 0535   MCV 94 12/04/2020 1445   MCH 30.2 03/29/2023 0535   MCHC 31.4 03/29/2023 0535   RDW 18.5 (H) 03/29/2023 0535   RDW 13.7 12/04/2020 1445   LYMPHSABS 0.4 (L) 03/23/2023 1018   MONOABS 1.0 03/23/2023 1018   EOSABS 0.1 03/23/2023 1018   BASOSABS 0.0 03/23/2023 1018    BMET    Component Value Date/Time   NA 133 (L) 03/29/2023 0535   NA 139 01/29/2021 0853   K 4.6 03/29/2023 0535   CL 100 03/29/2023 0535   CO2 24 03/29/2023 0535   GLUCOSE 91 03/29/2023 0535   BUN 39 (H) 03/29/2023 0535   BUN 18 01/29/2021 0853   CREATININE 1.63 (H) 03/29/2023 0535   CREATININE 1.22 (H) 09/12/2015 0846   CALCIUM 8.7 (L) 03/29/2023 0535   GFRNONAA 42 (L) 03/29/2023 0535   GFRAA 56 (L) 04/30/2020 1144    INR    Component Value Date/Time   INR 1.1 03/29/2023 0535     Intake/Output Summary (Last 24 hours) at 03/29/2023 0748 Last data filed at 03/29/2023 0429 Gross per 24 hour  Intake 1015.22 ml  Output 2205 ml  Net -1189.78 ml     Assessment/Plan:  82 y.o. male is s/p L sided barostim 4 Days Post-Op   Plan is for return to OR today for washout and drain placement.  All questions answered and he is willing to proceed.  Continue npo.  Consent ordered    [name], PA-C [facility] [phone] 03/29/2023 7:48 AM

## 2023-03-29 NOTE — Progress Notes (Signed)
Anesthesia asked for [employer] to be called to turn off defibrillator. Shari Prows, [employer] rep notified. Stated he would send a rep here at this time.

## 2023-03-29 NOTE — Plan of Care (Signed)
  Problem: Education: Goal: Knowledge of General Education information will improve Description: Including pain rating scale, medication(s)/side effects and non-pharmacologic comfort measures 03/29/2023 2226 by Royetta Crochet, RN Outcome: Progressing 03/29/2023 2226 by Royetta Crochet, RN Outcome: Progressing   Problem: Health Behavior/Discharge Planning: Goal: Ability to manage health-related needs will improve 03/29/2023 2226 by Royetta Crochet, RN Outcome: Progressing 03/29/2023 2226 by Royetta Crochet, RN Outcome: Progressing   Problem: Clinical Measurements: Goal: Ability to maintain clinical measurements within normal limits will improve 03/29/2023 2226 by Royetta Crochet, RN Outcome: Progressing 03/29/2023 2226 by Royetta Crochet, RN Outcome: Progressing Goal: Will remain free from infection 03/29/2023 2226 by Royetta Crochet, RN Outcome: Progressing 03/29/2023 2226 by Royetta Crochet, RN Outcome: Progressing Goal: Diagnostic test results will improve 03/29/2023 2226 by Royetta Crochet, RN Outcome: Progressing 03/29/2023 2226 by Royetta Crochet, RN Outcome: Progressing Goal: Respiratory complications will improve 03/29/2023 2226 by Royetta Crochet, RN Outcome: Progressing 03/29/2023 2226 by Royetta Crochet, RN Outcome: Progressing Goal: Cardiovascular complication will be avoided 03/29/2023 2226 by Royetta Crochet, RN Outcome: Progressing 03/29/2023 2226 by Royetta Crochet, RN Outcome: Progressing   Problem: Activity: Goal: Risk for activity intolerance will decrease 03/29/2023 2226 by Royetta Crochet, RN Outcome: Progressing 03/29/2023 2226 by Royetta Crochet, RN Outcome: Progressing   Problem: Nutrition: Goal: Adequate nutrition will be maintained 03/29/2023 2226 by Royetta Crochet, RN Outcome: Progressing 03/29/2023 2226 by Royetta Crochet, RN Outcome: Progressing   Problem: Coping: Goal: Level of anxiety will decrease 03/29/2023 2226 by Royetta Crochet, RN Outcome:  Progressing 03/29/2023 2226 by Royetta Crochet, RN Outcome: Progressing   Problem: Elimination: Goal: Will not experience complications related to bowel motility 03/29/2023 2226 by Royetta Crochet, RN Outcome: Progressing 03/29/2023 2226 by Royetta Crochet, RN Outcome: Progressing Goal: Will not experience complications related to urinary retention 03/29/2023 2226 by Royetta Crochet, RN Outcome: Progressing 03/29/2023 2226 by Royetta Crochet, RN Outcome: Progressing   Problem: Pain Managment: Goal: General experience of comfort will improve 03/29/2023 2226 by Royetta Crochet, RN Outcome: Progressing 03/29/2023 2226 by Royetta Crochet, RN Outcome: Progressing   Problem: Safety: Goal: Ability to remain free from injury will improve 03/29/2023 2226 by Royetta Crochet, RN Outcome: Progressing 03/29/2023 2226 by Royetta Crochet, RN Outcome: Progressing   Problem: Skin Integrity: Goal: Risk for impaired skin integrity will decrease 03/29/2023 2226 by Royetta Crochet, RN Outcome: Progressing 03/29/2023 2226 by Royetta Crochet, RN Outcome: Progressing

## 2023-03-29 NOTE — Progress Notes (Signed)
ANTICOAGULATION CONSULT NOTE - Follow Up Consult  Pharmacy Consult for heparin Indication:  MVR/Afib  Labs: Recent Labs    03/27/23 0502 03/28/23 0306 03/28/23 2032 03/29/23 0535  HGB 7.7* 8.2*  --  7.6*  HCT 23.6* 26.3*  --  24.2*  PLT 126* 161  --  140*  LABPROT 19.8*  --   --   --   INR 1.7*  --   --   --   HEPARINUNFRC  --   --  0.16* 0.26*  CREATININE 2.33*  --   --   --     Assessment: 82yo male subtherapeutic on heparin after rate change though approaching goal; no infusion issues or signs of bleeding per RN, Hgb low but stable.  Goal of Therapy:  Heparin level 0.3-0.7 units/ml   Plan:  Increase heparin infusion by ~1 unit/kg/hr to 1250 units/hr. Check level in 8 hours.   Vernard Gambles, PharmD, BCPS 03/29/2023 6:27 AM

## 2023-03-29 NOTE — CV Procedure (Signed)
    Patient name: Austin [name] MRN: [id number] DOB: 28-Jan-1941 Sex: male  03/29/2023 Pre-operative Diagnosis: left chest seroma Post-operative diagnosis:  Same Surgeon:  Durene Cal Procedure:   #1:  Debridement of left chest wound   #2:  Placement of first 38 cm of skin substitute (Kerecis)   #3:  Placement of wound vac Anesthesia:  MAC Blood Loss:  minimal Specimens:  none  Findings: Recurrent seroma within left chest wall.  This was evacuated, approximately 600 cc of fluid.  Some hematoma was also removed.  I meticulously explored the wound and did not see any obvious source of lymphatic drainage.  I cauterized all raw surface areas.  Kerecis was placed in the wound.  I then placed a white sponge between the separated fibers of the pectoralis muscle and then placed a black spot over top.  Indications: This is an 82 year old gentleman who has recently undergone placement of a left-sided Barostim device.  He came in with significant swelling and was taken to the operating room.  He was found to have a large seroma.  This was drained.  Antibiotic beads and Kerecis was placed followed by wound VAC.  Unfortunately he has reaccumulated the fluid and comes back today for exploration.     Procedure:  The patient was identified in the holding area and taken to [facility] OR ROOM 16  The patient was then placed supine on the table. MAC anesthesia was administered.  The patient was prepped and draped in the usual sterile fashion.  A time out was called and antibiotics were administered.  The previous wound VAC was removed.  The tissue over top of the pectoralis muscle was viable.  There was no significant fluid here.  I then separated the fibers of the pectoralis muscle and immediately drained off approximately 600 cc of blood-tinged fluid that was mostly seroma.  At the end explored the wound.  I did remove some hematoma but nothing impressive.  There was no active bleeding.  I meticulously explored  the wound looking for a lymphatic source.  I did not see any.  I then cauterized all raw surface areas that I could get to with the Bovie.  I then placed 38 cm of Kerecis powder into the wound.  I then placed a white sponge in between the pectoralis muscles.  I did try to cover the generator with muscle to protected.  I then placed a black sponge over top of the white sponge and connected to suction.   Disposition: To PACU stable   V. Durene Cal, M.D., [facility] [place] Office: [phone] Pager:  [phone]

## 2023-03-29 NOTE — Anesthesia Preprocedure Evaluation (Addendum)
Anesthesia Evaluation  Patient identified by MRN, date of birth, ID band Patient awake    Reviewed: Allergy & Precautions, NPO status , Patient's Chart, lab work & pertinent test results  Airway Mallampati: II  TM Distance: >3 FB Neck ROM: Full    Dental no notable dental hx.    Pulmonary sleep apnea and Continuous Positive Airway Pressure Ventilation    Pulmonary exam normal        Cardiovascular +CHF  Normal cardiovascular exam+ dysrhythmias Atrial Fibrillation + Cardiac Defibrillator   ECHO:  1. Left ventricular ejection fraction, by estimation, is 20 to 25%. The  left ventricle has severely decreased function. The left ventricle  demonstrates global hypokinesis. The left ventricular internal cavity size  was mildly to moderately dilated. Left  ventricular diastolic parameters are indeterminate. No LV thrombus.   2. Right ventricular systolic function is mildly reduced. The right  ventricular size is normal. There is mildly elevated pulmonary artery  systolic pressure. The estimated right ventricular systolic pressure is  42.0 mmHg.   3. Left atrial size was moderately dilated.   4. Right atrial size was mildly dilated.   5. Mechanical mitral valve with mean gradient 4, no significant mitral  regurgitation noted. Function appears normal.   6. The tricuspid valve is abnormal. Tricuspid valve regurgitation is  moderate.   7. The aortic valve is tricuspid. There is mild calcification of the  aortic valve. Aortic valve regurgitation is not visualized. No aortic  stenosis is present.   8. Aortic dilatation noted. There is mild dilatation of the aortic root,  measuring 39 mm.   9. The inferior vena cava is dilated in size with <50% respiratory  variability, suggesting right atrial pressure of 15 mmHg.     Neuro/Psych negative neurological ROS     GI/Hepatic negative GI ROS, Neg liver ROS,,,  Endo/Other  Hypothyroidism     Renal/GU CRFRenal disease     Musculoskeletal negative musculoskeletal ROS (+)    Abdominal   Peds  Hematology  (+) Blood dyscrasia, anemia   Anesthesia Other Findings Fluid collection at surgical site  Reproductive/Obstetrics                             Anesthesia Physical Anesthesia Plan  ASA: 4  Anesthesia Plan: MAC   Post-op Pain Management:    Induction: Intravenous  PONV Risk Score and Plan: 2 and Ondansetron, Dexamethasone and Treatment may vary due to age or medical condition  Airway Management Planned: LMA  Additional Equipment:   Intra-op Plan:   Post-operative Plan: Extubation in OR  Informed Consent: I have reviewed the patients History and Physical, chart, labs and discussed the procedure including the risks, benefits and alternatives for the proposed anesthesia with the patient or authorized representative who has indicated his/her understanding and acceptance.     Dental advisory given  Plan Discussed with: CRNA and Surgeon  Anesthesia Plan Comments:         Anesthesia Quick Evaluation

## 2023-03-29 NOTE — Transfer of Care (Signed)
Immediate Anesthesia Transfer of Care Note  Patient: Austin [name]  Procedure(s) Performed: IRRIGATION AND DEBRIDEMENT  CHEST WALL (Chest)  Patient Location: PACU  Anesthesia Type:MAC  Level of Consciousness: awake and alert   Airway & Oxygen Therapy: Patient Spontanous Breathing and Patient connected to face mask oxygen  Post-op Assessment: Report given to RN and Post -op Vital signs reviewed and stable  Post vital signs: Reviewed and stable  Last Vitals:  Vitals Value Taken Time  BP 109/67 03/29/23 1921  Temp    Pulse 60 03/29/23 1923  Resp 20 03/29/23 1923  SpO2 100 % 03/29/23 1923  Vitals shown include unfiled device data.  Last Pain:  Vitals:   03/29/23 1334  TempSrc:   PainSc: 3       Patients Stated Pain Goal: 0 (03/25/23 2005)  Complications: No notable events documented.

## 2023-03-29 NOTE — Progress Notes (Signed)
PROGRESS NOTE    Austin [name]  ZOX:[id number] DOB: 08-Sep-1940 DOA: 03/23/2023 PCP: [name], MD    Brief Narrative:   Austin [name] is a 82 y.o. 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  Progressive over the last 2 days with recent Barostim placement by vascular surgery on 03/17/2023 due to symptomatic advanced CHF.  Also endorses shortness of breath.  Denies fever, no chills, no abdominal pain, no nausea/vomiting/diarrhea.  In the ED, temperature 97.5 F, HR 61, RR 18, BP 82/62, SpO2 94% on room air.  WBC 9.6, hemoglobin 9.3, platelet count 151.  Sodium 135, potassium 4.4, chloride 97, CO2 28, glucose 131, BUN 48, creatinine 2.44.  BNP 352.5.  INR 2.0.  Chest x-ray with new stimulator implant left chest wall with prominent asymmetric soft tissue density surrounding the stimulator in the left chest wall concerning for hematoma, no acute cardiopulmonary disease process.  Vascular surgery was consulted.  Patient was given 500 mL IV fluid bolus, IV fentanyl/morphine, Zofran in the ED.  Warfarin was held and patient started on a heparin bridge given mechanical mitral valve.  TRH consulted for admission for further evaluation management of acute blood loss anemia secondary to hematoma surrounding recent surgical site  Assessment & Plan:   Acute blood loss anemia Left chest wall hematoma/seroma Patient presenting to ED with enlarging left chest region surrounding his surgical site associated with  chest discomfort and shortness of breath.  Recently underwent Barostim placement by vascular surgery, [name] on 03/17/2023 due to symptomatic events congestive heart failure.  Likely complicated by his need of chronic anticoagulation with Coumadin due to mechanical mitral valve.  Anemia panel with iron 60, TIBC 266, ferritin 144, folate 24.8, vitamin B12 537.  Vascular surgery was consulted and patient underwent I&D of left chest wall with placement of antibiotic beads, skin substitute, and wound VAC by [name] on 03/25/2023. -- Vascular surgery following, appreciate assistance -- Hgb 9.3>8.7>7.8>8.8>7.9>7.2>8.5>7.7>7.6>7.7>8.2>7.6 -- s/p 1 unit pRBC 8/29 and 8/30, 2u FFP 8/30; 1 u pRBC 8/31 -- CBC in am -- Continue to monitor wound VAC output -- N.p.o. for plan further I&D today by vascular surgery  Permanent atrial fibrillation/atypical flutter -- Continue amiodarone 200 mg daily on M/T/T/F/Sat -- Holding metoprolol given hypotension -- Started heparin drip 9/2 -- Monitor on telemetry  History of ventricular tachycardia -- Continue home quinidine, amiodarone  Hx complete heart block s/p PPM -- Continue monitor on telemetry  Chronic systolic congestive heart failure, compensated Follows with advanced heart failure clinic, last TTE with LVEF 20-25%, indeterminate diastolic dysfunction, mildly reduced RV function.  S/p AICD/PPM.  Recently underwent Bariostim implantation on 8/22 by vascular surgery for symptomatic advanced heart failure. -- Holding home metoprolol, valsartan, spironolactone, Lasix given hypotension -- Continue monitor BP closely -- Strict I's and O's and daily weights  Hx MV endocarditis s/p mechanical mitral valve replacement On Coumadin outpatient. -- Holding home Coumadin -- Started heparin drip today  CKD stage IIIb -- Cr 2.44>2.42>2.47>2.36>2.33>1.63; stable (baseline 2.2 - 2.7) -- Avoid nephrotoxins, renal dose all medications -- BMP  daily  Hypothyroidism -- Continue levothyroxine  Foot callus Follows with podiatry, [name] outpatient.  Pending outpatient MRI.  Outpatient follow-up with podiatry/vascular surgery.  OSA: --Continue nocturnal CPAP   DVT prophylaxis: Holding anticoagulants due to hematoma per vascular surgery recommendations    Code Status: Full Code Family Communication: No family present at bedside this morning  Disposition Plan:  Level of care: Progressive Status is: Inpatient Remains inpatient appropriate because: Needs close monitoring of left chest hematoma/seroma, plan further I&D today by vascular surgery    Consultants:  Vascular surgery  Procedures:   I&D of left chest wall with placement of antibiotic beads, skin substitute, and wound VAC by [name] on 03/25/2023 Repeat I&D planned today  Antimicrobials:  None   Subjective: Patient seen examined bedside, resting comfortably.  Lying in bed.  Continues with wound VAC in place, 55 mL out past 24 hours.  Plan further I&D today by vascular surgery.  No other specific complaints or concerns at this time. Patient denies headache, no fever/chills/night sweats, no visual changes, no abdominal pain, no current shortness of breath, no focal weakness, no fatigue, no paresthesias.  No acute events overnight per nursing staff.    Objective: Vitals:   03/28/23 2000 03/29/23 0000 03/29/23 0348 03/29/23 0400  BP:   (!) 95/56   Pulse:   62   Resp:   20   Temp:   97.6 F (36.4 C)   TempSrc:   Oral   SpO2: 98% 97% 96% 97%  Weight:    94.4 kg  Height:        Intake/Output Summary (Last 24 hours) at 03/29/2023 1112 Last data filed at 03/29/2023 0824 Gross per 24 hour  Intake 652.22 ml  Output 2045 ml  Net -1392.78 ml   Filed Weights   03/27/23 0350 03/28/23 0429 03/29/23 0400  Weight: 92.9 kg 93.9 kg 94.4 kg    Examination:  Physical Exam: GEN: NAD, alert and oriented x 3, wd/wn HEENT: NCAT, PERRL, EOMI, sclera clear,  MMM PULM: CTAB w/o wheezes/crackles, normal respiratory effort, on room air CV: RRR w/o M/G/R, left chest wall hematoma/seroma decreased in size with wound VAC in place  GI: abd soft, NTND, NABS, no R/G/M MSK: no peripheral edema, muscle strength globally intact 5/5 bilateral upper/lower extremities NEURO: CN II-XII intact, no focal deficits, sensation to light touch intact PSYCH: normal mood/affect Integumentary: Left chest wall wound VAC in place, right lateral foot callus as depicted below, otherwise no other concerning rashes/lesions/wounds noted on exposed skin surfaces.           Data Reviewed: I have personally reviewed following labs and imaging studies  CBC: Recent Labs  Lab 03/23/23 1018 03/23/23 1223 03/25/23 0351 03/26/23 0606 03/26/23 1334 03/27/23 0502 03/28/23 0306 03/29/23 0535  WBC 9.6   < > 15.7* 15.7*  --  11.6* 11.8* 9.3  NEUTROABS 7.8*  --   --   --   --   --   --   --   HGB 9.3*   < > 7.7* 7.6* 8.2* 7.7* 8.2* 7.6*  HCT 28.4*   < > 24.6* 23.8* 25.4* 23.6* 26.3* 24.2*  MCV 94.7   < > 94.6 97.1  --  94.4 96.3 96.0  PLT 151   < > 138* 131*  --  126* 161 140*   < > = values in this interval not displayed.   Basic Metabolic Panel: Recent Labs  Lab 03/23/23 2201 03/24/23 0503 03/25/23 0351 03/26/23 0606 03/27/23 0502 03/29/23  0535  NA  --  134* 134* 133* 135 133*  K  --  4.8 4.9 5.1 4.5 4.6  CL  --  99 98 98 102 100  CO2  --  22 25 26 27 24   GLUCOSE  --  109* 116* 128* 104* 91  BUN  --  45* 44* 42* 45* 39*  CREATININE  --  2.42* 2.47* 2.36* 2.33* 1.63*  CALCIUM  --  8.2* 8.3* 8.1* 8.8* 8.7*  MG 2.4  --   --   --   --   --    GFR: Estimated Creatinine Clearance: 40.8 mL/min (A) (by C-G formula based on SCr of 1.63 mg/dL (H)). Liver Function Tests: Recent Labs  Lab 03/24/23 0503  AST 25  ALT 20  ALKPHOS 63  BILITOT 0.6  PROT 5.2*  ALBUMIN 2.9*   No results for input(s): "LIPASE", "AMYLASE" in the last 168 hours. No results for  input(s): "AMMONIA" in the last 168 hours. Coagulation Profile: Recent Labs  Lab 03/24/23 1108 03/25/23 0351 03/26/23 0606 03/27/23 0502 03/29/23 0535  INR 2.2* 2.3* 1.9* 1.7* 1.1   Cardiac Enzymes: No results for input(s): "CKTOTAL", "CKMB", "CKMBINDEX", "TROPONINI" in the last 168 hours. BNP (last 3 results) Recent Labs    12/16/22 0959  PROBNP 3,692*   HbA1C: No results for input(s): "HGBA1C" in the last 72 hours. CBG: No results for input(s): "GLUCAP" in the last 168 hours. Lipid Profile: No results for input(s): "CHOL", "HDL", "LDLCALC", "TRIG", "CHOLHDL", "LDLDIRECT" in the last 72 hours. Thyroid Function Tests: No results for input(s): "TSH", "T4TOTAL", "FREET4", "T3FREE", "THYROIDAB" in the last 72 hours. Anemia Panel: No results for input(s): "VITAMINB12", "FOLATE", "FERRITIN", "TIBC", "IRON", "RETICCTPCT" in the last 72 hours.  Sepsis Labs: No results for input(s): "PROCALCITON", "LATICACIDVEN" in the last 168 hours.  Recent Results (from the past 240 hour(s))  Surgical pcr screen     Status: None   Collection Time: 03/25/23  4:41 AM   Specimen: Nasal Mucosa; Nasal Swab  Result Value Ref Range Status   MRSA, PCR NEGATIVE NEGATIVE Final   Staphylococcus aureus NEGATIVE NEGATIVE Final    Comment: (NOTE) The Xpert SA Assay (FDA approved for NASAL specimens in patients 9 years of age and older), is one component of a comprehensive surveillance program. It is not intended to diagnose infection nor to guide or monitor treatment. Performed at [facility], [address], [place], [place] [address]          Radiology Studies: No results found.      Scheduled Meds:  amiodarone  200 mg Oral Once per day on Monday Tuesday Thursday Friday Saturday   levothyroxine  88 mcg Oral Daily   melatonin  5 mg Oral QHS   pneumococcal 20-valent conjugate vaccine  0.5 mL Intramuscular Tomorrow-1000   polyethylene glycol  17 g Oral Daily   quiniDINE  gluconate  324 mg Oral BID   senna-docusate  1 tablet Oral BID   sodium chloride flush  3 mL Intravenous Q12H   Continuous Infusions:   ceFAZolin (ANCEF) IV     heparin 1,250 Units/hr (03/29/23 0857)      LOS: 6 days    Time spent: 51 minutes spent on chart review, discussion with nursing staff, consultants, updating family and interview/physical exam; more than 50% of that time was spent in counseling and/or coordination of care.    Alvira Philips [place], DO [facility] Available via Epic secure chat 7am-7pm After these hours,  please refer to coverage provider listed on [email or web address] 03/29/2023, 11:12 AM

## 2023-03-29 NOTE — Progress Notes (Signed)
  Progress Note    03/29/2023 7:48 AM 4 Days Post-Op  Subjective:  no complaints   Vitals:   03/29/23 0348 03/29/23 0400  BP: (!) 95/56   Pulse: 62   Resp: 20   Temp: 97.6 F (36.4 C)   SpO2: 96% 97%   Physical Exam: Lungs:  non labored Incisions:  L chest with vac with good seal Neurologic: A&O  CBC    Component Value Date/Time   WBC 9.3 03/29/2023 0535   RBC 2.52 (L) 03/29/2023 0535   HGB 7.6 (L) 03/29/2023 0535   HGB 16.6 12/04/2020 1445   HCT 24.2 (L) 03/29/2023 0535   HCT 50.1 12/04/2020 1445   PLT 140 (L) 03/29/2023 0535   PLT 158 12/04/2020 1445   MCV 96.0 03/29/2023 0535   MCV 94 12/04/2020 1445   MCH 30.2 03/29/2023 0535   MCHC 31.4 03/29/2023 0535   RDW 18.5 (H) 03/29/2023 0535   RDW 13.7 12/04/2020 1445   LYMPHSABS 0.4 (L) 03/23/2023 1018   MONOABS 1.0 03/23/2023 1018   EOSABS 0.1 03/23/2023 1018   BASOSABS 0.0 03/23/2023 1018    BMET    Component Value Date/Time   NA 133 (L) 03/29/2023 0535   NA 139 01/29/2021 0853   K 4.6 03/29/2023 0535   CL 100 03/29/2023 0535   CO2 24 03/29/2023 0535   GLUCOSE 91 03/29/2023 0535   BUN 39 (H) 03/29/2023 0535   BUN 18 01/29/2021 0853   CREATININE 1.63 (H) 03/29/2023 0535   CREATININE 1.22 (H) 09/12/2015 0846   CALCIUM 8.7 (L) 03/29/2023 0535   GFRNONAA 42 (L) 03/29/2023 0535   GFRAA 56 (L) 04/30/2020 1144    INR    Component Value Date/Time   INR 1.1 03/29/2023 0535     Intake/Output Summary (Last 24 hours) at 03/29/2023 0748 Last data filed at 03/29/2023 0429 Gross per 24 hour  Intake 1015.22 ml  Output 2205 ml  Net -1189.78 ml     Assessment/Plan:  82 y.o. male is s/p L sided barostim 4 Days Post-Op   Plan is for return to OR today for washout and drain placement.  All questions answered and he is willing to proceed.  Continue npo.  Consent ordered    [name], PA-C [facility] [phone] 03/29/2023 7:48 AM

## 2023-03-29 NOTE — Anesthesia Postprocedure Evaluation (Signed)
Anesthesia Post Note  Patient: Austin [name]  Procedure(s) Performed: IRRIGATION AND DEBRIDEMENT  CHEST WALL (Chest)     Patient location during evaluation: PACU Anesthesia Type: General Level of consciousness: awake and alert Pain management: pain level controlled Vital Signs Assessment: post-procedure vital signs reviewed and stable Respiratory status: spontaneous breathing, nonlabored ventilation, respiratory function stable and patient connected to nasal cannula oxygen Cardiovascular status: stable and blood pressure returned to baseline Postop Assessment: no apparent nausea or vomiting Anesthetic complications: no  No notable events documented.  Last Vitals:  Vitals:   03/29/23 2000 03/29/23 2054  BP:  94/62  Pulse:  61  Resp:  17  Temp:  36.5 C  SpO2: 98% 97%    Last Pain:  Vitals:   03/29/23 2201  TempSrc:   PainSc: 0-No pain                 Shelton Silvas

## 2023-03-30 ENCOUNTER — Encounter (HOSPITAL_COMMUNITY): Payer: Self-pay | Admitting: Surgery

## 2023-03-30 ENCOUNTER — Encounter (HOSPITAL_COMMUNITY): Payer: Medicare Other

## 2023-03-30 DIAGNOSIS — T888XXA Other specified complications of surgical and medical care, not elsewhere classified, initial encounter: Secondary | ICD-10-CM | POA: Diagnosis not present

## 2023-03-30 LAB — BASIC METABOLIC PANEL
Anion gap: 10 (ref 5–15)
BUN: 28 mg/dL — ABNORMAL HIGH (ref 8–23)
CO2: 25 mmol/L (ref 22–32)
Calcium: 8.7 mg/dL — ABNORMAL LOW (ref 8.9–10.3)
Chloride: 101 mmol/L (ref 98–111)
Creatinine, Ser: 1.35 mg/dL — ABNORMAL HIGH (ref 0.61–1.24)
GFR, Estimated: 53 mL/min — ABNORMAL LOW (ref >60)
Glucose, Bld: 83 mg/dL (ref 70–99)
Potassium: 4.3 mmol/L (ref 3.5–5.1)
Sodium: 136 mmol/L (ref 135–145)

## 2023-03-30 LAB — CBC
HCT: 24.2 % — ABNORMAL LOW (ref 39.0–52.0)
Hemoglobin: 7.5 g/dL — ABNORMAL LOW (ref 13.0–17.0)
MCH: 30 pg (ref 26.0–34.0)
MCHC: 31 g/dL (ref 30.0–36.0)
MCV: 96.8 fL (ref 80.0–100.0)
Platelets: 144 10*3/uL — ABNORMAL LOW (ref 150–400)
RBC: 2.5 MIL/uL — ABNORMAL LOW (ref 4.22–5.81)
RDW: 18.7 % — ABNORMAL HIGH (ref 11.5–15.5)
WBC: 8.2 10*3/uL (ref 4.0–10.5)
nRBC: 0.2 % (ref 0.0–0.2)

## 2023-03-30 LAB — HEPARIN LEVEL (UNFRACTIONATED): Heparin Unfractionated: 0.28 [IU]/mL — ABNORMAL LOW (ref 0.30–0.70)

## 2023-03-30 MED ORDER — SULFAMETHOXAZOLE-TRIMETHOPRIM 800-160 MG PO TABS: 1.0000 | ORAL_TABLET | Freq: Two times a day (BID) | ORAL | Status: DC

## 2023-03-30 MED ORDER — AMOXICILLIN-POT CLAVULANATE 875-125 MG PO TABS
1.0000 | ORAL_TABLET | Freq: Two times a day (BID) | ORAL | Status: DC
  Administered 2023-03-30 – 2023-04-01 (×5): 1 via ORAL
  Filled 2023-03-30 (×6): qty 1

## 2023-03-30 MED ORDER — DOXYCYCLINE HYCLATE 100 MG PO TABS
100.0000 mg | ORAL_TABLET | Freq: Two times a day (BID) | ORAL | Status: DC
  Administered 2023-03-30 – 2023-04-01 (×5): 100 mg via ORAL
  Filled 2023-03-30 (×5): qty 1

## 2023-03-30 NOTE — Progress Notes (Signed)
Mobility Specialist Progress Note:    03/30/23 1640  Mobility  Activity Ambulated with assistance in hallway  Level of Assistance Contact guard assist, steadying assist  Assistive Device Front wheel walker  Distance Ambulated (ft) 200 ft  Activity Response Tolerated well  Mobility Referral Yes  $Mobility charge 1 Mobility  Mobility Specialist Start Time (ACUTE ONLY) 1550  Mobility Specialist Stop Time (ACUTE ONLY) 1615  Mobility Specialist Time Calculation (min) (ACUTE ONLY) 25 min   Received pt supine in bed having no complaints and agreeable to mobility. Pt was asymptomatic throughout ambulation and returned to room w/o fault. Left seated EOB w/ call bell in reach and all needs met.   Thompson Grayer Mobility Specialist  Please contact vis Secure Chat or  Rehab Office [phone]

## 2023-03-30 NOTE — Progress Notes (Signed)
No ICM remote transmission received for 03/29/2023 due to currently hospitalized and rescheduled ICM transmission for 04/18/2023.

## 2023-03-30 NOTE — Progress Notes (Signed)
PROGRESS NOTE    Austin [name]  ZOX:[id number] DOB: 1940/12/14 DOA: 03/23/2023 PCP: [name], MD    Brief Narrative:   Austin [name] is a 82 y.o. 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  Progressive over the last 2 days with recent Barostim placement by vascular surgery on 03/17/2023 due to symptomatic advanced CHF.  Also endorses shortness of breath.  Denies fever, no chills, no abdominal pain, no nausea/vomiting/diarrhea.  In the ED, temperature 97.5 F, HR 61, RR 18, BP 82/62, SpO2 94% on room air.  WBC 9.6, hemoglobin 9.3, platelet count 151.  Sodium 135, potassium 4.4, chloride 97, CO2 28, glucose 131, BUN 48, creatinine 2.44.  BNP 352.5.  INR 2.0.  Chest x-ray with new stimulator implant left chest wall with prominent asymmetric soft tissue density surrounding the stimulator in the left chest wall concerning for hematoma, no acute cardiopulmonary disease process.  Vascular surgery was consulted.  Patient was given 500 mL IV fluid bolus, IV fentanyl/morphine, Zofran in the ED.  Warfarin was held and patient started on a heparin bridge given mechanical mitral valve.  TRH consulted for admission for further evaluation management of acute blood loss anemia secondary to hematoma surrounding recent surgical site  Assessment & Plan:   Acute blood loss anemia Left chest wall hematoma/seroma Patient presenting to ED with enlarging left chest region surrounding his surgical site associated with  chest discomfort and shortness of breath.  Recently underwent Barostim placement by vascular surgery, [name] on 03/17/2023 due to symptomatic events congestive heart failure.  Likely complicated by his need of chronic anticoagulation with Coumadin due to mechanical mitral valve.  Anemia panel with iron 60, TIBC 266, ferritin 144, folate 24.8, vitamin B12 537.  Vascular surgery was consulted and patient underwent I&D of left chest wall with placement of antibiotic beads, skin substitute, and wound VAC by [name] on 03/25/2023.  Repeat I&D on 03/29/2023 with 600 mL serous fluid removal with replacement of wound VAC. -- Vascular surgery following, appreciate assistance -- Hgb 9.3>8.7>7.8>8.8>7.9>7.2>8.5>7.7>7.6>7.7>8.2>7.6>7.5 -- s/p 1 unit pRBC 8/29 and 8/30, 2u FFP 8/30; 1 u pRBC 8/31 -- CBC in am -- Continue to monitor wound VAC output  Permanent atrial fibrillation/atypical flutter -- Continue amiodarone 200 mg daily on M/T/T/F/Sat -- Holding metoprolol given hypotension -- Started heparin drip 9/2 -- Monitor on telemetry  History of ventricular tachycardia -- Continue home quinidine, amiodarone  Hx complete heart block s/p PPM -- Continue monitor on telemetry  Chronic systolic congestive heart failure, compensated Follows with advanced heart failure clinic, last TTE with LVEF 20-25%, indeterminate diastolic dysfunction, mildly reduced RV function.  S/p AICD/PPM.  Recently underwent Bariostim implantation on 8/22 by vascular surgery for symptomatic advanced heart failure. -- Holding home metoprolol, valsartan, spironolactone, Lasix given hypotension -- Continue monitor BP closely -- Strict I's and O's and daily weights  Hx MV endocarditis s/p mechanical mitral valve replacement On Coumadin outpatient. -- Holding home Coumadin; plan to resume once cleared by vascular surgery -- Continue heparin drip  CKD stage IIIb -- Cr 2.44>2.42>2.47>2.36>2.33>1.63>1.35; stable (baseline 2.2 -  2.7) -- Avoid nephrotoxins, renal dose all medications -- BMP daily  Hypothyroidism -- Continue levothyroxine  Foot callus Follows with podiatry, [name] outpatient.  Pending outpatient MRI.  Outpatient follow-up with podiatry/vascular surgery.  OSA: --Continue nocturnal CPAP   DVT prophylaxis: Upper and drip, will need restart of Coumadin once cleared by vascular surgery    Code Status: Full Code Family Communication: No family present at bedside this morning  Disposition Plan:  Level of care: Progressive Status is: Inpatient Remains inpatient appropriate because: Needs close monitoring of left chest hematoma/seroma; will need reinitiation of Coumadin once cleared by vascular surgery    Consultants:  Vascular surgery  Procedures:   I&D of left chest wall with placement of antibiotic beads, skin substitute, and wound VAC by [name] on 03/25/2023 Repeat I&D left chest wall, placement of skin substitute and wound VAC, [name] on 9/3  Antimicrobials:  None   Subjective: Patient seen examined bedside, resting comfortably.  Lying in bed.  RN present.  Continues with wound VAC in place, 45 mL out past 24 hours. No other specific complaints or concerns at this time. Patient denies headache, no fever/chills/night sweats, no visual changes, no abdominal pain, no current shortness of breath, no focal weakness, no fatigue, no paresthesias.  No acute events overnight per nursing staff.    Objective: Vitals:   03/29/23 2054 03/30/23 0000 03/30/23 0356 03/30/23 0400  BP: 94/62  102/88   Pulse: 61  60   Resp: 17  17   Temp: 97.7 F (36.5 C)  98.4 F (36.9 C)   TempSrc: Oral  Oral   SpO2: 97% 97% 96% 97%  Weight:   94.1 kg   Height:        Intake/Output Summary (Last 24 hours) at 03/30/2023 1007 Last data filed at 03/29/2023 2000 Gross per 24 hour  Intake 300 ml  Output 430 ml  Net -130 ml   Filed Weights   03/29/23 0400 03/29/23 1332 03/30/23 0356  Weight:  94.4 kg 94.3 kg 94.1 kg    Examination:  Physical Exam: GEN: NAD, alert and oriented x 3, wd/wn HEENT: NCAT, PERRL, EOMI, sclera clear, MMM PULM: CTAB w/o wheezes/crackles, normal respiratory effort, on room air CV: RRR w/o M/G/R, left chest wall hematoma/seroma decreased in size with wound VAC in place  GI: abd soft, NTND, NABS, no R/G/M MSK: no peripheral edema, muscle strength globally intact 5/5 bilateral upper/lower extremities NEURO: CN II-XII intact, no focal deficits, sensation to light touch intact PSYCH: normal mood/affect Integumentary: Left chest wall wound VAC in place, right lateral foot callus as depicted below, otherwise no other concerning rashes/lesions/wounds noted on exposed skin surfaces.           Data Reviewed: I have personally reviewed following labs and imaging studies  CBC: Recent Labs  Lab 03/23/23 1018 03/23/23 1223 03/26/23 0606 03/26/23 1334 03/27/23 0502 03/28/23 0306 03/29/23 0535 03/30/23 0545  WBC 9.6   < > 15.7*  --  11.6* 11.8* 9.3 8.2  NEUTROABS 7.8*  --   --   --   --   --   --   --   HGB 9.3*   < > 7.6* 8.2* 7.7* 8.2* 7.6* 7.5*  HCT 28.4*   < > 23.8* 25.4* 23.6* 26.3* 24.2* 24.2*  MCV 94.7   < > 97.1  --  94.4 96.3 96.0 96.8  PLT 151   < > 131*  --  126* 161 140* 144*   < > =  values in this interval not displayed.   Basic Metabolic Panel: Recent Labs  Lab 03/23/23 2201 03/24/23 0503 03/25/23 0351 03/26/23 0606 03/27/23 0502 03/29/23 0535 03/30/23 0545  NA  --    < > 134* 133* 135 133* 136  K  --    < > 4.9 5.1 4.5 4.6 4.3  CL  --    < > 98 98 102 100 101  CO2  --    < > 25 26 27 24 25   GLUCOSE  --    < > 116* 128* 104* 91 83  BUN  --    < > 44* 42* 45* 39* 28*  CREATININE  --    < > 2.47* 2.36* 2.33* 1.63* 1.35*  CALCIUM  --    < > 8.3* 8.1* 8.8* 8.7* 8.7*  MG 2.4  --   --   --   --   --   --    < > = values in this interval not displayed.   GFR: Estimated Creatinine Clearance: 49.3 mL/min (A) (by C-G formula  based on SCr of 1.35 mg/dL (H)). Liver Function Tests: Recent Labs  Lab 03/24/23 0503  AST 25  ALT 20  ALKPHOS 63  BILITOT 0.6  PROT 5.2*  ALBUMIN 2.9*   No results for input(s): "LIPASE", "AMYLASE" in the last 168 hours. No results for input(s): "AMMONIA" in the last 168 hours. Coagulation Profile: Recent Labs  Lab 03/24/23 1108 03/25/23 0351 03/26/23 0606 03/27/23 0502 03/29/23 0535  INR 2.2* 2.3* 1.9* 1.7* 1.1   Cardiac Enzymes: No results for input(s): "CKTOTAL", "CKMB", "CKMBINDEX", "TROPONINI" in the last 168 hours. BNP (last 3 results) Recent Labs    12/16/22 0959  PROBNP 3,692*   HbA1C: No results for input(s): "HGBA1C" in the last 72 hours. CBG: No results for input(s): "GLUCAP" in the last 168 hours. Lipid Profile: No results for input(s): "CHOL", "HDL", "LDLCALC", "TRIG", "CHOLHDL", "LDLDIRECT" in the last 72 hours. Thyroid Function Tests: No results for input(s): "TSH", "T4TOTAL", "FREET4", "T3FREE", "THYROIDAB" in the last 72 hours. Anemia Panel: No results for input(s): "VITAMINB12", "FOLATE", "FERRITIN", "TIBC", "IRON", "RETICCTPCT" in the last 72 hours.  Sepsis Labs: No results for input(s): "PROCALCITON", "LATICACIDVEN" in the last 168 hours.  Recent Results (from the past 240 hour(s))  Surgical pcr screen     Status: None   Collection Time: 03/25/23  4:41 AM   Specimen: Nasal Mucosa; Nasal Swab  Result Value Ref Range Status   MRSA, PCR NEGATIVE NEGATIVE Final   Staphylococcus aureus NEGATIVE NEGATIVE Final    Comment: (NOTE) The Xpert SA Assay (FDA approved for NASAL specimens in patients 69 years of age and older), is one component of a comprehensive surveillance program. It is not intended to diagnose infection nor to guide or monitor treatment. Performed at [facility], [address], [place], [place] [address]          Radiology Studies: No results found.      Scheduled Meds:  amiodarone  200 mg Oral Once per  day on Monday Tuesday Thursday Friday Saturday   levothyroxine  88 mcg Oral Daily   melatonin  5 mg Oral QHS   pneumococcal 20-valent conjugate vaccine  0.5 mL Intramuscular Tomorrow-1000   polyethylene glycol  17 g Oral Daily   quiniDINE gluconate  324 mg Oral BID   senna-docusate  1 tablet Oral BID   sodium chloride flush  3 mL Intravenous Q12H   Continuous Infusions:  heparin 1,400 Units/hr (03/30/23 8416)      LOS: 7 days    Time spent: 49 minutes spent on chart review, discussion with nursing staff, consultants, updating family and interview/physical exam; more than 50% of that time was spent in counseling and/or coordination of care.    Alvira Philips [place], DO [facility] Available via Epic secure chat 7am-7pm After these hours, please refer to coverage provider listed on [email or web address] 03/30/2023, 10:07 AM

## 2023-03-30 NOTE — Progress Notes (Signed)
   03/30/23 0530  BiPAP/CPAP/SIPAP  BiPAP/CPAP/SIPAP Pt Type Adult  Reason BIPAP/CPAP not in use Non-compliant

## 2023-03-30 NOTE — Progress Notes (Signed)
Received iv consult on behalf of pt. Placed USIV 22g in L forearm. No other suitable veins in L forearm. L upper not assessed and pt has R upper arm restriction. Educated primary rn and pt on S&S of infiltration and when to re consult Iv team. Due to no other suitable veins assessed, pt may be PICC candidate if new current IV fails. Educated primary rn on hanging new tubing and placing expiration sticker on Iv tubing.

## 2023-03-30 NOTE — Progress Notes (Addendum)
  Progress Note    03/30/2023 8:08 AM 1 Day Post-Op  Subjective:  no complaints   Vitals:   03/30/23 0356 03/30/23 0400  BP: 102/88   Pulse: 60   Resp: 17   Temp: 98.4 F (36.9 C)   SpO2: 96% 97%   Physical Exam: Lungs:  non labored Incisions:  L chest vac with good seal; about 100cc serosanguinous fluid in cannister Extremities:  moving all ext well Neurologic: A&O  CBC    Component Value Date/Time   WBC 8.2 03/30/2023 0545   RBC 2.50 (L) 03/30/2023 0545   HGB 7.5 (L) 03/30/2023 0545   HGB 16.6 12/04/2020 1445   HCT 24.2 (L) 03/30/2023 0545   HCT 50.1 12/04/2020 1445   PLT 144 (L) 03/30/2023 0545   PLT 158 12/04/2020 1445   MCV 96.8 03/30/2023 0545   MCV 94 12/04/2020 1445   MCH 30.0 03/30/2023 0545   MCHC 31.0 03/30/2023 0545   RDW 18.7 (H) 03/30/2023 0545   RDW 13.7 12/04/2020 1445   LYMPHSABS 0.4 (L) 03/23/2023 1018   MONOABS 1.0 03/23/2023 1018   EOSABS 0.1 03/23/2023 1018   BASOSABS 0.0 03/23/2023 1018    BMET    Component Value Date/Time   NA 136 03/30/2023 0545   NA 139 01/29/2021 0853   K 4.3 03/30/2023 0545   CL 101 03/30/2023 0545   CO2 25 03/30/2023 0545   GLUCOSE 83 03/30/2023 0545   BUN 28 (H) 03/30/2023 0545   BUN 18 01/29/2021 0853   CREATININE 1.35 (H) 03/30/2023 0545   CREATININE 1.22 (H) 09/12/2015 0846   CALCIUM 8.7 (L) 03/30/2023 0545   GFRNONAA 53 (L) 03/30/2023 0545   GFRAA 56 (L) 04/30/2020 1144    INR    Component Value Date/Time   INR 1.1 03/29/2023 0535     Intake/Output Summary (Last 24 hours) at 03/30/2023 4696 Last data filed at 03/29/2023 2000 Gross per 24 hour  Intake 300 ml  Output 545 ml  Net -245 ml     Assessment/Plan:  82 y.o. male is s/p L chest washout and vac placement 1 Day Post-Op   L chest vac with good seal; no significant re-accumulation of fluid Will discuss timing of next vac change with [name] Continue heparin for now   [name], PA-C [facility] [phone] 03/30/2023 8:08 AM  I agree with the above.  I have seen and evaluated the patient.  His wound VAC is functioning properly.  There has been no reaccumulation of fluid.  I am contemplating leaving this wound VAC on for a week assuming it functions properly.  I would like for him to stay in the hospital another day or 2 to make sure that there are no issues.  Because of the sponge is now in the same tissue plane as the Barostim device, I am going to start him on Bactrim due to concerns of a possible contamination or infection.  Durene Cal

## 2023-03-30 NOTE — Progress Notes (Signed)
ANTICOAGULATION CONSULT NOTE- Follow Up  Pharmacy Consult for heparin  Indication:  afib with a mechanical MVR  No Known Allergies  Patient Measurements: Height: 6\' 1"  (185.4 cm) Weight: 94.1 kg (207 lb 7.3 oz) IBW/kg (Calculated) : 79.9 Heparin Dosing Weight: 89.9 kg  Vital Signs: Temp: 98.4 F (36.9 C) (09/04 0356) Temp Source: Oral (09/04 0356) BP: 102/88 (09/04 0356) Pulse Rate: 60 (09/04 0356)  Labs: Recent Labs    03/28/23 0306 03/28/23 2032 03/29/23 0535 03/30/23 0545  HGB 8.2*  --  7.6* 7.5*  HCT 26.3*  --  24.2* 24.2*  PLT 161  --  140* 144*  LABPROT  --   --  14.6  --   INR  --   --  1.1  --   HEPARINUNFRC  --  0.16* 0.26* 0.28*  CREATININE  --   --  1.63*  --     Estimated Creatinine Clearance: 40.8 mL/min (A) (by C-G formula based on SCr of 1.63 mg/dL (H)).   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   Conductive hearing loss    uses hearing aids   Hypothyroidism    Intraspinal abscess    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   Nonischemic cardiomyopathy (HCC)    Psychosexual dysfunction with inhibited sexual excitement    S/P mitral valve replacement    Syncope and collapse    Unspecified sleep apnea    last sleep study 11/07, uses cpap   Ventricular tachycardia (HCC)    VT (ventricular tachycardia) (HCC)     Medications:  Medications Prior to Admission  Medication Sig Dispense Refill Last Dose   acetaminophen (TYLENOL) 500 MG tablet Take 1,000 mg by mouth every 6 (six) hours as needed for mild pain.   03/22/2023   amiodarone (PACERONE) 200 MG tablet Take 1 tablet (200 mg total) by mouth daily. Do NOT take  Amiodarone on Wednesday or Sunday. 30 tablet 11 03/22/2023   amoxicillin (AMOXIL) 500 MG capsule TAKE 4 CAPSULES BY MOUTH 30 MINUTES PRIOR TO DENTAL WORK 4 capsule 2 unknown   furosemide (LASIX) 20 MG tablet Take 3 tablets (60 mg total) by mouth 2 (two) times daily. 180 tablet 8 03/22/2023   HYDROcodone-acetaminophen (NORCO) 5-325 MG tablet Take 1 tablet by mouth every 6 (six) hours as needed for moderate pain. 15 tablet 0 unknown   levothyroxine (SYNTHROID) 88 MCG tablet TAKE 1 TABLET BY MOUTH EVERY DAY 90 tablet 0 03/22/2023   melatonin 5 MG TABS Take 5 mg by mouth at bedtime.   03/22/2023   metoprolol succinate (TOPROL-XL) 25 MG 24 hr tablet Take 0.5 tablets (12.5 mg total) by mouth daily. 90 tablet 3 03/22/2023 at 2200   Multiple Vitamin (MULTIVITAMIN WITH MINERALS) TABS tablet Take 1 tablet by mouth in the morning.   03/22/2023   mupirocin ointment (BACTROBAN) 2 % Apply 1 Application topically daily. 30 g 2 Past Month   nitroGLYCERIN (NITROSTAT) 0.4 MG SL tablet Place 1 tablet (0.4 mg total) under the tongue every 5 (five) minutes x 3 doses as needed for chest pain. 25 tablet 2 unknown   quiniDINE gluconate 324 MG CR tablet TAKE 1 TABLET  BY MOUTH 2 TIMES DAILY. 180 tablet 3 03/22/2023   spironolactone (ALDACTONE) 25 MG tablet TAKE 1/2 TABLET BY MOUTH EVERY DAY 45 tablet 3 03/22/2023   valsartan (DIOVAN) 40 MG tablet Take 40 mg by mouth daily.   03/22/2023   warfarin (COUMADIN) 5 MG tablet TAKE AS DIRECTED BY THE COUMADIN CLINIC (Patient taking differently: Take 2.5-5 mg by mouth See admin instructions. Take 5 mg by mouth on Mon., Wed., and Friday in the evening, all the other days take 2.5 mg by mouth in the evening TAKE AS DIRECTED BY THE COUMADIN CLINIC) 80 tablet 1 03/22/2023 at 2200   zinc gluconate 50 MG tablet Take 1 tablet (50 mg total) by mouth daily. 90 tablet 1 03/22/2023   enoxaparin (LOVENOX) 80 MG/0.8ML injection Inject 0.8 mLs (80 mg total) into the skin every 12 (twelve) hours. (Patient not  taking: Reported on 03/23/2023) 16 mL 1 Not Taking   Scheduled:  Infusions:   Assessment: 82 yo male presenting with hematoma on warfarin PTA for afib with a history of mechanical MVR (goal INR 3-3.5).  Patient underwent I&D of L chest wall hematoma on 8/30 where heparin and warfarin were held 3 days post-op.  He did have an acute drop in Hgb from 8.2 > 7.7 on 9/1, thus delaying initiation of heparin.    Heparin level 0.26 this morning on 1150 units/hr.  Rate increased to 1250 units/hr and infusion was paused @1337  for I&D of chest wall 9/3.  Hgb 7.6, platelets 140.  Warfarin PTA dose: 5 mg MWF, 2.5 mg AOD  9/4 AM: heparin level returned at 0.28 on 1250 units/hr (subtherapeutic). Per RN, no issues with the heparin infusion running or signs/symptoms of bleeding . Hgb and plts low, stable.  Goal of Therapy:  Heparin level 0.3-0.7 Monitor platelets by anticoagulation protocol: Yes   Plan:  - Increase heparin to 1400 units/hr  - Obtain heparin level in AM (~8h after restart) and daily while on heparin - Obtain daily CBC while on heparin - Follow-up transition back to warfarin  Arabella Merles, PharmD. Clinical Pharmacist 03/30/2023 6:37 AM

## 2023-03-30 NOTE — Plan of Care (Signed)

## 2023-03-31 DIAGNOSIS — T888XXA Other specified complications of surgical and medical care, not elsewhere classified, initial encounter: Secondary | ICD-10-CM | POA: Diagnosis not present

## 2023-03-31 LAB — CBC
HCT: 25.1 % — ABNORMAL LOW (ref 39.0–52.0)
Hemoglobin: 7.8 g/dL — ABNORMAL LOW (ref 13.0–17.0)
MCH: 30.5 pg (ref 26.0–34.0)
MCHC: 31.1 g/dL (ref 30.0–36.0)
MCV: 98 fL (ref 80.0–100.0)
Platelets: 148 10*3/uL — ABNORMAL LOW (ref 150–400)
RBC: 2.56 MIL/uL — ABNORMAL LOW (ref 4.22–5.81)
RDW: 19.6 % — ABNORMAL HIGH (ref 11.5–15.5)
WBC: 8.6 10*3/uL (ref 4.0–10.5)
nRBC: 0 % (ref 0.0–0.2)

## 2023-03-31 LAB — BASIC METABOLIC PANEL
Anion gap: 7 (ref 5–15)
BUN: 25 mg/dL — ABNORMAL HIGH (ref 8–23)
CO2: 24 mmol/L (ref 22–32)
Calcium: 8.7 mg/dL — ABNORMAL LOW (ref 8.9–10.3)
Chloride: 104 mmol/L (ref 98–111)
Creatinine, Ser: 1.65 mg/dL — ABNORMAL HIGH (ref 0.61–1.24)
GFR, Estimated: 42 mL/min — ABNORMAL LOW (ref >60)
Glucose, Bld: 99 mg/dL (ref 70–99)
Potassium: 4.4 mmol/L (ref 3.5–5.1)
Sodium: 135 mmol/L (ref 135–145)

## 2023-03-31 LAB — HEPARIN LEVEL (UNFRACTIONATED): Heparin Unfractionated: 0.3 [IU]/mL (ref 0.30–0.70)

## 2023-03-31 MED ORDER — WARFARIN SODIUM 7.5 MG PO TABS
7.5000 mg | ORAL_TABLET | Freq: Once | ORAL | Status: AC
  Administered 2023-03-31: 7.5 mg via ORAL
  Filled 2023-03-31: qty 1

## 2023-03-31 MED ORDER — WARFARIN - PHARMACIST DOSING INPATIENT: Freq: Every day | Status: DC

## 2023-03-31 NOTE — Progress Notes (Signed)
PROGRESS NOTE    Austin [name]  ZOX:[id number] DOB: 1940-11-20 DOA: 03/23/2023 PCP: [name], MD     Brief Narrative:  Austin [name] is a 82 y.o. 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  Progressive over the last 2 days with recent Barostim placement by vascular surgery on 03/17/2023 due to symptomatic advanced CHF.  Also endorses shortness of breath.  Denies fever, no chills, no abdominal pain, no nausea/vomiting/diarrhea.   In the ED, temperature 97.5 F, HR 61, RR 18, BP 82/62, SpO2 94% on room air.  WBC 9.6, hemoglobin 9.3, platelet count 151.  Sodium 135, potassium 4.4, chloride 97, CO2 28, glucose 131, BUN 48, creatinine 2.44.  BNP 352.5.  INR 2.0.  Chest x-ray with new stimulator implant left chest wall with prominent asymmetric soft tissue density surrounding the stimulator in the left chest wall concerning for hematoma, no acute cardiopulmonary disease process.  Vascular surgery was consulted.  Patient was given 500 mL IV fluid bolus, IV fentanyl/morphine, Zofran in the ED.  Warfarin was held and patient started on a heparin bridge given mechanical mitral valve.  TRH consulted for admission for further evaluation management of acute blood loss anemia secondary to hematoma surrounding recent surgical site.  Patient underwent I&D of left chest wall x2 with wound VAC placement.  New events last 24 hours / Subjective: Doing well overall, wants to go home.  Assessment & Plan:    Principal Problem:   Fluid collection at surgical site, initial encounter Active Problems:   Atrial fibrillation (HCC)   Chronic systolic heart failure (HCC)   ICD (implantable cardioverter-defibrillator) in place   H/O mitral valve replacement with mechanical valve   S/P ablation of ventricular arrhythmia   VT (ventricular tachycardia) (HCC)   Hypothyroidism due to medication   Stage 3b chronic kidney disease (HCC)   Callous ulcer, limited to breakdown of skin (HCC)   OSA on CPAP   Acute blood loss anemia Left chest wall hematoma/seroma Patient presenting to ED with enlarging left chest region surrounding his surgical site associated with chest discomfort and shortness of breath.  Recently underwent Barostim placement by vascular surgery, [name] on 03/17/2023 due to symptomatic events congestive heart failure.  Likely complicated by his need of chronic anticoagulation with Coumadin due to mechanical mitral valve.  Anemia panel with iron 60, TIBC 266, ferritin 144, folate 24.8, vitamin B12 537.  Vascular surgery was consulted and patient underwent I&D of left chest wall with placement of antibiotic beads, skin substitute, and wound VAC by [name] on 03/25/2023.  Repeat I&D on 03/29/2023 with 600 mL serous fluid removal with replacement of wound VAC. -- S/p 1 unit pRBC 8/29 and 8/30, 2u FFP 8/30; 1 u pRBC 8/31 -- Vascular surgery ok to transition to Coumadin tonight, home wound VAC for discharge and return next week for outpatient wound VAC change --Augmentin, Doxy   Permanent atrial fibrillation/atypical flutter -- Amiodarone.  IV heparin--> Coumadin   History of ventricular tachycardia -- Quinidine, amiodarone   Hx complete heart block s/p PPM -- Telemetry   Chronic  systolic congestive heart failure, compensated Follows with advanced heart failure clinic, last TTE with LVEF 20-25%, indeterminate diastolic dysfunction, mildly reduced RV function.  S/p AICD/PPM.  Recently  underwent Bariostim implantation on 8/22 by vascular surgery for symptomatic advanced heart failure. -- Holding home metoprolol, valsartan, spironolactone, Lasix given hypotension -- Continue monitor BP closely -- Strict I's and O's and daily weights   Hx MV endocarditis s/p mechanical mitral valve replacement -- IV heparin--> Coumadin   CKD stage IIIb -- Baseline Cr 2.2 - 2.7.  Stable   Hypothyroidism -- Levothyroxine   Foot callus -- Follows with podiatry, [name] outpatient.  Pending outpatient MRI.  Outpatient follow-up with podiatry/vascular surgery.   OSA: -- Continue nocturnal CPAP     DVT prophylaxis: IV heparin  Code Status: Full code Family Communication: None Disposition Plan: ILF Status is: Inpatient Remains inpatient appropriate because: Remains on heparin drip    Antimicrobials:  Anti-infectives (From admission, onward)    Start     Dose/Rate Route Frequency Ordered Stop   03/30/23 1530  doxycycline (VIBRA-TABS) tablet 100 mg        100 mg Oral Every 12 hours 03/30/23 1431     03/30/23 1530  amoxicillin-clavulanate (AUGMENTIN) 875-125 MG per tablet 1 tablet        1 tablet Oral Every 12 hours 03/30/23 1432     03/30/23 1430  sulfamethoxazole-trimethoprim (BACTRIM DS) 800-160 MG per tablet 1 tablet  Status:  Discontinued        1 tablet Oral Every 12 hours 03/30/23 1335 03/30/23 1431   03/29/23 0600  ceFAZolin (ANCEF) IVPB 1 g/50 mL premix       Note to Pharmacy: Send with pt to OR   1 g 100 mL/hr over 30 Minutes Intravenous On call 03/28/23 0955 03/30/23 0600   03/25/23 1338  vancomycin (VANCOCIN) powder  Status:  Discontinued          As needed 03/25/23 1338 03/25/23 1408   03/25/23 1338  gentamicin (GARAMYCIN) injection  Status:  Discontinued          As needed 03/25/23 1339 03/25/23 1408        Objective: Vitals:   03/30/23 2012 03/31/23 0336 03/31/23 0500 03/31/23 0920  BP: (!) 111/55 99/65  105/67  Pulse: 60 67 60 (!) 59  Resp: 19 18   17   Temp: 98.7 F (37.1 C) 98.3 F (36.8 C)    TempSrc: Oral Oral    SpO2: 100% 97% 96% 98%  Weight:   92.7 kg   Height:        Intake/Output Summary (Last 24 hours) at 03/31/2023 1414 Last data filed at 03/31/2023 0650 Gross per 24 hour  Intake 363 ml  Output 300 ml  Net 63 ml   Filed Weights   03/29/23 1332 03/30/23 0356 03/31/23 0500  Weight: 94.3 kg 94.1 kg 92.7 kg    Examination:  General exam: Appears calm and comfortable  Respiratory system: Clear to auscultation. Respiratory effort normal. No respiratory distress. No conversational dyspnea.  Cardiovascular system: S1 & S2 heard, RRR. No murmurs. No pedal edema. Gastrointestinal system: Abdomen is nondistended, soft and nontender. Normal bowel sounds heard. Central nervous system: Alert and oriented. No focal neurological deficits. Speech clear.  Extremities: Right hand edema Skin: Wound VAC in place left chest wall Psychiatry: Judgement and insight appear normal. Mood & affect appropriate.   Data Reviewed: I have personally reviewed following labs and imaging studies  CBC: Recent Labs  Lab 03/27/23  0502 03/28/23 0306 03/29/23 0535 03/30/23 0545 03/31/23 0434  WBC 11.6* 11.8* 9.3 8.2 8.6  HGB 7.7* 8.2* 7.6* 7.5* 7.8*  HCT 23.6* 26.3* 24.2* 24.2* 25.1*  MCV 94.4 96.3 96.0 96.8 98.0  PLT 126* 161 140* 144* 148*   Basic Metabolic Panel: Recent Labs  Lab 03/26/23 0606 03/27/23 0502 03/29/23 0535 03/30/23 0545 03/31/23 0434  NA 133* 135 133* 136 135  K 5.1 4.5 4.6 4.3 4.4  CL 98 102 100 101 104  CO2 26 27 24 25 24   GLUCOSE 128* 104* 91 83 99  BUN 42* 45* 39* 28* 25*  CREATININE 2.36* 2.33* 1.63* 1.35* 1.65*  CALCIUM 8.1* 8.8* 8.7* 8.7* 8.7*   GFR: Estimated Creatinine Clearance: 40.4 mL/min (A) (by C-G formula based on SCr of 1.65 mg/dL (H)). Liver Function Tests: No results for input(s): "AST", "ALT", "ALKPHOS", "BILITOT", "PROT", "ALBUMIN" in the last 168 hours. No results for input(s): "LIPASE",  "AMYLASE" in the last 168 hours. No results for input(s): "AMMONIA" in the last 168 hours. Coagulation Profile: Recent Labs  Lab 03/25/23 0351 03/26/23 0606 03/27/23 0502 03/29/23 0535  INR 2.3* 1.9* 1.7* 1.1   Cardiac Enzymes: No results for input(s): "CKTOTAL", "CKMB", "CKMBINDEX", "TROPONINI" in the last 168 hours. BNP (last 3 results) Recent Labs    12/16/22 0959  PROBNP 3,692*   HbA1C: No results for input(s): "HGBA1C" in the last 72 hours. CBG: No results for input(s): "GLUCAP" in the last 168 hours. Lipid Profile: No results for input(s): "CHOL", "HDL", "LDLCALC", "TRIG", "CHOLHDL", "LDLDIRECT" in the last 72 hours. Thyroid Function Tests: No results for input(s): "TSH", "T4TOTAL", "FREET4", "T3FREE", "THYROIDAB" in the last 72 hours. Anemia Panel: No results for input(s): "VITAMINB12", "FOLATE", "FERRITIN", "TIBC", "IRON", "RETICCTPCT" in the last 72 hours. Sepsis Labs: No results for input(s): "PROCALCITON", "LATICACIDVEN" in the last 168 hours.  Recent Results (from the past 240 hour(s))  Surgical pcr screen     Status: None   Collection Time: 03/25/23  4:41 AM   Specimen: Nasal Mucosa; Nasal Swab  Result Value Ref Range Status   MRSA, PCR NEGATIVE NEGATIVE Final   Staphylococcus aureus NEGATIVE NEGATIVE Final    Comment: (NOTE) The Xpert SA Assay (FDA approved for NASAL specimens in patients 61 years of age and older), is one component of a comprehensive surveillance program. It is not intended to diagnose infection nor to guide or monitor treatment. Performed at [facility], [address] [place], [place] [address]       Radiology Studies: No results found.    Scheduled Meds:  amiodarone  200 mg Oral Once per day on Monday Tuesday Thursday Friday Saturday   amoxicillin-clavulanate  1 tablet Oral Q12H   doxycycline  100 mg Oral Q12H   levothyroxine  88 mcg Oral Daily   melatonin  5 mg Oral QHS   pneumococcal 20-valent conjugate vaccine   0.5 mL Intramuscular Tomorrow-1000   polyethylene glycol  17 g Oral Daily   quiniDINE gluconate  324 mg Oral BID   senna-docusate  1 tablet Oral BID   sodium chloride flush  3 mL Intravenous Q12H   Continuous Infusions:  heparin 1,450 Units/hr (03/31/23 1024)     LOS: 8 days   Time spent: 35 minutes   Noralee Stain, DO [facility] 03/31/2023, 2:14 PM   Available via Epic secure chat 7am-7pm After these hours, please refer to coverage provider listed on [email or web address]

## 2023-03-31 NOTE — TOC Initial Note (Signed)
Transition of Care [facility]) - Initial/Assessment Note    Patient Details  Name: Austin [name] MRN: [id number] Date of Birth: Mar 12, 1941  [facility] CM/SW Contact:    Gala Lewandowsky, RN Phone Number: 03/31/2023, 3:35 PM  Clinical Narrative: Case Manager received a referral for home wound vac therapy. Patient currently lives at [facility] with spouse. 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 Case Manager discussed the wound vac and home health services with the patient. Patient states he is aware that he will transition home with the wound vac. Case Manager did submit clinicals to [employer] formerly [facility], therapeutic. Case Manager needs additional measurements- PA is aware. Patient did provide Case Manager verbal permission to contact daughter regarding home health services and agency choice. Patient states he wants to use [facility]; however, wants his daughters opinion as well. Case Manager called daughter- awaiting call back.. Case Manager will continue to follow for additional transition of care needs as the patient progresses.                   Expected Discharge Plan: Home w Home Health Services Barriers to Discharge: Continued Medical Work up  Patient Goals and CMS Choice Patient states their goals for this hospitalization and ongoing recovery are:: patient is from [facility].   Choice offered to / list presented to : Patient    Expected Discharge Plan and Services In-house Referral: NA Discharge Planning Services: CM Consult Post Acute Care Choice: Home Health Living arrangements for the past 2 months: Independent Living Facility                 DME Arranged: Negative pressure wound device DME Agency: [employer] Date DME Agency Contacted: 03/31/23 Time DME Agency Contacted: 1300 Representative spoke with at DME Agency: [facility] HH Arranged: RN, Disease Management   Prior Living Arrangements/Services Living  arrangements for the past 2 months: Independent Living Facility Lives with:: Facility Resident Patient language and need for interpreter reviewed:: Yes Do you feel safe going back to the place where you live?: Yes      Need for Family Participation in Patient Care: Yes (Comment) Care giver support system in place?: Yes (comment)   Criminal Activity/Legal Involvement Pertinent to Current Situation/Hospitalization: No - Comment as needed  Activities of Daily Living Home Assistive Devices/Equipment: None ADL Screening (condition at time of admission) Patient's cognitive ability adequate to safely complete daily activities?: Yes Is the patient deaf or have difficulty hearing?: No Does the patient have difficulty seeing, even when wearing glasses/contacts?: No Does the patient have difficulty concentrating, remembering, or making decisions?: No Patient able to express need for assistance with ADLs?: Yes Does the patient have difficulty dressing or bathing?: No Independently performs ADLs?: Yes (appropriate for developmental age) Does the patient have difficulty walking or climbing stairs?: No Weakness of Legs: None Weakness of Arms/Hands: Left  Permission Sought/Granted Permission sought to share information with : Family Supports, Magazine features editor, Case Estate manager/land agent granted to share information with : Yes, Verbal Permission Granted   Emotional Assessment Appearance:: Appears stated age Attitude/Demeanor/Rapport: Engaged Affect (typically observed): Appropriate Orientation: : Oriented to Self, Oriented to Place, Oriented to  Time, Oriented to Situation Alcohol / Substance Use: Not Applicable Psych Involvement: No (comment)  Admission diagnosis:  Hematoma [T14.8XXA] Postop check [Z09] Fluid collection at surgical site, initial encounter [T88.8XXA] Chest swelling [R22.2] Anemia, unspecified type [D64.9] Patient Active Problem List   Diagnosis Date Noted  Fluid collection at surgical site, initial encounter 03/23/2023   Callous ulcer, limited to breakdown of skin (HCC) 09/27/2022   OSA on CPAP 09/27/2022   Dyspnea 07/05/2022   Anemia due to zinc deficiency 05/25/2022   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   Stage 3b chronic kidney disease (HCC) 05/17/2022   Need for immunization against influenza 05/17/2022   Chronic anticoagulation 10/14/2018   VT (ventricular tachycardia) (HCC) 10/12/2018   Neuropathy 08/25/2018   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   Nonischemic cardiomyopathy (HCC)    Subtherapeutic international normalized ratio (INR) 01/10/2016   S/P ablation of ventricular arrhythmia 01/10/2016   H/O mitral valve replacement with mechanical valve    Encounter for therapeutic drug monitoring 08/20/2013   Cardiac device, implant, or graft infection or inflammation (HCC) 06/28/2011   ICD (implantable cardioverter-defibrillator) in place 06/02/2009   Chronic systolic heart failure (HCC) 04/15/2009   ERECTILE DYSFUNCTION 11/27/2008   HEARING LOSS 11/27/2008   Atrial fibrillation (HCC) 11/27/2008   SLEEP APNEA 11/27/2008   PCP:  [name], MD Pharmacy:   [facility] [phone] - [place], [place] - [address] AT [address] OF [facility] [address] [place] [address] Phone: [phone] Fax: [phone]  [facility] - [place], [place] - [address] [place] [address] Phone: [phone] Fax: [phone]  [facility] [address] [place], [place] - [address] [place] [address] Phone: [phone] Fax: [phone]  Social Determinants of Health (SDOH) Social History: SDOH Screenings   Food Insecurity: No Food Insecurity (03/23/2023)  Housing: Low Risk  (03/23/2023)  Transportation Needs: No Transportation Needs (03/23/2023)  Utilities: Not At Risk (03/23/2023)  Alcohol  Screen: Low Risk  (03/08/2023)  Depression (PHQ2-9): Low Risk  (03/08/2023)  Financial Resource Strain: Low Risk  (03/08/2023)  Physical Activity: Insufficiently Active (03/08/2023)  Social Connections: Moderately Integrated (03/08/2023)  Stress: No Stress Concern Present (03/08/2023)  Tobacco Use: Low Risk  (03/29/2023)  Health Literacy: Adequate Health Literacy (03/08/2023)   Readmission Risk Interventions     No data to display

## 2023-03-31 NOTE — Progress Notes (Signed)
Refused CPAP

## 2023-03-31 NOTE — Progress Notes (Addendum)
  Progress Note    03/31/2023 7:41 AM 2 Days Post-Op  Subjective:  no complaints.  Wants to go home   Vitals:   03/31/23 0336 03/31/23 0500  BP: 99/65   Pulse: 67 60  Resp: 18   Temp: 98.3 F (36.8 C)   SpO2: 97% 96%   Physical Exam: Lungs:  non labored Incisions:  L chest with wound vac, good seal; no reaccumulation of fluid Neurologic: a&O  CBC    Component Value Date/Time   WBC 8.6 03/31/2023 0434   RBC 2.56 (L) 03/31/2023 0434   HGB 7.8 (L) 03/31/2023 0434   HGB 16.6 12/04/2020 1445   HCT 25.1 (L) 03/31/2023 0434   HCT 50.1 12/04/2020 1445   PLT 148 (L) 03/31/2023 0434   PLT 158 12/04/2020 1445   MCV 98.0 03/31/2023 0434   MCV 94 12/04/2020 1445   MCH 30.5 03/31/2023 0434   MCHC 31.1 03/31/2023 0434   RDW 19.6 (H) 03/31/2023 0434   RDW 13.7 12/04/2020 1445   LYMPHSABS 0.4 (L) 03/23/2023 1018   MONOABS 1.0 03/23/2023 1018   EOSABS 0.1 03/23/2023 1018   BASOSABS 0.0 03/23/2023 1018    BMET    Component Value Date/Time   NA 135 03/31/2023 0434   NA 139 01/29/2021 0853   K 4.4 03/31/2023 0434   CL 104 03/31/2023 0434   CO2 24 03/31/2023 0434   GLUCOSE 99 03/31/2023 0434   BUN 25 (H) 03/31/2023 0434   BUN 18 01/29/2021 0853   CREATININE 1.65 (H) 03/31/2023 0434   CREATININE 1.22 (H) 09/12/2015 0846   CALCIUM 8.7 (L) 03/31/2023 0434   GFRNONAA 42 (L) 03/31/2023 0434   GFRAA 56 (L) 04/30/2020 1144    INR    Component Value Date/Time   INR 1.1 03/29/2023 0535     Intake/Output Summary (Last 24 hours) at 03/31/2023 0741 Last data filed at 03/31/2023 0650 Gross per 24 hour  Intake 363 ml  Output 300 ml  Net 63 ml     Assessment/Plan:  82 y.o. male is s/p L chest washout and vac placement 2 Days Post-Op   L chest without re-accumulation of fluid.  Vac in place with good seal.  TOC consulted for [facility] RN and home vac.  Ok to resume coumadin from our standpoint tonight.  Ok for discharge home tomorrow if home vac received and if exam remains stable.   He will be brought back as an outpatient for wound vac change on Wednesday 04/06/23 in the OR.  He will require [facility] RN for vac changes thereafter.   [name], PA-C [facility] [phone] 03/31/2023 7:41 AM  I agree with the above.  I have seen and evaluated the patient.  It does not appear that he has had any additional fluid accumulation.  Therefore, from my perspective if this trend continues, he potentially could go home tomorrow.  He can restart his Coumadin.  He can be transition back to Lovenox.  I have him scheduled as an outpatient in the operating room on Wednesday for a wound VAC change.  Durene Cal

## 2023-03-31 NOTE — Progress Notes (Signed)
Mobility Specialist Progress Note:    03/31/23 1441  Mobility  Activity Ambulated with assistance in hallway  Level of Assistance Contact guard assist, steadying assist  Assistive Device Front wheel walker  Distance Ambulated (ft) 200 ft  Activity Response Tolerated well  Mobility Referral Yes  $Mobility charge 1 Mobility  Mobility Specialist Start Time (ACUTE ONLY) 1415  Mobility Specialist Stop Time (ACUTE ONLY) 1431  Mobility Specialist Time Calculation (min) (ACUTE ONLY) 16 min   Received pt in chair having no complaints and agreeable to mobility. Pt was asymptomatic throughout ambulation and returned to room w/o fault. Left in bed w/ call bell in reach and all needs met.   Thompson Grayer Mobility Specialist  Please contact vis Secure Chat or  Rehab Office [phone]

## 2023-03-31 NOTE — Progress Notes (Addendum)
ANTICOAGULATION CONSULT NOTE- Follow Up  Pharmacy Consult for heparin  Indication:  afib with a mechanical MVR  No Known Allergies  Patient Measurements: Height: 6\' 1"  (185.4 cm) Weight: 92.7 kg (204 lb 4.8 oz) IBW/kg (Calculated) : 79.9 Heparin Dosing Weight: 89.9 kg  Vital Signs: Temp: 98.3 F (36.8 C) (09/05 0336) Temp Source: Oral (09/05 0336) BP: 99/65 (09/05 0336) Pulse Rate: 60 (09/05 0500)  Labs: Recent Labs    03/29/23 0535 03/30/23 0545 03/31/23 0434  HGB 7.6* 7.5* 7.8*  HCT 24.2* 24.2* 25.1*  PLT 140* 144* 148*  LABPROT 14.6  --   --   INR 1.1  --   --   HEPARINUNFRC 0.26* 0.28* 0.30  CREATININE 1.63* 1.35* 1.65*    Estimated Creatinine Clearance: 40.4 mL/min (A) (by C-G formula based on SCr of 1.65 mg/dL (H)).   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   Conductive hearing loss    uses hearing aids   Hypothyroidism    Intraspinal abscess    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   Nonischemic cardiomyopathy (HCC)    Psychosexual dysfunction with inhibited sexual excitement    S/P mitral valve replacement    Syncope and collapse    Unspecified sleep apnea    last sleep study 11/07, uses cpap   Ventricular tachycardia (HCC)    VT (ventricular tachycardia) (HCC)     Medications:  Medications Prior to Admission  Medication Sig Dispense Refill Last Dose   acetaminophen (TYLENOL) 500 MG tablet Take 1,000 mg by mouth every 6 (six) hours as needed for mild pain.   03/22/2023   amiodarone (PACERONE) 200 MG tablet Take 1 tablet (200 mg total) by mouth daily. Do NOT take Amiodarone on Wednesday or Sunday. 30 tablet 11  03/22/2023   amoxicillin (AMOXIL) 500 MG capsule TAKE 4 CAPSULES BY MOUTH 30 MINUTES PRIOR TO DENTAL WORK 4 capsule 2 unknown   furosemide (LASIX) 20 MG tablet Take 3 tablets (60 mg total) by mouth 2 (two) times daily. 180 tablet 8 03/22/2023   HYDROcodone-acetaminophen (NORCO) 5-325 MG tablet Take 1 tablet by mouth every 6 (six) hours as needed for moderate pain. 15 tablet 0 unknown   levothyroxine (SYNTHROID) 88 MCG tablet TAKE 1 TABLET BY MOUTH EVERY DAY 90 tablet 0 03/22/2023   melatonin 5 MG TABS Take 5 mg by mouth at bedtime.   03/22/2023   metoprolol succinate (TOPROL-XL) 25 MG 24 hr tablet Take 0.5 tablets (12.5 mg total) by mouth daily. 90 tablet 3 03/22/2023 at 2200   Multiple Vitamin (MULTIVITAMIN WITH MINERALS) TABS tablet Take 1 tablet by mouth in the morning.   03/22/2023   mupirocin ointment (BACTROBAN) 2 % Apply 1 Application topically daily. 30 g 2 Past Month   nitroGLYCERIN (NITROSTAT) 0.4 MG SL tablet Place 1 tablet (0.4 mg total) under the tongue every 5 (five) minutes x 3 doses as needed for chest pain. 25 tablet 2 unknown   quiniDINE gluconate 324 MG CR tablet TAKE 1 TABLET BY MOUTH 2 TIMES DAILY. 180 tablet 3 03/22/2023   spironolactone (ALDACTONE) 25 MG tablet TAKE 1/2 TABLET BY MOUTH EVERY DAY 45 tablet 3 03/22/2023  valsartan (DIOVAN) 40 MG tablet Take 40 mg by mouth daily.   03/22/2023   warfarin (COUMADIN) 5 MG tablet TAKE AS DIRECTED BY THE COUMADIN CLINIC (Patient taking differently: Take 2.5-5 mg by mouth See admin instructions. Take 5 mg by mouth on Mon., Wed., and Friday in the evening, all the other days take 2.5 mg by mouth in the evening TAKE AS DIRECTED BY THE COUMADIN CLINIC) 80 tablet 1 03/22/2023 at 2200   zinc gluconate 50 MG tablet Take 1 tablet (50 mg total) by mouth daily. 90 tablet 1 03/22/2023   enoxaparin (LOVENOX) 80 MG/0.8ML injection Inject 0.8 mLs (80 mg total) into the skin every 12 (twelve) hours. (Patient not taking: Reported on 03/23/2023) 16 mL 1 Not  Taking   Scheduled:  Infusions:   Assessment: 82 yo male presenting with hematoma on warfarin PTA for afib with a history of mechanical MVR (goal INR 3-3.5).  Patient underwent I&D of L chest wall hematoma on 8/30 where heparin and warfarin were held 3 days post-op, heparin now resumed.  Heparin level is therapeutic at 0.3 but on lower end of range, warfarin remains on hold, CBC stable.  Warfarin PTA dose: 5 mg MWF, 2.5 mg AOD  Goal of Therapy:  Heparin level 0.3-0.7 Monitor platelets by anticoagulation protocol: Yes   Plan:  -Increase heparin to 1450 units/hr -Daily heparin level and CBC  ADDENDUM: ok to start warfarin. Will give 7.5mg  x1 tonight  Fredonia Highland, PharmD, BCPS, [facility] Clinical Pharmacist [phone] Please check AMION for all [facility] Pharm[facility] numbers 03/31/2023

## 2023-04-01 ENCOUNTER — Other Ambulatory Visit (HOSPITAL_COMMUNITY): Payer: Self-pay

## 2023-04-01 ENCOUNTER — Other Ambulatory Visit: Payer: Self-pay

## 2023-04-01 DIAGNOSIS — S21102D Unspecified open wound of left front wall of thorax without penetration into thoracic cavity, subsequent encounter: Secondary | ICD-10-CM

## 2023-04-01 DIAGNOSIS — T888XXA Other specified complications of surgical and medical care, not elsewhere classified, initial encounter: Secondary | ICD-10-CM | POA: Diagnosis not present

## 2023-04-01 LAB — CBC
HCT: 26.8 % — ABNORMAL LOW (ref 39.0–52.0)
Hemoglobin: 8 g/dL — ABNORMAL LOW (ref 13.0–17.0)
MCH: 30.2 pg (ref 26.0–34.0)
MCHC: 29.9 g/dL — ABNORMAL LOW (ref 30.0–36.0)
MCV: 101.1 fL — ABNORMAL HIGH (ref 80.0–100.0)
Platelets: 159 10*3/uL (ref 150–400)
RBC: 2.65 MIL/uL — ABNORMAL LOW (ref 4.22–5.81)
RDW: 19.8 % — ABNORMAL HIGH (ref 11.5–15.5)
WBC: 9.5 10*3/uL (ref 4.0–10.5)
nRBC: 0 % (ref 0.0–0.2)

## 2023-04-01 LAB — PROTIME-INR
INR: 1.1 (ref 0.8–1.2)
Prothrombin Time: 14.4 s (ref 11.4–15.2)

## 2023-04-01 LAB — BASIC METABOLIC PANEL
Anion gap: 9 (ref 5–15)
BUN: 23 mg/dL (ref 8–23)
CO2: 24 mmol/L (ref 22–32)
Calcium: 8.9 mg/dL (ref 8.9–10.3)
Chloride: 101 mmol/L (ref 98–111)
Creatinine, Ser: 1.57 mg/dL — ABNORMAL HIGH (ref 0.61–1.24)
GFR, Estimated: 44 mL/min — ABNORMAL LOW (ref >60)
Glucose, Bld: 92 mg/dL (ref 70–99)
Potassium: 4.4 mmol/L (ref 3.5–5.1)
Sodium: 134 mmol/L — ABNORMAL LOW (ref 135–145)

## 2023-04-01 LAB — HEPARIN LEVEL (UNFRACTIONATED): Heparin Unfractionated: 0.33 [IU]/mL (ref 0.30–0.70)

## 2023-04-01 MED ORDER — DOXYCYCLINE HYCLATE 100 MG PO TABS: 100.0000 mg | ORAL_TABLET | Freq: Two times a day (BID) | ORAL | 0 refills | Status: DC

## 2023-04-01 MED ORDER — ENOXAPARIN SODIUM 100 MG/ML IJ SOSY
100.0000 mg | PREFILLED_SYRINGE | Freq: Two times a day (BID) | INTRAMUSCULAR | Status: DC
  Administered 2023-04-01: 100 mg via SUBCUTANEOUS
  Filled 2023-04-01: qty 1

## 2023-04-01 MED ORDER — ENOXAPARIN SODIUM 100 MG/ML IJ SOSY: 100.0000 mg | PREFILLED_SYRINGE | Freq: Two times a day (BID) | INTRAMUSCULAR | 0 refills | Status: DC

## 2023-04-01 MED ORDER — AMOXICILLIN-POT CLAVULANATE 875-125 MG PO TABS: 1.0000 | ORAL_TABLET | Freq: Two times a day (BID) | ORAL | 0 refills | Status: DC

## 2023-04-01 MED ORDER — WARFARIN SODIUM 7.5 MG PO TABS
7.5000 mg | ORAL_TABLET | Freq: Once | ORAL | Status: AC
  Administered 2023-04-01: 7.5 mg via ORAL
  Filled 2023-04-01: qty 1

## 2023-04-01 NOTE — Progress Notes (Addendum)
ANTICOAGULATION CONSULT NOTE- Follow Up  Pharmacy Consult for heparin  Indication:  afib with a mechanical MVR  No Known Allergies  Patient Measurements: Height: 6\' 1"  (185.4 cm) Weight: 93.3 kg (205 lb 9.6 oz) IBW/kg (Calculated) : 79.9 Heparin Dosing Weight: 89.9 kg  Vital Signs: Temp: 98.5 F (36.9 C) (09/06 0606) Temp Source: Oral (09/06 0606) BP: 109/66 (09/06 0606) Pulse Rate: 69 (09/06 0606)  Labs: Recent Labs    03/30/23 0545 03/31/23 0434 04/01/23 0705  HGB 7.5* 7.8* 8.0*  HCT 24.2* 25.1* 26.8*  PLT 144* 148* 159  LABPROT  --   --  14.4  INR  --   --  1.1  HEPARINUNFRC 0.28* 0.30 0.33  CREATININE 1.35* 1.65*  --     Estimated Creatinine Clearance: 40.4 mL/min (A) (by C-G formula based on SCr of 1.65 mg/dL (H)).   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   Conductive hearing loss    uses hearing aids   Hypothyroidism    Intraspinal abscess    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   Nonischemic cardiomyopathy (HCC)    Psychosexual dysfunction with inhibited sexual excitement    S/P mitral valve replacement    Syncope and collapse    Unspecified sleep apnea    last sleep study 11/07, uses cpap   Ventricular tachycardia (HCC)    VT (ventricular tachycardia) (HCC)     Medications:  Medications Prior to Admission  Medication Sig Dispense Refill Last Dose   acetaminophen (TYLENOL) 500 MG tablet Take 1,000 mg by mouth every 6 (six) hours as needed for mild pain.   03/22/2023   amiodarone (PACERONE) 200 MG tablet Take 1 tablet (200 mg total) by mouth daily. Do NOT take Amiodarone on Wednesday or Sunday. 30 tablet 11  03/22/2023   amoxicillin (AMOXIL) 500 MG capsule TAKE 4 CAPSULES BY MOUTH 30 MINUTES PRIOR TO DENTAL WORK 4 capsule 2 unknown   furosemide (LASIX) 20 MG tablet Take 3 tablets (60 mg total) by mouth 2 (two) times daily. 180 tablet 8 03/22/2023   HYDROcodone-acetaminophen (NORCO) 5-325 MG tablet Take 1 tablet by mouth every 6 (six) hours as needed for moderate pain. 15 tablet 0 unknown   levothyroxine (SYNTHROID) 88 MCG tablet TAKE 1 TABLET BY MOUTH EVERY DAY 90 tablet 0 03/22/2023   melatonin 5 MG TABS Take 5 mg by mouth at bedtime.   03/22/2023   metoprolol succinate (TOPROL-XL) 25 MG 24 hr tablet Take 0.5 tablets (12.5 mg total) by mouth daily. 90 tablet 3 03/22/2023 at 2200   Multiple Vitamin (MULTIVITAMIN WITH MINERALS) TABS tablet Take 1 tablet by mouth in the morning.   03/22/2023   mupirocin ointment (BACTROBAN) 2 % Apply 1 Application topically daily. 30 g 2 Past Month   nitroGLYCERIN (NITROSTAT) 0.4 MG SL tablet Place 1 tablet (0.4 mg total) under the tongue every 5 (five) minutes x 3 doses as needed for chest pain. 25 tablet 2 unknown   quiniDINE gluconate 324 MG CR tablet TAKE 1 TABLET BY MOUTH 2 TIMES DAILY. 180 tablet 3 03/22/2023   spironolactone (ALDACTONE) 25 MG tablet TAKE 1/2 TABLET BY MOUTH EVERY DAY 45 tablet  3 03/22/2023   valsartan (DIOVAN) 40 MG tablet Take 40 mg by mouth daily.   03/22/2023   warfarin (COUMADIN) 5 MG tablet TAKE AS DIRECTED BY THE COUMADIN CLINIC (Patient taking differently: Take 2.5-5 mg by mouth See admin instructions. Take 5 mg by mouth on Mon., Wed., and Friday in the evening, all the other days take 2.5 mg by mouth in the evening TAKE AS DIRECTED BY THE COUMADIN CLINIC) 80 tablet 1 03/22/2023 at 2200   zinc gluconate 50 MG tablet Take 1 tablet (50 mg total) by mouth daily. 90 tablet 1 03/22/2023   enoxaparin (LOVENOX) 80 MG/0.8ML injection Inject 0.8 mLs (80 mg total) into the skin every 12 (twelve) hours. (Patient not taking: Reported on 03/23/2023) 16 mL 1 Not  Taking   Scheduled:  Infusions:   Assessment: 82 yo male presenting with hematoma on warfarin PTA for afib with a history of mechanical MVR (goal INR 3-3.5).  Patient underwent I&D of L chest wall hematoma on 8/30 where heparin and warfarin were held 3 days post-op, heparin now resumed.  INR 1.1, heparin level therapeutic, CBC stable. Transitioning from IV heparin to enoxaparin for D/C today.  Warfarin PTA dose: 5 mg MWF, 2.5 mg AOD  Goal of Therapy:  Heparin level 0.3-0.7 INR 3-3.5 Monitor platelets by anticoagulation protocol: Yes    Plan:  -Stop heparin -Enoxaparin 100mg  SQ BID -Warfarin 7.5mg  x1 then 5mg  daily until INR checked    Fredonia Highland, PharmD, BCPS, [facility] Clinical Pharmacist [phone] Please check AMION for all [facility] numbers 04/01/2023

## 2023-04-01 NOTE — Progress Notes (Signed)
Remote ICD transmission.   

## 2023-04-01 NOTE — TOC Progression Note (Addendum)
Transition of Care [facility]) - Progression Note    Patient Details  Name: Austin [name] MRN: [id number] Date of Birth: 19-Oct-1940  Transition of Care [facility]) CM/SW Contact  Graves-Bigelow, Lamar Laundry, RN Phone Number: 04/01/2023, 12:04 PM  Clinical Narrative: Case Manager was able to speak with daughter this morning and she confirmed [facility] for home health services. Referral submitted to [facility] and start of care per MD is after next Wed after the outpatient vac change. Case Manager is awaiting insurance approval for the wound vac. Case Manager will continue to follow for additional needs as the patient progresses.     1514 04-01-23 Wound VAC has been approved and delivered to the room. Staff RN to switch the [facility] out prior to patient leaving.     Expected Discharge Plan: Home w Home Health Services Barriers to Discharge: Insurance Authorization  Expected Discharge Plan and Services In-house Referral: NA Discharge Planning Services: CM Consult Post Acute Care Choice: Home Health Living arrangements for the past 2 months: Independent Living Facility Expected Discharge Date: 04/01/23               DME Arranged: Negative pressure wound device DME Agency: [facility] Date DME Agency Contacted: 03/31/23 Time DME Agency Contacted: 1300 Representative spoke with at DME Agency: Traci HH Arranged: RN, Disease Management HH Agency: [facility] ([facility]) Date HH Agency Contacted: 04/01/23 Time HH Agency Contacted: 1200 Representative spoke with at [facility] Agency: Morrie Sheldon   Social Determinants of Health (SDOH) Interventions SDOH Screenings   Food Insecurity: No Food Insecurity (03/23/2023)  Housing: Low Risk  (03/23/2023)  Transportation Needs: No Transportation Needs (03/23/2023)  Utilities: Not At Risk (03/23/2023)  Alcohol Screen: Low Risk  (03/08/2023)  Depression (PHQ2-9): Low Risk  (03/08/2023)  Financial Resource Strain: Low Risk  (03/08/2023)  Physical Activity: Insufficiently  Active (03/08/2023)  Social Connections: Moderately Integrated (03/08/2023)  Stress: No Stress Concern Present (03/08/2023)  Tobacco Use: Low Risk  (03/29/2023)  Health Literacy: Adequate Health Literacy (03/08/2023)    Readmission Risk Interventions     No data to display

## 2023-04-01 NOTE — Progress Notes (Signed)
  Progress Note    04/01/2023 8:06 AM 3 Days Post-Op  Subjective:  sitting up in bed eating breakfast    Vitals:   03/31/23 2029 04/01/23 0606  BP: 121/63 109/66  Pulse: 63 69  Resp: 19 16  Temp: 98.9 F (37.2 C) 98.5 F (36.9 C)  SpO2: 99% 100%    Physical Exam: Lungs:  nonlabored Incisions:  left sided chest wound vac with good seal  CBC    Component Value Date/Time   WBC 9.5 04/01/2023 0705   RBC 2.65 (L) 04/01/2023 0705   HGB 8.0 (L) 04/01/2023 0705   HGB 16.6 12/04/2020 1445   HCT 26.8 (L) 04/01/2023 0705   HCT 50.1 12/04/2020 1445   PLT 159 04/01/2023 0705   PLT 158 12/04/2020 1445   MCV 101.1 (H) 04/01/2023 0705   MCV 94 12/04/2020 1445   MCH 30.2 04/01/2023 0705   MCHC 29.9 (L) 04/01/2023 0705   RDW 19.8 (H) 04/01/2023 0705   RDW 13.7 12/04/2020 1445   LYMPHSABS 0.4 (L) 03/23/2023 1018   MONOABS 1.0 03/23/2023 1018   EOSABS 0.1 03/23/2023 1018   BASOSABS 0.0 03/23/2023 1018    BMET    Component Value Date/Time   NA 134 (L) 04/01/2023 0705   NA 139 01/29/2021 0853   K 4.4 04/01/2023 0705   CL 101 04/01/2023 0705   CO2 24 04/01/2023 0705   GLUCOSE 92 04/01/2023 0705   BUN 23 04/01/2023 0705   BUN 18 01/29/2021 0853   CREATININE 1.57 (H) 04/01/2023 0705   CREATININE 1.22 (H) 09/12/2015 0846   CALCIUM 8.9 04/01/2023 0705   GFRNONAA 44 (L) 04/01/2023 0705   GFRAA 56 (L) 04/30/2020 1144    INR    Component Value Date/Time   INR 1.1 04/01/2023 0705     Intake/Output Summary (Last 24 hours) at 04/01/2023 0806 Last data filed at 03/31/2023 2202 Gross per 24 hour  Intake 243 ml  Output 225 ml  Net 18 ml      Assessment/Plan:  82 y.o. male is 3 days post op, s/p: left chest washout and vac placement  -Left sided chest wound vac with good seal and no reaccumulation of fluid -Okay for discharge from vascular standpoint as long as he has home health and a home wound vac arranged -He is scheduled for outpatient wound vac change on Wednesday  in the OR. After that, he will require HHRN wound vac changes 2-3x weekly   [name] PA-C [facility] [phone] 04/01/2023 8:06 AM

## 2023-04-01 NOTE — Discharge Instructions (Addendum)
You're being discharged home on lovenox with coumadin bridge. Please check your INR at home and call cardiology office for further coumadin dose adjustments.   Information on my medicine - Coumadin   (Warfarin)  This medication education was reviewed with me or my healthcare representative as part of my discharge preparation.  The pharmacist that spoke with me during my hospital stay was:  Mosetta Anis, [facility]  Why was Coumadin prescribed for you? Coumadin was prescribed for you because you have a blood clot or a medical condition that can cause an increased risk of forming blood clots. Blood clots can cause serious health problems by blocking the flow of blood to the heart, lung, or brain. Coumadin can prevent harmful blood clots from forming. As a reminder your indication for Coumadin is:  Blood Clot Prevention after Heart Valve Surgery  What test will check on my response to Coumadin? While on Coumadin (warfarin) you will need to have an INR test regularly to ensure that your dose is keeping you in the desired range. The INR (international normalized ratio) number is calculated from the result of the laboratory test called prothrombin time (PT).  If an INR APPOINTMENT HAS NOT ALREADY BEEN MADE FOR YOU please schedule an appointment to have this lab work done by your health care provider within 7 days. Your INR goal is usually a number between:  2 to 3 or your provider may give you a more narrow range like 2-2.5.  Ask your health care provider during an office visit what your goal INR is.  What  do you need to  know  About  COUMADIN? Take Coumadin (warfarin) exactly as prescribed by your healthcare provider about the same time each day.  DO NOT stop taking without talking to the doctor who prescribed the medication.  Stopping without other blood clot prevention medication to take the place of Coumadin may increase your risk of developing a new clot or stroke.  Get refills before you run  out.  What do you do if you miss a dose? If you miss a dose, take it as soon as you remember on the same day then continue your regularly scheduled regimen the next day.  Do not take two doses of Coumadin at the same time.  Important Safety Information A possible side effect of Coumadin (Warfarin) is an increased risk of bleeding. You should call your healthcare provider right away if you experience any of the following: Bleeding from an injury or your nose that does not stop. Unusual colored urine (red or dark brown) or unusual colored stools (red or black). Unusual bruising for unknown reasons. A serious fall or if you hit your head (even if there is no bleeding).  Some foods or medicines interact with Coumadin (warfarin) and might alter your response to warfarin. To help avoid this: Eat a balanced diet, maintaining a consistent amount of Vitamin K. Notify your provider about major diet changes you plan to make. Avoid alcohol or limit your intake to 1 drink for women and 2 drinks for men per day. (1 drink is 5 oz. wine, 12 oz. beer, or 1.5 oz. liquor.)  Make sure that ANY health care provider who prescribes medication for you knows that you are taking Coumadin (warfarin).  Also make sure the healthcare provider who is monitoring your Coumadin knows when you have started a new medication including herbals and non-prescription products.  Coumadin (Warfarin)  Major Drug Interactions  Increased Warfarin Effect Decreased Warfarin Effect  Alcohol (large quantities) Antibiotics (esp. Septra/Bactrim, Flagyl, Cipro) Amiodarone (Cordarone) Aspirin (ASA) Cimetidine (Tagamet) Megestrol (Megace) NSAIDs (ibuprofen, naproxen, etc.) Piroxicam (Feldene) Propafenone (Rythmol SR) Propranolol (Inderal) Isoniazid (INH) Posaconazole (Noxafil) Barbiturates (Phenobarbital) Carbamazepine (Tegretol) Chlordiazepoxide (Librium) Cholestyramine (Questran) Griseofulvin Oral  Contraceptives Rifampin Sucralfate (Carafate) Vitamin K   Coumadin (Warfarin) Major Herbal Interactions  Increased Warfarin Effect Decreased Warfarin Effect  Garlic Ginseng Ginkgo biloba Coenzyme Q10 Green tea St. John's wort    Coumadin (Warfarin) FOOD Interactions  Eat a consistent number of servings per week of foods HIGH in Vitamin K (1 serving =  cup)  Collards (cooked, or boiled & drained) Kale (cooked, or boiled & drained) Mustard greens (cooked, or boiled & drained) Parsley *serving size only =  cup Spinach (cooked, or boiled & drained) Swiss chard (cooked, or boiled & drained) Turnip greens (cooked, or boiled & drained)  Eat a consistent number of servings per week of foods MEDIUM-HIGH in Vitamin K (1 serving = 1 cup)  Asparagus (cooked, or boiled & drained) Broccoli (cooked, boiled & drained, or raw & chopped) Brussel sprouts (cooked, or boiled & drained) *serving size only =  cup Lettuce, raw (green leaf, endive, romaine) Spinach, raw Turnip greens, raw & chopped   These websites have more information on Coumadin (warfarin):  http://www.king-russell.com/; https://www.hines.net/;

## 2023-04-01 NOTE — TOC Benefit Eligibility Note (Signed)
Pharmacy Patient Advocate Encounter  Insurance verification completed.    The patient is insured through  [employer]    Ran test claim for Enoxaparin. Currently a quantity of 10 is a 5 day supply and the co-pay is $21.19 .   This test claim was processed through [facility]- copay amounts may vary at other pharmacies due to pharmacy/plan contracts, or as the patient moves through the different stages of their insurance plan.

## 2023-04-01 NOTE — Discharge Summary (Signed)
Physician Discharge Summary  Austin [name] ZOX:[id number] DOB: 09-Aug-1940 DOA: 03/23/2023  PCP: [name], MD  Admit date: 03/23/2023 Discharge date: 04/01/2023  Admitted From: Independent living facility Disposition: Same  Recommendations for Outpatient Follow-up:  Follow up with PCP  Follow-up with vascular surgery for outpatient wound VAC change Lovenox/Coumadin bridge.  Repeat INR outpatient.  Discharge Condition: Stable CODE STATUS: Full code Diet recommendation: Heart healthy diet  Brief/Interim Summary: Austin [name] is a 82 y.o. 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  Progressive over the last 2 days with recent Barostim placement by vascular surgery on 03/17/2023 due to symptomatic advanced CHF.  Also endorses shortness of breath.  Denies fever, no chills, no abdominal pain, no nausea/vomiting/diarrhea.   In the ED, temperature 97.5 F, HR 61, RR 18, BP 82/62, SpO2 94% on room air.  WBC 9.6, hemoglobin 9.3, platelet count 151.  Sodium 135, potassium 4.4, chloride 97, CO2 28, glucose 131, BUN 48, creatinine 2.44.  BNP 352.5.  INR 2.0.  Chest x-ray with new stimulator implant left chest wall with prominent asymmetric soft tissue density surrounding the stimulator in the left chest wall concerning for hematoma, no acute cardiopulmonary disease process.  Vascular surgery was consulted.  Patient was given 500 mL IV fluid bolus, IV fentanyl/morphine, Zofran in the ED.  Warfarin was held  and patient started on a heparin bridge given mechanical mitral valve.  TRH consulted for admission for further evaluation management of acute blood loss anemia secondary to hematoma surrounding recent surgical site.   Patient underwent I&D of left chest wall x2 with wound VAC placement.  Discharge Diagnoses:   Principal Problem:   Fluid collection at surgical site, initial encounter Active Problems:   Atrial fibrillation (HCC)   Chronic systolic heart failure (HCC)   ICD (implantable cardioverter-defibrillator) in place   H/O mitral valve replacement with mechanical valve   S/P ablation of ventricular arrhythmia   VT (ventricular tachycardia) (HCC)   Hypothyroidism due to medication   Stage 3b chronic kidney disease (HCC)   Callous ulcer, limited to breakdown of skin (HCC)   OSA on CPAP   Acute blood loss anemia Left chest wall hematoma/seroma Patient presenting to ED with enlarging left chest region surrounding his surgical site associated with chest discomfort and shortness of breath.  Recently underwent Barostim placement by vascular surgery, [name] on 03/17/2023 due to symptomatic events congestive heart failure.  Likely complicated by his need of chronic anticoagulation with Coumadin due to mechanical mitral valve.  Anemia panel with iron 60, TIBC 266, ferritin 144, folate 24.8, vitamin B12 537.  Vascular surgery was consulted and patient underwent I&D of left chest wall with placement of antibiotic beads, skin substitute, and wound VAC by [name] on 03/25/2023.  Repeat I&D on 03/29/2023 with 600 mL serous fluid removal with replacement of wound VAC. -- S/p 1 unit pRBC 8/29 and 8/30, 2u FFP 8/30; 1 u pRBC 8/31 -- Outpatient wound VAC change next week, home health wound VAC changes after -- Augmentin, Doxy until wound VAC change    Permanent atrial  fibrillation/atypical flutter -- Amiodarone.  IV heparin--> Coumadin/Lovenox    History of ventricular tachycardia -- Quinidine,  amiodarone   Hx complete heart block s/p PPM -- Telemetry   Chronic systolic congestive heart failure, compensated Follows with advanced heart failure clinic, last TTE with LVEF 20-25%, indeterminate diastolic dysfunction, mildly reduced RV function.  S/p AICD/PPM.  Recently underwent Bariostim implantation on 8/22 by vascular surgery for symptomatic advanced heart failure. -- Holding home metoprolol, valsartan, spironolactone, Lasix given hypotension -- Continue monitor BP closely -- Strict I's and O's and daily weights   Hx MV endocarditis s/p mechanical mitral valve replacement -- IV heparin--> Coumadin/Lovenox    CKD stage IIIb -- Baseline Cr 2.2 - 2.7.  Stable   Hypothyroidism -- Levothyroxine   Foot callus -- Follows with podiatry, [name] outpatient.  Pending outpatient MRI.  Outpatient follow-up with podiatry/vascular surgery.   OSA: -- Continue nocturnal CPAP    Discharge Instructions  Discharge Instructions     (HEART FAILURE PATIENTS) Call MD:  Anytime you have any of the following symptoms: 1) 3 pound weight gain in 24 hours or 5 pounds in 1 week 2) shortness of breath, with or without a dry hacking cough 3) swelling in the hands, feet or stomach 4) if you have to sleep on extra pillows at night in order to breathe.   Complete by: As directed    Call MD for:  difficulty breathing, headache or visual disturbances   Complete by: As directed    Call MD for:  extreme fatigue   Complete by: As directed    Call MD for:  persistant dizziness or light-headedness   Complete by: As directed    Call MD for:  persistant nausea and vomiting   Complete by: As directed    Call MD for:  severe uncontrolled pain   Complete by: As directed    Call MD for:  temperature >100.4   Complete by: As directed    Diet - low sodium heart healthy   Complete by: As directed    Discharge instructions   Complete by: As directed    You were cared for by a hospitalist during your  hospital stay. If you have any questions about your discharge medications or the care you received while you were in the hospital after you are discharged, you can call the unit and ask to speak with the hospitalist on call if the hospitalist that took care of you is not available. Once you are discharged, your primary care physician will handle any further medical issues. Please note that NO REFILLS for any discharge medications will be authorized once you are discharged, as it is imperative that you return to your primary care physician (or establish a relationship with a primary care physician if you do not have one) for your aftercare needs so that they can reassess your need for medications and monitor your lab values.   Increase activity slowly   Complete by: As directed    No wound care   Complete by: As directed       Allergies as of 04/01/2023   No Known Allergies      Medication List     STOP taking these medications    furosemide 20 MG tablet Commonly known as: LASIX   metoprolol succinate 25 MG 24 hr tablet Commonly known as: TOPROL-XL   spironolactone 25 MG tablet Commonly known as: ALDACTONE   valsartan 40 MG tablet Commonly known as: DIOVAN  TAKE these medications    acetaminophen 500 MG tablet Commonly known as: TYLENOL Take 1,000 mg by mouth every 6 (six) hours as needed for mild pain.   amiodarone 200 MG tablet Commonly known as: PACERONE Take 1 tablet (200 mg total) by mouth daily. Do NOT take Amiodarone on Wednesday or Sunday.   amoxicillin 500 MG capsule Commonly known as: AMOXIL TAKE 4 CAPSULES BY MOUTH 30 MINUTES PRIOR TO DENTAL WORK   amoxicillin-clavulanate 875-125 MG tablet Commonly known as: AUGMENTIN Take 1 tablet by mouth every 12 (twelve) hours for 5 days.   doxycycline 100 MG tablet Commonly known as: VIBRA-TABS Take 1 tablet (100 mg total) by mouth every 12 (twelve) hours for 5 days.   enoxaparin 100 MG/ML injection Commonly  known as: LOVENOX Inject 1 mL (100 mg total) into the skin every 12 (twelve) hours for 5 days. What changed:  medication strength how much to take   HYDROcodone-acetaminophen 5-325 MG tablet Commonly known as: Norco Take 1 tablet by mouth every 6 (six) hours as needed for moderate pain.   levothyroxine 88 MCG tablet Commonly known as: SYNTHROID TAKE 1 TABLET BY MOUTH EVERY DAY   melatonin 5 MG Tabs Take 5 mg by mouth at bedtime.   multivitamin with minerals Tabs tablet Take 1 tablet by mouth in the morning.   mupirocin ointment 2 % Commonly known as: BACTROBAN Apply 1 Application topically daily.   nitroGLYCERIN 0.4 MG SL tablet Commonly known as: NITROSTAT Place 1 tablet (0.4 mg total) under the tongue every 5 (five) minutes x 3 doses as needed for chest pain.   quiniDINE gluconate 324 MG CR tablet TAKE 1 TABLET BY MOUTH 2 TIMES DAILY.   warfarin 5 MG tablet Commonly known as: COUMADIN Take as directed. If you are unsure how to take this medication, talk to your nurse or doctor. Original instructions: TAKE AS DIRECTED BY THE COUMADIN CLINIC What changed: See the new instructions.   zinc gluconate 50 MG tablet Take 1 tablet (50 mg total) by mouth daily.        Follow-up Information     [name], MD Follow up.   Specialty: Internal Medicine Contact information: [address] [place] [address] [phone]                No Known Allergies    Procedures/Studies: [place] EKG SITE RITE  Result Date: 03/24/2023 If Site Rite image not attached, placement could not be confirmed due to current cardiac rhythm.  CUP PACEART REMOTE DEVICE CHECK  Result Date: 03/23/2023 Scheduled remote reviewed. Normal device function.  PAF, Burden 0.2%, longest 3 min 6 sec, on warfarin per Epic. Next remote 91 days. MC, CVRS.  DG Chest Portable 1 View  Result Date: 03/23/2023 CLINICAL DATA:  Severe left-sided chest pain and swelling over the past 2 days.  Recent left chest wall stimulator placement 6 days ago. Hypotension. EXAM: PORTABLE CHEST 1 VIEW COMPARISON:  Chest x-ray dated July 05, 2022. FINDINGS: New stimulator implant in the left chest wall with single lead terminating in the region of the left carotid artery. There is prominent asymmetric soft tissue density surrounding the stimulator in the left chest wall. Unchanged right chest wall AICD. Stable cardiomegaly status post mitral valve replacement. Low lung volumes are present, causing crowding of the pulmonary vasculature. No focal consolidation, pleural effusion, or pneumothorax. No acute osseous abnormality. IMPRESSION: 1. New stimulator implant in the left chest wall with prominent asymmetric soft tissue density surrounding  the stimulator in the left chest wall, concerning for hematoma. 2. No acute cardiopulmonary disease. Electronically Signed   By: Obie Dredge M.D.   On: 03/23/2023 10:28       Discharge Exam: Vitals:   03/31/23 2029 04/01/23 0606  BP: 121/63 109/66  Pulse: 63 69  Resp: 19 16  Temp: 98.9 F (37.2 C) 98.5 F (36.9 C)  SpO2: 99% 100%    General: Pt is alert, awake, not in acute distress Cardiovascular: S1/S2 +, no edema Respiratory: CTA bilaterally, no wheezing, no rhonchi, no respiratory distress, no conversational dyspnea  Abdominal: Soft, NT, ND, bowel sounds + Extremities: no edema, no cyanosis Psych: Normal mood and affect, stable judgement and insight     The results of significant diagnostics from this hospitalization (including imaging, microbiology, ancillary and laboratory) are listed below for reference.     Microbiology: Recent Results (from the past 240 hour(s))  Surgical pcr screen     Status: None   Collection Time: 03/25/23  4:41 AM   Specimen: Nasal Mucosa; Nasal Swab  Result Value Ref Range Status   MRSA, PCR NEGATIVE NEGATIVE Final   Staphylococcus aureus NEGATIVE NEGATIVE Final    Comment: (NOTE) The Xpert SA Assay (FDA  approved for NASAL specimens in patients 20 years of age and older), is one component of a comprehensive surveillance program. It is not intended to diagnose infection nor to guide or monitor treatment. Performed at [facility], [address], [place], [place] [address]      Labs: BNP (last 3 results) Recent Labs    12/08/22 1612 02/03/23 1502 03/23/23 1045  BNP 723.4* 453.3* 352.5*   Basic Metabolic Panel: Recent Labs  Lab 03/27/23 0502 03/29/23 0535 03/30/23 0545 03/31/23 0434 04/01/23 0705  NA 135 133* 136 135 134*  K 4.5 4.6 4.3 4.4 4.4  CL 102 100 101 104 101  CO2 27 24 25 24 24   GLUCOSE 104* 91 83 99 92  BUN 45* 39* 28* 25* 23  CREATININE 2.33* 1.63* 1.35* 1.65* 1.57*  CALCIUM 8.8* 8.7* 8.7* 8.7* 8.9   Liver Function Tests: No results for input(s): "AST", "ALT", "ALKPHOS", "BILITOT", "PROT", "ALBUMIN" in the last 168 hours. No results for input(s): "LIPASE", "AMYLASE" in the last 168 hours. No results for input(s): "AMMONIA" in the last 168 hours. CBC: Recent Labs  Lab 03/28/23 0306 03/29/23 0535 03/30/23 0545 03/31/23 0434 04/01/23 0705  WBC 11.8* 9.3 8.2 8.6 9.5  HGB 8.2* 7.6* 7.5* 7.8* 8.0*  HCT 26.3* 24.2* 24.2* 25.1* 26.8*  MCV 96.3 96.0 96.8 98.0 101.1*  PLT 161 140* 144* 148* 159   Cardiac Enzymes: No results for input(s): "CKTOTAL", "CKMB", "CKMBINDEX", "TROPONINI" in the last 168 hours. BNP: Invalid input(s): "POCBNP" CBG: No results for input(s): "GLUCAP" in the last 168 hours. D-Dimer No results for input(s): "DDIMER" in the last 72 hours. Hgb A1c No results for input(s): "HGBA1C" in the last 72 hours. Lipid Profile No results for input(s): "CHOL", "HDL", "LDLCALC", "TRIG", "CHOLHDL", "LDLDIRECT" in the last 72 hours. Thyroid function studies No results for input(s): "TSH", "T4TOTAL", "T3FREE", "THYROIDAB" in the last 72 hours.  Invalid input(s): "FREET3" Anemia work up No results for input(s): "VITAMINB12", "FOLATE",  "FERRITIN", "TIBC", "IRON", "RETICCTPCT" in the last 72 hours. Urinalysis    Component Value Date/Time   COLORURINE YELLOW 03/17/2023 0755   APPEARANCEUR CLEAR 03/17/2023 0755   LABSPEC 1.013 03/17/2023 0755   PHURINE 5.0 03/17/2023 0755   GLUCOSEU NEGATIVE 03/17/2023 0755  HGBUR NEGATIVE 03/17/2023 0755   BILIRUBINUR NEGATIVE 03/17/2023 0755   KETONESUR NEGATIVE 03/17/2023 0755   PROTEINUR NEGATIVE 03/17/2023 0755   UROBILINOGEN 0.2 08/14/2021 1612   NITRITE NEGATIVE 03/17/2023 0755   LEUKOCYTESUR NEGATIVE 03/17/2023 0755   Sepsis Labs Recent Labs  Lab 03/29/23 0535 03/30/23 0545 03/31/23 0434 04/01/23 0705  WBC 9.3 8.2 8.6 9.5   Microbiology Recent Results (from the past 240 hour(s))  Surgical pcr screen     Status: None   Collection Time: 03/25/23  4:41 AM   Specimen: Nasal Mucosa; Nasal Swab  Result Value Ref Range Status   MRSA, PCR NEGATIVE NEGATIVE Final   Staphylococcus aureus NEGATIVE NEGATIVE Final    Comment: (NOTE) The Xpert SA Assay (FDA approved for NASAL specimens in patients 18 years of age and older), is one component of a comprehensive surveillance program. It is not intended to diagnose infection nor to guide or monitor treatment. Performed at [facility], [address], [place], [place] [address]      Patient was seen and examined on the day of discharge and was found to be in stable condition. Time coordinating discharge: 40 minutes including assessment and coordination of care, as well as examination of the patient.   SIGNED:  Noralee Stain, DO [facility] 04/01/2023, 11:54 AM

## 2023-04-01 NOTE — Plan of Care (Signed)

## 2023-04-01 NOTE — Plan of Care (Signed)
  Problem: Education: Goal: Knowledge of General Education information will improve Description: Including pain rating scale, medication(s)/side effects and non-pharmacologic comfort measures 04/01/2023 1502 by Herma Carson, RN Outcome: Adequate for Discharge 04/01/2023 0747 by Herma Carson, RN Outcome: Progressing   Problem: Health Behavior/Discharge Planning: Goal: Ability to manage health-related needs will improve 04/01/2023 1502 by Herma Carson, RN Outcome: Adequate for Discharge 04/01/2023 0747 by Herma Carson, RN Outcome: Progressing   Problem: Clinical Measurements: Goal: Ability to maintain clinical measurements within normal limits will improve 04/01/2023 1502 by Herma Carson, RN Outcome: Adequate for Discharge 04/01/2023 0747 by Herma Carson, RN Outcome: Progressing Goal: Will remain free from infection 04/01/2023 1502 by Herma Carson, RN Outcome: Adequate for Discharge 04/01/2023 0747 by Herma Carson, RN Outcome: Progressing Goal: Diagnostic test results will improve 04/01/2023 1502 by Herma Carson, RN Outcome: Adequate for Discharge 04/01/2023 0747 by Herma Carson, RN Outcome: Progressing Goal: Respiratory complications will improve 04/01/2023 1502 by Herma Carson, RN Outcome: Adequate for Discharge 04/01/2023 0747 by Herma Carson, RN Outcome: Progressing Goal: Cardiovascular complication will be avoided 04/01/2023 1502 by Herma Carson, RN Outcome: Adequate for Discharge 04/01/2023 0747 by Herma Carson, RN Outcome: Progressing   Problem: Activity: Goal: Risk for activity intolerance will decrease 04/01/2023 1502 by Herma Carson, RN Outcome: Adequate for Discharge 04/01/2023 0747 by Herma Carson, RN Outcome: Progressing   Problem: Nutrition: Goal: Adequate nutrition will be maintained 04/01/2023 1502 by Herma Carson, RN Outcome: Adequate for Discharge 04/01/2023 0747 by Herma Carson, RN Outcome: Progressing   Problem:  Coping: Goal: Level of anxiety will decrease 04/01/2023 1502 by Herma Carson, RN Outcome: Adequate for Discharge 04/01/2023 0747 by Herma Carson, RN Outcome: Progressing   Problem: Elimination: Goal: Will not experience complications related to bowel motility 04/01/2023 1502 by Herma Carson, RN Outcome: Adequate for Discharge 04/01/2023 0747 by Herma Carson, RN Outcome: Progressing Goal: Will not experience complications related to urinary retention 04/01/2023 1502 by Herma Carson, RN Outcome: Adequate for Discharge 04/01/2023 0747 by Herma Carson, RN Outcome: Progressing   Problem: Pain Managment: Goal: General experience of comfort will improve 04/01/2023 1502 by Herma Carson, RN Outcome: Adequate for Discharge 04/01/2023 0747 by Herma Carson, RN Outcome: Progressing   Problem: Safety: Goal: Ability to remain free from injury will improve 04/01/2023 1502 by Herma Carson, RN Outcome: Adequate for Discharge 04/01/2023 0747 by Herma Carson, RN Outcome: Progressing   Problem: Skin Integrity: Goal: Risk for impaired skin integrity will decrease 04/01/2023 1502 by Herma Carson, RN Outcome: Adequate for Discharge 04/01/2023 0747 by Herma Carson, RN Outcome: Progressing

## 2023-04-04 ENCOUNTER — Telehealth: Payer: Self-pay

## 2023-04-04 ENCOUNTER — Ambulatory Visit (INDEPENDENT_AMBULATORY_CARE_PROVIDER_SITE_OTHER): Payer: Medicare Other

## 2023-04-04 ENCOUNTER — Encounter: Payer: Self-pay | Admitting: Surgery

## 2023-04-04 ENCOUNTER — Encounter: Payer: Self-pay | Admitting: Internal Medicine

## 2023-04-04 DIAGNOSIS — Z5181 Encounter for therapeutic drug level monitoring: Secondary | ICD-10-CM | POA: Diagnosis not present

## 2023-04-04 DIAGNOSIS — N179 Acute kidney failure, unspecified: Secondary | ICD-10-CM

## 2023-04-04 DIAGNOSIS — Z79899 Other long term (current) drug therapy: Secondary | ICD-10-CM

## 2023-04-04 LAB — POCT INR: INR: 1.6 — AB (ref 2.0–3.0)

## 2023-04-04 NOTE — Transitions of Care (Post Inpatient/ED Visit) (Signed)
04/04/2023  Name: Austin [name] MRN: [id number] DOB: 1941-01-07  Today's TOC FU Call Status: Today's TOC FU Call Status:: Successful TOC FU Call Completed TOC FU Call Complete Date: 04/04/23 (Incoming call from pt returning RN CM call.) Patient's Name and Date of Birth confirmed.  Transition Care Management Follow-up Telephone Call Date of Discharge: 04/01/23 Discharge Facility: [facility]) Type of Discharge: Inpatient Admission Primary Inpatient Discharge Diagnosis:: "post op check, fluid collection at surgical site" How have you been since you were released from the hospital?: Same (P states "trouble sleeping at night-will try taking pain med prior to bedtime to see if that helps"-denies any pain at this time-wound VAC in place-no isssues, has some swelling to lower extremity-wgt 205 today was 207 yest.-daughter has contacted MD) Any questions or concerns?: Yes Patient Questions/Concerns:: Pt states he has not heard form surgery office recalling further info/instructions Patient Questions/Concerns Addressed: Other: (Discussed with pt that nurse from office had tried to reach him earlier today and left him a M msessage per note. Provided pt with office number to call and follow up with them.)  Items Reviewed: Did you receive and understand the discharge instructions provided?: Yes Medications obtained,verified, and reconciled?: Yes (Medications Reviewed) Any new allergies since your discharge?: No Dietary orders reviewed?: Yes Type of Diet Ordered:: low salt/heart ehalthy Do you have support at home?: Yes People in Home: child(ren), adult, spouse Name of Support/Comfort Primary Source: pt resides at [facility]  Medications Reviewed Today: Medications Reviewed Today     Reviewed by Charlyn Minerva, RN (Registered Nurse) on 04/04/23 at 1559  Med List Status: <None>   Medication Order Taking? Sig Documenting Provider Last Dose Status Informant   acetaminophen (TYLENOL) 500 MG tablet [id number] Yes Take 1,000 mg by mouth every 6 (six) hours as needed for mild pain. [provider] Taking Active Self  amiodarone (PACERONE) 200 MG tablet [id number] Yes Take 1 tablet (200 mg total) by mouth daily. Do NOT take Amiodarone on Wednesday or Sunday. Laurey Morale, MD Taking Active Self  amoxicillin (AMOXIL) 500 MG capsule [id number]  TAKE 4 CAPSULES BY MOUTH 30 MINUTES PRIOR TO DENTAL WORK Marinus Maw, MD  Active Self           Med Note Lenoria Farrier   Fri Mar 11, 2023 11:54 AM) Dental procedure only  amoxicillin-clavulanate (AUGMENTIN) 875-125 MG tablet [id number] Yes Take 1 tablet by mouth every 12 (twelve) hours for 5 days. Noralee Stain, DO Taking Active   doxycycline (VIBRA-TABS) 100 MG tablet [id number] Yes Take 1 tablet (100 mg total) by mouth every 12 (twelve) hours for 5 days. Noralee Stain, DO Taking Active   enoxaparin (LOVENOX) 100 MG/ML injection [id number] Yes Inject 1 mL (100 mg total) into the skin every 12 (twelve) hours for 5 days. Noralee Stain, DO Taking Active   HYDROcodone-acetaminophen [facility]) 5-325 MG tablet [id number] Yes Take 1 tablet by mouth every 6 (six) hours as needed for moderate pain. [name] P, PA-C Taking Active Self  levothyroxine (SYNTHROID) 88 MCG tablet [id number] Yes TAKE 1 TABLET BY MOUTH EVERY DAY [name], MD Taking Active Self  melatonin 5 MG TABS [id number] Yes Take 5 mg by mouth at bedtime. [provider] Taking Active Self  Multiple Vitamin (MULTIVITAMIN WITH MINERALS) TABS tablet [id number] Yes Take 1 tablet by mouth in the morning. [provider] Taking Active Self  mupirocin ointment (BACTROBAN) 2 % [id number] Yes Apply 1 Application topically daily. Sharl Ma  R, DPM Taking Active Self  nitroGLYCERIN (NITROSTAT) 0.4 MG SL tablet [id number] Yes Place 1 tablet (0.4 mg total) under the tongue every 5 (five) minutes x 3 doses as needed for chest pain.  Abelino Derrick, PA-C Taking Active Self           Med Note (SATTER[place], Genoveva Ill   Wed Mar 23, 2023  2:40 PM)    quiniDINE gluconate 324 MG CR tablet [id number] Yes TAKE 1 TABLET BY MOUTH 2 TIMES DAILY. Laurey Morale, MD Taking Active Self  warfarin (COUMADIN) 5 MG tablet [id number] Yes TAKE AS DIRECTED BY THE COUMADIN CLINIC  Patient taking differently: Take 2.5-5 mg by mouth See admin instructions. Take 5 mg by mouth on Mon., Wed., and Friday in the evening, all the other days take 2.5 mg by mouth in the evening TAKE AS DIRECTED BY THE COUMADIN CLINIC   Laurey Morale, MD Taking Active Self  zinc gluconate 50 MG tablet [id number] Yes Take 1 tablet (50 mg total) by mouth daily. [name], MD Taking Active Self            Home Care and Equipment/Supplies: Were Home Health Services Ordered?: Yes Name of Home Health Agency:: [facility] Has Agency set up a time to come to your home?: No (Per inpt notes HHRN servcies to start after pt hss procedure on 04/06/23 per MD orders-pt aware of this and voiced understanding) Any new equipment or medical supplies ordered?: Yes Name of Medical supply agency?: [facility] Were you able to get the equipment/medical supplies?: Yes Do you have any questions related to the use of the equipment/supplies?: No  Functional Questionnaire: Do you need assistance with bathing/showering or dressing?: No Do you need assistance with meal preparation?: No Do you need assistance with eating?: No Do you have difficulty maintaining continence: No Do you need assistance with getting out of bed/getting out of a chair/moving?: No Do you have difficulty managing or taking your medications?: No  Follow up appointments reviewed: PCP Follow-up appointment confirmed?: No (Pt did not wish to make PCP appt until after he has gotten further details/info on procedure next week and scheduling info) MD Provider Line Number:[phone] Given: No Specialist Hospital  Follow-up appointment confirmed?: Yes Date of Specialist follow-up appointment?: 04/06/23 Follow-Up Specialty Provider:: [name] Do you need transportation to your follow-up appointment?: No Do you understand care options if your condition(s) worsen?: Yes-patient verbalized understanding  SDOH Interventions Today    Flowsheet Row Most Recent Value  SDOH Interventions   Food Insecurity Interventions Intervention Not Indicated  Transportation Interventions Intervention Not Indicated      TOC Interventions Today    Flowsheet Row Most Recent Value  TOC Interventions   TOC Interventions Discussed/Reviewed TOC Interventions Discussed      Interventions Today    Flowsheet Row Most Recent Value  Chronic Disease   Chronic disease during today's visit Atrial Fibrillation (AFib), Congestive Heart Failure (CHF)  General Interventions   General Interventions Discussed/Reviewed General Interventions Discussed, Doctor Visits, Durable Medical Equipment (DME)  Doctor Visits Discussed/Reviewed Doctor Visits Discussed, PCP, Specialist  Durable Medical Equipment (DME) BP Cuff, Other  PCP/Specialist Visits Compliance with follow-up visit  Education Interventions   Education Provided Provided Education  Provided Verbal Education On Nutrition, When to see the doctor, Other  [sx mgmt-fluid retention-instructed to elevate feet, use compression socks which he says he has, limit sodium and water intake-reviewed s/s of worsening condition and when to seek medical attention]  Nutrition  Interventions   Nutrition Discussed/Reviewed Nutrition Discussed, Fluid intake, Decreasing fats, Decreasing salt, Adding fruits and vegetables  Pharmacy Interventions   Pharmacy Dicussed/Reviewed Pharmacy Topics Discussed, Medications and their functions  [Lovenox injections reviewed pt is self-administering his own injections]  Safety Interventions   Safety Discussed/Reviewed Safety Discussed, Home Safety        Alessandra Grout [facility] Care Management Care Management Community Coordinator Direct Phone: [phone] Toll Free: [phone] Fax: [phone]

## 2023-04-04 NOTE — Telephone Encounter (Signed)
Attempted to reach patient to review instructions regarding surgery on 9/11. Left VM for patient to return call.

## 2023-04-04 NOTE — Patient Instructions (Signed)
Description   Continue Lovenox injections. Take 10mg  today and 5mg  tomorrow and then START taking warfarin 1/2 tablet daily except for 1 tablet on Mondays, Wednesdays, and Fridays.  Recheck INR on Friday (Pt stated he has VAC change on 9/11 and INR will be checked at this appt also )   Coumadin Clinic [phone].  AMIO DOSE 200MG  DAILY, 12/07/22 12/08/22-Amio 200mg  qd none Wed/Sun   Pt's cell phone [phone] in case difficulty reaching pt on home phone.

## 2023-04-04 NOTE — Transitions of Care (Post Inpatient/ED Visit) (Signed)
   04/04/2023  Name: Austin [name] MRN: [id number] DOB: February 16, 1941  Today's TOC FU Call Status: Today's TOC FU Call Status:: Unsuccessful Call (1st Attempt) Unsuccessful Call (1st Attempt) Date: 04/04/23  Attempted to reach the patient regarding the most recent Inpatient/ED visit.  Follow Up Plan: Additional outreach attempts will be made to reach the patient to complete the Transitions of Care (Post Inpatient/ED visit) call.     Antionette Fairy, RN,BSN,CCM [facility] Care Management Care Management Community Coordinator Direct Phone: [phone] Toll Free: [phone] Fax: [phone]

## 2023-04-05 ENCOUNTER — Telehealth: Payer: Self-pay

## 2023-04-05 ENCOUNTER — Encounter (HOSPITAL_COMMUNITY): Payer: Self-pay | Admitting: Surgery

## 2023-04-05 ENCOUNTER — Encounter (HOSPITAL_COMMUNITY): Payer: Self-pay | Admitting: Physician Assistant

## 2023-04-05 ENCOUNTER — Encounter: Payer: Self-pay | Admitting: Internal Medicine

## 2023-04-05 ENCOUNTER — Other Ambulatory Visit: Payer: Self-pay

## 2023-04-05 MED ORDER — FUROSEMIDE 20 MG PO TABS: 60.0000 mg | ORAL_TABLET | Freq: Two times a day (BID) | ORAL | 3 refills | Status: DC

## 2023-04-05 NOTE — Progress Notes (Addendum)
PCP - Dr Sanda Linger Cardiologist -  Dr Marca Ancona EP - Dr Lewayne Bunting Neurology - Dr Huston Foley  Chest x-ray - 03/23/23 (1V) EKG - 02/03/23 Stress Test - yrs ago per pt, unknown location, none in EPIC or in [facility] ECHO - 09/28/22 Cardiac Cath - 02/26/21  ICD Pacemaker - Medtronic Pacemaker.  Rep Tobie Lords was emailed with surgical date and time.  [place] from OR desk was informed.  Perioperative Prescription for ICD Programming was initiated and awaiting results.  Last device check was on 03/23/23.  Sleep Study -  Yes, 12/15/22 CPAP per pt. - uses nightly  Diabetes - n/a  Blood Thinner Instructions:  Follow your surgeon's instructions to continue Coumadin and hold Lovenox morning of procedure.  See TE dated 04/05/23.  NPO  Anesthesia review: Yes  STOP now taking any Aspirin (unless otherwise instructed by your surgeon), Aleve, Naproxen, Ibuprofen, Motrin, Advil, Goody's, BC's, all herbal medications, fish oil, and all vitamins.   Coronavirus Screening Do you have any of the following symptoms:  Cough yes/no: No Fever (>100.23F)  yes/no: No Runny nose yes/no: No Sore throat yes/no: No Difficulty breathing/shortness of breath  Yes  Have you traveled in the last 14 days and where? yes/no: No  Patient verbalized understanding of instructions that were given via phone.

## 2023-04-05 NOTE — Telephone Encounter (Addendum)
Spoke with patient and daughter Morrie Sheldon. Patient scheduled for wound vac change on 9/11. Instructions provided. Per [name], patient will continue taking warfarin and hold Lovenox morning of procedure. They both verbalized understanding.

## 2023-04-05 NOTE — Anesthesia Preprocedure Evaluation (Signed)
Anesthesia Evaluation    Airway        Dental   Pulmonary           Cardiovascular      Neuro/Psych    GI/Hepatic   Endo/Other    Renal/GU      Musculoskeletal   Abdominal   Peds  Hematology   Anesthesia Other Findings   Reproductive/Obstetrics                             Anesthesia Physical Anesthesia Plan  ASA:   Anesthesia Plan:    Post-op Pain Management:    Induction:   PONV Risk Score and Plan:   Airway Management Planned:   Additional Equipment:   Intra-op Plan:   Post-operative Plan:   Informed Consent:   Plan Discussed with:   Anesthesia Plan Comments: (PAT note written 04/05/2023 by Shonna Chock, PA-C. For iSTAT on arrival. Defer decision for T&S to surgeon/anesthesia team.   )       Anesthesia Quick Evaluation

## 2023-04-05 NOTE — Progress Notes (Addendum)
Anesthesia Chart Review: SAME DAY WORK-UP  Case: [id number] Date/Time: 04/06/23 1226   Procedure: WOUND VAC CHANGE LEFT CHEST WALL (Left)   Anesthesia type: Choice   Pre-op diagnosis: Chest wall wound   [facility] ROOM 11 / [facility]   Surgeons: Nada Libman, MD       DISCUSSION: Patient is an 82 year old male scheduled for the above procedure. S/p insertion of left Barostim on 03/17/23. He presented to ED on 03/23/23 with hypotension and significant left chest wall swelling concerning for hematoma. HGB down to 9.3 from 11.5 six days prior. INR 2.0. He had been on a Lovenox bridge while transitioning back to warfarin for his mechanical mitral valve and afib. Warfarin held and placed on heparin bridge. S/p I&D demonstrated a seroma and underwent placement of antibiotic beads and skin substitute and placement of wound VAC on 03/25/23 and 03/29/23. Discharged to independent living facility on 04/01/23 with wound VAC and HHRN with plans for out-patient wound VAC change on 04/06/23 in the OR. He received a total of 3 units PRBC and 2 units FFP during hospitalization. Warfarin resumed by discharge.   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   Last visit with HF cardiologist Dr. Shirlee Latch 02/03/23. Lovenox bridge planned while off warfarin for Barostim. In regards to ICD, he noted, "The patient has CHB and has been chronically RV pacing as his LV lead is nonfunctional (when not in VT). This is not ideal  and creates dyssynchrony. Remains symptomatic with limited ability to titrate meds. He has seen Dr. Ladona Ridgel, it would be possible to place left bundle lead but procedure would be complicated. After further discussion with Dr. Ladona Ridgel, it was decided that risk would be too high and left bundle lead will not be attempted."   EP ICD Perioperative Recommendations are pending.  Labs as of 04/01/23 show Creatinine 1.57. He had Creatinine > 2 (~ 2.33-2.74) since 07/2022, but was down to 1.35-1.63 from 03/30/23 - 04/01/23. H/H 8.0/26.8 on 04/01/23, up from 7.8/25.1 on 03/31/23. H/H 11.5/36.1 post-Barostim on 03/17/23, but HGB has been in the 7.2-8.8 range since his admission for chest wall seroma. ISTAT8 is already ordered for day of surgery. INR was 1.6 on 04/04/23. HGB with upward trend by 04/01/23 labs. Case is for wound VAC change, so will defer to surgeon/anesthesia team regarding T&S based on iSTAT results.  Anesthesia team to evaluate on the day of surgery. Per posting, continue warfarin, but hold Lovenox on the morning of surgery.    VS:  BP Readings from Last 3 Encounters:  04/01/23 109/66  03/17/23 103/67  03/14/23 100/67   Pulse Readings from Last 3 Encounters:  04/01/23 60  03/17/23 61  03/14/23 78     PROVIDERS: [name], MD is PCP  Marca Ancona, MD is HF cardiologist Lewayne Bunting, MD is EP cardiologist Huston Foley, MD is neurologist ([facility])    LABS: Most recent lab results in [facility] include: Lab Results  Component Value Date   WBC 9.5 04/01/2023   HGB 8.0 (L) 04/01/2023  HCT 26.8 (L) 04/01/2023   PLT 159 04/01/2023   GLUCOSE 92 04/01/2023   ALT 20 03/24/2023   AST 25 03/24/2023   NA 134 (L) 04/01/2023   K 4.4 04/01/2023   CL 101 04/01/2023   CREATININE 1.57 (H) 04/01/2023   BUN 23 04/01/2023   CO2 24 04/01/2023   TSH 6.129 (H) 02/03/2023   INR 1.6 (A) 04/04/2023    IMAGES: Home Sleep Study 12/15/22: IMPRESSION: Primary Central Sleep Apnea OSA (obstructive sleep  apnea) Nocturnal Hypoxemia RECOMMENDATION:  This home sleep test demonstrates severe sleep disordered breathing with a primary central component and obstructive sleep apnea with a total AHI of 53.3/hour, central apnea index of 31.9/hour and O2 nadir of 68% with significant time below or at 88% saturation of over 180 minutes indicating nocturnal hypoxemia. His average oxygen saturation was only 88%. Moderate to loud snoring was detected consistently throughout the study. Ongoing treatment with positive airway pressure is highly recommended. The patient will be advised to return for a full night PAP titration study for proper treatment settings, O2 monitoring and mask fitting. He may need BiPAP therapy or BiPAP ST. For now, the patient will be advised to continue with his current autoPAP machine....      IMAGES: 1V PCXR 03/23/23: IMPRESSION: 1. New stimulator implant in the left chest wall with prominent asymmetric soft tissue density surrounding the stimulator in the left chest wall, concerning for hematoma. 2. No acute cardiopulmonary disease.       EKG: 02/03/23: AV dual-paced rhythm with occasional ventricular-paced complexes with ventricular escape complexes Abnormal ECG When compared with ECG of 08-Dec-2022 15:31, Premature ventricular complexes are no longer Present Sinus rhythm is now with ventricular escape complexes Vent. rate has decreased BY 4 BPM Confirmed by Orpah Cobb [id number]) on 02/03/2023 3:21:39 PM     CV: US Carotid 01/03/23: Summary:  Right Carotid: Velocities in the right ICA are consistent with a 1-39%  stenosis.  Left Carotid: Velocities in the left ICA are consistent with a 1-39%  stenosis.  Vertebrals: Bilateral vertebral arteries demonstrate antegrade flow.  Subclavians: Normal flow hemodynamics were seen in bilateral subclavian               arteries.    Echo 09/28/22: IMPRESSIONS   1. Left ventricular ejection fraction, by estimation, is 20 to 25%. The   left ventricle has severely decreased function. The left ventricle  demonstrates global hypokinesis. The left ventricular internal cavity size  was mildly to moderately dilated. Left  ventricular diastolic parameters are indeterminate. No LV thrombus.   2. Right ventricular systolic function is mildly reduced. The right  ventricular size is normal. There is mildly elevated pulmonary artery  systolic pressure. The estimated right ventricular systolic pressure is  42.0 mmHg.   3. Left atrial size was moderately dilated.   4. Right atrial size was mildly dilated.   5. Mechanical mitral valve with mean gradient 4, no significant mitral  regurgitation noted. Function appears normal.   6. The tricuspid valve is abnormal. Tricuspid valve regurgitation is  moderate.   7. The aortic valve is tricuspid. There is mild calcification of the  aortic valve. Aortic valve regurgitation is not visualized. No aortic  stenosis is present.   8. Aortic dilatation noted. There is mild dilatation of the aortic root,  measuring 39 mm.   9. The inferior vena cava is dilated in size with <50% respiratory  variability, suggesting right atrial pressure of 15 mmHg.    RHC  02/26/21: Hemodynamics (mmHg) RA mean 4 RV 33/4 PA 32/12, mean 21 PCWP mean 11  Oxygen saturations: PA 71% AO 98%  Cardiac Output (Fick) 4.66  Cardiac Index (Fick) 2.18  1. Normal filling pressures.  2. Low but not markedly low cardiac output.    I will keep him on lower Lasix dose, 20 mg every other day.  He will continue digoxin for now, will check level today.  He decreased amiodarone a couple of days ago.  Still feels poorly => fatigued/tired.  Would give him some time on lower amiodarone dose, if continues to feel poorly, would consider CRT upgrade given chronic RV pacing.  He says that he has an appointment with Dr. Ladona Ridgel in the next month or so.     Past Medical History:  Diagnosis Date   AICD (automatic  cardioverter/defibrillator) present    Atrial fibrillation (HCC)    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    cx by infection, explantation11/12 & reimplant 1/13   CHF (congestive heart failure) (HCC)    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   Conductive hearing loss    uses hearing aids   Dysrhythmia    A-fib   Hypothyroidism    Intraspinal abscess    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   Nonischemic cardiomyopathy (HCC)    Psychosexual dysfunction with inhibited sexual excitement    S/P mitral valve replacement    Syncope and collapse    Unspecified sleep apnea    last sleep study 11/07, uses cpap   Ventricular tachycardia (HCC)    VT (ventricular tachycardia) (HCC)     Past Surgical History:  Procedure Laterality Date   BIV ICD GENERATOR CHANGEOUT N/A 05/04/2017   Procedure: BiV ICD Generator Changeout;  Surgeon: Marinus Maw, MD;  Location: [facility] INVASIVE CV LAB;  Service: Cardiovascular;  Laterality: N/A;   CARDIAC CATHETERIZATION  04/24/2002   CARDIAC VALVE REPLACEMENT     CARDIOVERSION  03/09/2012   Procedure: CARDIOVERSION;  Surgeon: Marinus Maw, MD;  Location: [facility] ENDOSCOPY;  Service: Cardiovascular;  Laterality: N/A;   CARDIOVERSION N/A 11/22/2013   Procedure: CARDIOVERSION;  Surgeon: Thurmon Fair, MD;  Location: [facility] ENDOSCOPY;  Service: Cardiovascular;  Laterality: N/A;   dental implants     ELECTROPHYSIOLOGIC STUDY N/A 09/15/2015   Procedure: Floyce Stakes Ablation;  Surgeon: Marinus Maw, MD;  Location: [facility] INVASIVE CV LAB;  Service: Cardiovascular;  Laterality: N/A;   Evacution of epidural lumbar epidural abscess  1999   INCISION AND DRAINAGE OF WOUND Left 03/25/2023   Procedure: Washout of Chest Wall Hematoma;  Surgeon: Nada Libman, MD;  [facility];  Service: Vascular;  Laterality: Left;   INCISION AND DRAINAGE OF WOUND N/A 03/29/2023   Procedure: IRRIGATION AND DEBRIDEMENT  CHEST WALL;   Surgeon: Nada Libman, MD;  Location: [facility] OR;  Service: Vascular;  Laterality: N/A;   INSERT / REPLACE / REMOVE PACEMAKER  11/2008   MITRAL VALVE REPLACEMENT     w #[id number] [facility]   PERMANENT PACEMAKER INSERTION N/A 08/05/2011   Procedure: PERMANENT PACEMAKER INSERTION;  Surgeon: Marinus Maw, MD;  Location: [facility] CATH LAB;  Service: Cardiovascular;  Laterality: N/A;   RIGHT HEART CATH N/A 02/26/2021   Procedure: RIGHT HEART CATH;  Surgeon: Laurey Morale, MD;  Location: [facility] INVASIVE CV LAB;  Service: Cardiovascular;  Laterality: N/A;   THYROIDECTOMY     TONSILLECTOMY  VALVE REPLACEMENT  2000    MEDICATIONS: No current facility-administered medications for this encounter.    acetaminophen (TYLENOL) 500 MG tablet   amiodarone (PACERONE) 200 MG tablet   amoxicillin (AMOXIL) 500 MG capsule   amoxicillin-clavulanate (AUGMENTIN) 875-125 MG tablet   doxycycline (VIBRA-TABS) 100 MG tablet   enoxaparin (LOVENOX) 100 MG/ML injection   HYDROcodone-acetaminophen (NORCO) 5-325 MG tablet   levothyroxine (SYNTHROID) 88 MCG tablet   melatonin 5 MG TABS   Multiple Vitamin (MULTIVITAMIN WITH MINERALS) TABS tablet   mupirocin ointment (BACTROBAN) 2 %   nitroGLYCERIN (NITROSTAT) 0.4 MG SL tablet   quiniDINE gluconate 324 MG CR tablet   warfarin (COUMADIN) 5 MG tablet   zinc gluconate 50 MG tablet    Shonna Chock, PA-C Surgical Short Stay/Anesthesiology [facility] Phone [phone] [facility] Phone [phone] 04/05/2023 10:09 AM

## 2023-04-05 NOTE — Telephone Encounter (Signed)
Called daughter Morrie Sheldon (on [place]) and spoke with patient both via speakerphone. Pt confirmed that his Furosemide dose prior to Barostim surgery was Furosemide 60 gm twice daily. Will send refills in at this dose. Explained need for repeat labs to check kidney function. BMET entered and scheduled for 04/08/23.  Graciella Freer, PA-C  You17 hours ago (4:28 PM)   He will need a BMET in a couple of days if restarting his lasix;  Looks like it was held with AKI during admission.   Graciella Freer, PA-C  You17 hours ago (4:27 PM)   He likely needs to restart his lasix.   Was he taking 60 mg BID prior to his surgery?  It looks like it was just stopped 9/6, though may have also been held in the hospital .   Will need to forward INR to coumadin clinic.

## 2023-04-05 NOTE — Telephone Encounter (Signed)
Pt called again stating that he has been unsuccessful in getting the leak fixed and now his chest wall is starting to swell.  Reviewed pt's chart, returned call for clarification, two identifiers used. Pt states that there is also an alarm for a blockage. The canister and tubing has been changed and there is no drainage going to the canister because it won't stay on long enough.  Sent a msg to [name] for advice and he stated that he could come into hospital now or wait until his sch surgery in the AM.   Called pt and informed him of advice. Instructed him to try to "milk" the tubing to see if any blockage would be dislodged, although nothing is visible. Also instructed him to call the vac company for some more troubleshooting help. Pt does not want to go to ED. Pt to call back if worsens. Confirmed understanding.

## 2023-04-05 NOTE — Progress Notes (Signed)
PERIOPERATIVE PRESCRIPTION FOR IMPLANTED CARDIAC DEVICE PROGRAMMING  Patient Information: Name:  Austin [name]  DOB:  1941/02/08  MRN:  [id number]  Planned Procedure:  Wound Vac Change Left Chest Wall  Surgeon:  Dr Coral Else  Date of Procedure:  04/06/23  Cautery will be used.  Position during surgery:  supine  Device Information:  Clinic EP Physician:  Lewayne Bunting, MD   Device Type:  Defibrillator Manufacturer and Phone #:  Medtronic: [phone] Pacemaker Dependent?:  No. Date of Last Device Check:  03/23/2023 Normal Device Function?:  Yes.    Electrophysiologist's Recommendations:  Have magnet available. Provide continuous ECG monitoring when magnet is used or reprogramming is to be performed.  Procedure will likely interfere with device function.  Device should be programmed:  Tachy therapies disabled.  Recommendation made due to possible interference with sterile field with magnet placement.  Per Device Clinic Standing Orders, Austin Ke, RN  3:23 PM 04/05/2023

## 2023-04-05 NOTE — Telephone Encounter (Signed)
Pt's daughter, Morrie Sheldon, called stating that at approx 0330 this AM, the pt's wound vac began to alarm with a leak. The pt is able to press on the center and the leak is fixed.  Reviewed pt's chart, returned call for clarification, two identifiers used. Instructed pt and daughter to reinforce with tape to apply enough pressure across the center to stop the leak. They had been unsure about adding tape. Instructed him to call back if he could not achieve a good seal. Confirmed understanding.

## 2023-04-06 ENCOUNTER — Telehealth: Payer: Self-pay

## 2023-04-06 ENCOUNTER — Other Ambulatory Visit: Payer: Self-pay

## 2023-04-06 ENCOUNTER — Inpatient Hospital Stay (HOSPITAL_COMMUNITY)
Admission: EM | Admit: 2023-04-06 | Discharge: 2023-04-22 | DRG: 252 | Disposition: A | Discharge status: Home / Self Care | Payer: Medicare Other | Source: Skilled Nursing Facility | Attending: Cardiology | Admitting: Cardiology

## 2023-04-06 ENCOUNTER — Encounter (HOSPITAL_COMMUNITY): Payer: Self-pay

## 2023-04-06 ENCOUNTER — Emergency Department (HOSPITAL_COMMUNITY): Payer: Medicare Other

## 2023-04-06 ENCOUNTER — Encounter: Payer: Self-pay | Admitting: Internal Medicine

## 2023-04-06 ENCOUNTER — Encounter (HOSPITAL_COMMUNITY)
Admission: EM | Disposition: A | Discharge status: Home / Self Care | Payer: Self-pay | Source: Skilled Nursing Facility | Attending: Cardiology

## 2023-04-06 ENCOUNTER — Ambulatory Visit (HOSPITAL_COMMUNITY): Admission: RE | Admit: 2023-04-06 | Payer: Medicare Other | Source: Home / Self Care | Admitting: Surgery

## 2023-04-06 DIAGNOSIS — Z79899 Other long term (current) drug therapy: Secondary | ICD-10-CM

## 2023-04-06 DIAGNOSIS — I5023 Acute on chronic systolic (congestive) heart failure: Secondary | ICD-10-CM | POA: Diagnosis present

## 2023-04-06 DIAGNOSIS — I442 Atrioventricular block, complete: Secondary | ICD-10-CM | POA: Diagnosis present

## 2023-04-06 DIAGNOSIS — G4733 Obstructive sleep apnea (adult) (pediatric): Secondary | ICD-10-CM | POA: Diagnosis not present

## 2023-04-06 DIAGNOSIS — I4719 Other supraventricular tachycardia: Secondary | ICD-10-CM | POA: Diagnosis not present

## 2023-04-06 DIAGNOSIS — N189 Chronic kidney disease, unspecified: Secondary | ICD-10-CM

## 2023-04-06 DIAGNOSIS — Z952 Presence of prosthetic heart valve: Secondary | ICD-10-CM

## 2023-04-06 DIAGNOSIS — R54 Age-related physical debility: Secondary | ICD-10-CM | POA: Diagnosis not present

## 2023-04-06 DIAGNOSIS — I472 Ventricular tachycardia, unspecified: Principal | ICD-10-CM | POA: Diagnosis present

## 2023-04-06 DIAGNOSIS — I4892 Unspecified atrial flutter: Secondary | ICD-10-CM | POA: Diagnosis not present

## 2023-04-06 DIAGNOSIS — E039 Hypothyroidism, unspecified: Secondary | ICD-10-CM | POA: Diagnosis not present

## 2023-04-06 DIAGNOSIS — I5043 Acute on chronic combined systolic (congestive) and diastolic (congestive) heart failure: Secondary | ICD-10-CM | POA: Diagnosis not present

## 2023-04-06 DIAGNOSIS — I4891 Unspecified atrial fibrillation: Secondary | ICD-10-CM | POA: Diagnosis not present

## 2023-04-06 DIAGNOSIS — I480 Paroxysmal atrial fibrillation: Secondary | ICD-10-CM | POA: Diagnosis not present

## 2023-04-06 DIAGNOSIS — R918 Other nonspecific abnormal finding of lung field: Secondary | ICD-10-CM | POA: Diagnosis not present

## 2023-04-06 DIAGNOSIS — S21109A Unspecified open wound of unspecified front wall of thorax without penetration into thoracic cavity, initial encounter: Secondary | ICD-10-CM | POA: Diagnosis not present

## 2023-04-06 DIAGNOSIS — R0989 Other specified symptoms and signs involving the circulatory and respiratory systems: Secondary | ICD-10-CM | POA: Diagnosis not present

## 2023-04-06 DIAGNOSIS — R2681 Unsteadiness on feet: Secondary | ICD-10-CM | POA: Diagnosis not present

## 2023-04-06 DIAGNOSIS — Z79890 Hormone replacement therapy (postmenopausal): Secondary | ICD-10-CM

## 2023-04-06 DIAGNOSIS — M7989 Other specified soft tissue disorders: Secondary | ICD-10-CM | POA: Diagnosis not present

## 2023-04-06 DIAGNOSIS — N184 Chronic kidney disease, stage 4 (severe): Secondary | ICD-10-CM | POA: Diagnosis not present

## 2023-04-06 DIAGNOSIS — Z7401 Bed confinement status: Secondary | ICD-10-CM | POA: Diagnosis not present

## 2023-04-06 DIAGNOSIS — J9811 Atelectasis: Secondary | ICD-10-CM | POA: Diagnosis not present

## 2023-04-06 DIAGNOSIS — I5082 Biventricular heart failure: Secondary | ICD-10-CM | POA: Diagnosis present

## 2023-04-06 DIAGNOSIS — I509 Heart failure, unspecified: Secondary | ICD-10-CM | POA: Diagnosis not present

## 2023-04-06 DIAGNOSIS — I4811 Longstanding persistent atrial fibrillation: Secondary | ICD-10-CM | POA: Diagnosis not present

## 2023-04-06 DIAGNOSIS — E890 Postprocedural hypothyroidism: Secondary | ICD-10-CM | POA: Diagnosis not present

## 2023-04-06 DIAGNOSIS — R0602 Shortness of breath: Secondary | ICD-10-CM | POA: Diagnosis not present

## 2023-04-06 DIAGNOSIS — Z8249 Family history of ischemic heart disease and other diseases of the circulatory system: Secondary | ICD-10-CM

## 2023-04-06 DIAGNOSIS — E876 Hypokalemia: Secondary | ICD-10-CM | POA: Diagnosis not present

## 2023-04-06 DIAGNOSIS — B965 Pseudomonas (aeruginosa) (mallei) (pseudomallei) as the cause of diseases classified elsewhere: Secondary | ICD-10-CM | POA: Diagnosis not present

## 2023-04-06 DIAGNOSIS — Y838 Other surgical procedures as the cause of abnormal reaction of the patient, or of later complication, without mention of misadventure at the time of the procedure: Secondary | ICD-10-CM | POA: Diagnosis present

## 2023-04-06 DIAGNOSIS — Z7901 Long term (current) use of anticoagulants: Secondary | ICD-10-CM | POA: Diagnosis not present

## 2023-04-06 DIAGNOSIS — L7632 Postprocedural hematoma of skin and subcutaneous tissue following other procedure: Secondary | ICD-10-CM | POA: Diagnosis present

## 2023-04-06 DIAGNOSIS — E875 Hyperkalemia: Secondary | ICD-10-CM | POA: Diagnosis not present

## 2023-04-06 DIAGNOSIS — Z954 Presence of other heart-valve replacement: Secondary | ICD-10-CM | POA: Diagnosis not present

## 2023-04-06 DIAGNOSIS — K5900 Constipation, unspecified: Secondary | ICD-10-CM | POA: Diagnosis not present

## 2023-04-06 DIAGNOSIS — R079 Chest pain, unspecified: Secondary | ICD-10-CM | POA: Diagnosis not present

## 2023-04-06 DIAGNOSIS — N179 Acute kidney failure, unspecified: Secondary | ICD-10-CM | POA: Diagnosis present

## 2023-04-06 DIAGNOSIS — Z974 Presence of external hearing-aid: Secondary | ICD-10-CM

## 2023-04-06 DIAGNOSIS — N1832 Chronic kidney disease, stage 3b: Secondary | ICD-10-CM | POA: Diagnosis not present

## 2023-04-06 DIAGNOSIS — I428 Other cardiomyopathies: Secondary | ICD-10-CM | POA: Diagnosis not present

## 2023-04-06 DIAGNOSIS — I5022 Chronic systolic (congestive) heart failure: Secondary | ICD-10-CM

## 2023-04-06 DIAGNOSIS — I97648 Postprocedural seroma of a circulatory system organ or structure following other circulatory system procedure: Secondary | ICD-10-CM | POA: Diagnosis not present

## 2023-04-06 DIAGNOSIS — I255 Ischemic cardiomyopathy: Secondary | ICD-10-CM | POA: Diagnosis not present

## 2023-04-06 DIAGNOSIS — G4709 Other insomnia: Secondary | ICD-10-CM | POA: Diagnosis not present

## 2023-04-06 DIAGNOSIS — Z95810 Presence of automatic (implantable) cardiac defibrillator: Secondary | ICD-10-CM | POA: Diagnosis not present

## 2023-04-06 DIAGNOSIS — D649 Anemia, unspecified: Secondary | ICD-10-CM

## 2023-04-06 DIAGNOSIS — T827XXA Infection and inflammatory reaction due to other cardiac and vascular devices, implants and grafts, initial encounter: Secondary | ICD-10-CM | POA: Diagnosis not present

## 2023-04-06 DIAGNOSIS — Z452 Encounter for adjustment and management of vascular access device: Secondary | ICD-10-CM | POA: Diagnosis not present

## 2023-04-06 DIAGNOSIS — E871 Hypo-osmolality and hyponatremia: Secondary | ICD-10-CM | POA: Diagnosis not present

## 2023-04-06 DIAGNOSIS — R946 Abnormal results of thyroid function studies: Secondary | ICD-10-CM

## 2023-04-06 DIAGNOSIS — R296 Repeated falls: Secondary | ICD-10-CM | POA: Diagnosis not present

## 2023-04-06 DIAGNOSIS — Z4509 Encounter for adjustment and management of other cardiac device: Secondary | ICD-10-CM | POA: Diagnosis not present

## 2023-04-06 DIAGNOSIS — Z01818 Encounter for other preprocedural examination: Secondary | ICD-10-CM

## 2023-04-06 DIAGNOSIS — Z95818 Presence of other cardiac implants and grafts: Secondary | ICD-10-CM

## 2023-04-06 DIAGNOSIS — T8189XA Other complications of procedures, not elsewhere classified, initial encounter: Secondary | ICD-10-CM | POA: Diagnosis not present

## 2023-04-06 DIAGNOSIS — S21109D Unspecified open wound of unspecified front wall of thorax without penetration into thoracic cavity, subsequent encounter: Secondary | ICD-10-CM | POA: Diagnosis not present

## 2023-04-06 HISTORY — DX: Cardiac arrhythmia, unspecified: I49.9

## 2023-04-06 LAB — SEDIMENTATION RATE: Sed Rate: 14 mm/h (ref 0–16)

## 2023-04-06 LAB — COMPREHENSIVE METABOLIC PANEL
ALT: 24 U/L (ref 0–44)
AST: 30 U/L (ref 15–41)
Albumin: 3.2 g/dL — ABNORMAL LOW (ref 3.5–5.0)
Alkaline Phosphatase: 78 U/L (ref 38–126)
Anion gap: 12 (ref 5–15)
BUN: 28 mg/dL — ABNORMAL HIGH (ref 8–23)
CO2: 20 mmol/L — ABNORMAL LOW (ref 22–32)
Calcium: 8.3 mg/dL — ABNORMAL LOW (ref 8.9–10.3)
Chloride: 106 mmol/L (ref 98–111)
Creatinine, Ser: 1.9 mg/dL — ABNORMAL HIGH (ref 0.61–1.24)
GFR, Estimated: 35 mL/min — ABNORMAL LOW (ref >60)
Glucose, Bld: 97 mg/dL (ref 70–99)
Potassium: 3.6 mmol/L (ref 3.5–5.1)
Sodium: 138 mmol/L (ref 135–145)
Total Bilirubin: 1.4 mg/dL — ABNORMAL HIGH (ref 0.3–1.2)
Total Protein: 5.9 g/dL — ABNORMAL LOW (ref 6.5–8.1)

## 2023-04-06 LAB — TROPONIN I (HIGH SENSITIVITY)
Troponin I (High Sensitivity): 64 ng/L — ABNORMAL HIGH (ref <18)
Troponin I (High Sensitivity): 62 ng/L — ABNORMAL HIGH (ref <18)

## 2023-04-06 LAB — T4, FREE: Free T4: 1.33 ng/dL — ABNORMAL HIGH (ref 0.61–1.12)

## 2023-04-06 LAB — TSH: TSH: 18.44 u[IU]/mL — ABNORMAL HIGH (ref 0.350–4.500)

## 2023-04-06 LAB — SURGICAL PCR SCREEN
MRSA, PCR: NEGATIVE
Staphylococcus aureus: NEGATIVE

## 2023-04-06 LAB — CBC WITH DIFFERENTIAL/PLATELET
Abs Immature Granulocytes: 0.14 10*3/uL — ABNORMAL HIGH (ref 0.00–0.07)
Basophils Absolute: 0.1 10*3/uL (ref 0.0–0.1)
Basophils Relative: 1 %
Eosinophils Absolute: 0.1 10*3/uL (ref 0.0–0.5)
Eosinophils Relative: 1 %
HCT: 31.2 % — ABNORMAL LOW (ref 39.0–52.0)
Hemoglobin: 9.4 g/dL — ABNORMAL LOW (ref 13.0–17.0)
Immature Granulocytes: 2 %
Lymphocytes Relative: 4 %
Lymphs Abs: 0.3 10*3/uL — ABNORMAL LOW (ref 0.7–4.0)
MCH: 32.1 pg (ref 26.0–34.0)
MCHC: 30.1 g/dL (ref 30.0–36.0)
MCV: 106.5 fL — ABNORMAL HIGH (ref 80.0–100.0)
Monocytes Absolute: 1.1 10*3/uL — ABNORMAL HIGH (ref 0.1–1.0)
Monocytes Relative: 13 %
Neutro Abs: 7 10*3/uL (ref 1.7–7.7)
Neutrophils Relative %: 79 %
Platelets: 146 10*3/uL — ABNORMAL LOW (ref 150–400)
RBC: 2.93 MIL/uL — ABNORMAL LOW (ref 4.22–5.81)
RDW: 21 % — ABNORMAL HIGH (ref 11.5–15.5)
WBC: 8.6 10*3/uL (ref 4.0–10.5)
nRBC: 0 % (ref 0.0–0.2)

## 2023-04-06 LAB — C-REACTIVE PROTEIN: CRP: 3.1 mg/dL — ABNORMAL HIGH (ref <1.0)

## 2023-04-06 LAB — PROTIME-INR
INR: 1.8 — ABNORMAL HIGH (ref 0.8–1.2)
Prothrombin Time: 21.2 s — ABNORMAL HIGH (ref 11.4–15.2)

## 2023-04-06 LAB — MAGNESIUM: Magnesium: 1.9 mg/dL (ref 1.7–2.4)

## 2023-04-06 LAB — BRAIN NATRIURETIC PEPTIDE: B Natriuretic Peptide: 1258.7 pg/mL — ABNORMAL HIGH (ref 0.0–100.0)

## 2023-04-06 SURGERY — APPLICATION, WOUND VAC
Anesthesia: Choice | Laterality: Left

## 2023-04-06 MED ORDER — HEPARIN (PORCINE) 25000 UT/250ML-% IV SOLN
1400.0000 [IU]/h | INTRAVENOUS | Status: DC
  Administered 2023-04-06: 1100 [IU]/h via INTRAVENOUS
  Administered 2023-04-07 – 2023-04-08 (×2): 1200 [IU]/h via INTRAVENOUS
  Administered 2023-04-09 – 2023-04-10 (×2): 1350 [IU]/h via INTRAVENOUS
  Administered 2023-04-11: 1300 [IU]/h via INTRAVENOUS
  Administered 2023-04-12: 1400 [IU]/h via INTRAVENOUS
  Filled 2023-04-06 (×7): qty 250

## 2023-04-06 MED ORDER — MUPIROCIN 2 % EX OINT
1.0000 | TOPICAL_OINTMENT | Freq: Every day | CUTANEOUS | Status: DC
  Administered 2023-04-06 – 2023-04-22 (×17): 1 via TOPICAL
  Filled 2023-04-06 (×5): qty 22

## 2023-04-06 MED ORDER — SODIUM CHLORIDE 0.9% FLUSH: 3.0000 mL | INTRAVENOUS | Status: DC | PRN

## 2023-04-06 MED ORDER — FUROSEMIDE 10 MG/ML IJ SOLN
80.0000 mg | Freq: Two times a day (BID) | INTRAMUSCULAR | Status: AC
  Administered 2023-04-06: 80 mg via INTRAVENOUS
  Filled 2023-04-06: qty 8

## 2023-04-06 MED ORDER — HYDROCODONE-ACETAMINOPHEN 5-325 MG PO TABS
1.0000 | ORAL_TABLET | Freq: Four times a day (QID) | ORAL | Status: DC | PRN
  Administered 2023-04-06 – 2023-04-18 (×8): 1 via ORAL
  Filled 2023-04-06 (×8): qty 1

## 2023-04-06 MED ORDER — AMIODARONE HCL IN DEXTROSE 360-4.14 MG/200ML-% IV SOLN
60.0000 mg/h | INTRAVENOUS | Status: DC
  Administered 2023-04-06 (×2): 60 mg/h via INTRAVENOUS
  Filled 2023-04-06 (×2): qty 200

## 2023-04-06 MED ORDER — DOXYCYCLINE HYCLATE 100 MG PO TABS
100.0000 mg | ORAL_TABLET | Freq: Two times a day (BID) | ORAL | Status: DC
  Administered 2023-04-06 – 2023-04-15 (×19): 100 mg via ORAL
  Filled 2023-04-06 (×19): qty 1

## 2023-04-06 MED ORDER — CHLORHEXIDINE GLUCONATE CLOTH 2 % EX PADS
6.0000 | MEDICATED_PAD | Freq: Every day | CUTANEOUS | Status: DC
  Administered 2023-04-07 – 2023-04-22 (×16): 6 via TOPICAL

## 2023-04-06 MED ORDER — FUROSEMIDE 10 MG/ML IJ SOLN
80.0000 mg | Freq: Once | INTRAMUSCULAR | Status: AC
  Administered 2023-04-06: 80 mg via INTRAVENOUS
  Filled 2023-04-06: qty 8

## 2023-04-06 MED ORDER — MELATONIN 5 MG PO TABS
5.0000 mg | ORAL_TABLET | Freq: Every day | ORAL | Status: DC
  Administered 2023-04-06 – 2023-04-22 (×17): 5 mg via ORAL
  Filled 2023-04-06 (×17): qty 1

## 2023-04-06 MED ORDER — QUINIDINE GLUCONATE ER 324 MG PO TBCR: 324.0000 mg | EXTENDED_RELEASE_TABLET | Freq: Two times a day (BID) | ORAL | Status: DC

## 2023-04-06 MED ORDER — LEVOTHYROXINE SODIUM 88 MCG PO TABS
88.0000 ug | ORAL_TABLET | Freq: Every day | ORAL | Status: DC
  Administered 2023-04-07 – 2023-04-22 (×16): 88 ug via ORAL
  Filled 2023-04-06 (×16): qty 1

## 2023-04-06 MED ORDER — SODIUM CHLORIDE 0.9 % IV SOLN: 250.0000 mL | INTRAVENOUS | Status: DC | PRN

## 2023-04-06 MED ORDER — ACETAMINOPHEN 325 MG PO TABS: 650.0000 mg | ORAL_TABLET | ORAL | Status: DC | PRN

## 2023-04-06 MED ORDER — AMIODARONE LOAD VIA INFUSION
150.0000 mg | Freq: Once | INTRAVENOUS | Status: AC
  Administered 2023-04-06: 150 mg via INTRAVENOUS
  Filled 2023-04-06: qty 83.34

## 2023-04-06 MED ORDER — AMIODARONE HCL IN DEXTROSE 360-4.14 MG/200ML-% IV SOLN
60.0000 mg/h | INTRAVENOUS | Status: DC
  Administered 2023-04-06: 30 mg/h via INTRAVENOUS
  Administered 2023-04-07 – 2023-04-14 (×26): 60 mg/h via INTRAVENOUS
  Filled 2023-04-06 (×29): qty 200

## 2023-04-06 MED ORDER — SODIUM CHLORIDE 0.9% FLUSH
3.0000 mL | Freq: Two times a day (BID) | INTRAVENOUS | Status: DC
  Administered 2023-04-06 – 2023-04-22 (×21): 3 mL via INTRAVENOUS

## 2023-04-06 MED ORDER — ONDANSETRON HCL 4 MG/2ML IJ SOLN
4.0000 mg | Freq: Four times a day (QID) | INTRAMUSCULAR | Status: DC | PRN
  Administered 2023-04-08 – 2023-04-12 (×2): 4 mg via INTRAVENOUS
  Filled 2023-04-06 (×2): qty 2

## 2023-04-06 MED ORDER — ZINC GLUCONATE 50 MG PO TABS: 50.0000 mg | ORAL_TABLET | Freq: Every day | ORAL | Status: DC

## 2023-04-06 MED ORDER — POTASSIUM CHLORIDE CRYS ER 20 MEQ PO TBCR
40.0000 meq | EXTENDED_RELEASE_TABLET | Freq: Once | ORAL | Status: AC
  Administered 2023-04-06: 40 meq via ORAL
  Filled 2023-04-06: qty 2

## 2023-04-06 MED ORDER — AMOXICILLIN-POT CLAVULANATE 875-125 MG PO TABS
1.0000 | ORAL_TABLET | Freq: Two times a day (BID) | ORAL | Status: DC
  Administered 2023-04-06 – 2023-04-10 (×9): 1 via ORAL
  Filled 2023-04-06 (×10): qty 1

## 2023-04-06 MED ORDER — MAGNESIUM SULFATE 2 GM/50ML IV SOLN
2.0000 g | Freq: Once | INTRAVENOUS | Status: AC
  Administered 2023-04-06: 2 g via INTRAVENOUS
  Filled 2023-04-06: qty 50

## 2023-04-06 MED ORDER — ACETAMINOPHEN 500 MG PO TABS
1000.0000 mg | ORAL_TABLET | Freq: Four times a day (QID) | ORAL | Status: DC | PRN
  Administered 2023-04-07 – 2023-04-20 (×8): 1000 mg via ORAL
  Filled 2023-04-06 (×8): qty 2

## 2023-04-06 MED ORDER — ADULT MULTIVITAMIN W/MINERALS CH
1.0000 | ORAL_TABLET | Freq: Every morning | ORAL | Status: DC
  Administered 2023-04-07 – 2023-04-22 (×16): 1 via ORAL
  Filled 2023-04-06 (×17): qty 1

## 2023-04-06 MED ORDER — NITROGLYCERIN 0.4 MG SL SUBL: 0.4000 mg | SUBLINGUAL_TABLET | SUBLINGUAL | Status: DC | PRN

## 2023-04-06 NOTE — Telephone Encounter (Signed)
Device alert for successful HV therapy and pace terminated episodes Event occurred 9/10 @ 21:26, duration 17sec, mean HR 136.  EGM shows AFL, ATP delivered x4 with no change followed by 35J of HV therapy converting flutter to NSR. 3 additional episode where ATP slowed the ventricular response with up to 5 bursts of ATP.  Noise on defib channel noted AF burden 17.4%, Warfarin per EPIC HF diagnostics currently abnormal Route to triage LA, CVRS  Patient is currently scheduled for wound vac change today around 130pm at hospital. May need to go in sooner for eval/admission.  Sending for review with Dr. Therisa Doyne. Tillery, PA-C in hospital today.  I contacted [employer] rep, Shari Prows to make him aware of events/shock/ATP/ and defib channel noise.  (Aflutter) (SEE MY CHART MESSAGE FROM PATIENT).

## 2023-04-06 NOTE — Consult Note (Signed)
WOC Nurse wound follow up: Discussed with [name].  Surgery has been cancelled and he will do the dressing change himself.  I have spoken with Austin [name], Diplomatic Services operational officer and ordered supplies needed.  Canister ([employer]WSON # [id number]), 2 medium black foam kits ([employer] # [id number]).  The device is in the room with the patient. No further WOC needs at this time.  Will not follow at this time.  Please re-consult if needed.  Austin Gip MSN, RN, FNP-BC CWON Wound, Ostomy, Continence Nurse Outpatient [facility] [phone] Pager [phone]

## 2023-04-06 NOTE — ED Notes (Signed)
Vascular surgeon at bedside.

## 2023-04-06 NOTE — Telephone Encounter (Signed)
I saw in ER today.

## 2023-04-06 NOTE — Telephone Encounter (Signed)
Patient called in to report he just received another shock this am while discussing with provider plan of action.  Unable to send a remote transmission this am.  Patient instructed to go to ER now to stabilize before procedure scheduled later this am.  A. Tillery PA-C aware.

## 2023-04-06 NOTE — ED Notes (Signed)
IV team at bedside 

## 2023-04-06 NOTE — ED Notes (Signed)
Interrogated pacemaker 

## 2023-04-06 NOTE — Consult Note (Signed)
WOC Nurse Consult Note: Reason for Consult: Left chest wall NPWT (VAC) dressing.  Machine has been malfunctioning for over 24 hours and no therapy in place.  Dressing remains.  Consulted to apply VAC to hospital unit.  I have reached out to vascular team and nurse for clarification as patient was scheduled for VAC change in OR today.  The OR notes indicate sterile field and magnet needed for VAC change.  I will await to hear from vascular team before proceeding.  NPWT VAC device is in the room and will be transported with patient to 2H.  The ordered canister was not delivered so will ask for it to be ordered once on 2H.  Wound type: Left chest wall, surgical  Pressure Injury POA: NA Measurement: old VAC dressing in place, not attached to device.  Wound bed:not assessed Drainage (amount, consistency, odor) blood, clotting noted in line, the likely cause of the [facility] alarm yesterday.  Periwound: covered in drape and tape.  Dressing procedure/placement/frequency: Awaiting clarification from vascular team on how to safely proceed.   Will follow.  Mike Gip MSN, RN, FNP-BC CWON Wound, Ostomy, Continence Nurse Outpatient [facility] [phone] Pager [phone]

## 2023-04-06 NOTE — Progress Notes (Addendum)
ANTICOAGULATION CONSULT NOTE - Initial Consult  Pharmacy Consult for heparin Indication: atrial fibrillation and mechanical mitral valve   No Known Allergies  Patient Measurements:   Heparin Dosing Weight: 94.3 kg   Vital Signs: Temp: 97.8 F (36.6 C) (09/11 0959) BP: 114/79 (09/11 1130) Pulse Rate: 107 (09/11 1138)  Labs: Recent Labs    04/04/23 0000 04/06/23 1030  HGB  --  9.4*  HCT  --  31.2*  PLT  --  146*  LABPROT  --  21.2*  INR 1.6* 1.8*    Estimated Creatinine Clearance: 42.4 mL/min (A) (by C-G formula based on SCr of 1.57 mg/dL (H)).   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   Conductive hearing loss    uses hearing aids   Dysrhythmia    A-fib   History of blood transfusion 03/24/2023   Hypothyroidism    Intraspinal abscess    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   Nonischemic cardiomyopathy (HCC)    Psychosexual dysfunction with inhibited sexual excitement    S/P mitral valve replacement    Syncope and collapse    Unspecified sleep apnea    last sleep study 11/07, uses cpap   Ventricular tachycardia (HCC)    VT (ventricular tachycardia) (HCC)     Medications:  Scheduled:   furosemide  80 mg Intravenous Once    Assessment: 82 yom presenting after ICD shocks - on enoxaparin and warfarin PTA (LD 9/10 PM). PTA regimen 2.5 mg daily except 5 mg MWF. Currently undergoing bridge given recent admission for Barostim implant complicated with L chest wall hematoma requiring I&D - still requiring wound VAC changes.  INR today is 1.8, Hgb 9.4, plt 146. Trop 64. Scr 1.9 (CrCl 35 mL/min). Oozing at   Discussed with  vascular - plan to hold warfarin and use heparin for now.   Goal of Therapy:  INR 3-3.5  - when warfarin not on hold Heparin level: 0.3-0.5 Monitor platelets by anticoagulation protocol: Yes   Plan:  Hold warfarin  Start heparin infusion at 1100 units/hr Order heparin level in 8 hours after start Monitor daily HL, CBC, and for s/sx of bleeding   Thank you for allowing pharmacy to participate in this patient's care,  Sherron Monday, PharmD, BCCCP Clinical Pharmacist  Phone: [phone] 04/06/2023 12:32 PM  Please check AMION for all [facility] Pharmacy phone numbers After 10:00 PM, call Main Pharmacy [phone]

## 2023-04-06 NOTE — ED Notes (Signed)
ED TO INPATIENT HANDOFF REPORT  ED Nurse Name and Phone #: [phone]  S Name/Age/Gender Austin [name] 82 y.o. male Room/Bed: 029C/029C  Code Status   Code Status: Full Code  Home/SNF/Other Home Patient oriented to: self, place, time, and situation Is this baseline? Yes   Triage Complete: Triage complete  Chief Complaint Heart failure (HCC) [I50.9]  Triage Note Pt states Medtronic defibrillator went off last night and this am; denies CP; endorses dizziness and sob; Barostim procedure done 3 weeks ago; slight bleeding at site; pt states he had a wound vac but was disconnected last night; pt states he is supposed to have another procedure this afternoon   Allergies No Known Allergies  Level of Care/Admitting Diagnosis ED Disposition     ED Disposition  Admit   Condition  --   Comment  Hospital Area: [facility] [[id number]]  Level of Care: Progressive [102]  Admit to Progressive based on following criteria: CARDIOVASCULAR & THORACIC of moderate stability with acute coronary syndrome symptoms/low risk myocardial infarction/hypertensive urgency/arrhythmias/heart failure potentially compromising stability and stable post cardiovascular intervention patients.  May admit patient to [facility] or [facility] if equivalent level of care is available:: No  Covid Evaluation: Asymptomatic - no recent exposure (last 10 days) testing not required  Diagnosis: Heart failure [facility]) [[id number]]  Admitting Physician: Marinus Maw [[id number]]  Attending Physician: Marinus Maw [[id number]]  Certification:: I certify this patient will need inpatient services for at least 2 midnights  Expected Medical Readiness: 04/11/2023          B Medical/Surgery History 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    cx by infection,  explantation11/12 & reimplant 1/13   CHF (congestive heart failure) (HCC)    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   Conductive hearing loss    uses hearing aids   Dysrhythmia    A-fib   History of blood transfusion 03/24/2023   Hypothyroidism    Intraspinal abscess    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   Nonischemic cardiomyopathy (HCC)    Psychosexual dysfunction with inhibited sexual excitement    S/P mitral valve replacement    Syncope and collapse    Unspecified sleep apnea    last sleep study 11/07, uses cpap   Ventricular tachycardia (HCC)    VT (ventricular tachycardia) (HCC)    Past Surgical History:  Procedure Laterality Date   BIV ICD GENERATOR CHANGEOUT N/A 05/04/2017   Procedure: BiV ICD Generator Changeout;  Surgeon: Marinus Maw, MD;  Location: [facility] INVASIVE CV LAB;  Service: Cardiovascular;  Laterality: N/A;   CARDIAC CATHETERIZATION  04/24/2002   CARDIAC VALVE REPLACEMENT     CARDIOVERSION  03/09/2012   Procedure: CARDIOVERSION;  Surgeon: Marinus Maw, MD;  Location: [facility] ENDOSCOPY;  Service: Cardiovascular;  Laterality: N/A;   CARDIOVERSION N/A 11/22/2013   Procedure: CARDIOVERSION;  Surgeon: Thurmon Fair, MD;  Location: [facility] ENDOSCOPY;  Service: Cardiovascular;  Laterality: N/A;   dental implants     ELECTROPHYSIOLOGIC STUDY N/A 09/15/2015   Procedure: V Tach Ablation;  Surgeon: Marinus Maw, MD;  Location: [facility] INVASIVE CV LAB;  Service: Cardiovascular;  Laterality: N/A;   Evacution of epidural lumbar epidural abscess  07/26/1997   INCISION AND DRAINAGE OF WOUND Left 03/25/2023   Procedure: Washout of Chest Wall  Hematoma;  Surgeon: Nada Libman, MD;  Location: [facility] OR;  Service: Vascular;  Laterality: Left;   INCISION AND DRAINAGE OF WOUND N/A 03/29/2023   Procedure: IRRIGATION AND DEBRIDEMENT  CHEST WALL;  Surgeon: Nada Libman, MD;  Location: [facility] OR;  Service: Vascular;  Laterality: N/A;   INSERT / REPLACE  / REMOVE PACEMAKER  11/23/2008   MITRAL VALVE REPLACEMENT     w #[id number] [facility]   PERMANENT PACEMAKER INSERTION N/A 08/05/2011   Procedure: PERMANENT PACEMAKER INSERTION;  Surgeon: Marinus Maw, MD;  Location: [facility] CATH LAB;  Service: Cardiovascular;  Laterality: N/A;   RIGHT HEART CATH N/A 02/26/2021   Procedure: RIGHT HEART CATH;  Surgeon: Laurey Morale, MD;  Location: [facility] INVASIVE CV LAB;  Service: Cardiovascular;  Laterality: N/A;   THYROIDECTOMY     TONSILLECTOMY     V-TACH ABLATION  07/30/2021   in CE   VALVE REPLACEMENT  07/26/1998     A IV Location/Drains/Wounds Patient Lines/Drains/Airways Status     Active Line/Drains/Airways     Name Placement date Placement time Site Days   Peripheral IV 04/06/23 20 G Right Antecubital 04/06/23  1038  Antecubital  less than 1   Negative Pressure Wound Therapy Chest Left 03/25/23  1342  --  12   Wound / Incision (Open or Dehisced) 03/23/23 Other (Comment) Hand Left;Posterior Blood-filled blister 03/23/23  2245  Hand  14            Intake/Output Last 24 hours No intake or output data in the 24 hours ending 04/06/23 1146  Labs/Imaging Results for orders placed or performed during the hospital encounter of 04/06/23 (from the past 48 hour(s))  CBC with Differential/Platelet     Status: Abnormal   Collection Time: 04/06/23 10:30 AM  Result Value Ref Range   WBC 8.6 4.0 - 10.5 K/uL   RBC 2.93 (L) 4.22 - 5.81 MIL/uL   Hemoglobin 9.4 (L) 13.0 - 17.0 g/dL   HCT 60.4 (L) 54.0 - 98.1 %   MCV 106.5 (H) 80.0 - 100.0 fL   MCH 32.1 26.0 - 34.0 pg   MCHC 30.1 30.0 - 36.0 g/dL   RDW 19.1 (H) 47.8 - 29.5 %   Platelets 146 (L) 150 - 400 K/uL   nRBC 0.0 0.0 - 0.2 %   Neutrophils Relative % 79 %   Neutro Abs 7.0 1.7 - 7.7 K/uL   Lymphocytes Relative 4 %   Lymphs Abs 0.3 (L) 0.7 - 4.0 K/uL   Monocytes Relative 13 %   Monocytes Absolute 1.1 (H) 0.1 - 1.0 K/uL   Eosinophils Relative 1 %   Eosinophils Absolute 0.1 0.0 - 0.5 K/uL   Basophils  Relative 1 %   Basophils Absolute 0.1 0.0 - 0.1 K/uL   Immature Granulocytes 2 %   Abs Immature Granulocytes 0.14 (H) 0.00 - 0.07 K/uL    Comment: Performed at [facility], [address], [place], [place] [address]  Protime-INR     Status: Abnormal   Collection Time: 04/06/23 10:30 AM  Result Value Ref Range   Prothrombin Time 21.2 (H) 11.4 - 15.2 seconds   INR 1.8 (H) 0.8 - 1.2    Comment: (NOTE) INR goal varies based on device and disease states. Performed at [facility], [address], [place], [place] [address]    *Note: Due to a large number of results and/or encounters for the requested time period, some results have not been displayed.  A complete set of results can be found in Results Review.   DG Chest Portable 1 View  Result Date: 04/06/2023 CLINICAL DATA:  Shortness of breath. EXAM: PORTABLE CHEST 1 VIEW COMPARISON:  March 23, 2023. FINDINGS: Stable cardiomediastinal silhouette. Right-sided defibrillator is again noted. Mild central pulmonary vascular congestion is noted with possible bibasilar atelectasis or edema. Degenerative changes seen involving both shoulder joints. Status post mitral valve repair. IMPRESSION: Mild central pulmonary vascular congestion is noted with possible bibasilar atelectasis or edema. Electronically Signed   By: Lupita Raider M.D.   On: 04/06/2023 11:42    Pending Labs Unresulted Labs (From admission, onward)     Start     Ordered   04/06/23 1029  Culture, blood (single)  Once,   STAT        04/06/23 1028   04/06/23 1029  Sedimentation rate  Once,   URGENT        04/06/23 1028   04/06/23 1029  C-reactive protein  Once,   URGENT        04/06/23 1028   04/06/23 1026  Blood culture (routine x 2)  BLOOD CULTURE X 2,   R (with STAT occurrences)      04/06/23 1025   04/06/23 1026  Brain natriuretic peptide  Once,   URGENT        04/06/23 1025   04/06/23 1006  Comprehensive metabolic panel  ONCE - STAT,   STAT        04/06/23  1006   04/06/23 1006  TSH  ONCE - URGENT,   URGENT        04/06/23 1006   04/06/23 1006  T4, free  ONCE - URGENT,   URGENT        04/06/23 1006   04/06/23 1006  Magnesium  ONCE - STAT,   STAT        04/06/23 1006   Signed and Held  Basic metabolic panel  Daily,   R     Comments: As Scheduled for 5 days    Signed and Held   Signed and Held  Magnesium  Daily,   R      Signed and Held            Vitals/Pain Today's Vitals   04/06/23 1102 04/06/23 1115 04/06/23 1130 04/06/23 1138  BP:  116/87 114/79   Pulse: (!) 114 (!) 116 (!) 126 (!) 107  Resp: 18 (!) 27 (!) 24 (!) 25  Temp:      SpO2: 100% 100% 99% 100%  PainSc:        Isolation Precautions No active isolations  Medications Medications  amiodarone (NEXTERONE) 1.8 mg/mL load via infusion 150 mg (150 mg Intravenous Bolus from Bag 04/06/23 1051)    Followed by  amiodarone (NEXTERONE PREMIX) 360-4.14 MG/200ML-% (1.8 mg/mL) IV infusion (60 mg/hr Intravenous New Bag/Given 04/06/23 1051)    Followed by  amiodarone (NEXTERONE PREMIX) 360-4.14 MG/200ML-% (1.8 mg/mL) IV infusion (has no administration in time range)  furosemide (LASIX) injection 80 mg (has no administration in time range)    Mobility walks with person assist     Focused Assessments    R Recommendations: See Admitting Provider Note  Report given to:   Additional Notes:

## 2023-04-06 NOTE — ED Provider Notes (Signed)
[place] EMERGENCY DEPARTMENT AT [facility] Provider Note   CSN: [id number] Arrival date & time: 04/06/23  2841     History  No chief complaint on file.   Austin [name] is a 82 y.o. male.  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  Says that he is felt off the past few days and weak.  Has been having palpitations and near syncopal episodes.  Was defibrillated twice in the past 24 hours once at 9 PM last night and again at 9 AM today.  No chest pain.  Has had some mild shortness of breath and weight gain recently.  No fevers.  Did have a Barostim placed in his left chest recently as well.  Says that he missed several days of his amiodarone recently.       Home Medications Prior to Admission medications   Medication Sig Start Date End Date Taking? Authorizing Provider  acetaminophen (TYLENOL) 500 MG tablet Take 1,000 mg by mouth every 8 (eight) hours as needed for headache, fever or mild pain.    [provider]  amiodarone (PACERONE) 200 MG tablet Take 1 tablet (200 mg total) by mouth daily. Do NOT take Amiodarone on Wednesday or Sunday. 03/18/23   Laurey Morale, MD  amoxicillin (AMOXIL) 500 MG capsule TAKE 4 CAPSULES BY MOUTH 30 MINUTES PRIOR TO DENTAL WORK 02/19/21   Marinus Maw, MD  amoxicillin-clavulanate (AUGMENTIN) 875-125 MG tablet Take 1 tablet by mouth every 12 (twelve) hours for 5 days. 04/01/23 04/06/23  Noralee Stain, DO  doxycycline (VIBRA-TABS) 100 MG tablet Take 1 tablet (100 mg total) by mouth every 12 (twelve) hours for 5 days. 04/01/23 04/06/23  Noralee Stain, DO  enoxaparin (LOVENOX) 100 MG/ML injection Inject 1 mL (100 mg total)  into the skin every 12 (twelve) hours for 5 days. 04/01/23 04/06/23  Noralee Stain, DO  furosemide (LASIX) 20 MG tablet Take 3 tablets (60 mg total) by mouth 2 (two) times daily. 04/05/23   Graciella Freer, PA-C  HYDROcodone-acetaminophen (NORCO) 5-325 MG tablet Take 1 tablet by mouth every 6 (six) hours as needed for moderate pain. 03/17/23   Schuh, McKenzi P, PA-C  levothyroxine (SYNTHROID) 88 MCG tablet TAKE 1 TABLET BY MOUTH EVERY DAY 03/23/23   [name], MD  melatonin 5 MG TABS Take 5 mg by mouth at bedtime.    [provider]  Multiple Vitamin (MULTIVITAMIN WITH MINERALS) TABS tablet Take 1 tablet by mouth in the morning.    [provider]  mupirocin ointment (BACTROBAN) 2 % Apply 1 Application topically daily. 03/01/23   McDonald, Rachelle Hora, DPM  nitroGLYCERIN (NITROSTAT) 0.4 MG SL tablet Place 1 tablet (0.4 mg total) under the tongue every 5 (five) minutes x 3 doses as needed for chest pain. 10/14/18   Abelino Derrick, PA-C  quiniDINE gluconate 324 MG CR tablet TAKE 1 TABLET BY MOUTH 2 TIMES DAILY. 01/25/23   Laurey Morale, MD  warfarin (COUMADIN) 5 MG tablet TAKE AS DIRECTED BY THE COUMADIN CLINIC Patient taking differently: Take 2.5-5 mg by mouth See admin instructions. Take 5 mg by mouth on Mon., Wed., and Friday in the evening, all the other days take 2.5 mg by mouth in the evening TAKE AS DIRECTED BY  THE COUMADIN CLINIC 09/06/22   Laurey Morale, MD  zinc gluconate 50 MG tablet Take 1 tablet (50 mg total) by mouth daily. 05/25/22   [name], MD      Allergies    Patient has no known allergies.    Review of Systems   Review of Systems  Physical Exam Updated Vital Signs BP 109/82   Pulse (!) 117   Temp 98.2 F (36.8 C) (Oral)   Resp (!) 23   Ht 6\' 1"  (1.854 m)   Wt 93.3 kg   SpO2 91%   BMI 27.14 kg/m  Physical Exam Vitals and nursing note reviewed.  Constitutional:      General: He is not in acute distress.    Appearance: He is  well-developed.  HENT:     Head: Normocephalic and atraumatic.     Right Ear: External ear normal.     Left Ear: External ear normal.     Nose: Nose normal.  Eyes:     Extraocular Movements: Extraocular movements intact.     Conjunctiva/sclera: Conjunctivae normal.     Pupils: Pupils are equal, round, and reactive to light.  Cardiovascular:     Rate and Rhythm: Regular rhythm. Tachycardia present.     Heart sounds: Normal heart sounds.     Comments: Implanted cardiac device in right chest wall.  Left chest wall with drain in place from barostim and swelling present Pulmonary:     Effort: Pulmonary effort is normal. No respiratory distress.  Musculoskeletal:     Cervical back: Normal range of motion and neck supple.     Right lower leg: Edema present.     Left lower leg: Edema present.  Skin:    General: Skin is warm and dry.  Neurological:     Mental Status: He is alert. Mental status is at baseline.  Psychiatric:        Mood and Affect: Mood normal.        Behavior: Behavior normal.     ED Results / Procedures / Treatments   Labs (all labs ordered are listed, but only abnormal results are displayed) Labs Reviewed  COMPREHENSIVE METABOLIC PANEL - Abnormal; Notable for the following components:      Result Value   CO2 20 (*)    BUN 28 (*)    Creatinine, Ser 1.90 (*)    Calcium 8.3 (*)    Total Protein 5.9 (*)    Albumin 3.2 (*)    Total Bilirubin 1.4 (*)    GFR, Estimated 35 (*)    All other components within normal limits  TSH - Abnormal; Notable for the following components:   TSH 18.440 (*)    All other components within normal limits  T4, FREE - Abnormal; Notable for the following components:   Free T4 1.33 (*)    All other components within normal limits  CBC WITH DIFFERENTIAL/PLATELET - Abnormal; Notable for the following components:   RBC 2.93 (*)    Hemoglobin 9.4 (*)    HCT 31.2 (*)    MCV 106.5 (*)    RDW 21.0 (*)    Platelets 146 (*)    Lymphs Abs  0.3 (*)    Monocytes Absolute 1.1 (*)    Abs Immature Granulocytes 0.14 (*)    All other components within normal limits  PROTIME-INR - Abnormal; Notable for the following components:   Prothrombin Time 21.2 (*)    INR 1.8 (*)    All other  components within normal limits  BRAIN NATRIURETIC PEPTIDE - Abnormal; Notable for the following components:   B Natriuretic Peptide 1,258.7 (*)    All other components within normal limits  C-REACTIVE PROTEIN - Abnormal; Notable for the following components:   CRP 3.1 (*)    All other components within normal limits  TROPONIN I (HIGH SENSITIVITY) - Abnormal; Notable for the following components:   Troponin I (High Sensitivity) 64 (*)    All other components within normal limits  TROPONIN I (HIGH SENSITIVITY) - Abnormal; Notable for the following components:   Troponin I (High Sensitivity) 62 (*)    All other components within normal limits  SURGICAL PCR SCREEN  CULTURE, BLOOD (ROUTINE X 2)  CULTURE, BLOOD (SINGLE)  MAGNESIUM  SEDIMENTATION RATE  HEPARIN LEVEL (UNFRACTIONATED)  BASIC METABOLIC PANEL  MAGNESIUM  HEPARIN LEVEL (UNFRACTIONATED)    EKG EKG Interpretation Date/Time:  Wednesday April 06 2023 10:16:22 EDT Ventricular Rate:  105 PR Interval:  138 QRS Duration:  193 QT Interval:  436 QTC Calculation: 577 R Axis:   106  Text Interpretation: Wide complex tachycardia Nonspecific intraventricular conduction delay Repol abnrm, severe global ischemia (LM/MVD)  Confirmed by Vonita Moss [phone]) on 04/06/2023 4:03:48 PM  Radiology DG Chest Portable 1 View  Result Date: 04/06/2023 CLINICAL DATA:  Shortness of breath. EXAM: PORTABLE CHEST 1 VIEW COMPARISON:  March 23, 2023. FINDINGS: Stable cardiomediastinal silhouette. Right-sided defibrillator is again noted. Mild central pulmonary vascular congestion is noted with possible bibasilar atelectasis or edema. Degenerative changes seen involving both shoulder joints. Status post  mitral valve repair. IMPRESSION: Mild central pulmonary vascular congestion is noted with possible bibasilar atelectasis or edema. Electronically Signed   By: Lupita Raider M.D.   On: 04/06/2023 11:42    Procedures Procedures    Ultrasound Guided Peripheral IV Indication: difficult to access - nursing staff unable to secure adequate peripheral IV access Location: R Antecubital Catheter Size: 20 G  Static views used to identify the target vein then usual prep with Chloraprep. IV placed on first attempt under dynamic US guidance. Dark red flash noted, and catheter advanced smoothly into the vein. NS saline flushed without resistance, witnessed swelling, or patient discomfort. IV secured with I-site and tegaderm. The patient tolerated the procedure well. Performed By: Vonita Moss MD   Medications Ordered in ED Medications  amiodarone (NEXTERONE) 1.8 mg/mL load via infusion 150 mg (150 mg Intravenous Bolus from Bag 04/06/23 1051)    Followed by  amiodarone (NEXTERONE PREMIX) 360-4.14 MG/200ML-% (1.8 mg/mL) IV infusion (60 mg/hr Intravenous New Bag/Given 04/06/23 1444)    Followed by  amiodarone (NEXTERONE PREMIX) 360-4.14 MG/200ML-% (1.8 mg/mL) IV infusion (30 mg/hr Intravenous Rate/Dose Change 04/06/23 1657)  multivitamin with minerals tablet 1 tablet (has no administration in time range)  acetaminophen (TYLENOL) tablet 1,000 mg (has no administration in time range)  nitroGLYCERIN (NITROSTAT) SL tablet 0.4 mg (has no administration in time range)  melatonin tablet 5 mg (has no administration in time range)  quiniDINE gluconate CR tablet 324 mg (has no administration in time range)  mupirocin ointment (BACTROBAN) 2 % 1 Application (1 Application Topical Given 04/06/23 1340)  HYDROcodone-acetaminophen (NORCO/VICODIN) 5-325 MG per tablet 1 tablet (has no administration in time range)  levothyroxine (SYNTHROID) tablet 88 mcg (has no administration in time range)  doxycycline (VIBRA-TABS)  tablet 100 mg (100 mg Oral Given 04/06/23 1339)  sodium chloride flush (NS) 0.9 % injection 3 mL (3 mLs Intravenous Given 04/06/23 1344)  sodium chloride flush (  NS) 0.9 % injection 3 mL (has no administration in time range)  0.9 %  sodium chloride infusion (has no administration in time range)  ondansetron (ZOFRAN) injection 4 mg (has no administration in time range)  amoxicillin-clavulanate (AUGMENTIN) 875-125 MG per tablet 1 tablet (has no administration in time range)  heparin ADULT infusion 100 units/mL (25000 units/258mL) (1,100 Units/hr Intravenous New Bag/Given 04/06/23 1707)  magnesium sulfate IVPB 2 g 50 mL (2 g Intravenous New Bag/Given 04/06/23 1700)  furosemide (LASIX) injection 80 mg (has no administration in time range)  furosemide (LASIX) injection 80 mg (80 mg Intravenous Given 04/06/23 1339)  potassium chloride SA (KLOR-CON M) CR tablet 40 mEq (40 mEq Oral Given 04/06/23 1701)    ED Course/ Medical Decision Making/ A&P Clinical Course as of 04/06/23 1732  Wed Apr 06, 2023  1040 PA Tillery from [place]. Dr Earle Gell from vascular surgery.  [RP]  1055 Called by [employer].  Reports the patient has had 292 episodes of NSVT and 175 atrial arrhythmias.  Did have 7 episodes of sustained V. tach that were intervened upon in the past 24 hours.  Was shocked twice and had overdrive pacing 5 times. [RP]  1132 EP to admit the patient [RP]    Clinical Course User Index [RP] Rondel Baton, MD                                 Medical Decision Making Amount and/or Complexity of Data Reviewed Labs: ordered. Radiology: ordered.  Risk Prescription drug management. Decision regarding hospitalization.   Austin [name] is a 82 y.o. male with comorbidities that complicate the patient evaluation including nonischemic cardiomyopathy, atrial fibrillation/flutter on Coumadin, ventricular tachycardia status post ICD ([employer]tr[employer]) and VT ablation, mitral valve endocarditis status post mitral valve  replacement, and complete heart block with implanted pacemaker who presents to the emergency department with defibrillator firing.    Initial Ddx:  Arrhythmia, V. tach, MI, surgical site infection  MDM/Course:  Patient presents to the emergency department after being shocked by his defibrillator twice.  On exam is in a wide-complex tachycardia but is not going at a rate that I suspect for V. tach.  Missed several doses of his amiodarone so we have loaded him here in the emergency department.  Also been sent off electrolyte abnormalities.  Was already evaluated by surgery who is concerned that his barostim site may be infected. They recommended blood cultures and will talk about antibiotics and taking them back to the OR. Upon re-evaluation patient remained stable.  Was called by [employer] who reported several episodes of V. tach that were defibrillated and had overdrive pacing.  Patient had lab work sent that showed that he had an AKI as well as markedly elevated BNP and abnormal thyroid function.  Was discussed with cardiology who has admitted the patient.  This patient presents to the ED for concern of complaints listed in HPI, this involves an extensive number of treatment options, and is a complaint that carries with it a high risk of complications and morbidity. Disposition including potential need for admission considered.   Dispo: Admit  Additional history obtained from family Records reviewed Outpatient Clinic Notes and DC Summary The following labs were independently interpreted: Chemistry and show AKI I independently reviewed the following imaging with scope of interpretation limited to determining acute life threatening conditions related to emergency care: Chest x-ray and agree with the radiologist interpretation with the  following exceptions: none I personally reviewed and interpreted cardiac monitoring:  Wide-complex tachycardia I personally reviewed and interpreted the pt's EKG: see  above for interpretation  I have reviewed the patients home medications and made adjustments as needed Consults: Cardiology Social Determinants of health:  Elderly  CRITICAL CARE Performed by: Rondel Baton   Total critical care time: 30 minutes  Critical care time was exclusive of separately billable procedures and treating other patients.  Critical care was necessary to treat or prevent imminent or life-threatening deterioration.  Critical care was time spent personally by me on the following activities: development of treatment plan with patient and/or surrogate as well as nursing, discussions with consultants, evaluation of patient's response to treatment, examination of patient, obtaining history from patient or surrogate, ordering and performing treatments and interventions, ordering and review of laboratory studies, ordering and review of radiographic studies, pulse oximetry and re-evaluation of patient's condition.  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

## 2023-04-06 NOTE — Telephone Encounter (Signed)
Discussed with A. Tillery, PA-C.   I suspect he can just present as planned and they can decide what to do about his site. He has AKI post op and then they held his lasix, now he's volume overloaded.   I don't want him to necessarily lose his OR spot if he gets tied up somewhere downstairs. But if he has worsening symptoms or more shocks he' should go to the ER sooner.

## 2023-04-06 NOTE — Progress Notes (Addendum)
Patient name: Jeriel Hardwell MRN: [id number] DOB: May 30, 1941 Sex: male    HISTORY OF PRESENT ILLNESS:   Austin [name] is a 82 y.o. male who is status post left-sided B implantarostim over a week ago.  He developed a seroma posterior to the pectoralis muscle.  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  He was sent home last week with a wound VAC over the incision in his left chest.  Yesterday, this lost suction and he began reaccumulating fluid in his chest.  He was scheduled for a wound VAC change today in the operating room however he came into the emergency department after his ICD had fired twice.  He was found to have V. tach on several interrogations.  He has been seen by cardiology who has recommended that we not proceed with surgery today.  CURRENT MEDICATIONS:    Current Facility-Administered Medications  Medication Dose Route Frequency Provider Last Rate Last Admin   0.9 %  sodium chloride infusion  250 mL Intravenous PRN Graciella Freer, PA-C       acetaminophen (TYLENOL) tablet 1,000 mg  1,000 mg Oral Q6H PRN Graciella Freer, PA-C       amiodarone (NEXTERONE PREMIX) 360-4.14 MG/200ML-% (1.8 mg/mL) IV infusion  30 mg/hr Intravenous Continuous Graciella Freer, PA-C 16.67 mL/hr at 04/06/23 2048 30 mg/hr at 04/06/23 2048   amoxicillin-clavulanate (AUGMENTIN) 875-125 MG per tablet 1 tablet  1 tablet Oral Q12H Allayne Butcher, PA-C   1 tablet at 04/06/23 2112   doxycycline (VIBRA-TABS) tablet 100 mg  100 mg Oral Q12H Graciella Freer, PA-C   100 mg at 04/06/23 2111   heparin ADULT infusion 100 units/mL (25000 units/249mL)  1,100 Units/hr Intravenous Continuous Kendal Hymen, RPH 11 mL/hr at 04/06/23 1707 1,100 Units/hr at 04/06/23 1707   HYDROcodone-acetaminophen (NORCO/VICODIN) 5-325 MG per tablet 1  tablet  1 tablet Oral Q6H PRN Graciella Freer, PA-C   1 tablet at 04/06/23 2118   [START ON 04/07/2023] levothyroxine (SYNTHROID) tablet 88 mcg  88 mcg Oral QAC breakfast Graciella Freer, PA-C       melatonin tablet 5 mg  5 mg Oral QHS Graciella Freer, PA-C   5 mg at 04/06/23 2111   [START ON 04/07/2023] multivitamin with minerals tablet 1 tablet  1 tablet Oral q AM Graciella Freer, PA-C       mupirocin ointment (BACTROBAN) 2 % 1 Application  1 Application Topical Daily Graciella Freer, PA-C   1 Application at 04/06/23 1340   nitroGLYCERIN (NITROSTAT) SL tablet 0.4 mg  0.4 mg Sublingual Q5 Min x 3 PRN Graciella Freer, PA-C       ondansetron [facility]) injection 4 mg  4 mg Intravenous Q6H PRN Graciella Freer, PA-C       quiniDINE gluconate CR tablet 324 mg  324 mg Oral BID Graciella Freer, PA-C       sodium chloride flush (NS) 0.9 % injection 3 mL  3 mL Intravenous Q12H Graciella Freer, PA-C   3 mL at 04/06/23 1344   sodium chloride flush (NS) 0.9 % injection 3 mL  3 mL Intravenous PRN Graciella Freer, PA-C        REVIEW OF SYSTEMS:   [X]  denotes positive finding, [ ]  denotes negative finding Cardiac  Comments:  Chest pain or chest pressure:  Shortness of breath upon exertion:    Short of breath when lying flat:    Irregular heart rhythm:    Constitutional    Fever or chills:      PHYSICAL EXAM:   Vitals:   04/06/23 1600 04/06/23 1700 04/06/23 1800 04/06/23 2045  BP: 115/82 114/78 97/84   Pulse: (!) 124 (!) 109 (!) 115   Resp: 17 (!) 21 (!) 34   Temp:    97.9 F (36.6 C)  TempSrc:    Oral  SpO2: 99% 95% 95%   Weight:      Height:        GENERAL: The patient is a well-nourished male, in no acute distress. The vital signs are documented above. CARDIOVASCULAR: There is a regular rate and rhythm. PULMONARY: Non-labored respirations His wound VAC has lost suction.  I placed Tegaderms over top of the  wound VAC which enabled me to get a seal.  This was then reconnected to the wound VAC.  Then he started draining fluid.  STUDIES:      MEDICAL ISSUES:   S/p Ba this has been complicated by seromarostim implant: Posterior to the pectoralis muscle.  He was scheduled for wound VAC change today however he had arrhythmias causing his ICD to fire.  Cardiology has recommended that we not proceed with surgery today.  I have gotten a good seal again over his wound VAC by placing Tegaderms.  It is now draining fluid.  The plan is to optimize his volume status and cardiac status.  He has elected to have his Barost removed.  I will do this onceim he has been cleared for surgeryBy cardiology.  Currently his INR is 1.8 so he will be placed back on heparin.  I will plan on removing the device once he is stabilized for surgery.  This will likely have to be next week.  He should remain off of Coumadin.  Charlena Cross, MD, FACS [facility] [phone] Pager [phone]

## 2023-04-06 NOTE — TOC Progression Note (Signed)
Transition of Care [facility]) - Progression Note    Patient Details  Name: Austin [name] MRN: [id number] Date of Birth: 1940-08-13  Transition of Care [facility]) CM/SW Contact  Reva Bores, LCSWA Phone Number: 04/06/2023, 2:41 PM  Clinical Narrative:  [facility] CSW met with the pt at bedside. Pt stated that he has a history with HH services, [facility] Pt is coming from Independent living facility.  Pt stated that he plans to return back to ILF, with his wife and pets. TOC will continue following.     Expected Discharge Plan: Home w Home Health Services Barriers to Discharge: Continued Medical Work up  Expected Discharge Plan and Services     Post Acute Care Choice: Home Health Living arrangements for the past 2 months: Independent Living Facility                                       Social Determinants of Health (SDOH) Interventions SDOH Screenings   Food Insecurity: No Food Insecurity (04/04/2023)  Housing: Low Risk  (03/23/2023)  Transportation Needs: No Transportation Needs (04/04/2023)  Utilities: Not At Risk (03/23/2023)  Alcohol Screen: Low Risk  (03/08/2023)  Depression (PHQ2-9): Low Risk  (03/08/2023)  Financial Resource Strain: Low Risk  (03/08/2023)  Physical Activity: Insufficiently Active (03/08/2023)  Social Connections: Moderately Integrated (03/08/2023)  Stress: No Stress Concern Present (03/08/2023)  Tobacco Use: Low Risk  (04/06/2023)  Health Literacy: Adequate Health Literacy (03/08/2023)    Readmission Risk Interventions     No data to display

## 2023-04-06 NOTE — ED Triage Notes (Addendum)
Pt states [employer] defibrillator went off last night and this am; denies CP; endorses dizziness and sob; Barostim procedure done 3 weeks ago; slight bleeding at site; pt states he had a wound vac but was disconnected last night; pt states he is supposed to have another procedure this afternoon

## 2023-04-06 NOTE — H&P (Addendum)
ELECTROPHYSIOLOGY H&P NOTE    Patient ID: Austin [name] MRN: [id number], DOB/AGE: [birth year] 82 y.o.  Admit date: 04/06/2023 Date of Consult: 04/06/2023  Primary Physician: [name], MD Primary Cardiologist: None  Electrophysiologist: Dr. Ladona Ridgel   Reason for admission: ICD shocks / CHF  Patient Profile: Austin [name] is a 82 y.o. male with a history of Mechanical MV s/p MV endocarditis, VT, NICM, CHB and PAF/AFL who is being seen today for the evaluation of ICD shocks at the request of Dr. Eloise Harman.  HPI:  Austin [name] is a 82 y.o. male with medical history as above.   Well known to EP team with complicated VT management.   Pt underwent Barostim device 03/17/23.   He presented to ED 8/24 with severe swelling and pain to his left chest surrounding his surgical site with large hematoma. Warfarin was held and pt started on heparin bridge given Mechanical valve.   Ultimately he underwent I&D of left chest wall x 2 with wound VAC placement.   He was discharged 9/6 OFF lasix and GDMT given hypotension.  Discharged on Doxy and lovenox for bridging his INR.   Called office 9/9 with complaints of swelling and SOB; Restarted on his lasix and a BMET ordered.   He was planned for Wound Vac and Barostim site assessment 9/11. Alert received for ICD shock with VT with CL ~ (~160 bpm). Appears ATP delivered for slow VT and accelerated into his FVT zone. Report with steady increase in optivol.   Patient was called and stated he has felt horrible and felt like he is having a very difficult time recovering from his surgery.   He reported an additional shock this am and was instructed to come to the ED.   Feeling very poorly. Has had SOB, lightheadedness, and "waves" of wobbliness over the past week. Denies fevers or chills. Has had a "couple of good days".  Has been taking his lovenox, has not today.    Labs       PLT  146* (09/11 1030) HGB  9.4* (09/11  1030) WBC 8.6 (09/11 1030)  .   Allergies, Medical, Surgical, Social, and Family Histories have been reviewed and are referenced here-in when relevant for medical decision making.      (Not in a hospital admission)   Inpatient Medications:   furosemide  80 mg Intravenous Once    Allergies: No Known Allergies  Family History  Problem Relation Age of Onset   Heart disease Mother    Heart failure Mother    Heart disease Father    Heart failure Father    Sleep apnea Neg Hx      Physical Exam: Vitals:   04/06/23 1044 04/06/23 1100 04/06/23 1102 04/06/23 1115  BP:  109/86  116/87  Pulse: (!) 117 (!) 115 (!) 114 (!) 116  Resp: (!) 21 (!) 31 18 (!) 27  Temp:      SpO2: 97% 100% 100% 100%    GEN- NAD, A&O x 3, Chronically ill appearing.  HEENT: Normocephalic, atraumatic. JVP >10 Lungs- Diminished throughout.  Heart- Regular rate and rhythm, Mechanical heart sounds.  GI- Soft, NT, ND.  Extremities-  Chronic venous stasis changes. 2-3 pitting edema into his thighs. Fluid in his arms as well.    Radiology/Studies: [place] EKG SITE RITE  Result Date: 03/24/2023 If Site Rite image not attached, placement could not be confirmed due to current cardiac rhythm.  CUP PACEART REMOTE DEVICE CHECK  Result Date: 03/23/2023 Scheduled remote reviewed. Normal device function.  PAF, Burden 0.2%, longest 3 min 6 sec, on warfarin per Epic. Next remote 91 days. [facility], [facility].  DG Chest Portable 1 View  Result Date: 03/23/2023 CLINICAL DATA:  Severe left-sided chest pain and swelling over the past 2 days. Recent left chest wall stimulator placement 6 days ago. Hypotension. EXAM: PORTABLE CHEST 1 VIEW COMPARISON:  Chest x-ray dated July 05, 2022. FINDINGS: New stimulator implant in the left chest wall with single lead terminating in the region of the left carotid artery. There is prominent asymmetric soft tissue density surrounding the stimulator in the left chest wall. Unchanged right chest wall  AICD. Stable cardiomegaly status post mitral valve replacement. Low lung volumes are present, causing crowding of the pulmonary vasculature. No focal consolidation, pleural effusion, or pneumothorax. No acute osseous abnormality. IMPRESSION: 1. New stimulator implant in the left chest wall with prominent asymmetric soft tissue density surrounding the stimulator in the left chest wall, concerning for hematoma. 2. No acute cardiopulmonary disease. Electronically Signed   By: Obie Dredge M.D.   On: 03/23/2023 10:28    EKG:on arrival shows V pacing in 80s with intermittently conducted beats vs PVCs - Noise from barostim.  (personally reviewed)  TELEMETRY: WCT 110-120s, suspect slow VT given his history and prior lack of AV conduction (personally reviewed)  DEVICE HISTORY:  MDT dual chamber ICD implanted 08/05/2011, gen change 05/04/2017 + hx of VT Nov 2021, VT 160's required external shock   AAD Amiodarone goes back to 2010  Assessment/Plan:  VT with ICD shock Likely in setting of A/C CHF, potentially in slow VT still.  Reload amiodarone IV, and consider trying to pace out after more diuresis and amio.  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  Give IV lasix 80 mg x 1 and have asked HF team to see.  GDMT have been on hold with hypotension.   Barostim implantation 8/22 Complicated by hematoma and poorly healing site that has continued to drain.   Atrial flutter Longstanding persistent Afib Left sided flutter Managed on coumadin for CHA2DS2VASc of at least 4.   Mechanical MVR Functioning well by prior Echo.  Consider repeat once stabilized.    For questions or updates, please contact [facility] Please consult [email or web address] for contact info under Cardiology/STEMI.  Dustin Flock, PA-C  04/06/2023 11:29 AM  EP Attending  Patient  seen and examined. He is well known to me. He has a long h/o VT and atrial fib/flutter and high grade heart block and worsening CHF resulting in an attempt at inserting a barostim device which has been complicated by hematoma which appears to be mostly serous fluid for which he has not healed. He has had 2 wound vacs but keeps losing the seal. He has been feeling more sob and weak and has had VT storm. He has a slow VT around 150/min and his atrial rhythm is also atrial flutter. He has been off of his diuretic. He was shocked last night and this morning after ATP accelerated the VT. He did not have syncope. On exam he is an ill appearing man, who is a bit anxious. Lungs reveal scattered rales and CV with a regular tachy and mechanical S1. He has 3+ edema bilaterally. Tele reviewed.   A/P VT storm - we have started IV amio  and once he is loaded we will plan to ATP manually.  Acute on chronic systolic heart failure - he will be treated with IV lasix. He is markedly volume overloaded. Atrial fib/flutter - this is not helping. He will continue the [facility] and we will try again to pace terminated the flutter.  Recurrent wound drainage from the baros stim - the patient and his daughter would like to have the barostim removed. I agree. We will plan for this next week. He has not had any infectious symptoms but will treat him for emperic infection. I am concerned about his left chest wound. I suspect that it will have to heal by secondary intention.   Sharlot Gowda Chere Babson,MD

## 2023-04-06 NOTE — Progress Notes (Signed)
Heart Failure Navigator Progress Note  Assessed for Heart & Vascular TOC clinic readiness.  Patient does not meet criteria due to been established patient of Dr. Shirlee Latch.   Navigator will sign off at this time.    Washington Whedbee,RN, BSN,MSN Heart Failure Nurse Navigator. Contact by secure chat only.

## 2023-04-06 NOTE — Telephone Encounter (Signed)
Received a call from patient's daughter Osborne Copus. Reports that patient's defibrillator fired twice last night and he is currently at [facility]- ED as directed by provider. She is unsure how this will affect his surgery scheduled on today for wound vac change. Truddie Crumble that [name] will be made aware and she will updated regarding surgery status from nurse/provider while in the ED.   Contacted [facility]- OR and informed Bjorn Loser H. Of the situation. She reports that the OR has been made aware as well and will notify [name].

## 2023-04-06 NOTE — ED Notes (Addendum)
Electrophysiology (Dr. Lanna Poche) at bedside

## 2023-04-06 NOTE — Consult Note (Cosign Needed Addendum)
Advanced Heart Failure Team Consult Note   Primary Physician: [name], MD PCP-Cardiologist: Dr. Shirlee Latch   Reason for Consultation: Acute on Chronic Systolic Heart Failure   HPI:    Austin [name] is seen today for evaluation of acute on chronic systolic heart failure at the request of Dr. Ladona Ridgel, Electrophysiology.   82 y.o. with history of mechanical mitral valve s/p MV endocarditis, VT, nonischemic cardiomyopathy, complete heart block was referred by Dr. Katrinka Blazing for evaluation of CHF.  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 He subsequently developed a presumed nonischemic cardiomyopathy.  This was complicated by VT, he had a VT ablation in 2017 and had a Medtronic CRT-D device implanted. This had to be removed due to infection in 11/12 and was reimplanted in 1/13. Echo in 5/22 showed EF 30% with normal RV, mechanical MV stable with mean gradient 3 mmHg.  He has been in atrial fibrillation persistently since late 2021.  He is on amiodarone and has been cardioverted in the past. His LV lead is not functional.  He has complete heart block and is persistently RV pacing.    Had RHC 02/26/21 RA 4, PA 32/12, PCWP 11, CO 4.6, CI 2.2. Normal filling pressures and slightly low cardiac output.    Digoxin was stopped due to elevated level.    He saw Dr. Ladona Ridgel in 9/22 and was in slow VT in the office.  He was paced out of VT and amiodarone was increased to 200 mg daily.    He saw Dr. Ladona Ridgel, and placement of a left bundle or LV lead was decided to be too risky a procedure.  He was referred to Dr. Marquette Saa with the hope that he could have a VT ablation and get off amiodarone, which is likely playing a role in his symptoms.      S/p VT ablation with Dr. Marquette Saa 1/23.   Echo in 4/23 showed EF 20-25% with diffuse hypokinesis and septal-lateral dyssynchrony, mildly decreased RV systolic function, mechanical MV with normal function, dilated IVC.     He was seen in the office in 7/23, noted to be in atrial flutter in the atria and slow VT in the ventricles.  VT was pace terminated by Dr. Graciela Husbands and lower threshold set for ATP.  Later in 7/23, he had another VT episode and had an ICD discharge.  Quinidine was then started by Dr. Ladona Ridgel.    Follow up 10/23, had runs of VT terminated by ATP. Stable NYHA III and volume stable by OptiVol.   Echo 3/24 showed EF 20-25%, mildly decreased RV systolic function, moderate TR, IVC dilated, stable mechanical mitral valve with mean gradient 4 mmHg.    Follow up with EP 12/07/22, EKG showed atrial tach w/ ventricular pacing. Attempts were made to pace out but could not. Dr. Ladona Ridgel increased his ATP and turned off the VT therapy in the slow VT zone. Continued on amiodarone and quinidine.   03/17/23 underwent Barostim implant. Post implant course c/b left chest wall hematoma, in the setting of coumadin therapy needed for mechanical mitral valve.  Was readmitted on 8/28. Required I&D for evacuation of hematoma and placement of wound vac + placement of antibiotic beads on 8/30. Discharged home on 9/6. Per d/c summary, he was instructed to hold metoprolol, valsartan, spironolactone and lasix due to hypotension.   Now presents back to the ED w/ ICD shocks x 2. Once last PM and again this AM. Device interrogation  showed appropriate shock for VT. Also in a/c CHF w/ volume overload. AHF team asked to assist w/ HF management.   Initial lab, WBC 8.6, Hgb 9.4. CMP and BNP pending. INR 1.8.  CXR w/ mild central pulmonary vascular congestion w/ bibasilar edema.   He reports ~15 lb wt gain over the last 2 wks. Symptomatic w/ NYHA Class IIIb symptoms + bendopnea. Denies CP.   On amio gtt. He remains in slow VT, 80s. BP 111/80. A&Ox3. Mentating ok. Jovial. No current resting dyspnea. Daughter present at bedside.    Review of Systems: [y] = yes, [ ]  = no   General: Weight gain [Y ]; Weight loss [ ] ; Anorexia [ ] ; Fatigue [  ]; Fever [ ] ; Chills [ ] ; Weakness [Y ]  Cardiac: Chest pain/pressure [ ] ; Resting SOB [ Y]; Exertional SOB [Y ]; Orthopnea [ ] ; Pedal Edema [ Y]; Palpitations [ ] ; Syncope [ ] ; Presyncope [ ] ; Paroxysmal nocturnal dyspnea[ ]   Pulmonary: Cough [ ] ; Wheezing[ ] ; Hemoptysis[ ] ; Sputum [ ] ; Snoring [ ]   GI: Vomiting[ ] ; Dysphagia[ ] ; Melena[ ] ; Hematochezia [ ] ; Heartburn[ ] ; Abdominal pain [ ] ; Constipation [ ] ; Diarrhea [ ] ; BRBPR [ ]   GU: Hematuria[ ] ; Dysuria [ ] ; Nocturia[ ]   Vascular: Pain in legs with walking [ ] ; Pain in feet with lying flat [ ] ; Non-healing sores [ ] ; Stroke [ ] ; TIA [ ] ; Slurred speech [ ] ;  Neuro: Headaches[ ] ; Vertigo[ ] ; Seizures[ ] ; Paresthesias[ ] ;Blurred vision [ ] ; Diplopia [ ] ; Vision changes [ ]   Ortho/Skin: Arthritis [ ] ; Joint pain [ ] ; Muscle pain [ ] ; Joint swelling [ ] ; Back Pain [ ] ; Rash [ ]   Psych: Depression[ ] ; Anxiety[ ]   Heme: Bleeding problems [ ] ; Clotting disorders [ ] ; Anemia [ ]   Endocrine: Diabetes [ ] ; Thyroid dysfunction[ ]   Home Medications Prior to Admission medications   Medication Sig Start Date End Date Taking? Authorizing Provider  acetaminophen (TYLENOL) 500 MG tablet Take 1,000 mg by mouth every 6 (six) hours as needed for mild pain.    [provider]  amiodarone (PACERONE) 200 MG tablet Take 1 tablet (200 mg total) by mouth daily. Do NOT take Amiodarone on Wednesday or Sunday. 03/18/23   Laurey Morale, MD  amoxicillin (AMOXIL) 500 MG capsule TAKE 4 CAPSULES BY MOUTH 30 MINUTES PRIOR TO DENTAL WORK 02/19/21   Marinus Maw, MD  amoxicillin-clavulanate (AUGMENTIN) 875-125 MG tablet Take 1 tablet by mouth every 12 (twelve) hours for 5 days. 04/01/23 04/06/23  Noralee Stain, DO  doxycycline (VIBRA-TABS) 100 MG tablet Take 1 tablet (100 mg total) by mouth every 12 (twelve) hours for 5 days. 04/01/23 04/06/23  Noralee Stain, DO  enoxaparin (LOVENOX) 100 MG/ML injection Inject 1 mL (100 mg total) into the skin every 12 (twelve) hours  for 5 days. 04/01/23 04/06/23  Noralee Stain, DO  furosemide (LASIX) 20 MG tablet Take 3 tablets (60 mg total) by mouth 2 (two) times daily. 04/05/23   Graciella Freer, PA-C  HYDROcodone-acetaminophen (NORCO) 5-325 MG tablet Take 1 tablet by mouth every 6 (six) hours as needed for moderate pain. 03/17/23   Schuh, McKenzi P, PA-C  levothyroxine (SYNTHROID) 88 MCG tablet TAKE 1 TABLET BY MOUTH EVERY DAY 03/23/23   [name], MD  melatonin 5 MG TABS Take 5 mg by mouth at bedtime.    [provider]  Multiple Vitamin (MULTIVITAMIN WITH MINERALS) TABS tablet Take 1 tablet by mouth  in the morning.    [provider]  mupirocin ointment (BACTROBAN) 2 % Apply 1 Application topically daily. 03/01/23   McDonald, Rachelle Hora, DPM  nitroGLYCERIN (NITROSTAT) 0.4 MG SL tablet Place 1 tablet (0.4 mg total) under the tongue every 5 (five) minutes x 3 doses as needed for chest pain. 10/14/18   Abelino Derrick, PA-C  quiniDINE gluconate 324 MG CR tablet TAKE 1 TABLET BY MOUTH 2 TIMES DAILY. 01/25/23   Laurey Morale, MD  warfarin (COUMADIN) 5 MG tablet TAKE AS DIRECTED BY THE COUMADIN CLINIC Patient taking differently: Take 2.5-5 mg by mouth See admin instructions. Take 5 mg by mouth on Mon., Wed., and Friday in the evening, all the other days take 2.5 mg by mouth in the evening TAKE AS DIRECTED BY THE COUMADIN CLINIC 09/06/22   Laurey Morale, MD  zinc gluconate 50 MG tablet Take 1 tablet (50 mg total) by mouth daily. 05/25/22   [name], MD    Past Medical History: Past Medical History:  Diagnosis Date   AICD (automatic cardioverter/defibrillator) present    Atrial fibrillation [facility])    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    cx by infection, explantation11/12 & reimplant 1/13   CHF (congestive heart failure) (HCC)    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   Conductive hearing loss    uses hearing aids   Dysrhythmia    A-fib   History of blood transfusion  03/24/2023   Hypothyroidism    Intraspinal abscess    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   Nonischemic cardiomyopathy (HCC)    Psychosexual dysfunction with inhibited sexual excitement    S/P mitral valve replacement    Syncope and collapse    Unspecified sleep apnea    last sleep study 11/07, uses cpap   Ventricular tachycardia (HCC)    VT (ventricular tachycardia) (HCC)     Past Surgical History: Past Surgical History:  Procedure Laterality Date   BIV ICD GENERATOR CHANGEOUT N/A 05/04/2017   Procedure: BiV ICD Generator Changeout;  Surgeon: Marinus Maw, MD;  Location: [facility]VASIVE CV LAB;  Service: Cardiovascular;  Laterality: N/A;   CARDIAC CATHETERIZATION  04/24/2002   CARDIAC VALVE REPLACEMENT     CARDIOVERSION  03/09/2012   Procedure: CARDIOVERSION;  Surgeon: Marinus Maw, MD;  Location: [facility] ENDOSCOPY;  Service: Cardiovascular;  Laterality: N/A;   CARDIOVERSION N/A 11/22/2013   Procedure: CARDIOVERSION;  Surgeon: Thurmon Fair, MD;  Location: [facility] ENDOSCOPY;  Service: Cardiovascular;  Laterality: N/A;   dental implants     ELECTROPHYSIOLOGIC STUDY N/A 09/15/2015   Procedure: V Tach Ablation;  Surgeon: Marinus Maw, MD;  Location: [facility] INVASIVE CV LAB;  Service: Cardiovascular;  Laterality: N/A;   Evacution of epidural lumbar epidural abscess  07/26/1997   INCISION AND DRAINAGE OF WOUND Left 03/25/2023   Procedure: Washout of Chest Wall Hematoma;  Surgeon: Nada Libman, MD;  Location: [facility] OR;  Service: Vascular;  Laterality: Left;   INCISION AND DRAINAGE OF WOUND N/A 03/29/2023   Procedure: IRRIGATION AND DEBRIDEMENT  CHEST WALL;  Surgeon: Nada Libman, MD;  Location: [facility] OR;  Service: Vascular;  Laterality: N/A;   INSERT / REPLACE / REMOVE PACEMAKER  11/23/2008   MITRAL VALVE REPLACEMENT     w #[id number] [facility]   PERMANENT PACEMAKER INSERTION N/A 08/05/2011   Procedure: PERMANENT PACEMAKER INSERTION;  Surgeon: Marinus Maw, MD;  Location: [facility] CATH LAB;  Service: Cardiovascular;  Laterality: N/A;   RIGHT HEART CATH N/A 02/26/2021   Procedure: RIGHT HEART CATH;  Surgeon: Laurey Morale, MD;  Location: [facility] INVASIVE CV LAB;  Service: Cardiovascular;  Laterality: N/A;   THYROIDECTOMY     TONSILLECTOMY     V-TACH ABLATION  07/30/2021   in [place]   VALVE REPLACEMENT  07/26/1998    Family History: Family History  Problem Relation Age of Onset   Heart disease Mother    Heart failure Mother    Heart disease Father    Heart failure Father    Sleep apnea Neg Hx     Social History: Social History   Socioeconomic History   Marital status: Married    Spouse name: Not on file   Number of children: 1   Years of education: 18   Highest education level: Master's degree (e.g., MA, MS, MEng, MEd, MSW, MBA)  Occupational History   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  Has one daughter.  Retired.  Education: Masters.   Social Determinants of Health   Financial Resource Strain: Low Risk  (03/08/2023)   Overall Financial Resource Strain (CARDIA)    Difficulty of Paying Living Expenses: Not hard at all  Food Insecurity: No Food Insecurity (04/04/2023)   Hunger Vital Sign    Worried About Running Out of Food in the Last Year: Never true    Ran Out of Food in the Last Year: Never true  Transportation Needs: No Transportation Needs (04/04/2023)   PRAPARE - Administrator, Civil Service (Medical): No    Lack of Transportation (Non-Medical): No  Physical Activity: Insufficiently Active (03/08/2023)   Exercise Vital Sign    Days of Exercise per Week: 4 days     Minutes of Exercise per Session: 30 min  Stress: No Stress Concern Present (03/08/2023)   [employer] of Occupational Health - Occupational Stress Questionnaire    Feeling of Stress : Not at all  Social Connections: Moderately Integrated (03/08/2023)   Social Connection and Isolation Panel [NHANES]    Frequency of Communication with Friends and Family: More than three times a week    Frequency of Social Gatherings with Friends and Family: Twice a week    Attends Religious Services: More than 4 times per year    Active Member of [employer] or Organizations: No    Attends Engineer, structural: Never    Marital Status: Married    Allergies:  No Known Allergies  Objective:    Vital Signs:   Temp:  [97.8 F (36.6 C)] 97.8 F (36.6 C) (09/11 0959) Pulse Rate:  [64-117] 114 (09/11 1102) Resp:  [18-31] 18 (09/11 1102) BP: (109-122)/(79-86) 109/86 (09/11 1100) SpO2:  [97 %-100 %] 100 % (09/11 1102)    Weight change: There were no vitals filed for this visit.  Intake/Output:  No intake or output data in the 24 hours ending 04/06/23 1127    Physical Exam    General:  elderly, chronically ill appearing. NAD HEENT: normal Neck: supple. JVP 10  cm. Carotids 2+ bilat; no bruits. No lymphadenopathy or thyromegaly appreciated. Cor: PMI nondisplaced. Regular rate & rhythm. + mechanical valve sounds. +left upper chest wound vac Lungs: decreased BS at the bases  Abdomen: soft, nontender, nondistended. No hepatosplenomegaly. No bruits or masses. Good bowel sounds. Extremities: no cyanosis, clubbing, rash, 2+ b.l LE edema, chronic venous stasis dermatitis  Neuro: alert & orientedx3, cranial nerves grossly intact. moves all 4 extremities w/o difficulty. Affect pleasant   Telemetry   Slow VT 80s   EKG    V paced 86 bpm, + PVCs, + artifact from barostim (personally reviewed)  Labs   Basic Metabolic Panel: Recent Labs  Lab 03/31/23 0434 04/01/23 0705  NA 135 134*  K 4.4  4.4  CL 104 101  CO2 24 24  GLUCOSE 99 92  BUN 25* 23  CREATININE 1.65* 1.57*  CALCIUM 8.7* 8.9    Liver Function Tests: No results for input(s): "AST", "ALT", "ALKPHOS", "BILITOT", "PROT", "ALBUMIN" in the last 168 hours. No results for input(s): "LIPASE", "AMYLASE" in the last 168 hours. No results for input(s): "AMMONIA" in the last 168 hours.  CBC: Recent Labs  Lab 03/31/23 0434 04/01/23 0705 04/06/23 1030  WBC 8.6 9.5 8.6  NEUTROABS  --   --  7.0  HGB 7.8* 8.0* 9.4*  HCT 25.1* 26.8* 31.2*  MCV 98.0 101.1* 106.5*  PLT 148* 159 146*    Cardiac Enzymes: No results for input(s): "CKTOTAL", "CKMB", "CKMBINDEX", "TROPONINI" in the last 168 hours.  BNP: BNP (last 3 results) Recent Labs    12/08/22 1612 02/03/23 1502 03/23/23 1045  BNP 723.4* 453.3* 352.5*    ProBNP (last 3 results) Recent Labs    12/16/22 0959  PROBNP 3,692*     CBG: No results for input(s): "GLUCAP" in the last 168 hours.  Coagulation Studies: Recent Labs    04/04/23 0000  INR 1.6*     Imaging   No results found.   Medications:     Current Medications:  furosemide  80 mg Intravenous Once    Infusions:  amiodarone 60 mg/hr (04/06/23 1051)   Followed by   amiodarone        Patient Profile   82 y/o male w/ h/o MV endocarditis s/p MV replacement on chronic coumadin, chronic systolic heart failure due to nonischemic cardiomyopathy, VT w/ failed ablations, complete heart block, Afib/flutter, s/p CRT-D, CKD IIIb and recent barostim implant 8/24 c/b surgical site chest wall hematoma, s/p I&D w/ placement of wound vac.   Now readmitted for ICD shocks for VT and a/c CHF.    Assessment/Plan   1. VT/ ICD Shocks - VT ablation in 2017. Redo VT ablation in 1/23 (Dr. Marquette Saa).  - had slow VT 7/23 and was pace-terminated in the office, then he had an ICD shock for VT later in 7/23  - on amio + quinidine  - now admitted for ICD shocks x 2 for VT  - remains in slow VT 80s.  EP managing. Reloading w/ IV amio - continue quinidine 324 mg BID  - K and Mg labs pending    2. Acute on Chronic Systolic Heart Failure: Long-standing, suspected nonischemic cardiomyopathy.  Had RHC on 02/26/21 with normal filling pressures and mildly low cardiac output.  Echo in 4/23 showed  EF 20-25% with diffuse hypokinesis and septal-lateral dyssynchrony, mildly decreased RV systolic function, mechanical MV with normal function, dilated IVC.  He has a Medtronic CRT-D device present, but the LV lead is not functional.  He has complete heart block, so has been RV paced chronically with a wide QRS. Recently admitted for post surgical complication (chest wall hematoma) and HF GDMT + home diuretics were held due to hypotension. Now w/ subsequent volume overload and NYHA Class IIIb symptoms. Wt up ~15 lb above b/l dry wt. SBP soft, 110s in ED.  - start diuresis w/ IV Lasix, plan 80 mg bid and follow response.  - monitor lytes w/ diuresis, keep K > 4.0 and Mg > 2.0 - GDMT limited by soft BP and b/l CKD  - place TED hoses   3. Surgical Site Hematoma - post barostim complication, in setting of coumadin for mechanical MV - s/p I&D for evacuation of hematoma and placement of wound vac + placement of antibiotic beads on 8/30 - wound vac management per [name]    4. Mechanical MV - on coumadin, INR goal 3.0-3.5  - INR 1.8, - A/c management per pharmD. Will discuss timing of wound vac change-out w/ [name]  5. CKD IIIb - b/l SCr 1.5-2.0 - sCr 1.9 on admit - monitor w/ diuresis    Length of Stay: 0  Austin Berendt, PA-C  04/06/2023, 11:27 AM  Advanced Heart Failure Team Pager [phone] (M-F; 7a - 5p)  Please contact [facility] for night-coverage after hours (4p -7a ) and weekends on [email or web address]

## 2023-04-07 ENCOUNTER — Inpatient Hospital Stay (HOSPITAL_COMMUNITY): Payer: Medicare Other

## 2023-04-07 DIAGNOSIS — I255 Ischemic cardiomyopathy: Secondary | ICD-10-CM | POA: Diagnosis not present

## 2023-04-07 DIAGNOSIS — I472 Ventricular tachycardia, unspecified: Secondary | ICD-10-CM | POA: Diagnosis not present

## 2023-04-07 DIAGNOSIS — I5023 Acute on chronic systolic (congestive) heart failure: Secondary | ICD-10-CM | POA: Diagnosis not present

## 2023-04-07 LAB — ECHOCARDIOGRAM LIMITED
Calc EF: 20.8 %
Est EF: 20
Height: 73 in
MV VTI: 2.19 cm2
Single Plane A2C EF: 16.4 %
Single Plane A4C EF: 23.2 %
Weight: 3213.42 [oz_av]

## 2023-04-07 LAB — CBC
HCT: 31.3 % — ABNORMAL LOW (ref 39.0–52.0)
Hemoglobin: 9.5 g/dL — ABNORMAL LOW (ref 13.0–17.0)
MCH: 30.6 pg (ref 26.0–34.0)
MCHC: 30.4 g/dL (ref 30.0–36.0)
MCV: 101 fL — ABNORMAL HIGH (ref 80.0–100.0)
Platelets: 159 10*3/uL (ref 150–400)
RBC: 3.1 MIL/uL — ABNORMAL LOW (ref 4.22–5.81)
RDW: 20.7 % — ABNORMAL HIGH (ref 11.5–15.5)
WBC: 8.5 10*3/uL (ref 4.0–10.5)
nRBC: 0 % (ref 0.0–0.2)

## 2023-04-07 LAB — MAGNESIUM: Magnesium: 2.3 mg/dL (ref 1.7–2.4)

## 2023-04-07 LAB — PROTIME-INR
INR: 1.7 — ABNORMAL HIGH (ref 0.8–1.2)
Prothrombin Time: 20.4 s — ABNORMAL HIGH (ref 11.4–15.2)

## 2023-04-07 LAB — BASIC METABOLIC PANEL
Anion gap: 13 (ref 5–15)
BUN: 26 mg/dL — ABNORMAL HIGH (ref 8–23)
CO2: 21 mmol/L — ABNORMAL LOW (ref 22–32)
Calcium: 8.1 mg/dL — ABNORMAL LOW (ref 8.9–10.3)
Chloride: 104 mmol/L (ref 98–111)
Creatinine, Ser: 1.92 mg/dL — ABNORMAL HIGH (ref 0.61–1.24)
GFR, Estimated: 35 mL/min — ABNORMAL LOW (ref >60)
Glucose, Bld: 108 mg/dL — ABNORMAL HIGH (ref 70–99)
Potassium: 3.7 mmol/L (ref 3.5–5.1)
Sodium: 138 mmol/L (ref 135–145)

## 2023-04-07 LAB — HEPARIN LEVEL (UNFRACTIONATED)
Heparin Unfractionated: 0.21 [IU]/mL — ABNORMAL LOW (ref 0.30–0.70)
Heparin Unfractionated: 0.23 [IU]/mL — ABNORMAL LOW (ref 0.30–0.70)
Heparin Unfractionated: 0.4 [IU]/mL (ref 0.30–0.70)

## 2023-04-07 MED ORDER — POLYETHYLENE GLYCOL 3350 17 G PO PACK
17.0000 g | PACK | Freq: Every day | ORAL | Status: DC | PRN
  Administered 2023-04-07 – 2023-04-18 (×2): 17 g via ORAL
  Filled 2023-04-07 (×2): qty 1

## 2023-04-07 MED ORDER — HYALURONIDASE HUMAN 150 UNIT/ML IJ SOLN
150.0000 [IU] | Freq: Once | INTRAMUSCULAR | Status: AC
  Administered 2023-04-07: 150 [IU] via SUBCUTANEOUS
  Filled 2023-04-07: qty 1

## 2023-04-07 MED ORDER — POTASSIUM CHLORIDE CRYS ER 20 MEQ PO TBCR
40.0000 meq | EXTENDED_RELEASE_TABLET | Freq: Once | ORAL | Status: AC
  Administered 2023-04-07: 40 meq via ORAL
  Filled 2023-04-07: qty 2

## 2023-04-07 MED ORDER — FUROSEMIDE 10 MG/ML IJ SOLN
80.0000 mg | Freq: Once | INTRAMUSCULAR | Status: AC
  Administered 2023-04-07: 80 mg via INTRAVENOUS
  Filled 2023-04-07: qty 8

## 2023-04-07 NOTE — Plan of Care (Signed)

## 2023-04-07 NOTE — Progress Notes (Signed)
  Echocardiogram 2D Echocardiogram has been performed.  Austin [name] 04/07/2023, 1:27 PM

## 2023-04-07 NOTE — Progress Notes (Signed)
ANTICOAGULATION CONSULT NOTE - Follow Up  Pharmacy Consult for heparin Indication: atrial fibrillation and mechanical mitral valve   No Known Allergies  Patient Measurements: Height: 6\' 1"  (185.4 cm) Weight: 93.3 kg (205 lb 11 oz) IBW/kg (Calculated) : 79.9 Heparin Dosing Weight: 94.3 kg   Vital Signs: Temp: 97.9 F (36.6 C) (09/11 2045) Temp Source: Oral (09/11 2045) BP: 119/77 (09/12 0000) Pulse Rate: 116 (09/12 0000)  Labs: Recent Labs    04/06/23 1026 04/06/23 1030 04/06/23 1430 04/06/23 2359  HGB  --  9.4*  --   --   HCT  --  31.2*  --   --   PLT  --  146*  --   --   LABPROT  --  21.2*  --   --   INR  --  1.8*  --   --   HEPARINUNFRC  --   --   --  0.23*  CREATININE  --  1.90*  --   --   TROPONINIHS 64*  --  62*  --     Estimated Creatinine Clearance: 35 mL/min (A) (by C-G formula based on SCr of 1.9 mg/dL (H)).   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   Conductive hearing loss    uses hearing aids   Dysrhythmia    A-fib   History of blood transfusion 03/24/2023   Hypothyroidism    Intraspinal abscess    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   Nonischemic cardiomyopathy (HCC)    Psychosexual dysfunction with inhibited sexual excitement    S/P mitral valve replacement    Syncope and collapse    Unspecified sleep apnea    last sleep study 11/07, uses cpap   Ventricular tachycardia (HCC)    VT (ventricular tachycardia) (HCC)     Medications:  Scheduled:   amoxicillin-clavulanate  1 tablet Oral Q12H   Chlorhexidine Gluconate Cloth  6 each Topical Daily   doxycycline  100 mg Oral Q12H   levothyroxine  88 mcg  Oral QAC breakfast   melatonin  5 mg Oral QHS   multivitamin with minerals  1 tablet Oral q AM   mupirocin ointment  1 Application Topical Daily   quiniDINE gluconate  324 mg Oral BID   sodium chloride flush  3 mL Intravenous Q12H    Assessment: 82 yom presenting after ICD shocks - on enoxaparin and warfarin PTA (LD 9/10 PM). PTA regimen 2.5 mg daily except 5 mg MWF. Currently undergoing bridge given recent admission for Barostim implant complicated with L chest wall hematoma requiring I&D - still requiring wound VAC changes.  INR today is 1.8, Hgb 9.4, plt 146. Trop 64. Scr 1.9 (CrCl 35 mL/min). Oozing at   Discussed with vascular - plan to hold warfarin and use heparin for now.   9/12 AM: heparin level returned at 0.23 on 1100 units/hr (subtherapeutic). Per RN, no issues with the heparin infusion running continuously or pauses. No increased oozing at wound vac site with previous Hgb 9.4, plts 146.   Goal of Therapy:  INR 3-3.5  - when warfarin not on hold Heparin level: 0.3-0.5 Monitor platelets by anticoagulation protocol: Yes  Plan:  Hold warfarin  Increase heparin infusion to 1200 units/hr Order heparin level in 8 hours after start Monitor daily HL, CBC, and for s/sx of bleeding   Thank you for allowing pharmacy to participate in this patient's care,  Arabella Merles, PharmD. Clinical Pharmacist 04/07/2023 12:52 AM

## 2023-04-07 NOTE — Progress Notes (Signed)
Advanced Heart Failure Rounding Note  PCP-Cardiologist: None   Subjective:    Remains in slow VT, rate low 100s. Had about 1.5 hr of SR overnight but reverted back.   On amio gtt at 30/hr + quinidine.  K 3.7, Mg 2.3   2.2L in UOP yesterday. Wt down 5 lb. SCr stable at 1.9. SBPs low 100s.   Feels ok today, sitting up in bed watching TV. Denies any dyspnea or chest pain. No syncope/ near syncope.  LEE much improved.    Objective:   Weight Range: 91.1 kg Body mass index is 26.5 kg/m.   Vital Signs:   Temp:  [97.8 F (36.6 C)-98.2 F (36.8 C)] 97.8 F (36.6 C) (09/12 0807) Pulse Rate:  [62-126] 110 (09/12 0700) Resp:  [12-38] 15 (09/12 0700) BP: (96-124)/(59-98) 107/92 (09/12 0700) SpO2:  [87 %-100 %] 98 % (09/12 0700) Weight:  [91.1 kg-93.3 kg] 91.1 kg (09/12 0500) Last BM Date :  (PTA)  Weight change: Filed Weights   04/06/23 1245 04/07/23 0500  Weight: 93.3 kg 91.1 kg    Intake/Output:   Intake/Output Summary (Last 24 hours) at 04/07/2023 0854 Last data filed at 04/07/2023 0820 Gross per 24 hour  Intake 1167.82 ml  Output 2500 ml  Net -1332.18 ml      Physical Exam    General:  Well appearing, elderly. No resp difficulty HEENT: Normal Neck: Supple. JVP 8 cm . Carotids 2+ bilat; no bruits. No lymphadenopathy or thyromegaly appreciated. Cor: PMI nondisplaced. Irregular rhythm and rate + mechanical valve sounds + left upper chest wound vac   Lungs: Clear Abdomen: Soft, nontender, nondistended. No hepatosplenomegaly. No bruits or masses. Good bowel sounds. Extremities: No cyanosis, clubbing, rash, trace-1+ b/l LE R>L edema, chronic venous stasis dermatitis  Neuro: Alert & orientedx3, cranial nerves grossly intact. moves all 4 extremities w/o difficulty. Affect pleasant   Telemetry   Slow VT low 110s, personally reviewed   EKG    No new EKG to review   Labs    CBC Recent Labs    04/06/23 1030 04/07/23 0247  WBC 8.6 8.5  NEUTROABS 7.0  --    HGB 9.4* 9.5*  HCT 31.2* 31.3*  MCV 106.5* 101.0*  PLT 146* 159   Basic Metabolic Panel Recent Labs    16/10/96 1030 04/07/23 0247  NA 138 138  K 3.6 3.7  CL 106 104  CO2 20* 21*  GLUCOSE 97 108*  BUN 28* 26*  CREATININE 1.90* 1.92*  CALCIUM 8.3* 8.1*  MG 1.9 2.3   Liver Function Tests Recent Labs    04/06/23 1030  AST 30  ALT 24  ALKPHOS 78  BILITOT 1.4*  PROT 5.9*  ALBUMIN 3.2*   No results for input(s): "LIPASE", "AMYLASE" in the last 72 hours. Cardiac Enzymes No results for input(s): "CKTOTAL", "CKMB", "CKMBINDEX", "TROPONINI" in the last 72 hours.  BNP: BNP (last 3 results) Recent Labs    02/03/23 1502 03/23/23 1045 04/06/23 1026  BNP 453.3* 352.5* 1,258.7*    ProBNP (last 3 results) Recent Labs    12/16/22 0959  PROBNP 3,692*     D-Dimer No results for input(s): "DDIMER" in the last 72 hours. Hemoglobin A1C No results for input(s): "HGBA1C" in the last 72 hours. Fasting Lipid Panel No results for input(s): "CHOL", "HDL", "LDLCALC", "TRIG", "CHOLHDL", "LDLDIRECT" in the last 72 hours. Thyroid Function Tests Recent Labs    04/06/23 1006  TSH 18.440*    Other results:   Imaging  DG Chest Portable 1 View  Result Date: 04/06/2023 CLINICAL DATA:  Shortness of breath. EXAM: PORTABLE CHEST 1 VIEW COMPARISON:  March 23, 2023. FINDINGS: Stable cardiomediastinal silhouette. Right-sided defibrillator is again noted. Mild central pulmonary vascular congestion is noted with possible bibasilar atelectasis or edema. Degenerative changes seen involving both shoulder joints. Status post mitral valve repair. IMPRESSION: Mild central pulmonary vascular congestion is noted with possible bibasilar atelectasis or edema. Electronically Signed   By: Lupita Raider M.D.   On: 04/06/2023 11:42     Medications:     Scheduled Medications:  amoxicillin-clavulanate  1 tablet Oral Q12H   Chlorhexidine Gluconate Cloth  6 each Topical Daily   doxycycline   100 mg Oral Q12H   levothyroxine  88 mcg Oral QAC breakfast   melatonin  5 mg Oral QHS   multivitamin with minerals  1 tablet Oral q AM   mupirocin ointment  1 Application Topical Daily   quiniDINE gluconate  324 mg Oral BID   sodium chloride flush  3 mL Intravenous Q12H    Infusions:  sodium chloride     amiodarone 60 mg/hr (04/07/23 0824)   heparin 1,200 Units/hr (04/07/23 0700)    PRN Medications: sodium chloride, acetaminophen, HYDROcodone-acetaminophen, nitroGLYCERIN, ondansetron (ZOFRAN) IV, sodium chloride flush    Patient Profile   82 y/o male w/ 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    Now readmitted for ICD shocks for VT and a/c CHF.  Assessment/Plan   1. VT/ ICD Shocks - VT ablation in 2017. Redo VT ablation in 1/23 (Dr. Marquette Saa).  - had slow VT 7/23 and was pace-terminated in the office, then he had an ICD shock for VT later in 7/23  - on amio + quinidine  - now admitted for ICD shocks x 2 for VT  - remains in slow VT 110s. EP managing. Reloading w/ IV amio. Increase rate back to 60 mg/hr today  - continue quinidine 324 mg BID  - keep K > 4.0 and Mg > 2.0      2. Acute on Chronic Systolic Heart Failure: Long-standing, suspected nonischemic cardiomyopathy.  Had RHC on 02/26/21 with normal filling pressures and mildly low cardiac output.  Echo in 4/23 showed  EF 20-25% with diffuse hypokinesis and septal-lateral dyssynchrony, mildly decreased RV systolic function, mechanical MV with normal function, dilated IVC.  He has a [employer] CRT-D device present, but the LV lead is not functional.  He has complete heart block, so has been RV paced chronically with a wide QRS. Recently admitted for post surgical  complication (chest wall hematoma) and HF GDMT + home diuretics were held due to hypotension. Now w/ subsequent volume overload and NYHA Class IIIb symptoms. Diuresing w/ IV Lasix. Edema improved and wt trending down - repeat IV Lasix 80 mg x 1 dose and follow response.  - monitor lytes w/ diuresis, keep K > 4.0 and Mg > 2.0 - GDMT limited by soft BP and b/l CKD  - place TED hoses    3. Chest Wall Hematoma - post barostim complication, in setting of coumadin for mechanical MV - s/p I&D for evacuation of hematoma and placement of wound vac + placement of antibiotic beads on 8/30 - wound vac management per [name] (anticipate change  out next wk)    4. 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    5. CKD IIIb - b/l SCr 1.5-2.0 - sCr 1.9 on admit, stable today  - monitor w/ diuresis    Length of Stay: 1  Shaia Porath, PA-C  04/07/2023, 8:54 AM  Advanced Heart Failure Team Pager [phone] (M-F; 7a - 5p)  Please contact [facility] for night-coverage after hours (5p -7a ) and weekends on [email or web address]

## 2023-04-07 NOTE — Progress Notes (Addendum)
Patient Name: Austin [name] Date of Encounter: 04/07/2023  Primary Cardiologist: None Electrophysiologist: Lewayne Bunting, MD  Interval Summary   The pt reports he "feels strange this am".  States he used the restroom all night.   I/O 2.2L UOP / -1.2L in last 24 hours   Vital Signs    Vitals:   04/07/23 0400 04/07/23 0500 04/07/23 0600 04/07/23 0700  BP: 108/86 103/83 111/87 (!) 107/92  Pulse: (!) 112 (!) 115 (!) 106 (!) 110  Resp: (!) 32 (!) 28 (!) 31 15  Temp:   97.8 F (36.6 C)   TempSrc:   Oral   SpO2: (!) 87% 93% 91% 98%  Weight:  91.1 kg    Height:        Intake/Output Summary (Last 24 hours) at 04/07/2023 0743 Last data filed at 04/07/2023 0700 Gross per 24 hour  Intake 1164.82 ml  Output 2400 ml  Net -1235.18 ml   Filed Weights   04/06/23 1245 04/07/23 0500  Weight: 93.3 kg 91.1 kg    Physical Exam    GEN- chronically ill appearing elderly male in NAD, alert and oriented x 3 today.   Lungs- Clear to ausculation bilaterally, normal work of breathing Cardiac- Regular rate and rhythm, mechanical valve noted, no murmurs, rubs or gallops, JVP+ GI- soft, NT, ND, + BS Extremities- no clubbing or cyanosis. 3+ pitting edema up to thighs Skin: Left chest wound VAC in place with serosanguinous drainage   Gtts: heparin infusion 1200 units/hr, amiodarone 30mg /hr   Telemetry    Slow VT, rate 110's. Had ~ 1.5h of SR overnight then back to VT (personally reviewed)  DEVICE HISTORY:  MDT dual chamber ICD implanted 08/05/2011, gen change 05/04/2017 Hx of VT Nov 2021, VT 160's required external shock   AAD Amiodarone goes back to [facility] Course    Antwann Weidert Rosenbauer is a 82 y.o. male with a history of Mechanical MV s/p MV endocarditis, VT, NICM, CHB and PAF/AFL admitted 04/06/23 for the evaluation of ICD shocks.    Recent (03/17/23) hx of Barostim implant with course complicated by left chest hematoma requiring wound VAC, I&D of chest and was  admitted from 8/24-9/9 for the same, discharged off lasix and GMDT due to hypotension. Developed SOB 9/9 and swelling, lasix was restarted.    Admitted 9/11 with slow VT, ICD shock x2   Assessment & Plan    Ventricular Tachycardia s/p ICD Shock  VT Storm Likely in setting of AoC CHF.  CRT-D -increase amio to 60mg /hr for today  -continue quinidine > pt to bring home supply  -goal electrolytes K+>4, Mg+>2   Acute on Chronic Systolic CHF  LVEF 20-25% 09/2022  -appreciate advanced HF team  -GMDT on hold due to hypotension  -follow I/O's closely   Barostim Implantation  8/22 implant, course complicated by hematoma  -plan per [facility] is to remove early next week  -suspect left chest wound will need to heal by secondary intention   Atrial Flutter  Persistent Atrial Fibrillation  CHA2DS2-VASc 4  -heparin infusion for MVR -monitor for bleeding   Mechanical MVR  Functioning well by ECHO  -consider repeat once stabilized     For questions or updates, please contact [facility] Please consult [email or web address] for contact info under Cardiology/STEMI.  Signed, Canary Brim, MSN, APRN, NP-C, AGACNP-BC [facility] - Electrophysiology  04/07/2023, 7:43 AM  EP Attending  Patient seen and examined. Agree with above. The patient looks better this morning. He is  warm. He is less dyspneic. He went back to NSR for about 2 hours last night spontaneously and then back to VT. He denies fever or chills. On exam he has a regular tachy, with minimal basilar rales. Tele - VT at 115/min. A/P VT - he will continue a mg /min of amio and we will plan ATP tomorrow.  Acute on chronic systolic heart failure - continue IV diuretic therapy.  Mech mitral valve - he will continue IV heparin for now.   Sharlot Gowda Bret Stamour,MD

## 2023-04-07 NOTE — Progress Notes (Signed)
    Subjective  -   Wound vac now with good seal Feels better this am   Physical Exam:  Significantly less chest wall edema now that vac back functioning       Assessment/Plan:    -current plan is to remove the Barostim.  I am not available to do that until Tuesday. He needs more optimization before he is ready for general anesthesia -Continue Abx given length of time vac has been on  as well as his previous issues with infection -Continue to hold coumadin in anticipation of Barostim explant next week.  He remains on heparin  Wells Cosimo Schertzer 04/07/2023 8:43 AM --  Vitals:   04/07/23 0700 04/07/23 0807  BP: (!) 107/92   Pulse: (!) 110   Resp: 15   Temp:  97.8 F (36.6 C)  SpO2: 98%     Intake/Output Summary (Last 24 hours) at 04/07/2023 0843 Last data filed at 04/07/2023 0820 Gross per 24 hour  Intake 1167.82 ml  Output 2500 ml  Net -1332.18 ml     Laboratory CBC    Component Value Date/Time   WBC 8.5 04/07/2023 0247   HGB 9.5 (L) 04/07/2023 0247   HGB 16.6 12/04/2020 1445   HCT 31.3 (L) 04/07/2023 0247   HCT 50.1 12/04/2020 1445   PLT 159 04/07/2023 0247   PLT 158 12/04/2020 1445    BMET    Component Value Date/Time   NA 138 04/07/2023 0247   NA 139 01/29/2021 0853   K 3.7 04/07/2023 0247   CL 104 04/07/2023 0247   CO2 21 (L) 04/07/2023 0247   GLUCOSE 108 (H) 04/07/2023 0247   BUN 26 (H) 04/07/2023 0247   BUN 18 01/29/2021 0853   CREATININE 1.92 (H) 04/07/2023 0247   CREATININE 1.22 (H) 09/12/2015 0846   CALCIUM 8.1 (L) 04/07/2023 0247   GFRNONAA 35 (L) 04/07/2023 0247   GFRAA 56 (L) 04/30/2020 1144    COAG Lab Results  Component Value Date   INR 1.8 (H) 04/06/2023   INR 1.6 (A) 04/04/2023   INR 1.1 04/01/2023   No results found for: "PTT"  Antibiotics Anti-infectives (From admission, onward)    Start     Dose/Rate Route Frequency Ordered Stop   04/06/23 2200  amoxicillin-clavulanate (AUGMENTIN) 875-125 MG per tablet 1 tablet         1 tablet Oral Every 12 hours 04/06/23 1400     04/06/23 1330  doxycycline (VIBRA-TABS) tablet 100 mg        100 mg Oral Every 12 hours 04/06/23 1243          V. Charlena Cross, M.D., [facility] [place] Office: [phone] Pager:  [phone]

## 2023-04-07 NOTE — Progress Notes (Signed)
ANTICOAGULATION CONSULT NOTE - Follow Up  Pharmacy Consult for heparin Indication: atrial fibrillation and mechanical mitral valve   No Known Allergies  Patient Measurements: Height: 6\' 1"  (185.4 cm) Weight: 91.1 kg (200 lb 13.4 oz) IBW/kg (Calculated) : 79.9 Heparin Dosing Weight: 94.3 kg   Vital Signs: Temp: 98.1 F (36.7 C) (09/12 2000) Temp Source: Oral (09/12 2000) BP: 112/88 (09/12 2100) Pulse Rate: 114 (09/12 2100)  Labs: Recent Labs    04/06/23 1026 04/06/23 1030 04/06/23 1430 04/06/23 2359 04/07/23 0247 04/07/23 0518 04/07/23 1116 04/07/23 2121  HGB  --  9.4*  --   --  9.5*  --   --   --   HCT  --  31.2*  --   --  31.3*  --   --   --   PLT  --  146*  --   --  159  --   --   --   LABPROT  --  21.2*  --   --   --   --  20.4*  --   INR  --  1.8*  --   --   --   --  1.7*  --   HEPARINUNFRC  --   --   --  0.23*  --  0.21*  --  0.40  CREATININE  --  1.90*  --   --  1.92*  --   --   --   TROPONINIHS 64*  --  62*  --   --   --   --   --     Estimated Creatinine Clearance: 34.7 mL/min (A) (by C-G formula based on SCr of 1.92 mg/dL (H)).   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   Conductive hearing loss    uses hearing aids   Dysrhythmia    A-fib   History of blood transfusion 03/24/2023   Hypothyroidism    Intraspinal abscess    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   Nonischemic cardiomyopathy (HCC)    Psychosexual dysfunction with inhibited sexual excitement    S/P mitral valve replacement    Syncope and collapse    Unspecified sleep apnea    last sleep study 11/07, uses cpap   Ventricular tachycardia (HCC)     VT (ventricular tachycardia) (HCC)     Medications:  Scheduled:   amoxicillin-clavulanate  1 tablet Oral Q12H   Chlorhexidine Gluconate Cloth  6 each Topical Daily   doxycycline  100 mg Oral Q12H   levothyroxine  88 mcg Oral QAC breakfast   melatonin  5 mg Oral QHS   multivitamin with minerals  1 tablet Oral q AM   mupirocin ointment  1 Application Topical Daily   quiniDINE gluconate  324 mg Oral BID   sodium chloride flush  3 mL Intravenous Q12H    Assessment: 82 yom presenting after ICD shocks - on enoxaparin and warfarin PTA (LD 9/10 PM). PTA regimen 2.5 mg daily except 5 mg MWF. Currently undergoing bridge given recent admission for Barostim implant complicated with L chest wall hematoma requiring I&D - still requiring wound VAC changes.  INR today is 1.8, Hgb 9.4, plt 146.  Trop 64. Scr 1.9 (CrCl 35 mL/min). Oozing at   Discussed with vascular - plan to hold warfarin and use heparin for now.   Heparin level this evening is at goal at 0.4.  No overt bleeding or complications noted.  Goal of Therapy:  INR 3-3.5  - when warfarin not on hold Heparin level: 0.3-0.5 Monitor platelets by anticoagulation protocol: Yes   Plan:  Hold warfarin  Continue IV heparin at 1200 units/hr Monitor daily heparin level, CBC, and for s/sx of bleeding   Thank you for allowing pharmacy to participate in this patient's care,  Jenetta Downer, [facility] Clinical Pharmacist  04/07/2023 10:02 PM   [facility] pharmacy phone numbers are listed on [email or web address]

## 2023-04-08 ENCOUNTER — Ambulatory Visit: Payer: Medicare Other

## 2023-04-08 DIAGNOSIS — I472 Ventricular tachycardia, unspecified: Secondary | ICD-10-CM | POA: Diagnosis not present

## 2023-04-08 DIAGNOSIS — I5023 Acute on chronic systolic (congestive) heart failure: Secondary | ICD-10-CM | POA: Diagnosis not present

## 2023-04-08 LAB — HEPARIN LEVEL (UNFRACTIONATED): Heparin Unfractionated: 0.36 [IU]/mL (ref 0.30–0.70)

## 2023-04-08 LAB — PROTIME-INR
INR: 1.9 — ABNORMAL HIGH (ref 0.8–1.2)
Prothrombin Time: 22.1 s — ABNORMAL HIGH (ref 11.4–15.2)

## 2023-04-08 LAB — CBC
HCT: 36.9 % — ABNORMAL LOW (ref 39.0–52.0)
Hemoglobin: 11.2 g/dL — ABNORMAL LOW (ref 13.0–17.0)
MCH: 31.5 pg (ref 26.0–34.0)
MCHC: 30.4 g/dL (ref 30.0–36.0)
MCV: 103.9 fL — ABNORMAL HIGH (ref 80.0–100.0)
Platelets: 178 10*3/uL (ref 150–400)
RBC: 3.55 MIL/uL — ABNORMAL LOW (ref 4.22–5.81)
RDW: 20.5 % — ABNORMAL HIGH (ref 11.5–15.5)
WBC: 13.7 10*3/uL — ABNORMAL HIGH (ref 4.0–10.5)
nRBC: 0 % (ref 0.0–0.2)

## 2023-04-08 LAB — BASIC METABOLIC PANEL
Anion gap: 11 (ref 5–15)
BUN: 33 mg/dL — ABNORMAL HIGH (ref 8–23)
CO2: 20 mmol/L — ABNORMAL LOW (ref 22–32)
Calcium: 8.4 mg/dL — ABNORMAL LOW (ref 8.9–10.3)
Chloride: 104 mmol/L (ref 98–111)
Creatinine, Ser: 2.32 mg/dL — ABNORMAL HIGH (ref 0.61–1.24)
GFR, Estimated: 28 mL/min — ABNORMAL LOW (ref >60)
Glucose, Bld: 151 mg/dL — ABNORMAL HIGH (ref 70–99)
Potassium: 4.4 mmol/L (ref 3.5–5.1)
Sodium: 135 mmol/L (ref 135–145)

## 2023-04-08 LAB — MAGNESIUM: Magnesium: 2.4 mg/dL (ref 1.7–2.4)

## 2023-04-08 MED ORDER — FUROSEMIDE 10 MG/ML IJ SOLN
80.0000 mg | Freq: Two times a day (BID) | INTRAMUSCULAR | Status: DC
  Administered 2023-04-08 – 2023-04-10 (×5): 80 mg via INTRAVENOUS
  Filled 2023-04-08 (×5): qty 8

## 2023-04-08 MED ORDER — QUINIDINE GLUCONATE ER 324 MG PO TBCR
324.0000 mg | EXTENDED_RELEASE_TABLET | Freq: Two times a day (BID) | ORAL | Status: DC
  Administered 2023-04-08 – 2023-04-22 (×29): 324 mg via ORAL
  Filled 2023-04-08 (×36): qty 1

## 2023-04-08 MED ORDER — METOLAZONE 2.5 MG PO TABS
2.5000 mg | ORAL_TABLET | Freq: Once | ORAL | Status: AC
  Administered 2023-04-08: 2.5 mg via ORAL
  Filled 2023-04-08: qty 1

## 2023-04-08 NOTE — Progress Notes (Addendum)
Advanced Heart Failure Rounding Note  PCP-Cardiologist: None   Subjective:    Paced out of VT this am, reverted back to slow VT 100s-110s within an hour. Remains on quinidine + lasix gtt at 60/hr  975 cc UOP + 2 unmeasured voids with IV lasix yesterday. He feels UOP has significantly slowed.  Awaiting AM labs  Wt up 2 lb.   No dyspnea at rest.   Objective:   Weight Range: 92 kg Body mass index is 26.76 kg/m.   Vital Signs:   Temp:  [97.6 F (36.4 C)-98.1 F (36.7 C)] 97.6 F (36.4 C) (09/13 0300) Pulse Rate:  [108-116] 110 (09/13 0600) Resp:  [12-30] 16 (09/13 0600) BP: (97-122)/(71-93) 116/90 (09/13 0600) SpO2:  [88 %-100 %] 100 % (09/13 0600) Weight:  [92 kg] 92 kg (09/13 0500) Last BM Date : 04/07/23  Weight change: Filed Weights   04/06/23 1245 04/07/23 0500 04/08/23 0500  Weight: 93.3 kg 91.1 kg 92 kg    Intake/Output:   Intake/Output Summary (Last 24 hours) at 04/08/2023 0901 Last data filed at 04/08/2023 0650 Gross per 24 hour  Intake 1239.2 ml  Output 965 ml  Net 274.2 ml      Physical Exam    General:  Well appearing elderly male HEENT: normal Neck: supple. JVP 10-12. Carotids 2+ bilat; no bruits.  Cor: PMI nondisplaced. Regular rate & rhythm, tachy. No rubs, gallops or murmurs. Crisp mechanical click. Left upper chest wound vac. Lungs: clear Abdomen: soft, nontender, nondistended.  Extremities: no cyanosis, clubbing, rash, 1+ b/l edema with chronic venous stasis changes Neuro: alert & orientedx3. Affect pleasant    Telemetry   Slow VT low 110s, personally reviewed   EKG    No new EKG to review   Labs    CBC Recent Labs    04/06/23 1030 04/07/23 0247  WBC 8.6 8.5  NEUTROABS 7.0  --   HGB 9.4* 9.5*  HCT 31.2* 31.3*  MCV 106.5* 101.0*  PLT 146* 159   Basic Metabolic Panel Recent Labs    30/86/57 1030 04/07/23 0247  NA 138 138  K 3.6 3.7  CL 106 104  CO2 20* 21*  GLUCOSE 97 108*  BUN 28* 26*  CREATININE 1.90*  1.92*  CALCIUM 8.3* 8.1*  MG 1.9 2.3   Liver Function Tests Recent Labs    04/06/23 1030  AST 30  ALT 24  ALKPHOS 78  BILITOT 1.4*  PROT 5.9*  ALBUMIN 3.2*   No results for input(s): "LIPASE", "AMYLASE" in the last 72 hours. Cardiac Enzymes No results for input(s): "CKTOTAL", "CKMB", "CKMBINDEX", "TROPONINI" in the last 72 hours.  BNP: BNP (last 3 results) Recent Labs    02/03/23 1502 03/23/23 1045 04/06/23 1026  BNP 453.3* 352.5* 1,258.7*    ProBNP (last 3 results) Recent Labs    12/16/22 0959  PROBNP 3,692*     D-Dimer No results for input(s): "DDIMER" in the last 72 hours. Hemoglobin A1C No results for input(s): "HGBA1C" in the last 72 hours. Fasting Lipid Panel No results for input(s): "CHOL", "HDL", "LDLCALC", "TRIG", "CHOLHDL", "LDLDIRECT" in the last 72 hours. Thyroid Function Tests Recent Labs    04/06/23 1006  TSH 18.440*    Other results:   Imaging    ECHOCARDIOGRAM LIMITED  Result Date: 04/07/2023    ECHOCARDIOGRAM LIMITED REPORT   Patient Name:   Verna Eans Date of Exam: 04/07/2023 Medical Rec #:  [id number]  Height:       73.0 in Accession #:    [id number]             Weight:       200.8 lb Date of Birth:  10-09-1940               BSA:          2.155 m Patient Age:    82 years               BP:           102/92 mmHg Patient Gender: M                      HR:           108 bpm. 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 Cardiomyopathy and CHF, Defibrillator and                 Abnormal ECG, Arrythmias:Atrial Fibrillation, Atrial Flutter and                 VT; Signs/Symptoms:Dyspnea and Shortness of Breath.                  Mitral Valve: unknown size mechanical valve valve is present in                 the mitral position.   Sonographer:    Sheralyn Boatman RDCS Referring Phys: Dorthula Nettles  Sonographer Comments: Patient has wound vac in parasternal region. IMPRESSIONS  1. Left ventricular ejection fraction, by estimation, is 20%. The left ventricle has severely decreased function. The left ventricle demonstrates global hypokinesis. No LV thrombus noted. Indeterminant diastolic function.  2. Peak RV-RA gradient 19 mmHg. IVC not visualized. Right ventricular systolic function is mildly reduced. The right ventricular size is normal.  3. Mechanical mitral valve with mean gradient 8 mmHg, increased from prior. No mitral regurgitation noted though shadowing from prosthetic valve makes this difficult to discern.  4. Tricuspid valve regurgitation is moderate.  5. The aortic valve is tricuspid. There is moderate calcification of the aortic valve. Doppler evaluation of aortic valve not done.  6. Limited echo. FINDINGS  Left Ventricle: Left ventricular ejection fraction, by estimation, is 20%. The left ventricle has severely decreased function. The left ventricle demonstrates global hypokinesis. Left ventricular diastolic parameters are indeterminate. Right Ventricle: Peak RV-RA gradient 19 mmHg. IVC not visualized. The right ventricular size is normal. No increase in right ventricular wall thickness. Right ventricular systolic function is mildly reduced. Mitral Valve: Mechanical mitral valve with mean gradient 8 mmHg, increased from prior. No mitral regurgitation noted though shadowing from prosthetic valve makes this difficult to discern. There is a unknown size mechanical valve present in the mitral position. MV peak gradient, 10.4 mmHg. The mean mitral valve gradient is 8.0 mmHg. Tricuspid Valve: The tricuspid valve is normal in structure. Tricuspid valve regurgitation is moderate. Aortic Valve: The aortic valve is tricuspid. There is moderate calcification of the aortic valve. Venous: The inferior vena cava was not well visualized. Additional  Comments: A device lead is visualized in the right ventricle. Spectral Doppler performed. Color Doppler performed.  LEFT VENTRICLE PLAX 2D LVOT  diam:     2.70 cm LV SV:         55 LV SV Index:   26 LVOT Area:     5.73 cm  LV Volumes (MOD) LV vol d, MOD A2C: 177.0 ml LV vol d, MOD A4C: 155.0 ml LV vol s, MOD A2C: 148.0 ml LV vol s, MOD A4C: 119.0 ml LV SV MOD A2C:     29.0 ml LV SV MOD A4C:     155.0 ml LV SV MOD BP:      35.4 ml AORTIC VALVE LVOT Vmax:   79.60 cm/s LVOT Vmean:  47.500 cm/s LVOT VTI:    0.096 m MITRAL VALVE              TRICUSPID VALVE MV Area VTI:  2.19 cm    TR Peak grad:   19.0 mmHg MV Peak grad: 10.4 mmHg   TR Vmax:        218.00 cm/s MV Mean grad: 8.0 mmHg MV Vmax:      1.62 m/s    SHUNTS MV Vmean:     110.9 cm/s  Systemic VTI:  0.10 m                           Systemic Diam: 2.70 cm Dalton McleanMD Electronically signed by Wilfred Lacy Signature Date/Time: 04/07/2023/2:14:27 PM    Final      Medications:     Scheduled Medications:  amoxicillin-clavulanate  1 tablet Oral Q12H   Chlorhexidine Gluconate Cloth  6 each Topical Daily   doxycycline  100 mg Oral Q12H   levothyroxine  88 mcg Oral QAC breakfast   melatonin  5 mg Oral QHS   multivitamin with minerals  1 tablet Oral q AM   mupirocin ointment  1 Application Topical Daily   quiniDINE gluconate  324 mg Oral BID   sodium chloride flush  3 mL Intravenous Q12H    Infusions:  sodium chloride     amiodarone Stopped (04/08/23 0557)   heparin 1,200 Units/hr (04/08/23 0600)    PRN Medications: sodium chloride, acetaminophen, HYDROcodone-acetaminophen, nitroGLYCERIN, ondansetron (ZOFRAN) IV, polyethylene glycol, sodium chloride flush    Patient Profile   82 y/o male w/ 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    Now readmitted for ICD shocks for VT and a/c CHF.  Assessment/Plan   1. VT/ ICD Shocks - VT ablation in 2017. Redo VT ablation in 1/23 (Dr. Marquette Saa).  - had slow VT 7/23 and was pace-terminated in the office, then he had an ICD shock for VT later in 7/23  - now admitted for ICD shocks x 2 for VT  - remains in slow VT 110s. EP managing. Paced out of VT this am but reverted back to VT within an hour. Reloading w/ IV amio, currently on 60/hr - continue quinidine 324 mg BID  - keep K > 4.0 and Mg > 2.0   2. Acute on Chronic Systolic Heart Failure: Long-standing, suspected nonischemic cardiomyopathy.  Had RHC on 02/26/21 with normal filling pressures and mildly low cardiac output.  Echo in 4/23 showed  EF 20-25% with diffuse hypokinesis and septal-lateral dyssynchrony, mildly decreased RV systolic function, mechanical MV with normal function, dilated IVC.  He has a [employer] CRT-D device present, but the LV lead is not functional.  He has complete heart block, so has been RV paced chronically with a wide QRS. Recently admitted for post surgical complication (chest wall hematoma) and HF GDMT + home diuretics were held due to hypotension.  - Now w/ subsequent volume overload and NYHA Class IIIb symptoms. Give 80 mg lasix IV BID + metolazone 2.5 today. Check bladder scan. - monitor lytes w/ diuresis, keep K > 4.0 and Mg > 2.0 - GDMT limited by soft BP and b/l CKD  - place TED hose   3. Chest Wall Hematoma - post barostim complication, in setting of coumadin for mechanical MV - s/p I&D for evacuation of hematoma and placement of wound vac + placement of antibiotic beads on 8/30 - wound vac management per [name]  - Planning barostim removal early next week   4. 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    5. CKD IIIb - b/l SCr 1.5-2.0 - sCr 1.9 on admit, labs today pending - monitor w/  diuresis    Length of Stay: 2  Isaak Delmundo N, PA-C  04/08/2023, 9:01 AM  Advanced Heart Failure Team Pager [phone] (M-F; 7a - 5p)  Please contact [facility] for night-coverage after hours (5p -7a ) and weekends on [email or web address]

## 2023-04-08 NOTE — Progress Notes (Addendum)
  Progress Note    04/08/2023 7:27 AM * No surgery date entered *  Subjective:  sitting up in chair, was sleeping before I walked into the room    Vitals:   04/08/23 0500 04/08/23 0600  BP: 106/83 (!) 116/90  Pulse: (!) 109 (!) 110  Resp: (!) 21 16  Temp:    SpO2: (!) 88% 100%    Physical Exam: General:  no acute distress Lungs:  nonlabored, on RA Incisions:  L chest with wound vac with good seal, significantly less chest wall edema  CBC    Component Value Date/Time   WBC 8.5 04/07/2023 0247   RBC 3.10 (L) 04/07/2023 0247   HGB 9.5 (L) 04/07/2023 0247   HGB 16.6 12/04/2020 1445   HCT 31.3 (L) 04/07/2023 0247   HCT 50.1 12/04/2020 1445   PLT 159 04/07/2023 0247   PLT 158 12/04/2020 1445   MCV 101.0 (H) 04/07/2023 0247   MCV 94 12/04/2020 1445   MCH 30.6 04/07/2023 0247   MCHC 30.4 04/07/2023 0247   RDW 20.7 (H) 04/07/2023 0247   RDW 13.7 12/04/2020 1445   LYMPHSABS 0.3 (L) 04/06/2023 1030   MONOABS 1.1 (H) 04/06/2023 1030   EOSABS 0.1 04/06/2023 1030   BASOSABS 0.1 04/06/2023 1030    BMET    Component Value Date/Time   NA 138 04/07/2023 0247   NA 139 01/29/2021 0853   K 3.7 04/07/2023 0247   CL 104 04/07/2023 0247   CO2 21 (L) 04/07/2023 0247   GLUCOSE 108 (H) 04/07/2023 0247   BUN 26 (H) 04/07/2023 0247   BUN 18 01/29/2021 0853   CREATININE 1.92 (H) 04/07/2023 0247   CREATININE 1.22 (H) 09/12/2015 0846   CALCIUM 8.1 (L) 04/07/2023 0247   GFRNONAA 35 (L) 04/07/2023 0247   GFRAA 56 (L) 04/30/2020 1144    INR    Component Value Date/Time   INR 1.7 (H) 04/07/2023 1116     Intake/Output Summary (Last 24 hours) at 04/08/2023 0727 Last data filed at 04/08/2023 0650 Gross per 24 hour  Intake 1242.2 ml  Output 1065 ml  Net 177.2 ml      Assessment/Plan:  82 y.o. male with left chest wall seroma surrounding Barostim  -Doing well this morning. Feels more comfortable now that vac is functioning well -L chest wall wound vac with good seal. Chest  wall is much less edematous  -Continue holding Coumadin. Plans for Barostim removal early next week. Patient can remain on heparin for now   [facility], [place] [facility] [phone] 04/08/2023 7:27 AM   I have seen and evaluated the patient. I agree with the PA note as documented above.  VAC working to left chest wall.  Discussed plans for removal of Barostem on Tuesday with [name].  Please continue to hold Coumadin.  Cephus Shelling, MD [facility] [place] Office: [phone]

## 2023-04-08 NOTE — Progress Notes (Signed)
ANTICOAGULATION CONSULT NOTE - Follow Up  Pharmacy Consult for heparin Indication: atrial fibrillation and mechanical mitral valve   No Known Allergies  Patient Measurements: Height: 6\' 1"  (185.4 cm) Weight: 92 kg (202 lb 13.2 oz) IBW/kg (Calculated) : 79.9 Heparin Dosing Weight: 94.3 kg   Vital Signs: Temp: 97.8 F (36.6 C) (09/13 1127) Temp Source: Oral (09/13 1127) BP: 107/86 (09/13 1100) Pulse Rate: 100 (09/13 1100)  Labs: Recent Labs     0000 04/06/23 1026 04/06/23 1030 04/06/23 1430 04/06/23 2359 04/07/23 0247 04/07/23 0518 04/07/23 1116 04/07/23 2121 04/08/23 1024  HGB   < >  --  9.4*  --   --  9.5*  --   --   --  11.2*  HCT  --   --  31.2*  --   --  31.3*  --   --   --  36.9*  PLT  --   --  146*  --   --  159  --   --   --  178  LABPROT  --   --  21.2*  --   --   --   --  20.4*  --  22.1*  INR  --   --  1.8*  --   --   --   --  1.7*  --  1.9*  HEPARINUNFRC  --   --   --   --    < >  --  0.21*  --  0.40 0.36  CREATININE  --   --  1.90*  --   --  1.92*  --   --   --  2.32*  TROPONINIHS  --  64*  --  62*  --   --   --   --   --   --    < > = values in this interval not displayed.    Estimated Creatinine Clearance: 28.7 mL/min (A) (by C-G formula based on SCr of 2.32 mg/dL (H)).   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   Conductive hearing loss    uses hearing aids   Dysrhythmia    A-fib   History of blood transfusion 03/24/2023   Hypothyroidism    Intraspinal abscess    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   Nonischemic cardiomyopathy (HCC)    Psychosexual dysfunction with inhibited sexual excitement    S/P  mitral valve replacement    Syncope and collapse    Unspecified sleep apnea    last sleep study 11/07, uses cpap   Ventricular tachycardia (HCC)    VT (ventricular tachycardia) (HCC)     Medications:  Scheduled:   amoxicillin-clavulanate  1 tablet Oral Q12H   Chlorhexidine Gluconate Cloth  6 each Topical Daily   doxycycline  100 mg Oral Q12H   furosemide  80 mg Intravenous BID   levothyroxine  88 mcg Oral QAC breakfast   melatonin  5 mg Oral QHS   multivitamin with minerals  1 tablet Oral q AM   mupirocin ointment  1 Application Topical Daily   sodium chloride flush  3 mL Intravenous Q12H    Assessment: 82 yom presenting after  ICD shocks - on enoxaparin and warfarin PTA (LD 9/10 PM). PTA regimen 2.5 mg daily except 5 mg MWF. Currently undergoing bridge given recent admission for Barostim implant complicated with L chest wall hematoma requiring I&D - still requiring wound VAC changes.  INR today is 1.9 Hgb 9s stable PLTC> 150 stable. Trop 64. Scr 2.3  No over bleeding  - some oozing at hematoma sight  - now with VAC in place  Discussed with vascular - plan to hold warfarin and use heparin for now.   Heparin level today is at goal at 0.36 on heparin infusion 1200 uts/hr.    Goal of Therapy:  INR 3-3.5  - when warfarin not on hold Heparin level: 0.3-0.5 Monitor platelets by anticoagulation protocol: Yes   Plan:  Hold warfarin  Continue IV heparin at 1200 units/hr Monitor daily heparin level, CBC, and for s/sx of bleeding     Leota Sauers Pharm.D. CPP, BCPS Clinical Pharmacist [phone] 04/08/2023 12:07 PM    [facility] pharmacy phone numbers are listed on [email or web address]

## 2023-04-08 NOTE — Progress Notes (Addendum)
Patient Name: Austin [name] Date of Encounter: 04/08/2023  Primary Cardiologist: None Electrophysiologist: Lewayne Bunting, MD  Interval Summary   Pt reports he did not rest well last night. IV infiltrated in right arm, weeping but less swollen this am. Denies other acute issues.   I/O / +1107ml in last 24h  Vital Signs    Vitals:   04/08/23 0300 04/08/23 0400 04/08/23 0500 04/08/23 0600  BP: 112/84 119/88 106/83 (!) 116/90  Pulse: (!) 108 (!) 109 (!) 109 (!) 110  Resp: (!) 27 12 (!) 21 16  Temp: 97.6 F (36.4 C)     TempSrc: Oral     SpO2: 93% 94% (!) 88% 100%  Weight:   92 kg   Height:        Intake/Output Summary (Last 24 hours) at 04/08/2023 0712 Last data filed at 04/08/2023 0650 Gross per 24 hour  Intake 1242.2 ml  Output 1065 ml  Net 177.2 ml   Filed Weights   04/06/23 1245 04/07/23 0500 04/08/23 0500  Weight: 93.3 kg 91.1 kg 92 kg    Physical Exam    GEN- The patient is well appearing, alert and oriented x 3 today.   Lungs- Clear to ausculation bilaterally, normal work of breathing Cardiac- Regular / slow VT at ~100bpm, no murmurs, mechanical valve noise GI- soft, NT, ND, + BS Extremities- no clubbing or cyanosis. 2+ edema, BLE discoloration of chronic venous stasis   Telemetry    Slow VT with rate ~ 100bpm (personally reviewed)  DEVICE HISTORY:  MDT dual chamber ICD implanted 08/05/2011, gen change 05/04/2017 Hx of VT Nov 2021, VT 160's required external shock   AAD Amiodarone goes back to [facility] Course    Austin [name] is a 82 y.o. male with a history of Mechanical MV s/p MV endocarditis, VT, NICM, CHB and PAF/AFL admitted 04/06/23 for the evaluation of ICD shocks.     Recent (03/17/23) hx of Barostim implant with course complicated by left chest hematoma requiring wound VAC, I&D of chest and was admitted from 8/24-9/9 for the same, discharged off lasix and GMDT due to hypotension. Developed SOB 9/9 and swelling, lasix  was restarted.     Admitted 9/11 with slow VT, ICD shock x2.   Assessment & Plan    Ventricular Tachycardia s/p ICD Shock  VT Storm Likely in setting of AoC CHF.  CRT-D -continue amiodarone 60mg /hr IV  -continue quinidine > pt has to bring home supply  -attempted burst pacing (VT cycle length , burst pace at which was successful) at bedside with Dr. Ladona Ridgel > pt converted to VP with underlying AF > lasting ~ 1.5hr -goal K+ >4, Mg+ >2   Acute on Chronic Systolic CHF  LVEF 20-25% 09/2022  -GMDT as tolerated per Advanced HF Team  -monitor I/O's    Barostim Implantation  8/22 implant, course complicated by hematoma  -plan per VVS is to remove early next week (Tu?) -continue ABX -anticipate left chest will heal by secondary intention     Atrial Flutter  Persistent Atrial Fibrillation  CHA2DS2-VASc 4  -heparin infusion for MVR -monitor for bleeding    Mechanical MVR  Functioning well by ECHO  -consider repeat once stabilized      For questions or updates, please contact [facility] Please consult [email or web address] for contact info under Cardiology/STEMI.  Signed, Canary Brim, MSN, APRN, NP-C, AGACNP-BC [facility] - Electrophysiology  04/08/2023, 7:12 AM  EP Attending  Patient seen and examined.  He remains in VT this morning. He did not diurese any more yesterday. He has been on amiodarone with IV infiltration. He has been paced back into atrial flutter by me at a ventricular cycle length of 450 ms. We could not pace his atrial back to NSR, going down to 200 ms. Continue IV amio. More pacing to follow. I think he will need his amio level up and his fluid overload down before we will be able to keep him out of VT.  Sharlot Gowda Kaneisha Ellenberger,MD

## 2023-04-08 NOTE — TOC Progression Note (Signed)
Transition of Care [facility]) - Progression Note    Patient Details  Name: Austin [name] MRN: [id number] Date of Birth: 10/27/1940  Transition of Care [facility]) CM/SW Contact  Elliot Cousin, RN Phone Number: [phone] 04/08/2023, 12:31 PM  Clinical Narrative:   Patient has [employer] wound vac, and HH with [facility]. [place] is from [facility] [place]. Will continue to follow for dc needs.     Expected Discharge Plan: Home w Home Health Services Barriers to Discharge: Continued Medical Work up  Expected Discharge Plan and Services     Post Acute Care Choice: Home Health Living arrangements for the past 2 months: Independent Living Facility                                       Social Determinants of Health (SDOH) Interventions SDOH Screenings   Food Insecurity: No Food Insecurity (04/06/2023)  Housing: Low Risk  (04/06/2023)  Transportation Needs: No Transportation Needs (04/06/2023)  Utilities: Not At Risk (04/06/2023)  Alcohol Screen: Low Risk  (03/08/2023)  Depression (PHQ2-9): Low Risk  (03/08/2023)  Financial Resource Strain: Low Risk  (03/08/2023)  Physical Activity: Insufficiently Active (03/08/2023)  Social Connections: Moderately Integrated (03/08/2023)  Stress: No Stress Concern Present (03/08/2023)  Tobacco Use: Low Risk  (04/06/2023)  Health Literacy: Adequate Health Literacy (03/08/2023)    Readmission Risk Interventions     No data to display

## 2023-04-08 NOTE — Progress Notes (Addendum)
  Pt paced out of VT this am manually.   Only lasted for about 1.5 hours.  This afternoon self converted again for ~30 minutes.   We were again able to pace pt out of his VT using a CL of 450 ms with one attempt.    Continue IV amiodarone.   We did not attempt to place his flutter this time, given likely need to hold OAC next week for wash out.    Of note, with further inquiry regarding his quinidine, they did not have at home, as during previous admission it was given to the pharmacy for dosing and either exhausted or not returned.   Spoke with pharmacy again today who were able to find quinidine and will restart at 324 mg BID.   Casimiro Needle [address] [place], PA-C  04/08/2023 4:03 PM

## 2023-04-09 ENCOUNTER — Inpatient Hospital Stay (HOSPITAL_COMMUNITY): Payer: Medicare Other

## 2023-04-09 ENCOUNTER — Other Ambulatory Visit: Payer: Self-pay

## 2023-04-09 DIAGNOSIS — I5023 Acute on chronic systolic (congestive) heart failure: Secondary | ICD-10-CM | POA: Diagnosis not present

## 2023-04-09 DIAGNOSIS — I472 Ventricular tachycardia, unspecified: Secondary | ICD-10-CM | POA: Diagnosis not present

## 2023-04-09 LAB — CBC
HCT: 32.5 % — ABNORMAL LOW (ref 39.0–52.0)
Hemoglobin: 9.9 g/dL — ABNORMAL LOW (ref 13.0–17.0)
MCH: 30.6 pg (ref 26.0–34.0)
MCHC: 30.5 g/dL (ref 30.0–36.0)
MCV: 100.3 fL — ABNORMAL HIGH (ref 80.0–100.0)
Platelets: 153 10*3/uL (ref 150–400)
RBC: 3.24 MIL/uL — ABNORMAL LOW (ref 4.22–5.81)
RDW: 19.9 % — ABNORMAL HIGH (ref 11.5–15.5)
WBC: 11 10*3/uL — ABNORMAL HIGH (ref 4.0–10.5)
nRBC: 0 % (ref 0.0–0.2)

## 2023-04-09 LAB — BASIC METABOLIC PANEL
Anion gap: 10 (ref 5–15)
BUN: 33 mg/dL — ABNORMAL HIGH (ref 8–23)
CO2: 24 mmol/L (ref 22–32)
Calcium: 8.1 mg/dL — ABNORMAL LOW (ref 8.9–10.3)
Chloride: 99 mmol/L (ref 98–111)
Creatinine, Ser: 2.21 mg/dL — ABNORMAL HIGH (ref 0.61–1.24)
GFR, Estimated: 29 mL/min — ABNORMAL LOW (ref >60)
Glucose, Bld: 113 mg/dL — ABNORMAL HIGH (ref 70–99)
Potassium: 3.4 mmol/L — ABNORMAL LOW (ref 3.5–5.1)
Sodium: 133 mmol/L — ABNORMAL LOW (ref 135–145)

## 2023-04-09 LAB — MAGNESIUM: Magnesium: 2.1 mg/dL (ref 1.7–2.4)

## 2023-04-09 LAB — PROTIME-INR
INR: 1.9 — ABNORMAL HIGH (ref 0.8–1.2)
Prothrombin Time: 22.4 seconds — ABNORMAL HIGH (ref 11.4–15.2)

## 2023-04-09 LAB — HEPARIN LEVEL (UNFRACTIONATED)
Heparin Unfractionated: <0.1 [IU]/mL — ABNORMAL LOW (ref 0.30–0.70)
Heparin Unfractionated: <0.1 [IU]/mL — ABNORMAL LOW (ref 0.30–0.70)

## 2023-04-09 MED ORDER — HYALURONIDASE HUMAN 150 UNIT/ML IJ SOLN
150.0000 [IU] | Freq: Once | INTRAMUSCULAR | Status: AC
  Administered 2023-04-09: 150 [IU] via SUBCUTANEOUS
  Filled 2023-04-09 (×2): qty 1

## 2023-04-09 MED ORDER — METOLAZONE 2.5 MG PO TABS
2.5000 mg | ORAL_TABLET | Freq: Once | ORAL | Status: AC
  Administered 2023-04-09: 2.5 mg via ORAL
  Filled 2023-04-09: qty 1

## 2023-04-09 MED ORDER — SODIUM CHLORIDE 0.9% FLUSH
10.0000 mL | Freq: Two times a day (BID) | INTRAVENOUS | Status: DC
  Administered 2023-04-10 – 2023-04-11 (×2): 10 mL
  Administered 2023-04-11: 30 mL
  Administered 2023-04-12 (×2): 10 mL
  Administered 2023-04-13: 30 mL
  Administered 2023-04-13: 10 mL
  Administered 2023-04-14: 30 mL
  Administered 2023-04-14 – 2023-04-15 (×2): 10 mL
  Administered 2023-04-15: 20 mL
  Administered 2023-04-16 – 2023-04-18 (×4): 10 mL

## 2023-04-09 MED ORDER — POTASSIUM CHLORIDE CRYS ER 20 MEQ PO TBCR
40.0000 meq | EXTENDED_RELEASE_TABLET | Freq: Once | ORAL | Status: AC
  Administered 2023-04-09: 40 meq via ORAL
  Filled 2023-04-09: qty 2

## 2023-04-09 MED ORDER — SODIUM CHLORIDE 0.9% FLUSH: 10.0000 mL | INTRAVENOUS | Status: DC | PRN

## 2023-04-09 NOTE — Progress Notes (Signed)
At bedside to discuss PICC placement.  Per Order Dr Shirlee Latch orders to place PICC on left side.  RUE restricted due to infiltrate and pacer/ICD, noted to be edematous and bruised/discolored.  LUE also edematous mid upper arm distally to hand.  2 PIV's present with GBR.  Patient states Left forearm tender.  LFA also discolored/bruised.  Noted on Heparin infusion.  Wound vac noted on Left chest wall, pt and family states has had no  infection to the area, just fluid and woundvac.  States the wound vac is to be removed Tuesday.  Notified pt and family there was a possibility of increased chance of infection due to the wound vac there.  Barostim not vascular placement per Dr Shirlee Latch.  Pt and family also notified of alternatives for placement.  Agree to proceed with PICC placement in LUE.

## 2023-04-09 NOTE — Progress Notes (Signed)
ANTICOAGULATION CONSULT NOTE - Follow Up  Pharmacy Consult for heparin Indication: atrial fibrillation and mechanical mitral valve   No Known Allergies  Patient Measurements: Height: 6\' 1"  (185.4 cm) Weight: 92 kg (202 lb 13.2 oz) IBW/kg (Calculated) : 79.9 Heparin Dosing Weight: 94.3 kg   Vital Signs: BP: 91/60 (09/14 0300) Pulse Rate: 69 (09/14 0300)  Labs: Recent Labs    04/06/23 1026 04/06/23 1030 04/06/23 1430 04/06/23 2359 04/07/23 0247 04/07/23 0518 04/07/23 1116 04/07/23 2121 04/08/23 1024 04/09/23 0240  HGB  --    < >  --   --  9.5*  --   --   --  11.2* 9.9*  HCT  --    < >  --   --  31.3*  --   --   --  36.9* 32.5*  PLT  --    < >  --   --  159  --   --   --  178 153  LABPROT  --    < >  --   --   --   --  20.4*  --  22.1* 22.4*  INR  --    < >  --   --   --   --  1.7*  --  1.9* 1.9*  HEPARINUNFRC  --   --   --    < >  --    < >  --  0.40 0.36 <0.10*  CREATININE  --    < >  --   --  1.92*  --   --   --  2.32* 2.21*  TROPONINIHS 64*  --  62*  --   --   --   --   --   --   --    < > = values in this interval not displayed.    Estimated Creatinine Clearance: 30.1 mL/min (A) (by C-G formula based on SCr of 2.21 mg/dL (H)).   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   Conductive hearing loss    uses hearing aids   Dysrhythmia    A-fib   History of blood transfusion 03/24/2023   Hypothyroidism    Intraspinal abscess    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   Nonischemic cardiomyopathy (HCC)    Psychosexual dysfunction with inhibited sexual excitement    S/P mitral valve replacement    Syncope and collapse     Unspecified sleep apnea    last sleep study 11/07, uses cpap   Ventricular tachycardia (HCC)    VT (ventricular tachycardia) (HCC)     Medications:  Scheduled:   amoxicillin-clavulanate  1 tablet Oral Q12H   Chlorhexidine Gluconate Cloth  6 each Topical Daily   doxycycline  100 mg Oral Q12H   furosemide  80 mg Intravenous BID   levothyroxine  88 mcg Oral QAC breakfast   melatonin  5 mg Oral QHS   multivitamin with minerals  1 tablet Oral q AM   mupirocin ointment  1 Application Topical Daily   quiniDINE gluconate  324 mg Oral BID   sodium chloride flush  3 mL Intravenous Q12H    Assessment: 82  yom presenting after ICD shocks - on enoxaparin and warfarin PTA (LD 9/10 PM). PTA regimen 2.5 mg daily except 5 mg MWF. Currently undergoing bridge given recent admission for Barostim implant complicated with L chest wall hematoma requiring I&D - still requiring wound VAC changes.  INR today is 1.9 Hgb 9s stable PLTC> 150 stable. Trop 64. Scr 2.3  No over bleeding  - some oozing at hematoma sight  - now with VAC in place  Discussed with vascular - plan to hold warfarin and use heparin for now.   9/14 AM: heparin level returned at <0.1 on 1200 units/hr after two therapeutic levels. Per RN, no issues with the heparin running continuously or pauses. No signs/symptoms of bleeding with last CBC showing decrease in Hgb from 11.2 >> 9.9, and plts 178 >> 153  Goal of Therapy:  INR 3-3.5  - when warfarin not on hold Heparin level: 0.3-0.5 Monitor platelets by anticoagulation protocol: Yes   Plan:  Hold warfarin  Increase IV heparin infusion at 1350 units/hr 8h heparin level Monitor daily heparin level, CBC, and for s/sx of bleeding   Arabella Merles, PharmD. Clinical Pharmacist 04/09/2023 3:35 AM

## 2023-04-09 NOTE — Progress Notes (Signed)
Peripherally Inserted Central Catheter Placement  The IV Nurse has discussed with the patient and/or persons authorized to consent for the patient, the purpose of this procedure and the potential benefits and risks involved with this procedure.  The benefits include less needle sticks, lab draws from the catheter, and the patient may be discharged home with the catheter. Risks include, but not limited to, infection, bleeding, blood clot (thrombus formation), and puncture of an artery; nerve damage and irregular heartbeat and possibility to perform a PICC exchange if needed/ordered by physician.  Alternatives to this procedure were also discussed.  Bard Power PICC patient education guide, fact sheet on infection prevention and patient information card has been provided to patient /or left at bedside.    PICC Placement Documentation  PICC Triple Lumen 04/09/23 Left Basilic 48 cm 1 cm (Active)  Indication for Insertion or Continuance of Line Vasoactive infusions;Chronic illness with exacerbations (CF, Sickle Cell, etc.);Limited venous access - need for IV therapy >5 days (PICC only) 04/09/23 1649  Exposed Catheter (cm) 1 cm 04/09/23 1649  Site Assessment Clean, Dry, Intact 04/09/23 1649  Lumen #1 Status Flushed;Saline locked;Blood return noted 04/09/23 1649  Lumen #2 Status Flushed;Saline locked;Blood return noted 04/09/23 1649  Lumen #3 Status Flushed;Saline locked;Blood return noted 04/09/23 1649  Dressing Type Transparent;Securing device 04/09/23 1649  Dressing Status Antimicrobial disc in place;Clean, Dry, Intact 04/09/23 1649  Line Care Connections checked and tightened 04/09/23 1649  Line Adjustment (NICU/IV Team Only) No 04/09/23 1649  Dressing Intervention New dressing 04/09/23 1649  Dressing Change Due 04/16/23 04/09/23 1649       Elliot Dally 04/09/2023, 4:56 PM

## 2023-04-09 NOTE — Plan of Care (Signed)
  Problem: Education: Goal: Knowledge of General Education information will improve Description: Including pain rating scale, medication(s)/side effects and non-pharmacologic comfort measures Outcome: Progressing   Problem: Health Behavior/Discharge Planning: Goal: Ability to manage health-related needs will improve Outcome: Progressing   Problem: Clinical Measurements: Goal: Will remain free from infection Outcome: Progressing   Problem: Clinical Measurements: Goal: Respiratory complications will improve Outcome: Progressing   Problem: Activity: Goal: Risk for activity intolerance will decrease Outcome: Progressing   Problem: Nutrition: Goal: Adequate nutrition will be maintained Outcome: Progressing   Problem: Coping: Goal: Level of anxiety will decrease Outcome: Progressing   Problem: Elimination: Goal: Will not experience complications related to bowel motility Outcome: Progressing   Problem: Pain Managment: Goal: General experience of comfort will improve Outcome: Progressing   Problem: Safety: Goal: Ability to remain free from injury will improve Outcome: Progressing

## 2023-04-09 NOTE — Progress Notes (Addendum)
Patient ID: Austin [name], male   DOB: 1940/09/10, 82 y.o.   MRN: [id number]     Advanced Heart Failure Rounding Note  PCP-Cardiologist: None   Subjective:    Patient was paced out of slow VT on 9/13, back in slow VT overnight, this morning has converted back out of VT.  HR currently in 60s, has had both NSR with long 1st degree AVB and atrial tachycardia. He is now on amiodarone gtt 60 mg/hr + quinidine.   He diuresed well yesterday with Lasix 80 mg IV bid + metolazone 2.5 x 1. Creatinine 2.32 => 2.21.   No dyspnea at rest.  Does feel better out of VT.    Objective:   Weight Range: 91.6 kg Body mass index is 26.64 kg/m.   Vital Signs:   Temp:  [97.8 F (36.6 C)] 97.8 F (36.6 C) (09/13 1127) Pulse Rate:  [59-101] 62 (09/14 0800) Resp:  [13-25] 15 (09/14 0800) BP: (90-127)/(55-89) 98/59 (09/14 0800) SpO2:  [66 %-98 %] 94 % (09/14 0800) Weight:  [91.6 kg] 91.6 kg (09/14 0500) Last BM Date : 04/08/23  Weight change: Filed Weights   04/07/23 0500 04/08/23 0500 04/09/23 0500  Weight: 91.1 kg 92 kg 91.6 kg    Intake/Output:   Intake/Output Summary (Last 24 hours) at 04/09/2023 1016 Last data filed at 04/09/2023 0800 Gross per 24 hour  Intake 1032.22 ml  Output 2265 ml  Net -1232.78 ml      Physical Exam    General: NAD Neck: JVP 12=14 cm, no thyromegaly or thyroid nodule.  Lungs: Decreased at bases. CV: Nondisplaced PMI.  Heart regular S1/S2, no S3/S4, no murmur.  1+ edema to knees.   Abdomen: Soft, nontender, no hepatosplenomegaly, no distention.  Skin: Intact without lesions or rashes.  Neurologic: Alert and oriented x 3.  Psych: Normal affect. Extremities: No clubbing or cyanosis.  HEENT: Normal.   Telemetry   Slow VT low 100s => NSR with long 1st degree AVB and at times atrial tachycardia, rate 60.  He is RV pacing at times. Personally reviewed   EKG    No new EKG to review   Labs    CBC Recent Labs    04/06/23 1030 04/07/23 0247  04/08/23 1024 04/09/23 0240  WBC 8.6   < > 13.7* 11.0*  NEUTROABS 7.0  --   --   --   HGB 9.4*   < > 11.2* 9.9*  HCT 31.2*   < > 36.9* 32.5*  MCV 106.5*   < > 103.9* 100.3*  PLT 146*   < > 178 153   < > = values in this interval not displayed.   Basic Metabolic Panel Recent Labs    44/03/47 1024 04/09/23 0240  NA 135 133*  K 4.4 3.4*  CL 104 99  CO2 20* 24  GLUCOSE 151* 113*  BUN 33* 33*  CREATININE 2.32* 2.21*  CALCIUM 8.4* 8.1*  MG 2.4 2.1   Liver Function Tests Recent Labs    04/06/23 1030  AST 30  ALT 24  ALKPHOS 78  BILITOT 1.4*  PROT 5.9*  ALBUMIN 3.2*   No results for input(s): "LIPASE", "AMYLASE" in the last 72 hours. Cardiac Enzymes No results for input(s): "CKTOTAL", "CKMB", "CKMBINDEX", "TROPONINI" in the last 72 hours.  BNP: BNP (last 3 results) Recent Labs    02/03/23 1502 03/23/23 1045 04/06/23 1026  BNP 453.3* 352.5* 1,258.7*    ProBNP (last 3 results) Recent Labs    12/16/22  0959  PROBNP 3,692*     D-Dimer No results for input(s): "DDIMER" in the last 72 hours. Hemoglobin A1C No results for input(s): "HGBA1C" in the last 72 hours. Fasting Lipid Panel No results for input(s): "CHOL", "HDL", "LDLCALC", "TRIG", "CHOLHDL", "LDLDIRECT" in the last 72 hours. Thyroid Function Tests No results for input(s): "TSH", "T4TOTAL", "T3FREE", "THYROIDAB" in the last 72 hours.  Invalid input(s): "FREET3"   Other results:   Imaging    [place] EKG SITE RITE  Result Date: 04/09/2023 If Site Rite image not attached, placement could not be confirmed due to current cardiac rhythm.    Medications:     Scheduled Medications:  amoxicillin-clavulanate  1 tablet Oral Q12H   Chlorhexidine Gluconate Cloth  6 each Topical Daily   doxycycline  100 mg Oral Q12H   furosemide  80 mg Intravenous BID   levothyroxine  88 mcg Oral QAC breakfast   melatonin  5 mg Oral QHS   multivitamin with minerals  1 tablet Oral q AM   mupirocin ointment  1  Application Topical Daily   quiniDINE gluconate  324 mg Oral BID   sodium chloride flush  3 mL Intravenous Q12H    Infusions:  sodium chloride     amiodarone 60 mg/hr (04/09/23 0600)   heparin 1,350 Units/hr (04/09/23 0600)    PRN Medications: sodium chloride, acetaminophen, HYDROcodone-acetaminophen, nitroGLYCERIN, ondansetron (ZOFRAN) IV, polyethylene glycol, sodium chloride flush    Patient Profile   82 y/o male w/ 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    Now readmitted for ICD shocks for VT and a/c CHF.  Assessment/Plan   1. VT: History of VT and VT ablation in 2017.  MDT ICD.  Redo VT ablation in 1/23 (Dr. Marquette Saa).  He is on amiodarone, this was increased to 200 mg daily as outpatient with recurrent VT.  He has tolerated amiodarone poorly historically but is doing ok with it today.  In 7/23, slow VT was pace-terminated in the office, then he had an ICD shock for VT later in 7/23.  He was started on quinidine after this.  He is followed by Dr. Ladona Ridgel.  Now with 2 further ICD shocks for VT prior to this admission, had been in slow VT rate 100s-110s this admission.  Paced out of VT 9/13 by EP, went back into slow VT overnight.  Now has converted out of VT with alternating NSR with long 1st degree AVB as well as V-pacing with some atrial tachycardia noted.  - Continue amiodarone gtt 60 mg/hr for reload.  - Continue quinidine.  - Treatment of volume overload needed as well to keep out of VT.  2. Acute on chronic systolic CHF: Long-standing, suspected nonischemic cardiomyopathy.  Had RHC on 02/26/21 with normal filling pressures and mildly low cardiac output.  Echo in 4/23 showed  EF 20-25% with diffuse hypokinesis and  septal-lateral dyssynchrony, mildly decreased RV systolic function, mechanical MV with normal function, dilated IVC.  He has a [employer] CRT-D device present, but the LV lead is not functional.  He has complete heart block, so has been RV paced chronically with a wide QRS.  Has had difficulty tolerating GDMT historically with CKD and hypotension.  Currently off his home GDMT due to low BP and elevated creatinine.  Echo this admission with EF 20%, mechanical MV mean gradient 8 mmHg.  He diuresed will yesterday with IV Lasix and metolazone, but he still looks volume overloaded on exam today.  Creatinine 2.32 => 2.21.  - Place PICC to follow CVP and co-ox.  Can place in left arm, has barostimulator device but this is external to the circulation so should not affect PICC.  - Lasix 80 mg IV bid + metolazone 2.5 today.  - Spironolactone, valsartan, and Toprol XL on hold for now with low BP/elevated creatinine.  - Off digoxin due to persistently elevated level.  - He did not tolerate SGLT2 inhibitor in the past.  - The patient has CHB and has been chronically RV pacing as his LV lead is nonfunctional.  This is not ideal and creates dyssynchrony with a very wide QRS.  Left bundle lead placement discussed in past with Dr. Ladona Ridgel but thought risk would be too high and left bundle lead was not attempted.   - Barostimulator device placed but complicated by chest wall hematoma as below and is going to need to be removed.  3. Atrial fibrillation/flutter, atrial tachycardia: Paroxysmal.  He looks like he has had intermittent atrial tachycardia this admission.  - Continues on amiodarone.  4. Complete heart block: Patient is pacer dependent due to CHB.  - See above, upgrade to functional CRT thought to be too risky. 5. OSA: Severe. Continue CPAP 6. Mechanical mitral valve: Mean gradient higher on echo this admission at 8 mmHg.  - Heparin gtt and off warfarin for now.  - After barostimulator is out, restart warfarin  for INR goal 2.5-3.5.   7. Chest Wall Hematoma: post barostimulator complication, in setting of coumadin for mechanical MV.  s/p I&D for evacuation of hematoma and placement of wound vac + placement of antibiotic beads on 8/30.  - wound vac management per [name]  - Planning barostim removal next Tuesday.  - Warfarin on hold, on heparin gtt.  8. AKI on CKD stage 3: Creatinine baseline 1.5-2.  2.32 => 2.21 today.  - monitor w/ diuresis   CRITICAL CARE Performed by: Marca Ancona  Total critical care time: 40 minutes  Critical care time was exclusive of separately billable procedures and treating other patients.  Critical care was necessary to treat or prevent imminent or life-threatening deterioration.  Critical care was time spent personally by me on the following activities: development of treatment plan with patient and/or surrogate as well as nursing, discussions with consultants, evaluation of patient's response to treatment, examination of patient, obtaining history from patient or surrogate, ordering and performing treatments and interventions, ordering and review of laboratory studies, ordering and review of radiographic studies, pulse oximetry and re-evaluation of patient's condition.  Length of Stay: 3  Marca Ancona, MD  04/09/2023, 10:16 AM  Advanced Heart Failure Team Pager [phone] (M-F; 7a - 5p)  Please contact [facility] for night-coverage after hours (5p -7a ) and weekends on [email or web address]

## 2023-04-09 NOTE — Progress Notes (Addendum)
ANTICOAGULATION CONSULT NOTE - Follow Up  Pharmacy Consult for heparin Indication: atrial fibrillation and mechanical mitral valve   No Known Allergies  Patient Measurements: Height: 6\' 1"  (185.4 cm) Weight: 91.6 kg (201 lb 15.1 oz) IBW/kg (Calculated) : 79.9 Heparin Dosing Weight: 94.3 kg   Vital Signs: Temp: 97.8 F (36.6 C) (09/14 1215) Temp Source: Oral (09/14 1215) BP: 116/78 (09/14 1200) Pulse Rate: 63 (09/14 1200)  Labs: Recent Labs    04/06/23 1430 04/06/23 2359 04/07/23 0247 04/07/23 0518 04/07/23 1116 04/07/23 2121 04/08/23 1024 04/09/23 0240 04/09/23 1230  HGB  --    < > 9.5*  --   --   --  11.2* 9.9*  --   HCT  --   --  31.3*  --   --   --  36.9* 32.5*  --   PLT  --   --  159  --   --   --  178 153  --   LABPROT  --   --   --   --  20.4*  --  22.1* 22.4*  --   INR  --   --   --   --  1.7*  --  1.9* 1.9*  --   HEPARINUNFRC  --    < >  --    < >  --    < > 0.36 <0.10* <0.10*  CREATININE  --   --  1.92*  --   --   --  2.32* 2.21*  --   TROPONINIHS 62*  --   --   --   --   --   --   --   --    < > = values in this interval not displayed.    Estimated Creatinine Clearance: 30.1 mL/min (A) (by C-G formula based on SCr of 2.21 mg/dL (H)).   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   Conductive hearing loss    uses hearing aids   Dysrhythmia    A-fib   History of blood transfusion 03/24/2023   Hypothyroidism    Intraspinal abscess    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   Nonischemic cardiomyopathy (HCC)    Psychosexual dysfunction with inhibited sexual excitement    S/P mitral valve replacement    Syncope and  collapse    Unspecified sleep apnea    last sleep study 11/07, uses cpap   Ventricular tachycardia (HCC)    VT (ventricular tachycardia) (HCC)     Medications:  Scheduled:   amoxicillin-clavulanate  1 tablet Oral Q12H   Chlorhexidine Gluconate Cloth  6 each Topical Daily   doxycycline  100 mg Oral Q12H   furosemide  80 mg Intravenous BID   hyaluronidase Human  150 Units Subcutaneous Once   levothyroxine  88 mcg Oral QAC breakfast   melatonin  5 mg Oral QHS   multivitamin with minerals  1 tablet Oral q AM   mupirocin ointment  1 Application Topical Daily   quiniDINE gluconate  324 mg Oral BID   sodium chloride flush  3 mL Intravenous Q12H    Assessment: 82 yom presenting after ICD shocks - on  enoxaparin and warfarin PTA (LD 9/10 PM). PTA regimen 2.5 mg daily except 5 mg MWF. Currently undergoing bridge given recent admission for Barostim implant complicated with L chest wall hematoma requiring I&D - still requiring wound VAC changes.  INR today is 1.9 - off warfarin. Heparin level remains undetectable. Hgb 9.9, plt 153. No s/sx of bleeding - has wound VAC in place. Has restricted access - of note, did have extravasation on arm where heparin is infusing.    Goal of Therapy:  INR 3-3.5  - when warfarin not on hold Heparin level: 0.3-0.5 Monitor platelets by anticoagulation protocol: Yes   Plan:  Continue to hold warfarin  PICC line placed restart heparin 1200 uts/hr > previously therapeutic at this rate  Ensure heparin levels drawn from peripheral stick    Leota Sauers Pharm.D. CPP, BCPS Clinical Pharmacist [phone] 04/09/2023 6:48 PM   Please check AMION for all [facility] phone numbers After 10:00 PM, call Main Pharmacy [phone]

## 2023-04-10 DIAGNOSIS — I5023 Acute on chronic systolic (congestive) heart failure: Secondary | ICD-10-CM | POA: Diagnosis not present

## 2023-04-10 DIAGNOSIS — I472 Ventricular tachycardia, unspecified: Secondary | ICD-10-CM | POA: Diagnosis not present

## 2023-04-10 LAB — BASIC METABOLIC PANEL
Anion gap: 10 (ref 5–15)
BUN: 35 mg/dL — ABNORMAL HIGH (ref 8–23)
CO2: 26 mmol/L (ref 22–32)
Calcium: 8.2 mg/dL — ABNORMAL LOW (ref 8.9–10.3)
Chloride: 96 mmol/L — ABNORMAL LOW (ref 98–111)
Creatinine, Ser: 2.29 mg/dL — ABNORMAL HIGH (ref 0.61–1.24)
GFR, Estimated: 28 mL/min — ABNORMAL LOW (ref >60)
Glucose, Bld: 96 mg/dL (ref 70–99)
Potassium: 3.2 mmol/L — ABNORMAL LOW (ref 3.5–5.1)
Sodium: 132 mmol/L — ABNORMAL LOW (ref 135–145)

## 2023-04-10 LAB — RENAL FUNCTION PANEL
Albumin: 3 g/dL — ABNORMAL LOW (ref 3.5–5.0)
Anion gap: 11 (ref 5–15)
BUN: 35 mg/dL — ABNORMAL HIGH (ref 8–23)
CO2: 24 mmol/L (ref 22–32)
Calcium: 8 mg/dL — ABNORMAL LOW (ref 8.9–10.3)
Chloride: 95 mmol/L — ABNORMAL LOW (ref 98–111)
Creatinine, Ser: 2.28 mg/dL — ABNORMAL HIGH (ref 0.61–1.24)
GFR, Estimated: 28 mL/min — ABNORMAL LOW (ref >60)
Glucose, Bld: 224 mg/dL — ABNORMAL HIGH (ref 70–99)
Phosphorus: 4 mg/dL (ref 2.5–4.6)
Potassium: 4 mmol/L (ref 3.5–5.1)
Sodium: 130 mmol/L — ABNORMAL LOW (ref 135–145)

## 2023-04-10 LAB — CBC
HCT: 30.8 % — ABNORMAL LOW (ref 39.0–52.0)
Hemoglobin: 9.4 g/dL — ABNORMAL LOW (ref 13.0–17.0)
MCH: 30.8 pg (ref 26.0–34.0)
MCHC: 30.5 g/dL (ref 30.0–36.0)
MCV: 101 fL — ABNORMAL HIGH (ref 80.0–100.0)
Platelets: 128 10*3/uL — ABNORMAL LOW (ref 150–400)
RBC: 3.05 MIL/uL — ABNORMAL LOW (ref 4.22–5.81)
RDW: 19.6 % — ABNORMAL HIGH (ref 11.5–15.5)
WBC: 7.8 10*3/uL (ref 4.0–10.5)
nRBC: 0 % (ref 0.0–0.2)

## 2023-04-10 LAB — COOXEMETRY PANEL
Carboxyhemoglobin: 3 % — ABNORMAL HIGH (ref 0.5–1.5)
Carboxyhemoglobin: 2.2 % — ABNORMAL HIGH (ref 0.5–1.5)
Carboxyhemoglobin: 2.2 % — ABNORMAL HIGH (ref 0.5–1.5)
Methemoglobin: <0.7 % (ref 0.0–1.5)
Methemoglobin: 1.1 % (ref 0.0–1.5)
Methemoglobin: <0.7 % (ref 0.0–1.5)
O2 Saturation: 71 %
O2 Saturation: 52.7 %
O2 Saturation: 60.6 %
Total hemoglobin: 9.9 g/dL — ABNORMAL LOW (ref 12.0–16.0)
Total hemoglobin: 10.9 g/dL — ABNORMAL LOW (ref 12.0–16.0)
Total hemoglobin: 10.5 g/dL — ABNORMAL LOW (ref 12.0–16.0)

## 2023-04-10 LAB — HEPARIN LEVEL (UNFRACTIONATED)
Heparin Unfractionated: <0.1 [IU]/mL — ABNORMAL LOW (ref 0.30–0.70)
Heparin Unfractionated: 0.52 [IU]/mL (ref 0.30–0.70)

## 2023-04-10 LAB — PROTIME-INR
INR: 1.9 — ABNORMAL HIGH (ref 0.8–1.2)
Prothrombin Time: 22.3 s — ABNORMAL HIGH (ref 11.4–15.2)

## 2023-04-10 LAB — MAGNESIUM: Magnesium: 2 mg/dL (ref 1.7–2.4)

## 2023-04-10 MED ORDER — AMIODARONE LOAD VIA INFUSION
150.0000 mg | Freq: Once | INTRAVENOUS | Status: AC
  Administered 2023-04-10: 150 mg via INTRAVENOUS
  Filled 2023-04-10: qty 83.34

## 2023-04-10 MED ORDER — POTASSIUM CHLORIDE CRYS ER 20 MEQ PO TBCR
40.0000 meq | EXTENDED_RELEASE_TABLET | Freq: Once | ORAL | Status: AC
  Administered 2023-04-10: 40 meq via ORAL
  Filled 2023-04-10: qty 2

## 2023-04-10 MED ORDER — METOLAZONE 2.5 MG PO TABS
2.5000 mg | ORAL_TABLET | Freq: Once | ORAL | Status: AC
  Administered 2023-04-10: 2.5 mg via ORAL
  Filled 2023-04-10: qty 1

## 2023-04-10 MED ORDER — FUROSEMIDE 10 MG/ML IJ SOLN
12.0000 mg/h | INTRAVENOUS | Status: DC
  Administered 2023-04-10 – 2023-04-13 (×6): 12 mg/h via INTRAVENOUS
  Filled 2023-04-10 (×8): qty 20

## 2023-04-10 NOTE — Progress Notes (Signed)
Orthopedic Tech Progress Note Patient Details:  Austin [name] [birth year] [id number]  Ortho Devices Type of Ortho Device: Roland Rack boot Ortho Device/Splint Location: BLE Ortho Device/Splint Interventions: Ordered, Application   Post Interventions Patient Tolerated: Well Instructions Provided: Care of device  Bettyjean Stefanski Carmine Savoy 04/10/2023, 2:48 PM

## 2023-04-10 NOTE — Progress Notes (Signed)
ANTICOAGULATION CONSULT NOTE - Follow Up  Pharmacy Consult for heparin Indication: atrial fibrillation and mechanical mitral valve   No Known Allergies  Patient Measurements: Height: 6\' 1"  (185.4 cm) Weight: 92.2 kg (203 lb 4.2 oz) IBW/kg (Calculated) : 79.9 Heparin Dosing Weight: 94.3 kg   Vital Signs: BP: 100/74 (09/15 1209) Pulse Rate: 103 (09/15 1245)  Labs: Recent Labs    04/08/23 1024 04/09/23 0240 04/09/23 1230 04/10/23 0441 04/10/23 1405 04/10/23 1425  HGB 11.2* 9.9*  --  9.4*  --   --   HCT 36.9* 32.5*  --  30.8*  --   --   PLT 178 153  --  128*  --   --   LABPROT 22.1* 22.4*  --  22.3*  --   --   INR 1.9* 1.9*  --  1.9*  --   --   HEPARINUNFRC 0.36 <0.10* <0.10* <0.10*  --  0.52  CREATININE 2.32* 2.21*  --  2.29* 2.28*  --     Estimated Creatinine Clearance: 29.2 mL/min (A) (by C-G formula based on SCr of 2.28 mg/dL (H)).   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   Conductive hearing loss    uses hearing aids   Dysrhythmia    A-fib   History of blood transfusion 03/24/2023   Hypothyroidism    Intraspinal abscess    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   Nonischemic cardiomyopathy (HCC)    Psychosexual dysfunction with inhibited sexual excitement    S/P mitral valve replacement    Syncope and collapse    Unspecified sleep apnea    last sleep study 11/07, uses cpap   Ventricular tachycardia (HCC)    VT (ventricular tachycardia) (HCC)     Medications:  Scheduled:   amoxicillin-clavulanate  1 tablet Oral Q12H   Chlorhexidine Gluconate Cloth  6 each Topical Daily   doxycycline  100 mg Oral Q12H    levothyroxine  88 mcg Oral QAC breakfast   melatonin  5 mg Oral QHS   multivitamin with minerals  1 tablet Oral q AM   mupirocin ointment  1 Application Topical Daily   quiniDINE gluconate  324 mg Oral BID   sodium chloride flush  10-40 mL Intracatheter Q12H   sodium chloride flush  3 mL Intravenous Q12H    Assessment: 82 yom presenting after ICD shocks - on enoxaparin and warfarin PTA (LD 9/10 PM). PTA regimen 2.5 mg daily except 5 mg MWF. Currently undergoing bridge given recent admission for Barostim implant complicated with L chest wall hematoma requiring I&D - still requiring wound VAC changes.  INR remains 1.9 - off warfarin. Heparin level now slightly supratherapeutic at 0.52, on 1350 units/hr. Hgb 9.4, plt 128. No s/sx of bleeding. Tried for peripheral stick x2 - was unsuccessful so heparin was then paused for 20 minutes and drawn from PICC.   Goal of Therapy:  INR 3-3.5  - when warfarin not on hold Heparin level: 0.3-0.5 Monitor platelets by anticoagulation protocol: Yes   Plan:  Continue to hold warfarin  Reduce heparin infusion to 1250 units/hr  Order heparin level in 8 hours  Monitor daily HL, CBC, for  s/sx of bleeding   Thank you for allowing pharmacy to participate in this patient's care,  Sherron Monday, PharmD, BCCCP Clinical Pharmacist  Phone: [phone] 04/10/2023 3:23 PM  Please check AMION for all [facility] phone numbers After 10:00 PM, call Main Pharmacy [phone]

## 2023-04-10 NOTE — Progress Notes (Addendum)
Patient ID: Austin [name], male   DOB: Feb 20, 1941, 82 y.o.   MRN: [id number]     Advanced Heart Failure Rounding Note  PCP-Cardiologist: None   Subjective:    Patient was paced out of slow VT on 9/13.  Since then, has been in and out of slow VT.  Was out of VT overnight, now back in slow VT for a couple of hours rate around 100.  When out of VT, he has had atrial tachycardia with intermittent RV pacing. He is now on amiodarone gtt 60 mg/hr + quinidine.   I/Os -577 yesterday on IV Lasix and metolazone. Creatinine 2.32 => 2.21 => 2.29.  CVP 15-16 today, co-ox 71% (was sent prior to VT restarting).    No dyspnea at rest.  Does feel better out of VT.    Objective:   Weight Range: 92.2 kg Body mass index is 26.82 kg/m.   Vital Signs:   Temp:  [97.8 F (36.6 C)] 97.8 F (36.6 C) (09/14 1215) Pulse Rate:  [60-104] 100 (09/15 0900) Resp:  [9-27] 15 (09/15 0900) BP: (98-130)/(52-95) 100/75 (09/15 0900) SpO2:  [90 %-99 %] 95 % (09/15 0900) Weight:  [92.2 kg] 92.2 kg (09/15 0600) Last BM Date : 04/09/23  Weight change: Filed Weights   04/08/23 0500 04/09/23 0500 04/10/23 0600  Weight: 92 kg 91.6 kg 92.2 kg    Intake/Output:   Intake/Output Summary (Last 24 hours) at 04/10/2023 1007 Last data filed at 04/10/2023 0900 Gross per 24 hour  Intake 1601.34 ml  Output 2165 ml  Net -563.66 ml      Physical Exam    General: NAD Neck: JVP about 16 cm, no thyromegaly or thyroid nodule.  Lungs: Clear to auscultation bilaterally with normal respiratory effort. CV: Nondisplaced PMI.  Heart regular S1/S2, no S3/S4, no murmur.  2+ edema to knees.   Abdomen: Soft, nontender, no hepatosplenomegaly, no distention.  Skin: Intact without lesions or rashes.  Neurologic: Alert and oriented x 3.  Psych: Normal affect. Extremities: No clubbing or cyanosis.  HEENT: Normal.   Telemetry   Slow VT low 100s for about 2 hrs, prior to that atrial tachycardia with RV pacing at times.  Personally reviewed   EKG    No new EKG to review   Labs    CBC Recent Labs    04/09/23 0240 04/10/23 0441  WBC 11.0* 7.8  HGB 9.9* 9.4*  HCT 32.5* 30.8*  MCV 100.3* 101.0*  PLT 153 128*   Basic Metabolic Panel Recent Labs    53/66/44 0240 04/10/23 0441  NA 133* 132*  K 3.4* 3.2*  CL 99 96*  CO2 24 26  GLUCOSE 113* 96  BUN 33* 35*  CREATININE 2.21* 2.29*  CALCIUM 8.1* 8.2*  MG 2.1 2.0   Liver Function Tests No results for input(s): "AST", "ALT", "ALKPHOS", "BILITOT", "PROT", "ALBUMIN" in the last 72 hours.  No results for input(s): "LIPASE", "AMYLASE" in the last 72 hours. Cardiac Enzymes No results for input(s): "CKTOTAL", "CKMB", "CKMBINDEX", "TROPONINI" in the last 72 hours.  BNP: BNP (last 3 results) Recent Labs    02/03/23 1502 03/23/23 1045 04/06/23 1026  BNP 453.3* 352.5* 1,258.7*    ProBNP (last 3 results) Recent Labs    12/16/22 0959  PROBNP 3,692*     D-Dimer No results for input(s): "DDIMER" in the last 72 hours. Hemoglobin A1C No results for input(s): "HGBA1C" in the last 72 hours. Fasting Lipid Panel No results for input(s): "CHOL", "HDL", "LDLCALC", "TRIG", "  CHOLHDL", "LDLDIRECT" in the last 72 hours. Thyroid Function Tests No results for input(s): "TSH", "T4TOTAL", "T3FREE", "THYROIDAB" in the last 72 hours.  Invalid input(s): "FREET3"   Other results:   Imaging    DG Chest Port 1 View  Result Date: 04/09/2023 CLINICAL DATA:  PICC line placement EXAM: PORTABLE CHEST 1 VIEW COMPARISON:  04/06/2023 FINDINGS: Single frontal view of the chest demonstrates interval placement of a left-sided PICC, tip overlying superior vena cava. 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 Stable multi lead AICD and left-sided nerve stimulator. Cardiac silhouette is unchanged. Continued central vascular congestion, with patchy bibasilar consolidation right greater than left increased since prior study. No effusion or  pneumothorax. No acute bony abnormalities. IMPRESSION: 1. Left-sided PICC as above. 2. Continued central vascular congestion, with progressive bibasilar consolidation favoring atelectasis. Electronically Signed   By: Sharlet Salina M.D.   On: 04/09/2023 17:49   [place] EKG SITE RITE  Result Date: 04/09/2023 If Site Rite image not attached, placement could not be confirmed due to current cardiac rhythm.    Medications:     Scheduled Medications:  amiodarone  150 mg Intravenous Once   amoxicillin-clavulanate  1 tablet Oral Q12H   Chlorhexidine Gluconate Cloth  6 each Topical Daily   doxycycline  100 mg Oral Q12H   levothyroxine  88 mcg Oral QAC breakfast   melatonin  5 mg Oral QHS   metolazone  2.5 mg Oral Once   multivitamin with minerals  1 tablet Oral q AM   mupirocin ointment  1 Application Topical Daily   potassium chloride  40 mEq Oral Once   quiniDINE gluconate  324 mg Oral BID   sodium chloride flush  10-40 mL Intracatheter Q12H   sodium chloride flush  3 mL Intravenous Q12H    Infusions:  sodium chloride     amiodarone 60 mg/hr (04/10/23 0900)   furosemide (LASIX) 200 mg in dextrose 5 % 100 mL (2 mg/mL) infusion     heparin 1,350 Units/hr (04/10/23 0900)    PRN Medications: sodium chloride, acetaminophen, HYDROcodone-acetaminophen, nitroGLYCERIN, ondansetron (ZOFRAN) IV, polyethylene glycol, sodium chloride flush, sodium chloride flush    Patient Profile   82 y/o male w/ 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    Now readmitted for ICD shocks for VT and a/c CHF.  Assessment/Plan   1. VT: History of VT and VT ablation in 2017.  MDT ICD.  Redo VT ablation in 1/23 (Dr. Marquette Saa).  He is on amiodarone, this was  increased to 200 mg daily as outpatient with recurrent VT.  He has tolerated amiodarone poorly historically but is doing ok with it today.  In 7/23, slow VT was pace-terminated in the office, then he had an ICD shock for VT later in 7/23.  He was started on quinidine after this.  He is followed by Dr. Ladona Ridgel.  Now with 2 further ICD shocks for VT prior to this admission, had been in slow VT rate 100s-110s this admission.  Paced out of VT 9/13 by EP, since then has been in and out of slow VT.  When out of VT, he has been in atrial tachycardia with periodic v-pacing.  For about 2 hrs this morning, back in slow VT.  - Continue amiodarone  gtt 60 mg/hr and will bolus 150 mg IV amiodarone now.  - Continue quinidine.  - Replace K - Treatment of volume overload needed as well to keep out of VT.  -  Discussed with Dr. Graciela Husbands, will send repeat co-ox now.  If low, Dr. Graciela Husbands to try to pace him out of VT today.  If not low, will continue amiodarone and bolus to see if he self-converts again.  2. Acute on chronic systolic CHF: Long-standing, suspected nonischemic cardiomyopathy.  Had RHC on 02/26/21 with normal filling pressures and mildly low cardiac output.  Echo in 4/23 showed  EF 20-25% with diffuse hypokinesis and septal-lateral dyssynchrony, mildly decreased RV systolic function, mechanical MV with normal function, dilated IVC.  He has a [employer] CRT-D device present, but the LV lead is not functional.  He has complete heart block, so has been RV paced chronically with a wide QRS.  Has had difficulty tolerating GDMT historically with CKD and hypotension.  Currently off his home GDMT due to low BP and elevated creatinine.  Echo this admission with EF 20%, mechanical MV mean gradient 8 mmHg.  He diuresed some yesterday with IV Lasix and metolazone but not vigorously.  He still looks volume overloaded on exam today with CVP 15-16.  Creatinine 2.32 => 2.21 => 2.29.  Co-ox 71% prior to slow VT restarting.  - Repeat co-ox as  above now that he is back in VT (Dr. Graciela Husbands to pace out if it has dropped) => co-ox down to 52.7% while back in slow VT, spoke with Dr. Graciela Husbands and will try to pace out today.  - Lasix 80 mg IV x 1 then Lasix gtt 12 mg/hr with metolazone 2.5 today.  - Unna boots.  - Spironolactone, valsartan, and Toprol XL on hold for now with low BP/elevated creatinine.  - Off digoxin due to persistently elevated level.  - He did not tolerate SGLT2 inhibitor in the past.  - The patient has CHB and has been chronically RV pacing as his LV lead is nonfunctional.  This is not ideal and creates dyssynchrony with a very wide QRS.  Left bundle lead placement discussed in past with Dr. Ladona Ridgel but thought risk would be too high and left bundle lead was not attempted.   - Barostimulator device placed but complicated by chest wall hematoma as below and is going to need to be removed.  3. Atrial fibrillation/flutter, atrial tachycardia: Paroxysmal.  This admission, when out of VT, he is in atrial tachycardia.  - Continues on amiodarone.  - Consider DCCV this admission to get him out of AT and back in NSR.  4. Complete heart block: Patient is pacer dependent due to CHB.  - See above, upgrade to functional CRT thought to be too risky due to need to extract old CS lead. 5. OSA: Severe. Continue CPAP 6. Mechanical mitral valve: Mean gradient higher on echo this admission at 8 mmHg.  - Heparin gtt and off warfarin for now.  - After barostimulator is out, restart warfarin for INR goal 2.5-3.5.   7. Chest Wall Hematoma: post barostimulator complication, in setting of coumadin for mechanical MV.  s/p I&D for evacuation of hematoma and placement of wound vac + placement of antibiotic beads on 8/30.  - wound vac management per [name]  - Planning barostim removal Tuesday.  - Warfarin on hold, on heparin gtt.  8. AKI on CKD stage 3: Creatinine baseline 1.5-2.  2.32 => 2.21 => 2.29 today.  - monitor  w/ diuresis   CRITICAL  CARE Performed by: Marca Ancona  Total critical care time: 40 minutes  Critical care time was exclusive of separately billable procedures and treating other patients.  Critical care was necessary to treat or prevent imminent or life-threatening deterioration.  Critical care was time spent personally by me on the following activities: development of treatment plan with patient and/or surrogate as well as nursing, discussions with consultants, evaluation of patient's response to treatment, examination of patient, obtaining history from patient or surrogate, ordering and performing treatments and interventions, ordering and review of laboratory studies, ordering and review of radiographic studies, pulse oximetry and re-evaluation of patient's condition.  Length of Stay: 4  Marca Ancona, MD  04/10/2023, 10:07 AM  Advanced Heart Failure Team Pager [phone] (M-F; 7a - 5p)  Please contact [facility] for night-coverage after hours (5p -7a ) and weekends on [email or web address]

## 2023-04-10 NOTE — Progress Notes (Signed)
ANTICOAGULATION CONSULT NOTE - Follow Up  Pharmacy Consult for heparin Indication: atrial fibrillation and mechanical mitral valve   No Known Allergies  Patient Measurements: Height: 6\' 1"  (185.4 cm) Weight: 91.6 kg (201 lb 15.1 oz) IBW/kg (Calculated) : 79.9 Heparin Dosing Weight: 94.3 kg   Vital Signs: BP: 112/77 (09/15 0500) Pulse Rate: 72 (09/15 0500)  Labs: Recent Labs    04/08/23 1024 04/09/23 0240 04/09/23 1230 04/10/23 0441  HGB 11.2* 9.9*  --  9.4*  HCT 36.9* 32.5*  --  30.8*  PLT 178 153  --  128*  LABPROT 22.1* 22.4*  --  22.3*  INR 1.9* 1.9*  --  1.9*  HEPARINUNFRC 0.36 <0.10* <0.10* <0.10*  CREATININE 2.32* 2.21*  --  2.29*    Estimated Creatinine Clearance: 29.1 mL/min (A) (by C-G formula based on SCr of 2.29 mg/dL (H)).   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   Conductive hearing loss    uses hearing aids   Dysrhythmia    A-fib   History of blood transfusion 03/24/2023   Hypothyroidism    Intraspinal abscess    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   Nonischemic cardiomyopathy (HCC)    Psychosexual dysfunction with inhibited sexual excitement    S/P mitral valve replacement    Syncope and collapse    Unspecified sleep apnea    last sleep study 11/07, uses cpap   Ventricular tachycardia (HCC)    VT (ventricular tachycardia) (HCC)     Medications:  Scheduled:   amoxicillin-clavulanate  1 tablet Oral Q12H   Chlorhexidine Gluconate Cloth  6 each Topical Daily   doxycycline  100 mg Oral Q12H   furosemide  80 mg Intravenous BID   levothyroxine  88 mcg Oral QAC breakfast   melatonin  5 mg Oral QHS    multivitamin with minerals  1 tablet Oral q AM   mupirocin ointment  1 Application Topical Daily   quiniDINE gluconate  324 mg Oral BID   sodium chloride flush  10-40 mL Intracatheter Q12H   sodium chloride flush  3 mL Intravenous Q12H    Assessment: 82 yom presenting after ICD shocks - on enoxaparin and warfarin PTA (LD 9/10 PM). PTA regimen 2.5 mg daily except 5 mg MWF. Currently undergoing bridge given recent admission for Barostim implant complicated with L chest wall hematoma requiring I&D - still requiring wound VAC changes.  INR today is 1.9 - off warfarin. Heparin level remains undetectable. No s/sx of bleeding - has wound VAC in place. Has restricted access - of note, did have extravasation on arm where heparin is infusing.    9/15 AM: heparin level returned at <0.1 on 1200 units/hr. Per RN, heparin has been infusing continuously. RN made aware that lab was drawn from the PICC line after heparin was paused for 5 minutes and line flushed, not drawn via peripheral stick. CBC shows Hgb low, stable and plts 153 >128.  Goal of Therapy:  INR 3-3.5  - when warfarin not on hold Heparin level: 0.3-0.5 Monitor platelets by anticoagulation protocol: Yes   Plan:  Continue to hold warfarin  Increase heparin to 1350 units/hr 8h heparin  level Daily CBC and heparin level Ensure heparin levels drawn from peripheral stick (RN aware for future labs)  Arabella Merles, PharmD. Clinical Pharmacist 04/10/2023 5:49 AM

## 2023-04-10 NOTE — Progress Notes (Signed)
Asked to see patient for slow VT with a change in COOX from 70's to 50's\  APAce termination scuccessful at 12 bursts at 450 msec But w freq couplets and VTNS following  Have programmed ( RVlead only) to 100 ( discussed with DM) and will reassess coox after about 2 hrs-- not sure the hemodynamic benefits of RV pacing vs VT)  may need to reprogram the rate lower, but ... VT will not be far behind.    This is a very difficult situation and may be appropriate to consider transfer to a center where high risk VT ablation is doable.  I dont see an alternative unless ( finally) the amiodarone is able to slow his VT into extinction  Will keep him on 60 mg/hr

## 2023-04-11 ENCOUNTER — Encounter: Payer: Self-pay | Admitting: *Deleted

## 2023-04-11 DIAGNOSIS — I5023 Acute on chronic systolic (congestive) heart failure: Secondary | ICD-10-CM | POA: Diagnosis not present

## 2023-04-11 DIAGNOSIS — I472 Ventricular tachycardia, unspecified: Secondary | ICD-10-CM | POA: Diagnosis not present

## 2023-04-11 LAB — PROTIME-INR
INR: 1.6 — ABNORMAL HIGH (ref 0.8–1.2)
Prothrombin Time: 19.2 seconds — ABNORMAL HIGH (ref 11.4–15.2)

## 2023-04-11 LAB — HEPARIN LEVEL (UNFRACTIONATED)
Heparin Unfractionated: 0.13 [IU]/mL — ABNORMAL LOW (ref 0.30–0.70)
Heparin Unfractionated: 0.31 [IU]/mL (ref 0.30–0.70)

## 2023-04-11 LAB — CULTURE, BLOOD (ROUTINE X 2)
Culture: NO GROWTH
Special Requests: ADEQUATE

## 2023-04-11 LAB — CBC
HCT: 30.9 % — ABNORMAL LOW (ref 39.0–52.0)
Hemoglobin: 9.6 g/dL — ABNORMAL LOW (ref 13.0–17.0)
MCH: 31.4 pg (ref 26.0–34.0)
MCHC: 31.1 g/dL (ref 30.0–36.0)
MCV: 101 fL — ABNORMAL HIGH (ref 80.0–100.0)
Platelets: 125 10*3/uL — ABNORMAL LOW (ref 150–400)
RBC: 3.06 MIL/uL — ABNORMAL LOW (ref 4.22–5.81)
RDW: 19.7 % — ABNORMAL HIGH (ref 11.5–15.5)
WBC: 6.4 10*3/uL (ref 4.0–10.5)
nRBC: 0 % (ref 0.0–0.2)

## 2023-04-11 LAB — BASIC METABOLIC PANEL
Anion gap: 10 (ref 5–15)
BUN: 35 mg/dL — ABNORMAL HIGH (ref 8–23)
CO2: 28 mmol/L (ref 22–32)
Calcium: 8.3 mg/dL — ABNORMAL LOW (ref 8.9–10.3)
Chloride: 96 mmol/L — ABNORMAL LOW (ref 98–111)
Creatinine, Ser: 2.7 mg/dL — ABNORMAL HIGH (ref 0.61–1.24)
GFR, Estimated: 23 mL/min — ABNORMAL LOW (ref >60)
Glucose, Bld: 132 mg/dL — ABNORMAL HIGH (ref 70–99)
Potassium: 3.7 mmol/L (ref 3.5–5.1)
Sodium: 134 mmol/L — ABNORMAL LOW (ref 135–145)

## 2023-04-11 LAB — CULTURE, BLOOD (SINGLE)
Culture: NO GROWTH
Special Requests: ADEQUATE

## 2023-04-11 LAB — COOXEMETRY PANEL
Carboxyhemoglobin: 2.8 % — ABNORMAL HIGH (ref 0.5–1.5)
Methemoglobin: <0.7 % (ref 0.0–1.5)
O2 Saturation: 64.2 %
Total hemoglobin: 10 g/dL — ABNORMAL LOW (ref 12.0–16.0)

## 2023-04-11 LAB — MAGNESIUM: Magnesium: 1.9 mg/dL (ref 1.7–2.4)

## 2023-04-11 MED ORDER — POTASSIUM CHLORIDE CRYS ER 20 MEQ PO TBCR
40.0000 meq | EXTENDED_RELEASE_TABLET | Freq: Two times a day (BID) | ORAL | Status: AC
  Administered 2023-04-11 (×2): 40 meq via ORAL
  Filled 2023-04-11 (×2): qty 2

## 2023-04-11 MED ORDER — AMOXICILLIN-POT CLAVULANATE 500-125 MG PO TABS
1.0000 | ORAL_TABLET | Freq: Two times a day (BID) | ORAL | Status: DC
  Administered 2023-04-11 – 2023-04-15 (×9): 1 via ORAL
  Filled 2023-04-11 (×10): qty 1

## 2023-04-11 MED ORDER — MAGNESIUM SULFATE 2 GM/50ML IV SOLN
2.0000 g | Freq: Once | INTRAVENOUS | Status: AC
  Administered 2023-04-11: 2 g via INTRAVENOUS
  Filled 2023-04-11: qty 50

## 2023-04-11 NOTE — Progress Notes (Incomplete Revision)
Rounding Note    Patient Name: Austin [name] Date of Encounter: 04/11/2023  [facility] Cardiologist: Dr. Shirlee Latch EP: Dr. Ladona Ridgel  Subjective   OOB to chair, tired, no dizzy or lightheaded, no CP  Inpatient Medications    Scheduled Meds:  amoxicillin-clavulanate  1 tablet Oral Q12H   Chlorhexidine Gluconate Cloth  6 each Topical Daily   doxycycline  100 mg Oral Q12H   levothyroxine  88 mcg Oral QAC breakfast   melatonin  5 mg Oral QHS   multivitamin with minerals  1 tablet Oral q AM   mupirocin ointment  1 Application Topical Daily   quiniDINE gluconate  324 mg Oral BID   sodium chloride flush  10-40 mL Intracatheter Q12H   sodium chloride flush  3 mL Intravenous Q12H   Continuous Infusions:  sodium chloride     amiodarone 60 mg/hr (04/11/23 0600)   furosemide (LASIX) 200 mg in dextrose 5 % 100 mL (2 mg/mL) infusion 12 mg/hr (04/11/23 0600)   heparin 1,300 Units/hr (04/11/23 0600)   PRN Meds: sodium chloride, acetaminophen, HYDROcodone-acetaminophen, nitroGLYCERIN, ondansetron (ZOFRAN) IV, polyethylene glycol, sodium chloride flush, sodium chloride flush   Vital Signs    Vitals:   04/11/23 0300 04/11/23 0400 04/11/23 0500 04/11/23 0600  BP: 97/78 96/72 97/72  98/72  Pulse: 100 100 (!) 101 100  Resp: 17 20 (!) 24 13  Temp:      TempSrc:      SpO2: 96% 96% 93% 91%  Weight:   91.2 kg   Height:        Intake/Output Summary (Last 24 hours) at 04/11/2023 0731 Last data filed at 04/11/2023 0630 Gross per 24 hour  Intake 1670.81 ml  Output 3320 ml  Net -1649.19 ml      04/11/2023    5:00 AM 04/10/2023    6:00 AM 04/09/2023    5:00 AM  Last 3 Weights  Weight (lbs) 201 lb 1 oz 203 lb 4.2 oz 201 lb 15.1 oz  Weight (kg) 91.2 kg 92.2 kg 91.6 kg      Telemetry    VP 100 - Personally Reviewed  ECG    No new EKGs - Personally Reviewed  Physical Exam   GEN: No acute distress.   Neck: No JVD Cardiac: RRR, valve appreciated, rubs, or gallops.   Respiratory: crackles at the bases b/l. GI: Soft, nontender, non-distended  MS: marked edema remains; No deformity. Neuro:  Nonfocal  Psych: Normal affect   Labs    High Sensitivity Troponin:   Recent Labs  Lab 04/06/23 1026 04/06/23 1430  TROPONINIHS 64* 62*     Chemistry Recent Labs  Lab 04/06/23 1030 04/07/23 0247 04/09/23 0240 04/10/23 0441 04/10/23 1405 04/11/23 0434  NA 138   < > 133* 132* 130* 134*  K 3.6   < > 3.4* 3.2* 4.0 3.7  CL 106   < > 99 96* 95* 96*  CO2 20*   < > 24 26 24 28   GLUCOSE 97   < > 113* 96 224* 132*  BUN 28*   < > 33* 35* 35* 35*  CREATININE 1.90*   < > 2.21* 2.29* 2.28* 2.70*  CALCIUM 8.3*   < > 8.1* 8.2* 8.0* 8.3*  MG 1.9   < > 2.1 2.0  --  1.9  PROT 5.9*  --   --   --   --   --   ALBUMIN 3.2*  --   --   --  3.0*  --   AST 30  --   --   --   --   --   ALT 24  --   --   --   --   --   ALKPHOS 78  --   --   --   --   --   BILITOT 1.4*  --   --   --   --   --   GFRNONAA 35*   < > 29* 28* 28* 23*  ANIONGAP 12   < > 10 10 11 10    < > = values in this interval not displayed.    Lipids No results for input(s): "CHOL", "TRIG", "HDL", "LABVLDL", "LDLCALC", "CHOLHDL" in the last 168 hours.  Hematology Recent Labs  Lab 04/09/23 0240 04/10/23 0441 04/11/23 0434  WBC 11.0* 7.8 6.4  RBC 3.24* 3.05* 3.06*  HGB 9.9* 9.4* 9.6*  HCT 32.5* 30.8* 30.9*  MCV 100.3* 101.0* 101.0*  MCH 30.6 30.8 31.4  MCHC 30.5 30.5 31.1  RDW 19.9* 19.6* 19.7*  PLT 153 128* 125*   Thyroid  Recent Labs  Lab 04/06/23 1006 04/06/23 1030  TSH 18.440*  --   FREET4  --  1.33*    BNP Recent Labs  Lab 04/06/23 1026  BNP 1,258.7*    DDimer No results for input(s): "DDIMER" in the last 168 hours.   Radiology    DG Chest Port 1 View Result Date: 04/09/2023 CLINICAL DATA:  PICC line placement EXAM: PORTABLE CHEST 1 VIEW COMPARISON:  04/06/2023 FINDINGS: Single frontal view of the chest demonstrates interval placement of a left-sided PICC, tip overlying  superior vena cava. 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 Stable multi lead AICD and left-sided nerve stimulator. Cardiac silhouette is unchanged. Continued central vascular congestion, with patchy bibasilar consolidation right greater than left increased since prior study. No effusion or pneumothorax. No acute bony abnormalities. IMPRESSION: 1. Left-sided PICC as above. 2. Continued central vascular congestion, with progressive bibasilar consolidation favoring atelectasis. Electronically Signed   By: Sharlet Salina M.D.   On: 04/09/2023 17:49      Cardiac Studies    04/07/23: limited echo  1. Left ventricular ejection fraction, by estimation, is 20%. The left  ventricle has severely decreased function. The left ventricle demonstrates  global hypokinesis. No LV thrombus noted. Indeterminant diastolic  function.   2. Peak RV-RA gradient 19 mmHg. IVC not visualized. Right ventricular  systolic function is mildly reduced. The right ventricular size is normal.   3. Mechanical mitral valve with mean gradient 8 mmHg, increased from  prior. No mitral regurgitation noted though shadowing from prosthetic  valve makes this difficult to discern.   4. Tricuspid valve regurgitation is moderate.   5. The aortic valve is tricuspid. There is moderate calcification of the  aortic valve. Doppler evaluation of aortic valve not done.   6. Limited echo.   Patient Profile     82 y.o. male w/PMHx of  VT (ablation 2017), AFib, CHB, chronic CHF (systolic), VHD s/p MVR (mechanical, 2000), hypothyroidism (s/p thyroidectomy), NICM   Underwent barostim implant 03/17/23 >> complicated by chest wall hematoma >> I&D (x2)  > wound vac  Admitted with VT    Device information MDT CRT d ICD implanted 08/05/2011, gen change 05/04/2017 (has an LV lead in, abandoned  2/2 high thresholds >with a dual chamber device in) + hx of VT Nov 2021, VT 160's required external shock  VT ablation  09/15/2015  AAD Amiodarone goes back to 2010  Assessment & Plan    VT with ICD shock Perhaps provoked by volume OL Recurrent slow VTs here have been pace terminated as well as some spontaneously converted events Poorly tolerated with change in his hemodynamics with it Home quinidine Amiodarone gtt remains at 60 Pace terminated by Dr. Graciela Husbands yesterday >> VP at 100 without recurrent VT so far  RV paces a lot (CHB history) 89%   Acute on chronic systolic CHF EF 20-25% on Echo 09/28/2022 AHF team is on board Cumulatively neg -[id number] Creat climbing 2.70 (baseline looks probably 1.8-2.0) Remains volume OL Picc now with b/l IV infiltrations    Barostim implantation 8/22 Complicated by hematoma and poorly healing site that has continued to drain. Vasc team on board >> removal tomorrow planned   Atrial flutter Longstanding persistent Afib Left sided flutter Managed on coumadin for CHA2DS2VASc of at least 4.  V pacing currently, by notes > ATach underneath currently Unable to tell V pacing at 100 currently Minimal burden of late by remotes    Mechanical MVR Functioning well by prior Echo.  Consider repeat once stabilized.   Dr. Ladona Ridgel will see later today Suspect short term plan will be no changes today > tomorrow Get barostim out Diurese Then recheck rhythm stability   For questions or updates, please contact [facility] Please consult [email or web address] for contact info under     Signed, Sheilah Pigeon, PA-C  04/11/2023, 7:31 AM    EP Attending  Patient seen and examined. Agree with above. Mr Luty continues to be stable.

## 2023-04-11 NOTE — Progress Notes (Signed)
ANTICOAGULATION CONSULT NOTE - Follow Up  Pharmacy Consult for heparin Indication: atrial fibrillation and mechanical mitral valve   No Known Allergies  Patient Measurements: Height: 6\' 1"  (185.4 cm) Weight: 92.2 kg (203 lb 4.2 oz) IBW/kg (Calculated) : 79.9 Heparin Dosing Weight: 94.3 kg   Vital Signs: BP: 97/72 (09/16 0500) Pulse Rate: 101 (09/16 0500)  Labs: Recent Labs    04/09/23 0240 04/09/23 1230 04/10/23 0441 04/10/23 1405 04/10/23 1425 04/11/23 0434  HGB 9.9*  --  9.4*  --   --  9.6*  HCT 32.5*  --  30.8*  --   --  30.9*  PLT 153  --  128*  --   --  125*  LABPROT 22.4*  --  22.3*  --   --  19.2*  INR 1.9*  --  1.9*  --   --  1.6*  HEPARINUNFRC <0.10*   < > <0.10*  --  0.52 0.13*  CREATININE 2.21*  --  2.29* 2.28*  --  2.70*   < > = values in this interval not displayed.    Estimated Creatinine Clearance: 24.7 mL/min (A) (by C-G formula based on SCr of 2.7 mg/dL (H)).   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   Conductive hearing loss    uses hearing aids   Dysrhythmia    A-fib   History of blood transfusion 03/24/2023   Hypothyroidism    Intraspinal abscess    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   Nonischemic cardiomyopathy (HCC)    Psychosexual dysfunction with inhibited sexual excitement    S/P mitral valve replacement    Syncope and collapse    Unspecified sleep apnea    last sleep study 11/07, uses cpap   Ventricular tachycardia (HCC)    VT (ventricular tachycardia) (HCC)     Medications:  Scheduled:   amoxicillin-clavulanate  1 tablet Oral Q12H   Chlorhexidine Gluconate Cloth  6 each Topical Daily    doxycycline  100 mg Oral Q12H   levothyroxine  88 mcg Oral QAC breakfast   melatonin  5 mg Oral QHS   multivitamin with minerals  1 tablet Oral q AM   mupirocin ointment  1 Application Topical Daily   quiniDINE gluconate  324 mg Oral BID   sodium chloride flush  10-40 mL Intracatheter Q12H   sodium chloride flush  3 mL Intravenous Q12H    Assessment: 82 yom presenting after ICD shocks - on enoxaparin and warfarin PTA (LD 9/10 PM). PTA regimen 2.5 mg daily except 5 mg MWF. Currently undergoing bridge given recent admission for Barostim implant complicated with L chest wall hematoma requiring I&D - still requiring wound VAC changes.  INR down to 1.6 - off warfarin. Heparin level down to subtherapeutic (0.13) on 1250 units/hr. No s/sx of bleeding. Having issues getting peripheral sticks - very hard stick and they tried 2 times. Heparin was paused 25 min and flushed. Likely the levels are fluctuating due to having to hold heparin and then get levels so not super accurate.  Goal of Therapy:  INR 3-3.5  - when warfarin not on hold Heparin level: 0.3-0.5 Monitor platelets by anticoagulation protocol:  Yes   Plan:  Increase heparin infusion to 1300 units/hr  Heparin level in 8 hours  Monitor daily HL, CBC, for s/sx of bleeding   Thank you for allowing pharmacy to participate in this patient's care,  Christoper Fabian, PharmD, BCPS Please see amion for complete clinical pharmacist phone list 04/11/2023 5:41 AM

## 2023-04-11 NOTE — Progress Notes (Addendum)
  Progress Note    04/11/2023 7:39 AM * No surgery date entered *  Subjective:  feeling fine    Vitals:   04/11/23 0600 04/11/23 0700  BP: 98/72 91/72  Pulse: 100 100  Resp: 13 (!) 21  Temp:    SpO2: 91% 96%    Physical Exam: General:  sitting up in the chair Lungs:  nonlabored Incisions:  left chest with wound vac with good seal, still with some surrounding edema Extremities:  BLE wrapped  CBC    Component Value Date/Time   WBC 6.4 04/11/2023 0434   RBC 3.06 (L) 04/11/2023 0434   HGB 9.6 (L) 04/11/2023 0434   HGB 16.6 12/04/2020 1445   HCT 30.9 (L) 04/11/2023 0434   HCT 50.1 12/04/2020 1445   PLT 125 (L) 04/11/2023 0434   PLT 158 12/04/2020 1445   MCV 101.0 (H) 04/11/2023 0434   MCV 94 12/04/2020 1445   MCH 31.4 04/11/2023 0434   MCHC 31.1 04/11/2023 0434   RDW 19.7 (H) 04/11/2023 0434   RDW 13.7 12/04/2020 1445   LYMPHSABS 0.3 (L) 04/06/2023 1030   MONOABS 1.1 (H) 04/06/2023 1030   EOSABS 0.1 04/06/2023 1030   BASOSABS 0.1 04/06/2023 1030    BMET    Component Value Date/Time   NA 134 (L) 04/11/2023 0434   NA 139 01/29/2021 0853   K 3.7 04/11/2023 0434   CL 96 (L) 04/11/2023 0434   CO2 28 04/11/2023 0434   GLUCOSE 132 (H) 04/11/2023 0434   BUN 35 (H) 04/11/2023 0434   BUN 18 01/29/2021 0853   CREATININE 2.70 (H) 04/11/2023 0434   CREATININE 1.22 (H) 09/12/2015 0846   CALCIUM 8.3 (L) 04/11/2023 0434   GFRNONAA 23 (L) 04/11/2023 0434   GFRAA 56 (L) 04/30/2020 1144    INR    Component Value Date/Time   INR 1.6 (H) 04/11/2023 0434     Intake/Output Summary (Last 24 hours) at 04/11/2023 0739 Last data filed at 04/11/2023 0630 Gross per 24 hour  Intake 1670.81 ml  Output 3320 ml  Net -1649.19 ml      Assessment/Plan:  82 y.o. male with barostim device    -Doing fine this morning, however has been in and out of slow VT over the weekend -L chest wall with wound vac with good seal -On heparin gtt. Holding coumadin -As long as patient  remains stable, we will still plan for barostim removal tomorrow in the OR with Dr.Roberta Angell. The patient is agreeable to this. Will place consent orders and make NPO at midnight   [facility], PA-C [facility] [phone] 04/11/2023 7:39 AM  I agree with the above.  I have seen and evaluated the patient.  We are planning for explant tomorrow.  I have spoken with Dr. Shirlee Latch who is in agreement with [place] proceeding.  Durene Cal

## 2023-04-11 NOTE — Progress Notes (Signed)
This encounter was created in error - please disregard.

## 2023-04-11 NOTE — Progress Notes (Addendum)
Patient ID: Austin [name], male   DOB: 18-Jan-1941, 82 y.o.   MRN: [id number]     Advanced Heart Failure Rounding Note  PCP-Cardiologist: None   Subjective:    Patient was paced out of slow VT on 9/13.  Since then, has been in and out of slow VT.  Was out of VT on 9/14 but went back in slow VT 9/15 Back in slow VT for a couple of hours rate around 100.  When out of VT, he has had atrial tachycardia with intermittent RV pacing. EP saw and programmed ( RV lead only) to 100 . Switched to lasix drip and given metolazone.   He is now on amiodarone gtt 60 mg/hr + quinidine.   Remains on lasix drip at 12 mg per hour. Made ~ 3.3 liters urine.Worsening renal function.   Frustrated about complications. Denies SOB.      Objective:   Weight Range: 91.2 kg Body mass index is 26.53 kg/m.   Vital Signs:   Temp:  [98.6 F (37 C)] 98.6 F (37 C) (09/15 1627) Pulse Rate:  [61-107] 100 (09/16 0600) Resp:  [8-33] 13 (09/16 0600) BP: (89-121)/(68-86) 98/72 (09/16 0600) SpO2:  [91 %-100 %] 91 % (09/16 0600) Weight:  [91.2 kg] 91.2 kg (09/16 0500) Last BM Date : 04/10/23  Weight change: Filed Weights   04/09/23 0500 04/10/23 0600 04/11/23 0500  Weight: 91.6 kg 92.2 kg 91.2 kg    Intake/Output:   Intake/Output Summary (Last 24 hours) at 04/11/2023 0721 Last data filed at 04/11/2023 0630 Gross per 24 hour  Intake 1670.81 ml  Output 3320 ml  Net -1649.19 ml     CVP 10  Physical Exam   General:  Sitting in the chair.  No resp difficulty HEENT: normal Neck: supple. JVP 9-10  Carotids 2+ bilat; no bruits. No lymphadenopathy or thryomegaly appreciated. Cor: PMI nondisplaced. Regular rate & rhythm. No rubs, gallops or murmurs. L upper chest VAC.  Lungs: clear Abdomen: soft, nontender, nondistended. No hepatosplenomegaly. No bruits or masses. Good bowel sounds. Extremities: no cyanosis, clubbing, rash, edema. RUE PICC. R and LLE 1+ edema with unna boots. R and LUE ecchymotic Neuro:  alert & orientedx3, cranial nerves grossly intact. moves all 4 extremities w/o difficulty. Affect pleasant   Telemetry  V- Pacing 100  EKG    No new EKG to review   Labs    CBC Recent Labs    04/10/23 0441 04/11/23 0434  WBC 7.8 6.4  HGB 9.4* 9.6*  HCT 30.8* 30.9*  MCV 101.0* 101.0*  PLT 128* 125*   Basic Metabolic Panel Recent Labs    95/63/87 0441 04/10/23 1405 04/11/23 0434  NA 132* 130* 134*  K 3.2* 4.0 3.7  CL 96* 95* 96*  CO2 26 24 28   GLUCOSE 96 224* 132*  BUN 35* 35* 35*  CREATININE 2.29* 2.28* 2.70*  CALCIUM 8.2* 8.0* 8.3*  MG 2.0  --  1.9  PHOS  --  4.0  --    Liver Function Tests Recent Labs    04/10/23 1405  ALBUMIN 3.0*    No results for input(s): "LIPASE", "AMYLASE" in the last 72 hours. Cardiac Enzymes No results for input(s): "CKTOTAL", "CKMB", "CKMBINDEX", "TROPONINI" in the last 72 hours.  BNP: BNP (last 3 results) Recent Labs    02/03/23 1502 03/23/23 1045 04/06/23 1026  BNP 453.3* 352.5* 1,258.7*    ProBNP (last 3 results) Recent Labs    12/16/22 0959  PROBNP 3,692*  D-Dimer No results for input(s): "DDIMER" in the last 72 hours. Hemoglobin A1C No results for input(s): "HGBA1C" in the last 72 hours. Fasting Lipid Panel No results for input(s): "CHOL", "HDL", "LDLCALC", "TRIG", "CHOLHDL", "LDLDIRECT" in the last 72 hours. Thyroid Function Tests No results for input(s): "TSH", "T4TOTAL", "T3FREE", "THYROIDAB" in the last 72 hours.  Invalid input(s): "FREET3"   Other results:   Imaging    No results found.   Medications:     Scheduled Medications:  amoxicillin-clavulanate  1 tablet Oral Q12H   Chlorhexidine Gluconate Cloth  6 each Topical Daily   doxycycline  100 mg Oral Q12H   levothyroxine  88 mcg Oral QAC breakfast   melatonin  5 mg Oral QHS   multivitamin with minerals  1 tablet Oral q AM   mupirocin ointment  1 Application Topical Daily   quiniDINE gluconate  324 mg Oral BID   sodium chloride  flush  10-40 mL Intracatheter Q12H   sodium chloride flush  3 mL Intravenous Q12H    Infusions:  sodium chloride     amiodarone 60 mg/hr (04/11/23 0600)   furosemide (LASIX) 200 mg in dextrose 5 % 100 mL (2 mg/mL) infusion 12 mg/hr (04/11/23 0600)   heparin 1,300 Units/hr (04/11/23 0600)    PRN Medications: sodium chloride, acetaminophen, HYDROcodone-acetaminophen, nitroGLYCERIN, ondansetron (ZOFRAN) IV, polyethylene glycol, sodium chloride flush, sodium chloride flush    Patient Profile   82 y/o male w/ 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    Now readmitted for ICD shocks for VT and a/c CHF.  Assessment/Plan   1. VT: History of VT and VT ablation in 2017.  MDT ICD.  Redo VT ablation in 1/23 (Dr. Marquette Saa).  He is on amiodarone, this was increased to 200 mg daily as outpatient with recurrent VT.  He has tolerated amiodarone poorly historically but is doing ok with it today.  In 7/23, slow VT was pace-terminated in the office, then he had an ICD shock for VT later in 7/23.  He was started on quinidine after this.  He is followed by Dr. Ladona Ridgel.  Now with 2 further ICD shocks for VT prior to this admission, had been in slow VT rate 100s-110s this admission.  Paced out of VT 9/13 by EP, since then has been in and out of slow VT.  Yesterday went back in slow VT. EP programmed ( RV lead only) to 100  - Continue amiodarone gtt 60 mg/hr  - Continue quinidine.  2. Acute on chronic systolic CHF: Long-standing, suspected nonischemic cardiomyopathy.  Had RHC on 02/26/21 with normal filling pressures and mildly low cardiac output.  Echo in 4/23 showed  EF 20-25% with diffuse hypokinesis and septal-lateral dyssynchrony, mildly decreased RV  systolic function, mechanical MV with normal function, dilated IVC.  He has a [employer] CRT-D device present, but the LV lead is not functional.  He has complete heart block, so has been RV paced chronically with a wide QRS.  Has had difficulty tolerating GDMT historically with CKD and hypotension.  Currently off his home GDMT due to low BP and elevated creatinine.  Echo this admission with EF 20%, mechanical MV mean gradient 8 mmHg.   Improved diuresis with lasix drip and metolazone. CVP down to 10. Needs additional  diuresis. Continue lasix drip 12 mg per hour.  H Creatinine 2.32 => 2.21 => 2.29=>2.7  Co-ox 64% - Spironolactone, valsartan, and Toprol XL on hold for now with low SBP. In the 90s. May need to add Norepi if <90.  - Off digoxin due to persistently elevated level.  - He did not tolerate SGLT2 inhibitor in the past.  - The patient has CHB and has been chronically RV pacing as his LV lead is nonfunctional.  This is not ideal and creates dyssynchrony with a very wide QRS.  Left bundle lead placement discussed in past with Dr. Ladona Ridgel but thought risk would be too high and left bundle lead was not attempted.   - Barostimulator device placed but complicated by chest wall hematoma as below and is going to need to be removed.  3. Atrial fibrillation/flutter, atrial tachycardia: Paroxysmal.  This admission, when out of VT, he is in atrial tachycardia.  - Continues on amiodarone.  - On Heparin drip.  - Consider DCCV this admission to get him out of AT and back in NSR.  4. Complete heart block: Patient is pacer dependent due to CHB.  - See above, upgrade to functional CRT thought to be too risky due to need to extract old CS lead. 5. OSA: Severe. Continue CPAP 6. Mechanical mitral valve: Mean gradient higher on echo this admission at 8 mmHg.  - Heparin gtt and off warfarin for now.  - After barostimulator is out, restart warfarin for INR goal 2.5-3.5.   -INR 1.6  7. Chest Wall Hematoma: post  barostimulator complication, in setting of coumadin for mechanical MV.  s/p I&D for evacuation of hematoma and placement of wound vac + placement of antibiotic beads on 8/30.  - On Augmentin.  - wound vac management per [name]  - Planning barostim removal tomorrow.  - Warfarin on hold, on heparin gtt.  8. AKI on CKD stage 3: Creatinine baseline 1.5-2.  2.32 => 2.21 => 2.29=>2.7  today.  -Making over 3.3 liters urine.     Length of Stay: 5  Amy Clegg, NP  04/11/2023, 7:21 AM  Advanced Heart Failure Team Pager [phone] (M-F; 7a - 5p)  Please contact [facility] for night-coverage after hours (5p -7a ) and weekends on [email or web address]  Patient seen with NP, agree with the above note.   Yesterday, he was paced out of slow VT.  Underlying rhythm appears to be atrial tachycardia with controlled ventricular rate.  To try to prevent further VT, he pacing rate was increased to 100 bpm.  This morning, he is RV pacing at 100 bpm.    He was diuresed with IV Lasix and metolazone yesterday, I/Os net negative 1649 and weight down 2 lbs.  Legs still swollen.  CVP 12.  Creatinine up 2.28 => 2.7.  Co-ox 64%.   He is off warfarin for barostimulator removal, remains on heparin gtt.   Denies dyspnea, frustrated.   General: NAD Neck: JVP 12-14 cm, no thyromegaly or thyroid nodule.  Lungs: Clear to auscultation bilaterally with normal respiratory effort. CV: Nondisplaced PMI.  Heart regular S1/S2 with mechanical S1, no S3/S4, no murmur.  2+ edema to knees.  Abdomen: Soft, nontender, no hepatosplenomegaly, no distention.  Skin: Intact without lesions or rashes.  Neurologic: Alert and oriented x 3.  Psych: Normal affect. Extremities: No clubbing or cyanosis.  HEENT: Normal.   He diuresed reasonably well yesterday but creatinine is up.  Co-ox adequate at 64%, will not use inotrope due to  VT.  CVP 12-13, still with significant peripheral edema.  - Continue Lasix 12 mg/hr but will hold off on metolazone  today with creatinine rise.  - Unna boots.   He will have barostimulator out tomorrow.  Will then restart heparin gtt/warfarin overlap to therapeutic INR with mechanical MV.   Still having episodes of slow VT rate 100s.  Co-ox drops with VT.  He was paced out of VT yesterday, now RV pacing set for 100 bpm.  At baseline, he RV paces most of the time.  His underlying atrial rhythm appears to be atrial tachycardia.  - Continue reloading amiodarone 60 mg/hr  - Continue quinidine.  - No other medication options.  - Discussed yesterday with Dr. Graciela Husbands, ?transfer to [facility] for high risk repeat VT ablation.  Dr. Ladona Ridgel to see today.   CRITICAL CARE Performed by: Marca Ancona  Total critical care time: 35 minutes  Critical care time was exclusive of separately billable procedures and treating other patients.  Critical care was necessary to treat or prevent imminent or life-threatening deterioration.  Critical care was time spent personally by me on the following activities: development of treatment plan with patient and/or surrogate as well as nursing, discussions with consultants, evaluation of patient's response to treatment, examination of patient, obtaining history from patient or surrogate, ordering and performing treatments and interventions, ordering and review of laboratory studies, ordering and review of radiographic studies, pulse oximetry and re-evaluation of patient's condition.  Marca Ancona 04/11/2023 10:37 AM

## 2023-04-11 NOTE — Plan of Care (Signed)

## 2023-04-11 NOTE — Progress Notes (Cosign Needed Addendum)
Rounding Note    Patient Name: Austin [name] Date of Encounter: 04/11/2023  [facility] Cardiologist: Dr. Shirlee Latch EP: Dr. Ladona Ridgel  Subjective   OOB to chair, tired, no dizzy or lightheaded, no CP  Inpatient Medications    Scheduled Meds:  amoxicillin-clavulanate  1 tablet Oral Q12H   Chlorhexidine Gluconate Cloth  6 each Topical Daily   doxycycline  100 mg Oral Q12H   levothyroxine  88 mcg Oral QAC breakfast   melatonin  5 mg Oral QHS   multivitamin with minerals  1 tablet Oral q AM   mupirocin ointment  1 Application Topical Daily   quiniDINE gluconate  324 mg Oral BID   sodium chloride flush  10-40 mL Intracatheter Q12H   sodium chloride flush  3 mL Intravenous Q12H   Continuous Infusions:  sodium chloride     amiodarone 60 mg/hr (04/11/23 0600)   furosemide (LASIX) 200 mg in dextrose 5 % 100 mL (2 mg/mL) infusion 12 mg/hr (04/11/23 0600)   heparin 1,300 Units/hr (04/11/23 0600)   PRN Meds: sodium chloride, acetaminophen, HYDROcodone-acetaminophen, nitroGLYCERIN, ondansetron (ZOFRAN) IV, polyethylene glycol, sodium chloride flush, sodium chloride flush   Vital Signs    Vitals:   04/11/23 0300 04/11/23 0400 04/11/23 0500 04/11/23 0600  BP: 97/78 96/72 97/72  98/72  Pulse: 100 100 (!) 101 100  Resp: 17 20 (!) 24 13  Temp:      TempSrc:      SpO2: 96% 96% 93% 91%  Weight:   91.2 kg   Height:        Intake/Output Summary (Last 24 hours) at 04/11/2023 0731 Last data filed at 04/11/2023 0630 Gross per 24 hour  Intake 1670.81 ml  Output 3320 ml  Net -1649.19 ml      04/11/2023    5:00 AM 04/10/2023    6:00 AM 04/09/2023    5:00 AM  Last 3 Weights  Weight (lbs) 201 lb 1 oz 203 lb 4.2 oz 201 lb 15.1 oz  Weight (kg) 91.2 kg 92.2 kg 91.6 kg      Telemetry    VP 100 - Personally Reviewed  ECG    No new EKGs - Personally Reviewed  Physical Exam   GEN: No acute distress.   Neck: No JVD Cardiac: RRR, valve appreciated, rubs, or gallops.   Respiratory: crackles at the bases b/l. GI: Soft, nontender, non-distended  MS: marked edema remains; No deformity. Neuro:  Nonfocal  Psych: Normal affect   Labs    High Sensitivity Troponin:   Recent Labs  Lab 04/06/23 1026 04/06/23 1430  TROPONINIHS 64* 62*     Chemistry Recent Labs  Lab 04/06/23 1030 04/07/23 0247 04/09/23 0240 04/10/23 0441 04/10/23 1405 04/11/23 0434  NA 138   < > 133* 132* 130* 134*  K 3.6   < > 3.4* 3.2* 4.0 3.7  CL 106   < > 99 96* 95* 96*  CO2 20*   < > 24 26 24 28   GLUCOSE 97   < > 113* 96 224* 132*  BUN 28*   < > 33* 35* 35* 35*  CREATININE 1.90*   < > 2.21* 2.29* 2.28* 2.70*  CALCIUM 8.3*   < > 8.1* 8.2* 8.0* 8.3*  MG 1.9   < > 2.1 2.0  --  1.9  PROT 5.9*  --   --   --   --   --   ALBUMIN 3.2*  --   --   --  3.0*  --   AST 30  --   --   --   --   --   ALT 24  --   --   --   --   --   ALKPHOS 78  --   --   --   --   --   BILITOT 1.4*  --   --   --   --   --   GFRNONAA 35*   < > 29* 28* 28* 23*  ANIONGAP 12   < > 10 10 11 10    < > = values in this interval not displayed.    Lipids No results for input(s): "CHOL", "TRIG", "HDL", "LABVLDL", "LDLCALC", "CHOLHDL" in the last 168 hours.  Hematology Recent Labs  Lab 04/09/23 0240 04/10/23 0441 04/11/23 0434  WBC 11.0* 7.8 6.4  RBC 3.24* 3.05* 3.06*  HGB 9.9* 9.4* 9.6*  HCT 32.5* 30.8* 30.9*  MCV 100.3* 101.0* 101.0*  MCH 30.6 30.8 31.4  MCHC 30.5 30.5 31.1  RDW 19.9* 19.6* 19.7*  PLT 153 128* 125*   Thyroid  Recent Labs  Lab 04/06/23 1006 04/06/23 1030  TSH 18.440*  --   FREET4  --  1.33*    BNP Recent Labs  Lab 04/06/23 1026  BNP 1,258.7*    DDimer No results for input(s): "DDIMER" in the last 168 hours.   Radiology    DG Chest Port 1 View Result Date: 04/09/2023 CLINICAL DATA:  PICC line placement EXAM: PORTABLE CHEST 1 VIEW COMPARISON:  04/06/2023 FINDINGS: Single frontal view of the chest demonstrates interval placement of a left-sided PICC, tip overlying  superior vena cava. 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 Stable multi lead AICD and left-sided nerve stimulator. Cardiac silhouette is unchanged. Continued central vascular congestion, with patchy bibasilar consolidation right greater than left increased since prior study. No effusion or pneumothorax. No acute bony abnormalities. IMPRESSION: 1. Left-sided PICC as above. 2. Continued central vascular congestion, with progressive bibasilar consolidation favoring atelectasis. Electronically Signed   By: Sharlet Salina M.D.   On: 04/09/2023 17:49      Cardiac Studies    04/07/23: limited echo  1. Left ventricular ejection fraction, by estimation, is 20%. The left  ventricle has severely decreased function. The left ventricle demonstrates  global hypokinesis. No LV thrombus noted. Indeterminant diastolic  function.   2. Peak RV-RA gradient 19 mmHg. IVC not visualized. Right ventricular  systolic function is mildly reduced. The right ventricular size is normal.   3. Mechanical mitral valve with mean gradient 8 mmHg, increased from  prior. No mitral regurgitation noted though shadowing from prosthetic  valve makes this difficult to discern.   4. Tricuspid valve regurgitation is moderate.   5. The aortic valve is tricuspid. There is moderate calcification of the  aortic valve. Doppler evaluation of aortic valve not done.   6. Limited echo.   Patient Profile     82 y.o. male w/PMHx of  VT (ablation 2017), AFib, CHB, chronic CHF (systolic), VHD s/p MVR (mechanical, 2000), hypothyroidism (s/p thyroidectomy), NICM   Underwent barostim implant 03/17/23 >> complicated by chest wall hematoma >> I&D (x2)  > wound vac  Admitted with VT    Device information MDT CRT d ICD implanted 08/05/2011, gen change 05/04/2017 (has an LV lead in, abandoned  2/2 high thresholds >with a dual chamber device in) + hx of VT Nov 2021, VT 160's required external shock  VT ablation  09/15/2015  AAD Amiodarone goes back to 2010  Assessment & Plan    VT with ICD shock Perhaps provoked by volume OL Recurrent slow VTs here have been pace terminated as well as some spontaneously converted events Poorly tolerated with change in his hemodynamics with it Home quinidine Amiodarone gtt remains at 60 Pace terminated by Dr. Graciela Husbands yesterday >> VP at 100 without recurrent VT so far  RV paces a lot (CHB history) 89%   Acute on chronic systolic CHF EF 20-25% on Echo 09/28/2022 AHF team is on board Cumulatively neg -4537 Creat climbing 2.70 (baseline looks probably 1.8-2.0) Remains volume OL Picc now with b/l IV infiltrations    Barostim implantation 8/22 Complicated by hematoma and poorly healing site that has continued to drain. Vasc team on board >> removal tomorrow planned   Atrial flutter Longstanding persistent Afib Left sided flutter Managed on coumadin for CHA2DS2VASc of at least 4.  V pacing currently, by notes > ATach underneath currently Unable to tell V pacing at 100 currently Minimal burden of late by remotes    Mechanical MVR Functioning well by prior Echo.  Consider repeat once stabilized.   Dr. Ladona Ridgel will see later today Suspect short term plan will be no changes today > tomorrow Get barostim out Diurese Then recheck rhythm stability   For questions or updates, please contact [facility] Please consult [email or web address] for contact info under     Signed, Sheilah Pigeon, PA-C  04/11/2023, 7:31 AM    EP Attending  Patient seen and examined. Agree with above. Mr Blaker continues to be stable. He will undergo removal of his Barostim later today. He will continue IV amio with transition to oral amio tomorrow or the next day. I paced him back out of VT earlier today with ATP at 460/min. He remains in atrial flutter.   Sharlot Gowda Cadance Raus,MD

## 2023-04-11 NOTE — H&P (View-Only) (Signed)
  Progress Note    04/11/2023 7:39 AM * No surgery date entered *  Subjective:  feeling fine    Vitals:   04/11/23 0600 04/11/23 0700  BP: 98/72 91/72  Pulse: 100 100  Resp: 13 (!) 21  Temp:    SpO2: 91% 96%    Physical Exam: General:  sitting up in the chair Lungs:  nonlabored Incisions:  left chest with wound vac with good seal, still with some surrounding edema Extremities:  BLE wrapped  CBC    Component Value Date/Time   WBC 6.4 04/11/2023 0434   RBC 3.06 (L) 04/11/2023 0434   HGB 9.6 (L) 04/11/2023 0434   HGB 16.6 12/04/2020 1445   HCT 30.9 (L) 04/11/2023 0434   HCT 50.1 12/04/2020 1445   PLT 125 (L) 04/11/2023 0434   PLT 158 12/04/2020 1445   MCV 101.0 (H) 04/11/2023 0434   MCV 94 12/04/2020 1445   MCH 31.4 04/11/2023 0434   MCHC 31.1 04/11/2023 0434   RDW 19.7 (H) 04/11/2023 0434   RDW 13.7 12/04/2020 1445   LYMPHSABS 0.3 (L) 04/06/2023 1030   MONOABS 1.1 (H) 04/06/2023 1030   EOSABS 0.1 04/06/2023 1030   BASOSABS 0.1 04/06/2023 1030    BMET    Component Value Date/Time   NA 134 (L) 04/11/2023 0434   NA 139 01/29/2021 0853   K 3.7 04/11/2023 0434   CL 96 (L) 04/11/2023 0434   CO2 28 04/11/2023 0434   GLUCOSE 132 (H) 04/11/2023 0434   BUN 35 (H) 04/11/2023 0434   BUN 18 01/29/2021 0853   CREATININE 2.70 (H) 04/11/2023 0434   CREATININE 1.22 (H) 09/12/2015 0846   CALCIUM 8.3 (L) 04/11/2023 0434   GFRNONAA 23 (L) 04/11/2023 0434   GFRAA 56 (L) 04/30/2020 1144    INR    Component Value Date/Time   INR 1.6 (H) 04/11/2023 0434     Intake/Output Summary (Last 24 hours) at 04/11/2023 0739 Last data filed at 04/11/2023 0630 Gross per 24 hour  Intake 1670.81 ml  Output 3320 ml  Net -1649.19 ml      Assessment/Plan:  82 y.o. male with barostim device    -Doing fine this morning, however has been in and out of slow VT over the weekend -L chest wall with wound vac with good seal -On heparin gtt. Holding coumadin -As long as patient  remains stable, we will still plan for barostim removal tomorrow in the OR with Dr.Roberta Angell. The patient is agreeable to this. Will place consent orders and make NPO at midnight   [facility], PA-C [facility] [phone] 04/11/2023 7:39 AM  I agree with the above.  I have seen and evaluated the patient.  We are planning for explant tomorrow.  I have spoken with Dr. Shirlee Latch who is in agreement with [place] proceeding.  Durene Cal

## 2023-04-11 NOTE — Progress Notes (Addendum)
ANTICOAGULATION CONSULT NOTE - Follow Up  Pharmacy Consult for heparin Indication: atrial fibrillation and mechanical mitral valve   No Known Allergies  Patient Measurements: Height: 6\' 1"  (185.4 cm) Weight: 91.2 kg (201 lb 1 oz) IBW/kg (Calculated) : 79.9 Heparin Dosing Weight: 94.3 kg   Vital Signs: Temp: 97.9 F (36.6 C) (09/16 1211) Temp Source: Oral (09/16 1211) BP: 101/74 (09/16 1300) Pulse Rate: 99 (09/16 1300)  Labs: Recent Labs    04/09/23 0240 04/09/23 1230 04/10/23 0441 04/10/23 1405 04/10/23 1425 04/11/23 0434 04/11/23 1541  HGB 9.9*  --  9.4*  --   --  9.6*  --   HCT 32.5*  --  30.8*  --   --  30.9*  --   PLT 153  --  128*  --   --  125*  --   LABPROT 22.4*  --  22.3*  --   --  19.2*  --   INR 1.9*  --  1.9*  --   --  1.6*  --   HEPARINUNFRC <0.10*   < > <0.10*  --  0.52 0.13* 0.31  CREATININE 2.21*  --  2.29* 2.28*  --  2.70*  --    < > = values in this interval not displayed.    Estimated Creatinine Clearance: 24.7 mL/min (A) (by C-G formula based on SCr of 2.7 mg/dL (H)).   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   Conductive hearing loss    uses hearing aids   Dysrhythmia    A-fib   History of blood transfusion 03/24/2023   Hypothyroidism    Intraspinal abscess    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   Nonischemic cardiomyopathy (HCC)    Psychosexual dysfunction with inhibited sexual excitement    S/P mitral valve replacement    Syncope and collapse    Unspecified sleep apnea    last sleep study 11/07, uses cpap   Ventricular tachycardia (HCC)    VT (ventricular tachycardia) (HCC)      Medications:  Scheduled:   amoxicillin-clavulanate  1 tablet Oral BID   Chlorhexidine Gluconate Cloth  6 each Topical Daily   doxycycline  100 mg Oral Q12H   levothyroxine  88 mcg Oral QAC breakfast   melatonin  5 mg Oral QHS   multivitamin with minerals  1 tablet Oral q AM   mupirocin ointment  1 Application Topical Daily   potassium chloride  40 mEq Oral BID   quiniDINE gluconate  324 mg Oral BID   sodium chloride flush  10-40 mL Intracatheter Q12H   sodium chloride flush  3 mL Intravenous Q12H    Assessment: 82 yom presenting after ICD shocks - on enoxaparin and warfarin PTA (LD 9/10 PM). PTA regimen 2.5 mg daily except 5 mg MWF. Currently undergoing bridge given recent admission for Barostim implant complicated with L chest wall hematoma requiring I&D - still requiring wound VAC changes.  INR down to 1.6 - off warfarin. Heparin level down to subtherapeutic (0.13) on 1250 units/hr. No s/sx of bleeding. Having issues getting peripheral sticks - very hard stick and they tried 2 times. Heparin was paused 25 min and  flushed. Likely the levels are fluctuating due to having to hold heparin and then get levels so not super accurate.  PM: heparin level 0.31 on heparin 1300 units/hr, was able to obtain via peripheral stick. No bleeding or issues with infusion reported per nursing.  Goal of Therapy:  INR 3-3.5  - when warfarin not on hold Heparin level: 0.3-0.5 Monitor platelets by anticoagulation protocol: Yes   Plan:  Continue heparin infusion at 1300 units/hr  Monitor daily HL, CBC, for s/sx of bleeding   Thank you for allowing pharmacy to participate in this patient's care,  Loralee Pacas, PharmD, BCPS Please see amion for complete clinical pharmacist phone list 04/11/2023 4:51 PM

## 2023-04-12 ENCOUNTER — Encounter (HOSPITAL_COMMUNITY)
Admission: EM | Disposition: A | Discharge status: Home / Self Care | Payer: Self-pay | Source: Skilled Nursing Facility | Attending: Cardiology

## 2023-04-12 ENCOUNTER — Inpatient Hospital Stay (HOSPITAL_COMMUNITY): Payer: Medicare Other | Admitting: Anesthesiology

## 2023-04-12 ENCOUNTER — Other Ambulatory Visit: Payer: Self-pay

## 2023-04-12 DIAGNOSIS — I97648 Postprocedural seroma of a circulatory system organ or structure following other circulatory system procedure: Secondary | ICD-10-CM | POA: Diagnosis not present

## 2023-04-12 DIAGNOSIS — I472 Ventricular tachycardia, unspecified: Secondary | ICD-10-CM | POA: Diagnosis not present

## 2023-04-12 HISTORY — PX: REMOVAL OF BAROSTIM: SHX6771

## 2023-04-12 LAB — BASIC METABOLIC PANEL
Anion gap: 12 (ref 5–15)
BUN: 33 mg/dL — ABNORMAL HIGH (ref 8–23)
CO2: 26 mmol/L (ref 22–32)
Calcium: 8.2 mg/dL — ABNORMAL LOW (ref 8.9–10.3)
Chloride: 92 mmol/L — ABNORMAL LOW (ref 98–111)
Creatinine, Ser: 2.38 mg/dL — ABNORMAL HIGH (ref 0.61–1.24)
GFR, Estimated: 27 mL/min — ABNORMAL LOW (ref >60)
Glucose, Bld: 182 mg/dL — ABNORMAL HIGH (ref 70–99)
Potassium: 3.8 mmol/L (ref 3.5–5.1)
Sodium: 130 mmol/L — ABNORMAL LOW (ref 135–145)

## 2023-04-12 LAB — CBC
HCT: 31.4 % — ABNORMAL LOW (ref 39.0–52.0)
HCT: 33.5 % — ABNORMAL LOW (ref 39.0–52.0)
Hemoglobin: 9.7 g/dL — ABNORMAL LOW (ref 13.0–17.0)
Hemoglobin: 10 g/dL — ABNORMAL LOW (ref 13.0–17.0)
MCH: 30.9 pg (ref 26.0–34.0)
MCH: 30 pg (ref 26.0–34.0)
MCHC: 30.9 g/dL (ref 30.0–36.0)
MCHC: 29.9 g/dL — ABNORMAL LOW (ref 30.0–36.0)
MCV: 100 fL (ref 80.0–100.0)
MCV: 100.6 fL — ABNORMAL HIGH (ref 80.0–100.0)
Platelets: 118 10*3/uL — ABNORMAL LOW (ref 150–400)
Platelets: 117 10*3/uL — ABNORMAL LOW (ref 150–400)
RBC: 3.14 MIL/uL — ABNORMAL LOW (ref 4.22–5.81)
RBC: 3.33 MIL/uL — ABNORMAL LOW (ref 4.22–5.81)
RDW: 19.8 % — ABNORMAL HIGH (ref 11.5–15.5)
RDW: 19.8 % — ABNORMAL HIGH (ref 11.5–15.5)
WBC: 6.5 10*3/uL (ref 4.0–10.5)
WBC: 6.7 10*3/uL (ref 4.0–10.5)
nRBC: 0 % (ref 0.0–0.2)
nRBC: 0 % (ref 0.0–0.2)

## 2023-04-12 LAB — PROTIME-INR
INR: 1.4 — ABNORMAL HIGH (ref 0.8–1.2)
Prothrombin Time: 17.2 s — ABNORMAL HIGH (ref 11.4–15.2)

## 2023-04-12 LAB — COOXEMETRY PANEL
Carboxyhemoglobin: 1.9 % — ABNORMAL HIGH (ref 0.5–1.5)
Carboxyhemoglobin: 2.4 % — ABNORMAL HIGH (ref 0.5–1.5)
Methemoglobin: <0.7 % (ref 0.0–1.5)
Methemoglobin: 0.9 % (ref 0.0–1.5)
O2 Saturation: 50.2 %
O2 Saturation: 55 %
Total hemoglobin: 10.3 g/dL — ABNORMAL LOW (ref 12.0–16.0)
Total hemoglobin: 10.4 g/dL — ABNORMAL LOW (ref 12.0–16.0)

## 2023-04-12 LAB — PREPARE RBC (CROSSMATCH)

## 2023-04-12 LAB — HEPARIN LEVEL (UNFRACTIONATED): Heparin Unfractionated: 0.16 [IU]/mL — ABNORMAL LOW (ref 0.30–0.70)

## 2023-04-12 LAB — MAGNESIUM: Magnesium: 2.2 mg/dL (ref 1.7–2.4)

## 2023-04-12 SURGERY — REMOVAL OF BAROSTIM
Anesthesia: General

## 2023-04-12 MED ORDER — ONDANSETRON HCL 4 MG/2ML IJ SOLN
INTRAMUSCULAR | Status: AC
  Filled 2023-04-12: qty 2

## 2023-04-12 MED ORDER — FENTANYL CITRATE (PF) 100 MCG/2ML IJ SOLN: 25.0000 ug | INTRAMUSCULAR | Status: DC | PRN

## 2023-04-12 MED ORDER — OXYCODONE HCL 5 MG PO TABS: 5.0000 mg | ORAL_TABLET | Freq: Once | ORAL | Status: AC

## 2023-04-12 MED ORDER — VASOPRESSIN 20 UNIT/ML IV SOLN
INTRAVENOUS | Status: DC | PRN
  Administered 2023-04-12: 2 [IU] via INTRAVENOUS
  Administered 2023-04-12: 1 [IU] via INTRAVENOUS

## 2023-04-12 MED ORDER — PHENYLEPHRINE HCL-NACL 20-0.9 MG/250ML-% IV SOLN
INTRAVENOUS | Status: DC | PRN
Start: 2023-04-12 — End: 2023-04-12
  Administered 2023-04-12: 25 ug/min via INTRAVENOUS

## 2023-04-12 MED ORDER — HEMOSTATIC AGENTS (NO CHARGE) OPTIME
TOPICAL | Status: DC | PRN
  Administered 2023-04-12: 1 via TOPICAL

## 2023-04-12 MED ORDER — SUGAMMADEX SODIUM 200 MG/2ML IV SOLN
INTRAVENOUS | Status: DC | PRN
Start: 2023-04-12 — End: 2023-04-12
  Administered 2023-04-12: 200 mg via INTRAVENOUS

## 2023-04-12 MED ORDER — CALCIUM CHLORIDE 10 % IV SOLN
INTRAVENOUS | Status: DC | PRN
Start: 2023-04-12 — End: 2023-04-12
  Administered 2023-04-12 (×2): 250 mg via INTRAVENOUS

## 2023-04-12 MED ORDER — LIDOCAINE-EPINEPHRINE (PF) 1 %-1:200000 IJ SOLN
INTRAMUSCULAR | Status: AC
  Filled 2023-04-12: qty 30

## 2023-04-12 MED ORDER — POTASSIUM CHLORIDE CRYS ER 20 MEQ PO TBCR
40.0000 meq | EXTENDED_RELEASE_TABLET | Freq: Once | ORAL | Status: DC
  Filled 2023-04-12: qty 2

## 2023-04-12 MED ORDER — POTASSIUM CHLORIDE CRYS ER 20 MEQ PO TBCR
40.0000 meq | EXTENDED_RELEASE_TABLET | Freq: Once | ORAL | Status: AC
  Administered 2023-04-12: 40 meq via ORAL
  Filled 2023-04-12: qty 2

## 2023-04-12 MED ORDER — DEXAMETHASONE SODIUM PHOSPHATE 10 MG/ML IJ SOLN
INTRAMUSCULAR | Status: AC
  Filled 2023-04-12: qty 1

## 2023-04-12 MED ORDER — ONDANSETRON HCL 4 MG/2ML IJ SOLN
INTRAMUSCULAR | Status: DC | PRN
Start: 2023-04-12 — End: 2023-04-12
  Administered 2023-04-12: 4 mg via INTRAVENOUS

## 2023-04-12 MED ORDER — LIDOCAINE HCL (CARDIAC) PF 100 MG/5ML IV SOSY
PREFILLED_SYRINGE | INTRAVENOUS | Status: DC | PRN
Start: 2023-04-12 — End: 2023-04-12
  Administered 2023-04-12: 60 mg via INTRATRACHEAL

## 2023-04-12 MED ORDER — HEPARIN 6000 UNIT IRRIGATION SOLUTION
Status: AC
  Filled 2023-04-12: qty 500

## 2023-04-12 MED ORDER — ROCURONIUM BROMIDE 100 MG/10ML IV SOLN
INTRAVENOUS | Status: DC | PRN
Start: 2023-04-12 — End: 2023-04-12
  Administered 2023-04-12: 80 mg via INTRAVENOUS
  Administered 2023-04-12 (×2): 20 mg via INTRAVENOUS

## 2023-04-12 MED ORDER — ALBUMIN HUMAN 5 % IV SOLN
INTRAVENOUS | Status: DC | PRN
Start: 2023-04-12 — End: 2023-04-12

## 2023-04-12 MED ORDER — DEXAMETHASONE SODIUM PHOSPHATE 10 MG/ML IJ SOLN
INTRAMUSCULAR | Status: DC | PRN
  Administered 2023-04-12: 10 mg via INTRAVENOUS

## 2023-04-12 MED ORDER — OXYCODONE HCL 5 MG PO TABS
ORAL_TABLET | ORAL | Status: AC
  Administered 2023-04-12: 5 mg via ORAL
  Filled 2023-04-12: qty 1

## 2023-04-12 MED ORDER — EPHEDRINE SULFATE (PRESSORS) 50 MG/ML IJ SOLN
INTRAMUSCULAR | Status: DC | PRN
Start: 2023-04-12 — End: 2023-04-12
  Administered 2023-04-12: 10 mg via INTRAVENOUS
  Administered 2023-04-12: 5 mg via INTRAVENOUS

## 2023-04-12 MED ORDER — HEPARIN (PORCINE) 25000 UT/250ML-% IV SOLN
1500.0000 [IU]/h | INTRAVENOUS | Status: DC
  Administered 2023-04-13 – 2023-04-15 (×3): 1400 [IU]/h via INTRAVENOUS
  Administered 2023-04-16 – 2023-04-17 (×2): 1500 [IU]/h via INTRAVENOUS
  Filled 2023-04-12 (×6): qty 250

## 2023-04-12 MED ORDER — PROPOFOL 10 MG/ML IV BOLUS
INTRAVENOUS | Status: DC | PRN
Start: 2023-04-12 — End: 2023-04-12
  Administered 2023-04-12: 60 mg via INTRAVENOUS

## 2023-04-12 MED ORDER — 0.9 % SODIUM CHLORIDE (POUR BTL) OPTIME
TOPICAL | Status: DC | PRN
  Administered 2023-04-12: 1000 mL

## 2023-04-12 MED ORDER — PHENYLEPHRINE HCL (PRESSORS) 10 MG/ML IV SOLN
INTRAVENOUS | Status: DC | PRN
Start: 2023-04-12 — End: 2023-04-12
  Administered 2023-04-12 (×3): 80 ug via INTRAVENOUS

## 2023-04-12 MED ORDER — VASOPRESSIN 20 UNIT/ML IV SOLN
INTRAVENOUS | Status: AC
  Filled 2023-04-12: qty 1

## 2023-04-12 MED ORDER — ONDANSETRON HCL 4 MG/2ML IJ SOLN: 4.0000 mg | Freq: Once | INTRAMUSCULAR | Status: DC | PRN

## 2023-04-12 MED ORDER — FENTANYL CITRATE (PF) 250 MCG/5ML IJ SOLN
INTRAMUSCULAR | Status: DC | PRN
  Administered 2023-04-12: 50 ug via INTRAVENOUS

## 2023-04-12 MED ORDER — CEFAZOLIN SODIUM-DEXTROSE 2-3 GM-%(50ML) IV SOLR
INTRAVENOUS | Status: DC | PRN
Start: 2023-04-12 — End: 2023-04-12
  Administered 2023-04-12: 2 g via INTRAVENOUS

## 2023-04-12 MED ORDER — METOLAZONE 2.5 MG PO TABS
2.5000 mg | ORAL_TABLET | Freq: Once | ORAL | Status: AC
  Administered 2023-04-12: 2.5 mg via ORAL
  Filled 2023-04-12: qty 1

## 2023-04-12 MED ORDER — POTASSIUM CHLORIDE CRYS ER 20 MEQ PO TBCR
20.0000 meq | EXTENDED_RELEASE_TABLET | Freq: Once | ORAL | Status: AC
  Administered 2023-04-12: 20 meq via ORAL
  Filled 2023-04-12: qty 1

## 2023-04-12 MED ORDER — FENTANYL CITRATE (PF) 250 MCG/5ML IJ SOLN
INTRAMUSCULAR | Status: AC
  Filled 2023-04-12: qty 5

## 2023-04-12 MED ORDER — NOREPINEPHRINE BITARTRATE 1 MG/ML IV SOLN
INTRAVENOUS | Status: DC | PRN
  Administered 2023-04-12 (×2): 6 mL via INTRAVENOUS

## 2023-04-12 SURGICAL SUPPLY — 50 items
ADH SKN CLS APL DERMABOND .7 (GAUZE/BANDAGES/DRESSINGS) ×1
AGENT HMST KT MTR STRL THRMB (HEMOSTASIS) ×1
APL PRP STRL LF DISP 70% ISPRP (MISCELLANEOUS) ×1
BAG COUNTER SPONGE SURGICOUNT (BAG) ×1 IMPLANT
BAG SPNG CNTER NS LX DISP (BAG) ×1
CANISTER SUCT 3000ML PPV (MISCELLANEOUS) ×1 IMPLANT
CHLORAPREP W/TINT 26 (MISCELLANEOUS) ×1 IMPLANT
CLIP TI MEDIUM 6 (CLIP) IMPLANT
CLIP TI WIDE RED SMALL 6 (CLIP) IMPLANT
COVER PROBE W GEL 5X96 (DRAPES) ×1 IMPLANT
COVER READER SURGICOUNT DISP (BAG) IMPLANT
DERMABOND ADVANCED .7 DNX12 (GAUZE/BANDAGES/DRESSINGS) ×1 IMPLANT
DRAIN CHANNEL 15F RND FF W/TCR (WOUND CARE) IMPLANT
DRSG TEGADERM 4X4.75 (GAUZE/BANDAGES/DRESSINGS) IMPLANT
ELECT REM PT RETURN 9FT ADLT (ELECTROSURGICAL) ×1
ELECTRODE REM PT RTRN 9FT ADLT (ELECTROSURGICAL) ×1 IMPLANT
EVACUATOR SILICONE 100CC (DRAIN) IMPLANT
GAUZE SPONGE 4X4 12PLY STRL (GAUZE/BANDAGES/DRESSINGS) IMPLANT
GLOVE SURG SS PI 7.5 STRL IVOR (GLOVE) ×1 IMPLANT
GOWN STRL REUS W/ TWL LRG LVL3 (GOWN DISPOSABLE) ×2 IMPLANT
GOWN STRL REUS W/ TWL XL LVL3 (GOWN DISPOSABLE) ×1 IMPLANT
GOWN STRL REUS W/TWL LRG LVL3 (GOWN DISPOSABLE) ×2
GOWN STRL REUS W/TWL XL LVL3 (GOWN DISPOSABLE) ×1
HEMOSTAT SNOW SURGICEL 2X4 (HEMOSTASIS) IMPLANT
KIT BASIN OR (CUSTOM PROCEDURE TRAY) ×1 IMPLANT
KIT TURNOVER KIT B (KITS) ×1 IMPLANT
MARKER SKIN DUAL TIP RULER LAB (MISCELLANEOUS) ×1 IMPLANT
NDL 18GX1X1/2 (RX/OR ONLY) (NEEDLE) ×1 IMPLANT
NEEDLE 18GX1X1/2 (RX/OR ONLY) (NEEDLE) ×1 IMPLANT
NS IRRIG 1000ML POUR BTL (IV SOLUTION) ×2 IMPLANT
PACK CAROTID (CUSTOM PROCEDURE TRAY) ×1 IMPLANT
PAD ARMBOARD 7.5X6 YLW CONV (MISCELLANEOUS) ×2 IMPLANT
PENCIL BUTTON HOLSTER BLD 10FT (ELECTRODE) IMPLANT
POSITIONER HEAD DONUT 9IN (MISCELLANEOUS) ×1 IMPLANT
STAPLER VISISTAT 35W (STAPLE) IMPLANT
SURGIFLO W/THROMBIN 8M KIT (HEMOSTASIS) IMPLANT
SUT ETHIBOND CT1 BRD #0 30IN (SUTURE) ×2 IMPLANT
SUT ETHILON 3 0 PS 1 (SUTURE) IMPLANT
SUT PROLENE 4 0 RB 1 (SUTURE) ×1
SUT PROLENE 4-0 RB1 18X2 ARM (SUTURE) IMPLANT
SUT PROLENE 5 0 C 1 24 (SUTURE) IMPLANT
SUT PROLENE 6 0 BV (SUTURE) ×8 IMPLANT
SUT SILK 0 FSL (SUTURE) IMPLANT
SUT VIC AB 3-0 SH 27 (SUTURE) ×2
SUT VIC AB 3-0 SH 27X BRD (SUTURE) ×2 IMPLANT
SUT VICRYL 4-0 PS2 18IN ABS (SUTURE) ×2 IMPLANT
SYR 5ML LL (SYRINGE) ×1 IMPLANT
SYR BULB IRRIG 60ML STRL (SYRINGE) ×1 IMPLANT
TOWEL GREEN STERILE (TOWEL DISPOSABLE) ×1 IMPLANT
WATER STERILE IRR 1000ML POUR (IV SOLUTION) ×1 IMPLANT

## 2023-04-12 NOTE — Interval H&P Note (Signed)
History and Physical Interval Note:  04/12/2023 1:07 PM  Austin [name]  has presented today for surgery, with the diagnosis of CHRONIC KIDNEY DISEASE.  The various methods of treatment have been discussed with the patient and family. After consideration of risks, benefits and other options for treatment, the patient has consented to  Procedure(s): REMOVAL OF BAROSTIM (N/A) as a surgical intervention.  The patient's history has been reviewed, patient examined, no change in status, stable for surgery.  I have reviewed the patient's chart and labs.  Questions were answered to the patient's satisfaction.     Durene Cal

## 2023-04-12 NOTE — Progress Notes (Signed)
ANTICOAGULATION CONSULT NOTE - Follow Up  Pharmacy Consult for heparin Indication: atrial fibrillation and mechanical mitral valve   No Known Allergies  Patient Measurements: Height: 6\' 1"  (185.4 cm) Weight: 91.2 kg (201 lb 1 oz) IBW/kg (Calculated) : 79.9 Heparin Dosing Weight: 94.3 kg   Vital Signs: Temp: 98.3 F (36.8 C) (09/17 0343) Temp Source: Oral (09/17 0343) BP: 106/82 (09/17 0310) Pulse Rate: 105 (09/17 0310)  Labs: Recent Labs    04/10/23 0441 04/10/23 1405 04/10/23 1425 04/11/23 0434 04/11/23 1541 04/12/23 0316  HGB 9.4*  --   --  9.6*  --   --   HCT 30.8*  --   --  30.9*  --   --   PLT 128*  --   --  125*  --   --   LABPROT 22.3*  --   --  19.2*  --   --   INR 1.9*  --   --  1.6*  --   --   HEPARINUNFRC <0.10*  --    < > 0.13* 0.31 0.16*  CREATININE 2.29* 2.28*  --  2.70*  --   --    < > = values in this interval not displayed.    Estimated Creatinine Clearance: 24.7 mL/min (A) (by C-G formula based on SCr of 2.7 mg/dL (H)).   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   Conductive hearing loss    uses hearing aids   Dysrhythmia    A-fib   History of blood transfusion 03/24/2023   Hypothyroidism    Intraspinal abscess    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   Nonischemic cardiomyopathy (HCC)    Psychosexual dysfunction with inhibited sexual excitement    S/P mitral valve replacement    Syncope and collapse    Unspecified sleep apnea    last sleep study 11/07, uses cpap   Ventricular tachycardia (HCC)    VT (ventricular tachycardia) (HCC)     Medications:  Scheduled:   amoxicillin-clavulanate  1 tablet  Oral BID   Chlorhexidine Gluconate Cloth  6 each Topical Daily   doxycycline  100 mg Oral Q12H   levothyroxine  88 mcg Oral QAC breakfast   melatonin  5 mg Oral QHS   multivitamin with minerals  1 tablet Oral q AM   mupirocin ointment  1 Application Topical Daily   quiniDINE gluconate  324 mg Oral BID   sodium chloride flush  10-40 mL Intracatheter Q12H   sodium chloride flush  3 mL Intravenous Q12H    Assessment: 82 yom presenting after ICD shocks - on enoxaparin and warfarin PTA (LD 9/10 PM). PTA regimen 2.5 mg daily except 5 mg MWF. Currently undergoing bridge given recent admission for Barostim implant complicated with L chest wall hematoma requiring I&D - still requiring wound VAC changes.  INR down to 1.6 - off warfarin. Heparin level down to subtherapeutic (0.13) on 1250 units/hr. No s/sx of bleeding. Having issues getting peripheral sticks - very hard stick and they tried 2 times. Heparin was paused 25 min and flushed. Likely the levels are fluctuating due to having to hold heparin and then get levels so not  super accurate.  9/17 AM: heparin level 0.16 on heparin 1300 units/hr (subtherapeutic). No bleeding or issues with infusion reported per nursing. Lab drawn peripheral stick per RN. Last CBC shows Hgb and plts low, stable  Goal of Therapy:  INR 3-3.5  - when warfarin not on hold Heparin level: 0.3-0.5 Monitor platelets by anticoagulation protocol: Yes   Plan:  Increase heparin infusion to 1400 units/hr  8h heparin level Monitor daily HL, CBC, for s/sx of bleeding   Thank you for allowing pharmacy to participate in this patient's care,  Arabella Merles, PharmD. Clinical Pharmacist 04/12/2023 3:48 AM

## 2023-04-12 NOTE — Anesthesia Procedure Notes (Signed)
Procedure Name: Intubation Date/Time: 04/12/2023 3:52 PM  Performed by: Val Eagle, MDPre-anesthesia Checklist: Patient identified, Emergency Drugs available, Suction available and Patient being monitored Patient Re-evaluated:Patient Re-evaluated prior to induction Oxygen Delivery Method: Circle system utilized Preoxygenation: Pre-oxygenation with 100% oxygen Induction Type: IV induction Ventilation: Mask ventilation without difficulty Laryngoscope Size: McGraph Grade View: Grade I Tube type: Oral Tube size: 7.5 mm Number of attempts: 1 Airway Equipment and Method: Stylet and Oral airway Placement Confirmation: ETT inserted through vocal cords under direct vision, positive ETCO2 and breath sounds checked- equal and bilateral Secured at: 22 cm Tube secured with: Tape Dental Injury: Teeth and Oropharynx as per pre-operative assessment

## 2023-04-12 NOTE — Transfer of Care (Signed)
Immediate Anesthesia Transfer of Care Note  Patient: Austin [name]  Procedure(s) Performed: REMOVAL OF [name]  Patient Location: PACU  Anesthesia Type:General  Level of Consciousness: awake, alert , and oriented  Airway & Oxygen Therapy: Patient Spontanous Breathing  Post-op Assessment: Report given to RN and Post -op Vital signs reviewed and stable  Post vital signs: Reviewed and stable  Last Vitals:  Vitals Value Taken Time  BP 89/71 04/12/23 1815  Temp    Pulse 100 04/12/23 1820  Resp 19 04/12/23 1820  SpO2 94 % 04/12/23 1820  Vitals shown include unfiled device data.  Last Pain:  Vitals:   04/12/23 1200  TempSrc: Oral  PainSc: 0-No pain      Patients Stated Pain Goal: 0 (04/07/23 2000)  Complications: No notable events documented.  CBC ordered

## 2023-04-12 NOTE — Progress Notes (Addendum)
Rounding Note    Patient Name: Austin [name] Date of Encounter: 04/12/2023  [facility] Cardiologist: Dr. Shirlee Latch EP: Dr. Ladona Ridgel  Subjective   OOB to chair, tired, no dizzy or lightheaded, no CP, not aware of his VT   Inpatient Medications    Scheduled Meds:  amoxicillin-clavulanate  1 tablet Oral BID   Chlorhexidine Gluconate Cloth  6 each Topical Daily   doxycycline  100 mg Oral Q12H   levothyroxine  88 mcg Oral QAC breakfast   melatonin  5 mg Oral QHS   multivitamin with minerals  1 tablet Oral q AM   mupirocin ointment  1 Application Topical Daily   quiniDINE gluconate  324 mg Oral BID   sodium chloride flush  10-40 mL Intracatheter Q12H   sodium chloride flush  3 mL Intravenous Q12H   Continuous Infusions:  sodium chloride     amiodarone 60 mg/hr (04/12/23 0600)   furosemide (LASIX) 200 mg in dextrose 5 % 100 mL (2 mg/mL) infusion 12 mg/hr (04/12/23 0625)   heparin 1,400 Units/hr (04/12/23 0811)   PRN Meds: sodium chloride, acetaminophen, HYDROcodone-acetaminophen, nitroGLYCERIN, ondansetron (ZOFRAN) IV, polyethylene glycol, sodium chloride flush, sodium chloride flush   Vital Signs    Vitals:   04/12/23 0400 04/12/23 0500 04/12/23 0600 04/12/23 0630  BP: (!) 80/67 102/80 103/78   Pulse: (!) 104 (!) 105 (!) 104   Resp: (!) 21 16 (!) 21   Temp:      TempSrc:      SpO2: 93% 95% 92%   Weight:    91.4 kg  Height:        Intake/Output Summary (Last 24 hours) at 04/12/2023 0824 Last data filed at 04/12/2023 0600 Gross per 24 hour  Intake 1189.03 ml  Output 2070 ml  Net -880.97 ml      04/12/2023    6:30 AM 04/11/2023    5:00 AM 04/10/2023    6:00 AM  Last 3 Weights  Weight (lbs) 201 lb 8 oz 201 lb 1 oz 203 lb 4.2 oz  Weight (kg) 91.4 kg 91.2 kg 92.2 kg      Telemetry    AFib w/VT 101-105bpm > VP 100 - Personally Reviewed  ECG    No new EKGs - Personally Reviewed  Physical Exam   GEN: No acute distress.   Neck: No  JVD Cardiac: RRR, valve appreciated, rubs, or gallops.  Respiratory: crackles at the bases b/l. GI: Soft, nontender, non-distended  MS: marked edema remains; No deformity. Neuro:  Nonfocal  Psych: Normal affect   Labs    High Sensitivity Troponin:   Recent Labs  Lab 04/06/23 1026 04/06/23 1430  TROPONINIHS 64* 62*     Chemistry Recent Labs  Lab 04/06/23 1030 04/07/23 0247 04/09/23 0240 04/10/23 0441 04/10/23 1405 04/11/23 0434 04/12/23 0431  NA 138   < > 133* 132* 130* 134* 130*  K 3.6   < > 3.4* 3.2* 4.0 3.7 3.8  CL 106   < > 99 96* 95* 96* 92*  CO2 20*   < > 24 26 24 28 26   GLUCOSE 97   < > 113* 96 224* 132* 182*  BUN 28*   < > 33* 35* 35* 35* 33*  CREATININE 1.90*   < > 2.21* 2.29* 2.28* 2.70* 2.38*  CALCIUM 8.3*   < > 8.1* 8.2* 8.0* 8.3* 8.2*  MG 1.9   < > 2.1 2.0  --  1.9  --   PROT 5.9*  --   --   --   --   --   --  ALBUMIN 3.2*  --   --   --  3.0*  --   --   AST 30  --   --   --   --   --   --   ALT 24  --   --   --   --   --   --   ALKPHOS 78  --   --   --   --   --   --   BILITOT 1.4*  --   --   --   --   --   --   GFRNONAA 35*   < > 29* 28* 28* 23* 27*  ANIONGAP 12   < > 10 10 11 10 12    < > = values in this interval not displayed.    Lipids No results for input(s): "CHOL", "TRIG", "HDL", "LABVLDL", "LDLCALC", "CHOLHDL" in the last 168 hours.  Hematology Recent Labs  Lab 04/10/23 0441 04/11/23 0434 04/12/23 0431  WBC 7.8 6.4 6.5  RBC 3.05* 3.06* 3.14*  HGB 9.4* 9.6* 9.7*  HCT 30.8* 30.9* 31.4*  MCV 101.0* 101.0* 100.0  MCH 30.8 31.4 30.9  MCHC 30.5 31.1 30.9  RDW 19.6* 19.7* 19.8*  PLT 128* 125* 118*   Thyroid  Recent Labs  Lab 04/06/23 1006 04/06/23 1030  TSH 18.440*  --   FREET4  --  1.33*    BNP Recent Labs  Lab 04/06/23 1026  BNP 1,258.7*    DDimer No results for input(s): "DDIMER" in the last 168 hours.   Radiology    DG Chest Port 1 View Result Date: 04/09/2023 CLINICAL DATA:  PICC line placement EXAM: PORTABLE CHEST 1  VIEW COMPARISON:  04/06/2023 FINDINGS: Single frontal view of the chest demonstrates interval placement of a left-sided PICC, tip overlying superior vena cava. 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 Stable multi lead AICD and left-sided nerve stimulator. Cardiac silhouette is unchanged. Continued central vascular congestion, with patchy bibasilar consolidation right greater than left increased since prior study. No effusion or pneumothorax. No acute bony abnormalities. IMPRESSION: 1. Left-sided PICC as above. 2. Continued central vascular congestion, with progressive bibasilar consolidation favoring atelectasis. Electronically Signed   By: Sharlet Salina M.D.   On: 04/09/2023 17:49      Cardiac Studies    04/07/23: limited echo  1. Left ventricular ejection fraction, by estimation, is 20%. The left  ventricle has severely decreased function. The left ventricle demonstrates  global hypokinesis. No LV thrombus noted. Indeterminant diastolic  function.   2. Peak RV-RA gradient 19 mmHg. IVC not visualized. Right ventricular  systolic function is mildly reduced. The right ventricular size is normal.   3. Mechanical mitral valve with mean gradient 8 mmHg, increased from  prior. No mitral regurgitation noted though shadowing from prosthetic  valve makes this difficult to discern.   4. Tricuspid valve regurgitation is moderate.   5. The aortic valve is tricuspid. There is moderate calcification of the  aortic valve. Doppler evaluation of aortic valve not done.   6. Limited echo.   Patient Profile     82 y.o. male w/PMHx of  VT (ablation 2017), AFib, CHB, chronic CHF (systolic), VHD s/p MVR (mechanical, 2000), hypothyroidism (s/p thyroidectomy), NICM   Underwent barostim implant 03/17/23 >> complicated by chest wall hematoma >> I&D (x2)  > wound vac  Admitted with VT    Device information MDT CRT d ICD implanted 08/05/2011, gen change 05/04/2017 (has an LV lead in,  abandoned  2/2 high thresholds >with a dual chamber device in) + long hx of VT  VT ablation 09/15/2015 (in d/w Dr. Ladona Ridgel has had 4 in total)   AAD Amiodarone goes back to 2010 Quinidine started July 2023  Assessment & Plan    VT with ICD shock Perhaps provoked by volume OL Home quinidine Amiodarone gtt remains at 60 Pace terminated by Dr. Graciela Husbands Sunday >> VP at 100 without recurrent VT so far Pace terminated this morning by Dr. Ladona Ridgel ( )  RV paces a lot (CHB history) 89%  Plan will be to start to reduce pacing rate tomorrow and work on transitioning to PO amiodarone Lengthy discussion today with Dr. Ladona Ridgel Infection risk with adding LB lead far outweighs potential benefit and would not undertake with active wound chest anyway He has had several VT ablations with no plans to re-visit this   Acute on chronic systolic CHF EF 20-25% on Echo 09/28/2022 AHF team is on board Lasix gtt Creat down some 2.38 (baseline looks probably 1.8-2.0) Remains volume OL Picc now with b/l IV infiltrations      Barostim implantation 8/22 Complicated by hematoma and poorly healing site that has continued to drain. Vasc team on board >> removal tomorrow planned   Atrial flutter Longstanding persistent Afib Left sided flutter In AFlutter/AFib currently (noted via programmer/EGMs) Managed on coumadin for CHA2DS2VASc of at least 4.  Heparin gtt currently pending Barostim removal    Mechanical MVR Functioning well by prior Echo.  Consider repeat once stabilized.     For questions or updates, please contact [facility] Please consult [email or web address] for contact info under       Signed, Sheilah Pigeon, PA-C  04/12/2023, 8:24 AM    EP Attending  Patient seen and examined. Agree with above. He was back in a slow VT earlier today and has undergone ATP back out of the VT. He has undergone Barostim removal. We will follow. We will transition to oral amio, starting tomorrow if  keeping down oral therapy and consider getting back on outpatient diuretic therapy.   Loman Chroman Shyhiem Beeney,MD

## 2023-04-12 NOTE — Op Note (Signed)
Patient name: Austin [name] MRN: [id number] DOB: 02/21/1941 Sex: male  04/12/2023 Pre-operative Diagnosis: Left chest wall seroma, s/p Barostim implant Post-operative diagnosis:  Same Surgeon:  Durene Cal Assistants:  Adonis Housekeeper, PA Procedure:   #1: Removal of left sided Barostim device   #2: Redo left carotid artery exposure Anesthesia:  General Blood Loss:  200 cc Specimens: Generator was sent for culture as well as the wound VAC sponge  Findings: No gross evidence of infection.  The Barosim device was completely removed.  Indications: This is a 82 year old gentleman who recently had a left sided Barostim device implanted.  Because of prior pacemaker infections on the left side, I elected to place the generator behind the pectoralis muscle.  He then represented with a significant chest wall seroma.  This was washed out in the operating room and a wound VAC sponge was placed.  He returned to the hospital after having his pacemaker fire.  He was found to be severely volume overloaded.  He has decided to have the device removed.  Procedure:  The patient was identified in the holding area and taken to [facility] OR ROOM 16  The patient was then placed supine on the table. general anesthesia was administered.  The patient was prepped and draped in the usual sterile fashion.  A time out was called and antibiotics were administered.  A PA was necessary to expedite the procedure and assist with technical details.  She help with exposure by providing suction and retraction.  She helped with removal of the device as well as closure.  The patient's previous left neck incision was opened with a 10 blade.  Cautery was used divide subcutaneous tissue and platysma muscle.  The device was very scarred and already.  I sharply dissected this out.  I did encounter a injury to a unnamed vein which had to be oversewn with Prolene.  Ultimately, I was able to sharply dissect down to the common carotid artery and  removed the strain relief loop off of the common carotid artery.  I proceeded cephalad to identify the distal and the carotid bifurcation.  I was able to remove the sutures as well as the disc in its entirety.  I then transected the device and remove the electrode.  The carotid incision was then irrigated.  Surgiflo was used to help with hemostasis.  I then closed the platysma muscle with 3-0 Vicryl.  The PA closed the skin with 4-0 Vicryl.  Dermabond was applied.  We then turned our attention towards the chest wall incision.  The sponge from the wound VAC was removed and sent for culture.  I then exposed the generator.  I then remove the Ethibond sutures that were securing it.  The tubing and the generator were then completely removed and sent for culture.  The chest wall was then copiously irrigated.  I inspected the wound to make sure I did not see any obvious source of lymphatic leak which I did not.  I then brought a 15 Blake drain out through the pectoralis muscle laterally.  The drain was sutured into position with 3-0 nylon.  I then reapproximated the subcutaneous tissue with interrupted 2-0 Vicryl.  Staples were used to close the skin.  Sterile dressings were applied.  The there were no immediate complications.   Disposition: Successfully extubated and returned to the ICU in stable condition   V. Durene Cal, M.D., [facility] [place] Office: [phone] Pager:  [phone]

## 2023-04-12 NOTE — Progress Notes (Signed)
VASCULAR SURGERY:  Called to see patient because of the concern for ischemia of the right upper extremity.  On my exam the fingers are pink.  There is a biphasic radial and palmar arch signal with the Doppler.  There is no evidence of upper extremity ischemia.  There is significant swelling in the right arm.  Reportedly the patient has had a previous infiltrate.  I have instructed the nurse to elevate the arm.  If the swelling does not improve he will need an upper extremity venous duplex scan tomorrow.  Cari Caraway, MD 8:58 PM

## 2023-04-12 NOTE — Anesthesia Preprocedure Evaluation (Addendum)
Anesthesia Evaluation  Patient identified by MRN, date of birth, ID band Patient awake    Reviewed: Allergy & Precautions, H&P , NPO status , Patient's Chart, lab work & pertinent test results  History of Anesthesia Complications Negative for: history of anesthetic complications  Airway Mallampati: III  TM Distance: >3 FB Neck ROM: Full    Dental  (+) Teeth Intact, Dental Advisory Given   Pulmonary sleep apnea     + decreased breath sounds      Cardiovascular +CHF  + dysrhythmias Ventricular Tachycardia + Cardiac Defibrillator IV+ Valvular Problems/Murmurs  Rhythm:Regular Rate:Tachycardia   Left Ventricle: Left ventricular ejection fraction, by estimation, is  20%. The left ventricle has severely decreased function. The left  ventricle demonstrates global hypokinesis. Left ventricular diastolic  parameters are indeterminate.   Right Ventricle: Peak RV-RA gradient 19 mmHg. IVC not visualized. The  right ventricular size is normal. No increase in right ventricular wall  thickness. Right ventricular systolic function is mildly reduced.   Mitral Valve: Mechanical mitral valve with mean gradient 8 mmHg, increased  from prior. No mitral regurgitation noted though shadowing from prosthetic  valve makes this difficult to discern. There is a unknown size mechanical  valve present in the mitral  position. MV peak gradient, 10.4 mmHg. The mean mitral valve gradient is  8.0 mmHg.   Tricuspid Valve: The tricuspid valve is normal in structure. Tricuspid  valve regurgitation is moderate.   Aortic Valve: The aortic valve is tricuspid. There is moderate  calcification of the aortic valve.   Venous: The inferior vena cava was not well visualized.   Additional Comments: A device lead is visualized in the right ventricle.  Spectral Doppler performed. Color Doppler performed.     Neuro/Psych negative neurological ROS  negative psych ROS    GI/Hepatic negative GI ROS, Neg liver ROS,,,  Endo/Other  Hypothyroidism    Renal/GU Renal InsufficiencyRenal diseaseLab Results      Component                Value               Date                      NA                       130 (L)             04/12/2023                K                        3.8                 04/12/2023                CO2                      26                  04/12/2023                GLUCOSE                  182 (H)             04/12/2023  BUN                      33 (H)              04/12/2023                CREATININE               2.38 (H)            04/12/2023                CALCIUM                  8.2 (L)             04/12/2023                GFR                      26.83 (L)           09/27/2022                EGFR                     56 (L)              01/29/2021                GFRNONAA                 27 (L)              04/12/2023             negative genitourinary   Musculoskeletal negative musculoskeletal ROS (+)    Abdominal   Peds negative pediatric ROS (+)  Hematology  (+) Blood dyscrasia, anemia Heparin gtt, stopped on transport to OR  Lab Results      Component                Value               Date                      WBC                      6.5                 04/12/2023                HGB                      9.7 (L)             04/12/2023                HCT                      31.4 (L)            04/12/2023                MCV                      100.0               04/12/2023                PLT  118 (L)             04/12/2023              Anesthesia Other Findings VT, RV pacing at 100bpm  Reproductive/Obstetrics negative OB ROS                             Anesthesia Physical Anesthesia Plan  ASA: 4  Anesthesia Plan: General   Post-op Pain Management: Minimal or no pain anticipated   Induction: Intravenous  PONV Risk Score and Plan: 3 and  Ondansetron, Dexamethasone and Treatment may vary due to age or medical condition  Airway Management Planned: Oral ETT  Additional Equipment:   Intra-op Plan:   Post-operative Plan: Extubation in OR  Informed Consent: I have reviewed the patients History and Physical, chart, labs and discussed the procedure including the risks, benefits and alternatives for the proposed anesthesia with the patient or authorized representative who has indicated his/her understanding and acceptance.     Dental advisory given  Plan Discussed with: CRNA and Surgeon  Anesthesia Plan Comments: (Etomidate for induction)       Anesthesia Quick Evaluation

## 2023-04-12 NOTE — Progress Notes (Signed)
CCC Pre-op Review  Pre-op checklist:  not complete  NPO:   yes   Ca  Labs:  na 130  creat 2.38  Ca 8.2  PT / INR  17.2 / 1.4   H/H 9.7 / 31.4   PLTS  118*  Consent:  signed 9/16  H&P:  need s uodate  Vitals:  afeb  p 104  r 17  93/69  94% RA  O2 requirements:   RA  MAR/PTA review:  on po abx coverage  IV:   TL PICC  Floor nurse name:   Fleet Contras RN  Additional info:   on heparin gtt and Amiodarone gtt

## 2023-04-12 NOTE — Progress Notes (Addendum)
Patient ID: Austin [name], male   DOB: [birth year], 82 y.o.   MRN: [id number]     Advanced Heart Failure Rounding Note  PCP-Cardiologist: None   Subjective:    Patient was paced out of slow VT on 9/13.  Since then, has been in and out of slow VT.  Was out of VT on 9/14 but went back in slow VT 9/15 Back in slow VT for a couple of hours rate around 100.  When out of VT, he has had atrial tachycardia with intermittent RV pacing. EP saw and programmed ( RV lead only) to 100 . Switched to lasix drip and given metolazone. 9/16 Back in VT   Remains in VT this morning. Continue amiodarone gtt 60 mg/hr + quinidine.   Remains on lasix drip at 12 mg per hour. Made > 2 liters urine.   Creatinine 2.7>2.4   Earlier this morning nauseated and felt lightheaded.    Objective:   Weight Range: 91.4 kg Body mass index is 26.58 kg/m.   Vital Signs:   Temp:  [97.6 F (36.4 C)-98.3 F (36.8 C)] 98.3 F (36.8 C) (09/17 0343) Pulse Rate:  [98-189] 104 (09/17 0600) Resp:  [15-30] 21 (09/17 0600) BP: (80-129)/(59-90) 103/78 (09/17 0600) SpO2:  [78 %-100 %] 92 % (09/17 0600) Weight:  [91.4 kg] 91.4 kg (09/17 0630) Last BM Date : 04/11/23  Weight change: Filed Weights   04/10/23 0600 04/11/23 0500 04/12/23 0630  Weight: 92.2 kg 91.2 kg 91.4 kg    Intake/Output:   Intake/Output Summary (Last 24 hours) at 04/12/2023 0747 Last data filed at 04/12/2023 0600 Gross per 24 hour  Intake 1241.39 ml  Output 2270 ml  Net -1028.61 ml     CVP 11-13  Physical Exam   General:  In the chair.  No resp difficulty HEENT: normal Neck: supple. JVP 11-12 . Carotids 2+ bilat; no bruits. No lymphadenopathy or thryomegaly appreciated. Cor: PMI nondisplaced. Regular rate & rhythm. No rubs, gallops or murmurs.  Lungs: clear Abdomen: soft, nontender, nondistended. No hepatosplenomegaly. No bruits or masses. Good bowel sounds. Extremities: no cyanosis, clubbing, rash, R and LLE 1+edema Neuro: alert &  orientedx3, cranial nerves grossly intact. moves all 4 extremities w/o difficulty. Affect pleasant Skin: R upper chest VAC dressing.    Telemetry  Slow VT overnight. Paced out by EP. V Paced at 100 EKG    No new EKG to review   Labs    CBC Recent Labs    04/11/23 0434 04/12/23 0431  WBC 6.4 6.5  HGB 9.6* 9.7*  HCT 30.9* 31.4*  MCV 101.0* 100.0  PLT 125* 118*   Basic Metabolic Panel Recent Labs    78/29/56 0441 04/10/23 1405 04/11/23 0434 04/12/23 0431  NA 132* 130* 134* 130*  K 3.2* 4.0 3.7 3.8  CL 96* 95* 96* 92*  CO2 26 24 28 26   GLUCOSE 96 224* 132* 182*  BUN 35* 35* 35* 33*  CREATININE 2.29* 2.28* 2.70* 2.38*  CALCIUM 8.2* 8.0* 8.3* 8.2*  MG 2.0  --  1.9  --   PHOS  --  4.0  --   --    Liver Function Tests Recent Labs    04/10/23 1405  ALBUMIN 3.0*    No results for input(s): "LIPASE", "AMYLASE" in the last 72 hours. Cardiac Enzymes No results for input(s): "CKTOTAL", "CKMB", "CKMBINDEX", "TROPONINI" in the last 72 hours.  BNP: BNP (last 3 results) Recent Labs    02/03/23 1502 03/23/23 1045 04/06/23 1026  BNP 453.3* 352.5* 1,258.7*    ProBNP (last 3 results) Recent Labs    12/16/22 0959  PROBNP 3,692*     D-Dimer No results for input(s): "DDIMER" in the last 72 hours. Hemoglobin A1C No results for input(s): "HGBA1C" in the last 72 hours. Fasting Lipid Panel No results for input(s): "CHOL", "HDL", "LDLCALC", "TRIG", "CHOLHDL", "LDLDIRECT" in the last 72 hours. Thyroid Function Tests No results for input(s): "TSH", "T4TOTAL", "T3FREE", "THYROIDAB" in the last 72 hours.  Invalid input(s): "FREET3"   Other results:   Imaging    No results found.   Medications:     Scheduled Medications:  amoxicillin-clavulanate  1 tablet Oral BID   Chlorhexidine Gluconate Cloth  6 each Topical Daily   doxycycline  100 mg Oral Q12H   levothyroxine  88 mcg Oral QAC breakfast   melatonin  5 mg Oral QHS   multivitamin with minerals  1  tablet Oral q AM   mupirocin ointment  1 Application Topical Daily   quiniDINE gluconate  324 mg Oral BID   sodium chloride flush  10-40 mL Intracatheter Q12H   sodium chloride flush  3 mL Intravenous Q12H    Infusions:  sodium chloride     amiodarone 60 mg/hr (04/12/23 0600)   furosemide (LASIX) 200 mg in dextrose 5 % 100 mL (2 mg/mL) infusion 12 mg/hr (04/12/23 0625)   heparin 1,400 Units/hr (04/12/23 0600)    PRN Medications: sodium chloride, acetaminophen, HYDROcodone-acetaminophen, nitroGLYCERIN, ondansetron (ZOFRAN) IV, polyethylene glycol, sodium chloride flush, sodium chloride flush    Patient Profile   82 y/o male w/ 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    Now readmitted for ICD shocks for VT and a/c CHF.  Assessment/Plan   1. VT: History of VT and VT ablation in 2017.  MDT ICD.  Redo VT ablation in 1/23 (Dr. Marquette Saa).  He is on amiodarone, this was increased to 200 mg daily as outpatient with recurrent VT.  He has tolerated amiodarone poorly historically but is doing ok with it today.  In 7/23, slow VT was pace-terminated in the office, then he had an ICD shock for VT later in 7/23.  He was started on quinidine after this.  He is followed by Dr. Ladona Ridgel.  Now with 2 further ICD shocks for VT prior to this admission, had been in slow VT rate 100s-110s this admission.  Paced out of VT 9/13 by EP, since then has been in and out of slow VT.  Over the weekend he went back in slow VT. EP programmed ( RV lead only) to 100  - Back in VT overnight. Paced out by EP.  - Continue amiodarone gtt 60 mg/hr  - Continue quinidine.  2. Acute on chronic systolic CHF: Long-standing, suspected nonischemic cardiomyopathy.  Had RHC on  02/26/21 with normal filling pressures and mildly low cardiac output.  Echo in 4/23 showed  EF 20-25% with diffuse hypokinesis and septal-lateral dyssynchrony, mildly decreased RV systolic function, mechanical MV with normal function, dilated IVC.  He has a [employer] CRT-D device present, but the LV lead is not functional.  He has complete heart block, so has been RV paced chronically with a wide QRS.  Has had difficulty tolerating GDMT historically with CKD and hypotension.  Currently off  his home GDMT due to low BP and elevated creatinine.  Echo this admission with EF 20%, mechanical MV mean gradient 8 mmHg.   - CVP 11-12. Continue lasix drip 12 mg per hour. Give 2.5 mg metolazone.   Creatinine 2.32 => 2.21 => 2.29=>2.7=>2.4   CO-OX 50% will repeat now that he is out of VT.  - Spironolactone, valsartan, and Toprol XL on hold for now with low SBP. In the 90s. May need to add Norepi if <90.  - Off digoxin due to persistently elevated level.  - He did not tolerate SGLT2 inhibitor in the past.  - The patient has CHB and has been chronically RV pacing as his LV lead is nonfunctional.  This is not ideal and creates dyssynchrony with a very wide QRS.  Left bundle lead placement discussed in past with Dr. Ladona Ridgel but thought risk would be too high and left bundle lead was not attempted.   - Barostimulator device placed but complicated by chest wall hematoma as below and is going to need to be removed.  3. Atrial fibrillation/flutter, atrial tachycardia: Paroxysmal.  This admission, when out of VT, he is in atrial tachycardia.  - Continues on amiodarone.  - On Heparin drip.  - Consider DCCV this admission to get him out of AT and back in NSR.  4. Complete heart block: Patient is pacer dependent due to CHB.  - See above, upgrade to functional CRT thought to be too risky due to need to extract old CS lead. 5. OSA: Severe. Continue CPAP 6. Mechanical mitral valve: Mean gradient higher on echo this admission at 8  mmHg.  - Heparin gtt and off warfarin for now.  - After barostimulator is out, restart warfarin for INR goal 2.5-3.5.   -INR 1.4  7. Chest Wall Hematoma: post barostimulator complication, in setting of coumadin for mechanical MV.  s/p I&D for evacuation of hematoma and placement of wound vac + placement of antibiotic beads on 8/30.  - On Augmentin.  - wound vac management per [name]  - Planning barostim removal today.  - Warfarin on hold, on heparin gtt.  8. AKI on CKD stage 3: Creatinine baseline 1.5-2.  2.32 => 2.21 => 2.29=>2.7=>2.38  today.  Making over 2.2 liters of urine.    Length of Stay: 6  Amy Clegg, NP  04/12/2023, 7:47 AM  Advanced Heart Failure Team Pager [phone] (M-F; 7a - 5p)  Please contact [facility] for night-coverage after hours (5p -7a ) and weekends on [email or web address]  Patient seen with NP, agree with the above note.   He was back in slow VT 100s this morning, pace-terminated by Dr. Ladona Ridgel.  Now back to RV pacing.   Some diuresis yesterday on Lasix gtt, I/Os net negative 1029.  CVP 12-13 with prominent peripheral edema. Creatinine lower at 2.38.  Co-ox low today at 50% but in setting of VT this morning.   General: NAD Neck:JVP 12-14 cm, no thyromegaly or thyroid nodule.  Lungs: Decreased at bases. CV: Nondisplaced PMI.  Heart regular S1/S2, no S3/S4, no murmur. 1+ edema to knees.  Abdomen: Soft, nontender, no hepatosplenomegaly, no distention.  Skin: Intact without lesions or rashes.  Neurologic: Alert and oriented x 3.  Psych: Normal affect. Extremities: No clubbing or cyanosis.  HEENT: Normal.   He will go to the OR today for barostimulator removal.  Will hold heparin gtt for procedure, will need to resume heparin/warfarin afterwards for mechanical MV.   Creatinine lower today, CVP remains  12-13 with prominent edema.  Continue Lasix 12 mg/hr and will give metolazone 2.5 mg x 1 today.  Avoid milrinone with frequent VT. Co-ox low at 50% but this was  while in VT.   Still having episodes of slow VT rate 100s.  Co-ox drops with VT.  He was paced out of VT yesterday and again today, now RV pacing set for 100 bpm.  At baseline, he RV paces most of the time.  His underlying atrial rhythm appears to be atrial tachycardia.  - Continue reloading amiodarone 60 mg/hr  - Continue quinidine.  - No other medication options.  - Dr. Pilar Jarvis following.   CRITICAL CARE Performed by: Marca Ancona  Total critical care time: 40 minutes  Critical care time was exclusive of separately billable procedures and treating other patients.  Critical care was necessary to treat or prevent imminent or life-threatening deterioration.  Critical care was time spent personally by me on the following activities: development of treatment plan with patient and/or surrogate as well as nursing, discussions with consultants, evaluation of patient's response to treatment, examination of patient, obtaining history from patient or surrogate, ordering and performing treatments and interventions, ordering and review of laboratory studies, ordering and review of radiographic studies, pulse oximetry and re-evaluation of patient's condition.  Marca Ancona 04/12/2023 9:11 AM

## 2023-04-13 ENCOUNTER — Ambulatory Visit: Payer: Medicare Other | Admitting: Student

## 2023-04-13 ENCOUNTER — Encounter (HOSPITAL_COMMUNITY): Payer: Self-pay | Admitting: Surgery

## 2023-04-13 DIAGNOSIS — I472 Ventricular tachycardia, unspecified: Secondary | ICD-10-CM | POA: Diagnosis not present

## 2023-04-13 DIAGNOSIS — I5043 Acute on chronic combined systolic (congestive) and diastolic (congestive) heart failure: Secondary | ICD-10-CM

## 2023-04-13 LAB — COOXEMETRY PANEL
Carboxyhemoglobin: 2.1 % — ABNORMAL HIGH (ref 0.5–1.5)
Carboxyhemoglobin: 2.2 % — ABNORMAL HIGH (ref 0.5–1.5)
Carboxyhemoglobin: 2.6 % — ABNORMAL HIGH (ref 0.5–1.5)
Methemoglobin: <0.7 % (ref 0.0–1.5)
Methemoglobin: 0.8 % (ref 0.0–1.5)
Methemoglobin: 0.9 % (ref 0.0–1.5)
O2 Saturation: 48.5 %
O2 Saturation: 55.5 %
O2 Saturation: 66.1 %
Total hemoglobin: 10.7 g/dL — ABNORMAL LOW (ref 12.0–16.0)
Total hemoglobin: 11 g/dL — ABNORMAL LOW (ref 12.0–16.0)
Total hemoglobin: 10.5 g/dL — ABNORMAL LOW (ref 12.0–16.0)

## 2023-04-13 LAB — PROTIME-INR
INR: 1.5 — ABNORMAL HIGH (ref 0.8–1.2)
Prothrombin Time: 18.6 seconds — ABNORMAL HIGH (ref 11.4–15.2)

## 2023-04-13 LAB — BASIC METABOLIC PANEL WITH GFR
Anion gap: 14 (ref 5–15)
Anion gap: 14 (ref 5–15)
BUN: 36 mg/dL — ABNORMAL HIGH (ref 8–23)
BUN: 40 mg/dL — ABNORMAL HIGH (ref 8–23)
CO2: 25 mmol/L (ref 22–32)
CO2: 24 mmol/L (ref 22–32)
Calcium: 8.7 mg/dL — ABNORMAL LOW (ref 8.9–10.3)
Calcium: 8.6 mg/dL — ABNORMAL LOW (ref 8.9–10.3)
Chloride: 91 mmol/L — ABNORMAL LOW (ref 98–111)
Chloride: 90 mmol/L — ABNORMAL LOW (ref 98–111)
Creatinine, Ser: 2.68 mg/dL — ABNORMAL HIGH (ref 0.61–1.24)
Creatinine, Ser: 3.01 mg/dL — ABNORMAL HIGH (ref 0.61–1.24)
GFR, Estimated: 23 mL/min — ABNORMAL LOW (ref >60)
GFR, Estimated: 20 mL/min — ABNORMAL LOW (ref >60)
Glucose, Bld: 254 mg/dL — ABNORMAL HIGH (ref 70–99)
Glucose, Bld: 284 mg/dL — ABNORMAL HIGH (ref 70–99)
Potassium: 5.7 mmol/L — ABNORMAL HIGH (ref 3.5–5.1)
Potassium: 5.1 mmol/L (ref 3.5–5.1)
Sodium: 130 mmol/L — ABNORMAL LOW (ref 135–145)
Sodium: 128 mmol/L — ABNORMAL LOW (ref 135–145)

## 2023-04-13 MED ORDER — WARFARIN SODIUM 2.5 MG PO TABS
2.5000 mg | ORAL_TABLET | Freq: Once | ORAL | Status: AC
  Administered 2023-04-13: 2.5 mg via ORAL
  Filled 2023-04-13: qty 1

## 2023-04-13 MED ORDER — WARFARIN - PHARMACIST DOSING INPATIENT
Freq: Every day | Status: DC
  Administered 2023-04-18 – 2023-04-19 (×2): 1

## 2023-04-13 NOTE — Anesthesia Postprocedure Evaluation (Signed)
Anesthesia Post Note  Patient: Austin [name]  Procedure(s) Performed: REMOVAL OF BAROSTIM     Patient location during evaluation: PACU Anesthesia Type: General Level of consciousness: awake and alert Pain management: pain level controlled Vital Signs Assessment: post-procedure vital signs reviewed and stable Respiratory status: spontaneous breathing, nonlabored ventilation and respiratory function stable Cardiovascular status: blood pressure returned to baseline and stable Postop Assessment: no apparent nausea or vomiting Anesthetic complications: no   No notable events documented.  Last Vitals:  Vitals:   04/13/23 1618 04/13/23 1630  BP: 120/79   Pulse: 89   Resp: 17   Temp:  36.4 C  SpO2: 93%     Last Pain:  Vitals:   04/13/23 1630  TempSrc: Oral  PainSc:    Pain Goal: Patients Stated Pain Goal: 0 (04/12/23 2242)                 [name]

## 2023-04-13 NOTE — Progress Notes (Addendum)
  Progress Note    04/13/2023 8:30 AM 1 Day Post-Op  Subjective:  feels like he got hit by a truck. Right arm is not hurting anymore    Vitals:   04/13/23 0600 04/13/23 0630  BP: 125/80   Pulse: 100   Resp: 19   Temp:    SpO2: 95% 94%    Physical Exam: General:  sitting up comfortably in the chair Cardiac:  ventricular paced at 100 Lungs:  nonlabored Incisions:  L sided neck incision soft without hematoma. L sided chest incision soft and intact with dry dressing. L sided chest wall drain with 45cc output Extremities:  bilateral hands warm with intact motor and sensation. Biphasic radial doppler signals. No appreciable edema in upper extremities  CBC    Component Value Date/Time   WBC 6.7 04/13/2023 0445   RBC 3.32 (L) 04/13/2023 0445   HGB 10.1 (L) 04/13/2023 0445   HGB 16.6 12/04/2020 1445   HCT 33.8 (L) 04/13/2023 0445   HCT 50.1 12/04/2020 1445   PLT 113 (L) 04/13/2023 0445   PLT 158 12/04/2020 1445   MCV 101.8 (H) 04/13/2023 0445   MCV 94 12/04/2020 1445   MCH 30.4 04/13/2023 0445   MCHC 29.9 (L) 04/13/2023 0445   RDW 19.8 (H) 04/13/2023 0445   RDW 13.7 12/04/2020 1445   LYMPHSABS 0.3 (L) 04/06/2023 1030   MONOABS 1.1 (H) 04/06/2023 1030   EOSABS 0.1 04/06/2023 1030   BASOSABS 0.1 04/06/2023 1030    BMET    Component Value Date/Time   NA 130 (L) 04/13/2023 0445   NA 139 01/29/2021 0853   K 5.7 (H) 04/13/2023 0445   CL 91 (L) 04/13/2023 0445   CO2 25 04/13/2023 0445   GLUCOSE 254 (H) 04/13/2023 0445   BUN 36 (H) 04/13/2023 0445   BUN 18 01/29/2021 0853   CREATININE 2.68 (H) 04/13/2023 0445   CREATININE 1.22 (H) 09/12/2015 0846   CALCIUM 8.7 (L) 04/13/2023 0445   GFRNONAA 23 (L) 04/13/2023 0445   GFRAA 56 (L) 04/30/2020 1144    INR    Component Value Date/Time   INR 1.5 (H) 04/13/2023 0445     Intake/Output Summary (Last 24 hours) at 04/13/2023 0830 Last data filed at 04/13/2023 0700 Gross per 24 hour  Intake 2164.03 ml  Output 1635 ml   Net 529.03 ml      Assessment/Plan:  82 y.o. male is 1 day post op, s/p: barostim removal and drain placement   -Not feeling great this morning. Feels very weak and continues to go in and out of slow VT. Currently V paced at 100 -Hgb stable at 10.1. Minimal blood loss during surgery yesterday -Neurovascularly intact. L sided neck and chest incisions are soft without hematoma. L chest wall incision bandaged.  -L chest wall JP drain with 45cc output since surgery. This drain will stay in until it has 0cc output -Right arm pain has gone away. No significant edema on exam. Brisk right biphasic doppler signal.  -On heparin gtt. Okay to transition back to home warfarin  [name], PA-C [facility] [phone] 04/13/2023 8:30 AM   I agree with the above.  I have seen and evaluated the patient.  Will keep JP in place until drainage resolved.  Durene Cal

## 2023-04-13 NOTE — Evaluation (Signed)
Physical Therapy Evaluation Patient Details Name: Austin [name] MRN: [id number] DOB: 08/25/1940 Today's Date: 04/13/2023  History of Present Illness  Pt is an 82 y.o. male who presented 04/06/23 with ICD shocks. Of note, pt underwent Barostim device 03/17/23. S/p removal of L sided Barostim device and redo L carotid artery exposure 9/17. PMH: Mechanical MV s/p MV endocarditis, VT, NICM, CHB and PAF/AFL, CKD, CHF, syncope, sleep apnea   Clinical Impression  Pt presents with condition above and deficits mentioned below, see PT Problem List. Pt was utilizing a rollator for community mobility and no AD inside his home for functional mobility at baseline. Pt resides with his wife in an ILF. Currently, pt demonstrates deficits in balance, activity tolerance, power, and strength and is at risk for falls. He may benefit from measuring orthostatics as he appeared to get lightheaded with mobility today. He required minA for bed mobility and transfers and min-modA to ambulate with a RW this date. Pt and family report preference for pt to go to an inpatient rehab facility, < 3 hours/day, prior to return to his ILF to maximize his safety and independence with all functional mobility. He really could benefit from this. Will continue to follow acutely.        If plan is discharge home, recommend the following: A lot of help with walking and/or transfers;A lot of help with bathing/dressing/bathroom;Assistance with cooking/housework;Assist for transportation   Can travel by private vehicle   Yes    Equipment Recommendations Other (comment) (defer to next venue of care)  Recommendations for Other Services       Functional Status Assessment Patient has had a recent decline in their functional status and demonstrates the ability to make significant improvements in function in a reasonable and predictable amount of time.     Precautions / Restrictions Precautions Precautions:  Fall;ICD/Pacemaker Precaution Comments: JP drain L chest; watch BP Restrictions Weight Bearing Restrictions: No      Mobility  Bed Mobility Overal bed mobility: Needs Assistance Bed Mobility: Rolling, Sidelying to Sit, Sit to Supine Rolling: Min assist Sidelying to sit: Min assist, HOB elevated   Sit to supine: Min assist, HOB elevated   General bed mobility comments: Cues to not pull/push through L UE so much. MinA to roll to R and ascend trunk. MinA to lift and direct legs to bed to return to supine    Transfers Overall transfer level: Needs assistance Equipment used: Rolling walker (2 wheels) Transfers: Sit to/from Stand Sit to Stand: Min assist           General transfer comment: MinA to power up to stand and gain balance, 1x from EOB and 1x from chair. Cues for hand placement provided    Ambulation/Gait Ambulation/Gait assistance: Min assist, Mod assist Gait Distance (Feet): 150 Feet (x2 bouts of ~150 ft > ~100 ft) Assistive device: Rolling walker (2 wheels) Gait Pattern/deviations: Step-through pattern, Decreased step length - right, Decreased step length - left, Decreased stride length, Trunk flexed, Drifts right/left Gait velocity: reduced Gait velocity interpretation: <1.8 ft/sec, indicate of risk for recurrent falls   General Gait Details: Pt ambulated with a flexed posture, needing cues to improve his posture and stay proximal within his RW. Pt began to sway/drift laterally, lose control of his RW, and lean posteriorly as distance progressed the first bout, needing up to modA to maintain balance while chair was brought behind him for him to sit. Unsure if his BP dropped as pt reported feeling odd in the  head with SBP in 100s (no starting BP to compare to as pt had restricted extremity bracelets on limbs, RN then notified PT once pt sitting after ambulating to use L forearm as this is the least at risk extremity and this is the limb they had been using). Pt with  improved stability during second bout, minA for balance.  Stairs            Wheelchair Mobility     Tilt Bed    Modified Rankin (Stroke Patients Only)       Balance Overall balance assessment: Needs assistance Sitting-balance support: No upper extremity supported, Feet supported Sitting balance-Leahy Scale: Fair     Standing balance support: Bilateral upper extremity supported, During functional activity, Reliant on assistive device for balance Standing balance-Leahy Scale: Poor Standing balance comment: Reliant on RW and external support                             Pertinent Vitals/Pain Pain Assessment Pain Assessment: Faces Faces Pain Scale: Hurts little more Pain Location: L chest at incision site Pain Descriptors / Indicators: Discomfort, Grimacing, Guarding, Operative site guarding Pain Intervention(s): Limited activity within patient's tolerance, Monitored during session, Repositioned    Home Living Family/patient expects to be discharged to:: Assisted living (ILF) Living Arrangements: Spouse/significant other               Home Equipment: Rollator (4 wheels) Additional Comments: Family reports plan is for SNF prior to return to ILF    Prior Function Prior Level of Function : Independent/Modified Independent             Mobility Comments: Used rollator for community mobility but no device inside his home       Extremity/Trunk Assessment   Upper Extremity Assessment Upper Extremity Assessment: Defer to OT evaluation    Lower Extremity Assessment Lower Extremity Assessment: Generalized weakness       Communication   Communication Communication: No apparent difficulties  Cognition Arousal: Alert Behavior During Therapy: WFL for tasks assessed/performed Overall Cognitive Status: Within Functional Limits for tasks assessed                                          General Comments General comments (skin  integrity, edema, etc.): SBP in 100s sitting after ambulating and pt reporting feeling odd in the head, no starting BP to compare to as pt had restricted extremity bracelets on limbs, RN then notified PT once pt sitting after ambulating to use L forearm as this is the least at risk extremity and this is the limb they had been using; enocuraged walking >/= 3x/day while here    Exercises     Assessment/Plan    PT Assessment Patient needs continued PT services  PT Problem List Decreased strength;Decreased activity tolerance;Decreased balance;Decreased mobility;Decreased knowledge of precautions;Cardiopulmonary status limiting activity;Pain       PT Treatment Interventions DME instruction;Gait training;Functional mobility training;Therapeutic activities;Therapeutic exercise;Balance training;Neuromuscular re-education;Patient/family education    PT Goals (Current goals can be found in the Care Plan section)  Acute Rehab PT Goals Patient Stated Goal: to improve PT Goal Formulation: With patient/family Time For Goal Achievement: 04/27/23 Potential to Achieve Goals: Good    Frequency Min 1X/week     Co-evaluation  AM-PAC PT "6 Clicks" Mobility  Outcome Measure Help needed turning from your back to your side while in a flat bed without using bedrails?: A Little Help needed moving from lying on your back to sitting on the side of a flat bed without using bedrails?: A Little Help needed moving to and from a bed to a chair (including a wheelchair)?: A Little Help needed standing up from a chair using your arms (e.g., wheelchair or bedside chair)?: A Little Help needed to walk in hospital room?: A Lot Help needed climbing 3-5 steps with a railing? : Total 6 Click Score: 15    End of Session Equipment Utilized During Treatment: Gait belt Activity Tolerance: Patient tolerated treatment well Patient left: in bed;with call bell/phone within reach;with bed alarm set Nurse  Communication: Mobility status;Other (comment) (BP) PT Visit Diagnosis: Other abnormalities of gait and mobility (R26.89);Unsteadiness on feet (R26.81);Muscle weakness (generalized) (M62.81);Difficulty in walking, not elsewhere classified (R26.2);Pain Pain - Right/Left: Left Pain - part of body:  (chest)    Time: 7253-6644 PT Time Calculation (min) (ACUTE ONLY): 33 min   Charges:   PT Evaluation $PT Eval Moderate Complexity: 1 Mod PT Treatments $Gait Training: 8-22 mins PT General Charges $$ ACUTE PT VISIT: 1 Visit         Virgil Benedict, PT, DPT Acute Rehabilitation Services  Office: [phone]   Bettina Gavia 04/13/2023, 5:42 PM

## 2023-04-13 NOTE — Progress Notes (Addendum)
Patient ID: Austin [name], male   DOB: 1940/08/09, 82 y.o.   MRN: [id number]     Advanced Heart Failure Rounding Note  PCP-Cardiologist: None   Subjective:    Patient was paced out of slow VT on 9/13.  Since then, has been in and out of slow VT.  Was out of VT on 9/14 but went back in slow VT 9/15 Back in slow VT for a couple of hours rate around 100.  When out of VT, he has had atrial tachycardia with intermittent RV pacing. EP saw and programmed ( RV lead only) to 100 . Switched to lasix drip and given metolazone. 9/16 Back in VT  9/17 VT Paced out. S/P Barostem removal.   Remains in VT this morning. Continue amiodarone gtt 60 mg/hr + quinidine.   Remains on lasix drip 12 mg per hour.   Creatinine 2.7>2.4 >2.68. I/O not accurate.   Overnight complaining of R arm pain. Vascular evaluated. Able to doppler pulses. Plan to elevate RUE.   Feels terrible.    Objective:   Weight Range: 90.4 kg Body mass index is 26.29 kg/m.   Vital Signs:   Temp:  [97.2 F (36.2 C)-98.5 F (36.9 C)] 98.5 F (36.9 C) (09/18 0319) Pulse Rate:  [98-102] 100 (09/18 0600) Resp:  [12-32] 19 (09/18 0600) BP: (88-153)/(47-98) 125/80 (09/18 0600) SpO2:  [73 %-98 %] 94 % (09/18 0630) Weight:  [90.4 kg] 90.4 kg (09/18 0630) Last BM Date : 04/12/23  Weight change: Filed Weights   04/11/23 0500 04/12/23 0630 04/13/23 0630  Weight: 91.2 kg 91.4 kg 90.4 kg    Intake/Output:   Intake/Output Summary (Last 24 hours) at 04/13/2023 0747 Last data filed at 04/13/2023 0700 Gross per 24 hour  Intake 2217.38 ml  Output 1635 ml  Net 582.38 ml     CVP 15 Physical Exam   General: In the chair. No resp difficulty HEENT: normal Neck: supple. JVP 11-12. Carotids 2+ bilat; no bruits. No lymphadenopathy or thryomegaly appreciated. Cor: PMI nondisplaced. Regular rate & rhythm. No rubs, gallops or murmurs. L upper chest dressing CDI+ JP drain with serosang drainage.   Lungs: clear Abdomen: soft,  nontender, nondistended. No hepatosplenomegaly. No bruits or masses. Good bowel sounds. Extremities: no cyanosis, clubbing, rash, edema. R and LUE ecchymotic.  Neuro: alert & orientedx3, cranial nerves grossly intact. moves all 4 extremities w/o difficulty. Affect pleasant  Telemetry  V Paced 100 EKG    No new EKG to review   Labs    CBC Recent Labs    04/12/23 1818 04/13/23 0445  WBC 6.7 6.7  HGB 10.0* 10.1*  HCT 33.5* 33.8*  MCV 100.6* 101.8*  PLT 117* 113*   Basic Metabolic Panel Recent Labs    40/98/11 1405 04/11/23 0434 04/12/23 0429 04/12/23 0431 04/13/23 0445  NA 130* 134*  --  130* 130*  K 4.0 3.7  --  3.8 5.7*  CL 95* 96*  --  92* 91*  CO2 24 28  --  26 25  GLUCOSE 224* 132*  --  182* 254*  BUN 35* 35*  --  33* 36*  CREATININE 2.28* 2.70*  --  2.38* 2.68*  CALCIUM 8.0* 8.3*  --  8.2* 8.7*  MG  --  1.9 2.2  --   --   PHOS 4.0  --   --   --   --    Liver Function Tests Recent Labs    04/10/23 1405  ALBUMIN 3.0*  No results for input(s): "LIPASE", "AMYLASE" in the last 72 hours. Cardiac Enzymes No results for input(s): "CKTOTAL", "CKMB", "CKMBINDEX", "TROPONINI" in the last 72 hours.  BNP: BNP (last 3 results) Recent Labs    02/03/23 1502 03/23/23 1045 04/06/23 1026  BNP 453.3* 352.5* 1,258.7*    ProBNP (last 3 results) Recent Labs    12/16/22 0959  PROBNP 3,692*     D-Dimer No results for input(s): "DDIMER" in the last 72 hours. Hemoglobin A1C No results for input(s): "HGBA1C" in the last 72 hours. Fasting Lipid Panel No results for input(s): "CHOL", "HDL", "LDLCALC", "TRIG", "CHOLHDL", "LDLDIRECT" in the last 72 hours. Thyroid Function Tests No results for input(s): "TSH", "T4TOTAL", "T3FREE", "THYROIDAB" in the last 72 hours.  Invalid input(s): "FREET3"   Other results:   Imaging    No results found.   Medications:     Scheduled Medications:  amoxicillin-clavulanate  1 tablet Oral BID   Chlorhexidine Gluconate  Cloth  6 each Topical Daily   doxycycline  100 mg Oral Q12H   levothyroxine  88 mcg Oral QAC breakfast   melatonin  5 mg Oral QHS   multivitamin with minerals  1 tablet Oral q AM   mupirocin ointment  1 Application Topical Daily   potassium chloride  40 mEq Oral Once   quiniDINE gluconate  324 mg Oral BID   sodium chloride flush  10-40 mL Intracatheter Q12H   sodium chloride flush  3 mL Intravenous Q12H    Infusions:  sodium chloride     amiodarone 60 mg/hr (04/13/23 0700)   furosemide (LASIX) 200 mg in dextrose 5 % 100 mL (2 mg/mL) infusion 12 mg/hr (04/13/23 0700)   heparin 1,400 Units/hr (04/13/23 0700)    PRN Medications: sodium chloride, acetaminophen, HYDROcodone-acetaminophen, nitroGLYCERIN, ondansetron (ZOFRAN) IV, polyethylene glycol, sodium chloride flush, sodium chloride flush    Patient Profile   82 y/o male w/ 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    Now readmitted for ICD shocks for VT and a/c CHF.  Assessment/Plan   1. VT: History of VT and VT ablation in 2017.  MDT ICD.  Redo VT ablation in 1/23 (Dr. Marquette Saa).  He is on amiodarone, this was increased to 200 mg daily as outpatient with recurrent VT.  He has tolerated amiodarone poorly historically but is doing ok with it today.  In 7/23, slow VT was pace-terminated in the office, then he had an ICD shock for VT later in 7/23.  He was started on quinidine after this.  He is followed by Dr. Ladona Ridgel.  Now with 2 further ICD shocks for VT prior to this admission, had been in slow VT rate 100s-110s this admission.  Paced out of VT 9/13 by EP, since then has been in and out of slow VT.  Over the weekend and yesterday in slow VT. EP programmed ( RV lead only) to 100   - V paced.   - Continue amiodarone gtt 60 mg/hr  - Continue quinidine.  2. Acute on chronic systolic CHF: Long-standing, suspected nonischemic cardiomyopathy.  Had RHC on 02/26/21 with normal filling pressures and mildly low cardiac output.  Echo in 4/23 showed  EF 20-25% with diffuse hypokinesis and septal-lateral dyssynchrony, mildly decreased RV systolic function, mechanical MV with normal function,  dilated IVC.  He has a [employer] CRT-D device present, but the LV lead is not functional.  He has complete heart block, so has been RV paced chronically with a wide QRS.  Has had difficulty tolerating GDMT historically with CKD and hypotension.  Currently off his home GDMT due to low BP and elevated creatinine.  Echo this admission with EF 20%, mechanical MV mean gradient 8 mmHg.   - CVP 15 . Worsening renal function. Continue lasix drip 12 mg per hour. Hold metolazone.  - K running high. Stop K supplement.    Creatinine 2.7=>2.4=>2.7  - CO-OX 48.5%.   - Spironolactone, valsartan, and Toprol XL on hold for now with low SBP. In the 90s. May need to add Norepi if <90.  - Off digoxin due to persistently elevated level.  - He did not tolerate SGLT2 inhibitor in the past.  - The patient has CHB and has been chronically RV pacing as his LV lead is nonfunctional.  This is not ideal and creates dyssynchrony with a very wide QRS.  Left bundle lead placement discussed in past with Dr. Ladona Ridgel but thought risk would be too high and left bundle lead was not attempted.   - Barostimulator device placed but complicated by chest wall hematoma as below and is going to need to be removed.  3. Atrial fibrillation/flutter, atrial tachycardia: Paroxysmal.  This admission, when out of VT, he is in atrial tachycardia.  - Continues on amiodarone.  - On Heparin drip.  - Consider DCCV this admission to get him out of AT and back in NSR.  4. Complete heart block: Patient is pacer dependent due to CHB.  - See above, upgrade to  functional CRT thought to be too risky due to need to extract old CS lead. 5. OSA: Severe. Continue CPAP 6. Mechanical mitral valve: Mean gradient higher on echo this admission at 8 mmHg.  - Heparin gtt  - Restart warfarin for INR goal 2.5-3.5.  Ok with Vascular.  -INR 1.5  7. Chest Wall Hematoma: post barostimulator complication, in setting of coumadin for mechanical MV.  s/p I&D for evacuation of hematoma and placement of wound vac + placement of antibiotic beads on 8/30.  - On Augmentin.  - wound vac management per [name]  - 9/17 S/P barostim  -Discussed Warfarin with Vascular. Continue heparin gtt and restart warfarin.   8. AKI on CKD stage 3: Creatinine baseline 1.5-2.  2.7=>2.38=>2.68  today.  9. Hyperkalemia -K 5.7  - Stop potassium supplement.  - Repeat BMET in this afternoon.    Consult OT.  Length of Stay: 7  Amy Clegg, NP  04/13/2023, 7:47 AM  Advanced Heart Failure Team Pager [phone] (M-F; 7a - 5p)  Please contact [facility] for night-coverage after hours (5p -7a ) and weekends on [email or web address]  Patient seen with NP, agree with the above note.   Feels "bad," weak this morning.  Had significant right arm pain earlier that has resolved.  He has stayed out of VT.  CVP 13 on my read, co-ox low at 49% early am.   General: NAD Neck: JVP 12-14 cm, no thyromegaly or thyroid nodule.  Lungs: Clear to auscultation bilaterally with normal respiratory effort. CV: Nondisplaced PMI.  Heart regular S1/S2, no S3/S4, no murmur.  2+ edema at ankles.  Abdomen: Soft, nontender, no hepatosplenomegaly, no distention.  Skin: Intact without lesions or rashes.  Neurologic: Alert and oriented x 3.  Psych: Normal affect. Extremities: No clubbing or cyanosis.  HEENT: Normal.   Barostimulator device is out, on heparin gtt and warfarin to restart (INR goal 2.5-3.5 with mechanical MV).    Weight down 2 lbs with diuresis yesterday but creatinine now up 2.38 => 2.68, CVP remains 13 with  prominent edema.  Co-ox low at 49%.  Resend co-ox, but trying to avoid inotropes with frequent VT.  May be able to use low dose norepinephrine if needed.  Continue Lasix 12 mg/hr today but will not give metolazone.  Needs further diuresis but will allow creatinine to stabilize before pushing with metolazone again.     Has been having frequent episodes of slow VT rate 100s.  Co-ox drops with VT.  He was paced out of VT yesterday and the day before, now RV pacing set for 100 bpm.  At baseline, he RV paces most of the time.  His underlying atrial rhythm appears to be atrial tachycardia.  - Continue reloading amiodarone 60 mg/hr  - Continue quinidine.  - No other medication options.  - Per EP, decreased pacing rate to 90 today and will decrease to 80 tomorrow if stable.   CRITICAL CARE Performed by: Marca Ancona  Total critical care time: 35 minutes  Critical care time was exclusive of separately billable procedures and treating other patients.  Critical care was necessary to treat or prevent imminent or life-threatening deterioration.  Critical care was time spent personally by me on the following activities: development of treatment plan with patient and/or surrogate as well as nursing, discussions with consultants, evaluation of patient's response to treatment, examination of patient, obtaining history from patient or surrogate, ordering and performing treatments and interventions, ordering and review of laboratory studies, ordering and review of radiographic studies, pulse oximetry and re-evaluation of patient's condition.

## 2023-04-13 NOTE — TOC Progression Note (Signed)
Transition of Care [facility]) - Progression Note    Patient Details  Name: Austin [name] MRN: [id number] Date of Birth: 09-18-1940  Transition of Care [facility]) CM/SW Contact  Nicanor Bake Phone Number: [phone] 04/13/2023, 3:37 PM  Clinical Narrative:  HF CSW called [facility] admissions and left a VM  with Marcelino Duster asking to be called back in reference to a bed being placed on hold. CSW will follow up with [facility] if no one calls back about bed options.   TOC will continue following.     Expected Discharge Plan: Home w Home Health Services Barriers to Discharge: Continued Medical Work up  Expected Discharge Plan and Services     Post Acute Care Choice: Home Health Living arrangements for the past 2 months: Independent Living Facility                                       Social Determinants of Health (SDOH) Interventions SDOH Screenings   Food Insecurity: No Food Insecurity (04/06/2023)  Housing: Low Risk  (04/06/2023)  Transportation Needs: No Transportation Needs (04/06/2023)  Utilities: Not At Risk (04/06/2023)  Alcohol Screen: Low Risk  (03/08/2023)  Depression (PHQ2-9): Low Risk  (03/08/2023)  Financial Resource Strain: Low Risk  (03/08/2023)  Physical Activity: Insufficiently Active (03/08/2023)  Social Connections: Moderately Integrated (03/08/2023)  Stress: No Stress Concern Present (03/08/2023)  Tobacco Use: Low Risk  (04/06/2023)  Health Literacy: Adequate Health Literacy (03/08/2023)    Readmission Risk Interventions     No data to display

## 2023-04-13 NOTE — Plan of Care (Signed)

## 2023-04-13 NOTE — Progress Notes (Signed)
Orthopedic Tech Progress Note Patient Details:  Austin [name] [birth year] [id number]  Ortho Devices Type of Ortho Device: Roland Rack boot Ortho Device/Splint Location: BI LE unna boot Ortho Device/Splint Interventions: Ordered, Application   Post Interventions Patient Tolerated: Well Instructions Provided: Care of device  Tonye Pearson 04/13/2023, 2:40 PM

## 2023-04-13 NOTE — Progress Notes (Signed)
Rounding Note    Patient Name: Austin [name] Date of Encounter: 04/13/2023  [facility] Cardiologist: Dr. Shirlee Latch EP: Dr. Ladona Ridgel  Subjective   OOB to chair, tired, not dizzy or lightheaded, feels more weak today, arms still swollen   Inpatient Medications    Scheduled Meds:  amoxicillin-clavulanate  1 tablet Oral BID   Chlorhexidine Gluconate Cloth  6 each Topical Daily   doxycycline  100 mg Oral Q12H   levothyroxine  88 mcg Oral QAC breakfast   melatonin  5 mg Oral QHS   multivitamin with minerals  1 tablet Oral q AM   mupirocin ointment  1 Application Topical Daily   quiniDINE gluconate  324 mg Oral BID   sodium chloride flush  10-40 mL Intracatheter Q12H   sodium chloride flush  3 mL Intravenous Q12H   Continuous Infusions:  sodium chloride     amiodarone 60 mg/hr (04/13/23 0700)   furosemide (LASIX) 200 mg in dextrose 5 % 100 mL (2 mg/mL) infusion 12 mg/hr (04/13/23 0700)   heparin 1,400 Units/hr (04/13/23 0700)   PRN Meds: sodium chloride, acetaminophen, HYDROcodone-acetaminophen, nitroGLYCERIN, ondansetron (ZOFRAN) IV, polyethylene glycol, sodium chloride flush, sodium chloride flush   Vital Signs    Vitals:   04/13/23 0500 04/13/23 0600 04/13/23 0630 04/13/23 0800  BP: (!) 136/98 125/80    Pulse: 98 100    Resp: (!) 25 19    Temp:    (!) 97.5 F (36.4 C)  TempSrc:    Oral  SpO2: 93% 95% 94%   Weight:   90.4 kg   Height:        Intake/Output Summary (Last 24 hours) at 04/13/2023 0902 Last data filed at 04/13/2023 0700 Gross per 24 hour  Intake 2110.65 ml  Output 1335 ml  Net 775.65 ml      04/13/2023    6:30 AM 04/12/2023    6:30 AM 04/11/2023    5:00 AM  Last 3 Weights  Weight (lbs) 199 lb 4.7 oz 201 lb 8 oz 201 lb 1 oz  Weight (kg) 90.4 kg 91.4 kg 91.2 kg      Telemetry    VP 100 > 90 - Personally Reviewed  ECG    No new EKGs - Personally Reviewed  Physical Exam   GEN: No acute distress.   Neck: No JVD Cardiac:  RRR, valve appreciated, rubs, or gallops.  Respiratory: soft crackles at the bases b/l. GI: Soft, nontender, non-distended  MS: b/l LE edema remains; No deformity. Neuro:  Nonfocal  Psych: Normal affect   Labs    High Sensitivity Troponin:   Recent Labs  Lab 04/06/23 1026 04/06/23 1430  TROPONINIHS 64* 62*     Chemistry Recent Labs  Lab 04/06/23 1030 04/07/23 0247 04/10/23 0441 04/10/23 1405 04/11/23 0434 04/12/23 0429 04/12/23 0431 04/13/23 0445  NA 138   < > 132* 130* 134*  --  130* 130*  K 3.6   < > 3.2* 4.0 3.7  --  3.8 5.7*  CL 106   < > 96* 95* 96*  --  92* 91*  CO2 20*   < > 26 24 28   --  26 25  GLUCOSE 97   < > 96 224* 132*  --  182* 254*  BUN 28*   < > 35* 35* 35*  --  33* 36*  CREATININE 1.90*   < > 2.29* 2.28* 2.70*  --  2.38* 2.68*  CALCIUM 8.3*   < > 8.2*  8.0* 8.3*  --  8.2* 8.7*  MG 1.9   < > 2.0  --  1.9 2.2  --   --   PROT 5.9*  --   --   --   --   --   --   --   ALBUMIN 3.2*  --   --  3.0*  --   --   --   --   AST 30  --   --   --   --   --   --   --   ALT 24  --   --   --   --   --   --   --   ALKPHOS 78  --   --   --   --   --   --   --   BILITOT 1.4*  --   --   --   --   --   --   --   GFRNONAA 35*   < > 28* 28* 23*  --  27* 23*  ANIONGAP 12   < > 10 11 10   --  12 14   < > = values in this interval not displayed.    Lipids No results for input(s): "CHOL", "TRIG", "HDL", "LABVLDL", "LDLCALC", "CHOLHDL" in the last 168 hours.  Hematology Recent Labs  Lab 04/12/23 0431 04/12/23 1818 04/13/23 0445  WBC 6.5 6.7 6.7  RBC 3.14* 3.33* 3.32*  HGB 9.7* 10.0* 10.1*  HCT 31.4* 33.5* 33.8*  MCV 100.0 100.6* 101.8*  MCH 30.9 30.0 30.4  MCHC 30.9 29.9* 29.9*  RDW 19.8* 19.8* 19.8*  PLT 118* 117* 113*   Thyroid  Recent Labs  Lab 04/06/23 1006 04/06/23 1030  TSH 18.440*  --   FREET4  --  1.33*    BNP Recent Labs  Lab 04/06/23 1026  BNP 1,258.7*    DDimer No results for input(s): "DDIMER" in the last 168 hours.   Radiology    DG Chest  Port 1 View Result Date: 04/09/2023 CLINICAL DATA:  PICC line placement EXAM: PORTABLE CHEST 1 VIEW COMPARISON:  04/06/2023 FINDINGS: Single frontal view of the chest demonstrates interval placement of a left-sided PICC, tip overlying superior vena cava. 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 Stable multi lead AICD and left-sided nerve stimulator. Cardiac silhouette is unchanged. Continued central vascular congestion, with patchy bibasilar consolidation right greater than left increased since prior study. No effusion or pneumothorax. No acute bony abnormalities. IMPRESSION: 1. Left-sided PICC as above. 2. Continued central vascular congestion, with progressive bibasilar consolidation favoring atelectasis. Electronically Signed   By: Sharlet Salina M.D.   On: 04/09/2023 17:49      Cardiac Studies    04/07/23: limited echo  1. Left ventricular ejection fraction, by estimation, is 20%. The left  ventricle has severely decreased function. The left ventricle demonstrates  global hypokinesis. No LV thrombus noted. Indeterminant diastolic  function.   2. Peak RV-RA gradient 19 mmHg. IVC not visualized. Right ventricular  systolic function is mildly reduced. The right ventricular size is normal.   3. Mechanical mitral valve with mean gradient 8 mmHg, increased from  prior. No mitral regurgitation noted though shadowing from prosthetic  valve makes this difficult to discern.   4. Tricuspid valve regurgitation is moderate.   5. The aortic valve is tricuspid. There is moderate calcification of the  aortic valve. Doppler evaluation of aortic valve not done.   6. Limited echo.  Patient Profile     82 y.o. male w/PMHx of  VT (ablation 2017), AFib, CHB, chronic CHF (systolic), VHD s/p MVR (mechanical, 2000), hypothyroidism (s/p thyroidectomy), NICM   Underwent barostim implant 03/17/23 >> complicated by chest wall hematoma >> I&D (x2)  > wound vac  Admitted with VT     Device information MDT CRT d ICD implanted 08/05/2011, gen change 05/04/2017 (has an LV lead in, abandoned  2/2 high thresholds >with a dual chamber device in) + long hx of VT  VT ablation 09/15/2015 (in d/w Dr. Ladona Ridgel has had 4 in total)   AAD Amiodarone goes back to 2010 Quinidine started July 2023  Assessment & Plan    VT with ICD shock Perhaps provoked by volume OL Home quinidine 324mg  BID Amiodarone gtt remains at 60 Pace terminated by Dr. Graciela Husbands Sunday >> VP at 100 without recurrent VT so far Pace terminated 04/12/23 morning by Dr. Ladona Ridgel ( )  RV paces a lot (CHB history) 89%  Plan will be to start to reduce pacing rate today and work on transitioning to PO amiodarone after we have reached a target HR (probably 80) Lengthy discussion yesterday with Dr. Ladona Ridgel Infection risk with adding LB lead far outweighs potential benefit and would not undertake with active wound chest anyway He has had several VT ablations with no plans to re-visit this  No EP procedures planned Base pacing rate reduced to 90 this am Keep amio at 60 for now Plan to reduce rate to 80 tomorrow EGMs via his device appears to be a ST with some tracking. Dr. Jimmey Ralph suspects he remains in an atrial arrhythmia (falling in blanking)   Acute on chronic systolic CHF EF 20-25% on Echo 09/28/2022 AHF team is on board/much appreciated Lasix gtt Creat was down some 2.38 yesterday >> 2.68 today (baseline looks probably 1.8-2.0) K+ 5.7 , supplementation stopped Lasix gtt Remains volume OL Picc now with b/l IV infiltrations RUE swelling/pain, evaluated by VVS last night Not found to have any vascular compromise Advised elevation      Barostim implantation 8/22 Complicated by hematoma and poorly healing site that has continued to drain. Vasc team on board >> removed yesterday No procedural complications   Atrial flutter Longstanding persistent Afib Left sided flutter In AFlutter/AFib currently  (noted via programmer/EGMs) Managed on coumadin for CHA2DS2VASc of at least 4.  Heparin gtt currently pending Barostim removal Plan to resume warfarin when cleared by VVS    Mechanical MVR Functioning well by prior Echo.  Heparin gtt > warfarin as above    For questions or updates, please contact [facility] Please consult [email or web address] for contact info under       Signed, Sheilah Pigeon, PA-C  04/13/2023, 9:02 AM

## 2023-04-13 NOTE — TOC Progression Note (Signed)
Transition of Care [facility]) - Progression Note    Patient Details  Name: Austin [name] MRN: [id number] Date of Birth: 1941-05-19  Transition [facility] Care [facility]) CM/SW Contact  Elliot Cousin, RN Phone Number: [phone] 04/13/2023, 3:00 PM  Clinical Narrative:   CM spoke to pt's dtr, Morrie Sheldon and pt is wanting to go to the [facility] rehab. PT is scheduled to start working with pt tomorrow. Will have CSW follow up on bed availability at [facility]. Pt has CPAP at home, [place] community.     Expected Discharge Plan: Home w Home Health Services Barriers to Discharge: Continued Medical Work up  Expected Discharge Plan and Services     Post Acute Care Choice: Home Health Living arrangements for the past 2 months: Independent Living Facility                                       Social Determinants of Health (SDOH) Interventions SDOH Screenings   Food Insecurity: No Food Insecurity (04/06/2023)  Housing: Low Risk  (04/06/2023)  Transportation Needs: No Transportation Needs (04/06/2023)  Utilities: Not At Risk (04/06/2023)  Alcohol Screen: Low Risk  (03/08/2023)  Depression (PHQ2-9): Low Risk  (03/08/2023)  Financial Resource Strain: Low Risk  (03/08/2023)  Physical Activity: Insufficiently Active (03/08/2023)  Social Connections: Moderately Integrated (03/08/2023)  Stress: No Stress Concern Present (03/08/2023)  Tobacco Use: Low Risk  (04/06/2023)  Health Literacy: Adequate Health Literacy (03/08/2023)    Readmission Risk Interventions     No data to display

## 2023-04-13 NOTE — Progress Notes (Signed)
ANTICOAGULATION CONSULT NOTE - Follow Up  Pharmacy Consult for heparin Indication: atrial fibrillation and mechanical mitral valve   No Known Allergies  Patient Measurements: Height: 6\' 1"  (185.4 cm) Weight: 90.4 kg (199 lb 4.7 oz) IBW/kg (Calculated) : 79.9 Heparin Dosing Weight: 94.3 kg   Vital Signs: Temp: 97.5 F (36.4 C) (09/18 0800) Temp Source: Oral (09/18 0800) BP: 125/80 (09/18 0600) Pulse Rate: 100 (09/18 0600)  Labs: Recent Labs    04/11/23 0434 04/11/23 1541 04/12/23 0316 04/12/23 0431 04/12/23 1818 04/13/23 0445 04/13/23 0831  HGB 9.6*  --   --  9.7* 10.0* 10.1*  --   HCT 30.9*  --   --  31.4* 33.5* 33.8*  --   PLT 125*  --   --  118* 117* 113*  --   LABPROT 19.2*  --   --  17.2*  --  18.6*  --   INR 1.6*  --   --  1.4*  --  1.5*  --   HEPARINUNFRC 0.13* 0.31 0.16*  --   --   --  0.31  CREATININE 2.70*  --   --  2.38*  --  2.68*  --     Estimated Creatinine Clearance: 24.8 mL/min (A) (by C-G formula based on SCr of 2.68 mg/dL (H)).   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   Conductive hearing loss    uses hearing aids   Dysrhythmia    A-fib   History of blood transfusion 03/24/2023   Hypothyroidism    Intraspinal abscess    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   Nonischemic cardiomyopathy (HCC)    Psychosexual dysfunction with inhibited sexual excitement    S/P mitral valve replacement    Syncope and collapse    Unspecified sleep apnea    last sleep study 11/07, uses cpap   Ventricular tachycardia (HCC)    VT (ventricular tachycardia) (HCC)     Medications:  Scheduled:   amoxicillin-clavulanate  1  tablet Oral BID   Chlorhexidine Gluconate Cloth  6 each Topical Daily   doxycycline  100 mg Oral Q12H   levothyroxine  88 mcg Oral QAC breakfast   melatonin  5 mg Oral QHS   multivitamin with minerals  1 tablet Oral q AM   mupirocin ointment  1 Application Topical Daily   quiniDINE gluconate  324 mg Oral BID   sodium chloride flush  10-40 mL Intracatheter Q12H   sodium chloride flush  3 mL Intravenous Q12H   warfarin  2.5 mg Oral ONCE-1600   Warfarin - Pharmacist Dosing Inpatient   Does not apply q1600    Assessment: 82 yom presenting after ICD shocks - on enoxaparin and warfarin PTA (LD 9/10 PM). PTA regimen 2.5 mg daily except 5 mg MWF. Currently undergoing bridge given recent admission for Barostim implant complicated with L chest wall hematoma requiring I&D > barostim removed 9/17 and vac removed   INR down to 1.5 with warfarin held for procedures. Heparin level 0.31 this am at goal, on heparin drip 1400 uts/hr.  No s/sx of bleeding. Stable cbc  Procedures complete per VVS and EP > ok to resume warfarin  low dose    Goal of Therapy:  INR 3-3.5  - when warfarin not on hold Heparin level: 0.3-0.5 Monitor platelets by anticoagulation protocol: Yes   Plan:  Continue heparin infusion 1400 units/hr  Warfarin 2.5mg  x1  Monitor daily Heparin level, protime, CBC, for s/sx of bleeding    Leota Sauers Pharm.D. CPP, BCPS Clinical Pharmacist [phone] 04/13/2023 10:07 AM

## 2023-04-14 DIAGNOSIS — I5023 Acute on chronic systolic (congestive) heart failure: Secondary | ICD-10-CM | POA: Diagnosis not present

## 2023-04-14 DIAGNOSIS — I472 Ventricular tachycardia, unspecified: Secondary | ICD-10-CM | POA: Diagnosis not present

## 2023-04-14 LAB — CBC
HCT: 30.8 % — ABNORMAL LOW (ref 39.0–52.0)
Hemoglobin: 9.4 g/dL — ABNORMAL LOW (ref 13.0–17.0)
MCH: 30.2 pg (ref 26.0–34.0)
MCHC: 30.5 g/dL (ref 30.0–36.0)
MCV: 99 fL (ref 80.0–100.0)
Platelets: 101 10*3/uL — ABNORMAL LOW (ref 150–400)
RBC: 3.11 MIL/uL — ABNORMAL LOW (ref 4.22–5.81)
RDW: 18.9 % — ABNORMAL HIGH (ref 11.5–15.5)
WBC: 11.2 10*3/uL — ABNORMAL HIGH (ref 4.0–10.5)
nRBC: 0.2 % (ref 0.0–0.2)

## 2023-04-14 LAB — BASIC METABOLIC PANEL
Anion gap: 12 (ref 5–15)
BUN: 45 mg/dL — ABNORMAL HIGH (ref 8–23)
CO2: 26 mmol/L (ref 22–32)
Calcium: 8.2 mg/dL — ABNORMAL LOW (ref 8.9–10.3)
Chloride: 89 mmol/L — ABNORMAL LOW (ref 98–111)
Creatinine, Ser: 3.02 mg/dL — ABNORMAL HIGH (ref 0.61–1.24)
GFR, Estimated: 20 mL/min — ABNORMAL LOW (ref >60)
Glucose, Bld: 182 mg/dL — ABNORMAL HIGH (ref 70–99)
Potassium: 4.6 mmol/L (ref 3.5–5.1)
Sodium: 127 mmol/L — ABNORMAL LOW (ref 135–145)

## 2023-04-14 LAB — HEPARIN LEVEL (UNFRACTIONATED)
Heparin Unfractionated: 1.03 IU/mL — ABNORMAL HIGH (ref 0.30–0.70)
Heparin Unfractionated: 0.34 IU/mL (ref 0.30–0.70)

## 2023-04-14 LAB — COOXEMETRY PANEL
Carboxyhemoglobin: 2.1 % — ABNORMAL HIGH (ref 0.5–1.5)
Methemoglobin: <0.7 % (ref 0.0–1.5)
O2 Saturation: 59.6 %
Total hemoglobin: 9.9 g/dL — ABNORMAL LOW (ref 12.0–16.0)

## 2023-04-14 LAB — PROTIME-INR
INR: 1.6 — ABNORMAL HIGH (ref 0.8–1.2)
Prothrombin Time: 19.4 seconds — ABNORMAL HIGH (ref 11.4–15.2)

## 2023-04-14 MED ORDER — WARFARIN SODIUM 5 MG PO TABS
5.0000 mg | ORAL_TABLET | Freq: Once | ORAL | Status: AC
  Administered 2023-04-14: 5 mg via ORAL
  Filled 2023-04-14: qty 1

## 2023-04-14 MED ORDER — WARFARIN SODIUM 2.5 MG PO TABS: 2.5000 mg | ORAL_TABLET | Freq: Once | ORAL | Status: DC

## 2023-04-14 MED ORDER — AMIODARONE HCL 200 MG PO TABS
400.0000 mg | ORAL_TABLET | Freq: Two times a day (BID) | ORAL | Status: DC
  Administered 2023-04-14 – 2023-04-22 (×17): 400 mg via ORAL
  Filled 2023-04-14 (×17): qty 2

## 2023-04-14 NOTE — Progress Notes (Addendum)
  Progress Note    04/14/2023 7:52 AM 2 Days Post-Op  Subjective:  feeling much better this morning    Vitals:   04/14/23 0600 04/14/23 0620  BP:  117/83  Pulse: 90 90  Resp: 17 20  Temp:    SpO2: 96% 96%    Physical Exam: General:  sitting up in the chair eating breakfast Lungs:  nonlabored Incisions:  L chest wall incision bandaged and dry. L chest wall drain with 55cc.L sided neck incision soft   CBC    Component Value Date/Time   WBC 11.2 (H) 04/14/2023 0444   RBC 3.11 (L) 04/14/2023 0444   HGB 9.4 (L) 04/14/2023 0444   HGB 16.6 12/04/2020 1445   HCT 30.8 (L) 04/14/2023 0444   HCT 50.1 12/04/2020 1445   PLT 101 (L) 04/14/2023 0444   PLT 158 12/04/2020 1445   MCV 99.0 04/14/2023 0444   MCV 94 12/04/2020 1445   MCH 30.2 04/14/2023 0444   MCHC 30.5 04/14/2023 0444   RDW 18.9 (H) 04/14/2023 0444   RDW 13.7 12/04/2020 1445   LYMPHSABS 0.3 (L) 04/06/2023 1030   MONOABS 1.1 (H) 04/06/2023 1030   EOSABS 0.1 04/06/2023 1030   BASOSABS 0.1 04/06/2023 1030    BMET    Component Value Date/Time   NA 127 (L) 04/14/2023 0444   NA 139 01/29/2021 0853   K 4.6 04/14/2023 0444   CL 89 (L) 04/14/2023 0444   CO2 26 04/14/2023 0444   GLUCOSE 182 (H) 04/14/2023 0444   BUN 45 (H) 04/14/2023 0444   BUN 18 01/29/2021 0853   CREATININE 3.02 (H) 04/14/2023 0444   CREATININE 1.22 (H) 09/12/2015 0846   CALCIUM 8.2 (L) 04/14/2023 0444   GFRNONAA 20 (L) 04/14/2023 0444   GFRAA 56 (L) 04/30/2020 1144    INR    Component Value Date/Time   INR 1.6 (H) 04/14/2023 0444     Intake/Output Summary (Last 24 hours) at 04/14/2023 0752 Last data filed at 04/14/2023 0700 Gross per 24 hour  Intake 1315.38 ml  Output 1135 ml  Net 180.38 ml      Assessment/Plan:  82 y.o. male is 2 days post op, s/p: barostim removal   -Feeling much better this morning. Feels like he has more energy -No pain at left chest wall incision site. Site is dry and bandaged. L chest wall drain with  55cc output in 24 hours, will remain -L sided neck incision dry and soft   Austin Dubonnet, PA-C [facility] [phone] 04/14/2023 7:52 AM   JP drainage, 55 cc x 24 hours  Austin [name]

## 2023-04-14 NOTE — TOC Progression Note (Signed)
Transition of Care [facility] - Progression Note    Patient Details  Name: Austin [name] MRN: [id number] Date of Birth: 1941/03/02  Transition of Care [facility]) CM/SW Contact  Nicanor Bake Phone Number: [phone] 04/14/2023, 1:20 PM  Clinical Narrative:   HF CSW received a call from Amber at [phone] inquiring about the pts dc date. CSW explained that the date was still unknown. Amber stated that they still have a bed available.   TOC will continue following.     Expected Discharge Plan: Home w Home Health Services Barriers to Discharge: Continued Medical Work up  Expected Discharge Plan and Services     Post Acute Care Choice: Home Health Living arrangements for the past 2 months: Independent Living Facility                                       Social Determinants of Health (SDOH) Interventions SDOH Screenings   Food Insecurity: No Food Insecurity (04/06/2023)  Housing: Low Risk  (04/06/2023)  Transportation Needs: No Transportation Needs (04/06/2023)  Utilities: Not At Risk (04/06/2023)  Alcohol Screen: Low Risk  (03/08/2023)  Depression (PHQ2-9): Low Risk  (03/08/2023)  Financial Resource Strain: Low Risk  (03/08/2023)  Physical Activity: Insufficiently Active (03/08/2023)  Social Connections: Moderately Integrated (03/08/2023)  Stress: No Stress Concern Present (03/08/2023)  Tobacco Use: Low Risk  (04/06/2023)  Health Literacy: Adequate Health Literacy (03/08/2023)    Readmission Risk Interventions     No data to display

## 2023-04-14 NOTE — NC FL2 (Signed)
[place] MEDICAID FL2 LEVEL OF CARE FORM     IDENTIFICATION  Patient Name: Austin [name] Birthdate: 06-Sep-1940 Sex: male Admission Date (Current Location): 04/06/2023  [facility] and [place] Number:  Producer, television/film/video and Address:  [facility] [place], [address] [place], [place] [address]      Provider Number: [id number]  Attending Physician Name and Address:  Laurey Morale, MD  Relative Name and Phone Number:  [name], daughter   [phone]    Current Level of Care: SNF Recommended Level of Care: Skilled Nursing Facility Prior Approval Number:    Date Approved/Denied:   PASRR Number: [id number]  Discharge Plan: SNF    Current Diagnoses: Patient Active Problem List   Diagnosis Date Noted   Heart failure (HCC) 04/06/2023   Fluid collection at surgical site, initial encounter 03/23/2023   Callous ulcer, limited to breakdown of skin (HCC) 09/27/2022   OSA on CPAP 09/27/2022   Dyspnea 07/05/2022   Anemia due to zinc deficiency 05/25/2022   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   Stage 3b chronic kidney disease (HCC) 05/17/2022   Need for immunization against influenza 05/17/2022   Chronic anticoagulation 10/14/2018   Ventricular tachycardia (HCC) 10/12/2018   Neuropathy 08/25/2018   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   Nonischemic cardiomyopathy (HCC)    Subtherapeutic international normalized ratio (INR) 01/10/2016   S/P ablation of ventricular arrhythmia 01/10/2016   H/O mitral valve replacement with mechanical valve    Encounter for therapeutic drug monitoring 08/20/2013   Cardiac device, implant, or graft infection or inflammation (HCC) 06/28/2011   ICD (implantable cardioverter-defibrillator) in place 06/02/2009   Chronic systolic heart failure (HCC) 04/15/2009   ERECTILE DYSFUNCTION 11/27/2008   HEARING LOSS 11/27/2008   Atrial fibrillation (HCC) 11/27/2008    SLEEP APNEA 11/27/2008    Orientation RESPIRATION BLADDER Height & Weight     Self, Time, Situation, Place  O2 (2L) Incontinent Weight: 201 lb 11.5 oz (91.5 kg) Height:  6\' 1"  (185.4 cm)  BEHAVIORAL SYMPTOMS/MOOD NEUROLOGICAL BOWEL NUTRITION STATUS      Incontinent Diet (see dc summary)  AMBULATORY STATUS COMMUNICATION OF NEEDS Skin     Verbally Normal                       Personal Care Assistance Level of Assistance              Functional Limitations Info  Hearing, Sight Sight Info: Impaired Hearing Info: Impaired      SPECIAL CARE FACTORS FREQUENCY  OT (By licensed OT), PT (By licensed PT)     PT Frequency: 5x week OT Frequency: 5x week            Contractures      Additional Factors Info  Code Status, Allergies, Isolation Precautions Code Status Info: Full Allergies Info: NKA     Isolation Precautions Info: Low risk PPE precaution     Current Medications (04/14/2023):  This is the current hospital active medication list Current Facility-Administered Medications  Medication Dose Route Frequency Provider Last Rate Last Admin   0.9 %  sodium chloride infusion  250 mL Intravenous PRN Graciella Freer, PA-C       acetaminophen (TYLENOL) tablet 1,000 mg  1,000 mg Oral Q6H PRN Graciella Freer, PA-C   1,000 mg at 04/13/23 1543   amiodarone (NEXTERONE PREMIX) 360-4.14 MG/200ML-% (1.8 mg/mL) IV infusion  60 mg/hr Intravenous Continuous Ollis,  Brandi L, NP 33.3 mL/hr at 04/14/23 1000 60 mg/hr at 04/14/23 1000   amoxicillin-clavulanate (AUGMENTIN) 500-125 MG per tablet 1 tablet  1 tablet Oral BID Laurey Morale, MD   1 tablet at 04/14/23 4098   Chlorhexidine Gluconate Cloth 2 % PADS 6 each  6 each Topical Daily Marinus Maw, MD   6 each at 04/14/23 0955   doxycycline (VIBRA-TABS) tablet 100 mg  100 mg Oral Q12H Graciella Freer, PA-C   100 mg at 04/14/23 0955   heparin ADULT infusion 100 units/mL (25000 units/269mL)  1,400 Units/hr  Intravenous Continuous Baglia, Corrina, PA-C 14 mL/hr at 04/14/23 1000 1,400 Units/hr at 04/14/23 1000   HYDROcodone-acetaminophen (NORCO/VICODIN) 5-325 MG per tablet 1 tablet  1 tablet Oral Q6H PRN Graciella Freer, PA-C   1 tablet at 04/13/23 2134   levothyroxine (SYNTHROID) tablet 88 mcg  88 mcg Oral QAC breakfast Graciella Freer, PA-C   88 mcg at 04/14/23 1191   melatonin tablet 5 mg  5 mg Oral QHS Graciella Freer, PA-C   5 mg at 04/13/23 2116   multivitamin with minerals tablet 1 tablet  1 tablet Oral q AM Graciella Freer, PA-C   1 tablet at 04/14/23 4782   mupirocin ointment (BACTROBAN) 2 % 1 Application  1 Application Topical Daily Graciella Freer, PA-C   1 Application at 04/14/23 9562   nitroGLYCERIN (NITROSTAT) SL tablet 0.4 mg  0.4 mg Sublingual Q5 Min x 3 PRN Graciella Freer, PA-C       ondansetron [facility]) injection 4 mg  4 mg Intravenous Q6H PRN Graciella Freer, PA-C   4 mg at 04/12/23 0810   polyethylene glycol (MIRALAX / GLYCOLAX) packet 17 g  17 g Oral Daily PRN Canary Brim L, NP   17 g at 04/07/23 1411   quiniDINE gluconate CR tablet 324 mg  324 mg Oral BID Graciella Freer, PA-C   324 mg at 04/14/23 0955   sodium chloride flush (NS) 0.9 % injection 10-40 mL  10-40 mL Intracatheter Q12H Marinus Maw, MD   30 mL at 04/14/23 0956   sodium chloride flush (NS) 0.9 % injection 10-40 mL  10-40 mL Intracatheter PRN Marinus Maw, MD       sodium chloride flush (NS) 0.9 % injection 3 mL  3 mL Intravenous Q12H Graciella Freer, PA-C   3 mL at 04/14/23 0956   sodium chloride flush (NS) 0.9 % injection 3 mL  3 mL Intravenous PRN Graciella Freer, PA-C       warfarin (COUMADIN) tablet 2.5 mg  2.5 mg Oral ONCE-1600 Marinus Maw, MD       Warfarin - Pharmacist Dosing Inpatient   Does not apply q1600 Marinus Maw, MD   Given at 04/13/23 1543     Discharge Medications: Please see discharge summary for a list  of discharge medications.  Relevant Imaging Results:  Relevant Lab Results:   Additional Information SS# [id number]  Reva Bores, [place]

## 2023-04-14 NOTE — Progress Notes (Addendum)
Rounding Note    Patient Name: Austin [name] Date of Encounter: 04/14/2023  [facility] Cardiologist: Dr. Shirlee Latch EP: Dr. Ladona Ridgel  Subjective   Getting cabin fever, very much wants to go home, says he needs to go tomorrow for a family thing  Inpatient Medications    Scheduled Meds:  amoxicillin-clavulanate  1 tablet Oral BID   Chlorhexidine Gluconate Cloth  6 each Topical Daily   doxycycline  100 mg Oral Q12H   levothyroxine  88 mcg Oral QAC breakfast   melatonin  5 mg Oral QHS   multivitamin with minerals  1 tablet Oral q AM   mupirocin ointment  1 Application Topical Daily   quiniDINE gluconate  324 mg Oral BID   sodium chloride flush  10-40 mL Intracatheter Q12H   sodium chloride flush  3 mL Intravenous Q12H   warfarin  2.5 mg Oral ONCE-1600   Warfarin - Pharmacist Dosing Inpatient   Does not apply q1600   Continuous Infusions:  sodium chloride     amiodarone 60 mg/hr (04/14/23 0900)   furosemide (LASIX) 200 mg in dextrose 5 % 100 mL (2 mg/mL) infusion 12 mg/hr (04/14/23 0900)   heparin 1,400 Units/hr (04/14/23 0900)   PRN Meds: sodium chloride, acetaminophen, HYDROcodone-acetaminophen, nitroGLYCERIN, ondansetron (ZOFRAN) IV, polyethylene glycol, sodium chloride flush, sodium chloride flush   Vital Signs    Vitals:   04/14/23 0729 04/14/23 0800 04/14/23 0811 04/14/23 0900  BP: 113/79 105/69  101/66  Pulse: 90 90  89  Resp: (!) 24 14  (!) 23  Temp:   97.8 F (36.6 C)   TempSrc:   Oral   SpO2: 94% 92%  94%  Weight:      Height:        Intake/Output Summary (Last 24 hours) at 04/14/2023 1024 Last data filed at 04/14/2023 0956 Gross per 24 hour  Intake 1291.93 ml  Output 1010 ml  Net 281.93 ml      04/14/2023    6:30 AM 04/13/2023    6:30 AM 04/12/2023    6:30 AM  Last 3 Weights  Weight (lbs) 201 lb 11.5 oz 199 lb 4.7 oz 201 lb 8 oz  Weight (kg) 91.5 kg 90.4 kg 91.4 kg      Telemetry    VP and AV pacing 90, no VT - Personally  Reviewed  ECG    No new EKGs - Personally Reviewed  Physical Exam   Exam unchanged GEN: No acute distress.   Neck: No JVD Cardiac: RRR, valve appreciated, rubs, or gallops.  Respiratory: soft crackles at the bases b/l. GI: Soft, nontender, non-distended  MS: b/l LE edema remains; No deformity. Neuro:  Nonfocal  Psych: Normal affect   Labs    High Sensitivity Troponin:   Recent Labs  Lab 04/06/23 1026 04/06/23 1430  TROPONINIHS 64* 62*     Chemistry Recent Labs  Lab 04/10/23 0441 04/10/23 1405 04/11/23 0434 04/12/23 0429 04/12/23 0431 04/13/23 0445 04/13/23 1128 04/14/23 0444  NA 132* 130* 134*  --    < > 130* 128* 127*  K 3.2* 4.0 3.7  --    < > 5.7* 5.1 4.6  CL 96* 95* 96*  --    < > 91* 90* 89*  CO2 26 24 28   --    < > 25 24 26   GLUCOSE 96 224* 132*  --    < > 254* 284* 182*  BUN 35* 35* 35*  --    < >  36* 40* 45*  CREATININE 2.29* 2.28* 2.70*  --    < > 2.68* 3.01* 3.02*  CALCIUM 8.2* 8.0* 8.3*  --    < > 8.7* 8.6* 8.2*  MG 2.0  --  1.9 2.2  --   --   --   --   ALBUMIN  --  3.0*  --   --   --   --   --   --   GFRNONAA 28* 28* 23*  --    < > 23* 20* 20*  ANIONGAP 10 11 10   --    < > 14 14 12    < > = values in this interval not displayed.    Lipids No results for input(s): "CHOL", "TRIG", "HDL", "LABVLDL", "LDLCALC", "CHOLHDL" in the last 168 hours.  Hematology Recent Labs  Lab 04/12/23 1818 04/13/23 0445 04/14/23 0444  WBC 6.7 6.7 11.2*  RBC 3.33* 3.32* 3.11*  HGB 10.0* 10.1* 9.4*  HCT 33.5* 33.8* 30.8*  MCV 100.6* 101.8* 99.0  MCH 30.0 30.4 30.2  MCHC 29.9* 29.9* 30.5  RDW 19.8* 19.8* 18.9*  PLT 117* 113* 101*   Thyroid  No results for input(s): "TSH", "FREET4" in the last 168 hours.   BNP No results for input(s): "BNP", "PROBNP" in the last 168 hours.   DDimer No results for input(s): "DDIMER" in the last 168 hours.   Radiology    DG Chest Port 1 View Result Date: 04/09/2023 CLINICAL DATA:  PICC line placement EXAM: PORTABLE CHEST 1  VIEW COMPARISON:  04/06/2023 FINDINGS: Single frontal view of the chest demonstrates interval placement of a left-sided PICC, tip overlying superior vena cava. 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 Stable multi lead AICD and left-sided nerve stimulator. Cardiac silhouette is unchanged. Continued central vascular congestion, with patchy bibasilar consolidation right greater than left increased since prior study. No effusion or pneumothorax. No acute bony abnormalities. IMPRESSION: 1. Left-sided PICC as above. 2. Continued central vascular congestion, with progressive bibasilar consolidation favoring atelectasis. Electronically Signed   By: Sharlet Salina M.D.   On: 04/09/2023 17:49      Cardiac Studies    04/07/23: limited echo  1. Left ventricular ejection fraction, by estimation, is 20%. The left  ventricle has severely decreased function. The left ventricle demonstrates  global hypokinesis. No LV thrombus noted. Indeterminant diastolic  function.   2. Peak RV-RA gradient 19 mmHg. IVC not visualized. Right ventricular  systolic function is mildly reduced. The right ventricular size is normal.   3. Mechanical mitral valve with mean gradient 8 mmHg, increased from  prior. No mitral regurgitation noted though shadowing from prosthetic  valve makes this difficult to discern.   4. Tricuspid valve regurgitation is moderate.   5. The aortic valve is tricuspid. There is moderate calcification of the  aortic valve. Doppler evaluation of aortic valve not done.   6. Limited echo.   Patient Profile     82 y.o. male w/PMHx of  VT (ablation 2017), AFib, CHB, chronic CHF (systolic), VHD s/p MVR (mechanical, 2000), hypothyroidism (s/p thyroidectomy), NICM   Underwent barostim implant 03/17/23 >> complicated by chest wall hematoma >> I&D (x2)  > wound vac  Admitted with VT    Device information MDT CRT d ICD implanted 08/05/2011, gen change 05/04/2017 (has an LV lead in,  abandoned  2/2 high thresholds >with a dual chamber device in) + long hx of VT  VT ablation 09/15/2015 (in d/w Dr. Ladona Ridgel has had 4 in  total)   AAD Amiodarone goes back to 2010 Quinidine started July 2023  Assessment & Plan    VT with ICD shock Perhaps provoked by volume OL Home quinidine 324mg  BID Amiodarone gtt remains at 60 Pace terminated by Dr. Graciela Husbands Sunday >> VP at 100 without recurrent VT so far Pace terminated 04/12/23 morning by Dr. Ladona Ridgel ( )  RV paces a lot (CHB history) 89%  Plan will be to start to reduce pacing rate today and work on transitioning to PO amiodarone after we have reached a target HR (probably 80) Lengthy discussion this week with Dr. Ladona Ridgel Infection risk with adding LB lead far outweighs potential benefit and would not undertake with active wound chest anyway He has had several VT ablations with no plans to re-visit this  No EP procedures planned Base pacing rate reduced to 80 this morning with competitive pacing in atrium, he is in SR 80's underlying this am >> base rate left at 85bpm (DDI) Keep amio at 60 for now If no VT today > plan to transition to PO this evening   Acute on chronic systolic CHF EF 20-25% on Echo 09/28/2022 AHF team is on board/much appreciated Lasix gtt Creat continues to rise 3.02 today (baseline looks probably 1.8-2.0) NA 127 K+ better 4.6 Coox 59 Lasix gtt Remains volume OL Picc now with b/l IV infiltrations RUE swelling/pain looking better Not found to have any vascular compromise Advised elevation     Barostim implantation 8/22 Complicated by hematoma and poorly healing site that has continued to drain. Vasc team on board >> removed yesterday No procedural complications JP remains C/w VVS team   Atrial flutter Longstanding persistent Afib Left sided flutter In AFlutter/AFib currently (noted via programmer/EGMs) Managed on coumadin for CHA2DS2VASc of at least 4.  Heparin > warfarin INR 1.6     Mechanical MVR Functioning well by prior Echo.  Heparin gtt > warfarin as above    D/w Dr. Shirlee Latch, he will take over as attending We will continue to follow   For questions or updates, please contact [facility] Please consult [email or web address] for contact info under       Signed, Sheilah Pigeon, PA-C  04/14/2023, 10:24 AM

## 2023-04-14 NOTE — Progress Notes (Signed)
ANTICOAGULATION CONSULT NOTE - Follow Up  Pharmacy Consult for heparin > warfarin  Indication: atrial fibrillation and mechanical mitral valve   No Known Allergies  Patient Measurements: Height: 6\' 1"  (185.4 cm) Weight: 91.5 kg (201 lb 11.5 oz) IBW/kg (Calculated) : 79.9 Heparin Dosing Weight: 94.3 kg   Vital Signs: Temp: 97.8 F (36.6 C) (09/19 0811) Temp Source: Oral (09/19 0811) BP: 117/85 (09/19 1200) Pulse Rate: 85 (09/19 1200)  Labs: Recent Labs    04/12/23 0431 04/12/23 1818 04/13/23 0445 04/13/23 0831 04/13/23 1128 04/14/23 0444 04/14/23 0847  HGB 9.7* 10.0* 10.1*  --   --  9.4*  --   HCT 31.4* 33.5* 33.8*  --   --  30.8*  --   PLT 118* 117* 113*  --   --  101*  --   LABPROT 17.2*  --  18.6*  --   --  19.4*  --   INR 1.4*  --  1.5*  --   --  1.6*  --   HEPARINUNFRC  --   --   --  0.31  --  1.03* 0.34  CREATININE 2.38*  --  2.68*  --  3.01* 3.02*  --     Estimated Creatinine Clearance: 22 mL/min (A) (by C-G formula based on SCr of 3.02 mg/dL (H)).   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   Conductive hearing loss    uses hearing aids   Dysrhythmia    A-fib   History of blood transfusion 03/24/2023   Hypothyroidism    Intraspinal abscess    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   Nonischemic cardiomyopathy (HCC)    Psychosexual dysfunction with inhibited sexual excitement    S/P mitral valve replacement    Syncope and collapse    Unspecified sleep apnea    last sleep study 11/07, uses cpap   Ventricular tachycardia (HCC)    VT (ventricular tachycardia) (HCC)     Medications:  Scheduled:    amoxicillin-clavulanate  1 tablet Oral BID   Chlorhexidine Gluconate Cloth  6 each Topical Daily   doxycycline  100 mg Oral Q12H   levothyroxine  88 mcg Oral QAC breakfast   melatonin  5 mg Oral QHS   multivitamin with minerals  1 tablet Oral q AM   mupirocin ointment  1 Application Topical Daily   quiniDINE gluconate  324 mg Oral BID   sodium chloride flush  10-40 mL Intracatheter Q12H   sodium chloride flush  3 mL Intravenous Q12H   warfarin  5 mg Oral ONCE-1600   Warfarin - Pharmacist Dosing Inpatient   Does not apply q1600    Assessment: 82 yom presenting after ICD shocks - on enoxaparin and warfarin PTA (LD 9/10 PM). PTA regimen 2.5 mg daily except 5 mg MWF. Currently undergoing bridge given recent admission for Barostim implant complicated with L chest wall hematoma requiring I&D > barostim removed 9/17 and vac removed   INR 1.6 after restart warfarin (has been on hold for procedures)  Heparin level 0.34 at goal (previous level was drawn from same line as heparin)  on heparin drip 1400 uts/hr.  No s/sx of bleeding.  Stable cbc  Procedures complete per VVS and EP > ok to resume warfarin low dose   PTA warfarin 2.5mg  daily except 5mg  TT  Goal of Therapy:  INR 3-3.5  - when warfarin not on hold Heparin level: 0.3-0.5 Monitor platelets by anticoagulation protocol: Yes   Plan:  Continue heparin infusion 1400 units/hr  Warfarin 5mg  x1  Monitor daily Heparin level, protime, CBC, for s/sx of bleeding    Leota Sauers Pharm.D. CPP, BCPS Clinical Pharmacist [phone] 04/14/2023 3:08 PM

## 2023-04-14 NOTE — Consult Note (Signed)
[facility] Inpatient Consult   04/14/2023  Parth Kolar 11-04-1940 [id number]  [facility] [[facility]]  Accountable Care Organization [ACO] Patient: [facility]  Primary Care Provider: [name], MD with [place] at [facility], this provider office provides the [facility] calls and for appointment follow up.  The patient was screened for 7 day readmission hospitalization with noted high risk score for unplanned readmission risk with 3 admissions in 6 months. *Patient remains at ICU level of care at this time.  The patient was assessed for potential [facility]) Care Management service needs for post hospital transition for care coordination. Review of patient's electronic medical record reveals patient is admitted with VTach.  Patient is from home with spouse in ILF. Patient is currently followed by the AHF team noted.  Plan: [facility] Liaison will continue to follow progress and disposition to asess for post hospital community care coordination/management needs.  Referral request for community care coordination: pending disposition. Continue to follow for needs.   [facility] Care Management/Population Health does not replace or interfere with any arrangements made by the Inpatient Transition of Care team.   For questions contact:   Charlesetta Shanks, RN, BSN, CCM [place]  [facility], [facility] Liaison Direct Dial: [phone] or secure chat Website: [email or web address]

## 2023-04-14 NOTE — Evaluation (Signed)
Occupational Therapy Evaluation Patient Details Name: Austin [name] MRN: [id number] DOB: 30-Apr-1941 Today's Date: 04/14/2023   History of Present Illness Pt is an 82 y.o. male who presented 04/06/23 with ICD shocks. Of note, pt underwent Barostim device 03/17/23. S/p removal of L sided Barostim device and redo L carotid artery exposure 9/17. PMH: Mechanical MV s/p MV endocarditis, VT, NICM, CHB and PAF/AFL, CKD, CHF, syncope, sleep apnea   Clinical Impression   Patient admitted for the diagnosis above.  PTA he lives at a local ILF with his spouse, and remained Mod I at 4wrw level with mobility and ADL.  Currently he is needing up to Min A for lower body ADL and CGA for in room mobility.  OT will follow in the acute setting to address deficits and Patient will benefit from continued inpatient follow up therapy, <3 hours/day.  Patient may progress to needing 24 hour supervision and HH.          If plan is discharge home, recommend the following: A little help with bathing/dressing/bathroom;A little help with walking and/or transfers;Assist for transportation;Assistance with cooking/housework    Functional Status Assessment  Patient has had a recent decline in their functional status and demonstrates the ability to make significant improvements in function in a reasonable and predictable amount of time.  Equipment Recommendations  None recommended by OT    Recommendations for Other Services       Precautions / Restrictions Precautions Precautions: Fall;ICD/Pacemaker Precaution Comments: JP drain L chest; watch BP Restrictions Weight Bearing Restrictions: No      Mobility Bed Mobility Overal bed mobility: Needs Assistance Bed Mobility: Sit to Supine       Sit to supine: Min assist, HOB elevated        Transfers Overall transfer level: Needs assistance Equipment used: Rolling walker (2 wheels) Transfers: Sit to/from Stand, Bed to chair/wheelchair/BSC Sit to Stand:  Contact guard assist     Step pivot transfers: Contact guard assist            Balance Overall balance assessment: Needs assistance Sitting-balance support: No upper extremity supported, Feet supported Sitting balance-Leahy Scale: Good     Standing balance support: Reliant on assistive device for balance Standing balance-Leahy Scale: Poor                             ADL either performed or assessed with clinical judgement   ADL Overall ADL's : Needs assistance/impaired Eating/Feeding: Independent;Sitting   Grooming: Contact guard assist;Standing   Upper Body Bathing: Contact guard assist;Sitting   Lower Body Bathing: Minimal assistance;Sit to/from stand   Upper Body Dressing : Contact guard assist;Sitting   Lower Body Dressing: Minimal assistance;Sit to/from stand   Toilet Transfer: Contact guard assist;Rolling walker (2 wheels);Ambulation;Regular Toilet                   Vision Patient Visual Report: No change from baseline       Perception Perception: Within Functional Limits       Praxis Praxis: [facility]       Pertinent Vitals/Pain Pain Assessment Pain Assessment: Faces Faces Pain Scale: No hurt Pain Intervention(s): Monitored during session     Extremity/Trunk Assessment Upper Extremity Assessment Upper Extremity Assessment: Overall WFL for tasks assessed   Lower Extremity Assessment Lower Extremity Assessment: Defer to PT evaluation   Cervical / Trunk Assessment Cervical / Trunk Assessment: Normal   Communication Communication Communication: No apparent difficulties  Cognition Arousal: Alert Behavior During Therapy: WFL for tasks assessed/performed Overall Cognitive Status: Within Functional Limits for tasks assessed                                                        Home Living Family/patient expects to be discharged to:: Assisted living Living Arrangements: Spouse/significant other                            Home Equipment: Rollator (4 wheels)   Additional Comments: Family reports plan is for SNF prior to return to ILF      Prior Functioning/Environment Prior Level of Function : Independent/Modified Independent             Mobility Comments: Used rollator for community mobility but no device inside his home ADLs Comments: No assist for ADL and iADL        OT Problem List: Decreased strength;Decreased activity tolerance;Impaired balance (sitting and/or standing)      OT Treatment/Interventions: Self-care/ADL training;Therapeutic activities;Balance training;Patient/family education    OT Goals(Current goals can be found in the care plan section) Acute Rehab OT Goals Patient Stated Goal: Return home OT Goal Formulation: With patient Time For Goal Achievement: 05/05/23 Potential to Achieve Goals: Good ADL Goals Pt Will Perform Grooming: with set-up;standing Pt Will Perform Lower Body Bathing: with set-up;sit to/from stand Pt Will Perform Lower Body Dressing: with set-up;sit to/from stand Pt Will Transfer to Toilet: with modified independence;ambulating;regular height toilet  OT Frequency: Min 1X/week    Co-evaluation              AM-PAC OT "6 Clicks" Daily Activity     Outcome Measure Help from another person eating meals?: None Help from another person taking care of personal grooming?: A Little Help from another person toileting, which includes using toliet, bedpan, or urinal?: A Little Help from another person bathing (including washing, rinsing, drying)?: A Little Help from another person to put on and taking off regular upper body clothing?: A Little Help from another person to put on and taking off regular lower body clothing?: A Little 6 Click Score: 19   End of Session Equipment Utilized During Treatment: Rolling walker (2 wheels) Nurse Communication: Mobility status  Activity Tolerance: Patient tolerated treatment well Patient  left: in bed;with call bell/phone within reach  OT Visit Diagnosis: Unsteadiness on feet (R26.81)                Time: 1610-9604 OT Time Calculation (min): 23 min Charges:  OT General Charges $OT Visit: 1 Visit OT Evaluation $OT Eval Moderate Complexity: 1 Mod OT Treatments $Self Care/Home Management : 8-22 mins  04/14/2023  RP, OTR/L  Acute Rehabilitation Services  Office:  [phone]   Suzanna Obey 04/14/2023, 11:33 AM

## 2023-04-14 NOTE — Progress Notes (Signed)
Telemetry reviewed AV paced, no VT Will transition to 400mg  BID amiodarone  Francis Dowse, PA-C

## 2023-04-14 NOTE — Progress Notes (Signed)
Patient ID: Austin [name], male   DOB: 17-Sep-1940, 82 y.o.   MRN: [id number]     Advanced Heart Failure Rounding Note  PCP-Cardiologist: None   Subjective:    Patient was paced out of slow VT on 9/13.  Since then, has been in and out of slow VT.  Was out of VT on 9/14 but went back in slow VT 9/15 Back in slow VT for a couple of hours rate around 100.  When out of VT, he has had atrial tachycardia with intermittent RV pacing. EP saw and programmed ( RV lead only) to 100 . Switched to lasix drip and given metolazone. 9/16 Back in VT  9/17 VT Paced out. S/P Barostimulator removal.   This morning, he is a-paced, v-paced.  Now appears to be out of atrial tachycardia.   He is still on amiodarone gtt 60 mg/hr.   Remains on lasix drip 12 mg per hour. Creatinine increased to 3.02.  Co-ox 60% with CVP 10-11.  I/Os even.   INR 1.6 today on warfarin/heparin overlap.   Wants to go home.   Objective:   Weight Range: 91.5 kg Body mass index is 26.61 kg/m.   Vital Signs:   Temp:  [97.5 F (36.4 C)-97.8 F (36.6 C)] 97.8 F (36.6 C) (09/19 0811) Pulse Rate:  [89-91] 90 (09/19 1012) Resp:  [12-28] 23 (09/19 0900) BP: (99-143)/(66-96) 104/70 (09/19 1012) SpO2:  [90 %-100 %] 95 % (09/19 1012) Weight:  [91.5 kg] 91.5 kg (09/19 0630) Last BM Date : 04/12/23  Weight change: Filed Weights   04/12/23 0630 04/13/23 0630 04/14/23 0630  Weight: 91.4 kg 90.4 kg 91.5 kg    Intake/Output:   Intake/Output Summary (Last 24 hours) at 04/14/2023 1048 Last data filed at 04/14/2023 1000 Gross per 24 hour  Intake 1315.27 ml  Output 1010 ml  Net 305.27 ml     CVP 10-11 Physical Exam   General: NAD Neck: JVP 8-9 cm, no thyromegaly or thyroid nodule.  Lungs: Clear to auscultation bilaterally with normal respiratory effort. CV: Nondisplaced PMI.  Heart regular S1/S2 with mechanical S1, no S3/S4, no murmur.  1+ ankle edema.  Abdomen: Soft, nontender, no hepatosplenomegaly, no distention.   Skin: Intact without lesions or rashes.  Neurologic: Alert and oriented x 3.  Psych: Normal affect. Extremities: No clubbing or cyanosis.  HEENT: Normal.    Telemetry   A-paced, v-paced rate 85 (personally reviewed)  EKG    No new EKG to review   Labs    CBC Recent Labs    04/13/23 0445 04/14/23 0444  WBC 6.7 11.2*  HGB 10.1* 9.4*  HCT 33.8* 30.8*  MCV 101.8* 99.0  PLT 113* 101*   Basic Metabolic Panel Recent Labs    78/29/56 0429 04/12/23 0431 04/13/23 1128 04/14/23 0444  NA  --    < > 128* 127*  K  --    < > 5.1 4.6  CL  --    < > 90* 89*  CO2  --    < > 24 26  GLUCOSE  --    < > 284* 182*  BUN  --    < > 40* 45*  CREATININE  --    < > 3.01* 3.02*  CALCIUM  --    < > 8.6* 8.2*  MG 2.2  --   --   --    < > = values in this interval not displayed.   Liver Function Tests No results for  input(s): "AST", "ALT", "ALKPHOS", "BILITOT", "PROT", "ALBUMIN" in the last 72 hours.   No results for input(s): "LIPASE", "AMYLASE" in the last 72 hours. Cardiac Enzymes No results for input(s): "CKTOTAL", "CKMB", "CKMBINDEX", "TROPONINI" in the last 72 hours.  BNP: BNP (last 3 results) Recent Labs    02/03/23 1502 03/23/23 1045 04/06/23 1026  BNP 453.3* 352.5* 1,258.7*    ProBNP (last 3 results) Recent Labs    12/16/22 0959  PROBNP 3,692*     D-Dimer No results for input(s): "DDIMER" in the last 72 hours. Hemoglobin A1C No results for input(s): "HGBA1C" in the last 72 hours. Fasting Lipid Panel No results for input(s): "CHOL", "HDL", "LDLCALC", "TRIG", "CHOLHDL", "LDLDIRECT" in the last 72 hours. Thyroid Function Tests No results for input(s): "TSH", "T4TOTAL", "T3FREE", "THYROIDAB" in the last 72 hours.  Invalid input(s): "FREET3"   Other results:   Imaging    No results found.   Medications:     Scheduled Medications:  amoxicillin-clavulanate  1 tablet Oral BID   Chlorhexidine Gluconate Cloth  6 each Topical Daily   doxycycline  100  mg Oral Q12H   levothyroxine  88 mcg Oral QAC breakfast   melatonin  5 mg Oral QHS   multivitamin with minerals  1 tablet Oral q AM   mupirocin ointment  1 Application Topical Daily   quiniDINE gluconate  324 mg Oral BID   sodium chloride flush  10-40 mL Intracatheter Q12H   sodium chloride flush  3 mL Intravenous Q12H   warfarin  2.5 mg Oral ONCE-1600   Warfarin - Pharmacist Dosing Inpatient   Does not apply q1600    Infusions:  sodium chloride     amiodarone 60 mg/hr (04/14/23 1000)   furosemide (LASIX) 200 mg in dextrose 5 % 100 mL (2 mg/mL) infusion 12 mg/hr (04/14/23 1000)   heparin 1,400 Units/hr (04/14/23 1000)    PRN Medications: sodium chloride, acetaminophen, HYDROcodone-acetaminophen, nitroGLYCERIN, ondansetron (ZOFRAN) IV, polyethylene glycol, sodium chloride flush, sodium chloride flush    Patient Profile   82 y/o male w/ 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    Now readmitted for ICD shocks for VT and a/c CHF.  Assessment/Plan   1. VT: History of VT and VT ablation in 2017.  MDT ICD.  Redo VT ablation in 1/23 (Dr. Marquette Saa).  He is on amiodarone, this was increased to 200 mg daily as outpatient with recurrent VT.  He has tolerated amiodarone poorly historically but is doing ok with it today.  In 7/23, slow VT was pace-terminated in the office, then he had an ICD shock for VT later in 7/23.  He was started on quinidine after this.  He is followed by Dr. Ladona Ridgel.  Now with 2 further ICD shocks for VT prior to this admission, had been in slow VT rate 100s-110s this admission.  Paced out of VT 9/13 by EP, since then has been in and out of slow VT.  At baseline, he RV paces most of the time. EP decreased pacing rate to  85 today.  He has remained out of VT over the last day.  - Transition to po amiodarone this evening.  - Continue quinidine.  2. Acute on chronic systolic CHF: Long-standing, suspected nonischemic cardiomyopathy.  Had RHC on 02/26/21  with normal filling pressures and mildly low cardiac output.  Echo in 4/23 showed  EF 20-25% with diffuse hypokinesis and septal-lateral dyssynchrony, mildly decreased RV systolic function, mechanical MV with normal function, dilated IVC.  He has a [employer] CRT-D device present, but the LV lead is not functional.  He has complete heart block, so has been RV paced chronically with a wide QRS.  Has had difficulty tolerating GDMT historically with CKD and hypotension.  Currently off his home GDMT due to low BP and elevated creatinine.  Echo this admission with EF 20%, mechanical MV mean gradient 8 mmHg.  Co-ox 60% today, CVP 10-11 on my read this morning. He has been on Lasix gtt 12 mg/hr but renal function continues to worsen, creatinine up to 3 today.  - Stop Lasix gtt, will follow creatinine and start po diuretics when it has stabilized.  - Will not use inotrope with frequent VT.  - Spironolactone, valsartan, and Toprol XL on hold for now with low SBP and AKI.  - Off digoxin due to persistently elevated level.  - He did not tolerate SGLT2 inhibitor in the past.  - The patient has CHB and has been chronically RV pacing as his LV lead is nonfunctional.  This is not ideal and creates dyssynchrony with a very wide QRS.  Left bundle lead placement discussed in past with Dr. Ladona Ridgel but thought risk would be too high and left bundle lead was not attempted.   - Barostimulator device placed but complicated by chest wall hematoma as below and has been removed.  3. Atrial fibrillation/flutter, atrial tachycardia: Paroxysmal.  This admission, when out of VT, he has been in atrial tachycardia. However, this morning, he is out of AT and is v-pacing.  - Continue amiodarone - On Heparin drip  bridging to therapeutic INR.  4. Complete heart block: Patient is pacer dependent due to CHB.  - See above, upgrade to functional CRT thought to be too risky due to need to extract old CS lead. 5. OSA: Severe. Continue CPAP 6. Mechanical mitral valve: Mean gradient higher on echo this admission at 8 mmHg. INR 1.6 today.  - Heparin gtt bridging for warfarin INR goal 2.5-3.5.  7. Chest Wall Hematoma: post barostimulator complication, in setting of coumadin for mechanical MV.  s/p I&D for evacuation of hematoma and placement of wound vac + placement of antibiotic beads on 8/30. Barostimulator device removed 9/17, still has drain in place.  - On Augmentin.  - wound vac management per [name]  - vascular following.   8. AKI on CKD stage 3: Creatinine baseline 1.5-2.  2.7=>2.38=>2.68 => 3  today.  - Stop Lasix gtt.   Mobilize.   Length of Stay: 8  Marca Ancona, MD  04/14/2023, 10:48 AM  Advanced Heart Failure Team Pager [phone] (M-F; 7a - 5p)  Please contact [facility] for night-coverage after hours (5p -7a ) and weekends on [email or web address]

## 2023-04-14 NOTE — Progress Notes (Signed)
Physical Therapy Treatment Patient Details Name: Austin [name] MRN: [id number] DOB: January 19, 1941 Today's Date: 04/14/2023   History of Present Illness Pt is an 82 y.o. male who presented 04/06/23 with ICD shocks. Of note, pt underwent Barostim device 03/17/23. S/p removal of L sided Barostim device and redo L carotid artery exposure 9/17. PMH: Mechanical MV s/p MV endocarditis, VT, NICM, CHB and PAF/AFL, CKD, CHF, syncope, sleep apnea    PT Comments  Pt starting to progress well toward goals.  Emphasis on transitions, scooting, safe sit to stands and progression of gait stability/stamina with a rollator.     If plan is discharge home, recommend the following: A little help with walking and/or transfers;A little help with bathing/dressing/bathroom;Assistance with cooking/housework;Assist for transportation   Can travel by private vehicle     Yes  Equipment Recommendations       Recommendations for Other Services       Precautions / Restrictions Precautions Precautions: Fall;ICD/Pacemaker Precaution Comments: JP drain L chest; watch BP     Mobility  Bed Mobility Overal bed mobility: Needs Assistance Bed Mobility: Supine to Sit     Supine to sit: Min assist     General bed mobility comments: needed light minimal assist, most assist coming from R UE.    Transfers Overall transfer level: Needs assistance Equipment used: Rollator (4 wheels) Transfers: Sit to/from Stand Sit to Stand: Contact guard assist           General transfer comment: cues for hand placement otherwise no assist    Ambulation/Gait Ambulation/Gait assistance: Contact guard assist, Min assist Gait Distance (Feet): 300 Feet (x1 with CGA during gait and min assist in standing.) Assistive device: Rollator (4 wheels) Gait Pattern/deviations: Step-through pattern, Decreased step length - right, Decreased step length - left, Decreased stride length, Trunk flexed, Drifts right/left Gait velocity:  reduced Gait velocity interpretation: <1.8 ft/sec, indicate of risk for recurrent falls   General Gait Details: mild unsteadiness manage well with rollator, but pt needed min stability assist when standing still or will focus   Stairs             Wheelchair Mobility     Tilt Bed    Modified Rankin (Stroke Patients Only)       Balance Overall balance assessment: Needs assistance Sitting-balance support: No upper extremity supported, Feet supported Sitting balance-Leahy Scale: Good Sitting balance - Comments: EOB     Standing balance-Leahy Scale: Poor Standing balance comment: Reliant on RW and external support                            Cognition Arousal: Alert Behavior During Therapy: WFL for tasks assessed/performed Overall Cognitive Status: Within Functional Limits for tasks assessed                                          Exercises      General Comments General comments (skin integrity, edema, etc.): BP up OOB in chair 111/70      Pertinent Vitals/Pain Pain Assessment Pain Assessment: Faces Faces Pain Scale: Hurts a little bit Pain Descriptors / Indicators: Grimacing Pain Intervention(s): Other (comment), Monitored during session    Home Living                          Prior Function  PT Goals (current goals can now be found in the care plan section) Acute Rehab PT Goals Patient Stated Goal: to improve PT Goal Formulation: With patient/family Time For Goal Achievement: 04/27/23 Potential to Achieve Goals: Good Progress towards PT goals: Progressing toward goals    Frequency    Min 1X/week      PT Plan      Co-evaluation              AM-PAC PT "6 Clicks" Mobility   Outcome Measure  Help needed turning from your back to your side while in a flat bed without using bedrails?: A Little Help needed moving from lying on your back to sitting on the side of a flat bed without using  bedrails?: A Little Help needed moving to and from a bed to a chair (including a wheelchair)?: A Little Help needed standing up from a chair using your arms (e.g., wheelchair or bedside chair)?: A Little Help needed to walk in hospital room?: A Little Help needed climbing 3-5 steps with a railing? : A Lot 6 Click Score: 17    End of Session   Activity Tolerance: Patient tolerated treatment well Patient left: in chair;with call bell/phone within reach;with family/visitor present Nurse Communication: Mobility status PT Visit Diagnosis: Other abnormalities of gait and mobility (R26.89);Unsteadiness on feet (R26.81);Muscle weakness (generalized) (M62.81);Difficulty in walking, not elsewhere classified (R26.2);Pain     Time: 1540-1606 PT Time Calculation (min) (ACUTE ONLY): 26 min  Charges:    $Gait Training: 8-22 mins $Therapeutic Activity: 8-22 mins PT General Charges $$ ACUTE PT VISIT: 1 Visit                     04/14/2023  Jacinto Halim., PT Acute Rehabilitation Services [phone]  (office)   Austin [name] 04/14/2023, 4:29 PM

## 2023-04-15 DIAGNOSIS — I5023 Acute on chronic systolic (congestive) heart failure: Secondary | ICD-10-CM | POA: Diagnosis not present

## 2023-04-15 DIAGNOSIS — T827XXA Infection and inflammatory reaction due to other cardiac and vascular devices, implants and grafts, initial encounter: Secondary | ICD-10-CM

## 2023-04-15 DIAGNOSIS — I5043 Acute on chronic combined systolic (congestive) and diastolic (congestive) heart failure: Secondary | ICD-10-CM | POA: Diagnosis not present

## 2023-04-15 DIAGNOSIS — I472 Ventricular tachycardia, unspecified: Secondary | ICD-10-CM | POA: Diagnosis not present

## 2023-04-15 LAB — BASIC METABOLIC PANEL
Anion gap: 16 — ABNORMAL HIGH (ref 5–15)
BUN: 53 mg/dL — ABNORMAL HIGH (ref 8–23)
CO2: 27 mmol/L (ref 22–32)
Calcium: 8.3 mg/dL — ABNORMAL LOW (ref 8.9–10.3)
Chloride: 86 mmol/L — ABNORMAL LOW (ref 98–111)
Creatinine, Ser: 3.5 mg/dL — ABNORMAL HIGH (ref 0.61–1.24)
GFR, Estimated: 17 mL/min — ABNORMAL LOW (ref >60)
Glucose, Bld: 91 mg/dL (ref 70–99)
Potassium: 4.2 mmol/L (ref 3.5–5.1)
Sodium: 129 mmol/L — ABNORMAL LOW (ref 135–145)

## 2023-04-15 LAB — CBC
HCT: 30.3 % — ABNORMAL LOW (ref 39.0–52.0)
HCT: 31.9 % — ABNORMAL LOW (ref 39.0–52.0)
Hemoglobin: 9.3 g/dL — ABNORMAL LOW (ref 13.0–17.0)
Hemoglobin: 9.9 g/dL — ABNORMAL LOW (ref 13.0–17.0)
MCH: 29.8 pg (ref 26.0–34.0)
MCH: 30.9 pg (ref 26.0–34.0)
MCHC: 30.7 g/dL (ref 30.0–36.0)
MCHC: 31 g/dL (ref 30.0–36.0)
MCV: 97.1 fL (ref 80.0–100.0)
MCV: 99.7 fL (ref 80.0–100.0)
Platelets: 89 10*3/uL — ABNORMAL LOW (ref 150–400)
Platelets: 95 10*3/uL — ABNORMAL LOW (ref 150–400)
RBC: 3.12 MIL/uL — ABNORMAL LOW (ref 4.22–5.81)
RBC: 3.2 MIL/uL — ABNORMAL LOW (ref 4.22–5.81)
RDW: 18.9 % — ABNORMAL HIGH (ref 11.5–15.5)
RDW: 18.8 % — ABNORMAL HIGH (ref 11.5–15.5)
WBC: 9.9 10*3/uL (ref 4.0–10.5)
WBC: 9.8 10*3/uL (ref 4.0–10.5)
nRBC: 0.4 % — ABNORMAL HIGH (ref 0.0–0.2)
nRBC: 0.3 % — ABNORMAL HIGH (ref 0.0–0.2)

## 2023-04-15 LAB — COOXEMETRY PANEL
Carboxyhemoglobin: 2.4 % — ABNORMAL HIGH (ref 0.5–1.5)
Methemoglobin: 0.8 % (ref 0.0–1.5)
O2 Saturation: 69.6 %
Total hemoglobin: 9.9 g/dL — ABNORMAL LOW (ref 12.0–16.0)

## 2023-04-15 LAB — HEPARIN LEVEL (UNFRACTIONATED): Heparin Unfractionated: 0.29 IU/mL — ABNORMAL LOW (ref 0.30–0.70)

## 2023-04-15 LAB — PROTIME-INR
INR: 1.6 — ABNORMAL HIGH (ref 0.8–1.2)
Prothrombin Time: 19.2 seconds — ABNORMAL HIGH (ref 11.4–15.2)

## 2023-04-15 MED ORDER — FUROSEMIDE 10 MG/ML IJ SOLN
80.0000 mg | Freq: Once | INTRAMUSCULAR | Status: AC
  Administered 2023-04-15: 80 mg via INTRAVENOUS
  Filled 2023-04-15: qty 8

## 2023-04-15 MED ORDER — CIPROFLOXACIN HCL 500 MG PO TABS
500.0000 mg | ORAL_TABLET | Freq: Every day | ORAL | Status: DC
  Administered 2023-04-16 – 2023-04-22 (×7): 500 mg via ORAL
  Filled 2023-04-15 (×9): qty 1

## 2023-04-15 MED ORDER — SODIUM CHLORIDE 0.9 % IV SOLN
2.0000 g | INTRAVENOUS | Status: DC
  Administered 2023-04-15: 2 g via INTRAVENOUS
  Filled 2023-04-15: qty 12.5

## 2023-04-15 MED ORDER — WARFARIN SODIUM 5 MG PO TABS: 5.0000 mg | ORAL_TABLET | Freq: Once | ORAL | Status: DC

## 2023-04-15 MED ORDER — WARFARIN SODIUM 5 MG PO TABS
7.5000 mg | ORAL_TABLET | Freq: Once | ORAL | Status: AC
  Administered 2023-04-15: 7.5 mg via ORAL
  Filled 2023-04-15: qty 1

## 2023-04-15 NOTE — Progress Notes (Signed)
Rounding Note    Patient Name: Austin [name] Date of Encounter: 04/15/2023  [facility] Cardiologist: Dr. Shirlee Latch EP: Dr. Ladona Ridgel  Subjective   Getting cabin fever, very much wants to go home, says he needs to go tomorrow for a family thing  Inpatient Medications    Scheduled Meds:  amiodarone  400 mg Oral BID   amoxicillin-clavulanate  1 tablet Oral BID   Chlorhexidine Gluconate Cloth  6 each Topical Daily   doxycycline  100 mg Oral Q12H   levothyroxine  88 mcg Oral QAC breakfast   melatonin  5 mg Oral QHS   multivitamin with minerals  1 tablet Oral q AM   mupirocin ointment  1 Application Topical Daily   quiniDINE gluconate  324 mg Oral BID   sodium chloride flush  10-40 mL Intracatheter Q12H   sodium chloride flush  3 mL Intravenous Q12H   Warfarin - Pharmacist Dosing Inpatient   Does not apply q1600   Continuous Infusions:  sodium chloride     heparin 1,400 Units/hr (04/15/23 0400)   PRN Meds: sodium chloride, acetaminophen, HYDROcodone-acetaminophen, nitroGLYCERIN, ondansetron (ZOFRAN) IV, polyethylene glycol, sodium chloride flush, sodium chloride flush   Vital Signs    Vitals:   04/15/23 0300 04/15/23 0400 04/15/23 0500 04/15/23 0600  BP: 114/82 114/81 115/80 114/69  Pulse: 85 85 85 84  Resp: 18 20 11 14   Temp:      TempSrc:      SpO2: 100% 99% 99% 100%  Weight:    91 kg  Height:        Intake/Output Summary (Last 24 hours) at 04/15/2023 0840 Last data filed at 04/15/2023 0400 Gross per 24 hour  Intake 875.24 ml  Output 1385 ml  Net -509.76 ml      04/15/2023    6:00 AM 04/14/2023    6:30 AM 04/13/2023    6:30 AM  Last 3 Weights  Weight (lbs) 200 lb 9.9 oz 201 lb 11.5 oz 199 lb 4.7 oz  Weight (kg) 91 kg 91.5 kg 90.4 kg      Telemetry    VP and AV pacing 90, no VT - Personally Reviewed  ECG    No new EKGs - Personally Reviewed  Physical Exam   Exam unchanged GEN: No acute distress.   Neck: No JVD Cardiac: RRR, valve  appreciated, rubs, or gallops.  Respiratory: soft crackles at the bases b/l. GI: Soft, nontender, non-distended  MS: b/l LE edema remains; No deformity. Neuro:  Nonfocal  Psych: Normal affect   Labs    High Sensitivity Troponin:   Recent Labs  Lab 04/06/23 1026 04/06/23 1430  TROPONINIHS 64* 62*     Chemistry Recent Labs  Lab 04/10/23 0441 04/10/23 1405 04/11/23 0434 04/12/23 0429 04/12/23 0431 04/13/23 1128 04/14/23 0444 04/15/23 0335  NA 132* 130* 134*  --    < > 128* 127* 129*  K 3.2* 4.0 3.7  --    < > 5.1 4.6 4.2  CL 96* 95* 96*  --    < > 90* 89* 86*  CO2 26 24 28   --    < > 24 26 27   GLUCOSE 96 224* 132*  --    < > 284* 182* 91  BUN 35* 35* 35*  --    < > 40* 45* 53*  CREATININE 2.29* 2.28* 2.70*  --    < > 3.01* 3.02* 3.50*  CALCIUM 8.2* 8.0* 8.3*  --    < >  8.6* 8.2* 8.3*  MG 2.0  --  1.9 2.2  --   --   --   --   ALBUMIN  --  3.0*  --   --   --   --   --   --   GFRNONAA 28* 28* 23*  --    < > 20* 20* 17*  ANIONGAP 10 11 10   --    < > 14 12 16*   < > = values in this interval not displayed.    Lipids No results for input(s): "CHOL", "TRIG", "HDL", "LABVLDL", "LDLCALC", "CHOLHDL" in the last 168 hours.  Hematology Recent Labs  Lab 04/13/23 0445 04/14/23 0444 04/15/23 0335  WBC 6.7 11.2* 9.9  RBC 3.32* 3.11* 3.12*  HGB 10.1* 9.4* 9.3*  HCT 33.8* 30.8* 30.3*  MCV 101.8* 99.0 97.1  MCH 30.4 30.2 29.8  MCHC 29.9* 30.5 30.7  RDW 19.8* 18.9* 18.9*  PLT 113* 101* 89*   Thyroid  No results for input(s): "TSH", "FREET4" in the last 168 hours.   BNP No results for input(s): "BNP", "PROBNP" in the last 168 hours.   DDimer No results for input(s): "DDIMER" in the last 168 hours.   Radiology    DG Chest Port 1 View Result Date: 04/09/2023 CLINICAL DATA:  PICC line placement EXAM: PORTABLE CHEST 1 VIEW COMPARISON:  04/06/2023 FINDINGS: Single frontal view of the chest demonstrates interval placement of a left-sided PICC, tip overlying superior vena cava.  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 Stable multi lead AICD and left-sided nerve stimulator. Cardiac silhouette is unchanged. Continued central vascular congestion, with patchy bibasilar consolidation right greater than left increased since prior study. No effusion or pneumothorax. No acute bony abnormalities. IMPRESSION: 1. Left-sided PICC as above. 2. Continued central vascular congestion, with progressive bibasilar consolidation favoring atelectasis. Electronically Signed   By: Sharlet Salina M.D.   On: 04/09/2023 17:49      Cardiac Studies    04/07/23: limited echo  1. Left ventricular ejection fraction, by estimation, is 20%. The left  ventricle has severely decreased function. The left ventricle demonstrates  global hypokinesis. No LV thrombus noted. Indeterminant diastolic  function.   2. Peak RV-RA gradient 19 mmHg. IVC not visualized. Right ventricular  systolic function is mildly reduced. The right ventricular size is normal.   3. Mechanical mitral valve with mean gradient 8 mmHg, increased from  prior. No mitral regurgitation noted though shadowing from prosthetic  valve makes this difficult to discern.   4. Tricuspid valve regurgitation is moderate.   5. The aortic valve is tricuspid. There is moderate calcification of the  aortic valve. Doppler evaluation of aortic valve not done.   6. Limited echo.   Patient Profile     82 y.o. male w/PMHx of  VT (ablation 2017), AFib, CHB, chronic CHF (systolic), VHD s/p MVR (mechanical, 2000), hypothyroidism (s/p thyroidectomy), NICM   Underwent barostim implant 03/17/23 >> complicated by chest wall hematoma >> I&D (x2)  > wound vac  Admitted with VT    Device information MDT CRT d ICD implanted 08/05/2011, gen change 05/04/2017 (has an LV lead in, abandoned  2/2 high thresholds >with a dual chamber device in) + long hx of VT  VT ablation 09/15/2015 (in d/w Dr. Ladona Ridgel has had 4 in total)    AAD Amiodarone goes back to 2010 Quinidine started July 2023  Assessment & Plan    VT with ICD shock Perhaps provoked by volume OL Home  quinidine 324mg  BID Amiodarone gtt remains at 60 Pace terminated by Dr. Graciela Husbands Sunday >> VP at 100 without recurrent VT so far Pace terminated 04/12/23 morning by Dr. Ladona Ridgel ( )  RV paces a lot (CHB history) 89%  No VT in 48 hours, transitioned to PO amiodarone last night  No EP procedures planned Base pacing rate reduced to 80 yesterday morning with competitive pacing in [facility]rium, he is in SR 80's underlying this am >> base rate left at 85bpm (DDI) >> no further adjustments  Amiodarone 400mg  BID x1 week Then 400mg  daily until seen in clinic  EP follow up is in place    Acute on chronic systolic CHF EF 20-25% on Echo 09/28/2022 AHF team Creat continues to rise 3.50 today (baseline looks probably 1.8-2.0)  Picc with b/l IV infiltrations RUE swelling/pain continues to look better Not found to have any vascular compromise Advised elevation     Barostim implantation 8/22 Complicated by hematoma and poorly healing site that has continued to drain. Vasc team on board >> removed yesterday No procedural complications JP remains C/w VVS team   Atrial flutter Longstanding persistent Afib Left sided flutter In AFlutter/AFib currently (noted via programmer/EGMs) Managed on coumadin for CHA2DS2VASc of at least 4.  Heparin > warfarin INR pending Plts trending down  Mechanical MVR Functioning well by prior Echo.  Heparin gtt > warfarin as above INR pending today Platelets are down    Patient very much wants to go home/rehab Dr. Jimmey Ralph has seen the patient Purely from an EP perspective OK to go with amiodarone as above Ultimate discharge as per [facility] team   For questions or updates, please contact [facility] Please consult [email or web address] for contact info under       Signed, Sheilah Pigeon, PA-C  04/15/2023, 8:40 AM

## 2023-04-15 NOTE — Progress Notes (Signed)
ANTICOAGULATION CONSULT NOTE - Follow Up  Pharmacy Consult for heparin > warfarin  Indication: atrial fibrillation and mechanical mitral valve   No Known Allergies  Patient Measurements: Height: 6\' 1"  (185.4 cm) Weight: 91 kg (200 lb 9.9 oz) IBW/kg (Calculated) : 79.9 Heparin Dosing Weight: 94.3 kg   Vital Signs: Temp: 97.6 F (36.4 C) (09/20 0800) Temp Source: Axillary (09/20 0800) BP: 113/75 (09/20 0800) Pulse Rate: 85 (09/20 0800)  Labs: Recent Labs    04/13/23 0445 04/13/23 0831 04/13/23 1128 04/14/23 0444 04/14/23 0847 04/15/23 0330 04/15/23 0335  HGB 10.1*  --   --  9.4*  --   --  9.3*  HCT 33.8*  --   --  30.8*  --   --  30.3*  PLT 113*  --   --  101*  --   --  89*  LABPROT 18.6*  --   --  19.4*  --  19.2*  --   INR 1.5*  --   --  1.6*  --  1.6*  --   HEPARINUNFRC  --    < >  --  1.03* 0.34 0.29*  --   CREATININE 2.68*  --  3.01* 3.02*  --   --  3.50*   < > = values in this interval not displayed.    Estimated Creatinine Clearance: 19 mL/min (A) (by C-G formula based on SCr of 3.5 mg/dL (H)).   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   Conductive hearing loss    uses hearing aids   Dysrhythmia    A-fib   History of blood transfusion 03/24/2023   Hypothyroidism    Intraspinal abscess    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   Nonischemic cardiomyopathy (HCC)    Psychosexual dysfunction with inhibited sexual excitement    S/P mitral valve replacement    Syncope and collapse    Unspecified sleep apnea    last sleep study 11/07, uses cpap   Ventricular tachycardia (HCC)    VT (ventricular tachycardia) (HCC)      Medications:  Scheduled:   amiodarone  400 mg Oral BID   amoxicillin-clavulanate  1 tablet Oral BID   Chlorhexidine Gluconate Cloth  6 each Topical Daily   doxycycline  100 mg Oral Q12H   furosemide  80 mg Intravenous Once   levothyroxine  88 mcg Oral QAC breakfast   melatonin  5 mg Oral QHS   multivitamin with minerals  1 tablet Oral q AM   mupirocin ointment  1 Application Topical Daily   quiniDINE gluconate  324 mg Oral BID   sodium chloride flush  10-40 mL Intracatheter Q12H   sodium chloride flush  3 mL Intravenous Q12H   warfarin  5 mg Oral ONCE-1600   Warfarin - Pharmacist Dosing Inpatient   Does not apply q1600    Assessment: 82 yom presenting after ICD shocks - on enoxaparin and warfarin PTA (LD 9/10 PM). PTA regimen 2.5 mg daily except 5 mg MWF. Currently undergoing bridge given recent admission for Barostim implant complicated with L chest wall hematoma requiring I&D > barostim removed 9/17 and vac removed   INR 1.6 despite  boost warfarin dose last pm - will repeat - but cautiously (has been on hold for procedures)  Heparin level 0.29 at goal  on heparin drip 1400 uts/hr.  No s/sx of bleeding. Stable hgb but down trend pltc 120>89 - monitor  Procedures complete per VVS and EP  PTA warfarin 2.5mg  TTSS / 5mg  MMWF  Goal of Therapy:  INR 3-3.5  - when warfarin not on hold Heparin level: 0.3-0.5 Monitor platelets by anticoagulation protocol: Yes   Plan:  Continue heparin infusion 1400 units/hr  Warfarin 7.5mg  x1  Monitor daily Heparin level, protime, CBC, for s/sx of bleeding    Leota Sauers Pharm.D. CPP, BCPS Clinical Pharmacist [phone] 04/15/2023 9:10 AM

## 2023-04-15 NOTE — Consult Note (Signed)
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 Imaging noticed fluid collection at surgical site of baristim --> 03/25/23 baristim surgical site hematoma I&D and placement of abx beads and wound vac -- patient was also given doxy/augmentin (there was no wound culture done). Bcx at that time not done (no sign of sepsis)  This admission 04/12/23 removal of baristim and redo left carotid artery exposure. Cx pan-sensitive pseudomonas  Bcx this admission negative    Plan: Given with all data this appears to be isolated baristim surgical site/device infection and device removed we can treat with 10 days abx starting today 9/20 Cipro renally dosed for 10 days till 9/30 Coumadin level/inr will need to be monitored while on cipro and when off more closely Cardiology team will adjust the ppm rate to reduce qtc prolongation risk in setting of cipro use (which by itself is without significant risk for qtc prolongation) He has other cardiac devices but no where in vicinity of the infection and previously no blood cx obtained, so I would like to see him in clinic about 2 weeks after he finishes with the cipro to make sure no previously missed occult bacteremia Id clinic appointment on 10/22 @ 11am Will sign off for now Discussed with primary  team     ------------------------------------------------ Principal Problem:   Heart failure (HCC) Active Problems:   Ventricular tachycardia (HCC)    HPI: Austin [name] is a 82 y.o. 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 Imaging noticed fluid collection at surgical site of baristim --> 03/25/23 baristim surgical site hematoma I&D and placement of abx beads and wound vac -- patient was also given doxy/augmentin (there was no wound culture done). Bcx at that time not done (no sign of sepsis)  Since the chf admission he had had icd firing and vt runs and was readmitted for chf management   There was ongoing concern for infection so baristim was removed o 9/17. Operative site cx grow pseudomonas. Abx switched to cefepime  He has some focal pain at the surgical site but has no other complaint  Bcx this admission negative Previous admission didn't have bcx     Family History  Problem Relation Age of Onset   Heart disease Mother    Heart failure Mother    Heart disease Father    Heart failure Father    Sleep apnea Neg Hx  Social History   Tobacco Use   Smoking status: Never   Smokeless tobacco: Never  Vaping Use   Vaping status: Never Used  Substance Use Topics   Alcohol use: Not Currently    Comment: occasionally   Drug use: No    No Known Allergies  Review of Systems: ROS All Other ROS was negative, except mentioned above   Past Medical History:  Diagnosis Date    AICD (automatic cardioverter/defibrillator) present    Atrial fibrillation (HCC)    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    cx by infection, explantation11/12 & reimplant 1/13   CHF (congestive heart failure) (HCC)    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   Conductive hearing loss    uses hearing aids   Dysrhythmia    A-fib   History of blood transfusion 03/24/2023   Hypothyroidism    Intraspinal abscess    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   Nonischemic cardiomyopathy (HCC)    Psychosexual dysfunction with inhibited sexual excitement    S/P mitral valve replacement    Syncope and collapse    Unspecified sleep apnea    last sleep study 11/07, uses cpap   Ventricular tachycardia (HCC)    VT (ventricular tachycardia) (HCC)        Scheduled Meds:  amiodarone  400 mg Oral BID   Chlorhexidine Gluconate Cloth  6 each Topical Daily   [START ON 04/16/2023] ciprofloxacin  500 mg Oral Q breakfast   levothyroxine  88 mcg Oral QAC breakfast   melatonin  5 mg Oral QHS   multivitamin with minerals  1 tablet Oral q AM   mupirocin ointment  1 Application Topical Daily   quiniDINE gluconate  324 mg Oral BID   sodium chloride flush  10-40 mL Intracatheter Q12H   sodium chloride flush  3 mL Intravenous Q12H   warfarin  7.5 mg Oral ONCE-1600   Warfarin - Pharmacist Dosing Inpatient   Does not apply q1600   Continuous Infusions:  sodium chloride     heparin 1,400 Units/hr (04/15/23 1500)   PRN Meds:.sodium chloride, acetaminophen, HYDROcodone-acetaminophen, nitroGLYCERIN, ondansetron (ZOFRAN) IV, polyethylene glycol, sodium chloride flush, sodium chloride flush   OBJECTIVE: Blood pressure 118/83, pulse 84, temperature 97.7 F (36.5 C), temperature source Oral, resp. rate (!) 30, height 6\' 1"  (1.854 m), weight 91 kg, SpO2 94%.  Physical Exam  General/constitutional: no distress, pleasant HEENT: Normocephalic, PER,  Conj Clear, EOMI, Oropharynx clear Neck supple CV: paced -- regular rate/rhythm no mrg Lungs: clear to auscultation, normal respiratory effort Abd: Soft, Nontender Ext: bilateral LE edema noted outside of his unna boot wrapping Skin: No Rash -- left chest surgical site no dehiscence -- dressing c/d; no fluctuance there or erythema; mild tenderness; right chest icd site no sign of infection Neuro: nonfocal MSK: no peripheral joint swelling/tenderness/warmth; back spines nontender   Central line presence: left upper ext picc site no erythema/tenderness   Lab Results Lab Results  Component Value Date   WBC 9.8 04/15/2023   HGB 9.9 (L) 04/15/2023   HCT 31.9 (L) 04/15/2023   MCV 99.7 04/15/2023   PLT 95 (L) 04/15/2023    Lab Results  Component Value Date   CREATININE 3.50 (H) 04/15/2023   BUN 53 (H) 04/15/2023   NA 129 (L) 04/15/2023   K 4.2 04/15/2023   CL 86 (L) 04/15/2023   CO2 27 04/15/2023    Lab Results  Component  Value Date   ALT 24 04/06/2023   AST 30 04/06/2023   ALKPHOS 78 04/06/2023   BILITOT 1.4 (H) 04/06/2023      Microbiology: Recent Results (from the past 240 hour(s))  Blood culture (routine x 2)     Status: None   Collection Time: 04/06/23 10:26 AM   Specimen: BLOOD  Result Value Ref Range Status   Specimen Description BLOOD RIGHT ANTECUBITAL  Final   Special Requests   Final    BOTTLES DRAWN AEROBIC AND ANAEROBIC Blood Culture adequate volume   Culture   Final    NO GROWTH 5 DAYS Performed at [facility], [address] [place], [place] [address]    Report Status 04/11/2023 FINAL  Final  Culture, blood (single)     Status: None   Collection Time: 04/06/23 11:45 AM   Specimen: BLOOD RIGHT ARM  Result Value Ref Range Status   Specimen Description BLOOD RIGHT ARM  Final   Special Requests   Final    BOTTLES DRAWN AEROBIC AND ANAEROBIC Blood Culture adequate volume   Culture   Final    NO GROWTH 5 DAYS Performed at [facility]  Lab, [address], [place], [place] [address]    Report Status 04/11/2023 FINAL  Final  Surgical pcr screen     Status: None   Collection Time: 04/06/23 12:35 PM   Specimen: Nasal Mucosa; Nasal Swab  Result Value Ref Range Status   MRSA, PCR NEGATIVE NEGATIVE Final   Staphylococcus aureus NEGATIVE NEGATIVE Final    Comment: (NOTE) The Xpert SA Assay (FDA approved for NASAL specimens in patients 72 years of age and older), is one component of a comprehensive surveillance program. It is not intended to diagnose infection nor to guide or monitor treatment. Performed at [facility], [address], [place], [place] [address]   Aerobic/Anaerobic Culture w Gram Stain (surgical/deep wound)     Status: None (Preliminary result)   Collection Time: 04/12/23  4:46 PM   Specimen: Wound; Body Fluid  Result Value Ref Range Status   Specimen Description WOUND  Final   Special Requests BAROSTIM DEVICE WOUND FLUID FOR CULTURE  Final   Gram Stain   Final    NO WBC SEEN NO ORGANISMS SEEN Performed at [facility], [address], [place], [place] [address]    Culture   Final    RARE PSEUDOMONAS AERUGINOSA NO ANAEROBES ISOLATED; CULTURE IN PROGRESS FOR 5 DAYS    Report Status PENDING  Incomplete   Organism ID, Bacteria PSEUDOMONAS AERUGINOSA  Final      Susceptibility   Pseudomonas aeruginosa - MIC*    CEFTAZIDIME 2 SENSITIVE Sensitive     CIPROFLOXACIN <=0.25 SENSITIVE Sensitive     GENTAMICIN <=1 SENSITIVE Sensitive     IMIPENEM 2 SENSITIVE Sensitive     PIP/TAZO 8 SENSITIVE Sensitive     CEFEPIME 0.5 SENSITIVE Sensitive     * RARE PSEUDOMONAS AERUGINOSA  Aerobic/Anaerobic Culture w Gram Stain (surgical/deep wound)     Status: None (Preliminary result)   Collection Time: 04/12/23  5:33 PM   Specimen: Wound; Body Fluid  Result Value Ref Range Status   Specimen Description WOUND  Final   Special Requests WHITE SPONGE VAC  Final   Gram Stain   Final    RARE WBC PRESENT,BOTH PMN  AND MONONUCLEAR NO ORGANISMS SEEN Performed at [facility], [address] [place], [place] [address]  Culture   Final    RARE PSEUDOMONAS AERUGINOSA NO ANAEROBES ISOLATED; CULTURE IN PROGRESS FOR 5 DAYS    Report Status PENDING  Incomplete     Serology:    Imaging: If present, new imagings (plain films, ct scans, and mri) have been personally visualized and interpreted; radiology reports have been reviewed. Decision making incorporated into the Impression / Recommendations.  9/14 cxr FINDINGS: Single frontal view of the chest demonstrates interval placement of a left-sided PICC, tip overlying superior vena cava. 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 Stable multi lead AICD and left-sided nerve stimulator. Cardiac silhouette is unchanged. Continued central vascular congestion, with patchy bibasilar consolidation right greater than left increased since prior study. No effusion or pneumothorax. No acute bony abnormalities.   IMPRESSION: 1. Left-sided PICC as above. 2. Continued central vascular congestion, with progressive bibasilar consolidation favoring atelectasis.    9/12 tte  1. Left ventricular ejection fraction, by estimation, is 20%. The left ventricle has severely decreased function. The left ventricle demonstrates global hypokinesis. No LV thrombus noted. Indeterminant diastolic function.   2. Peak RV-RA gradient 19 mmHg. IVC not visualized. Right ventricular systolic function is mildly reduced. The right ventricular size is normal.   3. Mechanical mitral valve with mean gradient 8 mmHg, increased from prior. No mitral regurgitation noted though shadowing from prosthetic valve makes this difficult to discern.   4. Tricuspid valve regurgitation is moderate.   5. The aortic valve is tricuspid. There is moderate calcification of the aortic valve. Doppler evaluation of aortic valve not done.   6. Limited echo.   Raymondo Band,  MD [facility] [phone] pager    04/15/2023, 3:59 PM

## 2023-04-15 NOTE — Progress Notes (Addendum)
Patient ID: Austin [name], male   DOB: [birth year], 82 y.o.   MRN: [id number]     Advanced Heart Failure Rounding Note  PCP-Cardiologist: None   Subjective:    Patient was paced out of slow VT on 9/13.  Since then, has been in and out of slow VT.  Was out of VT on 9/14 but went back in slow VT 9/15 Back in slow VT for a couple of hours rate around 100.  When out of VT, he has had atrial tachycardia with intermittent RV pacing. EP saw and programmed ( RV lead only) to 100 . Switched to lasix drip and given metolazone. 9/16 Back in VT  9/17 VT Paced out. S/P Barostimulator removal.  9/19 Transitioned to PO amiodarone. Lasix gtt d/c  A-V Paced. No further VT   1.4L in UOP. Wt down 1 lb. SCr up 3.02>>3.50 today. Na 129. CVP 12. Co-ox 70%   Sitting up in bed. Denies dyspnea. No CP     Objective:   Weight Range: 91 kg Body mass index is 26.47 kg/m.   Vital Signs:   Temp:  [97.6 F (36.4 C)-97.9 F (36.6 C)] 97.6 F (36.4 C) (09/19 2300) Pulse Rate:  [84-90] 84 (09/20 0600) Resp:  [11-28] 14 (09/20 0600) BP: (99-121)/(66-103) 114/69 (09/20 0600) SpO2:  [90 %-100 %] 100 % (09/20 0600) Weight:  [91 kg] 91 kg (09/20 0600) Last BM Date : 04/12/23  Weight change: Filed Weights   04/13/23 0630 04/14/23 0630 04/15/23 0600  Weight: 90.4 kg 91.5 kg 91 kg    Intake/Output:   Intake/Output Summary (Last 24 hours) at 04/15/2023 0755 Last data filed at 04/15/2023 0400 Gross per 24 hour  Intake 928.59 ml  Output 1385 ml  Net -456.41 ml      Physical Exam   CVP 12  General:  Well appearing. No respiratory difficulty HEENT: normal Neck: supple. JVD 12 cm. Carotids 2+ bilat; no bruits. No lymphadenopathy or thyromegaly appreciated. Cor: PMI nondisplaced. Regular rate & rhythm. No rubs, gallops or murmurs. Lungs: decreased BS at the bases bilaterally  Abdomen: soft, nontender, nondistended. No hepatosplenomegaly. No bruits or masses. Good bowel sounds. Extremities: no  cyanosis, clubbing, rash, trace-1+ b/l LE edema,+ unna boots  Neuro: alert & oriented x 3, cranial nerves grossly intact. moves all 4 extremities w/o difficulty. Affect pleasant.  Telemetry   A-V paced 80s. No further VT (personally reviewed)  EKG    No new EKG to review   Labs    CBC Recent Labs    04/14/23 0444 04/15/23 0335  WBC 11.2* 9.9  HGB 9.4* 9.3*  HCT 30.8* 30.3*  MCV 99.0 97.1  PLT 101* 89*   Basic Metabolic Panel Recent Labs    78/29/56 0444 04/15/23 0335  NA 127* 129*  K 4.6 4.2  CL 89* 86*  CO2 26 27  GLUCOSE 182* 91  BUN 45* 53*  CREATININE 3.02* 3.50*  CALCIUM 8.2* 8.3*   Liver Function Tests No results for input(s): "AST", "ALT", "ALKPHOS", "BILITOT", "PROT", "ALBUMIN" in the last 72 hours.   No results for input(s): "LIPASE", "AMYLASE" in the last 72 hours. Cardiac Enzymes No results for input(s): "CKTOTAL", "CKMB", "CKMBINDEX", "TROPONINI" in the last 72 hours.  BNP: BNP (last 3 results) Recent Labs    02/03/23 1502 03/23/23 1045 04/06/23 1026  BNP 453.3* 352.5* 1,258.7*    ProBNP (last 3 results) Recent Labs    12/16/22 0959  PROBNP 3,692*     D-Dimer No  results for input(s): "DDIMER" in the last 72 hours. Hemoglobin A1C No results for input(s): "HGBA1C" in the last 72 hours. Fasting Lipid Panel No results for input(s): "CHOL", "HDL", "LDLCALC", "TRIG", "CHOLHDL", "LDLDIRECT" in the last 72 hours. Thyroid Function Tests No results for input(s): "TSH", "T4TOTAL", "T3FREE", "THYROIDAB" in the last 72 hours.  Invalid input(s): "FREET3"   Other results:   Imaging    No results found.   Medications:     Scheduled Medications:  amiodarone  400 mg Oral BID   amoxicillin-clavulanate  1 tablet Oral BID   Chlorhexidine Gluconate Cloth  6 each Topical Daily   doxycycline  100 mg Oral Q12H   levothyroxine  88 mcg Oral QAC breakfast   melatonin  5 mg Oral QHS   multivitamin with minerals  1 tablet Oral q AM    mupirocin ointment  1 Application Topical Daily   quiniDINE gluconate  324 mg Oral BID   sodium chloride flush  10-40 mL Intracatheter Q12H   sodium chloride flush  3 mL Intravenous Q12H   Warfarin - Pharmacist Dosing Inpatient   Does not apply q1600    Infusions:  sodium chloride     heparin 1,400 Units/hr (04/15/23 0400)    PRN Medications: sodium chloride, acetaminophen, HYDROcodone-acetaminophen, nitroGLYCERIN, ondansetron (ZOFRAN) IV, polyethylene glycol, sodium chloride flush, sodium chloride flush    Patient Profile   82 y/o male w/ 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    Now readmitted for ICD shocks for VT and a/c CHF.  Assessment/Plan   1. VT: History of VT and VT ablation in 2017.  MDT ICD.  Redo VT ablation in 1/23 (Dr. Marquette Saa).  He is on amiodarone, this was increased to 200 mg daily as outpatient with recurrent VT.  He has tolerated amiodarone poorly historically but is doing ok with it today.  In 7/23, slow VT was pace-terminated in the office, then he had an ICD shock for VT later in 7/23.  He was started on quinidine after this.  He is followed by Dr. Ladona Ridgel.  Now with 2 further ICD shocks for VT prior to this admission, had been in slow VT rate 100s-110s this admission.  Paced out of VT 9/13 by EP, since then has been in and out of slow VT.  At baseline, he RV paces most of the time. EP decreased pacing rate to 85 bpm. He has remained out of VT over the last 2 days. Now on PO amio, stable.  - Continue PO amio 400 mg bid   - Continue quinidine.  2. Acute on chronic systolic CHF: Long-standing, suspected nonischemic cardiomyopathy.  Had RHC on 02/26/21 with normal filling pressures and mildly low cardiac output.   Echo in 4/23 showed  EF 20-25% with diffuse hypokinesis and septal-lateral dyssynchrony, mildly decreased RV systolic function, mechanical MV with normal function, dilated IVC.  He has a [employer] CRT-D device present, but the LV lead is not functional.  He has complete heart block, so has been RV paced chronically with a wide QRS.  Has had difficulty tolerating GDMT historically with CKD and hypotension.  Currently off his home GDMT due to low BP and elevated creatinine.  Echo this admission with EF 20%, mechanical MV mean gradient 8 mmHg.  Co-ox  70% today, CVP 12.  He had been on Lasix gtt @12  mg/hr but discontinued yesterday as scr continued to rise. Now off diuretics. CVP 12 today. SCr continues to trend up, 3.0>>3.50.  - ? Hold diuretics 1 more day to see if renal fx stabilizes. May have to tolerate higher CVPs given RV dysfunction  - Will not use inotrope with frequent VT and stable co-ox - Spironolactone, valsartan, and Toprol XL on hold for now with low SBP and AKI.  - Off digoxin due to persistently elevated level.  - He did not tolerate SGLT2 inhibitor in the past.  - The patient has CHB and has been chronically RV pacing as his LV lead is nonfunctional.  This is not ideal and creates dyssynchrony with a very wide QRS.  Left bundle lead placement discussed in past with Dr. Ladona Ridgel but thought risk would be too high and left bundle lead was not attempted.   - Barostimulator device placed but complicated by chest wall hematoma as below and has been removed.  3. Atrial fibrillation/flutter, atrial tachycardia: Paroxysmal.  This admission, when out of VT, he has been in atrial tachycardia. However, this morning, he is out of AT and is v-pacing.  - Continue amiodarone - On Heparin drip bridging to therapeutic INR.  4. Complete heart block: Patient is pacer dependent due to CHB.  - See above, upgrade to functional CRT thought to be too risky due to need to extract old CS lead. 5. OSA: Severe.  Continue CPAP 6. Mechanical mitral valve: Mean gradient higher on echo this admission at 8 mmHg. INR pending.  - Heparin gtt bridging for warfarin INR goal 2.5-3.5.  7. Chest Wall Hematoma: post barostimulator complication, in setting of coumadin for mechanical MV.  s/p I&D for evacuation of hematoma and placement of wound vac + placement of antibiotic beads on 8/30. Barostimulator device removed 9/17, still has drain in place.  - On Augmentin.  - wound vac management per [name]  - vascular following.   8. AKI on CKD stage 3: Creatinine baseline 1.5-2.  2.7=>2.38=>2.68 => 3=>3.50  today. Co-ox ok 70% - continue to hold diuretics for now.   Length of Stay: [address], PA-C  04/15/2023, 7:55 AM  Advanced Heart Failure Team Pager [phone] (M-F; 7a - 5p)  Please contact [facility] for night-coverage after hours (5p -7a ) and weekends on [email or web address]  Patient seen with PA, agree with the above note.   INR 1.6 today, remains on heparin/warfarin.  Platelets lower today at 89.   Some oozing from left chest surgical site, re-dressed by vascular today.   Creatinine trending up at 3.5, Lasix gtt stopped yesterday.  Unfortunately, CVP up to 17-18 today.  Co-ox 70%.    She remains A-V sequentially paced today, out of VT.  Now on po amiodarone.   General: NAD Neck: JVP 12 cm, no thyromegaly or thyroid nodule.  Lungs: Decreased at bases.  CV: Nondisplaced PMI.  Heart regular S1/S2, with mechanical S1, 1/6 SEM RUSB.  1+ edema 1/2 to knees bilaterally.   Abdomen: Soft, nontender, no hepatosplenomegaly, no distention.  Skin: Intact without lesions or rashes.  Neurologic: Alert and oriented x 3.  Psych: Normal affect. Extremities: No clubbing or cyanosis.  HEENT: Normal.   He is staying out of both VT and atrial tachycardia on amiodarone 400 bid and quinidine.  Continue these meds per EP.   INR still low at 1.6, on heparin/warfarin overlap for mechanical MV and atrial  arrhythmias.  Platelets trending down.  I reviewed with pharmacy, will not send HIT workup yet but repeat CBC in pm.   Unfortunately, creatinine continues to rise to 3.5 with volume overload (CVP 17-18 today on my read).  We stopped Lasix gtt yesterday morning.  Co-ox 70%, and also trying to hold off on inotropes with very frequent VT. I will give 1 dose of Lasix 80 mg IV today and follow response and renal function tomorrow.   He is very eager to go home tomorrow but I suspect he is not going to be ready.   Marca Ancona 04/15/2023 9:27 AM  Wound culture grew Pseudomonas, was called today.  Will need to start cefepime.  Consult ID.    Marca Ancona 04/15/2023 2:06 PM

## 2023-04-15 NOTE — Progress Notes (Addendum)
  Progress Note    04/15/2023 7:57 AM 3 Days Post-Op  Subjective:  feeling fine     Vitals:   04/15/23 0500 04/15/23 0600  BP: 115/80 114/69  Pulse: 85 84  Resp: 11 14  Temp:    SpO2: 99% 100%    Physical Exam: General:  sitting up in bed Lungs:  nonlabored Incisions:  L sided chest incision bandaged and dry. L sided chest JP drain with 35cc output  CBC    Component Value Date/Time   WBC 9.9 04/15/2023 0335   RBC 3.12 (L) 04/15/2023 0335   HGB 9.3 (L) 04/15/2023 0335   HGB 16.6 12/04/2020 1445   HCT 30.3 (L) 04/15/2023 0335   HCT 50.1 12/04/2020 1445   PLT 89 (L) 04/15/2023 0335   PLT 158 12/04/2020 1445   MCV 97.1 04/15/2023 0335   MCV 94 12/04/2020 1445   MCH 29.8 04/15/2023 0335   MCHC 30.7 04/15/2023 0335   RDW 18.9 (H) 04/15/2023 0335   RDW 13.7 12/04/2020 1445   LYMPHSABS 0.3 (L) 04/06/2023 1030   MONOABS 1.1 (H) 04/06/2023 1030   EOSABS 0.1 04/06/2023 1030   BASOSABS 0.1 04/06/2023 1030    BMET    Component Value Date/Time   NA 129 (L) 04/15/2023 0335   NA 139 01/29/2021 0853   K 4.2 04/15/2023 0335   CL 86 (L) 04/15/2023 0335   CO2 27 04/15/2023 0335   GLUCOSE 91 04/15/2023 0335   BUN 53 (H) 04/15/2023 0335   BUN 18 01/29/2021 0853   CREATININE 3.50 (H) 04/15/2023 0335   CREATININE 1.22 (H) 09/12/2015 0846   CALCIUM 8.3 (L) 04/15/2023 0335   GFRNONAA 17 (L) 04/15/2023 0335   GFRAA 56 (L) 04/30/2020 1144    INR    Component Value Date/Time   INR 1.6 (H) 04/14/2023 0444     Intake/Output Summary (Last 24 hours) at 04/15/2023 0757 Last data filed at 04/15/2023 0400 Gross per 24 hour  Intake 928.59 ml  Output 1385 ml  Net -456.41 ml      Assessment/Plan:  82 y.o. male is 3 days post op, s/p:    -No issues overnight. Still feeling great this morning. No pain at incision sites -L sided chest and neck incisions are intact and dry -Chest drain with 35cc output in 24 hours, will remain   [name], PA-C [facility] [phone] 04/15/2023 7:57 AM  I agree with the above.  Have seen and evaluated the patient.  His surgical dressing was removed today.  The incision is healing appropriately.  There is slight oozing.  It was painted with Betadine and redressed.  He had 35 cc of drainage output over the past 24 hours.  Drain should remain in place  [employer]

## 2023-04-16 DIAGNOSIS — I5023 Acute on chronic systolic (congestive) heart failure: Secondary | ICD-10-CM | POA: Diagnosis not present

## 2023-04-16 LAB — TYPE AND SCREEN
ABO/RH(D): A POS
Antibody Screen: NEGATIVE
Unit division: 0
Unit division: 0

## 2023-04-16 LAB — BPAM RBC
Blood Product Expiration Date: 202410122359
Blood Product Expiration Date: 202410162359
Unit Type and Rh: 6200
Unit Type and Rh: 6200

## 2023-04-16 LAB — CBC
HCT: 29.7 % — ABNORMAL LOW (ref 39.0–52.0)
Hemoglobin: 9.4 g/dL — ABNORMAL LOW (ref 13.0–17.0)
MCH: 30.5 pg (ref 26.0–34.0)
MCHC: 31.6 g/dL (ref 30.0–36.0)
MCV: 96.4 fL (ref 80.0–100.0)
Platelets: 90 10*3/uL — ABNORMAL LOW (ref 150–400)
RBC: 3.08 MIL/uL — ABNORMAL LOW (ref 4.22–5.81)
RDW: 19 % — ABNORMAL HIGH (ref 11.5–15.5)
WBC: 7.9 10*3/uL (ref 4.0–10.5)
nRBC: 0.4 % — ABNORMAL HIGH (ref 0.0–0.2)

## 2023-04-16 LAB — BASIC METABOLIC PANEL
Anion gap: 13 (ref 5–15)
BUN: 57 mg/dL — ABNORMAL HIGH (ref 8–23)
CO2: 26 mmol/L (ref 22–32)
Calcium: 8.2 mg/dL — ABNORMAL LOW (ref 8.9–10.3)
Chloride: 90 mmol/L — ABNORMAL LOW (ref 98–111)
Creatinine, Ser: 3.77 mg/dL — ABNORMAL HIGH (ref 0.61–1.24)
GFR, Estimated: 15 mL/min — ABNORMAL LOW (ref >60)
Glucose, Bld: 90 mg/dL (ref 70–99)
Potassium: 3.7 mmol/L (ref 3.5–5.1)
Sodium: 129 mmol/L — ABNORMAL LOW (ref 135–145)

## 2023-04-16 LAB — PROTIME-INR
INR: 2 — ABNORMAL HIGH (ref 0.8–1.2)
Prothrombin Time: 23.3 seconds — ABNORMAL HIGH (ref 11.4–15.2)

## 2023-04-16 LAB — COOXEMETRY PANEL
Carboxyhemoglobin: 2.1 % — ABNORMAL HIGH (ref 0.5–1.5)
Methemoglobin: <0.7 % (ref 0.0–1.5)
O2 Saturation: 63.1 %
Total hemoglobin: 10 g/dL — ABNORMAL LOW (ref 12.0–16.0)

## 2023-04-16 LAB — HEPARIN LEVEL (UNFRACTIONATED): Heparin Unfractionated: 0.26 IU/mL — ABNORMAL LOW (ref 0.30–0.70)

## 2023-04-16 MED ORDER — FUROSEMIDE 10 MG/ML IJ SOLN
160.0000 mg | Freq: Two times a day (BID) | INTRAVENOUS | Status: AC
  Administered 2023-04-16 – 2023-04-17 (×4): 160 mg via INTRAVENOUS
  Filled 2023-04-16: qty 10
  Filled 2023-04-16 (×5): qty 16

## 2023-04-16 MED ORDER — WARFARIN SODIUM 5 MG PO TABS
5.0000 mg | ORAL_TABLET | Freq: Once | ORAL | Status: AC
  Administered 2023-04-16: 5 mg via ORAL
  Filled 2023-04-16: qty 1

## 2023-04-16 MED ORDER — ORAL CARE MOUTH RINSE: 15.0000 mL | OROMUCOSAL | Status: DC | PRN

## 2023-04-16 NOTE — Progress Notes (Signed)
ANTICOAGULATION CONSULT NOTE - Follow Up  Pharmacy Consult for heparin > warfarin  Indication: atrial fibrillation and mechanical mitral valve   No Known Allergies  Patient Measurements: Height: 6\' 1"  (185.4 cm) Weight: 92.1 kg (203 lb 0.7 oz) IBW/kg (Calculated) : 79.9 Heparin Dosing Weight: 94.3 kg   Vital Signs: Temp: 97.7 F (36.5 C) (09/21 0800) Temp Source: Oral (09/21 0800) BP: 110/65 (09/21 0900) Pulse Rate: 84 (09/21 0900)  Labs: Recent Labs    04/14/23 0444 04/14/23 0847 04/15/23 0330 04/15/23 0335 04/15/23 1424 04/16/23 0420  HGB 9.4*  --   --  9.3* 9.9* 9.4*  HCT 30.8*  --   --  30.3* 31.9* 29.7*  PLT 101*  --   --  89* 95* 90*  LABPROT 19.4*  --  19.2*  --   --  23.3*  INR 1.6*  --  1.6*  --   --  2.0*  HEPARINUNFRC 1.03* 0.34 0.29*  --   --  0.26*  CREATININE 3.02*  --   --  3.50*  --  3.77*    Estimated Creatinine Clearance: 17.7 mL/min (A) (by C-G formula based on SCr of 3.77 mg/dL (H)).   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   Conductive hearing loss    uses hearing aids   Dysrhythmia    A-fib   History of blood transfusion 03/24/2023   Hypothyroidism    Intraspinal abscess    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   Nonischemic cardiomyopathy (HCC)    Psychosexual dysfunction with inhibited sexual excitement    S/P mitral valve replacement    Syncope and collapse    Unspecified sleep apnea    last sleep study 11/07, uses cpap   Ventricular tachycardia (HCC)    VT (ventricular tachycardia) (HCC)     Medications:  Scheduled:   amiodarone  400 mg Oral BID   Chlorhexidine Gluconate Cloth  6 each  Topical Daily   ciprofloxacin  500 mg Oral Q breakfast   levothyroxine  88 mcg Oral QAC breakfast   melatonin  5 mg Oral QHS   multivitamin with minerals  1 tablet Oral q AM   mupirocin ointment  1 Application Topical Daily   quiniDINE gluconate  324 mg Oral BID   sodium chloride flush  10-40 mL Intracatheter Q12H   sodium chloride flush  3 mL Intravenous Q12H   Warfarin - Pharmacist Dosing Inpatient   Does not apply q1600    Assessment: 82 yom presenting after ICD shocks - on enoxaparin and warfarin PTA (LD 9/10 PM). PTA regimen 2.5 mg daily except 5 mg MWF. Currently undergoing bridge given recent admission for Barostim implant complicated with L chest wall hematoma requiring I&D > barostim removed 9/17 and vac removed.  INR today is 2 and below goal, heparin level is slightly subtherapeutic at 0.26, drawn from CVC infusing in PICC.  PTA warfarin 2.5mg  TTSS / 5mg  MMWF  Goal of Therapy:  INR 3-3.5  - when warfarin not on hold Heparin level: 0.3-0.5 Monitor platelets by anticoagulation protocol: Yes   Plan:  Increase heparin infusion 1500 units/hr  Warfarin 5mg  x1  Monitor daily Heparin  level, protime, CBC, for s/sx of bleeding    Fredonia Highland, PharmD, BCPS, [facility] Clinical Pharmacist [phone] Please check AMION for all [facility] Pharmacy numbers 04/16/2023

## 2023-04-16 NOTE — Progress Notes (Signed)
Patient ID: Austin [name], male   DOB: 1941/01/15, 82 y.o.   MRN: [id number]     Advanced Heart Failure Rounding Note  PCP-Cardiologist: None   Subjective:    Patient was paced out of slow VT on 9/13.  Since then, has been in and out of slow VT.  Was out of VT on 9/14 but went back in slow VT 9/15 Back in slow VT for a couple of hours rate around 100.  When out of VT, he has had atrial tachycardia with intermittent RV pacing. EP saw and programmed ( RV lead only) to 100 . Switched to lasix drip and given metolazone. 9/16 Back in VT  9/17 VT Paced out. S/P Barostimulator removal.  9/19 Transitioned to PO amiodarone. Lasix gtt d/c 9/20 wound cx + pseudomonas. Cipro started. 9/20 paced out of VT   Foley placed overnight due to poor urine output 60-700cc out. Has had 225 of concentrated urine since  Off inotropes due to VT. Lasix stopped due to AKI. Co-ox 63% CVP 14 Scr 3.5 -> 3.8  A-V Paced. No further VT   Feels weak. Denies SOB. Weeping from upper extremities    Objective:   Weight Range: 92.1 kg Body mass index is 26.79 kg/m.   Vital Signs:   Temp:  [97.7 F (36.5 C)-98.2 F (36.8 C)] 97.7 F (36.5 C) (09/21 0800) Pulse Rate:  [83-85] 84 (09/21 0900) Resp:  [11-30] 16 (09/21 0900) BP: (94-142)/(58-116) 110/65 (09/21 0900) SpO2:  [88 %-100 %] 97 % (09/21 0900) Weight:  [92.1 kg] 92.1 kg (09/21 0500) Last BM Date : 04/15/23  Weight change: Filed Weights   04/14/23 0630 04/15/23 0600 04/16/23 0500  Weight: 91.5 kg 91 kg 92.1 kg    Intake/Output:   Intake/Output Summary (Last 24 hours) at 04/16/2023 1032 Last data filed at 04/16/2023 0800 Gross per 24 hour  Intake 506.84 ml  Output 1300 ml  Net -793.16 ml      Physical Exam   General:  Elderly. Chronically-ill appearing male Sitting up in bed  No resp difficulty HEENT: normal Neck: supple. no JVD. Carotids 2+ bilat; no bruits. No lymphadenopathy or thryomegaly appreciated. Cor: Chest wall dressing. +  JP drain Regular rate & rhythm. Mechanical s1 Lungs: clear Abdomen: soft, nontender, nondistended. No hepatosplenomegaly. No bruits or masses. Good bowel sounds. Extremities: no cyanosis, clubbing, rash, 3+ edema in thighs + UNNA UE weeping Neuro: alert & orientedx3, cranial nerves grossly intact. moves all 4 extremities w/o difficulty. Affect pleasant   Telemetry   A-V paced 80sNo further VT (personally reviewed)  EKG    No new EKG to review   Labs    CBC Recent Labs    04/15/23 1424 04/16/23 0420  WBC 9.8 7.9  HGB 9.9* 9.4*  HCT 31.9* 29.7*  MCV 99.7 96.4  PLT 95* 90*   Basic Metabolic Panel Recent Labs    25/36/64 0335 04/16/23 0420  NA 129* 129*  K 4.2 3.7  CL 86* 90*  CO2 27 26  GLUCOSE 91 90  BUN 53* 57*  CREATININE 3.50* 3.77*  CALCIUM 8.3* 8.2*   Liver Function Tests No results for input(s): "AST", "ALT", "ALKPHOS", "BILITOT", "PROT", "ALBUMIN" in the last 72 hours.   No results for input(s): "LIPASE", "AMYLASE" in the last 72 hours. Cardiac Enzymes No results for input(s): "CKTOTAL", "CKMB", "CKMBINDEX", "TROPONINI" in the last 72 hours.  BNP: BNP (last 3 results) Recent Labs    02/03/23 1502 03/23/23 1045 04/06/23 1026  BNP 453.3* 352.5* 1,258.7*    ProBNP (last 3 results) Recent Labs    12/16/22 0959  PROBNP 3,692*     D-Dimer No results for input(s): "DDIMER" in the last 72 hours. Hemoglobin A1C No results for input(s): "HGBA1C" in the last 72 hours. Fasting Lipid Panel No results for input(s): "CHOL", "HDL", "LDLCALC", "TRIG", "CHOLHDL", "LDLDIRECT" in the last 72 hours. Thyroid Function Tests No results for input(s): "TSH", "T4TOTAL", "T3FREE", "THYROIDAB" in the last 72 hours.  Invalid input(s): "FREET3"   Other results:   Imaging    No results found.   Medications:     Scheduled Medications:  amiodarone  400 mg Oral BID   Chlorhexidine Gluconate Cloth  6 each Topical Daily   ciprofloxacin  500 mg Oral Q  breakfast   levothyroxine  88 mcg Oral QAC breakfast   melatonin  5 mg Oral QHS   multivitamin with minerals  1 tablet Oral q AM   mupirocin ointment  1 Application Topical Daily   quiniDINE gluconate  324 mg Oral BID   sodium chloride flush  10-40 mL Intracatheter Q12H   sodium chloride flush  3 mL Intravenous Q12H   Warfarin - Pharmacist Dosing Inpatient   Does not apply q1600    Infusions:  sodium chloride     heparin 1,400 Units/hr (04/16/23 0800)    PRN Medications: sodium chloride, acetaminophen, HYDROcodone-acetaminophen, nitroGLYCERIN, ondansetron (ZOFRAN) IV, mouth rinse, polyethylene glycol, sodium chloride flush, sodium chloride flush    Patient Profile   82 y/o male w/ 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    Now readmitted for ICD shocks for VT and a/c CHF.  Assessment/Plan   1. VT: History of VT and VT ablation in 2017.  MDT ICD.  Redo VT ablation in 1/23 (Dr. Marquette Saa).  He is on amiodarone, this was increased to 200 mg daily as outpatient with recurrent VT.  He has tolerated amiodarone poorly historically but is doing ok with it today.  In 7/23, slow VT was pace-terminated in the office, then he had an ICD shock for VT later in 7/23.  He was started on quinidine after this.  He is followed by Dr. Ladona [name].  Now with 2 further ICD shocks for VT prior to this admission, had been in slow VT rate 100s-110s this admission.  Paced out of VT 9/13 by EP, since then has been in and out of slow VT.  At baseline, he RV paces most of the time. EP decreased pacing rate to 85 bpm. He has remained out of VT over the last 2 days. Now on PO amio, stable.  - Continue PO amio 400 mg bid   - Continue quinidine.  2. Acute on chronic  systolic CHF: Long-standing, suspected nonischemic cardiomyopathy.  Had RHC on 02/26/21 with normal filling pressures and mildly low cardiac output.  Echo in 4/23 showed  EF 20-25% with diffuse hypokinesis and septal-lateral dyssynchrony, mildly decreased RV systolic function, mechanical MV with normal function, dilated IVC.  He has a [employer] CRT-D device present, but the LV lead is not functional.  He has complete heart block, so has been RV paced chronically with a wide QRS.  Has had difficulty tolerating GDMT historically with CKD and hypotension.  Currently off his home GDMT  due to low BP and elevated creatinine.  Echo this admission with EF 20%, mechanical MV mean gradient 8 mmHg.  Co-ox 70% today, CVP 12.  He had been on Lasix gtt @12  mg/hr but discontinued 9/19 (got 80 IV x1 on 9/20) as scr continued to rise. Now off diuretics. CVP 14 today. SCr continues to trend up, 3.0>>3.50> 3.8 - Co-ox 63% today.  - He has marked peripheral edema in setting of biventricular HF (R>L). Now with worsening CKD despite adequate co-ox and BP. Have been holding inotropes due to recurrent VT.  - Off Spironolactone, valsartan, and Toprol XL  with low SBP and AKI.  - Off digoxin due to persistently elevated level.  - He did not tolerate SGLT2 inhibitor in the past.  - Will try 160 IV lasix bid today and see response. I am concerned that he has end-stage biventricular HF and progressive renal failure. Likely not candidate for HD given age and end-stage HF. May be headed toward Palliative Care pathway. I discussed this with him today. Both he and his wife live in independent living facility currently. Would consider Palliative Care consultation.  - The patient has CHB and has been chronically RV pacing as his LV lead is nonfunctional.  This is not ideal and creates dyssynchrony with a very wide QRS.  Left bundle lead placement discussed in past with Dr. Ladona [name] but thought risk would be too high and left bundle lead was not  attempted.   - Barostimulator device placed but complicated by chest wall hematoma as below and has been removed.  3. Atrial fibrillation/flutter, atrial tachycardia: Paroxysmal.  This admission, when out of VT, he has been in atrial tachycardia. However, this morning, he is out of AT and is v-pacing.  - Continue amiodarone - On Heparin drip bridging to therapeutic INR.  4. Complete heart block: Patient is pacer dependent due to CHB.  - See above, upgrade to functional CRT thought to be too risky due to need to extract old CS lead and current bloodstream infectio  5. OSA: Severe. Continue CPAP 6. Mechanical mitral valve: Mean gradient higher on echo this admission at 8 mmHg. INR 2.0 - Heparin gtt bridging for warfarin INR goal 2.5-3.5.  - Discussed dosing with PharmD personally. Will need to follow closely with amio and cipro.  7. Chest Wall Hematoma: post barostimulator complication, in setting of coumadin for mechanical MV.  s/p I&D for evacuation of hematoma and placement of wound vac + placement of antibiotic beads on 8/30. Barostimulator device removed 9/17, still has drain in place. Wound cx  + PSA - ID has seen. Now on Cipro - wound vac management per [name]  - vascular following.   8. AKI on CKD stage 3: Creatinine baseline 1.5-2.  2.7=>2.38=>2.68 => 3=>3.50=> 3.8  today. Co-ox ok 63% - see plan as above - If Scr continues to climb can consider discussing options with Renal but likely not candidate for HD given age and end-stage HF. May be headed toward Palliative Care pathway   CRITICAL CARE Performed by: Arvilla Meres  Total critical care time: 45 minutes  Critical care time was exclusive of separately billable procedures and treating other patients.  Critical care was necessary to treat or prevent imminent or life-threatening deterioration.  Critical care was time spent personally by me (independent of midlevel providers or residents) on the following activities:  development of treatment plan with patient and/or surrogate as well as nursing, discussions with consultants, evaluation of patient's response to  treatment, examination of patient, obtaining history from patient or surrogate, ordering and performing treatments and interventions, ordering and review of laboratory studies, ordering and review of radiographic studies, pulse oximetry and re-evaluation of patient's condition.   Length of Stay: 10  Arvilla Meres, MD  04/16/2023, 10:32 AM  Advanced Heart Failure Team Pager [phone] (M-F; 7a - 5p)  Please contact [facility] for night-coverage after hours (5p -7a ) and weekends on [email or web address]

## 2023-04-16 NOTE — Progress Notes (Signed)
    Subjective  - POD #3, s/p Barostim removal  Feels little better this morning   Physical Exam:  Chest wall incision clean and dry   JP drainage: 20 cc     Assessment/Plan:  POD #2  Wound cultures grew out Pseudomonas, for which she is now on antibiotics. Continue with JP drainage until output is minimal  Austin [name] 04/16/2023 9:32 AM --  Vitals:   04/16/23 0800 04/16/23 0900  BP: 113/68 110/65  Pulse: 84 84  Resp: 14 16  Temp: 97.7 F (36.5 C)   SpO2: 97% 97%    Intake/Output Summary (Last 24 hours) at 04/16/2023 0932 Last data filed at 04/16/2023 0800 Gross per 24 hour  Intake 520.81 ml  Output 1300 ml  Net -779.19 ml     Laboratory CBC    Component Value Date/Time   WBC 7.9 04/16/2023 0420   HGB 9.4 (L) 04/16/2023 0420   HGB 16.6 12/04/2020 1445   HCT 29.7 (L) 04/16/2023 0420   HCT 50.1 12/04/2020 1445   PLT 90 (L) 04/16/2023 0420   PLT 158 12/04/2020 1445    BMET    Component Value Date/Time   NA 129 (L) 04/16/2023 0420   NA 139 01/29/2021 0853   K 3.7 04/16/2023 0420   CL 90 (L) 04/16/2023 0420   CO2 26 04/16/2023 0420   GLUCOSE 90 04/16/2023 0420   BUN 57 (H) 04/16/2023 0420   BUN 18 01/29/2021 0853   CREATININE 3.77 (H) 04/16/2023 0420   CREATININE 1.22 (H) 09/12/2015 0846   CALCIUM 8.2 (L) 04/16/2023 0420   GFRNONAA 15 (L) 04/16/2023 0420   GFRAA 56 (L) 04/30/2020 1144    COAG Lab Results  Component Value Date   INR 2.0 (H) 04/16/2023   INR 1.6 (H) 04/15/2023   INR 1.6 (H) 04/14/2023   No results found for: "PTT"  Antibiotics Anti-infectives (From admission, onward)    Start     Dose/Rate Route Frequency Ordered Stop   04/16/23 0800  ciprofloxacin (CIPRO) tablet 500 mg        500 mg Oral Daily with breakfast 04/15/23 1528     04/15/23 1500  ceFEPIme (MAXIPIME) 2 g in sodium chloride 0.9 % 100 mL IVPB  Status:  Discontinued        2 g 200 mL/hr over 30 Minutes Intravenous Every 24 hours 04/15/23 1348 04/15/23 1528    04/11/23 1000  amoxicillin-clavulanate (AUGMENTIN) 500-125 MG per tablet 1 tablet  Status:  Discontinued        1 tablet Oral 2 times daily 04/11/23 0749 04/15/23 1348   04/06/23 2200  amoxicillin-clavulanate (AUGMENTIN) 875-125 MG per tablet 1 tablet  Status:  Discontinued        1 tablet Oral Every 12 hours 04/06/23 1400 04/11/23 0749   04/06/23 1330  doxycycline (VIBRA-TABS) tablet 100 mg  Status:  Discontinued        100 mg Oral Every 12 hours 04/06/23 1243 04/15/23 1350        V. Charlena Cross, M.D., [facility] [place] Office: [phone] Pager:  [phone]

## 2023-04-17 DIAGNOSIS — I5023 Acute on chronic systolic (congestive) heart failure: Secondary | ICD-10-CM | POA: Diagnosis not present

## 2023-04-17 LAB — BASIC METABOLIC PANEL
Anion gap: 14 (ref 5–15)
Anion gap: 12 (ref 5–15)
BUN: 51 mg/dL — ABNORMAL HIGH (ref 8–23)
BUN: 51 mg/dL — ABNORMAL HIGH (ref 8–23)
CO2: 27 mmol/L (ref 22–32)
CO2: 28 mmol/L (ref 22–32)
Calcium: 8.4 mg/dL — ABNORMAL LOW (ref 8.9–10.3)
Calcium: 8.4 mg/dL — ABNORMAL LOW (ref 8.9–10.3)
Chloride: 88 mmol/L — ABNORMAL LOW (ref 98–111)
Chloride: 89 mmol/L — ABNORMAL LOW (ref 98–111)
Creatinine, Ser: 3.62 mg/dL — ABNORMAL HIGH (ref 0.61–1.24)
Creatinine, Ser: 3.49 mg/dL — ABNORMAL HIGH (ref 0.61–1.24)
GFR, Estimated: 16 mL/min — ABNORMAL LOW (ref >60)
GFR, Estimated: 17 mL/min — ABNORMAL LOW (ref >60)
Glucose, Bld: 96 mg/dL (ref 70–99)
Glucose, Bld: 131 mg/dL — ABNORMAL HIGH (ref 70–99)
Potassium: 3.5 mmol/L (ref 3.5–5.1)
Potassium: 3.8 mmol/L (ref 3.5–5.1)
Sodium: 129 mmol/L — ABNORMAL LOW (ref 135–145)
Sodium: 129 mmol/L — ABNORMAL LOW (ref 135–145)

## 2023-04-17 LAB — CBC
HCT: 29.7 % — ABNORMAL LOW (ref 39.0–52.0)
Hemoglobin: 9.3 g/dL — ABNORMAL LOW (ref 13.0–17.0)
MCH: 30 pg (ref 26.0–34.0)
MCHC: 31.3 g/dL (ref 30.0–36.0)
MCV: 95.8 fL (ref 80.0–100.0)
Platelets: 93 10*3/uL — ABNORMAL LOW (ref 150–400)
RBC: 3.1 MIL/uL — ABNORMAL LOW (ref 4.22–5.81)
RDW: 18.9 % — ABNORMAL HIGH (ref 11.5–15.5)
WBC: 7.5 10*3/uL (ref 4.0–10.5)
nRBC: 0.3 % — ABNORMAL HIGH (ref 0.0–0.2)

## 2023-04-17 LAB — AEROBIC/ANAEROBIC CULTURE W GRAM STAIN (SURGICAL/DEEP WOUND): Gram Stain: NONE SEEN

## 2023-04-17 LAB — HEPARIN LEVEL (UNFRACTIONATED): Heparin Unfractionated: 0.35 IU/mL (ref 0.30–0.70)

## 2023-04-17 LAB — COOXEMETRY PANEL
Carboxyhemoglobin: 2.3 % — ABNORMAL HIGH (ref 0.5–1.5)
Methemoglobin: <0.7 % (ref 0.0–1.5)
O2 Saturation: 68.8 %
Total hemoglobin: 9.9 g/dL — ABNORMAL LOW (ref 12.0–16.0)

## 2023-04-17 LAB — PROTIME-INR
INR: 2.6 — ABNORMAL HIGH (ref 0.8–1.2)
Prothrombin Time: 28.2 seconds — ABNORMAL HIGH (ref 11.4–15.2)

## 2023-04-17 MED ORDER — POTASSIUM CHLORIDE CRYS ER 20 MEQ PO TBCR
40.0000 meq | EXTENDED_RELEASE_TABLET | Freq: Once | ORAL | Status: AC
  Administered 2023-04-17: 40 meq via ORAL
  Filled 2023-04-17: qty 2

## 2023-04-17 MED ORDER — WARFARIN SODIUM 2.5 MG PO TABS
2.5000 mg | ORAL_TABLET | Freq: Once | ORAL | Status: AC
  Administered 2023-04-17: 2.5 mg via ORAL
  Filled 2023-04-17: qty 1

## 2023-04-17 NOTE — Progress Notes (Signed)
ANTICOAGULATION CONSULT NOTE - Follow Up  Pharmacy Consult for heparin > warfarin  Indication: atrial fibrillation and mechanical mitral valve   No Known Allergies  Patient Measurements: Height: 6\' 1"  (185.4 cm) Weight: 86.7 kg (191 lb 2.2 oz) IBW/kg (Calculated) : 79.9 Heparin Dosing Weight: 94.3 kg   Vital Signs: Temp: 98.1 F (36.7 C) (09/22 0757) Temp Source: Oral (09/22 0757) BP: 108/65 (09/22 0700) Pulse Rate: 85 (09/22 0700)  Labs: Recent Labs    04/15/23 0330 04/15/23 0335 04/15/23 0335 04/15/23 1424 04/16/23 0420 04/17/23 0305  HGB  --  9.3*   < > 9.9* 9.4* 9.3*  HCT  --  30.3*   < > 31.9* 29.7* 29.7*  PLT  --  89*   < > 95* 90* 93*  LABPROT 19.2*  --   --   --  23.3* 28.2*  INR 1.6*  --   --   --  2.0* 2.6*  HEPARINUNFRC 0.29*  --   --   --  0.26* 0.35  CREATININE  --  3.50*  --   --  3.77* 3.62*   < > = values in this interval not displayed.    Estimated Creatinine Clearance: 18.4 mL/min (A) (by C-G formula based on SCr of 3.62 mg/dL (H)).   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   Conductive hearing loss    uses hearing aids   Dysrhythmia    A-fib   History of blood transfusion 03/24/2023   Hypothyroidism    Intraspinal abscess    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   Nonischemic cardiomyopathy (HCC)    Psychosexual dysfunction with inhibited sexual excitement    S/P mitral valve replacement    Syncope and collapse    Unspecified sleep apnea    last sleep study 11/07, uses cpap   Ventricular tachycardia (HCC)    VT (ventricular tachycardia) (HCC)     Medications:  Scheduled:   amiodarone  400 mg  Oral BID   Chlorhexidine Gluconate Cloth  6 each Topical Daily   ciprofloxacin  500 mg Oral Q breakfast   levothyroxine  88 mcg Oral QAC breakfast   melatonin  5 mg Oral QHS   multivitamin with minerals  1 tablet Oral q AM   mupirocin ointment  1 Application Topical Daily   quiniDINE gluconate  324 mg Oral BID   sodium chloride flush  10-40 mL Intracatheter Q12H   sodium chloride flush  3 mL Intravenous Q12H   Warfarin - Pharmacist Dosing Inpatient   Does not apply q1600    Assessment: 82 yom presenting after ICD shocks - on enoxaparin and warfarin PTA (LD 9/10 PM). PTA regimen 2.5 mg daily except 5 mg MWF. Currently undergoing bridge given recent admission for Barostim implant complicated with L chest wall hematoma requiring I&D > barostim removed 9/17 and vac removed.  INR today is 2.6, below patient-specific goal but within adequate goal range for [facility] - stop heparin.  PTA warfarin 2.5mg  TTSS / 5mg  MMWF  Goal of Therapy:  INR 3-3.5  - when warfarin not on hold Heparin level: 0.3-0.5 Monitor platelets by anticoagulation protocol: Yes   Plan:  Stop heparin Warfarin 2.5mg  x1  Daily INR   Fredonia Highland, PharmD, BCPS, [facility] Clinical Pharmacist [phone] Please check AMION for all [facility] Pharmacy numbers 04/17/2023

## 2023-04-17 NOTE — Progress Notes (Signed)
Patient ID: Austin [name], male   DOB: 1941-05-26, 82 y.o.   MRN: [id number]     Advanced Heart Failure Rounding Note  PCP-Cardiologist: None   Subjective:    Patient was paced out of slow VT on 9/13.  Since then, has been in and out of slow VT.  Was out of VT on 9/14 but went back in slow VT 9/15 Back in slow VT for a couple of hours rate around 100.  When out of VT, he has had atrial tachycardia with intermittent RV pacing. EP saw and programmed ( RV lead only) to 100 . Switched to lasix drip and given metolazone. 9/16 Back in VT  9/17 VT Paced out. S/P Barostimulator removal.  9/19 Transitioned to PO amiodarone. Lasix gtt d/c 9/20 wound cx + pseudomonas. Cipro started. 9/20 paced out of VT   Off inotropes due to VT. Co-ox 69%.  He had Lasix 160 mg IV bid yesterday, increased UOP (net negative -1400 cc) with Scr 3.5 -> 3.8 -> 3.62. CVP 11-12 today.   A-V Paced. No further VT   Still feels very weak.  Did walk yesterday.    Objective:   Weight Range: 86.7 kg Body mass index is 25.22 kg/m.   Vital Signs:   Temp:  [97.8 F (36.6 C)-98.1 F (36.7 C)] 98.1 F (36.7 C) (09/22 0757) Pulse Rate:  [74-87] 85 (09/22 0700) Resp:  [11-32] 19 (09/22 0700) BP: (95-120)/(64-80) 108/65 (09/22 0700) SpO2:  [85 %-99 %] 91 % (09/22 0700) Weight:  [86.7 kg] 86.7 kg (09/22 0500) Last BM Date : 04/15/23  Weight change: Filed Weights   04/15/23 0600 04/16/23 0500 04/17/23 0500  Weight: 91 kg 92.1 kg 86.7 kg    Intake/Output:   Intake/Output Summary (Last 24 hours) at 04/17/2023 0851 Last data filed at 04/17/2023 0700 Gross per 24 hour  Intake 771.21 ml  Output 2285 ml  Net -1513.79 ml      Physical Exam   General: NAD Neck: JVP 12 cm, no thyromegaly or thyroid nodule.  Lungs: Clear to auscultation bilaterally with normal respiratory effort. CV: Nondisplaced PMI.  Heart regular S1/S2, no S3/S4, no murmur.  2+ edema to knees.  Abdomen: Soft, nontender, no  hepatosplenomegaly, no distention.  Skin: Intact without lesions or rashes.  Neurologic: Alert and oriented x 3.  Psych: Normal affect. Extremities: No clubbing or cyanosis.  HEENT: Normal.   Telemetry   A-V paced 85. No further VT (personally reviewed)  EKG    No new EKG to review   Labs    CBC Recent Labs    04/16/23 0420 04/17/23 0305  WBC 7.9 7.5  HGB 9.4* 9.3*  HCT 29.7* 29.7*  MCV 96.4 95.8  PLT 90* 93*   Basic Metabolic Panel Recent Labs    40/98/11 0420 04/17/23 0305  NA 129* 129*  K 3.7 3.5  CL 90* 88*  CO2 26 27  GLUCOSE 90 96  BUN 57* 51*  CREATININE 3.77* 3.62*  CALCIUM 8.2* 8.4*   Liver Function Tests No results for input(s): "AST", "ALT", "ALKPHOS", "BILITOT", "PROT", "ALBUMIN" in the last 72 hours.   No results for input(s): "LIPASE", "AMYLASE" in the last 72 hours. Cardiac Enzymes No results for input(s): "CKTOTAL", "CKMB", "CKMBINDEX", "TROPONINI" in the last 72 hours.  BNP: BNP (last 3 results) Recent Labs    02/03/23 1502 03/23/23 1045 04/06/23 1026  BNP 453.3* 352.5* 1,258.7*    ProBNP (last 3 results) Recent Labs    12/16/22 0959  PROBNP 1,610*     D-Dimer No results for input(s): "DDIMER" in the last 72 hours. Hemoglobin A1C No results for input(s): "HGBA1C" in the last 72 hours. Fasting Lipid Panel No results for input(s): "CHOL", "HDL", "LDLCALC", "TRIG", "CHOLHDL", "LDLDIRECT" in the last 72 hours. Thyroid Function Tests No results for input(s): "TSH", "T4TOTAL", "T3FREE", "THYROIDAB" in the last 72 hours.  Invalid input(s): "FREET3"   Other results:   Imaging    No results found.   Medications:     Scheduled Medications:  amiodarone  400 mg Oral BID   Chlorhexidine Gluconate Cloth  6 each Topical Daily   ciprofloxacin  500 mg Oral Q breakfast   levothyroxine  88 mcg Oral QAC breakfast   melatonin  5 mg Oral QHS   multivitamin with minerals  1 tablet Oral q AM   mupirocin ointment  1  Application Topical Daily   quiniDINE gluconate  324 mg Oral BID   sodium chloride flush  10-40 mL Intracatheter Q12H   sodium chloride flush  3 mL Intravenous Q12H   Warfarin - Pharmacist Dosing Inpatient   Does not apply q1600    Infusions:  sodium chloride     furosemide Stopped (04/16/23 1911)    PRN Medications: sodium chloride, acetaminophen, HYDROcodone-acetaminophen, nitroGLYCERIN, ondansetron (ZOFRAN) IV, mouth rinse, polyethylene glycol, sodium chloride flush, sodium chloride flush    Patient Profile   82 y/o male w/ 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    Now readmitted for ICD shocks for VT and a/c CHF.  Assessment/Plan   1. VT: History of VT and VT ablation in 2017.  MDT ICD.  Redo VT ablation in 1/23 (Dr. Marquette Saa).  He is on amiodarone, this was increased to 200 mg daily as outpatient with recurrent VT.  He has tolerated amiodarone poorly historically but is doing ok with it today.  In 7/23, slow VT was pace-terminated in the office, then he had an ICD shock for VT later in 7/23.  He was started on quinidine after this.  He is followed by Dr. Ladona Ridgel.  Now with 2 further ICD shocks for VT prior to this admission, had been in slow VT rate 100s-110s this admission.  Paced out of VT 9/13 by EP, since then has been in and out of slow VT.  At baseline, he RV paces most of the time. EP decreased pacing rate to 85 bpm. He has remained out of VT over the last 3 days. Now on PO amio, stable.  - Continue PO amio 400 mg bid   - Continue quinidine.  2. Acute on chronic systolic CHF: Long-standing, suspected nonischemic cardiomyopathy.  Had RHC on 02/26/21 with normal filling pressures and mildly low cardiac output.  Echo in 4/23  showed  EF 20-25% with diffuse hypokinesis and septal-lateral dyssynchrony, mildly decreased RV systolic function, mechanical MV with normal function, dilated IVC.  He has a [employer] CRT-D device present, but the LV lead is not functional.  He has complete heart block, so has been RV paced chronically with a wide QRS.  Has had difficulty tolerating GDMT historically with CKD and hypotension.  Currently off his home GDMT due to low BP and elevated creatinine.  Echo this admission with EF 20%, mechanical MV mean gradient 8 mmHg.  Co-ox 69% today, CVP 11-12.  Creatinine lower today 3.8 => 3.62 with better UOP on Lasix 160 mg IV bid. Have not used inotropes due to recurrent VT.  - Will give Lasix 160 mg IV bid today, hopefully to po diuretic tomorrow if diureses reasonably again today. BMET this afternoon.  - I worry that he has end-stage biventricular HF and progressive renal failure. Likely not candidate for HD given age and end-stage HF. May be headed toward Palliative Care pathway.  With improvement in creatinine today, may be able to get him back to po diuretics and out to rehab.  - Off Spironolactone, valsartan, and Toprol XL  with low SBP and AKI.  - Off digoxin due to persistently elevated level.  - He did not tolerate SGLT2 inhibitor in the past.  - The patient has CHB and has been chronically RV pacing as his LV lead is nonfunctional.  This is not ideal and creates dyssynchrony with a very wide QRS.  Left bundle lead placement discussed in past with Dr. Ladona Ridgel but thought risk would be too high and left bundle lead was not attempted.   - Barostimulator device placed but complicated by chest wall hematoma as below and has been removed.  3. Atrial fibrillation/flutter, atrial tachycardia: Paroxysmal.  Earlier this admission, when out of VT, he has been in atrial tachycardia. However, this morning, he is out of AT and is v-pacing.  - Continue amiodarone - Continue warfarin.  4. Complete heart block:  Patient is pacer dependent due to CHB.  - See above, upgrade to functional CRT thought to be too risky due to need to extract old CS lead.  5. OSA: Severe. Continue CPAP 6. Mechanical mitral valve: Mean gradient higher on echo this admission at 8 mmHg. INR 2.6.  - Continue warfarin INR goal 2.5-3.5.  - Stop heparin gtt today.  7. Chest Wall Hematoma: post barostimulator complication, in setting of coumadin for mechanical MV.  s/p I&D for evacuation of hematoma and placement of wound vac + placement of antibiotic beads on 8/30. Barostimulator device removed 9/17, still has drain in place. Wound cx with Pseudomonas.  - ID has seen, plan for 10 day course of Ciprofloxacin.  - wound vac management per [name]  - vascular following.   8. AKI on CKD stage 3: Creatinine baseline 1.5-2.  2.7=>2.38 =>2.68 => 3=>3.50=> 3.8 => 3.62 today.  - see plan as above - If creatinine starts to rise again can consider discussing options with Renal but likely not candidate for HD given age and end-stage HF. May be headed toward Palliative Care pathway   CRITICAL CARE Performed by: Marca Ancona  Total critical care time: 40 minutes  Critical care time was exclusive of separately billable procedures and treating other patients.  Critical care was necessary to treat or prevent imminent or life-threatening deterioration.  Critical care was time spent personally by me (independent of midlevel providers or residents) on the following activities: development of treatment plan with patient and/or surrogate as well as nursing, discussions with consultants, evaluation of patient's response to treatment, examination of patient, obtaining history from patient or surrogate, ordering and performing treatments and interventions, ordering and review of laboratory studies, ordering and review of radiographic studies, pulse oximetry and re-evaluation of patient's condition.   Length of Stay: 54  Marca Ancona, MD   04/17/2023, 8:51 AM  Advanced Heart Failure Team Pager [phone] (M-F; 7a - 5p)  Please contact [facility] for night-coverage after hours (5p -7a )  and weekends on [email or web address]

## 2023-04-17 NOTE — Progress Notes (Signed)
    Subjective  - POD #4, s/p Barostim removal   No acute events   Physical Exam:  Left chest dressing dry       Assessment/Plan:  POD #3  JP with 10cc output, continue to suction Continue abx for pseudomonas  Wells Locklyn Henriquez 04/17/2023 10:47 AM --  Vitals:   04/17/23 0900 04/17/23 1000  BP: 110/66 120/73  Pulse: 87 84  Resp: 12 15  Temp:    SpO2: 94% (!) 88%    Intake/Output Summary (Last 24 hours) at 04/17/2023 1047 Last data filed at 04/17/2023 0700 Gross per 24 hour  Intake 771.21 ml  Output 2285 ml  Net -1513.79 ml     Laboratory CBC    Component Value Date/Time   WBC 7.5 04/17/2023 0305   HGB 9.3 (L) 04/17/2023 0305   HGB 16.6 12/04/2020 1445   HCT 29.7 (L) 04/17/2023 0305   HCT 50.1 12/04/2020 1445   PLT 93 (L) 04/17/2023 0305   PLT 158 12/04/2020 1445    BMET    Component Value Date/Time   NA 129 (L) 04/17/2023 0305   NA 139 01/29/2021 0853   K 3.5 04/17/2023 0305   CL 88 (L) 04/17/2023 0305   CO2 27 04/17/2023 0305   GLUCOSE 96 04/17/2023 0305   BUN 51 (H) 04/17/2023 0305   BUN 18 01/29/2021 0853   CREATININE 3.62 (H) 04/17/2023 0305   CREATININE 1.22 (H) 09/12/2015 0846   CALCIUM 8.4 (L) 04/17/2023 0305   GFRNONAA 16 (L) 04/17/2023 0305   GFRAA 56 (L) 04/30/2020 1144    COAG Lab Results  Component Value Date   INR 2.6 (H) 04/17/2023   INR 2.0 (H) 04/16/2023   INR 1.6 (H) 04/15/2023   No results found for: "PTT"  Antibiotics Anti-infectives (From admission, onward)    Start     Dose/Rate Route Frequency Ordered Stop   04/16/23 0800  ciprofloxacin (CIPRO) tablet 500 mg        500 mg Oral Daily with breakfast 04/15/23 1528     04/15/23 1500  ceFEPIme (MAXIPIME) 2 g in sodium chloride 0.9 % 100 mL IVPB  Status:  Discontinued        2 g 200 mL/hr over 30 Minutes Intravenous Every 24 hours 04/15/23 1348 04/15/23 1528   04/11/23 1000  amoxicillin-clavulanate (AUGMENTIN) 500-125 MG per tablet 1 tablet  Status:  Discontinued         1 tablet Oral 2 times daily 04/11/23 0749 04/15/23 1348   04/06/23 2200  amoxicillin-clavulanate (AUGMENTIN) 875-125 MG per tablet 1 tablet  Status:  Discontinued        1 tablet Oral Every 12 hours 04/06/23 1400 04/11/23 0749   04/06/23 1330  doxycycline (VIBRA-TABS) tablet 100 mg  Status:  Discontinued        100 mg Oral Every 12 hours 04/06/23 1243 04/15/23 1350        V. Charlena Cross, M.D., [facility] [place] Office: [phone] Pager:  [phone]

## 2023-04-18 DIAGNOSIS — I5043 Acute on chronic combined systolic (congestive) and diastolic (congestive) heart failure: Secondary | ICD-10-CM | POA: Diagnosis not present

## 2023-04-18 LAB — CBC
HCT: 29.6 % — ABNORMAL LOW (ref 39.0–52.0)
Hemoglobin: 9.3 g/dL — ABNORMAL LOW (ref 13.0–17.0)
MCH: 30.2 pg (ref 26.0–34.0)
MCHC: 31.4 g/dL (ref 30.0–36.0)
MCV: 96.1 fL (ref 80.0–100.0)
Platelets: 90 10*3/uL — ABNORMAL LOW (ref 150–400)
RBC: 3.08 MIL/uL — ABNORMAL LOW (ref 4.22–5.81)
RDW: 19.1 % — ABNORMAL HIGH (ref 11.5–15.5)
WBC: 7 10*3/uL (ref 4.0–10.5)
nRBC: 0 % (ref 0.0–0.2)

## 2023-04-18 LAB — COOXEMETRY PANEL
Carboxyhemoglobin: 2.4 % — ABNORMAL HIGH (ref 0.5–1.5)
Methemoglobin: 0.9 % (ref 0.0–1.5)
O2 Saturation: 70.6 %
Total hemoglobin: 9.8 g/dL — ABNORMAL LOW (ref 12.0–16.0)

## 2023-04-18 LAB — MAGNESIUM: Magnesium: 2.4 mg/dL (ref 1.7–2.4)

## 2023-04-18 LAB — PROTIME-INR
INR: 3 — ABNORMAL HIGH (ref 0.8–1.2)
Prothrombin Time: 31.2 s — ABNORMAL HIGH (ref 11.4–15.2)

## 2023-04-18 LAB — BASIC METABOLIC PANEL WITH GFR
Anion gap: 12 (ref 5–15)
BUN: 48 mg/dL — ABNORMAL HIGH (ref 8–23)
CO2: 27 mmol/L (ref 22–32)
Calcium: 8.2 mg/dL — ABNORMAL LOW (ref 8.9–10.3)
Chloride: 91 mmol/L — ABNORMAL LOW (ref 98–111)
Creatinine, Ser: 3.88 mg/dL — ABNORMAL HIGH (ref 0.61–1.24)
GFR, Estimated: 15 mL/min — ABNORMAL LOW
Glucose, Bld: 166 mg/dL — ABNORMAL HIGH (ref 70–99)
Potassium: 3.4 mmol/L — ABNORMAL LOW (ref 3.5–5.1)
Sodium: 130 mmol/L — ABNORMAL LOW (ref 135–145)

## 2023-04-18 MED ORDER — POTASSIUM CHLORIDE CRYS ER 20 MEQ PO TBCR
20.0000 meq | EXTENDED_RELEASE_TABLET | Freq: Once | ORAL | Status: AC
  Administered 2023-04-18: 20 meq via ORAL
  Filled 2023-04-18: qty 1

## 2023-04-18 MED ORDER — WARFARIN SODIUM 2 MG PO TABS
2.0000 mg | ORAL_TABLET | Freq: Every day | ORAL | Status: DC
  Administered 2023-04-18: 2 mg via ORAL
  Filled 2023-04-18: qty 1

## 2023-04-18 NOTE — Progress Notes (Signed)
Orthopedic Tech Progress Note Patient Details:  Austin [name] 1940/10/23 [id number]  Ortho Devices Type of Ortho Device: Roland Rack boot Ortho Device/Splint Location: BLE Ortho Device/Splint Interventions: Ordered, Application   Post Interventions Patient Tolerated: Well Instructions Provided: Care of device  Austin [name] A Tamsin Nader 04/18/2023, 5:08 PM

## 2023-04-18 NOTE — Progress Notes (Signed)
  Progress Note    04/18/2023 7:26 AM 6 Days Post-Op  Subjective:  feeling fine this morning    Vitals:   04/18/23 0600 04/18/23 0615  BP: 100/70   Pulse: 84 84  Resp: (!) 22 (!) 26  Temp:    SpO2: 97% 97%    Physical Exam: General:  sitting up in bed Lungs:  nonlabored Incisions:  L chest incision with minimal serosanguineous drainage, redressed with dry bandage. Incision intact with staples. L sided chest JP with 35cc output in 24H  CBC    Component Value Date/Time   WBC 7.0 04/18/2023 0306   RBC 3.08 (L) 04/18/2023 0306   HGB 9.3 (L) 04/18/2023 0306   HGB 16.6 12/04/2020 1445   HCT 29.6 (L) 04/18/2023 0306   HCT 50.1 12/04/2020 1445   PLT 90 (L) 04/18/2023 0306   PLT 158 12/04/2020 1445   MCV 96.1 04/18/2023 0306   MCV 94 12/04/2020 1445   MCH 30.2 04/18/2023 0306   MCHC 31.4 04/18/2023 0306   RDW 19.1 (H) 04/18/2023 0306   RDW 13.7 12/04/2020 1445   LYMPHSABS 0.3 (L) 04/06/2023 1030   MONOABS 1.1 (H) 04/06/2023 1030   EOSABS 0.1 04/06/2023 1030   BASOSABS 0.1 04/06/2023 1030    BMET    Component Value Date/Time   NA 130 (L) 04/18/2023 0306   NA 139 01/29/2021 0853   K 3.4 (L) 04/18/2023 0306   CL 91 (L) 04/18/2023 0306   CO2 27 04/18/2023 0306   GLUCOSE 166 (H) 04/18/2023 0306   BUN 48 (H) 04/18/2023 0306   BUN 18 01/29/2021 0853   CREATININE 3.88 (H) 04/18/2023 0306   CREATININE 1.22 (H) 09/12/2015 0846   CALCIUM 8.2 (L) 04/18/2023 0306   GFRNONAA 15 (L) 04/18/2023 0306   GFRAA 56 (L) 04/30/2020 1144    INR    Component Value Date/Time   INR 3.0 (H) 04/18/2023 0306     Intake/Output Summary (Last 24 hours) at 04/18/2023 0726 Last data filed at 04/18/2023 0600 Gross per 24 hour  Intake 492 ml  Output 2655 ml  Net -2163 ml      Assessment/Plan:  82 y.o. male is 6 days post op, s/p: barostim removal    -No events overnight. Denies any pain at left chest incision site -L sided chest incision intact with staples, minimal  serosanguineous drainage. Dry dressing replaced -JP drain with 30cc output in 24H, continue to suction -Continue ABX for Pseudomonas. No leukocytosis, WBC 7.0   [name], [place] [facility] [phone] 04/18/2023 7:26 AM

## 2023-04-18 NOTE — Progress Notes (Signed)
ANTICOAGULATION CONSULT NOTE - Follow Up  Pharmacy Consult for heparin > warfarin  Indication: atrial fibrillation and mechanical mitral valve   No Known Allergies  Patient Measurements: Height: 6\' 1"  (185.4 cm) Weight: 90.3 kg (199 lb 1.2 oz) IBW/kg (Calculated) : 79.9 Heparin Dosing Weight: 94.3 kg   Vital Signs: Temp: 98.8 F (37.1 C) (09/22 2300) Temp Source: Oral (09/22 2300) BP: 89/72 (09/23 0900) Pulse Rate: 82 (09/23 0900)  Labs: Recent Labs    04/16/23 0420 04/17/23 0305 04/17/23 1655 04/18/23 0306  HGB 9.4* 9.3*  --  9.3*  HCT 29.7* 29.7*  --  29.6*  PLT 90* 93*  --  90*  LABPROT 23.3* 28.2*  --  31.2*  INR 2.0* 2.6*  --  3.0*  HEPARINUNFRC 0.26* 0.35  --   --   CREATININE 3.77* 3.62* 3.49* 3.88*    Estimated Creatinine Clearance: 17.2 mL/min (A) (by C-G formula based on SCr of 3.88 mg/dL (H)).   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   Conductive hearing loss    uses hearing aids   Dysrhythmia    A-fib   History of blood transfusion 03/24/2023   Hypothyroidism    Intraspinal abscess    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   Nonischemic cardiomyopathy (HCC)    Psychosexual dysfunction with inhibited sexual excitement    S/P mitral valve replacement    Syncope and collapse    Unspecified sleep apnea    last sleep study 11/07, uses cpap   Ventricular tachycardia (HCC)    VT (ventricular tachycardia) (HCC)     Medications:  Scheduled:   amiodarone  400 mg Oral BID   Chlorhexidine Gluconate Cloth  6 each Topical Daily   ciprofloxacin  500 mg Oral Q breakfast   levothyroxine  88 mcg Oral QAC breakfast    melatonin  5 mg Oral QHS   multivitamin with minerals  1 tablet Oral q AM   mupirocin ointment  1 Application Topical Daily   quiniDINE gluconate  324 mg Oral BID   sodium chloride flush  10-40 mL Intracatheter Q12H   sodium chloride flush  3 mL Intravenous Q12H   warfarin  2 mg Oral q1600   Warfarin - Pharmacist Dosing Inpatient   Does not apply q1600    Assessment: 82 yom presenting after ICD shocks - on enoxaparin and warfarin PTA (LD 9/10 PM). PTA regimen 2.5 mg TTSS and  5 mg MWF. S/P undergoing bridge anticoagulation given recent admission for Barostim implant complicated with L chest wall hematoma requiring I&D > barostim removed 9/17 and vac removed.  INR today is 3 at goal range for [facility], heparin drip stopped 9/22 No bleeding noted, h/h stable, pltc low stable  Also on cipro for pseudomonas wound infection - can increase INR PTA warfarin 2.5mg  TTSS / 5mg  MMWF  Goal of Therapy:  INR 3-3.5  - when warfarin not on hold Heparin level: 0.3-0.5 Monitor platelets by anticoagulation protocol: Yes   Plan:  Warfarin 2mg  daily  Daily INR    Leota Sauers Pharm.D. CPP, BCPS Clinical Pharmacist [phone] 04/18/2023 10:37 AM   Please check AMION for all [facility] Pharmacy  numbers 04/18/2023

## 2023-04-18 NOTE — Plan of Care (Signed)
Problem: Elimination: Goal: Will not experience complications related to bowel motility Outcome: Progressing   Problem: Pain Managment: Goal: General experience of comfort will improve Outcome: Progressing

## 2023-04-18 NOTE — Progress Notes (Signed)
Physical Therapy Treatment Patient Details Name: Austin [name] MRN: [id number] DOB: [birth year] Today's Date: 04/18/2023   History of Present Illness Pt is an 82 y.o. male who presented 04/06/23 with ICD shocks. Of note, pt underwent Barostim device 03/17/23. S/p removal of L sided Barostim device and redo L carotid artery exposure 9/17. PMH: Mechanical MV s/p MV endocarditis, VT, NICM, CHB and PAF/AFL, CKD, CHF, syncope, sleep apnea    PT Comments  Pt with c/o "I just don't feel good. I don't know if I'm sleepy or about to faint." BP soft 90s/50s t/o session. Pt requiring seated rest break and frequent standing rest breaks today with ambulation. Pt with noted increased instability this date compared to previous sessions. Acute PT to cont to follow. Pt to benefit from inpatient rehab <3hrs a day to address above deficits.    If plan is discharge home, recommend the following: A little help with walking and/or transfers;A little help with bathing/dressing/bathroom;Assistance with cooking/housework;Assist for transportation   Can travel by private vehicle     Yes  Equipment Recommendations  Other (comment) (defer to next venue of care)    Recommendations for Other Services       Precautions / Restrictions Precautions Precautions: Fall;ICD/Pacemaker Precaution Comments: JP drain L chest; watch BP Restrictions Weight Bearing Restrictions: No     Mobility  Bed Mobility Overal bed mobility: Needs Assistance Bed Mobility: Supine to Sit     Supine to sit: Min assist     General bed mobility comments: minA for trunk elevation, able to bring LEs off EOB, able to use bilat UEs to scoot forward to edge of bed    Transfers Overall transfer level: Needs assistance Equipment used: Rolling walker (2 wheels) Transfers: Sit to/from Stand Sit to Stand: Min assist           General transfer comment: minA to power up and steady, verbal cues to push up from bed/chair however still  pulls up on RW    Ambulation/Gait Ambulation/Gait assistance: Min assist, Mod assist, +2 safety/equipment Gait Distance (Feet): 80 Feet (x1, 150x1) Assistive device: Rolling walker (2 wheels) Gait Pattern/deviations: Step-through pattern, Decreased step length - right, Decreased step length - left, Decreased stride length, Trunk flexed, Drifts right/left, Wide base of support Gait velocity: reduced Gait velocity interpretation: <1.8 ft/sec, indicate of risk for recurrent falls   General Gait Details: mild unsteadiness, c/o pt stating "I can't tell if i'm going to pass out or i'm just sleepy" pt required one seated rest break. Pt requiring frequent standing rest breaks   Stairs             Wheelchair Mobility     Tilt Bed    Modified Rankin (Stroke Patients Only)       Balance Overall balance assessment: Needs assistance Sitting-balance support: No upper extremity supported, Feet supported Sitting balance-Leahy Scale: Good Sitting balance - Comments: EOB   Standing balance support: Reliant on assistive device for balance Standing balance-Leahy Scale: Poor Standing balance comment: Reliant on RW and external support                            Cognition Arousal: Alert Behavior During Therapy: WFL for tasks assessed/performed Overall Cognitive Status: Within Functional Limits for tasks assessed  General Comments: delayed response time due to extremely Hard of hearing        Exercises General Exercises - Lower Extremity Ankle Circles/Pumps: AROM, Both, 10 reps Long Arc Quad: AROM, Both, 10 reps, Seated    General Comments General comments (skin integrity, edema, etc.): BP soft 90s/60s      Pertinent Vitals/Pain Pain Assessment Pain Assessment: No/denies pain    Home Living                          Prior Function            PT Goals (current goals can now be found in the care plan  section) Acute Rehab PT Goals Patient Stated Goal: to improve PT Goal Formulation: With patient/family Time For Goal Achievement: 04/27/23 Potential to Achieve Goals: Good Progress towards PT goals: Not progressing toward goals - comment (c/o "I just don't feel good")    Frequency    Min 1X/week      PT Plan      Co-evaluation              AM-PAC PT "6 Clicks" Mobility   Outcome Measure  Help needed turning from your back to your side while in a flat bed without using bedrails?: A Little Help needed moving from lying on your back to sitting on the side of a flat bed without using bedrails?: A Little Help needed moving to and from a bed to a chair (including a wheelchair)?: A Little Help needed standing up from a chair using your arms (e.g., wheelchair or bedside chair)?: A Little Help needed to walk in hospital room?: A Little Help needed climbing 3-5 steps with a railing? : A Lot 6 Click Score: 17    End of Session Equipment Utilized During Treatment: Gait belt Activity Tolerance: Patient tolerated treatment well Patient left: in chair;with call bell/phone within reach;with family/visitor present Nurse Communication: Mobility status PT Visit Diagnosis: Other abnormalities of gait and mobility (R26.89);Unsteadiness on feet (R26.81);Muscle weakness (generalized) (M62.81);Difficulty in walking, not elsewhere classified (R26.2);Pain Pain - Right/Left: Left Pain - part of body:  (chest)     Time: 1914-7829 PT Time Calculation (min) (ACUTE ONLY): 28 min  Charges:    $Gait Training: 8-22 mins $Therapeutic Activity: 8-22 mins PT General Charges $$ ACUTE PT VISIT: 1 Visit                     Lewis Shock, PT, DPT Acute Rehabilitation Services Secure chat preferred Office #: [phone]    Iona Hansen 04/18/2023, 9:15 AM

## 2023-04-18 NOTE — Progress Notes (Addendum)
Patient ID: Austin [name], male   DOB: 12/31/1940, 82 y.o.   MRN: [id number]     Advanced Heart Failure Rounding Note  PCP-Cardiologist: None   Subjective:   Patient was paced out of slow VT on 9/13.  Since then, has been in and out of slow VT.  Was out of VT on 9/14 but went back in slow VT 9/15 Back in slow VT for a couple of hours rate around 100.  When out of VT, he has had atrial tachycardia with intermittent RV pacing. EP saw and programmed ( RV lead only) to 100 . Switched to lasix drip and given metolazone. 9/16 Back in VT  9/17 VT Paced out. S/P Barostimulator removal.  9/19 Transitioned to PO amiodarone. Lasix gtt d/c 9/20 wound cx + pseudomonas. Cipro started. 9/20 paced out of VT   Off inotropes due to VT. Co-ox 71%.  He had Lasix 160 mg IV bid yesterday, increased UOP (net negative -2.1L) with Scr 3.5 -> 3.8 -> 3.62>3.49>3.88. weight up 8lbs? (Discussed with RN plan to get standing weight at next transfer). CVP 10/11 today.   A-V Paced. No further VT   Feels a little "out of it" this morning. Got dizzy this morning with ambulation and had to take several breaks.   Objective:   Weight Range: 90.3 kg Body mass index is 26.26 kg/m.   Vital Signs:   Temp:  [98 F (36.7 C)-98.8 F (37.1 C)] 98.8 F (37.1 C) (09/22 2300) Pulse Rate:  [82-88] 82 (09/23 0900) Resp:  [9-38] 22 (09/23 0900) BP: (89-120)/(57-80) 89/72 (09/23 0900) SpO2:  [71 %-99 %] 93 % (09/23 0900) Weight:  [90.3 kg] 90.3 kg (09/23 0500) Last BM Date : 04/15/23  Weight change: Filed Weights   04/16/23 0500 04/17/23 0500 04/18/23 0500  Weight: 92.1 kg 86.7 kg 90.3 kg    Intake/Output:   Intake/Output Summary (Last 24 hours) at 04/18/2023 3474 Last data filed at 04/18/2023 0800 Gross per 24 hour  Intake 492 ml  Output 2580 ml  Net -2088 ml     CVP 10-11 in chair Physical Exam  General:  elderly appearing.  No respiratory difficulty HEENT: HOH, normal Neck: supple. JVD ~9 cm. Carotids  2+ bilat; no bruits. No lymphadenopathy or thyromegaly appreciated. Cor: PMI nondisplaced. Regular rate & rhythm. No rubs, gallops or murmurs. Lungs: clear Abdomen: soft, nontender, nondistended. No hepatosplenomegaly. No bruits or masses. Good bowel sounds. Extremities: no cyanosis, clubbing, rash, +1 BLE edema to thigh Neuro: alert & oriented x 3, cranial nerves grossly intact. moves all 4 extremities w/o difficulty. Affect pleasant.   Telemetry   A-V paced 80s No further VT (personally reviewed)  EKG    No new EKG to review   Labs    CBC Recent Labs    04/17/23 0305 04/18/23 0306  WBC 7.5 7.0  HGB 9.3* 9.3*  HCT 29.7* 29.6*  MCV 95.8 96.1  PLT 93* 90*   Basic Metabolic Panel Recent Labs    25/95/63 1655 04/18/23 0306  NA 129* 130*  K 3.8 3.4*  CL 89* 91*  CO2 28 27  GLUCOSE 131* 166*  BUN 51* 48*  CREATININE 3.49* 3.88*  CALCIUM 8.4* 8.2*  MG  --  2.4   Liver Function Tests No results for input(s): "AST", "ALT", "ALKPHOS", "BILITOT", "PROT", "ALBUMIN" in the last 72 hours.   No results for input(s): "LIPASE", "AMYLASE" in the last 72 hours. Cardiac Enzymes No results for input(s): "CKTOTAL", "CKMB", "CKMBINDEX", "TROPONINI"  in the last 72 hours.  BNP: BNP (last 3 results) Recent Labs    02/03/23 1502 03/23/23 1045 04/06/23 1026  BNP 453.3* 352.5* 1,258.7*    ProBNP (last 3 results) Recent Labs    12/16/22 0959  PROBNP 3,692*   D-Dimer No results for input(s): "DDIMER" in the last 72 hours. Hemoglobin A1C No results for input(s): "HGBA1C" in the last 72 hours. Fasting Lipid Panel No results for input(s): "CHOL", "HDL", "LDLCALC", "TRIG", "CHOLHDL", "LDLDIRECT" in the last 72 hours. Thyroid Function Tests No results for input(s): "TSH", "T4TOTAL", "T3FREE", "THYROIDAB" in the last 72 hours.  Invalid input(s): "FREET3"  Other results:  Imaging   No results found.  Medications:   Scheduled Medications:  amiodarone  400 mg Oral BID    Chlorhexidine Gluconate Cloth  6 each Topical Daily   ciprofloxacin  500 mg Oral Q breakfast   levothyroxine  88 mcg Oral QAC breakfast   melatonin  5 mg Oral QHS   multivitamin with minerals  1 tablet Oral q AM   mupirocin ointment  1 Application Topical Daily   quiniDINE gluconate  324 mg Oral BID   sodium chloride flush  10-40 mL Intracatheter Q12H   sodium chloride flush  3 mL Intravenous Q12H   Warfarin - Pharmacist Dosing Inpatient   Does not apply q1600    Infusions:  sodium chloride      PRN Medications: sodium chloride, acetaminophen, HYDROcodone-acetaminophen, nitroGLYCERIN, ondansetron (ZOFRAN) IV, mouth rinse, polyethylene glycol, sodium chloride flush, sodium chloride flush  Patient Profile  82 y/o male w/ 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    Now readmitted for ICD shocks for VT and a/c CHF.  Assessment/Plan  1. VT: History of VT and VT ablation in 2017.  MDT ICD.  Redo VT ablation in 1/23 (Dr. Marquette Saa).  He is on amiodarone, this was increased to 200 mg daily as outpatient with recurrent VT.  He has tolerated amiodarone poorly historically but is doing ok with it today.  In 7/23, slow VT was pace-terminated in the office, then he had an ICD shock for VT later in 7/23.  He was started on quinidine after this.  He is followed by Dr. Ladona Ridgel.  Now with 2 further ICD shocks for VT prior to this admission, had been in slow VT rate 100s-110s this admission.  Paced out of VT 9/13 by EP, since then has been in and out of slow VT.  At baseline, he RV paces most of the time. EP decreased pacing rate to 85 bpm. He has remained out of VT over the last 3 days. Now on PO amio, stable.  - Continue PO amio 400 mg bid   -  Continue quinidine.  2. Acute on chronic systolic CHF: Long-standing, suspected nonischemic cardiomyopathy.  Had RHC on 02/26/21 with normal filling pressures and mildly low cardiac output.  Echo in 4/23 showed  EF 20-25% with diffuse hypokinesis and septal-lateral dyssynchrony, mildly decreased RV systolic function, mechanical MV with normal function, dilated IVC.  He has a [employer] CRT-D device present, but the LV lead is not functional.  He has complete heart block, so has been RV paced chronically with a wide QRS.  Has had difficulty tolerating GDMT historically with CKD and hypotension.  Currently  off his home GDMT due to low BP and elevated creatinine.  Echo this admission with EF 20%, mechanical MV mean gradient 8 mmHg.  Co-ox 69% today, CVP 10-11.  Creatinine higher today 3.8 => 3.62>3.49>3.88, UOP good yesterday on Lasix 160 mg IV bid. Have not used inotropes due to recurrent VT.  - Hold diuretics today with bump in SCr. Volume status much improved.  - I worry that he has end-stage biventricular HF and progressive renal failure. Likely not candidate for HD given age and end-stage HF. May be headed toward Palliative Care pathway.  SCr worst today.  - Off Spironolactone, valsartan, and Toprol XL with low SBP and AKI.  - Off digoxin due to persistently elevated level.  - He did not tolerate SGLT2 inhibitor in the past.  - The patient has CHB and has been chronically RV pacing as his LV lead is nonfunctional.  This is not ideal and creates dyssynchrony with a very wide QRS.  Left bundle lead placement discussed in past with Dr. Ladona Ridgel but thought risk would be too high and left bundle lead was not attempted.   - Barostimulator device placed but complicated by chest wall hematoma as below and has been removed.  3. Atrial fibrillation/flutter, atrial tachycardia: Paroxysmal.  Earlier this admission, when out of VT, he has been in atrial tachycardia. However, this morning, he is out of AT and is v-pacing.   - Continue amiodarone - Continue warfarin.  4. Complete heart block: Patient is pacer dependent due to CHB.  - See above, upgrade to functional CRT thought to be too risky due to need to extract old CS lead.  5. OSA: Severe. Continue CPAP 6. Mechanical mitral valve: Mean gradient higher on echo this admission at 8 mmHg. INR 2.6.  - Continue warfarin INR goal 2.5-3.5.  - Stop heparin gtt today.  7. Chest Wall Hematoma: post barostimulator complication, in setting of coumadin for mechanical MV.  s/p I&D for evacuation of hematoma and placement of wound vac + placement of antibiotic beads on 8/30. Barostimulator device removed 9/17, still has drain in place. Wound cx with Pseudomonas.  - ID has seen, plan for 10 day course of Ciprofloxacin.  - wound vac management per [name]  - vascular following.   8. AKI on CKD stage 3: Creatinine baseline 1.5-2.  2.7=>2.38 =>2.68 => 3=>3.50=> 3.8 => 3.62>3.49>3.88 today.  - see plan as above - hold diuretics today - If creatinine starts to rise again can consider discussing options with Renal but likely not candidate for HD given age and end-stage HF. May be headed toward Palliative Care pathway   Length of Stay: [address], [place]  04/18/2023, 9:38 AM  Advanced Heart Failure Team Pager [phone] (M-F; 7a - 5p)  Please contact [facility] for night-coverage after hours (5p -7a ) and weekends on [email or web address]  Patient seen and examined with the above-signed Advanced Practice Provider and/or Housestaff. I personally reviewed laboratory data, imaging studies and relevant notes. I independently examined the patient and formulated the important aspects of the plan. I have edited the note to reflect any of my changes or salient points. I have personally discussed the plan with the patient and/or family.  Diuresed well over the weekend. Co-ox stable off milrinone but SCr up. Feels lightheaded. No CP or SOB   General:  Sitting in chair  No resp  difficulty HEENT: normal Neck: supple. JVP 10 Carotids 2+ bilat; no bruits. No lymphadenopathy or thryomegaly appreciated. Cor:  Regular rate & rhythm. 2/6 TR Lungs: clear Abdomen: soft, nontender, nondistended. No hepatosplenomegaly. No bruits or masses. Good bowel sounds. Extremities: no cyanosis, clubbing, rash, tr edema Neuro: alert & orientedx3, cranial nerves grossly intact. moves all 4 extremities w/o difficulty. Affect pleasant  Remains very tenuous with advanced RV failure and cardiorenal syndrome. Co-ox now stable off milrinone. Hold diuretics today. Resume po tomorrow. Hopefully renal function will stabilize, o/w will need further Palliative Care discussions.   Arvilla Meres, MD  2:59 PM

## 2023-04-18 NOTE — Progress Notes (Signed)
No ICM remote transmission received for 04/18/2023 due to hospitalization and next ICM transmission scheduled for 05/09/2023.

## 2023-04-19 DIAGNOSIS — I5023 Acute on chronic systolic (congestive) heart failure: Secondary | ICD-10-CM | POA: Diagnosis not present

## 2023-04-19 LAB — BASIC METABOLIC PANEL
Anion gap: 11 (ref 5–15)
BUN: 46 mg/dL — ABNORMAL HIGH (ref 8–23)
CO2: 29 mmol/L (ref 22–32)
Calcium: 8.9 mg/dL (ref 8.9–10.3)
Chloride: 90 mmol/L — ABNORMAL LOW (ref 98–111)
Creatinine, Ser: 3.46 mg/dL — ABNORMAL HIGH (ref 0.61–1.24)
GFR, Estimated: 17 mL/min — ABNORMAL LOW (ref >60)
Glucose, Bld: 101 mg/dL — ABNORMAL HIGH (ref 70–99)
Potassium: 3.6 mmol/L (ref 3.5–5.1)
Sodium: 130 mmol/L — ABNORMAL LOW (ref 135–145)

## 2023-04-19 LAB — COOXEMETRY PANEL
Carboxyhemoglobin: 3 % — ABNORMAL HIGH (ref 0.5–1.5)
Carboxyhemoglobin: 1.9 % — ABNORMAL HIGH (ref 0.5–1.5)
Methemoglobin: 0.8 % (ref 0.0–1.5)
Methemoglobin: <0.7 % (ref 0.0–1.5)
O2 Saturation: 80.2 %
O2 Saturation: 63.4 %
Total hemoglobin: 10.1 g/dL — ABNORMAL LOW (ref 12.0–16.0)
Total hemoglobin: 10.1 g/dL — ABNORMAL LOW (ref 12.0–16.0)

## 2023-04-19 LAB — PROTIME-INR
INR: 2.6 — ABNORMAL HIGH (ref 0.8–1.2)
Prothrombin Time: 28 seconds — ABNORMAL HIGH (ref 11.4–15.2)

## 2023-04-19 LAB — GLUCOSE, CAPILLARY: Glucose-Capillary: 107 mg/dL — ABNORMAL HIGH (ref 70–99)

## 2023-04-19 MED ORDER — LACTULOSE 10 GM/15ML PO SOLN
30.0000 g | Freq: Three times a day (TID) | ORAL | Status: DC
  Administered 2023-04-19: 30 g via ORAL
  Filled 2023-04-19: qty 45

## 2023-04-19 MED ORDER — WARFARIN SODIUM 5 MG PO TABS
5.0000 mg | ORAL_TABLET | Freq: Once | ORAL | Status: AC
  Administered 2023-04-19: 5 mg via ORAL
  Filled 2023-04-19: qty 1

## 2023-04-19 MED ORDER — TORSEMIDE 20 MG PO TABS
20.0000 mg | ORAL_TABLET | Freq: Two times a day (BID) | ORAL | Status: DC
  Administered 2023-04-19 – 2023-04-20 (×4): 20 mg via ORAL
  Filled 2023-04-19 (×4): qty 1

## 2023-04-19 MED ORDER — POLYETHYLENE GLYCOL 3350 17 G PO PACK
17.0000 g | PACK | Freq: Two times a day (BID) | ORAL | Status: DC
  Administered 2023-04-19 – 2023-04-22 (×6): 17 g via ORAL
  Filled 2023-04-19 (×6): qty 1

## 2023-04-19 MED ORDER — POTASSIUM CHLORIDE CRYS ER 20 MEQ PO TBCR
20.0000 meq | EXTENDED_RELEASE_TABLET | Freq: Once | ORAL | Status: AC
  Administered 2023-04-19: 20 meq via ORAL
  Filled 2023-04-19: qty 1

## 2023-04-19 MED ORDER — SORBITOL 70 % SOLN
30.0000 mL | Freq: Once | Status: AC
  Administered 2023-04-19: 30 mL via ORAL
  Filled 2023-04-19: qty 30

## 2023-04-19 MED ORDER — WARFARIN SODIUM 2.5 MG PO TABS
2.5000 mg | ORAL_TABLET | Freq: Every day | ORAL | Status: DC
  Administered 2023-04-20 – 2023-04-22 (×3): 2.5 mg via ORAL
  Filled 2023-04-19 (×3): qty 1

## 2023-04-19 NOTE — Progress Notes (Signed)
  Progress Note    04/19/2023 8:12 AM 7 Days Post-Op  Subjective:   no complaints    Vitals:   04/19/23 0700 04/19/23 0807  BP: 98/75   Pulse:    Resp: 20   Temp:  97.8 F (36.6 C)  SpO2:      Physical Exam: General:  sitting in the chair Lungs: nonlabored Incisions:  L sided chest incision dry with bandage. L sided chest JP with 20cc output in 24H. L sided neck incision dry and intact Extremities: bilateral upper extremities dressed   CBC    Component Value Date/Time   WBC 7.0 04/18/2023 0306   RBC 3.08 (L) 04/18/2023 0306   HGB 9.3 (L) 04/18/2023 0306   HGB 16.6 12/04/2020 1445   HCT 29.6 (L) 04/18/2023 0306   HCT 50.1 12/04/2020 1445   PLT 90 (L) 04/18/2023 0306   PLT 158 12/04/2020 1445   MCV 96.1 04/18/2023 0306   MCV 94 12/04/2020 1445   MCH 30.2 04/18/2023 0306   MCHC 31.4 04/18/2023 0306   RDW 19.1 (H) 04/18/2023 0306   RDW 13.7 12/04/2020 1445   LYMPHSABS 0.3 (L) 04/06/2023 1030   MONOABS 1.1 (H) 04/06/2023 1030   EOSABS 0.1 04/06/2023 1030   BASOSABS 0.1 04/06/2023 1030    BMET    Component Value Date/Time   NA 130 (L) 04/19/2023 0330   NA 139 01/29/2021 0853   K 3.6 04/19/2023 0330   CL 90 (L) 04/19/2023 0330   CO2 29 04/19/2023 0330   GLUCOSE 101 (H) 04/19/2023 0330   BUN 46 (H) 04/19/2023 0330   BUN 18 01/29/2021 0853   CREATININE 3.46 (H) 04/19/2023 0330   CREATININE 1.22 (H) 09/12/2015 0846   CALCIUM 8.9 04/19/2023 0330   GFRNONAA 17 (L) 04/19/2023 0330   GFRAA 56 (L) 04/30/2020 1144    INR    Component Value Date/Time   INR 2.6 (H) 04/19/2023 0330     Intake/Output Summary (Last 24 hours) at 04/19/2023 1610 Last data filed at 04/19/2023 0700 Gross per 24 hour  Intake 303 ml  Output 1495 ml  Net -1192 ml      Assessment/Plan:  82 y.o. male is s/p: barostim removal  -No issues this morning. Reports he didn't sleep well last night -No changes to left chest or left sided neck incisions. These are healing appropriately.   -Left sided chest JP with 20cc serosanguineous output in 24H. Continue to suction -Continue cipro   [name] PA-C [facility] [phone] 04/19/2023 8:12 AM

## 2023-04-19 NOTE — Progress Notes (Signed)
Occupational Therapy Treatment Patient Details Name: Austin [name] MRN: [id number] DOB: 03-10-1941 Today's Date: 04/19/2023   History of present illness Pt is an 82 y.o. male who presented 04/06/23 with ICD shocks. Of note, pt underwent Barostim device 03/17/23. S/p removal of L sided Barostim device and redo L carotid artery exposure 9/17. PMH: Mechanical MV s/p MV endocarditis, VT, NICM, CHB and PAF/AFL, CKD, CHF, syncope, sleep apnea   OT comments  Patient continues to progress toward patient focused goals.  Patient has been able to improve his mobility, which is increasing his independence with ADL completion.  Patient presents with poor dynamic balance, poor activity tolerance and lower body weakness.  Overall he is needing up to Min A with mobility over short distances and lower body ADL with frequent breaks.  OT will continue efforts in the acute setting to address deficits, and Patient will benefit from continued inpatient follow up therapy, <3 hours/day.        If plan is discharge home, recommend the following:  A little help with bathing/dressing/bathroom;A little help with walking and/or transfers;Assist for transportation;Assistance with cooking/housework   Equipment Recommendations  None recommended by OT    Recommendations for Other Services      Precautions / Restrictions Precautions Precautions: Fall;ICD/Pacemaker Precaution Comments: JP drain L chest; watch BP Restrictions Weight Bearing Restrictions: No       Mobility Bed Mobility                    Transfers Overall transfer level: Needs assistance Equipment used: Rolling walker (2 wheels) Transfers: Sit to/from Stand, Bed to chair/wheelchair/BSC Sit to Stand: Contact guard assist, Min assist     Step pivot transfers: Contact guard assist           Balance Overall balance assessment: Needs assistance Sitting-balance support: No upper extremity supported, Feet supported Sitting  balance-Leahy Scale: Good     Standing balance support: Reliant on assistive device for balance Standing balance-Leahy Scale: Poor                             ADL either performed or assessed with clinical judgement   ADL                       Lower Body Dressing: Minimal assistance;Sit to/from stand   Toilet Transfer: Contact guard assist;Rolling walker (2 wheels);Ambulation;Regular Toilet                  Extremity/Trunk Assessment Upper Extremity Assessment Upper Extremity Assessment: Overall WFL for tasks assessed   Lower Extremity Assessment Lower Extremity Assessment: Defer to PT evaluation   Cervical / Trunk Assessment Cervical / Trunk Assessment: Normal    Vision Patient Visual Report: No change from baseline     Perception Perception Perception: Not tested   Praxis Praxis Praxis: Not tested    Cognition Arousal: Alert Behavior During Therapy: WFL for tasks assessed/performed Overall Cognitive Status: Within Functional Limits for tasks assessed                                                             Pertinent Vitals/ Pain       Pain Assessment Pain Assessment: No/denies pain Pain Intervention(s):  Monitored during session                                                          Frequency  Min 1X/week        Progress Toward Goals  OT Goals(current goals can now be found in the care plan section)     Acute Rehab OT Goals OT Goal Formulation: With patient Time For Goal Achievement: 05/05/23 Potential to Achieve Goals: Good  Plan      Co-evaluation                 AM-PAC OT "6 Clicks" Daily Activity     Outcome Measure   Help from another person eating meals?: None Help from another person taking care of personal grooming?: A Little Help from another person toileting, which includes using toliet, bedpan, or urinal?: A Little Help from another person  bathing (including washing, rinsing, drying)?: A Little Help from another person to put on and taking off regular upper body clothing?: A Little Help from another person to put on and taking off regular lower body clothing?: A Little 6 Click Score: 19    End of Session Equipment Utilized During Treatment: Rolling walker (2 wheels)  OT Visit Diagnosis: Unsteadiness on feet (R26.81);Muscle weakness (generalized) (M62.81)   Activity Tolerance Patient tolerated treatment well   Patient Left in chair;with call bell/phone within reach   Nurse Communication Mobility status        Time: 1358-1410 OT Time Calculation (min): 12 min  Charges: OT General Charges $OT Visit: 1 Visit OT Treatments $Self Care/Home Management : 8-22 mins  04/19/2023  RP, OTR/L  Acute Rehabilitation Services  Office:  [phone]   Suzanna Obey 04/19/2023, 2:25 PM

## 2023-04-19 NOTE — Progress Notes (Addendum)
Patient ID: Austin [name], male   DOB: [birth year], 82 y.o.   MRN: [id number]     Advanced Heart Failure Rounding Note  PCP-Cardiologist: None   Subjective:   Patient was paced out of slow VT on 9/13.  Since then, has been in and out of slow VT.  Was out of VT on 9/14 but went back in slow VT 9/15 Back in slow VT for a couple of hours rate around 100.  When out of VT, he has had atrial tachycardia with intermittent RV pacing. EP saw and programmed ( RV lead only) to 100 . Switched to lasix drip and given metolazone. 9/16 Back in VT  9/17 VT Paced out. S/P Barostimulator removal.  9/19 Transitioned to PO amiodarone. Lasix gtt d/c 9/20 wound cx + pseudomonas. Cipro started. 9/20 paced out of VT   Off inotropes due to VT. Co-ox 63%.  Diuretics held yesterday with rise in SCr. Scr 3.5 -> 3.8 -> 3.62>3.49>3.88>3.46. weight up 1lb. CVP 8/9 today.   A-V Paced. No further VT   Feels sleepy, tired and constipated this morning. Breathing feels fine. Feels like his fluid has improved.   Objective:   Weight Range: 90.9 kg Body mass index is 26.44 kg/m.   Vital Signs:   Temp:  [97.8 F (36.6 C)-98.6 F (37 C)] 97.8 F (36.6 C) (09/24 0807) Pulse Rate:  [84-90] 89 (09/24 0600) Resp:  [12-27] 13 (09/24 0800) BP: (88-109)/(49-78) 105/73 (09/24 0800) SpO2:  [85 %-100 %] 98 % (09/24 0800) Weight:  [90.7 kg-90.9 kg] 90.9 kg (09/24 0500) Last BM Date : 04/18/23  Weight change: Filed Weights   04/18/23 0500 04/18/23 1500 04/19/23 0500  Weight: 90.3 kg 90.7 kg 90.9 kg    Intake/Output:   Intake/Output Summary (Last 24 hours) at 04/19/2023 0908 Last data filed at 04/19/2023 0800 Gross per 24 hour  Intake 423 ml  Output 1535 ml  Net -1112 ml     CVP 8/9 Physical Exam  General:  elderly appearing.  No respiratory difficulty HEENT: normal Neck: supple. JVD ~8 cm. Carotids 2+ bilat; no bruits. No lymphadenopathy or thyromegaly appreciated. Cor: PMI nondisplaced. Regular rate &  rhythm. No rubs, gallops or murmurs. Lungs: clear Abdomen: soft, nontender, nondistended. No hepatosplenomegaly. No bruits or masses. Good bowel sounds. Extremities: no cyanosis, clubbing, rash, +1 BLE edema to thigh. PICC LUE. +UNNA boots GU: +foley Neuro: alert & oriented x 3, cranial nerves grossly intact. moves all 4 extremities w/o difficulty. Affect pleasant.  Telemetry   A-V paced 80s No further VT (personally reviewed)  EKG    No new EKG to review   Labs    CBC Recent Labs    04/17/23 0305 04/18/23 0306  WBC 7.5 7.0  HGB 9.3* 9.3*  HCT 29.7* 29.6*  MCV 95.8 96.1  PLT 93* 90*   Basic Metabolic Panel Recent Labs    57/84/69 0306 04/19/23 0330  NA 130* 130*  K 3.4* 3.6  CL 91* 90*  CO2 27 29  GLUCOSE 166* 101*  BUN 48* 46*  CREATININE 3.88* 3.46*  CALCIUM 8.2* 8.9  MG 2.4  --    Liver Function Tests No results for input(s): "AST", "ALT", "ALKPHOS", "BILITOT", "PROT", "ALBUMIN" in the last 72 hours.   No results for input(s): "LIPASE", "AMYLASE" in the last 72 hours. Cardiac Enzymes No results for input(s): "CKTOTAL", "CKMB", "CKMBINDEX", "TROPONINI" in the last 72 hours.  BNP: BNP (last 3 results) Recent Labs    02/03/23 1502 03/23/23  1045 04/06/23 1026  BNP 453.3* 352.5* 1,258.7*    ProBNP (last 3 results) Recent Labs    12/16/22 0959  PROBNP 3,692*   D-Dimer No results for input(s): "DDIMER" in the last 72 hours. Hemoglobin A1C No results for input(s): "HGBA1C" in the last 72 hours. Fasting Lipid Panel No results for input(s): "CHOL", "HDL", "LDLCALC", "TRIG", "CHOLHDL", "LDLDIRECT" in the last 72 hours. Thyroid Function Tests No results for input(s): "TSH", "T4TOTAL", "T3FREE", "THYROIDAB" in the last 72 hours.  Invalid input(s): "FREET3"  Other results:  Imaging   No results found.  Medications:   Scheduled Medications:  amiodarone  400 mg Oral BID   Chlorhexidine Gluconate Cloth  6 each Topical Daily   ciprofloxacin  500  mg Oral Q breakfast   levothyroxine  88 mcg Oral QAC breakfast   melatonin  5 mg Oral QHS   multivitamin with minerals  1 tablet Oral q AM   mupirocin ointment  1 Application Topical Daily   quiniDINE gluconate  324 mg Oral BID   sodium chloride flush  10-40 mL Intracatheter Q12H   sodium chloride flush  3 mL Intravenous Q12H   [START ON 04/20/2023] warfarin  2.5 mg Oral q1600   warfarin  5 mg Oral ONCE-1600   Warfarin - Pharmacist Dosing Inpatient   Does not apply q1600    Infusions:  sodium chloride      PRN Medications: sodium chloride, acetaminophen, nitroGLYCERIN, ondansetron (ZOFRAN) IV, mouth rinse, polyethylene glycol, sodium chloride flush, sodium chloride flush  Patient Profile  82 y/o male w/ 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    Now readmitted for ICD shocks for VT and a/c CHF.  Assessment/Plan  1. VT: History of VT and VT ablation in 2017.  MDT ICD.  Redo VT ablation in 1/23 (Dr. Marquette Saa).  He is on amiodarone, this was increased to 200 mg daily as outpatient with recurrent VT.  He has tolerated amiodarone poorly historically but is doing ok with it today.  In 7/23, slow VT was pace-terminated in the office, then he had an ICD shock for VT later in 7/23.  He was started on quinidine after this.  He is followed by Dr. Ladona Ridgel.  Now with 2 further ICD shocks for VT prior to this admission, had been in slow VT rate 100s-110s this admission.  Paced out of VT 9/13 by EP, since then has been in and out of slow VT.  At baseline, he RV paces most of the time. EP decreased pacing rate to 85 bpm. He has remained out of VT over the last 3 days. Now on PO amio, stable.  - Continue PO amio 400 mg bid   - Continue quinidine.  2.  Acute on chronic systolic CHF: Long-standing, suspected nonischemic cardiomyopathy.  Had RHC on 02/26/21 with normal filling pressures and mildly low cardiac output.  Echo in 4/23 showed  EF 20-25% with diffuse hypokinesis and septal-lateral dyssynchrony, mildly decreased RV systolic function, mechanical MV with normal function, dilated IVC.  He has a [employer] CRT-D device present, but the LV lead is not functional.  He has complete heart block, so has been RV paced chronically with a wide QRS.  Has had difficulty tolerating GDMT historically with CKD and hypotension.  Currently off  his home GDMT due to low BP and elevated creatinine.  Echo this admission with EF 20%, mechanical MV mean gradient 8 mmHg.  Co-ox 69% today, CVP 8/9.  Creatinine better today after holding diuretics yesterday 3.8 => 3.62>3.49>3.88>3.46, UOP decent yesterday despite holding diuretics. Have not used inotropes due to recurrent VT.  - Diuretics held yesterday. Plan to start Torsemide 40 mg BID today.  - I worry that he has end-stage biventricular HF and progressive renal failure. Likely not candidate for HD given age and end-stage HF. May be headed toward Palliative Care pathway.  SCr improving today - Off Spironolactone, valsartan, and Toprol XL with low SBP and AKI.  - Off digoxin due to persistently elevated level.  - He did not tolerate SGLT2 inhibitor in the past.  - The patient has CHB and has been chronically RV pacing as his LV lead is nonfunctional.  This is not ideal and creates dyssynchrony with a very wide QRS.  Left bundle lead placement discussed in past with Dr. Ladona Ridgel but thought risk would be too high and left bundle lead was not attempted.   - Barostimulator device placed but complicated by chest wall hematoma as below and has been removed.  3. Atrial fibrillation/flutter, atrial tachycardia: Paroxysmal.  Earlier this admission, when out of VT, he has been in atrial tachycardia. Has been stable v-pacing.  - Continue  amiodarone - Continue warfarin.  4. Complete heart block: Patient is pacer dependent due to CHB.  - See above, upgrade to functional CRT thought to be too risky due to need to extract old CS lead.  5. OSA: Severe. Continue CPAP 6. Mechanical mitral valve: Mean gradient higher on echo this admission at 8 mmHg. INR 2.6.  - Continue warfarin INR goal 2.5-3.5.  7. Chest Wall Hematoma: post barostimulator complication, in setting of coumadin for mechanical MV.  s/p I&D for evacuation of hematoma and placement of wound vac + placement of antibiotic beads on 8/30. Barostimulator device removed 9/17, still has drain in place. Wound cx with Pseudomonas.  - ID has seen, plan for 10 day course of Ciprofloxacin.  - wound vac management per [name]  - vascular following.   8. AKI on CKD stage 3: Creatinine baseline 1.5-2.  2.7=>2.38 =>2.68 => 3=>3.50=> 3.8 => 3.62>3.49>3.88>3.46 today.  - see plan as above - restart PO diuretics - If creatinine starts to rise again can consider discussing options with Renal but likely not candidate for HD given age and end-stage HF. May be headed toward Palliative Care pathway   Length of Stay: [address], NP  04/19/2023, 9:08 AM  Advanced Heart Failure Team Pager [phone] (M-F; 7a - 5p)  Please contact [facility] for night-coverage after hours (5p -7a ) and weekends on [email or web address]  Patient seen and examined with the above-signed Advanced Practice Provider and/or Housestaff. I personally reviewed laboratory data, imaging studies and relevant notes. I independently examined the patient and formulated the important aspects of the plan. I have edited the note to reflect any of my changes or salient points. I have personally discussed the plan with the patient and/or family.   Patient seen and examined with the above-signed Advanced Practice Provider and/or Housestaff. I personally reviewed laboratory data, imaging studies and relevant notes. I independently  examined the patient and formulated the important aspects of the plan. I have edited the note to reflect any of my changes or salient points. I have personally discussed the plan with the patient and/or  family.  Feels ok. Weak. No CP or SOB. Co-ox 69% off milrinone. CVP 8-9  General:  Weak appearing. No resp difficulty HEENT: normal Neck: supple. JVP 8-9 . Carotids 2+ bilat; no bruits. No lymphadenopathy or thryomegaly appreciated. UVO:ZDGUYQI rate & rhythm. No rubs, gallops or murmurs. Lungs: clear Abdomen: soft, nontender, nondistended. No hepatosplenomegaly. No bruits or masses. Good bowel sounds. Extremities: no cyanosis, clubbing, rash, tr edema Neuro: alert & orientedx3, cranial nerves grossly intact. moves all 4 extremities w/o difficulty. Affect pleasant  Appears to have stabilized off milrinone. Restart po diuretics. Ambulate. Wound ok. Can go to floor. Suspect he can d/c soon.  Arvilla Meres, MD  3:50 PM

## 2023-04-19 NOTE — Plan of Care (Signed)
Problem: Nutrition: Goal: Adequate nutrition will be maintained Outcome: Completed/Met   Problem: Coping: Goal: Level of anxiety will decrease Outcome: Completed/Met

## 2023-04-19 NOTE — Progress Notes (Signed)
ANTICOAGULATION CONSULT NOTE - Follow Up  Pharmacy Consult for heparin > warfarin  Indication: atrial fibrillation and mechanical mitral valve   No Known Allergies  Patient Measurements: Height: 6\' 1"  (185.4 cm) Weight: 90.9 kg (200 lb 6.4 oz) IBW/kg (Calculated) : 79.9 Heparin Dosing Weight: 94.3 kg   Vital Signs: Temp: 97.8 F (36.6 C) (09/24 0807) Temp Source: Oral (09/24 0807) BP: 105/73 (09/24 0800) Pulse Rate: 89 (09/24 0600)  Labs: Recent Labs    04/17/23 0305 04/17/23 1655 04/18/23 0306 04/19/23 0330  HGB 9.3*  --  9.3*  --   HCT 29.7*  --  29.6*  --   PLT 93*  --  90*  --   LABPROT 28.2*  --  31.2* 28.0*  INR 2.6*  --  3.0* 2.6*  HEPARINUNFRC 0.35  --   --   --   CREATININE 3.62* 3.49* 3.88* 3.46*    Estimated Creatinine Clearance: 19.2 mL/min (A) (by C-G formula based on SCr of 3.46 mg/dL (H)).   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   Conductive hearing loss    uses hearing aids   Dysrhythmia    A-fib   History of blood transfusion 03/24/2023   Hypothyroidism    Intraspinal abscess    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   Nonischemic cardiomyopathy (HCC)    Psychosexual dysfunction with inhibited sexual excitement    S/P mitral valve replacement    Syncope and collapse    Unspecified sleep apnea    last sleep study 11/07, uses cpap   Ventricular tachycardia (HCC)    VT (ventricular tachycardia) (HCC)     Medications:  Scheduled:   amiodarone  400 mg Oral BID   Chlorhexidine Gluconate Cloth  6 each Topical Daily   ciprofloxacin  500 mg Oral Q breakfast   lactulose  30 g Oral TID   levothyroxine   88 mcg Oral QAC breakfast   melatonin  5 mg Oral QHS   multivitamin with minerals  1 tablet Oral q AM   mupirocin ointment  1 Application Topical Daily   polyethylene glycol  17 g Oral BID   quiniDINE gluconate  324 mg Oral BID   sodium chloride flush  3 mL Intravenous Q12H   torsemide  20 mg Oral BID   [START ON 04/20/2023] warfarin  2.5 mg Oral q1600   warfarin  5 mg Oral ONCE-1600   Warfarin - Pharmacist Dosing Inpatient   Does not apply q1600    Assessment: 82 yom presenting after ICD shocks - on enoxaparin and warfarin PTA (LD 9/10 PM). PTA regimen 2.5 mg TTSS and  5 mg MWF. S/P undergoing bridge anticoagulation given recent admission for Barostim implant complicated with L chest wall hematoma requiring I&D > barostim removed 9/17 and vac removed.  INR today fell to 2.6 at bottom of goal range for [facility] Heparin drip stopped 9/22 No bleeding noted, h/h stable, pltc low stable  Also on cipro for pseudomonas wound infection - can increase INR PTA warfarin 2.5mg  TTSS / 5mg  MMWF  Goal of Therapy:  INR 3-3.5   Heparin level: 0.3-0.5 Monitor platelets by anticoagulation protocol: Yes  Plan:  Warfarin 5mg  x1 today then 2.5mg  daily  Daily INR    Leota Sauers Pharm.D. CPP, BCPS Clinical Pharmacist [phone] 04/19/2023 9:54 AM   Please check AMION for all [facility] Pharmacy numbers 04/19/2023

## 2023-04-20 DIAGNOSIS — I5023 Acute on chronic systolic (congestive) heart failure: Secondary | ICD-10-CM | POA: Diagnosis not present

## 2023-04-20 LAB — BASIC METABOLIC PANEL
Anion gap: 13 (ref 5–15)
BUN: 37 mg/dL — ABNORMAL HIGH (ref 8–23)
CO2: 28 mmol/L (ref 22–32)
Calcium: 8.6 mg/dL — ABNORMAL LOW (ref 8.9–10.3)
Chloride: 91 mmol/L — ABNORMAL LOW (ref 98–111)
Creatinine, Ser: 2.99 mg/dL — ABNORMAL HIGH (ref 0.61–1.24)
GFR, Estimated: 20 mL/min — ABNORMAL LOW (ref >60)
Glucose, Bld: 101 mg/dL — ABNORMAL HIGH (ref 70–99)
Potassium: 3.5 mmol/L (ref 3.5–5.1)
Sodium: 132 mmol/L — ABNORMAL LOW (ref 135–145)

## 2023-04-20 LAB — COOXEMETRY PANEL
Carboxyhemoglobin: 2.7 % — ABNORMAL HIGH (ref 0.5–1.5)
Methemoglobin: 1.3 % (ref 0.0–1.5)
O2 Saturation: 66.6 %
Total hemoglobin: 10 g/dL — ABNORMAL LOW (ref 12.0–16.0)

## 2023-04-20 LAB — PROTIME-INR
INR: 3 — ABNORMAL HIGH (ref 0.8–1.2)
Prothrombin Time: 31.4 seconds — ABNORMAL HIGH (ref 11.4–15.2)

## 2023-04-20 MED ORDER — POTASSIUM CHLORIDE CRYS ER 20 MEQ PO TBCR
20.0000 meq | EXTENDED_RELEASE_TABLET | Freq: Once | ORAL | Status: AC
  Administered 2023-04-20: 20 meq via ORAL
  Filled 2023-04-20: qty 1

## 2023-04-20 NOTE — Progress Notes (Signed)
Physical Therapy Treatment Patient Details Name: Austin [name] MRN: [id number] DOB: Mar 29, 1941 Today's Date: 04/20/2023   History of Present Illness Pt is an 82 y.o. male who presented 04/06/23 with ICD shocks. Of note, pt underwent Barostim device 03/17/23. S/p removal of L sided Barostim device and redo L carotid artery exposure 9/17. PMH: Mechanical MV s/p MV endocarditis, VT, NICM, CHB and PAF/AFL, CKD, CHF, syncope, sleep apnea    PT Comments  Patient progressing with distance and level of assistance with gait over last session.  He admits, however, his recovery will be a slow process.  Remains with PICC line, L flank JP drain and intensive lines for monitoring.  Continue to feel he is most appropriate for SNF level rehab at d/c.  PT will continue to follow while in the acute setting.     If plan is discharge home, recommend the following: A little help with walking and/or transfers;A little help with bathing/dressing/bathroom;Assistance with cooking/housework;Assist for transportation   Can travel by private vehicle     Yes  Equipment Recommendations  Other (comment) (defer to next venue of care)    Recommendations for Other Services       Precautions / Restrictions Precautions Precautions: Fall Precaution Comments: JP drain L chest; watch BP     Mobility  Bed Mobility Overal bed mobility: Needs Assistance Bed Mobility: Supine to Sit, Sit to Supine     Supine to sit: Min assist, HOB elevated Sit to supine: Min assist, HOB elevated   General bed mobility comments: minA for trunk elevation, able to bring LEs off EOB, assist to guide feet up onto bed sit to supine    Transfers Overall transfer level: Needs assistance Equipment used: Rollator (4 wheels) Transfers: Sit to/from Stand Sit to Stand: Min assist, From elevated surface           General transfer comment: elevated bed due to pt's height; assist for safety    Ambulation/Gait Ambulation/Gait  assistance: Min assist Gait Distance (Feet): 350 Feet Assistive device: Rollator (4 wheels) Gait Pattern/deviations: Step-through pattern, Decreased stride length, Trunk flexed, Shuffle       General Gait Details: using rollator though handles too short so mildly flexed posture, assist for balance and safety with turns   Stairs             Wheelchair Mobility     Tilt Bed    Modified Rankin (Stroke Patients Only)       Balance Overall balance assessment: Needs assistance Sitting-balance support: Feet supported Sitting balance-Leahy Scale: Good     Standing balance support: Bilateral upper extremity supported, Reliant on assistive device for balance Standing balance-Leahy Scale: Poor                              Cognition Arousal: Alert Behavior During Therapy: WFL for tasks assessed/performed Overall Cognitive Status: Within Functional Limits for tasks assessed                                          Exercises      General Comments General comments (skin integrity, edema, etc.): BP stable 104/79 after ambulation      Pertinent Vitals/Pain Pain Assessment Pain Assessment: No/denies pain    Home Living  Prior Function            PT Goals (current goals can now be found in the care plan section) Progress towards PT goals: Progressing toward goals    Frequency    Min 1X/week      PT Plan      Co-evaluation              AM-PAC PT "6 Clicks" Mobility   Outcome Measure  Help needed turning from your back to your side while in a flat bed without using bedrails?: A Little Help needed moving from lying on your back to sitting on the side of a flat bed without using bedrails?: A Little Help needed moving to and from a bed to a chair (including a wheelchair)?: A Little Help needed standing up from a chair using your arms (e.g., wheelchair or bedside chair)?: A Little Help  needed to walk in hospital room?: A Little Help needed climbing 3-5 steps with a railing? : Total 6 Click Score: 16    End of Session Equipment Utilized During Treatment: Gait belt Activity Tolerance: Patient tolerated treatment well Patient left: in bed;with call bell/phone within reach   PT Visit Diagnosis: Other abnormalities of gait and mobility (R26.89);Muscle weakness (generalized) (M62.81)     Time: 3474-2595 PT Time Calculation (min) (ACUTE ONLY): 29 min  Charges:    $Gait Training: 8-22 mins $Therapeutic Activity: 8-22 mins PT General Charges $$ ACUTE PT VISIT: 1 Visit                     Sheran Lawless, PT Acute Rehabilitation Services [phone] 04/20/2023    Elray Mcgregor 04/20/2023, 5:02 PM

## 2023-04-20 NOTE — Progress Notes (Addendum)
ANTICOAGULATION CONSULT NOTE - Follow Up  Pharmacy Consult for heparin > warfarin  Indication: atrial fibrillation and mechanical mitral valve   No Known Allergies  Patient Measurements: Height: 6\' 1"  (185.4 cm) Weight: 88.9 kg (195 lb 15.8 oz) IBW/kg (Calculated) : 79.9 Heparin Dosing Weight: 94.3 kg   Vital Signs: Temp: 97.7 F (36.5 C) (09/25 0714) Temp Source: Oral (09/25 0714) BP: 90/72 (09/25 0714) Pulse Rate: 84 (09/25 0714)  Labs: Recent Labs    04/18/23 0306 04/19/23 0330 04/20/23 0515  HGB 9.3*  --   --   HCT 29.6*  --   --   PLT 90*  --   --   LABPROT 31.2* 28.0* 31.4*  INR 3.0* 2.6* 3.0*  CREATININE 3.88* 3.46* 2.99*    Estimated Creatinine Clearance: 22.3 mL/min (A) (by C-G formula based on SCr of 2.99 mg/dL (H)).   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   Conductive hearing loss    uses hearing aids   Dysrhythmia    A-fib   History of blood transfusion 03/24/2023   Hypothyroidism    Intraspinal abscess    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   Nonischemic cardiomyopathy (HCC)    Psychosexual dysfunction with inhibited sexual excitement    S/P mitral valve replacement    Syncope and collapse    Unspecified sleep apnea    last sleep study 11/07, uses cpap   Ventricular tachycardia (HCC)    VT (ventricular tachycardia) (HCC)     Medications:  Scheduled:   amiodarone  400 mg Oral BID   Chlorhexidine Gluconate Cloth  6 each Topical Daily   ciprofloxacin  500 mg Oral Q breakfast   levothyroxine  88 mcg Oral QAC breakfast   melatonin  5 mg Oral QHS   multivitamin with minerals  1 tablet Oral q AM    mupirocin ointment  1 Application Topical Daily   polyethylene glycol  17 g Oral BID   quiniDINE gluconate  324 mg Oral BID   sodium chloride flush  3 mL Intravenous Q12H   torsemide  20 mg Oral BID   warfarin  2.5 mg Oral q1600   Warfarin - Pharmacist Dosing Inpatient   Does not apply q1600    Assessment: 82 yom presenting after ICD shocks - on enoxaparin and warfarin PTA (LD 9/10 PM). PTA regimen 2.5 mg TTSS and  5 mg MWF. S/P undergoing bridge anticoagulation given recent admission for Barostim implant complicated with L chest wall hematoma requiring I&D > barostim removed 9/17 and vac removed.  INR increased up to 3 (therapeutic) from 2.6 yesterday. Heparin drip stopped 9/22. CBC stable on last check 9/23 - Hgb 9.3, plt 90. Also on cipro for pseudomonas wound infection - can increase INR. Good oral intake (50-100% documented yesterday).  PTA warfarin 2.5mg  TTSS / 5mg  MWF  Goal of Therapy:  INR 3-3.5   Heparin level: 0.3-0.5 Monitor platelets by anticoagulation protocol: Yes   Plan:  Warfarin 2.5 mg daily starting today  Daily INR, CBC tomorrow   Thank you for allowing pharmacy to participate in this patient's care,  Sherron Monday, PharmD, BCCCP Clinical Pharmacist  Phone: [phone]  04/20/2023 8:47 AM  Please check AMION for all [facility] Pharmacy phone numbers After 10:00 PM, call Main Pharmacy [phone]

## 2023-04-20 NOTE — Consult Note (Signed)
[facility] Consult   04/20/2023  Jerrod Skahan 1941/01/07 [id number]  [facility] [[facility]]  Accountable Care Organization [ACO] Patient: [facility]  Primary Care Provider: [name], MD with [place] [facility], this provider is listed for the transition of care follow up appointments and calls for post hospital   [facility] Liaison rounded on unit, patient 14 day LOS included ICU level of care and transitioned to Progressive.  Patient speaking with Rehab staff member when rounding.   The patient was screened for 7 day readmission hospitalization with noted high risk score for unplanned readmission risk 3 hospital admissions in 6 months.  The patient was assessed for potential [facility]) Care Management service needs for post hospital transition for care coordination. Review of patient's electronic medical record reveals patient is followed by Advanced HF team noted.   Plan: [facility] Liaison will continue to follow progress and disposition to asess for post hospital community care coordination/management needs.  Referral request for community care coordination: pending disposition and needs, anticipate TOC follow up with PCP team.   [facility] Management/Population Health does not replace or interfere with any arrangements made by the Inpatient Transition of Care team.   For questions contact:   Charlesetta Shanks, RN, BSN, CCM [place]  [facility], [facility] Liaison Direct Dial: [phone] or secure chat Website: [email or web address]

## 2023-04-20 NOTE — Progress Notes (Signed)
Order for foley removal. Pt ambulating with PT. Removed. Cath tip intact and pt tolerated well

## 2023-04-20 NOTE — Care Management Important Message (Signed)
Important Message  Patient Details  Name: Austin [name] MRN: [id number] Date of Birth: 18-Oct-1940   Important Message Given:  Yes - Medicare IM     Dorena Bodo 04/20/2023, 3:02 PM

## 2023-04-20 NOTE — Discharge Summary (Addendum)
Advanced Heart Failure Team  Discharge Summary   Patient ID: Austin [name] MRN: [id number], DOB/AGE: 80/31/1942 80 y.o. 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    Readmitted on 9/11 for ICD shocks for VT and a/c CHF w/ volume overload. He was placed on IV amio. Rates improved but was in and out of slow VT during admission. Paced out of VT 9/13 by EP. EP decreased pacing rate to 85 bpm. Had improvement and VT remained quiescent. Transitioned back to PO amiodarone 400 mg bid and quinidine. Of note he had bilateral upper extremity IV infiltrates.   AHF team consulted for volume management. Diuresis was c/b worsening renal fx/ AKI. He required inotropic support w/ milrinone to help augment diuresis. Volume status improved as did renal fx. Was transitioned back to PO torsemide 40 mg twice a day.  He was weaned of milrinone w/ stable co-ox. Off GDMT due to AKI/shock.   Once improved from a cardiac standpoint, he underwent Barostimulator device removal by [facility] on 9/17. Wound cx grew Pseudomonas. ID consulted and recommended 10 day course of Ciprofloxacin. He will need  3 more days to complete. He  will continue JP drain until his follow up with Vascular Surgery and will need JP emptied daily.   Due to deconditioning PT/OT recommended SNF. Plan to continue PT/OT. At SNF. Discharge to [facility].   He will contine to be followed closely in the HF clinic. F/U with EP and Vascular arranged. He will need INR check 04/26/23 with results provided to the Coumadin Clinic [phone].   Discharge Vitals: Blood pressure 104/75, pulse 84, temperature (!) 97.3 F (36.3 C), temperature source Oral, resp. rate 20, height 6\' 1"  (1.854 m), weight 88.2 kg, SpO2 97%.  Labs: Lab Results  Component Value Date   WBC 7.7 04/21/2023   HGB 9.2 (L) 04/21/2023   HCT 30.3 (L) 04/21/2023   MCV 97.1 04/21/2023   PLT 104 (L) 04/21/2023    Recent Labs  Lab 04/22/23 0540  NA 132*  K 3.4*  CL 94*  CO2 31  BUN 32*  CREATININE 3.12*  CALCIUM 7.9*  GLUCOSE 90   Lab Results  Component Value Date   CHOL 158 08/23/2015   HDL 50 08/23/2015   LDLCALC 82 08/23/2015   TRIG 130 08/23/2015   BNP (last 3 results) Recent Labs    02/03/23 1502 03/23/23 1045 04/06/23 1026  BNP 453.3* 352.5* 1,258.7*    ProBNP (last 3 results) Recent Labs    12/16/22 0959  PROBNP 3,692*     Diagnostic Studies/Procedures   No results found.  Discharge Medications   Allergies as of 04/22/2023   No Known Allergies      Medication List     STOP  taking these medications    amoxicillin-clavulanate 875-125 MG tablet Commonly known as: AUGMENTIN   doxycycline 100 MG tablet Commonly known as: VIBRA-TABS   enoxaparin 100 MG/ML injection Commonly known as: LOVENOX   furosemide 20 MG tablet Commonly known as: LASIX   HYDROcodone-acetaminophen 5-325 MG tablet Commonly known as: Norco   zinc gluconate 50 MG tablet       TAKE these medications    acetaminophen 500 MG tablet Commonly known as: TYLENOL Take 1,000 mg by mouth every 8 (eight) hours as needed for headache, fever or mild pain.    amiodarone 200 MG tablet Commonly known as: PACERONE Take amio 400 mg twice a day What changed:  how much to take how to take this when to take this additional instructions   amoxicillin 500 MG capsule Commonly known as: AMOXIL TAKE 4 CAPSULES BY MOUTH 30 MINUTES PRIOR TO DENTAL WORK   ciprofloxacin 500 MG tablet Commonly known as: CIPRO Take 1 tablet (500 mg total) by mouth daily with breakfast. Start taking on: April 23, 2023   levothyroxine 88 MCG tablet Commonly known as: SYNTHROID TAKE 1 TABLET BY MOUTH EVERY DAY   melatonin 5 MG Tabs Take 5 mg by mouth at bedtime.   multivitamin with minerals Tabs tablet Take 1 tablet by mouth in the morning.   mupirocin ointment 2 % Commonly known as: BACTROBAN Apply 1 Application topically daily. What changed:  when to take this reasons to take this   nitroGLYCERIN 0.4 MG SL tablet Commonly known as: NITROSTAT Place 1 tablet (0.4 mg total) under the tongue every 5 (five) minutes x 3 doses as needed for chest pain.   quiniDINE gluconate 324 MG CR tablet TAKE 1 TABLET BY MOUTH 2 TIMES DAILY.   torsemide 20 MG tablet Commonly known as: DEMADEX Take 2 tablets (40 mg total) by mouth 2 (two) times daily.   warfarin 2.5 MG tablet Commonly known as: COUMADIN Take as directed. If you are unsure how to take this medication, talk to your nurse or doctor. Original instructions: Take 1 tablet (2.5 mg total) by mouth daily at 4 PM. What changed:  medication strength See the new instructions.        Disposition   The patient will be discharged in stable condition to [facility] Discharge Instructions     Diet - low sodium heart healthy   Complete by: As directed    Discharge instructions   Complete by: As directed    Record and empty JP drain daily.  CHeck INR 04/27/23 with pharmacy to adjust coumadin dose.   Increase activity slowly   Complete by: As directed        Follow-up Information     [facility] Follow up.   Specialty: Cardiology Why: 05/05/23 at 10:30 AM   Hospital Follow-up in the Advanced Heart Failure Clinic (Dr. Alford Highland office) Contact information: [address] [place] [address] [phone]        [facility] Follow up in 2 week(s).   Specialty: Vascular Surgery Why: Office will call you to arrange your appt (sent). Contact information: [address] [place] [address] [phone]                  Duration of Discharge Encounter: Greater than 35 minutes   Signed, Tonye Becket NP-C  2:36 PM  Patient seen and examined with the above-signed Advanced  Practice Provider and/or Housestaff. I personally reviewed laboratory data, imaging studies and relevant notes. I independently examined the patient and formulated the important aspects of the plan. I have edited the note to reflect any of my changes or salient points. I have personally discussed the plan with the patient and/or family.  He is much improved. Ready for d/c on above meds. Has f/u arranged in AHF Clinic.   Arvilla Meres, MD  3:50 PM

## 2023-04-20 NOTE — Progress Notes (Signed)
Mobility Specialist Progress Note:   04/20/23 1000  Mobility  Activity Ambulated with assistance in hallway  Level of Assistance Contact guard assist, steadying assist  Assistive Device Four wheel walker  Distance Ambulated (ft) 200 ft  Activity Response Tolerated well  Mobility Referral Yes  $Mobility charge 1 Mobility  Mobility Specialist Start Time (ACUTE ONLY) 0911  Mobility Specialist Stop Time (ACUTE ONLY) 0950  Mobility Specialist Time Calculation (min) (ACUTE ONLY) 39 min    Pre Mobility: 85 HR,  95% SpO2 During Mobility: 94 HR Post Mobility:  84 HR  Pt received in bed, agreeable to mobility. Asymptomatic throughout w/ no complaints. Pt left in chair with call bell and all needs met.  D'Vante Earlene Plater Mobility Specialist Please contact via Special educational needs teacher or Rehab office at [phone]

## 2023-04-20 NOTE — Plan of Care (Signed)
Patient has tolerated activity well this shift. Denies pain and no s/s of anxiety.  PT in room working with pt at this time. Takes meds whole without difficulty. No distress noted.

## 2023-04-20 NOTE — Progress Notes (Addendum)
  Progress Note    04/20/2023 8:29 AM 8 Days Post-Op  Subjective:  feeling good this morning   Vitals:   04/20/23 0315 04/20/23 0714  BP: (!) 90/59 90/72  Pulse: 85 84  Resp: 19 17  Temp: 97.6 F (36.4 C) 97.7 F (36.5 C)  SpO2: 95% 95%    Physical Exam: General:  resting comfortably in bed Lungs:  nonlabored Incisions:  left sided chest and neck incisions clean and dry, JP drain with 20cc output Extremities:  BUE wrapped  CBC    Component Value Date/Time   WBC 7.0 04/18/2023 0306   RBC 3.08 (L) 04/18/2023 0306   HGB 9.3 (L) 04/18/2023 0306   HGB 16.6 12/04/2020 1445   HCT 29.6 (L) 04/18/2023 0306   HCT 50.1 12/04/2020 1445   PLT 90 (L) 04/18/2023 0306   PLT 158 12/04/2020 1445   MCV 96.1 04/18/2023 0306   MCV 94 12/04/2020 1445   MCH 30.2 04/18/2023 0306   MCHC 31.4 04/18/2023 0306   RDW 19.1 (H) 04/18/2023 0306   RDW 13.7 12/04/2020 1445   LYMPHSABS 0.3 (L) 04/06/2023 1030   MONOABS 1.1 (H) 04/06/2023 1030   EOSABS 0.1 04/06/2023 1030   BASOSABS 0.1 04/06/2023 1030    BMET    Component Value Date/Time   NA 132 (L) 04/20/2023 0515   NA 139 01/29/2021 0853   K 3.5 04/20/2023 0515   CL 91 (L) 04/20/2023 0515   CO2 28 04/20/2023 0515   GLUCOSE 101 (H) 04/20/2023 0515   BUN 37 (H) 04/20/2023 0515   BUN 18 01/29/2021 0853   CREATININE 2.99 (H) 04/20/2023 0515   CREATININE 1.22 (H) 09/12/2015 0846   CALCIUM 8.6 (L) 04/20/2023 0515   GFRNONAA 20 (L) 04/20/2023 0515   GFRAA 56 (L) 04/30/2020 1144    INR    Component Value Date/Time   INR 3.0 (H) 04/20/2023 0515     Intake/Output Summary (Last 24 hours) at 04/20/2023 0829 Last data filed at 04/20/2023 0318 Gross per 24 hour  Intake 360 ml  Output 1905 ml  Net -1545 ml      Assessment/Plan:  82 y.o. male is 8 days post op, s/p: barostim removal   -No complaints this morning. No further episodes of VT -All incisions healing appropriately and dry -JP drain with 20cc output in 24H, continue  to suction   [name], PA-C [facility] [phone] 04/20/2023 8:29 AM

## 2023-04-20 NOTE — Progress Notes (Addendum)
Patient ID: Allan Modzelewski, male   DOB: [birth year], 82 y.o.   MRN: [id number]     Advanced Heart Failure Rounding Note  PCP-Cardiologist: None   Subjective:   Patient was paced out of slow VT on 9/13.  Since then, has been in and out of slow VT.  Was out of VT on 9/14 but went back in slow VT 9/15 Back in slow VT for a couple of hours rate around 100.  When out of VT, he has had atrial tachycardia with intermittent RV pacing. EP saw and programmed ( RV lead only) to 100 . Switched to lasix drip and given metolazone. 9/16 Back in VT  9/17 VT Paced out. S/P Barostimulator removal.  9/19 Transitioned to PO amiodarone. Lasix gtt d/c 9/20 wound cx + pseudomonas. Cipro started. 9/20 paced out of VT   Off inotropes due to VT. Co-ox remains stable 67%. Torsemide restarted, decent UOP yesterday, 2L. SCr down 3.46>>2.99. Wt down 5 lb. CVP 5-6    A-V Paced. No further VT  BPs soft 90s systolic.   INR 3.0   Feels "much better" today. Had BM yesterday. No longer feels dizzy. Denies dyspnea. Sitting up in bed eating breakfast. Has appetite but does not like the food. He is worried about his weakness and mobility.    Objective:   Weight Range: 88.9 kg Body mass index is 25.86 kg/m.   Vital Signs:   Temp:  [97.6 F (36.4 C)-98 F (36.7 C)] 97.7 F (36.5 C) (09/25 0714) Pulse Rate:  [83-85] 84 (09/25 0714) Resp:  [13-45] 17 (09/25 0714) BP: (87-114)/(59-84) 90/72 (09/25 0714) SpO2:  [93 %-100 %] 95 % (09/25 0714) Weight:  [88.9 kg] 88.9 kg (09/25 0315) Last BM Date : 04/19/23  Weight change: Filed Weights   04/18/23 1500 04/19/23 0500 04/20/23 0315  Weight: 90.7 kg 90.9 kg 88.9 kg    Intake/Output:   Intake/Output Summary (Last 24 hours) at 04/20/2023 0719 Last data filed at 04/20/2023 0318 Gross per 24 hour  Intake 480 ml  Output 1945 ml  Net -1465 ml      Physical Exam   CVP 5-6 General:  Well appearing, elderly male. No respiratory difficulty HEENT: normal Neck:  supple. JVD 6 cm. Carotids 2+ bilat; no bruits. No lymphadenopathy or thyromegaly appreciated. Cor: PMI nondisplaced. Regular rate & rhythm. + mechanical valve sounds. + left upper chest bandage clean and dry.  Lungs: clear Abdomen: soft, nontender, nondistended. No hepatosplenomegaly. No bruits or masses. Good bowel sounds. Extremities: no cyanosis, clubbing, rash, bilateral unna boots w/ trace distal thigh edema + LUE PICC  Neuro: alert & oriented x 3, cranial nerves grossly intact. moves all 4 extremities w/o difficulty. Affect pleasant.  Telemetry   A-V paced 80s No further VT (personally reviewed)  EKG    No new EKG to review   Labs    CBC Recent Labs    04/18/23 0306  WBC 7.0  HGB 9.3*  HCT 29.6*  MCV 96.1  PLT 90*   Basic Metabolic Panel Recent Labs    40/98/11 0306 04/19/23 0330 04/20/23 0515  NA 130* 130* 132*  K 3.4* 3.6 3.5  CL 91* 90* 91*  CO2 27 29 28   GLUCOSE 166* 101* 101*  BUN 48* 46* 37*  CREATININE 3.88* 3.46* 2.99*  CALCIUM 8.2* 8.9 8.6*  MG 2.4  --   --    Liver Function Tests No results for input(s): "AST", "ALT", "ALKPHOS", "BILITOT", "PROT", "ALBUMIN" in the last  72 hours.   No results for input(s): "LIPASE", "AMYLASE" in the last 72 hours. Cardiac Enzymes No results for input(s): "CKTOTAL", "CKMB", "CKMBINDEX", "TROPONINI" in the last 72 hours.  BNP: BNP (last 3 results) Recent Labs    02/03/23 1502 03/23/23 1045 04/06/23 1026  BNP 453.3* 352.5* 1,258.7*    ProBNP (last 3 results) Recent Labs    12/16/22 0959  PROBNP 3,692*   D-Dimer No results for input(s): "DDIMER" in the last 72 hours. Hemoglobin A1C No results for input(s): "HGBA1C" in the last 72 hours. Fasting Lipid Panel No results for input(s): "CHOL", "HDL", "LDLCALC", "TRIG", "CHOLHDL", "LDLDIRECT" in the last 72 hours. Thyroid Function Tests No results for input(s): "TSH", "T4TOTAL", "T3FREE", "THYROIDAB" in the last 72 hours.  Invalid input(s):  "FREET3"  Other results:  Imaging   No results found.  Medications:   Scheduled Medications:  amiodarone  400 mg Oral BID   Chlorhexidine Gluconate Cloth  6 each Topical Daily   ciprofloxacin  500 mg Oral Q breakfast   levothyroxine  88 mcg Oral QAC breakfast   melatonin  5 mg Oral QHS   multivitamin with minerals  1 tablet Oral q AM   mupirocin ointment  1 Application Topical Daily   polyethylene glycol  17 g Oral BID   quiniDINE gluconate  324 mg Oral BID   sodium chloride flush  3 mL Intravenous Q12H   torsemide  20 mg Oral BID   warfarin  2.5 mg Oral q1600   Warfarin - Pharmacist Dosing Inpatient   Does not apply q1600    Infusions:  sodium chloride      PRN Medications: sodium chloride, acetaminophen, nitroGLYCERIN, ondansetron (ZOFRAN) IV, mouth rinse, polyethylene glycol, sodium chloride flush  Patient Profile  82 y/o male w/ 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    Now readmitted for ICD shocks for VT and a/c CHF.  Assessment/Plan  1. VT: History of VT and VT ablation in 2017.  MDT ICD.  Redo VT ablation in 1/23 (Dr. Marquette Saa).  He is on amiodarone, this was increased to 200 mg daily as outpatient with recurrent VT.  He has tolerated amiodarone poorly historically but is doing ok with it today.  In 7/23, slow VT was pace-terminated in the office, then he had an ICD shock for VT later in 7/23.  He was started on quinidine after this.  He is followed by Dr. Ladona Ridgel.  Now with 2 further ICD shocks for VT prior to this admission, had been in slow VT rate 100s-110s this admission.  Paced out of VT 9/13 by EP, since then has been in and out of slow VT.  At baseline, he RV paces most of the time. EP decreased pacing rate  to 85 bpm. He has remained out of VT over the last 3 days. Now on PO amio, stable.  - Continue PO amio 400 mg bid   - Continue quinidine.  2. Acute on chronic systolic CHF: Long-standing, suspected nonischemic cardiomyopathy.  Had RHC on 02/26/21 with normal filling pressures and mildly low cardiac output.  Echo in 4/23 showed  EF 20-25% with diffuse hypokinesis and septal-lateral dyssynchrony, mildly decreased RV systolic function, mechanical MV with normal function, dilated IVC.  He has a [employer] CRT-D device present,  but the LV lead is not functional.  He has complete heart block, so has been RV paced chronically with a wide QRS.  Has had difficulty tolerating GDMT historically with CKD and hypotension.  Currently off his home GDMT due to low BP and elevated creatinine.  Echo this admission with EF 20%, mechanical MV mean gradient 8 mmHg.  Co-ox 67% today. Back on PO diuretics. CVP 5-6. Creatinine continues to improve, 3.8 => 3.62>3.49>3.88>3.46>2.99 today, UOP decent. - Continue Torsemide 20 mg BID today.  - He has advanced biventricular HF but now stabilized off of milrinone.  - Off Spironolactone, valsartan, and Toprol XL with low SBP and AKI.  - Off digoxin due to persistently elevated level.  - He did not tolerate SGLT2 inhibitor in the past.  - The patient has CHB and has been chronically RV pacing as his LV lead is nonfunctional.  This is not ideal and creates dyssynchrony with a very wide QRS.  Left bundle lead placement discussed in past with Dr. Ladona Ridgel but thought risk would be too high and left bundle lead was not attempted.   - Barostimulator device placed but complicated by chest wall hematoma as below and has been removed.  3. Atrial fibrillation/flutter, atrial tachycardia: Paroxysmal.  Earlier this admission, when out of VT, he has been in atrial tachycardia. Has been stable v-pacing.  - Continue amiodarone - Continue warfarin.  4. Complete heart block: Patient is pacer dependent due  to CHB.  - See above, upgrade to functional CRT thought to be too risky due to need to extract old CS lead.  5. OSA: Severe. Continue CPAP 6. Mechanical mitral valve: Mean gradient higher on echo this admission at 8 mmHg. INR 3.0.  - Continue warfarin INR goal 2.5-3.5.  7. Chest Wall Hematoma: post barostimulator complication, in setting of coumadin for mechanical MV.  s/p I&D for evacuation of hematoma and placement of wound vac + placement of antibiotic beads on 8/30. Barostimulator device removed 9/17, still has drain in place. Wound cx with Pseudomonas.  - ID has seen, plan for 10 day course of Ciprofloxacin.  - vascular following.   8. AKI on CKD stage 3: Creatinine baseline 1.5-2.  2.7=>2.38 =>2.68 => 3=>3.50=> 3.8 => 3.62>3.49>3.88>3.46>2.99 today.  - see plan as above - Not candidate for HD given age and end-stage HF. May be headed toward Palliative Care pathway 9. Deconditioning: PT recommending inpatient rehab. Will place CIR consult   Length of Stay: [address] PA-C  04/20/2023, 7:19 AM  Advanced Heart Failure Team Pager [phone] (M-F; 7a - 5p)  Please contact [facility] for night-coverage after hours (5p -7a ) and weekends on [email or web address]  Patient seen and examined with the above-signed Advanced Practice Provider and/or Housestaff. I personally reviewed laboratory data, imaging studies and relevant notes. I independently examined the patient and formulated the important aspects of the plan. I have edited the note to reflect any of my changes or salient points. I have personally discussed the plan with the patient and/or family.  Weight and renal function stable off milrinone and on po diuretics. Denies CP or SOB  General:  Elderly. No resp difficulty HEENT: normal Neck: supple. no JVD. Carotids 2+ bilat; no bruits. No lymphadenopathy or thryomegaly appreciated. Cor: PMI nondisplaced. Regular rate & rhythm. No rubs, gallops or murmurs. Lungs: clear Abdomen:  soft, nontender, nondistended. No hepatosplenomegaly. No bruits or masses. Good bowel sounds. Extremities: no cyanosis, clubbing, rash, edema Neuro: alert & orientedx3, cranial nerves grossly  intact. moves all 4 extremities w/o difficulty. Affect pleasant  He has stabilized from a cardiorenal perspective. Ready for d/c. PT suggesting CIR. Will consult. BP too soft for reintroducing GDMT at this point.   INR 3.0. Discussed dosing with PharmD personally.  Arvilla Meres, MD  10:26 AM

## 2023-04-20 NOTE — Progress Notes (Signed)
Inpatient Rehab Admissions Coordinator:    CIR consult received. Current therapy recommendations are for SNF but I asked PT to see today and re-assess. Pt. States [facility] and be essentially independent with mobility at d/c. I will follow up with him after therapy notes are in.  Megan Salon, MS, CCC-SLP Rehab Admissions Coordinator  [phone] (celll) [phone] (office)

## 2023-04-21 DIAGNOSIS — I5023 Acute on chronic systolic (congestive) heart failure: Secondary | ICD-10-CM | POA: Diagnosis not present

## 2023-04-21 LAB — COOXEMETRY PANEL
Carboxyhemoglobin: 2.8 % — ABNORMAL HIGH (ref 0.5–1.5)
Methemoglobin: <0.7 % (ref 0.0–1.5)
O2 Saturation: 67.5 %
Total hemoglobin: 9.7 g/dL — ABNORMAL LOW (ref 12.0–16.0)

## 2023-04-21 LAB — BASIC METABOLIC PANEL
Anion gap: 9 (ref 5–15)
BUN: 33 mg/dL — ABNORMAL HIGH (ref 8–23)
CO2: 31 mmol/L (ref 22–32)
Calcium: 8.3 mg/dL — ABNORMAL LOW (ref 8.9–10.3)
Chloride: 93 mmol/L — ABNORMAL LOW (ref 98–111)
Creatinine, Ser: 3.05 mg/dL — ABNORMAL HIGH (ref 0.61–1.24)
GFR, Estimated: 20 mL/min — ABNORMAL LOW (ref >60)
Glucose, Bld: 129 mg/dL — ABNORMAL HIGH (ref 70–99)
Potassium: 3.5 mmol/L (ref 3.5–5.1)
Sodium: 133 mmol/L — ABNORMAL LOW (ref 135–145)

## 2023-04-21 LAB — CBC
HCT: 30.3 % — ABNORMAL LOW (ref 39.0–52.0)
Hemoglobin: 9.2 g/dL — ABNORMAL LOW (ref 13.0–17.0)
MCH: 29.5 pg (ref 26.0–34.0)
MCHC: 30.4 g/dL (ref 30.0–36.0)
MCV: 97.1 fL (ref 80.0–100.0)
Platelets: 104 10*3/uL — ABNORMAL LOW (ref 150–400)
RBC: 3.12 MIL/uL — ABNORMAL LOW (ref 4.22–5.81)
RDW: 19.1 % — ABNORMAL HIGH (ref 11.5–15.5)
WBC: 7.7 10*3/uL (ref 4.0–10.5)
nRBC: 0 % (ref 0.0–0.2)

## 2023-04-21 LAB — PROTIME-INR
INR: 3.2 — ABNORMAL HIGH (ref 0.8–1.2)
Prothrombin Time: 33.1 seconds — ABNORMAL HIGH (ref 11.4–15.2)

## 2023-04-21 MED ORDER — TORSEMIDE 20 MG PO TABS
20.0000 mg | ORAL_TABLET | Freq: Every evening | ORAL | Status: DC
  Administered 2023-04-21 – 2023-04-22 (×2): 20 mg via ORAL
  Filled 2023-04-21 (×2): qty 1

## 2023-04-21 MED ORDER — HYDROCODONE-ACETAMINOPHEN 5-325 MG PO TABS
1.0000 | ORAL_TABLET | Freq: Four times a day (QID) | ORAL | Status: DC | PRN
  Administered 2023-04-21: 1 via ORAL
  Filled 2023-04-21: qty 1

## 2023-04-21 MED ORDER — TORSEMIDE 20 MG PO TABS
40.0000 mg | ORAL_TABLET | Freq: Every morning | ORAL | Status: DC
  Administered 2023-04-22: 40 mg via ORAL
  Filled 2023-04-21: qty 2

## 2023-04-21 MED ORDER — POTASSIUM CHLORIDE CRYS ER 20 MEQ PO TBCR
20.0000 meq | EXTENDED_RELEASE_TABLET | Freq: Once | ORAL | Status: AC
  Administered 2023-04-21: 20 meq via ORAL
  Filled 2023-04-21: qty 1

## 2023-04-21 NOTE — Progress Notes (Addendum)
Patient ID: Lamaj Toepfer, male   DOB: Mar 11, 1941, 82 y.o.   MRN: [id number]     Advanced Heart Failure Rounding Note  PCP-Cardiologist: None   Subjective:   Patient was paced out of slow VT on 9/13.  Since then, has been in and out of slow VT.  Was out of VT on 9/14 but went back in slow VT 9/15 Back in slow VT for a couple of hours rate around 100.  When out of VT, he has had atrial tachycardia with intermittent RV pacing. EP saw and programmed ( RV lead only) to 100 . Switched to lasix drip and given metolazone. 9/16 Back in VT  9/17 VT Paced out. S/P Barostimulator removal.  9/19 Transitioned to PO amiodarone. Lasix gtt d/c 9/20 wound cx + pseudomonas. Cipro started. 9/20 paced out of VT   Co-ox stable  68%. CVP 8-9   A-V Paced.   INR 3.2   Denies resting dyspnea. Feels mildly dizzy w/ standing. Mild dyspnea w/ activity while working w/ PT yesterday. Asking for unna boots to come off, uncomfortable.    Objective:   Weight Range: 88.8 kg Body mass index is 25.83 kg/m.   Vital Signs:   Temp:  [97.8 F (36.6 C)-98.6 F (37 C)] 98.6 F (37 C) (09/26 0726) Pulse Rate:  [70-84] 83 (09/26 0322) Resp:  [16-24] 17 (09/26 0726) BP: (84-128)/(58-80) 105/60 (09/26 0726) SpO2:  [94 %-98 %] 97 % (09/26 0726) Weight:  [88.8 kg] 88.8 kg (09/26 0500) Last BM Date : 04/19/23  Weight change: Filed Weights   04/19/23 0500 04/20/23 0315 04/21/23 0500  Weight: 90.9 kg 88.9 kg 88.8 kg    Intake/Output:   Intake/Output Summary (Last 24 hours) at 04/21/2023 0752 Last data filed at 04/21/2023 0600 Gross per 24 hour  Intake 3 ml  Output 1590 ml  Net -1587 ml      Physical Exam   CVP 8-9  General:  Well appearing. No respiratory difficulty HEENT: normal Neck: supple. JVD not well visualized. Carotids 2+ bilat; no bruits. No lymphadenopathy or thyromegaly appreciated. Cor: PMI nondisplaced. Regular rate & rhythm. No rubs, gallops or murmurs. Lungs: bibasilar crackles   Abdomen: soft, nontender, nondistended. No hepatosplenomegaly. No bruits or masses. Good bowel sounds. Extremities: no cyanosis, clubbing, rash, trace -1+ thigh edema + LUE PICC  Neuro: alert & oriented x 3, cranial nerves grossly intact. moves all 4 extremities w/o difficulty. Affect pleasant.   Telemetry   A-V paced 80s No further VT (personally reviewed)  EKG    No new EKG to review   Labs    CBC Recent Labs    04/21/23 0525  WBC 7.7  HGB 9.2*  HCT 30.3*  MCV 97.1  PLT 104*   Basic Metabolic Panel Recent Labs    34/74/25 0515 04/21/23 0525  NA 132* 133*  K 3.5 3.5  CL 91* 93*  CO2 28 31  GLUCOSE 101* 129*  BUN 37* 33*  CREATININE 2.99* 3.05*  CALCIUM 8.6* 8.3*   Liver Function Tests No results for input(s): "AST", "ALT", "ALKPHOS", "BILITOT", "PROT", "ALBUMIN" in the last 72 hours.   No results for input(s): "LIPASE", "AMYLASE" in the last 72 hours. Cardiac Enzymes No results for input(s): "CKTOTAL", "CKMB", "CKMBINDEX", "TROPONINI" in the last 72 hours.  BNP: BNP (last 3 results) Recent Labs    02/03/23 1502 03/23/23 1045 04/06/23 1026  BNP 453.3* 352.5* 1,258.7*    ProBNP (last 3 results) Recent Labs    12/16/22  0959  PROBNP 3,692*   D-Dimer No results for input(s): "DDIMER" in the last 72 hours. Hemoglobin A1C No results for input(s): "HGBA1C" in the last 72 hours. Fasting Lipid Panel No results for input(s): "CHOL", "HDL", "LDLCALC", "TRIG", "CHOLHDL", "LDLDIRECT" in the last 72 hours. Thyroid Function Tests No results for input(s): "TSH", "T4TOTAL", "T3FREE", "THYROIDAB" in the last 72 hours.  Invalid input(s): "FREET3"  Other results:  Imaging   No results found.  Medications:   Scheduled Medications:  amiodarone  400 mg Oral BID   Chlorhexidine Gluconate Cloth  6 each Topical Daily   ciprofloxacin  500 mg Oral Q breakfast   levothyroxine  88 mcg Oral QAC breakfast   melatonin  5 mg Oral QHS   multivitamin with minerals   1 tablet Oral q AM   mupirocin ointment  1 Application Topical Daily   polyethylene glycol  17 g Oral BID   quiniDINE gluconate  324 mg Oral BID   sodium chloride flush  3 mL Intravenous Q12H   torsemide  20 mg Oral BID   warfarin  2.5 mg Oral q1600   Warfarin - Pharmacist Dosing Inpatient   Does not apply q1600    Infusions:  sodium chloride      PRN Medications: sodium chloride, acetaminophen, nitroGLYCERIN, ondansetron (ZOFRAN) IV, mouth rinse, polyethylene glycol, sodium chloride flush  Patient Profile  82 y/o male w/ 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    Now readmitted for ICD shocks for VT and a/c CHF.  Assessment/Plan  1. VT: History of VT and VT ablation in 2017.  MDT ICD.  Redo VT ablation in 1/23 (Dr. Marquette Saa).  He is on amiodarone, this was increased to 200 mg daily as outpatient with recurrent VT.  He has tolerated amiodarone poorly historically but is doing ok with it today.  In 7/23, slow VT was pace-terminated in the office, then he had an ICD shock for VT later in 7/23.  He was started on quinidine after this.  He is followed by Dr. Ladona Ridgel.  Now with 2 further ICD shocks for VT prior to this admission, had been in slow VT rate 100s-110s this admission.  Paced out of VT 9/13 by EP, since then has been in and out of slow VT.  At baseline, he RV paces most of the time. EP decreased pacing rate to 85 bpm. He has remained out of VT over the last 4 days. Now on PO amio, stable.  - Continue PO amio 400 mg bid   - Continue quinidine.  2. Acute on chronic systolic CHF: Long-standing, suspected nonischemic cardiomyopathy.  Had RHC on 02/26/21 with normal filling pressures and mildly low cardiac output.  Echo in 4/23 showed   EF 20-25% with diffuse hypokinesis and septal-lateral dyssynchrony, mildly decreased RV systolic function, mechanical MV with normal function, dilated IVC.  He has a [employer] CRT-D device present, but the LV lead is not functional.  He has complete heart block, so has been RV paced chronically with a wide QRS.  Has had difficulty tolerating GDMT historically with CKD and hypotension.  Currently off his home GDMT due to low BP and elevated creatinine.  Echo this admission with EF 20%, mechanical MV mean gradient 8 mmHg.  Co-ox 68% today. Back  on PO diuretics. CVP trending up 8-9 today, lung exam w/ + bibasilar crackles. Creatinine overall improved, 3.8 => 3.62>3.49>3.88>3.46>2.99>>3.05 today, UOP decent. - Increase Torsemide to 40 mg qam and 20 mg qpm  - He has advanced biventricular HF but now stabilized off of milrinone.  - Off Spironolactone, valsartan, and Toprol XL with low SBP and AKI.  - Off digoxin due to persistently elevated level.  - He did not tolerate SGLT2 inhibitor in the past.  - The patient has CHB and has been chronically RV pacing as his LV lead is nonfunctional.  This is not ideal and creates dyssynchrony with a very wide QRS.  Left bundle lead placement discussed in past with Dr. Ladona Ridgel but thought risk would be too high and left bundle lead was not attempted.   - Barostimulator device placed but complicated by chest wall hematoma as below and has been removed.  3. Atrial fibrillation/flutter, atrial tachycardia: Paroxysmal.  Earlier this admission, when out of VT, he has been in atrial tachycardia. Has been stable v-pacing.  - Continue amiodarone - Continue warfarin.  4. Complete heart block: Patient is pacer dependent due to CHB.  - See above, upgrade to functional CRT thought to be too risky due to need to extract old CS lead.  5. OSA: Severe. Continue CPAP 6. Mechanical mitral valve: Mean gradient higher on echo this admission at 8 mmHg. INR 3.2.  - Continue warfarin INR goal  2.5-3.5.  7. Chest Wall Hematoma: post barostimulator complication, in setting of coumadin for mechanical MV.  s/p I&D for evacuation of hematoma and placement of wound vac + placement of antibiotic beads on 8/30. Barostimulator device removed 9/17, still has drain in place. Wound cx with Pseudomonas.  - ID has seen, plan for 10 day course of Ciprofloxacin.  - vascular following.   8. AKI on CKD stage 3: Creatinine baseline 1.5-2.  2.7=>2.38 =>2.68 => 3=>3.50=> 3.8 => 3.62>3.49>3.88>3.46>2.99>3.05 today.  - see plan as above - Not candidate for HD given age and end-stage HF. May be headed toward Palliative Care pathway 9. Deconditioning: PT recommending SNF. TOC consulted for placement.   Length of Stay: [address], PA-C  04/21/2023, 7:52 AM  Advanced Heart Failure Team Pager [phone] (M-F; 7a - 5p)  Please contact [facility] for night-coverage after hours (5p -7a ) and weekends on [email or web address] . Patient seen and examined with the above-signed Advanced Practice Provider and/or Housestaff. I personally reviewed laboratory data, imaging studies and relevant notes. I independently examined the patient and formulated the important aspects of the plan. I have edited the note to reflect any of my changes or salient points. I have personally discussed the plan with the patient and/or family.  Feels better. Able to walk halls. Renal function stable. Chest wound ok   General:  Sitting up in bed  No resp difficulty HEENT: normal Neck: supple. JVP 7 Carotids 2+ bilat; no bruits. No lymphadenopathy or thryomegaly appreciated. Cor: PMI nondisplaced. Irregular rate & rhythm. 2/6 TR Lungs: clear Abdomen: soft, nontender, nondistended. No hepatosplenomegaly. No bruits or masses. Good bowel sounds. Extremities: no cyanosis, clubbing, rash, tr edema Neuro: alert & orientedx3, cranial nerves grossly intact. moves all 4 extremities w/o difficulty. Affect pleasant  He has stabilized. [place] for  d/c from HF perspective. PT recommending SNF. He currently lives in [facility]. Can he go to skilled unit there? Will d/w SW.   INR therapeutic. Discussed dosing with PharmD personally.  Arvilla Meres, MD  3:39 PM

## 2023-04-21 NOTE — Progress Notes (Signed)
Orthopedic Tech Progress Note Patient Details:  Austin [name] 05-Oct-1940 [id number]  Patient ID: Austin [name], male   DOB: [birth year], 82 y.o.   MRN: [id number] Per nursing pt no longer needs unna boots and will be using TEDS  Prisila Dlouhy OTR/L 04/21/2023, 3:25 PM

## 2023-04-21 NOTE — Progress Notes (Signed)
Occupational Therapy Treatment Patient Details Name: Austin [name] MRN: [id number] DOB: 1941/03/01 Today's Date: 04/21/2023   History of present illness Pt is an 82 y.o. male who presented 04/06/23 with ICD shocks. Of note, pt underwent Barostim device 03/17/23. S/p removal of L sided Barostim device and redo L carotid artery exposure 9/17. PMH: Mechanical MV s/p MV endocarditis, VT, NICM, CHB and PAF/AFL, CKD, CHF, syncope, sleep apnea   OT comments  Continues to progress toward patient focused goals.  Biggest deficit is lower body weakness and poor activity tolerance.  OT to continue efforts in the acute setting and Patient will benefit from continued inpatient follow up therapy, <3 hours/day       If plan is discharge home, recommend the following:  A little help with bathing/dressing/bathroom;A little help with walking and/or transfers;Assist for transportation;Assistance with cooking/housework   Equipment Recommendations  None recommended by OT    Recommendations for Other Services      Precautions / Restrictions Precautions Precautions: Fall Precaution Comments: JP drain L chest; watch BP Restrictions Weight Bearing Restrictions: No       Mobility Bed Mobility Overal bed mobility: Needs Assistance       Supine to sit: Min assist, HOB elevated          Transfers Overall transfer level: Needs assistance Equipment used: Rolling walker (2 wheels) Transfers: Sit to/from Stand, Bed to chair/wheelchair/BSC Sit to Stand: Supervision, From elevated surface     Step pivot transfers: Contact guard assist           Balance Overall balance assessment: Needs assistance Sitting-balance support: Feet supported Sitting balance-Leahy Scale: Good     Standing balance support: Bilateral upper extremity supported, Reliant on assistive device for balance Standing balance-Leahy Scale: Poor                             ADL either performed or assessed  with clinical judgement   ADL       Grooming: Contact guard assist;Standing           Upper Body Dressing : Contact guard assist;Sitting   Lower Body Dressing: Minimal assistance;Sit to/from stand   Toilet Transfer: Contact guard assist;Rolling walker (2 wheels);Ambulation;Regular Toilet                  Extremity/Trunk Assessment Upper Extremity Assessment Upper Extremity Assessment: Overall WFL for tasks assessed   Lower Extremity Assessment Lower Extremity Assessment: Defer to PT evaluation   Cervical / Trunk Assessment Cervical / Trunk Assessment: Normal    Vision Patient Visual Report: No change from baseline     Perception Perception Perception: Not tested   Praxis Praxis Praxis: Not tested    Cognition Arousal: Alert Behavior During Therapy: WFL for tasks assessed/performed Overall Cognitive Status: Within Functional Limits for tasks assessed                                                             Pertinent Vitals/ Pain       Pain Assessment Pain Assessment: No/denies pain  Frequency  Min 1X/week        Progress Toward Goals  OT Goals(current goals can now be found in the care plan section)  Progress towards OT goals: Progressing toward goals  Acute Rehab OT Goals OT Goal Formulation: With patient Time For Goal Achievement: 04/28/23 Potential to Achieve Goals: Good  Plan      Co-evaluation                 AM-PAC OT "6 Clicks" Daily Activity     Outcome Measure   Help from another person eating meals?: None Help from another person taking care of personal grooming?: A Little Help from another person toileting, which includes using toliet, bedpan, or urinal?: A Little Help from another person bathing (including washing, rinsing, drying)?: A Little Help from another person to put on and taking off regular upper  body clothing?: None Help from another person to put on and taking off regular lower body clothing?: A Little 6 Click Score: 20    End of Session Equipment Utilized During Treatment: Rolling walker (2 wheels)  OT Visit Diagnosis: Unsteadiness on feet (R26.81);Muscle weakness (generalized) (M62.81)   Activity Tolerance Patient tolerated treatment well   Patient Left in chair;with call bell/phone within reach   Nurse Communication Mobility status        Time: 1027-2536 OT Time Calculation (min): 22 min  Charges: OT General Charges $OT Visit: 1 Visit OT Treatments $Self Care/Home Management : 8-22 mins  04/21/2023  RP, OTR/L  Acute Rehabilitation Services  Office:  [phone]   Austin [name] 04/21/2023, 9:18 AM

## 2023-04-21 NOTE — Progress Notes (Signed)
Mobility Specialist Progress Note:   04/21/23 1020  Therapy Vitals  Temp 98.7 F (37.1 C)  Temp Source Oral  Resp 17  BP 99/60  Patient Position (if appropriate) Sitting  Oxygen Therapy  SpO2 97 %  Mobility  Activity Ambulated with assistance in hallway  Level of Assistance Contact guard assist, steadying assist  Assistive Device Four wheel walker  Distance Ambulated (ft) 340 ft  Activity Response Tolerated well  Mobility Referral Yes  $Mobility charge 1 Mobility  Mobility Specialist Start Time (ACUTE ONLY) 1014  Mobility Specialist Stop Time (ACUTE ONLY) 1029  Mobility Specialist Time Calculation (min) (ACUTE ONLY) 15 min    Pre Mobility: 85 HR During Mobility: 85 HR Post Mobility:  84 HR  Pt received in chair, agreeable to mobility. C/o legs feeling uncomfortable and wanting to take off leg wraps. Asymptomatic throughout session. Pt left in chair with call bell and all needs met.  D'Vante Earlene Plater Mobility Specialist Please contact via Special educational needs teacher or Rehab office at [phone]

## 2023-04-21 NOTE — Progress Notes (Signed)
ANTICOAGULATION CONSULT NOTE - Follow Up  Pharmacy Consult for heparin > warfarin  Indication: atrial fibrillation and mechanical mitral valve   No Known Allergies  Patient Measurements: Height: 6\' 1"  (185.4 cm) Weight: 88.8 kg (195 lb 12.3 oz) IBW/kg (Calculated) : 79.9 Heparin Dosing Weight: 94.3 kg   Vital Signs: Temp: 98.7 F (37.1 C) (09/26 1020) Temp Source: Oral (09/26 1020) BP: 99/60 (09/26 1020) Pulse Rate: 83 (09/26 0322)  Labs: Recent Labs    04/19/23 0330 04/20/23 0515 04/21/23 0525  HGB  --   --  9.2*  HCT  --   --  30.3*  PLT  --   --  104*  LABPROT 28.0* 31.4* 33.1*  INR 2.6* 3.0* 3.2*  CREATININE 3.46* 2.99* 3.05*    Estimated Creatinine Clearance: 21.8 mL/min (A) (by C-G formula based on SCr of 3.05 mg/dL (H)).   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   Conductive hearing loss    uses hearing aids   Dysrhythmia    A-fib   History of blood transfusion 03/24/2023   Hypothyroidism    Intraspinal abscess    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   Nonischemic cardiomyopathy (HCC)    Psychosexual dysfunction with inhibited sexual excitement    S/P mitral valve replacement    Syncope and collapse    Unspecified sleep apnea    last sleep study 11/07, uses cpap   Ventricular tachycardia (HCC)    VT (ventricular tachycardia) (HCC)     Medications:  Scheduled:   amiodarone  400 mg Oral BID   Chlorhexidine Gluconate Cloth  6 each Topical Daily   ciprofloxacin  500 mg Oral Q breakfast   levothyroxine  88 mcg Oral QAC breakfast   melatonin  5 mg Oral QHS   multivitamin with minerals  1 tablet Oral q AM    mupirocin ointment  1 Application Topical Daily   polyethylene glycol  17 g Oral BID   quiniDINE gluconate  324 mg Oral BID   sodium chloride flush  3 mL Intravenous Q12H   [START ON 04/22/2023] torsemide  40 mg Oral q AM   And   torsemide  20 mg Oral QPM   warfarin  2.5 mg Oral q1600   Warfarin - Pharmacist Dosing Inpatient   Does not apply q1600    Assessment: 82 yom presenting after ICD shocks - on enoxaparin and warfarin PTA (LD 9/10 PM). PTA regimen 2.5 mg TTSS and  5 mg MWF. S/P undergoing bridge anticoagulation given recent admission for Barostim implant complicated with L chest wall hematoma requiring I&D > barostim removed 9/17 and vac removed.  INR now up to 3.2 from 3 (therapeutic). CBC stable - Hgb 9.2, plt 104. Also on cipro for pseudomonas wound infection - can increase INR.   PTA warfarin 2.5mg  TTSS / 5mg  MWF  Goal of Therapy:  INR 3-3.5   Monitor platelets by anticoagulation protocol: Yes   Plan:  Continue warfarin 2.5 mg tonight  Daily INR, CBC q72 hr  Thank you for allowing pharmacy to participate in this patient's care,  Sherron Monday, PharmD, BCCCP Clinical Pharmacist  Phone: [phone] 04/21/2023 10:50 AM  Please check AMION for all [facility] phone numbers After 10:00 PM, call Main Pharmacy [phone]

## 2023-04-21 NOTE — Plan of Care (Signed)
Problem: Activity: Goal: Risk for activity intolerance will decrease Outcome: Progressing   Problem: Elimination: Goal: Will not experience complications related to bowel motility Outcome: Progressing Goal: Will not experience complications related to urinary retention Outcome: Progressing   Problem: Pain Managment: Goal: General experience of comfort will improve Outcome: Progressing

## 2023-04-21 NOTE — Progress Notes (Addendum)
  Progress Note    04/21/2023 7:57 AM 9 Days Post-Op  Subjective:  no complaints. Had a little bit of "jabbing" pain around the left chest drain site yesterday    Vitals:   04/21/23 0500 04/21/23 0726  BP:  105/60  Pulse:    Resp: 16 17  Temp:  98.6 F (37 C)  SpO2:  97%    Physical Exam: General:  sitting up in bed eating breakfast Lungs:  nonlabored Incisions:  L chest and neck incisions dry and intact. Left chest JP with 40cc output  CBC    Component Value Date/Time   WBC 7.7 04/21/2023 0525   RBC 3.12 (L) 04/21/2023 0525   HGB 9.2 (L) 04/21/2023 0525   HGB 16.6 12/04/2020 1445   HCT 30.3 (L) 04/21/2023 0525   HCT 50.1 12/04/2020 1445   PLT 104 (L) 04/21/2023 0525   PLT 158 12/04/2020 1445   MCV 97.1 04/21/2023 0525   MCV 94 12/04/2020 1445   MCH 29.5 04/21/2023 0525   MCHC 30.4 04/21/2023 0525   RDW 19.1 (H) 04/21/2023 0525   RDW 13.7 12/04/2020 1445   LYMPHSABS 0.3 (L) 04/06/2023 1030   MONOABS 1.1 (H) 04/06/2023 1030   EOSABS 0.1 04/06/2023 1030   BASOSABS 0.1 04/06/2023 1030    BMET    Component Value Date/Time   NA 133 (L) 04/21/2023 0525   NA 139 01/29/2021 0853   K 3.5 04/21/2023 0525   CL 93 (L) 04/21/2023 0525   CO2 31 04/21/2023 0525   GLUCOSE 129 (H) 04/21/2023 0525   BUN 33 (H) 04/21/2023 0525   BUN 18 01/29/2021 0853   CREATININE 3.05 (H) 04/21/2023 0525   CREATININE 1.22 (H) 09/12/2015 0846   CALCIUM 8.3 (L) 04/21/2023 0525   GFRNONAA 20 (L) 04/21/2023 0525   GFRAA 56 (L) 04/30/2020 1144    INR    Component Value Date/Time   INR 3.2 (H) 04/21/2023 0525     Intake/Output Summary (Last 24 hours) at 04/21/2023 0757 Last data filed at 04/21/2023 0600 Gross per 24 hour  Intake 3 ml  Output 1590 ml  Net -1587 ml      Assessment/Plan:  82 y.o. male is 9 days post op, s/p: barostim removal  -Feeling fine this morning. Had some pain around the drain exit site yesterday but this went away -Both incisions are well appearing  without drainage -L chest JP drain with 40cc output in 24H, continue to suction   [name], PA-C [facility] [phone] 04/21/2023 7:57 AM  I agree with the above.  Have seen evaluate the patient.  He has had some increase in his drain output.  We will keep it to suction.  Durene Cal

## 2023-04-21 NOTE — Progress Notes (Signed)
Mobility Specialist Progress Note:   04/21/23 1500  Mobility  Activity Ambulated with assistance in hallway  Level of Assistance Standby assist, set-up cues, supervision of patient - no hands on  Assistive Device Four wheel walker  Distance Ambulated (ft) 340 ft  Activity Response Tolerated well  Mobility Referral Yes  $Mobility charge 1 Mobility  Mobility Specialist Start Time (ACUTE ONLY) 1503  Mobility Specialist Stop Time (ACUTE ONLY) 1516  Mobility Specialist Time Calculation (min) (ACUTE ONLY) 13 min    Pre Mobility: 84 HR During Mobility: 93 HR Post Mobility:  88 HR  Pt received in bed, eager to mobility. C/o BLE weakness. Otherwise asymptomatic and returned to room w/o fault. Pt left in bed with call bell and family present.  D'Vante Earlene Plater Mobility Specialist Please contact via Special educational needs teacher or Rehab office at [phone]

## 2023-04-21 NOTE — TOC Progression Note (Signed)
Transition of Care [facility]) - Progression Note    Patient Details  Name: Austin [name] MRN: [id number] Date of Birth: Feb 13, 1941  [facility]) CM/SW Contact  Nicanor Bake Phone Number: [phone] 04/21/2023, 11:28 AM  Clinical Narrative:   HF CSW called [facility] admissions and left a VM asking to be called back. PT lives at [facility], but CSW called to inquire to see if there is a bed available on the SNF side for pt. CSW will be updated once [facility] calls back.   TOC will continue following.     Expected Discharge Plan: Home w Home Health Services Barriers to Discharge: Continued Medical Work up  Expected Discharge Plan and Services     Post Acute Care Choice: Home Health Living arrangements for the past 2 months: Independent Living Facility                                       Social Determinants of Health (SDOH) Interventions SDOH Screenings   Food Insecurity: No Food Insecurity (04/06/2023)  Housing: Low Risk  (04/06/2023)  Transportation Needs: No Transportation Needs (04/06/2023)  Utilities: Not At Risk (04/06/2023)  Alcohol Screen: Low Risk  (03/08/2023)  Depression (PHQ2-9): Low Risk  (03/08/2023)  Financial Resource Strain: Low Risk  (03/08/2023)  Physical Activity: Insufficiently Active (03/08/2023)  Social Connections: Moderately Integrated (03/08/2023)  Stress: No Stress Concern Present (03/08/2023)  Tobacco Use: Low Risk  (04/06/2023)  Health Literacy: Adequate Health Literacy (03/08/2023)    Readmission Risk Interventions     No data to display

## 2023-04-21 NOTE — [place] FL2 (Signed)
[place] MEDICAID FL2 LEVEL OF CARE FORM     IDENTIFICATION  Patient Name: Austin [name] Birthdate: [birth year] Sex: male Admission Date (Current Location): 04/06/2023  [facility] and [place] Number:  Producer, television/film/video and Address:  [facility] [place], [address] [place], [place] [address]      Provider Number: [id number]  Attending Physician Name and Address:  Laurey Morale, MD  Relative Name and Phone Number:  Tayen Beardall, daughter   [phone]    Current Level of Care: SNF Recommended Level of Care: Skilled Nursing Facility Prior Approval Number:    Date Approved/Denied:   PASRR Number: [id number]  Discharge Plan: SNF    Current Diagnoses: Patient Active Problem List   Diagnosis Date Noted   Heart failure (HCC) 04/06/2023   Fluid collection at surgical site, initial encounter 03/23/2023   Callous ulcer, limited to breakdown of skin (HCC) 09/27/2022   OSA on CPAP 09/27/2022   Dyspnea 07/05/2022   Anemia due to zinc deficiency 05/25/2022   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   Stage 3b chronic kidney disease (HCC) 05/17/2022   Need for immunization against influenza 05/17/2022   Chronic anticoagulation 10/14/2018   Ventricular tachycardia (HCC) 10/12/2018   Neuropathy 08/25/2018   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   Nonischemic cardiomyopathy (HCC)    Subtherapeutic international normalized ratio (INR) 01/10/2016   S/P ablation of ventricular arrhythmia 01/10/2016   H/O mitral valve replacement with mechanical valve    Encounter for therapeutic drug monitoring 08/20/2013   Cardiac device, implant, or graft infection or inflammation (HCC) 06/28/2011   ICD (implantable cardioverter-defibrillator) in place 06/02/2009   Acute on chronic systolic heart failure (HCC) 04/15/2009   ERECTILE DYSFUNCTION 11/27/2008   HEARING LOSS 11/27/2008   Atrial fibrillation (HCC)  11/27/2008   SLEEP APNEA 11/27/2008    Orientation RESPIRATION BLADDER Height & Weight     Self, Time, Situation, Place  O2 (2L) Incontinent Weight: 195 lb 12.3 oz (88.8 kg) Height:  6\' 1"  (185.4 cm)  BEHAVIORAL SYMPTOMS/MOOD NEUROLOGICAL BOWEL NUTRITION STATUS      Incontinent Diet (see dc summary)  AMBULATORY STATUS COMMUNICATION OF NEEDS Skin     Verbally Normal                       Personal Care Assistance Level of Assistance              Functional Limitations Info  Hearing, Sight Sight Info: Impaired Hearing Info: Impaired      SPECIAL CARE FACTORS FREQUENCY  OT (By licensed OT), PT (By licensed PT)     PT Frequency: 5x week OT Frequency: 5x week            Contractures      Additional Factors Info  Code Status, Allergies, Isolation Precautions Code Status Info: Full Allergies Info: NKA     Isolation Precautions Info: Low risk PPE precaution     Current Medications (04/21/2023):  This is the current hospital active medication list Current Facility-Administered Medications  Medication Dose Route Frequency Provider Last Rate Last Admin   0.9 %  sodium chloride infusion  250 mL Intravenous PRN Bensimhon, Bevelyn Buckles, MD       acetaminophen (TYLENOL) tablet 1,000 mg  1,000 mg Oral Q6H PRN Bensimhon, Bevelyn Buckles, MD   1,000 mg at 04/20/23 2223   amiodarone (PACERONE) tablet 400 mg  400 mg Oral BID Bensimhon, Bevelyn Buckles,  MD   400 mg at 04/21/23 4098   Chlorhexidine Gluconate Cloth 2 % PADS 6 each  6 each Topical Daily Bensimhon, Bevelyn Buckles, MD   6 each at 04/21/23 1191   ciprofloxacin (CIPRO) tablet 500 mg  500 mg Oral Q breakfast Bensimhon, Bevelyn Buckles, MD   500 mg at 04/21/23 0900   levothyroxine (SYNTHROID) tablet 88 mcg  88 mcg Oral QAC breakfast Bensimhon, Bevelyn Buckles, MD   88 mcg at 04/21/23 4782   melatonin tablet 5 mg  5 mg Oral QHS Bensimhon, Bevelyn Buckles, MD   5 mg at 04/20/23 2213   multivitamin with minerals tablet 1 tablet  1 tablet Oral q AM Bensimhon,  Bevelyn Buckles, MD   1 tablet at 04/21/23 9562   mupirocin ointment (BACTROBAN) 2 % 1 Application  1 Application Topical Daily Bensimhon, Bevelyn Buckles, MD   1 Application at 04/21/23 [phone]   nitroGLYCERIN (NITROSTAT) SL tablet 0.4 mg  0.4 mg Sublingual Q5 Min x 3 PRN Bensimhon, Bevelyn Buckles, MD       ondansetron [facility]) injection 4 mg  4 mg Intravenous Q6H PRN Bensimhon, Bevelyn Buckles, MD   4 mg at 04/12/23 0810   Oral care mouth rinse  15 mL Mouth Rinse PRN Bensimhon, Bevelyn Buckles, MD       polyethylene glycol (MIRALAX / GLYCOLAX) packet 17 g  17 g Oral Daily PRN Bensimhon, Bevelyn Buckles, MD   17 g at 04/18/23 1520   polyethylene glycol (MIRALAX / GLYCOLAX) packet 17 g  17 g Oral BID Bensimhon, Bevelyn Buckles, MD   17 g at 04/21/23 0951   quiniDINE gluconate CR tablet 324 mg  324 mg Oral BID Bensimhon, Bevelyn Buckles, MD   324 mg at 04/21/23 0951   sodium chloride flush (NS) 0.9 % injection 3 mL  3 mL Intravenous Q12H Bensimhon, Bevelyn Buckles, MD   3 mL at 04/21/23 0952   sodium chloride flush (NS) 0.9 % injection 3 mL  3 mL Intravenous PRN Bensimhon, Bevelyn Buckles, MD       [START ON 04/22/2023] torsemide (DEMADEX) tablet 40 mg  40 mg Oral q AM Robbie Lis M, PA-C       And   torsemide [facility]) tablet 20 mg  20 mg Oral QPM Simmons, Brittainy M, PA-C       warfarin (COUMADIN) tablet 2.5 mg  2.5 mg Oral q1600 Bensimhon, Bevelyn Buckles, MD   2.5 mg at 04/20/23 1646   Warfarin - Pharmacist Dosing Inpatient   Does not apply q1600 Bensimhon, Bevelyn Buckles, MD   Given at 04/20/23 1648     Discharge Medications: Please see discharge summary for a list of discharge medications.  Relevant Imaging Results:  Relevant Lab Results:   Additional Information SS# [id number]  Reva Bores, [place]

## 2023-04-22 DIAGNOSIS — G4709 Other insomnia: Secondary | ICD-10-CM | POA: Diagnosis not present

## 2023-04-22 DIAGNOSIS — Z952 Presence of prosthetic heart valve: Secondary | ICD-10-CM | POA: Diagnosis not present

## 2023-04-22 DIAGNOSIS — I509 Heart failure, unspecified: Secondary | ICD-10-CM | POA: Diagnosis not present

## 2023-04-22 DIAGNOSIS — I97648 Postprocedural seroma of a circulatory system organ or structure following other circulatory system procedure: Secondary | ICD-10-CM | POA: Diagnosis not present

## 2023-04-22 DIAGNOSIS — I5043 Acute on chronic combined systolic (congestive) and diastolic (congestive) heart failure: Secondary | ICD-10-CM | POA: Diagnosis not present

## 2023-04-22 DIAGNOSIS — E032 Hypothyroidism due to medicaments and other exogenous substances: Secondary | ICD-10-CM | POA: Diagnosis not present

## 2023-04-22 DIAGNOSIS — G4733 Obstructive sleep apnea (adult) (pediatric): Secondary | ICD-10-CM | POA: Diagnosis not present

## 2023-04-22 DIAGNOSIS — Z5181 Encounter for therapeutic drug level monitoring: Secondary | ICD-10-CM | POA: Diagnosis not present

## 2023-04-22 DIAGNOSIS — S21109D Unspecified open wound of unspecified front wall of thorax without penetration into thoracic cavity, subsequent encounter: Secondary | ICD-10-CM | POA: Diagnosis not present

## 2023-04-22 DIAGNOSIS — N1832 Chronic kidney disease, stage 3b: Secondary | ICD-10-CM | POA: Diagnosis not present

## 2023-04-22 DIAGNOSIS — R296 Repeated falls: Secondary | ICD-10-CM | POA: Diagnosis not present

## 2023-04-22 DIAGNOSIS — I472 Ventricular tachycardia, unspecified: Secondary | ICD-10-CM | POA: Diagnosis not present

## 2023-04-22 DIAGNOSIS — F5101 Primary insomnia: Secondary | ICD-10-CM | POA: Diagnosis not present

## 2023-04-22 DIAGNOSIS — E876 Hypokalemia: Secondary | ICD-10-CM | POA: Diagnosis not present

## 2023-04-22 DIAGNOSIS — Z954 Presence of other heart-valve replacement: Secondary | ICD-10-CM | POA: Diagnosis not present

## 2023-04-22 DIAGNOSIS — E871 Hypo-osmolality and hyponatremia: Secondary | ICD-10-CM | POA: Diagnosis not present

## 2023-04-22 DIAGNOSIS — I5023 Acute on chronic systolic (congestive) heart failure: Secondary | ICD-10-CM | POA: Diagnosis not present

## 2023-04-22 DIAGNOSIS — Z7901 Long term (current) use of anticoagulants: Secondary | ICD-10-CM | POA: Diagnosis not present

## 2023-04-22 DIAGNOSIS — L7632 Postprocedural hematoma of skin and subcutaneous tissue following other procedure: Secondary | ICD-10-CM | POA: Diagnosis not present

## 2023-04-22 DIAGNOSIS — R2681 Unsteadiness on feet: Secondary | ICD-10-CM | POA: Diagnosis not present

## 2023-04-22 DIAGNOSIS — I480 Paroxysmal atrial fibrillation: Secondary | ICD-10-CM | POA: Diagnosis not present

## 2023-04-22 DIAGNOSIS — E039 Hypothyroidism, unspecified: Secondary | ICD-10-CM | POA: Diagnosis not present

## 2023-04-22 DIAGNOSIS — R079 Chest pain, unspecified: Secondary | ICD-10-CM | POA: Diagnosis not present

## 2023-04-22 DIAGNOSIS — N184 Chronic kidney disease, stage 4 (severe): Secondary | ICD-10-CM | POA: Diagnosis not present

## 2023-04-22 DIAGNOSIS — Z95810 Presence of automatic (implantable) cardiac defibrillator: Secondary | ICD-10-CM | POA: Diagnosis not present

## 2023-04-22 DIAGNOSIS — Z7401 Bed confinement status: Secondary | ICD-10-CM | POA: Diagnosis not present

## 2023-04-22 DIAGNOSIS — R54 Age-related physical debility: Secondary | ICD-10-CM | POA: Diagnosis not present

## 2023-04-22 DIAGNOSIS — T827XXS Infection and inflammatory reaction due to other cardiac and vascular devices, implants and grafts, sequela: Secondary | ICD-10-CM | POA: Diagnosis not present

## 2023-04-22 DIAGNOSIS — N189 Chronic kidney disease, unspecified: Secondary | ICD-10-CM | POA: Diagnosis not present

## 2023-04-22 DIAGNOSIS — I4891 Unspecified atrial fibrillation: Secondary | ICD-10-CM | POA: Diagnosis not present

## 2023-04-22 DIAGNOSIS — D631 Anemia in chronic kidney disease: Secondary | ICD-10-CM | POA: Diagnosis not present

## 2023-04-22 LAB — COOXEMETRY PANEL
Carboxyhemoglobin: 2.3 % — ABNORMAL HIGH (ref 0.5–1.5)
Methemoglobin: <0.7 % (ref 0.0–1.5)
O2 Saturation: 72.1 %
Total hemoglobin: 9.4 g/dL — ABNORMAL LOW (ref 12.0–16.0)

## 2023-04-22 LAB — BASIC METABOLIC PANEL
Anion gap: 7 (ref 5–15)
BUN: 32 mg/dL — ABNORMAL HIGH (ref 8–23)
CO2: 31 mmol/L (ref 22–32)
Calcium: 7.9 mg/dL — ABNORMAL LOW (ref 8.9–10.3)
Chloride: 94 mmol/L — ABNORMAL LOW (ref 98–111)
Creatinine, Ser: 3.12 mg/dL — ABNORMAL HIGH (ref 0.61–1.24)
GFR, Estimated: 19 mL/min — ABNORMAL LOW (ref >60)
Glucose, Bld: 90 mg/dL (ref 70–99)
Potassium: 3.4 mmol/L — ABNORMAL LOW (ref 3.5–5.1)
Sodium: 132 mmol/L — ABNORMAL LOW (ref 135–145)

## 2023-04-22 LAB — PROTIME-INR
INR: 3.4 — ABNORMAL HIGH (ref 0.8–1.2)
Prothrombin Time: 34.4 s — ABNORMAL HIGH (ref 11.4–15.2)

## 2023-04-22 MED ORDER — TORSEMIDE 20 MG PO TABS: 40.0000 mg | ORAL_TABLET | Freq: Two times a day (BID) | ORAL | Status: DC

## 2023-04-22 MED ORDER — AMIODARONE HCL 200 MG PO TABS: ORAL_TABLET | ORAL | Status: DC

## 2023-04-22 MED ORDER — TORSEMIDE 20 MG PO TABS: ORAL_TABLET | ORAL | Status: DC

## 2023-04-22 MED ORDER — WARFARIN SODIUM 2.5 MG PO TABS: 2.5000 mg | ORAL_TABLET | Freq: Every day | ORAL | Status: DC

## 2023-04-22 MED ORDER — CIPROFLOXACIN HCL 500 MG PO TABS: 500.0000 mg | ORAL_TABLET | Freq: Every day | ORAL | 0 refills | Status: DC

## 2023-04-22 NOTE — TOC Progression Note (Addendum)
Transition of Care [facility]) - Progression Note    Patient Details  Name: Austin [name] MRN: [id number] Date of Birth: [birth year]  [facility]) CM/SW Contact  Nicanor Bake Phone Number: [phone] 04/22/2023, 1:12 PM  Clinical Narrative:   Pts Berkley Harvey is pending. [facility] has a bed available. CSW called to inquire if the facility accepts weekend admissions. Waiting for Amber [phone]) to call back with updates.   TOC will continue following.     Expected Discharge Plan: Home w Home Health Services Barriers to Discharge: Continued Medical Work up  Expected Discharge Plan and Services     Post Acute Care Choice: Home Health Living arrangements for the past 2 months: Independent Living Facility                                       Social Determinants of Health (SDOH) Interventions SDOH Screenings   Food Insecurity: No Food Insecurity (04/06/2023)  Housing: Low Risk  (04/06/2023)  Transportation Needs: No Transportation Needs (04/06/2023)  Utilities: Not At Risk (04/06/2023)  Alcohol Screen: Low Risk  (03/08/2023)  Depression (PHQ2-9): Low Risk  (03/08/2023)  Financial Resource Strain: Low Risk  (03/08/2023)  Physical Activity: Insufficiently Active (03/08/2023)  Social Connections: Moderately Integrated (03/08/2023)  Stress: No Stress Concern Present (03/08/2023)  Tobacco Use: Low Risk  (04/06/2023)  Health Literacy: Adequate Health Literacy (03/08/2023)    Readmission Risk Interventions     No data to display

## 2023-04-22 NOTE — Progress Notes (Signed)
JP drain not holding charge. PA notified. Instructed to apply a Vaseline gauze at the insertion site and redress. Vaseline guaze and dressing was applied but the drain did not hold its charge. MD was notified and came to bedside. MD pulled JP drain. MD gave verbal orders to change dressing daily and to have [facility] follow up with the office on Monday. Pt tolerated well.

## 2023-04-22 NOTE — Progress Notes (Signed)
Received report from Udell, RN at this time.

## 2023-04-22 NOTE — Progress Notes (Signed)
Inpatient Rehab Admissions Coordinator:    Note TOC trying to Place Pt. At [facility]. CIR following at a distance  Megan Salon, MS, CCC-SLP Rehab Admissions Coordinator  [phone] (celll) [phone] (office)

## 2023-04-22 NOTE — Plan of Care (Signed)

## 2023-04-22 NOTE — Progress Notes (Addendum)
ANTICOAGULATION CONSULT NOTE - Follow Up  Pharmacy Consult for heparin > warfarin  Indication: atrial fibrillation and mechanical mitral valve   No Known Allergies  Patient Measurements: Height: 6\' 1"  (185.4 cm) Weight: 88.2 kg (194 lb 7.1 oz) IBW/kg (Calculated) : 79.9 Heparin Dosing Weight: 94.3 kg   Vital Signs: Temp: 97.9 F (36.6 C) (09/27 0811) Temp Source: Oral (09/27 0811) BP: 98/66 (09/27 0732) Pulse Rate: 83 (09/27 0732)  Labs: Recent Labs    04/20/23 0515 04/21/23 0525 04/22/23 0540  HGB  --  9.2*  --   HCT  --  30.3*  --   PLT  --  104*  --   LABPROT 31.4* 33.1* 34.4*  INR 3.0* 3.2* 3.4*  CREATININE 2.99* 3.05* 3.12*    Estimated Creatinine Clearance: 21.3 mL/min (A) (by C-G formula based on SCr of 3.12 mg/dL (H)).   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   Conductive hearing loss    uses hearing aids   Dysrhythmia    A-fib   History of blood transfusion 03/24/2023   Hypothyroidism    Intraspinal abscess    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   Nonischemic cardiomyopathy (HCC)    Psychosexual dysfunction with inhibited sexual excitement    S/P mitral valve replacement    Syncope and collapse    Unspecified sleep apnea    last sleep study 11/07, uses cpap   Ventricular tachycardia (HCC)    VT (ventricular tachycardia) (HCC)     Medications:  Scheduled:   amiodarone  400 mg Oral BID   Chlorhexidine Gluconate Cloth  6 each Topical Daily   ciprofloxacin  500 mg Oral Q breakfast   levothyroxine  88 mcg Oral QAC breakfast   melatonin  5 mg Oral QHS   multivitamin with minerals  1 tablet Oral q AM    mupirocin ointment  1 Application Topical Daily   polyethylene glycol  17 g Oral BID   quiniDINE gluconate  324 mg Oral BID   sodium chloride flush  3 mL Intravenous Q12H   torsemide  40 mg Oral q AM   And   torsemide  20 mg Oral QPM   warfarin  2.5 mg Oral q1600   Warfarin - Pharmacist Dosing Inpatient   Does not apply q1600    Assessment: 82 yom presenting after ICD shocks - on enoxaparin and warfarin PTA (LD 9/10 PM). PTA regimen 2.5 mg TTSS and  5 mg MWF. S/P undergoing bridge anticoagulation given recent admission for Barostim implant complicated with L chest wall hematoma requiring I&D > barostim removed 9/17 and vac removed.  INR increased to 3.4 (therapeutic). CBC stable yesterday- Hgb 9.2, plt 104. Also on cipro for pseudomonas wound infection - can increase INR.   PTA warfarin 2.5mg  TTSS / 5mg  MWF  Goal of Therapy:  INR 3-3.5   Monitor platelets by anticoagulation protocol: Yes   Plan:  Continue warfarin 2.5 mg tonight   Daily INR, CBC q72 hr If discharges would consider warfarin 2.5 mg daily while on ciprofloxacin - would encourage INR check on Monday/Tuesday to see if need to increase similar to prior regimen  Thank  you for allowing pharmacy to participate in this patient's care,  Sherron Monday, PharmD, BCCCP Clinical Pharmacist  Phone: [phone] 04/22/2023 8:30 AM  Please check AMION for all [facility] phone numbers After 10:00 PM, call Main Pharmacy [phone]

## 2023-04-22 NOTE — Progress Notes (Signed)
Report called to [facility] at  [phone].

## 2023-04-22 NOTE — Progress Notes (Signed)
PTAR arrived for patient and all belongings were sent with patient. His home medication was sent with his info packet and he took his 2200 medications before leaving tonight. Patient's v/s were stable and he was glad to be going back to [facility].

## 2023-04-22 NOTE — Progress Notes (Addendum)
Patient ID: Austin [name], male   DOB: [birth year], 82 y.o.   MRN: [id number]     Advanced Heart Failure Rounding Note  PCP-Cardiologist: None   Subjective:   Patient was paced out of slow VT on 9/13.  Since then, has been in and out of slow VT.  Was out of VT on 9/14 but went back in slow VT 9/15 Back in slow VT for a couple of hours rate around 100.  When out of VT, he has had atrial tachycardia with intermittent RV pacing. EP saw and programmed ( RV lead only) to 100 . Switched to lasix drip and given metolazone. 9/16 Back in VT  9/17 VT Paced out. S/P Barostimulator removal.  9/19 Transitioned to PO amiodarone. Lasix gtt d/c 9/20 wound cx + pseudomonas. Cipro started. 9/20 paced out of VT   Co-ox stable  72%.    INR 3.4.    Feels ok. Denies SOB.  Objective:   Weight Range: 88.2 kg Body mass index is 25.65 kg/m.   Vital Signs:   Temp:  [97.8 F (36.6 C)-98.7 F (37.1 C)] 97.9 F (36.6 C) (09/27 0554) Resp:  [17-20] 19 (09/27 0554) BP: (89-114)/(58-76) 99/67 (09/27 0554) SpO2:  [97 %-99 %] 97 % (09/27 0554) Weight:  [88.2 kg] 88.2 kg (09/27 0559) Last BM Date : 04/19/23  Weight change: Filed Weights   04/20/23 0315 04/21/23 0500 04/22/23 0559  Weight: 88.9 kg 88.8 kg 88.2 kg    Intake/Output:   Intake/Output Summary (Last 24 hours) at 04/22/2023 0724 Last data filed at 04/22/2023 0555 Gross per 24 hour  Intake 600 ml  Output 1030 ml  Net -430 ml      Physical Exam  General:   No resp difficulty HEENT: normal Neck: supple. no JVD. Carotids 2+ bilat; no bruits. No lymphadenopathy or thryomegaly appreciated. Cor: PMI nondisplaced. Regular rate & rhythm. No rubs, gallops or murmurs. R upper chest dressing CDI.  Lungs: clear Abdomen: soft, nontender, nondistended. No hepatosplenomegaly. No bruits or masses. Good bowel sounds. Extremities: no cyanosis, clubbing, rash, R and LLE trace-1+ edema. RUE and LUE dressing.  Neuro: alert & orientedx3, cranial  nerves grossly intact. moves all 4 extremities w/o difficulty. Affect pleasant   Telemetry   A-V paced no VT   EKG    No new EKG to review   Labs    CBC Recent Labs    04/21/23 0525  WBC 7.7  HGB 9.2*  HCT 30.3*  MCV 97.1  PLT 104*   Basic Metabolic Panel Recent Labs    40/98/11 0525 04/22/23 0540  NA 133* 132*  K 3.5 3.4*  CL 93* 94*  CO2 31 31  GLUCOSE 129* 90  BUN 33* 32*  CREATININE 3.05* 3.12*  CALCIUM 8.3* 7.9*   Liver Function Tests No results for input(s): "AST", "ALT", "ALKPHOS", "BILITOT", "PROT", "ALBUMIN" in the last 72 hours.   No results for input(s): "LIPASE", "AMYLASE" in the last 72 hours. Cardiac Enzymes No results for input(s): "CKTOTAL", "CKMB", "CKMBINDEX", "TROPONINI" in the last 72 hours.  BNP: BNP (last 3 results) Recent Labs    02/03/23 1502 03/23/23 1045 04/06/23 1026  BNP 453.3* 352.5* 1,258.7*    ProBNP (last 3 results) Recent Labs    12/16/22 0959  PROBNP 3,692*   D-Dimer No results for input(s): "DDIMER" in the last 72 hours. Hemoglobin A1C No results for input(s): "HGBA1C" in the last 72 hours. Fasting Lipid Panel No results for input(s): "CHOL", "HDL", "LDLCALC", "TRIG", "  CHOLHDL", "LDLDIRECT" in the last 72 hours. Thyroid Function Tests No results for input(s): "TSH", "T4TOTAL", "T3FREE", "THYROIDAB" in the last 72 hours.  Invalid input(s): "FREET3"  Other results:  Imaging   No results found.  Medications:   Scheduled Medications:  amiodarone  400 mg Oral BID   Chlorhexidine Gluconate Cloth  6 each Topical Daily   ciprofloxacin  500 mg Oral Q breakfast   levothyroxine  88 mcg Oral QAC breakfast   melatonin  5 mg Oral QHS   multivitamin with minerals  1 tablet Oral q AM   mupirocin ointment  1 Application Topical Daily   polyethylene glycol  17 g Oral BID   quiniDINE gluconate  324 mg Oral BID   sodium chloride flush  3 mL Intravenous Q12H   torsemide  40 mg Oral q AM   And   torsemide  20 mg  Oral QPM   warfarin  2.5 mg Oral q1600   Warfarin - Pharmacist Dosing Inpatient   Does not apply q1600    Infusions:  sodium chloride      PRN Medications: sodium chloride, acetaminophen, HYDROcodone-acetaminophen, nitroGLYCERIN, ondansetron (ZOFRAN) IV, mouth rinse, polyethylene glycol, sodium chloride flush  Patient Profile  82 y/o male w/ 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    Now readmitted for ICD shocks for VT and a/c CHF.  Assessment/Plan  1. VT: History of VT and VT ablation in 2017.  MDT ICD.  Redo VT ablation in 1/23 (Dr. Marquette Saa).  He is on amiodarone, this was increased to 200 mg daily as outpatient with recurrent VT.  He has tolerated amiodarone poorly historically but is doing ok with it today.  In 7/23, slow VT was pace-terminated in the office, then he had an ICD shock for VT later in 7/23.  He was started on quinidine after this.  He is followed by Dr. Ladona Ridgel.  Now with 2 further ICD shocks for VT prior to this admission, had been in slow VT rate 100s-110s this admission.  Paced out of VT 9/13 by EP, since then has been in and out of slow VT.  At baseline, he RV paces most of the time. EP decreased pacing rate to 85 bpm. No further VT for the last several days.  Now on PO amio, stable.  - Continue PO amio 400 mg bid   - Continue quinidine.  2. Acute on chronic systolic CHF: Long-standing, suspected nonischemic cardiomyopathy.  Had RHC on 02/26/21 with normal filling pressures and mildly low cardiac output.  Echo in 4/23 showed  EF 20-25% with diffuse hypokinesis and septal-lateral dyssynchrony, mildly decreased RV systolic function, mechanical MV with normal function, dilated IVC.  He has a [employer] CRT-D device  present, but the LV lead is not functional.  He has complete heart block, so has been RV paced chronically with a wide QRS.  Has had difficulty tolerating GDMT historically with CKD and hypotension.  Currently off his home GDMT due to low BP and elevated creatinine.  Echo this admission with EF 20%, mechanical MV mean gradient 8 mmHg.   - CO-OX stable. CVP 8-9. Continue torsemide 40 mg/20mg .  - He has advanced biventricular HF but now stabilized off of milrinone.  - Off Spironolactone, valsartan, and Toprol XL with low  SBP and AKI.  - Off digoxin due to persistently elevated level.  - He did not tolerate SGLT2 inhibitor in the past.  - The patient has CHB and has been chronically RV pacing as his LV lead is nonfunctional.  This is not ideal and creates dyssynchrony with a very wide QRS.  Left bundle lead placement discussed in past with Dr. Ladona Ridgel but thought risk would be too high and left bundle lead was not attempted.   - Barostimulator device placed but complicated by chest wall hematoma as below and has been removed.  3. Atrial fibrillation/flutter, atrial tachycardia: Paroxysmal.  Earlier this admission, when out of VT, he has been in atrial tachycardia. Has been stable v-pacing.  - Continue amiodarone - Continue warfarin.  4. Complete heart block: Patient is pacer dependent due to CHB.  - See above, upgrade to functional CRT thought to be too risky due to need to extract old CS lead.  5. OSA: Severe. Continue CPAP 6. Mechanical mitral valve: Mean gradient higher on echo this admission at 8 mmHg. INR 3.4   - Continue warfarin INR goal 2.5-3.5.  7. Chest Wall Hematoma: post barostimulator complication, in setting of coumadin for mechanical MV.  s/p I&D for evacuation of hematoma and placement of wound vac + placement of antibiotic beads on 8/30. Barostimulator device removed 9/17, still has drain in place. Wound cx with Pseudomonas.  - ID has seen, plan for 10 day course of Ciprofloxacin.  -  vascular following.   8. AKI on CKD stage 3: Creatinine baseline 1.5-2.  Peaked 3.8-->3.12 today  - see plan as above - Not candidate for HD given age and end-stage HF. May be headed toward Palliative Care pathway 9. Deconditioning: PT recommending SNF. TOC consulted for placement.   Working on Rehab at [employer]. His wife will be cared for in Res[facility] at [facility] until he is can return home. TOC Team appreciated.   Length of Stay: 16  Amy Clegg, NP  04/22/2023, 7:24 AM  Advanced Heart Failure Team Pager [phone] (M-F; 7a - 5p)  Please contact [facility] for night-coverage after hours (5p -7a ) and weekends on [email or web address]  Patient seen and examined with the above-signed Advanced Practice Provider and/or Housestaff. I personally reviewed laboratory data, imaging studies and relevant notes. I independently examined the patient and formulated the important aspects of the plan. I have edited the note to reflect any of my changes or salient points. I have personally discussed the plan with the patient and/or family.  Doing well. Denies CP or SOB. Co-ox and volume status stable.   General: Sitting in chair No resp difficulty HEENT: normal Neck: supple. JVP 8-9  Carotids 2+ bilat; no bruits. No lymphadenopathy or thryomegaly appreciated. Cor: Regular rate & rhythm. No rubs, gallops or murmurs. Lungs: clear Abdomen: soft, nontender, nondistended. No hepatosplenomegaly. No bruits or masses. Good bowel sounds. Extremities: no cyanosis, clubbing, rash, tr edema Neuro: alert & orientedx3, cranial nerves grossly intact. moves all 4 extremities w/o difficulty. Affect pleasant  Stable from HF and renal standpoint. BP still too low for GDMT. Ready for d/c when bed available. Will f/u with SW.  Arvilla Meres, MD  8:55 AM

## 2023-04-22 NOTE — TOC Progression Note (Signed)
Transition of Care [facility]) - Progression Note    Patient Details  Name: Austin [name] MRN: [id number] Date of Birth: [birth year]  [facility]) CM/SW Contact  Nicanor Bake Phone Number: [phone] 04/22/2023, 2:37 PM  Clinical Narrative:  PT has been approved 9/27 - 10/1 navi Auth ID [id number].       Expected Discharge Plan: Home w Home Health Services Barriers to Discharge: Continued Medical Work up  Expected Discharge Plan and Services     Post Acute Care Choice: Home Health Living arrangements for the past 2 months: Independent Living Facility Expected Discharge Date: 04/22/23                                     Social Determinants of Health (SDOH) Interventions SDOH Screenings   Food Insecurity: No Food Insecurity (04/06/2023)  Housing: Low Risk  (04/06/2023)  Transportation Needs: No Transportation Needs (04/06/2023)  Utilities: Not At Risk (04/06/2023)  Alcohol Screen: Low Risk  (03/08/2023)  Depression (PHQ2-9): Low Risk  (03/08/2023)  Financial Resource Strain: Low Risk  (03/08/2023)  Physical Activity: Insufficiently Active (03/08/2023)  Social Connections: Moderately Integrated (03/08/2023)  Stress: No Stress Concern Present (03/08/2023)  Tobacco Use: Low Risk  (04/06/2023)  Health Literacy: Adequate Health Literacy (03/08/2023)    Readmission Risk Interventions     No data to display

## 2023-04-22 NOTE — Progress Notes (Signed)
Physical Therapy Treatment Patient Details Name: Austin [name] MRN: [id number] DOB: 1940-12-04 Today's Date: 04/22/2023   History of Present Illness Pt is an 82 y.o. male who presented 04/06/23 with ICD shocks. Of note, pt underwent Barostim device 03/17/23. S/p removal of L sided Barostim device and redo L carotid artery exposure 9/17. PMH: Mechanical MV s/p MV endocarditis, VT, NICM, CHB and PAF/AFL, CKD, CHF, syncope, sleep apnea    PT Comments  Patient progressing with mobility though limited distance compared to prior session with planned d/c this pm to rehab.  He reported felt different walking in shoes and noted having to increase foot clearance.  Patient eager to continue to improve, though appropriate for inpatient rehab (<3hours/day) to allow increased recovery time to achieve independence.      If plan is discharge home, recommend the following: A little help with walking and/or transfers;A little help with bathing/dressing/bathroom;Assistance with cooking/housework;Assist for transportation   Can travel by private vehicle        Equipment Recommendations  Other (comment) (TBA)    Recommendations for Other Services       Precautions / Restrictions Precautions Precautions: Fall Precaution Comments: JP drain L chest     Mobility  Bed Mobility Overal bed mobility: Needs Assistance Bed Mobility: Supine to Sit   Sidelying to sit: Min assist Supine to sit: Min assist, HOB elevated     General bed mobility comments: HHA to elevate trunk    Transfers Overall transfer level: Needs assistance Equipment used: Rolling walker (2 wheels) Transfers: Sit to/from Stand Sit to Stand: Min assist           General transfer comment: up to standard RW to pull up briefs and pants as pt dressing for transport to rehab at [facility] this pm; initial posterior bias and some help for lift off from regular height surface    Ambulation/Gait Ambulation/Gait assistance: Min  assist, Contact guard assist Gait Distance (Feet): 200 Feet Assistive device: Rollator (4 wheels) Gait Pattern/deviations: Step-through pattern, Decreased stride length, Trunk flexed       General Gait Details: increased time for stepping and turns wearing tennis shoes and reports feels different having to lift feet more; assist for balance and due to pt reporting LE weakness   Stairs             Wheelchair Mobility     Tilt Bed    Modified Rankin (Stroke Patients Only)       Balance Overall balance assessment: Needs assistance   Sitting balance-Leahy Scale: Good Sitting balance - Comments: EOB to don shirt and pants with NT assist   Standing balance support: Bilateral upper extremity supported, Single extremity supported, During functional activity Standing balance-Leahy Scale: Poor Standing balance comment: unsteady when attemting to pull up pants with both hands off RW needing min A for balance                            Cognition Arousal: Alert Behavior During Therapy: WFL for tasks assessed/performed Overall Cognitive Status: Within Functional Limits for tasks assessed                                          Exercises      General Comments General comments (skin integrity, edema, etc.): monitors off as for d/c this pm. but pt in  no apparent distress      Pertinent Vitals/Pain Pain Assessment Pain Assessment: No/denies pain    Home Living                          Prior Function            PT Goals (current goals can now be found in the care plan section) Progress towards PT goals: Progressing toward goals    Frequency    Min 1X/week      PT Plan      Co-evaluation              AM-PAC PT "6 Clicks" Mobility   Outcome Measure  Help needed turning from your back to your side while in a flat bed without using bedrails?: A Little Help needed moving from lying on your back to sitting on the  side of a flat bed without using bedrails?: A Little Help needed moving to and from a bed to a chair (including a wheelchair)?: A Little Help needed standing up from a chair using your arms (e.g., wheelchair or bedside chair)?: A Little Help needed to walk in hospital room?: A Little Help needed climbing 3-5 steps with a railing? : Total 6 Click Score: 16    End of Session Equipment Utilized During Treatment: Gait belt Activity Tolerance: Patient tolerated treatment well Patient left: in chair;with call bell/phone within reach   PT Visit Diagnosis: Other abnormalities of gait and mobility (R26.89);Muscle weakness (generalized) (M62.81)     Time: 4742-5956 PT Time Calculation (min) (ACUTE ONLY): 29 min  Charges:    $Gait Training: 8-22 mins $Therapeutic Activity: 8-22 mins PT General Charges $$ ACUTE PT VISIT: 1 Visit                     .Ian Malkin 04/22/2023, 5:01 PM

## 2023-04-22 NOTE — Progress Notes (Addendum)
  Progress Note    04/22/2023 7:34 AM 10 Days Post-Op  Subjective: Sleeping    Vitals:   04/21/23 2307 04/22/23 0554  BP: 103/65 99/67  Pulse:    Resp: 18 19  Temp: 97.8 F (36.6 C) 97.9 F (36.6 C)  SpO2: 99% 97%    Physical Exam: General: Resting, no acute distress Lungs: Nonlabored Incisions: Left-sided neck and chest incisions healing appropriately.  Staples intact left chest incision.  Left chest JP with 30 cc output in 24 hours Extremities: BLE warm and well-perfused  CBC    Component Value Date/Time   WBC 7.7 04/21/2023 0525   RBC 3.12 (L) 04/21/2023 0525   HGB 9.2 (L) 04/21/2023 0525   HGB 16.6 12/04/2020 1445   HCT 30.3 (L) 04/21/2023 0525   HCT 50.1 12/04/2020 1445   PLT 104 (L) 04/21/2023 0525   PLT 158 12/04/2020 1445   MCV 97.1 04/21/2023 0525   MCV 94 12/04/2020 1445   MCH 29.5 04/21/2023 0525   MCHC 30.4 04/21/2023 0525   RDW 19.1 (H) 04/21/2023 0525   RDW 13.7 12/04/2020 1445   LYMPHSABS 0.3 (L) 04/06/2023 1030   MONOABS 1.1 (H) 04/06/2023 1030   EOSABS 0.1 04/06/2023 1030   BASOSABS 0.1 04/06/2023 1030    BMET    Component Value Date/Time   NA 132 (L) 04/22/2023 0540   NA 139 01/29/2021 0853   K 3.4 (L) 04/22/2023 0540   CL 94 (L) 04/22/2023 0540   CO2 31 04/22/2023 0540   GLUCOSE 90 04/22/2023 0540   BUN 32 (H) 04/22/2023 0540   BUN 18 01/29/2021 0853   CREATININE 3.12 (H) 04/22/2023 0540   CREATININE 1.22 (H) 09/12/2015 0846   CALCIUM 7.9 (L) 04/22/2023 0540   GFRNONAA 19 (L) 04/22/2023 0540   GFRAA 56 (L) 04/30/2020 1144    INR    Component Value Date/Time   INR 3.4 (H) 04/22/2023 0540     Intake/Output Summary (Last 24 hours) at 04/22/2023 0734 Last data filed at 04/22/2023 0555 Gross per 24 hour  Intake 600 ml  Output 1030 ml  Net -430 ml      Assessment/Plan:  82 y.o. male is 10 days postop, s/p: barostim removal    -No events overnight.  Patient is resting this morning -All incisions are healing  appropriately.  Staples are intact at left-sided chest incision -JP drain from left chest site with 30 cc output, continue to suction  [name], PA-C [facility] [phone] 04/22/2023 7:34 AM  I agree with the above.  I have seen and evaluated the patient.  He is potentially being discharged today.  He will go home with the JP drain in place.  Nursing will teach drain care.  I told him to record 24-hour output.  He will follow-up with me in the office in 2 to 3 weeks.  Durene Cal

## 2023-04-22 NOTE — Plan of Care (Signed)
  Problem: Education: Goal: Knowledge of General Education information will improve Description Including pain rating scale, medication(s)/side effects and non-pharmacologic comfort measures Outcome: Adequate for Discharge   Problem: Health Behavior/Discharge Planning: Goal: Ability to manage health-related needs will improve Outcome: Adequate for Discharge   Problem: Clinical Measurements: Goal: Ability to maintain clinical measurements within normal limits will improve Outcome: Adequate for Discharge Goal: Will remain free from infection Outcome: Adequate for Discharge Goal: Diagnostic test results will improve Outcome: Adequate for Discharge Goal: Respiratory complications will improve Outcome: Adequate for Discharge Goal: Cardiovascular complication will be avoided Outcome: Adequate for Discharge   Problem: Activity: Goal: Risk for activity intolerance will decrease Outcome: Adequate for Discharge   Problem: Elimination: Goal: Will not experience complications related to bowel motility Outcome: Adequate for Discharge Goal: Will not experience complications related to urinary retention Outcome: Adequate for Discharge   Problem: Pain Managment: Goal: General experience of comfort will improve Outcome: Adequate for Discharge   Problem: Safety: Goal: Ability to remain free from injury will improve Outcome: Adequate for Discharge   Problem: Skin Integrity: Goal: Risk for impaired skin integrity will decrease Outcome: Adequate for Discharge

## 2023-04-22 NOTE — Progress Notes (Signed)
Patient stated he is getting impatient waiting for [facility] to get here to transport him back to [facility]. He stated "I've been waiting 4 hours now." "Are they coming?" [facility] office called and I was told he was the next to be picked up. Waiting patiently for his ride.

## 2023-04-22 NOTE — Progress Notes (Signed)
Mobility Specialist Progress Note:   04/22/23 1000  Mobility  Activity Ambulated with assistance in hallway  Level of Assistance Standby assist, set-up cues, supervision of patient - no hands on  Assistive Device Four wheel walker  Distance Ambulated (ft) 340 ft  Activity Response Tolerated well  Mobility Referral Yes  $Mobility charge 1 Mobility  Mobility Specialist Start Time (ACUTE ONLY) [phone]  Mobility Specialist Stop Time (ACUTE ONLY) 0937  Mobility Specialist Time Calculation (min) (ACUTE ONLY) 20 min    Pre Mobility: 85 HR During Mobility: 86 HR Post Mobility:  85 HR  Pt received in bed, agreeable to mobility. C/o BLE weakness upon standing but got better with ambulation. Took x3 brief standing rest breaks. VSS throughout. Pt left in chair with call bell and all needs met.  Austin [name] Mobility Specialist Please contact via Special educational needs teacher or Rehab office at [phone]

## 2023-04-22 NOTE — TOC Progression Note (Signed)
Transition of Care [facility]) - Progression Note    Patient Details  Name: Austin [name] MRN: [id number] Date of Birth: Oct 07, 1940  Transition of Care [facility]) CM/SW Contact  Elliot Cousin, RN Phone Number: [phone] 04/22/2023, 3:35 PM  Clinical Narrative:  Sherron Monday to [facility], Amber to clarify dc summary. States she needs dc summary signed. Number to call report, [phone], or [phone]. Transport to [facility] via [place].     Expected Discharge Plan: Home w Home Health Services Barriers to Discharge: No Barriers Identified  Expected Discharge Plan and Services     Post Acute Care Choice: Home Health Living arrangements for the past 2 months: Independent Living Facility Expected Discharge Date: 04/22/23                                     Social Determinants of Health (SDOH) Interventions SDOH Screenings   Food Insecurity: No Food Insecurity (04/06/2023)  Housing: Low Risk  (04/06/2023)  Transportation Needs: No Transportation Needs (04/06/2023)  Utilities: Not At Risk (04/06/2023)  Alcohol Screen: Low Risk  (03/08/2023)  Depression (PHQ2-9): Low Risk  (03/08/2023)  Financial Resource Strain: Low Risk  (03/08/2023)  Physical Activity: Insufficiently Active (03/08/2023)  Social Connections: Moderately Integrated (03/08/2023)  Stress: No Stress Concern Present (03/08/2023)  Tobacco Use: Low Risk  (04/06/2023)  Health Literacy: Adequate Health Literacy (03/08/2023)    Readmission Risk Interventions     No data to display

## 2023-04-25 ENCOUNTER — Non-Acute Institutional Stay (SKILLED_NURSING_FACILITY): Payer: Medicare Other | Admitting: Sports Medicine

## 2023-04-25 ENCOUNTER — Encounter: Payer: Self-pay | Admitting: Sports Medicine

## 2023-04-25 DIAGNOSIS — F5101 Primary insomnia: Secondary | ICD-10-CM

## 2023-04-25 DIAGNOSIS — G4733 Obstructive sleep apnea (adult) (pediatric): Secondary | ICD-10-CM

## 2023-04-25 DIAGNOSIS — T827XXS Infection and inflammatory reaction due to other cardiac and vascular devices, implants and grafts, sequela: Secondary | ICD-10-CM | POA: Diagnosis not present

## 2023-04-25 DIAGNOSIS — I4891 Unspecified atrial fibrillation: Secondary | ICD-10-CM

## 2023-04-25 DIAGNOSIS — I5023 Acute on chronic systolic (congestive) heart failure: Secondary | ICD-10-CM

## 2023-04-25 DIAGNOSIS — N1832 Chronic kidney disease, stage 3b: Secondary | ICD-10-CM | POA: Diagnosis not present

## 2023-04-25 DIAGNOSIS — Z952 Presence of prosthetic heart valve: Secondary | ICD-10-CM | POA: Diagnosis not present

## 2023-04-25 DIAGNOSIS — E032 Hypothyroidism due to medicaments and other exogenous substances: Secondary | ICD-10-CM | POA: Diagnosis not present

## 2023-04-25 MED ORDER — MIRTAZAPINE 7.5 MG PO TABS: 7.5000 mg | ORAL_TABLET | Freq: Every day | ORAL | Status: DC

## 2023-04-25 NOTE — Progress Notes (Signed)
Provider:  Andree Coss Location:   [facility]   Place of Service:   [facility]   PCP: [name], MD Patient Care Team: [name], MD as PCP - General (Internal Medicine) Marinus Maw, MD as PCP - Electrophysiology (Cardiology)  Extended Emergency Contact Information Primary Emergency Contact: [facility] Phone: [phone] Relation: Daughter Preferred language: [place] Secondary Emergency Contact: Raymondo Band Address: [address]          [place], [place] [address] [name] Home Phone: [phone] Mobile Phone: [phone] Relation: Spouse  Code Status:  Goals of Care: Advanced Directive information    04/06/2023    3:00 PM  Advanced Directives  Does Patient Have a Medical Advance Directive? Yes  Type of Advance Directive Healthcare Power of Attorney  Does patient want to make changes to medical advance directive? No - Patient declined  Copy of Healthcare Power of Attorney in Chart? No - copy requested      No chief complaint on file.   HPI: Patient is a 82 y.o. 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  Pt seen and examined in his room.  He seems to be pleasant and sitting comfortably in his recliner chair, his friend visiting him. Pt states he could not sleep well last night as staff woke him up in the middle of the night to check his vitals as per admission policy to check vitals every shift. He is asking for some thing stronger    Pt c/o feeling dizzy  occasionally when standing up too quickly or when walking He is able to transfer himself and walk to the dining room  with the help of his Rolator walker  Pt has exertional SOB, no recent change  Denies chest pain,palpitations He has chronic lower extremity swelling , no recent change     As per d/c summary   Primary Discharge Diagnoses:  VT w/ ICD Shocks Acute on Chronic Systolic Heart Failure w/ Low Output AKI on Stage IIIb CKD Chest Wall Hematoma post barostimulator complication, s/p barostimulator removal  Wound Infection, Pseudomonas (left chest incision/barostimulator site) Physical Deconditioning   Secondary Discharge Diagnoses:  Mechanical Mitral Valve Chronic Coumadin Therapy  Paroxsysmal Atrial Fibrillation/Flutter  OSA on CPAP  H/o CHB  Hospital Course:  Austin [name] is a 82 y/o male w/ 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    Readmitted on 9/11 for ICD shocks for VT and a/c CHF w/ volume overload. He was placed on IV amio. Rates improved but was in and out of slow VT during admission. Paced out of VT 9/13 by EP. EP decreased pacing rate to 85 bpm. Had improvement and VT remained quiescent. Transitioned back to PO amiodarone 400 mg bid and quinidine. Of note he had bilateral upper extremity IV infiltrates.    AHF team consulted for volume management. Diuresis was c/b worsening renal fx/ AKI. He required inotropic support w/ milrinone  to help augment diuresis. Volume status improved as did renal fx. Was transitioned back to PO torsemide 40 mg twice a day.  He was weaned of milrinone w/ stable co-ox. Off GDMT due to AKI/shock.    Once improved from a cardiac standpoint, he underwent Barostimulator device removal by VVS on 9/17. Wound cx grew Pseudomonas. ID consulted and recommended 10 day course of Ciprofloxacin. He will need  3 more days  to complete. He will continue JP drain until his follow up with Vascular Surgery and will need JP emptied daily.    Due to deconditioning PT/OT recommended SNF. Plan to continue PT/OT. At SNF. Discharge to [facility].   He will contine to be followed closely in the HF clinic. F/U with EP and Vascular arranged. He will need INR check 04/26/23 with results provided to the Coumadin Clinic [phone].     Medication List       STOP taking these medications     amoxicillin-clavulanate 875-125 MG tablet Commonly known as: AUGMENTIN    doxycycline 100 MG tablet Commonly known as: VIBRA-TABS    enoxaparin 100 MG/ML injection Commonly known as: LOVENOX    furosemide 20 MG tablet Commonly known as: LASIX    HYDROcodone-acetaminophen 5-325 MG tablet Commonly known as: Norco    zinc gluconate 50 MG tablet           TAKE these medications     acetaminophen 500 MG tablet Commonly known as: TYLENOL Take 1,000 mg by mouth every 8 (eight) hours as needed for headache, fever or mild pain.    amiodarone 200 MG tablet Commonly known as: PACERONE Take amio 400 mg twice a day What changed:  how much to take how to take this when to take this additional instructions    amoxicillin 500 MG capsule Commonly known as: AMOXIL TAKE 4 CAPSULES BY MOUTH 30 MINUTES PRIOR TO DENTAL WORK    ciprofloxacin 500 MG tablet Commonly known as: CIPRO Take 1 tablet (500 mg total) by mouth daily with breakfast. Start taking on: April 23, 2023    levothyroxine 88 MCG tablet Commonly known as: SYNTHROID TAKE 1 TABLET BY MOUTH EVERY DAY    melatonin 5 MG Tabs Take 5 mg by mouth at bedtime.    multivitamin with minerals Tabs tablet Take 1 tablet by mouth in the morning.    mupirocin ointment 2 % Commonly known as: BACTROBAN Apply 1 Application topically daily. What changed:  when to take this reasons to take this    nitroGLYCERIN 0.4 MG SL tablet Commonly known as:  NITROSTAT Place 1 tablet (0.4 mg total) under the tongue every 5 (five) minutes x 3 doses as needed for chest pain.    quiniDINE gluconate 324 MG CR tablet TAKE 1 TABLET BY MOUTH 2 TIMES DAILY.    torsemide 20 MG tablet Commonly known as: DEMADEX Take 2 tablets (40 mg total) by mouth 2 (two) times daily.    warfarin 2.5 MG tablet Commonly known as: COUMADIN Take as directed. If you are unsure how to take this medication, talk to your nurse or doctor. Original instructions: Take 1 tablet (2.5 mg total) by mouth daily at 4 PM. What changed:  medication strength See the new instructions.     Discharge Instructions       Diet - low sodium heart healthy   Complete by: As directed      Discharge instructions   Complete by: As directed      Record and  empty JP drain daily.  CHeck INR 04/27/23 with pharmacy to adjust coumadin dose.    Increase activity slowly   Complete by: As directed             Follow-up Information       [facility] Follow up.   Specialty: Cardiology Why: 05/05/23 at 10:30 AM    Hospital Follow-up in the [facility] Failure [facility] (Dr. Alford Highland office) Contact information: [address] [place] [address] [phone]            [facility] Follow up in 2 week(s).   Specialty: Vascular Surgery Why: Office will call you to arrange your appt (sent). Contact information: [address] [place] [address] [phone]             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   Conductive hearing loss    uses hearing aids   Dysrhythmia    A-fib    History of blood transfusion 03/24/2023   Hypothyroidism    Intraspinal abscess    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   Nonischemic cardiomyopathy (HCC)    Psychosexual dysfunction with inhibited sexual excitement    S/P mitral valve replacement    Syncope and collapse    Unspecified sleep apnea    last sleep study 11/07, uses cpap   Ventricular tachycardia (HCC)    VT (ventricular tachycardia) (HCC)    Past Surgical History:  Procedure Laterality Date   BIV ICD GENERATOR CHANGEOUT N/A 05/04/2017   Procedure: BiV ICD Generator Changeout;  Surgeon: Marinus Maw, MD;  Location: [facility] INVASIVE CV LAB;  Service: Cardiovascular;  Laterality: N/A;   CARDIAC CATHETERIZATION  04/24/2002   CARDIAC VALVE REPLACEMENT     CARDIOVERSION  03/09/2012   Procedure: CARDIOVERSION;  Surgeon: Marinus Maw, MD;  Location: [facility] ENDOSCOPY;  Service: Cardiovascular;  Laterality: N/A;   CARDIOVERSION N/A 11/22/2013   Procedure: CARDIOVERSION;  Surgeon: Thurmon Fair, MD;  Location: [facility] ENDOSCOPY;  Service: Cardiovascular;  Laterality: N/A;   dental implants     ELECTROPHYSIOLOGIC STUDY N/A 09/15/2015   Procedure: V Tach Ablation;  Surgeon: Marinus Maw, MD;  Location: [facility] INVASIVE CV LAB;  Service: Cardiovascular;  Laterality: N/A;   Evacution of epidural lumbar epidural abscess  07/26/1997   INCISION AND DRAINAGE OF WOUND Left 03/25/2023   Procedure: Washout of Chest Wall Hematoma;  Surgeon: Nada Libman, MD;  Location: [facility] OR;  Service: Vascular;  Laterality: Left;   INCISION AND DRAINAGE OF WOUND N/A 03/29/2023   Procedure: IRRIGATION AND DEBRIDEMENT  CHEST WALL;  Surgeon: Nada Libman, MD;  Location: [facility] OR;  Service: Vascular;  Laterality: N/A;   INSERT / REPLACE / REMOVE PACEMAKER  11/23/2008   MITRAL VALVE REPLACEMENT     w #[id number] [facility]   PERMANENT PACEMAKER INSERTION N/A 08/05/2011   Procedure: PERMANENT PACEMAKER INSERTION;  Surgeon: Marinus Maw, MD;  Location: [facility] CATH LAB;  Service: Cardiovascular;  Laterality: N/A;   REMOVAL OF BAROSTIM N/A 04/12/2023   Procedure: REMOVAL OF [name];  Surgeon: Nada Libman,  MD;  Location: [facility] OR;  Service: Vascular;  Laterality: N/A;   RIGHT HEART CATH N/A 02/26/2021   Procedure: RIGHT HEART CATH;  Surgeon: Laurey Morale, MD;  Location: [facility] INVASIVE CV LAB;  Service: Cardiovascular;  Laterality: N/A;   THYROIDECTOMY     TONSILLECTOMY     V-TACH ABLATION  07/30/2021   in [place]   VALVE REPLACEMENT  07/26/1998    reports that he has never smoked. He has never used smokeless tobacco. He reports that he does not currently use alcohol. He reports that he does not use drugs. Social History   Socioeconomic History   Marital status: Married    Spouse name: Not on file   Number of children: 1   Years of education: 23   Highest education level: Master's degree (e.g., MA, MS, MEng, MEd, MSW, MBA)  Occupational History   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  Has one daughter.  Retired.  Education: Masters.   Social Determinants of Health   Financial Resource Strain: Low Risk  (03/08/2023)   Overall Financial Resource Strain (CARDIA)    Difficulty of Paying Living Expenses: Not hard at all  Food Insecurity: No Food Insecurity (04/06/2023)   Hunger Vital Sign    Worried About Running Out of Food in the Last Year: Never true    Ran Out of Food in the Last Year: Never true  Transportation Needs: No Transportation Needs (04/06/2023)   PRAPARE - Administrator, Civil Service (Medical): No    Lack of Transportation (Non-Medical): No   Physical Activity: Insufficiently Active (03/08/2023)   Exercise Vital Sign    Days of Exercise per Week: 4 days    Minutes of Exercise per Session: 30 min  Stress: No Stress Concern Present (03/08/2023)   [employer] of Occupational Health - Occupational Stress Questionnaire    Feeling of Stress : Not at all  Social Connections: Moderately Integrated (03/08/2023)   Social Connection and Isolation Panel [NHANES]    Frequency of Communication with Friends and Family: More than three times a week    Frequency of Social Gatherings with Friends and Family: Twice a week    Attends Religious Services: More than 4 times per year    Active Member of [employer] or Organizations: No    Attends Banker Meetings: Never    Marital Status: Married  Catering manager Violence: Not At Risk (04/06/2023)   Humiliation, Afraid, Rape, and Kick questionnaire    Fear of Current or Ex-Partner: No    Emotionally Abused: No    Physically Abused: No    Sexually Abused: No    Functional Status Survey:    Family History  Problem Relation Age of Onset   Heart disease Mother    Heart failure Mother    Heart disease Father    Heart failure Father    Sleep apnea Neg Hx     Health Maintenance  Topic Date Due   DTaP/Tdap/Td (1 - Tdap) Never done   Zoster Vaccines- Shingrix (1 of 2) Never done   COVID-19  Vaccine (3 - Pfizer risk series) 10/01/2019   INFLUENZA VACCINE  02/24/2023   Medicare Annual Wellness (AWV)  03/07/2024   Pneumonia Vaccine 50+ Years old  Completed   HPV VACCINES  Aged Out    No Known Allergies  Outpatient Encounter Medications as of 04/25/2023  Medication Sig   acetaminophen (TYLENOL) 500 MG tablet Take 1,000 mg by mouth every 8 (eight) hours as needed for headache, fever or mild pain.   amiodarone (PACERONE) 200 MG tablet Take amio 400 mg twice a day   amoxicillin (AMOXIL) 500 MG capsule TAKE 4 CAPSULES BY MOUTH 30 MINUTES PRIOR TO DENTAL WORK   ciprofloxacin (CIPRO)  500 MG tablet Take 1 tablet (500 mg total) by mouth daily with breakfast.   levothyroxine (SYNTHROID) 88 MCG tablet TAKE 1 TABLET BY MOUTH EVERY DAY   melatonin 5 MG TABS Take 5 mg by mouth at bedtime.   Multiple Vitamin (MULTIVITAMIN WITH MINERALS) TABS tablet Take 1 tablet by mouth in the morning.   mupirocin ointment (BACTROBAN) 2 % Apply 1 Application topically daily. (Patient taking differently: Apply 1 Application topically daily as needed (Dermatitis).)   nitroGLYCERIN (NITROSTAT) 0.4 MG SL tablet Place 1 tablet (0.4 mg total) under the tongue every 5 (five) minutes x 3 doses as needed for chest pain.   quiniDINE gluconate 324 MG CR tablet TAKE 1 TABLET BY MOUTH 2 TIMES DAILY.   torsemide (DEMADEX) 20 MG tablet Take 2 tablets (40 mg total) by mouth 2 (two) times daily.   warfarin (COUMADIN) 2.5 MG tablet Take 1 tablet (2.5 mg total) by mouth daily at 4 PM.   No facility-administered encounter medications on file as of 04/25/2023.    Review of Systems  Constitutional:  Negative for chills and fever.  HENT:  Negative for sinus pressure and sore throat.   Respiratory:  Positive for shortness of breath (exertional , no recent change). Negative for cough and wheezing.   Cardiovascular:  Positive for leg swelling (chronic). Negative for chest pain and palpitations.  Gastrointestinal:  Negative for abdominal distention, abdominal pain, blood in stool, constipation, diarrhea, nausea and vomiting.  Genitourinary:  Negative for dysuria, frequency and urgency.  Neurological:  Positive for dizziness. Negative for weakness and numbness.  Psychiatric/Behavioral:  Positive for sleep disturbance. Negative for confusion.     There were no vitals filed for this visit. There is no height or weight on file to calculate BMI. Physical Exam Constitutional:      Appearance: Normal appearance.  HENT:     Head: Normocephalic and atraumatic.  Cardiovascular:     Rate and Rhythm: Normal rate and regular  rhythm.     Pulses: Normal pulses.     Heart sounds: Normal heart sounds.  Pulmonary:     Effort: No respiratory distress.     Breath sounds: No stridor. No wheezing or rales.  Abdominal:     General: Bowel sounds are normal. There is no distension.     Palpations: Abdomen is soft.     Tenderness: There is no abdominal tenderness. There is no guarding.  Musculoskeletal:        General: Swelling (chronic 2 + pitting odema with pigmented changes) present.  Neurological:     Mental Status: He is alert. Mental status is at baseline.     Sensory: No sensory deficit.     Motor: No weakness.     Labs reviewed: Basic Metabolic Panel: Recent Labs    04/10/23 1405 04/11/23 0434 04/12/23 0429 04/12/23  5643 04/18/23 0306 04/19/23 0330 04/20/23 0515 04/21/23 0525 04/22/23 0540  NA 130* 134*  --    < > 130*   < > 132* 133* 132*  K 4.0 3.7  --    < > 3.4*   < > 3.5 3.5 3.4*  CL 95* 96*  --    < > 91*   < > 91* 93* 94*  CO2 24 28  --    < > 27   < > 28 31 31   GLUCOSE 224* 132*  --    < > 166*   < > 101* 129* 90  BUN 35* 35*  --    < > 48*   < > 37* 33* 32*  CREATININE 2.28* 2.70*  --    < > 3.88*   < > 2.99* 3.05* 3.12*  CALCIUM 8.0* 8.3*  --    < > 8.2*   < > 8.6* 8.3* 7.9*  MG  --  1.9 2.2  --  2.4  --   --   --   --   PHOS 4.0  --   --   --   --   --   --   --   --    < > = values in this interval not displayed.   Liver Function Tests: Recent Labs    03/17/23 0753 03/24/23 0503 04/06/23 1030 04/10/23 1405  AST 38 25 30  --   ALT 28 20 24   --   ALKPHOS 79 63 78  --   BILITOT 1.0 0.6 1.4*  --   PROT 6.9 5.2* 5.9*  --   ALBUMIN 3.7 2.9* 3.2* 3.0*   No results for input(s): "LIPASE", "AMYLASE" in the last 8760 hours. No results for input(s): "AMMONIA" in the last 8760 hours. CBC: Recent Labs    09/27/22 1612 12/08/22 1612 03/23/23 1018 03/23/23 1223 04/06/23 1030 04/07/23 0247 04/17/23 0305 04/18/23 0306 04/21/23 0525  WBC 8.2   < > 9.6   < > 8.6   < > 7.5 7.0  7.7  NEUTROABS 6.4  --  7.8*  --  7.0  --   --   --   --   HGB 12.4*   < > 9.3*   < > 9.4*   < > 9.3* 9.3* 9.2*  HCT 37.5*   < > 28.4*   < > 31.2*   < > 29.7* 29.6* 30.3*  MCV 91.3   < > 94.7   < > 106.5*   < > 95.8 96.1 97.1  PLT 121.0*   < > 151   < > 146*   < > 93* 90* 104*   < > = values in this interval not displayed.   Cardiac Enzymes: No results for input(s): "CKTOTAL", "CKMB", "CKMBINDEX", "TROPONINI" in the last 8760 hours. BNP: Invalid input(s): "POCBNP" No results found for: "HGBA1C" Lab Results  Component Value Date   TSH 18.440 (H) 04/06/2023   Lab Results  Component Value Date   VITAMINB12 537 03/24/2023   Lab Results  Component Value Date   FOLATE 24.8 03/24/2023   Lab Results  Component Value Date   IRON 60 03/24/2023   TIBC 266 03/24/2023   FERRITIN 144 03/24/2023    Imaging and Procedures obtained prior to SNF admission: ECHOCARDIOGRAM LIMITED  Result Date: 04/07/2023    ECHOCARDIOGRAM LIMITED REPORT   Patient Name:   Austin [name] Date of Exam: 04/07/2023 Medical Rec #:  [id number]  Height:       73.0 in Accession #:    [id number]             Weight:       200.8 lb Date of Birth:  1940/09/10               BSA:          2.155 m Patient Age:    82 years               BP:           102/92 mmHg Patient Gender: M                      HR:           108 bpm. 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 Cardiomyopathy and CHF, Defibrillator and                 Abnormal ECG, Arrythmias:Atrial Fibrillation, Atrial Flutter and                 VT; Signs/Symptoms:Dyspnea and Shortness of Breath.                  Mitral Valve: unknown size mechanical valve valve is present in                 the mitral position.  Sonographer:    Sheralyn Boatman RDCS Referring Phys:  Dorthula Nettles  Sonographer Comments: Patient has wound vac in parasternal region. IMPRESSIONS  1. Left ventricular ejection fraction, by estimation, is 20%. The left ventricle has severely decreased function. The left ventricle demonstrates global hypokinesis. No LV thrombus noted. Indeterminant diastolic function.  2. Peak RV-RA gradient 19 mmHg. IVC not visualized. Right ventricular systolic function is mildly reduced. The right ventricular size is normal.  3. Mechanical mitral valve with mean gradient 8 mmHg, increased from prior. No mitral regurgitation noted though shadowing from prosthetic valve makes this difficult to discern.  4. Tricuspid valve regurgitation is moderate.  5. The aortic valve is tricuspid. There is moderate calcification of the aortic valve. Doppler evaluation of aortic valve not done.  6. Limited echo. FINDINGS  Left Ventricle: Left ventricular ejection fraction, by estimation, is 20%. The left ventricle has severely decreased function. The left ventricle demonstrates global hypokinesis. Left ventricular diastolic parameters are indeterminate. Right Ventricle: Peak RV-RA gradient 19 mmHg. IVC not visualized. The right ventricular size is normal. No increase in right ventricular wall thickness. Right ventricular systolic function is mildly reduced. Mitral Valve: Mechanical mitral valve with mean gradient 8 mmHg, increased from prior. No mitral regurgitation noted though shadowing from prosthetic valve makes this difficult to discern. There is a unknown size mechanical valve present in the mitral position. MV peak gradient, 10.4 mmHg. The mean mitral valve gradient is 8.0 mmHg. Tricuspid Valve: The tricuspid valve is normal in structure. Tricuspid valve regurgitation is moderate. Aortic Valve: The aortic valve is tricuspid. There is moderate calcification of the aortic valve. Venous: The inferior vena cava was not well visualized. Additional Comments: A device lead is visualized in the  right ventricle. Spectral Doppler performed. Color Doppler performed.  LEFT VENTRICLE PLAX 2D LVOT  diam:     2.70 cm LV SV:         55 LV SV Index:   26 LVOT Area:     5.73 cm  LV Volumes (MOD) LV vol d, MOD A2C: 177.0 ml LV vol d, MOD A4C: 155.0 ml LV vol s, MOD A2C: 148.0 ml LV vol s, MOD A4C: 119.0 ml LV SV MOD A2C:     29.0 ml LV SV MOD A4C:     155.0 ml LV SV MOD BP:      35.4 ml AORTIC VALVE LVOT Vmax:   79.60 cm/s LVOT Vmean:  47.500 cm/s LVOT VTI:    0.096 m MITRAL VALVE              TRICUSPID VALVE MV Area VTI:  2.19 cm    TR Peak grad:   19.0 mmHg MV Peak grad: 10.4 mmHg   TR Vmax:        218.00 cm/s MV Mean grad: 8.0 mmHg MV Vmax:      1.62 m/s    SHUNTS MV Vmean:     110.9 cm/s  Systemic VTI:  0.10 m                           Systemic Diam: 2.70 cm Dalton McleanMD Electronically signed by Wilfred Lacy Signature Date/Time: 04/07/2023/2:14:27 PM    Final    DG Chest Portable 1 View  Result Date: 04/06/2023 CLINICAL DATA:  Shortness of breath. EXAM: PORTABLE CHEST 1 VIEW COMPARISON:  March 23, 2023. FINDINGS: Stable cardiomediastinal silhouette. Right-sided defibrillator is again noted. Mild central pulmonary vascular congestion is noted with possible bibasilar atelectasis or edema. Degenerative changes seen involving both shoulder joints. Status post mitral valve repair. IMPRESSION: Mild central pulmonary vascular congestion is noted with possible bibasilar atelectasis or edema. Electronically Signed   By: Lupita Raider M.D.   On: 04/06/2023 11:42    Assessment/Plan  1. Acute on chronic systolic heart failure (HCC) Lungs clear  Low salt diet  Cont with torsemide 40 mg bid Montor bp  Daily weights Pt has appt with cardiology in 2 weeks   2. Stage 3b chronic kidney disease (HCC)      Latest Ref Rng & Units 04/22/2023    5:40 AM 04/21/2023    5:25 AM 04/20/2023    5:15 AM  BMP  Glucose 70 - 99 mg/dL 90  161  096   BUN 8 - 23 mg/dL 32  33  37   Creatinine 0.61 - 1.24 mg/dL 0.45   4.09  8.11   Sodium 135 - 145 mmol/L 132  133  132   Potassium 3.5 - 5.1 mmol/L 3.4  3.5  3.5   Chloride 98 - 111 mmol/L 94  93  91   CO2 22 - 32 mmol/L 31  31  28    Calcium 8.9 - 10.3 mg/dL 7.9  8.3  8.6    Avoid nephrotoxic meds Will check bmp in a week  3. H/O mitral valve replacement with mechanical valve On coumadin  No signs of bleeding  INR weekly   4. Infection and inflammatory reaction due to cardiac device, implant, and graft, sequela JP drain removed in the hospital  Incision site with no gaping, no drainage, minimal erythema No tenderness on palpation  Cont with cipro  Instructed staff to schedule appt with ID   5. OSA on CPAP Cont with CPAP  6. Hypothyroidism due to  medication Cont with synthyroid   7. Primary insomnia Spoke with nursing staff and instructed them to check early and not in the middle of the night Cont with melatonin  Will start low dose rameron   8. Atrial fibrillation, unspecified type (HCC) Rate controlled Cont with amiodarone, quinidine  Other orders - mirtazapine (REMERON) 7.5 MG tablet; Take 1 tablet (7.5 mg total) by mouth at bedtime.    Family/ staff Communication: spoke with nurse and gave the orders  Labs/tests ordered:bmp next week   I spent greater than 50  minutes for the care of this patient in face to face time, chart review, clinical documentation, patient education.      HUB

## 2023-04-26 ENCOUNTER — Ambulatory Visit (INDEPENDENT_AMBULATORY_CARE_PROVIDER_SITE_OTHER): Payer: Medicare Other | Admitting: *Deleted

## 2023-04-26 ENCOUNTER — Telehealth: Payer: Self-pay | Admitting: *Deleted

## 2023-04-26 DIAGNOSIS — Z5181 Encounter for therapeutic drug level monitoring: Secondary | ICD-10-CM

## 2023-04-26 DIAGNOSIS — I4891 Unspecified atrial fibrillation: Secondary | ICD-10-CM | POA: Diagnosis not present

## 2023-04-26 LAB — PROTIME-INR
INR: 3 — AB (ref 0.80–1.20)
Protime: 29.4 — AB (ref 10.0–13.8)

## 2023-04-26 NOTE — Telephone Encounter (Addendum)
Called pt since INR is due; there was no answer so left a message.However, as I was leaving the message a male picked up and said hello and then hung up the phone. Unsure if the message went through so call back and there was no answer so left a brief message to call back regarding INR and to call our direct number.   Pt returned the call and stated he does not has his home machine and that they drew some blood this am but unsure what it was. He states he is in [facility] in the Rehab in [place] Room 23 to help with finding him. Advised will call them to find out and he asked if I could update him advised I will once I find out.   Clydie Braun at [facility] states they drew labs and will fax over. Rehab in [place] Rm 23, updated pt that they drew the labs & awaiting a fax for the results. He was thankful & is aware I will follow up once received.

## 2023-04-27 ENCOUNTER — Encounter: Payer: Self-pay | Admitting: Nurse Practitioner

## 2023-04-27 ENCOUNTER — Non-Acute Institutional Stay (SKILLED_NURSING_FACILITY): Payer: Medicare Other | Admitting: Nurse Practitioner

## 2023-04-27 DIAGNOSIS — N189 Chronic kidney disease, unspecified: Secondary | ICD-10-CM

## 2023-04-27 DIAGNOSIS — N1832 Chronic kidney disease, stage 3b: Secondary | ICD-10-CM | POA: Diagnosis not present

## 2023-04-27 DIAGNOSIS — E032 Hypothyroidism due to medicaments and other exogenous substances: Secondary | ICD-10-CM | POA: Diagnosis not present

## 2023-04-27 DIAGNOSIS — I4891 Unspecified atrial fibrillation: Secondary | ICD-10-CM

## 2023-04-27 DIAGNOSIS — E871 Hypo-osmolality and hyponatremia: Secondary | ICD-10-CM | POA: Diagnosis not present

## 2023-04-27 DIAGNOSIS — Z95810 Presence of automatic (implantable) cardiac defibrillator: Secondary | ICD-10-CM

## 2023-04-27 DIAGNOSIS — G4700 Insomnia, unspecified: Secondary | ICD-10-CM | POA: Insufficient documentation

## 2023-04-27 DIAGNOSIS — I472 Ventricular tachycardia, unspecified: Secondary | ICD-10-CM | POA: Diagnosis not present

## 2023-04-27 DIAGNOSIS — F5101 Primary insomnia: Secondary | ICD-10-CM | POA: Diagnosis not present

## 2023-04-27 DIAGNOSIS — I5043 Acute on chronic combined systolic (congestive) and diastolic (congestive) heart failure: Secondary | ICD-10-CM

## 2023-04-27 DIAGNOSIS — G4733 Obstructive sleep apnea (adult) (pediatric): Secondary | ICD-10-CM

## 2023-04-27 DIAGNOSIS — Z5181 Encounter for therapeutic drug level monitoring: Secondary | ICD-10-CM | POA: Diagnosis not present

## 2023-04-27 DIAGNOSIS — Z7901 Long term (current) use of anticoagulants: Secondary | ICD-10-CM

## 2023-04-27 DIAGNOSIS — D631 Anemia in chronic kidney disease: Secondary | ICD-10-CM

## 2023-04-27 NOTE — Assessment & Plan Note (Signed)
Heart rate is in control, continue Amiodarone.  

## 2023-04-27 NOTE — Assessment & Plan Note (Addendum)
Hgb 9.4 04/22/23, obtain CBC/diff, Iron, Ferritin, Vit B12, Folate, FOBT x3

## 2023-04-27 NOTE — Assessment & Plan Note (Signed)
Bun/creat 32/3.12 04/22/23, f/u CMP/eGFR

## 2023-04-27 NOTE — Assessment & Plan Note (Addendum)
Na 132 04/22/23, update CMP/eGFR

## 2023-04-27 NOTE — Assessment & Plan Note (Signed)
EF 20%, chronic edema BLE, on Torsemide, f/u cardiology, vascular specialist, device specialist.

## 2023-04-27 NOTE — Assessment & Plan Note (Signed)
mechanical mitral valve(Hx of MV endocarditis), on Coumadin, goal of INR 2.5-3.5, last INR 3.0 04/26/23, PT/INR one week.

## 2023-04-27 NOTE — Patient Instructions (Signed)
Description   Hospital d/c pt on warfarin 2.5mg  daily 04/27/23-spoke with Clydie Braun at [facility] and advised pt to continue warfarin 2.5mg  daily. Recheck INR in 1 week per [facility] protocol since pt is SNF and they have an MD that manages while there, advised to call once discharged.   Previous dose: warfarin 1/2 tablet daily except for 1 tablet on Mondays, Wednesdays, and Fridays. Coumadin Clinic [phone].  AMIO DOSE 200MG  DAILY, 12/07/22 12/08/22-Amio 200mg  qd none Wed/Sun   Pt's cell phone [phone] in case difficulty reaching pt on home phone.

## 2023-04-27 NOTE — Progress Notes (Signed)
Location:   SNF [facility] Room Number: 60 Place of Service:  SNF (31)  Provider: Chipper Oman NP  PCP: [name], MD Patient Care Team: [name], MD as PCP - General (Internal Medicine) Marinus Maw, MD as PCP - Electrophysiology (Cardiology)  Extended Emergency Contact Information Primary Emergency Contact: [facility] Phone: [phone] Relation: Daughter Preferred language: [place] Secondary Emergency Contact: Raymondo Band Address: [address]          [place], [place] [address] [name] Home Phone: [phone] Mobile Phone: [phone] Relation: Spouse  Code Status: DNR Goals of care:  Advanced Directive information    04/06/2023    3:00 PM  Advanced Directives  Does Patient Have a Medical Advance Directive? Yes  Type of Advance Directive Healthcare Power of Attorney  Does patient want to make changes to medical advance directive? No - Patient declined  Copy of Healthcare Power of Attorney in Chart? No - copy requested     No Known Allergies  Chief Complaint  Patient presents with  . Acute Visit    Discharge to [place]     HPI:  82 y.o. male  with medical history significant of Afib, VT, CHB with CRT-D implantation, CKD, OSA, Hypothyroidism, insomnia, OSA, chronic anticoagulation, s/p mechanical MV admitted to [facility] for recovering and therapy following hospital stay.   Hospitalized 04/06/23-04/22/23 for VT with ICD shocks(failed ablation), acute on chronic systolic heart failure, AKI on CKD, left upper chest wall hematoma post baro stimulator(03/19/23, s/p I&D)  complication s/p removal(04/12/23), wound infection, treated with Cipro, Augmentin, left upper chest staples intact, JP drain until f/u vascular surgery.    The patient has gained some physical strength, ALD function, improving SOB, swelling BLE, left upper chest hematoma, s/p I+D, wound infection, JP drain has been under skilled nursing care and treated with  antibiotics recommended by ID. The patient is planing discharge home [place]  Hyponatremia, Na 132 04/22/23  CKD Bun/creat 32/3.12 04/22/23  Anemia, Hgb 9.4 04/22/23  Anticoagulation, mechanical mitral valve(Hx of MV endocarditis), on Coumadin, goal of INR 2.5-3.5, last INR 3.0 04/26/23  OSA on CPAP  Afib, on Amiodarone  CHB s/p CRT-D  VT, on Quinidine, Amiodarone.   Hypothyroidism, on Levothyroxine-unable to location dose adjustment,  TSH 18.44 04/06/23  Insomnia, taking Mirtazapine  CHF EF 20%, chronic edema BLE, on Torsemide        Past Medical History:  Diagnosis Date  . AICD (automatic cardioverter/defibrillator) present   . Atrial fibrillation (HCC)   . 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 CHF (congestive heart failure) (HCC)   . 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  . Conductive hearing loss    uses hearing aids  . Dysrhythmia    A-fib  . History of blood transfusion 03/24/2023  . Hypothyroidism   . Intraspinal abscess   . 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 Nonischemic cardiomyopathy (HCC)   . Psychosexual dysfunction with inhibited sexual excitement   . S/P mitral valve replacement   . Syncope and collapse   . Unspecified sleep apnea    last sleep study 11/07, uses cpap  . Ventricular tachycardia (HCC)   . VT (ventricular tachycardia) (HCC)     Past Surgical History:  Procedure Laterality Date  . BIV ICD GENERATOR CHANGEOUT N/A 05/04/2017   Procedure: BiV ICD Musician;  Surgeon:  Marinus Maw, MD;  Location: [facility] INVASIVE CV LAB;  Service: Cardiovascular;  Laterality: N/A;  . CARDIAC CATHETERIZATION  04/24/2002  . CARDIAC VALVE REPLACEMENT    . CARDIOVERSION  03/09/2012   Procedure: CARDIOVERSION;  Surgeon: Marinus Maw, MD;  Location: [facility] ENDOSCOPY;  Service: Cardiovascular;  Laterality: N/A;  . CARDIOVERSION N/A 11/22/2013    Procedure: CARDIOVERSION;  Surgeon: Thurmon Fair, MD;  Location: [facility] ENDOSCOPY;  Service: Cardiovascular;  Laterality: N/A;  . dental implants    . ELECTROPHYSIOLOGIC STUDY N/A 09/15/2015   Procedure: V Tach Ablation;  Surgeon: Marinus Maw, MD;  Location: [facility] INVASIVE CV LAB;  Service: Cardiovascular;  Laterality: N/A;  . Evacution of epidural lumbar epidural abscess  07/26/1997  . INCISION AND DRAINAGE OF WOUND Left 03/25/2023   Procedure: Washout of Chest Wall Hematoma;  Surgeon: Nada Libman, MD;  Location: [facility] OR;  Service: Vascular;  Laterality: Left;  . INCISION AND DRAINAGE OF WOUND N/A 03/29/2023   Procedure: IRRIGATION AND DEBRIDEMENT  CHEST WALL;  Surgeon: Nada Libman, MD;  Location: [facility] OR;  Service: Vascular;  Laterality: N/A;  . INSERT / REPLACE / REMOVE PACEMAKER  11/23/2008  . MITRAL VALVE REPLACEMENT     w #[id number] [facility] [facility]  . PERMANENT PACEMAKER INSERTION N/A 08/05/2011   Procedure: PERMANENT PACEMAKER INSERTION;  Surgeon: Marinus Maw, MD;  Location: [facility] CATH LAB;  Service: Cardiovascular;  Laterality: N/A;  . REMOVAL OF BAROSTIM N/A 04/12/2023   Procedure: REMOVAL OF [name];  Surgeon: Nada Libman, MD;  Location: [facility] OR;  Service: Vascular;  Laterality: N/A;  . RIGHT HEART CATH N/A 02/26/2021   Procedure: RIGHT HEART CATH;  Surgeon: Laurey Morale, MD;  Location: [facility] INVASIVE CV LAB;  Service: Cardiovascular;  Laterality: N/A;  . THYROIDECTOMY    . TONSILLECTOMY    . V-TACH ABLATION  07/30/2021   in [place]  . VALVE REPLACEMENT  07/26/1998      reports that he has never smoked. He has never used smokeless tobacco. He reports that he does not currently use alcohol. He reports that he does not use drugs. Social History   Socioeconomic History  . Marital status: Married    Spouse name: Not on file  . Number of children: 1  . Years of education: 96  . Highest education level: Master's degree (e.g., MA, MS, MEng, MEd, MSW, MBA)  Occupational History  .  Occupation: retired from Consulting civil engineer  Tobacco Use  . Smoking status: Never  . Smokeless tobacco: Never  Vaping Use  . Vaping status: Never Used  Substance and Sexual Activity  . Alcohol use: Not Currently    Comment: occasionally  . Drug use: No  . Sexual activity: Not Currently  Other Topics Concern  . Not on file  Social History Narrative   Lives with wife in a one story home.  Has one daughter.  Retired.  Education: Masters.   Social Determinants of Health   Financial Resource Strain: Low Risk  (03/08/2023)   Overall Financial Resource Strain (CARDIA)   . Difficulty of Paying Living Expenses: Not hard at all  Food Insecurity: No Food Insecurity (04/06/2023)   Hunger Vital Sign   . Worried About Programme researcher, broadcasting/film/video in the Last Year: Never true   . Ran Out of Food in the Last Year: Never true  Transportation Needs: No Transportation Needs (04/06/2023)   PRAPARE - Transportation   . Lack of Transportation (Medical): No   . Lack  of Transportation (Non-Medical): No  Physical Activity: Insufficiently Active (03/08/2023)   Exercise Vital Sign   . Days of Exercise per Week: 4 days   . Minutes of Exercise per Session: 30 min  Stress: No Stress Concern Present (03/08/2023)   [employer] of Occupational Health - Occupational Stress Questionnaire   . Feeling of Stress : Not at all  Social Connections: Moderately Integrated (03/08/2023)   Social Connection and Isolation Panel [NHANES]   . Frequency of Communication with Friends and Family: More than three times a week   . Frequency of Social Gatherings with Friends and Family: Twice a week   . Attends Religious Services: More than 4 times per year   . Active Member of Clubs or Organizations: No   . Attends Banker Meetings: Never   . Marital Status: Married  Catering manager Violence: Not At Risk (04/06/2023)   Humiliation, Afraid, Rape, and Kick questionnaire   . Fear of Current or Ex-Partner: No   .  Emotionally Abused: No   . Physically Abused: No   . Sexually Abused: No   Functional Status Survey:    No Known Allergies  Pertinent  Health Maintenance Due  Topic Date Due  . INFLUENZA VACCINE  02/24/2023    Medications: Allergies as of 04/27/2023   No Known Allergies      Medication List        Accurate as of April 27, 2023  4:27 PM. If you have any questions, ask your nurse or doctor.          acetaminophen 500 MG tablet Commonly known as: TYLENOL Take 1,000 mg by mouth every 8 (eight) hours as needed for headache, fever or mild pain.   amiodarone 200 MG tablet Commonly known as: PACERONE Take amio 400 mg twice a day   amoxicillin 500 MG capsule Commonly known as: AMOXIL TAKE 4 CAPSULES BY MOUTH 30 MINUTES PRIOR TO DENTAL WORK   ciprofloxacin 500 MG tablet Commonly known as: CIPRO Take 1 tablet (500 mg total) by mouth daily with breakfast.   levothyroxine 88 MCG tablet Commonly known as: SYNTHROID TAKE 1 TABLET BY MOUTH EVERY DAY   melatonin 5 MG Tabs Take 5 mg by mouth at bedtime.   mirtazapine 7.5 MG tablet Commonly known as: REMERON Take 1 tablet (7.5 mg total) by mouth at bedtime.   multivitamin with minerals Tabs tablet Take 1 tablet by mouth in the morning.   mupirocin ointment 2 % Commonly known as: BACTROBAN Apply 1 Application topically daily. What changed:  when to take this reasons to take this   nitroGLYCERIN 0.4 MG SL tablet Commonly known as: NITROSTAT Place 1 tablet (0.4 mg total) under the tongue every 5 (five) minutes x 3 doses as needed for chest pain.   quiniDINE gluconate 324 MG CR tablet TAKE 1 TABLET BY MOUTH 2 TIMES DAILY.   torsemide 20 MG tablet Commonly known as: DEMADEX Take 2 tablets (40 mg total) by mouth 2 (two) times daily.   warfarin 2.5 MG tablet Commonly known as: COUMADIN Take as directed by the anticoagulation clinic. If you are unsure how to take this medication, talk to your nurse or  doctor. Original instructions: Take 1 tablet (2.5 mg total) by mouth daily at 4 PM.        Review of Systems  Constitutional:  Negative for appetite change, fatigue and fever.  HENT:  Negative for congestion and trouble swallowing.   Eyes:  Negative for visual disturbance.  Respiratory:  Positive for shortness of breath. Negative for cough, chest tightness and wheezing.   Cardiovascular:  Positive for leg swelling. Negative for chest pain and palpitations.  Gastrointestinal:  Negative for abdominal pain and constipation.  Genitourinary:  Positive for frequency. Negative for dysuria and urgency.       Nocturnal urination x5-6 per night.   Musculoskeletal:  Positive for arthralgias and gait problem.  Skin:  Positive for wound.  Neurological:  Negative for speech difficulty, weakness and headaches.  Psychiatric/Behavioral:  Positive for sleep disturbance. Negative for confusion.     Vitals:   04/27/23 1322  BP: 110/71  Pulse: 85  Resp: 16  Temp: (!) 97.5 F (36.4 C)  SpO2: 95%  Weight: 193 lb 11.2 oz (87.9 kg)   Body mass index is 25.56 kg/m. Physical Exam Vitals and nursing note reviewed.  Constitutional:      Appearance: Normal appearance.  HENT:     Head: Normocephalic and atraumatic.     Nose: Nose normal.  Eyes:     Extraocular Movements: Extraocular movements intact.     Conjunctiva/sclera: Conjunctivae normal.     Pupils: Pupils are equal, round, and reactive to light.  Cardiovascular:     Rate and Rhythm: Normal rate and regular rhythm.     Comments: R upper chest pacemaker.  Abdominal:     General: Bowel sounds are normal.     Palpations: Abdomen is soft.     Tenderness: There is no abdominal tenderness.  Musculoskeletal:     Cervical back: Normal range of motion and neck supple.     Right lower leg: Edema present.     Left lower leg: Edema present.     Comments: RLE 2+ edema, LLE 1+ edema  Skin:    General: Skin is warm and dry.     Comments:  Pigmented chronic venous insufficiency skin changes BLE, left upper chest staples remained intact, no s/s of infection. Swelling in the area present.   Neurological:     General: No focal deficit present.     Mental Status: He is alert and oriented to person, place, and time. Mental status is at baseline.     Motor: No weakness.     Gait: Gait abnormal.  Psychiatric:        Mood and Affect: Mood normal.        Behavior: Behavior normal.        Thought Content: Thought content normal.        Judgment: Judgment normal.    Labs reviewed: Basic Metabolic Panel: Recent Labs    04/10/23 1405 04/11/23 0434 04/12/23 0429 04/12/23 0431 04/18/23 0306 04/19/23 0330 04/20/23 0515 04/21/23 0525 04/22/23 0540  NA 130* 134*  --    < > 130*   < > 132* 133* 132*  K 4.0 3.7  --    < > 3.4*   < > 3.5 3.5 3.4*  CL 95* 96*  --    < > 91*   < > 91* 93* 94*  CO2 24 28  --    < > 27   < > 28 31 31   GLUCOSE 224* 132*  --    < > 166*   < > 101* 129* 90  BUN 35* 35*  --    < > 48*   < > 37* 33* 32*  CREATININE 2.28* 2.70*  --    < > 3.88*   < > 2.99* 3.05* 3.12*  CALCIUM 8.0* 8.3*  --    < >  8.2*   < > 8.6* 8.3* 7.9*  MG  --  1.9 2.2  --  2.4  --   --   --   --   PHOS 4.0  --   --   --   --   --   --   --   --    < > = values in this interval not displayed.   Liver Function Tests: Recent Labs    03/17/23 0753 03/24/23 0503 04/06/23 1030 04/10/23 1405  AST 38 25 30  --   ALT 28 20 24   --   ALKPHOS 79 63 78  --   BILITOT 1.0 0.6 1.4*  --   PROT 6.9 5.2* 5.9*  --   ALBUMIN 3.7 2.9* 3.2* 3.0*   No results for input(s): "LIPASE", "AMYLASE" in the last 8760 hours. No results for input(s): "AMMONIA" in the last 8760 hours. CBC: Recent Labs    09/27/22 1612 12/08/22 1612 03/23/23 1018 03/23/23 1223 04/06/23 1030 04/07/23 0247 04/17/23 0305 04/18/23 0306 04/21/23 0525  WBC 8.2   < > 9.6   < > 8.6   < > 7.5 7.0 7.7  NEUTROABS 6.4  --  7.8*  --  7.0  --   --   --   --   HGB 12.4*   < >  9.3*   < > 9.4*   < > 9.3* 9.3* 9.2*  HCT 37.5*   < > 28.4*   < > 31.2*   < > 29.7* 29.6* 30.3*  MCV 91.3   < > 94.7   < > 106.5*   < > 95.8 96.1 97.1  PLT 121.0*   < > 151   < > 146*   < > 93* 90* 104*   < > = values in this interval not displayed.   Cardiac Enzymes: No results for input(s): "CKTOTAL", "CKMB", "CKMBINDEX", "TROPONINI" in the last 8760 hours. BNP: Invalid input(s): "POCBNP" CBG: Recent Labs    04/19/23 1145  GLUCAP 107*    Procedures and Imaging Studies During Stay: [facility] Chest Port 1 View  Result Date: 04/09/2023 CLINICAL DATA:  PICC line placement EXAM: PORTABLE CHEST 1 VIEW COMPARISON:  04/06/2023 FINDINGS: Single frontal view of the chest demonstrates interval placement of a left-sided PICC, tip overlying superior vena cava. 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 Stable multi lead AICD and left-sided nerve stimulator. Cardiac silhouette is unchanged. Continued central vascular congestion, with patchy bibasilar consolidation right greater than left increased since prior study. No effusion or pneumothorax. No acute bony abnormalities. IMPRESSION: 1. Left-sided PICC as above. 2. Continued central vascular congestion, with progressive bibasilar consolidation favoring atelectasis. Electronically Signed   By: Sharlet Salina M.D.   On: 04/09/2023 17:49   [place] EKG SITE RITE  Result Date: 04/09/2023 If Site Rite image not attached, placement could not be confirmed due to current cardiac rhythm.  ECHOCARDIOGRAM LIMITED  Result Date: 04/07/2023    ECHOCARDIOGRAM LIMITED REPORT   Patient Name:   Arda Cesar Date of Exam: 04/07/2023 Medical Rec #:  [id number]              Height:       73.0 in Accession #:    [id number]             Weight:       200.8 lb Date of Birth:  [birth year]               BSA:  2.155 m Patient Age:    82 years               BP:           102/92 mmHg Patient Gender: M                      HR:           108 bpm. Exam  Location:  Inpatient Procedure: Limited Echo, Cardiac Doppler and Color Doppler Indications:    I25.5 Ischemic cardiomyopathy  History:        Patient has prior history of Echocardiogram examinations, most                 recent 09/28/2022. Cardiomyopathy and CHF, Defibrillator and                 Abnormal ECG, Arrythmias:Atrial Fibrillation, Atrial Flutter and                 VT; Signs/Symptoms:Dyspnea and Shortness of Breath.                  Mitral Valve: unknown size mechanical valve valve is present in                 the mitral position.  Sonographer:    Sheralyn Boatman RDCS Referring Phys: Dorthula Nettles  Sonographer Comments: Patient has wound vac in parasternal region. IMPRESSIONS  1. Left ventricular ejection fraction, by estimation, is 20%. The left ventricle has severely decreased function. The left ventricle demonstrates global hypokinesis. No LV thrombus noted. Indeterminant diastolic function.  2. Peak RV-RA gradient 19 mmHg. IVC not visualized. Right ventricular systolic function is mildly reduced. The right ventricular size is normal.  3. Mechanical mitral valve with mean gradient 8 mmHg, increased from prior. No mitral regurgitation noted though shadowing from prosthetic valve makes this difficult to discern.  4. Tricuspid valve regurgitation is moderate.  5. The aortic valve is tricuspid. There is moderate calcification of the aortic valve. Doppler evaluation of aortic valve not done.  6. Limited echo. FINDINGS  Left Ventricle: Left ventricular ejection fraction, by estimation, is 20%. The left ventricle has severely decreased function. The left ventricle demonstrates global hypokinesis. Left ventricular diastolic parameters are indeterminate. Right Ventricle: Peak RV-RA gradient 19 mmHg. IVC not visualized. The right ventricular size is normal. No increase in right ventricular wall thickness. Right ventricular systolic function is mildly reduced. Mitral Valve: Mechanical mitral valve with mean gradient  8 mmHg, increased from prior. No mitral regurgitation noted though shadowing from prosthetic valve makes this difficult to discern. There is a unknown size mechanical valve present in the mitral position. MV peak gradient, 10.4 mmHg. The mean mitral valve gradient is 8.0 mmHg. Tricuspid Valve: The tricuspid valve is normal in structure. Tricuspid valve regurgitation is moderate. Aortic Valve: The aortic valve is tricuspid. There is moderate calcification of the aortic valve. Venous: The inferior vena cava was not well visualized. Additional Comments: A device lead is visualized in the right ventricle. Spectral Doppler performed. Color Doppler performed.  LEFT VENTRICLE PLAX 2D LVOT diam:     2.70 cm LV SV:         55 LV SV Index:   26 LVOT Area:     5.73 cm  LV Volumes (MOD) LV vol d, MOD A2C: 177.0 ml LV vol d, MOD A4C: 155.0 ml LV vol s, MOD A2C: 148.0 ml LV vol s, MOD A4C: 119.0 ml LV  SV MOD A2C:     29.0 ml LV SV MOD A4C:     155.0 ml LV SV MOD BP:      35.4 ml AORTIC VALVE LVOT Vmax:   79.60 cm/s LVOT Vmean:  47.500 cm/s LVOT VTI:    0.096 m MITRAL VALVE              TRICUSPID VALVE MV Area VTI:  2.19 cm    TR Peak grad:   19.0 mmHg MV Peak grad: 10.4 mmHg   TR Vmax:        218.00 cm/s MV Mean grad: 8.0 mmHg MV Vmax:      1.62 m/s    SHUNTS MV Vmean:     110.9 cm/s  Systemic VTI:  0.10 m                           Systemic Diam: 2.70 cm Dalton McleanMD Electronically signed by Wilfred Lacy Signature Date/Time: 04/07/2023/2:14:27 PM    Final    DG Chest Portable 1 View  Result Date: 04/06/2023 CLINICAL DATA:  Shortness of breath. EXAM: PORTABLE CHEST 1 VIEW COMPARISON:  March 23, 2023. FINDINGS: Stable cardiomediastinal silhouette. Right-sided defibrillator is again noted. Mild central pulmonary vascular congestion is noted with possible bibasilar atelectasis or edema. Degenerative changes seen involving both shoulder joints. Status post mitral valve repair. IMPRESSION: Mild central pulmonary vascular  congestion is noted with possible bibasilar atelectasis or edema. Electronically Signed   By: Lupita Raider M.D.   On: 04/06/2023 11:42    Assessment/Plan:   Hyponatremia Na 132 04/22/23, update CMP/eGFR  Stage 3b chronic kidney disease (HCC) Bun/creat 32/3.12 04/22/23, f/u CMP/eGFR  Anemia Hgb 9.4 04/22/23, obtain CBC/diff, Iron, Ferritin, Vit B12, Folate, FOBT x3  Anticoagulation goal of INR 2.5 to 3.5 mechanical mitral valve(Hx of MV endocarditis), on Coumadin, goal of INR 2.5-3.5, last INR 3.0 04/26/23, PT/INR one week.   OSA on CPAP  on CPAP  Atrial fibrillation (HCC) Heart rate is in control, continue Amiodarone.   ICD (implantable cardioverter-defibrillator) in place CHB, s/p CRT-D  Ventricular tachycardia (HCC) Heart rate is in control, on Quinidine, Amiodarone.   Hypothyroidism due to medication on Levothyroxine-unable to location dose adjustment,  TSH 18.44 04/06/23, repeat TSH  Insomnia Partially due to nocturnal frequent urination, too soon to eval Mirtazapine.   Heart failure (HCC)  EF 20%, chronic edema BLE, on Torsemide, f/u cardiology, vascular specialist, device specialist.    Patient is being discharged with the following home health services:    Patient is being discharged with the following durable medical equipment:    Patient has been advised to f/u with their PCP in 1-2 weeks to bring them up to date on their rehab stay.  Social services at facility was responsible for arranging this appointment.  Pt was provided with a 30 day supply of prescriptions for medications and refills must be obtained from their PCP.  For controlled substances, a more limited supply may be provided adequate until PCP appointment only.  Future labs/tests needed:  CBC/diff, CMP/eGFR, TSH, Iron, Fe  Sat, Ferritin, Vit B12, Folate, FOBT x3

## 2023-04-27 NOTE — Assessment & Plan Note (Signed)
CHB, s/p CRT-D

## 2023-04-27 NOTE — Assessment & Plan Note (Signed)
on Levothyroxine-unable to location dose adjustment,  TSH 18.44 04/06/23, repeat TSH

## 2023-04-27 NOTE — Assessment & Plan Note (Signed)
Heart rate is in control, on Quinidine, Amiodarone.

## 2023-04-27 NOTE — Assessment & Plan Note (Signed)
on CPAP ?

## 2023-04-27 NOTE — Assessment & Plan Note (Signed)
Partially due to nocturnal frequent urination, too soon to eval Mirtazapine.

## 2023-04-28 LAB — BASIC METABOLIC PANEL
BUN: 35 — AB (ref 4–21)
CO2: 32 — AB (ref 13–22)
Chloride: 100 (ref 99–108)
Creatinine: 2.9 — AB (ref 0.6–1.3)
Glucose: 76
Potassium: 2.9 meq/L — AB (ref 3.5–5.1)
Sodium: 139 (ref 137–147)

## 2023-04-28 LAB — HEPATIC FUNCTION PANEL
ALT: 23 U/L (ref 10–40)
AST: 26 (ref 14–40)
Alkaline Phosphatase: 89 (ref 25–125)
Bilirubin, Total: 1.1

## 2023-04-28 LAB — IRON,TIBC AND FERRITIN PANEL
%SAT: 25
Ferritin: 164
Iron: 65
TIBC: 255

## 2023-04-28 LAB — CBC AND DIFFERENTIAL
HCT: 29 — AB (ref 41–53)
Hemoglobin: 8.9 — AB (ref 13.5–17.5)
Neutrophils Absolute: 8022
Platelets: 132 10*3/uL — AB (ref 150–400)
WBC: 9.7

## 2023-04-28 LAB — CBC: RBC: 2.93 — AB (ref 3.87–5.11)

## 2023-04-28 LAB — COMPREHENSIVE METABOLIC PANEL
Albumin: 3.1 — AB (ref 3.5–5.0)
Calcium: 8.1 — AB (ref 8.7–10.7)
Globulin: 2.2
eGFR: 21

## 2023-04-28 LAB — VITAMIN B12: Vitamin B-12: 452

## 2023-04-28 LAB — TSH: TSH: 17.85 — AB (ref 0.41–5.90)

## 2023-04-29 ENCOUNTER — Other Ambulatory Visit: Payer: Self-pay

## 2023-04-29 ENCOUNTER — Encounter (HOSPITAL_COMMUNITY): Payer: Self-pay | Admitting: Emergency Medicine

## 2023-04-29 ENCOUNTER — Emergency Department (HOSPITAL_COMMUNITY): Payer: Medicare Other

## 2023-04-29 ENCOUNTER — Telehealth: Payer: Self-pay

## 2023-04-29 ENCOUNTER — Encounter: Payer: Self-pay | Admitting: Sports Medicine

## 2023-04-29 ENCOUNTER — Inpatient Hospital Stay (HOSPITAL_COMMUNITY)
Admission: EM | Admit: 2023-04-29 | Discharge: 2023-05-02 | DRG: 920 | Disposition: A | Discharge status: Skilled Nursing Facility | Payer: Medicare Other | Source: Skilled Nursing Facility | Attending: Internal Medicine | Admitting: Internal Medicine

## 2023-04-29 ENCOUNTER — Non-Acute Institutional Stay (SKILLED_NURSING_FACILITY): Payer: Medicare Other | Admitting: Sports Medicine

## 2023-04-29 DIAGNOSIS — I4891 Unspecified atrial fibrillation: Secondary | ICD-10-CM | POA: Diagnosis not present

## 2023-04-29 DIAGNOSIS — E876 Hypokalemia: Secondary | ICD-10-CM | POA: Diagnosis present

## 2023-04-29 DIAGNOSIS — I97648 Postprocedural seroma of a circulatory system organ or structure following other circulatory system procedure: Secondary | ICD-10-CM | POA: Diagnosis present

## 2023-04-29 DIAGNOSIS — Z7901 Long term (current) use of anticoagulants: Secondary | ICD-10-CM

## 2023-04-29 DIAGNOSIS — I482 Chronic atrial fibrillation, unspecified: Secondary | ICD-10-CM | POA: Diagnosis present

## 2023-04-29 DIAGNOSIS — M96843 Postprocedural seroma of a musculoskeletal structure following other procedure: Secondary | ICD-10-CM | POA: Diagnosis not present

## 2023-04-29 DIAGNOSIS — N184 Chronic kidney disease, stage 4 (severe): Secondary | ICD-10-CM | POA: Diagnosis present

## 2023-04-29 DIAGNOSIS — T8141XA Infection following a procedure, superficial incisional surgical site, initial encounter: Secondary | ICD-10-CM | POA: Diagnosis not present

## 2023-04-29 DIAGNOSIS — H902 Conductive hearing loss, unspecified: Secondary | ICD-10-CM | POA: Diagnosis present

## 2023-04-29 DIAGNOSIS — R195 Other fecal abnormalities: Secondary | ICD-10-CM

## 2023-04-29 DIAGNOSIS — R918 Other nonspecific abnormal finding of lung field: Secondary | ICD-10-CM | POA: Diagnosis not present

## 2023-04-29 DIAGNOSIS — Z95810 Presence of automatic (implantable) cardiac defibrillator: Secondary | ICD-10-CM | POA: Diagnosis not present

## 2023-04-29 DIAGNOSIS — Z79899 Other long term (current) drug therapy: Secondary | ICD-10-CM

## 2023-04-29 DIAGNOSIS — I5022 Chronic systolic (congestive) heart failure: Secondary | ICD-10-CM | POA: Diagnosis present

## 2023-04-29 DIAGNOSIS — Y838 Other surgical procedures as the cause of abnormal reaction of the patient, or of later complication, without mention of misadventure at the time of the procedure: Secondary | ICD-10-CM | POA: Diagnosis present

## 2023-04-29 DIAGNOSIS — G4733 Obstructive sleep apnea (adult) (pediatric): Secondary | ICD-10-CM | POA: Diagnosis not present

## 2023-04-29 DIAGNOSIS — N1832 Chronic kidney disease, stage 3b: Secondary | ICD-10-CM | POA: Diagnosis present

## 2023-04-29 DIAGNOSIS — E039 Hypothyroidism, unspecified: Secondary | ICD-10-CM | POA: Diagnosis not present

## 2023-04-29 DIAGNOSIS — I428 Other cardiomyopathies: Secondary | ICD-10-CM | POA: Diagnosis not present

## 2023-04-29 DIAGNOSIS — D631 Anemia in chronic kidney disease: Secondary | ICD-10-CM | POA: Diagnosis present

## 2023-04-29 DIAGNOSIS — Z952 Presence of prosthetic heart valve: Secondary | ICD-10-CM

## 2023-04-29 DIAGNOSIS — T888XXS Other specified complications of surgical and medical care, not elsewhere classified, sequela: Secondary | ICD-10-CM

## 2023-04-29 DIAGNOSIS — K649 Unspecified hemorrhoids: Secondary | ICD-10-CM | POA: Diagnosis present

## 2023-04-29 DIAGNOSIS — Z8249 Family history of ischemic heart disease and other diseases of the circulatory system: Secondary | ICD-10-CM | POA: Diagnosis not present

## 2023-04-29 DIAGNOSIS — L7634 Postprocedural seroma of skin and subcutaneous tissue following other procedure: Principal | ICD-10-CM | POA: Diagnosis present

## 2023-04-29 DIAGNOSIS — Z743 Need for continuous supervision: Secondary | ICD-10-CM | POA: Diagnosis not present

## 2023-04-29 DIAGNOSIS — Z79890 Hormone replacement therapy (postmenopausal): Secondary | ICD-10-CM

## 2023-04-29 DIAGNOSIS — K8689 Other specified diseases of pancreas: Secondary | ICD-10-CM | POA: Diagnosis not present

## 2023-04-29 DIAGNOSIS — R609 Edema, unspecified: Secondary | ICD-10-CM | POA: Diagnosis not present

## 2023-04-29 LAB — CBC WITH DIFFERENTIAL/PLATELET
Abs Immature Granulocytes: 0.15 10*3/uL — ABNORMAL HIGH (ref 0.00–0.07)
Basophils Absolute: 0.1 10*3/uL (ref 0.0–0.1)
Basophils Relative: 1 %
Eosinophils Absolute: 0.3 10*3/uL (ref 0.0–0.5)
Eosinophils Relative: 2 %
HCT: 29.9 % — ABNORMAL LOW (ref 39.0–52.0)
Hemoglobin: 9.3 g/dL — ABNORMAL LOW (ref 13.0–17.0)
Immature Granulocytes: 1 %
Lymphocytes Relative: 4 %
Lymphs Abs: 0.5 10*3/uL — ABNORMAL LOW (ref 0.7–4.0)
MCH: 30.8 pg (ref 26.0–34.0)
MCHC: 31.1 g/dL (ref 30.0–36.0)
MCV: 99 fL (ref 80.0–100.0)
Monocytes Absolute: 1 10*3/uL (ref 0.1–1.0)
Monocytes Relative: 9 %
Neutro Abs: 9.7 10*3/uL — ABNORMAL HIGH (ref 1.7–7.7)
Neutrophils Relative %: 83 %
Platelets: DECREASED 10*3/uL (ref 150–400)
RBC: 3.02 MIL/uL — ABNORMAL LOW (ref 4.22–5.81)
RDW: 17.8 % — ABNORMAL HIGH (ref 11.5–15.5)
WBC: 11.7 10*3/uL — ABNORMAL HIGH (ref 4.0–10.5)
nRBC: 0 % (ref 0.0–0.2)

## 2023-04-29 LAB — COMPREHENSIVE METABOLIC PANEL
ALT: 26 U/L (ref 0–44)
AST: 49 U/L — ABNORMAL HIGH (ref 15–41)
Albumin: 2.8 g/dL — ABNORMAL LOW (ref 3.5–5.0)
Alkaline Phosphatase: 83 U/L (ref 38–126)
Anion gap: 10 (ref 5–15)
BUN: 37 mg/dL — ABNORMAL HIGH (ref 8–23)
CO2: 30 mmol/L (ref 22–32)
Calcium: 7.8 mg/dL — ABNORMAL LOW (ref 8.9–10.3)
Chloride: 94 mmol/L — ABNORMAL LOW (ref 98–111)
Creatinine, Ser: 2.83 mg/dL — ABNORMAL HIGH (ref 0.61–1.24)
GFR, Estimated: 22 mL/min — ABNORMAL LOW (ref >60)
Glucose, Bld: 94 mg/dL (ref 70–99)
Potassium: 3.4 mmol/L — ABNORMAL LOW (ref 3.5–5.1)
Sodium: 134 mmol/L — ABNORMAL LOW (ref 135–145)
Total Bilirubin: 0.8 mg/dL (ref 0.3–1.2)
Total Protein: 5.3 g/dL — ABNORMAL LOW (ref 6.5–8.1)

## 2023-04-29 LAB — BRAIN NATRIURETIC PEPTIDE: B Natriuretic Peptide: 1194.1 pg/mL — ABNORMAL HIGH (ref 0.0–100.0)

## 2023-04-29 LAB — PROTIME-INR
INR: 2.8 — ABNORMAL HIGH (ref 0.8–1.2)
Prothrombin Time: 29.4 s — ABNORMAL HIGH (ref 11.4–15.2)

## 2023-04-29 MED ORDER — ONDANSETRON HCL 4 MG/2ML IJ SOLN: 4.0000 mg | Freq: Four times a day (QID) | INTRAMUSCULAR | Status: DC | PRN

## 2023-04-29 MED ORDER — LEVOTHYROXINE SODIUM 88 MCG PO TABS
88.0000 ug | ORAL_TABLET | Freq: Every day | ORAL | Status: DC
  Administered 2023-04-30 – 2023-05-02 (×3): 88 ug via ORAL
  Filled 2023-04-29 (×3): qty 1

## 2023-04-29 MED ORDER — QUINIDINE GLUCONATE ER 324 MG PO TBCR
324.0000 mg | EXTENDED_RELEASE_TABLET | Freq: Two times a day (BID) | ORAL | Status: DC
  Filled 2023-04-29 (×8): qty 1

## 2023-04-29 MED ORDER — AMIODARONE HCL 200 MG PO TABS
400.0000 mg | ORAL_TABLET | Freq: Two times a day (BID) | ORAL | Status: DC
  Administered 2023-04-29 – 2023-05-02 (×6): 400 mg via ORAL
  Filled 2023-04-29 (×6): qty 2

## 2023-04-29 MED ORDER — PROCHLORPERAZINE EDISYLATE 10 MG/2ML IJ SOLN: 5.0000 mg | INTRAMUSCULAR | Status: DC | PRN

## 2023-04-29 MED ORDER — TORSEMIDE 20 MG PO TABS
40.0000 mg | ORAL_TABLET | Freq: Two times a day (BID) | ORAL | Status: DC
  Administered 2023-04-30 – 2023-05-02 (×5): 40 mg via ORAL
  Filled 2023-04-29 (×5): qty 2

## 2023-04-29 MED ORDER — ACETAMINOPHEN 325 MG PO TABS
650.0000 mg | ORAL_TABLET | Freq: Four times a day (QID) | ORAL | Status: DC | PRN
  Administered 2023-04-30: 650 mg via ORAL
  Filled 2023-04-29: qty 2

## 2023-04-29 MED ORDER — NITROGLYCERIN 0.4 MG SL SUBL: 0.4000 mg | SUBLINGUAL_TABLET | SUBLINGUAL | Status: DC | PRN

## 2023-04-29 MED ORDER — MELATONIN 5 MG PO TABS
5.0000 mg | ORAL_TABLET | Freq: Every day | ORAL | Status: DC
  Administered 2023-04-29 – 2023-05-01 (×3): 5 mg via ORAL
  Filled 2023-04-29 (×3): qty 1

## 2023-04-29 MED ORDER — ONDANSETRON HCL 4 MG PO TABS: 4.0000 mg | ORAL_TABLET | Freq: Four times a day (QID) | ORAL | Status: DC | PRN

## 2023-04-29 MED ORDER — ACETAMINOPHEN 650 MG RE SUPP: 650.0000 mg | Freq: Four times a day (QID) | RECTAL | Status: DC | PRN

## 2023-04-29 MED ORDER — SENNOSIDES-DOCUSATE SODIUM 8.6-50 MG PO TABS
1.0000 | ORAL_TABLET | Freq: Every evening | ORAL | Status: DC | PRN
  Administered 2023-05-01: 1 via ORAL
  Filled 2023-04-29: qty 1

## 2023-04-29 NOTE — ED Provider Notes (Signed)
[place] EMERGENCY DEPARTMENT AT [facility] Provider Note   CSN: [id number] Arrival date & time: 04/29/23  1511     History  No chief complaint on file.   Austin [name] is a 82 y.o. male.  HPI 82 year old male presents with left chest swelling.  He has been having swelling progressively worsening for the last 3 days at his facility.  About 2 weeks ago he had a surgery by [name] to remove his left sided Barostim device. He was discharged on antibiotics for an infection of that site.  The JP drain was removed 1 week ago.  He has had progressive swelling without pain or drainage over these last few days.  No fevers.  No shortness of breath.  He feels like his fluid status is a lot better.  Of note, he tells me that the facility tested his stool and found occult blood.  He has not seen any black or bloody stools.  He states they tested it 3 times and 2 of them were positive and 1 was negative. He is on warfarin. However, the MD note from the facility notes he had hemoccult positive and dark stools.   Home Medications Prior to Admission medications   Medication Sig Start Date End Date Taking? Authorizing Provider  acetaminophen (TYLENOL) 500 MG tablet Take 1,000 mg by mouth every 8 (eight) hours as needed for headache, fever or mild pain.   Yes [provider]  amiodarone (PACERONE) 200 MG tablet Take amio 400 mg twice a day 04/22/23  Yes Clegg, Amy D, NP  ciprofloxacin (CIPRO) 500 MG tablet Take 1 tablet (500 mg total) by mouth daily with breakfast. 04/23/23  Yes Clegg, Amy D, NP  levothyroxine (SYNTHROID) 88 MCG tablet TAKE 1 TABLET BY MOUTH EVERY DAY 03/23/23  Yes [name], MD  melatonin 5 MG TABS Take 5 mg by mouth at bedtime.   Yes [provider]  mirtazapine (REMERON) 7.5 MG tablet Take 1 tablet (7.5 mg total) by mouth at bedtime. 04/25/23  Yes Venita Sheffield, MD  Multiple Vitamin (MULTIVITAMIN WITH MINERALS) TABS tablet Take 1  tablet by mouth in the morning.   Yes [provider]  mupirocin ointment (BACTROBAN) 2 % Apply 1 Application topically daily. 03/01/23  Yes McDonald, Rachelle Hora, DPM  nitroGLYCERIN (NITROSTAT) 0.4 MG SL tablet Place 1 tablet (0.4 mg total) under the tongue every 5 (five) minutes x 3 doses as needed for chest pain. 10/14/18  Yes Kilroy, Luke K, PA-C  quiniDINE gluconate 324 MG CR tablet TAKE 1 TABLET BY MOUTH 2 TIMES DAILY. 01/25/23  Yes Laurey Morale, MD  torsemide (DEMADEX) 20 MG tablet Take 2 tablets (40 mg total) by mouth 2 (two) times daily. 04/22/23  Yes Clegg, Amy D, NP  warfarin (COUMADIN) 2.5 MG tablet Take 1 tablet (2.5 mg total) by mouth daily at 4 PM. 04/22/23  Yes Clegg, Amy D, NP      Allergies    Patient has no known allergies.    Review of Systems   Review of Systems  Constitutional:  Negative for fever.  Respiratory:  Negative for shortness of breath.   Cardiovascular:  Negative for chest pain.  Gastrointestinal:  Positive for blood in stool.    Physical Exam Updated Vital Signs BP 101/68   Pulse 84   Temp (!) 97.3 F (36.3 C) (Oral)   Resp 14   SpO2 94%  Physical Exam Vitals and nursing note reviewed.  Constitutional:  Appearance: He is well-developed.  HENT:     Head: Normocephalic and atraumatic.  Cardiovascular:     Rate and Rhythm: Normal rate and regular rhythm.     Heart sounds: Normal heart sounds.  Pulmonary:     Effort: Pulmonary effort is normal.     Breath sounds: Normal breath sounds.  Chest:    Abdominal:     Palpations: Abdomen is soft.     Tenderness: There is no abdominal tenderness.  Musculoskeletal:     Right lower leg: Edema present.     Left lower leg: Edema present.     Comments: Pitting edema and weeping to BLE.  Skin:    General: Skin is warm and dry.  Neurological:     Mental Status: He is alert.        ED Results / Procedures / Treatments   Labs (all labs ordered are listed, but only abnormal results are  displayed) Labs Reviewed  BRAIN NATRIURETIC PEPTIDE - Abnormal; Notable for the following components:      Result Value   B Natriuretic Peptide 1,194.1 (*)    All other components within normal limits  CBC WITH DIFFERENTIAL/PLATELET - Abnormal; Notable for the following components:   WBC 11.7 (*)    RBC 3.02 (*)    Hemoglobin 9.3 (*)    HCT 29.9 (*)    RDW 17.8 (*)    Neutro Abs 9.7 (*)    Lymphs Abs 0.5 (*)    Abs Immature Granulocytes 0.15 (*)    All other components within normal limits  PROTIME-INR - Abnormal; Notable for the following components:   Prothrombin Time 29.4 (*)    INR 2.8 (*)    All other components within normal limits  COMPREHENSIVE METABOLIC PANEL - Abnormal; Notable for the following components:   Sodium 134 (*)    Potassium 3.4 (*)    Chloride 94 (*)    BUN 37 (*)    Creatinine, Ser 2.83 (*)    Calcium 7.8 (*)    Total Protein 5.3 (*)    Albumin 2.8 (*)    AST 49 (*)    GFR, Estimated 22 (*)    All other components within normal limits  BASIC METABOLIC PANEL  MAGNESIUM  CBC WITH DIFFERENTIAL/PLATELET  PROTIME-INR    EKG None  Radiology CT Chest Wo Contrast  Result Date: 04/29/2023 CLINICAL DATA:  Postoperative swelling. Recent removal of marrow stem device. JP drain removed 4 days ago. EXAM: CT CHEST WITHOUT CONTRAST TECHNIQUE: Multidetector CT imaging of the chest was performed following the standard protocol without IV contrast. RADIATION DOSE REDUCTION: This exam was performed according to the departmental dose-optimization program which includes automated exposure control, adjustment of the mA and/or kV according to patient size and/or use of iterative reconstruction technique. COMPARISON:  Radiograph 04/09/2023. FINDINGS: Cardiovascular: The heart is enlarged. Prosthetic mitral valve. Pacemaker in place. No pericardial effusion. Aortic atherosclerosis without aneurysm. No pericardial effusion. Mediastinum/Nodes: No mediastinal adenopathy.  Assessment for hilar adenopathy is limited in the absence of IV contrast. No definite enlarged axillary nodes. Left lobe of the thyroid gland is diminutive or absent. Decompressed esophagus. Lungs/Pleura: Anterior left upper lobe opacity with air bronchogram is indeterminate for atelectasis or infection. Compressive atelectasis in the right lower lobe related to elevated hemidiaphragm. No pleural effusion or features of pulmonary edema. Upper Abdomen: Fatty atrophy of the pancreas. No acute upper abdominal findings. Musculoskeletal: Ill-defined fluid collection in the left anterior chest wall. Insert measurements are difficult  due to irregular shape, however this measures approximately 13.8 x 4.6 x 10.4 cm, series 3, image 62 and series 5, image 101. This contains an air-fluid level medially. Adjacent strandy density and ill-defined fluid anteriorly and tracking inferiorly. Mild bilateral gynecomastia. Skin staples in the anterior left upper chest wall at the superior aspect of fluid collection. There are no acute or suspicious osseous abnormalities. Bilateral gynecomastia. Chronic bilateral shoulder arthropathy. Prior median sternotomy. IMPRESSION: 1. Ill-defined fluid collection in the left anterior chest wall measuring approximately 13.8 x 4.6 x 10.4 cm. This contains an air-fluid level medially. This may represent a postoperative seroma, hematoma, or abscess. Sterility is indeterminate by imaging. 2. Anterior left upper lobe opacity with air bronchogram is indeterminate for atelectasis or infection. 3. Cardiomegaly. Aortic Atherosclerosis (ICD10-I70.0). Electronically Signed   By: Narda Rutherford M.D.   On: 04/29/2023 18:35    Procedures Procedures    Medications Ordered in ED Medications  acetaminophen (TYLENOL) tablet 650 mg (has no administration in time range)    Or  acetaminophen (TYLENOL) suppository 650 mg (has no administration in time range)  senna-docusate (Senokot-S) tablet 1 tablet (has  no administration in time range)  prochlorperazine (COMPAZINE) injection 5 mg (has no administration in time range)  levothyroxine (SYNTHROID) tablet 88 mcg (has no administration in time range)  melatonin tablet 5 mg (has no administration in time range)  amiodarone (PACERONE) tablet 400 mg (has no administration in time range)  quiniDINE gluconate CR tablet 324 mg (has no administration in time range)  torsemide (DEMADEX) tablet 40 mg (has no administration in time range)  nitroGLYCERIN (NITROSTAT) SL tablet 0.4 mg (has no administration in time range)    ED Course/ Medical Decision Making/ A&P Clinical Course as of 04/29/23 2122  Fri Apr 29, 2023  1600 I discussed with Dr. Lenell Antu of vascular surgery who request a noncontrasted CT of the chest (has CKD) and he will evaluate. [SG]    Clinical Course User Index [SG] Pricilla Loveless, MD                                 Medical Decision Making Amount and/or Complexity of Data Reviewed Labs: ordered.    Details: INR therapeutic.  Hemoglobin stable compared to baseline Radiology: ordered and independent interpretation performed.    Details: Fluid collection in chest wall  Risk Decision regarding hospitalization.   As above, CT was obtained and shows hematoma versus seroma versus other fluid collection.  Clinically does not seem to be an abscess.  Dr. Zola Button and we will try to get IR to place a drain.  He is otherwise stable will need medical admission due to his numerous comorbidities which include but are not limited to CHF, CKD, ventricular arrhythmias.  Discussed with Dr. Joneen Roach for admission.        Final Clinical Impression(s) / ED Diagnoses Final diagnoses:  Postprocedural seroma of a musculoskeletal structure following other procedure    Rx / DC Orders ED Discharge Orders     None         Pricilla Loveless, MD 04/29/23 2122

## 2023-04-29 NOTE — Telephone Encounter (Signed)
[name], RN from [facility] called stating that the pt was admitted there on 9/27. All tubes, lines, and wound vac had been d/c'd prior to admittance. He is now experiencing fluid collection in the L chest wall again and c/o tightness.  Reviewed pt's chart, returned call for clarification, two identifiers used. Instructed him to go to ED. [name] stated that the pt is not wanting to go, but will strongly encourage him, along with the doctor, to go. Page sent to on-call MD and PA's.

## 2023-04-29 NOTE — Consult Note (Signed)
[facility] [place]  ASSESSMENT / PLAN: 82 y.o. male with left chest wall fluid collection after placement of Barostim device for heart failure. This was infected with pseudomonas and removed. The cavity was washed out and a JP left in place. This was inadvertently removed, and the fluid collection returned. Will ask IR to place drain. Will need to hold Coumadin. Will follow along with you.  CHIEF COMPLAINT: left chest wall fluid collection  HISTORY OF PRESENT ILLNESS: Austin [name] is a 82 y.o. male well known to our service.  He had a Barostim device placed for heart failure 03/17/2023 [name].  He developed a left chest wall fluid collection which was washed out on 03/25/2023.  The Verastem implant was then removed 04/12/2023 and sent for culture.  Culture returned positive for Pseudomonas.  Drain was left in place 04/12/2023.  Patient was transferred to a recovery facility.  Unfortunately the drain was removed inadvertently.  Over the last several days, the patient has developed reaccumulation of fluid in the left chest wall space.  He is understandably frustrated.  The chest wall is tight but not terribly painful to him.  He reports no fever, tenderness, fluctuance, drainage.   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   Conductive hearing loss    uses hearing aids   Dysrhythmia    A-fib   History of blood transfusion 03/24/2023   Hypothyroidism    Intraspinal abscess    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   Nonischemic cardiomyopathy (HCC)    Psychosexual dysfunction with inhibited sexual excitement    S/P mitral valve replacement     Syncope and collapse    Unspecified sleep apnea    last sleep study 11/07, uses cpap   Ventricular tachycardia (HCC)    VT (ventricular tachycardia) (HCC)     Past Surgical History:  Procedure Laterality Date   BIV ICD GENERATOR CHANGEOUT N/A 05/04/2017   Procedure: BiV ICD Generator Changeout;  Surgeon: Marinus Maw, MD;  Location: [facility] INVASIVE CV LAB;  Service: Cardiovascular;  Laterality: N/A;   CARDIAC CATHETERIZATION  04/24/2002   CARDIAC VALVE REPLACEMENT     CARDIOVERSION  03/09/2012   Procedure: CARDIOVERSION;  Surgeon: Marinus Maw, MD;  Location: [facility] ENDOSCOPY;  Service: Cardiovascular;  Laterality: N/A;   CARDIOVERSION N/A 11/22/2013   Procedure: CARDIOVERSION;  Surgeon: Thurmon Fair, MD;  Location: [facility] ENDOSCOPY;  Service: Cardiovascular;  Laterality: N/A;   dental implants     ELECTROPHYSIOLOGIC STUDY N/A 09/15/2015   Procedure: V Tach Ablation;  Surgeon: Marinus Maw, MD;  Location: [facility] INVASIVE CV LAB;  Service: Cardiovascular;  Laterality: N/A;   Evacution of epidural lumbar epidural abscess  07/26/1997   INCISION AND DRAINAGE OF WOUND Left 03/25/2023   Procedure: Washout of Chest Wall Hematoma;  Surgeon: Nada Libman, MD;  Location: [facility] OR;  Service: Vascular;  Laterality: Left;   INCISION AND DRAINAGE OF WOUND N/A 03/29/2023   Procedure: IRRIGATION AND DEBRIDEMENT  CHEST WALL;  Surgeon: Nada Libman, MD;  Location: [facility] OR;  Service: Vascular;  Laterality: N/A;   INSERT / REPLACE / REMOVE PACEMAKER  11/23/2008  MITRAL VALVE REPLACEMENT     w #[id number] [facility]   PERMANENT PACEMAKER INSERTION N/A 08/05/2011   Procedure: PERMANENT PACEMAKER INSERTION;  Surgeon: Marinus Maw, MD;  Location: [facility] CATH LAB;  Service: Cardiovascular;  Laterality: N/A;   REMOVAL OF BAROSTIM N/A 04/12/2023   Procedure: REMOVAL OF [name];  Surgeon: Nada Libman, MD;  Location: [facility] OR;  Service: Vascular;  Laterality: N/A;   RIGHT HEART CATH N/A 02/26/2021   Procedure: RIGHT HEART  CATH;  Surgeon: Laurey Morale, MD;  Location: [facility] INVASIVE CV LAB;  Service: Cardiovascular;  Laterality: N/A;   THYROIDECTOMY     TONSILLECTOMY     V-TACH ABLATION  07/30/2021   in [place]   VALVE REPLACEMENT  07/26/1998    Family History  Problem Relation Age of Onset   Heart disease Mother    Heart failure Mother    Heart disease Father    Heart failure Father    Sleep apnea Neg Hx     Social History   Socioeconomic History   Marital status: Married    Spouse name: Not on file   Number of children: 1   Years of education: 14   Highest education level: Master's degree (e.g., MA, MS, MEng, MEd, MSW, MBA)  Occupational History   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  Has one daughter.  Retired.  Education: Masters.   Social Determinants of Health   Financial Resource Strain: Low Risk  (03/08/2023)   Overall Financial Resource Strain (CARDIA)    Difficulty of Paying Living Expenses: Not hard at all  Food Insecurity: No Food Insecurity (04/06/2023)   Hunger Vital Sign    Worried About Running Out of Food in the Last Year: Never true    Ran Out of Food in the Last Year: Never true  Transportation Needs: No Transportation Needs (04/06/2023)   PRAPARE - Administrator, Civil Service (Medical): No    Lack of Transportation (Non-Medical): No  Physical Activity: Insufficiently Active (03/08/2023)   Exercise Vital Sign    Days of Exercise per Week: 4 days    Minutes of Exercise per Session: 30 min  Stress: No Stress Concern Present (03/08/2023)   [employer] of Occupational Health - Occupational Stress Questionnaire     Feeling of Stress : Not at all  Social Connections: Moderately Integrated (03/08/2023)   Social Connection and Isolation Panel [NHANES]    Frequency of Communication with Friends and Family: More than three times a week    Frequency of Social Gatherings with Friends and Family: Twice a week    Attends Religious Services: More than 4 times per year    Active Member of [employer] or Organizations: No    Attends Banker Meetings: Never    Marital Status: Married  Catering manager Violence: Not At Risk (04/06/2023)   Humiliation, Afraid, Rape, and Kick questionnaire    Fear of Current or Ex-Partner: No    Emotionally Abused: No    Physically Abused: No    Sexually Abused: No    No Known  Allergies  No current facility-administered medications for this encounter.   Current Outpatient Medications  Medication Sig Dispense Refill   acetaminophen (TYLENOL) 500 MG tablet Take 1,000 mg by mouth every 8 (eight) hours as needed for headache, fever or mild pain.     amiodarone (PACERONE) 200 MG tablet Take amio 400 mg twice a day     amoxicillin (AMOXIL) 500 MG capsule TAKE 4 CAPSULES BY MOUTH 30 MINUTES PRIOR TO DENTAL WORK 4 capsule 2   ciprofloxacin (CIPRO) 500 MG tablet Take 1 tablet (500 mg total) by mouth daily with breakfast. 3 tablet 0   levothyroxine (SYNTHROID) 88 MCG tablet TAKE 1 TABLET BY MOUTH EVERY DAY 90 tablet 0   melatonin 5 MG TABS Take 5 mg by mouth at bedtime.     mirtazapine (REMERON) 7.5 MG tablet Take 1 tablet (7.5 mg total) by mouth at bedtime.     Multiple Vitamin (MULTIVITAMIN WITH MINERALS) TABS tablet Take 1 tablet by mouth in the morning.     mupirocin ointment (BACTROBAN) 2 % Apply 1 Application topically daily. (Patient taking differently: Apply 1 Application topically daily as needed (Dermatitis).) 30 g 2   nitroGLYCERIN (NITROSTAT) 0.4 MG SL tablet Place 1 tablet (0.4 mg total) under the tongue every 5 (five) minutes x 3 doses as needed for chest pain. 25  tablet 2   quiniDINE gluconate 324 MG CR tablet TAKE 1 TABLET BY MOUTH 2 TIMES DAILY. 180 tablet 3   torsemide (DEMADEX) 20 MG tablet Take 2 tablets (40 mg total) by mouth 2 (two) times daily.     warfarin (COUMADIN) 2.5 MG tablet Take 1 tablet (2.5 mg total) by mouth daily at 4 PM.      PHYSICAL EXAM Vitals:   04/29/23 1515  BP: 112/76  Pulse: 85  Resp: 17  Temp: (!) 97.4 F (36.3 C)  TempSrc: Oral  SpO2: 100%   Chronically ill elderly man in no distress Regular rate and rhythm Unlabored breathing Left chest incision healed with staples in place Left neck incision healed Ballotable fluid collection under the skin of the left chest.  No external signs of infection.  PERTINENT LABORATORY AND RADIOLOGIC DATA  Most recent CBC    Latest Ref Rng & Units 04/29/2023    3:42 PM 04/21/2023    5:25 AM 04/18/2023    3:06 AM  CBC  WBC 4.0 - 10.5 K/uL 11.7  7.7  7.0   Hemoglobin 13.0 - 17.0 g/dL 9.3  9.2  9.3   Hematocrit 39.0 - 52.0 % 29.9  30.3  29.6   Platelets 150 - 400 K/uL PLATELET CLUMPS NOTED ON SMEAR, COUNT APPEARS DECREASED  104  90      Most recent CMP    Latest Ref Rng & Units 04/22/2023    5:40 AM 04/21/2023    5:25 AM 04/20/2023    5:15 AM  CMP  Glucose 70 - 99 mg/dL 90  161  096   BUN 8 - 23 mg/dL 32  33  37   Creatinine 0.61 - 1.24 mg/dL 0.45  4.09  8.11   Sodium 135 - 145 mmol/L 132  133  132   Potassium 3.5 - 5.1 mmol/L 3.4  3.5  3.5   Chloride 98 - 111 mmol/L 94  93  91   CO2 22 - 32 mmol/L 31  31  28    Calcium 8.9 - 10.3 mg/dL 7.9  8.3  8.6     Renal function Estimated Creatinine Clearance:  21.3 mL/min (A) (by C-G formula based on SCr of 3.12 mg/dL (H)).  No results found for: "HGBA1C"  LDL Cholesterol  Date Value Ref Range Status  08/23/2015 82 0 - 99 mg/dL Final    Comment:           Total Cholesterol/HDL:CHD Risk Coronary Heart Disease Risk Table                     Men   Women  1/2 Average Risk   3.4   3.3  Average Risk       5.0   4.4  2 X  Average Risk   9.6   7.1  3 X Average Risk  23.4   11.0        Use the calculated Patient Ratio above and the CHD Risk Table to determine the patient's CHD Risk.        ATP III CLASSIFICATION (LDL):  <100     mg/dL   Optimal  161-096  mg/dL   Near or Above                    Optimal  130-159  mg/dL   Borderline  045-409  mg/dL   High  >811     mg/dL   Very High      Angelissa Supan N. Lenell Antu, MD FACS [facility] Phone Number: [phone] 04/29/2023 6:29 PM   Total time spent on preparing this encounter including chart review, data review, collecting history, examining the patient, coordinating care for this established patient, 30 minutes.  Portions of this report may have been transcribed using voice recognition software.  Every effort has been made to ensure accuracy; however, inadvertent computerized transcription errors may still be present.

## 2023-04-29 NOTE — Progress Notes (Signed)
Provider:  Andree Coss Location:   [facility]   Place of Service:  SNF    PCP: [name], MD Patient Care Team: [name], MD as PCP - General (Internal Medicine) Marinus Maw, MD as PCP - Electrophysiology (Cardiology)  Extended Emergency Contact Information Primary Emergency Contact: [facility] Phone: [phone] Relation: Daughter Preferred language: [place] Secondary Emergency Contact: Raymondo Band Address: [address]          [place], [place] [address] [name] Home Phone: [phone] Mobile Phone: [phone] Relation: Spouse  Code Status:  Goals of Care: Advanced Directive information    04/06/2023    3:00 PM  Advanced Directives  Does Patient Have a Medical Advance Directive? Yes  Type of Advance Directive Healthcare Power of Attorney  Does patient want to make changes to medical advance directive? No - Patient declined  Copy of Healthcare Power of Attorney in Chart? No - copy requested      No chief complaint on file.   HPI: Patient is a 82 y.o. male seen today for  acute visit  Pt recently admitted to Skilled care after a completed hospital course. 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  Pt seen and examined in his room. He is having fluid collection on left chest wall.  Mild tenderness on palpation  Staples in place, no gaping , no drainage  Pt c/o exertional SOB, able to walk short distances with Rolator walker  Denies chest pain, palpitations, dizzy or lightheadedness.  Pt c/o dark stools since this morning  Hemoccult positive  Pt denies abdominal pain, nausea, vomiting.     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    cx by infection, explantation11/12 & reimplant 1/13   CHF (congestive heart failure) (HCC)    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   Conductive hearing loss    uses hearing aids   Dysrhythmia    A-fib   History of blood transfusion 03/24/2023   Hypothyroidism    Intraspinal abscess    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   Nonischemic cardiomyopathy (HCC)    Psychosexual dysfunction with inhibited sexual excitement    S/P mitral valve replacement    Syncope and collapse    Unspecified sleep apnea    last sleep study 11/07, uses cpap   Ventricular tachycardia (HCC)    VT (ventricular tachycardia) (HCC)    Past Surgical History:  Procedure Laterality Date   BIV ICD GENERATOR CHANGEOUT N/A 05/04/2017   Procedure: BiV ICD Generator Changeout;  Surgeon: Marinus Maw, MD;  Location: [facility] INVASIVE CV LAB;  Service: Cardiovascular;  Laterality: N/A;   CARDIAC CATHETERIZATION  04/24/2002   CARDIAC VALVE REPLACEMENT     CARDIOVERSION  03/09/2012   Procedure: CARDIOVERSION;  Surgeon: Marinus Maw, MD;  Location: [facility] ENDOSCOPY;  Service: Cardiovascular;  Laterality: N/A;   CARDIOVERSION N/A 11/22/2013   Procedure: CARDIOVERSION;  Surgeon: Thurmon Fair, MD;  Location: [facility] ENDOSCOPY;  Service: Cardiovascular;  Laterality: N/A;   dental implants  ELECTROPHYSIOLOGIC STUDY N/A 09/15/2015   Procedure: V Tach Ablation;  Surgeon: Marinus Maw, MD;  Location: [facility] INVASIVE CV LAB;  Service: Cardiovascular;  Laterality: N/A;   Evacution of epidural lumbar epidural abscess  07/26/1997   INCISION AND DRAINAGE OF WOUND Left 03/25/2023   Procedure: Washout of Chest Wall Hematoma;  Surgeon: Nada Libman, MD;  Location: [facility] OR;  Service: Vascular;  Laterality: Left;   INCISION  AND DRAINAGE OF WOUND N/A 03/29/2023   Procedure: IRRIGATION AND DEBRIDEMENT  CHEST WALL;  Surgeon: Nada Libman, MD;  Location: [facility] OR;  Service: Vascular;  Laterality: N/A;   INSERT / REPLACE / REMOVE PACEMAKER  11/23/2008   MITRAL VALVE REPLACEMENT     w #[id number] [facility]   PERMANENT PACEMAKER INSERTION N/A 08/05/2011   Procedure: PERMANENT PACEMAKER INSERTION;  Surgeon: Marinus Maw, MD;  Location: [facility] CATH LAB;  Service: Cardiovascular;  Laterality: N/A;   REMOVAL OF BAROSTIM N/A 04/12/2023   Procedure: REMOVAL OF [name];  Surgeon: Nada Libman, MD;  Location: [facility] OR;  Service: Vascular;  Laterality: N/A;   RIGHT HEART CATH N/A 02/26/2021   Procedure: RIGHT HEART CATH;  Surgeon: Laurey Morale, MD;  Location: [facility] INVASIVE CV LAB;  Service: Cardiovascular;  Laterality: N/A;   THYROIDECTOMY     TONSILLECTOMY     V-TACH ABLATION  07/30/2021   in [place]   VALVE REPLACEMENT  07/26/1998    reports that he has never smoked. He has never used smokeless tobacco. He reports that he does not currently use alcohol. He reports that he does not use drugs. Social History   Socioeconomic History   Marital status: Married    Spouse name: Not on file   Number of children: 1   Years of education: 6   Highest education level: Master's degree (e.g., MA, MS, MEng, MEd, MSW, MBA)  Occupational History   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  Has one daughter.  Retired.  Education: Masters.   Social Determinants of Health   Financial Resource Strain: Low Risk  (03/08/2023)   Overall Financial Resource Strain (CARDIA)    Difficulty of Paying Living  Expenses: Not hard at all  Food Insecurity: No Food Insecurity (04/06/2023)   Hunger Vital Sign    Worried About Running Out of Food in the Last Year: Never true    Ran Out of Food in the Last Year: Never true  Transportation Needs: No Transportation Needs (04/06/2023)   PRAPARE - Administrator, Civil Service (Medical): No    Lack of Transportation (Non-Medical): No  Physical Activity: Insufficiently Active (03/08/2023)   Exercise Vital Sign    Days of Exercise per Week: 4 days    Minutes of Exercise per Session: 30 min  Stress: No Stress Concern Present (03/08/2023)   [employer] of Occupational Health - Occupational Stress Questionnaire    Feeling of Stress : Not at all  Social Connections: Moderately Integrated (03/08/2023)   Social Connection and Isolation Panel [NHANES]    Frequency of Communication with Friends and Family: More  than three times a week    Frequency of Social Gatherings with Friends and Family: Twice a week    Attends Religious Services: More than 4 times per year    Active Member of [employer] or Organizations: No    Attends Banker Meetings: Never    Marital Status: Married  Catering manager Violence: Not At Risk (04/06/2023)   Humiliation, Afraid, Rape, and Kick questionnaire    Fear of Current or Ex-Partner: No    Emotionally Abused: No    Physically Abused: No    Sexually Abused: No    Functional Status Survey:    Family History  Problem Relation Age of Onset   Heart disease Mother    Heart failure Mother    Heart disease Father    Heart failure Father    Sleep apnea Neg Hx     Health Maintenance  Topic Date Due   DTaP/Tdap/Td (1 - Tdap) Never done   Zoster Vaccines- Shingrix (1 of 2) Never done   COVID-19 Vaccine (3 - Pfizer risk series) 10/01/2019   INFLUENZA VACCINE  02/24/2023   Medicare Annual Wellness (AWV)  03/07/2024   Pneumonia Vaccine 54+ Years old  Completed   HPV VACCINES  Aged Out    No Known  Allergies  Outpatient Encounter Medications as of 04/29/2023  Medication Sig   acetaminophen (TYLENOL) 500 MG tablet Take 1,000 mg by mouth every 8 (eight) hours as needed for headache, fever or mild pain.   amiodarone (PACERONE) 200 MG tablet Take amio 400 mg twice a day   amoxicillin (AMOXIL) 500 MG capsule TAKE 4 CAPSULES BY MOUTH 30 MINUTES PRIOR TO DENTAL WORK   ciprofloxacin (CIPRO) 500 MG tablet Take 1 tablet (500 mg total) by mouth daily with breakfast.   levothyroxine (SYNTHROID) 88 MCG tablet TAKE 1 TABLET BY MOUTH EVERY DAY   melatonin 5 MG TABS Take 5 mg by mouth at bedtime.   mirtazapine (REMERON) 7.5 MG tablet Take 1 tablet (7.5 mg total) by mouth at bedtime.   Multiple Vitamin (MULTIVITAMIN WITH MINERALS) TABS tablet Take 1 tablet by mouth in the morning.   mupirocin ointment (BACTROBAN) 2 % Apply 1 Application topically daily. (Patient taking differently: Apply 1 Application topically daily as needed (Dermatitis).)   nitroGLYCERIN (NITROSTAT) 0.4 MG SL tablet Place 1 tablet (0.4 mg total) under the tongue every 5 (five) minutes x 3 doses as needed for chest pain.   quiniDINE gluconate 324 MG CR tablet TAKE 1 TABLET BY MOUTH 2 TIMES DAILY.   torsemide (DEMADEX) 20 MG tablet Take 2 tablets (40 mg total) by mouth 2 (two) times daily.   warfarin (COUMADIN) 2.5 MG tablet Take 1 tablet (2.5 mg total) by mouth daily at 4 PM.   No facility-administered encounter medications on file as of 04/29/2023.    Review of Systems  Constitutional:  Negative for chills and fever.  HENT:  Negative for sore throat.   Respiratory:  Positive for shortness of breath (exertional). Negative for cough and wheezing.   Cardiovascular:  Negative for chest pain, palpitations and leg swelling.  Gastrointestinal:  Negative for abdominal distention, abdominal pain, blood in stool, constipation, diarrhea, nausea and vomiting.  Genitourinary:  Negative for dysuria, frequency and urgency.  Skin:        Left  sided anterior chest - fluid collection extending to left axilla Staples in place, no gaping , no drainage   Neurological:  Negative for dizziness, weakness and numbness.  Psychiatric/Behavioral:  Negative  for confusion.     There were no vitals filed for this visit. There is no height or weight on file to calculate BMI. Physical Exam Constitutional:      Appearance: Normal appearance.  Cardiovascular:     Rate and Rhythm: Normal rate.     Heart sounds: Murmur heard.  Pulmonary:     Breath sounds: No wheezing or rales.     Comments: Breath sounds diminished left lung base Abdominal:     General: There is no distension.     Tenderness: There is no abdominal tenderness. There is no guarding.  Neurological:     Mental Status: He is alert. Mental status is at baseline.     Labs reviewed: Basic Metabolic Panel: Recent Labs    04/10/23 1405 04/11/23 0434 04/12/23 0429 04/12/23 0431 04/18/23 0306 04/19/23 0330 04/20/23 0515 04/21/23 0525 04/22/23 0540  NA 130* 134*  --    < > 130*   < > 132* 133* 132*  K 4.0 3.7  --    < > 3.4*   < > 3.5 3.5 3.4*  CL 95* 96*  --    < > 91*   < > 91* 93* 94*  CO2 24 28  --    < > 27   < > 28 31 31   GLUCOSE 224* 132*  --    < > 166*   < > 101* 129* 90  BUN 35* 35*  --    < > 48*   < > 37* 33* 32*  CREATININE 2.28* 2.70*  --    < > 3.88*   < > 2.99* 3.05* 3.12*  CALCIUM 8.0* 8.3*  --    < > 8.2*   < > 8.6* 8.3* 7.9*  MG  --  1.9 2.2  --  2.4  --   --   --   --   PHOS 4.0  --   --   --   --   --   --   --   --    < > = values in this interval not displayed.   Liver Function Tests: Recent Labs    03/17/23 0753 03/24/23 0503 04/06/23 1030 04/10/23 1405  AST 38 25 30  --   ALT 28 20 24   --   ALKPHOS 79 63 78  --   BILITOT 1.0 0.6 1.4*  --   PROT 6.9 5.2* 5.9*  --   ALBUMIN 3.7 2.9* 3.2* 3.0*   No results for input(s): "LIPASE", "AMYLASE" in the last 8760 hours. No results for input(s): "AMMONIA" in the last 8760  hours. CBC: Recent Labs    09/27/22 1612 12/08/22 1612 03/23/23 1018 03/23/23 1223 04/06/23 1030 04/07/23 0247 04/17/23 0305 04/18/23 0306 04/21/23 0525  WBC 8.2   < > 9.6   < > 8.6   < > 7.5 7.0 7.7  NEUTROABS 6.4  --  7.8*  --  7.0  --   --   --   --   HGB 12.4*   < > 9.3*   < > 9.4*   < > 9.3* 9.3* 9.2*  HCT 37.5*   < > 28.4*   < > 31.2*   < > 29.7* 29.6* 30.3*  MCV 91.3   < > 94.7   < > 106.5*   < > 95.8 96.1 97.1  PLT 121.0*   < > 151   < > 146*   < > 93* 90* 104*   < > =  values in this interval not displayed.   Cardiac Enzymes: No results for input(s): "CKTOTAL", "CKMB", "CKMBINDEX", "TROPONINI" in the last 8760 hours. BNP: Invalid input(s): "POCBNP" No results found for: "HGBA1C" Lab Results  Component Value Date   TSH 18.440 (H) 04/06/2023   Lab Results  Component Value Date   VITAMINB12 537 03/24/2023   Lab Results  Component Value Date   FOLATE 24.8 03/24/2023   Lab Results  Component Value Date   IRON 60 03/24/2023   TIBC 266 03/24/2023   FERRITIN 144 03/24/2023    Imaging and Procedures obtained prior to SNF admission: ECHOCARDIOGRAM LIMITED  Result Date: 04/07/2023    ECHOCARDIOGRAM LIMITED REPORT   Patient Name:   Austin [name] Date of Exam: 04/07/2023 Medical Rec #:  [id number]              Height:       73.0 in Accession #:    [id number]             Weight:       200.8 lb Date of Birth:  03/11/1941               BSA:          2.155 m Patient Age:    82 years               BP:           102/92 mmHg Patient Gender: M                      HR:           108 bpm. 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 Cardiomyopathy and CHF, Defibrillator and                 Abnormal ECG, Arrythmias:Atrial Fibrillation, Atrial Flutter and                 VT;  Signs/Symptoms:Dyspnea and Shortness of Breath.                  Mitral Valve: unknown size mechanical valve valve is present in                 the mitral position.  Sonographer:    Sheralyn Boatman RDCS Referring Phys: Dorthula Nettles  Sonographer Comments: Patient has wound vac in parasternal region. IMPRESSIONS  1. Left ventricular ejection fraction, by estimation, is 20%. The left ventricle has severely decreased function. The left ventricle demonstrates global hypokinesis. No LV thrombus noted. Indeterminant diastolic function.  2. Peak RV-RA gradient 19 mmHg. IVC not visualized. Right ventricular systolic function is mildly reduced. The right ventricular size is normal.  3. Mechanical mitral valve with mean gradient 8 mmHg, increased from prior. No mitral regurgitation noted though shadowing from prosthetic valve makes this difficult to discern.  4. Tricuspid valve regurgitation is moderate.  5. The aortic valve is tricuspid. There is moderate calcification of the aortic valve. Doppler evaluation of aortic valve not done.  6. Limited echo. FINDINGS  Left Ventricle: Left ventricular ejection fraction, by estimation, is 20%. The left ventricle has severely decreased function. The left ventricle demonstrates global hypokinesis. Left ventricular diastolic  parameters are indeterminate. Right Ventricle: Peak RV-RA gradient 19 mmHg. IVC not visualized. The right ventricular size is normal. No increase in right ventricular wall thickness. Right ventricular systolic function is mildly reduced. Mitral Valve: Mechanical mitral valve with mean gradient 8 mmHg, increased from prior. No mitral regurgitation noted though shadowing from prosthetic valve makes this difficult to discern. There is a unknown size mechanical valve present in the mitral position. MV peak gradient, 10.4 mmHg. The mean mitral valve gradient is 8.0 mmHg. Tricuspid Valve: The tricuspid valve is normal in structure. Tricuspid valve regurgitation is moderate.  Aortic Valve: The aortic valve is tricuspid. There is moderate calcification of the aortic valve. Venous: The inferior vena cava was not well visualized. Additional Comments: A device lead is visualized in the right ventricle. Spectral Doppler performed. Color Doppler performed.  LEFT VENTRICLE PLAX 2D LVOT diam:     2.70 cm LV SV:         55 LV SV Index:   26 LVOT Area:     5.73 cm  LV Volumes (MOD) LV vol d, MOD A2C: 177.0 ml LV vol d, MOD A4C: 155.0 ml LV vol s, MOD A2C: 148.0 ml LV vol s, MOD A4C: 119.0 ml LV SV MOD A2C:     29.0 ml LV SV MOD A4C:     155.0 ml LV SV MOD BP:      35.4 ml AORTIC VALVE LVOT Vmax:   79.60 cm/s LVOT Vmean:  47.500 cm/s LVOT VTI:    0.096 m MITRAL VALVE              TRICUSPID VALVE MV Area VTI:  2.19 cm    TR Peak grad:   19.0 mmHg MV Peak grad: 10.4 mmHg   TR Vmax:        218.00 cm/s MV Mean grad: 8.0 mmHg MV Vmax:      1.62 m/s    SHUNTS MV Vmean:     110.9 cm/s  Systemic VTI:  0.10 m                           Systemic Diam: 2.70 cm Dalton McleanMD Electronically signed by Wilfred Lacy Signature Date/Time: 04/07/2023/2:14:27 PM    Final    DG Chest Portable 1 View  Result Date: 04/06/2023 CLINICAL DATA:  Shortness of breath. EXAM: PORTABLE CHEST 1 VIEW COMPARISON:  March 23, 2023. FINDINGS: Stable cardiomediastinal silhouette. Right-sided defibrillator is again noted. Mild central pulmonary vascular congestion is noted with possible bibasilar atelectasis or edema. Degenerative changes seen involving both shoulder joints. Status post mitral valve repair. IMPRESSION: Mild central pulmonary vascular congestion is noted with possible bibasilar atelectasis or edema. Electronically Signed   By: Lupita Raider M.D.   On: 04/06/2023 11:42    Assessment/Plan  1. Fluid collection at surgical site, sequela Fluid collection anterior chest wall  Staples at incision site  No gaping , no drainage  Will send him to the ED   2. Hypokalemia K 2.9   3. Dark stools Hemoglobin  dropped  Hemoccult positive  Will send him to the ED   Family/ staff Communication: wife at bedside Plan of care discussed with the nursing staff

## 2023-04-29 NOTE — H&P (Incomplete)
PCP:   Austin Grandchild, MD   Chief Complaint:  Left chest swelling  HPI: This is a 82 year old male with past medical history of chronic atrial fibrillation on chronic anticoagulation, history of mechanical mitral valve replacement, CKD stage IIIb, hypothyroidism, HFrEF, AICD placement.  Patient additionally had a bladder stimulator placed.  Patient subsequently developed a chest wall hematoma post barostimulator placement with infection that grew Pseudomonas.,  The barostimulator was removed and infection treated with antibiotics.  Patient had a drain that was removed approximately a week and a half ago.  Per patient all was fine until 3 days ago when fluid started building up in his left chest wall.  The patient denies fever, chills, nausea, vomiting, pain.  He endorses some mild tightness when his left arm is moved.  He came to the ER.  Additionally patient is a resident of skilled nursing facility, per report he had blood in his stool today for the first time.  He states his stool is brown but today it was dark.  Occult stool done at the facility  Here in the ER, Hemoccult done by me was negative.  Patient seen by Dr. Blase Mess in the ER.  He recommends admission and IR drain.  Review of Systems:  Per HPI  Past 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   Conductive hearing loss    uses hearing aids   Dysrhythmia    A-fib   History of blood transfusion 03/24/2023   Hypothyroidism    Intraspinal abscess    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   Nonischemic cardiomyopathy (HCC)    Psychosexual dysfunction with inhibited sexual excitement    S/P mitral  valve replacement    Syncope and collapse    Unspecified sleep apnea    last sleep study 11/07, uses cpap   Ventricular tachycardia (HCC)    VT (ventricular tachycardia) (HCC)    Past Surgical History:  Procedure Laterality Date   BIV ICD GENERATOR CHANGEOUT N/A 05/04/2017   Procedure: BiV ICD Generator Changeout;  Surgeon: Marinus Maw, MD;  Location: [facility] INVASIVE CV LAB;  Service: Cardiovascular;  Laterality: N/A;   CARDIAC CATHETERIZATION  04/24/2002   CARDIAC VALVE REPLACEMENT     CARDIOVERSION  03/09/2012   Procedure: CARDIOVERSION;  Surgeon: Marinus Maw, MD;  Location: [facility] ENDOSCOPY;  Service: Cardiovascular;  Laterality: N/A;   CARDIOVERSION N/A 11/22/2013   Procedure: CARDIOVERSION;  Surgeon: Thurmon Fair, MD;  Location: [facility] ENDOSCOPY;  Service: Cardiovascular;  Laterality: N/A;   dental implants     ELECTROPHYSIOLOGIC STUDY N/A 09/15/2015   Procedure: V Tach Ablation;  Surgeon: Marinus Maw, MD;  Location: [facility] INVASIVE CV LAB;  Service: Cardiovascular;  Laterality: N/A;   Evacution of epidural lumbar epidural abscess  07/26/1997   INCISION AND DRAINAGE OF WOUND Left 03/25/2023   Procedure: Washout of Chest Wall Hematoma;  Surgeon: Nada Libman, MD;  Location: [facility] OR;  Service: Vascular;  Laterality: Left;   INCISION AND DRAINAGE OF WOUND N/A 03/29/2023   Procedure: IRRIGATION AND DEBRIDEMENT  CHEST WALL;  Surgeon: Nada Libman, MD;  Location: [facility] OR;  Service: Vascular;  Laterality: N/A;  INSERT / REPLACE / REMOVE PACEMAKER  11/23/2008   MITRAL VALVE REPLACEMENT     w #[facility]   PERMANENT PACEMAKER INSERTION N/A 08/05/2011   Procedure: PERMANENT PACEMAKER INSERTION;  Surgeon: Marinus Maw, MD;  Location: [facility] CATH LAB;  Service: Cardiovascular;  Laterality: N/A;   REMOVAL OF BAROSTIM N/A 04/12/2023   Procedure: REMOVAL OF [name];  Surgeon: Nada Libman, MD;  Location: [facility] OR;  Service: Vascular;  Laterality: N/A;   RIGHT HEART CATH N/A 02/26/2021    Procedure: RIGHT HEART CATH;  Surgeon: Laurey Morale, MD;  Location: [facility] INVASIVE CV LAB;  Service: Cardiovascular;  Laterality: N/A;   THYROIDECTOMY     TONSILLECTOMY     V-TACH ABLATION  07/30/2021   in CE   VALVE REPLACEMENT  07/26/1998    Medications: Prior to Admission medications   Medication Sig Start Date End Date Taking? Authorizing Provider  acetaminophen (TYLENOL) 500 MG tablet Take 1,000 mg by mouth every 8 (eight) hours as needed for headache, fever or mild pain.   Yes [provider]  amiodarone (PACERONE) 200 MG tablet Take amio 400 mg twice a day 04/22/23  Yes Clegg, Amy D, NP  ciprofloxacin (CIPRO) 500 MG tablet Take 1 tablet (500 mg total) by mouth daily with breakfast. 04/23/23  Yes Clegg, Amy D, NP  levothyroxine (SYNTHROID) 88 MCG tablet TAKE 1 TABLET BY MOUTH EVERY DAY 03/23/23  Yes Austin Grandchild, MD  melatonin 5 MG TABS Take 5 mg by mouth at bedtime.   Yes [provider]  mirtazapine (REMERON) 7.5 MG tablet Take 1 tablet (7.5 mg total) by mouth at bedtime. 04/25/23  Yes Venita Sheffield, MD  Multiple Vitamin (MULTIVITAMIN WITH MINERALS) TABS tablet Take 1 tablet by mouth in the morning.   Yes [provider]  mupirocin ointment (BACTROBAN) 2 % Apply 1 Application topically daily. 03/01/23  Yes McDonald, Rachelle Hora, DPM  nitroGLYCERIN (NITROSTAT) 0.4 MG SL tablet Place 1 tablet (0.4 mg total) under the tongue every 5 (five) minutes x 3 doses as needed for chest pain. 10/14/18  Yes Kilroy, Luke K, PA-C  quiniDINE gluconate 324 MG CR tablet TAKE 1 TABLET BY MOUTH 2 TIMES DAILY. 01/25/23  Yes Laurey Morale, MD  torsemide (DEMADEX) 20 MG tablet Take 2 tablets (40 mg total) by mouth 2 (two) times daily. 04/22/23  Yes Clegg, Amy D, NP  warfarin (COUMADIN) 2.5 MG tablet Take 1 tablet (2.5 mg total) by mouth daily at 4 PM. 04/22/23  Yes Clegg, Amy D, NP    Allergies:  No Known Allergies  Social History:  reports that he has never smoked. He has  never used smokeless tobacco. He reports that he does not currently use alcohol. He reports that he does not use drugs.  Family History: Family History  Problem Relation Age of Onset   Heart disease Mother    Heart failure Mother    Heart disease Father    Heart failure Father    Sleep apnea Neg Hx     Physical Exam: Vitals:   04/29/23 1515 04/29/23 1915  BP: 112/76 104/72  Pulse: 85 85  Resp: 17 (!) 21  Temp: (!) 97.4 F (36.3 C) (!) 97.3 F (36.3 C)  TempSrc: Oral Oral  SpO2: 100% 97%    General:  Alert and oriented times three, well developed and nourished, no acute distress Eyes: Pink conjunctiva, ENT: Moist oral mucosa, neck supple, no thyromegaly Lungs: CTA B/L, no wheeze, no  crackles, no use of accessory muscles Cardiovascular: RRR, Cardiac click.  Left chest wall seroma.  No erythema, no warmth, no drainage Abdomen: soft, positive BS, NTND, no organomegaly, not an acute abdomen GU: not examined Neuro: CN II - XII grossly intact, sensation intact Musculoskeletal: strength 5/5 all extremities. 3+ B/L pitting edema, scattered blisters secondary to edema Skin: no rash, no subcutaneous crepitation, no decubitus Psych: appropriate patient   Labs on Admission:  Recent Labs    04/29/23 1720  NA 134*  K 3.4*  CL 94*  CO2 30  GLUCOSE 94  BUN 37*  CREATININE 2.83*  CALCIUM 7.8*   Recent Labs    04/29/23 1720  AST 49*  ALT 26  ALKPHOS 83  BILITOT 0.8  PROT 5.3*  ALBUMIN 2.8*    Recent Labs    04/29/23 1542  WBC 11.7*  NEUTROABS 9.7*  HGB 9.3*  HCT 29.9*  MCV 99.0  PLT PLATELET CLUMPS NOTED ON SMEAR, COUNT APPEARS DECREASED    Radiological Exams on Admission: CT Chest Wo Contrast  Result Date: 04/29/2023 CLINICAL DATA:  Postoperative swelling. Recent removal of marrow stem device. JP drain removed 4 days ago. EXAM: CT CHEST WITHOUT CONTRAST TECHNIQUE: Multidetector CT imaging of the chest was performed following the standard protocol without IV  contrast. RADIATION DOSE REDUCTION: This exam was performed according to the departmental dose-optimization program which includes automated exposure control, adjustment of the mA and/or kV according to patient size and/or use of iterative reconstruction technique. COMPARISON:  Radiograph 04/09/2023. FINDINGS: Cardiovascular: The heart is enlarged. Prosthetic mitral valve. Pacemaker in place. No pericardial effusion. Aortic atherosclerosis without aneurysm. No pericardial effusion. Mediastinum/Nodes: No mediastinal adenopathy. Assessment for hilar adenopathy is limited in the absence of IV contrast. No definite enlarged axillary nodes. Left lobe of the thyroid gland is diminutive or absent. Decompressed esophagus. Lungs/Pleura: Anterior left upper lobe opacity with air bronchogram is indeterminate for atelectasis or infection. Compressive atelectasis in the right lower lobe related to elevated hemidiaphragm. No pleural effusion or features of pulmonary edema. Upper Abdomen: Fatty atrophy of the pancreas. No acute upper abdominal findings. Musculoskeletal: Ill-defined fluid collection in the left anterior chest wall. Insert measurements are difficult due to irregular shape, however this measures approximately 13.8 x 4.6 x 10.4 cm, series 3, image 62 and series 5, image 101. This contains an air-fluid level medially. Adjacent strandy density and ill-defined fluid anteriorly and tracking inferiorly. Mild bilateral gynecomastia. Skin staples in the anterior left upper chest wall at the superior aspect of fluid collection. There are no acute or suspicious osseous abnormalities. Bilateral gynecomastia. Chronic bilateral shoulder arthropathy. Prior median sternotomy. IMPRESSION: 1. Ill-defined fluid collection in the left anterior chest wall measuring approximately 13.8 x 4.6 x 10.4 cm. This contains an air-fluid level medially. This may represent a postoperative seroma, hematoma, or abscess. Sterility is indeterminate by  imaging. 2. Anterior left upper lobe opacity with air bronchogram is indeterminate for atelectasis or infection. 3. Cardiomegaly. Aortic Atherosclerosis (ICD10-I70.0). Electronically Signed   By: Narda Rutherford M.D.   On: 04/29/2023 18:35    Assessment/Plan Present on Admission:  Postprocedural seroma subcutaneous chest wall tissue, postprocedural -CT surgeon on board.  IR has been consulted for drain placement -N.p.o. at midnight -Patient anticoagulated with therapeutic INR 2.8.  Coumadin on hold.  INR in a.m.  Heparin drip when needed.  FFP prior to procedure, so that patient can be easily reanticoagulated.  ?  GI bleed -Heme-negative in ER -Additional occult stool ordered -For now  serial H&H  Hypokalemia -Repleting p.o.   Chronic atrial fibrillation //chronic anticoagulation //history of mechanical MVR -Amiodarone resumed -Needs anticoagulation with mechanical valve -INR therapeutic.   Stage 4 chronic kidney disease (HCC) -Stable   OSA -CPAP ordered   Hypothyroidism -Synthroid ordered   HFrEF -Torsemide continued, -Single dose IV Lasix ordered  Eren Ryser 04/29/2023, 8:16 PM

## 2023-04-29 NOTE — ED Notes (Signed)
ED TO INPATIENT HANDOFF REPORT  ED Nurse Name and Phone #: [phone]  S Name/Age/Gender Austin [name] 82 y.o. male Room/Bed: 026C/026C  Code Status   Code Status: Full Code  Home/SNF/Other Skilled nursing facility ([facility]) Patient oriented to: self, place, time, and situation Is this baseline? Yes   Triage Complete: Triage complete  Chief Complaint Postprocedural seroma of skin and subcutaneous tissue following other procedure [L76.34]  Triage Note Pt here from [facility] with a buildup of fluid to the left side of his chest , just had a jp drain removed from that same area 4 days ago    Allergies No Known Allergies  Level of Care/Admitting Diagnosis ED Disposition     ED Disposition  Admit   Condition  --   Comment  Hospital Area: [facility] [[id number]]  Level of Care: Telemetry Medical [104]  May place patient in observation at [facility] or [facility] if equivalent level of care is available:: No  Covid Evaluation: Asymptomatic - no recent exposure (last 10 days) testing not required  Diagnosis: Postprocedural seroma of skin and subcutaneous tissue following other procedure [[id number]]  Admitting Physician: Gery Pray [[id number]]  Attending Physician: Gery Pray [[id number]]          B Medical/Surgery History 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   Conductive hearing loss    uses hearing aids   Dysrhythmia    A-fib   History of blood transfusion 03/24/2023   Hypothyroidism    Intraspinal abscess    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   Nonischemic cardiomyopathy (HCC)    Psychosexual  dysfunction with inhibited sexual excitement    S/P mitral valve replacement    Syncope and collapse    Unspecified sleep apnea    last sleep study 11/07, uses cpap   Ventricular tachycardia (HCC)    VT (ventricular tachycardia) (HCC)    Past Surgical History:  Procedure Laterality Date   BIV ICD GENERATOR CHANGEOUT N/A 05/04/2017   Procedure: BiV ICD Generator Changeout;  Surgeon: Marinus Maw, MD;  Location: [facility] INVASIVE CV LAB;  Service: Cardiovascular;  Laterality: N/A;   CARDIAC CATHETERIZATION  04/24/2002   CARDIAC VALVE REPLACEMENT     CARDIOVERSION  03/09/2012   Procedure: CARDIOVERSION;  Surgeon: Marinus Maw, MD;  Location: [facility] ENDOSCOPY;  Service: Cardiovascular;  Laterality: N/A;   CARDIOVERSION N/A 11/22/2013   Procedure: CARDIOVERSION;  Surgeon: Thurmon Fair, MD;  Location: [facility] ENDOSCOPY;  Service: Cardiovascular;  Laterality: N/A;   dental implants     ELECTROPHYSIOLOGIC STUDY N/A 09/15/2015   Procedure: V Tach Ablation;  Surgeon: Marinus Maw, MD;  Location: [facility] INVASIVE CV LAB;  Service: Cardiovascular;  Laterality: N/A;   Evacution of epidural lumbar epidural abscess  07/26/1997   INCISION AND DRAINAGE OF WOUND Left 03/25/2023   Procedure: Washout of Chest Wall Hematoma;  Surgeon: Nada Libman, MD;  Location: [facility] OR;  Service: Vascular;  Laterality: Left;   INCISION AND DRAINAGE OF WOUND N/A 03/29/2023   Procedure: IRRIGATION AND DEBRIDEMENT  CHEST WALL;  Surgeon: Nada Libman, MD;  Location: [facility] OR;  Service: Vascular;  Laterality:  N/A;   INSERT / REPLACE / REMOVE PACEMAKER  11/23/2008   MITRAL VALVE REPLACEMENT     w #[facility]   PERMANENT PACEMAKER INSERTION N/A 08/05/2011   Procedure: PERMANENT PACEMAKER INSERTION;  Surgeon: Marinus Maw, MD;  Location: [facility] CATH LAB;  Service: Cardiovascular;  Laterality: N/A;   REMOVAL OF BAROSTIM N/A 04/12/2023   Procedure: REMOVAL OF [name];  Surgeon: Nada Libman, MD;  Location: [facility] OR;  Service: Vascular;   Laterality: N/A;   RIGHT HEART CATH N/A 02/26/2021   Procedure: RIGHT HEART CATH;  Surgeon: Laurey Morale, MD;  Location: [facility] INVASIVE CV LAB;  Service: Cardiovascular;  Laterality: N/A;   THYROIDECTOMY     TONSILLECTOMY     V-TACH ABLATION  07/30/2021   in [facility]   VALVE REPLACEMENT  07/26/1998     A IV Location/Drains/Wounds Patient Lines/Drains/Airways Status     Active Line/Drains/Airways     Name Placement date Placement time Site Days   Peripheral IV 04/29/23 20 G Anterior;Distal;Right;Upper Arm 04/29/23  1601  Arm  less than 1   Wound / Incision (Open or Dehisced) 03/23/23 Other (Comment) Hand Left;Posterior Blood-filled blister 03/23/23  2245  Hand  37            Intake/Output Last 24 hours No intake or output data in the 24 hours ending 04/29/23 2046  Labs/Imaging Results for orders placed or performed during the hospital encounter of 04/29/23 (from the past 48 hour(s))  Brain natriuretic peptide     Status: Abnormal   Collection Time: 04/29/23  3:42 PM  Result Value Ref Range   B Natriuretic Peptide 1,194.1 (H) 0.0 - 100.0 pg/mL    Comment: Performed at [facility] Lab, [address] [place], [place] [address]  CBC with Differential     Status: Abnormal   Collection Time: 04/29/23  3:42 PM  Result Value Ref Range   WBC 11.7 (H) 4.0 - 10.5 K/uL   RBC 3.02 (L) 4.22 - 5.81 MIL/uL   Hemoglobin 9.3 (L) 13.0 - 17.0 g/dL   HCT 60.7 (L) 37.1 - 06.2 %   MCV 99.0 80.0 - 100.0 fL   MCH 30.8 26.0 - 34.0 pg   MCHC 31.1 30.0 - 36.0 g/dL   RDW 69.4 (H) 85.4 - 62.7 %   Platelets  150 - 400 K/uL    PLATELET CLUMPS NOTED ON SMEAR, COUNT APPEARS DECREASED   nRBC 0.0 0.0 - 0.2 %   Neutrophils Relative % 83 %   Neutro Abs 9.7 (H) 1.7 - 7.7 K/uL   Lymphocytes Relative 4 %   Lymphs Abs 0.5 (L) 0.7 - 4.0 K/uL   Monocytes Relative 9 %   Monocytes Absolute 1.0 0.1 - 1.0 K/uL   Eosinophils Relative 2 %   Eosinophils Absolute 0.3 0.0 - 0.5 K/uL   Basophils Relative 1 %    Basophils Absolute 0.1 0.0 - 0.1 K/uL   Immature Granulocytes 1 %   Abs Immature Granulocytes 0.15 (H) 0.00 - 0.07 K/uL    Comment: Performed at [facility] Lab, [address], [place], [place] [address]  Protime-INR     Status: Abnormal   Collection Time: 04/29/23  3:42 PM  Result Value Ref Range   Prothrombin Time 29.4 (H) 11.4 - 15.2 seconds   INR 2.8 (H) 0.8 - 1.2    Comment: (NOTE) INR goal varies based on device and disease states. Performed at [facility] Lab, [address]  [place], [place] [address]   Comprehensive metabolic panel     Status: Abnormal   Collection Time: 04/29/23  5:20 PM  Result Value Ref Range   Sodium 134 (L) 135 - 145 mmol/L   Potassium 3.4 (L) 3.5 - 5.1 mmol/L    Comment: HEMOLYSIS AT THIS LEVEL MAY AFFECT RESULT   Chloride 94 (L) 98 - 111 mmol/L   CO2 30 22 - 32 mmol/L   Glucose, Bld 94 70 - 99 mg/dL    Comment: Glucose reference range applies only to samples taken after fasting for at least 8 hours.   BUN 37 (H) 8 - 23 mg/dL   Creatinine, Ser 6.04 (H) 0.61 - 1.24 mg/dL   Calcium 7.8 (L) 8.9 - 10.3 mg/dL   Total Protein 5.3 (L) 6.5 - 8.1 g/dL   Albumin 2.8 (L) 3.5 - 5.0 g/dL   AST 49 (H) 15 - 41 U/L    Comment: HEMOLYSIS AT THIS LEVEL MAY AFFECT RESULT   ALT 26 0 - 44 U/L    Comment: HEMOLYSIS AT THIS LEVEL MAY AFFECT RESULT   Alkaline Phosphatase 83 38 - 126 U/L   Total Bilirubin 0.8 0.3 - 1.2 mg/dL    Comment: HEMOLYSIS AT THIS LEVEL MAY AFFECT RESULT   GFR, Estimated 22 (L) >60 mL/min    Comment: (NOTE) Calculated using the CKD-EPI Creatinine Equation (2021)    Anion gap 10 5 - 15    Comment: Performed at [facility] Lab, [address] [place], [place] [address]   *Note: Due to a large number of results and/or encounters for the requested time period, some results have not been displayed. A complete set of results can be found in Results Review.   CT Chest Wo Contrast  Result Date: 04/29/2023 CLINICAL DATA:  Postoperative  swelling. Recent removal of marrow stem device. JP drain removed 4 days ago. EXAM: CT CHEST WITHOUT CONTRAST TECHNIQUE: Multidetector CT imaging of the chest was performed following the standard protocol without IV contrast. RADIATION DOSE REDUCTION: This exam was performed according to the departmental dose-optimization program which includes automated exposure control, adjustment of the mA and/or kV according to patient size and/or use of iterative reconstruction technique. COMPARISON:  Radiograph 04/09/2023. FINDINGS: Cardiovascular: The heart is enlarged. Prosthetic mitral valve. Pacemaker in place. No pericardial effusion. Aortic atherosclerosis without aneurysm. No pericardial effusion. Mediastinum/Nodes: No mediastinal adenopathy. Assessment for hilar adenopathy is limited in the absence of IV contrast. No definite enlarged axillary nodes. Left lobe of the thyroid gland is diminutive or absent. Decompressed esophagus. Lungs/Pleura: Anterior left upper lobe opacity with air bronchogram is indeterminate for atelectasis or infection. Compressive atelectasis in the right lower lobe related to elevated hemidiaphragm. No pleural effusion or features of pulmonary edema. Upper Abdomen: Fatty atrophy of the pancreas. No acute upper abdominal findings. Musculoskeletal: Ill-defined fluid collection in the left anterior chest wall. Insert measurements are difficult due to irregular shape, however this measures approximately 13.8 x 4.6 x 10.4 cm, series 3, image 62 and series 5, image 101. This contains an air-fluid level medially. Adjacent strandy density and ill-defined fluid anteriorly and tracking inferiorly. Mild bilateral gynecomastia. Skin staples in the anterior left upper chest wall at the superior aspect of fluid collection. There are no acute or suspicious osseous abnormalities. Bilateral gynecomastia. Chronic bilateral shoulder arthropathy. Prior median sternotomy. IMPRESSION: 1. Ill-defined fluid collection  in the left anterior chest wall measuring approximately 13.8 x 4.6 x 10.4 cm. This contains an air-fluid level medially. This  may represent a postoperative seroma, hematoma, or abscess. Sterility is indeterminate by imaging. 2. Anterior left upper lobe opacity with air bronchogram is indeterminate for atelectasis or infection. 3. Cardiomegaly. Aortic Atherosclerosis (ICD10-I70.0). Electronically Signed   By: Narda Rutherford M.D.   On: 04/29/2023 18:35    Pending Labs Unresulted Labs (From admission, onward)     Start     Ordered   04/30/23 0500  Basic metabolic panel  Tomorrow morning,   R        04/29/23 2027   04/30/23 0500  Magnesium  Tomorrow morning,   R        04/29/23 2027   04/30/23 0500  CBC with Differential/Platelet  Tomorrow morning,   R        04/29/23 2027   04/30/23 0500  Protime-INR  Tomorrow morning,   R        04/29/23 2027            Vitals/Pain Today's Vitals   04/29/23 1915 04/29/23 1945 04/29/23 2015 04/29/23 2030  BP: 104/72 103/68 103/72 101/68  Pulse: 85 84 83 84  Resp: (!) 21 18 20 14   Temp: (!) 97.3 F (36.3 C)     TempSrc: Oral     SpO2: 97% 94% 98% 94%  PainSc:        Isolation Precautions No active isolations  Medications Medications  acetaminophen (TYLENOL) tablet 650 mg (has no administration in time range)    Or  acetaminophen (TYLENOL) suppository 650 mg (has no administration in time range)  senna-docusate (Senokot-S) tablet 1 tablet (has no administration in time range)  prochlorperazine (COMPAZINE) injection 5 mg (has no administration in time range)    Mobility walks     Focused Assessments     R Recommendations: See Admitting Provider Note  Report given to:   Additional Notes:  New JP drain in left chest.

## 2023-04-29 NOTE — ED Triage Notes (Signed)
Pt here from [facility] with a buildup of fluid to the left side of his chest , just had a jp drain removed from that same area 4 days ago

## 2023-04-30 ENCOUNTER — Observation Stay (HOSPITAL_COMMUNITY): Payer: Medicare Other

## 2023-04-30 DIAGNOSIS — L7634 Postprocedural seroma of skin and subcutaneous tissue following other procedure: Secondary | ICD-10-CM | POA: Diagnosis not present

## 2023-04-30 DIAGNOSIS — T8141XA Infection following a procedure, superficial incisional surgical site, initial encounter: Secondary | ICD-10-CM | POA: Diagnosis not present

## 2023-04-30 HISTORY — PX: IR US GUIDE BX ASP/DRAIN: IMG2392

## 2023-04-30 LAB — BASIC METABOLIC PANEL
Anion gap: 15 (ref 5–15)
BUN: 33 mg/dL — ABNORMAL HIGH (ref 8–23)
CO2: 29 mmol/L (ref 22–32)
Calcium: 7.9 mg/dL — ABNORMAL LOW (ref 8.9–10.3)
Chloride: 95 mmol/L — ABNORMAL LOW (ref 98–111)
Creatinine, Ser: 2.66 mg/dL — ABNORMAL HIGH (ref 0.61–1.24)
GFR, Estimated: 24 mL/min — ABNORMAL LOW (ref >60)
Glucose, Bld: 87 mg/dL (ref 70–99)
Potassium: 2.7 mmol/L — CL (ref 3.5–5.1)
Sodium: 139 mmol/L (ref 135–145)

## 2023-04-30 LAB — CBC WITH DIFFERENTIAL/PLATELET
Abs Immature Granulocytes: 0.08 10*3/uL — ABNORMAL HIGH (ref 0.00–0.07)
Basophils Absolute: 0.1 10*3/uL (ref 0.0–0.1)
Basophils Relative: 1 %
Eosinophils Absolute: 0.3 10*3/uL (ref 0.0–0.5)
Eosinophils Relative: 3 %
HCT: 28.7 % — ABNORMAL LOW (ref 39.0–52.0)
Hemoglobin: 9 g/dL — ABNORMAL LOW (ref 13.0–17.0)
Immature Granulocytes: 1 %
Lymphocytes Relative: 4 %
Lymphs Abs: 0.4 10*3/uL — ABNORMAL LOW (ref 0.7–4.0)
MCH: 30.9 pg (ref 26.0–34.0)
MCHC: 31.4 g/dL (ref 30.0–36.0)
MCV: 98.6 fL (ref 80.0–100.0)
Monocytes Absolute: 0.9 10*3/uL (ref 0.1–1.0)
Monocytes Relative: 10 %
Neutro Abs: 7.3 10*3/uL (ref 1.7–7.7)
Neutrophils Relative %: 81 %
Platelets: 119 10*3/uL — ABNORMAL LOW (ref 150–400)
RBC: 2.91 MIL/uL — ABNORMAL LOW (ref 4.22–5.81)
RDW: 18 % — ABNORMAL HIGH (ref 11.5–15.5)
WBC: 9 10*3/uL (ref 4.0–10.5)
nRBC: 0 % (ref 0.0–0.2)

## 2023-04-30 LAB — PROTIME-INR
INR: 3 — ABNORMAL HIGH (ref 0.8–1.2)
INR: 2.6 — ABNORMAL HIGH (ref 0.8–1.2)
Prothrombin Time: 31.5 s — ABNORMAL HIGH (ref 11.4–15.2)
Prothrombin Time: 28.4 s — ABNORMAL HIGH (ref 11.4–15.2)

## 2023-04-30 LAB — ABO/RH: ABO/RH(D): A POS

## 2023-04-30 LAB — MAGNESIUM: Magnesium: 2.1 mg/dL (ref 1.7–2.4)

## 2023-04-30 MED ORDER — LIDOCAINE HCL 1 % IJ SOLN
INTRAMUSCULAR | Status: AC
  Filled 2023-04-30: qty 20

## 2023-04-30 MED ORDER — POTASSIUM CHLORIDE CRYS ER 20 MEQ PO TBCR
40.0000 meq | EXTENDED_RELEASE_TABLET | Freq: Once | ORAL | Status: AC
  Administered 2023-04-30: 40 meq via ORAL
  Filled 2023-04-30: qty 2

## 2023-04-30 MED ORDER — LIDOCAINE HCL 1 % IJ SOLN
20.0000 mL | Freq: Once | INTRAMUSCULAR | Status: AC
  Administered 2023-04-30: 7 mL

## 2023-04-30 MED ORDER — SODIUM CHLORIDE 0.9% IV SOLUTION: Freq: Once | INTRAVENOUS | Status: AC

## 2023-04-30 MED ORDER — IOHEXOL 300 MG/ML  SOLN: 50.0000 mL | Freq: Once | INTRAMUSCULAR | Status: DC | PRN

## 2023-04-30 NOTE — Evaluation (Signed)
Physical Therapy Evaluation Patient Details Name: Austin [name] MRN: [id number] DOB: [birth year] Today's Date: 04/30/2023  History of Present Illness  Pt is an 82 y.o. male who had a prior admission on 04/06/23 with ICD shocks. Of note, pt underwent Barostim device 03/17/23. S/p removal of L sided Barostim device and redo L carotid artery exposure 9/17. However, then returned to ED from SNF as he developed fluid in L chest wall PMH: Mechanical MV s/p MV endocarditis, VT, NICM, CHB and PAF/AFL, CKD, CHF, syncope, sleep apnea   Clinical Impression  Pt admitted for above. Pt functioning at contact guard/ minA level. Pt remains to demo decreased activity tolerance, generalized deconditioning, and  impaired balance/increased fall risk. Pt mobilizing better now that drain is placed. Recommend returning to [facility] to continue with therapy program. Pt however would like to optimally return home with home health services. Acute PT to cont to follow and reassess d/c recommendations.       If plan is discharge home, recommend the following: A little help with walking and/or transfers;A little help with bathing/dressing/bathroom;Assistance with cooking/housework;Assist for transportation   Can travel by private vehicle   Yes    Equipment Recommendations Other (comment) (TBA)  Recommendations for Other Services       Functional Status Assessment Patient has had a recent decline in their functional status and demonstrates the ability to make significant improvements in function in a reasonable and predictable amount of time.     Precautions / Restrictions Precautions Precautions: Fall Precaution Comments: JP drain L chest Restrictions Weight Bearing Restrictions: No      Mobility  Bed Mobility Overal bed mobility: Needs Assistance Bed Mobility: Rolling, Supine to Sit Rolling: Min assist Sidelying to sit: Min assist   Sit to supine: Contact guard assist   General bed  mobility comments: HHA to elevate trunk    Transfers Overall transfer level: Needs assistance Equipment used: Rolling walker (2 wheels) Transfers: Sit to/from Stand Sit to Stand: Contact guard assist           General transfer comment: slightly impulsive, verbal cues to push up from bed not pull up on walker, contact guard for safety when returning to sitting    Ambulation/Gait Ambulation/Gait assistance: Min assist, Contact guard assist Gait Distance (Feet): 200 Feet Assistive device: Rollator (4 wheels) Gait Pattern/deviations: Step-through pattern, Decreased stride length, Trunk flexed Gait velocity: reduced     General Gait Details: pt with increased trunk flexion due to low rollator, pt wiht narrow base of support however was able to clear each foot  Stairs            Wheelchair Mobility     Tilt Bed    Modified Rankin (Stroke Patients Only)       Balance Overall balance assessment: Needs assistance Sitting-balance support: Feet supported Sitting balance-Leahy Scale: Good     Standing balance support: Bilateral upper extremity supported, No upper extremity supported, During functional activity Standing balance-Leahy Scale: Fair                               Pertinent Vitals/Pain Pain Assessment Pain Assessment: No/denies pain    Home Living Family/patient expects to be discharged to:: Skilled nursing facility Living Arrangements: Spouse/significant other Available Help at Discharge: Family Type of Home: Independent living facility Home Access: Level entry       Home Layout: One level Home Equipment: Rollator (4 wheels) Additional Comments:  Pt reports depending on how they do would like to go home versus returning to SNF    Prior Function Prior Level of Function : Independent/Modified Independent (however has been at [facility] for rehab since initial admission in september)             Mobility Comments: Used  rollator for community mobility but no device inside his home ADLs Comments: No assist for ADL and iADL     Extremity/Trunk Assessment   Upper Extremity Assessment Upper Extremity Assessment: Overall WFL for tasks assessed    Lower Extremity Assessment Lower Extremity Assessment: Generalized weakness (pt reports L LE overall weaker than R stating its due to bad circulation)    Cervical / Trunk Assessment Cervical / Trunk Assessment: Normal  Communication   Communication Communication: No apparent difficulties  Cognition Arousal: Alert Behavior During Therapy: WFL for tasks assessed/performed Overall Cognitive Status: Within Functional Limits for tasks assessed                                          General Comments General comments (skin integrity, edema, etc.): VSS    Exercises     Assessment/Plan    PT Assessment Patient needs continued PT services  PT Problem List Decreased strength;Decreased activity tolerance;Decreased balance;Decreased mobility;Decreased knowledge of precautions;Cardiopulmonary status limiting activity;Pain       PT Treatment Interventions DME instruction;Gait training;Functional mobility training;Therapeutic activities;Therapeutic exercise;Balance training;Neuromuscular re-education;Patient/family education    PT Goals (Current goals can be found in the Care Plan section)  Acute Rehab PT Goals Patient Stated Goal: to improve PT Goal Formulation: With patient/family Time For Goal Achievement: 05/14/23 Potential to Achieve Goals: Good    Frequency Min 1X/week     Co-evaluation               AM-PAC PT "6 Clicks" Mobility  Outcome Measure Help needed turning from your back to your side while in a flat bed without using bedrails?: A Little Help needed moving from lying on your back to sitting on the side of a flat bed without using bedrails?: A Little Help needed moving to and from a bed to a chair (including a  wheelchair)?: A Little Help needed standing up from a chair using your arms (e.g., wheelchair or bedside chair)?: A Little Help needed to walk in hospital room?: A Little Help needed climbing 3-5 steps with a railing? : A Lot 6 Click Score: 17    End of Session   Activity Tolerance: Patient tolerated treatment well Patient left: with call bell/phone within reach;in bed Nurse Communication: Mobility status PT Visit Diagnosis: Other abnormalities of gait and mobility (R26.89);Muscle weakness (generalized) (M62.81) Pain - Right/Left: Left Pain - part of body:  (chest)    Time: 6295-2841 PT Time Calculation (min) (ACUTE ONLY): 25 min   Charges:   PT Evaluation $PT Eval Moderate Complexity: 1 Mod PT Treatments $Gait Training: 8-22 mins PT General Charges $$ ACUTE PT VISIT: 1 Visit         Lewis Shock, PT, DPT Acute Rehabilitation Services Secure chat preferred Office #: [phone]   Iona Hansen 04/30/2023, 4:17 PM

## 2023-04-30 NOTE — Care Management Obs Status (Signed)
MEDICARE OBSERVATION STATUS NOTIFICATION   Patient Details  Name: Austin [name] MRN: [id number] Date of Birth: 1941-06-03   Medicare Observation Status Notification Given:  Yes    Ronny Bacon, RN 04/30/2023, 7:38 AM

## 2023-04-30 NOTE — Progress Notes (Signed)
Patient sleeping o2 sats drop to 80%. Patient does appear to have slept apnea. Applied 02 2 l/m [place] Tim RN aware.

## 2023-04-30 NOTE — Evaluation (Signed)
Occupational Therapy Evaluation Patient Details Name: Austin [name] MRN: [id number] DOB: 1941/06/12 Today's Date: 04/30/2023   History of Present Illness Pt is an 82 y.o. male who had a prior admission on 04/06/23 with ICD shocks. Of note, pt underwent Barostim device 03/17/23. S/p removal of L sided Barostim device and redo L carotid artery exposure 9/17. However, then returned to ED from SNF as he developed fluid in L chest wall PMH: Mechanical MV s/p MV endocarditis, VT, NICM, CHB and PAF/AFL, CKD, CHF, syncope, sleep apnea   Clinical Impression   Pt presented in bed and agreed to get OOB at this time. Pt required min assist for supine to sitting, CGA  for sitting to standing from elevated surfaces and CGA with RW for room level ambulation but pt reported needing to rest. Pt was able to complete UE dressing with set up and min assist for LB dressing. Patient will benefit from continued inpatient follow up therapy, <3 hours/day.       If plan is discharge home, recommend the following: A little help with bathing/dressing/bathroom;A little help with walking and/or transfers;Assist for transportation;Assistance with cooking/housework    Functional Status Assessment  Patient has had a recent decline in their functional status and demonstrates the ability to make significant improvements in function in a reasonable and predictable amount of time.  Equipment Recommendations  None recommended by OT    Recommendations for Other Services       Precautions / Restrictions Precautions Precautions: Fall Restrictions Weight Bearing Restrictions: No      Mobility Bed Mobility Overal bed mobility: Needs Assistance Bed Mobility: Rolling, Supine to Sit Rolling: Min assist   Supine to sit: Min assist          Transfers Overall transfer level: Needs assistance Equipment used: Rolling walker (2 wheels) Transfers: Sit to/from Stand Sit to Stand: Contact guard assist                   Balance Overall balance assessment: Needs assistance Sitting-balance support: Feet supported Sitting balance-Leahy Scale: Good     Standing balance support: Bilateral upper extremity supported, No upper extremity supported, During functional activity Standing balance-Leahy Scale: Fair                             ADL either performed or assessed with clinical judgement   ADL Overall ADL's : Needs assistance/impaired Eating/Feeding: Independent;Sitting   Grooming: Wash/dry hands;Wash/dry face;Contact guard assist   Upper Body Bathing: Set up;Sitting   Lower Body Bathing: Contact guard assist;Minimal assistance;Sit to/from stand   Upper Body Dressing : Set up;Sitting   Lower Body Dressing: Contact guard assist;Minimal assistance;Sit to/from stand   Toilet Transfer: Contact guard assist   Toileting- Clothing Manipulation and Hygiene: Contact guard assist;Sit to/from stand       Functional mobility during ADLs: Contact guard assist;Rolling walker (2 wheels)       Vision         Perception         Praxis         Pertinent Vitals/Pain Pain Assessment Pain Assessment: No/denies pain     Extremity/Trunk Assessment Upper Extremity Assessment Upper Extremity Assessment: Overall WFL for tasks assessed   Lower Extremity Assessment Lower Extremity Assessment: Defer to PT evaluation   Cervical / Trunk Assessment Cervical / Trunk Assessment: Normal   Communication Communication Communication: No apparent difficulties   Cognition Arousal: Alert Behavior During Therapy: [facility]  for tasks assessed/performed Overall Cognitive Status: Within Functional Limits for tasks assessed                                       General Comments       Exercises     Shoulder Instructions      Home Living Family/patient expects to be discharged to:: Skilled nursing facility Living Arrangements: Spouse/significant other Available Help at  Discharge: Family Type of Home: Independent living facility Home Access: Level entry     Home Layout: One level     Bathroom Shower/Tub: Producer, television/film/video: Handicapped height Bathroom Accessibility: Yes How Accessible: Accessible via wheelchair;Accessible via walker Home Equipment: Rollator (4 wheels)   Additional Comments: Pt reports depending on how they do would like to go home versus returning to SNF      Prior Functioning/Environment Prior Level of Function : Independent/Modified Independent             Mobility Comments: Used rollator for community mobility but no device inside his home ADLs Comments: No assist for ADL and iADL        OT Problem List: Decreased strength;Decreased activity tolerance;Impaired balance (sitting and/or standing)      OT Treatment/Interventions: Self-care/ADL training;Therapeutic activities;Balance training;Patient/family education    OT Goals(Current goals can be found in the care plan section) Acute Rehab OT Goals Patient Stated Goal[facility] Goal Formulation: With patient Time For Goal Achievement: 05/14/23 Potential to Achieve Goals: Good  OT Frequency: Min 1X/week    Co-evaluation              AM-PAC OT "6 Clicks" Daily Activity     Outcome Measure Help from another person eating meals?: None Help from another person taking care of personal grooming?: None Help from another person toileting, which includes using toliet, bedpan, or urinal?: A Little Help from another person bathing (including washing, rinsing, drying)?: A Little Help from another person to put on and taking off regular upper body clothing?: None Help from another person to put on and taking off regular lower body clothing?: A Little 6 Click Score: 21   End of Session Equipment Utilized During Treatment: Rolling walker (2 wheels) Nurse Communication: Mobility status  Activity Tolerance: Patient tolerated treatment  well Patient left: in chair;with call bell/phone within reach  OT Visit Diagnosis: Unsteadiness on feet (R26.81);Muscle weakness (generalized) (M62.81)                Time: 0981-1914 OT Time Calculation (min): 41 min Charges:  OT General Charges $OT Visit: 1 Visit OT Evaluation $OT Eval Moderate Complexity: 1 Mod OT Treatments $Self Care/Home Management : 23-37 mins  Presley Raddle OTR/L  Acute Rehab Services  [phone] office number   Alphia Moh 04/30/2023, 10:28 AM

## 2023-04-30 NOTE — Progress Notes (Signed)
Dr Arlean Hopping text paged results Potassium 2.7. Tim RN aware

## 2023-04-30 NOTE — Progress Notes (Signed)
[facility] night cross cover note:   I was notified by RN of the patient's potassium level of 2.7 this morning.  Corresponding magnesium level 2.1.  I subsequently ordered Kcl 40 meq po q4 hours x 2 doses.     Newton Pigg, DO Hospitalist

## 2023-04-30 NOTE — Procedures (Signed)
Interventional Radiology Procedure:   Indications: Recurrent left chest wall seroma / fluid collection  Procedure: US guided drain placement  Findings: 12 Fr drain placed with [place].  Removed greater than 150 ml of brown fluid.   Complications: None     EBL: Minimal  Plan: Drain attached to suction bulb.  Fluid sent for culture.   Placida Cambre R. Lowella Dandy, MD  Pager: [phone]

## 2023-04-30 NOTE — Progress Notes (Signed)
   04/30/23 2011  BiPAP/CPAP/SIPAP  Reason BIPAP/CPAP not in use Non-compliant  BiPAP/CPAP /SiPAP Vitals  Pulse Rate (!) 59  SpO2 (!) 72 %  MEWS Score/Color  MEWS Score 0  MEWS Score Color Green   Pt did not bring his CPAP in to hospital and does not want to use ours.

## 2023-04-30 NOTE — Progress Notes (Signed)
PROGRESS NOTE    Austin [name]  WUJ:[id number] DOB: August 20, 1940 DOA: 04/29/2023 PCP: [name], MD   Brief Narrative:  This is a 82 year old male with past medical history of chronic atrial fibrillation on chronic anticoagulation, history of mechanical mitral valve replacement, CKD stage IIIb, hypothyroidism, HFrEF, AICD placement, bladder stimulator placement.  Patient previously had notable chest wall hematoma status post neurostimulator placement infected with Pseudomonas, stimulator was removed (9/17) patient was treated with antibiotics.  Drain was subsequently removed 10 days prior to admission without complication.  Acutely over the previous 3 days patient had worsening chest wall pain and tightness.  Hospitalist called for admission, CT surgery and IR called in consult.  Assessment & Plan:   Principal Problem:   Postprocedural seroma of skin and subcutaneous tissue following other procedure Active Problems:   Atrial fibrillation (HCC)   Hypothyroidism   H/O mitral valve replacement with mechanical valve   Chronic anticoagulation   Stage 3b chronic kidney disease (HCC)   OSA on CPAP  Postprocedural left chest wall seroma after removal of Barostim device, POA -CT surgery and IR consulted, tentative plan for drain placement 10/5 -Coumadin on hold INR 3.0, FFP ongoing  Rule out GI bleed -Occult negative at intake -Blood noted at facility, given transient nature likely hemorrhoid related -Follow labs, transfuse if symptomatic or less than 7 -Coumadin on hold as above   Hypokalemia -2.7 at intake, repleted -likely in setting of diuretic use   Chronic atrial fibrillation //chronic anticoagulation //history of mechanical MVR -Amiodarone ongoing, transition from Coumadin to heparin for procedure  -INR 3.0   Stage 4 chronic kidney disease  -Stable   OSA -CPAP ordered   Hypothyroidism -Synthroid ordered   HFrEF -Not in acute exacerbation, appears euvolemic,  torsemide continued -Follow potassium as above may need chronic supplementation  DVT prophylaxis:    Code Status:   Code Status: Full Code  Family Communication: None present  Status is: Inpatient  Dispo: The patient is from: SNF              Anticipated d/c is to: SNF              Anticipated d/c date is: 48 to 72 hours              Patient currently not medically stable for discharge  Consultants:  CT surgery, IR  Procedures:  Drain placement pending above  Antimicrobials:  None  Subjective: No acute issues or events overnight denies nausea vomiting diarrhea constipation headache fevers chills or chest pain  Objective: Vitals:   04/29/23 2336 04/29/23 2337 04/30/23 0347 04/30/23 0546  BP: (!) 90/57 103/64 104/67   Pulse: 84 85 84   Resp: 19 16 18    Temp:   98.2 F (36.8 C)   TempSrc:   Oral   SpO2: 96% 99% 96%   Weight:    88.9 kg  Height:    6\' 1"  (1.854 m)    Intake/Output Summary (Last 24 hours) at 04/30/2023 0816 Last data filed at 04/30/2023 0134 Gross per 24 hour  Intake 240 ml  Output 250 ml  Net -10 ml   Filed Weights   04/30/23 0546  Weight: 88.9 kg    Examination:  General:  Pleasantly resting in bed, No acute distress. HEENT:  Normocephalic atraumatic.  Sclerae nonicteric, noninjected.  Extraocular movements intact bilaterally. Neck:  Without mass or deformity.  Trachea is midline. Lungs:  Clear to auscultate bilaterally without rhonchi, wheeze, or rales.  Heart:  Regular rate and rhythm.  Without murmurs, rubs, or gallops. Abdomen:  Soft, nontender, nondistended.  Without guarding or rebound. Extremities: Without cyanosis, clubbing, edema, or obvious deformity. Skin:  Warm and dry, no erythema.  Data Reviewed: I have personally reviewed following labs and imaging studies  CBC: Recent Labs  Lab 04/29/23 1542 04/30/23 0400  WBC 11.7* 9.0  NEUTROABS 9.7* 7.3  HGB 9.3* 9.0*  HCT 29.9* 28.7*  MCV 99.0 98.6  PLT PLATELET CLUMPS NOTED  ON SMEAR, COUNT APPEARS DECREASED 119*   Basic Metabolic Panel: Recent Labs  Lab 04/29/23 1720 04/30/23 0400  NA 134* 139  K 3.4* 2.7*  CL 94* 95*  CO2 30 29  GLUCOSE 94 87  BUN 37* 33*  CREATININE 2.83* 2.66*  CALCIUM 7.8* 7.9*  MG  --  2.1   GFR: Estimated Creatinine Clearance: 25 mL/min (A) (by C-G formula based on SCr of 2.66 mg/dL (H)). Liver Function Tests: Recent Labs  Lab 04/29/23 1720  AST 49*  ALT 26  ALKPHOS 83  BILITOT 0.8  PROT 5.3*  ALBUMIN 2.8*   No results for input(s): "LIPASE", "AMYLASE" in the last 168 hours. No results for input(s): "AMMONIA" in the last 168 hours. Coagulation Profile: Recent Labs  Lab 04/26/23 0000 04/29/23 1542 04/30/23 0400  INR 3.00* 2.8* 3.0*   Cardiac Enzymes: No results for input(s): "CKTOTAL", "CKMB", "CKMBINDEX", "TROPONINI" in the last 168 hours. BNP (last 3 results) Recent Labs    12/16/22 0959  PROBNP 3,692*   HbA1C: No results for input(s): "HGBA1C" in the last 72 hours. CBG: No results for input(s): "GLUCAP" in the last 168 hours. Lipid Profile: No results for input(s): "CHOL", "HDL", "LDLCALC", "TRIG", "CHOLHDL", "LDLDIRECT" in the last 72 hours. Thyroid Function Tests: No results for input(s): "TSH", "T4TOTAL", "FREET4", "T3FREE", "THYROIDAB" in the last 72 hours. Anemia Panel: No results for input(s): "VITAMINB12", "FOLATE", "FERRITIN", "TIBC", "IRON", "RETICCTPCT" in the last 72 hours. Sepsis Labs: No results for input(s): "PROCALCITON", "LATICACIDVEN" in the last 168 hours.  No results found for this or any previous visit (from the past 240 hour(s)).       Radiology Studies: CT Chest Wo Contrast  Result Date: 04/29/2023 CLINICAL DATA:  Postoperative swelling. Recent removal of marrow stem device. JP drain removed 4 days ago. EXAM: CT CHEST WITHOUT CONTRAST TECHNIQUE: Multidetector CT imaging of the chest was performed following the standard protocol without IV contrast. RADIATION DOSE  REDUCTION: This exam was performed according to the departmental dose-optimization program which includes automated exposure control, adjustment of the mA and/or kV according to patient size and/or use of iterative reconstruction technique. COMPARISON:  Radiograph 04/09/2023. FINDINGS: Cardiovascular: The heart is enlarged. Prosthetic mitral valve. Pacemaker in place. No pericardial effusion. Aortic atherosclerosis without aneurysm. No pericardial effusion. Mediastinum/Nodes: No mediastinal adenopathy. Assessment for hilar adenopathy is limited in the absence of IV contrast. No definite enlarged axillary nodes. Left lobe of the thyroid gland is diminutive or absent. Decompressed esophagus. Lungs/Pleura: Anterior left upper lobe opacity with air bronchogram is indeterminate for atelectasis or infection. Compressive atelectasis in the right lower lobe related to elevated hemidiaphragm. No pleural effusion or features of pulmonary edema. Upper Abdomen: Fatty atrophy of the pancreas. No acute upper abdominal findings. Musculoskeletal: Ill-defined fluid collection in the left anterior chest wall. Insert measurements are difficult due to irregular shape, however this measures approximately 13.8 x 4.6 x 10.4 cm, series 3, image 62 and series 5, image 101. This contains an air-fluid level medially. Adjacent  strandy density and ill-defined fluid anteriorly and tracking inferiorly. Mild bilateral gynecomastia. Skin staples in the anterior left upper chest wall at the superior aspect of fluid collection. There are no acute or suspicious osseous abnormalities. Bilateral gynecomastia. Chronic bilateral shoulder arthropathy. Prior median sternotomy. IMPRESSION: 1. Ill-defined fluid collection in the left anterior chest wall measuring approximately 13.8 x 4.6 x 10.4 cm. This contains an air-fluid level medially. This may represent a postoperative seroma, hematoma, or abscess. Sterility is indeterminate by imaging. 2. Anterior left  upper lobe opacity with air bronchogram is indeterminate for atelectasis or infection. 3. Cardiomegaly. Aortic Atherosclerosis (ICD10-I70.0). Electronically Signed   By: Narda Rutherford M.D.   On: 04/29/2023 18:35    Scheduled Meds:  sodium chloride   Intravenous Once   amiodarone  400 mg Oral BID   levothyroxine  88 mcg Oral Daily   melatonin  5 mg Oral QHS   potassium chloride  40 mEq Oral Once   quiniDINE gluconate  324 mg Oral BID   torsemide  40 mg Oral BID   Continuous Infusions:   LOS: 0 days   Time spent:  Azucena Fallen, DO [facility]  If 7PM-7AM, please contact night-coverage [email or web address]  04/30/2023, 8:16 AM

## 2023-04-30 NOTE — Progress Notes (Addendum)
  Progress Note    04/30/2023 9:14 AM * No surgery found *  Subjective: No complaints    Vitals:   04/30/23 0347 04/30/23 0834  BP: 104/67 104/68  Pulse: 84 82  Resp: 18 19  Temp: 98.2 F (36.8 C) 97.6 F (36.4 C)  SpO2: 96% 100%    Physical Exam: General: Laying in bed, no acute distress Lungs: Nonlabored Incisions: Left-sided chest wall incision and neck incision well-appearing.  Left chest wall with fluid collection  CBC    Component Value Date/Time   WBC 9.0 04/30/2023 0400   RBC 2.91 (L) 04/30/2023 0400   HGB 9.0 (L) 04/30/2023 0400   HGB 16.6 12/04/2020 1445   HCT 28.7 (L) 04/30/2023 0400   HCT 50.1 12/04/2020 1445   PLT 119 (L) 04/30/2023 0400   PLT 158 12/04/2020 1445   MCV 98.6 04/30/2023 0400   MCV 94 12/04/2020 1445   MCH 30.9 04/30/2023 0400   MCHC 31.4 04/30/2023 0400   RDW 18.0 (H) 04/30/2023 0400   RDW 13.7 12/04/2020 1445   LYMPHSABS 0.4 (L) 04/30/2023 0400   MONOABS 0.9 04/30/2023 0400   EOSABS 0.3 04/30/2023 0400   BASOSABS 0.1 04/30/2023 0400    BMET    Component Value Date/Time   NA 139 04/30/2023 0400   NA 139 01/29/2021 0853   K 2.7 (LL) 04/30/2023 0400   CL 95 (L) 04/30/2023 0400   CO2 29 04/30/2023 0400   GLUCOSE 87 04/30/2023 0400   BUN 33 (H) 04/30/2023 0400   BUN 18 01/29/2021 0853   CREATININE 2.66 (H) 04/30/2023 0400   CREATININE 1.22 (H) 09/12/2015 0846   CALCIUM 7.9 (L) 04/30/2023 0400   GFRNONAA 24 (L) 04/30/2023 0400   GFRAA 56 (L) 04/30/2020 1144    INR    Component Value Date/Time   INR 3.0 (H) 04/30/2023 0400     Intake/Output Summary (Last 24 hours) at 04/30/2023 0914 Last data filed at 04/30/2023 0134 Gross per 24 hour  Intake 240 ml  Output 250 ml  Net -10 ml      Assessment/Plan:  82 y.o. male is s/p: barostim removal on 04/12/2023  -The patient underwent barostim removal for persistent seroma. A JP drain was placed during surgery and unfortunately fell out at some point in the past week -He  has recurrent fluid collection in his left chest wall -Plans for IR to place a drain at the left chest incision site. Unsure if this will happen today given INR of 3 -Continue to hold home Coumadin   [name], PA-C [facility] [phone] 04/30/2023 9:14 AM   VASCULAR STAFF ADDENDUM: I have independently interviewed and examined the patient. I agree with the above.   Rande Brunt. Lenell Antu, MD [facility] Phone Number: [phone] 04/30/2023 11:30 AM

## 2023-04-30 NOTE — Consult Note (Addendum)
Chief Complaint: Chest wall seroma  Referring Provider(s): Heath Lark  Supervising Physician: Richarda Overlie  Patient Status: [facility] - In-pt  History of Present Illness: Austin [name] is a 82 y.o. male with left chest wall fluid collection after placement of Barostim device for heart failure.  Unfortunately the device became infected with pseudomonas and was removed on 04/12/23.  The cavity was washed out and a JP left in place.   The JP drain was inadvertently removed, and the fluid collection returned.  CT scan done yesterday showed=  Ill-defined fluid collection in the left anterior chest wall measuring approximately 13.8 x 4.6 x 10.4 cm. This contains an air-fluid level medially. This may represent a postoperative seroma, hematoma, or abscess.      We are asked to place a drain.  He is NPO. No nausea/vomiting. No Fever/chills. ROS negative.    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   Conductive hearing loss    uses hearing aids   Dysrhythmia    A-fib   History of blood transfusion 03/24/2023   Hypothyroidism    Intraspinal abscess    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   Nonischemic cardiomyopathy (HCC)    Psychosexual dysfunction with inhibited sexual excitement    S/P mitral valve replacement    Syncope and collapse    Unspecified sleep apnea    last sleep study 11/07, uses cpap   Ventricular tachycardia (HCC)    VT (ventricular tachycardia) (HCC)     Past Surgical History:  Procedure Laterality Date   BIV ICD GENERATOR CHANGEOUT N/A 05/04/2017   Procedure: BiV ICD Generator Changeout;   Surgeon: Marinus Maw, MD;  Location: [facility] INVASIVE CV LAB;  Service: Cardiovascular;  Laterality: N/A;   CARDIAC CATHETERIZATION  04/24/2002   CARDIAC VALVE REPLACEMENT     CARDIOVERSION  03/09/2012   Procedure: CARDIOVERSION;  Surgeon: Marinus Maw, MD;  Location: [facility] ENDOSCOPY;  Service: Cardiovascular;  Laterality: N/A;   CARDIOVERSION N/A 11/22/2013   Procedure: CARDIOVERSION;  Surgeon: Thurmon Fair, MD;  Location: [facility] ENDOSCOPY;  Service: Cardiovascular;  Laterality: N/A;   dental implants     ELECTROPHYSIOLOGIC STUDY N/A 09/15/2015   Procedure: V Tach Ablation;  Surgeon: Marinus Maw, MD;  Location: [facility] INVASIVE CV LAB;  Service: Cardiovascular;  Laterality: N/A;   Evacution of epidural lumbar epidural abscess  07/26/1997   INCISION AND DRAINAGE OF WOUND Left 03/25/2023   Procedure: Washout of Chest Wall Hematoma;  Surgeon: Nada Libman, MD;  Location: [facility] OR;  Service: Vascular;  Laterality: Left;   INCISION AND DRAINAGE OF WOUND N/A 03/29/2023   Procedure: IRRIGATION AND DEBRIDEMENT  CHEST WALL;  Surgeon: Nada Libman, MD;  Location: [facility] OR;  Service: Vascular;  Laterality: N/A;   INSERT / REPLACE / REMOVE PACEMAKER  11/23/2008   MITRAL VALVE REPLACEMENT     w #[id number] [facility]   PERMANENT PACEMAKER INSERTION N/A 08/05/2011   Procedure: PERMANENT PACEMAKER INSERTION;  Surgeon: Marinus Maw, MD;  Location: [facility] CATH LAB;  Service: Cardiovascular;  Laterality: N/A;   REMOVAL OF BAROSTIM N/A 04/12/2023   Procedure: REMOVAL  OF [name];  Surgeon: Nada Libman, MD;  Location: [facility] OR;  Service: Vascular;  Laterality: N/A;   RIGHT HEART CATH N/A 02/26/2021   Procedure: RIGHT HEART CATH;  Surgeon: Laurey Morale, MD;  Location: [facility] INVASIVE CV LAB;  Service: Cardiovascular;  Laterality: N/A;   THYROIDECTOMY     TONSILLECTOMY     V-TACH ABLATION  07/30/2021   in CE   VALVE REPLACEMENT  07/26/1998    Allergies: Patient has no known allergies.  Medications: Prior to Admission  medications   Medication Sig Start Date End Date Taking? Authorizing Provider  acetaminophen (TYLENOL) 500 MG tablet Take 1,000 mg by mouth every 8 (eight) hours as needed for headache, fever or mild pain.   Yes [provider]  amiodarone (PACERONE) 200 MG tablet Take amio 400 mg twice a day 04/22/23  Yes Clegg, Amy D, NP  ciprofloxacin (CIPRO) 500 MG tablet Take 1 tablet (500 mg total) by mouth daily with breakfast. 04/23/23  Yes Clegg, Amy D, NP  levothyroxine (SYNTHROID) 88 MCG tablet TAKE 1 TABLET BY MOUTH EVERY DAY 03/23/23  Yes [name], MD  melatonin 5 MG TABS Take 5 mg by mouth at bedtime.   Yes [provider]  mirtazapine (REMERON) 7.5 MG tablet Take 1 tablet (7.5 mg total) by mouth at bedtime. 04/25/23  Yes Venita Sheffield, MD  Multiple Vitamin (MULTIVITAMIN WITH MINERALS) TABS tablet Take 1 tablet by mouth in the morning.   Yes [provider]  mupirocin ointment (BACTROBAN) 2 % Apply 1 Application topically daily. 03/01/23  Yes McDonald, Rachelle Hora, DPM  nitroGLYCERIN (NITROSTAT) 0.4 MG SL tablet Place 1 tablet (0.4 mg total) under the tongue every 5 (five) minutes x 3 doses as needed for chest pain. 10/14/18  Yes Kilroy, Luke K, PA-C  quiniDINE gluconate 324 MG CR tablet TAKE 1 TABLET BY MOUTH 2 TIMES DAILY. 01/25/23  Yes Laurey Morale, MD  torsemide (DEMADEX) 20 MG tablet Take 2 tablets (40 mg total) by mouth 2 (two) times daily. 04/22/23  Yes Clegg, Amy D, NP  warfarin (COUMADIN) 2.5 MG tablet Take 1 tablet (2.5 mg total) by mouth daily at 4 PM. 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  Has one daughter.  Retired.  Education: Masters.   Social Determinants of Health   Financial Resource Strain: Low Risk  (03/08/2023)   Overall Financial Resource Strain (CARDIA)    Difficulty of Paying Living Expenses: Not hard at all  Food Insecurity: No Food Insecurity (04/29/2023)   Hunger Vital Sign    Worried About Running Out of Food in the Last Year: Never true    Ran Out of Food in the Last Year: Never true  Transportation Needs: No Transportation Needs (04/29/2023)   PRAPARE - Administrator, Civil Service (Medical): No    Lack of Transportation (Non-Medical): No  Physical Activity: Insufficiently Active (03/08/2023)   Exercise Vital Sign    Days of Exercise per Week: 4 days    Minutes of Exercise per Session: 30 min  Stress: No Stress Concern Present (03/08/2023)   [employer] of Occupational Health - Occupational Stress Questionnaire    Feeling of Stress : Not at all  Social Connections: Moderately Integrated (03/08/2023)   Social Connection and Isolation Panel [NHANES]    Frequency of Communication with Friends and Family: More than three times a week    Frequency of Social Gatherings with Friends and Family: Twice a week    Attends Religious Services: More than 4 times per year    Active Member of [employer] or Organizations: No    Attends Banker Meetings: Never    Marital Status: Married     Review of Systems: A 12 point ROS discussed and pertinent positives are indicated in the HPI above.  All other systems are  negative.    Vital Signs: BP 109/76   Pulse 84   Temp 98.4 F (36.9 C) (Oral)   Resp 16   Ht 6\' 1"  (1.854 m)   Wt 195 lb 15.8 oz (88.9 kg)   SpO2 99%   BMI 25.86 kg/m   Advance Care Plan: The advanced care place/surrogate decision maker was discussed at the time of visit and the patient did not wish to discuss or was not able to name a surrogate decision maker or provide an advance care plan.  Physical Exam Constitutional:      Appearance: Normal appearance.  HENT:     Head: Normocephalic and atraumatic.  Cardiovascular:     Rate and Rhythm: Normal rate.  Pulmonary:     Effort: Pulmonary effort is normal. No respiratory distress.  Skin:    General: Skin is warm and dry.  Neurological:     General: No focal deficit present.     Mental Status: He is alert and oriented to person, place, and time.  Psychiatric:        Judgment: Judgment normal.   Left chest wall fluid collection present  Imaging: CT Chest Wo Contrast  Result Date: 04/29/2023 CLINICAL DATA:  Postoperative swelling. Recent removal of marrow stem device. JP drain removed 4 days ago. EXAM: CT CHEST WITHOUT CONTRAST TECHNIQUE: Multidetector CT imaging of the chest was performed following the standard protocol without IV contrast. RADIATION DOSE REDUCTION: This exam was performed according to the departmental dose-optimization program which includes automated exposure control, adjustment of the mA and/or kV according to patient size and/or use of iterative reconstruction technique. COMPARISON:  Radiograph 04/09/2023. FINDINGS: Cardiovascular: The heart is enlarged. Prosthetic mitral valve. Pacemaker in place. No pericardial effusion. Aortic atherosclerosis without aneurysm. No pericardial effusion. Mediastinum/Nodes: No mediastinal adenopathy. Assessment for hilar adenopathy is limited in the absence of IV contrast. No definite enlarged axillary nodes. Left lobe of the thyroid gland is diminutive or absent. Decompressed  esophagus. Lungs/Pleura: Anterior left upper lobe opacity with air bronchogram is indeterminate for atelectasis or infection. Compressive atelectasis in the right lower lobe related to elevated hemidiaphragm. No pleural  effusion or features of pulmonary edema. Upper Abdomen: Fatty atrophy of the pancreas. No acute upper abdominal findings. Musculoskeletal: Ill-defined fluid collection in the left anterior chest wall. Insert measurements are difficult due to irregular shape, however this measures approximately 13.8 x 4.6 x 10.4 cm, series 3, image 62 and series 5, image 101. This contains an air-fluid level medially. Adjacent strandy density and ill-defined fluid anteriorly and tracking inferiorly. Mild bilateral gynecomastia. Skin staples in the anterior left upper chest wall at the superior aspect of fluid collection. There are no acute or suspicious osseous abnormalities. Bilateral gynecomastia. Chronic bilateral shoulder arthropathy. Prior median sternotomy. IMPRESSION: 1. Ill-defined fluid collection in the left anterior chest wall measuring approximately 13.8 x 4.6 x 10.4 cm. This contains an air-fluid level medially. This may represent a postoperative seroma, hematoma, or abscess. Sterility is indeterminate by imaging. 2. Anterior left upper lobe opacity with air bronchogram is indeterminate for atelectasis or infection. 3. Cardiomegaly. Aortic Atherosclerosis (ICD10-I70.0). Electronically Signed   By: Narda Rutherford M.D.   On: 04/29/2023 18:35   DG Chest Port 1 View  Result Date: 04/09/2023 CLINICAL DATA:  PICC line placement EXAM: PORTABLE CHEST 1 VIEW COMPARISON:  04/06/2023 FINDINGS: Single frontal view of the chest demonstrates interval placement of a left-sided PICC, tip overlying superior vena cava. 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 Stable multi lead AICD and left-sided nerve stimulator. Cardiac silhouette is unchanged. Continued central vascular congestion, with  patchy bibasilar consolidation right greater than left increased since prior study. No effusion or pneumothorax. No acute bony abnormalities. IMPRESSION: 1. Left-sided PICC as above. 2. Continued central vascular congestion, with progressive bibasilar consolidation favoring atelectasis. Electronically Signed   By: Sharlet Salina M.D.   On: 04/09/2023 17:49   [place] EKG SITE RITE  Result Date: 04/09/2023 If Site Rite image not attached, placement could not be confirmed due to current cardiac rhythm.  ECHOCARDIOGRAM LIMITED  Result Date: 04/07/2023    ECHOCARDIOGRAM LIMITED REPORT   Patient Name:   Austin [name] Date of Exam: 04/07/2023 Medical Rec #:  [id number]              Height:       73.0 in Accession #:    [id number]             Weight:       200.8 lb Date of Birth:  January 05, 1941               BSA:          2.155 m Patient Age:    82 years               BP:           102/92 mmHg Patient Gender: M                      HR:           108 bpm. 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 Cardiomyopathy and CHF, Defibrillator and                 Abnormal ECG, Arrythmias:Atrial Fibrillation, Atrial Flutter and  VT; Signs/Symptoms:Dyspnea and Shortness of Breath.                  Mitral Valve: unknown size mechanical valve valve is present in                 the mitral position.  Sonographer:    Sheralyn Boatman RDCS Referring Phys: Dorthula Nettles  Sonographer Comments: Patient has wound vac in parasternal region. IMPRESSIONS  1. Left ventricular ejection fraction, by estimation, is 20%. The left ventricle has severely decreased function. The left ventricle demonstrates global hypokinesis. No LV thrombus noted. Indeterminant diastolic function.  2. Peak RV-RA gradient 19 mmHg. IVC not visualized. Right  ventricular systolic function is mildly reduced. The right ventricular size is normal.  3. Mechanical mitral valve with mean gradient 8 mmHg, increased from prior. No mitral regurgitation noted though shadowing from prosthetic valve makes this difficult to discern.  4. Tricuspid valve regurgitation is moderate.  5. The aortic valve is tricuspid. There is moderate calcification of the aortic valve. Doppler evaluation of aortic valve not done.  6. Limited echo. FINDINGS  Left Ventricle: Left ventricular ejection fraction, by estimation, is 20%. The left ventricle has severely decreased function. The left ventricle demonstrates global hypokinesis. Left ventricular diastolic parameters are indeterminate. Right Ventricle: Peak RV-RA gradient 19 mmHg. IVC not visualized. The right ventricular size is normal. No increase in right ventricular wall thickness. Right ventricular systolic function is mildly reduced. Mitral Valve: Mechanical mitral valve with mean gradient 8 mmHg, increased from prior. No mitral regurgitation noted though shadowing from prosthetic valve makes this difficult to discern. There is a unknown size mechanical valve present in the mitral position. MV peak gradient, 10.4 mmHg. The mean mitral valve gradient is 8.0 mmHg. Tricuspid Valve: The tricuspid valve is normal in structure. Tricuspid valve regurgitation is moderate. Aortic Valve: The aortic valve is tricuspid. There is moderate calcification of the aortic valve. Venous: The inferior vena cava was not well visualized. Additional Comments: A device lead is visualized in the right ventricle. Spectral Doppler performed. Color Doppler performed.  LEFT VENTRICLE PLAX 2D LVOT diam:     2.70 cm LV SV:         55 LV SV Index:   26 LVOT Area:     5.73 cm  LV Volumes (MOD) LV vol d, MOD A2C: 177.0 ml LV vol d, MOD A4C: 155.0 ml LV vol s, MOD A2C: 148.0 ml LV vol s, MOD A4C: 119.0 ml LV SV MOD A2C:     29.0 ml LV SV MOD A4C:     155.0 ml LV SV MOD BP:       35.4 ml AORTIC VALVE LVOT Vmax:   79.60 cm/s LVOT Vmean:  47.500 cm/s LVOT VTI:    0.096 m MITRAL VALVE              TRICUSPID VALVE MV Area VTI:  2.19 cm    TR Peak grad:   19.0 mmHg MV Peak grad: 10.4 mmHg   TR Vmax:        218.00 cm/s MV Mean grad: 8.0 mmHg MV Vmax:      1.62 m/s    SHUNTS MV Vmean:     110.9 cm/s  Systemic VTI:  0.10 m                           Systemic Diam: 2.70 cm Dalton McleanMD Electronically signed by Wilfred Lacy Signature  Date/Time: 04/07/2023/2:14:27 PM    Final    DG Chest Portable 1 View  Result Date: 04/06/2023 CLINICAL DATA:  Shortness of breath. EXAM: PORTABLE CHEST 1 VIEW COMPARISON:  March 23, 2023. FINDINGS: Stable cardiomediastinal silhouette. Right-sided defibrillator is again noted. Mild central pulmonary vascular congestion is noted with possible bibasilar atelectasis or edema. Degenerative changes seen involving both shoulder joints. Status post mitral valve repair. IMPRESSION: Mild central pulmonary vascular congestion is noted with possible bibasilar atelectasis or edema. Electronically Signed   By: Lupita Raider M.D.   On: 04/06/2023 11:42    Labs:  CBC: Recent Labs    04/18/23 0306 04/21/23 0525 04/29/23 1542 04/30/23 0400  WBC 7.0 7.7 11.7* 9.0  HGB 9.3* 9.2* 9.3* 9.0*  HCT 29.6* 30.3* 29.9* 28.7*  PLT 90* 104* PLATELET CLUMPS NOTED ON SMEAR, COUNT APPEARS DECREASED 119*    COAGS: Recent Labs    03/17/23 0753 03/23/23 1223 04/22/23 0540 04/26/23 0000 04/29/23 1542 04/30/23 0400  INR 1.4*   < > 3.4* 3.00* 2.8* 3.0*  APTT 26  --   --   --   --   --    < > = values in this interval not displayed.    BMP: Recent Labs    04/21/23 0525 04/22/23 0540 04/29/23 1720 04/30/23 0400  NA 133* 132* 134* 139  K 3.5 3.4* 3.4* 2.7*  CL 93* 94* 94* 95*  CO2 31 31 30 29   GLUCOSE 129* 90 94 87  BUN 33* 32* 37* 33*  CALCIUM 8.3* 7.9* 7.8* 7.9*  CREATININE 3.05* 3.12* 2.83* 2.66*  GFRNONAA 20* 19* 22* 24*    LIVER FUNCTION  TESTS: Recent Labs    03/17/23 0753 03/24/23 0503 04/06/23 1030 04/10/23 1405 04/29/23 1720  BILITOT 1.0 0.6 1.4*  --  0.8  AST 38 25 30  --  49*  ALT 28 20 24   --  26  ALKPHOS 79 63 78  --  83  PROT 6.9 5.2* 5.9*  --  5.3*  ALBUMIN 3.7 2.9* 3.2* 3.0* 2.8*    TUMOR MARKERS: No results for input(s): "AFPTM", "CEA", "CA199", "CHROMGRNA" in the last 8760 hours.  Assessment and Plan:  Seroma of the left chest wall after removal of Barostim device.  Will proceed with image guided placement of a drain today by Dr. Lowella Dandy.  Risks and benefits discussed with the patient including bleeding, infection, damage to adjacent structures, bowel perforation/fistula connection, and sepsis.  All of the patient's questions were answered, patient is agreeable to proceed. Consent signed and in chart.   Thank you for allowing our service to participate in Austin [name] 's care.  Electronically Signed: Gwynneth Macleod, PA-C   04/30/2023, 12:11 PM      I spent a total of 20 Minutes  in face to face in clinical consultation, greater than 50% of which was counseling/coordinating care for drain placement.

## 2023-04-30 NOTE — Progress Notes (Signed)
Text paged Dr. Mickie Bail Potassium 2.7. Tim RN aware

## 2023-05-01 DIAGNOSIS — I97648 Postprocedural seroma of a circulatory system organ or structure following other circulatory system procedure: Secondary | ICD-10-CM | POA: Diagnosis not present

## 2023-05-01 DIAGNOSIS — Z952 Presence of prosthetic heart valve: Secondary | ICD-10-CM | POA: Diagnosis not present

## 2023-05-01 DIAGNOSIS — K649 Unspecified hemorrhoids: Secondary | ICD-10-CM | POA: Diagnosis present

## 2023-05-01 DIAGNOSIS — G4709 Other insomnia: Secondary | ICD-10-CM | POA: Diagnosis not present

## 2023-05-01 DIAGNOSIS — R2681 Unsteadiness on feet: Secondary | ICD-10-CM | POA: Diagnosis not present

## 2023-05-01 DIAGNOSIS — E039 Hypothyroidism, unspecified: Secondary | ICD-10-CM | POA: Diagnosis not present

## 2023-05-01 DIAGNOSIS — I428 Other cardiomyopathies: Secondary | ICD-10-CM | POA: Diagnosis present

## 2023-05-01 DIAGNOSIS — Z8249 Family history of ischemic heart disease and other diseases of the circulatory system: Secondary | ICD-10-CM | POA: Diagnosis not present

## 2023-05-01 DIAGNOSIS — I4891 Unspecified atrial fibrillation: Secondary | ICD-10-CM | POA: Diagnosis not present

## 2023-05-01 DIAGNOSIS — N184 Chronic kidney disease, stage 4 (severe): Secondary | ICD-10-CM | POA: Diagnosis not present

## 2023-05-01 DIAGNOSIS — I5022 Chronic systolic (congestive) heart failure: Secondary | ICD-10-CM | POA: Diagnosis present

## 2023-05-01 DIAGNOSIS — Z95810 Presence of automatic (implantable) cardiac defibrillator: Secondary | ICD-10-CM | POA: Diagnosis not present

## 2023-05-01 DIAGNOSIS — Y838 Other surgical procedures as the cause of abnormal reaction of the patient, or of later complication, without mention of misadventure at the time of the procedure: Secondary | ICD-10-CM | POA: Diagnosis present

## 2023-05-01 DIAGNOSIS — I472 Ventricular tachycardia, unspecified: Secondary | ICD-10-CM | POA: Diagnosis not present

## 2023-05-01 DIAGNOSIS — D631 Anemia in chronic kidney disease: Secondary | ICD-10-CM | POA: Diagnosis present

## 2023-05-01 DIAGNOSIS — L7634 Postprocedural seroma of skin and subcutaneous tissue following other procedure: Secondary | ICD-10-CM | POA: Diagnosis not present

## 2023-05-01 DIAGNOSIS — R296 Repeated falls: Secondary | ICD-10-CM | POA: Diagnosis not present

## 2023-05-01 DIAGNOSIS — Z79899 Other long term (current) drug therapy: Secondary | ICD-10-CM | POA: Diagnosis not present

## 2023-05-01 DIAGNOSIS — M96843 Postprocedural seroma of a musculoskeletal structure following other procedure: Secondary | ICD-10-CM | POA: Diagnosis present

## 2023-05-01 DIAGNOSIS — Z7901 Long term (current) use of anticoagulants: Secondary | ICD-10-CM | POA: Diagnosis not present

## 2023-05-01 DIAGNOSIS — G4733 Obstructive sleep apnea (adult) (pediatric): Secondary | ICD-10-CM | POA: Diagnosis not present

## 2023-05-01 DIAGNOSIS — Z79890 Hormone replacement therapy (postmenopausal): Secondary | ICD-10-CM | POA: Diagnosis not present

## 2023-05-01 DIAGNOSIS — H902 Conductive hearing loss, unspecified: Secondary | ICD-10-CM | POA: Diagnosis present

## 2023-05-01 DIAGNOSIS — E876 Hypokalemia: Secondary | ICD-10-CM | POA: Diagnosis not present

## 2023-05-01 DIAGNOSIS — I482 Chronic atrial fibrillation, unspecified: Secondary | ICD-10-CM | POA: Diagnosis present

## 2023-05-01 LAB — PREPARE FRESH FROZEN PLASMA: Unit division: 0

## 2023-05-01 LAB — BASIC METABOLIC PANEL
Anion gap: 15 (ref 5–15)
BUN: 29 mg/dL — ABNORMAL HIGH (ref 8–23)
CO2: 29 mmol/L (ref 22–32)
Calcium: 8.3 mg/dL — ABNORMAL LOW (ref 8.9–10.3)
Chloride: 98 mmol/L (ref 98–111)
Creatinine, Ser: 2.38 mg/dL — ABNORMAL HIGH (ref 0.61–1.24)
GFR, Estimated: 27 mL/min — ABNORMAL LOW (ref >60)
Glucose, Bld: 90 mg/dL (ref 70–99)
Potassium: 3.1 mmol/L — ABNORMAL LOW (ref 3.5–5.1)
Sodium: 142 mmol/L (ref 135–145)

## 2023-05-01 LAB — CBC
HCT: 27.8 % — ABNORMAL LOW (ref 39.0–52.0)
Hemoglobin: 8.6 g/dL — ABNORMAL LOW (ref 13.0–17.0)
MCH: 29.7 pg (ref 26.0–34.0)
MCHC: 30.9 g/dL (ref 30.0–36.0)
MCV: 95.9 fL (ref 80.0–100.0)
Platelets: 113 10*3/uL — ABNORMAL LOW (ref 150–400)
RBC: 2.9 MIL/uL — ABNORMAL LOW (ref 4.22–5.81)
RDW: 17.8 % — ABNORMAL HIGH (ref 11.5–15.5)
WBC: 8.5 10*3/uL (ref 4.0–10.5)
nRBC: 0 % (ref 0.0–0.2)

## 2023-05-01 LAB — BPAM FFP
Blood Product Expiration Date: 202410092359
ISSUE DATE / TIME: 202410050935
Unit Type and Rh: 6200

## 2023-05-01 LAB — PROTIME-INR
INR: 2.4 — ABNORMAL HIGH (ref 0.8–1.2)
Prothrombin Time: 26.1 s — ABNORMAL HIGH (ref 11.4–15.2)

## 2023-05-01 LAB — HEPARIN LEVEL (UNFRACTIONATED): Heparin Unfractionated: 0.49 [IU]/mL (ref 0.30–0.70)

## 2023-05-01 MED ORDER — HEPARIN (PORCINE) 25000 UT/250ML-% IV SOLN
1400.0000 [IU]/h | INTRAVENOUS | Status: DC
  Administered 2023-05-01: 1400 [IU]/h via INTRAVENOUS
  Filled 2023-05-01 (×2): qty 250

## 2023-05-01 MED ORDER — WARFARIN SODIUM 2.5 MG PO TABS
2.5000 mg | ORAL_TABLET | Freq: Once | ORAL | Status: AC
  Administered 2023-05-01: 2.5 mg via ORAL
  Filled 2023-05-01: qty 1

## 2023-05-01 MED ORDER — QUINIDINE SULFATE 200 MG PO TABS
100.0000 mg | ORAL_TABLET | Freq: Four times a day (QID) | ORAL | Status: DC
  Administered 2023-05-01 – 2023-05-02 (×5): 100 mg via ORAL
  Filled 2023-05-01 (×6): qty 1

## 2023-05-01 MED ORDER — POTASSIUM CHLORIDE CRYS ER 20 MEQ PO TBCR
40.0000 meq | EXTENDED_RELEASE_TABLET | Freq: Once | ORAL | Status: AC
  Administered 2023-05-01: 40 meq via ORAL
  Filled 2023-05-01: qty 2

## 2023-05-01 MED ORDER — WARFARIN - PHARMACIST DOSING INPATIENT: Freq: Every day | Status: DC

## 2023-05-01 NOTE — Progress Notes (Signed)
   05/01/23 2205  BiPAP/CPAP/SIPAP  Reason BIPAP/CPAP not in use Non-compliant  BiPAP/CPAP /SiPAP Vitals  Pulse Rate 84  SpO2 100 %  MEWS Score/Color  MEWS Score 0  MEWS Score Color [place]

## 2023-05-01 NOTE — Progress Notes (Signed)
CSW spoke with pt via phone and he explained that he want to return to the [facility] and was getting physical therapy while there. CSW noted that pt is now walking 300 ft therefore an insurance auth may be difficulty to obtain.TOC please continue to follow for disposition.

## 2023-05-01 NOTE — Progress Notes (Signed)
Mobility Specialist Progress Note    05/01/23 1139  Mobility  Activity Ambulated with assistance in hallway  Level of Assistance Contact guard assist, steadying assist  Assistive Device Four wheel walker  Distance Ambulated (ft) 300 ft  Activity Response Tolerated fair  Mobility Referral Yes  $Mobility charge 1 Mobility  Mobility Specialist Start Time (ACUTE ONLY) 1124  Mobility Specialist Stop Time (ACUTE ONLY) 1137  Mobility Specialist Time Calculation (min) (ACUTE ONLY) 13 min   Pre-Mobility: 84 HR During Mobility: 85 HR Post-Mobility: 85 HR  Pt received sitting EOB attempting to put his shorts on. C/o feeling weak across the chest and SOB with exertion. Took multiple short standing rest breaks. Returned to the chair with call bell in reach.   [place] Mobility Specialist  Please Neurosurgeon or Rehab Office at [phone]

## 2023-05-01 NOTE — Progress Notes (Signed)
PHARMACY - ANTICOAGULATION CONSULT NOTE  Pharmacy Consult for heparin bridge with resuming home warfarin Indication: atrial fibrillation and mMVR  No Known Allergies  Patient Measurements: Height: 6\' 1"  (185.4 cm) Weight: 88.9 kg (195 lb 15.8 oz) IBW/kg (Calculated) : 79.9 Heparin Dosing Weight: 88.9  Vital Signs: Temp: 97.4 F (36.3 C) (10/06 1554) Temp Source: Oral (10/06 1554) BP: 112/75 (10/06 1554) Pulse Rate: 85 (10/06 1554)  Labs: Recent Labs    04/29/23 1542 04/29/23 1720 04/30/23 0400 04/30/23 1213 05/01/23 0329 05/01/23 1754  HGB 9.3*  --  9.0*  --  8.6*  --   HCT 29.9*  --  28.7*  --  27.8*  --   PLT PLATELET CLUMPS NOTED ON SMEAR, COUNT APPEARS DECREASED  --  119*  --  113*  --   LABPROT 29.4*  --  31.5* 28.4* 26.1*  --   INR 2.8*  --  3.0* 2.6* 2.4*  --   HEPARINUNFRC  --   --   --   --   --  0.49  CREATININE  --  2.83* 2.66*  --  2.38*  --     Estimated Creatinine Clearance: 28 mL/min (A) (by C-G formula based on SCr of 2.38 mg/dL (H)).   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   Conductive hearing loss    uses hearing aids   Dysrhythmia    A-fib   History of blood transfusion 03/24/2023   Hypothyroidism    Intraspinal abscess    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   Nonischemic cardiomyopathy (HCC)    Psychosexual dysfunction with inhibited sexual excitement    S/P mitral valve replacement    Syncope and collapse    Unspecified sleep apnea    last sleep study 11/07, uses cpap   Ventricular tachycardia (HCC)    VT (ventricular tachycardia) (HCC)      Assessment: 82YOM with atrial fibrillation and  mechanical MVR on warfarin prior to admission who was admitted with left chest wall fluid collection s/p removal of Barostim device for heart failure. Anticoagulation held on admission for drain placement on 10/5 (warfarin held for 2 days). Okay to resume warfarin today and bridge with heparin gtt per IR. Home warfarin dose on admission was 2.5mg  daily.  Heparin level 0.49 is therapeutic on 1400 units/hr.   Goal of Therapy:  Heparin level 0.3-0.7 units/ml INR 3-3.5 per outpatient chart notes Monitor platelets by anticoagulation protocol: Yes   Plan:  Give warfarin 2.5mg  PO x1 Continue heparin infusion at 1400 units/hr Monitor daily INR, heparin level, CBC, signs/symptoms of bleeding  Consider enoxaparin bridge instead of heparin  Alphia Moh, PharmD, BCPS, [facility] Clinical Pharmacist  Please check AMION for all [facility] Pharmacy phone numbers After 10:00 PM, call Main Pharmacy [phone]

## 2023-05-01 NOTE — Progress Notes (Signed)
PHARMACY - ANTICOAGULATION CONSULT NOTE  Pharmacy Consult for heparin bridge with resuming home warfarin Indication: atrial fibrillation and mMVR  No Known Allergies  Patient Measurements: Height: 6\' 1"  (185.4 cm) Weight: 88.9 kg (195 lb 15.8 oz) IBW/kg (Calculated) : 79.9 Heparin Dosing Weight: 88.9  Vital Signs: Temp: 97.9 F (36.6 C) (10/06 0320) Temp Source: Oral (10/06 0320) BP: 99/62 (10/06 0320) Pulse Rate: 84 (10/06 0320)  Labs: Recent Labs    04/29/23 1542 04/29/23 1720 04/30/23 0400 04/30/23 1213 05/01/23 0329  HGB 9.3*  --  9.0*  --  8.6*  HCT 29.9*  --  28.7*  --  27.8*  PLT PLATELET CLUMPS NOTED ON SMEAR, COUNT APPEARS DECREASED  --  119*  --  113*  LABPROT 29.4*  --  31.5* 28.4* 26.1*  INR 2.8*  --  3.0* 2.6* 2.4*  CREATININE  --  2.83* 2.66*  --  2.38*    Estimated Creatinine Clearance: 28 mL/min (A) (by C-G formula based on SCr of 2.38 mg/dL (H)).   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   Conductive hearing loss    uses hearing aids   Dysrhythmia    A-fib   History of blood transfusion 03/24/2023   Hypothyroidism    Intraspinal abscess    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   Nonischemic cardiomyopathy (HCC)    Psychosexual dysfunction with inhibited sexual excitement    S/P mitral valve replacement    Syncope and collapse    Unspecified sleep apnea    last sleep study 11/07, uses cpap   Ventricular tachycardia (HCC)    VT (ventricular tachycardia) (HCC)     Medications:  Scheduled:   amiodarone  400 mg Oral BID   levothyroxine  88 mcg Oral Daily   melatonin  5 mg Oral QHS   potassium  chloride  40 mEq Oral Once   quiniDINE gluconate  324 mg Oral BID   torsemide  40 mg Oral BID   Infusions:   Assessment: 82YOM with atrial fibrillation and mechanical MVR on warfarin prior to admission who was admitted with left chest wall fluid collection s/p removal of Barostim device for heart failure. Anticoagulation held on admission for drain placement on 10/5 (warfarin held for 2 days). Okay to resume warfarin today and bridge with heparin gtt per IR. Home warfarin dose on admission was 2.5mg  daily.  Hgb trending down over the past few days, 8.6 today. Plts 113.  Goal of Therapy:  Heparin level 0.3-0.7 units/ml INR 3-3.5 per outpatient chart notes Monitor platelets by anticoagulation protocol: Yes   Plan:  Give warfarin 2.5mg  PO x1 Start heparin infusion at 1400 units/hr F/u 8hr heparin level Check heparin level daily while on heparin Continue to monitor H&H and platelets  Nicole Kindred, PharmD PGY1 Pharmacy Resident 05/01/2023 8:25 AM

## 2023-05-01 NOTE — Discharge Instructions (Signed)
Record left chest wall drain output daily.  Do not remove the drain.

## 2023-05-01 NOTE — Progress Notes (Addendum)
  Progress Note    05/01/2023 8:09 AM * No surgery found *  Subjective: Feels better this morning.  Left chest discomfort improved    Vitals:   04/30/23 2346 05/01/23 0320  BP: 91/60 99/62  Pulse: 84 84  Resp: 17 17  Temp: 97.8 F (36.6 C) 97.9 F (36.6 C)  SpO2: 99% 99%    Physical Exam: General: Laying in bed, alert and oriented x 4 Lungs: Nonlabored Incisions: Left chest incision well-appearing with staples.  Left chest wall with drain with murky red output  CBC    Component Value Date/Time   WBC 8.5 05/01/2023 0329   RBC 2.90 (L) 05/01/2023 0329   HGB 8.6 (L) 05/01/2023 0329   HGB 16.6 12/04/2020 1445   HCT 27.8 (L) 05/01/2023 0329   HCT 50.1 12/04/2020 1445   PLT 113 (L) 05/01/2023 0329   PLT 158 12/04/2020 1445   MCV 95.9 05/01/2023 0329   MCV 94 12/04/2020 1445   MCH 29.7 05/01/2023 0329   MCHC 30.9 05/01/2023 0329   RDW 17.8 (H) 05/01/2023 0329   RDW 13.7 12/04/2020 1445   LYMPHSABS 0.4 (L) 04/30/2023 0400   MONOABS 0.9 04/30/2023 0400   EOSABS 0.3 04/30/2023 0400   BASOSABS 0.1 04/30/2023 0400    BMET    Component Value Date/Time   NA 142 05/01/2023 0329   NA 139 01/29/2021 0853   K 3.1 (L) 05/01/2023 0329   CL 98 05/01/2023 0329   CO2 29 05/01/2023 0329   GLUCOSE 90 05/01/2023 0329   BUN 29 (H) 05/01/2023 0329   BUN 18 01/29/2021 0853   CREATININE 2.38 (H) 05/01/2023 0329   CREATININE 1.22 (H) 09/12/2015 0846   CALCIUM 8.3 (L) 05/01/2023 0329   GFRNONAA 27 (L) 05/01/2023 0329   GFRAA 56 (L) 04/30/2020 1144    INR    Component Value Date/Time   INR 2.4 (H) 05/01/2023 0329     Intake/Output Summary (Last 24 hours) at 05/01/2023 0809 Last data filed at 05/01/2023 9604 Gross per 24 hour  Intake 276 ml  Output 1425 ml  Net -1149 ml      Assessment/Plan:  82 y.o. male is s/p:  barostim removal on 04/12/2023   -Had a 45 French drain placed in his left chest wall yesterday by IR.  Greater than 150 cc brown fluid removed.  Total  drain output over 24 hours at 250 cc -Patient reports feeling better this morning.  Left chest discomfort improved -Okay for discharge from vascular standpoint.  Patient thinks he is going home today -He can keep follow-up with our office on the 21st to assess his drain output.  Please keep the drain in at discharge   [name] PA-C [facility] [phone] 05/01/2023 8:09 AM   VASCULAR STAFF ADDENDUM: I have independently interviewed and examined the patient. I agree with the above.  Keep drain until follow up with [name]. If he is still in house tomorrow, we will continue to follow.  Rande Brunt. Lenell Antu, MD [facility] Phone Number: [phone] 05/01/2023 10:23 AM

## 2023-05-01 NOTE — Progress Notes (Signed)
PROGRESS NOTE    Gurkirat Crofts  EXB:[id number] DOB: 1940-12-15 DOA: 04/29/2023 PCP: [name], MD   Brief Narrative:  This is a 82 year old male with past medical history of chronic atrial fibrillation on chronic anticoagulation, history of mechanical mitral valve replacement, CKD stage IIIb, hypothyroidism, HFrEF, AICD placement, bladder stimulator placement.  Patient previously had notable chest wall hematoma status post neurostimulator placement infected with Pseudomonas, stimulator was removed (9/17) patient was treated with antibiotics.  Drain was subsequently removed 10 days prior to admission without complication.  Acutely over the previous 3 days patient had worsening chest wall pain and tightness.  Hospitalist called for admission, CT surgery and IR called in consult.  Assessment & Plan:   Principal Problem:   Postprocedural seroma of skin and subcutaneous tissue following other procedure Active Problems:   Atrial fibrillation (HCC)   Hypothyroidism   H/O mitral valve replacement with mechanical valve   Chronic anticoagulation   Stage 3b chronic kidney disease (HCC)   OSA on CPAP  Postprocedural left chest wall seroma after removal of Barostim device, POA -CT surgery and IR consulted, tentative plan for drain placement 10/5 -Coumadin on hold INR 3.0, FFP completed -Drain successfully placed 10/5 - brown fluid noted -Initiate heparin with plans to bridge back to warfarin, pharmacy assistance appreciated  GI bleed ruled out -Occult negative at intake -Questionable blood noted at facility, given transient nature likely hemorrhoid related -Follow labs, transfuse if symptomatic or less than 7 -Coumadin to resume as above   Hypokalemia -2.7 at intake, now 3.1 - continue to replete/follow   Chronic atrial fibrillation //chronic anticoagulation //history of mechanical MVR -Amiodarone ongoing, continue heparin with bridge to Coumadin -INR 2.4   Stage 4  chronic kidney disease  -Stable   OSA -CPAP ordered   Hypothyroidism -Synthroid continued   HFrEF -Not in acute exacerbation, appears euvolemic, torsemide continued -Follow potassium as above may need chronic supplementation  DVT prophylaxis:    Code Status:   Code Status: Full Code  Family Communication: None present  Status is: Inpatient  Dispo: The patient is from: SNF              Anticipated d/c is to: SNF              Anticipated d/c date is: 48 to 72 hours              Patient currently not medically stable for discharge  Consultants:  CT surgery, IR  Procedures:  Drain placement 04/30/2023  Antimicrobials:  None  Subjective: No acute issues or events overnight denies nausea vomiting diarrhea constipation headache fevers chills or chest pain  Objective: Vitals:   04/30/23 2006 04/30/23 2011 04/30/23 2346 05/01/23 0320  BP: 105/67  91/60 99/62  Pulse: 85 (!) 59 84 84  Resp:   17 17  Temp: 98.4 F (36.9 C)  97.8 F (36.6 C) 97.9 F (36.6 C)  TempSrc: Oral  Oral Oral  SpO2: 99% (!) 72% 99% 99%  Weight:      Height:        Intake/Output Summary (Last 24 hours) at 05/01/2023 0737 Last data filed at 05/01/2023 0643 Gross per 24 hour  Intake 276 ml  Output 1425 ml  Net -1149 ml   Filed Weights   04/30/23 0546  Weight: 88.9 kg    Examination:  General:  Pleasantly resting in bed, No acute distress. HEENT:  Normocephalic atraumatic.  Sclerae nonicteric, noninjected.  Extraocular movements intact bilaterally.  Neck:  Without mass or deformity.  Trachea is midline. Lungs:  Clear to auscultate bilaterally without rhonchi, wheeze, or rales. Heart:  Regular rate and rhythm.  Without murmurs, rubs, or gallops. Abdomen:  Soft, nontender, nondistended.  Without guarding or rebound. Extremities: Without cyanosis, clubbing, edema, or obvious deformity. Skin:  Warm and dry, no erythema.  Data Reviewed: I have personally reviewed following labs and imaging  studies  CBC: Recent Labs  Lab 04/29/23 1542 04/30/23 0400 05/01/23 0329  WBC 11.7* 9.0 8.5  NEUTROABS 9.7* 7.3  --   HGB 9.3* 9.0* 8.6*  HCT 29.9* 28.7* 27.8*  MCV 99.0 98.6 95.9  PLT PLATELET CLUMPS NOTED ON SMEAR, COUNT APPEARS DECREASED 119* 113*   Basic Metabolic Panel: Recent Labs  Lab 04/29/23 1720 04/30/23 0400 05/01/23 0329  NA 134* 139 142  K 3.4* 2.7* 3.1*  CL 94* 95* 98  CO2 30 29 29   GLUCOSE 94 87 90  BUN 37* 33* 29*  CREATININE 2.83* 2.66* 2.38*  CALCIUM 7.8* 7.9* 8.3*  MG  --  2.1  --    GFR: Estimated Creatinine Clearance: 28 mL/min (A) (by C-G formula based on SCr of 2.38 mg/dL (H)). Liver Function Tests: Recent Labs  Lab 04/29/23 1720  AST 49*  ALT 26  ALKPHOS 83  BILITOT 0.8  PROT 5.3*  ALBUMIN 2.8*   No results for input(s): "LIPASE", "AMYLASE" in the last 168 hours. No results for input(s): "AMMONIA" in the last 168 hours. Coagulation Profile: Recent Labs  Lab 04/26/23 0000 04/29/23 1542 04/30/23 0400 04/30/23 1213 05/01/23 0329  INR 3.00* 2.8* 3.0* 2.6* 2.4*   Cardiac Enzymes: No results for input(s): "CKTOTAL", "CKMB", "CKMBINDEX", "TROPONINI" in the last 168 hours. BNP (last 3 results) Recent Labs    12/16/22 0959  PROBNP 3,692*   HbA1C: No results for input(s): "HGBA1C" in the last 72 hours. CBG: No results for input(s): "GLUCAP" in the last 168 hours. Lipid Profile: No results for input(s): "CHOL", "HDL", "LDLCALC", "TRIG", "CHOLHDL", "LDLDIRECT" in the last 72 hours. Thyroid Function Tests: No results for input(s): "TSH", "T4TOTAL", "FREET4", "T3FREE", "THYROIDAB" in the last 72 hours. Anemia Panel: No results for input(s): "VITAMINB12", "FOLATE", "FERRITIN", "TIBC", "IRON", "RETICCTPCT" in the last 72 hours. Sepsis Labs: No results for input(s): "PROCALCITON", "LATICACIDVEN" in the last 168 hours.  Recent Results (from the past 240 hour(s))  Aerobic/Anaerobic Culture w Gram Stain (surgical/deep wound)      Status: None (Preliminary result)   Collection Time: 04/30/23 12:45 PM   Specimen: Fluid  Result Value Ref Range Status   Specimen Description FLUID  Final   Special Requests NONE  Final   Gram Stain   Final    MODERATE WBC PRESENT, PREDOMINANTLY PMN NO ORGANISMS SEEN Performed at [facility], [address] [place], [place] [address]    Culture PENDING  Incomplete   Report Status PENDING  Incomplete         Radiology Studies: IR US Guide Bx Asp/Drain  Result Date: 04/30/2023 INDICATION: 82 year old with history of a Barostim device removed from the left chest. Patient had a JP drain placed in surgery for a seroma. The drain has been dislodged. Patient has a recurrent superficial fluid collection in the left anterior chest. EXAM: ULTRASOUND-GUIDED PLACEMENT OF DRAINAGE CATHETER IN LEFT ANTERIOR CHEST WALL FLUID COLLECTION MEDICATIONS: 1% lidocaine for local anesthetic ANESTHESIA/SEDATION: None COMPLICATIONS: None immediate. PROCEDURE: Informed written consent was obtained from the patient after a thorough discussion of the procedural risks, benefits  and alternatives. All questions were addressed. Maximal Sterile Barrier Technique was utilized including caps, mask, sterile gowns, sterile gloves, sterile drape, hand hygiene and skin antiseptic. A timeout was performed prior to the initiation of the procedure. Ultrasound demonstrated a large fluid collection in the left anterior chest. Left chest was prepped and draped in sterile fashion. Skin was anesthetized with 1% lidocaine along the lateral aspect of the chest. Small incision was made. Using ultrasound guidance, an 18 gauge trocar needle was directed into the fluid collection and brown fluid was aspirated. Superstiff Amplatz wire was placed. Tract was dilated to accommodate a 12 [place] multipurpose drain. Greater than 150 mL of fluid was initially removed. Drain was sutured to the skin and attached to a suction bulb. Dressing was  placed. Ultrasound images were taken and saved for this procedure. FINDINGS: Large relatively simple fluid collection in the left anterior chest wall. Drain was confirmed within the fluid collection using ultrasound guidance. Greater than 150 mL of brown fluid was removed. IMPRESSION: Ultrasound-guided placement of drainage catheter in the left anterior chest wall fluid collection. Electronically Signed   By: Richarda Overlie M.D.   On: 04/30/2023 19:18   CT Chest Wo Contrast  Result Date: 04/29/2023 CLINICAL DATA:  Postoperative swelling. Recent removal of marrow stem device. JP drain removed 4 days ago. EXAM: CT CHEST WITHOUT CONTRAST TECHNIQUE: Multidetector CT imaging of the chest was performed following the standard protocol without IV contrast. RADIATION DOSE REDUCTION: This exam was performed according to the departmental dose-optimization program which includes automated exposure control, adjustment of the mA and/or kV according to patient size and/or use of iterative reconstruction technique. COMPARISON:  Radiograph 04/09/2023. FINDINGS: Cardiovascular: The heart is enlarged. Prosthetic mitral valve. Pacemaker in place. No pericardial effusion. Aortic atherosclerosis without aneurysm. No pericardial effusion. Mediastinum/Nodes: No mediastinal adenopathy. Assessment for hilar adenopathy is limited in the absence of IV contrast. No definite enlarged axillary nodes. Left lobe of the thyroid gland is diminutive or absent. Decompressed esophagus. Lungs/Pleura: Anterior left upper lobe opacity with air bronchogram is indeterminate for atelectasis or infection. Compressive atelectasis in the right lower lobe related to elevated hemidiaphragm. No pleural effusion or features of pulmonary edema. Upper Abdomen: Fatty atrophy of the pancreas. No acute upper abdominal findings. Musculoskeletal: Ill-defined fluid collection in the left anterior chest wall. Insert measurements are difficult due to irregular shape, however  this measures approximately 13.8 x 4.6 x 10.4 cm, series 3, image 62 and series 5, image 101. This contains an air-fluid level medially. Adjacent strandy density and ill-defined fluid anteriorly and tracking inferiorly. Mild bilateral gynecomastia. Skin staples in the anterior left upper chest wall at the superior aspect of fluid collection. There are no acute or suspicious osseous abnormalities. Bilateral gynecomastia. Chronic bilateral shoulder arthropathy. Prior median sternotomy. IMPRESSION: 1. Ill-defined fluid collection in the left anterior chest wall measuring approximately 13.8 x 4.6 x 10.4 cm. This contains an air-fluid level medially. This may represent a postoperative seroma, hematoma, or abscess. Sterility is indeterminate by imaging. 2. Anterior left upper lobe opacity with air bronchogram is indeterminate for atelectasis or infection. 3. Cardiomegaly. Aortic Atherosclerosis (ICD10-I70.0). Electronically Signed   By: Narda Rutherford M.D.   On: 04/29/2023 18:35    Scheduled Meds:  amiodarone  400 mg Oral BID   levothyroxine  88 mcg Oral Daily   melatonin  5 mg Oral QHS   quiniDINE gluconate  324 mg Oral BID   torsemide  40 mg Oral BID   Continuous Infusions:  LOS: 0 days   Time spent:  Azucena Fallen, DO [facility]  If 7PM-7AM, please contact night-coverage [email or web address]  05/01/2023, 7:37 AM

## 2023-05-02 DIAGNOSIS — Z7401 Bed confinement status: Secondary | ICD-10-CM | POA: Diagnosis not present

## 2023-05-02 DIAGNOSIS — R55 Syncope and collapse: Secondary | ICD-10-CM | POA: Diagnosis not present

## 2023-05-02 DIAGNOSIS — R791 Abnormal coagulation profile: Secondary | ICD-10-CM | POA: Diagnosis not present

## 2023-05-02 DIAGNOSIS — R42 Dizziness and giddiness: Secondary | ICD-10-CM | POA: Diagnosis not present

## 2023-05-02 DIAGNOSIS — R0902 Hypoxemia: Secondary | ICD-10-CM | POA: Diagnosis not present

## 2023-05-02 DIAGNOSIS — I428 Other cardiomyopathies: Secondary | ICD-10-CM | POA: Diagnosis not present

## 2023-05-02 DIAGNOSIS — N179 Acute kidney failure, unspecified: Secondary | ICD-10-CM | POA: Diagnosis not present

## 2023-05-02 DIAGNOSIS — R5383 Other fatigue: Secondary | ICD-10-CM | POA: Diagnosis not present

## 2023-05-02 DIAGNOSIS — D638 Anemia in other chronic diseases classified elsewhere: Secondary | ICD-10-CM | POA: Diagnosis not present

## 2023-05-02 DIAGNOSIS — I4891 Unspecified atrial fibrillation: Secondary | ICD-10-CM | POA: Diagnosis not present

## 2023-05-02 DIAGNOSIS — R2681 Unsteadiness on feet: Secondary | ICD-10-CM | POA: Diagnosis not present

## 2023-05-02 DIAGNOSIS — I517 Cardiomegaly: Secondary | ICD-10-CM | POA: Diagnosis not present

## 2023-05-02 DIAGNOSIS — J984 Other disorders of lung: Secondary | ICD-10-CM | POA: Diagnosis not present

## 2023-05-02 DIAGNOSIS — S299XXA Unspecified injury of thorax, initial encounter: Secondary | ICD-10-CM | POA: Diagnosis not present

## 2023-05-02 DIAGNOSIS — Z7901 Long term (current) use of anticoagulants: Secondary | ICD-10-CM | POA: Diagnosis not present

## 2023-05-02 DIAGNOSIS — Z5181 Encounter for therapeutic drug level monitoring: Secondary | ICD-10-CM | POA: Diagnosis not present

## 2023-05-02 DIAGNOSIS — R531 Weakness: Secondary | ICD-10-CM | POA: Diagnosis not present

## 2023-05-02 DIAGNOSIS — I495 Sick sinus syndrome: Secondary | ICD-10-CM | POA: Diagnosis not present

## 2023-05-02 DIAGNOSIS — I480 Paroxysmal atrial fibrillation: Secondary | ICD-10-CM | POA: Diagnosis not present

## 2023-05-02 DIAGNOSIS — M7989 Other specified soft tissue disorders: Secondary | ICD-10-CM | POA: Diagnosis not present

## 2023-05-02 DIAGNOSIS — Z79899 Other long term (current) drug therapy: Secondary | ICD-10-CM | POA: Diagnosis not present

## 2023-05-02 DIAGNOSIS — E039 Hypothyroidism, unspecified: Secondary | ICD-10-CM | POA: Diagnosis not present

## 2023-05-02 DIAGNOSIS — I5023 Acute on chronic systolic (congestive) heart failure: Secondary | ICD-10-CM | POA: Diagnosis not present

## 2023-05-02 DIAGNOSIS — I5082 Biventricular heart failure: Secondary | ICD-10-CM | POA: Diagnosis not present

## 2023-05-02 DIAGNOSIS — I130 Hypertensive heart and chronic kidney disease with heart failure and stage 1 through stage 4 chronic kidney disease, or unspecified chronic kidney disease: Secondary | ICD-10-CM | POA: Diagnosis not present

## 2023-05-02 DIAGNOSIS — S4981XA Other specified injuries of right shoulder and upper arm, initial encounter: Secondary | ICD-10-CM | POA: Diagnosis not present

## 2023-05-02 DIAGNOSIS — N1832 Chronic kidney disease, stage 3b: Secondary | ICD-10-CM | POA: Diagnosis not present

## 2023-05-02 DIAGNOSIS — R634 Abnormal weight loss: Secondary | ICD-10-CM | POA: Diagnosis not present

## 2023-05-02 DIAGNOSIS — H902 Conductive hearing loss, unspecified: Secondary | ICD-10-CM | POA: Diagnosis not present

## 2023-05-02 DIAGNOSIS — T82838A Hemorrhage of vascular prosthetic devices, implants and grafts, initial encounter: Secondary | ICD-10-CM | POA: Diagnosis not present

## 2023-05-02 DIAGNOSIS — I509 Heart failure, unspecified: Secondary | ICD-10-CM | POA: Diagnosis not present

## 2023-05-02 DIAGNOSIS — G4733 Obstructive sleep apnea (adult) (pediatric): Secondary | ICD-10-CM | POA: Diagnosis not present

## 2023-05-02 DIAGNOSIS — I5022 Chronic systolic (congestive) heart failure: Secondary | ICD-10-CM | POA: Diagnosis not present

## 2023-05-02 DIAGNOSIS — Z79890 Hormone replacement therapy (postmenopausal): Secondary | ICD-10-CM | POA: Diagnosis not present

## 2023-05-02 DIAGNOSIS — R6889 Other general symptoms and signs: Secondary | ICD-10-CM | POA: Diagnosis not present

## 2023-05-02 DIAGNOSIS — E875 Hyperkalemia: Secondary | ICD-10-CM | POA: Diagnosis not present

## 2023-05-02 DIAGNOSIS — E876 Hypokalemia: Secondary | ICD-10-CM | POA: Diagnosis not present

## 2023-05-02 DIAGNOSIS — Z952 Presence of prosthetic heart valve: Secondary | ICD-10-CM | POA: Diagnosis not present

## 2023-05-02 DIAGNOSIS — R0989 Other specified symptoms and signs involving the circulatory and respiratory systems: Secondary | ICD-10-CM | POA: Diagnosis not present

## 2023-05-02 DIAGNOSIS — Y838 Other surgical procedures as the cause of abnormal reaction of the patient, or of later complication, without mention of misadventure at the time of the procedure: Secondary | ICD-10-CM | POA: Diagnosis present

## 2023-05-02 DIAGNOSIS — I4719 Other supraventricular tachycardia: Secondary | ICD-10-CM | POA: Diagnosis not present

## 2023-05-02 DIAGNOSIS — N184 Chronic kidney disease, stage 4 (severe): Secondary | ICD-10-CM | POA: Diagnosis not present

## 2023-05-02 DIAGNOSIS — R001 Bradycardia, unspecified: Secondary | ICD-10-CM | POA: Diagnosis not present

## 2023-05-02 DIAGNOSIS — D631 Anemia in chronic kidney disease: Secondary | ICD-10-CM | POA: Diagnosis not present

## 2023-05-02 DIAGNOSIS — E871 Hypo-osmolality and hyponatremia: Secondary | ICD-10-CM | POA: Diagnosis not present

## 2023-05-02 DIAGNOSIS — Z95810 Presence of automatic (implantable) cardiac defibrillator: Secondary | ICD-10-CM | POA: Diagnosis not present

## 2023-05-02 DIAGNOSIS — J9809 Other diseases of bronchus, not elsewhere classified: Secondary | ICD-10-CM | POA: Diagnosis not present

## 2023-05-02 DIAGNOSIS — D689 Coagulation defect, unspecified: Secondary | ICD-10-CM | POA: Diagnosis not present

## 2023-05-02 DIAGNOSIS — I482 Chronic atrial fibrillation, unspecified: Secondary | ICD-10-CM | POA: Diagnosis not present

## 2023-05-02 DIAGNOSIS — Z8249 Family history of ischemic heart disease and other diseases of the circulatory system: Secondary | ICD-10-CM | POA: Diagnosis not present

## 2023-05-02 DIAGNOSIS — Z743 Need for continuous supervision: Secondary | ICD-10-CM | POA: Diagnosis not present

## 2023-05-02 DIAGNOSIS — R443 Hallucinations, unspecified: Secondary | ICD-10-CM | POA: Diagnosis not present

## 2023-05-02 DIAGNOSIS — I672 Cerebral atherosclerosis: Secondary | ICD-10-CM | POA: Diagnosis not present

## 2023-05-02 DIAGNOSIS — I442 Atrioventricular block, complete: Secondary | ICD-10-CM | POA: Diagnosis not present

## 2023-05-02 DIAGNOSIS — N189 Chronic kidney disease, unspecified: Secondary | ICD-10-CM | POA: Diagnosis not present

## 2023-05-02 DIAGNOSIS — I499 Cardiac arrhythmia, unspecified: Secondary | ICD-10-CM | POA: Diagnosis not present

## 2023-05-02 DIAGNOSIS — W1839XA Other fall on same level, initial encounter: Secondary | ICD-10-CM | POA: Diagnosis not present

## 2023-05-02 DIAGNOSIS — R918 Other nonspecific abnormal finding of lung field: Secondary | ICD-10-CM | POA: Diagnosis not present

## 2023-05-02 DIAGNOSIS — R627 Adult failure to thrive: Secondary | ICD-10-CM | POA: Diagnosis not present

## 2023-05-02 DIAGNOSIS — R404 Transient alteration of awareness: Secondary | ICD-10-CM | POA: Diagnosis not present

## 2023-05-02 DIAGNOSIS — Z950 Presence of cardiac pacemaker: Secondary | ICD-10-CM | POA: Diagnosis not present

## 2023-05-02 DIAGNOSIS — R231 Pallor: Secondary | ICD-10-CM | POA: Diagnosis not present

## 2023-05-02 DIAGNOSIS — I10 Essential (primary) hypertension: Secondary | ICD-10-CM | POA: Diagnosis not present

## 2023-05-02 DIAGNOSIS — L7634 Postprocedural seroma of skin and subcutaneous tissue following other procedure: Secondary | ICD-10-CM | POA: Diagnosis not present

## 2023-05-02 DIAGNOSIS — I472 Ventricular tachycardia, unspecified: Secondary | ICD-10-CM | POA: Diagnosis not present

## 2023-05-02 DIAGNOSIS — R58 Hemorrhage, not elsewhere classified: Secondary | ICD-10-CM | POA: Diagnosis not present

## 2023-05-02 DIAGNOSIS — W19XXXA Unspecified fall, initial encounter: Secondary | ICD-10-CM | POA: Diagnosis not present

## 2023-05-02 DIAGNOSIS — T85838A Hemorrhage due to other internal prosthetic devices, implants and grafts, initial encounter: Secondary | ICD-10-CM | POA: Diagnosis not present

## 2023-05-02 DIAGNOSIS — E8809 Other disorders of plasma-protein metabolism, not elsewhere classified: Secondary | ICD-10-CM | POA: Diagnosis not present

## 2023-05-02 DIAGNOSIS — J9811 Atelectasis: Secondary | ICD-10-CM | POA: Diagnosis not present

## 2023-05-02 DIAGNOSIS — I110 Hypertensive heart disease with heart failure: Secondary | ICD-10-CM | POA: Diagnosis not present

## 2023-05-02 DIAGNOSIS — I6782 Cerebral ischemia: Secondary | ICD-10-CM | POA: Diagnosis not present

## 2023-05-02 DIAGNOSIS — G4709 Other insomnia: Secondary | ICD-10-CM | POA: Diagnosis not present

## 2023-05-02 DIAGNOSIS — I97648 Postprocedural seroma of a circulatory system organ or structure following other circulatory system procedure: Secondary | ICD-10-CM | POA: Diagnosis not present

## 2023-05-02 DIAGNOSIS — I4892 Unspecified atrial flutter: Secondary | ICD-10-CM | POA: Diagnosis not present

## 2023-05-02 DIAGNOSIS — S20212S Contusion of left front wall of thorax, sequela: Secondary | ICD-10-CM | POA: Diagnosis not present

## 2023-05-02 DIAGNOSIS — Z471 Aftercare following joint replacement surgery: Secondary | ICD-10-CM | POA: Diagnosis not present

## 2023-05-02 DIAGNOSIS — R296 Repeated falls: Secondary | ICD-10-CM | POA: Diagnosis not present

## 2023-05-02 DIAGNOSIS — S0990XA Unspecified injury of head, initial encounter: Secondary | ICD-10-CM | POA: Diagnosis not present

## 2023-05-02 LAB — CBC
HCT: 28 % — ABNORMAL LOW (ref 39.0–52.0)
Hemoglobin: 8.8 g/dL — ABNORMAL LOW (ref 13.0–17.0)
MCH: 31.4 pg (ref 26.0–34.0)
MCHC: 31.4 g/dL (ref 30.0–36.0)
MCV: 100 fL (ref 80.0–100.0)
Platelets: 121 10*3/uL — ABNORMAL LOW (ref 150–400)
RBC: 2.8 MIL/uL — ABNORMAL LOW (ref 4.22–5.81)
RDW: 17.9 % — ABNORMAL HIGH (ref 11.5–15.5)
WBC: 7.7 10*3/uL (ref 4.0–10.5)
nRBC: 0 % (ref 0.0–0.2)

## 2023-05-02 LAB — BASIC METABOLIC PANEL
Anion gap: 8 (ref 5–15)
BUN: 27 mg/dL — ABNORMAL HIGH (ref 8–23)
CO2: 32 mmol/L (ref 22–32)
Calcium: 8.2 mg/dL — ABNORMAL LOW (ref 8.9–10.3)
Chloride: 100 mmol/L (ref 98–111)
Creatinine, Ser: 2.24 mg/dL — ABNORMAL HIGH (ref 0.61–1.24)
GFR, Estimated: 29 mL/min — ABNORMAL LOW (ref >60)
Glucose, Bld: 98 mg/dL (ref 70–99)
Potassium: 3.3 mmol/L — ABNORMAL LOW (ref 3.5–5.1)
Sodium: 140 mmol/L (ref 135–145)

## 2023-05-02 LAB — PROTIME-INR
INR: 1.8 — ABNORMAL HIGH (ref 0.8–1.2)
Prothrombin Time: 21.5 s — ABNORMAL HIGH (ref 11.4–15.2)

## 2023-05-02 LAB — HEPARIN LEVEL (UNFRACTIONATED): Heparin Unfractionated: 0.54 [IU]/mL (ref 0.30–0.70)

## 2023-05-02 MED ORDER — ENOXAPARIN SODIUM 80 MG/0.8ML IJ SOSY: 80.0000 mg | PREFILLED_SYRINGE | Freq: Two times a day (BID) | INTRAMUSCULAR | 0 refills | Status: DC

## 2023-05-02 NOTE — TOC Progression Note (Signed)
Transition of Care [facility]) - Progression Note    Patient Details  Name: Austin [name] MRN: [id number] Date of Birth: 12-15-1940  Transition of Care [facility]) CM/SW Contact  Eduard Roux, [place] Phone Number: 05/02/2023, 10:00 AM  Clinical Narrative:     Rec'd call from [facility]- SNF needs authorization for patient to return to SNF.   CSW started auth reference # [id number]- auth pending.  Antony Blackbird, MSW, LCSW Clinical Social Worker    Expected Discharge Plan: Skilled Nursing Facility Barriers to Discharge: Insurance Authorization  Expected Discharge Plan and Services         Expected Discharge Date: 05/02/23                                     Social Determinants of Health (SDOH) Interventions SDOH Screenings   Food Insecurity: No Food Insecurity (04/29/2023)  Housing: Low Risk  (04/29/2023)  Transportation Needs: No Transportation Needs (04/29/2023)  Utilities: Not At Risk (04/29/2023)  Alcohol Screen: Low Risk  (03/08/2023)  Depression (PHQ2-9): Low Risk  (03/08/2023)  Financial Resource Strain: Low Risk  (03/08/2023)  Physical Activity: Insufficiently Active (03/08/2023)  Social Connections: Moderately Integrated (03/08/2023)  Stress: No Stress Concern Present (03/08/2023)  Tobacco Use: Low Risk  (04/29/2023)  Health Literacy: Adequate Health Literacy (03/08/2023)    Readmission Risk Interventions     No data to display

## 2023-05-02 NOTE — Progress Notes (Signed)
Occupational Therapy Treatment Patient Details Name: Austin [name] MRN: [id number] DOB: 04/25/1941 Today's Date: 05/02/2023   History of present illness Pt is an 82 y.o. male who had a prior admission on 04/06/23 with ICD shocks. Of note, pt underwent Barostim device 03/17/23. S/p removal of L sided Barostim device and redo L carotid artery exposure 9/17. However, then returned to ED from SNF as he developed fluid in L chest wall PMH: Mechanical MV s/p MV endocarditis, VT, NICM, CHB and PAF/AFL, CKD, CHF, syncope, sleep apnea   OT comments  Pt progressing toward established OT goals. Pt with active bleed at site of JP drain on arrival and drain observed to be between legs while sitting in chair; RN entered room for management. Pt needing up to min A for standing balance during LB ADL and min cues for safety. Pt impulsive throughout mobility despite RN having just changed dressing and asking for staff to assist with lines and drains. Patient will benefit from continued inpatient follow up therapy, <3 hours/day       If plan is discharge home, recommend the following:  A little help with bathing/dressing/bathroom;A little help with walking and/or transfers;Assist for transportation;Assistance with cooking/housework   Equipment Recommendations  None recommended by OT    Recommendations for Other Services      Precautions / Restrictions Precautions Precautions: Fall Precaution Comments: JP drain L chest Restrictions Weight Bearing Restrictions: No       Mobility Bed Mobility               General bed mobility comments: up in chair    Transfers Overall transfer level: Needs assistance Equipment used: Rolling walker (2 wheels) Transfers: Sit to/from Stand Sit to Stand: Contact guard assist           General transfer comment: pt impulsive with standing prior to OT managing drain and standing up from EOB with OT at sink not next to pt     Balance Overall balance  assessment: Needs assistance Sitting-balance support: Feet supported Sitting balance-Leahy Scale: Good Sitting balance - Comments: EOB to don shirt and pants with NT assist   Standing balance support: Bilateral upper extremity supported, No upper extremity supported, During functional activity Standing balance-Leahy Scale: Fair                             ADL either performed or assessed with clinical judgement   ADL Overall ADL's : Needs assistance/impaired                     Lower Body Dressing: Contact guard assist;Minimal assistance;Sit to/from stand;Cueing for safety Lower Body Dressing Details (indicate cue type and reason): do don pants/underpants, and shoes Toilet Transfer: Contact guard assist           Functional mobility during ADLs: Contact guard assist;Rolling walker (2 wheels) General ADL Comments: poor safety awareness with RW    Extremity/Trunk Assessment Upper Extremity Assessment Upper Extremity Assessment: Overall WFL for tasks assessed   Lower Extremity Assessment Lower Extremity Assessment: Generalized weakness        Vision       Perception     Praxis      Cognition Arousal: Alert Behavior During Therapy: WFL for tasks assessed/performed Overall Cognitive Status: No family/caregiver present to determine baseline cognitive functioning  General Comments: Pt with poor safety awareness and with impulsive mobility with no regard to location of lines/drains. Pt with active bleeding at site of JP drain on arrival and noted to be between his legs as well with pt with no awareness. Pt needing mod cues for safety with RW and mobility.        Exercises      Shoulder Instructions       General Comments      Pertinent Vitals/ Pain       Pain Assessment Pain Assessment: No/denies pain Pain Intervention(s): Monitored during session  Home Living                                           Prior Functioning/Environment              Frequency  Min 1X/week        Progress Toward Goals  OT Goals(current goals can now be found in the care plan section)  Progress towards OT goals: Progressing toward goals  Acute Rehab OT Goals Patient Stated Goal: go to rehab at [facility] OT Goal Formulation: With patient Time For Goal Achievement: 05/14/23 Potential to Achieve Goals: Good ADL Goals Pt Will Perform Upper Body Bathing: with modified independence;sitting Pt Will Perform Lower Body Bathing: with modified independence;sit to/from stand Pt Will Transfer to Toilet: with modified independence;ambulating  Plan      Co-evaluation                 AM-PAC OT "6 Clicks" Daily Activity     Outcome Measure   Help from another person eating meals?: None Help from another person taking care of personal grooming?: None Help from another person toileting, which includes using toliet, bedpan, or urinal?: A Little Help from another person bathing (including washing, rinsing, drying)?: A Little Help from another person to put on and taking off regular upper body clothing?: None Help from another person to put on and taking off regular lower body clothing?: A Little 6 Click Score: 21    End of Session Equipment Utilized During Treatment: Rolling walker (2 wheels)  OT Visit Diagnosis: Unsteadiness on feet (R26.81);Muscle weakness (generalized) (M62.81)   Activity Tolerance Patient tolerated treatment well   Patient Left in chair;with call bell/phone within reach   Nurse Communication Mobility status        Time: 8413-2440 OT Time Calculation (min): 33 min  Charges: OT General Charges $OT Visit: 1 Visit OT Treatments $Self Care/Home Management : 23-37 mins  Tyler Deis, OTR/L [facility] Acute Rehabilitation Office: [phone]   Myrla Halsted 05/02/2023, 11:29 AM

## 2023-05-02 NOTE — Discharge Summary (Signed)
Physician Discharge Summary  Austin [name]:[id number] DOB: 1940-09-05 DOA: 04/29/2023  PCP: Austin Grandchild, MD  Admit date: 04/29/2023 Discharge date: 05/02/2023  Admitted From: SNF Disposition:  SNF  Recommendations for Outpatient Follow-up:  Follow up with PCP in 1-2 weeks Check INR daily - stop lovenox once therapeutic  Discharge Condition:Stable  CODE STATUS:Full  Diet recommendation: As tolerated    Brief/Interim Summary: This is a 82 year old male with past medical history of chronic atrial fibrillation on chronic anticoagulation, history of mechanical mitral valve replacement, CKD stage IIIb, hypothyroidism, HFrEF, AICD placement, bladder stimulator placement.  Patient previously had notable chest wall hematoma status post neurostimulator placement infected with Pseudomonas, stimulator was removed (9/17) patient was treated with antibiotics.  Drain was subsequently removed 10 days prior to admission without complication.  Acutely over the previous 3 days patient had worsening chest wall pain and tightness.  Hospitalist called for admission, CT surgery and IR called in consult.    Discharge Diagnoses:  Principal Problem:   Postprocedural seroma of skin and subcutaneous tissue following other procedure Active Problems:   Atrial fibrillation (HCC)   Hypothyroidism   H/O mitral valve replacement with mechanical valve   Chronic anticoagulation   Stage 3b chronic kidney disease (HCC)   OSA on CPAP   Postoperative seroma involving circulatory system after other circulatory system procedure   Postprocedural left chest wall seroma after removal of Barostim device, POA -CT surgery and IR consulted, successful drain placement 10/5 with 150cc of brown non-bloody fluid - ok to resume anticoagulation. -Coumadin previously on hold - now restarted w/ lovenox bridge -INR 1.8   Chronic anemia of chronic disease (CKD4) GI bleed ruled out -Occult negative at  intake -Questionable blood noted at facility, given transient nature likely hemorrhoid/similr -Follow labs, transfuse if symptomatic or less than 7 -Coumadin to resume as above   Hypokalemia -2.7 at intake, now 3.1 - continue to replete/follow   Chronic atrial fibrillation //chronic anticoagulation //history of mechanical MVR -Amiodarone ongoing, continue lovenox with bridge to Coumadin -INR 1.8 today   Stage 4 chronic kidney disease  -Stable   OSA -CPAP ordered   Hypothyroidism -Synthroid continued   HFrEF -Not in acute exacerbation, appears euvolemic, torsemide continued -Follow potassium as above may need chronic supplementation  Discharge Instructions  Discharge Instructions     Discharge patient   Complete by: As directed    Discharge disposition: 03-Skilled Nursing Facility   Discharge patient date: 05/02/2023      Allergies as of 05/02/2023   No Known Allergies      Medication List     STOP taking these medications    ciprofloxacin 500 MG tablet Commonly known as: CIPRO       TAKE these medications    acetaminophen 500 MG tablet Commonly known as: TYLENOL Take 1,000 mg by mouth every 8 (eight) hours as needed for headache, fever or mild pain.   amiodarone 200 MG tablet Commonly known as: PACERONE Take amio 400 mg twice a day   enoxaparin 80 MG/0.8ML injection Commonly known as: LOVENOX Inject 0.8 mLs (80 mg total) into the skin 2 (two) times daily. Discontinue once INR is therapeutic   levothyroxine 88 MCG tablet Commonly known as: SYNTHROID TAKE 1 TABLET BY MOUTH EVERY DAY   melatonin 5 MG Tabs Take 5 mg by mouth at bedtime.   mirtazapine 7.5 MG tablet Commonly known as: REMERON Take 1 tablet (7.5 mg total) by mouth at bedtime.   multivitamin with minerals Tabs  Physician Discharge Summary  Austin [name]:[id number] DOB: 1940-09-05 DOA: 04/29/2023  PCP: Austin Grandchild, MD  Admit date: 04/29/2023 Discharge date: 05/02/2023  Admitted From: SNF Disposition:  SNF  Recommendations for Outpatient Follow-up:  Follow up with PCP in 1-2 weeks Check INR daily - stop lovenox once therapeutic  Discharge Condition:Stable  CODE STATUS:Full  Diet recommendation: As tolerated    Brief/Interim Summary: This is a 82 year old male with past medical history of chronic atrial fibrillation on chronic anticoagulation, history of mechanical mitral valve replacement, CKD stage IIIb, hypothyroidism, HFrEF, AICD placement, bladder stimulator placement.  Patient previously had notable chest wall hematoma status post neurostimulator placement infected with Pseudomonas, stimulator was removed (9/17) patient was treated with antibiotics.  Drain was subsequently removed 10 days prior to admission without complication.  Acutely over the previous 3 days patient had worsening chest wall pain and tightness.  Hospitalist called for admission, CT surgery and IR called in consult.    Discharge Diagnoses:  Principal Problem:   Postprocedural seroma of skin and subcutaneous tissue following other procedure Active Problems:   Atrial fibrillation (HCC)   Hypothyroidism   H/O mitral valve replacement with mechanical valve   Chronic anticoagulation   Stage 3b chronic kidney disease (HCC)   OSA on CPAP   Postoperative seroma involving circulatory system after other circulatory system procedure   Postprocedural left chest wall seroma after removal of Barostim device, POA -CT surgery and IR consulted, successful drain placement 10/5 with 150cc of brown non-bloody fluid - ok to resume anticoagulation. -Coumadin previously on hold - now restarted w/ lovenox bridge -INR 1.8   Chronic anemia of chronic disease (CKD4) GI bleed ruled out -Occult negative at  intake -Questionable blood noted at facility, given transient nature likely hemorrhoid/similr -Follow labs, transfuse if symptomatic or less than 7 -Coumadin to resume as above   Hypokalemia -2.7 at intake, now 3.1 - continue to replete/follow   Chronic atrial fibrillation //chronic anticoagulation //history of mechanical MVR -Amiodarone ongoing, continue lovenox with bridge to Coumadin -INR 1.8 today   Stage 4 chronic kidney disease  -Stable   OSA -CPAP ordered   Hypothyroidism -Synthroid continued   HFrEF -Not in acute exacerbation, appears euvolemic, torsemide continued -Follow potassium as above may need chronic supplementation  Discharge Instructions  Discharge Instructions     Discharge patient   Complete by: As directed    Discharge disposition: 03-Skilled Nursing Facility   Discharge patient date: 05/02/2023      Allergies as of 05/02/2023   No Known Allergies      Medication List     STOP taking these medications    ciprofloxacin 500 MG tablet Commonly known as: CIPRO       TAKE these medications    acetaminophen 500 MG tablet Commonly known as: TYLENOL Take 1,000 mg by mouth every 8 (eight) hours as needed for headache, fever or mild pain.   amiodarone 200 MG tablet Commonly known as: PACERONE Take amio 400 mg twice a day   enoxaparin 80 MG/0.8ML injection Commonly known as: LOVENOX Inject 0.8 mLs (80 mg total) into the skin 2 (two) times daily. Discontinue once INR is therapeutic   levothyroxine 88 MCG tablet Commonly known as: SYNTHROID TAKE 1 TABLET BY MOUTH EVERY DAY   melatonin 5 MG Tabs Take 5 mg by mouth at bedtime.   mirtazapine 7.5 MG tablet Commonly known as: REMERON Take 1 tablet (7.5 mg total) by mouth at bedtime.   multivitamin with minerals Tabs  Physician Discharge Summary  Austin [name]:[id number] DOB: 1940-09-05 DOA: 04/29/2023  PCP: Austin Grandchild, MD  Admit date: 04/29/2023 Discharge date: 05/02/2023  Admitted From: SNF Disposition:  SNF  Recommendations for Outpatient Follow-up:  Follow up with PCP in 1-2 weeks Check INR daily - stop lovenox once therapeutic  Discharge Condition:Stable  CODE STATUS:Full  Diet recommendation: As tolerated    Brief/Interim Summary: This is a 82 year old male with past medical history of chronic atrial fibrillation on chronic anticoagulation, history of mechanical mitral valve replacement, CKD stage IIIb, hypothyroidism, HFrEF, AICD placement, bladder stimulator placement.  Patient previously had notable chest wall hematoma status post neurostimulator placement infected with Pseudomonas, stimulator was removed (9/17) patient was treated with antibiotics.  Drain was subsequently removed 10 days prior to admission without complication.  Acutely over the previous 3 days patient had worsening chest wall pain and tightness.  Hospitalist called for admission, CT surgery and IR called in consult.    Discharge Diagnoses:  Principal Problem:   Postprocedural seroma of skin and subcutaneous tissue following other procedure Active Problems:   Atrial fibrillation (HCC)   Hypothyroidism   H/O mitral valve replacement with mechanical valve   Chronic anticoagulation   Stage 3b chronic kidney disease (HCC)   OSA on CPAP   Postoperative seroma involving circulatory system after other circulatory system procedure   Postprocedural left chest wall seroma after removal of Barostim device, POA -CT surgery and IR consulted, successful drain placement 10/5 with 150cc of brown non-bloody fluid - ok to resume anticoagulation. -Coumadin previously on hold - now restarted w/ lovenox bridge -INR 1.8   Chronic anemia of chronic disease (CKD4) GI bleed ruled out -Occult negative at  intake -Questionable blood noted at facility, given transient nature likely hemorrhoid/similr -Follow labs, transfuse if symptomatic or less than 7 -Coumadin to resume as above   Hypokalemia -2.7 at intake, now 3.1 - continue to replete/follow   Chronic atrial fibrillation //chronic anticoagulation //history of mechanical MVR -Amiodarone ongoing, continue lovenox with bridge to Coumadin -INR 1.8 today   Stage 4 chronic kidney disease  -Stable   OSA -CPAP ordered   Hypothyroidism -Synthroid continued   HFrEF -Not in acute exacerbation, appears euvolemic, torsemide continued -Follow potassium as above may need chronic supplementation  Discharge Instructions  Discharge Instructions     Discharge patient   Complete by: As directed    Discharge disposition: 03-Skilled Nursing Facility   Discharge patient date: 05/02/2023      Allergies as of 05/02/2023   No Known Allergies      Medication List     STOP taking these medications    ciprofloxacin 500 MG tablet Commonly known as: CIPRO       TAKE these medications    acetaminophen 500 MG tablet Commonly known as: TYLENOL Take 1,000 mg by mouth every 8 (eight) hours as needed for headache, fever or mild pain.   amiodarone 200 MG tablet Commonly known as: PACERONE Take amio 400 mg twice a day   enoxaparin 80 MG/0.8ML injection Commonly known as: LOVENOX Inject 0.8 mLs (80 mg total) into the skin 2 (two) times daily. Discontinue once INR is therapeutic   levothyroxine 88 MCG tablet Commonly known as: SYNTHROID TAKE 1 TABLET BY MOUTH EVERY DAY   melatonin 5 MG Tabs Take 5 mg by mouth at bedtime.   mirtazapine 7.5 MG tablet Commonly known as: REMERON Take 1 tablet (7.5 mg total) by mouth at bedtime.   multivitamin with minerals Tabs  Physician Discharge Summary  Austin [name]:[id number] DOB: 1940-09-05 DOA: 04/29/2023  PCP: Austin Grandchild, MD  Admit date: 04/29/2023 Discharge date: 05/02/2023  Admitted From: SNF Disposition:  SNF  Recommendations for Outpatient Follow-up:  Follow up with PCP in 1-2 weeks Check INR daily - stop lovenox once therapeutic  Discharge Condition:Stable  CODE STATUS:Full  Diet recommendation: As tolerated    Brief/Interim Summary: This is a 82 year old male with past medical history of chronic atrial fibrillation on chronic anticoagulation, history of mechanical mitral valve replacement, CKD stage IIIb, hypothyroidism, HFrEF, AICD placement, bladder stimulator placement.  Patient previously had notable chest wall hematoma status post neurostimulator placement infected with Pseudomonas, stimulator was removed (9/17) patient was treated with antibiotics.  Drain was subsequently removed 10 days prior to admission without complication.  Acutely over the previous 3 days patient had worsening chest wall pain and tightness.  Hospitalist called for admission, CT surgery and IR called in consult.    Discharge Diagnoses:  Principal Problem:   Postprocedural seroma of skin and subcutaneous tissue following other procedure Active Problems:   Atrial fibrillation (HCC)   Hypothyroidism   H/O mitral valve replacement with mechanical valve   Chronic anticoagulation   Stage 3b chronic kidney disease (HCC)   OSA on CPAP   Postoperative seroma involving circulatory system after other circulatory system procedure   Postprocedural left chest wall seroma after removal of Barostim device, POA -CT surgery and IR consulted, successful drain placement 10/5 with 150cc of brown non-bloody fluid - ok to resume anticoagulation. -Coumadin previously on hold - now restarted w/ lovenox bridge -INR 1.8   Chronic anemia of chronic disease (CKD4) GI bleed ruled out -Occult negative at  intake -Questionable blood noted at facility, given transient nature likely hemorrhoid/similr -Follow labs, transfuse if symptomatic or less than 7 -Coumadin to resume as above   Hypokalemia -2.7 at intake, now 3.1 - continue to replete/follow   Chronic atrial fibrillation //chronic anticoagulation //history of mechanical MVR -Amiodarone ongoing, continue lovenox with bridge to Coumadin -INR 1.8 today   Stage 4 chronic kidney disease  -Stable   OSA -CPAP ordered   Hypothyroidism -Synthroid continued   HFrEF -Not in acute exacerbation, appears euvolemic, torsemide continued -Follow potassium as above may need chronic supplementation  Discharge Instructions  Discharge Instructions     Discharge patient   Complete by: As directed    Discharge disposition: 03-Skilled Nursing Facility   Discharge patient date: 05/02/2023      Allergies as of 05/02/2023   No Known Allergies      Medication List     STOP taking these medications    ciprofloxacin 500 MG tablet Commonly known as: CIPRO       TAKE these medications    acetaminophen 500 MG tablet Commonly known as: TYLENOL Take 1,000 mg by mouth every 8 (eight) hours as needed for headache, fever or mild pain.   amiodarone 200 MG tablet Commonly known as: PACERONE Take amio 400 mg twice a day   enoxaparin 80 MG/0.8ML injection Commonly known as: LOVENOX Inject 0.8 mLs (80 mg total) into the skin 2 (two) times daily. Discontinue once INR is therapeutic   levothyroxine 88 MCG tablet Commonly known as: SYNTHROID TAKE 1 TABLET BY MOUTH EVERY DAY   melatonin 5 MG Tabs Take 5 mg by mouth at bedtime.   mirtazapine 7.5 MG tablet Commonly known as: REMERON Take 1 tablet (7.5 mg total) by mouth at bedtime.   multivitamin with minerals Tabs  in place. No pericardial effusion. Aortic atherosclerosis without aneurysm. No pericardial effusion. Mediastinum/Nodes: No mediastinal adenopathy. Assessment for hilar adenopathy is limited in the absence of IV contrast. No definite enlarged axillary nodes. Left lobe of the thyroid gland is diminutive or absent.  Decompressed esophagus. Lungs/Pleura: Anterior left upper lobe opacity with air bronchogram is indeterminate for atelectasis or infection. Compressive atelectasis in the right lower lobe related to elevated hemidiaphragm. No pleural effusion or features of pulmonary edema. Upper Abdomen: Fatty atrophy of the pancreas. No acute upper abdominal findings. Musculoskeletal: Ill-defined fluid collection in the left anterior chest wall. Insert measurements are difficult due to irregular shape, however this measures approximately 13.8 x 4.6 x 10.4 cm, series 3, image 62 and series 5, image 101. This contains an air-fluid level medially. Adjacent strandy density and ill-defined fluid anteriorly and tracking inferiorly. Mild bilateral gynecomastia. Skin staples in the anterior left upper chest wall at the superior aspect of fluid collection. There are no acute or suspicious osseous abnormalities. Bilateral gynecomastia. Chronic bilateral shoulder arthropathy. Prior median sternotomy. IMPRESSION: 1. Ill-defined fluid collection in the left anterior chest wall measuring approximately 13.8 x 4.6 x 10.4 cm. This contains an air-fluid level medially. This may represent a postoperative seroma, hematoma, or abscess. Sterility is indeterminate by imaging. 2. Anterior left upper lobe opacity with air bronchogram is indeterminate for atelectasis or infection. 3. Cardiomegaly. Aortic Atherosclerosis (ICD10-I70.0). Electronically Signed   By: Narda Rutherford M.D.   On: 04/29/2023 18:35   DG Chest Port 1 View  Result Date: 04/09/2023 CLINICAL DATA:  PICC line placement EXAM: PORTABLE CHEST 1 VIEW COMPARISON:  04/06/2023 FINDINGS: Single frontal view of the chest demonstrates interval placement of a left-sided PICC, tip overlying superior vena cava. 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 Stable multi lead AICD and left-sided nerve stimulator. Cardiac silhouette is unchanged. Continued central vascular  congestion, with patchy bibasilar consolidation right greater than left increased since prior study. No effusion or pneumothorax. No acute bony abnormalities. IMPRESSION: 1. Left-sided PICC as above. 2. Continued central vascular congestion, with progressive bibasilar consolidation favoring atelectasis. Electronically Signed   By: Sharlet Salina M.D.   On: 04/09/2023 17:49   [place] EKG SITE RITE  Result Date: 04/09/2023 If Site Rite image not attached, placement could not be confirmed due to current cardiac rhythm.  ECHOCARDIOGRAM LIMITED  Result Date: 04/07/2023    ECHOCARDIOGRAM LIMITED REPORT   Patient Name:   Austin [name] Date of Exam: 04/07/2023 Medical Rec #:  [id number]              Height:       73.0 in Accession #:    [id number]             Weight:       200.8 lb Date of Birth:  [birth year]               BSA:          2.155 m Patient Age:    82 years               BP:           102/92 mmHg Patient Gender: M                      HR:           108 bpm. Exam Location:  Inpatient Procedure: Limited Echo, Cardiac Doppler and Color Doppler Indications:    I25.5  in place. No pericardial effusion. Aortic atherosclerosis without aneurysm. No pericardial effusion. Mediastinum/Nodes: No mediastinal adenopathy. Assessment for hilar adenopathy is limited in the absence of IV contrast. No definite enlarged axillary nodes. Left lobe of the thyroid gland is diminutive or absent.  Decompressed esophagus. Lungs/Pleura: Anterior left upper lobe opacity with air bronchogram is indeterminate for atelectasis or infection. Compressive atelectasis in the right lower lobe related to elevated hemidiaphragm. No pleural effusion or features of pulmonary edema. Upper Abdomen: Fatty atrophy of the pancreas. No acute upper abdominal findings. Musculoskeletal: Ill-defined fluid collection in the left anterior chest wall. Insert measurements are difficult due to irregular shape, however this measures approximately 13.8 x 4.6 x 10.4 cm, series 3, image 62 and series 5, image 101. This contains an air-fluid level medially. Adjacent strandy density and ill-defined fluid anteriorly and tracking inferiorly. Mild bilateral gynecomastia. Skin staples in the anterior left upper chest wall at the superior aspect of fluid collection. There are no acute or suspicious osseous abnormalities. Bilateral gynecomastia. Chronic bilateral shoulder arthropathy. Prior median sternotomy. IMPRESSION: 1. Ill-defined fluid collection in the left anterior chest wall measuring approximately 13.8 x 4.6 x 10.4 cm. This contains an air-fluid level medially. This may represent a postoperative seroma, hematoma, or abscess. Sterility is indeterminate by imaging. 2. Anterior left upper lobe opacity with air bronchogram is indeterminate for atelectasis or infection. 3. Cardiomegaly. Aortic Atherosclerosis (ICD10-I70.0). Electronically Signed   By: Narda Rutherford M.D.   On: 04/29/2023 18:35   DG Chest Port 1 View  Result Date: 04/09/2023 CLINICAL DATA:  PICC line placement EXAM: PORTABLE CHEST 1 VIEW COMPARISON:  04/06/2023 FINDINGS: Single frontal view of the chest demonstrates interval placement of a left-sided PICC, tip overlying superior vena cava. 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 Stable multi lead AICD and left-sided nerve stimulator. Cardiac silhouette is unchanged. Continued central vascular  congestion, with patchy bibasilar consolidation right greater than left increased since prior study. No effusion or pneumothorax. No acute bony abnormalities. IMPRESSION: 1. Left-sided PICC as above. 2. Continued central vascular congestion, with progressive bibasilar consolidation favoring atelectasis. Electronically Signed   By: Sharlet Salina M.D.   On: 04/09/2023 17:49   [place] EKG SITE RITE  Result Date: 04/09/2023 If Site Rite image not attached, placement could not be confirmed due to current cardiac rhythm.  ECHOCARDIOGRAM LIMITED  Result Date: 04/07/2023    ECHOCARDIOGRAM LIMITED REPORT   Patient Name:   Austin [name] Date of Exam: 04/07/2023 Medical Rec #:  [id number]              Height:       73.0 in Accession #:    [id number]             Weight:       200.8 lb Date of Birth:  [birth year]               BSA:          2.155 m Patient Age:    82 years               BP:           102/92 mmHg Patient Gender: M                      HR:           108 bpm. Exam Location:  Inpatient Procedure: Limited Echo, Cardiac Doppler and Color Doppler Indications:    I25.5

## 2023-05-02 NOTE — [place] FL2 (Signed)
MEDICAID FL2 LEVEL OF CARE FORM     IDENTIFICATION  Patient Name: Austin [name] Birthdate: 08-15-1940 Sex: male Admission Date (Current Location): 04/29/2023  [facility] and [place] Number:  Producer, television/film/video and Address:  [facility], [address], [place], [place] [address]      Provider Number: [id number]  Attending Physician Name and Address:  Azucena Fallen, MD  Relative Name and Phone Number:       Current Level of Care: Hospital Recommended Level of Care: Skilled Nursing Facility Prior Approval Number:    Date Approved/Denied:   PASRR Number: [id number]  Discharge Plan: SNF    Current Diagnoses: Patient Active Problem List   Diagnosis Date Noted   Postoperative seroma involving circulatory system after other circulatory system procedure 05/01/2023   Hyponatremia 04/27/2023   Insomnia 04/27/2023   Heart failure (HCC) 04/06/2023   Postprocedural seroma of skin and subcutaneous tissue following other procedure 03/23/2023   Callous ulcer, limited to breakdown of skin (HCC) 09/27/2022   OSA on CPAP 09/27/2022   Dyspnea 07/05/2022   Anemia due to zinc deficiency 05/25/2022   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   Stage 3b chronic kidney disease (HCC) 05/17/2022   Anemia 05/17/2022   Need for immunization against influenza 05/17/2022   Chronic anticoagulation 10/14/2018   Ventricular tachycardia (HCC) 10/12/2018   Neuropathy 08/25/2018   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   Nonischemic cardiomyopathy (HCC)    Subtherapeutic international normalized ratio (INR) 01/10/2016   S/P ablation of ventricular arrhythmia 01/10/2016   Hypothyroidism 10/27/2015   H/O mitral valve replacement with mechanical valve    Anticoagulation goal of INR 2.5 to 3.5 08/20/2013   Cardiac device, implant, or graft infection or inflammation (HCC) 06/28/2011   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   Acute on chronic systolic heart failure (HCC) 04/15/2009   ERECTILE DYSFUNCTION 11/27/2008   HEARING LOSS 11/27/2008   Atrial fibrillation (HCC) 11/27/2008   SLEEP APNEA 11/27/2008    Orientation RESPIRATION BLADDER Height & Weight     Self, Time, Situation, Place  O2 Continent Weight: 195 lb 15.8 oz (88.9 kg) Height:  6\' 1"  (185.4 cm)  BEHAVIORAL SYMPTOMS/MOOD NEUROLOGICAL BOWEL NUTRITION STATUS      Continent Diet (please see discharge summary)  AMBULATORY STATUS COMMUNICATION OF NEEDS Skin     Verbally Surgical wounds, Normal (closed incision left chest)                       Personal Care Assistance Level of Assistance  Bathing, Feeding, Dressing Bathing Assistance: Limited assistance Feeding assistance: Independent Dressing Assistance: Limited assistance     Functional Limitations Info  Sight, Hearing, Speech Sight Info: Adequate Hearing Info: Impaired Speech Info: Adequate    SPECIAL CARE FACTORS FREQUENCY  PT (By licensed PT), OT (By licensed OT)     PT Frequency: 5x per week OT Frequency: 5x per week            Contractures Contractures Info: Not present    Additional Factors Info  Code Status, Allergies Code Status Info: FULL Allergies Info: NKA           Current Medications (05/02/2023):  This is the current hospital active medication list Current Facility-Administered Medications  Medication Dose Route Frequency Provider Last Rate Last Admin   acetaminophen (TYLENOL) tablet 650 mg  650 mg Oral Q6H PRN Gery Pray, MD   650  mg at 04/30/23 2250   Or   acetaminophen (TYLENOL) suppository 650 mg  650 mg Rectal Q6H PRN Gery Pray, MD       amiodarone (PACERONE) tablet 400 mg  400 mg Oral BID Crosley, Debby, MD   400 mg at 05/02/23 3762   heparin ADULT infusion 100 units/mL (25000 units/241mL)  1,400 Units/hr Intravenous Continuous Jenita Seashore, RPH 14 mL/hr at 05/01/23 0941 1,400  Units/hr at 05/01/23 0941   iohexol (OMNIPAQUE) 300 MG/ML solution 50 mL  50 mL Per Tube Once PRN Richarda Overlie, MD       levothyroxine (SYNTHROID) tablet 88 mcg  88 mcg Oral Daily Crosley, Debby, MD   88 mcg at 05/02/23 0612   melatonin tablet 5 mg  5 mg Oral QHS Crosley, Debby, MD   5 mg at 05/01/23 2206   nitroGLYCERIN (NITROSTAT) SL tablet 0.4 mg  0.4 mg Sublingual Q5 Min x 3 PRN Crosley, Debby, MD       prochlorperazine (COMPAZINE) injection 5 mg  5 mg Intravenous Q4H PRN Crosley, Debby, MD       quiNIDine sulfate tablet 100 mg  100 mg Oral Q6H Azucena Fallen, MD   100 mg at 05/02/23 8315   senna-docusate (Senokot-S) tablet 1 tablet  1 tablet Oral QHS PRN Gery Pray, MD   1 tablet at 05/01/23 1824   torsemide (DEMADEX) tablet 40 mg  40 mg Oral BID Gery Pray, MD   40 mg at 05/02/23 1761   Warfarin - Pharmacist Dosing Inpatient   Does not apply q1600 Jenita Seashore [facility]   Given at 05/01/23 1706     Discharge Medications: Please see discharge summary for a list of discharge medications.  Relevant Imaging Results:  Relevant Lab Results:   Additional Information SS# [id number]  Eduard Roux, LCSW

## 2023-05-02 NOTE — TOC Transition Note (Signed)
Transition of Care [facility]) - CM/SW Discharge Note   Patient Details  Name: Austin [name] MRN: [id number] Date of Birth: 1940-07-29  Transition of Care [facility] CM/SW Contact:  Eduard Roux, LCSW Phone Number: 05/02/2023, 12:01 PM   Clinical Narrative:     Patient will Discharge to: [facility]  Discharge Date:05/02/2023 Family Notified:  Transport By: [facility] # [phone]  Per MD patient is ready for discharge. RN, patient, and facility notified of discharge. Discharge Summary sent to facility. RN given number for [phone] [facility] Rm 23. Ambulance transport requested for patient.   Clinical Social Worker signing off.  Antony Blackbird, MSW, LCSW Clinical Social Worker    Final next level of care: Skilled Nursing Facility Barriers to Discharge: Barriers Resolved   Patient Goals and CMS Choice      Discharge Placement                Patient chooses bed at:  ([facility])     Patient and family notified of of transfer: 05/02/23  Discharge Plan and Services Additional resources added to the After Visit Summary for                                       Social Determinants of Health (SDOH) Interventions SDOH Screenings   Food Insecurity: No Food Insecurity (04/29/2023)  Housing: Low Risk  (04/29/2023)  Transportation Needs: No Transportation Needs (04/29/2023)  Utilities: Not At Risk (04/29/2023)  Alcohol Screen: Low Risk  (03/08/2023)  Depression (PHQ2-9): Low Risk  (03/08/2023)  Financial Resource Strain: Low Risk  (03/08/2023)  Physical Activity: Insufficiently Active (03/08/2023)  Social Connections: Moderately Integrated (03/08/2023)  Stress: No Stress Concern Present (03/08/2023)  Tobacco Use: Low Risk  (04/29/2023)  Health Literacy: Adequate Health Literacy (03/08/2023)     Readmission Risk Interventions     No data to display

## 2023-05-02 NOTE — Progress Notes (Signed)
Interventional Radiology Brief Note:  Spoke with [name], no need for dedicated follow-up with IR at this time.  [facility] to manage the drain/drain removal and will reach out if IR is needed.   Loyce Dys, MS RD PA-C

## 2023-05-03 ENCOUNTER — Non-Acute Institutional Stay (SKILLED_NURSING_FACILITY): Payer: Medicare Other | Admitting: Adult Health

## 2023-05-03 ENCOUNTER — Encounter: Payer: Self-pay | Admitting: Adult Health

## 2023-05-03 DIAGNOSIS — N184 Chronic kidney disease, stage 4 (severe): Secondary | ICD-10-CM | POA: Diagnosis not present

## 2023-05-03 DIAGNOSIS — E039 Hypothyroidism, unspecified: Secondary | ICD-10-CM | POA: Diagnosis not present

## 2023-05-03 DIAGNOSIS — I97648 Postprocedural seroma of a circulatory system organ or structure following other circulatory system procedure: Secondary | ICD-10-CM

## 2023-05-03 DIAGNOSIS — G4733 Obstructive sleep apnea (adult) (pediatric): Secondary | ICD-10-CM | POA: Diagnosis not present

## 2023-05-03 DIAGNOSIS — I4891 Unspecified atrial fibrillation: Secondary | ICD-10-CM | POA: Diagnosis not present

## 2023-05-03 LAB — COMPREHENSIVE METABOLIC PANEL
Calcium: 8.2 — AB (ref 8.7–10.7)
eGFR: 25

## 2023-05-03 LAB — BASIC METABOLIC PANEL
BUN: 29 — AB (ref 4–21)
CO2: 32 — AB (ref 13–22)
Chloride: 100 (ref 99–108)
Creatinine: 2.6 — AB (ref 0.6–1.3)
Glucose: 98
Potassium: 3.5 meq/L (ref 3.5–5.1)
Sodium: 140 (ref 137–147)

## 2023-05-03 LAB — POCT INR: INR: 1.5 — AB (ref 0.80–1.20)

## 2023-05-03 LAB — PROTIME-INR: Protime: 16.1 — AB (ref 10.0–13.8)

## 2023-05-03 NOTE — Progress Notes (Unsigned)
Location:  [facility] Room Number: N023-A Place of Service:  SNF (31) Provider:  Kenard Gower, DNP, FNP-BC  Patient Care Team: [name], MD as PCP - General (Internal Medicine) Marinus Maw, MD as PCP - Electrophysiology (Cardiology)  Extended Emergency Contact Information Primary Emergency Contact: [facility] Phone: [phone] Relation: Daughter Preferred language: [place] Secondary Emergency Contact: Raymondo Band Address: [address]          [place], [place] [address] [name] Home Phone: [phone] Mobile Phone: [phone] Relation: Spouse  Code Status:  Full Code  Goals of care: Advanced Directive information    05/03/2023   11:23 AM  Advanced Directives  Does Patient Have a Medical Advance Directive? Yes  Type of Advance Directive Out of facility DNR (pink MOST or yellow form)  Does patient want to make changes to medical advance directive? No - Patient declined  Pre-existing out of facility DNR order (yellow form or pink MOST form) Pink MOST form placed in chart (order not valid for inpatient use)     Chief Complaint  Patient presents with   Hospitalization Follow-up    follow up hospitalization          HPI:  Pt is a 82 y.o. male who was admitted to [facility] on 05/02/23 post hospitalization 04/29/23 to 05/02/23. He has a PMH of chronic atrial fibrillation on chronic anticoagulation, history of mechanical mitral valve replacement, chronic kidney disease stage IIIb, hypothyroidism and HFrEF, AICD placement and bladder stimulator placement. He had hematoma on his chest wall S/P neuro stimulator placement infected with pseudomonas. Stimulator was removed on 9/17 and was treated with antibiotics. Drain was removed without complication. He then had worsening chest wall pain and tightness and was sent to ED. CT surgery and IR was consulted. Left chest wall drain placement was done  on 04/30/23. He was resumed on Coumadin with Lovenox bridge.    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   Conductive hearing loss    uses hearing aids   Dysrhythmia    A-fib   History of blood transfusion 03/24/2023   Hypothyroidism    Intraspinal abscess    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   Nonischemic cardiomyopathy (HCC)    Psychosexual dysfunction with inhibited sexual excitement    S/P mitral valve replacement    Syncope and collapse    Unspecified sleep apnea    last sleep study 11/07, uses cpap   Ventricular tachycardia (HCC)    VT (ventricular tachycardia) (HCC)    Past Surgical History:  Procedure Laterality Date   BIV ICD GENERATOR CHANGEOUT N/A 05/04/2017   Procedure: BiV ICD Generator Changeout;  Surgeon: Marinus Maw, MD;  Location: [facility] INVASIVE CV LAB;  Service: Cardiovascular;  Laterality: N/A;   CARDIAC CATHETERIZATION  04/24/2002   CARDIAC VALVE REPLACEMENT     CARDIOVERSION  03/09/2012   Procedure: CARDIOVERSION;  Surgeon: Marinus Maw, MD;  Location: [facility] ENDOSCOPY;  Service: Cardiovascular;  Laterality: N/A;   CARDIOVERSION N/A 11/22/2013   Procedure: CARDIOVERSION;  Surgeon: Thurmon Fair, MD;  Location: [facility] ENDOSCOPY;  Service: Cardiovascular;  Laterality: N/A;  dental implants     ELECTROPHYSIOLOGIC STUDY N/A 09/15/2015   Procedure: V Tach Ablation;  Surgeon: Marinus Maw, MD;  Location: [facility] INVASIVE CV LAB;  Service: Cardiovascular;  Laterality: N/A;   Evacution of epidural lumbar epidural abscess  07/26/1997   INCISION AND DRAINAGE OF WOUND Left 03/25/2023   Procedure: Washout of Chest Wall Hematoma;  Surgeon: Nada Libman, MD;  Location: [facility] OR;  Service: Vascular;  Laterality: Left;   INCISION AND DRAINAGE OF WOUND N/A 03/29/2023   Procedure: IRRIGATION AND DEBRIDEMENT  CHEST WALL;  Surgeon: Nada Libman, MD;  Location: [facility] OR;  Service: Vascular;  Laterality: N/A;   INSERT / REPLACE / REMOVE PACEMAKER  11/23/2008   IR US GUIDE BX ASP/DRAIN  04/30/2023   MITRAL VALVE REPLACEMENT     w #[id number] [facility]   PERMANENT PACEMAKER INSERTION N/A 08/05/2011   Procedure: PERMANENT PACEMAKER INSERTION;  Surgeon: Marinus Maw, MD;  Location: [facility] CATH LAB;  Service: Cardiovascular;  Laterality: N/A;   REMOVAL OF BAROSTIM N/A 04/12/2023   Procedure: REMOVAL OF [name];  Surgeon: Nada Libman, MD;  Location: [facility] OR;  Service: Vascular;  Laterality: N/A;   RIGHT HEART CATH N/A 02/26/2021   Procedure: RIGHT HEART CATH;  Surgeon: Laurey Morale, MD;  Location: [facility] INVASIVE CV LAB;  Service: Cardiovascular;  Laterality: N/A;   THYROIDECTOMY     TONSILLECTOMY     V-TACH ABLATION  07/30/2021   in CE   VALVE REPLACEMENT  07/26/1998    No Known Allergies  Outpatient Encounter Medications as of 05/03/2023  Medication Sig   acetaminophen (TYLENOL) 500 MG tablet Take 1,000 mg by mouth every 8 (eight) hours as needed for headache, fever or mild pain.   amiodarone (PACERONE) 200 MG tablet Take amio 400 mg twice a day   enoxaparin (LOVENOX) 80 MG/0.8ML injection Inject 0.8 mLs (80 mg total) into the skin 2 (two) times daily. Discontinue once INR is therapeutic   levothyroxine (SYNTHROID) 88 MCG tablet TAKE 1 TABLET BY MOUTH EVERY DAY   melatonin 5 MG TABS Take 5 mg by mouth at bedtime.   mirtazapine (REMERON) 7.5 MG tablet Take 1 tablet (7.5 mg total) by mouth at bedtime.   Multiple Vitamin (MULTIVITAMIN WITH MINERALS) TABS tablet Take 1 tablet by mouth in the morning.   mupirocin ointment (BACTROBAN) 2 % Apply 1 Application topically daily.   nitroGLYCERIN (NITROSTAT) 0.4 MG SL tablet Place 1 tablet (0.4 mg total) under  the tongue every 5 (five) minutes x 3 doses as needed for chest pain.   quiniDINE gluconate 324 MG CR tablet TAKE 1 TABLET BY MOUTH 2 TIMES DAILY.   torsemide (DEMADEX) 20 MG tablet Take 2 tablets (40 mg total) by mouth 2 (two) times daily.   warfarin (COUMADIN) 2.5 MG tablet Take 1 tablet (2.5 mg total) by mouth daily at 4 PM.   No facility-administered encounter medications on file as of 05/03/2023.    Review of Systems  Constitutional:  Negative for activity change, appetite change and fever.  HENT:  Negative for sore throat.   Eyes: Negative.   Cardiovascular:  Positive for leg swelling. Negative for chest pain.  Gastrointestinal:  Negative for abdominal distention, diarrhea and vomiting.  Genitourinary:  Negative for dysuria, frequency and urgency.  Skin:  Negative for color change.  Neurological:  Negative for dizziness.  Psychiatric/Behavioral:  Negative for behavioral problems and sleep disturbance. The patient is not nervous/anxious.  Immunization History  Administered Date(s) Administered   Fluad Quad(high Dose 65+) 05/17/2022   Influenza, High Dose Seasonal PF 05/11/2017, 04/18/2018, 05/04/2019, 04/18/2020, 04/27/2023   PFIZER(Purple Top)SARS-COV-2 Vaccination 08/14/2019, 09/03/2019, 05/09/2020   PNEUMOCOCCAL CONJUGATE-20 04/01/2023   PPD Test 04/23/2023   Pneumococcal Conjugate-13 01/17/2015   Pneumococcal Polysaccharide-23 01/08/2008   Td (Adult),unspecified 05/17/2013   Pertinent  Health Maintenance Due  Topic Date Due   INFLUENZA VACCINE  Completed      02/26/2021   10:56 AM 08/14/2021    4:08 PM 05/17/2022    9:50 AM 09/27/2022    3:33 PM 03/08/2023    2:19 PM  Fall Risk  Falls in the past year?   0 0 0  Was there an injury with Fall?   0 0 0  Fall Risk Category Calculator   0 0 0  Fall Risk Category (Retired)   Low    (RETIRED) Patient Fall Risk Level Moderate fall risk Low fall risk     Patient at Risk for Falls Due to    No Fall Risks No Fall Risks   Fall risk Follow up    Falls evaluation completed Falls prevention discussed;Falls evaluation completed     Vitals:   05/03/23 1059  BP: 127/79  Pulse: 84  Resp: 20  Temp: (!) 97.4 F (36.3 C)  SpO2: 94%  Weight: 193 lb 11.2 oz (87.9 kg)  Height: 6\' 1"  (1.854 m)   Body mass index is 25.56 kg/m.  Physical Exam Constitutional:      Appearance: Normal appearance.  HENT:     Head: Normocephalic and atraumatic.     Mouth/Throat:     Mouth: Mucous membranes are moist.  Eyes:     Conjunctiva/sclera: Conjunctivae normal.  Cardiovascular:     Rate and Rhythm: Normal rate and regular rhythm.     Pulses: Normal pulses.     Heart sounds: Normal heart sounds.     Comments: AICD on right chest  -  has surgical wound with 5 staples on left chest, dry Pulmonary:     Effort: Pulmonary effort is normal.     Breath sounds: Normal breath sounds.  Abdominal:     General: Bowel sounds are normal.     Palpations: Abdomen is soft.  Musculoskeletal:        General: No swelling.     Cervical back: Normal range of motion.     Right lower leg: Edema present.     Left lower leg: Edema present.     Comments: RUE 2+edema and BLE 1+edema  Skin:    General: Skin is warm and dry.     Comments: Left chest drain attached to JP bulb  Neurological:     General: No focal deficit present.     Mental Status: He is alert and oriented to person, place, and time.     Motor: Weakness present.  Psychiatric:        Mood and Affect: Mood normal.        Behavior: Behavior normal.        Thought Content: Thought content normal.        Judgment: Judgment normal.        Labs reviewed: Recent Labs    04/10/23 1405 04/11/23 0434 04/12/23 0429 04/12/23 0431 04/18/23 0306 04/19/23 0330 04/30/23 0400 05/01/23 0329 05/02/23 0457  NA 130*   < >  --    < > 130*   < > 139 142 140  K 4.0   < >  --    < >  3.4*   < > 2.7* 3.1* 3.3*  CL 95*   < >  --    < > 91*   < > 95* 98 100  CO2 24   < >  --    <  > 27   < > 29 29 32  GLUCOSE 224*   < >  --    < > 166*   < > 87 90 98  BUN 35*   < >  --    < > 48*   < > 33* 29* 27*  CREATININE 2.28*   < >  --    < > 3.88*   < > 2.66* 2.38* 2.24*  CALCIUM 8.0*   < >  --    < > 8.2*   < > 7.9* 8.3* 8.2*  MG  --    < > 2.2  --  2.4  --  2.1  --   --   PHOS 4.0  --   --   --   --   --   --   --   --    < > = values in this interval not displayed.   Recent Labs    03/24/23 0503 04/06/23 1030 04/10/23 1405 04/28/23 0000 04/29/23 1720  AST 25 30  --  26 49*  ALT 20 24  --  23 26  ALKPHOS 63 78  --  89 83  BILITOT 0.6 1.4*  --   --  0.8  PROT 5.2* 5.9*  --   --  5.3*  ALBUMIN 2.9* 3.2* 3.0* 3.1* 2.8*   Recent Labs    04/28/23 0000 04/29/23 1542 04/30/23 0400 05/01/23 0329 05/02/23 0457  WBC 9.7 11.7* 9.0 8.5 7.7  NEUTROABS 8,022.00 9.7* 7.3  --   --   HGB 8.9* 9.3* 9.0* 8.6* 8.8*  HCT 29* 29.9* 28.7* 27.8* 28.0*  MCV  --  99.0 98.6 95.9 100.0  PLT 132* PLATELET CLUMPS NOTED ON SMEAR, COUNT APPEARS DECREASED 119* 113* 121*   Lab Results  Component Value Date   TSH 17.85 (A) 04/28/2023   No results found for: "HGBA1C" Lab Results  Component Value Date   CHOL 158 08/23/2015   HDL 50 08/23/2015   LDLCALC 82 08/23/2015   TRIG 130 08/23/2015   CHOLHDL 3.2 08/23/2015    Significant Diagnostic Results in last 30 days:  IR US Guide Bx Asp/Drain  Result Date: 04/30/2023 INDICATION: 82 year old with history of a Barostim device removed from the left chest. Patient had a JP drain placed in surgery for a seroma. The drain has been dislodged. Patient has a recurrent superficial fluid collection in the left anterior chest. EXAM: ULTRASOUND-GUIDED PLACEMENT OF DRAINAGE CATHETER IN LEFT ANTERIOR CHEST WALL FLUID COLLECTION MEDICATIONS: 1% lidocaine for local anesthetic ANESTHESIA/SEDATION: None COMPLICATIONS: None immediate. PROCEDURE: Informed written consent was obtained from the patient after a thorough discussion of the procedural risks,  benefits and alternatives. All questions were addressed. Maximal Sterile Barrier Technique was utilized including caps, mask, sterile gowns, sterile gloves, sterile drape, hand hygiene and skin antiseptic. A timeout was performed prior to the initiation of the procedure. Ultrasound demonstrated a large fluid collection in the left anterior chest. Left chest was prepped and draped in sterile fashion. Skin was anesthetized with 1% lidocaine along the lateral aspect of the chest. Small incision was made. Using ultrasound guidance, an 18 gauge trocar needle was directed into the fluid collection and brown fluid was aspirated. Superstiff Amplatz wire  was placed. Tract was dilated to accommodate a 12 [place] multipurpose drain. Greater than 150 mL of fluid was initially removed. Drain was sutured to the skin and attached to a suction bulb. Dressing was placed. Ultrasound images were taken and saved for this procedure. FINDINGS: Large relatively simple fluid collection in the left anterior chest wall. Drain was confirmed within the fluid collection using ultrasound guidance. Greater than 150 mL of brown fluid was removed. IMPRESSION: Ultrasound-guided placement of drainage catheter in the left anterior chest wall fluid collection. Electronically Signed   By: Richarda Overlie M.D.   On: 04/30/2023 19:18   CT Chest Wo Contrast  Result Date: 04/29/2023 CLINICAL DATA:  Postoperative swelling. Recent removal of marrow stem device. JP drain removed 4 days ago. EXAM: CT CHEST WITHOUT CONTRAST TECHNIQUE: Multidetector CT imaging of the chest was performed following the standard protocol without IV contrast. RADIATION DOSE REDUCTION: This exam was performed according to the departmental dose-optimization program which includes automated exposure control, adjustment of the mA and/or kV according to patient size and/or use of iterative reconstruction technique. COMPARISON:  Radiograph 04/09/2023. FINDINGS: Cardiovascular: The heart is  enlarged. Prosthetic mitral valve. Pacemaker in place. No pericardial effusion. Aortic atherosclerosis without aneurysm. No pericardial effusion. Mediastinum/Nodes: No mediastinal adenopathy. Assessment for hilar adenopathy is limited in the absence of IV contrast. No definite enlarged axillary nodes. Left lobe of the thyroid gland is diminutive or absent. Decompressed esophagus. Lungs/Pleura: Anterior left upper lobe opacity with air bronchogram is indeterminate for atelectasis or infection. Compressive atelectasis in the right lower lobe related to elevated hemidiaphragm. No pleural effusion or features of pulmonary edema. Upper Abdomen: Fatty atrophy of the pancreas. No acute upper abdominal findings. Musculoskeletal: Ill-defined fluid collection in the left anterior chest wall. Insert measurements are difficult due to irregular shape, however this measures approximately 13.8 x 4.6 x 10.4 cm, series 3, image 62 and series 5, image 101. This contains an air-fluid level medially. Adjacent strandy density and ill-defined fluid anteriorly and tracking inferiorly. Mild bilateral gynecomastia. Skin staples in the anterior left upper chest wall at the superior aspect of fluid collection. There are no acute or suspicious osseous abnormalities. Bilateral gynecomastia. Chronic bilateral shoulder arthropathy. Prior median sternotomy. IMPRESSION: 1. Ill-defined fluid collection in the left anterior chest wall measuring approximately 13.8 x 4.6 x 10.4 cm. This contains an air-fluid level medially. This may represent a postoperative seroma, hematoma, or abscess. Sterility is indeterminate by imaging. 2. Anterior left upper lobe opacity with air bronchogram is indeterminate for atelectasis or infection. 3. Cardiomegaly. Aortic Atherosclerosis (ICD10-I70.0). Electronically Signed   By: Narda Rutherford M.D.   On: 04/29/2023 18:35   DG Chest Port 1 View  Result Date: 04/09/2023 CLINICAL DATA:  PICC line placement EXAM:  PORTABLE CHEST 1 VIEW COMPARISON:  04/06/2023 FINDINGS: Single frontal view of the chest demonstrates interval placement of a left-sided PICC, tip overlying superior vena cava. 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 Stable multi lead AICD and left-sided nerve stimulator. Cardiac silhouette is unchanged. Continued central vascular congestion, with patchy bibasilar consolidation right greater than left increased since prior study. No effusion or pneumothorax. No acute bony abnormalities. IMPRESSION: 1. Left-sided PICC as above. 2. Continued central vascular congestion, with progressive bibasilar consolidation favoring atelectasis. Electronically Signed   By: Sharlet Salina M.D.   On: 04/09/2023 17:49   [place] EKG SITE RITE  Result Date: 04/09/2023 If Site Rite image not attached, placement could not be confirmed due to current cardiac  rhythm.  ECHOCARDIOGRAM LIMITED  Result Date: 04/07/2023    ECHOCARDIOGRAM LIMITED REPORT   Patient Name:   Austin [name] Date of Exam: 04/07/2023 Medical Rec #:  [id number]              Height:       73.0 in Accession #:    [id number]             Weight:       200.8 lb Date of Birth:  1941/05/17               BSA:          2.155 m Patient Age:    82 years               BP:           102/92 mmHg Patient Gender: M                      HR:           108 bpm. 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 Cardiomyopathy and CHF, Defibrillator and                 Abnormal ECG, Arrythmias:Atrial Fibrillation, Atrial Flutter and                 VT; Signs/Symptoms:Dyspnea and Shortness of Breath.                  Mitral Valve: unknown size mechanical valve valve is present in                 the mitral position.  Sonographer:    Sheralyn Boatman RDCS Referring Phys:  Dorthula Nettles  Sonographer Comments: Patient has wound vac in parasternal region. IMPRESSIONS  1. Left ventricular ejection fraction, by estimation, is 20%. The left ventricle has severely decreased function. The left ventricle demonstrates global hypokinesis. No LV thrombus noted. Indeterminant diastolic function.  2. Peak RV-RA gradient 19 mmHg. IVC not visualized. Right ventricular systolic function is mildly reduced. The right ventricular size is normal.  3. Mechanical mitral valve with mean gradient 8 mmHg, increased from prior. No mitral regurgitation noted though shadowing from prosthetic valve makes this difficult to discern.  4. Tricuspid valve regurgitation is moderate.  5. The aortic valve is tricuspid. There is moderate calcification of the aortic valve. Doppler evaluation of aortic valve not done.  6. Limited echo. FINDINGS  Left Ventricle: Left ventricular ejection fraction, by estimation, is 20%. The left ventricle has severely decreased function. The left ventricle demonstrates global hypokinesis. Left ventricular diastolic parameters are indeterminate. Right Ventricle: Peak RV-RA gradient 19 mmHg. IVC not visualized. The right ventricular size is normal. No increase in right ventricular wall thickness. Right ventricular systolic function is mildly reduced. Mitral Valve: Mechanical mitral valve with mean gradient 8 mmHg, increased from prior. No mitral regurgitation noted though shadowing from prosthetic valve makes this difficult to discern. There is a unknown size mechanical valve present in the mitral position. MV peak gradient, 10.4 mmHg. The mean mitral valve gradient is 8.0 mmHg. Tricuspid Valve: The tricuspid valve is normal in structure. Tricuspid valve regurgitation is moderate. Aortic Valve: The  aortic valve is tricuspid. There is moderate calcification of the aortic valve. Venous: The inferior vena cava was not well visualized. Additional Comments: A device lead is visualized in the  right ventricle. Spectral Doppler performed. Color Doppler performed.  LEFT VENTRICLE PLAX 2D LVOT diam:     2.70 cm LV SV:         55 LV SV Index:   26 LVOT Area:     5.73 cm  LV Volumes (MOD) LV vol d, MOD A2C: 177.0 ml LV vol d, MOD A4C: 155.0 ml LV vol s, MOD A2C: 148.0 ml LV vol s, MOD A4C: 119.0 ml LV SV MOD A2C:     29.0 ml LV SV MOD A4C:     155.0 ml LV SV MOD BP:      35.4 ml AORTIC VALVE LVOT Vmax:   79.60 cm/s LVOT Vmean:  47.500 cm/s LVOT VTI:    0.096 m MITRAL VALVE              TRICUSPID VALVE MV Area VTI:  2.19 cm    TR Peak grad:   19.0 mmHg MV Peak grad: 10.4 mmHg   TR Vmax:        218.00 cm/s MV Mean grad: 8.0 mmHg MV Vmax:      1.62 m/s    SHUNTS MV Vmean:     110.9 cm/s  Systemic VTI:  0.10 m                           Systemic Diam: 2.70 cm Dalton McleanMD Electronically signed by Wilfred Lacy Signature Date/Time: 04/07/2023/2:14:27 PM    Final    DG Chest Portable 1 View  Result Date: 04/06/2023 CLINICAL DATA:  Shortness of breath. EXAM: PORTABLE CHEST 1 VIEW COMPARISON:  March 23, 2023. FINDINGS: Stable cardiomediastinal silhouette. Right-sided defibrillator is again noted. Mild central pulmonary vascular congestion is noted with possible bibasilar atelectasis or edema. Degenerative changes seen involving both shoulder joints. Status post mitral valve repair. IMPRESSION: Mild central pulmonary vascular congestion is noted with possible bibasilar atelectasis or edema. Electronically Signed   By: Lupita Raider M.D.   On: 04/06/2023 11:42    Assessment/Plan  1. Postoperative seroma involving circulatory system after other circulatory system procedure -  S/P drain placement on 04/30/23 -  follow up with vascular surgery  2. Atrial fibrillation, unspecified type (HCC) -  on Coumadin 2.5 mg daily with Lovenox bridge  -  INR 1.5, 05/03/23  3. Chronic kidney disease, stage 4 (severe) (HCC) Lab Results  Component Value Date   NA 140 05/03/2023   K 3.5 05/03/2023   CO2 32 (A)  05/03/2023   GLUCOSE 98 05/02/2023   BUN 29 (A) 05/03/2023   CREATININE 2.6 (A) 05/03/2023   CALCIUM 8.2 (A) 05/03/2023   GFR 26.83 (L) 09/27/2022   EGFR 25 05/03/2023   GFRNONAA 29 (L) 05/02/2023    -  stable  4. OSA on CPAP -  CPAP at bedtime  5. Hypothyroidism, unspecified type Lab Results  Component Value Date   TSH 17.85 (A) 04/28/2023    -  continue Levothyroxine   Family/ staff Communication: Discussed plan of care with resident and charge nurse.  Labs/tests ordered: None    Kenard Gower, DNP, MSN, FNP-BC [facility] [phone] (Monday-Friday 8:00 a.m. - 5:00 p.m.) [phone] (after hours)

## 2023-05-04 ENCOUNTER — Other Ambulatory Visit: Payer: Self-pay

## 2023-05-04 ENCOUNTER — Emergency Department (HOSPITAL_COMMUNITY): Payer: Medicare Other

## 2023-05-04 ENCOUNTER — Encounter (HOSPITAL_COMMUNITY): Payer: Self-pay | Admitting: *Deleted

## 2023-05-04 ENCOUNTER — Emergency Department (HOSPITAL_COMMUNITY)
Admission: EM | Admit: 2023-05-04 | Discharge: 2023-05-04 | Disposition: A | Discharge status: Home / Self Care | Payer: Medicare Other | Attending: Emergency Medicine | Admitting: Emergency Medicine

## 2023-05-04 DIAGNOSIS — Z7901 Long term (current) use of anticoagulants: Secondary | ICD-10-CM | POA: Diagnosis not present

## 2023-05-04 DIAGNOSIS — R231 Pallor: Secondary | ICD-10-CM | POA: Insufficient documentation

## 2023-05-04 DIAGNOSIS — W1839XA Other fall on same level, initial encounter: Secondary | ICD-10-CM | POA: Insufficient documentation

## 2023-05-04 DIAGNOSIS — R55 Syncope and collapse: Secondary | ICD-10-CM | POA: Insufficient documentation

## 2023-05-04 DIAGNOSIS — W19XXXA Unspecified fall, initial encounter: Secondary | ICD-10-CM

## 2023-05-04 DIAGNOSIS — R404 Transient alteration of awareness: Secondary | ICD-10-CM | POA: Diagnosis not present

## 2023-05-04 DIAGNOSIS — I517 Cardiomegaly: Secondary | ICD-10-CM | POA: Diagnosis not present

## 2023-05-04 DIAGNOSIS — I509 Heart failure, unspecified: Secondary | ICD-10-CM | POA: Diagnosis not present

## 2023-05-04 DIAGNOSIS — R42 Dizziness and giddiness: Secondary | ICD-10-CM | POA: Diagnosis not present

## 2023-05-04 DIAGNOSIS — R0989 Other specified symptoms and signs involving the circulatory and respiratory systems: Secondary | ICD-10-CM | POA: Diagnosis not present

## 2023-05-04 DIAGNOSIS — R5383 Other fatigue: Secondary | ICD-10-CM | POA: Diagnosis not present

## 2023-05-04 DIAGNOSIS — Z7401 Bed confinement status: Secondary | ICD-10-CM | POA: Diagnosis not present

## 2023-05-04 DIAGNOSIS — I6782 Cerebral ischemia: Secondary | ICD-10-CM | POA: Diagnosis not present

## 2023-05-04 DIAGNOSIS — N189 Chronic kidney disease, unspecified: Secondary | ICD-10-CM | POA: Diagnosis not present

## 2023-05-04 DIAGNOSIS — S4981XA Other specified injuries of right shoulder and upper arm, initial encounter: Secondary | ICD-10-CM | POA: Diagnosis not present

## 2023-05-04 DIAGNOSIS — R531 Weakness: Secondary | ICD-10-CM | POA: Diagnosis not present

## 2023-05-04 DIAGNOSIS — I4891 Unspecified atrial fibrillation: Secondary | ICD-10-CM | POA: Insufficient documentation

## 2023-05-04 DIAGNOSIS — Z743 Need for continuous supervision: Secondary | ICD-10-CM | POA: Diagnosis not present

## 2023-05-04 DIAGNOSIS — S0990XA Unspecified injury of head, initial encounter: Secondary | ICD-10-CM | POA: Diagnosis not present

## 2023-05-04 LAB — TYPE AND SCREEN
ABO/RH(D): A POS
Antibody Screen: NEGATIVE

## 2023-05-04 LAB — CBC WITH DIFFERENTIAL/PLATELET
Abs Immature Granulocytes: 0.08 10*3/uL — ABNORMAL HIGH (ref 0.00–0.07)
Basophils Absolute: 0.1 10*3/uL (ref 0.0–0.1)
Basophils Relative: 1 %
Eosinophils Absolute: 0.3 10*3/uL (ref 0.0–0.5)
Eosinophils Relative: 4 %
HCT: 29.7 % — ABNORMAL LOW (ref 39.0–52.0)
Hemoglobin: 9.2 g/dL — ABNORMAL LOW (ref 13.0–17.0)
Immature Granulocytes: 1 %
Lymphocytes Relative: 5 %
Lymphs Abs: 0.4 10*3/uL — ABNORMAL LOW (ref 0.7–4.0)
MCH: 30.8 pg (ref 26.0–34.0)
MCHC: 31 g/dL (ref 30.0–36.0)
MCV: 99.3 fL (ref 80.0–100.0)
Monocytes Absolute: 0.9 10*3/uL (ref 0.1–1.0)
Monocytes Relative: 10 %
Neutro Abs: 7 10*3/uL (ref 1.7–7.7)
Neutrophils Relative %: 79 %
Platelets: 133 10*3/uL — ABNORMAL LOW (ref 150–400)
RBC: 2.99 MIL/uL — ABNORMAL LOW (ref 4.22–5.81)
RDW: 18 % — ABNORMAL HIGH (ref 11.5–15.5)
WBC: 8.8 10*3/uL (ref 4.0–10.5)
nRBC: 0 % (ref 0.0–0.2)

## 2023-05-04 LAB — URINALYSIS, ROUTINE W REFLEX MICROSCOPIC
Bilirubin Urine: NEGATIVE
Glucose, UA: NEGATIVE mg/dL
Hgb urine dipstick: NEGATIVE
Ketones, ur: NEGATIVE mg/dL
Leukocytes,Ua: NEGATIVE
Nitrite: NEGATIVE
Protein, ur: NEGATIVE mg/dL
Specific Gravity, Urine: 1.009 (ref 1.005–1.030)
pH: 6 (ref 5.0–8.0)

## 2023-05-04 LAB — COMPREHENSIVE METABOLIC PANEL
ALT: 27 U/L (ref 0–44)
AST: 33 U/L (ref 15–41)
Albumin: 3 g/dL — ABNORMAL LOW (ref 3.5–5.0)
Alkaline Phosphatase: 103 U/L (ref 38–126)
Anion gap: 12 (ref 5–15)
BUN: 30 mg/dL — ABNORMAL HIGH (ref 8–23)
CO2: 28 mmol/L (ref 22–32)
Calcium: 8.3 mg/dL — ABNORMAL LOW (ref 8.9–10.3)
Chloride: 98 mmol/L (ref 98–111)
Creatinine, Ser: 2.65 mg/dL — ABNORMAL HIGH (ref 0.61–1.24)
GFR, Estimated: 24 mL/min — ABNORMAL LOW (ref >60)
Glucose, Bld: 105 mg/dL — ABNORMAL HIGH (ref 70–99)
Potassium: 3.3 mmol/L — ABNORMAL LOW (ref 3.5–5.1)
Sodium: 138 mmol/L (ref 135–145)
Total Bilirubin: 1 mg/dL (ref 0.3–1.2)
Total Protein: 5.7 g/dL — ABNORMAL LOW (ref 6.5–8.1)

## 2023-05-04 LAB — BRAIN NATRIURETIC PEPTIDE: B Natriuretic Peptide: 1064.3 pg/mL — ABNORMAL HIGH (ref 0.0–100.0)

## 2023-05-04 LAB — I-STAT CG4 LACTIC ACID, ED: Lactic Acid, Venous: 1.8 mmol/L (ref 0.5–1.9)

## 2023-05-04 LAB — T4, FREE: Free T4: 1.18 ng/dL — ABNORMAL HIGH (ref 0.61–1.12)

## 2023-05-04 LAB — TSH: TSH: 17.822 u[IU]/mL — ABNORMAL HIGH (ref 0.350–4.500)

## 2023-05-04 MED ORDER — POTASSIUM CHLORIDE ER 10 MEQ PO TBCR: 10.0000 meq | EXTENDED_RELEASE_TABLET | Freq: Every day | ORAL | 0 refills | Status: DC

## 2023-05-04 MED ORDER — POTASSIUM CHLORIDE 10 MEQ/100ML IV SOLN
10.0000 meq | Freq: Once | INTRAVENOUS | Status: AC
  Administered 2023-05-04: 10 meq via INTRAVENOUS
  Filled 2023-05-04: qty 100

## 2023-05-04 NOTE — ED Triage Notes (Signed)
Pt is here for fall at [facility].  Pt fell yesterday and today around 9am due to "dizzy spell".  No LOC, did not hit head.  Pt is on Coumadin.  Pt has some abrasions to right leg.  Pt has been feeling generally weak lately.  VSS for ems

## 2023-05-04 NOTE — Discharge Instructions (Signed)
Please continue taking all of your medications as prescribed.  I would also like you to begin taking potassium over the next few days as yours was a little bit low.  Please follow-up with your primary care doctor this week.  For the staff at Austin [name]'s facility.  While he was in the emergency department, he had imaging done of his head and his chest that was normal.  His blood work is within his usual range.  Please continue all of his medications, I have also prescribed some potassium for him.  He should follow-up with his regular doctor.  He should come back to the emergency department if he has another episode where he nearly loses consciousness or falls, or has difficulty with his breathing.

## 2023-05-04 NOTE — ED Provider Notes (Signed)
[facility] AT [facility] Provider Note   CSN: [id number] Arrival date & time: 05/04/23  1412     History  Chief Complaint  Patient presents with   Austin [name] is a 82 y.o. male.  The history is provided by the patient and medical records. No language interpreter was used.  Fall This is a new problem. The current episode started 3 to 5 hours ago. The problem occurs rarely. The problem has not changed since onset.Pertinent negatives include no chest pain, no abdominal pain, no headaches and no shortness of breath. Nothing aggravates the symptoms. Nothing relieves the symptoms. He has tried nothing for the symptoms. The treatment provided no relief.       Home Medications Prior to Admission medications   Medication Sig Start Date End Date Taking? Authorizing Provider  acetaminophen (TYLENOL) 500 MG tablet Take 1,000 mg by mouth every 8 (eight) hours as needed for headache, fever or mild pain.    [provider]  amiodarone (PACERONE) 200 MG tablet Take amio 400 mg twice a day 04/22/23   Clegg, Amy D, NP  enoxaparin (LOVENOX) 80 MG/0.8ML injection Inject 0.8 mLs (80 mg total) into the skin 2 (two) times daily. Discontinue once INR is therapeutic 05/02/23   Azucena Fallen, MD  levothyroxine (SYNTHROID) 88 MCG tablet TAKE 1 TABLET BY MOUTH EVERY DAY 03/23/23   [name], MD  melatonin 5 MG TABS Take 5 mg by mouth at bedtime.    [provider]  mirtazapine (REMERON) 7.5 MG tablet Take 1 tablet (7.5 mg total) by mouth at bedtime. 04/25/23   Venita Sheffield, MD  Multiple Vitamin (MULTIVITAMIN WITH MINERALS) TABS tablet Take 1 tablet by mouth in the morning.    [provider]  mupirocin ointment (BACTROBAN) 2 % Apply 1 Application topically daily. 03/01/23   McDonald, Rachelle Hora, DPM  nitroGLYCERIN (NITROSTAT) 0.4 MG SL tablet Place 1 tablet (0.4 mg total) under the tongue every 5 (five) minutes x 3 doses  as needed for chest pain. 10/14/18   Abelino Derrick, PA-C  quiniDINE gluconate 324 MG CR tablet TAKE 1 TABLET BY MOUTH 2 TIMES DAILY. 01/25/23   Laurey Morale, MD  torsemide (DEMADEX) 20 MG tablet Take 2 tablets (40 mg total) by mouth 2 (two) times daily. 04/22/23   Clegg, Amy D, NP  warfarin (COUMADIN) 2.5 MG tablet Take 1 tablet (2.5 mg total) by mouth daily at 4 PM. 04/22/23   Clegg, Amy D, NP      Allergies    Patient has no known allergies.    Review of Systems   Review of Systems  Constitutional:  Positive for fatigue. Negative for chills.  HENT:  Negative for congestion.   Respiratory:  Negative for cough, choking and shortness of breath.   Cardiovascular:  Negative for chest pain.  Gastrointestinal:  Negative for abdominal pain, constipation, diarrhea, nausea and vomiting.  Genitourinary:  Negative for flank pain.  Musculoskeletal:  Negative for back pain, neck pain and neck stiffness.  Skin:  Positive for pallor and wound (abrasons on knees).  Neurological:  Positive for light-headedness. Negative for dizziness, syncope, weakness and headaches.  Psychiatric/Behavioral:  Negative for agitation and confusion.   All other systems reviewed and are negative.   Physical Exam Updated Vital Signs BP 97/63 (BP Location: Right Arm)   Pulse 85   Temp 97.6 F (36.4 C) (Oral)   Resp 18   Ht  6\' 1"  (1.854 m)   Wt 90.7 kg   SpO2 99%   BMI 26.39 kg/m  Physical Exam Vitals and nursing note reviewed.  Constitutional:      General: He is not in acute distress.    Appearance: He is well-developed. He is not ill-appearing, toxic-appearing or diaphoretic.  HENT:     Head: Normocephalic and atraumatic.     Nose: No congestion or rhinorrhea.     Mouth/Throat:     Mouth: Mucous membranes are moist.  Eyes:     Conjunctiva/sclera: Conjunctivae normal.  Cardiovascular:     Rate and Rhythm: Normal rate and regular rhythm.     Heart sounds: Murmur heard.  Pulmonary:     Effort: Pulmonary  effort is normal. No respiratory distress.     Breath sounds: Rhonchi and rales present. No wheezing.  Chest:     Chest wall: No tenderness.  Abdominal:     General: Abdomen is flat.     Palpations: Abdomen is soft.     Tenderness: There is no abdominal tenderness. There is no guarding or rebound.  Musculoskeletal:        General: Signs of injury present. No swelling or tenderness.     Cervical back: Neck supple. No tenderness.     Right lower leg: No edema.     Left lower leg: No edema.     Comments: Abrasion/skin tears on both knees, no laceration seen.  Skin:    General: Skin is warm and dry.     Capillary Refill: Capillary refill takes less than 2 seconds.     Findings: No erythema or rash.  Neurological:     General: No focal deficit present.     Mental Status: He is alert.     Sensory: No sensory deficit.     Motor: No weakness.  Psychiatric:        Mood and Affect: Mood normal.     ED Results / Procedures / Treatments   Labs (all labs ordered are listed, but only abnormal results are displayed) Labs Reviewed  CULTURE, BLOOD (ROUTINE X 2)  CULTURE, BLOOD (ROUTINE X 2)  CBC WITH DIFFERENTIAL/PLATELET  COMPREHENSIVE METABOLIC PANEL  LACTIC ACID, PLASMA  LACTIC ACID, PLASMA  TSH  BRAIN NATRIURETIC PEPTIDE  URINALYSIS, ROUTINE W REFLEX MICROSCOPIC  TYPE AND SCREEN    EKG EKG Interpretation Date/Time:  Wednesday May 04 2023 14:27:06 EDT Ventricular Rate:  84 PR Interval:  143 QRS Duration:  202 QT Interval:  453 QTC Calculation: 536 R Axis:   -85  Text Interpretation: A-V dual-paced rhythm with some inhibition No further analysis attempted due to paced rhythm when compared to prior, now paced. No STEMI Confirmed by Theda Belfast ([id number]) on 05/04/2023 2:47:46 PM  Radiology No results found.  Procedures Procedures    Medications Ordered in ED Medications - No data to display  ED Course/ Medical Decision Making/ A&P                                  Medical Decision Making Amount and/or Complexity of Data Reviewed Labs: ordered. Radiology: ordered.    Austin [name] is a 82 y.o. male with a past medical history significant for atrial fibrillation on Coumadin therapy, previous mechanical valve replacement, CHF, hypothyroidism, CKD, recent admission for infection and chest stimulator with removal and subsequent removal of drains who presents with lightheadedness, near syncope, and fall today.  According to patient, last 48 hours she has had significant fatigue, lightheadedness, and feeling lightheaded and tried to get to his bed.  He thinks he did hit his head but is not in significant pain.  He does report he has had a rough last 2 days due to this fatigue.  He does report some peripheral edema and is concerned about his legs.  He reports no chest pain palpitations or shortness of breath this time.  Denies any cough, congestion, nausea, vomiting, constipation, diarrhea, or urinary changes.  Denies any new cough.  Denies any headache or neck pain.  Denies any drainage or problems with his chest wounds.  On exam, lungs had some slight coarseness and rales but chest was nontender.  Abdomen nontender.  Good pulses in extremities.  Legs are edematous and bruised but otherwise not focally tender.  He has an abrasions on his knees but no focal tenderness.  Patient blood pressure is in the 90 systolic which he reports is not atypical for him.  His heart rate is 85 which is what it stays as he is paced.  EKG appears to show paced rhythm.  No STEMI.  Given the patient's fall and head strike on Coumadin will get CT head.  Will get other workup including labs to look for symptomatic anemia given his pale mucous membranes on exam.  His chest wall was nontender and I do not see purulence or drainage, doubt complication with his recent infection and wounds.  Will have her get chest x-ray and workup to look for occult infection.  Anticipate  reassessment after workup to determine disposition.  Care transferred to oncoming team to wait for results of diagnostic workup and reassessment.         Final Clinical Impression(s) / ED Diagnoses Final diagnoses:  Fall, initial encounter  Near syncope    Clinical Impression: 1. Fall, initial encounter   2. Near syncope     Disposition: Care transferred to oncoming team to wait for results of diagnostic workup and reassessment.  This note was prepared with assistance of Conservation officer, historic buildings. Occasional wrong-word or sound-a-like substitutions may have occurred due to the inherent limitations of voice recognition software.       Adreanna Fickel, Canary Brim, MD 05/04/23 [phone]

## 2023-05-04 NOTE — ED Notes (Signed)
[facility] called no ETA

## 2023-05-05 ENCOUNTER — Encounter: Payer: Self-pay | Admitting: Sports Medicine

## 2023-05-05 ENCOUNTER — Ambulatory Visit (HOSPITAL_COMMUNITY)
Admit: 2023-05-05 | Discharge: 2023-05-05 | Disposition: A | Discharge status: Home / Self Care | Payer: Medicare Other | Attending: Physician Assistant | Admitting: Physician Assistant

## 2023-05-05 ENCOUNTER — Encounter (HOSPITAL_COMMUNITY): Payer: Self-pay

## 2023-05-05 ENCOUNTER — Non-Acute Institutional Stay (SKILLED_NURSING_FACILITY): Payer: Medicare Other | Admitting: Sports Medicine

## 2023-05-05 VITALS — BP 106/70 | HR 85 | Wt 198.8 lb

## 2023-05-05 DIAGNOSIS — R296 Repeated falls: Secondary | ICD-10-CM | POA: Diagnosis not present

## 2023-05-05 DIAGNOSIS — I428 Other cardiomyopathies: Secondary | ICD-10-CM | POA: Diagnosis not present

## 2023-05-05 DIAGNOSIS — E039 Hypothyroidism, unspecified: Secondary | ICD-10-CM | POA: Diagnosis not present

## 2023-05-05 DIAGNOSIS — M7989 Other specified soft tissue disorders: Secondary | ICD-10-CM | POA: Diagnosis not present

## 2023-05-05 DIAGNOSIS — G4733 Obstructive sleep apnea (adult) (pediatric): Secondary | ICD-10-CM | POA: Diagnosis not present

## 2023-05-05 DIAGNOSIS — Z5181 Encounter for therapeutic drug level monitoring: Secondary | ICD-10-CM | POA: Diagnosis not present

## 2023-05-05 DIAGNOSIS — Z952 Presence of prosthetic heart valve: Secondary | ICD-10-CM | POA: Diagnosis not present

## 2023-05-05 DIAGNOSIS — I472 Ventricular tachycardia, unspecified: Secondary | ICD-10-CM

## 2023-05-05 DIAGNOSIS — Z79899 Other long term (current) drug therapy: Secondary | ICD-10-CM | POA: Diagnosis not present

## 2023-05-05 DIAGNOSIS — I442 Atrioventricular block, complete: Secondary | ICD-10-CM | POA: Insufficient documentation

## 2023-05-05 DIAGNOSIS — W19XXXA Unspecified fall, initial encounter: Secondary | ICD-10-CM | POA: Insufficient documentation

## 2023-05-05 DIAGNOSIS — I5082 Biventricular heart failure: Secondary | ICD-10-CM | POA: Insufficient documentation

## 2023-05-05 DIAGNOSIS — Z95810 Presence of automatic (implantable) cardiac defibrillator: Secondary | ICD-10-CM

## 2023-05-05 DIAGNOSIS — N1832 Chronic kidney disease, stage 3b: Secondary | ICD-10-CM

## 2023-05-05 DIAGNOSIS — I4719 Other supraventricular tachycardia: Secondary | ICD-10-CM | POA: Insufficient documentation

## 2023-05-05 DIAGNOSIS — I5023 Acute on chronic systolic (congestive) heart failure: Secondary | ICD-10-CM

## 2023-05-05 DIAGNOSIS — Z7901 Long term (current) use of anticoagulants: Secondary | ICD-10-CM | POA: Diagnosis not present

## 2023-05-05 DIAGNOSIS — I4891 Unspecified atrial fibrillation: Secondary | ICD-10-CM | POA: Diagnosis not present

## 2023-05-05 DIAGNOSIS — I480 Paroxysmal atrial fibrillation: Secondary | ICD-10-CM | POA: Diagnosis not present

## 2023-05-05 DIAGNOSIS — I4892 Unspecified atrial flutter: Secondary | ICD-10-CM | POA: Diagnosis not present

## 2023-05-05 DIAGNOSIS — N184 Chronic kidney disease, stage 4 (severe): Secondary | ICD-10-CM | POA: Diagnosis not present

## 2023-05-05 DIAGNOSIS — I97648 Postprocedural seroma of a circulatory system organ or structure following other circulatory system procedure: Secondary | ICD-10-CM | POA: Diagnosis not present

## 2023-05-05 LAB — BASIC METABOLIC PANEL
Anion gap: 11 (ref 5–15)
BUN: 26 mg/dL — ABNORMAL HIGH (ref 8–23)
CO2: 28 mmol/L (ref 22–32)
Calcium: 8.5 mg/dL — ABNORMAL LOW (ref 8.9–10.3)
Chloride: 98 mmol/L (ref 98–111)
Creatinine, Ser: 2.9 mg/dL — ABNORMAL HIGH (ref 0.61–1.24)
GFR, Estimated: 21 mL/min — ABNORMAL LOW (ref >60)
Glucose, Bld: 95 mg/dL (ref 70–99)
Potassium: 3.5 mmol/L (ref 3.5–5.1)
Sodium: 137 mmol/L (ref 135–145)

## 2023-05-05 LAB — AEROBIC/ANAEROBIC CULTURE W GRAM STAIN (SURGICAL/DEEP WOUND)

## 2023-05-05 LAB — I-STAT CG4 LACTIC ACID, ED: Lactic Acid, Venous: 1.9 mmol/L (ref 0.5–1.9)

## 2023-05-05 MED ORDER — AMIODARONE HCL 200 MG PO TABS: 400.0000 mg | ORAL_TABLET | Freq: Every day | ORAL | Status: DC

## 2023-05-05 MED ORDER — POTASSIUM CHLORIDE ER 10 MEQ PO TBCR: 20.0000 meq | EXTENDED_RELEASE_TABLET | Freq: Every day | ORAL | Status: DC

## 2023-05-05 MED ORDER — TORSEMIDE 20 MG PO TABS: 60.0000 mg | ORAL_TABLET | Freq: Two times a day (BID) | ORAL | Status: DC

## 2023-05-05 NOTE — Progress Notes (Signed)
Advanced Heart Failure Clinic Note     PCP: DR. Jacquenette Shone Primary Cardiologist: Dr. 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    Readmitted on 9/11 for ICD shocks for VT and a/c CHF w/ volume overload. He was placed on IV amio. Rates improved but was in and out of slow VT during admission. Paced out of VT 9/13 by EP. EP decreased pacing rate to 85 bpm. Had improvement and VT remained quiescent. Transitioned back to PO amiodarone 400 mg bid and quinidine.    AHF team consulted for volume management. Diuresis was c/b worsening renal fx/ AKI. He required inotropic support w/ milrinone which was eventually weaned off. Volume status improved as did renal fx. Off GDMT due to AKI/shock.    Once improved from a cardiac standpoint, he underwent Barostimulator device removal by [facility] on 9/17. Wound cx grew Pseudomonas. ID consulted and recommended 10 day course of Ciprofloxacin. JP drain kept in until his follow up with Vascular Surgery. He was discharged to [facility] on 04/21/23.   After discharge, the drain was removed. He developed reaccumulation of fluid in left chest wall and was readmitted on 10/04. Had another drain placed by IR.   Seen in ED yesterday with lightheadedness/near syncope and a fall. Felt dizzy walking back to his room from the dining room. Tried to lower himself onto the bed and slid down to the floor landing on his right shoulder and hitting his head. BP soft but near his baseline. CT head with no acute process, old infarcts. Renal function stable on labs (Scr 2.6). TSH and Free T4 elevated. K was 3.3, given 10 mEq KCL IV.  He is here today for hospital follow-up. He has been  very fatigued. Multiple falls over the last week.  Most falls have occurred when he tries to transfer to bed or out of bed. He recognizes he does not have the strength to do this independently. Gets lightheaded with position changes. He has been short of breath working with PT and notes lower extremity swelling. His weight today is 198 lb, he was 194 lb when we discharged him 09/27. No orthopnea or PND. Denies chest pain.   ROS: All systems negative except as listed in HPI, PMH and Problem List.  SH:  Social History   Socioeconomic History   Marital status: Married    Spouse name: Not on file   Number of children: 1   Years of education: 75   Highest education level: Master's degree (e.g., MA, MS, MEng, MEd, MSW, MBA)  Occupational History   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  Has one daughter.  Retired.  Education: Masters.   Social Determinants of Health   Financial Resource Strain: Low Risk  (03/08/2023)   Overall Physicist, medical Strain (  CARDIA)    Difficulty of Paying Living Expenses: Not hard at all  Food Insecurity: No Food Insecurity (04/29/2023)   Hunger Vital Sign    Worried About Running Out of Food in the Last Year: Never true    Ran Out of Food in the Last Year: Never true  Transportation Needs: No Transportation Needs (04/29/2023)   PRAPARE - Administrator, Civil Service (Medical): No    Lack of Transportation (Non-Medical): No  Physical Activity: Insufficiently Active (03/08/2023)   Exercise Vital Sign    Days of Exercise per Week: 4 days    Minutes of Exercise per Session: 30 min  Stress: No Stress  Concern Present (03/08/2023)   [employer] of Occupational Health - Occupational Stress Questionnaire    Feeling of Stress : Not at all  Social Connections: Moderately Integrated (03/08/2023)   Social Connection and Isolation Panel [NHANES]    Frequency of Communication with Friends and Family: More than three times a week    Frequency of Social Gatherings with Friends and Family: Twice a week    Attends Religious Services: More than 4 times per year    Active Member of [employer] or Organizations: No    Attends Banker Meetings: Never    Marital Status: Married  Catering manager Violence: Not At Risk (04/29/2023)   Humiliation, Afraid, Rape, and Kick questionnaire    Fear of Current or Ex-Partner: No    Emotionally Abused: No    Physically Abused: No    Sexually Abused: No    FH:  Family History  Problem Relation Age of Onset   Heart disease Mother    Heart failure Mother    Heart disease Father    Heart failure Father    Sleep apnea Neg Hx     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   Conductive hearing loss    uses hearing aids   Dysrhythmia    A-fib   History of blood transfusion 03/24/2023   Hypothyroidism    Intraspinal abscess    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   Nonischemic cardiomyopathy (HCC)    Psychosexual dysfunction with inhibited sexual excitement    S/P mitral valve replacement    Syncope and collapse    Unspecified sleep apnea    last sleep study 11/07, uses cpap   Ventricular tachycardia (HCC)    VT (ventricular tachycardia) (HCC)     Current Outpatient Medications  Medication Sig Dispense Refill    acetaminophen (TYLENOL) 500 MG tablet Take 1,000 mg by mouth every 8 (eight) hours as needed for headache, fever or mild pain.     enoxaparin (LOVENOX) 80 MG/0.8ML injection Inject 0.8 mLs (80 mg total) into the skin 2 (two) times daily. Discontinue once INR is therapeutic 48 mL 0   levothyroxine (SYNTHROID) 88 MCG tablet TAKE 1 TABLET BY MOUTH EVERY DAY 90 tablet 0   melatonin 5 MG TABS Take 5 mg by mouth at bedtime.     mirtazapine (REMERON) 7.5 MG tablet Take 1 tablet (7.5 mg total) by mouth at bedtime.     Multiple Vitamin (MULTIVITAMIN WITH MINERALS) TABS tablet Take 1 tablet  by mouth in the morning.     mupirocin ointment (BACTROBAN) 2 % Apply 1 Application topically daily. 30 g 2   nitroGLYCERIN (NITROSTAT) 0.4 MG SL tablet Place 1 tablet (0.4 mg total) under the tongue every 5 (five) minutes x 3 doses as needed for chest pain. 25 tablet 2   quiniDINE gluconate 324 MG CR tablet TAKE 1 TABLET BY MOUTH 2 TIMES DAILY. 180 tablet 3   warfarin (COUMADIN) 2.5 MG tablet Take 1 tablet (2.5 mg total) by mouth daily at 4 PM.     amiodarone (PACERONE) 200 MG tablet Take 2 tablets (400 mg total) by mouth daily.     potassium chloride (KLOR-CON) 10 MEQ tablet Take 2 tablets (20 mEq total) by mouth daily.     torsemide (DEMADEX) 20 MG tablet Take 3 tablets (60 mg total) by mouth 2 (two) times daily.     No current facility-administered medications for this encounter.    Vitals:   05/05/23 1044 05/05/23 1116  BP: 130/82 106/70  Pulse: 85   SpO2: 100%   Weight: 90.2 kg (198 lb 12.8 oz)     PHYSICAL EXAM: General:  Chronically ill appearing elderly male HEENT: normal Neck: supple. JVP to midneck.  Cor: PMI nondisplaced. Regular rate & rhythm. No rubs, gallops or murmurs. + drain Lungs: bibasilar rales Abdomen: soft, nontender, nondistended.  Extremities: no cyanosis, clubbing, rash, 2 + edema into thighs, + TED hose Neuro: alert & orientedx3. Affect pleasant    ECG: V paced 84  bpm  Medtronic device check:  No AT/AF or VT/VF since September, 99% V paced, OptiVol trending up since last week of September, < 1 hr activity daily   ASSESSMENT & PLAN:   Assessment/Plan  1. VT: History of VT and VT ablation in 2017.  MDT ICD.  Redo VT ablation in 1/23 (Dr. Marquette Saa).  He is on amiodarone, this was increased to 200 mg daily as outpatient with recurrent VT.  He has tolerated amiodarone poorly historically but is doing ok with it today.  In 7/23, slow VT was pace-terminated in the office, then he had an ICD shock for VT later in 7/23.  He was started on quinidine after this.  He is followed by Dr. Ladona Ridgel.  09/24 had 2 further ICD shocks for VT prior to admission, then had slow VT during hospitalization.  Paced out of VT 9/13 by EP.  At baseline, he RV paces most of the time. EP decreased pacing rate to 85 bpm.  - Decrease amiodarone to 400 mg daily. Recent TSH and Free T4 elevated. Will review with his provider at SNF and see if his synthroid needs to be adjusted - Continue quinidine.  2. Acute on chronic systolic CHF: Long-standing, suspected nonischemic cardiomyopathy.  Had RHC on 02/26/21 with normal filling pressures and mildly low cardiac output.  Echo in 4/23 showed  EF 20-25% with diffuse hypokinesis and septal-lateral dyssynchrony, mildly decreased RV systolic function, mechanical MV with normal function, dilated IVC.  He has a Medtronic CRT-D device present, but the LV lead is not functional.  Echo 09/24 with EF 20%, mechanical MV mean gradient 8 mmHg.  - NYHA IIIb. Volume overload on exam and by OptiVol. Increase Torsemide to 60 mg BID and add 20 mEq KCL daily. Check BMET today. - Off Spironolactone, valsartan, and Toprol XL with low SBP and CKD  - Off digoxin due to persistently elevated level.  - He did not tolerate SGLT2 inhibitor in the past.  -  The patient has CHB and has been chronically RV pacing as his LV lead is nonfunctional.  This is not ideal and creates  dyssynchrony with a very wide QRS.  Left bundle lead placement discussed in past with Dr. Ladona Ridgel but thought risk would be too high and left bundle lead was not attempted.   - Barostimulator device placed but complicated by chest wall hematoma as below and has been removed.  - He has advanced biventricular heart failure. Seems to be approaching end-stage. He is unable to tolerate GDMT d/t hypotension and renal impairment. If we cannot diurese him effectively, will need to consider GOC discussions. He is not an HD candidate. 3. Atrial fibrillation/flutter, atrial tachycardia: Paroxysmal.   - Continue amiodarone. See above. - Continue warfarin.  4. Complete heart block: Patient is pacer dependent due to CHB.  - See above, upgrade to functional CRT thought to be too risky due to need to extract old CS lead.  5. OSA: Severe. Continue CPAP 6. Mechanical mitral valve: Mean gradient higher on echo 09/24 at 8 mmHg.  - Continue warfarin INR goal 2.5-3.5.  7. Chest Wall Hematoma: post barostimulator complication, in setting of coumadin for mechanical MV.  s/p I&D for evacuation of hematoma and placement of wound vac + placement of antibiotic beads on 8/30. Barostimulator device removed 9/17. Wound cx with Pseudomonas.  - Treated with cipro per ID - Drain replaced 10/05.  - Follow-up with Vascular on 10/21 8. CKD stage IV: Creatinine baseline had been 1.5-2.  Recent AKI. Scr around 2.3-2.7 after recent admissions. - Not candidate for HD given age and end-stage HF. May be headed toward Palliative Care pathway - Labs today 9. Falls: Multiple falls over the last week. Possible syncopal episode yesterday. He is very weak and has dizziness with position changes. Explained to him that he should not attempt to transfer without assistance.     Follow-up: 1 week with APP to assess volume

## 2023-05-05 NOTE — Progress Notes (Addendum)
Provider:   Location:  [facility] Room Number: 23-A Place of Service:  SNF (31)  PCP: Pcp, No Patient Care Team: Pcp, No as PCP - General Marinus Maw, MD as PCP - Electrophysiology (Cardiology)  Extended Emergency Contact Information Primary Emergency Contact: [facility] Phone: [phone] Relation: Daughter Preferred language: English Secondary Emergency Contact: Raymondo Band Address: [address]          [place], [place] [address] [name] Home Phone: [phone] Mobile Phone: [phone] Relation: Spouse  Code Status: Full Code Goals of Care: Advanced Directive information    05/05/2023    9:34 AM  Advanced Directives  Does Patient Have a Medical Advance Directive? Yes  Type of Advance Directive Out of facility DNR (pink MOST or yellow form)  Does patient want to make changes to medical advance directive? No - Patient declined  Pre-existing out of facility DNR order (yellow form or pink MOST form) Pink MOST form placed in chart (order not valid for inpatient use)      Chief Complaint  Patient presents with   New Admit To SNF    HPI: Patient is a 82 y.o. male seen today for admission to Skilled care after recent hospitalization.  Pt seen and examined in his room  States that he felt dizzy when he was coming back to his room from dining hall, he lost balance and fell on the Rt shoulder hitting his head He went to ED yesterday had CT head, x ray which were unremarkable  Pt states he feels better today  He ate his breakfast  Denies headache, nausea, vomiting, blurring or double vision  Reports exertional SOB but no recent change  Denies chest pain, palpitations, abdominal pain, nausea, vomiting, dysuira, hematuria, bloody or dark stools. He has JP drain for left chest wall seroma, draining pink colored fluid Denies chest wall tenderness  He has appt with cardiology this morning   Hypokalemia     Latest  Ref Rng & Units 05/04/2023    3:16 PM 05/03/2023   12:00 AM 05/02/2023    4:57 AM  BMP  Glucose 70 - 99 mg/dL 962   98   BUN 8 - 23 mg/dL 30  29     27    Creatinine 0.61 - 1.24 mg/dL 9.52  2.6     8.41   Sodium 135 - 145 mmol/L 138  140     140   Potassium 3.5 - 5.1 mmol/L 3.3  3.5     3.3   Chloride 98 - 111 mmol/L 98  100     100   CO2 22 - 32 mmol/L 28  32     32   Calcium 8.9 - 10.3 mg/dL 8.3  8.2     8.2      This result is from an external source.   Pt was started on potassium supplements   Mechanical valve  Currently on lovenox , coumadin  Will check INR today   Orthostasis  Positive    As per d/c summary  Discharge Diagnoses:  Principal Problem:   Postprocedural seroma of skin and subcutaneous tissue following other procedure Active Problems:   Atrial fibrillation (HCC)   Hypothyroidism   H/O mitral valve replacement with mechanical valve   Chronic anticoagulation   Stage 3b chronic kidney disease (HCC)   OSA on CPAP   Postoperative seroma involving circulatory system after other circulatory system procedure     Postprocedural left chest  wall seroma after removal of Barostim device, POA -CT surgery and IR consulted, successful drain placement 10/5 with 150cc of brown non-bloody fluid - ok to resume anticoagulation. -Coumadin previously on hold - now restarted w/ lovenox bridge -INR 1.8   Chronic anemia of chronic disease (CKD4) GI bleed ruled out -Occult negative at intake -Questionable blood noted at facility, given transient nature likely hemorrhoid/similr -Follow labs, transfuse if symptomatic or less than 7 -Coumadin to resume as above   Hypokalemia -2.7 at intake, now 3.1 - continue to replete/follow   Chronic atrial fibrillation //chronic anticoagulation //history of mechanical MVR -Amiodarone ongoing, continue lovenox with bridge to Coumadin -INR 1.8 today   Stage 4 chronic kidney disease  -Stable   OSA -CPAP ordered    Hypothyroidism -Synthroid continued   HFrEF -Not in acute exacerbation, appears euvolemic, torsemide continued -Follow potassium as above may need chronic supplementation   Discharge Instructions   Discharge Instructions       Discharge patient   Complete by: As directed      Discharge disposition: 03-Skilled Nursing Facility    Discharge patient date: 05/02/2023         Allergies as of 05/02/2023   No Known Allergies         Medication List       STOP taking these medications     ciprofloxacin 500 MG tablet Commonly known as: CIPRO           TAKE these medications     acetaminophen 500 MG tablet Commonly known as: TYLENOL Take 1,000 mg by mouth every 8 (eight) hours as needed for headache, fever or mild pain.    amiodarone 200 MG tablet Commonly known as: PACERONE Take amio 400 mg twice a day    enoxaparin 80 MG/0.8ML injection Commonly known as: LOVENOX Inject 0.8 mLs (80 mg total) into the skin 2 (two) times daily. Discontinue once INR is therapeutic    levothyroxine 88 MCG tablet Commonly known as: SYNTHROID TAKE 1 TABLET BY MOUTH EVERY DAY    melatonin 5 MG Tabs Take 5 mg by mouth at bedtime.    mirtazapine 7.5 MG tablet Commonly known as: REMERON Take 1 tablet (7.5 mg total) by mouth at bedtime.    multivitamin with minerals Tabs tablet Take 1 tablet by mouth in the morning.    mupirocin ointment 2 % Commonly known as: BACTROBAN Apply 1 Application topically daily.    nitroGLYCERIN 0.4 MG SL tablet Commonly known as: NITROSTAT Place 1 tablet (0.4 mg total) under the tongue every 5 (five) minutes x 3 doses as needed for chest pain.    quiniDINE gluconate 324 MG CR tablet TAKE 1 TABLET BY MOUTH 2 TIMES DAILY.    torsemide 20 MG tablet Commonly known as: DEMADEX Take 2 tablets (40 mg total) by mouth 2 (two) times daily.    warfarin 2.5 MG tablet Commonly known as: COUMADIN Take as directed. If you are unsure how to take this  medication, talk to your nurse or doctor. Original instructions: Take 1 tablet (2.5 mg total) by mouth daily at 4 PM.            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  Conductive hearing loss    uses hearing aids   Dysrhythmia    A-fib   History of blood transfusion 03/24/2023   Hypothyroidism    Intraspinal abscess    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   Nonischemic cardiomyopathy (HCC)    Psychosexual dysfunction with inhibited sexual excitement    S/P mitral valve replacement    Syncope and collapse    Unspecified sleep apnea    last sleep study 11/07, uses cpap   Ventricular tachycardia (HCC)    VT (ventricular tachycardia) (HCC)    Past Surgical History:  Procedure Laterality Date   BIV ICD GENERATOR CHANGEOUT N/A 05/04/2017   Procedure: BiV ICD Generator Changeout;  Surgeon: Marinus Maw, MD;  Location: [facility] INVASIVE CV LAB;  Service: Cardiovascular;  Laterality: N/A;   CARDIAC CATHETERIZATION  04/24/2002   CARDIAC VALVE REPLACEMENT     CARDIOVERSION  03/09/2012   Procedure: CARDIOVERSION;  Surgeon: Marinus Maw, MD;  Location: [facility] ENDOSCOPY;  Service: Cardiovascular;  Laterality: N/A;   CARDIOVERSION N/A 11/22/2013   Procedure: CARDIOVERSION;  Surgeon: Thurmon Fair, MD;  Location: [facility] ENDOSCOPY;  Service: Cardiovascular;  Laterality: N/A;   dental implants     ELECTROPHYSIOLOGIC STUDY N/A 09/15/2015   Procedure: V Tach Ablation;  Surgeon: Marinus Maw, MD;  Location: [facility] INVASIVE CV LAB;  Service: Cardiovascular;  Laterality: N/A;   Evacution of epidural lumbar epidural abscess  07/26/1997   INCISION AND DRAINAGE OF WOUND Left  03/25/2023   Procedure: Washout of Chest Wall Hematoma;  Surgeon: Nada Libman, MD;  Location: [facility] OR;  Service: Vascular;  Laterality: Left;   INCISION AND DRAINAGE OF WOUND N/A 03/29/2023   Procedure: IRRIGATION AND DEBRIDEMENT  CHEST WALL;  Surgeon: Nada Libman, MD;  Location: [facility] OR;  Service: Vascular;  Laterality: N/A;   INSERT / REPLACE / REMOVE PACEMAKER  11/23/2008   IR US GUIDE BX ASP/DRAIN  04/30/2023   MITRAL VALVE REPLACEMENT     w #[id number] [facility]   PERMANENT PACEMAKER INSERTION N/A 08/05/2011   Procedure: PERMANENT PACEMAKER INSERTION;  Surgeon: Marinus Maw, MD;  Location: [facility] CATH LAB;  Service: Cardiovascular;  Laterality: N/A;   REMOVAL OF BAROSTIM N/A 04/12/2023   Procedure: REMOVAL OF [name];  Surgeon: Nada Libman, MD;  Location: [facility] OR;  Service: Vascular;  Laterality: N/A;   RIGHT HEART CATH N/A 02/26/2021   Procedure: RIGHT HEART CATH;  Surgeon: Laurey Morale, MD;  Location: [facility] INVASIVE CV LAB;  Service: Cardiovascular;  Laterality: N/A;   THYROIDECTOMY     TONSILLECTOMY     V-TACH ABLATION  07/30/2021   in [place]   VALVE REPLACEMENT  07/26/1998    reports that he has never smoked. He has never used smokeless tobacco. He reports that he does not currently use alcohol. He reports that he does not use drugs. Social History   Socioeconomic History   Marital status: Married    Spouse name: Not on file   Number of children: 1   Years of education: 89   Highest education level: Master's degree (e.g., MA, MS, MEng, MEd, MSW, MBA)  Occupational History   Occupation: retired from Consulting civil engineer  Tobacco Use   Smoking status: Never   Smokeless tobacco: Never  Vaping Use   Vaping status: Never Used  Substance and Sexual Activity   Alcohol use: Not Currently    Comment: occasionally   Drug use: No  Sexual activity: Not Currently  Other Topics Concern   Not on file  Social History Narrative   Lives with wife in a one story home.  Has one daughter.   Retired.  Education: Masters.   Social Determinants of Health   Financial Resource Strain: Low Risk  (03/08/2023)   Overall Financial Resource Strain (CARDIA)    Difficulty of Paying Living Expenses: Not hard at all  Food Insecurity: No Food Insecurity (04/29/2023)   Hunger Vital Sign    Worried About Running Out of Food in the Last Year: Never true    Ran Out of Food in the Last Year: Never true  Transportation Needs: No Transportation Needs (04/29/2023)   PRAPARE - Administrator, Civil Service (Medical): No    Lack of Transportation (Non-Medical): No  Physical Activity: Insufficiently Active (03/08/2023)   Exercise Vital Sign    Days of Exercise per Week: 4 days    Minutes of Exercise per Session: 30 min  Stress: No Stress Concern Present (03/08/2023)   [employer] of Occupational Health - Occupational Stress Questionnaire    Feeling of Stress : Not at all  Social Connections: Moderately Integrated (03/08/2023)   Social Connection and Isolation Panel [NHANES]    Frequency of Communication with Friends and Family: More than three times a week    Frequency of Social Gatherings with Friends and Family: Twice a week    Attends Religious Services: More than 4 times per year    Active Member of Clubs or Organizations: No    Attends Banker Meetings: Never    Marital Status: Married  Catering manager Violence: Not At Risk (04/29/2023)   Humiliation, Afraid, Rape, and Kick questionnaire    Fear of Current or Ex-Partner: No    Emotionally Abused: No    Physically Abused: No    Sexually Abused: No    Functional Status Survey:    Family History  Problem Relation Age of Onset   Heart disease Mother    Heart failure Mother    Heart disease Father    Heart failure Father    Sleep apnea Neg Hx     Health Maintenance  Topic Date Due   Zoster Vaccines- Shingrix (1 of 2) Never done   DTaP/Tdap/Td (1 - Tdap) 05/18/2013   COVID-19 Vaccine (4 - 2023-24  season) 03/27/2023   Medicare Annual Wellness (AWV)  03/07/2024   Pneumonia Vaccine 11+ Years old  Completed   INFLUENZA VACCINE  Completed   HPV VACCINES  Aged Out    No Known Allergies  Outpatient Encounter Medications as of 05/05/2023  Medication Sig   acetaminophen (TYLENOL) 500 MG tablet Take 1,000 mg by mouth every 8 (eight) hours as needed for headache, fever or mild pain.   amiodarone (PACERONE) 200 MG tablet Take amio 400 mg twice a day   enoxaparin (LOVENOX) 80 MG/0.8ML injection Inject 0.8 mLs (80 mg total) into the skin 2 (two) times daily. Discontinue once INR is therapeutic   levothyroxine (SYNTHROID) 88 MCG tablet TAKE 1 TABLET BY MOUTH EVERY DAY   melatonin 5 MG TABS Take 5 mg by mouth at bedtime.   mirtazapine (REMERON) 7.5 MG tablet Take 1 tablet (7.5 mg total) by mouth at bedtime.   Multiple Vitamin (MULTIVITAMIN WITH MINERALS) TABS tablet Take 1 tablet by mouth in the morning.   mupirocin ointment (BACTROBAN) 2 % Apply 1 Application topically daily.   nitroGLYCERIN (NITROSTAT) 0.4 MG SL tablet Place 1 tablet (0.4  mg total) under the tongue every 5 (five) minutes x 3 doses as needed for chest pain.   potassium chloride (KLOR-CON) 10 MEQ tablet Take 1 tablet (10 mEq total) by mouth daily for 7 days.   quiniDINE gluconate 324 MG CR tablet TAKE 1 TABLET BY MOUTH 2 TIMES DAILY.   torsemide (DEMADEX) 20 MG tablet Take 2 tablets (40 mg total) by mouth 2 (two) times daily.   warfarin (COUMADIN) 2.5 MG tablet Take 1 tablet (2.5 mg total) by mouth daily at 4 PM.   No facility-administered encounter medications on file as of 05/05/2023.    Review of Systems  Constitutional:  Negative for chills and fever.  HENT:  Negative for sinus pressure and sore throat.   Respiratory:  Positive for shortness of breath (exertional). Negative for cough and wheezing.   Cardiovascular:  Negative for chest pain, palpitations and leg swelling.  Gastrointestinal:  Negative for abdominal  distention, abdominal pain, blood in stool, constipation, diarrhea, nausea and vomiting.  Genitourinary:  Negative for dysuria, frequency and urgency.  Neurological:  Positive for dizziness. Negative for weakness and numbness.  Psychiatric/Behavioral:  Negative for confusion.     Vitals:   05/05/23 0916  BP: 127/79  Pulse: 84  Resp: 20  Temp: (!) 97.4 F (36.3 C)  SpO2: 97%  Weight: 193 lb 11.2 oz (87.9 kg)  Height: 6\' 1"  (1.854 m)   Body mass index is 25.56 kg/m. Physical Exam Constitutional:      Appearance: Normal appearance.  HENT:     Head: Normocephalic and atraumatic.  Cardiovascular:     Rate and Rhythm: Normal rate and regular rhythm.     Pulses: Normal pulses.     Heart sounds: Normal heart sounds.  Pulmonary:     Effort: No respiratory distress.     Breath sounds: No stridor. No wheezing or rales.  Abdominal:     General: Bowel sounds are normal. There is no distension.     Palpations: Abdomen is soft.     Tenderness: There is no abdominal tenderness. There is no right CVA tenderness or guarding.  Musculoskeletal:        General: Swelling (1+ pitting odeam) present.  Neurological:     Mental Status: He is alert. Mental status is at baseline.     Labs reviewed: Basic Metabolic Panel: Recent Labs    04/10/23 1405 04/11/23 0434 04/12/23 0429 04/12/23 0431 04/18/23 0306 04/19/23 0330 04/30/23 0400 05/01/23 0329 05/02/23 0457 05/03/23 0000 05/04/23 1516  NA 130*   < >  --    < > 130*   < > 139 142 140 140 138  K 4.0   < >  --    < > 3.4*   < > 2.7* 3.1* 3.3* 3.5 3.3*  CL 95*   < >  --    < > 91*   < > 95* 98 100 100 98  CO2 24   < >  --    < > 27   < > 29 29 32 32* 28  GLUCOSE 224*   < >  --    < > 166*   < > 87 90 98  --  105*  BUN 35*   < >  --    < > 48*   < > 33* 29* 27* 29* 30*  CREATININE 2.28*   < >  --    < > 3.88*   < > 2.66* 2.38* 2.24* 2.6* 2.65*  CALCIUM  8.0*   < >  --    < > 8.2*   < > 7.9* 8.3* 8.2* 8.2* 8.3*  MG  --    < > 2.2  --   2.4  --  2.1  --   --   --   --   PHOS 4.0  --   --   --   --   --   --   --   --   --   --    < > = values in this interval not displayed.   Liver Function Tests: Recent Labs    04/06/23 1030 04/10/23 1405 04/28/23 0000 04/29/23 1720 05/04/23 1516  AST 30  --  26 49* 33  ALT 24  --  23 26 27   ALKPHOS 78  --  89 83 103  BILITOT 1.4*  --   --  0.8 1.0  PROT 5.9*  --   --  5.3* 5.7*  ALBUMIN 3.2*   < > 3.1* 2.8* 3.0*   < > = values in this interval not displayed.   No results for input(s): "LIPASE", "AMYLASE" in the last 8760 hours. No results for input(s): "AMMONIA" in the last 8760 hours. CBC: Recent Labs    04/29/23 1542 04/30/23 0400 05/01/23 0329 05/02/23 0457 05/04/23 1750  WBC 11.7* 9.0 8.5 7.7 8.8  NEUTROABS 9.7* 7.3  --   --  7.0  HGB 9.3* 9.0* 8.6* 8.8* 9.2*  HCT 29.9* 28.7* 27.8* 28.0* 29.7*  MCV 99.0 98.6 95.9 100.0 99.3  PLT PLATELET CLUMPS NOTED ON SMEAR, COUNT APPEARS DECREASED 119* 113* 121* 133*   Cardiac Enzymes: No results for input(s): "CKTOTAL", "CKMB", "CKMBINDEX", "TROPONINI" in the last 8760 hours. BNP: Invalid input(s): "POCBNP" No results found for: "HGBA1C" Lab Results  Component Value Date   TSH 17.822 (H) 05/04/2023   Lab Results  Component Value Date   VITAMINB12 452 04/28/2023   Lab Results  Component Value Date   FOLATE 24.8 03/24/2023   Lab Results  Component Value Date   IRON 65 04/28/2023   TIBC 255 04/28/2023   FERRITIN 164 04/28/2023    Imaging and Procedures obtained prior to SNF admission: CT HEAD WO CONTRAST ( )  Result Date: 05/04/2023 CLINICAL DATA:  Polytrauma, blunt. Fall to the ground in the setting of near syncope and lightheadedness, on Coumadin for mechanical valve replacement. Recent thoracic chest procedure. EXAM: CT HEAD WITHOUT CONTRAST TECHNIQUE: Contiguous axial images were obtained from the base of the skull through the vertex without intravenous contrast. RADIATION DOSE REDUCTION: This exam was  performed according to the departmental dose-optimization program which includes automated exposure control, adjustment of the mA and/or kV according to patient size and/or use of iterative reconstruction technique. COMPARISON:  Head CT and MRI 05/23/2005 FINDINGS: Brain: There is no evidence of an acute infarct, intracranial hemorrhage, mass, midline shift, or extra-axial fluid collection. A small chronic right frontal cortical infarct appears larger than on the prior studies. There are new small infarcts in the left frontal lobe and right cerebellar hemisphere which are chronic in appearance. Hypodensities elsewhere in the cerebral white matter bilaterally have progressed and are nonspecific but compatible with mild chronic small vessel ischemic disease. There is mild cerebral atrophy. A cavum septum pellucidum et vergae is noted, a normal variant. Vascular: Calcified atherosclerosis at the skull base. No hyperdense vessel. Skull: No acute fracture or suspicious osseous lesion. Sinuses/Orbits: Visualized paranasal sinuses and mastoid air cells are clear. Unremarkable orbits. Other: None.  IMPRESSION: 1. No evidence of acute intracranial abnormality. 2. 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 Electronically Signed   By: Sebastian Ache M.D.   On: 05/04/2023 18:10   DG Chest 2 View  Result Date: 05/04/2023 CLINICAL DATA:  Near-syncope. Recent thoracic procedure. On Coumadin EXAM: CHEST - 2 VIEW COMPARISON:  X-ray 04/09/2023.  CT 04/29/2023 FINDINGS: Underinflation. Enlarged cardiopericardial silhouette with some vascular congestion. Question tiny effusions. There is some basilar atelectasis. No pneumothorax. Prosthetic cardiac valve. Right upper chest defibrillator with leads along the right side of the heart including the coronary sinus. Left upper chest wall pigtail catheter again seen with skin staples. IMPRESSION: Postop chest with enlarged heart, vascular congestion, small effusions and  defibrillator. No pneumothorax. Pigtail catheter overlying the left upper chest with skin staples. Underinflation. Electronically Signed   By: Karen Kays M.D.   On: 05/04/2023 17:26    Assessment/Plan  1. Atrial fibrillation, unspecified type (HCC) Rate controlled No signs of bleeding  2. Postoperative seroma involving circulatory system after other circulatory system procedure JP drain in place He has appt with cardiology today  No tenderness or erythema noted  3. OSA on CPAP Cont with cpap  4. Hypothyroidism, unspecified type Lab Results  Component Value Date   TSH 17.822 (H) 05/04/2023   Will repeat TSH in 4 weeks   5. Stage 3b chronic kidney disease (HCC) Lab Results  Component Value Date   CREATININE 3.64 (H) 05/08/2023   CREATININE 2.90 (H) 05/05/2023   CREATININE 2.65 (H) 05/04/2023   Avoid nephrotoxic meds  6. Anticoagulation goal of INR 2.5 to 3.5 On bridging with lovenox Will check INR ,if INR above 2.5 will stop lovenox Monitor for signs of bleeding  Hypokalemia Started on potassium supplements Will repeat K in a week   Family/ staff Communication:  care plan discussed with nursing staff   Labs/tests ordered: INR   I spent greater than  45 minutes for the care of this patient in face to face time, chart review, clinical documentation, patient education.

## 2023-05-05 NOTE — Patient Instructions (Addendum)
Medication Changes:  DECREASE AMIODARONE TO 400MG  ONCE DAILY   INCREASE TORSEMIDE TO 60MG  TWICE DAILY   INCREASE POTASSIUM TO ONCE DAILY   Lab Work:  Labs done today, your results will be available in MyChart, we will contact you for abnormal readings.  Follow-Up in: ON [facility] 10/14 AT 8:30  At the [facility], you and your health needs are our priority. We have a designated team specialized in the treatment of Heart Failure. This Care Team includes your primary Heart Failure Specialized Cardiologist (physician), Advanced Practice Providers (APPs- Physician Assistants and Nurse Practitioners), and Pharmacist who all work together to provide you with the care you need, when you need it.   You may see any of the following providers on your designated Care Team at your next follow up:  Dr. Arvilla Meres Dr. Marca Ancona Dr. Dorthula Nettles Dr. Theresia Bough Tonye Becket, NP Robbie Lis, [place] [facility] [place], [place] Brynda Peon, NP [place] Lee, NP Karle Plumber, PharmD   Please be sure to bring in all your medications bottles to every appointment.   Need to Contact us:  If you have any questions or concerns before your next appointment please send [place] a message through [place] or call our office at [phone].    TO LEAVE A MESSAGE FOR THE NURSE SELECT OPTION 2, PLEASE LEAVE A MESSAGE INCLUDING: YOUR NAME DATE OF BIRTH CALL BACK NUMBER REASON FOR CALL**this is important as we prioritize the call backs  YOU WILL RECEIVE A CALL BACK THE SAME DAY AS LONG AS YOU CALL BEFORE 4:00 PM

## 2023-05-05 NOTE — Progress Notes (Deleted)
Pt seen and examined in his room  States that he felt dizzy when he was coming back to his room from dining hall, he lost balance and fell on the Rt shoulder hitting his head He went to ED yesterday had CT head, x ray which were unremarkable  Pt states he feels better today  He ate his breakfast  Denies headache, nausea, vomiting, blurring or double vision  Reports exertional SOB but no recent change  Denies chest pain, palpitations, abdominal pain, nausea, vomiting, dysuira, hematuria, bloody or dark stools. He has JP drain for left chest wall seroma, draining pink colored fluid Denies chest wall tenderness  He has appt with cardiology this morning   Hypokalemia     Latest Ref Rng & Units 05/04/2023    3:16 PM 05/03/2023   12:00 AM 05/02/2023    4:57 AM  BMP  Glucose 70 - 99 mg/dL 161   98   BUN 8 - 23 mg/dL 30  29     27    Creatinine 0.61 - 1.24 mg/dL 0.96  2.6     0.45   Sodium 135 - 145 mmol/L 138  140     140   Potassium 3.5 - 5.1 mmol/L 3.3  3.5     3.3   Chloride 98 - 111 mmol/L 98  100     100   CO2 22 - 32 mmol/L 28  32     32   Calcium 8.9 - 10.3 mg/dL 8.3  8.2     8.2      This result is from an external source.   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  HENT:  Negative for congestion.   Respiratory:  Positive for shortness of breath.   Cardiovascular:  Positive for leg swelling. Negative for chest pain and palpitations.  Gastrointestinal:  Negative for abdominal pain, blood in stool, diarrhea, heartburn, nausea and vomiting.  Genitourinary:  Negative for dysuria and hematuria.    Physical Exam Constitutional:      Appearance: Normal appearance.  Cardiovascular:     Rate and Rhythm: Normal rate and regular rhythm.     Heart sounds: Murmur heard.  Pulmonary:     Effort: No respiratory  distress.     Breath sounds: Rales (lower 1/3  bilateral) present. No wheezing.  Abdominal:     General: Bowel sounds are normal. There is no distension.     Palpations: Abdomen is soft.     Tenderness: There is no abdominal tenderness. There is no guarding.  Musculoskeletal:        General: Swelling (1+ pitting odema) present.     Comments: Rt shoulder- no point tenderness Rom abduction limited to 90 degree  Neurological:     Mental Status: He is alert.

## 2023-05-06 ENCOUNTER — Encounter: Payer: Self-pay | Admitting: Sports Medicine

## 2023-05-06 ENCOUNTER — Other Ambulatory Visit: Payer: Self-pay

## 2023-05-06 ENCOUNTER — Non-Acute Institutional Stay (SKILLED_NURSING_FACILITY): Payer: Medicare Other | Admitting: Sports Medicine

## 2023-05-06 DIAGNOSIS — Z5181 Encounter for therapeutic drug level monitoring: Secondary | ICD-10-CM | POA: Diagnosis not present

## 2023-05-06 DIAGNOSIS — Z7901 Long term (current) use of anticoagulants: Secondary | ICD-10-CM

## 2023-05-06 DIAGNOSIS — R443 Hallucinations, unspecified: Secondary | ICD-10-CM | POA: Diagnosis not present

## 2023-05-06 DIAGNOSIS — R42 Dizziness and giddiness: Secondary | ICD-10-CM

## 2023-05-06 MED ORDER — WARFARIN SODIUM 2.5 MG PO TABS: ORAL_TABLET | ORAL | 1 refills | Status: DC

## 2023-05-06 NOTE — Progress Notes (Signed)
Location:   [facility]ing Home Room Number: [id number]-A Place of Service:  SNF (31) Provider:  Dr.Jaislyn Blinn Isaias Cowman PCP  Patient Care Team: Pcp, No as PCP - General Marinus Maw, MD as PCP - Electrophysiology (Cardiology)  Extended Emergency Contact Information Primary Emergency Contact: [name] Phone: [phone] Relation: Daughter Preferred language: English Secondary Emergency Contact: Raymondo Band Address: [address]          [place], [place] [address] [name] Home Phone: [phone] Mobile Phone: [phone] Relation: Spouse  Code Status:  FULL CODE Goals of care: Advanced Directive information    05/06/2023    3:41 PM  Advanced Directives  Does Patient Have a Medical Advance Directive? No  Does patient want to make changes to medical advance directive? No - Patient declined     Chief Complaint  Patient presents with   Acute Visit    Hallucinations     HPI:  Pt is a 82 y.o. male seen today for an acute visit for  hallucinations Staff reported that pt complained that he is seeing people when closing his eyes and do not see when he opens his eyes since yesterday.  He c/o feeling dizzy when standing up   Pt seen and examined in his room, he is alert and oriented x3  When asked him to close his eyes he states he is seeing people for less than few seconds and then do not see any people He states he has sleeping during the day  Currently working with the therapy, he is able to stand up and walk to the bathroom   He went to ED 2 days ago for a fall , had CT  IMPRESSION: 1. No evidence of acute intracranial abnormality. 2. 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   Conductive hearing loss    uses hearing aids   Dysrhythmia    A-fib   History of blood transfusion 03/24/2023   Hypothyroidism    Intraspinal abscess    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   Nonischemic cardiomyopathy (HCC)    Psychosexual dysfunction with inhibited sexual excitement    S/P mitral valve replacement    Syncope and collapse    Unspecified sleep apnea    last sleep study 11/07, uses cpap   Ventricular tachycardia (HCC)    VT (ventricular tachycardia) (HCC)    Past Surgical History:  Procedure Laterality Date   BIV ICD GENERATOR CHANGEOUT N/A 05/04/2017   Procedure: BiV ICD Generator Changeout;  Surgeon: Marinus Maw, MD;  Location: [facility] INVASIVE CV LAB;  Service: Cardiovascular;  Laterality: N/A;   CARDIAC CATHETERIZATION  04/24/2002   CARDIAC VALVE REPLACEMENT     CARDIOVERSION  03/09/2012   Procedure: CARDIOVERSION;  Surgeon: Marinus Maw, MD;  Location: [facility] ENDOSCOPY;  Service: Cardiovascular;  Laterality: N/A;   CARDIOVERSION N/A 11/22/2013   Procedure: CARDIOVERSION;  Surgeon: Thurmon Fair, MD;  Location: [facility] ENDOSCOPY;  Service: Cardiovascular;  Laterality: N/A;   dental implants  ELECTROPHYSIOLOGIC STUDY N/A 09/15/2015   Procedure: V Tach Ablation;  Surgeon: Marinus Maw, MD;  Location: [facility] INVASIVE CV LAB;  Service: Cardiovascular;  Laterality: N/A;   Evacution of epidural lumbar epidural abscess  07/26/1997   INCISION AND DRAINAGE OF WOUND Left 03/25/2023   Procedure: Washout of Chest Wall Hematoma;  Surgeon: Nada Libman, MD;  Location: [facility] OR;  Service: Vascular;  Laterality: Left;   INCISION AND DRAINAGE OF WOUND N/A 03/29/2023   Procedure: IRRIGATION AND DEBRIDEMENT  CHEST WALL;  Surgeon: Nada Libman, MD;  Location: [facility] OR;  Service:  Vascular;  Laterality: N/A;   INSERT / REPLACE / REMOVE PACEMAKER  11/23/2008   IR US GUIDE BX ASP/DRAIN  04/30/2023   MITRAL VALVE REPLACEMENT     w #[id number] [facility]   PERMANENT PACEMAKER INSERTION N/A 08/05/2011   Procedure: PERMANENT PACEMAKER INSERTION;  Surgeon: Marinus Maw, MD;  Location: [facility] CATH LAB;  Service: Cardiovascular;  Laterality: N/A;   REMOVAL OF BAROSTIM N/A 04/12/2023   Procedure: REMOVAL OF [name];  Surgeon: Nada Libman, MD;  Location: [facility] OR;  Service: Vascular;  Laterality: N/A;   RIGHT HEART CATH N/A 02/26/2021   Procedure: RIGHT HEART CATH;  Surgeon: Laurey Morale, MD;  Location: [facility] INVASIVE CV LAB;  Service: Cardiovascular;  Laterality: N/A;   THYROIDECTOMY     TONSILLECTOMY     V-TACH ABLATION  07/30/2021   in CE   VALVE REPLACEMENT  07/26/1998    No Known Allergies  Allergies as of 05/06/2023   No Known Allergies      Medication List        Accurate as of May 06, 2023  3:41 PM. If you have any questions, ask your nurse or doctor.          STOP taking these medications    potassium chloride 10 MEQ tablet Commonly known as: KLOR-CON Stopped by: Venita Sheffield       TAKE these medications    acetaminophen 500 MG tablet Commonly known as: TYLENOL Take 1,000 mg by mouth every 8 (eight) hours as needed for headache, fever or mild pain.   amiodarone 200 MG tablet Commonly known as: PACERONE Take 2 tablets (400 mg total) by mouth daily.   enoxaparin 80 MG/0.8ML injection Commonly known as: LOVENOX Inject 0.8 mLs (80 mg total) into the skin 2 (two) times daily. Discontinue once INR is therapeutic   levothyroxine 88 MCG tablet Commonly known as: SYNTHROID TAKE 1 TABLET BY MOUTH EVERY DAY   melatonin 5 MG Tabs Take 5 mg by mouth at bedtime.   mirtazapine 7.5 MG tablet Commonly known as: REMERON Take 1 tablet (7.5 mg total) by mouth at bedtime.   multivitamin with minerals Tabs tablet Take 1 tablet by mouth in the  morning.   mupirocin ointment 2 % Commonly known as: BACTROBAN Apply 1 Application topically daily.   nitroGLYCERIN 0.4 MG SL tablet Commonly known as: NITROSTAT Place 1 tablet (0.4 mg total) under the tongue every 5 (five) minutes x 3 doses as needed for chest pain.   potassium chloride SA 20 MEQ tablet Commonly known as: KLOR-CON M Take 20 mEq by mouth daily.   quiniDINE gluconate 324 MG CR tablet TAKE 1 TABLET BY MOUTH 2 TIMES DAILY.   torsemide 20 MG tablet Commonly known as: DEMADEX Take 3 tablets (60 mg total) by mouth 2 (two) times daily.   warfarin 2.5 MG tablet Commonly known as: COUMADIN Take as directed by  the anticoagulation clinic. If you are unsure how to take this medication, talk to your nurse or doctor. Original instructions: Take 1-2 tablets Daily or as prescribed by Coumadin Clinic. What changed:  how much to take how to take this when to take this additional instructions Changed by: Nurse Veverly Fells        Review of Systems  Constitutional:  Negative for chills and fever.  HENT:  Negative for sinus pressure and sore throat.   Respiratory:  Negative for cough, shortness of breath and wheezing.   Cardiovascular:  Positive for leg swelling. Negative for chest pain and palpitations.  Gastrointestinal:  Negative for abdominal distention, abdominal pain, blood in stool, constipation, diarrhea, nausea and vomiting.  Genitourinary:  Negative for dysuria, frequency and urgency.  Neurological:  Negative for dizziness, weakness and numbness.  Psychiatric/Behavioral:  Positive for hallucinations. Negative for confusion.     Immunization History  Administered Date(s) Administered   Fluad Quad(high Dose 65+) 05/17/2022   Influenza, High Dose Seasonal PF 05/11/2017, 04/18/2018, 05/04/2019, 04/18/2020, 04/27/2023   PFIZER(Purple Top)SARS-COV-2 Vaccination 08/14/2019, 09/03/2019, 05/09/2020   PNEUMOCOCCAL CONJUGATE-20 04/01/2023   PPD Test 04/23/2023    Pneumococcal Conjugate-13 01/17/2015   Pneumococcal Polysaccharide-23 01/08/2008   Td (Adult),unspecified 05/17/2013   Pertinent  Health Maintenance Due  Topic Date Due   INFLUENZA VACCINE  Completed      02/26/2021   10:56 AM 08/14/2021    4:08 PM 05/17/2022    9:50 AM 09/27/2022    3:33 PM 03/08/2023    2:19 PM  Fall Risk  Falls in the past year?   0 0 0  Was there an injury with Fall?   0 0 0  Fall Risk Category Calculator   0 0 0  Fall Risk Category (Retired)   Low    (RETIRED) Patient Fall Risk Level Moderate fall risk Low fall risk     Patient at Risk for Falls Due to    No Fall Risks No Fall Risks  Fall risk Follow up    Falls evaluation completed Falls prevention discussed;Falls evaluation completed   Functional Status Survey:    Vitals:   05/06/23 1536  BP: 127/79  Pulse: 84  Resp: 20  Temp: (!) 97.4 F (36.3 C)  SpO2: 97%  Weight: 193 lb 11.2 oz (87.9 kg)  Height: 6\' 1"  (1.854 m)   Body mass index is 25.56 kg/m. Physical Exam Constitutional:      Appearance: Normal appearance.  HENT:     Head: Normocephalic and atraumatic.  Cardiovascular:     Rate and Rhythm: Normal rate and regular rhythm.     Pulses: Normal pulses.     Heart sounds: Normal heart sounds.  Pulmonary:     Effort: No respiratory distress.     Breath sounds: No stridor. No wheezing or rales.  Abdominal:     General: Bowel sounds are normal. There is no distension.     Palpations: Abdomen is soft.     Tenderness: There is no abdominal tenderness. There is no right CVA tenderness or guarding.  Musculoskeletal:        General: Swelling present.  Neurological:     Mental Status: He is alert. Mental status is at baseline.     Labs reviewed: Recent Labs    04/10/23 1405 04/11/23 0434 04/12/23 0429 04/12/23 0431 04/18/23 0306 04/19/23 0330 04/30/23 0400 05/01/23 0329 05/02/23 0457 05/03/23 0000 05/04/23 1516 05/05/23 1222  NA 130*   < >  --    < >  130*   < > 139   < > 140 140  138 137  K 4.0   < >  --    < > 3.4*   < > 2.7*   < > 3.3* 3.5 3.3* 3.5  CL 95*   < >  --    < > 91*   < > 95*   < > 100 100 98 98  CO2 24   < >  --    < > 27   < > 29   < > 32 32* 28 28  GLUCOSE 224*   < >  --    < > 166*   < > 87   < > 98  --  105* 95  BUN 35*   < >  --    < > 48*   < > 33*   < > 27* 29* 30* 26*  CREATININE 2.28*   < >  --    < > 3.88*   < > 2.66*   < > 2.24* 2.6* 2.65* 2.90*  CALCIUM 8.0*   < >  --    < > 8.2*   < > 7.9*   < > 8.2* 8.2* 8.3* 8.5*  MG  --    < > 2.2  --  2.4  --  2.1  --   --   --   --   --   PHOS 4.0  --   --   --   --   --   --   --   --   --   --   --    < > = values in this interval not displayed.   Recent Labs    04/06/23 1030 04/10/23 1405 04/28/23 0000 04/29/23 1720 05/04/23 1516  AST 30  --  26 49* 33  ALT 24  --  23 26 27   ALKPHOS 78  --  89 83 103  BILITOT 1.4*  --   --  0.8 1.0  PROT 5.9*  --   --  5.3* 5.7*  ALBUMIN 3.2*   < > 3.1* 2.8* 3.0*   < > = values in this interval not displayed.   Recent Labs    04/29/23 1542 04/30/23 0400 05/01/23 0329 05/02/23 0457 05/04/23 1750  WBC 11.7* 9.0 8.5 7.7 8.8  NEUTROABS 9.7* 7.3  --   --  7.0  HGB 9.3* 9.0* 8.6* 8.8* 9.2*  HCT 29.9* 28.7* 27.8* 28.0* 29.7*  MCV 99.0 98.6 95.9 100.0 99.3  PLT PLATELET CLUMPS NOTED ON SMEAR, COUNT APPEARS DECREASED 119* 113* 121* 133*   Lab Results  Component Value Date   TSH 17.822 (H) 05/04/2023   No results found for: "HGBA1C" Lab Results  Component Value Date   CHOL 158 08/23/2015   HDL 50 08/23/2015   LDLCALC 82 08/23/2015   TRIG 130 08/23/2015   CHOLHDL 3.2 08/23/2015    Significant Diagnostic Results in last 30 days:  CT HEAD WO CONTRAST ( )  Result Date: 05/04/2023 CLINICAL DATA:  Polytrauma, blunt. Fall to the ground in the setting of near syncope and lightheadedness, on Coumadin for mechanical valve replacement. Recent thoracic chest procedure. EXAM: CT HEAD WITHOUT CONTRAST TECHNIQUE: Contiguous axial images were obtained from  the base of the skull through the vertex without intravenous contrast. RADIATION DOSE REDUCTION: This exam was performed according to the departmental dose-optimization program which includes automated exposure control, adjustment of the mA and/or kV according to patient  size and/or use of iterative reconstruction technique. COMPARISON:  Head CT and MRI 05/23/2005 FINDINGS: Brain: There is no evidence of an acute infarct, intracranial hemorrhage, mass, midline shift, or extra-axial fluid collection. A small chronic right frontal cortical infarct appears larger than on the prior studies. There are new small infarcts in the left frontal lobe and right cerebellar hemisphere which are chronic in appearance. Hypodensities elsewhere in the cerebral white matter bilaterally have progressed and are nonspecific but compatible with mild chronic small vessel ischemic disease. There is mild cerebral atrophy. A cavum septum pellucidum et vergae is noted, a normal variant. Vascular: Calcified atherosclerosis at the skull base. No hyperdense vessel. Skull: No acute fracture or suspicious osseous lesion. Sinuses/Orbits: Visualized paranasal sinuses and mastoid air cells are clear. Unremarkable orbits. Other: None. IMPRESSION: 1. No evidence of acute intracranial abnormality. 2. 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 Electronically Signed   By: Sebastian Ache M.D.   On: 05/04/2023 18:10   DG Chest 2 View  Result Date: 05/04/2023 CLINICAL DATA:  Near-syncope. Recent thoracic procedure. On Coumadin EXAM: CHEST - 2 VIEW COMPARISON:  X-ray 04/09/2023.  CT 04/29/2023 FINDINGS: Underinflation. Enlarged cardiopericardial silhouette with some vascular congestion. Question tiny effusions. There is some basilar atelectasis. No pneumothorax. Prosthetic cardiac valve. Right upper chest defibrillator with leads along the right side of the heart including the coronary sinus. Left upper chest wall pigtail catheter again seen with  skin staples. IMPRESSION: Postop chest with enlarged heart, vascular congestion, small effusions and defibrillator. No pneumothorax. Pigtail catheter overlying the left upper chest with skin staples. Underinflation. Electronically Signed   By: Karen Kays M.D.   On: 05/04/2023 17:26   IR US Guide Bx Asp/Drain  Result Date: 04/30/2023 INDICATION: 82 year old with history of a Barostim device removed from the left chest. Patient had a JP drain placed in surgery for a seroma. The drain has been dislodged. Patient has a recurrent superficial fluid collection in the left anterior chest. EXAM: ULTRASOUND-GUIDED PLACEMENT OF DRAINAGE CATHETER IN LEFT ANTERIOR CHEST WALL FLUID COLLECTION MEDICATIONS: 1% lidocaine for local anesthetic ANESTHESIA/SEDATION: None COMPLICATIONS: None immediate. PROCEDURE: Informed written consent was obtained from the patient after a thorough discussion of the procedural risks, benefits and alternatives. All questions were addressed. Maximal Sterile Barrier Technique was utilized including caps, mask, sterile gowns, sterile gloves, sterile drape, hand hygiene and skin antiseptic. A timeout was performed prior to the initiation of the procedure. Ultrasound demonstrated a large fluid collection in the left anterior chest. Left chest was prepped and draped in sterile fashion. Skin was anesthetized with 1% lidocaine along the lateral aspect of the chest. Small incision was made. Using ultrasound guidance, an 18 gauge trocar needle was directed into the fluid collection and brown fluid was aspirated. Superstiff Amplatz wire was placed. Tract was dilated to accommodate a 12 [place] multipurpose drain. Greater than 150 mL of fluid was initially removed. Drain was sutured to the skin and attached to a suction bulb. Dressing was placed. Ultrasound images were taken and saved for this procedure. FINDINGS: Large relatively simple fluid collection in the left anterior chest wall. Drain was confirmed  within the fluid collection using ultrasound guidance. Greater than 150 mL of brown fluid was removed. IMPRESSION: Ultrasound-guided placement of drainage catheter in the left anterior chest wall fluid collection. Electronically Signed   By: Richarda Overlie M.D.   On: 04/30/2023 19:18   CT Chest Wo Contrast  Result Date: 04/29/2023 CLINICAL DATA:  Postoperative swelling. Recent removal  of marrow stem device. JP drain removed 4 days ago. EXAM: CT CHEST WITHOUT CONTRAST TECHNIQUE: Multidetector CT imaging of the chest was performed following the standard protocol without IV contrast. RADIATION DOSE REDUCTION: This exam was performed according to the departmental dose-optimization program which includes automated exposure control, adjustment of the mA and/or kV according to patient size and/or use of iterative reconstruction technique. COMPARISON:  Radiograph 04/09/2023. FINDINGS: Cardiovascular: The heart is enlarged. Prosthetic mitral valve. Pacemaker in place. No pericardial effusion. Aortic atherosclerosis without aneurysm. No pericardial effusion. Mediastinum/Nodes: No mediastinal adenopathy. Assessment for hilar adenopathy is limited in the absence of IV contrast. No definite enlarged axillary nodes. Left lobe of the thyroid gland is diminutive or absent. Decompressed esophagus. Lungs/Pleura: Anterior left upper lobe opacity with air bronchogram is indeterminate for atelectasis or infection. Compressive atelectasis in the right lower lobe related to elevated hemidiaphragm. No pleural effusion or features of pulmonary edema. Upper Abdomen: Fatty atrophy of the pancreas. No acute upper abdominal findings. Musculoskeletal: Ill-defined fluid collection in the left anterior chest wall. Insert measurements are difficult due to irregular shape, however this measures approximately 13.8 x 4.6 x 10.4 cm, series 3, image 62 and series 5, image 101. This contains an air-fluid level medially. Adjacent strandy density and  ill-defined fluid anteriorly and tracking inferiorly. Mild bilateral gynecomastia. Skin staples in the anterior left upper chest wall at the superior aspect of fluid collection. There are no acute or suspicious osseous abnormalities. Bilateral gynecomastia. Chronic bilateral shoulder arthropathy. Prior median sternotomy. IMPRESSION: 1. Ill-defined fluid collection in the left anterior chest wall measuring approximately 13.8 x 4.6 x 10.4 cm. This contains an air-fluid level medially. This may represent a postoperative seroma, hematoma, or abscess. Sterility is indeterminate by imaging. 2. Anterior left upper lobe opacity with air bronchogram is indeterminate for atelectasis or infection. 3. Cardiomegaly. Aortic Atherosclerosis (ICD10-I70.0). Electronically Signed   By: Narda Rutherford M.D.   On: 04/29/2023 18:35   DG Chest Port 1 View  Result Date: 04/09/2023 CLINICAL DATA:  PICC line placement EXAM: PORTABLE CHEST 1 VIEW COMPARISON:  04/06/2023 FINDINGS: Single frontal view of the chest demonstrates interval placement of a left-sided PICC, tip overlying superior vena cava. 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 Stable multi lead AICD and left-sided nerve stimulator. Cardiac silhouette is unchanged. Continued central vascular congestion, with patchy bibasilar consolidation right greater than left increased since prior study. No effusion or pneumothorax. No acute bony abnormalities. IMPRESSION: 1. Left-sided PICC as above. 2. Continued central vascular congestion, with progressive bibasilar consolidation favoring atelectasis. Electronically Signed   By: Sharlet Salina M.D.   On: 04/09/2023 17:49   [place] EKG SITE RITE  Result Date: 04/09/2023 If Site Rite image not attached, placement could not be confirmed due to current cardiac rhythm.  ECHOCARDIOGRAM LIMITED  Result Date: 04/07/2023    ECHOCARDIOGRAM LIMITED REPORT   Patient Name:   Austin [name] Date of Exam:  04/07/2023 Medical Rec #:  [id number]              Height:       73.0 in Accession #:    [id number]             Weight:       200.8 lb Date of Birth:  1941/01/11               BSA:          2.155 m Patient Age:    77 years  BP:           102/92 mmHg Patient Gender: M                      HR:           108 bpm. 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 Cardiomyopathy and CHF, Defibrillator and                 Abnormal ECG, Arrythmias:Atrial Fibrillation, Atrial Flutter and                 VT; Signs/Symptoms:Dyspnea and Shortness of Breath.                  Mitral Valve: unknown size mechanical valve valve is present in                 the mitral position.  Sonographer:    Sheralyn Boatman RDCS Referring Phys: Dorthula Nettles  Sonographer Comments: Patient has wound vac in parasternal region. IMPRESSIONS  1. Left ventricular ejection fraction, by estimation, is 20%. The left ventricle has severely decreased function. The left ventricle demonstrates global hypokinesis. No LV thrombus noted. Indeterminant diastolic function.  2. Peak RV-RA gradient 19 mmHg. IVC not visualized. Right ventricular systolic function is mildly reduced. The right ventricular size is normal.  3. Mechanical mitral valve with mean gradient 8 mmHg, increased from prior. No mitral regurgitation noted though shadowing from prosthetic valve makes this difficult to discern.  4. Tricuspid valve regurgitation is moderate.  5. The aortic valve is tricuspid. There is moderate calcification of the aortic valve. Doppler evaluation of aortic valve not done.  6. Limited echo. FINDINGS  Left Ventricle: Left ventricular ejection fraction, by estimation, is 20%. The left ventricle has severely decreased function. The left ventricle demonstrates global  hypokinesis. Left ventricular diastolic parameters are indeterminate. Right Ventricle: Peak RV-RA gradient 19 mmHg. IVC not visualized. The right ventricular size is normal. No increase in right ventricular wall thickness. Right ventricular systolic function is mildly reduced. Mitral Valve: Mechanical mitral valve with mean gradient 8 mmHg, increased from prior. No mitral regurgitation noted though shadowing from prosthetic valve makes this difficult to discern. There is a unknown size mechanical valve present in the mitral position. MV peak gradient, 10.4 mmHg. The mean mitral valve gradient is 8.0 mmHg. Tricuspid Valve: The tricuspid valve is normal in structure. Tricuspid valve regurgitation is moderate. Aortic Valve: The aortic valve is tricuspid. There is moderate calcification of the aortic valve. Venous: The inferior vena cava was not well visualized. Additional Comments: A device lead is visualized in the right ventricle. Spectral Doppler performed. Color Doppler performed.  LEFT VENTRICLE PLAX 2D LVOT diam:     2.70 cm LV SV:         55 LV SV Index:   26 LVOT Area:     5.73 cm  LV Volumes (MOD) LV vol d, MOD A2C: 177.0 ml LV vol d, MOD A4C: 155.0 ml LV vol s, MOD A2C: 148.0 ml LV vol s, MOD A4C: 119.0 ml LV SV MOD A2C:     29.0 ml LV SV MOD A4C:     155.0 ml LV SV MOD BP:  35.4 ml AORTIC VALVE LVOT Vmax:   79.60 cm/s LVOT Vmean:  47.500 cm/s LVOT VTI:    0.096 m MITRAL VALVE              TRICUSPID VALVE MV Area VTI:  2.19 cm    TR Peak grad:   19.0 mmHg MV Peak grad: 10.4 mmHg   TR Vmax:        218.00 cm/s MV Mean grad: 8.0 mmHg MV Vmax:      1.62 m/s    SHUNTS MV Vmean:     110.9 cm/s  Systemic VTI:  0.10 m                           Systemic Diam: 2.70 cm Dalton McleanMD Electronically signed by Wilfred Lacy Signature Date/Time: 04/07/2023/2:14:27 PM    Final     Assessment/Plan  1. Hallucinations Pt reports seeing people when he closes his eyes for few seconds but then do not see them after he  opens his eyes  Will monitor neuro checks   2. Anticoagulation goal of INR 2.5 to 3.5 Monitor for signs of bleeding  Cont bridging with lovenox, coumadin    Dizziness Will check orthostatic vitals     Family/ staff Communication: care plan discussed with nursing staff  Labs/tests ordered:   INR

## 2023-05-08 ENCOUNTER — Emergency Department (HOSPITAL_COMMUNITY)
Admission: EM | Admit: 2023-05-08 | Discharge: 2023-05-09 | Disposition: A | Discharge status: Home / Self Care | Payer: Medicare Other | Source: Home / Self Care | Attending: Emergency Medicine | Admitting: Emergency Medicine

## 2023-05-08 ENCOUNTER — Encounter (HOSPITAL_COMMUNITY): Payer: Self-pay

## 2023-05-08 ENCOUNTER — Other Ambulatory Visit: Payer: Self-pay

## 2023-05-08 ENCOUNTER — Emergency Department (HOSPITAL_COMMUNITY): Payer: Medicare Other

## 2023-05-08 DIAGNOSIS — Z952 Presence of prosthetic heart valve: Secondary | ICD-10-CM | POA: Diagnosis not present

## 2023-05-08 DIAGNOSIS — Z471 Aftercare following joint replacement surgery: Secondary | ICD-10-CM | POA: Diagnosis not present

## 2023-05-08 DIAGNOSIS — Z743 Need for continuous supervision: Secondary | ICD-10-CM | POA: Diagnosis not present

## 2023-05-08 DIAGNOSIS — E871 Hypo-osmolality and hyponatremia: Secondary | ICD-10-CM | POA: Diagnosis not present

## 2023-05-08 DIAGNOSIS — R627 Adult failure to thrive: Secondary | ICD-10-CM | POA: Diagnosis not present

## 2023-05-08 DIAGNOSIS — Z8249 Family history of ischemic heart disease and other diseases of the circulatory system: Secondary | ICD-10-CM | POA: Diagnosis not present

## 2023-05-08 DIAGNOSIS — E8809 Other disorders of plasma-protein metabolism, not elsewhere classified: Secondary | ICD-10-CM | POA: Diagnosis not present

## 2023-05-08 DIAGNOSIS — H902 Conductive hearing loss, unspecified: Secondary | ICD-10-CM | POA: Diagnosis not present

## 2023-05-08 DIAGNOSIS — N179 Acute kidney failure, unspecified: Secondary | ICD-10-CM | POA: Diagnosis not present

## 2023-05-08 DIAGNOSIS — I442 Atrioventricular block, complete: Secondary | ICD-10-CM | POA: Diagnosis not present

## 2023-05-08 DIAGNOSIS — I4891 Unspecified atrial fibrillation: Secondary | ICD-10-CM | POA: Diagnosis not present

## 2023-05-08 DIAGNOSIS — Y838 Other surgical procedures as the cause of abnormal reaction of the patient, or of later complication, without mention of misadventure at the time of the procedure: Secondary | ICD-10-CM | POA: Insufficient documentation

## 2023-05-08 DIAGNOSIS — I4892 Unspecified atrial flutter: Secondary | ICD-10-CM | POA: Diagnosis not present

## 2023-05-08 DIAGNOSIS — I509 Heart failure, unspecified: Secondary | ICD-10-CM | POA: Diagnosis not present

## 2023-05-08 DIAGNOSIS — Z79899 Other long term (current) drug therapy: Secondary | ICD-10-CM | POA: Diagnosis not present

## 2023-05-08 DIAGNOSIS — T85838A Hemorrhage due to other internal prosthetic devices, implants and grafts, initial encounter: Secondary | ICD-10-CM | POA: Insufficient documentation

## 2023-05-08 DIAGNOSIS — I5082 Biventricular heart failure: Secondary | ICD-10-CM | POA: Diagnosis not present

## 2023-05-08 DIAGNOSIS — R58 Hemorrhage, not elsewhere classified: Secondary | ICD-10-CM | POA: Diagnosis not present

## 2023-05-08 DIAGNOSIS — Z95810 Presence of automatic (implantable) cardiac defibrillator: Secondary | ICD-10-CM | POA: Diagnosis not present

## 2023-05-08 DIAGNOSIS — R2681 Unsteadiness on feet: Secondary | ICD-10-CM | POA: Diagnosis not present

## 2023-05-08 DIAGNOSIS — E875 Hyperkalemia: Secondary | ICD-10-CM | POA: Diagnosis not present

## 2023-05-08 DIAGNOSIS — I495 Sick sinus syndrome: Secondary | ICD-10-CM | POA: Diagnosis not present

## 2023-05-08 DIAGNOSIS — D689 Coagulation defect, unspecified: Secondary | ICD-10-CM | POA: Diagnosis not present

## 2023-05-08 DIAGNOSIS — R0989 Other specified symptoms and signs involving the circulatory and respiratory systems: Secondary | ICD-10-CM | POA: Diagnosis not present

## 2023-05-08 DIAGNOSIS — I6782 Cerebral ischemia: Secondary | ICD-10-CM | POA: Diagnosis not present

## 2023-05-08 DIAGNOSIS — I517 Cardiomegaly: Secondary | ICD-10-CM | POA: Diagnosis not present

## 2023-05-08 DIAGNOSIS — I10 Essential (primary) hypertension: Secondary | ICD-10-CM | POA: Diagnosis not present

## 2023-05-08 DIAGNOSIS — I672 Cerebral atherosclerosis: Secondary | ICD-10-CM | POA: Diagnosis not present

## 2023-05-08 DIAGNOSIS — I428 Other cardiomyopathies: Secondary | ICD-10-CM | POA: Diagnosis not present

## 2023-05-08 DIAGNOSIS — R634 Abnormal weight loss: Secondary | ICD-10-CM | POA: Diagnosis not present

## 2023-05-08 DIAGNOSIS — R918 Other nonspecific abnormal finding of lung field: Secondary | ICD-10-CM | POA: Diagnosis not present

## 2023-05-08 DIAGNOSIS — T82838A Hemorrhage of vascular prosthetic devices, implants and grafts, initial encounter: Secondary | ICD-10-CM | POA: Diagnosis not present

## 2023-05-08 DIAGNOSIS — G4709 Other insomnia: Secondary | ICD-10-CM | POA: Diagnosis not present

## 2023-05-08 DIAGNOSIS — R296 Repeated falls: Secondary | ICD-10-CM | POA: Diagnosis not present

## 2023-05-08 DIAGNOSIS — I472 Ventricular tachycardia, unspecified: Secondary | ICD-10-CM | POA: Diagnosis not present

## 2023-05-08 DIAGNOSIS — I4719 Other supraventricular tachycardia: Secondary | ICD-10-CM | POA: Diagnosis not present

## 2023-05-08 DIAGNOSIS — I110 Hypertensive heart disease with heart failure: Secondary | ICD-10-CM | POA: Diagnosis not present

## 2023-05-08 DIAGNOSIS — I480 Paroxysmal atrial fibrillation: Secondary | ICD-10-CM | POA: Diagnosis not present

## 2023-05-08 DIAGNOSIS — E876 Hypokalemia: Secondary | ICD-10-CM | POA: Diagnosis not present

## 2023-05-08 DIAGNOSIS — D631 Anemia in chronic kidney disease: Secondary | ICD-10-CM | POA: Diagnosis not present

## 2023-05-08 DIAGNOSIS — S20212S Contusion of left front wall of thorax, sequela: Secondary | ICD-10-CM | POA: Diagnosis not present

## 2023-05-08 DIAGNOSIS — N189 Chronic kidney disease, unspecified: Secondary | ICD-10-CM | POA: Diagnosis not present

## 2023-05-08 DIAGNOSIS — I482 Chronic atrial fibrillation, unspecified: Secondary | ICD-10-CM | POA: Diagnosis not present

## 2023-05-08 DIAGNOSIS — I5022 Chronic systolic (congestive) heart failure: Secondary | ICD-10-CM | POA: Diagnosis not present

## 2023-05-08 DIAGNOSIS — E039 Hypothyroidism, unspecified: Secondary | ICD-10-CM | POA: Diagnosis not present

## 2023-05-08 DIAGNOSIS — I130 Hypertensive heart and chronic kidney disease with heart failure and stage 1 through stage 4 chronic kidney disease, or unspecified chronic kidney disease: Secondary | ICD-10-CM | POA: Diagnosis not present

## 2023-05-08 DIAGNOSIS — R0789 Other chest pain: Secondary | ICD-10-CM | POA: Insufficient documentation

## 2023-05-08 DIAGNOSIS — R531 Weakness: Secondary | ICD-10-CM | POA: Diagnosis not present

## 2023-05-08 DIAGNOSIS — R791 Abnormal coagulation profile: Secondary | ICD-10-CM | POA: Diagnosis not present

## 2023-05-08 DIAGNOSIS — J984 Other disorders of lung: Secondary | ICD-10-CM | POA: Diagnosis not present

## 2023-05-08 DIAGNOSIS — N184 Chronic kidney disease, stage 4 (severe): Secondary | ICD-10-CM | POA: Diagnosis not present

## 2023-05-08 DIAGNOSIS — I97648 Postprocedural seroma of a circulatory system organ or structure following other circulatory system procedure: Secondary | ICD-10-CM | POA: Diagnosis not present

## 2023-05-08 DIAGNOSIS — G4733 Obstructive sleep apnea (adult) (pediatric): Secondary | ICD-10-CM | POA: Diagnosis not present

## 2023-05-08 DIAGNOSIS — S299XXA Unspecified injury of thorax, initial encounter: Secondary | ICD-10-CM | POA: Diagnosis not present

## 2023-05-08 DIAGNOSIS — I5023 Acute on chronic systolic (congestive) heart failure: Secondary | ICD-10-CM | POA: Diagnosis not present

## 2023-05-08 DIAGNOSIS — Z7901 Long term (current) use of anticoagulants: Secondary | ICD-10-CM | POA: Diagnosis not present

## 2023-05-08 DIAGNOSIS — R6889 Other general symptoms and signs: Secondary | ICD-10-CM | POA: Diagnosis not present

## 2023-05-08 LAB — COMPREHENSIVE METABOLIC PANEL
ALT: 28 U/L (ref 0–44)
AST: 47 U/L — ABNORMAL HIGH (ref 15–41)
Albumin: 3.2 g/dL — ABNORMAL LOW (ref 3.5–5.0)
Alkaline Phosphatase: 107 U/L (ref 38–126)
Anion gap: 14 (ref 5–15)
BUN: 33 mg/dL — ABNORMAL HIGH (ref 8–23)
CO2: 25 mmol/L (ref 22–32)
Calcium: 8.7 mg/dL — ABNORMAL LOW (ref 8.9–10.3)
Chloride: 100 mmol/L (ref 98–111)
Creatinine, Ser: 3.64 mg/dL — ABNORMAL HIGH (ref 0.61–1.24)
GFR, Estimated: 16 mL/min — ABNORMAL LOW (ref >60)
Glucose, Bld: 126 mg/dL — ABNORMAL HIGH (ref 70–99)
Potassium: 4.3 mmol/L (ref 3.5–5.1)
Sodium: 139 mmol/L (ref 135–145)
Total Bilirubin: 1.3 mg/dL — ABNORMAL HIGH (ref 0.3–1.2)
Total Protein: 6.3 g/dL — ABNORMAL LOW (ref 6.5–8.1)

## 2023-05-08 LAB — PROTIME-INR
INR: 1.8 — ABNORMAL HIGH (ref 0.8–1.2)
Prothrombin Time: 20.9 s — ABNORMAL HIGH (ref 11.4–15.2)

## 2023-05-08 LAB — CBC
HCT: 31.1 % — ABNORMAL LOW (ref 39.0–52.0)
Hemoglobin: 9.4 g/dL — ABNORMAL LOW (ref 13.0–17.0)
MCH: 30.6 pg (ref 26.0–34.0)
MCHC: 30.2 g/dL (ref 30.0–36.0)
MCV: 101.3 fL — ABNORMAL HIGH (ref 80.0–100.0)
Platelets: 175 10*3/uL (ref 150–400)
RBC: 3.07 MIL/uL — ABNORMAL LOW (ref 4.22–5.81)
RDW: 18.9 % — ABNORMAL HIGH (ref 11.5–15.5)
WBC: 7.1 10*3/uL (ref 4.0–10.5)
nRBC: 0.3 % — ABNORMAL HIGH (ref 0.0–0.2)

## 2023-05-08 LAB — CBG MONITORING, ED: Glucose-Capillary: 110 mg/dL — ABNORMAL HIGH (ref 70–99)

## 2023-05-08 NOTE — Discharge Instructions (Addendum)
Patient's JP drain appears to be functioning as intended.  Austin [name] is having some leakage around the drain site.  Continue wound care.  Follow-up with the CT surgery team as discussed, follow-up with PCP.  Continue titrating anticoagulants per prior providers recommendation.  Daily dressing changes around the drain are to be expected.  No anemia or other emergency identified in the ER.

## 2023-05-08 NOTE — ED Notes (Addendum)
Attempted to reach [facility] at 2315 and 2325. No answer on either attempt.

## 2023-05-08 NOTE — ED Provider Notes (Addendum)
[facility] EMERGENCY DEPARTMENT AT [facility] Provider Note   CSN: [id number] Arrival date & time: 05/08/23  1941     History Chief Complaint  Patient presents with   Coagulation Disorder    HPI Marguis Mathieson is a 82 y.o. male presenting for chief complaint of bleeding from his JP drain. He has an extensive medical history including recent admission for chest wall hematoma from surgical intervention. He recently had to be readmitted and have a second drain placed due to recurrence of the hematoma.  He endorses frequent falls at his nursing facility but thinks the last time he fell was when he was here last.  Does not think he has fallen since his last discharge.  He states that his left chest tube site started bleeding earlier tonight.  Currently hemostatic.  He denies any symptoms though he does have substantial amount of blood on his clothes and chest currently being cleaned up by nursing.   Patient's recorded medical, surgical, social, medication list and allergies were reviewed in the Snapshot window as part of the initial history.   Review of Systems   Review of Systems  Constitutional:  Negative for chills and fever.  HENT:  Negative for ear pain and sore throat.   Eyes:  Negative for pain and visual disturbance.  Respiratory:  Negative for cough and shortness of breath.   Cardiovascular:  Negative for chest pain and palpitations.  Gastrointestinal:  Negative for abdominal pain and vomiting.  Genitourinary:  Negative for dysuria and hematuria.  Musculoskeletal:  Negative for arthralgias and back pain.  Skin:  Negative for color change and rash.  Neurological:  Negative for seizures and syncope.  All other systems reviewed and are negative.   Physical Exam Updated Vital Signs BP 107/77   Pulse 84   Temp 98.2 F (36.8 C)   Resp 17   Ht 6\' 1"  (1.854 m)   Wt 87.4 kg   SpO2 100%   BMI 25.42 kg/m  Physical Exam Vitals and nursing note reviewed.   Constitutional:      General: He is not in acute distress.    Appearance: He is well-developed.  HENT:     Head: Normocephalic and atraumatic.  Eyes:     Conjunctiva/sclera: Conjunctivae normal.  Cardiovascular:     Rate and Rhythm: Normal rate and regular rhythm.     Heart sounds: No murmur heard. Pulmonary:     Effort: Pulmonary effort is normal. No respiratory distress.     Breath sounds: Normal breath sounds.  Abdominal:     Palpations: Abdomen is soft.     Tenderness: There is no abdominal tenderness.  Musculoskeletal:        General: No swelling.     Cervical back: Neck supple.     Comments: Revealed his drain site.  It is clean dry intact at this time no further bleeding appreciated.  Dried dressing placed.  Skin:    General: Skin is warm and dry.     Capillary Refill: Capillary refill takes less than 2 seconds.  Neurological:     Mental Status: He is alert.  Psychiatric:        Mood and Affect: Mood normal.      ED Course/ Medical Decision Making/ A&P    Procedures Procedures   Medications Ordered in ED Medications - No data to display  Medical Decision Making:    Savan Ruta is a 82 y.o. male who presented to the ED today  with bleeding from the drain site detailed above.     Complete initial physical exam performed, notably the patient  was hemodynamically stable no acute distress.      Reviewed and confirmed nursing documentation for past medical history, family history, social history.    Initial Assessment:   This is most consistent with an acute life/limb threatening illness complicated by underlying chronic conditions. Now hemostatic fortunately.  Uncertain if this is secondary to coagulopathy (on Coumadin) or traumatic from his recent fall.  Given the amount of blood on his close, will perform H&H to evaluate for severity of symptoms.  Notably he is not tachycardic nor lightheaded so likely not clinically significant amount of hemoglobin  loss. Will perform a chest CT with contrast to evaluate for any active extravasation into his hematoma at this time as well as drain placement location to evaluate for dislocation. Will reassess for any ongoing bleeding after completion of the studies.  Will check his INR to see if he is coagulopathic  Initial Study Results:   Laboratory  All laboratory results reviewed without evidence of clinically relevant pathology.   Hemoglobin is at his baseline  EKG EKG was reviewed independently. Rate, rhythm, axis, intervals all examined and without medically relevant abnormality. ST segments without concerns for elevations.    Radiology  All images reviewed independently. Agree with radiology report at this time.   CT Chest Wo Contrast  Result Date: 05/08/2023 CLINICAL DATA:  Chest trauma EXAM: CT CHEST WITHOUT CONTRAST TECHNIQUE: Multidetector CT imaging of the chest was performed following the standard protocol without IV contrast. RADIATION DOSE REDUCTION: This exam was performed according to the departmental dose-optimization program which includes automated exposure control, adjustment of the mA and/or kV according to patient size and/or use of iterative reconstruction technique. COMPARISON:  Chest CT 04/29/2023 FINDINGS: Cardiovascular: The heart is moderately enlarged. The aorta is normal in size. There is no pericardial effusion. Right-sided ICD is present. 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 Right thyroid is within limits. No enlarged lymph nodes are seen. Esophagus is within normal limits. Lungs/Pleura: Small amount of airspace disease in the anterior left upper lobe appears similar to the prior study. There some ground-glass and interstitial opacities in both lung bases and atelectasis in the right lower lobe, similar to prior. There is no pneumothorax. Upper Abdomen: There is a suggestion of nodular liver contour. This is unchanged. Musculoskeletal: There is  bilateral gynecomastia. Percutaneous drainage catheter is seen in the anterior upper left chest wall. Previously identified fluid collection is no longer seen. There is soft tissue swelling and edema in this region in the subcutaneous tissues and within the muscular compartments. There some new small lymph nodes in this region as well. There is no evidence for soft tissue gas. Bilateral gynecomastia is again noted. No acute fractures are seen. There are degenerative changes of the shoulders. Sternotomy wires are present. IMPRESSION: 1. Percutaneous drainage catheter in the anterior upper left chest wall. Previously identified fluid collection is no longer seen. There is soft tissue swelling and edema in this region with new small lymph nodes. No soft tissue gas. Findings may be related to cellulitis. 2. Stable moderate cardiomegaly. 3. Stable bilateral lower lobe ground-glass and interstitial opacities, likely infectious/inflammatory. 4. Stable focal airspace disease in the left upper lobe, indeterminate for atelectasis or infection. Follow-up imaging recommended to confirm complete resolution. 5. Nodular liver contour may represent cirrhosis. Electronically Signed   By: Darliss Cheney M.D.   On:  05/08/2023 22:57     Reassessment and Plan:   On reassessment, his pressure dressing is dry without gross blood products, drain appears to be working.  Continue with outpatient care plan follow-up with CT surgery in the outpatient setting as discussed, bleeding mostly just around drain site.  Patient stable for outpatient care and management at this time. Notably, he has a cardiology appointment in 9 hours.  I believe patient can safely be discharged back to his facility since he is not having any large-volume bleeding at this time.  The dressing site can be reevaluated in the a.m. when he is brought to cardiology and if large-volume bleeding, he can be brought back to the emergency room for business hours consultation  with the specialty team otherwise, he is stable for his outpatient appointment with cardiothoracic surgery next week.    Disposition:  I have considered need for hospitalization, however, considering all of the above, I believe this patient is stable for discharge at this time.  Patient/family educated about specific return precautions for given chief complaint and symptoms.  Patient/family educated about follow-up with PCP.     Patient/family expressed understanding of return precautions and need for follow-up. Patient spoken to regarding all imaging and laboratory results and appropriate follow up for these results. All education provided in verbal form with additional information in written form. Time was allowed for answering of patient questions. Patient discharged.    Emergency Department Medication Summary:   Medications - No data to display       Clinical Impression:  1. Bleeding from Jackson-Pratt drain, initial encounter      Discharge   Final Clinical Impression(s) / ED Diagnoses Final diagnoses:  Bleeding from Jackson-Pratt drain, initial encounter    Rx / DC Orders ED Discharge Orders     None         Glyn Ade, MD 05/08/23 [id number]    Glyn Ade, MD 05/08/23 2314

## 2023-05-08 NOTE — ED Triage Notes (Signed)
Pt here from SNF w/ c/o Bleeding from chest tube to Lt chest wall. Pt s/p fall a week prior, pt currently taking Lovenox. Multiple skin tears noted.

## 2023-05-09 ENCOUNTER — Encounter: Payer: Self-pay | Admitting: Sports Medicine

## 2023-05-09 ENCOUNTER — Telehealth: Payer: Self-pay

## 2023-05-09 ENCOUNTER — Ambulatory Visit (HOSPITAL_BASED_OUTPATIENT_CLINIC_OR_DEPARTMENT_OTHER)
Admission: RE | Admit: 2023-05-09 | Discharge: 2023-05-09 | Disposition: A | Discharge status: Home / Self Care | Payer: Medicare Other | Source: Ambulatory Visit | Attending: Family Medicine | Admitting: Family Medicine

## 2023-05-09 ENCOUNTER — Ambulatory Visit (INDEPENDENT_AMBULATORY_CARE_PROVIDER_SITE_OTHER): Payer: Medicare Other

## 2023-05-09 ENCOUNTER — Encounter (HOSPITAL_COMMUNITY): Payer: Self-pay

## 2023-05-09 VITALS — BP 112/62 | HR 72 | Ht 73.0 in | Wt 169.6 lb

## 2023-05-09 DIAGNOSIS — I5023 Acute on chronic systolic (congestive) heart failure: Secondary | ICD-10-CM | POA: Diagnosis not present

## 2023-05-09 DIAGNOSIS — R296 Repeated falls: Secondary | ICD-10-CM

## 2023-05-09 DIAGNOSIS — Z952 Presence of prosthetic heart valve: Secondary | ICD-10-CM

## 2023-05-09 DIAGNOSIS — I5022 Chronic systolic (congestive) heart failure: Secondary | ICD-10-CM | POA: Diagnosis not present

## 2023-05-09 DIAGNOSIS — N184 Chronic kidney disease, stage 4 (severe): Secondary | ICD-10-CM

## 2023-05-09 DIAGNOSIS — I480 Paroxysmal atrial fibrillation: Secondary | ICD-10-CM

## 2023-05-09 DIAGNOSIS — G4733 Obstructive sleep apnea (adult) (pediatric): Secondary | ICD-10-CM

## 2023-05-09 DIAGNOSIS — S20212S Contusion of left front wall of thorax, sequela: Secondary | ICD-10-CM | POA: Diagnosis not present

## 2023-05-09 DIAGNOSIS — Z95810 Presence of automatic (implantable) cardiac defibrillator: Secondary | ICD-10-CM

## 2023-05-09 DIAGNOSIS — I472 Ventricular tachycardia, unspecified: Secondary | ICD-10-CM

## 2023-05-09 DIAGNOSIS — I442 Atrioventricular block, complete: Secondary | ICD-10-CM | POA: Diagnosis not present

## 2023-05-09 LAB — CULTURE, BLOOD (ROUTINE X 2)
Culture: NO GROWTH
Culture: NO GROWTH
Special Requests: ADEQUATE

## 2023-05-09 MED ORDER — POTASSIUM CHLORIDE CRYS ER 20 MEQ PO TBCR: 20.0000 meq | EXTENDED_RELEASE_TABLET | Freq: Two times a day (BID) | ORAL | 3 refills | Status: DC

## 2023-05-09 MED ORDER — TORSEMIDE 20 MG PO TABS: 80.0000 mg | ORAL_TABLET | Freq: Two times a day (BID) | ORAL | 3 refills | Status: DC

## 2023-05-09 NOTE — Telephone Encounter (Signed)
Molly a nurse at [facility] called [name] to let [name] know pt was having bleeding from around JP drain site in L chest area. Pt was seen in ED for this last night and sent back to his rehab facility. APP and I have both spoken to nurse who is with pt this morning and was with him yesterday when the site started to bleed. She states it is still bleeding bright red colored blood. She has been advised to hold continuous pressure for several minutes and apply a pressure dressing, and see if that helps. If not, she has been advised to send him to the ED. She verbalized understanding of the above and was in agreement.

## 2023-05-09 NOTE — Progress Notes (Signed)
Called Dr. Estanislado Spire office and spoke with Harriett Sine (nurse) per Prince Rome, NP. Updated nurse on continued bleeding from around JP drain in patients' left upper chest area. Informed nurse pt was seen in ED this am for same.   He has f/u scheduled with Dr. York Pellant office 05/16/23 but pt needs to be seen sooner if possible. Harriett Sine will discuss with providers and contact patient and/or his daughter and/or [facility] to schedule patient office visit sooner if possible.

## 2023-05-09 NOTE — Progress Notes (Signed)
This encounter was created in error - please disregard.

## 2023-05-09 NOTE — Patient Instructions (Signed)
INCREASE Torsemide to 80 mg Twice daily  INCREASE Potassium to 20 mEq ( 1 Tab) Twice daily  PLEASE TAKE A DOSE OF METOLAZONE 2.5 MG WITH OF POTASSIUM ( 2 TABS)  TODAY AS WELL AS YOUR TORSEMIDE.  Blood work in 10 days.  Your physician recommends that you schedule a follow-up appointment in: 5-6 weeks with Dr. Shirlee Latch.  If you have any questions or concerns before your next appointment please send [place] a message through [place] or call our office at [phone].    TO LEAVE A MESSAGE FOR THE NURSE SELECT OPTION 2, PLEASE LEAVE A MESSAGE INCLUDING: YOUR NAME DATE OF BIRTH CALL BACK NUMBER REASON FOR CALL**this is important as we prioritize the call backs  YOU WILL RECEIVE A CALL BACK THE SAME DAY AS LONG AS YOU CALL BEFORE 4:00 PM  At the [facility], you and your health needs are our priority. As part of our continuing mission to provide you with exceptional heart care, we have created designated Provider Care Teams. These Care Teams include your primary Cardiologist (physician) and Advanced Practice Providers (APPs- Physician Assistants and Nurse Practitioners) who all work together to provide you with the care you need, when you need it.   You may see any of the following providers on your designated Care Team at your next follow up: Dr Arvilla Meres Dr Marca Ancona Dr. Dorthula Nettles Dr. Clearnce Hasten Amy Filbert Schilder, NP Robbie Lis, [facility], [place] Brynda Peon, NP [place] Lee, NP Karle Plumber, PharmD   Please be sure to bring in all your medications bottles to every appointment.    Thank you for choosing [facility]

## 2023-05-09 NOTE — Progress Notes (Signed)
Advanced Heart Failure Clinic Note   PCP: Dr. Jacquenette Shone HF Cardiologist: Dr. Shirlee Latch  HPI: 82 y.o. with history of mechanical mitral valve s/p MV endocarditis, VT, nonischemic cardiomyopathy, complete heart block was referred by Dr. Katrinka Blazing for evaluation of CHF.  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 He subsequently developed a presumed nonischemic cardiomyopathy.  This was complicated by VT, he had a VT ablation in 2017 and had a Medtronic CRT-D device implanted. This had to be removed due to infection in 11/12 and was reimplanted in 1/13. Echo in 5/22 showed EF 30% with normal RV, mechanical MV stable with mean gradient 3 mmHg.  He has been in atrial fibrillation persistently since late 2021.  He is on amiodarone and has been cardioverted in the past. His LV lead is not functional.  He has complete heart block and is persistently RV pacing.    Had RHC 02/26/21 RA 4, PA 32/12, PCWP 11, CO 4.6, CI 2.2. Normal filling pressures and slightly low cardiac output.    Digoxin was stopped due to elevated level.    He saw Dr. Ladona Ridgel in 9/22 and was in slow VT in the office.  He was paced out of VT and amiodarone was increased to 200 mg daily.    He saw Dr. Ladona Ridgel, and placement of a left bundle or LV lead was decided to be too risky a procedure.  He was referred to Dr. Marquette Saa with the hope that he could have a VT ablation and get off amiodarone, which is likely playing a role in his symptoms.      S/p VT ablation with Dr. Marquette Saa 1/23.   Echo in 4/23 showed EF 20-25% with diffuse hypokinesis and septal-lateral dyssynchrony, mildly decreased RV systolic function, mechanical MV with normal function, dilated IVC.    He was seen in the office in 7/23, noted to be in atrial flutter in the atria and slow VT in the ventricles.  VT was pace terminated by Dr. Graciela Husbands and lower threshold set for ATP.  Later in 7/23, he had another VT episode and had an ICD discharge.   Quinidine was then started by Dr. Ladona Ridgel.    Follow up 10/23, had runs of VT terminated by ATP. Stable NYHA III and volume stable by OptiVol.   Echo 3/24 showed EF 20-25%, mildly decreased RV systolic function, moderate TR, IVC dilated, stable mechanical mitral valve with mean gradient 4 mmHg.    Follow up with EP 12/07/22, EKG showed atrial tach w/ ventricular pacing. Attempts were made to pace out but could not. Dr. Ladona Ridgel increased his ATP and turned off the VT therapy in the slow VT zone. Continued on amiodarone and quinidine.    Acute visit 5/24 with complaints of increased weakness, fatigue and near falls. In March, his PCP checked labs and noted that TSH was elevated at 10 and labs suggested anemia. Device interrogation showed euvolemia, frequent occurrences of AT/AF but of short durations. There was discussion of Barostim candidacy but his pro-BNP was > 3692. Concern for general deconditioning vs end-stage HF.  He subsequently saw [name] to look into barostimulation activation therapy, needs lower pro-BNP.    Recent barostim implant 8/24 c/b chest wall hematoma, s/p I&D w/ placement of wound vac.    Readmitted on 9/11 for ICD shocks for VT and a/c CHF w/ volume overload. He was placed on IV amio. Rates improved but was in and out of slow VT during  admission. Paced out of VT 9/13 by EP. EP decreased pacing rate to 85 bpm. Had improvement and VT remained quiescent. Transitioned back to PO amiodarone 400 mg bid and quinidine. AHF team consulted for volume management. Diuresis was c/b worsening renal fx/ AKI. He required inotropic support w/ milrinone which was eventually weaned off. Volume status improved as did renal fx. Off GDMT due to AKI/shock. Once improved from a cardiac standpoint, he underwent Barostimulator device removal by [facility] on 9/17. Wound cx grew Pseudomonas. ID consulted and recommended 10 day course of Ciprofloxacin. JP drain kept in until his follow up with Vascular Surgery. He  was discharged to [facility] on 04/21/23.   After discharge, the drain was removed. He developed reaccumulation of fluid in left chest wall and was readmitted on 10/04. Had another drain placed by IR.   Seen in ED 05/04/23 with lightheadedness/near syncope and a fall. Felt dizzy walking back to his room from the dining room. Tried to lower himself onto the bed and slid down to the floor landing on his right shoulder and hitting his head. BP soft but near his baseline. CT head with no acute process, old infarcts. Renal function stable on labs (Scr 2.6). TSH and Free T4 elevated. K was 3.3, given 10 mEq KCL IV.  Post hospital follow up 05/05/23, continued with multiple falls. NYHA IIIb and volume overloaded. Torsemide increased to 60 bid.   Today he returns for HF follow up. He remains at [facility] skilled nursing section. Seen in ED early this morning with bleeding from JP drain. Overall feeling poorly. Has significant muscle weakness, difficultly standing up. SOB with ADLs and dizziness. Weight is dropping despite appetite being OK. No swelling. Denies palpitations, CP,edema, or PND/Orthopnea. No fever or chills. Taking all medications provided by facility. No further falls.  ECG (personally reviewed from 05/08/23): AV paced with PVC  Device interrogation (personally reviewed): OptiVol up, thoracic impedence down, 99.9% VP, no AF, 1 hr/day activity, no VT  Labs (7/22): K 4.6, creatinine 1.3 Labs (8/22): K 4.4, creatinine 1.23 Labs (10/22): K 3.9, creatinine 1.36 Labs (12/22): K 4.2, creatinine 1.39 Labs (1/23): K 4.3, creatinine 1.15 Labs (2/23): TSH 10, K 4.1, creatinine 1.54 Labs (6/23): BNP 798, K 4.1, creatinine 1.3 Labs (7/23): BNP 1244, LFTs normal, K 4.2, creatinine 1.49 Labs (10/23): K 4, creatinine 1.72, TSH 5.7, transferrin saturation 28%, hgb 12.8, plts 128, LFTs normal Labs (3/24): K 4.9, creatinine 2.26, TSH 10.9, hgb 12.4 Labs (4/24): K 4.0, creatinine 2.88  Labs  (5/24): K 4.3, creatinine 2.33, TSH 7.9  Labs (6/24): K 4.1, creatinine 2.74 Labs (10/24): K 4.3, creatinine 3.64, hgb 9.4   PMH: 1. 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 2. H/o VT: VT ablation in 2017.  - Redo VT ablation in 1/23 with Dr. Marquette Saa - Arita Miss termination of VT in office in 7/23.  - ICD shock 7/23, started quinidine.  3. Atrial fibrillation/atypical flutter: Persistent since 2021.  Failed amiodarone.  4. PAD: Tibial occlusive disease.  5. 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  6. Hypothyroidism.  7. Chronic systolic CHF: Presumed nonischemic cardiomyopathy.  Medtronic CRT-D device, LV lead is nonfunctional.  - Echo (2012): EF 45% - Echo (1/17): EF 30-35% - Echo (6/21): EF 15-20% - Echo (11/21): EF 30-35%  - Echo (5/22): EF 30%, normal RV, mechanical mitral valve mean gradient 3 mmHg.  - RHC (8/22): RA mean 4, RV 33/4,PA 32/12 mean 21, PCWP mean 11, Oxygen saturations:PA 71%,  AO 98%, Cardiac Output (Fick) 4.66 Cardiac Index (Fick) 2.18 - Echo (4/23): EF 20-25% with diffuse hypokinesis and septal-lateral dyssynchrony, mildly decreased RV systolic function, mechanical MV with normal function, dilated IVC.  - Echo (3/24): EF 20-25%, mildly decreased RV systolic function, moderate TR, IVC dilated, stable mechanical mitral valve with mean gradient 4 mmHg.  - Ltd echo (9/24): EF 20%, RV systolic function mildly reduced, mechanical MV mean gradient 8 mmhg, moderate TR, no thrombus  ROS: All systems negative except as listed in HPI, PMH and Problem List.  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  Has one daughter.  Retired.  Education: Masters.   Social Determinants of Health   Financial Resource Strain: Low Risk  (03/08/2023)   Overall Financial Resource Strain (CARDIA)    Difficulty of Paying Living Expenses: Not hard at all  Food Insecurity: No Food Insecurity (04/29/2023)   Hunger Vital Sign    Worried About Running Out of Food in the Last Year: Never true    Ran Out of Food in the Last Year: Never true  Transportation Needs: No Transportation Needs (04/29/2023)   PRAPARE - Administrator, Civil Service (Medical): No    Lack of Transportation (Non-Medical): No  Physical Activity: Insufficiently Active (03/08/2023)   Exercise Vital Sign    Days of Exercise per Week: 4 days    Minutes of Exercise per Session: 30 min  Stress: No Stress Concern Present (03/08/2023)   [employer] of Occupational Health - Occupational Stress Questionnaire    Feeling of Stress : Not at all  Social Connections: Moderately Integrated (03/08/2023)   Social Connection and Isolation Panel [NHANES]    Frequency of Communication with Friends and Family: More than three times a week    Frequency of Social Gatherings with Friends and Family: Twice a week    Attends Religious Services: More than 4 times per year    Active Member of [employer] or Organizations: No    Attends Banker Meetings: Never    Marital Status: Married  Catering manager Violence: Not At Risk (04/29/2023)   Humiliation, Afraid, Rape, and Kick questionnaire    Fear of Current or Ex-Partner: No    Emotionally Abused: No    Physically Abused: No    Sexually Abused: No   FH:  Family History  Problem Relation Age of Onset   Heart disease Mother    Heart failure Mother     Heart disease Father    Heart failure Father    Sleep apnea Neg Hx     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    cx by infection, explantation11/12 &  reimplant 1/13   CHF (congestive heart failure) (HCC)    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   Conductive hearing loss    uses hearing aids   Dysrhythmia    A-fib   History of blood transfusion 03/24/2023   Hypothyroidism    Intraspinal abscess    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   Nonischemic cardiomyopathy (HCC)    Psychosexual dysfunction with inhibited sexual excitement    S/P mitral valve replacement    Syncope and collapse    Unspecified sleep apnea    last sleep study 11/07, uses cpap   Ventricular tachycardia (HCC)    VT (ventricular tachycardia) (HCC)     Current Outpatient Medications  Medication Sig Dispense Refill   acetaminophen (TYLENOL) 500 MG tablet Take 1,000 mg by mouth every 8 (eight) hours as needed for headache, fever or mild pain.     amiodarone (PACERONE) 200 MG tablet Take 2 tablets (400 mg total) by mouth daily.     enoxaparin (LOVENOX) 80 MG/0.8ML injection Inject 0.8 mLs (80 mg total) into the skin 2 (two) times daily. Discontinue once INR is therapeutic 48 mL 0   levothyroxine (SYNTHROID) 88 MCG tablet TAKE 1 TABLET BY MOUTH EVERY DAY 90 tablet 0   melatonin 5 MG TABS Take 5 mg by mouth at bedtime.     mirtazapine (REMERON) 7.5 MG tablet Take 1 tablet (7.5 mg total) by mouth at bedtime.     Multiple Vitamin (MULTIVITAMIN WITH MINERALS) TABS tablet Take 1 tablet by mouth in the morning.     mupirocin ointment (BACTROBAN) 2 % Apply 1 Application topically daily. 30 g 2   quiniDINE gluconate 324 MG CR tablet TAKE 1 TABLET BY MOUTH 2 TIMES DAILY. 180 tablet 3   warfarin (COUMADIN) 2.5 MG tablet Take 1-2  tablets Daily or as prescribed by Coumadin Clinic. 30 tablet 1   nitroGLYCERIN (NITROSTAT) 0.4 MG SL tablet Place 1 tablet (0.4 mg total) under the tongue every 5 (five) minutes x 3 doses as needed for chest pain. (Patient not taking: Reported on 05/09/2023) 25 tablet 2   potassium chloride SA (KLOR-CON M) 20 MEQ tablet Take 1 tablet (20 mEq total) by mouth 2 (two) times daily. 180 tablet 3   torsemide (DEMADEX) 20 MG tablet Take 4 tablets (80 mg total) by mouth 2 (two) times daily. 200 tablet 3   No current facility-administered medications for this encounter.   BP 112/62   Pulse 72   Ht 6\' 1"  (1.854 m)   Wt 76.9 kg (169 lb 9.6 oz)   SpO2 93%   BMI 22.38 kg/m   Wt Readings from Last 3 Encounters:  05/09/23 76.9 kg (169 lb 9.6 oz)  05/08/23 87.4 kg (192 lb 10.9 oz)  05/06/23 87.9 kg (193 lb 11.2 oz)   PHYSICAL EXAM: General:  NAD. No resp difficulty, chronically-ill appearing arrived in [facility] HEENT: Normal Neck: Supple. JVP 10. Carotids 2+ bilat; no bruits. No lymphadenopathy or thryomegaly appreciated. Cor: PMI nondisplaced. Regular rate & rhythm. No rubs, gallops, 2/6 SEM RUSB Lungs: Bibasilar crackles Abdomen: Soft, nontender, nondistended. No hepatosplenomegaly. No bruits or masses. Good bowel sounds. + JP drain with sanguinous drainage Extremities: No cyanosis, clubbing, rash; 2+ BLE edema, cool extremities, RUE + weeping. Multiple skin tears to extremities Neuro: Alert & oriented x 3, cranial nerves grossly intact. Moves all 4 extremities w/o difficulty. Affect pleasant.  ASSESSMENT & PLAN:  1. VT: History  of VT and VT ablation in 2017.  MDT ICD.  Redo VT ablation in 1/23 (Dr. Marquette Saa).  He is on amiodarone, this was increased to 200 mg daily as outpatient with recurrent VT.  He has tolerated amiodarone poorly historically. In 7/23, slow VT was pace-terminated in the office, then he had an ICD shock for VT later in 7/23.  He was started on quinidine after this.  He is followed by  Dr. Ladona Ridgel.  09/24 had 2 further ICD shocks for VT prior to admission, then had slow VT during hospitalization.  Paced out of VT 04/08/23 by EP.  At baseline, he RV paces most of the time. EP decreased pacing rate to 85 bpm.  - Continue amiodarone 400 mg daily. Recent TSH and free T4 elevated.  - Continue quinidine.  - He feels poorly, and attributes this to amiodarone. He does not have much options in the way of AAD. We discussed stopping amiodarone may likely result in increased VT. He has follow up with EP next week. Will defer to EP. 2. Acute on chronic systolic CHF: Long-standing, suspected nonischemic cardiomyopathy.  Had RHC on 02/26/21 with normal filling pressures and mildly low cardiac output.  Echo in 4/23 showed  EF 20-25% with diffuse hypokinesis and septal-lateral dyssynchrony, mildly decreased RV systolic function, mechanical MV with normal function, dilated IVC.  He has a Medtronic CRT-D device present, but the LV lead is not functional.  Echo 09/24 with EF 20%, mechanical MV mean gradient 8 mmHg. NYHA IIIb-IV, he has had a significant function decline over past year, more so since prolonged hospitalization. He is volume overload on exam and by OptiVol. GDMT and diuresis are further complicated by deteriorating renal function. - Increase torsemide to 80 mg bid, increase KCL to 20 bid. - Take metolazone 2.5 mg + 40 KCL x 1 today with morning dose of torsemide. Labs from ED visit yesterday reviewed, K 4.3, SCr 3.64. Will need BMET in 7-10 days. - Off spironolactone, valsartan, and Toprol XL with low SBP and CKD.  - Off digoxin due to persistently elevated level.  - He did not tolerate SGLT2 inhibitor in the past.  - The patient has CHB and has been chronically RV pacing as his LV lead is nonfunctional.  This is not ideal and creates dyssynchrony with a very wide QRS.  Left bundle lead placement discussed in past with Dr. Ladona Ridgel but thought risk would be too high and left bundle lead was not  attempted.   - Barostimulator device placed but complicated by chest wall hematoma as below, and has been removed.  - He has advanced biventricular heart failure. Now likely end-stage. He is unable to tolerate GDMT d/t hypotension and renal impairment. If we cannot diurese him effectively, will need to consider GOC discussions. He is not an HD candidate. 3. Atrial fibrillation/flutter, atrial tachycardia: Paroxysmal.   - Continue amiodarone. See above. - Continue warfarin.  4. Complete heart block: Patient is pacer dependent due to CHB.  - See above, upgrade to functional CRT thought to be too risky due to need to extract old CS lead.  5. OSA: Severe. Continue CPAP 6. Mechanical mitral valve: Mean gradient higher on echo 09/24 at 8 mmHg.  - Continue warfarin INR goal 2.5-3.5.  7. Chest Wall Hematoma: post barostimulator complication, in setting of coumadin for mechanical MV.  s/p I&D for evacuation of hematoma and placement of wound vac + placement of antibiotic beads on 03/25/23. Barostimulator device removed 9/17. Wound cx  with Pseudomonas.  - Treated with cipro per ID - Drain replaced 04/30/23.  - Follow-up with Vascular on 05/16/23, we redressed his drain dressing today, but continues to have seeping of sanguinous fluid. Will call [facility] to see if APP can see him sooner, hopefully to d/c drain. 8. CKD stage IV: Creatinine baseline had been 1.5-2.  Recent AKI. Scr around 2.3-2.7 after recent admissions. - Not candidate for HD given age and end-stage HF.  - Labs yesterday showed worsening renal function with SCr 3.64, as above will try to decongest, but suspect heading towards palliative.  9. Falls/FTT: Multiple falls. He is very weak and has dizziness with position changes. Explained to him that he should not attempt to transfer without assistance.  - ? Role of amiodarone here vs general decline. - As above, need to consider GOC discussions.  Follow up in 6 weeks with Dr. Shirlee Latch.   Prince Rome, FNP-BC 05/09/23  Greater than 50% of the (total minutes 40) visit spent in counseling/coordination of care regarding (JP drain management, GDMT titration, HF trajectory and considering GOC discussions).

## 2023-05-09 NOTE — ED Provider Notes (Signed)
I was asked to evaluate the patient prior to discharge.  Patient had some bleeding from the JP drain My exam bleeding has terminated.  The drain is still anchored into the skin He is in no acute distress Patient safe for discharge   Zadie Rhine, MD 05/09/23 0401

## 2023-05-09 NOTE — Progress Notes (Signed)
EPIC Encounter for ICM Monitoring  Patient Name: Austin [name] is a 82 y.o. male Date: 05/09/2023 Primary Care Physican: Laurey Morale, MD Primary Cardiologist: [place] Electrophysiologist: Ladona Ridgel 08/10/2022 Weight: 202 lbs 09/01/2022   Weight: 202 lbs 09/28/2022   Weight: 200 lbs 11/09/2022 Weight: 192.7 lbs 12/22/2022 Weight: 191 lbs 02/21/2023 Weight: 193.5 lbs 03/01/2023 Weight: 192 lbs    Since 21-Feb-2023 Time in AT/AF  0.0 hr/day (0.0%)           Pt currently residing in [facility].  He's had numerous ED visits and hospitalizations in Sept/Oct due to infection, ICD shocks and fluid overload.    Optivol Thoracic impedance suggesting possible fluid accumulation starting 9/28 and overload addressed today, 10/14 by Prince Rome, NP at HF clinic appointment.   Prescribed:  Furosemide 20 mg Take 4 tablets (80 mg total) by mouth twice a day.    Potassium 20 mEq take 1 tablet twice a day Spironolactone 25 mg take 0.5 tablet by mouth daily   Labs: 05/08/2023 Creatinine 3.64, BUN 33, Potassium 4.3, Sodium 139, GFR 16  05/05/2023 Creatinine 2.90, BUN 26, Potassium 3.5, Sodium 137, GFR 21  05/04/2023 Creatinine 2.65, BUN 30, Potassium 3.3, Sodium 138, GFR 24  05/03/2023 Creatinine 2.6, BUN 29, Potassium 3.5, Sodium 140 05/02/2023 Creatinine 2.24, BUN 27, Potassium 3.3, Sodium 140, GFR 29  A complete set of results can be found in Results Review.   Recommendations:   Recommendations given today, 10/14 at HF clinic OV.      Follow-up plan: ICM clinic phone appointment on 05/25/2023 to recheck fluid levels.   91 day device clinic remote transmission 03/23/2023.      EP/Cardiology Office Visits:  06/16/2023 with Dr Shirlee Latch.  05/18/2023 with Francis Dowse, PA.  06/01/2023 with Dr Ladona Ridgel.     Copy of ICM check sent to Dr. Ladona Ridgel.    3 month ICM trend: 05/09/2023.    12-14 Month ICM trend:     Karie Soda, RN 05/09/2023 4:08 PM

## 2023-05-10 ENCOUNTER — Encounter: Payer: Self-pay | Admitting: Adult Health

## 2023-05-10 ENCOUNTER — Non-Acute Institutional Stay (SKILLED_NURSING_FACILITY): Payer: Medicare Other | Admitting: Adult Health

## 2023-05-10 DIAGNOSIS — R791 Abnormal coagulation profile: Secondary | ICD-10-CM | POA: Diagnosis not present

## 2023-05-10 DIAGNOSIS — I4891 Unspecified atrial fibrillation: Secondary | ICD-10-CM

## 2023-05-10 NOTE — Progress Notes (Unsigned)
Location:  [facility] Room Number: 23-A Place of Service:  SNF (31) Provider:  Kenard Gower, DNP, FNP-BC  Patient Care Team: Laurey Morale, MD as PCP - General (Cardiology)  Extended Emergency Contact Information Primary Emergency Contact: [facility] Phone: [phone] Relation: Daughter Preferred language: English Secondary Emergency Contact: Raymondo Band Address: [address]          [place], [place] [address] [name] Home Phone: [phone] Mobile Phone: [phone] Relation: Spouse  Code Status:  Full Code  Goals of care: Advanced Directive information    05/10/2023    4:51 PM  Advanced Directives  Does Patient Have a Medical Advance Directive? Yes  Type of Advance Directive Out of facility DNR (pink MOST or yellow form)  Does patient want to make changes to medical advance directive? No - Patient declined  Pre-existing out of facility DNR order (yellow form or pink MOST form) Pink MOST form placed in chart (order not valid for inpatient use)     Chief Complaint  Patient presents with   Acute Visit    bleeding drain site and elevated INR    HPI:  Pt is a 82 y.o. male seen today for medical management of chronic diseases.  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   Conductive hearing loss    uses hearing aids   Dysrhythmia    A-fib   History of blood transfusion 03/24/2023   Hypothyroidism    Intraspinal abscess    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   Nonischemic cardiomyopathy (HCC)    Psychosexual dysfunction with inhibited sexual excitement    S/P mitral  valve replacement    Syncope and collapse    Unspecified sleep apnea    last sleep study 11/07, uses cpap   Ventricular tachycardia (HCC)    VT (ventricular tachycardia) (HCC)    Past Surgical History:  Procedure Laterality Date   BIV ICD GENERATOR CHANGEOUT N/A 05/04/2017   Procedure: BiV ICD Generator Changeout;  Surgeon: Marinus Maw, MD;  Location[facility] INVASIVE CV LAB;  Service: Cardiovascular;  Laterality: N/A;   CARDIAC CATHETERIZATION  04/24/2002   CARDIAC VALVE REPLACEMENT     CARDIOVERSION  03/09/2012   Procedure: CARDIOVERSION;  Surgeon: Marinus Maw, MD;  Location: [facility] ENDOSCOPY;  Service: Cardiovascular;  Laterality: N/A;   CARDIOVERSION N/A 11/22/2013   Procedure: CARDIOVERSION;  Surgeon: Thurmon Fair, MD;  Location: [facility] ENDOSCOPY;  Service: Cardiovascular;  Laterality: N/A;   dental implants     ELECTROPHYSIOLOGIC STUDY N/A 09/15/2015   Procedure: V Tach Ablation;  Surgeon: Marinus Maw, MD;  Location: [facility] INVASIVE CV LAB;  Service: Cardiovascular;  Laterality: N/A;   Evacution of epidural lumbar epidural abscess  07/26/1997   INCISION AND DRAINAGE OF WOUND Left 03/25/2023   Procedure: Washout of Chest Wall Hematoma;  Surgeon: Nada Libman, MD;  Location: [facility] OR;  Service: Vascular;  Laterality: Left;   INCISION AND DRAINAGE OF WOUND N/A 03/29/2023   Procedure: IRRIGATION AND DEBRIDEMENT  CHEST WALL;  Surgeon: Nada Libman, MD;  Location: [facility] OR;  Service: Vascular;  Laterality: N/A;   INSERT /  REPLACE / REMOVE PACEMAKER  11/23/2008   IR US GUIDE BX ASP/DRAIN  04/30/2023   MITRAL VALVE REPLACEMENT     w #[id number] [facility]   PERMANENT PACEMAKER INSERTION N/A 08/05/2011   Procedure: PERMANENT PACEMAKER INSERTION;  Surgeon: Marinus Maw, MD;  Location: [facility] CATH LAB;  Service: Cardiovascular;  Laterality: N/A;   REMOVAL OF BAROSTIM N/A 04/12/2023   Procedure: REMOVAL OF [name];  Surgeon: Nada Libman, MD;  Location: [facility] OR;  Service: Vascular;  Laterality: N/A;    RIGHT HEART CATH N/A 02/26/2021   Procedure: RIGHT HEART CATH;  Surgeon: Laurey Morale, MD;  Location: [facility] INVASIVE CV LAB;  Service: Cardiovascular;  Laterality: N/A;   THYROIDECTOMY     TONSILLECTOMY     V-TACH ABLATION  07/30/2021   in CE   VALVE REPLACEMENT  07/26/1998    No Known Allergies  Outpatient Encounter Medications as of 05/10/2023  Medication Sig   acetaminophen (TYLENOL) 500 MG tablet Take 1,000 mg by mouth every 8 (eight) hours as needed for headache, fever or mild pain.   amiodarone (PACERONE) 200 MG tablet Take 2 tablets (400 mg total) by mouth daily.   enoxaparin (LOVENOX) 80 MG/0.8ML injection Inject 0.8 mLs (80 mg total) into the skin 2 (two) times daily. Discontinue once INR is therapeutic   levothyroxine (SYNTHROID) 88 MCG tablet TAKE 1 TABLET BY MOUTH EVERY DAY   melatonin 5 MG TABS Take 5 mg by mouth at bedtime.   mirtazapine (REMERON) 7.5 MG tablet Take 1 tablet (7.5 mg total) by mouth at bedtime.   Multiple Vitamin (MULTIVITAMIN WITH MINERALS) TABS tablet Take 1 tablet by mouth in the morning.   mupirocin ointment (BACTROBAN) 2 % Apply 1 Application topically daily.   nitroGLYCERIN (NITROSTAT) 0.4 MG SL tablet Place 1 tablet (0.4 mg total) under the tongue every 5 (five) minutes x 3 doses as needed for chest pain.   potassium chloride SA (KLOR-CON M) 20 MEQ tablet Take 1 tablet (20 mEq total) by mouth 2 (two) times daily.   quiniDINE gluconate 324 MG CR tablet TAKE 1 TABLET BY MOUTH 2 TIMES DAILY.   torsemide (DEMADEX) 20 MG tablet Take 4 tablets (80 mg total) by mouth 2 (two) times daily.   warfarin (COUMADIN) 2.5 MG tablet Take 1-2 tablets Daily or as prescribed by Coumadin Clinic.   No facility-administered encounter medications on file as of 05/10/2023.    Review of Systems ***    Immunization History  Administered Date(s) Administered   Fluad Quad(high Dose 65+) 05/17/2022   Influenza, High Dose Seasonal PF 05/11/2017, 04/18/2018, 05/04/2019,  04/18/2020, 04/27/2023   PFIZER(Purple Top)SARS-COV-2 Vaccination 08/14/2019, 09/03/2019, 05/09/2020   PNEUMOCOCCAL CONJUGATE-20 04/01/2023   PPD Test 04/23/2023   Pneumococcal Conjugate-13 01/17/2015   Pneumococcal Polysaccharide-23 01/08/2008   Td (Adult),unspecified 05/17/2013   Pertinent  Health Maintenance Due  Topic Date Due   INFLUENZA VACCINE  Completed      02/26/2021   10:56 AM 08/14/2021    4:08 PM 05/17/2022    9:50 AM 09/27/2022    3:33 PM 03/08/2023    2:19 PM  Fall Risk  Falls in the past year?   0 0 0  Was there an injury with Fall?   0 0 0  Fall Risk Category Calculator   0 0 0  Fall Risk Category (Retired)   Low    (RETIRED) Patient Fall Risk Level Moderate fall risk Low fall risk     Patient at Risk for Falls  Due to    No Fall Risks No Fall Risks  Fall risk Follow up    Falls evaluation completed Falls prevention discussed;Falls evaluation completed     Vitals:   05/10/23 1557  BP: 126/68  Pulse: 83  Resp: 18  Temp: (!) 97.2 F (36.2 C)  SpO2: 97%  Weight: 193 lb 11.2 oz (87.9 kg)  Height: 6\' 1"  (1.854 m)   Body mass index is 25.56 kg/m.  Physical Exam     Labs reviewed: Recent Labs    04/10/23 1405 04/11/23 0434 04/12/23 0429 04/12/23 0431 04/18/23 0306 04/19/23 0330 04/30/23 0400 05/01/23 0329 05/04/23 1516 05/05/23 1222 05/08/23 2032  NA 130*   < >  --    < > 130*   < > 139   < > 138 137 139  K 4.0   < >  --    < > 3.4*   < > 2.7*   < > 3.3* 3.5 4.3  CL 95*   < >  --    < > 91*   < > 95*   < > 98 98 100  CO2 24   < >  --    < > 27   < > 29   < > 28 28 25   GLUCOSE 224*   < >  --    < > 166*   < > 87   < > 105* 95 126*  BUN 35*   < >  --    < > 48*   < > 33*   < > 30* 26* 33*  CREATININE 2.28*   < >  --    < > 3.88*   < > 2.66*   < > 2.65* 2.90* 3.64*  CALCIUM 8.0*   < >  --    < > 8.2*   < > 7.9*   < > 8.3* 8.5* 8.7*  MG  --    < > 2.2  --  2.4  --  2.1  --   --   --   --   PHOS 4.0  --   --   --   --   --   --   --   --   --   --     < > = values in this interval not displayed.   Recent Labs    04/29/23 1720 05/04/23 1516 05/08/23 2032  AST 49* 33 47*  ALT 26 27 28   ALKPHOS 83 103 107  BILITOT 0.8 1.0 1.3*  PROT 5.3* 5.7* 6.3*  ALBUMIN 2.8* 3.0* 3.2*   Recent Labs    04/29/23 1542 04/30/23 0400 05/01/23 0329 05/02/23 0457 05/04/23 1750 05/08/23 2032  WBC 11.7* 9.0   < > 7.7 8.8 7.1  NEUTROABS 9.7* 7.3  --   --  7.0  --   HGB 9.3* 9.0*   < > 8.8* 9.2* 9.4*  HCT 29.9* 28.7*   < > 28.0* 29.7* 31.1*  MCV 99.0 98.6   < > 100.0 99.3 101.3*  PLT PLATELET CLUMPS NOTED ON SMEAR, COUNT APPEARS DECREASED 119*   < > 121* 133* 175   < > = values in this interval not displayed.   Lab Results  Component Value Date   TSH 17.822 (H) 05/04/2023   No results found for: "HGBA1C" Lab Results  Component Value Date   CHOL 158 08/23/2015   HDL 50 08/23/2015   LDLCALC 82 08/23/2015   TRIG 130 08/23/2015  CHOLHDL 3.2 08/23/2015    Significant Diagnostic Results in last 30 days:  CT Chest Wo Contrast  Result Date: 05/08/2023 CLINICAL DATA:  Chest trauma EXAM: CT CHEST WITHOUT CONTRAST TECHNIQUE: Multidetector CT imaging of the chest was performed following the standard protocol without IV contrast. RADIATION DOSE REDUCTION: This exam was performed according to the departmental dose-optimization program which includes automated exposure control, adjustment of the mA and/or kV according to patient size and/or use of iterative reconstruction technique. COMPARISON:  Chest CT 04/29/2023 FINDINGS: Cardiovascular: The heart is moderately enlarged. The aorta is normal in size. There is no pericardial effusion. Right-sided ICD is present. 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 Right thyroid is within limits. No enlarged lymph nodes are seen. Esophagus is within normal limits. Lungs/Pleura: Small amount of airspace disease in the anterior left upper lobe appears similar to the prior study. There  some ground-glass and interstitial opacities in both lung bases and atelectasis in the right lower lobe, similar to prior. There is no pneumothorax. Upper Abdomen: There is a suggestion of nodular liver contour. This is unchanged. Musculoskeletal: There is bilateral gynecomastia. Percutaneous drainage catheter is seen in the anterior upper left chest wall. Previously identified fluid collection is no longer seen. There is soft tissue swelling and edema in this region in the subcutaneous tissues and within the muscular compartments. There some new small lymph nodes in this region as well. There is no evidence for soft tissue gas. Bilateral gynecomastia is again noted. No acute fractures are seen. There are degenerative changes of the shoulders. Sternotomy wires are present. IMPRESSION: 1. Percutaneous drainage catheter in the anterior upper left chest wall. Previously identified fluid collection is no longer seen. There is soft tissue swelling and edema in this region with new small lymph nodes. No soft tissue gas. Findings may be related to cellulitis. 2. Stable moderate cardiomegaly. 3. Stable bilateral lower lobe ground-glass and interstitial opacities, likely infectious/inflammatory. 4. Stable focal airspace disease in the left upper lobe, indeterminate for atelectasis or infection. Follow-up imaging recommended to confirm complete resolution. 5. Nodular liver contour may represent cirrhosis. Electronically Signed   By: Darliss Cheney M.D.   On: 05/08/2023 22:57   CT HEAD WO CONTRAST ( )  Result Date: 05/04/2023 CLINICAL DATA:  Polytrauma, blunt. Fall to the ground in the setting of near syncope and lightheadedness, on Coumadin for mechanical valve replacement. Recent thoracic chest procedure. EXAM: CT HEAD WITHOUT CONTRAST TECHNIQUE: Contiguous axial images were obtained from the base of the skull through the vertex without intravenous contrast. RADIATION DOSE REDUCTION: This exam was performed according to  the departmental dose-optimization program which includes automated exposure control, adjustment of the mA and/or kV according to patient size and/or use of iterative reconstruction technique. COMPARISON:  Head CT and MRI 05/23/2005 FINDINGS: Brain: There is no evidence of an acute infarct, intracranial hemorrhage, mass, midline shift, or extra-axial fluid collection. A small chronic right frontal cortical infarct appears larger than on the prior studies. There are new small infarcts in the left frontal lobe and right cerebellar hemisphere which are chronic in appearance. Hypodensities elsewhere in the cerebral white matter bilaterally have progressed and are nonspecific but compatible with mild chronic small vessel ischemic disease. There is mild cerebral atrophy. A cavum septum pellucidum et vergae is noted, a normal variant. Vascular: Calcified atherosclerosis at the skull base. No hyperdense vessel. Skull: No acute fracture or suspicious osseous lesion. Sinuses/Orbits: Visualized paranasal sinuses and mastoid air cells are clear. Unremarkable orbits.  Other: None. IMPRESSION: 1. No evidence of acute intracranial abnormality. 2. 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 Electronically Signed   By: Sebastian Ache M.D.   On: 05/04/2023 18:10   DG Chest 2 View  Result Date: 05/04/2023 CLINICAL DATA:  Near-syncope. Recent thoracic procedure. On Coumadin EXAM: CHEST - 2 VIEW COMPARISON:  X-ray 04/09/2023.  CT 04/29/2023 FINDINGS: Underinflation. Enlarged cardiopericardial silhouette with some vascular congestion. Question tiny effusions. There is some basilar atelectasis. No pneumothorax. Prosthetic cardiac valve. Right upper chest defibrillator with leads along the right side of the heart including the coronary sinus. Left upper chest wall pigtail catheter again seen with skin staples. IMPRESSION: Postop chest with enlarged heart, vascular congestion, small effusions and defibrillator. No pneumothorax.  Pigtail catheter overlying the left upper chest with skin staples. Underinflation. Electronically Signed   By: Karen Kays M.D.   On: 05/04/2023 17:26   IR US Guide Bx Asp/Drain  Result Date: 04/30/2023 INDICATION: 82 year old with history of a Barostim device removed from the left chest. Patient had a JP drain placed in surgery for a seroma. The drain has been dislodged. Patient has a recurrent superficial fluid collection in the left anterior chest. EXAM: ULTRASOUND-GUIDED PLACEMENT OF DRAINAGE CATHETER IN LEFT ANTERIOR CHEST WALL FLUID COLLECTION MEDICATIONS: 1% lidocaine for local anesthetic ANESTHESIA/SEDATION: None COMPLICATIONS: None immediate. PROCEDURE: Informed written consent was obtained from the patient after a thorough discussion of the procedural risks, benefits and alternatives. All questions were addressed. Maximal Sterile Barrier Technique was utilized including caps, mask, sterile gowns, sterile gloves, sterile drape, hand hygiene and skin antiseptic. A timeout was performed prior to the initiation of the procedure. Ultrasound demonstrated a large fluid collection in the left anterior chest. Left chest was prepped and draped in sterile fashion. Skin was anesthetized with 1% lidocaine along the lateral aspect of the chest. Small incision was made. Using ultrasound guidance, an 18 gauge trocar needle was directed into the fluid collection and brown fluid was aspirated. Superstiff Amplatz wire was placed. Tract was dilated to accommodate a 12 [place] multipurpose drain. Greater than 150 mL of fluid was initially removed. Drain was sutured to the skin and attached to a suction bulb. Dressing was placed. Ultrasound images were taken and saved for this procedure. FINDINGS: Large relatively simple fluid collection in the left anterior chest wall. Drain was confirmed within the fluid collection using ultrasound guidance. Greater than 150 mL of brown fluid was removed. IMPRESSION: Ultrasound-guided  placement of drainage catheter in the left anterior chest wall fluid collection. Electronically Signed   By: Richarda Overlie M.D.   On: 04/30/2023 19:18   CT Chest Wo Contrast  Result Date: 04/29/2023 CLINICAL DATA:  Postoperative swelling. Recent removal of marrow stem device. JP drain removed 4 days ago. EXAM: CT CHEST WITHOUT CONTRAST TECHNIQUE: Multidetector CT imaging of the chest was performed following the standard protocol without IV contrast. RADIATION DOSE REDUCTION: This exam was performed according to the departmental dose-optimization program which includes automated exposure control, adjustment of the mA and/or kV according to patient size and/or use of iterative reconstruction technique. COMPARISON:  Radiograph 04/09/2023. FINDINGS: Cardiovascular: The heart is enlarged. Prosthetic mitral valve. Pacemaker in place. No pericardial effusion. Aortic atherosclerosis without aneurysm. No pericardial effusion. Mediastinum/Nodes: No mediastinal adenopathy. Assessment for hilar adenopathy is limited in the absence of IV contrast. No definite enlarged axillary nodes. Left lobe of the thyroid gland is diminutive or absent. Decompressed esophagus. Lungs/Pleura: Anterior left upper lobe opacity with air bronchogram is  indeterminate for atelectasis or infection. Compressive atelectasis in the right lower lobe related to elevated hemidiaphragm. No pleural effusion or features of pulmonary edema. Upper Abdomen: Fatty atrophy of the pancreas. No acute upper abdominal findings. Musculoskeletal: Ill-defined fluid collection in the left anterior chest wall. Insert measurements are difficult due to irregular shape, however this measures approximately 13.8 x 4.6 x 10.4 cm, series 3, image 62 and series 5, image 101. This contains an air-fluid level medially. Adjacent strandy density and ill-defined fluid anteriorly and tracking inferiorly. Mild bilateral gynecomastia. Skin staples in the anterior left upper chest wall at  the superior aspect of fluid collection. There are no acute or suspicious osseous abnormalities. Bilateral gynecomastia. Chronic bilateral shoulder arthropathy. Prior median sternotomy. IMPRESSION: 1. Ill-defined fluid collection in the left anterior chest wall measuring approximately 13.8 x 4.6 x 10.4 cm. This contains an air-fluid level medially. This may represent a postoperative seroma, hematoma, or abscess. Sterility is indeterminate by imaging. 2. Anterior left upper lobe opacity with air bronchogram is indeterminate for atelectasis or infection. 3. Cardiomegaly. Aortic Atherosclerosis (ICD10-I70.0). Electronically Signed   By: Narda Rutherford M.D.   On: 04/29/2023 18:35    Assessment/Plan ***   Family/ staff Communication: Discussed plan of care with resident and charge nurse  Labs/tests ordered:     Kenard Gower, DNP, MSN, FNP-BC [facility] [phone] (Monday-Friday 8:00 a.m. - 5:00 p.m.) [phone] (after hours)

## 2023-05-11 ENCOUNTER — Other Ambulatory Visit: Payer: Self-pay

## 2023-05-11 ENCOUNTER — Encounter (HOSPITAL_COMMUNITY): Payer: Self-pay

## 2023-05-11 ENCOUNTER — Inpatient Hospital Stay (HOSPITAL_COMMUNITY)
Admission: EM | Admit: 2023-05-11 | Discharge: 2023-05-18 | DRG: 292 | Disposition: A | Discharge status: Skilled Nursing Facility | Payer: Medicare Other | Source: Skilled Nursing Facility | Attending: Internal Medicine | Admitting: Internal Medicine

## 2023-05-11 ENCOUNTER — Emergency Department (HOSPITAL_COMMUNITY): Payer: Medicare Other

## 2023-05-11 DIAGNOSIS — R791 Abnormal coagulation profile: Secondary | ICD-10-CM | POA: Diagnosis not present

## 2023-05-11 DIAGNOSIS — Z79890 Hormone replacement therapy (postmenopausal): Secondary | ICD-10-CM

## 2023-05-11 DIAGNOSIS — I472 Ventricular tachycardia, unspecified: Secondary | ICD-10-CM | POA: Diagnosis not present

## 2023-05-11 DIAGNOSIS — E039 Hypothyroidism, unspecified: Secondary | ICD-10-CM | POA: Diagnosis present

## 2023-05-11 DIAGNOSIS — R0902 Hypoxemia: Secondary | ICD-10-CM | POA: Diagnosis not present

## 2023-05-11 DIAGNOSIS — I495 Sick sinus syndrome: Secondary | ICD-10-CM | POA: Diagnosis present

## 2023-05-11 DIAGNOSIS — E871 Hypo-osmolality and hyponatremia: Secondary | ICD-10-CM | POA: Diagnosis present

## 2023-05-11 DIAGNOSIS — I493 Ventricular premature depolarization: Secondary | ICD-10-CM | POA: Diagnosis present

## 2023-05-11 DIAGNOSIS — T85838A Hemorrhage due to other internal prosthetic devices, implants and grafts, initial encounter: Secondary | ICD-10-CM | POA: Diagnosis present

## 2023-05-11 DIAGNOSIS — E8809 Other disorders of plasma-protein metabolism, not elsewhere classified: Secondary | ICD-10-CM | POA: Diagnosis present

## 2023-05-11 DIAGNOSIS — R001 Bradycardia, unspecified: Secondary | ICD-10-CM | POA: Diagnosis not present

## 2023-05-11 DIAGNOSIS — G4709 Other insomnia: Secondary | ICD-10-CM | POA: Diagnosis not present

## 2023-05-11 DIAGNOSIS — D631 Anemia in chronic kidney disease: Secondary | ICD-10-CM | POA: Diagnosis present

## 2023-05-11 DIAGNOSIS — R531 Weakness: Secondary | ICD-10-CM | POA: Diagnosis not present

## 2023-05-11 DIAGNOSIS — I5033 Acute on chronic diastolic (congestive) heart failure: Secondary | ICD-10-CM | POA: Diagnosis present

## 2023-05-11 DIAGNOSIS — Z952 Presence of prosthetic heart valve: Secondary | ICD-10-CM

## 2023-05-11 DIAGNOSIS — D638 Anemia in other chronic diseases classified elsewhere: Secondary | ICD-10-CM

## 2023-05-11 DIAGNOSIS — I5023 Acute on chronic systolic (congestive) heart failure: Secondary | ICD-10-CM | POA: Diagnosis not present

## 2023-05-11 DIAGNOSIS — I482 Chronic atrial fibrillation, unspecified: Secondary | ICD-10-CM | POA: Diagnosis not present

## 2023-05-11 DIAGNOSIS — Z7901 Long term (current) use of anticoagulants: Secondary | ICD-10-CM

## 2023-05-11 DIAGNOSIS — I428 Other cardiomyopathies: Secondary | ICD-10-CM | POA: Diagnosis present

## 2023-05-11 DIAGNOSIS — I517 Cardiomegaly: Secondary | ICD-10-CM | POA: Diagnosis not present

## 2023-05-11 DIAGNOSIS — D689 Coagulation defect, unspecified: Secondary | ICD-10-CM | POA: Diagnosis present

## 2023-05-11 DIAGNOSIS — N184 Chronic kidney disease, stage 4 (severe): Secondary | ICD-10-CM | POA: Diagnosis present

## 2023-05-11 DIAGNOSIS — Z95810 Presence of automatic (implantable) cardiac defibrillator: Secondary | ICD-10-CM | POA: Diagnosis not present

## 2023-05-11 DIAGNOSIS — I5082 Biventricular heart failure: Secondary | ICD-10-CM | POA: Diagnosis present

## 2023-05-11 DIAGNOSIS — H902 Conductive hearing loss, unspecified: Secondary | ICD-10-CM | POA: Diagnosis present

## 2023-05-11 DIAGNOSIS — Z8249 Family history of ischemic heart disease and other diseases of the circulatory system: Secondary | ICD-10-CM

## 2023-05-11 DIAGNOSIS — I499 Cardiac arrhythmia, unspecified: Secondary | ICD-10-CM | POA: Diagnosis not present

## 2023-05-11 DIAGNOSIS — Z8661 Personal history of infections of the central nervous system: Secondary | ICD-10-CM

## 2023-05-11 DIAGNOSIS — N189 Chronic kidney disease, unspecified: Secondary | ICD-10-CM | POA: Diagnosis not present

## 2023-05-11 DIAGNOSIS — E875 Hyperkalemia: Secondary | ICD-10-CM | POA: Diagnosis present

## 2023-05-11 DIAGNOSIS — R634 Abnormal weight loss: Secondary | ICD-10-CM | POA: Diagnosis present

## 2023-05-11 DIAGNOSIS — Y838 Other surgical procedures as the cause of abnormal reaction of the patient, or of later complication, without mention of misadventure at the time of the procedure: Secondary | ICD-10-CM | POA: Diagnosis present

## 2023-05-11 DIAGNOSIS — R58 Hemorrhage, not elsewhere classified: Secondary | ICD-10-CM | POA: Diagnosis not present

## 2023-05-11 DIAGNOSIS — Z79899 Other long term (current) drug therapy: Secondary | ICD-10-CM

## 2023-05-11 DIAGNOSIS — N179 Acute kidney failure, unspecified: Secondary | ICD-10-CM | POA: Diagnosis present

## 2023-05-11 DIAGNOSIS — R0789 Other chest pain: Secondary | ICD-10-CM | POA: Diagnosis present

## 2023-05-11 DIAGNOSIS — I4891 Unspecified atrial fibrillation: Secondary | ICD-10-CM | POA: Diagnosis not present

## 2023-05-11 DIAGNOSIS — I97648 Postprocedural seroma of a circulatory system organ or structure following other circulatory system procedure: Secondary | ICD-10-CM | POA: Diagnosis not present

## 2023-05-11 DIAGNOSIS — I509 Heart failure, unspecified: Principal | ICD-10-CM

## 2023-05-11 DIAGNOSIS — E876 Hypokalemia: Secondary | ICD-10-CM | POA: Diagnosis not present

## 2023-05-11 DIAGNOSIS — I110 Hypertensive heart disease with heart failure: Secondary | ICD-10-CM | POA: Diagnosis not present

## 2023-05-11 DIAGNOSIS — G4733 Obstructive sleep apnea (adult) (pediatric): Secondary | ICD-10-CM | POA: Diagnosis not present

## 2023-05-11 DIAGNOSIS — I4892 Unspecified atrial flutter: Secondary | ICD-10-CM | POA: Diagnosis present

## 2023-05-11 DIAGNOSIS — I4719 Other supraventricular tachycardia: Secondary | ICD-10-CM | POA: Diagnosis present

## 2023-05-11 DIAGNOSIS — R296 Repeated falls: Secondary | ICD-10-CM | POA: Diagnosis not present

## 2023-05-11 DIAGNOSIS — R627 Adult failure to thrive: Secondary | ICD-10-CM | POA: Diagnosis present

## 2023-05-11 DIAGNOSIS — R0989 Other specified symptoms and signs involving the circulatory and respiratory systems: Secondary | ICD-10-CM | POA: Diagnosis not present

## 2023-05-11 DIAGNOSIS — M79604 Pain in right leg: Secondary | ICD-10-CM | POA: Diagnosis present

## 2023-05-11 DIAGNOSIS — I6782 Cerebral ischemia: Secondary | ICD-10-CM | POA: Diagnosis not present

## 2023-05-11 DIAGNOSIS — I672 Cerebral atherosclerosis: Secondary | ICD-10-CM | POA: Diagnosis not present

## 2023-05-11 DIAGNOSIS — Z8673 Personal history of transient ischemic attack (TIA), and cerebral infarction without residual deficits: Secondary | ICD-10-CM

## 2023-05-11 DIAGNOSIS — R2681 Unsteadiness on feet: Secondary | ICD-10-CM | POA: Diagnosis not present

## 2023-05-11 DIAGNOSIS — Z6824 Body mass index (BMI) 24.0-24.9, adult: Secondary | ICD-10-CM

## 2023-05-11 DIAGNOSIS — Z471 Aftercare following joint replacement surgery: Secondary | ICD-10-CM | POA: Diagnosis not present

## 2023-05-11 DIAGNOSIS — I130 Hypertensive heart and chronic kidney disease with heart failure and stage 1 through stage 4 chronic kidney disease, or unspecified chronic kidney disease: Secondary | ICD-10-CM | POA: Diagnosis not present

## 2023-05-11 LAB — COMPREHENSIVE METABOLIC PANEL
ALT: 62 U/L — ABNORMAL HIGH (ref 0–44)
AST: 99 U/L — ABNORMAL HIGH (ref 15–41)
Albumin: 2.9 g/dL — ABNORMAL LOW (ref 3.5–5.0)
Alkaline Phosphatase: 121 U/L (ref 38–126)
Anion gap: 13 (ref 5–15)
BUN: 48 mg/dL — ABNORMAL HIGH (ref 8–23)
CO2: 22 mmol/L (ref 22–32)
Calcium: 8.3 mg/dL — ABNORMAL LOW (ref 8.9–10.3)
Chloride: 97 mmol/L — ABNORMAL LOW (ref 98–111)
Creatinine, Ser: 4.14 mg/dL — ABNORMAL HIGH (ref 0.61–1.24)
GFR, Estimated: 14 mL/min — ABNORMAL LOW (ref >60)
Glucose, Bld: 105 mg/dL — ABNORMAL HIGH (ref 70–99)
Potassium: 6.2 mmol/L — ABNORMAL HIGH (ref 3.5–5.1)
Sodium: 132 mmol/L — ABNORMAL LOW (ref 135–145)
Total Bilirubin: 1.1 mg/dL (ref 0.3–1.2)
Total Protein: 5.3 g/dL — ABNORMAL LOW (ref 6.5–8.1)

## 2023-05-11 LAB — CBC WITH DIFFERENTIAL/PLATELET
Abs Immature Granulocytes: 0.5 10*3/uL — ABNORMAL HIGH (ref 0.00–0.07)
Basophils Absolute: 0 10*3/uL (ref 0.0–0.1)
Basophils Relative: 0 %
Eosinophils Absolute: 0.1 10*3/uL (ref 0.0–0.5)
Eosinophils Relative: 1 %
HCT: 25.9 % — ABNORMAL LOW (ref 39.0–52.0)
Hemoglobin: 8 g/dL — ABNORMAL LOW (ref 13.0–17.0)
Immature Granulocytes: 5 %
Lymphocytes Relative: 5 %
Lymphs Abs: 0.5 10*3/uL — ABNORMAL LOW (ref 0.7–4.0)
MCH: 30.9 pg (ref 26.0–34.0)
MCHC: 30.9 g/dL (ref 30.0–36.0)
MCV: 100 fL (ref 80.0–100.0)
Monocytes Absolute: 1.2 10*3/uL — ABNORMAL HIGH (ref 0.1–1.0)
Monocytes Relative: 12 %
Neutro Abs: 7.3 10*3/uL (ref 1.7–7.7)
Neutrophils Relative %: 77 %
Platelets: 178 10*3/uL (ref 150–400)
RBC: 2.59 MIL/uL — ABNORMAL LOW (ref 4.22–5.81)
RDW: 18.6 % — ABNORMAL HIGH (ref 11.5–15.5)
WBC: 9.6 10*3/uL (ref 4.0–10.5)
nRBC: 0.2 % (ref 0.0–0.2)

## 2023-05-11 LAB — MAGNESIUM: Magnesium: 2.6 mg/dL — ABNORMAL HIGH (ref 1.7–2.4)

## 2023-05-11 LAB — PROTIME-INR
INR: 4.2 (ref 0.8–1.2)
Prothrombin Time: 40.5 s — ABNORMAL HIGH (ref 11.4–15.2)

## 2023-05-11 LAB — TROPONIN I (HIGH SENSITIVITY)
Troponin I (High Sensitivity): 51 ng/L — ABNORMAL HIGH (ref <18)
Troponin I (High Sensitivity): 49 ng/L — ABNORMAL HIGH (ref <18)

## 2023-05-11 LAB — POTASSIUM: Potassium: 5.5 mmol/L — ABNORMAL HIGH (ref 3.5–5.1)

## 2023-05-11 LAB — BRAIN NATRIURETIC PEPTIDE: B Natriuretic Peptide: 1224.2 pg/mL — ABNORMAL HIGH (ref 0.0–100.0)

## 2023-05-11 MED ORDER — SODIUM ZIRCONIUM CYCLOSILICATE 10 G PO PACK
10.0000 g | PACK | Freq: Once | ORAL | Status: AC
  Administered 2023-05-11: 10 g via ORAL
  Filled 2023-05-11: qty 1

## 2023-05-11 MED ORDER — DEXTROSE 50 % IV SOLN
1.0000 | Freq: Once | INTRAVENOUS | Status: AC
  Administered 2023-05-11: 50 mL via INTRAVENOUS
  Filled 2023-05-11: qty 50

## 2023-05-11 MED ORDER — INSULIN ASPART 100 UNIT/ML IV SOLN
5.0000 [IU] | Freq: Once | INTRAVENOUS | Status: AC
  Administered 2023-05-11: 5 [IU] via INTRAVENOUS

## 2023-05-11 MED ORDER — ACETAMINOPHEN 650 MG RE SUPP: 650.0000 mg | Freq: Four times a day (QID) | RECTAL | Status: DC | PRN

## 2023-05-11 MED ORDER — ACETAMINOPHEN 325 MG PO TABS: 650.0000 mg | ORAL_TABLET | Freq: Four times a day (QID) | ORAL | Status: DC | PRN

## 2023-05-11 MED ORDER — ONDANSETRON HCL 4 MG/2ML IJ SOLN: 4.0000 mg | Freq: Four times a day (QID) | INTRAMUSCULAR | Status: DC | PRN

## 2023-05-11 MED ORDER — FUROSEMIDE 10 MG/ML IJ SOLN
40.0000 mg | Freq: Once | INTRAMUSCULAR | Status: AC
  Administered 2023-05-11: 40 mg via INTRAVENOUS
  Filled 2023-05-11: qty 4

## 2023-05-11 MED ORDER — FUROSEMIDE 10 MG/ML IJ SOLN
160.0000 mg | Freq: Two times a day (BID) | INTRAVENOUS | Status: DC
  Administered 2023-05-12: 160 mg via INTRAVENOUS
  Filled 2023-05-11 (×2): qty 16

## 2023-05-11 MED ORDER — MELATONIN 3 MG PO TABS
3.0000 mg | ORAL_TABLET | Freq: Every evening | ORAL | Status: DC | PRN
  Administered 2023-05-13 – 2023-05-17 (×5): 3 mg via ORAL
  Filled 2023-05-11 (×6): qty 1

## 2023-05-11 NOTE — ED Triage Notes (Signed)
PT BIB EMS from [facility] for weakness, over the past 2 days increase of weakness, states he is pale, in September he had a drain placed, it's currently bleeding, that the dressing around the wound has to be changed at least twice a day because it's saturated with blood.  Patient has had a recent chest wall infection.  End stage renal failure, heart failure, ventricular and atrial pacemaker, Paced @ 80 BMP.  22 g L AC 111/73 HR 80 CBG 144 O2 99 RA

## 2023-05-11 NOTE — H&P (Signed)
History and Physical      Austin [name] GUY:[id number] DOB: [birth year] DOA: 05/11/2023; DOS: 05/11/2023  PCP: Laurey Morale, MD *** Patient coming from: home ***  I have personally briefly reviewed patient's old medical records in [facility]  Chief Complaint: ***  HPI: Austin [name] is a 82 y.o. male with medical history significant for *** who is admitted to [facility] on 05/11/2023 with *** after presenting from home*** to [facility] ED complaining of ***.    ***       ***   ED Course:  Vital signs in the ED were notable for the following: ***  Labs were notable for the following: ***  Per my interpretation, EKG in ED demonstrated the following:  ***  Imaging in the ED, per corresponding formal radiology read, was notable for the following:  ***  While in the ED, the following were administered: ***  Subsequently, the patient was admitted  ***  ***red    Review of Systems: As per HPI otherwise 10 point review of systems negative.   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   Conductive hearing loss    uses hearing aids   Dysrhythmia    A-fib   History of blood transfusion 03/24/2023   Hypothyroidism    Intraspinal abscess    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   Nonischemic cardiomyopathy (HCC)    Psychosexual dysfunction with inhibited sexual excitement    S/P mitral valve replacement    Syncope and collapse    Unspecified sleep apnea    last sleep study 11/07, uses cpap   Ventricular tachycardia (HCC)    VT (ventricular tachycardia) (HCC)     Past Surgical History:  Procedure Laterality Date    BIV ICD GENERATOR CHANGEOUT N/A 05/04/2017   Procedure: BiV ICD Generator Changeout;  Surgeon: Marinus Maw, MD;  Location: [facility] INVASIVE CV LAB;  Service: Cardiovascular;  Laterality: N/A;   CARDIAC CATHETERIZATION  04/24/2002   CARDIAC VALVE REPLACEMENT     CARDIOVERSION  03/09/2012   Procedure: CARDIOVERSION;  Surgeon: Marinus Maw, MD;  Location: [facility] ENDOSCOPY;  Service: Cardiovascular;  Laterality: N/A;   CARDIOVERSION N/A 11/22/2013   Procedure: CARDIOVERSION;  Surgeon: Thurmon Fair, MD;  Location: [facility] ENDOSCOPY;  Service: Cardiovascular;  Laterality: N/A;   dental implants     ELECTROPHYSIOLOGIC STUDY N/A 09/15/2015   Procedure: V Tach Ablation;  Surgeon: Marinus Maw, MD;  Location: [facility] INVASIVE CV LAB;  Service: Cardiovascular;  Laterality: N/A;   Evacution of epidural lumbar epidural abscess  07/26/1997   INCISION AND DRAINAGE OF WOUND Left 03/25/2023   Procedure: Washout of Chest Wall Hematoma;  Surgeon: Nada Libman, MD;  Location: [facility] OR;  Service: Vascular;  Laterality: Left;   INCISION AND DRAINAGE OF WOUND N/A 03/29/2023   Procedure: IRRIGATION AND DEBRIDEMENT  CHEST WALL;  Surgeon: Nada Libman, MD;  Location: [facility] OR;  Service: Vascular;  Laterality: N/A;   INSERT / REPLACE / REMOVE PACEMAKER  11/23/2008   IR US GUIDE BX ASP/DRAIN  04/30/2023   MITRAL VALVE REPLACEMENT     w #[id number] [facility]   PERMANENT  PACEMAKER INSERTION N/A 08/05/2011   Procedure: PERMANENT PACEMAKER INSERTION;  Surgeon: Marinus Maw, MD;  Location: [facility] CATH LAB;  Service: Cardiovascular;  Laterality: N/A;   REMOVAL OF BAROSTIM N/A 04/12/2023   Procedure: REMOVAL OF [name];  Surgeon: Nada Libman, MD;  Location: [facility] OR;  Service: Vascular;  Laterality: N/A;   RIGHT HEART CATH N/A 02/26/2021   Procedure: RIGHT HEART CATH;  Surgeon: Laurey Morale, MD;  Location: [facility] INVASIVE CV LAB;  Service: Cardiovascular;  Laterality: N/A;   THYROIDECTOMY     TONSILLECTOMY     V-TACH ABLATION  07/30/2021    in [facility]   VALVE REPLACEMENT  07/26/1998    Social History:  reports that he has never smoked. He has never used smokeless tobacco. He reports that he does not currently use alcohol. He reports that he does not use drugs.   No Known Allergies  Family History  Problem Relation Age of Onset   Heart disease Mother    Heart failure Mother    Heart disease Father    Heart failure Father    Sleep apnea Neg Hx     Family history reviewed and not pertinent ***   Prior to Admission medications   Medication Sig Start Date End Date Taking? Authorizing Provider  acetaminophen (TYLENOL) 500 MG tablet Take 1,000 mg by mouth every 8 (eight) hours as needed for headache, fever or mild pain.   Yes [provider]  amiodarone (PACERONE) 400 MG tablet Take 400 mg by mouth daily.   Yes [provider]  levothyroxine (SYNTHROID) 88 MCG tablet TAKE 1 TABLET BY MOUTH EVERY DAY 03/23/23  Yes [name], MD  melatonin 5 MG TABS Take 5 mg by mouth at bedtime.   Yes [provider]  mirtazapine (REMERON) 7.5 MG tablet Take 1 tablet (7.5 mg total) by mouth at bedtime. 04/25/23  Yes Venita Sheffield, MD  Multiple Vitamin (MULTIVITAMIN WITH MINERALS) TABS tablet Take 1 tablet by mouth in the morning.   Yes [provider]  mupirocin ointment (BACTROBAN) 2 % Apply 1 Application topically daily. Patient taking differently: Apply 1 Application topically See admin instructions. Apply to wound site once daily in the morning with dressing change 03/01/23  Yes McDonald, Rachelle Hora, DPM  nitroGLYCERIN (NITROSTAT) 0.4 MG SL tablet Place 1 tablet (0.4 mg total) under the tongue every 5 (five) minutes x 3 doses as needed for chest pain. 10/14/18  Yes Kilroy, Luke K, PA-C  potassium chloride SA (KLOR-CON M) 20 MEQ tablet Take 1 tablet (20 mEq total) by mouth 2 (two) times daily. 05/09/23  Yes Milford, Anderson Malta, FNP  quiniDINE gluconate 324 MG CR tablet TAKE 1 TABLET BY MOUTH 2 TIMES  DAILY. 01/25/23  Yes Laurey Morale, MD  Torsemide 40 MG TABS Take 80 mg by mouth in the morning and at bedtime.   Yes [provider]  warfarin (COUMADIN) 2.5 MG tablet Take 1-2 tablets Daily or as prescribed by [facility]. Patient taking differently: Take 2.5 mg by mouth daily at 12 noon. 05/06/23   Laurey Morale, MD     Objective    Physical Exam: Vitals:   05/11/23 1936 05/11/23 1945 05/11/23 2010 05/11/23 2130  BP:  92/73 95/63 92/71   Pulse: 84 84  82  Resp: 20 17 17 20   Temp:      TempSrc:      SpO2: 95% 99%  100%    General: appears to be stated age; alert,  oriented Skin: warm, dry, no rash Head:  AT/[place] Mouth:  Oral mucosa membranes appear moist, normal dentition Neck: supple; trachea midline Heart:  RRR; did not appreciate any M/R/G Lungs: CTAB, did not appreciate any wheezes, rales, or rhonchi Abdomen: + BS; soft, ND, NT Vascular: 2+ pedal pulses b/l; 2+ radial pulses b/l Extremities: no peripheral edema, no muscle wasting Neuro: strength and sensation intact in upper and lower extremities b/l ***   *** Neuro: 5/5 strength of the proximal and distal flexors and extensors of the upper and lower extremities bilaterally; sensation intact in upper and lower extremities b/l; cranial nerves II through XII grossly intact; no pronator drift; no evidence suggestive of slurred speech, dysarthria, or facial droop; Normal muscle tone. No tremors.  *** Neuro: In the setting of the patient's current mental status and associated inability to follow instructions, unable to perform full neurologic exam at this time.  As such, assessment of strength, sensation, and cranial nerves is limited at this time. Patient noted to spontaneously move all 4 extremities. No tremors.  ***    Labs on Admission: I have personally reviewed following labs and imaging studies  CBC: Recent Labs  Lab 05/08/23 2032 05/11/23 1832  WBC 7.1 9.6  NEUTROABS  --  7.3  HGB 9.4* 8.0*   HCT 31.1* 25.9*  MCV 101.3* 100.0  PLT 175 178   Basic Metabolic Panel: Recent Labs  Lab 05/05/23 1222 05/08/23 2032 05/11/23 1832  NA 137 139 132*  K 3.5 4.3 6.2*  CL 98 100 97*  CO2 28 25 22   GLUCOSE 95 126* 105*  BUN 26* 33* 48*  CREATININE 2.90* 3.64* 4.14*  CALCIUM 8.5* 8.7* 8.3*  MG  --   --  2.6*   GFR: Estimated Creatinine Clearance: 16.1 mL/min (A) (by C-G formula based on SCr of 4.14 mg/dL (H)). Liver Function Tests: Recent Labs  Lab 05/08/23 2032 05/11/23 1832  AST 47* 99*  ALT 28 62*  ALKPHOS 107 121  BILITOT 1.3* 1.1  PROT 6.3* 5.3*  ALBUMIN 3.2* 2.9*   No results for input(s): "LIPASE", "AMYLASE" in the last 168 hours. No results for input(s): "AMMONIA" in the last 168 hours. Coagulation Profile: Recent Labs  Lab 05/08/23 2120 05/11/23 1832  INR 1.8* 4.2*   Cardiac Enzymes: No results for input(s): "CKTOTAL", "CKMB", "CKMBINDEX", "TROPONINI" in the last 168 hours. BNP (last 3 results) Recent Labs    12/16/22 0959  PROBNP 3,692*   HbA1C: No results for input(s): "HGBA1C" in the last 72 hours. CBG: Recent Labs  Lab 05/08/23 1948  GLUCAP 110*   Lipid Profile: No results for input(s): "CHOL", "HDL", "LDLCALC", "TRIG", "CHOLHDL", "LDLDIRECT" in the last 72 hours. Thyroid Function Tests: No results for input(s): "TSH", "T4TOTAL", "FREET4", "T3FREE", "THYROIDAB" in the last 72 hours. Anemia Panel: No results for input(s): "VITAMINB12", "FOLATE", "FERRITIN", "TIBC", "IRON", "RETICCTPCT" in the last 72 hours. Urine analysis:    Component Value Date/Time   COLORURINE YELLOW 05/04/2023 1525   APPEARANCEUR CLEAR 05/04/2023 1525   LABSPEC 1.009 05/04/2023 1525   PHURINE 6.0 05/04/2023 1525   GLUCOSEU NEGATIVE 05/04/2023 1525   HGBUR NEGATIVE 05/04/2023 1525   BILIRUBINUR NEGATIVE 05/04/2023 1525   KETONESUR NEGATIVE 05/04/2023 1525   PROTEINUR NEGATIVE 05/04/2023 1525   UROBILINOGEN 0.2 08/14/2021 1612   NITRITE NEGATIVE 05/04/2023 1525    LEUKOCYTESUR NEGATIVE 05/04/2023 1525    Radiological Exams on Admission: CT Head Wo Contrast  Result Date: 05/11/2023 CLINICAL DATA:  Mental status change EXAM: CT  HEAD WITHOUT CONTRAST TECHNIQUE: Contiguous axial images were obtained from the base of the skull through the vertex without intravenous contrast. RADIATION DOSE REDUCTION: This exam was performed according to the departmental dose-optimization program which includes automated exposure control, adjustment of the mA and/or kV according to patient size and/or use of iterative reconstruction technique. COMPARISON:  CT brain 05/04/2023 FINDINGS: Brain: No hemorrhage or intracranial mass. 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 Chronic small vessel ischemic changes of the white matter and atrophy. Question hypodensity within the medial left temporal lobe, series 2 image 9 and 10, but appearance on sagittal views suggest potentially related to artifact. Vascular: No hyperdense vessels. Vertebral and carotid vascular calcification Skull: Normal. Negative for fracture or focal lesion. Sinuses/Orbits: No acute finding. Other: None IMPRESSION: 1. Negative for hemorrhage, mass, mass effect or midline shift 2. Chronic small vessel ischemic changes of the white matter and atrophy. 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 3. Hypodensity at the medial left temporal lobe potentially due to artifact though appearance on axial views makes it difficult to exclude acute to subacute infarct. Electronically Signed   By: Jasmine Pang M.D.   On: 05/11/2023 21:55   DG Chest Port 1 View  Result Date: 05/11/2023 CLINICAL DATA:  Weakness EXAM: PORTABLE CHEST 1 VIEW COMPARISON:  05/04/2023, 04/09/2023 FINDINGS: Post sternotomy changes and valve prosthesis. Right-sided multi lead pacing device as before. Low lung volumes. Cardiomegaly. Chronic elevation right diaphragm. Mild central congestion. No  consolidation. Possible small left effusion. Removal of previously noted left upper chest drainage catheter IMPRESSION: Cardiomegaly with mild central congestion. Possible small left effusion. Electronically Signed   By: Jasmine Pang M.D.   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      Chaney Born Chanele Douglas DO [facility]  From 7PM - 7AM   05/11/2023, 10:29 PM   ***

## 2023-05-11 NOTE — ED Notes (Signed)
Pharmacy tech at bedside with PT

## 2023-05-11 NOTE — ED Provider Notes (Signed)
[facility] AT [facility] Provider Note  CSN: [id number] Arrival date & time: 05/11/23 1746  Chief Complaint(s) Weakness  HPI Austin [name] is a 82 y.o. male history of CHF, ICD placement, atrial fibrillation on Coumadin, mitral valve replacement, recent admissions for complication of Bero stem placement with residual left chest wall drain presenting with weakness.  Patient reports over the past 2 days he has had weakness, weight gain, shortness of breath, dyspnea on exertion.  No fevers or chills.  Reports generalized weakness but right leg feels more weak than the rest of his body.  No urinary symptoms, incontinence.  Has not been able to walk due to weakness.  Currently in inpatient rehab.  No fevers or chills, cough.  No abdominal pain.  No chest pain.  Chest drain has been oozing bit but patient reports that they plan to take it out soon.   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   Conductive hearing loss    uses hearing aids   Dysrhythmia    A-fib   History of blood transfusion 03/24/2023   Hypothyroidism    Intraspinal abscess    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   Nonischemic cardiomyopathy (HCC)    Psychosexual dysfunction with inhibited sexual excitement    S/P mitral valve replacement    Syncope and collapse    Unspecified sleep apnea    last sleep study 11/07, uses cpap   Ventricular tachycardia (HCC)    VT (ventricular tachycardia) (HCC)    Patient Active Problem List   Diagnosis Date Noted   Acute on chronic diastolic heart failure (HCC) 05/11/2023   Postoperative seroma  involving circulatory system after other circulatory system procedure 05/01/2023   Hyponatremia 04/27/2023   Insomnia 04/27/2023   Heart failure (HCC) 04/06/2023   Postprocedural seroma of skin and subcutaneous tissue following other procedure 03/23/2023   Callous ulcer, limited to breakdown of skin (HCC) 09/27/2022   OSA on CPAP 09/27/2022   Dyspnea 07/05/2022   Anemia due to zinc deficiency 05/25/2022   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   Stage 3b chronic kidney disease (HCC) 05/17/2022   Anemia 05/17/2022   Need for immunization against influenza 05/17/2022   Chronic anticoagulation 10/14/2018   Ventricular tachycardia (HCC) 10/12/2018   Neuropathy 08/25/2018   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   Nonischemic cardiomyopathy (HCC)    Subtherapeutic international normalized ratio (INR) 01/10/2016   S/P ablation of ventricular arrhythmia 01/10/2016   Hypothyroidism 10/27/2015   H/O mitral valve replacement with mechanical valve    Anticoagulation goal of INR 2.5 to 3.5 08/20/2013   Cardiac device, implant, or graft infection or inflammation (HCC) 06/28/2011   ICD (implantable cardioverter-defibrillator) in place 06/02/2009   Acute on chronic systolic heart failure (HCC) 04/15/2009   ERECTILE DYSFUNCTION 11/27/2008   HEARING LOSS 11/27/2008   Atrial fibrillation (HCC) 11/27/2008   SLEEP APNEA 11/27/2008   Home Medication(s) Prior to Admission medications   Medication Sig Start Date End Date Taking? Authorizing Provider  acetaminophen (TYLENOL) 500 MG tablet Take 1,000 mg by mouth every 8 (eight) hours as needed for headache,  fever or mild pain.   Yes [provider]  amiodarone (PACERONE) 400 MG tablet Take 400 mg by mouth daily.   Yes [provider]  levothyroxine (SYNTHROID) 88 MCG tablet TAKE 1 TABLET BY MOUTH EVERY DAY 03/23/23  Yes [name], MD  melatonin 5 MG TABS Take 5 mg by mouth at  bedtime.   Yes [provider]  mirtazapine (REMERON) 7.5 MG tablet Take 1 tablet (7.5 mg total) by mouth at bedtime. 04/25/23  Yes Venita Sheffield, MD  Multiple Vitamin (MULTIVITAMIN WITH MINERALS) TABS tablet Take 1 tablet by mouth in the morning.   Yes [provider]  mupirocin ointment (BACTROBAN) 2 % Apply 1 Application topically daily. Patient taking differently: Apply 1 Application topically See admin instructions. Apply to wound site once daily in the morning with dressing change 03/01/23  Yes McDonald, Rachelle Hora, DPM  nitroGLYCERIN (NITROSTAT) 0.4 MG SL tablet Place 1 tablet (0.4 mg total) under the tongue every 5 (five) minutes x 3 doses as needed for chest pain. 10/14/18  Yes Kilroy, Luke K, PA-C  potassium chloride SA (KLOR-CON M) 20 MEQ tablet Take 1 tablet (20 mEq total) by mouth 2 (two) times daily. 05/09/23  Yes Milford, Anderson Malta, FNP  quiniDINE gluconate 324 MG CR tablet TAKE 1 TABLET BY MOUTH 2 TIMES DAILY. 01/25/23  Yes Laurey Morale, MD  Torsemide 40 MG TABS Take 80 mg by mouth in the morning and at bedtime.   Yes [provider]  warfarin (COUMADIN) 2.5 MG tablet Take 1-2 tablets Daily or as prescribed by [facility]. Patient taking differently: Take 2.5 mg by mouth daily at 12 noon. 05/06/23   Laurey Morale, MD                                                                                                                                    Past Surgical History Past Surgical History:  Procedure Laterality Date   BIV ICD GENERATOR CHANGEOUT N/A 05/04/2017   Procedure: BiV ICD Generator Changeout;  Surgeon: Marinus Maw, MD;  Location: [facility] INVASIVE CV LAB;  Service: Cardiovascular;  Laterality: N/A;   CARDIAC CATHETERIZATION  04/24/2002   CARDIAC VALVE REPLACEMENT     CARDIOVERSION  03/09/2012   Procedure: CARDIOVERSION;  Surgeon: Marinus Maw, MD;  Location: [facility] ENDOSCOPY;  Service: Cardiovascular;  Laterality: N/A;   CARDIOVERSION N/A  11/22/2013   Procedure: CARDIOVERSION;  Surgeon: Thurmon Fair, MD;  Location: [facility] ENDOSCOPY;  Service: Cardiovascular;  Laterality: N/A;   dental implants     ELECTROPHYSIOLOGIC STUDY N/A 09/15/2015   Procedure: V Tach Ablation;  Surgeon: Marinus Maw, MD;  Location: [facility] INVASIVE CV LAB;  Service: Cardiovascular;  Laterality: N/A;   Evacution of epidural lumbar epidural abscess  07/26/1997   INCISION AND DRAINAGE OF WOUND Left 03/25/2023   Procedure: Washout of Chest Wall Hematoma;  Surgeon: Nada Libman, MD;  Location: [facility] OR;  Service: Vascular;  Laterality: Left;   INCISION AND DRAINAGE OF WOUND N/A 03/29/2023   Procedure: IRRIGATION AND DEBRIDEMENT  CHEST WALL;  Surgeon: Nada Libman, MD;  Location: [facility] OR;  Service: Vascular;  Laterality: N/A;   INSERT / REPLACE / REMOVE PACEMAKER  11/23/2008   IR US GUIDE BX ASP/DRAIN  04/30/2023   MITRAL VALVE REPLACEMENT     w #[id number] [facility]   PERMANENT PACEMAKER INSERTION N/A 08/05/2011   Procedure: PERMANENT PACEMAKER INSERTION;  Surgeon: Marinus Maw, MD;  Location: [facility] CATH LAB;  Service: Cardiovascular;  Laterality: N/A;   REMOVAL OF BAROSTIM N/A 04/12/2023   Procedure: REMOVAL OF [name];  Surgeon: Nada Libman, MD;  Location: [facility] OR;  Service: Vascular;  Laterality: N/A;   RIGHT HEART CATH N/A 02/26/2021   Procedure: RIGHT HEART CATH;  Surgeon: Laurey Morale, MD;  Location[facility] INVASIVE CV LAB;  Service: Cardiovascular;  Laterality: N/A;   THYROIDECTOMY     TONSILLECTOMY     V-TACH ABLATION  07/30/2021   in [facility]   VALVE REPLACEMENT  07/26/1998   Family History Family History  Problem Relation Age of Onset   Heart disease Mother    Heart failure Mother    Heart disease Father    Heart failure Father    Sleep apnea Neg Hx     Social History Social History   Tobacco Use   Smoking status: Never   Smokeless tobacco: Never  Vaping Use   Vaping status: Never Used  Substance Use Topics   Alcohol use: Not Currently     Comment: occasionally   Drug use: No   Allergies Patient has no known allergies.  Review of Systems Review of Systems  All other systems reviewed and are negative.   Physical Exam Vital Signs  I have reviewed the triage vital signs BP 92/71   Pulse 82   Temp (!) 97 F (36.1 C) (Axillary)   Resp 20   SpO2 100%  Physical Exam Vitals and nursing note reviewed.  Constitutional:      General: He is not in acute distress.    Comments: Chronically ill appearing  HENT:     Mouth/Throat:     Mouth: Mucous membranes are moist.  Eyes:     Conjunctiva/sclera: Conjunctivae normal.  Cardiovascular:     Rate and Rhythm: Normal rate and regular rhythm.  Pulmonary:     Effort: Pulmonary effort is normal. No respiratory distress.     Breath sounds: Normal breath sounds.     Comments: Trace bibasilar crackles Abdominal:     General: Abdomen is flat.     Palpations: Abdomen is soft.     Tenderness: There is no abdominal tenderness.  Musculoskeletal:     Right lower leg: Edema present.     Left lower leg: Edema present.  Skin:    General: Skin is warm and dry.     Capillary Refill: Capillary refill takes less than 2 seconds.  Neurological:     Mental Status: He is alert and oriented to person, place, and time. Mental status is at baseline.     Comments: Cranial nerves II through XII intact, strength 4/5 RLE, 5/5 in the BUE and LLE. No dysmetria   Psychiatric:        Mood and Affect: Mood normal.        Behavior: Behavior normal.     ED Results and Treatments Labs (all labs ordered are listed, but  only abnormal results are displayed) Labs Reviewed  COMPREHENSIVE METABOLIC PANEL - Abnormal; Notable for the following components:      Result Value   Sodium 132 (*)    Potassium 6.2 (*)    Chloride 97 (*)    Glucose, Bld 105 (*)    BUN 48 (*)    Creatinine, Ser 4.14 (*)    Calcium 8.3 (*)    Total Protein 5.3 (*)    Albumin 2.9 (*)    AST 99 (*)    ALT 62 (*)    GFR,  Estimated 14 (*)    All other components within normal limits  CBC WITH DIFFERENTIAL/PLATELET - Abnormal; Notable for the following components:   RBC 2.59 (*)    Hemoglobin 8.0 (*)    HCT 25.9 (*)    RDW 18.6 (*)    Lymphs Abs 0.5 (*)    Monocytes Absolute 1.2 (*)    Abs Immature Granulocytes 0.50 (*)    All other components within normal limits  BRAIN NATRIURETIC PEPTIDE - Abnormal; Notable for the following components:   B Natriuretic Peptide 1,224.2 (*)    All other components within normal limits  PROTIME-INR - Abnormal; Notable for the following components:   Prothrombin Time 40.5 (*)    INR 4.2 (*)    All other components within normal limits  MAGNESIUM - Abnormal; Notable for the following components:   Magnesium 2.6 (*)    All other components within normal limits  TROPONIN I (HIGH SENSITIVITY) - Abnormal; Notable for the following components:   Troponin I (High Sensitivity) 51 (*)    All other components within normal limits  TROPONIN I (HIGH SENSITIVITY) - Abnormal; Notable for the following components:   Troponin I (High Sensitivity) 49 (*)    All other components within normal limits  URINALYSIS, ROUTINE W REFLEX MICROSCOPIC  POTASSIUM                                                                                                                          Radiology CT Head Wo Contrast  Result Date: 05/11/2023 CLINICAL DATA:  Mental status change EXAM: CT HEAD WITHOUT CONTRAST TECHNIQUE: Contiguous axial images were obtained from the base of the skull through the vertex without intravenous contrast. RADIATION DOSE REDUCTION: This exam was performed according to the departmental dose-optimization program which includes automated exposure control, adjustment of the mA and/or kV according to patient size and/or use of iterative reconstruction technique. COMPARISON:  CT brain 05/04/2023 FINDINGS: Brain: No hemorrhage or intracranial mass. Small chronic right cerebellar  infarct. Chronic bilateral frontal lobe infarcts. Chronic small vessel ischemic changes of the white matter and atrophy. Question hypodensity within the medial left temporal lobe, series 2 image 9 and 10, but appearance on sagittal views suggest potentially related to artifact. Vascular: No hyperdense vessels. Vertebral and carotid vascular calcification Skull: Normal. Negative for fracture or focal lesion. Sinuses/Orbits: No acute finding. Other: None IMPRESSION: 1. Negative for hemorrhage,  mass, mass effect or midline shift 2. Chronic small vessel ischemic changes of the white matter and atrophy. 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 3. Hypodensity at the medial left temporal lobe potentially due to artifact though appearance on axial views makes it difficult to exclude acute to subacute infarct. Electronically Signed   By: Jasmine Pang M.D.   On: 05/11/2023 21:55   DG Chest Port 1 View  Result Date: 05/11/2023 CLINICAL DATA:  Weakness EXAM: PORTABLE CHEST 1 VIEW COMPARISON:  05/04/2023, 04/09/2023 FINDINGS: Post sternotomy changes and valve prosthesis. Right-sided multi lead pacing device as before. Low lung volumes. Cardiomegaly. Chronic elevation right diaphragm. Mild central congestion. No consolidation. Possible small left effusion. Removal of previously noted left upper chest drainage catheter IMPRESSION: Cardiomegaly with mild central congestion. Possible small left effusion. Electronically Signed   By: Jasmine Pang M.D.   On: 05/11/2023 21:50    Pertinent labs & imaging results that were available during my care of the patient were reviewed by me and considered in my medical decision making (see MDM for details).  Medications Ordered in ED Medications  furosemide (LASIX) injection 40 mg (40 mg Intravenous Given 05/11/23 2015)  sodium zirconium cyclosilicate (LOKELMA) packet 10 g (10 g Oral Given 05/11/23 2206)  insulin aspart (novoLOG) injection 5 Units (5 Units  Intravenous Given 05/11/23 2213)    And  dextrose 50 % solution 50 mL (50 mLs Intravenous Given 05/11/23 2214)                                                                                                                                     Procedures .Critical Care  Performed by: Lonell Grandchild, MD Authorized by: Lonell Grandchild, MD   Critical care provider statement:    Critical care time (minutes):  30   Critical care was necessary to treat or prevent imminent or life-threatening deterioration of the following conditions:  Renal failure   Critical care was time spent personally by me on the following activities:  Development of treatment plan with patient or surrogate, discussions with consultants, evaluation of patient's response to treatment, examination of patient, ordering and review of laboratory studies, ordering and review of radiographic studies, ordering and performing treatments and interventions, pulse oximetry, re-evaluation of patient's condition and review of old charts   Care discussed with: admitting provider     (including critical care time)  Medical Decision Making / ED Course   MDM:  82 year old male presenting to the emergency department with weakness.  Patient in no acute distress but appears chronically ill.  Differential includes CHF exacerbation, anemia, stroke or intracranial bleeding, toxic or metabolic abnormality, electrolyte disturbance, occult infectious process.  He reports some right leg pain, low concern for any spinal pathology without back pain, numbness, tingling or incontinence, but will obtain CT head to evaluate for stroke.  Given degree of weakness, anticipate patient will likely need  to be admitted.  Clinical Course as of 05/11/23 2216  Wed May 11, 2023  2210 Discussed with Dr. Arlean Hopping who will admit patient.  [WS]  2212 Labs notable for worsening CKD with mild hyperkalemia.  Patient does have possibly some mild QRS widening on  his ECG although he is otherwise hemodynamically stable.  He received Lasix, Lokelma.  Will give insulin and glucose.  Will recheck potassium.  BNP is elevated.  Chest x-ray with possible pleural effusion.  Suspect component of CHF exacerbation and patient has received Lasix.  CT head does show some left temporal hypodensity versus artifact which may potentially explain his right lower extremity weakness so MRI brain has been ordered to further evaluate this.  Discussed with the hospitalist will admit the patient. [WS]    Clinical Course User Index [WS] Lonell Grandchild, MD     Additional history obtained: -Additional history obtained from ems -External records from outside source obtained and reviewed including: Chart review including previous notes, labs, imaging, consultation notes including prior hospitalizations    Lab Tests: -I ordered, reviewed, and interpreted labs.   The pertinent results include:   Labs Reviewed  COMPREHENSIVE METABOLIC PANEL - Abnormal; Notable for the following components:      Result Value   Sodium 132 (*)    Potassium 6.2 (*)    Chloride 97 (*)    Glucose, Bld 105 (*)    BUN 48 (*)    Creatinine, Ser 4.14 (*)    Calcium 8.3 (*)    Total Protein 5.3 (*)    Albumin 2.9 (*)    AST 99 (*)    ALT 62 (*)    GFR, Estimated 14 (*)    All other components within normal limits  CBC WITH DIFFERENTIAL/PLATELET - Abnormal; Notable for the following components:   RBC 2.59 (*)    Hemoglobin 8.0 (*)    HCT 25.9 (*)    RDW 18.6 (*)    Lymphs Abs 0.5 (*)    Monocytes Absolute 1.2 (*)    Abs Immature Granulocytes 0.50 (*)    All other components within normal limits  BRAIN NATRIURETIC PEPTIDE - Abnormal; Notable for the following components:   B Natriuretic Peptide 1,224.2 (*)    All other components within normal limits  PROTIME-INR - Abnormal; Notable for the following components:   Prothrombin Time 40.5 (*)    INR 4.2 (*)    All other components within  normal limits  MAGNESIUM - Abnormal; Notable for the following components:   Magnesium 2.6 (*)    All other components within normal limits  TROPONIN I (HIGH SENSITIVITY) - Abnormal; Notable for the following components:   Troponin I (High Sensitivity) 51 (*)    All other components within normal limits  TROPONIN I (HIGH SENSITIVITY) - Abnormal; Notable for the following components:   Troponin I (High Sensitivity) 49 (*)    All other components within normal limits  URINALYSIS, ROUTINE W REFLEX MICROSCOPIC  POTASSIUM    Notable for AKI with mild hyperkalemia, elevated BNP, troponin elevation which is stable on re-check, supratherapeutic INR   EKG   EKG Interpretation Date/Time:  Wednesday May 11 2023 17:57:55 EDT Ventricular Rate:  84 PR Interval:  47 QRS Duration:  200 QT Interval:  543 QTC Calculation: 642 R Axis:   -84  Text Interpretation: A-V dual-paced rhythm with some inhibition No further analysis attempted due to paced rhythm Confirmed by Alvino Blood ([id number]) on 05/11/2023 6:31:57 PM  Imaging Studies ordered: I ordered imaging studies including CXR On my interpretation imaging demonstrates pleural effusion I independently visualized and interpreted imaging. I agree with the radiologist interpretation   Medicines ordered and prescription drug management: Meds ordered this encounter  Medications   furosemide (LASIX) injection 40 mg   sodium zirconium cyclosilicate (LOKELMA) packet 10 g   AND Linked Order Group    insulin aspart (novoLOG) injection 5 Units    dextrose 50 % solution 50 mL    -I have reviewed the patients home medicines and have made adjustments as needed   Consultations Obtained: I requested consultation with the hospitalist,  and discussed lab and imaging findings as well as pertinent plan - they recommend: admission   Cardiac Monitoring: The patient was maintained on a cardiac monitor.  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   Conductive hearing loss    uses hearing aids   Dysrhythmia    A-fib   History of blood transfusion 03/24/2023   Hypothyroidism    Intraspinal abscess    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   Nonischemic cardiomyopathy (HCC)    Psychosexual dysfunction with inhibited sexual excitement    S/P mitral valve replacement    Syncope and collapse    Unspecified sleep apnea    last sleep study 11/07, uses cpap   Ventricular tachycardia (HCC)    VT (ventricular tachycardia) (HCC)       Dispostion: Disposition decision including need for hospitalization was considered, and patient admitted to the hospital.    Final Clinical Impression(s) / ED Diagnoses Final diagnoses:  Acute on chronic congestive heart failure, unspecified heart failure type (HCC)  Supratherapeutic INR  Acute kidney injury superimposed on chronic kidney disease (HCC)  Hyperkalemia     This chart was dictated using voice recognition software.  Despite best efforts to proofread,  errors can occur which can change the documentation meaning.    Lonell Grandchild, MD 05/11/23 2216

## 2023-05-12 ENCOUNTER — Encounter (HOSPITAL_COMMUNITY): Payer: Self-pay | Admitting: Internal Medicine

## 2023-05-12 DIAGNOSIS — E875 Hyperkalemia: Secondary | ICD-10-CM | POA: Insufficient documentation

## 2023-05-12 DIAGNOSIS — I5023 Acute on chronic systolic (congestive) heart failure: Secondary | ICD-10-CM | POA: Diagnosis not present

## 2023-05-12 DIAGNOSIS — I482 Chronic atrial fibrillation, unspecified: Secondary | ICD-10-CM | POA: Diagnosis not present

## 2023-05-12 DIAGNOSIS — E039 Hypothyroidism, unspecified: Secondary | ICD-10-CM | POA: Diagnosis not present

## 2023-05-12 DIAGNOSIS — N179 Acute kidney failure, unspecified: Secondary | ICD-10-CM | POA: Diagnosis present

## 2023-05-12 DIAGNOSIS — R531 Weakness: Secondary | ICD-10-CM | POA: Insufficient documentation

## 2023-05-12 DIAGNOSIS — R5383 Other fatigue: Secondary | ICD-10-CM | POA: Insufficient documentation

## 2023-05-12 LAB — CBC WITH DIFFERENTIAL/PLATELET
Abs Immature Granulocytes: 0.35 10*3/uL — ABNORMAL HIGH (ref 0.00–0.07)
Basophils Absolute: 0 10*3/uL (ref 0.0–0.1)
Basophils Relative: 0 %
Eosinophils Absolute: 0.1 10*3/uL (ref 0.0–0.5)
Eosinophils Relative: 1 %
HCT: 23.9 % — ABNORMAL LOW (ref 39.0–52.0)
Hemoglobin: 7.4 g/dL — ABNORMAL LOW (ref 13.0–17.0)
Immature Granulocytes: 3 %
Lymphocytes Relative: 4 %
Lymphs Abs: 0.5 10*3/uL — ABNORMAL LOW (ref 0.7–4.0)
MCH: 30.7 pg (ref 26.0–34.0)
MCHC: 31 g/dL (ref 30.0–36.0)
MCV: 99.2 fL (ref 80.0–100.0)
Monocytes Absolute: 1.4 10*3/uL — ABNORMAL HIGH (ref 0.1–1.0)
Monocytes Relative: 12 %
Neutro Abs: 9.1 10*3/uL — ABNORMAL HIGH (ref 1.7–7.7)
Neutrophils Relative %: 80 %
Platelets: 159 10*3/uL (ref 150–400)
RBC: 2.41 MIL/uL — ABNORMAL LOW (ref 4.22–5.81)
RDW: 18.9 % — ABNORMAL HIGH (ref 11.5–15.5)
WBC: 11.4 10*3/uL — ABNORMAL HIGH (ref 4.0–10.5)
nRBC: 0.4 % — ABNORMAL HIGH (ref 0.0–0.2)

## 2023-05-12 LAB — BASIC METABOLIC PANEL
Anion gap: 10 (ref 5–15)
BUN: 49 mg/dL — ABNORMAL HIGH (ref 8–23)
CO2: 23 mmol/L (ref 22–32)
Calcium: 7.9 mg/dL — ABNORMAL LOW (ref 8.9–10.3)
Chloride: 100 mmol/L (ref 98–111)
Creatinine, Ser: 4.3 mg/dL — ABNORMAL HIGH (ref 0.61–1.24)
GFR, Estimated: 13 mL/min — ABNORMAL LOW (ref >60)
Glucose, Bld: 131 mg/dL — ABNORMAL HIGH (ref 70–99)
Potassium: 5.2 mmol/L — ABNORMAL HIGH (ref 3.5–5.1)
Sodium: 133 mmol/L — ABNORMAL LOW (ref 135–145)

## 2023-05-12 LAB — URINALYSIS, ROUTINE W REFLEX MICROSCOPIC
Bilirubin Urine: NEGATIVE
Glucose, UA: NEGATIVE mg/dL
Hgb urine dipstick: NEGATIVE
Ketones, ur: NEGATIVE mg/dL
Leukocytes,Ua: NEGATIVE
Nitrite: NEGATIVE
Protein, ur: NEGATIVE mg/dL
Specific Gravity, Urine: 1.009 (ref 1.005–1.030)
pH: 6 (ref 5.0–8.0)

## 2023-05-12 LAB — TSH: TSH: 12.925 u[IU]/mL — ABNORMAL HIGH (ref 0.350–4.500)

## 2023-05-12 LAB — PROTIME-INR
INR: 3.6 — ABNORMAL HIGH (ref 0.8–1.2)
Prothrombin Time: 36.3 s — ABNORMAL HIGH (ref 11.4–15.2)

## 2023-05-12 LAB — VITAMIN B12: Vitamin B-12: 997 pg/mL — ABNORMAL HIGH (ref 180–914)

## 2023-05-12 MED ORDER — QUINIDINE GLUCONATE ER 324 MG PO TBCR: 324.0000 mg | EXTENDED_RELEASE_TABLET | Freq: Two times a day (BID) | ORAL | Status: DC

## 2023-05-12 MED ORDER — LEVOTHYROXINE SODIUM 88 MCG PO TABS
88.0000 ug | ORAL_TABLET | Freq: Every day | ORAL | Status: DC
  Administered 2023-05-12 – 2023-05-18 (×7): 88 ug via ORAL
  Filled 2023-05-12 (×7): qty 1

## 2023-05-12 MED ORDER — AMIODARONE HCL 200 MG PO TABS
400.0000 mg | ORAL_TABLET | Freq: Every day | ORAL | Status: DC
  Administered 2023-05-12 – 2023-05-18 (×7): 400 mg via ORAL
  Filled 2023-05-12 (×7): qty 2

## 2023-05-12 NOTE — ED Notes (Signed)
Warm blankets placed on patient. Will reassess temperature shortly.

## 2023-05-12 NOTE — Progress Notes (Addendum)
PHARMACY - ANTICOAGULATION CONSULT NOTE  Pharmacy Consult for warfarin Indication:  mMVR + AFib  No Known Allergies  Patient Measurements: Height: 6\' 1"  (185.4 cm) Weight: 88 kg (194 lb 0.1 oz) IBW/kg (Calculated) : 79.9  Vital Signs: Temp: 97.8 F (36.6 C) (10/17 0438) Temp Source: Oral (10/17 0438) BP: 101/66 (10/17 0430) Pulse Rate: 84 (10/17 0430)  Labs: Recent Labs    05/11/23 1832 05/11/23 2022 05/12/23 0426  HGB 8.0*  --  7.4*  HCT 25.9*  --  23.9*  PLT 178  --  159  LABPROT 40.5*  --   --   INR 4.2*  --   --   CREATININE 4.14*  --   --   TROPONINIHS 51* 49*  --     Estimated Creatinine Clearance: 16.1 mL/min (A) (by C-G formula based on SCr of 4.14 mg/dL (H)).   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   Conductive hearing loss    uses hearing aids   Dysrhythmia    A-fib   History of blood transfusion 03/24/2023   Hypothyroidism    Intraspinal abscess    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   Nonischemic cardiomyopathy (HCC)    Psychosexual dysfunction with inhibited sexual excitement    S/P mitral valve replacement    Syncope and collapse    Unspecified sleep apnea    last sleep study 11/07, uses cpap   Ventricular tachycardia (HCC)    VT (ventricular tachycardia) (HCC)      Assessment: [name] with AFib and mMVR on warfarin PTA admitted with weakness and dyspnea. Pharmacy consulted for warfarin. INR on admit was elevated at 4.2.  Goal of Therapy:  INR 3-3.5 Monitor platelets by anticoagulation protocol: Yes   Plan:  Hold warfarin for now Check INR with am labs this morning  Fredonia Highland, PharmD,  BCPS, [facility] Clinical Pharmacist Please check AMION for all [facility] Pharm[facility] numbers 05/12/2023

## 2023-05-12 NOTE — ED Notes (Signed)
1 assist transfer to bedside commode.

## 2023-05-12 NOTE — Hospital Course (Addendum)
Austin [name] was admitted to the hospital with the working diagnosis of heart failure decompensation.  82 yo male with the past medical history of heart failure, atrial fibrillation, sp mitral valve replacement, CKD and hypothyroidism who presented with dyspnea. Recent neuro stimulator removal due to pseudomonas infection 04/12/23.  Reported 2 days of worsening dyspnea, associated with lower extremity edema and orthopnea. Worsening weakness right lower extremity. On his initial physical examination his blood pressure was 92/73, HR 84, RR 17 and 02 saturation 99%, lungs with no wheezing or rhonchi, heart with S1 and S2 present and regular, abdomen with no distention and positive lower extremity edema.   Na 132, K 6,2 Cl 97, bicarbonate 22, glucose 105 bun 48 cr 4.14 Mg 2.6  AST 99, ALT 62 BNP 1,224  High sensitive troponin 51 and 49  Wbc 9,6 hgb 8,0 plt 178  INR 4,2  TSH 12.9  Urine analysis SG 1,009, negative protein, negative leukocytes and negative hgb.   Head CT negative for hemorrhage, mas effect of midline shift. Chronic small vessel ischemic changes of the white matter and atrophy. 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  Hypodensity at the medial left temporal lobe potentially due to artifact though appearance an axial views makes if difficult to exclude acute to subacute infarct.   Chest radiograph with hypoinflation, cardiomegaly with bilateral hilar vascular congestion, no infiltrates, pacemaker defibrillator in the right hemithorax with one right atrial lead, one right and one left ventricular leads.   EKG 84 bpm, left axis deviation, interventricular conduction delay, qtc 612, atrial and ventricular paced rhythm with no significant ST segment or T wave changes.   10/18 improvement in volume status, renal function not yet stable. 10/19 renal function stable, pending insurance authorization in order to return to SNF.  10/20 pending transfer to SNF. Will check in am with  vascular surgery for drain evaluation. 10/21 hgb down to 7.1., transfusing today one unit PRBC.

## 2023-05-12 NOTE — Assessment & Plan Note (Addendum)
03/2023 echocardiogram with reduced LV systolic function EF 20%, global hypokinesis, RV systolic function with mild reduction, moderate TR, (limited).   Volume status has improved.  Systolic blood pressure 93 to 97 mmHg.   Limited pharmacologic therapy due to hypotension.  Will add midodrine for blood pressure support.

## 2023-05-12 NOTE — Progress Notes (Signed)
PHARMACY - ANTICOAGULATION CONSULT NOTE  Pharmacy Consult for warfarin Indication:  mMVR + AFib  No Known Allergies  Patient Measurements: Height: 6\' 1"  (185.4 cm) Weight: 88 kg (194 lb 0.1 oz) IBW/kg (Calculated) : 79.9  Vital Signs: Temp: 97.8 F (36.6 C) (10/17 1411) Temp Source: Oral (10/17 1411) BP: 85/61 (10/17 1411) Pulse Rate: 95 (10/17 1411)  Labs: Recent Labs    05/11/23 1832 05/11/23 2022 05/12/23 0426 05/12/23 0743 05/12/23 1144  HGB 8.0*  --  7.4*  --   --   HCT 25.9*  --  23.9*  --   --   PLT 178  --  159  --   --   LABPROT 40.5*  --   --  36.3*  --   INR 4.2*  --   --  3.6*  --   CREATININE 4.14*  --   --   --  4.30*  TROPONINIHS 51* 49*  --   --   --     Estimated Creatinine Clearance: 15.5 mL/min (A) (by C-G formula based on SCr of 4.3 mg/dL (H)).   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   Conductive hearing loss    uses hearing aids   Dysrhythmia    A-fib   History of blood transfusion 03/24/2023   Hypothyroidism    Intraspinal abscess    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   Nonischemic cardiomyopathy (HCC)    Psychosexual dysfunction with inhibited sexual excitement    S/P mitral valve replacement    Syncope and collapse    Unspecified sleep apnea    last sleep study 11/07, uses cpap   Ventricular tachycardia (HCC)    VT (ventricular tachycardia) (HCC)      Assessment: Austin [name] with AFib and mMVR on warfarin PTA admitted with weakness and dyspnea. Pharmacy consulted for warfarin. INR on admit was elevated at 4.2.  10/17: INR this morning 3.6, remains elevated. Will continue to hold warfarin.  Hgb low at 7.4, thought to be due to chronic renal disease. No signs of bleeding noted.   Goal of Therapy:  INR 3-3.5 Monitor platelets by anticoagulation protocol: Yes   Plan:  Continue to hold warfarin Check INR daily with am labs  Enos Fling, PharmD PGY-1 Acute Care Pharmacy Resident 05/12/2023 2:53 PM

## 2023-05-12 NOTE — Assessment & Plan Note (Addendum)
Elevated TSH, continue with levothyroxine.

## 2023-05-12 NOTE — ED Notes (Signed)
Pt 2 assist stand to transfer to chair. Pt then transferred to hospital bed.

## 2023-05-12 NOTE — Progress Notes (Signed)
Progress Note   Patient: Austin [name] NFA:[id number] DOB: 05/18/1941 DOA: 05/11/2023     1 DOS: the patient was seen and examined on 05/12/2023   Brief hospital course: Austin [name] was admitted to the hospital with the working diagnosis of heart failure decompensation.  82 yo male with the past medical history of heart failure, atrial fibrillation, sp mitral valve replacement, CKD and hypothyroidism who presented with dyspnea. Recent neuro stimulator removal due to pseudomonas infection 04/12/23.  Reported 2 days of worsening dyspnea, associated with lower extremity edema and orthopnea. Worsening weakness right lower extremity. On his initial physical examination his blood pressure was 92/73, HR 84, RR 17 and 02 saturation 99%, lungs with no wheezing or rhonchi, heart with S1 and S2 present and regular, abdomen with no distention and positive lower extremity edema.   Na 132, K 6,2 Cl 97, bicarbonate 22, glucose 105 bun 48 cr 4.14 Mg 2.6  AST 99, ALT 62 BNP 1,224  High sensitive troponin 51 and 49  Wbc 9,6 hgb 8,0 plt 178  INR 4,2  TSH 12.9  Urine analysis SG 1,009, negative protein, negative leukocytes and negative hgb.   Head CT negative for hemorrhage, mas effect of midline shift. Chronic small vessel ischemic changes of the white matter and atrophy. 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  Hypodensity at the medial left temporal lobe potentially due to artifact though appearance an axial views makes if difficult to exclude acute to subacute infarct.   Chest radiograph with hypoinflation, cardiomegaly with bilateral hilar vascular congestion, no infiltrates, pacemaker defibrillator in the right hemithorax with one right atrial lead, one right and one left ventricular leads.   EKG 84 bpm, left axis deviation, interventricular conduction delay, qtc 612, atrial and ventricular paced rhythm with no significant ST segment or T wave changes.     Assessment and  Plan: * Acute on chronic systolic CHF (congestive heart failure) (HCC) 03/2023 echocardiogram with reduced LV systolic function EF 20%, global hypokinesis, RV systolic function with mild reduction, moderate TR, (limited).   Volume status has improved.  Patient had 160 mg IV furosemide this am and 40 mg IV yesterday.  Systolic blood pressure 94, 100, 95 mmHg.   Plan to hold pm dose of furosemide.  Continue close blood pressure monitoring.  Limited pharmacologic therapy due to hypotension.     Atrial fibrillation, chronic (HCC) Patient with paced rhythm.  Continue amiodarone for rate control. Holding warfarin due to supra therapeutic INR.   Acquired hypothyroidism Elevated TSH, resume levothyroxine.   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   K is trending down to 5.5  Plan to hold on diuretic therapy for now and re check renal function and electrolytes today. Monitor urine output.   Anemia of chronic renal disease.  Follow up on Hgb, currently no indication for PRBC transfusion.  Iron panel with no deficiency.        Subjective: patient is feeling better, dyspnea and edema are improving, no chest pain, left lower extremity weakness in improving   Physical Exam: Vitals:   05/12/23 0645 05/12/23 0700 05/12/23 0757 05/12/23 0949  BP: 108/68 105/72    Pulse: 83 84    Resp: (!) 24 13    Temp:   (!) 96.5 F (35.8 C) (!) 97.5 F (36.4 C)  TempSrc:   Rectal Oral  SpO2: 100% 96%    Weight:      Height:  Neurology awake and alert, follows commands and answers to questions, strength is preserved upper and lower extremities ENT with mild pallor Cardiovascular with S1 and S2 present and regular, positive valve click, no gallops or rubs, no murmurs No JVD Trace lower extremities edema, with ted hose in place Respiratory with no rales or wheezing, no rhonchi Abdomen with no distention  Left chest wall  drain in place, clean wounds.  Data Reviewed:    Family Communication: no family at the bedside   Disposition: Status is: Inpatient Remains inpatient appropriate because: monitor renal function and electrolytes   Planned Discharge Destination: Skilled nursing facility      Author: Coralie Keens, MD 05/12/2023 9:58 AM  For on call review [email or web address].

## 2023-05-12 NOTE — Progress Notes (Signed)
Heart Failure Navigator Progress Note  Assessed for Heart & Vascular TOC clinic readiness.  Patient does not meet criteria due to Advanced Heart Failure Team patient of Dr. McLean.   Navigator will sign off at this time.   Mahreen Schewe, BSN, RN Heart Failure Nurse Navigator Secure Chat Only   

## 2023-05-12 NOTE — ED Notes (Signed)
ED TO INPATIENT HANDOFF REPORT  ED Nurse Name and Phone #: Alvino Chapel [phone]  S Name/Age/Gender Austin [name] 82 y.o. male Room/Bed: 046C/046C  Code Status   Code Status: Full Code  Home/SNF/Other Nursing Home Patient oriented to: self, place, time, and situation Is this baseline? 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  Patient has had a recent chest wall infection.  End stage renal failure, heart failure, ventricular and atrial pacemaker, Paced @ 80 BMP.  22 g L AC 111/73 HR 80 CBG 144 O2 99 RA   Allergies No Known Allergies  Level of Care/Admitting Diagnosis ED Disposition     ED Disposition  Admit   Condition  --   Comment  Hospital [facility] [[id number]]  Level of Care: Telemetry Cardiac [[id number]]  May admit patient to [facility] or [facility] if equivalent level of care is available:: No  Covid Evaluation: Asymptomatic - no recent exposure (last 10 days) testing not required  Diagnosis: Heart failure [facility]) [[id number]]  Admitting Physician: Coralie Keens [[id number]]  Attending Physician: Coralie Keens [[id number]]  Certification:: I certify this patient will need inpatient services for at least 2 midnights          B Medical/Surgery History 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    cx by infection, explantation11/12 & reimplant 1/13   CHF (congestive heart failure) (HCC)    Chronic  kidney disease 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   Conductive hearing loss    uses hearing aids   Dysrhythmia    A-fib   History of blood transfusion 03/24/2023   Hypothyroidism    Intraspinal abscess    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   Nonischemic cardiomyopathy (HCC)    Psychosexual dysfunction with inhibited sexual excitement    S/P mitral valve replacement    Syncope and collapse    Unspecified sleep apnea    last sleep study 11/07, uses cpap   Ventricular tachycardia (HCC)    VT (ventricular tachycardia) (HCC)    Past Surgical History:  Procedure Laterality Date   BIV ICD GENERATOR CHANGEOUT N/A 05/04/2017   Procedure: BiV ICD Generator Changeout;  Surgeon: Marinus Maw, MD;  Location: [facility] INVASIVE CV LAB;  Service: Cardiovascular;  Laterality: N/A;   CARDIAC CATHETERIZATION  04/24/2002   CARDIAC VALVE REPLACEMENT     CARDIOVERSION  03/09/2012   Procedure: CARDIOVERSION;  Surgeon: Marinus Maw, MD;  Location: [facility] ENDOSCOPY;  Service: Cardiovascular;  Laterality: N/A;   CARDIOVERSION N/A 11/22/2013   Procedure: CARDIOVERSION;  Surgeon: Thurmon Fair, MD;  Location: [facility] ENDOSCOPY;  Service: Cardiovascular;  Laterality: N/A;   dental implants     ELECTROPHYSIOLOGIC STUDY N/A 09/15/2015   Procedure: V Tach Ablation;  Surgeon: Marinus Maw, MD;  Location: [facility] INVASIVE CV LAB;  Service: Cardiovascular;  Laterality: N/A;   Evacution of epidural lumbar epidural abscess  07/26/1997   INCISION AND DRAINAGE OF  WOUND Left 03/25/2023   Procedure: Washout of Chest Wall Hematoma;  Surgeon: Nada Libman, MD;  Location: [facility] OR;  Service: Vascular;  Laterality: Left;   INCISION AND DRAINAGE OF WOUND N/A 03/29/2023   Procedure: IRRIGATION AND DEBRIDEMENT  CHEST WALL;  Surgeon: Nada Libman, MD;  Location: [facility] OR;  Service: Vascular;  Laterality: N/A;   INSERT / REPLACE / REMOVE PACEMAKER  11/23/2008   IR US GUIDE BX ASP/DRAIN  04/30/2023   MITRAL VALVE  REPLACEMENT     w #[id number]   PERMANENT PACEMAKER INSERTION N/A 08/05/2011   Procedure: PERMANENT PACEMAKER INSERTION;  Surgeon: Marinus Maw, MD;  Location: [facility] CATH LAB;  Service: Cardiovascular;  Laterality: N/A;   REMOVAL OF BAROSTIM N/A 04/12/2023   Procedure: REMOVAL OF [name];  Surgeon: Nada Libman, MD;  Location: [facility] OR;  Service: Vascular;  Laterality: N/A;   RIGHT HEART CATH N/A 02/26/2021   Procedure: RIGHT HEART CATH;  Surgeon: Laurey Morale, MD;  Location: [facility] INVASIVE CV LAB;  Service: Cardiovascular;  Laterality: N/A;   THYROIDECTOMY     TONSILLECTOMY     V-TACH ABLATION  07/30/2021   in CE   VALVE REPLACEMENT  07/26/1998     A IV Location/Drains/Wounds Patient Lines/Drains/Airways Status     Active Line/Drains/Airways     Name Placement date Placement time Site Days   Peripheral IV 05/11/23 20 G Right Antecubital 05/11/23  1818  Antecubital  1   Peripheral IV 05/12/23 22 G Left;Posterior Hand 05/12/23  0841  Hand  less than 1   Closed System Drain 1 Left;Superior Chest 12 Fr. 04/30/23  1244  Chest  12   External Urinary Catheter 05/11/23  2036  --  1   Wound / Incision (Open or Dehisced) 05/08/23 Skin tear Shoulder Left skin tear to left deltoid area 05/08/23  --  Shoulder  4            Intake/Output Last 24 hours  Intake/Output Summary (Last 24 hours) at 05/12/2023 1445 Last data filed at 05/12/2023 0951 Gross per 24 hour  Intake 14.47 ml  Output 300 ml  Net -285.53 ml    Labs/Imaging Results for orders placed or performed during the hospital encounter of 05/11/23 (from the past 48 hour(s))  Comprehensive metabolic panel     Status: Abnormal   Collection Time: 05/11/23  6:32 PM  Result Value Ref Range   Sodium 132 (L) 135 - 145 mmol/L   Potassium 6.2 (H) 3.5 - 5.1 mmol/L   Chloride 97 (L) 98 - 111 mmol/L   CO2 22 22 - 32 mmol/L   Glucose, Bld 105 (H) 70 - 99 mg/dL    Comment: Glucose reference range applies only to samples taken after  fasting for at least 8 hours.   BUN 48 (H) 8 - 23 mg/dL   Creatinine, Ser 1.61 (H) 0.61 - 1.24 mg/dL   Calcium 8.3 (L) 8.9 - 10.3 mg/dL   Total Protein 5.3 (L) 6.5 - 8.1 g/dL   Albumin 2.9 (L) 3.5 - 5.0 g/dL   AST 99 (H) 15 - 41 U/L   ALT 62 (H) 0 - 44 U/L   Alkaline Phosphatase 121 38 - 126 U/L   Total Bilirubin 1.1 0.3 - 1.2 mg/dL   GFR, Estimated 14 (L) >60 mL/min    Comment: (NOTE) Calculated using the CKD-EPI Creatinine Equation (2021)    Anion gap 13 5 - 15    Comment: Performed at [facility], [address] [place], [place] [address]  CBC with Differential     Status: Abnormal   Collection Time: 05/11/23  6:32 PM  Result Value Ref Range   WBC 9.6 4.0 - 10.5 K/uL   RBC 2.59 (L) 4.22 - 5.81 MIL/uL   Hemoglobin 8.0 (L) 13.0 - 17.0 g/dL   HCT 60.4 (L) 54.0 - 98.1 %   MCV 100.0 80.0 - 100.0 fL   MCH 30.9 26.0 - 34.0 pg   MCHC 30.9 30.0 - 36.0 g/dL   RDW 19.1 (H) 47.8 - 29.5 %   Platelets 178 150 - 400 K/uL   nRBC 0.2 0.0 - 0.2 %   Neutrophils Relative % 77 %   Neutro Abs 7.3 1.7 - 7.7 K/uL   Lymphocytes Relative 5 %   Lymphs Abs 0.5 (L) 0.7 - 4.0 K/uL   Monocytes Relative 12 %   Monocytes Absolute 1.2 (H) 0.1 - 1.0 K/uL   Eosinophils Relative 1 %   Eosinophils Absolute 0.1 0.0 - 0.5 K/uL   Basophils Relative 0 %   Basophils Absolute 0.0 0.0 - 0.1 K/uL   Immature Granulocytes 5 %   Abs Immature Granulocytes 0.50 (H) 0.00 - 0.07 K/uL    Comment: Performed at [facility], [address], [place], [place] [address]  Troponin I (High Sensitivity)     Status: Abnormal   Collection Time: 05/11/23  6:32 PM  Result Value Ref Range   Troponin I (High Sensitivity) 51 (H) <18 ng/L    Comment: (NOTE) Elevated high sensitivity troponin I (hsTnI) values and significant  changes across serial measurements may suggest ACS but many other  chronic and acute conditions are known to elevate hsTnI results.  Refer to the "Links" section for chest pain algorithms and  additional  guidance. Performed at [facility], [address], [place], [place] [address]   Brain natriuretic peptide     Status: Abnormal   Collection Time: 05/11/23  6:32 PM  Result Value Ref Range   B Natriuretic Peptide 1,224.2 (H) 0.0 - 100.0 pg/mL    Comment: Performed at [facility], [address] [place], [place] [address]  Protime-INR     Status: Abnormal   Collection Time: 05/11/23  6:32 PM  Result Value Ref Range   Prothrombin Time 40.5 (H) 11.4 - 15.2 seconds   INR 4.2 (HH) 0.8 - 1.2    Comment: REPEATED TO VERIFY CRITICAL RESULT CALLED TO, READ BACK BY AND VERIFIED WITH: RACHEL BOYD RN @1932  ON 05/11/23 BY MAB MT (NOTE) INR goal varies based on device and disease states. Performed at [facility] Lab, [address], [place], [place] [address]   Magnesium     Status: Abnormal   Collection Time: 05/11/23  6:32 PM  Result Value Ref Range   Magnesium 2.6 (H) 1.7 - 2.4 mg/dL    Comment: HEMOLYSIS AT THIS LEVEL MAY AFFECT RESULT Performed at [facility], [address], [place], [place] [address]   Troponin I (High Sensitivity)     Status: Abnormal   Collection Time: 05/11/23  8:22 PM  Result Value Ref Range   Troponin I (High Sensitivity) 49 (H) <18 ng/L    Comment: (NOTE) Elevated high sensitivity troponin I (hsTnI) values and significant  changes across serial measurements may suggest ACS but many other  chronic and acute conditions are known to elevate hsTnI results.  Refer to the "Links" section for  chest pain algorithms and additional  guidance. Performed at [facility], [address] [place], [place] [address]   Potassium     Status: Abnormal   Collection Time: 05/11/23 10:20 PM  Result Value Ref Range   Potassium 5.5 (H) 3.5 - 5.1 mmol/L    Comment: Performed at [facility] Lab, [address], [place], [place] [address]  Vitamin B12     Status: Abnormal   Collection Time: 05/11/23 10:20 PM  Result Value Ref Range    Vitamin B-12 997 (H) 180 - 914 pg/mL    Comment: (NOTE) This assay is not validated for testing neonatal or myeloproliferative syndrome specimens for Vitamin B12 levels. Performed at [facility], [address] [place], [place] [address]   TSH     Status: Abnormal   Collection Time: 05/11/23 10:20 PM  Result Value Ref Range   TSH 12.925 (H) 0.350 - 4.500 uIU/mL    Comment: Performed by a 3rd Generation assay with a functional sensitivity of <=0.01 uIU/mL. Performed at [facility] Lab, [address], [place], [place] [address]   Urinalysis, Routine w reflex microscopic -Urine, Clean Catch     Status: None   Collection Time: 05/12/23  1:25 AM  Result Value Ref Range   Color, Urine YELLOW YELLOW   APPearance CLEAR CLEAR   Specific Gravity, Urine 1.009 1.005 - 1.030   pH 6.0 5.0 - 8.0   Glucose, UA NEGATIVE NEGATIVE mg/dL   Hgb urine dipstick NEGATIVE NEGATIVE   Bilirubin Urine NEGATIVE NEGATIVE   Ketones, ur NEGATIVE NEGATIVE mg/dL   Protein, ur NEGATIVE NEGATIVE mg/dL   Nitrite NEGATIVE NEGATIVE   Leukocytes,Ua NEGATIVE NEGATIVE    Comment: Performed at [facility], [address], [place], [place] [address]  CBC with Differential/Platelet     Status: Abnormal   Collection Time: 05/12/23  4:26 AM  Result Value Ref Range   WBC 11.4 (H) 4.0 - 10.5 K/uL   RBC 2.41 (L) 4.22 - 5.81 MIL/uL   Hemoglobin 7.4 (L) 13.0 - 17.0 g/dL   HCT 13.0 (L) 86.5 - 78.4 %   MCV 99.2 80.0 - 100.0 fL   MCH 30.7 26.0 - 34.0 pg   MCHC 31.0 30.0 - 36.0 g/dL   RDW 69.6 (H) 29.5 - 28.4 %   Platelets 159 150 - 400 K/uL   nRBC 0.4 (H) 0.0 - 0.2 %   Neutrophils Relative % 80 %   Neutro Abs 9.1 (H) 1.7 - 7.7 K/uL   Lymphocytes Relative 4 %   Lymphs Abs 0.5 (L) 0.7 - 4.0 K/uL   Monocytes Relative 12 %   Monocytes Absolute 1.4 (H) 0.1 - 1.0 K/uL   Eosinophils Relative 1 %   Eosinophils Absolute 0.1 0.0 - 0.5 K/uL   Basophils Relative 0 %   Basophils Absolute 0.0 0.0 - 0.1 K/uL    Immature Granulocytes 3 %   Abs Immature Granulocytes 0.35 (H) 0.00 - 0.07 K/uL    Comment: Performed at [facility] Lab, [address], [place], [place] [address]  Protime-INR     Status: Abnormal   Collection Time: 05/12/23  7:43 AM  Result Value Ref Range   Prothrombin Time 36.3 (H) 11.4 - 15.2 seconds   INR 3.6 (H) 0.8 - 1.2    Comment: (NOTE) INR goal varies based on device and disease states. Performed at [facility] Lab, [address] [place], [place] [address]  Basic metabolic panel     Status: Abnormal   Collection Time: 05/12/23 11:44 AM  Result Value Ref Range   Sodium 133 (L) 135 - 145 mmol/L   Potassium 5.2 (H) 3.5 - 5.1 mmol/L   Chloride 100 98 - 111 mmol/L   CO2 23 22 - 32 mmol/L   Glucose, Bld 131 (H) 70 - 99 mg/dL    Comment: Glucose reference range applies only to samples taken after fasting for at least 8 hours.   BUN 49 (H) 8 - 23 mg/dL   Creatinine, Ser 4.09 (H) 0.61 - 1.24 mg/dL   Calcium 7.9 (L) 8.9 - 10.3 mg/dL   GFR, Estimated 13 (L) >60 mL/min    Comment: (NOTE) Calculated using the CKD-EPI Creatinine Equation (2021)    Anion gap 10 5 - 15    Comment: Performed at [facility] Lab, [address] [place], [place] [address]   *Note: Due to a large number of results and/or encounters for the requested time period, some results have not been displayed. A complete set of results can be found in Results Review.   CT Head Wo Contrast  Result Date: 05/11/2023 CLINICAL DATA:  Mental status change EXAM: CT HEAD WITHOUT CONTRAST TECHNIQUE: Contiguous axial images were obtained from the base of the skull through the vertex without intravenous contrast. RADIATION DOSE REDUCTION: This exam was performed according to the departmental dose-optimization program which includes automated exposure control, adjustment of the mA and/or kV according to patient size and/or use of iterative reconstruction technique. COMPARISON:  CT brain 05/04/2023 FINDINGS: Brain:  No hemorrhage or intracranial mass. 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 Chronic small vessel ischemic changes of the white matter and atrophy. Question hypodensity within the medial left temporal lobe, series 2 image 9 and 10, but appearance on sagittal views suggest potentially related to artifact. Vascular: No hyperdense vessels. Vertebral and carotid vascular calcification Skull: Normal. Negative for fracture or focal lesion. Sinuses/Orbits: No acute finding. Other: None IMPRESSION: 1. Negative for hemorrhage, mass, mass effect or midline shift 2. Chronic small vessel ischemic changes of the white matter and atrophy. 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 3. Hypodensity at the medial left temporal lobe potentially due to artifact though appearance on axial views makes it difficult to exclude acute to subacute infarct. Electronically Signed   By: Jasmine Pang M.D.   On: 05/11/2023 21:55   DG Chest Port 1 View  Result Date: 05/11/2023 CLINICAL DATA:  Weakness EXAM: PORTABLE CHEST 1 VIEW COMPARISON:  05/04/2023, 04/09/2023 FINDINGS: Post sternotomy changes and valve prosthesis. Right-sided multi lead pacing device as before. Low lung volumes. Cardiomegaly. Chronic elevation right diaphragm. Mild central congestion. No consolidation. Possible small left effusion. Removal of previously noted left upper chest drainage catheter IMPRESSION: Cardiomegaly with mild central congestion. Possible small left effusion. Electronically Signed   By: Jasmine Pang M.D.   On: 05/11/2023 21:50    Pending Labs Unresulted Labs (From admission, onward)     Start     Ordered   05/13/23 0500  Protime-INR  Daily,   R      05/12/23 0511   05/12/23 0507  Creatinine, urine, random  Add-on,   AD        05/12/23 0506            Vitals/Pain Today's Vitals   05/12/23 1015 05/12/23 1130 05/12/23 1300 05/12/23 1411  BP: 93/72 98/61 100/73 (!) 85/61   Pulse: 83  85 85 95  Resp: (!) 21 14 (!) 24 18  Temp:    97.8 F (36.6 C)  TempSrc:    Oral  SpO2: 98% 99% 100% 92%  Weight:      Height:      PainSc:        Isolation Precautions No active isolations  Medications Medications  acetaminophen (TYLENOL) tablet 650 mg (has no administration in time range)    Or  acetaminophen (TYLENOL) suppository 650 mg (has no administration in time range)  melatonin tablet 3 mg (has no administration in time range)  ondansetron (ZOFRAN) injection 4 mg (has no administration in time range)  amiodarone (PACERONE) tablet 400 mg (400 mg Oral Given 05/12/23 0950)  levothyroxine (SYNTHROID) tablet 88 mcg (88 mcg Oral Given 05/12/23 0602)  furosemide (LASIX) injection 40 mg (40 mg Intravenous Given 05/11/23 2015)  sodium zirconium cyclosilicate (LOKELMA) packet 10 g (10 g Oral Given 05/11/23 2206)  insulin aspart (novoLOG) injection 5 Units (5 Units Intravenous Given 05/11/23 2213)    And  dextrose 50 % solution 50 mL (50 mLs Intravenous Given 05/11/23 2214)    Mobility walks with device     Focused Assessments Pulmonary Assessment Handoff:  Lung sounds:  clear  O2 Device: Room Air      R Recommendations: See Admitting Provider Note  Report given to:   Additional Notes: family at bedside

## 2023-05-12 NOTE — ED Notes (Signed)
Lunch tray delivered. Family at bedside.

## 2023-05-12 NOTE — Assessment & Plan Note (Addendum)
AKI, Hyperkalemia and hyponatremia.   Renal function with serum cr at 3,15 with K at 3,0 and serum bicarbonate at 30. Na 140.  Plan to continue de-congestion with torsemide.   Anemia of chronic renal disease.  SP 1 unit PRBC transfusion with follow up Hgb 8.5  Iron panel with no deficiency.

## 2023-05-12 NOTE — Assessment & Plan Note (Addendum)
Patient with paced rhythm.  Continue amiodarone for rate control. Anticoagulation with warfarin.  INR today is 2,7

## 2023-05-12 NOTE — Evaluation (Signed)
Physical Therapy Evaluation Patient Details Name: Austin [name] MRN: [id number] DOB: [birth year] Today's Date: 05/12/2023  History of Present Illness  Pt is 82 yo presenting to [facility] ED from [facility] due to shortness of breathe and found to have acute on chronic biventricular systolic heart failure. Pt has multiple recent hospitalizations. PMH: Mechanical MV s/p MV endocarditis, VT, NICM, CHB and PAF/AFL, CKD, CHF, syncope, sleep apnea  Clinical Impression  Pt is presenting below baseline level of functioning. Pt reports that up to a few days a go he was able to ambulate at Mod I to the dining room and perform ADL's at Mod I. Currently pt is Min A for supine>sitting and Mod A for sitting>supine due to LE weakness. Pt is Min A for sit to stand from elevated surface and was unable to progress gait due to functional weakness in the LLE and mild buckling at the knee when standing over 30 seconds. Due to pt current functional status, home set up and available assistance at home recommending skilled physical therapy services < 3 hours/day on discharge from acute care hospital setting in order to decrease risk for falls, immobility, injury and re-hospitalization. Pt tolerated treatment session well.         If plan is discharge home, recommend the following: A little help with walking and/or transfers;A little help with bathing/dressing/bathroom;Assistance with cooking/housework;Assist for transportation   Can travel by private vehicle   Yes    Equipment Recommendations Other (comment) (defer to post acute)     Functional Status Assessment Patient has had a recent decline in their functional status and demonstrates the ability to make significant improvements in function in a reasonable and predictable amount of time.     Precautions / Restrictions Precautions Precautions: Fall Precaution Comments: JP drain L chest Restrictions Weight Bearing Restrictions: No      Mobility  Bed  Mobility Overal bed mobility: Needs Assistance Bed Mobility: Supine to Sit, Sit to Supine     Supine to sit: Min assist Sit to supine: Mod assist   General bed mobility comments: Min A for trunk to get to mid line from supine and Mod A for bil LE for sitting to supine due to LLE weakness    Transfers Overall transfer level: Needs assistance Equipment used: Rolling walker (2 wheels) Transfers: Sit to/from Stand Sit to Stand: Min assist           General transfer comment: Min A for standing Edge of stretcher. Stood 2x with < 30 seconds due to mild buckling at the L knee.    Ambulation/Gait     Pre-gait activities: attemped stepping forward/back and side to side at EOB, pt demonstrates functional weakness in the LLE with mild buckling at the L knee in standing greater than 30 seconds with tremors. General Gait Details: did not progress due to LLE weakness.       Balance Overall balance assessment: Needs assistance Sitting-balance support: Feet supported Sitting balance-Leahy Scale: Fair Sitting balance - Comments: slight posterior then anterior movement   Standing balance support: Bilateral upper extremity supported, During functional activity, Reliant on assistive device for balance Standing balance-Leahy Scale: Poor       Pertinent Vitals/Pain Pain Assessment Pain Assessment: Faces Faces Pain Scale: No hurt    Home Living Family/patient expects to be discharged to:: Skilled nursing facility Living Arrangements: Spouse/significant other Available Help at Discharge: Family Type of Home: Independent living facility Home Access: Level entry  Home Layout: One level Home Equipment: Rollator (4 wheels)      Prior Function Prior Level of Function : Independent/Modified Independent             Mobility Comments: used RW for ambulating to dining room ADLs Comments: Mod I for ADL"s/IADL     Extremity/Trunk Assessment   Upper Extremity  Assessment Upper Extremity Assessment: Generalized weakness    Lower Extremity Assessment Lower Extremity Assessment: Generalized weakness    Cervical / Trunk Assessment Cervical / Trunk Assessment: Normal  Communication   Communication Communication: No apparent difficulties  Cognition Arousal: Alert Behavior During Therapy: WFL for tasks assessed/performed Overall Cognitive Status: Within Functional Limits for tasks assessed     General Comments: Pt is aware of person, place, situation and understands why he is in the hospital        General Comments General comments (skin integrity, edema, etc.): JP drain intact        Assessment/Plan    PT Assessment Patient needs continued PT services  PT Problem List Decreased strength;Decreased activity tolerance;Decreased balance;Decreased mobility;Decreased knowledge of precautions;Cardiopulmonary status limiting activity;Pain       PT Treatment Interventions DME instruction;Gait training;Functional mobility training;Therapeutic activities;Therapeutic exercise;Balance training;Neuromuscular re-education;Patient/family education    PT Goals (Current goals can be found in the Care Plan section)  Acute Rehab PT Goals Patient Stated Goal: improve mobility PT Goal Formulation: With patient Time For Goal Achievement: 2023-06-17 Potential to Achieve Goals: Fair    Frequency Min 1X/week        AM-PAC PT "6 Clicks" Mobility  Outcome Measure Help needed turning from your back to your side while in a flat bed without using bedrails?: A Little Help needed moving from lying on your back to sitting on the side of a flat bed without using bedrails?: A Little Help needed moving to and from a bed to a chair (including a wheelchair)?: A Little Help needed standing up from a chair using your arms (e.g., wheelchair or bedside chair)?: A Little Help needed to walk in hospital room?: A Lot Help needed climbing 3-5 steps with a railing? :  Total 6 Click Score: 15    End of Session Equipment Utilized During Treatment: Gait belt (at axilla to avoid incisions and drain) Activity Tolerance: Patient tolerated treatment well Patient left: with call bell/phone within reach;in bed Nurse Communication: Mobility status PT Visit Diagnosis: Other abnormalities of gait and mobility (R26.89);Muscle weakness (generalized) (M62.81)    Time: 1478-2956 PT Time Calculation (min) (ACUTE ONLY): 30 min   Charges:   PT Evaluation $PT Eval Low Complexity: 1 Low PT Treatments $Therapeutic Activity: 8-22 mins PT General Charges $$ ACUTE PT VISIT: 1 Visit         Harrel Carina, DPT, CLT  Acute Rehabilitation Services Office: [phone] (Secure chat preferred)   Claudia Desanctis 05/12/2023, 11:35 AM

## 2023-05-13 DIAGNOSIS — N179 Acute kidney failure, unspecified: Secondary | ICD-10-CM | POA: Diagnosis not present

## 2023-05-13 DIAGNOSIS — I5023 Acute on chronic systolic (congestive) heart failure: Secondary | ICD-10-CM | POA: Diagnosis not present

## 2023-05-13 DIAGNOSIS — E039 Hypothyroidism, unspecified: Secondary | ICD-10-CM | POA: Diagnosis not present

## 2023-05-13 DIAGNOSIS — I482 Chronic atrial fibrillation, unspecified: Secondary | ICD-10-CM | POA: Diagnosis not present

## 2023-05-13 LAB — PROTIME-INR
INR: 3.7 — ABNORMAL HIGH (ref 0.8–1.2)
Prothrombin Time: 36.6 s — ABNORMAL HIGH (ref 11.4–15.2)

## 2023-05-13 LAB — BASIC METABOLIC PANEL
Anion gap: 14 (ref 5–15)
BUN: 53 mg/dL — ABNORMAL HIGH (ref 8–23)
CO2: 26 mmol/L (ref 22–32)
Calcium: 8.3 mg/dL — ABNORMAL LOW (ref 8.9–10.3)
Chloride: 95 mmol/L — ABNORMAL LOW (ref 98–111)
Creatinine, Ser: 4.4 mg/dL — ABNORMAL HIGH (ref 0.61–1.24)
GFR, Estimated: 13 mL/min — ABNORMAL LOW (ref >60)
Glucose, Bld: 102 mg/dL — ABNORMAL HIGH (ref 70–99)
Potassium: 4.8 mmol/L (ref 3.5–5.1)
Sodium: 135 mmol/L (ref 135–145)

## 2023-05-13 MED ORDER — MIDODRINE HCL 5 MG PO TABS
5.0000 mg | ORAL_TABLET | Freq: Three times a day (TID) | ORAL | Status: DC
  Administered 2023-05-13 – 2023-05-18 (×15): 5 mg via ORAL
  Filled 2023-05-13 (×15): qty 1

## 2023-05-13 MED ORDER — WARFARIN - PHARMACIST DOSING INPATIENT: Freq: Every day | Status: DC

## 2023-05-13 NOTE — Consult Note (Signed)
[facility]  [facility] Inpatient Consult   05/13/2023  Nuncio Rahill Dec 01, 1940 [id number]  [facility] [[facility]]  Accountable Care Organization [ACO] Patient: [employer] Medicare  Primary Care Provider:  Laurey Morale, MD [listed in electronic medical record ]is NOT a PCP. However, met with patient and he states he sees the PCP at [facility] for primary care. However, patient is followed by the Advanced HF team  Patient was reviewed for less than 30 days readmission extreme high risk score to check for post hospital  barriers to care.  Patient was screened for hospitalization and on behalf of [facility] /[facility] Care Coordination to assess for post hospital community care needs.  Patient is being recommended to return to skilled nursing facility level of care for post hospital transition. Met with patient and wife at bedside.  Patient states he is awaiting lab work and confirms PCP from [facility], "Quincy Simmonds."   *Spoke with inpatient [facility] social worker  and explained that patient is likely to need insurance authorization to return to SNF level of care.  Plan:   Patient needs for transitional care needs for returning to post facility care coordination are to be met at that level of care.  For questions or referrals, please contact:   Charlesetta Shanks, RN, BSN, CCM [place]  [facility], [facility] Liaison Direct Dial: [phone] or secure chat Website: [email or web address]           .

## 2023-05-13 NOTE — Plan of Care (Signed)
CHL Tonsillectomy/Adenoidectomy, Postoperative PEDS care plan entered in error.

## 2023-05-13 NOTE — Progress Notes (Signed)
PHARMACY - ANTICOAGULATION CONSULT NOTE  Pharmacy Consult for warfarin Indication:  mMVR + AFib  No Known Allergies  Patient Measurements: Height: 6\' 1"  (185.4 cm) Weight: 88.6 kg (195 lb 5.2 oz) IBW/kg (Calculated) : 79.9  Vital Signs: Temp: 97.9 F (36.6 C) (10/18 1122) Temp Source: Oral (10/18 1122) BP: 92/52 (10/18 1122) Pulse Rate: 85 (10/18 1122)  Labs: Recent Labs    05/11/23 1832 05/11/23 2022 05/12/23 0426 05/12/23 0743 05/12/23 1144 05/13/23 0438  HGB 8.0*  --  7.4*  --   --   --   HCT 25.9*  --  23.9*  --   --   --   PLT 178  --  159  --   --   --   LABPROT 40.5*  --   --  36.3*  --  36.6*  INR 4.2*  --   --  3.6*  --  3.7*  CREATININE 4.14*  --   --   --  4.30* 4.40*  TROPONINIHS 51* 49*  --   --   --   --     Estimated Creatinine Clearance: 15.1 mL/min (A) (by C-G formula based on SCr of 4.4 mg/dL (H)).   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   Conductive hearing loss    uses hearing aids   Dysrhythmia    A-fib   History of blood transfusion 03/24/2023   Hypothyroidism    Intraspinal abscess    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   Nonischemic cardiomyopathy (HCC)    Psychosexual dysfunction with inhibited sexual excitement    S/P mitral valve replacement    Syncope and collapse    Unspecified sleep apnea    last sleep study 11/07, uses cpap   Ventricular tachycardia (HCC)    VT (ventricular tachycardia) (HCC)      Assessment: [name] with AFib and mMVR on warfarin PTA admitted with weakness and dyspnea. Pharmacy consulted for warfarin. INR on 10/15 was 4.3, and warfarin/Lovenox was held. Sounds  like last dose of warfarin was 10/14. INR on admit 10/16, remained elevated at 4.2.   INR this morning 3.7, remains slightly elevated but stable from yesterday. PO intake has been minimal, if any. Per RN, does not have much of an appetite. Hgb low at 7.4 on 10/17, thought to be due to chronic renal disease. No signs of bleeding noted.  Of note, patient was on Lovenox bridge with warfarin 10/7-14.  PTA warfarin dose: 2.5 mg daily  Goal of Therapy:  INR goal  3-3.5 Monitor platelets by anticoagulation protocol: Yes   Plan:  No warfarin tonight Monitor daily INR, CBC, clinical course, s/sx of bleed, PO intake/diet, Drug-Drug Interactions   Thank you for allowing pharmacy to be a part of this patient's care.   Signe Colt, PharmD 05/13/2023 1:38 PM  **Pharmacist phone directory can be found on [email or web address] listed under [facility]**

## 2023-05-13 NOTE — Evaluation (Signed)
Occupational Therapy Evaluation Patient Details Name: Austin [name] MRN: [id number] DOB: March 19, 1941 Today's Date: 05/13/2023   History of Present Illness Pt is 82 yo presenting to [facility] ED from [facility] due to shortness of breathe and found to have acute on chronic biventricular systolic heart failure. Pt has multiple recent hospitalizations. PMH: Mechanical MV s/p MV endocarditis, VT, NICM, CHB and PAF/AFL, CKD, CHF, syncope, sleep apnea   Clinical Impression   PTA, pt lives with spouse at [facility], typically ambulatory with Rollator and able to manage ADLs but increasingly difficult due to progressive weakness. Pt presents with significant deficits in strength, endurance and dynamic standing balance. Pt requires Min A to stand with L knee buckling noted, unable to stand longer than 10 seconds d/t fatigue so unable to mobilize in room. Pt requires Min A for UB ADL and up to Mod A for LB ADLs d/t deficits. Provided UE HEP and handout to review in next sessions. Recommend continued inpatient follow up therapy, <3 hours/day at DC (at [facility]).      If plan is discharge home, recommend the following: A lot of help with walking and/or transfers;A lot of help with bathing/dressing/bathroom    Functional Status Assessment  Patient has had a recent decline in their functional status and demonstrates the ability to make significant improvements in function in a reasonable and predictable amount of time.  Equipment Recommendations  Other (comment) (RW)    Recommendations for Other Services       Precautions / Restrictions Precautions Precautions: Fall Precaution Comments: JP drain L chest Restrictions Weight Bearing Restrictions: No      Mobility Bed Mobility               General bed mobility comments: in chair on entry    Transfers Overall transfer level: Needs assistance Equipment used: Rolling walker (2 wheels) Transfers: Sit to/from Stand Sit to  Stand: Min assist           General transfer comment: x 2 standing from recliner, good hand placement. L knee blocking/support needed due to buckling with transitional movement. once up, able to march in place but unable to stand longer than 10 seconds due to fatigue      Balance Overall balance assessment: Needs assistance Sitting-balance support: Feet supported Sitting balance-Leahy Scale: Fair     Standing balance support: Bilateral upper extremity supported, During functional activity, Reliant on assistive device for balance Standing balance-Leahy Scale: Poor                             ADL either performed or assessed with clinical judgement   ADL Overall ADL's : Needs assistance/impaired Eating/Feeding: Independent;Sitting   Grooming: Set up;Sitting   Upper Body Bathing: Minimal assistance;Sitting   Lower Body Bathing: Moderate assistance;Sit to/from stand;Sitting/lateral leans   Upper Body Dressing : Minimal assistance;Sitting   Lower Body Dressing: Moderate assistance;Sitting/lateral leans;Sit to/from stand Lower Body Dressing Details (indicate cue type and reason): able to bend to reach to feet but difficulty standing for longer than a few seconds d/t endurance deficits Toilet Transfer: Minimal assistance;Stand-pivot;BSC/3in1;Rolling walker (2 wheels)   Toileting- Clothing Manipulation and Hygiene: Sitting/lateral lean;Sit to/from stand;Moderate assistance               Vision Ability to See in Adequate Light: 0 Adequate Patient Visual Report: No change from baseline Vision Assessment?: No apparent visual deficits     Perception  Praxis         Pertinent Vitals/Pain Pain Assessment Pain Assessment: Faces Faces Pain Scale: Hurts a little bit Pain Location: L upper LE muscle group Pain Descriptors / Indicators: Grimacing Pain Intervention(s): Monitored during session, Limited activity within patient's tolerance      Extremity/Trunk Assessment Upper Extremity Assessment Upper Extremity Assessment: Generalized weakness;Right hand dominant   Lower Extremity Assessment Lower Extremity Assessment: Defer to PT evaluation   Cervical / Trunk Assessment Cervical / Trunk Assessment: Normal   Communication Communication Communication: No apparent difficulties   Cognition Arousal: Alert Behavior During Therapy: WFL for tasks assessed/performed Overall Cognitive Status: Within Functional Limits for tasks assessed                                       General Comments  Wife at bedside    Exercises     Shoulder Instructions      Home Living Family/patient expects to be discharged to:: Private residence Living Arrangements: Spouse/significant other Available Help at Discharge: Family Type of Home: Other(Comment) (ALF now per pt. [facility]) Home Access: Level entry     Home Layout: One level     Bathroom Shower/Tub: Producer, television/film/video: Handicapped height Bathroom Accessibility: Yes How Accessible: Accessible via wheelchair;Accessible via walker Home Equipment: Rollator (4 wheels);Shower seat;Grab bars - tub/shower;Wheelchair - manual          Prior Functioning/Environment Prior Level of Function : Independent/Modified Independent             Mobility Comments: recently using rollator for mobility though increasingly unsteady ADLs Comments: typically able to manage ADLs but increasingly difficult with weakness        OT Problem List: Decreased strength;Decreased activity tolerance;Impaired balance (sitting and/or standing)      OT Treatment/Interventions: Self-care/ADL training;Therapeutic activities;Balance training;Patient/family education    OT Goals(Current goals can be found in the care plan section) Acute Rehab OT Goals Patient Stated Goal: increase strength, be able to walk again OT Goal Formulation: With patient Time For Goal  Achievement: 05/27/23 Potential to Achieve Goals: Good ADL Goals Pt Will Perform Lower Body Dressing: with contact guard assist;sit to/from stand Pt Will Transfer to Toilet: with min assist;ambulating Pt/caregiver will Perform Home Exercise Program: Increased strength;With theraband;Independently;With written HEP provided;Both right and left upper extremity Additional ADL Goal #1: Pt to stand > 5 min during ADLs/mobility without rest break  OT Frequency: Min 1X/week    Co-evaluation              AM-PAC OT "6 Clicks" Daily Activity     Outcome Measure Help from another person eating meals?: None Help from another person taking care of personal grooming?: A Little Help from another person toileting, which includes using toliet, bedpan, or urinal?: A Lot Help from another person bathing (including washing, rinsing, drying)?: A Lot Help from another person to put on and taking off regular upper body clothing?: A Little Help from another person to put on and taking off regular lower body clothing?: A Lot 6 Click Score: 16   End of Session Equipment Utilized During Treatment: Gait belt;Rolling walker (2 wheels) Nurse Communication: Mobility status  Activity Tolerance: Patient tolerated treatment well Patient left: in chair;with call bell/phone within reach;with family/visitor present  OT Visit Diagnosis: Unsteadiness on feet (R26.81);Muscle weakness (generalized) (M62.81)  Time: 1610-9604 OT Time Calculation (min): 21 min Charges:  OT General Charges $OT Visit: 1 Visit OT Evaluation $OT Eval Moderate Complexity: 1 Mod  Bradd Canary, OTR/L Acute Rehab Services Office: [phone]   Lorre Munroe 05/13/2023, 12:21 PM

## 2023-05-13 NOTE — Progress Notes (Signed)
Progress Note   Patient: Austin [name] XBM:[id number] DOB: [birth year] DOA: 05/11/2023     2 DOS: the patient was seen and examined on 05/13/2023   Brief hospital course: Mr. Ursino was admitted to the hospital with the working diagnosis of heart failure decompensation.  82 yo male with the past medical history of heart failure, atrial fibrillation, sp mitral valve replacement, CKD and hypothyroidism who presented with dyspnea. Recent neuro stimulator removal due to pseudomonas infection 04/12/23.  Reported 2 days of worsening dyspnea, associated with lower extremity edema and orthopnea. Worsening weakness right lower extremity. On his initial physical examination his blood pressure was 92/73, HR 84, RR 17 and 02 saturation 99%, lungs with no wheezing or rhonchi, heart with S1 and S2 present and regular, abdomen with no distention and positive lower extremity edema.   Na 132, K 6,2 Cl 97, bicarbonate 22, glucose 105 bun 48 cr 4.14 Mg 2.6  AST 99, ALT 62 BNP 1,224  High sensitive troponin 51 and 49  Wbc 9,6 hgb 8,0 plt 178  INR 4,2  TSH 12.9  Urine analysis SG 1,009, negative protein, negative leukocytes and negative hgb.   Head CT negative for hemorrhage, mas effect of midline shift. Chronic small vessel ischemic changes of the white matter and atrophy. 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  Hypodensity at the medial left temporal lobe potentially due to artifact though appearance an axial views makes if difficult to exclude acute to subacute infarct.   Chest radiograph with hypoinflation, cardiomegaly with bilateral hilar vascular congestion, no infiltrates, pacemaker defibrillator in the right hemithorax with one right atrial lead, one right and one left ventricular leads.   EKG 84 bpm, left axis deviation, interventricular conduction delay, qtc 612, atrial and ventricular paced rhythm with no significant ST segment or T wave changes.   10/18 improvement in  volume status, renal function not yet stable.  Assessment and Plan: * Acute on chronic systolic CHF (congestive heart failure) (HCC) 03/2023 echocardiogram with reduced LV systolic function EF 20%, global hypokinesis, RV systolic function with mild reduction, moderate TR, (limited).   Volume status has improved.  Systolic blood pressure 93 to 97 mmHg.   Limited pharmacologic therapy due to hypotension.  Will add midodrine for blood pressure support.   Atrial fibrillation, chronic (HCC) Patient with paced rhythm.  Continue amiodarone for rate control. Anticoagulation with warfarin, inr today is 3,7   Acquired hypothyroidism Elevated TSH, continue with levothyroxine.   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   Renal function today with serum cr up to 4,4 with K at 4,8 and serum bicarbonate at 26. Na 135.    Anemia of chronic renal disease.  Hgb is 7,4   Iron panel with no deficiency.         Subjective: Patient with improvement in dyspnea, no chest pain or edema. No able to sleep well at night.   Physical Exam: Vitals:   05/13/23 0019 05/13/23 0432 05/13/23 0746 05/13/23 1122  BP: 91/74 97/66 92/66  (!) 92/52  Pulse: 84 84 86 85  Resp: 18 17 18 20   Temp: (!) 97.3 F (36.3 C) (!) 97.5 F (36.4 C) 98 F (36.7 C) 97.9 F (36.6 C)  TempSrc: Oral Oral Oral Oral  SpO2: 100% 97% 100% 92%  Weight:  88.6 kg    Height:       Neurology awake and alert ENT with mild pallor  Cardiovascular with S1 and  S2 present and regular, positive systolic murmur at the right lower sternal border Respiratory with no rales or wheezing, no rhonchi Abdomen with no distention  No lower extremity edema  Data Reviewed:    Family Communication: I spoke with patient's wife at the bedside, we talked in detail about patient's condition, plan of care and prognosis and all questions were addressed.   Disposition: Status is:  Inpatient Remains inpatient appropriate because: transfer to SNF possible in 24 hrs   Planned Discharge Destination: Skilled nursing facility     Author: Coralie Keens, MD 05/13/2023 3:31 PM  For on call review [email or web address].

## 2023-05-13 NOTE — TOC Initial Note (Addendum)
Transition of Care [facility]) - Initial/Assessment Note    Patient Details  Name: Austin [name] MRN: [id number] Date of Birth: 28-Dec-1940  Transition of Care [facility]) CM/SW Contact:    Nicanor Bake Phone Number: [phone] 05/13/2023, 1:45 PM  Clinical Narrative:   [facility] CSW attempted to meet with pt at bedside. Pt had someone in the room. CSW will follow up with pt at a more appropriate time.   CSW called Florentina Addison at [facility] [phone] and left a VM to inquire if Berkley Harvey was needed for pt and ask about SNF bed availability. Will start auth closer to being medically ready if needed.              TOC will continue following.      Patient Goals and CMS Choice            Expected Discharge Plan and Services                                              Prior Living Arrangements/Services                       Activities of Daily Living   ADL Screening (condition at time of admission) Independently performs ADLs?: No Does the patient have a NEW difficulty with bathing/dressing/toileting/self-feeding that is expected to last >3 days?: Yes (Initiates electronic notice to provider for possible OT consult) Does the patient have a NEW difficulty with getting in/out of bed, walking, or climbing stairs that is expected to last >3 days?: Yes (Initiates electronic notice to provider for possible PT consult) Does the patient have a NEW difficulty with communication that is expected to last >3 days?: No Is the patient deaf or have difficulty hearing?: No Does the patient have difficulty seeing, even when wearing glasses/contacts?: No Does the patient have difficulty concentrating, remembering, or making decisions?: No  Permission Sought/Granted                  Emotional Assessment              Admission diagnosis:  Acute on chronic diastolic heart failure (HCC) [I50.33] Hyperkalemia [E87.5] Supratherapeutic INR [R79.1] Heart failure (HCC)  [I50.9] Acute kidney injury superimposed on chronic kidney disease (HCC) [N17.9, N18.9] Acute on chronic congestive heart failure, unspecified heart failure type (HCC) [I50.9] Patient Active Problem List   Diagnosis Date Noted   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) 05/12/2023   Hyperkalemia 05/12/2023   Generalized weakness 05/12/2023   Acute on chronic diastolic heart failure (HCC) 05/11/2023   Postoperative seroma involving circulatory system after other circulatory system procedure 05/01/2023   Hyponatremia 04/27/2023   Insomnia 04/27/2023   Heart failure (HCC) 04/06/2023   Postprocedural seroma of skin and subcutaneous tissue following other procedure 03/23/2023   Callous ulcer, limited to breakdown of skin (HCC) 09/27/2022   OSA on CPAP 09/27/2022   Dyspnea 07/05/2022   Anemia due to zinc deficiency 05/25/2022   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   Stage 3b chronic kidney disease (HCC) 05/17/2022   Anemia of chronic disease 05/17/2022   Need for immunization against influenza 05/17/2022   Chronic anticoagulation 10/14/2018   Ventricular tachycardia (HCC) 10/12/2018   Neuropathy 08/25/2018   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   Nonischemic cardiomyopathy (  HCC)    Supratherapeutic INR 01/10/2016   S/P ablation of ventricular arrhythmia 01/10/2016   Acquired hypothyroidism 10/27/2015   H/O mitral valve replacement with mechanical valve    Anticoagulation goal of INR 2.5 to 3.5 08/20/2013   Cardiac device, implant, or graft infection or inflammation (HCC) 06/28/2011   ICD (implantable cardioverter-defibrillator) in place 06/02/2009   Acute on chronic systolic CHF (congestive heart failure) (HCC) 04/15/2009   ERECTILE DYSFUNCTION 11/27/2008   HEARING LOSS 11/27/2008   Atrial fibrillation, chronic (HCC) 11/27/2008   SLEEP APNEA 11/27/2008   PCP:  Laurey Morale, MD Pharmacy:  No  Pharmacies Listed    Social Determinants of Health (SDOH) Social History: SDOH Screenings   Food Insecurity: No Food Insecurity (05/12/2023)  Housing: Low Risk  (05/12/2023)  Transportation Needs: No Transportation Needs (05/12/2023)  Utilities: Not At Risk (05/12/2023)  Alcohol Screen: Low Risk  (03/08/2023)  Depression (PHQ2-9): Low Risk  (03/08/2023)  Financial Resource Strain: Low Risk  (03/08/2023)  Physical Activity: Insufficiently Active (03/08/2023)  Social Connections: Moderately Integrated (03/08/2023)  Stress: No Stress Concern Present (03/08/2023)  Tobacco Use: Low Risk  (05/12/2023)  Health Literacy: Adequate Health Literacy (03/08/2023)   SDOH Interventions:     Readmission Risk Interventions     No data to display

## 2023-05-13 NOTE — Plan of Care (Signed)
  Problem: Education: Goal: Understanding of post-operative needs will improve Outcome: Progressing Goal: Individualized Educational Video(s) 05/13/2023 1535 by Reynold Bowen, RN Outcome: Progressing 05/13/2023 1535 by Reynold Bowen, RN Outcome: Progressing   Problem: Clinical Measurements: Goal: Postoperative complications will be avoided or minimized Outcome: Progressing   Problem: Respiratory: Goal: Will regain and/or maintain adequate ventilation Outcome: Progressing

## 2023-05-13 NOTE — Progress Notes (Signed)
Mobility Specialist Progress Note:    05/13/23 1040  Mobility  Activity Transferred from bed to chair  Level of Assistance Moderate assist, patient does 50-74%  Assistive Device Front wheel walker  Distance Ambulated (ft) 3 ft  Range of Motion/Exercises Active;All extremities  Activity Response Tolerated well  Mobility Referral Yes  $Mobility charge 1 Mobility  Mobility Specialist Start Time (ACUTE ONLY) 1010  Mobility Specialist Stop Time (ACUTE ONLY) 1021  Mobility Specialist Time Calculation (min) (ACUTE ONLY) 11 min   Pt received in bed, eager to mobility. Family in room. Transferred pt B>C with RW and ModA +2. Tolerated well, asx throughout. Left pt in chair, all needs met, call bell in reach.   Feliciana Rossetti Mobility Specialist Please contact via Special educational needs teacher or  Rehab office at [phone]

## 2023-05-14 DIAGNOSIS — E039 Hypothyroidism, unspecified: Secondary | ICD-10-CM | POA: Diagnosis not present

## 2023-05-14 DIAGNOSIS — I5023 Acute on chronic systolic (congestive) heart failure: Secondary | ICD-10-CM | POA: Diagnosis not present

## 2023-05-14 DIAGNOSIS — I482 Chronic atrial fibrillation, unspecified: Secondary | ICD-10-CM | POA: Diagnosis not present

## 2023-05-14 DIAGNOSIS — N179 Acute kidney failure, unspecified: Secondary | ICD-10-CM | POA: Diagnosis not present

## 2023-05-14 LAB — BASIC METABOLIC PANEL
Anion gap: 20 — ABNORMAL HIGH (ref 5–15)
BUN: 55 mg/dL — ABNORMAL HIGH (ref 8–23)
CO2: 21 mmol/L — ABNORMAL LOW (ref 22–32)
Calcium: 9 mg/dL (ref 8.9–10.3)
Chloride: 94 mmol/L — ABNORMAL LOW (ref 98–111)
Creatinine, Ser: 4.22 mg/dL — ABNORMAL HIGH (ref 0.61–1.24)
GFR, Estimated: 14 mL/min — ABNORMAL LOW (ref >60)
Glucose, Bld: 86 mg/dL (ref 70–99)
Potassium: 4.6 mmol/L (ref 3.5–5.1)
Sodium: 135 mmol/L (ref 135–145)

## 2023-05-14 LAB — PROTIME-INR
INR: 2.7 — ABNORMAL HIGH (ref 0.8–1.2)
Prothrombin Time: 28.8 s — ABNORMAL HIGH (ref 11.4–15.2)

## 2023-05-14 MED ORDER — WARFARIN SODIUM 2.5 MG PO TABS
2.5000 mg | ORAL_TABLET | Freq: Once | ORAL | Status: AC
  Administered 2023-05-14: 2.5 mg via ORAL
  Filled 2023-05-14: qty 1

## 2023-05-14 NOTE — Progress Notes (Signed)
PHARMACY - ANTICOAGULATION CONSULT NOTE  Pharmacy Consult for warfarin Indication:  mMVR + AFib  No Known Allergies  Patient Measurements: Height: 6\' 1"  (185.4 cm) Weight: 86.1 kg (189 lb 13.1 oz) IBW/kg (Calculated) : 79.9  Vital Signs: Temp: 97.3 F (36.3 C) (10/19 0837) Temp Source: Oral (10/19 0837) BP: 93/54 (10/19 0837) Pulse Rate: 85 (10/19 0700)  Labs: Recent Labs    05/11/23 1832 05/11/23 2022 05/12/23 0426 05/12/23 0743 05/12/23 1144 05/13/23 0438 05/14/23 0303  HGB 8.0*  --  7.4*  --   --   --   --   HCT 25.9*  --  23.9*  --   --   --   --   PLT 178  --  159  --   --   --   --   LABPROT 40.5*  --   --  36.3*  --  36.6* 28.8*  INR 4.2*  --   --  3.6*  --  3.7* 2.7*  CREATININE 4.14*  --   --   --  4.30* 4.40* 4.22*  TROPONINIHS 51* 49*  --   --   --   --   --     Estimated Creatinine Clearance: 15.8 mL/min (A) (by C-G formula based on SCr of 4.22 mg/dL (H)).   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   Conductive hearing loss    uses hearing aids   Dysrhythmia    A-fib   History of blood transfusion 03/24/2023   Hypothyroidism    Intraspinal abscess    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   Nonischemic cardiomyopathy (HCC)    Psychosexual dysfunction with inhibited sexual excitement    S/P mitral valve replacement    Syncope and collapse    Unspecified sleep apnea    last sleep study 11/07, uses cpap   Ventricular tachycardia (HCC)    VT (ventricular tachycardia) (HCC)      Assessment: [name] with AFib and mMVR on warfarin PTA admitted with weakness and dyspnea. Pharmacy consulted for warfarin. INR on  10/15 was 4.3, and warfarin/Lovenox was held. Sounds like last dose of warfarin was 10/14. INR on admit 10/16, remained elevated at 4.2. Of note, patient was on Lovenox bridge with warfarin 10/7-14. PTA warfarin dose: 2.5 mg daily  10/19 AM: INR this morning 2.7 (subtherapeutic). PO intake has been minimal recently but improved. Per nursing team, he is eating 100% of meals. Hgb low at 7.4 on 10/17, thought to be due to chronic renal disease. No signs of bleeding noted.   Goal of Therapy:  INR goal  3-3.5 Monitor platelets by anticoagulation protocol: Yes   Plan:  Warfarin 2.5 mg x1 tonight Monitor daily INR, CBC, clinical course, s/sx of bleed, PO intake/diet, Drug-Drug Interactions  Thank you for allowing pharmacy to be a part of this patient's care.   Roslyn Smiling, PharmD PGY1 Pharmacy Resident 05/14/2023 8:59 AM

## 2023-05-14 NOTE — Progress Notes (Signed)
Progress Note   Patient: Austin [name] OZH:[id number] DOB: 1940-12-17 DOA: 05/11/2023     3 DOS: the patient was seen and examined on 05/14/2023   Brief hospital course: Mr. Wohlfarth was admitted to the hospital with the working diagnosis of heart failure decompensation.  82 yo male with the past medical history of heart failure, atrial fibrillation, sp mitral valve replacement, CKD and hypothyroidism who presented with dyspnea. Recent neuro stimulator removal due to pseudomonas infection 04/12/23.  Reported 2 days of worsening dyspnea, associated with lower extremity edema and orthopnea. Worsening weakness right lower extremity. On his initial physical examination his blood pressure was 92/73, HR 84, RR 17 and 02 saturation 99%, lungs with no wheezing or rhonchi, heart with S1 and S2 present and regular, abdomen with no distention and positive lower extremity edema.   Na 132, K 6,2 Cl 97, bicarbonate 22, glucose 105 bun 48 cr 4.14 Mg 2.6  AST 99, ALT 62 BNP 1,224  High sensitive troponin 51 and 49  Wbc 9,6 hgb 8,0 plt 178  INR 4,2  TSH 12.9  Urine analysis SG 1,009, negative protein, negative leukocytes and negative hgb.   Head CT negative for hemorrhage, mas effect of midline shift. Chronic small vessel ischemic changes of the white matter and atrophy. 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  Hypodensity at the medial left temporal lobe potentially due to artifact though appearance an axial views makes if difficult to exclude acute to subacute infarct.   Chest radiograph with hypoinflation, cardiomegaly with bilateral hilar vascular congestion, no infiltrates, pacemaker defibrillator in the right hemithorax with one right atrial lead, one right and one left ventricular leads.   EKG 84 bpm, left axis deviation, interventricular conduction delay, qtc 612, atrial and ventricular paced rhythm with no significant ST segment or T wave changes.   10/18 improvement in  volume status, renal function not yet stable. 10/19 renal function stable, pending insurance authorization in order to return to SNF.   Assessment and Plan: * Acute on chronic systolic CHF (congestive heart failure) (HCC) 03/2023 echocardiogram with reduced LV systolic function EF 20%, global hypokinesis, RV systolic function with mild reduction, moderate TR, (limited).   Volume status has improved.  Reported weight loss of 7 Kg since admission.  Systolic blood pressure 93 to 629 mmHg.   Limited pharmacologic therapy due to hypotension.  Continue with midodrine for blood pressure support.  Resume diuretic therapy in am.   Atrial fibrillation, chronic (HCC) Patient with paced rhythm.  Continue amiodarone for rate control. Anticoagulation with warfarin.  INR today is 2,7    Acquired hypothyroidism Elevated TSH, continue with levothyroxine.   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   Renal function today with serum cr at 4,2 with k at 4,6 and serum bicarbonate at 21,  Na 135   Anemia of chronic renal disease.  Follow up Hgb in am.  Iron panel with no deficiency.        Subjective: Patient is feeling better, dyspnea and edema have improved, no chest pain.,   Physical Exam: Vitals:   05/14/23 0700 05/14/23 0755 05/14/23 0837 05/14/23 1148  BP: (!) 93/54 (!) 99/59 (!) 93/54 102/68  Pulse: 85   84  Resp:  20  20  Temp: (!) 97.3 F (36.3 C)  (!) 97.3 F (36.3 C) 98 F (36.7 C)  TempSrc: Oral  Oral Oral  SpO2: 99% 98%  91%  Weight:  Height:       Neurology awake and alert ENT with mild pallor Cardiovascular with S1 and S2 present and regular with no gallops, rubs or murmurs Respiratory with no rales or wheezing, no rhonchi Abdomen with no distention  Trace lower extremity edema  Data Reviewed:    Family Communication: no family at the bedside   Disposition: Status is:  Inpatient Remains inpatient appropriate because: pending transfer to SNF.   Planned Discharge Destination: Home     Author: Coralie Keens, MD 05/14/2023 11:55 AM  For on call review [email or web address].

## 2023-05-14 NOTE — Plan of Care (Signed)

## 2023-05-15 DIAGNOSIS — E039 Hypothyroidism, unspecified: Secondary | ICD-10-CM | POA: Diagnosis not present

## 2023-05-15 DIAGNOSIS — I5023 Acute on chronic systolic (congestive) heart failure: Secondary | ICD-10-CM | POA: Diagnosis not present

## 2023-05-15 DIAGNOSIS — N179 Acute kidney failure, unspecified: Secondary | ICD-10-CM | POA: Diagnosis not present

## 2023-05-15 DIAGNOSIS — I482 Chronic atrial fibrillation, unspecified: Secondary | ICD-10-CM | POA: Diagnosis not present

## 2023-05-15 LAB — BASIC METABOLIC PANEL WITH GFR
Anion gap: 11 (ref 5–15)
BUN: 50 mg/dL — ABNORMAL HIGH (ref 8–23)
CO2: 26 mmol/L (ref 22–32)
Calcium: 8.1 mg/dL — ABNORMAL LOW (ref 8.9–10.3)
Chloride: 99 mmol/L (ref 98–111)
Creatinine, Ser: 3.73 mg/dL — ABNORMAL HIGH (ref 0.61–1.24)
GFR, Estimated: 16 mL/min — ABNORMAL LOW (ref >60)
Glucose, Bld: 91 mg/dL (ref 70–99)
Potassium: 3.5 mmol/L (ref 3.5–5.1)
Sodium: 136 mmol/L (ref 135–145)

## 2023-05-15 LAB — CBC
HCT: 23.2 % — ABNORMAL LOW (ref 39.0–52.0)
Hemoglobin: 7.2 g/dL — ABNORMAL LOW (ref 13.0–17.0)
MCH: 29.3 pg (ref 26.0–34.0)
MCHC: 31 g/dL (ref 30.0–36.0)
MCV: 94.3 fL (ref 80.0–100.0)
Platelets: 155 K/uL (ref 150–400)
RBC: 2.46 MIL/uL — ABNORMAL LOW (ref 4.22–5.81)
RDW: 18.6 % — ABNORMAL HIGH (ref 11.5–15.5)
WBC: 8.7 K/uL (ref 4.0–10.5)
nRBC: 0.5 % — ABNORMAL HIGH (ref 0.0–0.2)

## 2023-05-15 LAB — PROTIME-INR
INR: 2.3 — ABNORMAL HIGH (ref 0.8–1.2)
Prothrombin Time: 25.3 s — ABNORMAL HIGH (ref 11.4–15.2)

## 2023-05-15 MED ORDER — TORSEMIDE 20 MG PO TABS
80.0000 mg | ORAL_TABLET | Freq: Every day | ORAL | Status: DC
  Administered 2023-05-15 – 2023-05-18 (×4): 80 mg via ORAL
  Filled 2023-05-15 (×4): qty 4

## 2023-05-15 MED ORDER — POLYETHYLENE GLYCOL 3350 17 G PO PACK
17.0000 g | PACK | Freq: Every day | ORAL | Status: DC
  Administered 2023-05-15 – 2023-05-18 (×3): 17 g via ORAL
  Filled 2023-05-15 (×4): qty 1

## 2023-05-15 MED ORDER — WARFARIN SODIUM 2.5 MG PO TABS
2.5000 mg | ORAL_TABLET | Freq: Once | ORAL | Status: AC
  Administered 2023-05-15: 2.5 mg via ORAL
  Filled 2023-05-15: qty 1

## 2023-05-15 NOTE — Progress Notes (Signed)
Mobility Specialist Progress Note:   05/15/23 1039  Mobility  Activity Transferred from bed to chair  Level of Assistance Contact guard assist, steadying assist  Assistive Device Front wheel walker  Distance Ambulated (ft) 3 ft  Activity Response Tolerated well  Mobility Referral Yes  $Mobility charge 1 Mobility  Mobility Specialist Start Time (ACUTE ONLY) 0930  Mobility Specialist Stop Time (ACUTE ONLY) 0945  Mobility Specialist Time Calculation (min) (ACUTE ONLY) 15 min    Pt received EOB requesting to sit in chair. CG to stand and pivot to chair from bedside. Asymptomatic throughout. Pt left in chair with call bell in reach and all needs met. Chair alarm on.  Austin [name]  Mobility Specialist Please contact via [employer] Rehab office at [phone]

## 2023-05-15 NOTE — Plan of Care (Signed)

## 2023-05-15 NOTE — Progress Notes (Addendum)
Progress Note   Patient: Austin [name] ZOX:[id number] DOB: [birth year] DOA: 05/11/2023     4 DOS: the patient was seen and examined on 05/15/2023   Brief hospital course: Austin [name] was admitted to the hospital with the working diagnosis of heart failure decompensation.  82 yo male with the past medical history of heart failure, atrial fibrillation, sp mitral valve replacement, CKD and hypothyroidism who presented with dyspnea. Recent neuro stimulator removal due to pseudomonas infection 04/12/23.  Reported 2 days of worsening dyspnea, associated with lower extremity edema and orthopnea. Worsening weakness right lower extremity. On his initial physical examination his blood pressure was 92/73, HR 84, RR 17 and 02 saturation 99%, lungs with no wheezing or rhonchi, heart with S1 and S2 present and regular, abdomen with no distention and positive lower extremity edema.   Na 132, K 6,2 Cl 97, bicarbonate 22, glucose 105 bun 48 cr 4.14 Mg 2.6  AST 99, ALT 62 BNP 1,224  High sensitive troponin 51 and 49  Wbc 9,6 hgb 8,0 plt 178  INR 4,2  TSH 12.9  Urine analysis SG 1,009, negative protein, negative leukocytes and negative hgb.   Head CT negative for hemorrhage, mas effect of midline shift. Chronic small vessel ischemic changes of the white matter and atrophy. 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  Hypodensity at the medial left temporal lobe potentially due to artifact though appearance an axial views makes if difficult to exclude acute to subacute infarct.   Chest radiograph with hypoinflation, cardiomegaly with bilateral hilar vascular congestion, no infiltrates, pacemaker defibrillator in the right hemithorax with one right atrial lead, one right and one left ventricular leads.   EKG 84 bpm, left axis deviation, interventricular conduction delay, qtc 612, atrial and ventricular paced rhythm with no significant ST segment or T wave changes.   10/18 improvement in  volume status, renal function not yet stable. 10/19 renal function stable, pending insurance authorization in order to return to SNF.  10/20 pending transfer to SNF. Will check in am with vascular surgery for drain evaluation.  Assessment and Plan: * Acute on chronic systolic CHF (congestive heart failure) (HCC) 03/2023 echocardiogram with reduced LV systolic function EF 20%, global hypokinesis, RV systolic function with mild reduction, moderate TR, (limited).   Volume status has improved.  Reported weight loss of 7 Kg since admission.  Systolic blood pressure 106 to 102 mmHg.   Limited pharmacologic therapy due to hypotension.  Continue with midodrine for blood pressure support.  Resume diuretic therapy torsemide 80 mg daily.   Atrial fibrillation, chronic (HCC) Patient with paced rhythm.  Continue amiodarone for rate control. Anticoagulation with warfarin, pharmacy protocol.    Acquired hypothyroidism Elevated TSH, continue with levothyroxine.   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   Renal function stable with serum cr at 3,73, K is 3,5 and serum bicarbonate at 26 Na 136   Plan to resume diuretic therapy to prevent volume overload. Torsemide 80 mg po daily.   Anemia of chronic renal disease.  Follow up Hgb 7.2  Iron panel with no deficiency.        Subjective: Patient is feeling well, no chest pain, no edema, no PND or orthopnea   Physical Exam: Vitals:   05/15/23 0001 05/15/23 0440 05/15/23 0805 05/15/23 1128  BP: 98/67 102/67 99/61 106/78  Pulse: 83 84 84 84  Resp: 18 17 18 19   Temp: 97.9 F (36.6 C) (!) 97.3  F (36.3 C) 98.2 F (36.8 C) 97.8 F (36.6 C)  TempSrc: Oral Oral Oral Oral  SpO2: 97% 99% 100% 100%  Weight:  86.8 kg    Height:       Neurology awake and alert ENT with mild pallor Cardiovascular with S1 and S2 present and regular with no gallops, rubs or  murmurs Respiratory with no rales or wheezing, no rhonchi Abdomen with no distention No lower extremity edema  Data Reviewed:    Family Communication: no family at the bedside   Disposition: Status is: Inpatient Remains inpatient appropriate because: pending transfer to SNF  Planned Discharge Destination: Skilled nursing facility     Author: Coralie Keens, MD 05/15/2023 12:35 PM  For on call review [email or web address].

## 2023-05-15 NOTE — Progress Notes (Signed)
PHARMACY - ANTICOAGULATION CONSULT NOTE  Pharmacy Consult for warfarin Indication:  mMVR + AFib  No Known Allergies  Patient Measurements: Height: 6\' 1"  (185.4 cm) Weight: 86.8 kg (191 lb 5.8 oz) IBW/kg (Calculated) : 79.9  Vital Signs: Temp: 98.2 F (36.8 C) (10/20 0805) Temp Source: Oral (10/20 0805) BP: 99/61 (10/20 0805) Pulse Rate: 84 (10/20 0805)  Labs: Recent Labs    05/13/23 0438 05/14/23 0303 05/15/23 0246  HGB  --   --  7.2*  HCT  --   --  23.2*  PLT  --   --  155  LABPROT 36.6* 28.8* 25.3*  INR 3.7* 2.7* 2.3*  CREATININE 4.40* 4.22* 3.73*    Estimated Creatinine Clearance: 17.9 mL/min (A) (by C-G formula based on SCr of 3.73 mg/dL (H)).   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   Conductive hearing loss    uses hearing aids   Dysrhythmia    A-fib   History of blood transfusion 03/24/2023   Hypothyroidism    Intraspinal abscess    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   Nonischemic cardiomyopathy (HCC)    Psychosexual dysfunction with inhibited sexual excitement    S/P mitral valve replacement    Syncope and collapse    Unspecified sleep apnea    last sleep study 11/07, uses cpap   Ventricular tachycardia (HCC)    VT (ventricular tachycardia) (HCC)      Assessment: [name] with AFib and mMVR on warfarin PTA admitted with weakness and dyspnea. Pharmacy consulted for warfarin. INR on 10/15 was 4.3, and warfarin/Lovenox was held. Sounds like last dose of warfarin was 10/14. INR on admit 10/16, remained elevated at 4.2. Of note, patient was on Lovenox bridge with warfarin 10/7-14. PTA warfarin dose: 2.5 mg  daily  10/20 AM: INR this morning 2.3 (subtherapeutic). Most likely subtherapeutic d/t patients warfarin being held the last few days. Will need time to reach steady state. PO intake has been minimal recently but improved. Hgb low at 7.2, thought to be due to chronic renal disease. No signs of bleeding noted per nursing team. Will dose with PTA dose again today and reassess boosting/increasing dose tomorrow.   Goal of Therapy:  INR goal  3-3.5 Monitor platelets by anticoagulation protocol: Yes   Plan:  Warfarin 2.5 mg x1 tonight Monitor daily INR, CBC, clinical course, s/sx of bleed, PO intake/diet, Drug-Drug Interactions  Thank you for allowing pharmacy to be a part of this patient's care.   Roslyn Smiling, PharmD PGY1 Pharmacy Resident 05/15/2023 8:24 AM

## 2023-05-16 DIAGNOSIS — N179 Acute kidney failure, unspecified: Secondary | ICD-10-CM | POA: Diagnosis not present

## 2023-05-16 DIAGNOSIS — I482 Chronic atrial fibrillation, unspecified: Secondary | ICD-10-CM | POA: Diagnosis not present

## 2023-05-16 DIAGNOSIS — I5023 Acute on chronic systolic (congestive) heart failure: Secondary | ICD-10-CM | POA: Diagnosis not present

## 2023-05-16 DIAGNOSIS — E039 Hypothyroidism, unspecified: Secondary | ICD-10-CM | POA: Diagnosis not present

## 2023-05-16 LAB — PROTIME-INR
INR: 2.4 — ABNORMAL HIGH (ref 0.8–1.2)
Prothrombin Time: 26.3 s — ABNORMAL HIGH (ref 11.4–15.2)

## 2023-05-16 LAB — CBC
HCT: 23.4 % — ABNORMAL LOW (ref 39.0–52.0)
Hemoglobin: 7.1 g/dL — ABNORMAL LOW (ref 13.0–17.0)
MCH: 29.1 pg (ref 26.0–34.0)
MCHC: 30.3 g/dL (ref 30.0–36.0)
MCV: 95.9 fL (ref 80.0–100.0)
Platelets: 153 10*3/uL (ref 150–400)
RBC: 2.44 MIL/uL — ABNORMAL LOW (ref 4.22–5.81)
RDW: 18.1 % — ABNORMAL HIGH (ref 11.5–15.5)
WBC: 7.4 10*3/uL (ref 4.0–10.5)
nRBC: 0.3 % — ABNORMAL HIGH (ref 0.0–0.2)

## 2023-05-16 LAB — PREPARE RBC (CROSSMATCH)

## 2023-05-16 MED ORDER — SODIUM CHLORIDE 0.9% IV SOLUTION: Freq: Once | INTRAVENOUS | Status: AC

## 2023-05-16 MED ORDER — WARFARIN SODIUM 3 MG PO TABS
3.0000 mg | ORAL_TABLET | Freq: Once | ORAL | Status: AC
  Administered 2023-05-16: 3 mg via ORAL
  Filled 2023-05-16: qty 1

## 2023-05-16 MED ORDER — ENSURE ENLIVE PO LIQD
237.0000 mL | Freq: Two times a day (BID) | ORAL | Status: DC
  Administered 2023-05-17 – 2023-05-18 (×4): 237 mL via ORAL

## 2023-05-16 NOTE — Care Management Important Message (Signed)
Important Message  Patient Details  Name: Austin [name] MRN: [id number] Date of Birth: 1941-01-11   Important Message Given:  Yes - Medicare IM     Dorena Bodo 05/16/2023, 3:16 PM

## 2023-05-16 NOTE — [place] FL2 (Signed)
MEDICAID FL2 LEVEL OF CARE FORM     IDENTIFICATION  Patient Name: Austin [name] Birthdate: 1940/09/29 Sex: male Admission Date (Current Location): 05/11/2023  [facility] and [place] Number:  Producer, television/film/video and Address:  The . [facility], [address], [place], [place] [address]      Provider Number: [id number]  Attending Physician Name and Address:  Coralie Keens,*  Relative Name and Phone Number:  Ivin Poot [phone] [place] Dominion [phone]    Current Level of Care: Hospital Recommended Level of Care: Skilled Nursing Facility Prior Approval Number:    Date Approved/Denied:   PASRR Number: [id number]  Discharge Plan: SNF    Current Diagnoses: Patient Active Problem List   Diagnosis Date Noted   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) 05/12/2023   Hyperkalemia 05/12/2023   Generalized weakness 05/12/2023   Acute on chronic diastolic heart failure (HCC) 05/11/2023   Postoperative seroma involving circulatory system after other circulatory system procedure 05/01/2023   Hyponatremia 04/27/2023   Insomnia 04/27/2023   Heart failure (HCC) 04/06/2023   Postprocedural seroma of skin and subcutaneous tissue following other procedure 03/23/2023   Callous ulcer, limited to breakdown of skin (HCC) 09/27/2022   OSA on CPAP 09/27/2022   Dyspnea 07/05/2022   Anemia due to zinc deficiency 05/25/2022   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   Stage 3b chronic kidney disease (HCC) 05/17/2022   Anemia of chronic disease 05/17/2022   Need for immunization against influenza 05/17/2022   Chronic anticoagulation 10/14/2018   Ventricular tachycardia (HCC) 10/12/2018   Neuropathy 08/25/2018   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   Nonischemic cardiomyopathy (HCC)    Supratherapeutic INR 01/10/2016   S/P ablation of ventricular arrhythmia  01/10/2016   Acquired hypothyroidism 10/27/2015   H/O mitral valve replacement with mechanical valve    Anticoagulation goal of INR 2.5 to 3.5 08/20/2013   Cardiac device, implant, or graft infection or inflammation (HCC) 06/28/2011   ICD (implantable cardioverter-defibrillator) in place 06/02/2009   Acute on chronic systolic CHF (congestive heart failure) (HCC) 04/15/2009   ERECTILE DYSFUNCTION 11/27/2008   HEARING LOSS 11/27/2008   Atrial fibrillation, chronic (HCC) 11/27/2008   SLEEP APNEA 11/27/2008    Orientation RESPIRATION BLADDER Height & Weight     Self, Time, Situation, Place  Normal Continent Weight: 188 lb 1.6 oz (85.3 kg) Height:  6\' 1"  (185.4 cm)  BEHAVIORAL SYMPTOMS/MOOD NEUROLOGICAL BOWEL NUTRITION STATUS      Continent Diet (Please see discharge summary)  AMBULATORY STATUS COMMUNICATION OF NEEDS Skin   Limited Assist Verbally Other (Comment) (Abrasion,arm,chest,leg,Bil.,Ecchymosis,abdomen,arm,leg,Bil.,Scratch marks,arm,chest,leg,Bil.,Weeping,arm,Bil.,Wound/Incision LDAs,Wound/Incision open or dehisced,chest,L,upper,surgical wound well approximated with staples,Please see additional info)                       Personal Care Assistance Level of Assistance  Bathing, Feeding, Dressing Bathing Assistance: Limited assistance Feeding assistance: Independent Dressing Assistance: Limited assistance     Functional Limitations Info  Sight, Hearing, Speech Sight Info:  (Reading glasses) Hearing Info: Impaired Speech Info: Adequate    SPECIAL CARE FACTORS FREQUENCY  PT (By licensed PT), OT (By licensed OT)     PT Frequency: 5x min weekly OT Frequency: 5x min weekly            Contractures Contractures Info: Not present    Additional Factors Info  Code Status Code Status Info: FULL Allergies Info: NKA  Current Medications (05/16/2023):  This is the current hospital active medication list Current Facility-Administered Medications   Medication Dose Route Frequency Provider Last Rate Last Admin   0.9 %  sodium chloride infusion (Manually program via Guardrails IV Fluids)   Intravenous Once Arrien, York Ram, MD       acetaminophen (TYLENOL) tablet 650 mg  650 mg Oral Q6H PRN Howerter, Justin B, DO       Or   acetaminophen (TYLENOL) suppository 650 mg  650 mg Rectal Q6H PRN Howerter, Justin B, DO       amiodarone (PACERONE) tablet 400 mg  400 mg Oral Daily Howerter, Justin B, DO   400 mg at 05/16/23 1111   levothyroxine (SYNTHROID) tablet 88 mcg  88 mcg Oral QAC breakfast Howerter, Justin B, DO   88 mcg at 05/16/23 0522   melatonin tablet 3 mg  3 mg Oral QHS PRN Howerter, Justin B, DO   3 mg at 05/15/23 2134   midodrine (PROAMATINE) tablet 5 mg  5 mg Oral TID WC Arrien, York Ram, MD   5 mg at 05/16/23 0747   ondansetron (ZOFRAN) injection 4 mg  4 mg Intravenous Q6H PRN Howerter, Justin B, DO       polyethylene glycol (MIRALAX / GLYCOLAX) packet 17 g  17 g Oral Daily Coralie Keens, MD   17 g at 05/16/23 1111   torsemide (DEMADEX) tablet 80 mg  80 mg Oral Daily Arrien, York Ram, MD   80 mg at 05/16/23 1111   warfarin (COUMADIN) tablet 3 mg  3 mg Oral ONCE-1600 Pham, Minh Q, RPH-CPP       Warfarin - Pharmacist Dosing Inpatient   Does not apply q1600 Arrien, York Ram, MD   Given at 05/15/23 1605     Discharge Medications: Please see discharge summary for a list of discharge medications.  Relevant Imaging Results:  Relevant Lab Results:   Additional Information [id number] Wound/Incision open or dehisced skin tear,shoulder,Left,skin tear to left deltoid area  Delilah Shan, LCSWA

## 2023-05-16 NOTE — Progress Notes (Signed)
Mobility Specialist Progress Note:    05/16/23 1610  Mobility  Activity Ambulated with assistance in hallway  Level of Assistance Contact guard assist, steadying assist  Assistive Device Four wheel walker  Distance Ambulated (ft) 140 ft  Activity Response Tolerated well  Mobility Referral Yes  $Mobility charge 1 Mobility  Mobility Specialist Start Time (ACUTE ONLY) [id number]  Mobility Specialist Stop Time (ACUTE ONLY) 0936  Mobility Specialist Time Calculation (min) (ACUTE ONLY) 14 min   Pt received in bed, eager for mobility. Attempted to check O2 sat while ambulating, pulse ox was not registering pleth. Ambulated in hallway, no c/o SOB during session. Tolerated well, returned to room, and sitting up in chair. All needs met.   Feliciana Rossetti Mobility Specialist Please contact via Special educational needs teacher or  Rehab office at [phone]

## 2023-05-16 NOTE — TOC Initial Note (Addendum)
Transition of Care [facility]) - Initial/Assessment Note    Patient Details  Name: Austin [name] MRN: [id number] Date of Birth: 23-Feb-1941  [facility] CM/SW Contact:    Delilah Shan, LCSWA Phone Number: 05/16/2023, 11:02 AM  Clinical Narrative:                   CSW received consult for possible SNF placement at time of discharge. CSW spoke with patient regarding PT recommendation of SNF placement at time of discharge. Patient reports PTA he comes from [facility] short term. Patient expressed understanding of PT recommendation and is agreeable to SNF placement at time of discharge. Patient would like to return back to [facility] for short term rehab. CSW discussed insurance authorization process  CSW Lvm for Katie with [facility]. CSW awaiting call back to confirm SNF bed for patient. No further questions reported at this time. CSW to continue to follow and assist with discharge planning needs.   Insurance authorization started [employer] ID# [id number]. Insurance authorization currently pending.  CSW uploaded current PT note to Navi portal. Patients insurance authorization currently pending.  Expected Discharge Plan: Skilled Nursing Facility Barriers to Discharge: Continued Medical Work up   Patient Goals and CMS Choice Patient states their goals for this hospitalization and ongoing recovery are:: SNF CMS Medicare.gov Compare Post Acute Care list provided to:: Patient Choice offered to / list presented to : Patient      Expected Discharge Plan and Services In-house Referral: Clinical Social Work     Living arrangements for the past 2 months:  (from [facility] short term)                                      Prior Living Arrangements/Services Living arrangements for the past 2 months:  (from [facility] short term) Lives with:: Spouse Patient language and need for interpreter reviewed:: Yes Do you feel  safe going back to the place where you live?: No   SNF  Need for Family Participation in Patient Care: Yes (Comment) Care giver support system in place?: Yes (comment)   Criminal Activity/Legal Involvement Pertinent to Current Situation/Hospitalization: No - Comment as needed  Activities of Daily Living   ADL Screening (condition at time of admission) Independently performs ADLs?: No Does the patient have a NEW difficulty with bathing/dressing/toileting/self-feeding that is expected to last >3 days?: Yes (Initiates electronic notice to provider for possible OT consult) Does the patient have a NEW difficulty with getting in/out of bed, walking, or climbing stairs that is expected to last >3 days?: Yes (Initiates electronic notice to provider for possible PT consult) Does the patient have a NEW difficulty with communication that is expected to last >3 days?: No Is the patient deaf or have difficulty hearing?: No Does the patient have difficulty seeing, even when wearing glasses/contacts?: No Does the patient have difficulty concentrating, remembering, or making decisions?: No  Permission Sought/Granted Permission sought to share information with : Case Manager, Magazine features editor, Family Supports Permission granted to share information with : Yes, Verbal Permission Granted  Share Information with NAME: Morrie Sheldon and Fannie Knee  Permission granted to share info w AGENCY: SNF  Permission granted to share info w Relationship: Daughter and Spouse  Permission granted to share info w Contact Information: Daughter Morrie Sheldon [phone] [place] [phone]  Emotional Assessment Appearance:: Appears stated age Attitude/Demeanor/Rapport: Gracious  Affect (typically observed): Calm Orientation: : Oriented to Self, Oriented to Place, Oriented to  Time, Oriented to Situation Alcohol / Substance Use: Not Applicable Psych Involvement: No (comment)  Admission diagnosis:  Acute on chronic diastolic  heart failure (HCC) [T59.74] Hyperkalemia [E87.5] Supratherapeutic INR [R79.1] Heart failure (HCC) [I50.9] Acute kidney injury superimposed on chronic kidney disease (HCC) [N17.9, N18.9] Acute on chronic congestive heart failure, unspecified heart failure type (HCC) [I50.9] Patient Active Problem List   Diagnosis Date Noted   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) 05/12/2023   Hyperkalemia 05/12/2023   Generalized weakness 05/12/2023   Acute on chronic diastolic heart failure (HCC) 05/11/2023   Postoperative seroma involving circulatory system after other circulatory system procedure 05/01/2023   Hyponatremia 04/27/2023   Insomnia 04/27/2023   Heart failure (HCC) 04/06/2023   Postprocedural seroma of skin and subcutaneous tissue following other procedure 03/23/2023   Callous ulcer, limited to breakdown of skin (HCC) 09/27/2022   OSA on CPAP 09/27/2022   Dyspnea 07/05/2022   Anemia due to zinc deficiency 05/25/2022   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   Stage 3b chronic kidney disease (HCC) 05/17/2022   Anemia of chronic disease 05/17/2022   Need for immunization against influenza 05/17/2022   Chronic anticoagulation 10/14/2018   Ventricular tachycardia (HCC) 10/12/2018   Neuropathy 08/25/2018   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   Nonischemic cardiomyopathy (HCC)    Supratherapeutic INR 01/10/2016   S/P ablation of ventricular arrhythmia 01/10/2016   Acquired hypothyroidism 10/27/2015   H/O mitral valve replacement with mechanical valve    Anticoagulation goal of INR 2.5 to 3.5 08/20/2013   Cardiac device, implant, or graft infection or inflammation (HCC) 06/28/2011   ICD (implantable cardioverter-defibrillator) in place 06/02/2009   Acute on chronic systolic CHF (congestive heart failure) (HCC) 04/15/2009   ERECTILE DYSFUNCTION 11/27/2008   HEARING LOSS 11/27/2008   Atrial  fibrillation, chronic (HCC) 11/27/2008   SLEEP APNEA 11/27/2008   PCP:  Laurey Morale, MD Pharmacy:  No Pharmacies Listed    Social Determinants of Health (SDOH) Social History: SDOH Screenings   Food Insecurity: No Food Insecurity (05/12/2023)  Housing: Low Risk  (05/12/2023)  Transportation Needs: No Transportation Needs (05/12/2023)  Utilities: Not At Risk (05/12/2023)  Alcohol Screen: Low Risk  (03/08/2023)  Depression (PHQ2-9): Low Risk  (03/08/2023)  Financial Resource Strain: Low Risk  (03/08/2023)  Physical Activity: Insufficiently Active (03/08/2023)  Social Connections: Moderately Integrated (03/08/2023)  Stress: No Stress Concern Present (03/08/2023)  Tobacco Use: Low Risk  (05/12/2023)  Health Literacy: Adequate Health Literacy (03/08/2023)   SDOH Interventions:     Readmission Risk Interventions     No data to display

## 2023-05-16 NOTE — Progress Notes (Signed)
Physical Therapy Treatment Patient Details Name: Austin [name] MRN: [id number] DOB: 1941-03-04 Today's Date: 05/16/2023   History of Present Illness Pt is 82 yo presenting to [facility] ED from [facility] due to shortness of breath and found to have acute on chronic biventricular systolic heart failure. Pt has multiple recent hospitalizations. PMH: Mechanical MV s/p MV endocarditis, VT, NICM, CHB and PAF/AFL, CKD, CHF, syncope, sleep apnea    PT Comments  Pt reporting feeling better and less SOB, agreeable to PT session and eager to participate. Pt ambulatory for hallway distance with use of rollator and light steadying assist, pt with posterior bias with challenge. Pt with VSS on RA throughout, tolerates LE exercises well. Plan remains appropriate, will continue to follow.      If plan is discharge home, recommend the following: A little help with walking and/or transfers;A little help with bathing/dressing/bathroom;Assistance with cooking/housework;Assist for transportation   Can travel by private vehicle        Equipment Recommendations  Other (comment) (defer)    Recommendations for Other Services       Precautions / Restrictions Precautions Precautions: Fall Precaution Comments: JP drain L chest Restrictions Weight Bearing Restrictions: No     Mobility  Bed Mobility               General bed mobility comments: in chair on entry    Transfers Overall transfer level: Needs assistance Equipment used: Rolling walker (2 wheels) Transfers: Sit to/from Stand Sit to Stand: Contact guard assist           General transfer comment: for safety, slow to rise and cues for hand placement when rising and sitting. stand x6 from recliner    Ambulation/Gait Ambulation/Gait assistance: Min assist Gait Distance (Feet): 150 Feet Assistive device: Rollator (4 wheels) Gait Pattern/deviations: Step-through pattern, Decreased stride length, Trunk flexed Gait velocity:  decr     General Gait Details: assist to steady and correct posterior bias, struggles most with challenges to balance especially directional changes. HR stable, SPO2 99% RA post-gait   Stairs             Wheelchair Mobility     Tilt Bed    Modified Rankin (Stroke Patients Only)       Balance Overall balance assessment: Needs assistance Sitting-balance support: Feet supported Sitting balance-Leahy Scale: Fair     Standing balance support: Bilateral upper extremity supported, During functional activity, Reliant on assistive device for balance Standing balance-Leahy Scale: Poor                              Cognition Arousal: Alert Behavior During Therapy: WFL for tasks assessed/performed Overall Cognitive Status: Within Functional Limits for tasks assessed                                          Exercises General Exercises - Lower Extremity Long Arc Quad: AROM, Both, Seated, 15 reps Mini-Sqauts: AROM, Both, 5 reps    General Comments        Pertinent Vitals/Pain Pain Assessment Pain Assessment: No/denies pain Pain Intervention(s): Monitored during session, Limited activity within patient's tolerance    Home Living                          Prior Function  PT Goals (current goals can now be found in the care plan section) Acute Rehab PT Goals Patient Stated Goal: improve mobility PT Goal Formulation: With patient Time For Goal Achievement: 06-13-23 Potential to Achieve Goals: Fair Progress towards PT goals: Progressing toward goals    Frequency    Min 1X/week      PT Plan      Co-evaluation              AM-PAC PT "6 Clicks" Mobility   Outcome Measure  Help needed turning from your back to your side while in a flat bed without using bedrails?: A Little Help needed moving from lying on your back to sitting on the side of a flat bed without using bedrails?: A Little Help needed  moving to and from a bed to a chair (including a wheelchair)?: A Little Help needed standing up from a chair using your arms (Austin [name].g., wheelchair or bedside chair)?: A Little Help needed to walk in hospital room?: A Little Help needed climbing 3-5 steps with a railing? : A Lot 6 Click Score: 17    End of Session   Activity Tolerance: Patient tolerated treatment well Patient left: with call bell/phone within reach;in chair;with chair alarm set Nurse Communication: Mobility status PT Visit Diagnosis: Other abnormalities of gait and mobility (R26.89);Muscle weakness (generalized) (M62.81)     Time: 6295-2841 PT Time Calculation (min) (ACUTE ONLY): 20 min  Charges:    $Therapeutic Activity: 8-22 mins PT General Charges $$ ACUTE PT VISIT: 1 Visit                     Austin [name], PT DPT Acute Rehabilitation Services Secure Chat Preferred  Office [phone]    Austin [name] Austin [name] Austin [name] 05/16/2023, 2:36 PM

## 2023-05-16 NOTE — Progress Notes (Signed)
PHARMACY - ANTICOAGULATION CONSULT NOTE  Pharmacy Consult for warfarin Indication:  mMVR + AFib  No Known Allergies  Patient Measurements: Height: 6\' 1"  (185.4 cm) Weight: 85.3 kg (188 lb 1.6 oz) IBW/kg (Calculated) : 79.9  Vital Signs: Temp: 98.1 F (36.7 C) (10/21 0807) Temp Source: Oral (10/21 0807) BP: 90/60 (10/21 0912) Pulse Rate: 84 (10/21 0912)  Labs: Recent Labs    05/14/23 0303 05/15/23 0246 05/16/23 0254  HGB  --  7.2* 7.1*  HCT  --  23.2* 23.4*  PLT  --  155 153  LABPROT 28.8* 25.3* 26.3*  INR 2.7* 2.3* 2.4*  CREATININE 4.22* 3.73*  --     Estimated Creatinine Clearance: 17.9 mL/min (A) (by C-G formula based on SCr of 3.73 mg/dL (H)).   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   Conductive hearing loss    uses hearing aids   Dysrhythmia    A-fib   History of blood transfusion 03/24/2023   Hypothyroidism    Intraspinal abscess    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   Nonischemic cardiomyopathy (HCC)    Psychosexual dysfunction with inhibited sexual excitement    S/P mitral valve replacement    Syncope and collapse    Unspecified sleep apnea    last sleep study 11/07, uses cpap   Ventricular tachycardia (HCC)    VT (ventricular tachycardia) (HCC)      Assessment: [name] with AFib and mMVR on warfarin PTA admitted with weakness and dyspnea. Pharmacy consulted for warfarin. INR on 10/15 was 4.3, and warfarin/Lovenox was held. Sounds like last dose of warfarin was 10/14. INR on admit 10/16, remained elevated at 4.2. Of note, patient was on Lovenox bridge with warfarin 10/7-14. PTA warfarin dose: 2.5 mg  daily  INR this morning 2.4 (subtherapeutic). Most likely subtherapeutic d/t patients warfarin being held the last few days. Will need time to reach steady state. PO intake has been minimal recently but improved. Will give a small increase today.   Goal of Therapy:  INR goal  3-3.5 Monitor platelets by anticoagulation protocol: Yes   Plan:  Warfarin 3 mg x1 tonight Monitor daily INR, CBC, clinical course, s/sx of bleed, PO intake/diet, Drug-Drug Interactions  Ulyses Southward, PharmD, [place], AAHIVP, CPP Infectious Disease Pharmacist 05/16/2023 9:46 AM

## 2023-05-16 NOTE — Progress Notes (Signed)
Initial Nutrition Assessment  DOCUMENTATION CODES:   Not applicable  INTERVENTION:  Continue regular diet as ordered Ensure Enlive po BID, each supplement provides 350 kcal and 20 grams of protein.  NUTRITION DIAGNOSIS:   Increased nutrient needs related to acute illness as evidenced by estimated needs.  GOAL:   Patient will meet greater than or equal to 90% of their needs  MONITOR:   PO intake, Supplement acceptance, Labs, Weight trends  REASON FOR ASSESSMENT:   Consult Assessment of nutrition requirement/status  ASSESSMENT:   Pt admitted with heart failure decompensation. PMH significant for heart failure, afib, s/p MVR, CKD and hypothyroidism, recent neuro stimulator removal d/t pseudomonas infection 9/17.  Pending d/c to SNF.   Attempted to speak with pt at bedside to obtain nutrition related history, however assessment cut short d/t pt needing to answer a phone call.   Pt mentions that he was eating well at [facility] PTA but has not been eating as well during admission d/t lack of flavor.  Unable to elicit further detailed history. Will attempt to gather more information on follow up.   Meal completions: 10/19: 100% breakfast, 100% lunch 10/20: 30% breakfast, 50% lunch, 100% dinner 10/21: 50% breakfast, 40% lunch  Reviewed weight history on file. Pt's weight noted to have fluctuations between 87-90 kg over the last 4 months which could be in part related to heart failure. Unable to determine any true trends however his weight has been gradually declining over the last year.   Edema: mild pitting BLE  Medications: miralax, torsemide, warfarin  Labs: BUN 50, Cr 3.73, GFR 16   NUTRITION - FOCUSED PHYSICAL EXAM: Deferred to follow up.   Diet Order:   Diet Order             Diet regular Fluid consistency: Thin; Fluid restriction: 1500 mL Fluid  Diet effective now                   EDUCATION NEEDS:   Not appropriate for education at this  time  Skin:  Skin Assessment: Skin Integrity Issues: Skin Integrity Issues:: Other (Comment) Other: L upper chest surgical wound; L shoulder skin tear  Last BM:  10/20  Height:   Ht Readings from Last 1 Encounters:  05/12/23 6\' 1"  (1.854 m)    Weight:   Wt Readings from Last 1 Encounters:  05/16/23 85.3 kg   BMI:  Body mass index is 24.82 kg/m.  Estimated Nutritional Needs:   Kcal:  2000-2200  Protein:  105-120g  Fluid:  >/=2L  Drusilla Kanner, RDN, LDN Clinical Nutrition

## 2023-05-16 NOTE — Progress Notes (Signed)
Progress Note   Patient: Austin [name] OZH:[id number] DOB: 1940-11-18 DOA: 05/11/2023     5 DOS: the patient was seen and examined on 05/16/2023   Brief hospital course: [name] was admitted to the hospital with the working diagnosis of heart failure decompensation.  82 yo male with the past medical history of heart failure, atrial fibrillation, sp mitral valve replacement, CKD and hypothyroidism who presented with dyspnea. Recent neuro stimulator removal due to pseudomonas infection 04/12/23.  Reported 2 days of worsening dyspnea, associated with lower extremity edema and orthopnea. Worsening weakness right lower extremity. On his initial physical examination his blood pressure was 92/73, HR 84, RR 17 and 02 saturation 99%, lungs with no wheezing or rhonchi, heart with S1 and S2 present and regular, abdomen with no distention and positive lower extremity edema.   Na 132, K 6,2 Cl 97, bicarbonate 22, glucose 105 bun 48 cr 4.14 Mg 2.6  AST 99, ALT 62 BNP 1,224  High sensitive troponin 51 and 49  Wbc 9,6 hgb 8,0 plt 178  INR 4,2  TSH 12.9  Urine analysis SG 1,009, negative protein, negative leukocytes and negative hgb.   Head CT negative for hemorrhage, mas effect of midline shift. Chronic small vessel ischemic changes of the white matter and atrophy. 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  Hypodensity at the medial left temporal lobe potentially due to artifact though appearance an axial views makes if difficult to exclude acute to subacute infarct.   Chest radiograph with hypoinflation, cardiomegaly with bilateral hilar vascular congestion, no infiltrates, pacemaker defibrillator in the right hemithorax with one right atrial lead, one right and one left ventricular leads.   EKG 84 bpm, left axis deviation, interventricular conduction delay, qtc 612, atrial and ventricular paced rhythm with no significant ST segment or T wave changes.   10/18 improvement in  volume status, renal function not yet stable. 10/19 renal function stable, pending insurance authorization in order to return to SNF.  10/20 pending transfer to SNF. Will check in am with vascular surgery for drain evaluation. 10/21 hgb down to 7.1., transfusing today one unit PRBC.  Assessment and Plan: * Acute on chronic systolic CHF (congestive heart failure) (HCC) 03/2023 echocardiogram with reduced LV systolic function EF 20%, global hypokinesis, RV systolic function with mild reduction, moderate TR, (limited).   Volume status has improved.  Systolic blood pressure 86 to 90 mmHg.   Limited pharmacologic therapy due to hypotension.  Continue with midodrine for blood pressure support.  Diuretic therapy torsemide 80 mg daily.   Atrial fibrillation, chronic (HCC) Patient with paced rhythm.  Continue amiodarone for rate control. Anticoagulation with warfarin, pharmacy protocol.    Acquired hypothyroidism Elevated TSH, continue with levothyroxine.   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   Continue diuretic therapy to prevent volume overload.  Torsemide 80 mg po daily.   Anemia of chronic renal disease.  Follow up Hgb 7.1 Iron panel with no deficiency.  Considering heart disease and symptoms, will plan to transfer one unit PRBC today.         Subjective: Patient with no chest pain or dyspnea, no dizziness or lightheadedness, continue very weak and deconditioned   Physical Exam: Vitals:   05/16/23 0400 05/16/23 0807 05/16/23 0912 05/16/23 1018  BP: 101/64 (!) 80/54 90/60 (!) 86/64  Pulse: 82 84 84 85  Resp: 18 20  20   Temp: (!) 97.5 F (36.4 C) 98.1 F (36.7  C)  (!) 97.5 F (36.4 C)  TempSrc: Oral Oral  Oral  SpO2: 96% 97% 100% 100%  Weight: 85.3 kg     Height:       Neurology awake and alert ENT with positive pallor with no icterus Cardiovascular with S1 and S2 present and regular with no  gallops, rubs or murmurs No JVD No lower extremity edema Respiratory with no rales or wheezing, no rhonchi Abdomen with no distention  Data Reviewed:    Family Communication: no family at the bedside   Disposition: Status is: Inpatient Remains inpatient appropriate because: PRBC transfusion, pending transfer to SNF.   Planned Discharge Destination: Skilled nursing facility      Author: Coralie Keens, MD 05/16/2023 12:15 PM  For on call review [email or web address].

## 2023-05-17 ENCOUNTER — Inpatient Hospital Stay: Payer: Medicare Other | Admitting: Internal Medicine

## 2023-05-17 ENCOUNTER — Encounter: Payer: Self-pay | Admitting: Nurse Practitioner

## 2023-05-17 ENCOUNTER — Other Ambulatory Visit (HOSPITAL_COMMUNITY): Payer: Self-pay

## 2023-05-17 DIAGNOSIS — I482 Chronic atrial fibrillation, unspecified: Secondary | ICD-10-CM | POA: Diagnosis not present

## 2023-05-17 DIAGNOSIS — E039 Hypothyroidism, unspecified: Secondary | ICD-10-CM | POA: Diagnosis not present

## 2023-05-17 DIAGNOSIS — I5023 Acute on chronic systolic (congestive) heart failure: Secondary | ICD-10-CM | POA: Diagnosis not present

## 2023-05-17 DIAGNOSIS — N179 Acute kidney failure, unspecified: Secondary | ICD-10-CM | POA: Diagnosis not present

## 2023-05-17 LAB — TYPE AND SCREEN
ABO/RH(D): A POS
Antibody Screen: NEGATIVE
Unit division: 0

## 2023-05-17 LAB — BPAM RBC
Blood Product Expiration Date: 202411182359
ISSUE DATE / TIME: 202410211558
Unit Type and Rh: 6200

## 2023-05-17 LAB — CBC
HCT: 27 % — ABNORMAL LOW (ref 39.0–52.0)
Hemoglobin: 8.5 g/dL — ABNORMAL LOW (ref 13.0–17.0)
MCH: 30 pg (ref 26.0–34.0)
MCHC: 31.5 g/dL (ref 30.0–36.0)
MCV: 95.4 fL (ref 80.0–100.0)
Platelets: 156 10*3/uL (ref 150–400)
RBC: 2.83 MIL/uL — ABNORMAL LOW (ref 4.22–5.81)
RDW: 18.1 % — ABNORMAL HIGH (ref 11.5–15.5)
WBC: 7.8 10*3/uL (ref 4.0–10.5)
nRBC: 0.3 % — ABNORMAL HIGH (ref 0.0–0.2)

## 2023-05-17 LAB — PROTIME-INR
INR: 2.1 — ABNORMAL HIGH (ref 0.8–1.2)
Prothrombin Time: 24 s — ABNORMAL HIGH (ref 11.4–15.2)

## 2023-05-17 LAB — BASIC METABOLIC PANEL
Anion gap: 11 (ref 5–15)
BUN: 40 mg/dL — ABNORMAL HIGH (ref 8–23)
CO2: 30 mmol/L (ref 22–32)
Calcium: 8.2 mg/dL — ABNORMAL LOW (ref 8.9–10.3)
Chloride: 99 mmol/L (ref 98–111)
Creatinine, Ser: 3.15 mg/dL — ABNORMAL HIGH (ref 0.61–1.24)
GFR, Estimated: 19 mL/min — ABNORMAL LOW (ref >60)
Glucose, Bld: 85 mg/dL (ref 70–99)
Potassium: 3 mmol/L — ABNORMAL LOW (ref 3.5–5.1)
Sodium: 140 mmol/L (ref 135–145)

## 2023-05-17 LAB — T4, FREE: Free T4: 1.21 ng/dL — ABNORMAL HIGH (ref 0.61–1.12)

## 2023-05-17 MED ORDER — MIDODRINE HCL 5 MG PO TABS
5.0000 mg | ORAL_TABLET | Freq: Three times a day (TID) | ORAL | 0 refills | Status: DC
  Filled 2023-05-17: qty 90, 30d supply, fill #0

## 2023-05-17 MED ORDER — POTASSIUM CHLORIDE CRYS ER 20 MEQ PO TBCR
40.0000 meq | EXTENDED_RELEASE_TABLET | Freq: Once | ORAL | Status: AC
  Administered 2023-05-17: 40 meq via ORAL
  Filled 2023-05-17: qty 2

## 2023-05-17 MED ORDER — TORSEMIDE 40 MG PO TABS: 80.0000 mg | ORAL_TABLET | Freq: Every day | ORAL | Status: DC

## 2023-05-17 MED ORDER — WARFARIN SODIUM 2 MG PO TABS
4.0000 mg | ORAL_TABLET | Freq: Once | ORAL | Status: AC
  Administered 2023-05-17: 4 mg via ORAL
  Filled 2023-05-17: qty 2

## 2023-05-17 NOTE — Progress Notes (Signed)
This encounter was created in error - please disregard.

## 2023-05-17 NOTE — Progress Notes (Deleted)
Electrophysiology Office Note:   Date:  05/17/2023  ID:  Nasif, Len 03-14-1941, MRN [id number]  Primary Cardiologist: None Electrophysiologist: None  {Click to update primary MD,subspecialty MD or APP then REFRESH:1}    History of Present Illness:   Austin [name] is a 82 y.o. male with h/o *** seen today for {VISITTYPE:28148}  Review of systems complete and found to be negative unless listed in HPI.      EP Information / Studies Reviewed:    {EKGtoday:28818}      ICD Interrogation-  reviewed in detail today,  See PACEART report.  Device History: {INDUSTRY:28136} {Blank single:19197::"Dual Chamber","Single Chamber","BiV","S-ICD"} ICD implanted *** for *** History of appropriate therapy: {yes/no:20286} History of AAD therapy: {Blank single:19197::"No","Yes; currently on ***","Yes; previously tolerated ***"}   Risk Assessment/Calculations:   {Does this patient have ATRIAL FIBRILLATION?[phone]}          Physical Exam:   VS:  There were no vitals taken for this visit.   Wt Readings from Last 3 Encounters:  05/17/23 186 lb 15.2 oz (84.8 kg)  05/10/23 193 lb 11.2 oz (87.9 kg)  05/09/23 169 lb 9.6 oz (76.9 kg)     GEN: Well nourished, well developed in no acute distress NECK: No JVD; No carotid bruits CARDIAC: {EPRHYTHM:28826}, no murmurs, rubs, gallops RESPIRATORY:  Clear to auscultation without rales, wheezing or rhonchi  ABDOMEN: Soft, non-tender, non-distended EXTREMITIES:  No edema; No deformity   ASSESSMENT AND PLAN:    Chronic systolic dysfunction s/p {INDUSTRY:28136} {Blank single:19197::"***","single chamber ICD","dual chamber ICD","CRT-D","S-ICD"}  euvolemic today Stable on an appropriate medical regimen Normal ICD function See Pace Art report No changes today  Disposition:   Follow up with {EPPROVIDERS:28135} {EPFOLLOW WG:95621}   Signed, Canary Brim, NP

## 2023-05-17 NOTE — Progress Notes (Signed)
Pharmacist Heart Failure Core Measure Documentation  Assessment: Austin [name] has an EF documented as 20% on 91/12/24 by Echo.  Rationale: Heart failure patients with left ventricular systolic dysfunction (LVSD) and an EF < 40% should be prescribed an angiotensin converting enzyme inhibitor (ACEI) or angiotensin receptor blocker (ARB) at discharge unless a contraindication is documented in the medical record.  This patient is not currently on an ACEI or ARB for HF.  This note is being placed in the record in order to provide documentation that a contraindication to the use of these agents is present for this encounter.  ACE Inhibitor or Angiotensin Receptor Blocker is contraindicated (specify all that apply)  []   ACEI allergy AND ARB allergy []   Angioedema []   Moderate or severe aortic stenosis []   Hyperkalemia [x]   Hypotension []   Renal artery stenosis [x]   Worsening renal function, preexisting renal disease or dysfunction

## 2023-05-17 NOTE — TOC Progression Note (Signed)
Transition of Care [facility]) - Progression Note    Patient Details  Name: Austin [name] MRN: [id number] Date of Birth: 13-Nov-1940  [facility] CM/SW Contact  Dellie Burns [place], [place] Phone Number: 05/17/2023, 3:56 PM  Clinical Narrative: updated therapy notes sent to [facility] at their request. Auth remains pending for [facility] at this time. Pt updated re barrier to dc.   Dellie Burns, MSW, LCSW [phone] (coverage)        Expected Discharge Plan: Skilled Nursing Facility Barriers to Discharge: Continued Medical Work up  Expected Discharge Plan and Services In-house Referral: Clinical Social Work     Living arrangements for the past 2 months:  (from [facility] short term)                                       Social Determinants of Health (SDOH) Interventions SDOH Screenings   Food Insecurity: No Food Insecurity (05/12/2023)  Housing: Low Risk  (05/12/2023)  Transportation Needs: No Transportation Needs (05/12/2023)  Utilities: Not At Risk (05/12/2023)  Alcohol Screen: Low Risk  (03/08/2023)  Depression (PHQ2-9): Low Risk  (03/08/2023)  Financial Resource Strain: Low Risk  (03/08/2023)  Physical Activity: Insufficiently Active (03/08/2023)  Social Connections: Moderately Integrated (03/08/2023)  Stress: No Stress Concern Present (03/08/2023)  Tobacco Use: Low Risk  (05/17/2023)  Health Literacy: Adequate Health Literacy (03/08/2023)    Readmission Risk Interventions     No data to display

## 2023-05-17 NOTE — Discharge Summary (Addendum)
Physician Discharge Summary   Patient: Austin [name] MRN: [id number] DOB: June 10, 1941  Admit date:     05/11/2023  Discharge date: 05/17/23  Discharge Physician: York Ram Erryn Dickison   PCP: Laurey Morale, MD   Recommendations at discharge:    Patient will continue diuresis with torsemide 80 mg po daily, increase to bid in case of volume overload, weight increase 2 to 3 lbs in 24 hrs or 5 lbs in 7 days.  Added midodrine for blood pressure support.  Follow up renal function and electrolytes in 7 days as outpatient.  Patient with advanced heart failure, biventricular failure, with limited pharmacologic options due to hypotension and low GFR.  Follow up with Vascular surgery as outpatient for chest wall drain.  Follow up with Heart Failure Clinic as scheduled.   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 [facility] Course: [name] was admitted to the hospital with the working diagnosis of heart failure decompensation.  82 yo male with the past medical history of heart failure, atrial fibrillation, sp mitral valve replacement, CKD and hypothyroidism who presented with dyspnea. Recent Baro-stimulator removal due to pseudomonas infection and chest wall hematoma, 04/12/23.  Reported 2 days of worsening dyspnea, associated with lower extremity edema and orthopnea. Worsening weakness right lower extremity. On his initial physical examination his blood pressure was 92/73, HR 84, RR 17 and 02 saturation 99%, lungs with no wheezing or rhonchi, heart with S1 and S2 present and regular, abdomen with no distention and positive lower extremity edema.   Na 132, K 6,2 Cl 97, bicarbonate 22, glucose 105 bun 48 cr  4.14 Mg 2.6  AST 99, ALT 62 BNP 1,224  High sensitive troponin 51 and 49  Wbc 9,6 hgb 8,0 plt 178  INR 4,2  TSH 12.9  Urine analysis SG 1,009, negative protein, negative leukocytes and negative hgb.   Head CT negative for hemorrhage, mas effect of midline shift. Chronic small vessel ischemic changes of the white matter and atrophy. 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  Hypodensity at the medial left temporal lobe potentially due to artifact though appearance an axial views makes if difficult to exclude acute to subacute infarct.   Chest radiograph with hypoinflation, cardiomegaly with bilateral hilar vascular congestion, no infiltrates, pacemaker defibrillator in the right hemithorax with one right atrial lead, one right and one left ventricular leads.   EKG 84 bpm, left axis deviation, interventricular conduction delay, qtc 612, atrial and ventricular paced rhythm with no significant ST segment or T wave changes.   Patient was placed on IV furosemide.   10/18 improvement in volume status, renal function not yet stable. 10/19 renal function stable, pending insurance authorization in order to return to SNF.  10/20 pending transfer to SNF. Will check in am with vascular surgery for drain evaluation. 10/21 hgb down to 7.1., transfusing today one unit PRBC. 10/22 patient medically stable for transfer to SNF, plan to follow up with vascular surgery and cardiology as outpatient.   Assessment and Plan: * Acute on chronic systolic CHF (congestive heart failure) (HCC) 03/2023 echocardiogram with reduced LV systolic function EF 20%, global hypokinesis, RV systolic function with mild reduction, moderate TR, (limited).   History of VT and VT ablation, sp  ICD.  Advanced biventricular heart failure, close to end stage. Unable to tolerate GDMT due to hypotension and decreased GFR.   Patient was placed on furosemide IV for diuresis, negative fluid balance was achieved with  improvement in his symptoms.   Patient has been placed on midodrine for blood pressure support.  Plan to continue diuretic therapy torsemide 80 mg daily.  Follow up with heart failure clinic.  Chest wall drain will continue in place, and he will follow up with vascular surgery as outpatient.   Atrial fibrillation, chronic (HCC) Patient with paced rhythm.  Continue amiodarone for rate control. Anticoagulation with warfarin, pharmacy protocol.    Acquired hypothyroidism Elevated TSH with mild elevation in free T4.  Plan to continue with levothyroxine.   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   Renal function with serum cr at 3,15 with K at 3,0 and serum bicarbonate at 30. Na 140.  Plan to continue de-congestion with torsemide.   Anemia of chronic renal disease.  SP 1 unit PRBC transfusion with follow up Hgb 8.5  Iron panel with no deficiency.           Consultants: none  Procedures performed: none   Disposition: Home Diet recommendation:  Cardiac diet DISCHARGE MEDICATION: Allergies as of 05/17/2023   No Known Allergies      Medication List     TAKE these medications    acetaminophen 500 MG tablet Commonly known as: TYLENOL Take 1,000 mg by mouth every 8 (eight) hours as needed for headache, fever or mild pain.   amiodarone 400 MG tablet Commonly known as: PACERONE Take 400 mg by mouth daily.   levothyroxine 88 MCG tablet Commonly known as: SYNTHROID TAKE 1 TABLET BY MOUTH EVERY DAY   melatonin 5 MG Tabs Take 5 mg by mouth at bedtime.   midodrine 5 MG tablet Commonly known as: PROAMATINE Take 1 tablet (5 mg total) by mouth 3 (three) times daily with meals.   mirtazapine 7.5 MG tablet Commonly known as: REMERON Take 1 tablet (7.5 mg total) by mouth at bedtime.   multivitamin with minerals Tabs tablet Take 1 tablet by mouth in the morning.   mupirocin ointment 2 % Commonly  known as: BACTROBAN Apply 1 Application topically daily. What changed:  when to take this additional instructions   nitroGLYCERIN 0.4 MG SL tablet Commonly known as: NITROSTAT Place 1 tablet (0.4 mg total) under the tongue every 5 (five) minutes x 3 doses as needed for chest pain.   potassium chloride SA 20 MEQ tablet Commonly known as: KLOR-CON M Take 1 tablet (20 mEq total) by mouth 2 (two) times daily.   quiniDINE gluconate 324 MG CR tablet TAKE 1 TABLET BY MOUTH 2 TIMES DAILY.   Torsemide 40 MG Tabs Take 80 mg by mouth daily. Start taking on: May 18, 2023 What changed: when to take this   warfarin 2.5 MG tablet Commonly known as: COUMADIN Take as directed. If you are unsure how to take this medication, talk to your nurse or doctor. Original instructions: Take 1-2 tablets Daily or as prescribed by Coumadin Clinic. What changed:  how much to take how to take this when to take this additional instructions        Follow-up Information     [facility] Follow up in 2 week(s).   Specialty: Vascular Surgery Why: Office will call to arrange your appt(s) (sent) Contact information:  [address] [place] [address] [phone]               Discharge Exam: Ceasar Mons Weights   05/15/23 0440 05/16/23 0400 05/17/23 0424  Weight: 86.8 kg 85.3 kg 84.8 kg   BP 93/61   Pulse 83   Temp (!) 97.4 F (36.3 C) (Oral)   Resp 18   Ht 6\' 1"  (1.854 m)   Wt 84.8 kg   SpO2 100%   BMI 24.67 kg/m   Patient feeling well, no chest pain, no dyspnea, no PND or orthopnea  Neurology awake and alert ENT With mild pallor Cardiovascular with S1 and S2 present regular with no gallops, rubs, positive systolic murmur at the right lower sternal border. No JVD Respiratory with no rales or wheezing, no rhonchi. Abdomen with no distention  No lower extremity edema  Condition at discharge: stable  The results of  significant diagnostics from this hospitalization (including imaging, microbiology, ancillary and laboratory) are listed below for reference.   Imaging Studies: CT Head Wo Contrast  Result Date: 05/11/2023 CLINICAL DATA:  Mental status change EXAM: CT HEAD WITHOUT CONTRAST TECHNIQUE: Contiguous axial images were obtained from the base of the skull through the vertex without intravenous contrast. RADIATION DOSE REDUCTION: This exam was performed according to the departmental dose-optimization program which includes automated exposure control, adjustment of the mA and/or kV according to patient size and/or use of iterative reconstruction technique. COMPARISON:  CT brain 05/04/2023 FINDINGS: Brain: No hemorrhage or intracranial mass. 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 Chronic small vessel ischemic changes of the white matter and atrophy. Question hypodensity within the medial left temporal lobe, series 2 image 9 and 10, but appearance on sagittal views suggest potentially related to artifact. Vascular: No hyperdense vessels. Vertebral and carotid vascular calcification Skull: Normal. Negative for fracture or focal lesion. Sinuses/Orbits: No acute finding. Other: None IMPRESSION: 1. Negative for hemorrhage, mass, mass effect or midline shift 2. Chronic small vessel ischemic changes of the white matter and atrophy. 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 3. Hypodensity at the medial left temporal lobe potentially due to artifact though appearance on axial views makes it difficult to exclude acute to subacute infarct. Electronically Signed   By: Jasmine Pang M.D.   On: 05/11/2023 21:55   DG Chest Port 1 View  Result Date: 05/11/2023 CLINICAL DATA:  Weakness EXAM: PORTABLE CHEST 1 VIEW COMPARISON:  05/04/2023, 04/09/2023 FINDINGS: Post sternotomy changes and valve prosthesis. Right-sided multi lead pacing device as before. Low lung volumes.  Cardiomegaly. Chronic elevation right diaphragm. Mild central congestion. No consolidation. Possible small left effusion. Removal of previously noted left upper chest drainage catheter IMPRESSION: Cardiomegaly with mild central congestion. Possible small left effusion. Electronically Signed   By: Jasmine Pang M.D.   On: 05/11/2023 21:50   CT Chest Wo Contrast  Result Date: 05/08/2023 CLINICAL DATA:  Chest trauma EXAM: CT CHEST WITHOUT CONTRAST TECHNIQUE: Multidetector CT imaging of the chest was performed following the standard protocol without IV contrast. RADIATION DOSE REDUCTION: This exam was performed according to the departmental dose-optimization program which includes automated exposure control, adjustment of the mA and/or kV according to patient size and/or use of iterative reconstruction technique. COMPARISON:  Chest CT 04/29/2023 FINDINGS: Cardiovascular: The heart is moderately enlarged. The aorta is normal in size. There is no pericardial effusion. Right-sided ICD is present. 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 Right thyroid is within limits. No enlarged  lymph nodes are seen. Esophagus is within normal limits. Lungs/Pleura: Small amount of airspace disease in the anterior left upper lobe appears similar to the prior study. There some ground-glass and interstitial opacities in both lung bases and atelectasis in the right lower lobe, similar to prior. There is no pneumothorax. Upper Abdomen: There is a suggestion of nodular liver contour. This is unchanged. Musculoskeletal: There is bilateral gynecomastia. Percutaneous drainage catheter is seen in the anterior upper left chest wall. Previously identified fluid collection is no longer seen. There is soft tissue swelling and edema in this region in the subcutaneous tissues and within the muscular compartments. There some new small lymph nodes in this region as well. There is no evidence for soft tissue gas. Bilateral  gynecomastia is again noted. No acute fractures are seen. There are degenerative changes of the shoulders. Sternotomy wires are present. IMPRESSION: 1. Percutaneous drainage catheter in the anterior upper left chest wall. Previously identified fluid collection is no longer seen. There is soft tissue swelling and edema in this region with new small lymph nodes. No soft tissue gas. Findings may be related to cellulitis. 2. Stable moderate cardiomegaly. 3. Stable bilateral lower lobe ground-glass and interstitial opacities, likely infectious/inflammatory. 4. Stable focal airspace disease in the left upper lobe, indeterminate for atelectasis or infection. Follow-up imaging recommended to confirm complete resolution. 5. Nodular liver contour may represent cirrhosis. Electronically Signed   By: Darliss Cheney M.D.   On: 05/08/2023 22:57   CT HEAD WO CONTRAST ( )  Result Date: 05/04/2023 CLINICAL DATA:  Polytrauma, blunt. Fall to the ground in the setting of near syncope and lightheadedness, on Coumadin for mechanical valve replacement. Recent thoracic chest procedure. EXAM: CT HEAD WITHOUT CONTRAST TECHNIQUE: Contiguous axial images were obtained from the base of the skull through the vertex without intravenous contrast. RADIATION DOSE REDUCTION: This exam was performed according to the departmental dose-optimization program which includes automated exposure control, adjustment of the mA and/or kV according to patient size and/or use of iterative reconstruction technique. COMPARISON:  Head CT and MRI 05/23/2005 FINDINGS: Brain: There is no evidence of an acute infarct, intracranial hemorrhage, mass, midline shift, or extra-axial fluid collection. A small chronic right frontal cortical infarct appears larger than on the prior studies. There are new small infarcts in the left frontal lobe and right cerebellar hemisphere which are chronic in appearance. Hypodensities elsewhere in the cerebral white matter bilaterally  have progressed and are nonspecific but compatible with mild chronic small vessel ischemic disease. There is mild cerebral atrophy. A cavum septum pellucidum et vergae is noted, a normal variant. Vascular: Calcified atherosclerosis at the skull base. No hyperdense vessel. Skull: No acute fracture or suspicious osseous lesion. Sinuses/Orbits: Visualized paranasal sinuses and mastoid air cells are clear. Unremarkable orbits. Other: None. IMPRESSION: 1. No evidence of acute intracranial abnormality. 2. 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 Electronically Signed   By: Sebastian Ache M.D.   On: 05/04/2023 18:10   DG Chest 2 View  Result Date: 05/04/2023 CLINICAL DATA:  Near-syncope. Recent thoracic procedure. On Coumadin EXAM: CHEST - 2 VIEW COMPARISON:  X-ray 04/09/2023.  CT 04/29/2023 FINDINGS: Underinflation. Enlarged cardiopericardial silhouette with some vascular congestion. Question tiny effusions. There is some basilar atelectasis. No pneumothorax. Prosthetic cardiac valve. Right upper chest defibrillator with leads along the right side of the heart including the coronary sinus. Left upper chest wall pigtail catheter again seen with skin staples. IMPRESSION: Postop chest with enlarged heart, vascular congestion, small effusions and  defibrillator. No pneumothorax. Pigtail catheter overlying the left upper chest with skin staples. Underinflation. Electronically Signed   By: Karen Kays M.D.   On: 05/04/2023 17:26   IR US Guide Bx Asp/Drain  Result Date: 04/30/2023 INDICATION: 82 year old with history of a Barostim device removed from the left chest. Patient had a JP drain placed in surgery for a seroma. The drain has been dislodged. Patient has a recurrent superficial fluid collection in the left anterior chest. EXAM: ULTRASOUND-GUIDED PLACEMENT OF DRAINAGE CATHETER IN LEFT ANTERIOR CHEST WALL FLUID COLLECTION MEDICATIONS: 1% lidocaine for local anesthetic ANESTHESIA/SEDATION: None  COMPLICATIONS: None immediate. PROCEDURE: Informed written consent was obtained from the patient after a thorough discussion of the procedural risks, benefits and alternatives. All questions were addressed. Maximal Sterile Barrier Technique was utilized including caps, mask, sterile gowns, sterile gloves, sterile drape, hand hygiene and skin antiseptic. A timeout was performed prior to the initiation of the procedure. Ultrasound demonstrated a large fluid collection in the left anterior chest. Left chest was prepped and draped in sterile fashion. Skin was anesthetized with 1% lidocaine along the lateral aspect of the chest. Small incision was made. Using ultrasound guidance, an 18 gauge trocar needle was directed into the fluid collection and brown fluid was aspirated. Superstiff Amplatz wire was placed. Tract was dilated to accommodate a 12 [place] multipurpose drain. Greater than 150 mL of fluid was initially removed. Drain was sutured to the skin and attached to a suction bulb. Dressing was placed. Ultrasound images were taken and saved for this procedure. FINDINGS: Large relatively simple fluid collection in the left anterior chest wall. Drain was confirmed within the fluid collection using ultrasound guidance. Greater than 150 mL of brown fluid was removed. IMPRESSION: Ultrasound-guided placement of drainage catheter in the left anterior chest wall fluid collection. Electronically Signed   By: Richarda Overlie M.D.   On: 04/30/2023 19:18   CT Chest Wo Contrast  Result Date: 04/29/2023 CLINICAL DATA:  Postoperative swelling. Recent removal of marrow stem device. JP drain removed 4 days ago. EXAM: CT CHEST WITHOUT CONTRAST TECHNIQUE: Multidetector CT imaging of the chest was performed following the standard protocol without IV contrast. RADIATION DOSE REDUCTION: This exam was performed according to the departmental dose-optimization program which includes automated exposure control, adjustment of the mA and/or kV  according to patient size and/or use of iterative reconstruction technique. COMPARISON:  Radiograph 04/09/2023. FINDINGS: Cardiovascular: The heart is enlarged. Prosthetic mitral valve. Pacemaker in place. No pericardial effusion. Aortic atherosclerosis without aneurysm. No pericardial effusion. Mediastinum/Nodes: No mediastinal adenopathy. Assessment for hilar adenopathy is limited in the absence of IV contrast. No definite enlarged axillary nodes. Left lobe of the thyroid gland is diminutive or absent. Decompressed esophagus. Lungs/Pleura: Anterior left upper lobe opacity with air bronchogram is indeterminate for atelectasis or infection. Compressive atelectasis in the right lower lobe related to elevated hemidiaphragm. No pleural effusion or features of pulmonary edema. Upper Abdomen: Fatty atrophy of the pancreas. No acute upper abdominal findings. Musculoskeletal: Ill-defined fluid collection in the left anterior chest wall. Insert measurements are difficult due to irregular shape, however this measures approximately 13.8 x 4.6 x 10.4 cm, series 3, image 62 and series 5, image 101. This contains an air-fluid level medially. Adjacent strandy density and ill-defined fluid anteriorly and tracking inferiorly. Mild bilateral gynecomastia. Skin staples in the anterior left upper chest wall at the superior aspect of fluid collection. There are no acute or suspicious osseous abnormalities. Bilateral gynecomastia. Chronic bilateral shoulder arthropathy. Prior median sternotomy.  IMPRESSION: 1. Ill-defined fluid collection in the left anterior chest wall measuring approximately 13.8 x 4.6 x 10.4 cm. This contains an air-fluid level medially. This may represent a postoperative seroma, hematoma, or abscess. Sterility is indeterminate by imaging. 2. Anterior left upper lobe opacity with air bronchogram is indeterminate for atelectasis or infection. 3. Cardiomegaly. Aortic Atherosclerosis (ICD10-I70.0). Electronically Signed    By: Narda Rutherford M.D.   On: 04/29/2023 18:35    Microbiology: Results for orders placed or performed during the hospital encounter of 05/04/23  Blood culture (routine x 2)     Status: None   Collection Time: 05/04/23  4:45 PM   Specimen: BLOOD  Result Value Ref Range Status   Specimen Description BLOOD LEFT ANTECUBITAL  Final   Special Requests   Final    BOTTLES DRAWN AEROBIC ONLY Blood Culture adequate volume   Culture   Final    NO GROWTH 5 DAYS Performed at [facility], [address], [place], [place] [address]    Report Status 05/09/2023 FINAL  Final  Blood culture (routine x 2)     Status: None   Collection Time: 05/04/23  5:41 PM   Specimen: BLOOD  Result Value Ref Range Status   Specimen Description BLOOD SITE NOT SPECIFIED  Final   Special Requests   Final    BOTTLES DRAWN AEROBIC ONLY Blood Culture results may not be optimal due to an inadequate volume of blood received in culture bottles   Culture   Final    NO GROWTH 5 DAYS Performed at [facility], [address] [place], [place] [address]    Report Status 05/09/2023 FINAL  Final   *Note: Due to a large number of results and/or encounters for the requested time period, some results have not been displayed. A complete set of results can be found in Results Review.    Labs: CBC: Recent Labs  Lab 05/11/23 1832 05/12/23 0426 05/15/23 0246 05/16/23 0254 05/17/23 0402  WBC 9.6 11.4* 8.7 7.4 7.8  NEUTROABS 7.3 9.1*  --   --   --   HGB 8.0* 7.4* 7.2* 7.1* 8.5*  HCT 25.9* 23.9* 23.2* 23.4* 27.0*  MCV 100.0 99.2 94.3 95.9 95.4  PLT 178 159 155 153 156   Basic Metabolic Panel: Recent Labs  Lab 05/11/23 1832 05/11/23 2220 05/12/23 1144 05/13/23 0438 05/14/23 0303 05/15/23 0246 05/17/23 0402  NA 132*  --  133* 135 135 136 140  K 6.2*   < > 5.2* 4.8 4.6 3.5 3.0*  CL 97*  --  100 95* 94* 99 99  CO2 22  --  23 26 21* 26 30  GLUCOSE 105*  --  131* 102* 86 91 85  BUN 48*  --  49* 53* 55*  50* 40*  CREATININE 4.14*  --  4.30* 4.40* 4.22* 3.73* 3.15*  CALCIUM 8.3*  --  7.9* 8.3* 9.0 8.1* 8.2*  MG 2.6*  --   --   --   --   --   --    < > = values in this interval not displayed.   Liver Function Tests: Recent Labs  Lab 05/11/23 1832  AST 99*  ALT 62*  ALKPHOS 121  BILITOT 1.1  PROT 5.3*  ALBUMIN 2.9*   CBG: No results for input(s): "GLUCAP" in the last 168 hours.  Discharge time spent: greater than 30 minutes.  Signed: Coralie Keens, MD [facility] 05/17/2023

## 2023-05-17 NOTE — Progress Notes (Signed)
Mobility Specialist Progress Note:    05/17/23 0916  Mobility  Activity Ambulated with assistance in hallway  Level of Assistance Contact guard assist, steadying assist  Assistive Device Front wheel walker  Distance Ambulated (ft) 175 ft  Activity Response Tolerated well  Mobility Referral Yes  $Mobility charge 1 Mobility  Mobility Specialist Start Time (ACUTE ONLY) [id number]  Mobility Specialist Stop Time (ACUTE ONLY) [id number]  Mobility Specialist Time Calculation (min) (ACUTE ONLY) 8 min   Pt received in bed, agreeable to mobility session. Required CGA for safety in hallway with RW. Tolerated well, took several standing rest breaks d/t fatigue and back pain. Returned pt to room, all needs met, sitting up in chair, call bell in reach.   Feliciana Rossetti Mobility Specialist Please contact via Special educational needs teacher or  Rehab office at [phone]

## 2023-05-17 NOTE — Plan of Care (Signed)

## 2023-05-17 NOTE — Progress Notes (Signed)
PHARMACY - ANTICOAGULATION CONSULT NOTE  Pharmacy Consult for warfarin Indication:  mMVR + AFib  No Known Allergies  Patient Measurements: Height: 6\' 1"  (185.4 cm) Weight: 84.8 kg (186 lb 15.2 oz) IBW/kg (Calculated) : 79.9  Vital Signs: Temp: 97.4 F (36.3 C) (10/22 0330) Temp Source: Oral (10/22 0330) BP: 97/63 (10/22 0330) Pulse Rate: 84 (10/22 0330)  Labs: Recent Labs    05/15/23 0246 05/16/23 0254 05/17/23 0402  HGB 7.2* 7.1* 8.5*  HCT 23.2* 23.4* 27.0*  PLT 155 153 156  LABPROT 25.3* 26.3* 24.0*  INR 2.3* 2.4* 2.1*  CREATININE 3.73*  --  3.15*    Estimated Creatinine Clearance: 21.1 mL/min (A) (by C-G formula based on SCr of 3.15 mg/dL (H)).   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   Conductive hearing loss    uses hearing aids   Dysrhythmia    A-fib   History of blood transfusion 03/24/2023   Hypothyroidism    Intraspinal abscess    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   Nonischemic cardiomyopathy (HCC)    Psychosexual dysfunction with inhibited sexual excitement    S/P mitral valve replacement    Syncope and collapse    Unspecified sleep apnea    last sleep study 11/07, uses cpap   Ventricular tachycardia (HCC)    VT (ventricular tachycardia) (HCC)      Assessment: [name] with AFib and mMVR on warfarin PTA admitted with weakness and dyspnea. Pharmacy consulted for warfarin. INR on 10/15 was 4.3, and warfarin/Lovenox was held. Sounds like last dose of warfarin was 10/14. INR on admit 10/16, remained elevated at 4.2. Of note, patient was on Lovenox bridge with warfarin 10/7-14. PTA warfarin dose: 2.5 mg  daily  INR this morning 2.1 (subtherapeutic). Most likely subtherapeutic d/t patients warfarin being held the last few days. PO intake has been minimal recently but improved. Will give a higher dose today.   Ok to replace k 3 with KCL x1 per Dr Ella Jubilee.  Goal of Therapy:  INR goal  3-3.5 Monitor platelets by anticoagulation protocol: Yes   Plan:  Warfarin 4 mg x1 tonight Monitor daily INR, CBC, clinical course, s/sx of bleed, PO intake/diet, Drug-Drug Interactions  Ulyses Southward, PharmD, [place], AAHIVP, CPP Infectious Disease Pharmacist 05/17/2023 7:40 AM

## 2023-05-17 NOTE — Progress Notes (Signed)
Occupational Therapy Treatment Patient Details Name: Austin [name] MRN: [id number] DOB: [birth year] Today's Date: 05/17/2023   History of present illness Pt is 82 yo presenting to [facility] ED from [facility] due to shortness of breath and found to have acute on chronic biventricular systolic heart failure. Pt has multiple recent hospitalizations. PMH: Mechanical MV s/p MV endocarditis, VT, NICM, CHB and PAF/AFL, CKD, CHF, syncope, sleep apnea   OT comments  Pt making excellent progress towards OT goals with gradually improving strength and balance. Pt wanted to attempt mobility without AD though Min A required and noted attempts to furniture walk. Mobility improved with use of RW at [facility]. However, static standing balance improving to allow pt to complete ADLs standing at sink without physical assistance. Hoped to address HEP provided w/ last OT session but unable to locate in pt room today. Continue to recommend continued inpatient follow up therapy, <3 hours/day at DC.      If plan is discharge home, recommend the following:  A little help with walking and/or transfers;A lot of help with bathing/dressing/bathroom   Equipment Recommendations  None recommended by OT    Recommendations for Other Services      Precautions / Restrictions Precautions Precautions: Fall Precaution Comments: JP drain L chest Restrictions Weight Bearing Restrictions: No       Mobility Bed Mobility Overal bed mobility: Needs Assistance Bed Mobility: Supine to Sit     Supine to sit: HOB elevated, Used rails, Modified independent (Device/Increase time)          Transfers Overall transfer level: Needs assistance Equipment used: Rolling walker (2 wheels), None Transfers: Sit to/from Stand, Bed to chair/wheelchair/BSC Sit to Stand: Contact guard assist     Step pivot transfers: Contact guard assist     General transfer comment: trial without RW to stand/mobilize with CGA and handheld assist  provied + pt reaching out to furniture     Balance Overall balance assessment: Needs assistance Sitting-balance support: Feet supported Sitting balance-Leahy Scale: Good     Standing balance support: No upper extremity supported, Bilateral upper extremity supported, During functional activity Standing balance-Leahy Scale: Fair Standing balance comment: able to stand statically without issues, reaching out to furniture during dynamic tasks without AD                           ADL either performed or assessed with clinical judgement   ADL Overall ADL's : Needs assistance/impaired     Grooming: Supervision/safety;Standing;Oral care Grooming Details (indicate cue type and reason): standing at sink with distance supervision. good balance unsupported statically         Upper Body Dressing : Set up;Sitting                   Functional mobility during ADLs: Minimal assistance;Contact guard assist;Rolling walker (2 wheels) General ADL Comments: Pt interested in trying mobility without walker though reaching out for support noted w/ pt showing insight "My legs just wont support me" fully. improved with RW use    Extremity/Trunk Assessment Upper Extremity Assessment Upper Extremity Assessment: Generalized weakness;Right hand dominant   Lower Extremity Assessment Lower Extremity Assessment: Defer to PT evaluation        Vision   Vision Assessment?: No apparent visual deficits   Perception     Praxis      Cognition Arousal: Alert Behavior During Therapy: WFL for tasks assessed/performed Overall Cognitive Status: Within Functional Limits for tasks  assessed                                 General Comments: showing insight into deficits but cues needed to be aware of (balance) limitations        Exercises      Shoulder Instructions       General Comments      Pertinent Vitals/ Pain       Pain Assessment Pain Assessment: No/denies  pain  Home Living                                          Prior Functioning/Environment              Frequency  Min 1X/week        Progress Toward Goals  OT Goals(current goals can now be found in the care plan section)  Progress towards OT goals: Progressing toward goals  Acute Rehab OT Goals Patient Stated Goal: get insurance authorization for rehab today OT Goal Formulation: With patient Time For Goal Achievement: 05/27/23 Potential to Achieve Goals: Good ADL Goals Pt Will Perform Grooming: with set-up;standing Pt Will Perform Upper Body Bathing: with modified independence;sitting Pt Will Perform Lower Body Bathing: with modified independence;sit to/from stand Pt Will Perform Lower Body Dressing: with contact guard assist;sit to/from stand Pt Will Transfer to Toilet: with min assist;ambulating Pt/caregiver will Perform Home Exercise Program: Increased strength;With theraband;Independently;With written HEP provided;Both right and left upper extremity Additional ADL Goal #1: Pt to stand > 5 min during ADLs/mobility without rest break  Plan      Co-evaluation                 AM-PAC OT "6 Clicks" Daily Activity     Outcome Measure   Help from another person eating meals?: None Help from another person taking care of personal grooming?: A Little Help from another person toileting, which includes using toliet, bedpan, or urinal?: A Little Help from another person bathing (including washing, rinsing, drying)?: A Lot Help from another person to put on and taking off regular upper body clothing?: A Little Help from another person to put on and taking off regular lower body clothing?: A Lot 6 Click Score: 17    End of Session Equipment Utilized During Treatment: Rolling walker (2 wheels);Gait belt  OT Visit Diagnosis: Unsteadiness on feet (R26.81);Muscle weakness (generalized) (M62.81)   Activity Tolerance Patient tolerated treatment well    Patient Left with call bell/phone within reach;Other (comment) (on [facility], RN/NT aware)   Nurse Communication Mobility status        Time: 0800-0820 OT Time Calculation (min): 20 min  Charges: OT General Charges $OT Visit: 1 Visit OT Treatments $Self Care/Home Management : 8-22 mins  Bradd Canary, OTR/L Acute Rehab Services Office: [phone]   Lorre Munroe 05/17/2023, 8:29 AM

## 2023-05-18 ENCOUNTER — Ambulatory Visit: Payer: Medicare Other | Admitting: Physician Assistant

## 2023-05-18 ENCOUNTER — Other Ambulatory Visit (HOSPITAL_COMMUNITY): Payer: Self-pay

## 2023-05-18 LAB — BASIC METABOLIC PANEL
Anion gap: 12 (ref 5–15)
BUN: 43 mg/dL — ABNORMAL HIGH (ref 8–23)
CO2: 29 mmol/L (ref 22–32)
Calcium: 8 mg/dL — ABNORMAL LOW (ref 8.9–10.3)
Chloride: 98 mmol/L (ref 98–111)
Creatinine, Ser: 3.43 mg/dL — ABNORMAL HIGH (ref 0.61–1.24)
GFR, Estimated: 17 mL/min — ABNORMAL LOW (ref >60)
Glucose, Bld: 103 mg/dL — ABNORMAL HIGH (ref 70–99)
Potassium: 3.6 mmol/L (ref 3.5–5.1)
Sodium: 139 mmol/L (ref 135–145)

## 2023-05-18 LAB — CBC
HCT: 26 % — ABNORMAL LOW (ref 39.0–52.0)
Hemoglobin: 8.4 g/dL — ABNORMAL LOW (ref 13.0–17.0)
MCH: 30.9 pg (ref 26.0–34.0)
MCHC: 32.3 g/dL (ref 30.0–36.0)
MCV: 95.6 fL (ref 80.0–100.0)
Platelets: 149 10*3/uL — ABNORMAL LOW (ref 150–400)
RBC: 2.72 MIL/uL — ABNORMAL LOW (ref 4.22–5.81)
RDW: 18 % — ABNORMAL HIGH (ref 11.5–15.5)
WBC: 7.4 10*3/uL (ref 4.0–10.5)
nRBC: 0 % (ref 0.0–0.2)

## 2023-05-18 LAB — PROTIME-INR
INR: 2.2 — ABNORMAL HIGH (ref 0.8–1.2)
Prothrombin Time: 25 s — ABNORMAL HIGH (ref 11.4–15.2)

## 2023-05-18 LAB — T3: T3, Total: 51 ng/dL — ABNORMAL LOW (ref 71–180)

## 2023-05-18 LAB — T3, FREE: T3, Free: 1.3 pg/mL — ABNORMAL LOW (ref 2.0–4.4)

## 2023-05-18 MED ORDER — WARFARIN SODIUM 2 MG PO TABS
4.0000 mg | ORAL_TABLET | Freq: Once | ORAL | Status: AC
  Administered 2023-05-18: 4 mg via ORAL
  Filled 2023-05-18: qty 2

## 2023-05-18 NOTE — Progress Notes (Signed)
Verbal report called to [facility] with [facility]. AVS printed and sent with patient. Patient discharged to home with all belongings via wheelchair by [facility] staff member at 1455 on 05/18/23.

## 2023-05-18 NOTE — Progress Notes (Signed)
PHARMACY - ANTICOAGULATION CONSULT NOTE  Pharmacy Consult for warfarin Indication:  mMVR + AFib  No Known Allergies  Patient Measurements: Height: 6\' 1"  (185.4 cm) Weight: 84.3 kg (185 lb 12.8 oz) IBW/kg (Calculated) : 79.9  Vital Signs: Temp: 97.5 F (36.4 C) (10/23 0524) Temp Source: Oral (10/23 0524) BP: 103/70 (10/23 0524) Pulse Rate: 85 (10/23 0524)  Labs: Recent Labs    05/16/23 0254 05/17/23 0402 05/18/23 0305  HGB 7.1* 8.5* 8.4*  HCT 23.4* 27.0* 26.0*  PLT 153 156 149*  LABPROT 26.3* 24.0* 25.0*  INR 2.4* 2.1* 2.2*  CREATININE  --  3.15* 3.43*    Estimated Creatinine Clearance: 19.4 mL/min (A) (by C-G formula based on SCr of 3.43 mg/dL (H)).   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   Conductive hearing loss    uses hearing aids   Dysrhythmia    A-fib   History of blood transfusion 03/24/2023   Hypothyroidism    Intraspinal abscess    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   Nonischemic cardiomyopathy (HCC)    Psychosexual dysfunction with inhibited sexual excitement    S/P mitral valve replacement    Syncope and collapse    Unspecified sleep apnea    last sleep study 11/07, uses cpap   Ventricular tachycardia (HCC)    VT (ventricular tachycardia) (HCC)      Assessment: [name] with AFib and mMVR on warfarin PTA admitted with weakness and dyspnea. Pharmacy consulted for warfarin. INR on 10/15 was 4.3, and warfarin/Lovenox was held. Sounds like last dose of warfarin was 10/14. INR on admit 10/16, remained elevated at 4.2. Of note, patient was on Lovenox bridge with warfarin 10/7-14. PTA warfarin dose: 2.5 mg  daily  INR 2.2 (subtherapeutic). Most likely subtherapeutic d/t patients warfarin being held the last few days. PO intake has been minimal recently but improved. Will repeat a higher dose today. Discharge to SNF  Goal of Therapy:  INR goal 3-3.5 Monitor platelets by anticoagulation protocol: Yes   Plan:  Warfarin 4 mg x1 today before discharge Monitor daily INR, CBC, clinical course, s/sx of bleed, PO intake/diet, Drug-Drug Interactions  Ulyses Southward, PharmD, [place], AAHIVP, CPP Infectious Disease Pharmacist 05/18/2023 7:33 AM

## 2023-05-18 NOTE — TOC Transition Note (Signed)
Transition of Care [facility]) - CM/SW Discharge Note   Patient Details  Name: Austin [name] MRN: [id number] Date of Birth: [birth year]  Transition of Care [facility]) CM/SW Contact:  Deatra Robinson, [place] Phone Number: 05/18/2023, 2:33 PM   Clinical Narrative: spoke to Florentina Addison at [facility] who agreed to accept pt to their SNF with auth pending. Pt agreeable to this plan as well. DC summary and updated MD note from today sent to [facility]. Rn provided with number for report and [facility] to transport via their wheelchair [employer]. SW signing off at dc.   Dellie Burns, MSW, LCSW [phone] (coverage)      Final next level of care: Skilled Nursing Facility Barriers to Discharge: Barriers Resolved   Patient Goals and CMS Choice CMS Medicare.gov Compare Post Acute Care list provided to:: Patient Choice offered to / list presented to : Patient  Discharge Placement                Patient chooses bed at: [facility] Patient to be transferred to facility by: [facility] to transport Name of family member notified: Pt to update family Patient and family notified of of transfer: 05/18/23  Discharge Plan and Services Additional resources added to the After Visit Summary for   In-house Referral: Clinical Social Work                                   Social Determinants of Health (SDOH) Interventions SDOH Screenings   Food Insecurity: No Food Insecurity (05/12/2023)  Housing: Low Risk  (05/12/2023)  Transportation Needs: No Transportation Needs (05/12/2023)  Utilities: Not At Risk (05/12/2023)  Alcohol Screen: Low Risk  (03/08/2023)  Depression (PHQ2-9): Low Risk  (03/08/2023)  Financial Resource Strain: Low Risk  (03/08/2023)  Physical Activity: Insufficiently Active (03/08/2023)  Social Connections: Moderately Integrated (03/08/2023)  Stress: No Stress Concern Present (03/08/2023)  Tobacco Use: Low Risk  (05/17/2023)  Health Literacy: Adequate  Health Literacy (03/08/2023)     Readmission Risk Interventions     No data to display

## 2023-05-18 NOTE — Progress Notes (Addendum)
Pt seen and examined, remains medically stable for DC to SNF. DC summary done by Dr.Arrien yesterday, need Nephrology FU for CKD4 Admit date 05/11/2023 Discharge date 05/18/2023  Zannie Cove, MD

## 2023-05-18 NOTE — Plan of Care (Signed)

## 2023-05-18 NOTE — Plan of Care (Signed)
  Problem: Education: Goal: Knowledge of General Education information will improve Description: Including pain rating scale, medication(s)/side effects and non-pharmacologic comfort measures 05/18/2023 1500 by Reynold Bowen, RN Outcome: Adequate for Discharge 05/18/2023 1043 by Reynold Bowen, RN Outcome: Progressing   Problem: Health Behavior/Discharge Planning: Goal: Ability to manage health-related needs will improve 05/18/2023 1500 by Reynold Bowen, RN Outcome: Adequate for Discharge 05/18/2023 1043 by Reynold Bowen, RN Outcome: Progressing   Problem: Clinical Measurements: Goal: Ability to maintain clinical measurements within normal limits will improve 05/18/2023 1500 by Reynold Bowen, RN Outcome: Adequate for Discharge 05/18/2023 1043 by Reynold Bowen, RN Outcome: Progressing Goal: Will remain free from infection 05/18/2023 1500 by Reynold Bowen, RN Outcome: Adequate for Discharge 05/18/2023 1043 by Reynold Bowen, RN Outcome: Progressing Goal: Diagnostic test results will improve 05/18/2023 1500 by Reynold Bowen, RN Outcome: Adequate for Discharge 05/18/2023 1043 by Reynold Bowen, RN Outcome: Progressing Goal: Respiratory complications will improve Outcome: Adequate for Discharge Goal: Cardiovascular complication will be avoided Outcome: Adequate for Discharge   Problem: Activity: Goal: Risk for activity intolerance will decrease Outcome: Adequate for Discharge   Problem: Nutrition: Goal: Adequate nutrition will be maintained Outcome: Adequate for Discharge   Problem: Coping: Goal: Level of anxiety will decrease Outcome: Adequate for Discharge   Problem: Elimination: Goal: Will not experience complications related to bowel motility Outcome: Adequate for Discharge Goal: Will not experience complications related to urinary retention Outcome: Adequate for Discharge    Problem: Pain Managment: Goal: General experience of comfort will improve Outcome: Adequate for Discharge   Problem: Safety: Goal: Ability to remain free from injury will improve Outcome: Adequate for Discharge   Problem: Skin Integrity: Goal: Risk for impaired skin integrity will decrease Outcome: Adequate for Discharge

## 2023-05-19 ENCOUNTER — Telehealth (HOSPITAL_COMMUNITY): Payer: Self-pay

## 2023-05-19 ENCOUNTER — Encounter (HOSPITAL_COMMUNITY): Payer: Self-pay | Admitting: Cardiology

## 2023-05-19 ENCOUNTER — Non-Acute Institutional Stay (SKILLED_NURSING_FACILITY): Payer: Medicare Other | Admitting: Sports Medicine

## 2023-05-19 ENCOUNTER — Ambulatory Visit (HOSPITAL_COMMUNITY)
Admit: 2023-05-19 | Discharge: 2023-05-19 | Disposition: A | Discharge status: Home / Self Care | Payer: Medicare Other | Source: Ambulatory Visit | Attending: Cardiology | Admitting: Cardiology

## 2023-05-19 ENCOUNTER — Encounter: Payer: Self-pay | Admitting: Sports Medicine

## 2023-05-19 ENCOUNTER — Encounter (HOSPITAL_COMMUNITY): Payer: Medicare Other

## 2023-05-19 VITALS — BP 100/60 | HR 84 | Wt 189.8 lb

## 2023-05-19 DIAGNOSIS — Z8679 Personal history of other diseases of the circulatory system: Secondary | ICD-10-CM | POA: Diagnosis not present

## 2023-05-19 DIAGNOSIS — I428 Other cardiomyopathies: Secondary | ICD-10-CM | POA: Diagnosis not present

## 2023-05-19 DIAGNOSIS — N189 Chronic kidney disease, unspecified: Secondary | ICD-10-CM

## 2023-05-19 DIAGNOSIS — I5022 Chronic systolic (congestive) heart failure: Secondary | ICD-10-CM | POA: Diagnosis not present

## 2023-05-19 DIAGNOSIS — Z79899 Other long term (current) drug therapy: Secondary | ICD-10-CM | POA: Insufficient documentation

## 2023-05-19 DIAGNOSIS — N390 Urinary tract infection, site not specified: Secondary | ICD-10-CM | POA: Diagnosis not present

## 2023-05-19 DIAGNOSIS — I4719 Other supraventricular tachycardia: Secondary | ICD-10-CM | POA: Diagnosis not present

## 2023-05-19 DIAGNOSIS — N179 Acute kidney failure, unspecified: Secondary | ICD-10-CM | POA: Diagnosis not present

## 2023-05-19 DIAGNOSIS — I5043 Acute on chronic combined systolic (congestive) and diastolic (congestive) heart failure: Secondary | ICD-10-CM | POA: Diagnosis not present

## 2023-05-19 DIAGNOSIS — G4733 Obstructive sleep apnea (adult) (pediatric): Secondary | ICD-10-CM | POA: Insufficient documentation

## 2023-05-19 DIAGNOSIS — Z7901 Long term (current) use of anticoagulants: Secondary | ICD-10-CM | POA: Diagnosis not present

## 2023-05-19 DIAGNOSIS — Z952 Presence of prosthetic heart valve: Secondary | ICD-10-CM | POA: Insufficient documentation

## 2023-05-19 DIAGNOSIS — I472 Ventricular tachycardia, unspecified: Secondary | ICD-10-CM | POA: Insufficient documentation

## 2023-05-19 DIAGNOSIS — I4892 Unspecified atrial flutter: Secondary | ICD-10-CM | POA: Diagnosis not present

## 2023-05-19 DIAGNOSIS — D631 Anemia in chronic kidney disease: Secondary | ICD-10-CM

## 2023-05-19 DIAGNOSIS — I5082 Biventricular heart failure: Secondary | ICD-10-CM | POA: Insufficient documentation

## 2023-05-19 DIAGNOSIS — I4891 Unspecified atrial fibrillation: Secondary | ICD-10-CM

## 2023-05-19 DIAGNOSIS — N184 Chronic kidney disease, stage 4 (severe): Secondary | ICD-10-CM | POA: Insufficient documentation

## 2023-05-19 DIAGNOSIS — I5023 Acute on chronic systolic (congestive) heart failure: Secondary | ICD-10-CM | POA: Insufficient documentation

## 2023-05-19 DIAGNOSIS — R531 Weakness: Secondary | ICD-10-CM | POA: Diagnosis not present

## 2023-05-19 DIAGNOSIS — I509 Heart failure, unspecified: Secondary | ICD-10-CM | POA: Diagnosis not present

## 2023-05-19 DIAGNOSIS — I480 Paroxysmal atrial fibrillation: Secondary | ICD-10-CM | POA: Diagnosis not present

## 2023-05-19 DIAGNOSIS — E039 Hypothyroidism, unspecified: Secondary | ICD-10-CM

## 2023-05-19 MED ORDER — LEVOTHYROXINE SODIUM 100 MCG PO TABS: 100.0000 ug | ORAL_TABLET | Freq: Every day | ORAL | Status: DC

## 2023-05-19 MED ORDER — POTASSIUM CHLORIDE CRYS ER 20 MEQ PO TBCR: EXTENDED_RELEASE_TABLET | ORAL | Status: DC

## 2023-05-19 MED ORDER — TORSEMIDE 40 MG PO TABS: ORAL_TABLET | ORAL | Status: DC

## 2023-05-19 NOTE — Patient Instructions (Signed)
INCREASE Torsemide to 80 mg in the morning and 40 mg in the evening.  INCREASE Potassium to 40 mEq ( 2 tabs ) in the morning and 20 mEq ( 1 tab ) in the evening.  You have been referred to [facility]. They will call you to arrange your appointment.  Your physician recommends that you schedule a follow-up appointment in: 1 week as scheduled   If you have any questions or concerns before your next appointment please send Austin [name] a message through [facility] or call our office at [phone].    TO LEAVE A MESSAGE FOR THE NURSE SELECT OPTION 2, PLEASE LEAVE A MESSAGE INCLUDING: YOUR NAME DATE OF BIRTH CALL BACK NUMBER REASON FOR CALL**this is important as we prioritize the call backs  YOU WILL RECEIVE A CALL BACK THE SAME DAY AS LONG AS YOU CALL BEFORE 4:00 PM  At the [facility], you and your health needs are our priority. As part of our continuing mission to provide you with exceptional heart care, we have created designated Provider Care Teams. These Care Teams include your primary Cardiologist (physician) and Advanced Practice Providers (APPs- Physician Assistants and Nurse Practitioners) who all work together to provide you with the care you need, when you need it.   You may see any of the following providers on your designated Care Team at your next follow up: Dr Arvilla Meres Dr Marca Ancona Dr. Dorthula Nettles Dr. Clearnce Hasten Amy Filbert Schilder, NP Robbie Lis, [place] [facility] [place], [place] Brynda Peon, NP [place] Lee, NP Karle Plumber, PharmD   Please be sure to bring in all your medications bottles to every appointment.    Thank you for choosing [facility]

## 2023-05-19 NOTE — Telephone Encounter (Signed)
Referral faxed to [facility] on 05/19/23 per patient request.

## 2023-05-19 NOTE — Progress Notes (Signed)
Provider:  Venita Sheffield MD Location:   [facility]    Place of Service:  SNF [place] [facility]    PCP: Laurey Morale, MD Patient Care Team: Laurey Morale, MD as PCP - General (Cardiology) Marinus Maw, MD as PCP - Electrophysiology (Cardiology)  Extended Emergency Contact Information Primary Emergency Contact: [facility] Phone: [phone] Relation: Daughter Preferred language: English Secondary Emergency Contact: Raymondo Band Address: [address]          [place], [place] [address] [name] Home Phone: [phone] Mobile Phone: [phone] Relation: Spouse  Code Status:  Goals of Care: Advanced Directive information    05/12/2023   12:00 PM  Advanced Directives  Does Patient Have a Medical Advance Directive? Yes  Type of Advance Directive Living will  Does patient want to make changes to medical advance directive? No - Patient declined  Copy of Healthcare Power of Attorney in Chart? No - copy requested      No chief complaint on file.   HPI: Patient is a 82 y.o. male seen today for admission to readmission to skilled care after his recent hospitalization for acute on chronic systolic CHF  Pt seen and examined in his room.  Seems comfortable and does not appear to be in distress. Able to speak in full sentences States that he feels tired. Did therapy this morning. Pt is able to transfer himself  and walk few steps with his walker  Has exertional SOB, sleeps with head elevated,  Chronic lower extremity swelling Denies PND, chest pain.   Dysuria- pt c/o dysuria Denies lower abdominal pain, nausea, vomiting   Mechanical valve- inr 2.5- 3.5  Looks like he did not receive coumadin yesterday   JP drain  with minimal drainage     As per discharge summary  Admit date:     05/11/2023  Discharge date: 05/17/23   Discharge Diagnoses: Principal Problem:   Acute on chronic systolic CHF (congestive heart failure)  (HCC) Active Problems:   Atrial fibrillation, chronic (HCC)   Acquired hypothyroidism   Acute renal failure superimposed on stage 4 chronic kidney disease [facility])  Hospital Course  Hospital Course: '''Mr. Pinson was admitted to the hospital with the working diagnosis of heart failure decompensation.   82 yo male with the past medical history of heart failure, atrial fibrillation, sp mitral valve replacement, CKD and hypothyroidism who presented with dyspnea. Recent Baro-stimulator removal due to pseudomonas infection and chest wall hematoma, 04/12/23.  Reported 2 days of worsening dyspnea, associated with lower extremity edema and orthopnea. Worsening weakness right lower extremity. On his initial physical examination his blood pressure was 92/73, HR 84, RR 17 and 02 saturation 99%, lungs with no wheezing or rhonchi, heart with S1 and S2 present and regular, abdomen with no distention and positive lower extremity edema.    Na 132, K 6,2 Cl 97, bicarbonate 22, glucose 105 bun 48 cr 4.14 Mg 2.6  AST 99, ALT 62 BNP 1,224  High sensitive troponin 51 and 49  Wbc 9,6 hgb 8,0 plt 178  INR 4,2  TSH 12.9  Urine analysis SG 1,009, negative protein, negative leukocytes and negative hgb.    Head CT negative for hemorrhage, mas effect of midline shift. Chronic small vessel ischemic changes of the white matter and atrophy. 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  Hypodensity at the medial left temporal lobe potentially due to artifact though appearance an axial views makes if difficult to exclude acute to subacute  infarct.    Chest radiograph with hypoinflation, cardiomegaly with bilateral hilar vascular congestion, no infiltrates, pacemaker defibrillator in the right hemithorax with one right atrial lead, one right and one left ventricular leads.    EKG 84 bpm, left axis deviation, interventricular conduction delay, qtc 612, atrial and ventricular paced rhythm with no significant ST  segment or T wave changes.    Patient was placed on IV furosemide.    10/18 improvement in volume status, renal function not yet stable. 10/19 renal function stable, pending insurance authorization in order to return to SNF.  10/20 pending transfer to SNF. Will check in am with vascular surgery for drain evaluation. 10/21 hgb down to 7.1., transfusing today one unit PRBC. 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   Conductive hearing loss    uses hearing aids   Dysrhythmia    A-fib   History of blood transfusion 03/24/2023   Hypothyroidism    Intraspinal abscess    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   Nonischemic cardiomyopathy (HCC)    Psychosexual dysfunction with inhibited sexual excitement    S/P mitral valve replacement    Syncope and collapse    Unspecified sleep apnea    last sleep study 11/07, uses cpap   Ventricular tachycardia (HCC)    VT (ventricular tachycardia) (HCC)    Past Surgical History:  Procedure Laterality Date   BIV ICD GENERATOR CHANGEOUT N/A 05/04/2017   Procedure: BiV ICD Generator Changeout;  Surgeon: Marinus Maw, MD;  Location: [facility] INVASIVE CV LAB;  Service: Cardiovascular;  Laterality: N/A;   CARDIAC CATHETERIZATION  04/24/2002   CARDIAC VALVE REPLACEMENT     CARDIOVERSION  03/09/2012   Procedure: CARDIOVERSION;  Surgeon: Marinus Maw, MD;  Location[facility] ENDOSCOPY;  Service: Cardiovascular;  Laterality: N/A;   CARDIOVERSION N/A 11/22/2013   Procedure: CARDIOVERSION;  Surgeon: Thurmon Fair, MD;   Location: [facility] ENDOSCOPY;  Service: Cardiovascular;  Laterality: N/A;   dental implants     ELECTROPHYSIOLOGIC STUDY N/A 09/15/2015   Procedure: V Tach Ablation;  Surgeon: Marinus Maw, MD;  Location: [facility] INVASIVE CV LAB;  Service: Cardiovascular;  Laterality: N/A;   Evacution of epidural lumbar epidural abscess  07/26/1997   INCISION AND DRAINAGE OF WOUND Left 03/25/2023   Procedure: Washout of Chest Wall Hematoma;  Surgeon: Nada Libman, MD;  Location: [facility] OR;  Service: Vascular;  Laterality: Left;   INCISION AND DRAINAGE OF WOUND N/A 03/29/2023   Procedure: IRRIGATION AND DEBRIDEMENT  CHEST WALL;  Surgeon: Nada Libman, MD;  Location: [facility] OR;  Service: Vascular;  Laterality: N/A;   INSERT / REPLACE / REMOVE PACEMAKER  11/23/2008   IR US GUIDE BX ASP/DRAIN  04/30/2023   MITRAL VALVE REPLACEMENT     w #[id number] [facility]   PERMANENT PACEMAKER INSERTION N/A 08/05/2011   Procedure: PERMANENT PACEMAKER INSERTION;  Surgeon: Marinus Maw, MD;  Location: [facility] CATH LAB;  Service: Cardiovascular;  Laterality: N/A;   REMOVAL OF BAROSTIM N/A 04/12/2023   Procedure: REMOVAL OF [name];  Surgeon: Myra Gianotti,  Fran Lowes, MD;  Location: [facility] OR;  Service: Vascular;  Laterality: N/A;   RIGHT HEART CATH N/A 02/26/2021   Procedure: RIGHT HEART CATH;  Surgeon: Laurey Morale, MD;  Location: [facility] INVASIVE CV LAB;  Service: Cardiovascular;  Laterality: N/A;   THYROIDECTOMY     TONSILLECTOMY     V-TACH ABLATION  07/30/2021   in [place]   VALVE REPLACEMENT  07/26/1998    reports that he has never smoked. He has never used smokeless tobacco. He reports that he does not currently use alcohol. He reports that he does not use drugs. 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  Has one daughter.  Retired.  Education: Masters.   Social Determinants of Health   Financial Resource Strain: Low Risk  (03/08/2023)   Overall Financial Resource Strain (CARDIA)    Difficulty of Paying Living Expenses: Not hard at all  Food Insecurity: No Food Insecurity (05/12/2023)   Hunger Vital Sign    Worried About Running Out of Food in the Last Year: Never true    Ran Out of Food in the Last Year: Never true  Transportation Needs: No Transportation Needs (05/12/2023)   PRAPARE - Administrator, Civil Service (Medical): No    Lack of Transportation (Non-Medical): No  Physical Activity: Insufficiently Active (03/08/2023)   Exercise Vital Sign    Days of Exercise per Week: 4 days    Minutes of Exercise per Session: 30 min  Stress: No Stress Concern Present (03/08/2023)   [employer] of Occupational Health - Occupational Stress Questionnaire    Feeling of Stress : Not at all  Social Connections: Moderately Integrated (03/08/2023)   Social Connection and Isolation Panel [NHANES]    Frequency of Communication with Friends and Family: More than three times a week    Frequency of Social Gatherings with Friends and Family: Twice a week    Attends Religious Services: More than 4 times per year    Active Member of [employer] or Organizations: No    Attends Banker Meetings: Never    Marital Status: Married  Catering manager Violence: Not At Risk (05/12/2023)   Humiliation, Afraid, Rape, and Kick questionnaire    Fear of Current or Ex-Partner: No    Emotionally Abused: No    Physically Abused: No    Sexually Abused: No     Functional Status Survey:    Family History  Problem Relation Age of Onset   Heart disease Mother    Heart failure Mother    Heart disease Father    Heart failure Father    Sleep apnea Neg Hx     Health Maintenance  Topic Date Due   Zoster Vaccines- Shingrix (1 of 2) Never done   DTaP/Tdap/Td (1 - Tdap) 05/18/2013  COVID-19 Vaccine (4 - 2023-24 season) 03/27/2023   Medicare Annual Wellness (AWV)  03/07/2024   Pneumonia Vaccine 70+ Years old  Completed   INFLUENZA VACCINE  Completed   HPV VACCINES  Aged Out    No Known Allergies  Outpatient Encounter Medications as of 05/19/2023  Medication Sig   acetaminophen (TYLENOL) 500 MG tablet Take 1,000 mg by mouth every 8 (eight) hours as needed for headache, fever or mild pain.   amiodarone (PACERONE) 400 MG tablet Take 400 mg by mouth daily.   levothyroxine (SYNTHROID) 88 MCG tablet TAKE 1 TABLET BY MOUTH EVERY DAY   melatonin 5 MG TABS Take 5 mg by mouth at bedtime.   midodrine (PROAMATINE) 5 MG tablet Take 1 tablet (5 mg total) by mouth 3 (three) times daily with meals.   mirtazapine (REMERON) 7.5 MG tablet Take 1 tablet (7.5 mg total) by mouth at bedtime.   Multiple Vitamin (MULTIVITAMIN WITH MINERALS) TABS tablet Take 1 tablet by mouth in the morning.   mupirocin ointment (BACTROBAN) 2 % Apply 1 Application topically daily. (Patient taking differently: Apply 1 Application topically See admin instructions. Apply to wound site once daily in the morning with dressing change)   nitroGLYCERIN (NITROSTAT) 0.4 MG SL tablet Place 1 tablet (0.4 mg total) under the tongue every 5 (five) minutes x 3 doses as needed for chest pain.   potassium chloride SA (KLOR-CON M) 20 MEQ tablet Take 1 tablet (20 mEq total) by mouth 2 (two) times daily.   quiniDINE gluconate 324 MG CR tablet TAKE 1 TABLET BY MOUTH 2 TIMES DAILY.   torsemide 40 MG TABS Take 80 mg by mouth daily.   warfarin (COUMADIN) 2.5 MG tablet Take 1-2 tablets Daily or as  prescribed by [facility]. (Patient taking differently: Take 2.5 mg by mouth daily at 12 noon.)   No facility-administered encounter medications on file as of 05/19/2023.    Review of Systems  There were no vitals filed for this visit. There is no height or weight on file to calculate BMI. Physical Exam Constitutional:      Appearance: Normal appearance.  HENT:     Head: Normocephalic and atraumatic.  Cardiovascular:     Rate and Rhythm: Normal rate and regular rhythm.     Pulses: Normal pulses.     Heart sounds: Normal heart sounds.     Comments: JP drain  in place, no tenderness on palpation  Pulmonary:     Effort: No respiratory distress.     Breath sounds: No stridor. Rales (minimal left lung base) present. No wheezing.  Abdominal:     General: Bowel sounds are normal. There is no distension.     Palpations: Abdomen is soft.     Tenderness: There is no abdominal tenderness. There is no right CVA tenderness or guarding.  Musculoskeletal:        General: Swelling (1+ pitting odema) present.  Neurological:     Mental Status: He is alert. Mental status is at baseline.     Sensory: No sensory deficit.     Motor: No weakness.     Labs reviewed: Basic Metabolic Panel: Recent Labs    04/10/23 1405 04/11/23 0434 04/18/23 0306 04/19/23 0330 04/30/23 0400 05/01/23 0329 05/11/23 1832 05/11/23 2220 05/15/23 0246 05/17/23 0402 05/18/23 0305  NA 130*   < > 130*   < > 139   < > 132*   < > 136 140 139  K 4.0   < > 3.4*   < >  2.7*   < > 6.2*   < > 3.5 3.0* 3.6  CL 95*   < > 91*   < > 95*   < > 97*   < > 99 99 98  CO2 24   < > 27   < > 29   < > 22   < > 26 30 29   GLUCOSE 224*   < > 166*   < > 87   < > 105*   < > 91 85 103*  BUN 35*   < > 48*   < > 33*   < > 48*   < > 50* 40* 43*  CREATININE 2.28*   < > 3.88*   < > 2.66*   < > 4.14*   < > 3.73* 3.15* 3.43*  CALCIUM 8.0*   < > 8.2*   < > 7.9*   < > 8.3*   < > 8.1* 8.2* 8.0*  MG  --    < > 2.4  --  2.1  --  2.6*  --   --    --   --   PHOS 4.0  --   --   --   --   --   --   --   --   --   --    < > = values in this interval not displayed.   Liver Function Tests: Recent Labs    05/04/23 1516 05/08/23 2032 05/11/23 1832  AST 33 47* 99*  ALT 27 28 62*  ALKPHOS 103 107 121  BILITOT 1.0 1.3* 1.1  PROT 5.7* 6.3* 5.3*  ALBUMIN 3.0* 3.2* 2.9*   No results for input(s): "LIPASE", "AMYLASE" in the last 8760 hours. No results for input(s): "AMMONIA" in the last 8760 hours. CBC: Recent Labs    05/04/23 1750 05/08/23 2032 05/11/23 1832 05/12/23 0426 05/15/23 0246 05/16/23 0254 05/17/23 0402 05/18/23 0305  WBC 8.8   < > 9.6 11.4*   < > 7.4 7.8 7.4  NEUTROABS 7.0  --  7.3 9.1*  --   --   --   --   HGB 9.2*   < > 8.0* 7.4*   < > 7.1* 8.5* 8.4*  HCT 29.7*   < > 25.9* 23.9*   < > 23.4* 27.0* 26.0*  MCV 99.3   < > 100.0 99.2   < > 95.9 95.4 95.6  PLT 133*   < > 178 159   < > 153 156 149*   < > = values in this interval not displayed.   Cardiac Enzymes: No results for input(s): "CKTOTAL", "CKMB", "CKMBINDEX", "TROPONINI" in the last 8760 hours. BNP: Invalid input(s): "POCBNP" No results found for: "HGBA1C" Lab Results  Component Value Date   TSH 12.925 (H) 05/11/2023   Lab Results  Component Value Date   VITAMINB12 997 (H) 05/11/2023   Lab Results  Component Value Date   FOLATE 24.8 03/24/2023   Lab Results  Component Value Date   IRON 65 04/28/2023   TIBC 255 04/28/2023   FERRITIN 164 04/28/2023    Imaging and Procedures obtained prior to SNF admission: CT Head Wo Contrast  Result Date: 05/11/2023 CLINICAL DATA:  Mental status change EXAM: CT HEAD WITHOUT CONTRAST TECHNIQUE: Contiguous axial images were obtained from the base of the skull through the vertex without intravenous contrast. RADIATION DOSE REDUCTION: This exam was performed according to the departmental dose-optimization program which includes automated exposure control, adjustment of the mA and/or kV  according to patient size  and/or use of iterative reconstruction technique. COMPARISON:  CT brain 05/04/2023 FINDINGS: Brain: No hemorrhage or intracranial mass. 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 Chronic small vessel ischemic changes of the white matter and atrophy. Question hypodensity within the medial left temporal lobe, series 2 image 9 and 10, but appearance on sagittal views suggest potentially related to artifact. Vascular: No hyperdense vessels. Vertebral and carotid vascular calcification Skull: Normal. Negative for fracture or focal lesion. Sinuses/Orbits: No acute finding. Other: None IMPRESSION: 1. Negative for hemorrhage, mass, mass effect or midline shift 2. Chronic small vessel ischemic changes of the white matter and atrophy. 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 3. Hypodensity at the medial left temporal lobe potentially due to artifact though appearance on axial views makes it difficult to exclude acute to subacute infarct. Electronically Signed   By: Jasmine Pang M.D.   On: 05/11/2023 21:55   DG Chest Port 1 View  Result Date: 05/11/2023 CLINICAL DATA:  Weakness EXAM: PORTABLE CHEST 1 VIEW COMPARISON:  05/04/2023, 04/09/2023 FINDINGS: Post sternotomy changes and valve prosthesis. Right-sided multi lead pacing device as before. Low lung volumes. Cardiomegaly. Chronic elevation right diaphragm. Mild central congestion. No consolidation. Possible small left effusion. Removal of previously noted left upper chest drainage catheter IMPRESSION: Cardiomegaly with mild central congestion. Possible small left effusion. Electronically Signed   By: Jasmine Pang M.D.   On: 05/11/2023 21:50    Assessment/Plan  Acute on chronic systolic heart failure  History of VT and VT ablation, sp ICD  03/2023 echocardiogram with reduced LV systolic function EF 20%, global hypokinesis  On torsemide  80 mg daily  Will monitor for volume overload Daily weights  Will  check bmp next week  Avoid salty foods Water restriction  Follow up with cardiology  Hypotension  Bp 112/70  Cont with midodrine   Chest wall drain  Follow up with vascular surgery   Afib  Mechanical valve  Rate controlled Cont with amiodarone,  Pt did not receive coumadin yesterday , instructed nurse to restart coumadin 2.5 mg  Has INR drawn today   Hypothyroidism  Lab Results  Component Value Date   TSH 12.925 (H) 05/11/2023   Will increase levothyroxine to  Repeat TSH in 6-8 weeks   CKD stage 4  Lab Results  Component Value Date   CREATININE 3.43 (H) 05/18/2023   CREATININE 3.15 (H) 05/17/2023   CREATININE 3.73 (H) 05/15/2023   Will check bmp next week    Anemia of chronic renal disease     Latest Ref Rng & Units 05/18/2023    3:05 AM 05/17/2023    4:02 AM 05/16/2023    2:54 AM  CBC  WBC 4.0 - 10.5 K/uL 7.4  7.8  7.4   Hemoglobin 13.0 - 17.0 g/dL 8.4  8.5  7.1   Hematocrit 39.0 - 52.0 % 26.0  27.0  23.4   Platelets 150 - 400 K/uL 149  156  153    Will check cbc , bmp next week     Family/ staff Communication:  care plan discussed with the nursing staff  Labs/tests ordered: cbc, bmp   I spent greater than  50 minutes for the care of this patient in face to face time, chart review, clinical documentation, patient education.

## 2023-05-19 NOTE — Progress Notes (Addendum)
Advanced Heart Failure Clinic Note   PCP: Dr. Jacquenette Shone HF Cardiologist: Dr. Shirlee Latch  HPI: 82 y.o. with history of mechanical mitral valve s/p MV endocarditis, VT, nonischemic cardiomyopathy, complete heart block was referred by Dr. Katrinka Blazing for evaluation of CHF.  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 He subsequently developed a presumed nonischemic cardiomyopathy.  This was complicated by VT, he had a VT ablation in 2017 and had a Medtronic CRT-D device implanted. This had to be removed due to infection in 11/12 and was reimplanted in 1/13. Echo in 5/22 showed EF 30% with normal RV, mechanical MV stable with mean gradient 3 mmHg.  He has been in atrial fibrillation persistently since late 2021.  He is on amiodarone and has been cardioverted in the past. His LV lead is not functional.  He has complete heart block and is persistently RV pacing.    Had RHC 02/26/21 RA 4, PA 32/12, PCWP 11, CO 4.6, CI 2.2. Normal filling pressures and slightly low cardiac output.    Digoxin was stopped due to elevated level.    He saw Dr. Ladona Ridgel in 9/22 and was in slow VT in the office.  He was paced out of VT and amiodarone was increased to 200 mg daily.    He saw Dr. Ladona Ridgel, and placement of a left bundle or LV lead was decided to be too risky a procedure.  He was referred to Dr. Marquette Saa with the hope that he could have a VT ablation and get off amiodarone, which is likely playing a role in his symptoms.      S/p VT ablation with Dr. Marquette Saa 1/23.   Echo in 4/23 showed EF 20-25% with diffuse hypokinesis and septal-lateral dyssynchrony, mildly decreased RV systolic function, mechanical MV with normal function, dilated IVC.    He was seen in the office in 7/23, noted to be in atrial flutter in the atria and slow VT in the ventricles.  VT was pace terminated by Dr. Graciela Husbands and lower threshold set for ATP.  Later in 7/23, he had another VT episode and had an ICD discharge.   Quinidine was then started by Dr. Ladona Ridgel.    Follow up 10/23, had runs of VT terminated by ATP. Stable NYHA III and volume stable by OptiVol.   Echo 3/24 showed EF 20-25%, mildly decreased RV systolic function, moderate TR, IVC dilated, stable mechanical mitral valve with mean gradient 4 mmHg.    Follow up with EP 12/07/22, EKG showed atrial tach w/ ventricular pacing. Attempts were made to pace out but could not. Dr. Ladona Ridgel increased his ATP and turned off the VT therapy in the slow VT zone. Continued on amiodarone and quinidine.    Acute visit 5/24 with complaints of increased weakness, fatigue and near falls. In March, his PCP checked labs and noted that TSH was elevated at 10 and labs suggested anemia. Device interrogation showed euvolemia, frequent occurrences of AT/AF but of short durations. There was discussion of Barostim candidacy but his pro-BNP was > 3692. Concern for general deconditioning vs end-stage HF.  He subsequently saw [name] to look into barostimulation activation therapy.    Barosimtulator device implant 8/24 c/b chest wall hematoma, s/p I&D w/ placement of wound vac.    Readmitted on 04/06/23 for ICD shocks for VT and a/c CHF w/ volume overload. He was placed on IV amio. Rates improved but was in and out of slow VT during admission. Paced out  of VT 9/13 by EP. EP decreased pacing rate to 85 bpm. Had improvement and VT remained quiescent. Transitioned back to PO amiodarone 400 mg bid and quinidine. AHF team consulted for volume management. Diuresis was complicated by worsening renal fx/ AKI. He required inotropic support w/ milrinone which was eventually weaned off. Volume status improved as did renal function. Off GDMT due to AKI/shock. Once improved from a cardiac standpoint, he underwent barostimulator device removal by [facility] on 9/17. Wound cx grew Pseudomonas. ID consulted and recommended 10 day course of Ciprofloxacin. JP drain kept in until his follow up with Vascular  Surgery. He was discharged to [facility] on 04/21/23.   After discharge, the drain was removed. He developed reaccumulation of fluid in left chest wall and was readmitted on 10/04. Had another drain placed by IR.   He was readmitted in 10/24 with dyspnea, edema consistent with CHF exacerbation.  Creatinine up to 4.14. He was given IV Lasix and started on midodrine.   Today he returns for HF follow up. He remains at [facility] skilled nursing section. Weight stable.  He remains weak and fatigued.  He is working with PT.  He is able to walk in his room and can get to the dining room with his walker.  He is short of breath walking 50 feet.  He has orthopnea and sleeps on several pillows.  No chest pain.  No palpitations or lightheadedness.   ECG (personally reviewed): suspect a-paced and RV paced  Device interrogation (personally reviewed): OptiVol fluid index > threshold, no VT, 99% v-paced, no AF currently.   Labs (7/22): K 4.6, creatinine 1.3 Labs (8/22): K 4.4, creatinine 1.23 Labs (10/22): K 3.9, creatinine 1.36 Labs (12/22): K 4.2, creatinine 1.39 Labs (1/23): K 4.3, creatinine 1.15 Labs (2/23): TSH 10, K 4.1, creatinine 1.54 Labs (6/23): BNP 798, K 4.1, creatinine 1.3 Labs (7/23): BNP 1244, LFTs normal, K 4.2, creatinine 1.49 Labs (10/23): K 4, creatinine 1.72, TSH 5.7, transferrin saturation 28%, hgb 12.8, plts 128, LFTs normal Labs (3/24): K 4.9, creatinine 2.26, TSH 10.9, hgb 12.4 Labs (4/24): K 4.0, creatinine 2.88  Labs (5/24): K 4.3, creatinine 2.33, TSH 7.9  Labs (6/24): K 4.1, creatinine 2.74 Labs (10/24): K 4.3, creatinine 3.64 => 3.43, hgb 9.4 => 8.4, AST 99, ALT 62, TSH 12   PMH: 1. 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 2. H/o VT: VT ablation in 2017.  - Redo VT ablation in 1/23 with Dr. Marquette Saa - Arita Miss termination of VT in office in 7/23.  - ICD shock 7/23, started quinidine.  - VT 9/24  3. Atrial fibrillation/atypical  flutter: Persistent since 2021.  Failed amiodarone.  4. PAD: Tibial occlusive disease.  5. 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  6. Hypothyroidism.  7. Chronic systolic CHF: Presumed nonischemic cardiomyopathy.  Medtronic CRT-D device, LV lead is nonfunctional.  - Echo (2012): EF 45% - Echo (1/17): EF 30-35% - Echo (6/21): EF 15-20% - Echo (11/21): EF 30-35%  - Echo (5/22): EF 30%, normal RV, mechanical mitral valve mean gradient 3 mmHg.  - RHC (8/22): RA mean 4, RV 33/4,PA 32/12 mean 21, PCWP mean 11, Oxygen saturations:PA 71%, AO 98%, Cardiac Output (Fick) 4.66 Cardiac Index (Fick) 2.18 - Echo (4/23): EF 20-25% with diffuse hypokinesis and septal-lateral dyssynchrony, mildly decreased RV systolic function, mechanical MV with normal function, dilated IVC.  - Echo (3/24): EF 20-25%, mildly decreased RV systolic function, moderate TR, IVC dilated, stable mechanical mitral valve with  mean gradient 4 mmHg.  - Ltd echo (9/24): EF 20%, RV systolic function mildly reduced, mechanical MV mean gradient 8 mmhg, moderate TR, no thrombus 8. CKD stage IV  ROS: All systems negative except as listed in HPI, PMH and Problem List.  SH:  Social History   Socioeconomic History   Marital status: Married    Spouse name: Not on file   Number of children: 1   Years of education: 84   Highest education level: Master's degree (e.g., MA, MS, MEng, MEd, MSW, MBA)  Occupational History   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  Has one daughter.  Retired.  Education: Masters.   Social Determinants of Health   Financial Resource Strain: Low Risk  (03/08/2023)   Overall  Financial Resource Strain (CARDIA)    Difficulty of Paying Living Expenses: Not hard at all  Food Insecurity: No Food Insecurity (05/12/2023)   Hunger Vital Sign    Worried About Running Out of Food in the Last Year: Never true    Ran Out of Food in the Last Year: Never true  Transportation Needs: No Transportation Needs (05/12/2023)   PRAPARE - Administrator, Civil Service (Medical): No    Lack of Transportation (Non-Medical): No  Physical Activity: Insufficiently Active (03/08/2023)   Exercise Vital Sign    Days of Exercise per Week: 4 days    Minutes of Exercise per Session: 30 min  Stress: No Stress Concern Present (03/08/2023)   [employer] of Occupational Health - Occupational Stress Questionnaire    Feeling of Stress : Not at all  Social Connections: Moderately Integrated (03/08/2023)   Social Connection and Isolation Panel [NHANES]    Frequency of Communication with Friends and Family: More than three times a week    Frequency of Social Gatherings with Friends and Family: Twice a week    Attends Religious Services: More than 4 times per year    Active Member of [employer] or Organizations: No    Attends Banker Meetings: Never    Marital Status: Married  Catering manager Violence: Not At Risk (05/12/2023)   Humiliation, Afraid, Rape, and Kick questionnaire    Fear of Current or Ex-Partner: No    Emotionally Abused: No    Physically Abused: No    Sexually Abused: No   FH:  Family History  Problem Relation Age of Onset   Heart disease Mother    Heart failure Mother    Heart disease Father    Heart failure Father    Sleep apnea Neg Hx     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    cx by infection, explantation11/12 & reimplant 1/13   CHF (congestive heart failure) (HCC)    Chronic kidney disease  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   Conductive hearing loss    uses hearing aids   Dysrhythmia    A-fib   History of blood transfusion 03/24/2023   Hypothyroidism  Intraspinal abscess    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   Nonischemic cardiomyopathy (HCC)    Psychosexual dysfunction with inhibited sexual excitement    S/P mitral valve replacement    Syncope and collapse    Unspecified sleep apnea    last sleep study 11/07, uses cpap   Ventricular tachycardia (HCC)    VT (ventricular tachycardia) (HCC)     Current Outpatient Medications  Medication Sig Dispense Refill   acetaminophen (TYLENOL) 500 MG tablet Take 1,000 mg by mouth every 8 (eight) hours as needed for headache, fever or mild pain.     amiodarone (PACERONE) 400 MG tablet Take 400 mg by mouth daily.     levothyroxine (SYNTHROID) 100 MCG tablet Take 1 tablet (100 mcg total) by mouth daily.     melatonin 5 MG TABS Take 5 mg by mouth at bedtime.     midodrine (PROAMATINE) 5 MG tablet Take 1 tablet (5 mg total) by mouth 3 (three) times daily with meals. 90 tablet 0   mirtazapine (REMERON) 7.5 MG tablet Take 1 tablet (7.5 mg total) by mouth at bedtime.     Multiple Vitamin (MULTIVITAMIN WITH MINERALS) TABS tablet Take 1 tablet by mouth in the morning.     mupirocin ointment (BACTROBAN) 2 % Apply 1 Application topically daily. 30 g 2   nitroGLYCERIN (NITROSTAT) 0.4 MG SL tablet Place 1 tablet (0.4 mg total) under the tongue every 5 (five) minutes x 3 doses as needed for chest pain. 25 tablet 2   quiniDINE gluconate 324 MG CR tablet TAKE 1 TABLET BY MOUTH 2 TIMES DAILY. 180 tablet 3   potassium chloride SA (KLOR-CON M) 20 MEQ tablet Take 2 tablets (40 mEq total) by mouth every morning AND 1 tablet (20 mEq total) every evening.     Torsemide 40 MG TABS Take 80 mg by mouth every morning AND 40 mg every evening.     warfarin (COUMADIN) 2.5 MG tablet Take 1-2 tablets Daily or as prescribed by Coumadin  Clinic. (Patient not taking: Reported on 05/19/2023) 30 tablet 1   No current facility-administered medications for this encounter.   BP 100/60   Pulse 84   Wt 86.1 kg (189 lb 12.8 oz)   SpO2 98%   BMI 25.04 kg/m   Wt Readings from Last 3 Encounters:  05/19/23 86.1 kg (189 lb 12.8 oz)  05/18/23 84.3 kg (185 lb 12.8 oz)  05/17/23 87.9 kg (193 lb 11.2 oz)   PHYSICAL EXAM: General: NAD Neck: JVP 10-12 cm, no thyromegaly or thyroid nodule.  Lungs: Mild crackles at bases bilaterally CV: Nondisplaced PMI.  Heart regular S1/S2 with mechanical S1, no S3/S4, 1/6 SEM RUSB.  2+ edema to knees.  No carotid bruit.  Normal pedal pulses.  Abdomen: Soft, nontender, no hepatosplenomegaly, no distention.  Skin: Intact without lesions or rashes.  Neurologic: Alert and oriented x 3.  Psych: Normal affect. Extremities: No clubbing or cyanosis.  HEENT: Normal.   ASSESSMENT & PLAN:  1. VT: History of VT and VT ablation in 2017.  MDT ICD.  Redo VT ablation in 1/23 (Dr. Marquette Saa).  He is on amiodarone, this was increased to 200 mg daily as outpatient with recurrent VT.  He has tolerated amiodarone poorly historically. In 7/23, slow VT was pace-terminated in the office, then he had an ICD shock for VT later in 7/23.  He was started on quinidine after this.  He is followed by Dr. Ladona Ridgel.  9/24 had 2 further ICD shocks for VT prior to admission, then had slow VT during hospitalization.  Paced out of VT 04/08/23 by EP.  At baseline, he RV paces most of the time. EP decreased pacing rate to 85 bpm.  - Continue amiodarone 400 mg daily.  LFTs elevated recently, will need to follow closely (may be due to hepatic congestion with volume overload).   - Continue quinidine.  2. Acute on chronic systolic CHF: Long-standing, suspected nonischemic cardiomyopathy.  Had RHC on 02/26/21 with normal filling pressures and mildly low cardiac output.  Echo in 4/23 showed  EF 20-25% with diffuse hypokinesis and septal-lateral  dyssynchrony, mildly decreased RV systolic function, mechanical MV with normal function, dilated IVC.  He has a Medtronic CRT-D device present, but the LV lead is not functional.  Echo 9/24 with EF 20%, mechanical MV mean gradient 8 mmHg.  Barostimulator placed but had to be removed due to chest wall hemorrhage.  NYHA IIIb-IV, he has had a significant function decline over past year, more so since prolonged hospitalization. He remains volume overloaded on exam and by OptiVol. GDMT and diuresis are further complicated by deteriorating renal function and low BP. - BP more stable on midodrine 5 mg tid, continue.  - Increase torsemide to 80 mg qam/40 mg qpm and increase KCl to 40 qam/20 qpm.  BMET/BNP in 1 week.   - Off spironolactone, valsartan, and Toprol XL with low SBP and CKD.  - Off digoxin due to persistently elevated level.  - He did not tolerate SGLT2 inhibitor in the past.  - The patient has CHB and has been chronically RV pacing as his LV lead is nonfunctional.  This is not ideal and creates dyssynchrony with a very wide QRS.  Left bundle lead placement discussed in past with Dr. Ladona Ridgel but thought risk would be too high and left bundle lead was not attempted.   - He has advanced biventricular heart failure. Now likely end-stage. He is unable to tolerate GDMT due to hypotension and renal impairment. If we cannot diurese him effectively, will need to consider GOC discussions. He is not an HD candidate. He understands that our options for him are very limited at this point.  3. Atrial fibrillation/flutter, atrial tachycardia: Paroxysmal.  He is a-paced today.  - Continue amiodarone. See above. - Continue warfarin.  4. Complete heart block: Patient is pacer dependent due to CHB.  - See above, upgrade to functional CRT thought to be too risky due to need to extract old CS lead.  5. OSA: Severe. Continue CPAP 6. Mechanical mitral valve: Mean gradient higher on echo 9/24 at 8 mmHg.  - Continue  warfarin INR goal 2.5-3.5.  7. Chest wall hematoma: post barostimulator complication, in setting of coumadin for mechanical MV.  s/p I&D for evacuation of hematoma and placement of wound vac + placement of antibiotic beads on 03/25/23. Barostimulator device removed 04/12/23. Wound cx with Pseudomonas.  - Follow with vascular re: drain.  8. CKD stage IV: Creatinine baseline had been 1.5-2.  Recent AKI, most recent creatinine 3.43.  - Not candidate for HD given age and end-stage HF.  9. Elevated LFTs: This may be due to CHF/volume overload with passive hepatic congestion. However, he is on amiodarone.   - Follow LFTs.   Follow up in 1 week with APP to reassess volume.    Marca Ancona 05/19/23

## 2023-05-19 NOTE — Addendum Note (Signed)
Encounter addended by: Laurey Morale, MD on: 05/19/2023 11:40 PM  Actions taken: Clinical Note Signed

## 2023-05-20 DIAGNOSIS — I472 Ventricular tachycardia, unspecified: Secondary | ICD-10-CM | POA: Diagnosis not present

## 2023-05-20 DIAGNOSIS — Z952 Presence of prosthetic heart valve: Secondary | ICD-10-CM | POA: Diagnosis not present

## 2023-05-21 DIAGNOSIS — Z952 Presence of prosthetic heart valve: Secondary | ICD-10-CM | POA: Diagnosis not present

## 2023-05-23 ENCOUNTER — Encounter: Payer: Self-pay | Admitting: Sports Medicine

## 2023-05-23 ENCOUNTER — Non-Acute Institutional Stay (SKILLED_NURSING_FACILITY): Payer: Medicare Other | Admitting: Sports Medicine

## 2023-05-23 DIAGNOSIS — Z5181 Encounter for therapeutic drug level monitoring: Secondary | ICD-10-CM

## 2023-05-23 DIAGNOSIS — R6883 Chills (without fever): Secondary | ICD-10-CM

## 2023-05-23 DIAGNOSIS — Z7901 Long term (current) use of anticoagulants: Secondary | ICD-10-CM

## 2023-05-23 DIAGNOSIS — I472 Ventricular tachycardia, unspecified: Secondary | ICD-10-CM | POA: Diagnosis not present

## 2023-05-23 DIAGNOSIS — E039 Hypothyroidism, unspecified: Secondary | ICD-10-CM | POA: Diagnosis not present

## 2023-05-23 DIAGNOSIS — I5043 Acute on chronic combined systolic (congestive) and diastolic (congestive) heart failure: Secondary | ICD-10-CM | POA: Diagnosis not present

## 2023-05-23 DIAGNOSIS — R5383 Other fatigue: Secondary | ICD-10-CM

## 2023-05-23 NOTE — Progress Notes (Signed)
Provider:  Venita Sheffield MD Location:   [facility]   Place of Service:   Skilled care   PCP: Laurey Morale, MD Patient Care Team: Laurey Morale, MD as PCP - General (Cardiology) Marinus Maw, MD as PCP - Electrophysiology (Cardiology)  Extended Emergency Contact Information Primary Emergency Contact: [facility] Phone: [phone] Relation: Daughter Preferred language: English Secondary Emergency Contact: Raymondo Band Address: [address]          [place], [place] [address] [name] Home Phone: [phone] Mobile Phone: [phone] Relation: Spouse  Code Status:  Goals of Care: Advanced Directive information    05/12/2023   12:00 PM  Advanced Directives  Does Patient Have a Medical Advance Directive? Yes  Type of Advance Directive Living will  Does patient want to make changes to medical advance directive? No - Patient declined  Copy of Healthcare Power of Attorney in Chart? No - copy requested      No chief complaint on file.   HPI: Patient is a 82 y.o. male seen today for acute visit  for chills. Nurse reported that pt stated to her that he is feeling weak, tired and having chills.  Pt seen and examined in his room, he is lying on his bed, covered with blanket. States he feels cold and having chills.  He did physical therapy this morning and feeling tired.  Pt has exertional SOB but reports no recent change. Denies chest pain, palpitations, dizzy or lightheadedness, abdominal pain, nausea, vomiting, dysuria, hematuria.    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   Conductive hearing loss    uses hearing aids   Dysrhythmia     A-fib   History of blood transfusion 03/24/2023   Hypothyroidism    Intraspinal abscess    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   Nonischemic cardiomyopathy (HCC)    Psychosexual dysfunction with inhibited sexual excitement    S/P mitral valve replacement    Syncope and collapse    Unspecified sleep apnea    last sleep study 11/07, uses cpap   Ventricular tachycardia (HCC)    VT (ventricular tachycardia) (HCC)    Past Surgical History:  Procedure Laterality Date   BIV ICD GENERATOR CHANGEOUT N/A 05/04/2017   Procedure: BiV ICD Generator Changeout;  Surgeon: Marinus Maw, MD;  Location: [facility] INVASIVE CV LAB;  Service: Cardiovascular;  Laterality: N/A;   CARDIAC CATHETERIZATION  04/24/2002   CARDIAC VALVE REPLACEMENT     CARDIOVERSION  03/09/2012   Procedure: CARDIOVERSION;  Surgeon: Marinus Maw, MD;  Location: [facility] ENDOSCOPY;  Service: Cardiovascular;  Laterality: N/A;   CARDIOVERSION N/A 11/22/2013   Procedure: CARDIOVERSION;  Surgeon: Thurmon Fair, MD;  Location: [facility] ENDOSCOPY;  Service: Cardiovascular;  Laterality: N/A;   dental implants     ELECTROPHYSIOLOGIC STUDY N/A 09/15/2015   Procedure: V Tach Ablation;  Surgeon: Marinus Maw, MD;  Location: [facility] INVASIVE CV LAB;  Service: Cardiovascular;  Laterality: N/A;   Evacution of epidural lumbar epidural abscess  07/26/1997   INCISION AND DRAINAGE OF WOUND Left 03/25/2023   Procedure: Washout of Chest Wall Hematoma;  Surgeon: Nada Libman, MD;  Location: [facility] OR;  Service: Vascular;  Laterality: Left;   INCISION AND DRAINAGE OF WOUND N/A 03/29/2023   Procedure: IRRIGATION AND DEBRIDEMENT  CHEST WALL;  Surgeon: Nada Libman, MD;  Location: [facility] OR;  Service: Vascular;  Laterality: N/A;   INSERT / REPLACE / REMOVE PACEMAKER  11/23/2008   IR US GUIDE BX ASP/DRAIN  04/30/2023   MITRAL VALVE REPLACEMENT     w #[id number] [facility]   PERMANENT PACEMAKER INSERTION N/A 08/05/2011   Procedure:  PERMANENT PACEMAKER INSERTION;  Surgeon: Marinus Maw, MD;  Location: [facility] CATH LAB;  Service: Cardiovascular;  Laterality: N/A;   REMOVAL OF BAROSTIM N/A 04/12/2023   Procedure: REMOVAL OF [name];  Surgeon: Nada Libman, MD;  Location: [facility] OR;  Service: Vascular;  Laterality: N/A;   RIGHT HEART CATH N/A 02/26/2021   Procedure: RIGHT HEART CATH;  Surgeon: Laurey Morale, MD;  Location: [facility] INVASIVE CV LAB;  Service: Cardiovascular;  Laterality: N/A;   THYROIDECTOMY     TONSILLECTOMY     V-TACH ABLATION  07/30/2021   in [place]   VALVE REPLACEMENT  07/26/1998    reports that he has never smoked. He has never used smokeless tobacco. He reports that he does not currently use alcohol. He reports that he does not use drugs. Social History   Socioeconomic History   Marital status: Married    Spouse name: Not on file   Number of children: 1   Years of education: 43   Highest education level: Master's degree (e.g., MA, MS, MEng, MEd, MSW, MBA)  Occupational History   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  Has one daughter.  Retired.  Education: Masters.   Social Determinants of Health   Financial Resource Strain: Low Risk  (03/08/2023)   Overall Financial Resource Strain (CARDIA)    Difficulty of Paying Living Expenses: Not hard at all  Food Insecurity: No Food Insecurity (05/12/2023)   Hunger Vital Sign    Worried About Running Out of Food in the Last Year: Never true    Ran Out of Food in the Last Year: Never true  Transportation Needs: No Transportation Needs (05/12/2023)   PRAPARE - Administrator, Civil Service (Medical): No     Lack of Transportation (Non-Medical): No  Physical Activity: Insufficiently Active (03/08/2023)   Exercise Vital Sign    Days of Exercise per Week: 4 days    Minutes of Exercise per Session: 30 min  Stress: No Stress Concern Present (03/08/2023)   [employer] of Occupational Health - Occupational Stress Questionnaire    Feeling of Stress : Not at all  Social Connections: Moderately Integrated (03/08/2023)   Social Connection and Isolation Panel [NHANES]    Frequency of Communication with Friends and Family: More than three times a week    Frequency of Social Gatherings with Friends and Family: Twice a week    Attends Religious Services: More than 4 times per year    Active Member of [employer] or Organizations: No    Attends Banker Meetings: Never  Marital Status: Married  Catering manager Violence: Not At Risk (05/12/2023)   Humiliation, Afraid, Rape, and Kick questionnaire    Fear of Current or Ex-Partner: No    Emotionally Abused: No    Physically Abused: No    Sexually Abused: No    Functional Status Survey:    Family History  Problem Relation Age of Onset   Heart disease Mother    Heart failure Mother    Heart disease Father    Heart failure Father    Sleep apnea Neg Hx     Health Maintenance  Topic Date Due   Zoster Vaccines- Shingrix (1 of 2) Never done   DTaP/Tdap/Td (1 - Tdap) 05/18/2013   COVID-19 Vaccine (4 - 2023-24 season) 03/27/2023   Medicare Annual Wellness (AWV)  03/07/2024   Pneumonia Vaccine 23+ Years old  Completed   INFLUENZA VACCINE  Completed   HPV VACCINES  Aged Out    No Known Allergies  Outpatient Encounter Medications as of 05/23/2023  Medication Sig   acetaminophen (TYLENOL) 500 MG tablet Take 1,000 mg by mouth every 8 (eight) hours as needed for headache, fever or mild pain.   amiodarone (PACERONE) 400 MG tablet Take 400 mg by mouth daily.   levothyroxine (SYNTHROID) 100 MCG tablet Take 1 tablet (100 mcg total) by  mouth daily.   melatonin 5 MG TABS Take 5 mg by mouth at bedtime.   midodrine (PROAMATINE) 5 MG tablet Take 1 tablet (5 mg total) by mouth 3 (three) times daily with meals.   mirtazapine (REMERON) 7.5 MG tablet Take 1 tablet (7.5 mg total) by mouth at bedtime.   Multiple Vitamin (MULTIVITAMIN WITH MINERALS) TABS tablet Take 1 tablet by mouth in the morning.   mupirocin ointment (BACTROBAN) 2 % Apply 1 Application topically daily.   nitroGLYCERIN (NITROSTAT) 0.4 MG SL tablet Place 1 tablet (0.4 mg total) under the tongue every 5 (five) minutes x 3 doses as needed for chest pain.   potassium chloride SA (KLOR-CON M) 20 MEQ tablet Take 2 tablets (40 mEq total) by mouth every morning AND 1 tablet (20 mEq total) every evening.   quiniDINE gluconate 324 MG CR tablet TAKE 1 TABLET BY MOUTH 2 TIMES DAILY.   Torsemide 40 MG TABS Take 80 mg by mouth every morning AND 40 mg every evening.   warfarin (COUMADIN) 2.5 MG tablet Take 1-2 tablets Daily or as prescribed by [facility]. (Patient not taking: Reported on 05/19/2023)   No facility-administered encounter medications on file as of 05/23/2023.    Review of Systems  Constitutional:  Positive for chills. Negative for fever.  HENT:  Negative for sinus pressure and sore throat.   Respiratory:  Positive for shortness of breath. Negative for cough and wheezing.   Cardiovascular:  Positive for leg swelling. Negative for chest pain and palpitations.  Gastrointestinal:  Negative for abdominal distention, abdominal pain, blood in stool, constipation, diarrhea, nausea and vomiting.  Genitourinary:  Negative for dysuria, frequency and urgency.  Neurological:  Negative for dizziness, weakness and numbness.  Psychiatric/Behavioral:  Negative for confusion.     There were no vitals filed for this visit. There is no height or weight on file to calculate BMI. Physical Exam Constitutional:      Appearance: Normal appearance.  HENT:     Head: Normocephalic  and atraumatic.  Cardiovascular:     Rate and Rhythm: Normal rate and regular rhythm.     Pulses: Normal pulses.     Heart sounds:  Normal heart sounds.  Pulmonary:     Effort: No respiratory distress.     Breath sounds: No stridor. No wheezing or rales.  Abdominal:     General: Bowel sounds are normal. There is no distension.     Palpations: Abdomen is soft.     Tenderness: There is no abdominal tenderness. There is no right CVA tenderness or guarding.  Musculoskeletal:        General: Swelling (1+ pitting odema) present.  Neurological:     Mental Status: He is alert. Mental status is at baseline.     Sensory: No sensory deficit.     Motor: No weakness.     Labs reviewed: Basic Metabolic Panel: Recent Labs    04/10/23 1405 04/11/23 0434 04/18/23 0306 04/19/23 0330 04/30/23 0400 05/01/23 0329 05/11/23 1832 05/11/23 2220 05/15/23 0246 05/17/23 0402 05/18/23 0305  NA 130*   < > 130*   < > 139   < > 132*   < > 136 140 139  K 4.0   < > 3.4*   < > 2.7*   < > 6.2*   < > 3.5 3.0* 3.6  CL 95*   < > 91*   < > 95*   < > 97*   < > 99 99 98  CO2 24   < > 27   < > 29   < > 22   < > 26 30 29   GLUCOSE 224*   < > 166*   < > 87   < > 105*   < > 91 85 103*  BUN 35*   < > 48*   < > 33*   < > 48*   < > 50* 40* 43*  CREATININE 2.28*   < > 3.88*   < > 2.66*   < > 4.14*   < > 3.73* 3.15* 3.43*  CALCIUM 8.0*   < > 8.2*   < > 7.9*   < > 8.3*   < > 8.1* 8.2* 8.0*  MG  --    < > 2.4  --  2.1  --  2.6*  --   --   --   --   PHOS 4.0  --   --   --   --   --   --   --   --   --   --    < > = values in this interval not displayed.   Liver Function Tests: Recent Labs    05/04/23 1516 05/08/23 2032 05/11/23 1832  AST 33 47* 99*  ALT 27 28 62*  ALKPHOS 103 107 121  BILITOT 1.0 1.3* 1.1  PROT 5.7* 6.3* 5.3*  ALBUMIN 3.0* 3.2* 2.9*   No results for input(s): "LIPASE", "AMYLASE" in the last 8760 hours. No results for input(s): "AMMONIA" in the last 8760 hours. CBC: Recent Labs     05/04/23 1750 05/08/23 2032 05/11/23 1832 05/12/23 0426 05/15/23 0246 05/16/23 0254 05/17/23 0402 05/18/23 0305  WBC 8.8   < > 9.6 11.4*   < > 7.4 7.8 7.4  NEUTROABS 7.0  --  7.3 9.1*  --   --   --   --   HGB 9.2*   < > 8.0* 7.4*   < > 7.1* 8.5* 8.4*  HCT 29.7*   < > 25.9* 23.9*   < > 23.4* 27.0* 26.0*  MCV 99.3   < > 100.0 99.2   < > 95.9 95.4 95.6  PLT  133*   < > 178 159   < > 153 156 149*   < > = values in this interval not displayed.   Cardiac Enzymes: No results for input(s): "CKTOTAL", "CKMB", "CKMBINDEX", "TROPONINI" in the last 8760 hours. BNP: Invalid input(s): "POCBNP" No results found for: "HGBA1C" Lab Results  Component Value Date   TSH 12.925 (H) 05/11/2023   Lab Results  Component Value Date   VITAMINB12 997 (H) 05/11/2023   Lab Results  Component Value Date   FOLATE 24.8 03/24/2023   Lab Results  Component Value Date   IRON 65 04/28/2023   TIBC 255 04/28/2023   FERRITIN 164 04/28/2023    Imaging and Procedures obtained prior to SNF admission: No results found.  Assessment/Plan 1. Chills Pt is afebrile  JP drain -incision site no drainage  Denies runny nose, sorethroat, myalgias  Will monitor vitals  Will get cbc, bmp Will check flu/ covid test  2. Fatigue, unspecified type Pt has end stage biventricular heart failure  Hypothyroidism  Will check cbc, bmp  3. Acute on chronic combined systolic and diastolic heart failure (HCC) Lungs clear  Lower extremity oedema+ Pt does not appear to be in distress Cont with torsemide Salt restriction  4. Anticoagulation goal of INR 2.5 to 3.5 INR 2.7 yesterday  Cont with coumadin   5. Hypothyroidism, unspecified type Synthroid increased to 100 mcg    Family/ staff Communication: care plan discussed with the nursing staff  Labs/tests ordered: cbc, bmp I spent greater than  35   minutes for the care of this patient in face to face time, chart review, clinical documentation, patient education.

## 2023-05-24 ENCOUNTER — Encounter: Payer: Self-pay | Admitting: Nurse Practitioner

## 2023-05-24 ENCOUNTER — Encounter: Payer: Self-pay | Admitting: Family Medicine

## 2023-05-24 DIAGNOSIS — I5023 Acute on chronic systolic (congestive) heart failure: Secondary | ICD-10-CM | POA: Diagnosis not present

## 2023-05-25 ENCOUNTER — Ambulatory Visit (INDEPENDENT_AMBULATORY_CARE_PROVIDER_SITE_OTHER): Payer: Medicare Other

## 2023-05-25 ENCOUNTER — Inpatient Hospital Stay (HOSPITAL_COMMUNITY)
Admission: EM | Admit: 2023-05-25 | Discharge: 2023-05-27 | DRG: 291 | Disposition: E | Payer: Medicare Other | Source: Skilled Nursing Facility | Attending: Cardiology | Admitting: Cardiology

## 2023-05-25 ENCOUNTER — Encounter: Payer: Self-pay | Admitting: Nurse Practitioner

## 2023-05-25 ENCOUNTER — Non-Acute Institutional Stay (SKILLED_NURSING_FACILITY): Payer: Medicare Other | Admitting: Nurse Practitioner

## 2023-05-25 ENCOUNTER — Emergency Department (HOSPITAL_COMMUNITY): Payer: Medicare Other

## 2023-05-25 ENCOUNTER — Encounter (HOSPITAL_COMMUNITY): Payer: Self-pay | Admitting: Pharmacy Technician

## 2023-05-25 ENCOUNTER — Other Ambulatory Visit: Payer: Self-pay

## 2023-05-25 ENCOUNTER — Encounter (HOSPITAL_COMMUNITY): Payer: Medicare Other

## 2023-05-25 DIAGNOSIS — N179 Acute kidney failure, unspecified: Secondary | ICD-10-CM | POA: Diagnosis not present

## 2023-05-25 DIAGNOSIS — J9601 Acute respiratory failure with hypoxia: Secondary | ICD-10-CM | POA: Diagnosis not present

## 2023-05-25 DIAGNOSIS — T82110A Breakdown (mechanical) of cardiac electrode, initial encounter: Secondary | ICD-10-CM | POA: Diagnosis present

## 2023-05-25 DIAGNOSIS — Z743 Need for continuous supervision: Secondary | ICD-10-CM | POA: Diagnosis not present

## 2023-05-25 DIAGNOSIS — I5022 Chronic systolic (congestive) heart failure: Secondary | ICD-10-CM

## 2023-05-25 DIAGNOSIS — N184 Chronic kidney disease, stage 4 (severe): Secondary | ICD-10-CM

## 2023-05-25 DIAGNOSIS — I4892 Unspecified atrial flutter: Secondary | ICD-10-CM | POA: Diagnosis present

## 2023-05-25 DIAGNOSIS — I472 Ventricular tachycardia, unspecified: Secondary | ICD-10-CM

## 2023-05-25 DIAGNOSIS — E032 Hypothyroidism due to medicaments and other exogenous substances: Secondary | ICD-10-CM | POA: Diagnosis not present

## 2023-05-25 DIAGNOSIS — I5084 End stage heart failure: Principal | ICD-10-CM | POA: Diagnosis present

## 2023-05-25 DIAGNOSIS — I482 Chronic atrial fibrillation, unspecified: Secondary | ICD-10-CM

## 2023-05-25 DIAGNOSIS — Z7901 Long term (current) use of anticoagulants: Secondary | ICD-10-CM

## 2023-05-25 DIAGNOSIS — Z86718 Personal history of other venous thrombosis and embolism: Secondary | ICD-10-CM

## 2023-05-25 DIAGNOSIS — Z7189 Other specified counseling: Secondary | ICD-10-CM

## 2023-05-25 DIAGNOSIS — I442 Atrioventricular block, complete: Secondary | ICD-10-CM | POA: Diagnosis not present

## 2023-05-25 DIAGNOSIS — Y838 Other surgical procedures as the cause of abnormal reaction of the patient, or of later complication, without mention of misadventure at the time of the procedure: Secondary | ICD-10-CM | POA: Diagnosis present

## 2023-05-25 DIAGNOSIS — Z66 Do not resuscitate: Secondary | ICD-10-CM | POA: Diagnosis not present

## 2023-05-25 DIAGNOSIS — Z515 Encounter for palliative care: Secondary | ICD-10-CM

## 2023-05-25 DIAGNOSIS — R627 Adult failure to thrive: Secondary | ICD-10-CM | POA: Diagnosis present

## 2023-05-25 DIAGNOSIS — E890 Postprocedural hypothyroidism: Secondary | ICD-10-CM | POA: Diagnosis not present

## 2023-05-25 DIAGNOSIS — I5023 Acute on chronic systolic (congestive) heart failure: Secondary | ICD-10-CM | POA: Diagnosis not present

## 2023-05-25 DIAGNOSIS — R0989 Other specified symptoms and signs involving the circulatory and respiratory systems: Secondary | ICD-10-CM | POA: Diagnosis not present

## 2023-05-25 DIAGNOSIS — Z1152 Encounter for screening for COVID-19: Secondary | ICD-10-CM

## 2023-05-25 DIAGNOSIS — R570 Cardiogenic shock: Secondary | ICD-10-CM | POA: Diagnosis not present

## 2023-05-25 DIAGNOSIS — I428 Other cardiomyopathies: Secondary | ICD-10-CM | POA: Diagnosis not present

## 2023-05-25 DIAGNOSIS — Z79890 Hormone replacement therapy (postmenopausal): Secondary | ICD-10-CM

## 2023-05-25 DIAGNOSIS — E872 Acidosis, unspecified: Secondary | ICD-10-CM | POA: Diagnosis not present

## 2023-05-25 DIAGNOSIS — Z711 Person with feared health complaint in whom no diagnosis is made: Secondary | ICD-10-CM | POA: Diagnosis not present

## 2023-05-25 DIAGNOSIS — D638 Anemia in other chronic diseases classified elsewhere: Secondary | ICD-10-CM | POA: Diagnosis not present

## 2023-05-25 DIAGNOSIS — I5082 Biventricular heart failure: Secondary | ICD-10-CM | POA: Diagnosis not present

## 2023-05-25 DIAGNOSIS — I110 Hypertensive heart disease with heart failure: Principal | ICD-10-CM | POA: Diagnosis present

## 2023-05-25 DIAGNOSIS — Z8249 Family history of ischemic heart disease and other diseases of the circulatory system: Secondary | ICD-10-CM

## 2023-05-25 DIAGNOSIS — R54 Age-related physical debility: Secondary | ICD-10-CM | POA: Diagnosis present

## 2023-05-25 DIAGNOSIS — R001 Bradycardia, unspecified: Secondary | ICD-10-CM | POA: Diagnosis not present

## 2023-05-25 DIAGNOSIS — L7632 Postprocedural hematoma of skin and subcutaneous tissue following other procedure: Secondary | ICD-10-CM | POA: Diagnosis not present

## 2023-05-25 DIAGNOSIS — K761 Chronic passive congestion of liver: Secondary | ICD-10-CM | POA: Diagnosis not present

## 2023-05-25 DIAGNOSIS — I4819 Other persistent atrial fibrillation: Secondary | ICD-10-CM | POA: Diagnosis present

## 2023-05-25 DIAGNOSIS — G4733 Obstructive sleep apnea (adult) (pediatric): Secondary | ICD-10-CM | POA: Diagnosis present

## 2023-05-25 DIAGNOSIS — Z789 Other specified health status: Secondary | ICD-10-CM | POA: Diagnosis not present

## 2023-05-25 DIAGNOSIS — Z9981 Dependence on supplemental oxygen: Secondary | ICD-10-CM

## 2023-05-25 DIAGNOSIS — H902 Conductive hearing loss, unspecified: Secondary | ICD-10-CM | POA: Diagnosis present

## 2023-05-25 DIAGNOSIS — R0602 Shortness of breath: Secondary | ICD-10-CM | POA: Diagnosis not present

## 2023-05-25 DIAGNOSIS — Z952 Presence of prosthetic heart valve: Secondary | ICD-10-CM

## 2023-05-25 DIAGNOSIS — Y712 Prosthetic and other implants, materials and accessory cardiovascular devices associated with adverse incidents: Secondary | ICD-10-CM | POA: Diagnosis present

## 2023-05-25 DIAGNOSIS — R6889 Other general symptoms and signs: Secondary | ICD-10-CM | POA: Diagnosis not present

## 2023-05-25 DIAGNOSIS — Z8661 Personal history of infections of the central nervous system: Secondary | ICD-10-CM

## 2023-05-25 DIAGNOSIS — Z95810 Presence of automatic (implantable) cardiac defibrillator: Secondary | ICD-10-CM

## 2023-05-25 DIAGNOSIS — Z79899 Other long term (current) drug therapy: Secondary | ICD-10-CM

## 2023-05-25 DIAGNOSIS — I517 Cardiomegaly: Secondary | ICD-10-CM | POA: Diagnosis not present

## 2023-05-25 DIAGNOSIS — R579 Shock, unspecified: Secondary | ICD-10-CM | POA: Diagnosis not present

## 2023-05-25 DIAGNOSIS — E875 Hyperkalemia: Secondary | ICD-10-CM | POA: Diagnosis present

## 2023-05-25 DIAGNOSIS — R531 Weakness: Secondary | ICD-10-CM | POA: Diagnosis not present

## 2023-05-25 DIAGNOSIS — I4719 Other supraventricular tachycardia: Secondary | ICD-10-CM | POA: Diagnosis present

## 2023-05-25 LAB — CBC WITH DIFFERENTIAL/PLATELET
Abs Immature Granulocytes: 0.18 10*3/uL — ABNORMAL HIGH (ref 0.00–0.07)
Basophils Absolute: 0.1 10*3/uL (ref 0.0–0.1)
Basophils Relative: 0 %
Eosinophils Absolute: 0 10*3/uL (ref 0.0–0.5)
Eosinophils Relative: 0 %
HCT: 34.1 % — ABNORMAL LOW (ref 39.0–52.0)
Hemoglobin: 10 g/dL — ABNORMAL LOW (ref 13.0–17.0)
Immature Granulocytes: 2 %
Lymphocytes Relative: 3 %
Lymphs Abs: 0.3 10*3/uL — ABNORMAL LOW (ref 0.7–4.0)
MCH: 28.6 pg (ref 26.0–34.0)
MCHC: 29.3 g/dL — ABNORMAL LOW (ref 30.0–36.0)
MCV: 97.4 fL (ref 80.0–100.0)
Monocytes Absolute: 0.9 10*3/uL (ref 0.1–1.0)
Monocytes Relative: 7 %
Neutro Abs: 10.9 10*3/uL — ABNORMAL HIGH (ref 1.7–7.7)
Neutrophils Relative %: 88 %
Platelets: 216 10*3/uL (ref 150–400)
RBC: 3.5 MIL/uL — ABNORMAL LOW (ref 4.22–5.81)
RDW: 17.5 % — ABNORMAL HIGH (ref 11.5–15.5)
WBC: 12.4 10*3/uL — ABNORMAL HIGH (ref 4.0–10.5)
nRBC: 0.2 % (ref 0.0–0.2)

## 2023-05-25 LAB — COMPREHENSIVE METABOLIC PANEL
ALT: 82 U/L — ABNORMAL HIGH (ref 0–44)
ALT: 115 U/L — ABNORMAL HIGH (ref 0–44)
AST: 121 U/L — ABNORMAL HIGH (ref 15–41)
AST: 169 U/L — ABNORMAL HIGH (ref 15–41)
Albumin: 3 g/dL — ABNORMAL LOW (ref 3.5–5.0)
Albumin: 2.8 g/dL — ABNORMAL LOW (ref 3.5–5.0)
Alkaline Phosphatase: 178 U/L — ABNORMAL HIGH (ref 38–126)
Alkaline Phosphatase: 168 U/L — ABNORMAL HIGH (ref 38–126)
Anion gap: 16 — ABNORMAL HIGH (ref 5–15)
Anion gap: 15 (ref 5–15)
BUN: 51 mg/dL — ABNORMAL HIGH (ref 8–23)
BUN: 53 mg/dL — ABNORMAL HIGH (ref 8–23)
CO2: 21 mmol/L — ABNORMAL LOW (ref 22–32)
CO2: 23 mmol/L (ref 22–32)
Calcium: 9 mg/dL (ref 8.9–10.3)
Calcium: 9.2 mg/dL (ref 8.9–10.3)
Chloride: 100 mmol/L (ref 98–111)
Chloride: 100 mmol/L (ref 98–111)
Creatinine, Ser: 4.12 mg/dL — ABNORMAL HIGH (ref 0.61–1.24)
Creatinine, Ser: 4.37 mg/dL — ABNORMAL HIGH (ref 0.61–1.24)
GFR, Estimated: 14 mL/min — ABNORMAL LOW (ref >60)
GFR, Estimated: 13 mL/min — ABNORMAL LOW (ref >60)
Glucose, Bld: 156 mg/dL — ABNORMAL HIGH (ref 70–99)
Glucose, Bld: 100 mg/dL — ABNORMAL HIGH (ref 70–99)
Potassium: 7 mmol/L (ref 3.5–5.1)
Potassium: 6.8 mmol/L (ref 3.5–5.1)
Sodium: 137 mmol/L (ref 135–145)
Sodium: 138 mmol/L (ref 135–145)
Total Bilirubin: 1.6 mg/dL — ABNORMAL HIGH (ref 0.3–1.2)
Total Bilirubin: 1.9 mg/dL — ABNORMAL HIGH (ref 0.3–1.2)
Total Protein: 6.4 g/dL — ABNORMAL LOW (ref 6.5–8.1)
Total Protein: 5.9 g/dL — ABNORMAL LOW (ref 6.5–8.1)

## 2023-05-25 LAB — I-STAT CHEM 8, ED
BUN: 57 mg/dL — ABNORMAL HIGH (ref 8–23)
Calcium, Ion: 0.94 mmol/L — ABNORMAL LOW (ref 1.15–1.40)
Chloride: 103 mmol/L (ref 98–111)
Creatinine, Ser: 4.3 mg/dL — ABNORMAL HIGH (ref 0.61–1.24)
Glucose, Bld: 154 mg/dL — ABNORMAL HIGH (ref 70–99)
HCT: 35 % — ABNORMAL LOW (ref 39.0–52.0)
Hemoglobin: 11.9 g/dL — ABNORMAL LOW (ref 13.0–17.0)
Potassium: 6.7 mmol/L (ref 3.5–5.1)
Sodium: 133 mmol/L — ABNORMAL LOW (ref 135–145)
TCO2: 20 mmol/L — ABNORMAL LOW (ref 22–32)

## 2023-05-25 LAB — TROPONIN I (HIGH SENSITIVITY)
Troponin I (High Sensitivity): 72 ng/L — ABNORMAL HIGH (ref <18)
Troponin I (High Sensitivity): 61 ng/L — ABNORMAL HIGH (ref <18)

## 2023-05-25 LAB — RESP PANEL BY RT-PCR (RSV, FLU A&B, COVID)  RVPGX2
Influenza A by PCR: NEGATIVE
Influenza B by PCR: NEGATIVE
Resp Syncytial Virus by PCR: NEGATIVE
SARS Coronavirus 2 by RT PCR: NEGATIVE

## 2023-05-25 LAB — BRAIN NATRIURETIC PEPTIDE: B Natriuretic Peptide: 1971.8 pg/mL — ABNORMAL HIGH (ref 0.0–100.0)

## 2023-05-25 LAB — PROTIME-INR
INR: 2.8 — ABNORMAL HIGH (ref 0.8–1.2)
Prothrombin Time: 29.6 s — ABNORMAL HIGH (ref 11.4–15.2)

## 2023-05-25 LAB — MRSA NEXT GEN BY PCR, NASAL: MRSA by PCR Next Gen: NOT DETECTED

## 2023-05-25 LAB — I-STAT CG4 LACTIC ACID, ED
Lactic Acid, Venous: 4.5 mmol/L (ref 0.5–1.9)
Lactic Acid, Venous: 4.3 mmol/L (ref 0.5–1.9)

## 2023-05-25 LAB — LIPASE, BLOOD: Lipase: 23 U/L (ref 11–51)

## 2023-05-25 MED ORDER — SODIUM CHLORIDE 0.9 % IV SOLN: 250.0000 mL | INTRAVENOUS | Status: DC

## 2023-05-25 MED ORDER — SODIUM ZIRCONIUM CYCLOSILICATE 10 G PO PACK
10.0000 g | PACK | Freq: Once | ORAL | Status: AC
  Administered 2023-05-25: 10 g via ORAL
  Filled 2023-05-25: qty 1

## 2023-05-25 MED ORDER — WARFARIN - PHARMACIST DOSING INPATIENT: Freq: Every day | Status: DC

## 2023-05-25 MED ORDER — SODIUM CHLORIDE 0.9% FLUSH
3.0000 mL | Freq: Two times a day (BID) | INTRAVENOUS | Status: DC
  Administered 2023-05-25: 3 mL via INTRAVENOUS

## 2023-05-25 MED ORDER — WARFARIN SODIUM 2.5 MG PO TABS
2.5000 mg | ORAL_TABLET | Freq: Once | ORAL | Status: DC
  Filled 2023-05-25: qty 1

## 2023-05-25 MED ORDER — AMIODARONE HCL 200 MG PO TABS: 400.0000 mg | ORAL_TABLET | Freq: Every day | ORAL | Status: DC

## 2023-05-25 MED ORDER — MORPHINE SULFATE (PF) 2 MG/ML IV SOLN: 2.0000 mg | INTRAVENOUS | Status: DC | PRN

## 2023-05-25 MED ORDER — SODIUM BICARBONATE 8.4 % IV SOLN
50.0000 meq | Freq: Once | INTRAVENOUS | Status: AC
  Administered 2023-05-25: 50 meq via INTRAVENOUS

## 2023-05-25 MED ORDER — SODIUM CHLORIDE 0.9% FLUSH: 3.0000 mL | INTRAVENOUS | Status: DC | PRN

## 2023-05-25 MED ORDER — CALCIUM GLUCONATE 10 % IV SOLN
1.0000 g | Freq: Once | INTRAVENOUS | Status: DC
  Administered 2023-05-25: 1 g via INTRAVENOUS
  Filled 2023-05-25: qty 10

## 2023-05-25 MED ORDER — SODIUM CHLORIDE 0.9 % IV SOLN
250.0000 mL | INTRAVENOUS | Status: DC | PRN
Start: 2023-05-25 — End: 2023-05-26

## 2023-05-25 MED ORDER — LEVOTHYROXINE SODIUM 100 MCG PO TABS
100.0000 ug | ORAL_TABLET | Freq: Every day | ORAL | Status: DC
Start: 2023-05-26 — End: 2023-05-26

## 2023-05-25 MED ORDER — DEXTROSE 50 % IV SOLN
1.0000 | Freq: Once | INTRAVENOUS | Status: AC
  Administered 2023-05-25: 50 mL via INTRAVENOUS
  Filled 2023-05-25: qty 50

## 2023-05-25 MED ORDER — CALCIUM GLUCONATE-NACL 2-0.675 GM/100ML-% IV SOLN
2.0000 g | Freq: Once | INTRAVENOUS | Status: AC
  Administered 2023-05-25: 2000 mg via INTRAVENOUS
  Filled 2023-05-25: qty 100

## 2023-05-25 MED ORDER — MIDODRINE HCL 5 MG PO TABS: 5.0000 mg | ORAL_TABLET | Freq: Three times a day (TID) | ORAL | Status: DC

## 2023-05-25 MED ORDER — INSULIN ASPART 100 UNIT/ML IV SOLN
10.0000 [IU] | Freq: Once | INTRAVENOUS | Status: AC
  Administered 2023-05-25: 10 [IU] via INTRAVENOUS

## 2023-05-25 MED ORDER — ACETAMINOPHEN 325 MG PO TABS: 650.0000 mg | ORAL_TABLET | ORAL | Status: DC | PRN

## 2023-05-25 MED ORDER — NOREPINEPHRINE 4 MG/250ML-% IV SOLN
10.0000 ug/min | INTRAVENOUS | Status: DC
  Administered 2023-05-25: 10 ug/min via INTRAVENOUS
  Administered 2023-05-25: 5 ug/min via INTRAVENOUS
  Filled 2023-05-25: qty 250

## 2023-05-25 MED ORDER — INSULIN ASPART 100 UNIT/ML IV SOLN
5.0000 [IU] | Freq: Once | INTRAVENOUS | Status: AC
  Administered 2023-05-25: 5 [IU] via INTRAVENOUS

## 2023-05-25 MED ORDER — QUINIDINE GLUCONATE ER 324 MG PO TBCR: 324.0000 mg | EXTENDED_RELEASE_TABLET | Freq: Two times a day (BID) | ORAL | Status: DC

## 2023-05-25 NOTE — H&P (Addendum)
Advanced Heart Failure Team History and Physical Note   PCP:  Laurey Morale, MD  PCP-Cardiology: None     Reason for Admission: Cardiogenic shock   HPI:   Austin [name] is an 82 y.o. 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  He had a VT ablation in 2017 and had a Medtronic CRT-D device implanted. This had to be removed due to infection in 11/12 and was reimplanted in 1/13. He has been in atrial fibrillation persistently since late 2021.  His LV lead is not functional.  He has complete heart block and is persistently RV pacing.     S/p VT ablation with Dr. Marquette Saa 1/23.    Echo 3/24 showed EF 20-25%, mildly decreased RV systolic function, moderate TR, IVC dilated, stable mechanical mitral valve with mean gradient 4 mmHg.    Barosimtulator device implant 8/24 c/b chest wall hematoma, s/p I&D w/ placement of wound vac.    Readmitted on 04/06/23 for ICD shocks for VT and a/c CHF w/ volume overload. He was placed on IV amio. Rates improved but was in and out of slow VT and paced out of VT 9/13 by EP. Transitioned back to PO amiodarone 400 mg bid and quinidine. Diuresis was complicated by worsening renal fx/ AKI. He required inotropic support w/ milrinone. Once stablilized, he underwent barostimulator device removal by [facility] on 9/17. Wound cx grew Pseudomonas. ID consulted and recommended 10 day course of Ciprofloxacin. JP drain kept in until his follow up with Vascular Surgery. He was discharged to [facility] on 04/21/23.    After discharge, the drain was removed. He developed reaccumulation of fluid in left chest wall and was readmitted on 10/04. Had another drain placed by IR.    He was readmitted in 10/24 with acute on chronic CHF. Creatinine up to 4.14. He was given IV Lasix and started on midodrine.   He presented  to ED this am with weakness and dyspnea for several days. He was hypotensive with SBP in 70s. Initial labs with Cr 4.30, NA 133, K 6.7, lactic acid 4.5, ionized calcium 0.94. ECG with sine waves. Advanced Heart Failure asked to see due to concern for cardiogenic shock.  Home Medications Prior to Admission medications   Medication Sig Start Date End Date Taking? Authorizing Provider  acetaminophen (TYLENOL) 500 MG tablet Take 1,000 mg by mouth every 8 (eight) hours as needed for headache, fever or mild pain.    [provider]  amiodarone (PACERONE) 400 MG tablet Take 400 mg by mouth daily.    [provider]  levothyroxine (SYNTHROID) 100 MCG tablet Take 1 tablet (100 mcg total) by mouth daily. 05/19/23   Venita Sheffield, MD  melatonin 5 MG TABS Take 5 mg by mouth at bedtime.    [provider]  midodrine (PROAMATINE) 5 MG tablet Take 1 tablet (5 mg total) by mouth 3 (three) times daily with meals. 05/17/23 06/16/23  Arrien, York Ram, MD  mirtazapine (REMERON) 7.5 MG tablet Take 1 tablet (7.5 mg total) by mouth at bedtime. 04/25/23   Venita Sheffield, MD  Multiple Vitamin (MULTIVITAMIN WITH MINERALS) TABS tablet Take 1 tablet by mouth in the morning.    [provider]  mupirocin ointment (BACTROBAN) 2 % Apply 1 Application topically daily. 03/01/23   McDonald, Rachelle Hora, DPM  nitroGLYCERIN (NITROSTAT) 0.4 MG SL tablet Place  1 tablet (0.4 mg total) under the tongue every 5 (five) minutes x 3 doses as needed for chest pain. 10/14/18   Abelino Derrick, PA-C  potassium chloride SA (KLOR-CON M) 20 MEQ tablet Take 2 tablets (40 mEq total) by mouth every morning AND 1 tablet (20 mEq total) every evening. 05/19/23   Laurey Morale, MD  quiniDINE gluconate 324 MG CR tablet TAKE 1 TABLET BY MOUTH 2 TIMES DAILY. 01/25/23   Laurey Morale, MD  Torsemide 40 MG TABS Take 80 mg by mouth every morning AND 40 mg every evening. 05/19/23   Laurey Morale, MD  warfarin  (COUMADIN) 2.5 MG tablet Take 1-2 tablets Daily or as prescribed by [facility]mad[facility]. Patient not taking: Reported on 05/19/2023 05/06/23   Laurey Morale, MD    Past Medical History: Past Medical History:  Diagnosis Date   AICD (automatic cardioverter/defibrillator) present    Atrial fibrillation [facility])    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    cx by infection, explantation11/12 & reimplant 1/13   CHF (congestive heart failure) (HCC)    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   Conductive hearing loss    uses hearing aids   Dysrhythmia    A-fib   History of blood transfusion 03/24/2023   Hypothyroidism    Intraspinal abscess    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   Nonischemic cardiomyopathy (HCC)    Psychosexual dysfunction with inhibited sexual excitement    S/P mitral valve replacement    Syncope and collapse    Unspecified sleep apnea    last sleep study 11/07, uses cpap   Ventricular tachycardia (HCC)    VT (ventricular tachycardia) (HCC)     Past Surgical History: Past Surgical History:  Procedure Laterality Date   BIV ICD GENERATOR CHANGEOUT N/A 05/04/2017   Procedure: BiV ICD Generator Changeout;  Surgeon: Marinus Maw, MD;  Location[facility] INVASIVE CV LAB;  Service: Cardiovascular;  Laterality: N/A;   CARDIAC CATHETERIZATION  04/24/2002   CARDIAC VALVE REPLACEMENT     CARDIOVERSION  03/09/2012   Procedure: CARDIOVERSION;  Surgeon: Marinus Maw, MD;  Location: [facility] ENDOSCOPY;  Service: Cardiovascular;  Laterality: N/A;   CARDIOVERSION N/A 11/22/2013   Procedure: CARDIOVERSION;  Surgeon: Thurmon Fair, MD;  Location: [facility] ENDOSCOPY;  Service: Cardiovascular;  Laterality: N/A;   dental implants     ELECTROPHYSIOLOGIC STUDY N/A 09/15/2015   Procedure: V Tach Ablation;  Surgeon: Marinus Maw, MD;  Location: [facility] INVASIVE CV LAB;  Service: Cardiovascular;  Laterality: N/A;   Evacution of epidural  lumbar epidural abscess  07/26/1997   INCISION AND DRAINAGE OF WOUND Left 03/25/2023   Procedure: Washout of Chest Wall Hematoma;  Surgeon: Nada Libman, MD;  Location: [facility] OR;  Service: Vascular;  Laterality: Left;   INCISION AND DRAINAGE OF WOUND N/A 03/29/2023   Procedure: IRRIGATION AND DEBRIDEMENT  CHEST WALL;  Surgeon: Nada Libman, MD;  Location: [facility] OR;  Service: Vascular;  Laterality: N/A;   INSERT / REPLACE / REMOVE PACEMAKER  11/23/2008   IR US GUIDE BX ASP/DRAIN  04/30/2023   MITRAL VALVE REPLACEMENT     w #[id number] [facility]   PERMANENT PACEMAKER INSERTION N/A 08/05/2011   Procedure: PERMANENT PACEMAKER INSERTION;  Surgeon: Marinus Maw, MD;  Location: [facility] CATH LAB;  Service: Cardiovascular;  Laterality: N/A;   REMOVAL OF BAROSTIM N/A 04/12/2023   Procedure: REMOVAL OF [name];  Surgeon: Nada Libman, MD;  Location: [facility] OR;  Service: Vascular;  Laterality: N/A;   RIGHT HEART CATH N/A 02/26/2021   Procedure: RIGHT HEART CATH;  Surgeon: Laurey Morale, MD;  Location: [facility] INVASIVE CV LAB;  Service: Cardiovascular;  Laterality: N/A;   THYROIDECTOMY     TONSILLECTOMY     V-TACH ABLATION  07/30/2021   in [place]   VALVE REPLACEMENT  07/26/1998    Family History:  Family History  Problem Relation Age of Onset   Heart disease Mother    Heart failure Mother    Heart disease Father    Heart failure Father    Sleep apnea Neg Hx     Social History: Social History   Socioeconomic History   Marital status: Married    Spouse name: Not on file   Number of children: 1   Years of education: 18   Highest education level: Master's degree (e.g., MA, MS, MEng, MEd, MSW, MBA)  Occupational History   Occupation: retired from [employer]  Tobacco Use   Smoking status: Never   Smokeless tobacco: Never  Vaping Use   Vaping status: Never Used  Substance and Sexual Activity   Alcohol use: Not Currently    Comment: occasionally   Drug use: No   Sexual activity: Not Currently   Other Topics Concern   Not on file  Social History Narrative   Lives with wife in a one story home.  Has one daughter.  Retired.  Education: Masters.   Social Determinants of Health   Financial Resource Strain: Low Risk  (03/08/2023)   Overall Financial Resource Strain (CARDIA)    Difficulty of Paying Living Expenses: Not hard at all  Food Insecurity: No Food Insecurity (05/25/2023)   Hunger Vital Sign    Worried About Running Out of Food in the Last Year: Never true    Ran Out of Food in the Last Year: Never true  Transportation Needs: No Transportation Needs (05/25/2023)   PRAPARE - Administrator, Civil Service (Medical): No    Lack of Transportation (Non-Medical): No  Physical Activity: Insufficiently Active (03/08/2023)   Exercise Vital Sign    Days of Exercise per Week: 4 days    Minutes of Exercise per Session: 30 min  Stress: No Stress Concern Present (03/08/2023)   [employer] of Occupational Health - Occupational Stress Questionnaire    Feeling of Stress : Not at all  Social Connections: Moderately Integrated (03/08/2023)   Social Connection and Isolation Panel [NHANES]    Frequency of Communication with Friends and Family: More than three times a week    Frequency of Social Gatherings with Friends and Family: Twice a week    Attends Religious Services: More than 4 times per year    Active Member of Clubs or Organizations: No    Attends Banker Meetings: Never    Marital Status: Married    Allergies:  No Known Allergies  Objective:    Vital Signs:   Temp:  [97.4 F (36.3 C)] 97.4 F (36.3 C) (10/30 1233) Pulse Rate:  [59-107] 88 (10/30 1251) Resp:  [0-30] 30 (10/30 1251) BP: (67-122)/(44-73) 111/73 (10/30 1251) SpO2:  [89 %-98 %] 97 % (10/30 1251) Weight:  [87.6 kg] 87.6 kg (10/30 1139)   There were no vitals filed for this visit.   Physical Exam     General:  Critically ill appearing HEENT: Normal Neck: Supple. JVP to  jaw.  Cor: PMI nondisplaced.  Regular rate & rhythm. No rubs, gallops or murmurs. Lungs: Tachypneic. Coarse throughout Abdomen: Soft, nontender, nondistended.  Extremities: No cyanosis, clubbing, rash, 2+ edema, legs are cool Neuro: Lethargic   Telemetry   Sine waves  EKG   Sine wave pattern suggestive of severe hyperkalemia  Labs     Basic Metabolic Panel: Recent Labs  Lab 05/25/23 1215 05/25/23 1233  NA 137 133*  K 7.0* 6.7*  CL 100 103  CO2 21*  --   GLUCOSE 156* 154*  BUN 51* 57*  CREATININE 4.12* 4.30*  CALCIUM 9.0  --     Liver Function Tests: Recent Labs  Lab 05/25/23 1215  AST 121*  ALT 82*  ALKPHOS 178*  BILITOT 1.6*  PROT 6.4*  ALBUMIN 3.0*   Recent Labs  Lab 05/25/23 1215  LIPASE 23   No results for input(s): "AMMONIA" in the last 168 hours.  CBC: Recent Labs  Lab 05/25/23 1215 05/25/23 1233  WBC 12.4*  --   NEUTROABS 10.9*  --   HGB 10.0* 11.9*  HCT 34.1* 35.0*  MCV 97.4  --   PLT 216  --     Cardiac Enzymes: No results for input(s): "CKTOTAL", "CKMB", "CKMBINDEX", "TROPONINI" in the last 168 hours.  BNP: BNP (last 3 results) Recent Labs    04/29/23 1542 05/04/23 1712 05/11/23 1832  BNP 1,194.1* 1,064.3* 1,224.2*    ProBNP (last 3 results) Recent Labs    12/16/22 0959  PROBNP 3,692*     CBG: No results for input(s): "GLUCAP" in the last 168 hours.  Coagulation Studies: Recent Labs    05/25/23 1215  LABPROT 29.6*  INR 2.8*    Imaging: No results found.   Patient Profile   82 y.o. 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  Presenting acutely ill in Cardiogenic shock.  Assessment/Plan   Cardiogenic shock -With longstanding NICM/chronic sysotlic CHF.  -Has medtronic CRT-D with nonfunctional LV lead -Echo 9/24 with EF 20%, mechanical MV mean gradient 8 mmHg.  Barostimulator  placed but had to be removed due to chest wall hemorrhage  -He is not a candidate for advanced therapies. We are out of options to treat his heart failure.  -Lactic acid 4.5.  -Start NE peripherally at 10 mcg/min. Admit to ICU for management -He is miserable but makes it clear that he would not want CPR or mechanical ventilation. His chart indicates full code status but he expresses he was previously made a DNR and wants this order to remain. Code status changed to DNR/DNI in chart. Will discuss turning off ICD function on his device. -Will arrange urgent Palliative Care consult.  2. VT -Hx of VT ablation in 2017 and redo ablation in 2023. He has been on amiodarone 400 mg daily and quinidine -Multiple therapies from his ICD for VT in the past. Paced out of VT by EP in 09/24.  3. Hyperkalemia - K 6.7 on iSTAT w/ sine waves on ECG - Give 2g calcium gluconate, amp of bicarb, insulin and start dextrose  4. Atrial fibrillation/flutter, atrial tachycardia: -Paroxysmal -Continue amiodarone -On warfarin at home. Start heparin gtt  5. Complete heart block:  - Patient is pacer dependent due to CHB.   6. OSA:  -Severe. Uses CPAP  7. Mechanical mitral valve:  -Mean gradient higher on echo 9/24 at 8 mmHg.  - On warfarin INR goal 2.5-3.5.   8. Chest wall  hematoma:  post barostimulator complication, in setting of coumadin for mechanical MV.  s/p I&D for evacuation of hematoma and placement of wound vac + placement of antibiotic beads on 03/25/23. Barostimulator device removed 04/12/23. Wound cx with Pseudomonas.  - Follow with vascular re: drain.   9. CKD stage IV:  -Creatinine baseline had been 1.5-2.  Recent AKIs, most recent creatinine in 3s. Now Cr > 4 in setting of shock. - Not candidate for HD given age and  end-stage HF.   10. Elevated LFTs:  -Due to CHF/volume overload with passive hepatic congestion     Pammy Vesey N, PA-C 05/25/2023, 2:24 PM  Advanced Heart Failure  Team Pager [phone] (M-F; 7a - 5p)  Please contact [facility] for night-coverage after hours (4p -7a ) and weekends on [email or web address]

## 2023-05-25 NOTE — Consult Note (Signed)
NAME:  Austin [name], MRN:  [id number], DOB:  29-Jun-1941, LOS: 0 ADMISSION DATE:  2023/06/15, CONSULTATION DATE:  06/15/23 REFERRING MD:  Dr. Gasper Lloyd , CHIEF COMPLAINT: Cardiogenic shock  History of Present Illness:  Austin [name] is a 82 y.o. 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  Patient was admitted per heart failure service for cardiogenic shock requiring vasopressor support.  Palliative care was consulted and decision was made to continue aggressive interventions but remain DNR   On arrival to ICU patient was seen significantly hypotensive and slightly sleepy but arousable.  Confirmed with patient and family that patient is a DNR/DNI and does not wish to escalate care including placement of central line for escalation of vasopressor support.  At this time we will continue current interventions with max Levophed at 10 mcg with no up titration to allow family time to present to hospital to visit.  Plan is to meet with palliative care tomorrow but if patient declines further overnight proceed with comfort interventions.  Pertinent  Medical History   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   Conductive hearing loss    uses hearing aids   Dysrhythmia    A-fib   History of blood transfusion 03/24/2023   Hypothyroidism    Intraspinal abscess    Mitral valve  insufficiency and aortic valve insufficiency    s/p MVR mechanical   Nonischemic cardiomyopathy (HCC)    Psychosexual dysfunction with inhibited sexual excitement    S/P mitral valve replacement    Syncope and collapse    Unspecified sleep apnea    last sleep study 11/07, uses cpap   Ventricular tachycardia (HCC)    VT (ventricular tachycardia) (HCC)     Significant Hospital Events: Including procedures, antibiotic start and stop dates in addition to other pertinent events   2023-06-15 admitted with cardiogenic shock   Interim History / Subjective:  As above   Objective   Blood pressure (!) 84/58, pulse 86, temperature (!) 97.3 F (36.3 C), temperature source Axillary, resp. rate 10, weight 87 kg, SpO2 98%.       No intake or output data in the 24 hours ending 06-15-2023 1727 Filed Weights   15-Jun-2023 1700  Weight: 87 kg    Examination: General: Acute on chronically ill appearing elderly male, lying in bed in NAD HEENT: [place]/AT, MM pink/moist, PERRL,  Neuro: Alert and oriented x3, sleepy CV: s1s2 regular rate and rhythm, no murmur, rubs, or gallops,  PULM:  Mild increased work of breathing, with accessory muscle use  GI: soft, bowel sounds active in all 4 quadrants, non-tender, non-distended Extremities: warm/dry, generilized edema  Skin: no rashes or lesions  Resolved Hospital Problem list     Assessment & Plan:  Cardiogenic shock  VT Hyperkalemia Atrial fibrillation/flutter, atrial tachycardia Complete heart block  OSA Mechanical mitral valve  Chest wall hematoma CKD stage IV Elevated LFTs Plan Continue current plan of  care including vasopressors support via peripheral access. No escalation in care including no central access. Daughter in in route to hospital and family would like her to visit prior to transition to comfort care but understand this may not happen given critical illness. PRN Morphine ordered for pain and/or dyspnea   Best Practice (right click and  "Reselect all SmartList Selections" daily)  Per primary   Labs   CBC: Recent Labs  Lab Jun 12, 2023 1215 06/12/23 1233  WBC 12.4*  --   NEUTROABS 10.9*  --   HGB 10.0* 11.9*  HCT 34.1* 35.0*  MCV 97.4  --   PLT 216  --     Basic Metabolic Panel: Recent Labs  Lab 2023/06/12 1215 06/12/23 1233 06/12/2023 1604  NA 137 133* 138  K 7.0* 6.7* 6.8*  CL 100 103 100  CO2 21*  --  23  GLUCOSE 156* 154* 100*  BUN 51* 57* 53*  CREATININE 4.12* 4.30* 4.37*  CALCIUM 9.0  --  9.2   GFR: Estimated Creatinine Clearance: 15.2 mL/min (A) (by C-G formula based on SCr of 4.37 mg/dL (H)). Recent Labs  Lab June 12, 2023 1215 June 12, 2023 1233 06/12/2023 1624  WBC 12.4*  --   --   LATICACIDVEN  --  4.5* 4.3*    Liver Function Tests: Recent Labs  Lab 2023-06-12 1215 12-Jun-2023 1604  AST 121* 169*  ALT 82* 115*  ALKPHOS 178* 168*  BILITOT 1.6* 1.9*  PROT 6.4* 5.9*  ALBUMIN 3.0* 2.8*   Recent Labs  Lab 06-12-2023 1215  LIPASE 23   No results for input(s): "AMMONIA" in the last 168 hours.  ABG    Component Value Date/Time   PHART 7.369 02/26/2021 1338   PHART 7.372 02/26/2021 1338   PCO2ART 43.4 02/26/2021 1338   PCO2ART 43.4 02/26/2021 1338   PO2ART 38 (LL) 02/26/2021 1338   PO2ART 38 (LL) 02/26/2021 1338   HCO3 25.1 02/26/2021 1338   HCO3 25.2 02/26/2021 1338   TCO2 20 (L) 06-12-23 1233   ACIDBASEDEF 1.0 02/26/2021 1338   O2SAT 72.1 04/22/2023 0540     Coagulation Profile: Recent Labs  Lab 2023/06/12 1215  INR 2.8*    Cardiac Enzymes: No results for input(s): "CKTOTAL", "CKMB", "CKMBINDEX", "TROPONINI" in the last 168 hours.  HbA1C: No results found for: "HGBA1C"  CBG: No results for input(s): "GLUCAP" in the last 168 hours.  Review of Systems:   Please see the history of present illness. All other systems reviewed and are negative   Past Medical History:  He,  has a past medical history of AICD (automatic cardioverter/defibrillator) present, Atrial fibrillation (HCC),  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, CHF (congestive heart failure) (HCC), Chronic kidney disease (09/2022), Conductive hearing loss, Dysrhythmia, History of blood transfusion (03/24/2023), Hypothyroidism, Intraspinal abscess, Mitral valve insufficiency and aortic valve insufficiency, Nonischemic cardiomyopathy (HCC), Psychosexual dysfunction with inhibited sexual excitement, S/P mitral valve replacement, Syncope and collapse, Unspecified sleep apnea, Ventricular tachycardia (HCC), and VT (ventricular tachycardia) (HCC).   Surgical History:   Past Surgical History:  Procedure Laterality Date   BIV ICD GENERATOR CHANGEOUT N/A 05/04/2017   Procedure: BiV ICD Generator Changeout;  Surgeon: Marinus Maw, MD;  Location[facility] INVASIVE CV LAB;  Service: Cardiovascular;  Laterality: N/A;   CARDIAC CATHETERIZATION  04/24/2002   CARDIAC VALVE REPLACEMENT     CARDIOVERSION  03/09/2012   Procedure: CARDIOVERSION;  Surgeon: Marinus Maw, MD;  Location: [facility] ENDOSCOPY;  Service: Cardiovascular;  Laterality: N/A;   CARDIOVERSION N/A 11/22/2013  Procedure: CARDIOVERSION;  Surgeon: Thurmon Fair, MD;  Location: [facility] ENDOSCOPY;  Service: Cardiovascular;  Laterality: N/A;   dental implants     ELECTROPHYSIOLOGIC STUDY N/A 09/15/2015   Procedure: V Tach Ablation;  Surgeon: Marinus Maw, MD;  Location: [facility] INVASIVE CV LAB;  Service: Cardiovascular;  Laterality: N/A;   Evacution of epidural lumbar epidural abscess  07/26/1997   INCISION AND DRAINAGE OF WOUND Left 03/25/2023   Procedure: Washout of Chest Wall Hematoma;  Surgeon: Nada Libman, MD;  [facility] OR;  Service: Vascular;  Laterality: Left;   INCISION AND DRAINAGE OF WOUND N/A 03/29/2023   Procedure: IRRIGATION AND DEBRIDEMENT  CHEST WALL;  Surgeon: Nada Libman, MD;  Location: [facility] OR;  Service: Vascular;  Laterality: N/A;   INSERT / REPLACE / REMOVE PACEMAKER  11/23/2008   IR US GUIDE BX ASP/DRAIN  04/30/2023   MITRAL  VALVE REPLACEMENT     w #[id number] [facility]   PERMANENT PACEMAKER INSERTION N/A 08/05/2011   Procedure: PERMANENT PACEMAKER INSERTION;  Surgeon: Marinus Maw, MD;  Location: [facility] CATH LAB;  Service: Cardiovascular;  Laterality: N/A;   REMOVAL OF BAROSTIM N/A 04/12/2023   Procedure: REMOVAL OF [name];  Surgeon: Nada Libman, MD;  Location: [facility] OR;  Service: Vascular;  Laterality: N/A;   RIGHT HEART CATH N/A 02/26/2021   Procedure: RIGHT HEART CATH;  Surgeon: Laurey Morale, MD;  Location: [facility] INVASIVE CV LAB;  Service: Cardiovascular;  Laterality: N/A;   THYROIDECTOMY     TONSILLECTOMY     V-TACH ABLATION  07/30/2021   in [place]   VALVE REPLACEMENT  07/26/1998     Social History:   reports that he has never smoked. He has never used smokeless tobacco. He reports that he does not currently use alcohol. He reports that he does not use drugs.   Family History:  His family history includes Heart disease in his father and mother; Heart failure in his father and mother. There is no history of Sleep apnea.   Allergies No Known Allergies   Home Medications  Prior to Admission medications   Medication Sig Start Date End Date Taking? Authorizing Provider  acetaminophen (TYLENOL) 500 MG tablet Take 1,000 mg by mouth every 8 (eight) hours as needed for headache, fever or mild pain.   Yes [provider]  amiodarone (PACERONE) 400 MG tablet Take 400 mg by mouth daily.   Yes [provider]  levothyroxine (SYNTHROID) 100 MCG tablet Take 1 tablet (100 mcg total) by mouth daily. 05/19/23  Yes Veludandi, Elvis Coil, MD  melatonin 5 MG TABS Take 5 mg by mouth at bedtime.   Yes [provider]  midodrine (PROAMATINE) 5 MG tablet Take 1 tablet (5 mg total) by mouth 3 (three) times daily with meals. 05/17/23 06/16/23 Yes Arrien, York Ram, MD  mirtazapine (REMERON) 7.5 MG tablet Take 1 tablet (7.5 mg total) by mouth at bedtime. 04/25/23  Yes Venita Sheffield, MD  Multiple  Vitamin (MULTIVITAMIN WITH MINERALS) TABS tablet Take 1 tablet by mouth in the morning.   Yes [provider]  mupirocin ointment (BACTROBAN) 2 % Apply 1 Application topically daily. 03/01/23  Yes McDonald, Rachelle Hora, DPM  nitroGLYCERIN (NITROSTAT) 0.4 MG SL tablet Place 1 tablet (0.4 mg total) under the tongue every 5 (five) minutes x 3 doses as needed for chest pain. 10/14/18  Yes Kilroy, Luke K, PA-C  potassium chloride SA (KLOR-CON M) 20 MEQ tablet Take 2 tablets (40 mEq total) by mouth  every morning AND 1 tablet (20 mEq total) every evening. 05/19/23  Yes Laurey Morale, MD  quiniDINE gluconate 324 MG CR tablet TAKE 1 TABLET BY MOUTH 2 TIMES DAILY. 01/25/23  Yes Laurey Morale, MD  torsemide (DEMADEX) 20 MG tablet Take 80 mg by mouth daily.   Yes [provider]  warfarin (COUMADIN) 2.5 MG tablet Take 1-2 tablets Daily or as prescribed by [facility]. Patient taking differently: Take 2.5 mg by mouth daily at 6 (six) AM. 05/06/23  Yes Laurey Morale, MD  Torsemide 40 MG TABS Take 80 mg by mouth every morning AND 40 mg every evening. Patient not taking: Reported on 2023-06-21 05/19/23   Laurey Morale, MD     Critical care time:   CRITICAL CARE Performed by: Ihor Meinzer D. Harris   Total critical care time: 40 minutes  Critical care time was exclusive of separately billable procedures and treating other patients.  Critical care was necessary to treat or prevent imminent or life-threatening deterioration.  Critical care was time spent personally by me on the following activities: development of treatment plan with patient and/or surrogate as well as nursing, discussions with consultants, evaluation of patient's response to treatment, examination of patient, obtaining history from patient or surrogate, ordering and performing treatments and interventions, ordering and review of laboratory studies, ordering and review of radiographic studies, pulse oximetry and re-evaluation  of patient's condition.'  Ferdie Bakken D. Harris, NP-C [facility] Personal contact information can be found on Amion  If no contact or response made please call [phone] June 21, 2023, 5:41 PM

## 2023-05-25 NOTE — Progress Notes (Signed)
Location:   SNF [facility] Room Number: 43 Place of Service:  SNF (31) Provider: [facility] Nilaya Bouie NP  Laurey Morale, MD  Patient Care Team: Laurey Morale, MD as PCP - General (Cardiology) Marinus Maw, MD as PCP - Electrophysiology (Cardiology)  Extended Emergency Contact Information Primary Emergency Contact: [facility] Phone: [phone] Relation: Daughter Preferred language: [place] Secondary Emergency Contact: Raymondo Band Address: [address]          [place], [place] [address] [name] Home Phone: [phone] Mobile Phone: [phone] Relation: Spouse  Code Status: DNR Goals of care: Advanced Directive information    05/12/2023   12:00 PM  Advanced Directives  Does Patient Have a Medical Advance Directive? Yes  Type of Advance Directive Living will  Does patient want to make changes to medical advance directive? No - Patient declined  Copy of Healthcare Power of Attorney in Chart? No - copy requested     Chief Complaint  Patient presents with   Acute Visit    Generalized weakness.     HPI:  Pt is a 82 y.o. male seen today for an acute visit for generalized weakness, fluid overloaded appearance, 05/19/23 cardiology increase Torsemide/Kcl 80mg /40 meq am, 40mg /73meq pm, INR 2.5-3.5 05/19/23 Na 144, K 3.5, Bun 47, creat 2.74, wbc 8.5, Hgb 7.9, plt 140, neutrophils 79.5  Hospitalization 05/11/23-05/17/23  acute on chronic CHF  ED 05/08/23 bleeding from JP drain  ED 05/04/23 fall/near syncope  Hospitalized post procedure seroma of skin and [place] tissue, s/p JP drain placement 04/30/23   Hospitalized 04/06/23-04/22/23 for VT with ICD shocks(failed ablation), acute on chronic systolic heart failure, AKI on CKD, left upper chest wall hematoma post baro stimulator(03/19/23, s/p I&D)  complication s/p removal(04/12/23), wound infection, treated with Cipro, Augmentin, left upper chest staples intact, JP drain until f/u vascular surgery.                             Hyponatremia, 05/24/23 Na 142             CKD Bun/creat 45/3.37 05/24/23             Anemia, Hgb 9.4 05/24/23             Anticoagulation, mechanical mitral valve(Hx of MV endocarditis), on Coumadin, goal of INR 2.5-3.5             OSA on CPAP             Afib, on Amiodarone             CHB s/p CRT-D             VT, on Quinidine, Amiodarone.              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   Conductive hearing loss    uses  hearing aids   Dysrhythmia    A-fib   History of blood transfusion 03/24/2023   Hypothyroidism    Intraspinal abscess    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   Nonischemic cardiomyopathy (HCC)    Psychosexual dysfunction with inhibited sexual excitement    S/P mitral valve replacement    Syncope and collapse    Unspecified sleep apnea    last sleep study 11/07, uses cpap   Ventricular tachycardia (HCC)    VT (ventricular tachycardia) (HCC)    Past Surgical History:  Procedure Laterality Date   BIV ICD GENERATOR CHANGEOUT N/A 05/04/2017   Procedure: BiV ICD Generator Changeout;  Surgeon: Marinus Maw, MD;  Location: [facility] INVASIVE CV LAB;  Service: Cardiovascular;  Laterality: N/A;   CARDIAC CATHETERIZATION  04/24/2002   CARDIAC VALVE REPLACEMENT     CARDIOVERSION  03/09/2012   Procedure: CARDIOVERSION;  Surgeon: Marinus Maw, MD;  Location: [facility] ENDOSCOPY;  Service: Cardiovascular;  Laterality: N/A;   CARDIOVERSION N/A 11/22/2013   Procedure:  CARDIOVERSION;  Surgeon: Thurmon Fair, MD;  Location: [facility] ENDOSCOPY;  Service: Cardiovascular;  Laterality: N/A;   dental implants     ELECTROPHYSIOLOGIC STUDY N/A 09/15/2015   Procedure: V Tach Ablation;  Surgeon: Marinus Maw, MD;  Location: [facility] INVASIVE CV LAB;  Service: Cardiovascular;  Laterality: N/A;   Evacution of epidural lumbar epidural abscess  07/26/1997   INCISION AND DRAINAGE OF WOUND Left 03/25/2023   Procedure: Washout of Chest Wall Hematoma;  Surgeon: Nada Libman, MD;  Location: [facility] OR;  Service: Vascular;  Laterality: Left;   INCISION AND DRAINAGE OF WOUND N/A 03/29/2023   Procedure: IRRIGATION AND DEBRIDEMENT  CHEST WALL;  Surgeon: Nada Libman, MD;  Location: [facility] OR;  Service: Vascular;  Laterality: N/A;   INSERT / REPLACE / REMOVE PACEMAKER  11/23/2008   IR US GUIDE BX ASP/DRAIN  04/30/2023   MITRAL VALVE REPLACEMENT     w #[id number] [facility]   PERMANENT PACEMAKER INSERTION N/A 08/05/2011   Procedure: PERMANENT PACEMAKER INSERTION;  Surgeon: Marinus Maw, MD;  Location: [facility] CATH LAB;  Service: Cardiovascular;  Laterality: N/A;   REMOVAL OF BAROSTIM N/A 04/12/2023   Procedure: REMOVAL OF [name];  Surgeon: Nada Libman, MD;  Location: [facility] OR;  Service: Vascular;  Laterality: N/A;   RIGHT HEART CATH N/A 02/26/2021   Procedure: RIGHT HEART CATH;  Surgeon: Laurey Morale, MD;  Location: [facility] INVASIVE CV LAB;  Service: Cardiovascular;  Laterality: N/A;   THYROIDECTOMY     TONSILLECTOMY     V-TACH ABLATION  07/30/2021   in CE   VALVE REPLACEMENT  07/26/1998    No Known Allergies  Allergies as of 05/25/2023   No Known Allergies      Medication List        Accurate as of May 25, 2023 12:11 PM. If you have any questions, ask your nurse or doctor.          acetaminophen 500 MG tablet Commonly known as: TYLENOL Take 1,000 mg by mouth every 8 (eight) hours as needed for headache, fever or mild pain.   amiodarone 400 MG tablet Commonly known as:  PACERONE Take 400 mg by mouth daily.   levothyroxine 100 MCG tablet Commonly known as: SYNTHROID Take 1 tablet (100 mcg total) by mouth daily.   melatonin 5 MG Tabs Take 5 mg by mouth at bedtime.   midodrine 5 MG tablet Commonly known  as: PROAMATINE Take 1 tablet (5 mg total) by mouth 3 (three) times daily with meals.   mirtazapine 7.5 MG tablet Commonly known as: REMERON Take 1 tablet (7.5 mg total) by mouth at bedtime.   multivitamin with minerals Tabs tablet Take 1 tablet by mouth in the morning.   mupirocin ointment 2 % Commonly known as: BACTROBAN Apply 1 Application topically daily.   nitroGLYCERIN 0.4 MG SL tablet Commonly known as: NITROSTAT Place 1 tablet (0.4 mg total) under the tongue every 5 (five) minutes x 3 doses as needed for chest pain.   potassium chloride SA 20 MEQ tablet Commonly known as: KLOR-CON M Take 2 tablets (40 mEq total) by mouth every morning AND 1 tablet (20 mEq total) every evening.   quiniDINE gluconate 324 MG CR tablet TAKE 1 TABLET BY MOUTH 2 TIMES DAILY.   Torsemide 40 MG Tabs Take 80 mg by mouth every morning AND 40 mg every evening.   warfarin 2.5 MG tablet Commonly known as: COUMADIN Take as directed by the anticoagulation clinic. If you are unsure how to take this medication, talk to your nurse or doctor. Original instructions: Take 1-2 tablets Daily or as prescribed by Coumadin Clinic.        Review of Systems  Constitutional:  Positive for appetite change and fatigue. Negative for fever.  HENT:  Negative for congestion and trouble swallowing.   Eyes:  Negative for visual disturbance.  Respiratory:  Positive for shortness of breath. Negative for cough, chest tightness and wheezing.   Cardiovascular:  Positive for leg swelling. Negative for chest pain and palpitations.  Gastrointestinal:  Negative for abdominal pain and constipation.  Genitourinary:  Positive for frequency. Negative for dysuria and urgency.        Nocturnal urination x5-6 per night.   Musculoskeletal:  Positive for arthralgias and gait problem.  Skin:  Positive for wound.  Neurological:  Negative for speech difficulty, weakness and headaches.  Psychiatric/Behavioral:  Positive for sleep disturbance. Negative for confusion.     Immunization History  Administered Date(s) Administered   Fluad Quad(high Dose 65+) 05/17/2022   Influenza, High Dose Seasonal PF 05/11/2017, 04/18/2018, 05/04/2019, 04/18/2020, 04/27/2023   PFIZER(Purple Top)SARS-COV-2 Vaccination 08/14/2019, 09/03/2019, 05/09/2020   PNEUMOCOCCAL CONJUGATE-20 04/01/2023   PPD Test 04/23/2023   Pneumococcal Conjugate-13 01/17/2015   Pneumococcal Polysaccharide-23 01/08/2008   Td (Adult),unspecified 05/17/2013   Pertinent  Health Maintenance Due  Topic Date Due   INFLUENZA VACCINE  Completed      02/26/2021   10:56 AM 08/14/2021    4:08 PM 05/17/2022    9:50 AM 09/27/2022    3:33 PM 03/08/2023    2:19 PM  Fall Risk  Falls in the past year?   0 0 0  Was there an injury with Fall?   0 0 0  Fall Risk Category Calculator   0 0 0  Fall Risk Category (Retired)   Low    (RETIRED) Patient Fall Risk Level Moderate fall risk Low fall risk     Patient at Risk for Falls Due to    No Fall Risks No Fall Risks  Fall risk Follow up    Falls evaluation completed Falls prevention discussed;Falls evaluation completed   Functional Status Survey:    Vitals:   05/25/23 1139  BP: 122/71  Pulse: 84  Resp: 18  Temp: (!) 97.4 F (36.3 C)  SpO2: 94%  Weight: 193 lb 1.6 oz (87.6 kg)   Body mass index is 25.48 kg/m. Physical Exam  Vitals and nursing note reviewed.  Constitutional:      General: He is in acute distress.     Appearance: He is ill-appearing.  HENT:     Head: Normocephalic and atraumatic.     Nose: Nose normal.  Eyes:     Extraocular Movements: Extraocular movements intact.     Conjunctiva/sclera: Conjunctivae normal.     Pupils: Pupils are equal, round, and  reactive to light.  Cardiovascular:     Rate and Rhythm: Normal rate and regular rhythm.     Comments: R upper chest pacemaker.  Abdominal:     General: Bowel sounds are normal.     Palpations: Abdomen is soft.     Tenderness: There is no abdominal tenderness.  Musculoskeletal:     Cervical back: Normal range of motion and neck supple.     Right lower leg: Edema present.     Left lower leg: Edema present.     Comments: Dependent edematous arms, legs, BLE>BUE  Skin:    General: Skin is warm and dry.     Comments: Pigmented chronic venous insufficiency skin changes BLE, left upper chest staples remained intact, no s/s of infection. Swelling in the area present. lJP drain left upper chest intact, fullness in the area, no warmth, mild redness noted  Neurological:     General: No focal deficit present.     Mental Status: He is alert and oriented to person, place, and time. Mental status is at baseline.     Motor: No weakness.     Gait: Gait abnormal.  Psychiatric:        Mood and Affect: Mood normal.        Behavior: Behavior normal.        Thought Content: Thought content normal.        Judgment: Judgment normal.     Labs reviewed: Recent Labs    04/10/23 1405 04/11/23 0434 04/18/23 0306 04/19/23 0330 04/30/23 0400 05/01/23 0329 05/11/23 1832 05/11/23 2220 05/15/23 0246 05/17/23 0402 05/18/23 0305  NA 130*   < > 130*   < > 139   < > 132*   < > 136 140 139  K 4.0   < > 3.4*   < > 2.7*   < > 6.2*   < > 3.5 3.0* 3.6  CL 95*   < > 91*   < > 95*   < > 97*   < > 99 99 98  CO2 24   < > 27   < > 29   < > 22   < > 26 30 29   GLUCOSE 224*   < > 166*   < > 87   < > 105*   < > 91 85 103*  BUN 35*   < > 48*   < > 33*   < > 48*   < > 50* 40* 43*  CREATININE 2.28*   < > 3.88*   < > 2.66*   < > 4.14*   < > 3.73* 3.15* 3.43*  CALCIUM 8.0*   < > 8.2*   < > 7.9*   < > 8.3*   < > 8.1* 8.2* 8.0*  MG  --    < > 2.4  --  2.1  --  2.6*  --   --   --   --   PHOS 4.0  --   --   --   --   --   --    --   --   --   --    < > =  values in this interval not displayed.   Recent Labs    05/04/23 1516 05/08/23 2032 05/11/23 1832  AST 33 47* 99*  ALT 27 28 62*  ALKPHOS 103 107 121  BILITOT 1.0 1.3* 1.1  PROT 5.7* 6.3* 5.3*  ALBUMIN 3.0* 3.2* 2.9*   Recent Labs    05/04/23 1750 05/08/23 2032 05/11/23 1832 05/12/23 0426 05/15/23 0246 05/16/23 0254 05/17/23 0402 05/18/23 0305  WBC 8.8   < > 9.6 11.4*   < > 7.4 7.8 7.4  NEUTROABS 7.0  --  7.3 9.1*  --   --   --   --   HGB 9.2*   < > 8.0* 7.4*   < > 7.1* 8.5* 8.4*  HCT 29.7*   < > 25.9* 23.9*   < > 23.4* 27.0* 26.0*  MCV 99.3   < > 100.0 99.2   < > 95.9 95.4 95.6  PLT 133*   < > 178 159   < > 153 156 149*   < > = values in this interval not displayed.   Lab Results  Component Value Date   TSH 12.925 (H) 05/11/2023   No results found for: "HGBA1C" Lab Results  Component Value Date   CHOL 158 08/23/2015   HDL 50 08/23/2015   LDLCALC 82 08/23/2015   TRIG 130 08/23/2015   CHOLHDL 3.2 08/23/2015    Significant Diagnostic Results in last 30 days:  CT Head Wo Contrast  Result Date: 05/11/2023 CLINICAL DATA:  Mental status change EXAM: CT HEAD WITHOUT CONTRAST TECHNIQUE: Contiguous axial images were obtained from the base of the skull through the vertex without intravenous contrast. RADIATION DOSE REDUCTION: This exam was performed according to the departmental dose-optimization program which includes automated exposure control, adjustment of the mA and/or kV according to patient size and/or use of iterative reconstruction technique. COMPARISON:  CT brain 05/04/2023 FINDINGS: Brain: No hemorrhage or intracranial mass. 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 Chronic small vessel ischemic changes of the white matter and atrophy. Question hypodensity within the medial left temporal lobe, series 2 image 9 and 10, but appearance on sagittal views suggest potentially related to artifact.  Vascular: No hyperdense vessels. Vertebral and carotid vascular calcification Skull: Normal. Negative for fracture or focal lesion. Sinuses/Orbits: No acute finding. Other: None IMPRESSION: 1. Negative for hemorrhage, mass, mass effect or midline shift 2. Chronic small vessel ischemic changes of the white matter and atrophy. 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 3. Hypodensity at the medial left temporal lobe potentially due to artifact though appearance on axial views makes it difficult to exclude acute to subacute infarct. Electronically Signed   By: Jasmine Pang M.D.   On: 05/11/2023 21:55   DG Chest Port 1 View  Result Date: 05/11/2023 CLINICAL DATA:  Weakness EXAM: PORTABLE CHEST 1 VIEW COMPARISON:  05/04/2023, 04/09/2023 FINDINGS: Post sternotomy changes and valve prosthesis. Right-sided multi lead pacing device as before. Low lung volumes. Cardiomegaly. Chronic elevation right diaphragm. Mild central congestion. No consolidation. Possible small left effusion. Removal of previously noted left upper chest drainage catheter IMPRESSION: Cardiomegaly with mild central congestion. Possible small left effusion. Electronically Signed   By: Jasmine Pang M.D.   On: 05/11/2023 21:50   CT Chest Wo Contrast  Result Date: 05/08/2023 CLINICAL DATA:  Chest trauma EXAM: CT CHEST WITHOUT CONTRAST TECHNIQUE: Multidetector CT imaging of the chest was performed following the standard protocol without IV contrast. RADIATION DOSE REDUCTION: This exam was performed  according to the departmental dose-optimization program which includes automated exposure control, adjustment of the mA and/or kV according to patient size and/or use of iterative reconstruction technique. COMPARISON:  Chest CT 04/29/2023 FINDINGS: Cardiovascular: The heart is moderately enlarged. The aorta is normal in size. There is no pericardial effusion. Right-sided ICD is present. Mediastinum/Nodes: Left thyroid lobe is atrophic  or surgically absent. Right thyroid is within limits. No enlarged lymph nodes are seen. Esophagus is within normal limits. Lungs/Pleura: Small amount of airspace disease in the anterior left upper lobe appears similar to the prior study. There some ground-glass and interstitial opacities in both lung bases and atelectasis in the right lower lobe, similar to prior. There is no pneumothorax. Upper Abdomen: There is a suggestion of nodular liver contour. This is unchanged. Musculoskeletal: There is bilateral gynecomastia. Percutaneous drainage catheter is seen in the anterior upper left chest wall. Previously identified fluid collection is no longer seen. There is soft tissue swelling and edema in this region in the subcutaneous tissues and within the muscular compartments. There some new small lymph nodes in this region as well. There is no evidence for soft tissue gas. Bilateral gynecomastia is again noted. No acute fractures are seen. There are degenerative changes of the shoulders. Sternotomy wires are present. IMPRESSION: 1. Percutaneous drainage catheter in the anterior upper left chest wall. Previously identified fluid collection is no longer seen. There is soft tissue swelling and edema in this region with new small lymph nodes. No soft tissue gas. Findings may be related to cellulitis. 2. Stable moderate cardiomegaly. 3. Stable bilateral lower lobe ground-glass and interstitial opacities, likely infectious/inflammatory. 4. Stable focal airspace disease in the left upper lobe, indeterminate for atelectasis or infection. Follow-up imaging recommended to confirm complete resolution. 5. Nodular liver contour may represent cirrhosis. Electronically Signed   By: Darliss Cheney M.D.   On: 05/08/2023 22:57   CT HEAD WO CONTRAST ( )  Result Date: 05/04/2023 CLINICAL DATA:  Polytrauma, blunt. Fall to the ground in the setting of near syncope and lightheadedness, on Coumadin for mechanical valve replacement. Recent  thoracic chest procedure. EXAM: CT HEAD WITHOUT CONTRAST TECHNIQUE: Contiguous axial images were obtained from the base of the skull through the vertex without intravenous contrast. RADIATION DOSE REDUCTION: This exam was performed according to the departmental dose-optimization program which includes automated exposure control, adjustment of the mA and/or kV according to patient size and/or use of iterative reconstruction technique. COMPARISON:  Head CT and MRI 05/23/2005 FINDINGS: Brain: There is no evidence of an acute infarct, intracranial hemorrhage, mass, midline shift, or extra-axial fluid collection. A small chronic right frontal cortical infarct appears larger than on the prior studies. There are new small infarcts in the left frontal lobe and right cerebellar hemisphere which are chronic in appearance. Hypodensities elsewhere in the cerebral white matter bilaterally have progressed and are nonspecific but compatible with mild chronic small vessel ischemic disease. There is mild cerebral atrophy. A cavum septum pellucidum et vergae is noted, a normal variant. Vascular: Calcified atherosclerosis at the skull base. No hyperdense vessel. Skull: No acute fracture or suspicious osseous lesion. Sinuses/Orbits: Visualized paranasal sinuses and mastoid air cells are clear. Unremarkable orbits. Other: None. IMPRESSION: 1. No evidence of acute intracranial abnormality. 2. 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 Electronically Signed   By: Sebastian Ache M.D.   On: 05/04/2023 18:10   DG Chest 2 View  Result Date: 05/04/2023 CLINICAL DATA:  Near-syncope. Recent thoracic procedure. On Coumadin EXAM: CHEST -  2 VIEW COMPARISON:  X-ray 04/09/2023.  CT 04/29/2023 FINDINGS: Underinflation. Enlarged cardiopericardial silhouette with some vascular congestion. Question tiny effusions. There is some basilar atelectasis. No pneumothorax. Prosthetic cardiac valve. Right upper chest defibrillator with leads along  the right side of the heart including the coronary sinus. Left upper chest wall pigtail catheter again seen with skin staples. IMPRESSION: Postop chest with enlarged heart, vascular congestion, small effusions and defibrillator. No pneumothorax. Pigtail catheter overlying the left upper chest with skin staples. Underinflation. Electronically Signed   By: Karen Kays M.D.   On: 05/04/2023 17:26   IR US Guide Bx Asp/Drain  Result Date: 04/30/2023 INDICATION: 82 year old with history of a Barostim device removed from the left chest. Patient had a JP drain placed in surgery for a seroma. The drain has been dislodged. Patient has a recurrent superficial fluid collection in the left anterior chest. EXAM: ULTRASOUND-GUIDED PLACEMENT OF DRAINAGE CATHETER IN LEFT ANTERIOR CHEST WALL FLUID COLLECTION MEDICATIONS: 1% lidocaine for local anesthetic ANESTHESIA/SEDATION: None COMPLICATIONS: None immediate. PROCEDURE: Informed written consent was obtained from the patient after a thorough discussion of the procedural risks, benefits and alternatives. All questions were addressed. Maximal Sterile Barrier Technique was utilized including caps, mask, sterile gowns, sterile gloves, sterile drape, hand hygiene and skin antiseptic. A timeout was performed prior to the initiation of the procedure. Ultrasound demonstrated a large fluid collection in the left anterior chest. Left chest was prepped and draped in sterile fashion. Skin was anesthetized with 1% lidocaine along the lateral aspect of the chest. Small incision was made. Using ultrasound guidance, an 18 gauge trocar needle was directed into the fluid collection and brown fluid was aspirated. Superstiff Amplatz wire was placed. Tract was dilated to accommodate a 12 [place] multipurpose drain. Greater than 150 mL of fluid was initially removed. Drain was sutured to the skin and attached to a suction bulb. Dressing was placed. Ultrasound images were taken and saved for this  procedure. FINDINGS: Large relatively simple fluid collection in the left anterior chest wall. Drain was confirmed within the fluid collection using ultrasound guidance. Greater than 150 mL of brown fluid was removed. IMPRESSION: Ultrasound-guided placement of drainage catheter in the left anterior chest wall fluid collection. Electronically Signed   By: Richarda Overlie M.D.   On: 04/30/2023 19:18   CT Chest Wo Contrast  Result Date: 04/29/2023 CLINICAL DATA:  Postoperative swelling. Recent removal of marrow stem device. JP drain removed 4 days ago. EXAM: CT CHEST WITHOUT CONTRAST TECHNIQUE: Multidetector CT imaging of the chest was performed following the standard protocol without IV contrast. RADIATION DOSE REDUCTION: This exam was performed according to the departmental dose-optimization program which includes automated exposure control, adjustment of the mA and/or kV according to patient size and/or use of iterative reconstruction technique. COMPARISON:  Radiograph 04/09/2023. FINDINGS: Cardiovascular: The heart is enlarged. Prosthetic mitral valve. Pacemaker in place. No pericardial effusion. Aortic atherosclerosis without aneurysm. No pericardial effusion. Mediastinum/Nodes: No mediastinal adenopathy. Assessment for hilar adenopathy is limited in the absence of IV contrast. No definite enlarged axillary nodes. Left lobe of the thyroid gland is diminutive or absent. Decompressed esophagus. Lungs/Pleura: Anterior left upper lobe opacity with air bronchogram is indeterminate for atelectasis or infection. Compressive atelectasis in the right lower lobe related to elevated hemidiaphragm. No pleural effusion or features of pulmonary edema. Upper Abdomen: Fatty atrophy of the pancreas. No acute upper abdominal findings. Musculoskeletal: Ill-defined fluid collection in the left anterior chest wall. Insert measurements are difficult due to irregular shape, however  this measures approximately 13.8 x 4.6 x 10.4 cm, series  3, image 62 and series 5, image 101. This contains an air-fluid level medially. Adjacent strandy density and ill-defined fluid anteriorly and tracking inferiorly. Mild bilateral gynecomastia. Skin staples in the anterior left upper chest wall at the superior aspect of fluid collection. There are no acute or suspicious osseous abnormalities. Bilateral gynecomastia. Chronic bilateral shoulder arthropathy. Prior median sternotomy. IMPRESSION: 1. Ill-defined fluid collection in the left anterior chest wall measuring approximately 13.8 x 4.6 x 10.4 cm. This contains an air-fluid level medially. This may represent a postoperative seroma, hematoma, or abscess. Sterility is indeterminate by imaging. 2. Anterior left upper lobe opacity with air bronchogram is indeterminate for atelectasis or infection. 3. Cardiomegaly. Aortic Atherosclerosis (ICD10-I70.0). Electronically Signed   By: Narda Rutherford M.D.   On: 04/29/2023 18:35    Assessment/Plan: Acute on chronic systolic CHF (congestive heart failure) (HCC) generalized weakness, fluid overloaded appearance, 05/19/23 cardiology increase Torsemide/Kcl 80mg /40 meq am, 40mg /61meq pm, INR 2.5-3.5 05/19/23 Na 144, K 3.5, Bun 47, creat 2.74, wbc 8.5, Hgb 7.9, plt 140, neutrophils 79.5 ED for further evaluation and treatment.   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     Family/ staff Communication: plan of care reviewed with the patient and charge nurse.   Labs/tests ordered:  none  Time spend 35 minutes.

## 2023-05-25 NOTE — Progress Notes (Signed)
EPIC Encounter for ICM Monitoring  Patient Name: Austin [name] is a 82 y.o. male Date: 05/25/2023 Primary Care Physican: Laurey Morale, MD Primary Care Physican: Laurey Morale, MD Primary Cardiologist: [facility] Electrophysiologist: Ladona Ridgel 08/10/2022 Weight: 202 lbs 09/01/2022   Weight: 202 lbs 09/28/2022   Weight: 200 lbs 11/09/2022 Weight: 192.7 lbs 12/22/2022 Weight: 191 lbs 02/21/2023 Weight: 193.5 lbs 03/01/2023 Weight: 192 lbs    Since 19-May-2023 Time in AT/AF  0.0 hr/day (0.0%)           Pt currently in ED from [facility].     Optivol Thoracic impedance suggesting ongoing possible fluid accumulation starting 9/28.   Prescribed:  Furosemide 20 mg Take 4 tablets (80 mg total) by mouth twice a day.    Potassium 20 mEq take 1 tablet twice a day Spironolactone 25 mg take 0.5 tablet by mouth daily   Labs: 05/08/2023 Creatinine 3.64, BUN 33, Potassium 4.3, Sodium 139, GFR 16  05/05/2023 Creatinine 2.90, BUN 26, Potassium 3.5, Sodium 137, GFR 21  05/04/2023 Creatinine 2.65, BUN 30, Potassium 3.3, Sodium 138, GFR 24  05/03/2023 Creatinine 2.6, BUN 29, Potassium 3.5, Sodium 140 05/02/2023 Creatinine 2.24, BUN 27, Potassium 3.3, Sodium 140, GFR 29  A complete set of results can be found in Results Review.   Recommendations:  Pt currently in ED.    Follow-up plan: ICM clinic phone appointment on 06/13/2023   91 day device clinic remote transmission 06/22/2023.      EP/Cardiology Office Visits:   05/25/2023 with HF clinic.   06/16/2023 with Dr Shirlee Latch.    06/01/2023 with Dr Ladona Ridgel.     Copy of ICM check sent to Dr. Ladona Ridgel.     3 month ICM trend: 05/25/2023.    12-14 Month ICM trend:     Karie Soda, RN 05/25/2023 12:25 PM

## 2023-05-25 NOTE — ED Triage Notes (Signed)
[facility] worsening shob over the last few days. Today pt unable to get out of bed. Endorses decreased PO intake with nausea. Pt endorses slurred speech X2 hours.  98/48 HR 80 CBG 183 RR 14 97% on chronic 2L [place]

## 2023-05-25 NOTE — ED Notes (Signed)
Help get patient straighten up I the bed placed some hospital socks p[atint is resting with call bell in reach and family at bedside

## 2023-05-25 NOTE — Progress Notes (Signed)
Assisting Roxy Horseman, updated Dr. Joaquin Music that upon patient's arrival to unit the L chest tube was found to have become dislodged and three way occlusive dressing applied. Informed that CCM was to be at bedside momentarily. No new orders.

## 2023-05-25 NOTE — Assessment & Plan Note (Signed)
mechanical mitral valve(Hx of MV endocarditis), on Coumadin, goal of INR 2.5-3.5

## 2023-05-25 NOTE — Assessment & Plan Note (Signed)
Heart rate is in control.  

## 2023-05-25 NOTE — Assessment & Plan Note (Signed)
Hgb 9.4 05/24/23

## 2023-05-25 NOTE — Assessment & Plan Note (Signed)
on Levothyroxine-unable to location dose adjustment,  TSH 18.44 04/06/23

## 2023-05-25 NOTE — Progress Notes (Signed)
PHARMACY - ANTICOAGULATION CONSULT NOTE  Pharmacy Consult for warfarin Indication:  mechanical MVR + AFib  No Known Allergies  Patient Measurements:   Heparin Dosing Weight: 87.6 kg   Vital Signs: Temp: 97.4 F (36.3 C) (10/30 1233) Temp Source: Oral (10/30 1233) BP: 84/58 (10/30 1515) Pulse Rate: 86 (10/30 1515)  Labs: Recent Labs    05/25/23 1215 05/25/23 1233  HGB 10.0* 11.9*  HCT 34.1* 35.0*  PLT 216  --   LABPROT 29.6*  --   INR 2.8*  --   CREATININE 4.12* 4.30*  TROPONINIHS 72*  --     Estimated Creatinine Clearance: 15.5 mL/min (A) (by C-G formula based on SCr of 4.3 mg/dL (H)).   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   Conductive hearing loss    uses hearing aids   Dysrhythmia    A-fib   History of blood transfusion 03/24/2023   Hypothyroidism    Intraspinal abscess    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   Nonischemic cardiomyopathy (HCC)    Psychosexual dysfunction with inhibited sexual excitement    S/P mitral valve replacement    Syncope and collapse    Unspecified sleep apnea    last sleep study 11/07, uses cpap   Ventricular tachycardia (HCC)    VT (ventricular tachycardia) (HCC)     Medications:  Scheduled:   Assessment: 82 yom presenting with severe hyperkalemia, and shock. On warfarin PTA for hx mechanical MVR (LD 10/29) - PTA regimen 2.5 mg daily.   Hgb 11.9, plt 216. Trop 72. INR came back therapeutic at 2.8. No s/sx of bleeding.   Goal of Therapy:  INR 2.5-3.5 Monitor platelets by anticoagulation protocol: Yes   Plan:  Order warfarin 2.5 mg tonight Monitor INR, CBC, and for s/sx of  bleeding daily   Thank you for allowing pharmacy to participate in this patient's care,  Sherron Monday, PharmD, BCCCP Clinical Pharmacist  Phone: [phone] 05/25/2023 4:07 PM  Please check AMION for all [facility] Pharm[facility] phone numbers After 10:00 PM, call Main Pharmacy [phone]

## 2023-05-25 NOTE — Consult Note (Signed)
Consultation Note Date: 05/14/2023   Patient Name: Austin [name]  DOB: 08/26/1940  MRN: [id number]  Age / Sex: 82 y.o., male  PCP: Laurey Morale, MD Referring Physician: Dorthula Nettles, DO  Reason for Consultation: Establishing goals of care, "Frequent admits HF "  HPI/Patient Profile: 82 y.o. male  with past medical history of mechanical mitral valve s/p MV endocarditis, VT, nonischemic cardiomyopathy, complete heart block, CHF, [employer] CRT-D device implanted, afib presented to [facility] ED on 05/21/2023 from [facility] with complaints of progressively worsening shortness of breath and weakness for several days. He was admitted on 05/22/2023 with cardiogenic shock, hyperkalemia, and elevated LFTs.   Of note, patient has had 5 admissions and 2 ED visits in the last 6 months. He is a 7 day readmission.  Clinical Assessment and Goals of Care: I have reviewed medical records including EPIC notes, labs, advanced directive documents, and imaging. Received report from primary RN - no acute concerns. RN reports levo has been started and plans for transfer to ICU.  Went to visit patient at bedside - no family/visitors present. Patient was lying in bed awake, alert, oriented, and able to participate in conversation. He talks to me with his eyes closed. He appears ill and frail with mild conversational dyspnea. He is on 2L O2 [place]. Endorses shortness of breath "a little bit;" no pain. Levophed infusing.  Met with patient  to discuss diagnosis, prognosis, GOC, EOL wishes, disposition, and options.  I introduced Palliative Medicine as specialized medical care for people living with serious illness. It focuses on providing relief from the symptoms and stress of a serious illness. The goal is to improve quality of life for both the patient and the family.  Patient seems with low energy reserve and indicates he is not up  for long GOC today but open to discuss more pertinent decisions. Patient has a clear understanding of his current acute medical situation. Discussed options of pursuing aggressive medical intervention (ICU admission, pressor support, treating the treatable for life prolonging interventions) vs comfort/hospice care (focusing more on symptom management, comfort, quality of life, letting nature take it's course). Patient indicates that for today he would like to pursue medical intervention to prolong life (ok with ICU admission) - he wants time to talk to his wife and daughter - he would like to continue GOC discussions regarding hospice tomorrow and is hopeful they can participate.  Confirmed DNR/DNI.   Patient gives permission to call his wife  -he is hopeful she is on her way to the hospital. His daughter lives in [place].   1:25 PM Attempted to call spouse/Austin [name] - no answer - confidential voicemail left and PMT phone number provided with request to return call.   Austin [name] seemed to accidentally return call - could hear her talking to someone on the other end of the line; however, she did not know she was on the phone. It sounded as if she was in the car.  Called again to attempt contact - no answer.    Primary Decision Maker: PATIENT    SUMMARY OF RECOMMENDATIONS   Admit to ICU, treat the treatable DNR/DNI confirmed Ongoing GOC pending clinical course Patient open to discuss hospice tomorrow 06-18-23 - he wants to speak with his family prior to making final decisions. He is hopeful his wife and daughter can participate in GOC discussions - attempting to arrange family meeting PMT will continue to follow and support holistically   Code Status/Advance Care Planning: DNR  Palliative  Prophylaxis:  Aspiration, Delirium Protocol, Frequent Pain Assessment, Oral Care, and Turn Reposition  Additional Recommendations (Limitations, Scope, Preferences): Full Scope Treatment  Psycho-social/Spiritual:   Desire for further Chaplaincy support:no Created space and opportunity for patient and family to express thoughts and feelings regarding patient's current medical situation.  Emotional support and therapeutic listening provided.  Prognosis:  Unable to determine  Discharge Planning: To Be Determined      Primary Diagnoses: Present on Admission:  Shock (HCC)   I have reviewed the medical record, interviewed the patient and family, and examined the patient. The following aspects are pertinent.  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   Conductive hearing loss    uses hearing aids   Dysrhythmia    A-fib   History of blood transfusion 03/24/2023   Hypothyroidism    Intraspinal abscess    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   Nonischemic cardiomyopathy (HCC)    Psychosexual dysfunction with inhibited sexual excitement    S/P mitral valve replacement    Syncope and collapse    Unspecified sleep apnea    last sleep study 11/07, uses cpap   Ventricular tachycardia (HCC)    VT (ventricular tachycardia) (HCC)    Social History   Socioeconomic History   Marital status: Married    Spouse name: Not on file   Number of children: 1   Years of education: 18   Highest education level: Master's degree (e.g., MA, MS, MEng, MEd, MSW, MBA)  Occupational History   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  Has one daughter.  Retired.  Education: Masters.   Social Determinants of Health   Financial Resource Strain: Low Risk  (03/08/2023)   Overall Financial Resource Strain (CARDIA)    Difficulty of Paying Living Expenses: Not hard at all  Food Insecurity: No Food Insecurity (05/12/2023)   Hunger Vital Sign    Worried About Running Out of Food in the Last Year: Never true    Ran Out of Food in the Last Year: Never true  Transportation Needs: No Transportation Needs (05/12/2023)   PRAPARE - Administrator, Civil Service (Medical): No    Lack of Transportation (Non-Medical): No  Physical Activity: Insufficiently Active (03/08/2023)   Exercise Vital Sign    Days of Exercise per Week: 4 days    Minutes of Exercise per Session: 30 min  Stress: No Stress Concern Present (03/08/2023)   [employer] of Occupational Health - Occupational Stress Questionnaire    Feeling of Stress : Not at all  Social Connections: Moderately Integrated (03/08/2023)   Social Connection and Isolation Panel [NHANES]    Frequency of Communication with Friends and Family: More than three times a week    Frequency of Social Gatherings  with Friends and Family: Twice a week    Attends Religious Services: More than 4 times per year    Active Member of Clubs or Organizations: No    Attends Engineer, structural: Never    Marital Status: Married   Family History  Problem Relation Age of Onset   Heart disease Mother    Heart failure Mother    Heart disease Father    Heart failure Father    Sleep apnea Neg Hx    Scheduled Meds:  midodrine  5 mg Oral TID WC   sodium zirconium cyclosilicate  10 g Oral Once   Continuous Infusions:  sodium chloride     calcium gluconate     norepinephrine (LEVOPHED) Adult infusion 5 mcg/min (05/18/2023 1249)   PRN Meds:. Medications Prior to Admission:  Prior to Admission  medications   Medication Sig Start Date End Date Taking? Authorizing Provider  acetaminophen (TYLENOL) 500 MG tablet Take 1,000 mg by mouth every 8 (eight) hours as needed for headache, fever or mild pain.    [provider]  amiodarone (PACERONE) 400 MG tablet Take 400 mg by mouth daily.    [provider]  levothyroxine (SYNTHROID) 100 MCG tablet Take 1 tablet (100 mcg total) by mouth daily. 05/19/23   Venita Sheffield, MD  melatonin 5 MG TABS Take 5 mg by mouth at bedtime.    [provider]  midodrine (PROAMATINE) 5 MG tablet Take 1 tablet (5 mg total) by mouth 3 (three) times daily with meals. 05/17/23 06/16/23  Arrien, York Ram, MD  mirtazapine (REMERON) 7.5 MG tablet Take 1 tablet (7.5 mg total) by mouth at bedtime. 04/25/23   Venita Sheffield, MD  Multiple Vitamin (MULTIVITAMIN WITH MINERALS) TABS tablet Take 1 tablet by mouth in the morning.    [provider]  mupirocin ointment (BACTROBAN) 2 % Apply 1 Application topically daily. 03/01/23   McDonald, Rachelle Hora, DPM  nitroGLYCERIN (NITROSTAT) 0.4 MG SL tablet Place 1 tablet (0.4 mg total) under the tongue every 5 (five) minutes x 3 doses as needed for chest pain. 10/14/18   Abelino Derrick, PA-C  potassium chloride SA (KLOR-CON M) 20 MEQ tablet Take 2 tablets (40 mEq total) by mouth every morning AND 1 tablet (20 mEq total) every evening. 05/19/23   Laurey Morale, MD  quiniDINE gluconate 324 MG CR tablet TAKE 1 TABLET BY MOUTH 2 TIMES DAILY. 01/25/23   Laurey Morale, MD  Torsemide 40 MG TABS Take 80 mg by mouth every morning AND 40 mg every evening. 05/19/23   Laurey Morale, MD  warfarin (COUMADIN) 2.5 MG tablet Take 1-2 tablets Daily or as prescribed by Coumadin Clinic. Patient not taking: Reported on 05/19/2023 05/06/23   Laurey Morale, MD   No Known Allergies Review of Systems  Constitutional:  Positive for activity change and fatigue.  Respiratory:  Positive for shortness of  breath.   Gastrointestinal:  Negative for nausea and vomiting.  Neurological:  Positive for weakness.  All other systems reviewed and are negative.   Physical Exam Vitals and nursing note reviewed.  Constitutional:      General: He is not in acute distress.    Appearance: He is ill-appearing.  Pulmonary:     Effort: No respiratory distress.     Comments: dyspnea Skin:    General: Skin is warm and dry.  Neurological:     Mental Status: He is alert and oriented to person, place, and time.  Motor: Weakness present.  Psychiatric:        Attention and Perception: Attention normal.        Behavior: Behavior is cooperative.        Cognition and Memory: Cognition and memory normal.     Vital Signs: BP 111/73   Pulse 88   Temp (!) 97.4 F (36.3 C) (Oral)   Resp (!) 30   SpO2 97%  Pain Scale: 0-10   Pain Score: 0-No pain   SpO2: SpO2: 97 % O2 Device:SpO2: 97 % O2 Flow Rate: .   IO: Intake/output summary: No intake or output data in the 24 hours ending 06-18-2023 1301  LBM:   Baseline Weight:   Most recent weight:       Palliative Assessment/Data: PPS 50%     Time In: 1300 Time Out: 1430 Time Total: 90 minutes  Signed by: Haskel Khan, NP   Please contact Palliative Medicine Team phone at [phone] for questions and concerns.  For individual provider: See Amion  *Portions of this note are a verbal dictation therefore any spelling and/or grammatical errors are due to the "Dragon Medical One" system interpretation.

## 2023-05-25 NOTE — Progress Notes (Signed)
This RN spoke with E-link MD, Dr. Warrick Parisian in regards to patient code status of DNR-Limited.  This RN inquired about giving ACLS meds during pre-arrest conditions. As per code status, this RN is to give appropriate non-invasive medical interventions.   While speaking to MD, pt experienced bradycardia in the 20's. This RN asked MD if atropine should be given, MD responded no.   MD explained that we are not escalating medical care and will transition to comfort care when the patients blood pressure begins to drop, but awaiting parameters. MD referencing prior notes in patients chart of no escalation of care.  Patients blood pressure is being maintained with levophed at a set rate of 10 mcg. Transition to comfort care pending till arrival of family or "drop in blood pressure" as per MD.

## 2023-05-25 NOTE — ED Notes (Signed)
ED TO INPATIENT HANDOFF REPORT  ED Nurse Name and Phone #: Lutie Pickler [phone]  S Name/Age/Gender Austin [name] 82 y.o. male Room/Bed: 033C/033C  Code Status   Code Status: Limited: Do not attempt resuscitation (DNR) -DNR-LIMITED -Do Not Intubate/DNI   Home/SNF/Other Skilled nursing facility Patient oriented to: self, place, time, and situation Is this baseline? Yes   Triage Complete: Triage complete  Chief Complaint Shock [facility]) [R57.9] Cardiogenic shock (HCC) [R57.0]  Triage Note [facility] worsening shob over the last few days. Today pt unable to get out of bed. Endorses decreased PO intake with nausea. Pt endorses slurred speech X2 hours.  98/48 HR 80 CBG 183 RR 14 97% on chronic 2L [place]   Allergies No Known Allergies  Level of Care/Admitting Diagnosis ED Disposition     ED Disposition  Admit   Condition  --   Comment  Hospital Area: [facility] [[id number]]  Level of Care: ICU [6]  May admit patient to [facility] or [facility] if equivalent level of care is available:: No  Covid Evaluation: Asymptomatic - no recent exposure (last 10 days) testing not required  Diagnosis: Cardiogenic shock (HCC) [785.51.ICD-9-CM]  Admitting Physician: Dorthula Nettles [[id number]]  Attending Physician: Dorthula Nettles [phone]  Bed request comments: 2H  Certification:: I certify this patient will need inpatient services for at least 2 midnights          B Medical/Surgery History 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   Conductive hearing loss    uses hearing aids   Dysrhythmia    A-fib   History of blood transfusion 03/24/2023    Hypothyroidism    Intraspinal abscess    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   Nonischemic cardiomyopathy (HCC)    Psychosexual dysfunction with inhibited sexual excitement    S/P mitral valve replacement    Syncope and collapse    Unspecified sleep apnea    last sleep study 11/07, uses cpap   Ventricular tachycardia (HCC)    VT (ventricular tachycardia) (HCC)    Past Surgical History:  Procedure Laterality Date   BIV ICD GENERATOR CHANGEOUT N/A 05/04/2017   Procedure: BiV ICD Generator Changeout;  Surgeon: Marinus Maw, MD;  Location[facility] INVASIVE CV LAB;  Service: Cardiovascular;  Laterality: N/A;   CARDIAC CATHETERIZATION  04/24/2002   CARDIAC VALVE REPLACEMENT     CARDIOVERSION  03/09/2012   Procedure: CARDIOVERSION;  Surgeon: Marinus Maw, MD;  Location: [facility] ENDOSCOPY;  Service: Cardiovascular;  Laterality: N/A;   CARDIOVERSION N/A 11/22/2013   Procedure: CARDIOVERSION;  Surgeon: Thurmon Fair, MD;  Location: [facility] ENDOSCOPY;  Service: Cardiovascular;  Laterality: N/A;   dental implants     ELECTROPHYSIOLOGIC STUDY N/A 09/15/2015   Procedure: V Tach Ablation;  Surgeon: Marinus Maw, MD;  Location: [facility] INVASIVE CV LAB;  Service: Cardiovascular;  Laterality: N/A;   Evacution of epidural lumbar epidural abscess  07/26/1997   INCISION AND DRAINAGE OF WOUND Left 03/25/2023   Procedure: Washout of Chest Wall Hematoma;  Surgeon: Nada Libman, MD;  Location: [facility] OR;  Service: Vascular;  Laterality: Left;   INCISION AND  DRAINAGE OF WOUND N/A 03/29/2023   Procedure: IRRIGATION AND DEBRIDEMENT  CHEST WALL;  Surgeon: Nada Libman, MD;  Location: [facility] OR;  Service: Vascular;  Laterality: N/A;   INSERT / REPLACE / REMOVE PACEMAKER  11/23/2008   IR US GUIDE BX ASP/DRAIN  04/30/2023   MITRAL VALVE REPLACEMENT     w #[id number] [facility]   PERMANENT PACEMAKER INSERTION N/A 08/05/2011   Procedure: PERMANENT PACEMAKER INSERTION;  Surgeon: Marinus Maw, MD;  Location: [facility] CATH LAB;  Service: Cardiovascular;  Laterality: N/A;   REMOVAL OF BAROSTIM N/A 04/12/2023   Procedure: REMOVAL OF [name];  Surgeon: Nada Libman, MD;  Location: [facility] OR;  Service: Vascular;  Laterality: N/A;   RIGHT HEART CATH N/A 02/26/2021   Procedure: RIGHT HEART CATH;  Surgeon: Laurey Morale, MD;  Location: [facility] INVASIVE CV LAB;  Service: Cardiovascular;  Laterality: N/A;   THYROIDECTOMY     TONSILLECTOMY     V-TACH ABLATION  07/30/2021   in CE   VALVE REPLACEMENT  07/26/1998     A IV Location/Drains/Wounds Patient Lines/Drains/Airways Status     Active Line/Drains/Airways     Name Placement date Placement time Site Days   Peripheral IV 05/25/23 20 G Left Antecubital 05/25/23  1238  Antecubital  less than 1   Peripheral IV 05/25/23 20 G 1.88" Anterior;Left Forearm 05/25/23  1456  Forearm  less than 1   Closed System Drain 1 Left;Superior Chest 12 Fr. 04/30/23  1244  Chest  25   Wound / Incision (Open or Dehisced) 05/08/23 Skin tear Shoulder Left skin tear to left deltoid area 05/08/23  --  Shoulder  17   Wound / Incision (Open or Dehisced) 05/12/23 Other (Comment) Chest Left;Upper Surgical wound, well approximated with staples 05/12/23  1722  Chest  13            Intake/Output Last 24 hours No intake or output data in the 24 hours ending 05/25/23 1604  Labs/Imaging Results for orders placed or performed during the hospital encounter of 05/25/23 (from the past 48 hour(s))  Resp panel by RT-PCR (RSV, Flu A&B, Covid) Anterior Nasal Swab     Status: None   Collection Time: 05/25/23 12:08 PM   Specimen: Anterior Nasal Swab  Result Value Ref Range   SARS Coronavirus 2 by RT PCR NEGATIVE NEGATIVE   Influenza A by PCR NEGATIVE NEGATIVE   Influenza B by PCR NEGATIVE NEGATIVE    Comment: (NOTE) The Xpert Xpress SARS-CoV-2/FLU/RSV plus assay is intended as an aid in the diagnosis of influenza from Nasopharyngeal swab specimens and should not be  used as a sole basis for treatment. Nasal washings and aspirates are unacceptable for Xpert Xpress SARS-CoV-2/FLU/RSV testing.  Fact Sheet for Patients: BloggerCourse.com  Fact Sheet for Healthcare Providers: SeriousBroker.it  This test is not yet approved or cleared by the [place] FDA and has been authorized for detection and/or diagnosis of SARS-CoV-2 by FDA under an Emergency Use Authorization (EUA). This EUA will remain in effect (meaning this test can be used) for the duration of the COVID-19 declaration under Section 564(b)(1) of the Act, 21 U.S.C. section 360bbb-3(b)(1), unless the authorization is terminated or revoked.     Resp Syncytial Virus by PCR NEGATIVE NEGATIVE    Comment: (NOTE) Fact Sheet for Patients: BloggerCourse.com  Fact Sheet for Healthcare Providers: SeriousBroker.it  This test is not yet approved or cleared by the [place] FDA and has been authorized for detection and/or diagnosis of  SARS-CoV-2 by FDA under an Emergency Use Authorization (EUA). This EUA will remain in effect (meaning this test can be used) for the duration of the COVID-19 declaration under Section 564(b)(1) of the Act, 21 U.S.C. section 360bbb-3(b)(1), unless the authorization is terminated or revoked.  Performed at [facility], [address], [place], [place] [address]   CBC with Differential     Status: Abnormal   Collection Time: 05/25/23 12:15 PM  Result Value Ref Range   WBC 12.4 (H) 4.0 - 10.5 K/uL   RBC 3.50 (L) 4.22 - 5.81 MIL/uL   Hemoglobin 10.0 (L) 13.0 - 17.0 g/dL   HCT 60.4 (L) 54.0 - 98.1 %   MCV 97.4 80.0 - 100.0 fL   MCH 28.6 26.0 - 34.0 pg   MCHC 29.3 (L) 30.0 - 36.0 g/dL   RDW 19.1 (H) 47.8 - 29.5 %   Platelets 216 150 - 400 K/uL   nRBC 0.2 0.0 - 0.2 %   Neutrophils Relative % 88 %   Neutro Abs 10.9 (H) 1.7 - 7.7 K/uL   Lymphocytes  Relative 3 %   Lymphs Abs 0.3 (L) 0.7 - 4.0 K/uL   Monocytes Relative 7 %   Monocytes Absolute 0.9 0.1 - 1.0 K/uL   Eosinophils Relative 0 %   Eosinophils Absolute 0.0 0.0 - 0.5 K/uL   Basophils Relative 0 %   Basophils Absolute 0.1 0.0 - 0.1 K/uL   Immature Granulocytes 2 %   Abs Immature Granulocytes 0.18 (H) 0.00 - 0.07 K/uL    Comment: Performed at [facility], [address], [place], [place] [address]  Comprehensive metabolic panel     Status: Abnormal   Collection Time: 05/25/23 12:15 PM  Result Value Ref Range   Sodium 137 135 - 145 mmol/L   Potassium 7.0 (HH) 3.5 - 5.1 mmol/L    Comment: CRITICAL RESULT CALLED TO, READ BACK BY AND VERIFIED WITH VALDEZ V, RN 1346 05/25/2023 SANDOVAL K   Chloride 100 98 - 111 mmol/L   CO2 21 (L) 22 - 32 mmol/L   Glucose, Bld 156 (H) 70 - 99 mg/dL    Comment: Glucose reference range applies only to samples taken after fasting for at least 8 hours.   BUN 51 (H) 8 - 23 mg/dL   Creatinine, Ser 8.65 (H) 0.61 - 1.24 mg/dL   Calcium 9.0 8.9 - 78.4 mg/dL   Total Protein 6.4 (L) 6.5 - 8.1 g/dL   Albumin 3.0 (L) 3.5 - 5.0 g/dL   AST 696 (H) 15 - 41 U/L   ALT 82 (H) 0 - 44 U/L   Alkaline Phosphatase 178 (H) 38 - 126 U/L   Total Bilirubin 1.6 (H) 0.3 - 1.2 mg/dL   GFR, Estimated 14 (L) >60 mL/min    Comment: (NOTE) Calculated using the CKD-EPI Creatinine Equation (2021)    Anion gap 16 (H) 5 - 15    Comment: Performed at [facility], [address], [place], [place] [address]  Lipase, blood     Status: None   Collection Time: 05/25/23 12:15 PM  Result Value Ref Range   Lipase 23 11 - 51 U/L    Comment: Performed at [facility], [address] [place], [place] [address]  Protime-INR     Status: Abnormal   Collection Time: 05/25/23 12:15 PM  Result Value Ref Range   Prothrombin Time 29.6 (H) 11.4 - 15.2 seconds   INR 2.8 (H) 0.8 -  1.2    Comment: (NOTE) INR goal varies based on device and disease states. Performed at  [facility], [address], [place], [place] [address]   Brain natriuretic peptide     Status: Abnormal   Collection Time: 05/25/23 12:15 PM  Result Value Ref Range   B Natriuretic Peptide 1,971.8 (H) 0.0 - 100.0 pg/mL    Comment: Performed at [facility], [address] [place], [place] [address]  Troponin I (High Sensitivity)     Status: Abnormal   Collection Time: 05/25/23 12:15 PM  Result Value Ref Range   Troponin I (High Sensitivity) 72 (H) <18 ng/L    Comment: (NOTE) Elevated high sensitivity troponin I (hsTnI) values and significant  changes across serial measurements may suggest ACS but many other  chronic and acute conditions are known to elevate hsTnI results.  Refer to the "Links" section for chest pain algorithms and additional  guidance. Performed at [facility], [address], [place], [place] [address]   I-stat chem 8, ED (not at [facility], [facility] or [facility])     Status: Abnormal   Collection Time: 05/25/23 12:33 PM  Result Value Ref Range   Sodium 133 (L) 135 - 145 mmol/L   Potassium 6.7 (HH) 3.5 - 5.1 mmol/L   Chloride 103 98 - 111 mmol/L   BUN 57 (H) 8 - 23 mg/dL   Creatinine, Ser 4.25 (H) 0.61 - 1.24 mg/dL   Glucose, Bld 956 (H) 70 - 99 mg/dL    Comment: Glucose reference range applies only to samples taken after fasting for at least 8 hours.   Calcium, Ion 0.94 (L) 1.15 - 1.40 mmol/L   TCO2 20 (L) 22 - 32 mmol/L   Hemoglobin 11.9 (L) 13.0 - 17.0 g/dL   HCT 38.7 (L) 56.4 - 33.2 %   Comment NOTIFIED PHYSICIAN   I-Stat CG4 Lactic Acid     Status: Abnormal   Collection Time: 05/25/23 12:33 PM  Result Value Ref Range   Lactic Acid, Venous 4.5 (HH) 0.5 - 1.9 mmol/L   Comment NOTIFIED PHYSICIAN    *Note: Due to a large number of results and/or encounters for the requested time period, some results have not been displayed. A complete set of results can be found in Results Review.   DG Chest Portable 1 View  Result Date: 05/25/2023 CLINICAL DATA:   Weakness EXAM: PORTABLE CHEST 1 VIEW COMPARISON:  05/11/2023 and older FINDINGS: Underinflation. Status post median sternotomy. Prosthetic aortic valve. Right upper chest battery pack with defibrillator leads along the right side of the heart and coronary sinus. Heart is enlarged. Central vascular congestion identified. Subtle left midlung opacity also seen. The left-sided pigtail catheter is no longer identified. No new pneumothorax. IMPRESSION: Interval removal of the left-sided pigtail catheter. No pneumothorax identified. Electronically Signed   By: Karen Kays M.D.   On: 05/25/2023 14:46    Pending Labs Unresulted Labs (From admission, onward)     Start     Ordered   05/25/23 1800  Comprehensive metabolic panel  Once,   R        05/25/23 1456   05/25/23 1209  Blood culture (routine x 2)  BLOOD CULTURE X 2,   R (with STAT occurrences)      05/25/23 1208   05/25/23 1208  Urinalysis, Routine w reflex microscopic -Urine, Clean Catch  Once,   URGENT       Question:  Specimen Source  Answer:  Urine, Clean Catch   05/25/23 1208            Vitals/Pain Today's Vitals   05/25/23 1415 05/25/23 1500 05/25/23 1509 05/25/23 1515  BP: 92/73 (!) 79/53 (!) 86/71 (!) 84/58  Pulse:  83 85 86  Resp: (!) 0 (!) 4 (!) 0 10  Temp:      TempSrc:      SpO2:  98% 97% 98%  PainSc:        Isolation Precautions No active isolations  Medications Medications  0.9 %  sodium chloride infusion (has no administration in time range)  norepinephrine (LEVOPHED) 4mg  in (0.016 mg/mL) premix infusion (5 mcg/min Intravenous Rate/Dose Change 05/25/23 1249)  amiodarone (PACERONE) tablet 400 mg (has no administration in time range)  quiniDINE gluconate CR tablet 324 mg (has no administration in time range)  levothyroxine (SYNTHROID) tablet 100 mcg (has no administration in time range)  sodium chloride flush (NS) 0.9 % injection 3 mL (has no administration in time range)  sodium chloride flush (NS) 0.9 %  injection 3 mL (has no administration in time range)  0.9 %  sodium chloride infusion (has no administration in time range)  acetaminophen (TYLENOL) tablet 650 mg (has no administration in time range)  calcium gluconate 2 g/ 100 mL sodium chloride IVPB (2,000 mg Intravenous New Bag/Given 05/25/23 1413)  sodium zirconium cyclosilicate (LOKELMA) packet 10 g (10 g Oral Given 05/25/23 1341)  insulin aspart (novoLOG) injection 5 Units (5 Units Intravenous Given 05/25/23 1249)    And  dextrose 50 % solution 50 mL (50 mLs Intravenous Given 05/25/23 1250)  sodium bicarbonate injection 50 mEq (50 mEq Intravenous Given 05/25/23 1246)    Mobility walks with device     Focused Assessments Pulmonary Assessment Handoff:  Lung sounds:    rales      R Recommendations: See Admitting Provider Note  Report given to:   Additional Notes:

## 2023-05-25 NOTE — Assessment & Plan Note (Signed)
Bun/creat 45/3.37 05/24/23

## 2023-05-25 NOTE — Assessment & Plan Note (Signed)
generalized weakness, fluid overloaded appearance, 05/19/23 cardiology increase Torsemide/Kcl 80mg /40 meq am, 40mg /74meq pm, INR 2.5-3.5 05/19/23 Na 144, K 3.5, Bun 47, creat 2.74, wbc 8.5, Hgb 7.9, plt 140, neutrophils 79.5 ED for further evaluation and treatment.

## 2023-05-25 NOTE — ED Provider Notes (Addendum)
[place] [facility]MENT AT [facility] Provider Note   CSN: [id number] Arrival date & time: 05/25/23  1149     History  Chief Complaint  Patient presents with   Weakness    Austin [name] is a 82 y.o. male.  Patient here with generalized weakness.  Hypotensive, swelling on his legs.  Patient's been progressively getting short of breath over the last few days.  He has a history of heart failure with a pacemaker on Coumadin.  He has not felt well the last few days.  Weakness has gotten worse generally today.  He has a history of VTE following heart failure clinic.  He denies any chest pain.  He has low blood pressure at baseline takes midodrine.  He denies any cough or sputum production or abdominal pain or pain with urination.  He currently has a drain on the left side of his chest wall from chronic wound.  The history is provided by the patient.       Home Medications Prior to Admission medications   Medication Sig Start Date End Date Taking? Authorizing Provider  acetaminophen (TYLENOL) 500 MG tablet Take 1,000 mg by mouth every 8 (eight) hours as needed for headache, fever or mild pain.    [provider]  amiodarone (PACERONE) 400 MG tablet Take 400 mg by mouth daily.    [provider]  levothyroxine (SYNTHROID) 100 MCG tablet Take 1 tablet (100 mcg total) by mouth daily. 05/19/23   Venita Sheffield, MD  melatonin 5 MG TABS Take 5 mg by mouth at bedtime.    [provider]  midodrine (PROAMATINE) 5 MG tablet Take 1 tablet (5 mg total) by mouth 3 (three) times daily with meals. 05/17/23 06/16/23  Arrien, York Ram, MD  mirtazapine (REMERON) 7.5 MG tablet Take 1 tablet (7.5 mg total) by mouth at bedtime. 04/25/23   Venita Sheffield, MD  Multiple Vitamin (MULTIVITAMIN WITH MINERALS) TABS tablet Take 1 tablet by mouth in the morning.    [provider]  mupirocin ointment (BACTROBAN) 2 % Apply 1  Application topically daily. 03/01/23   McDonald, Rachelle Hora, DPM  nitroGLYCERIN (NITROSTAT) 0.4 MG SL tablet Place 1 tablet (0.4 mg total) under the tongue every 5 (five) minutes x 3 doses as needed for chest pain. 10/14/18   Abelino Derrick, PA-C  potassium chloride SA (KLOR-CON M) 20 MEQ tablet Take 2 tablets (40 mEq total) by mouth every morning AND 1 tablet (20 mEq total) every evening. 05/19/23   Laurey Morale, MD  quiniDINE gluconate 324 MG CR tablet TAKE 1 TABLET BY MOUTH 2 TIMES DAILY. 01/25/23   Laurey Morale, MD  Torsemide 40 MG TABS Take 80 mg by mouth every morning AND 40 mg every evening. 05/19/23   Laurey Morale, MD  warfarin (COUMADIN) 2.5 MG tablet Take 1-2 tablets Daily or as prescribed by Coumadin Clinic. Patient not taking: Reported on 05/19/2023 05/06/23   Laurey Morale, MD      Allergies    Patient has no known allergies.    Review of Systems   Review of Systems  Physical Exam Updated Vital Signs  ED Triage Vitals  Encounter Vitals Group     BP 05/25/23 1215 (!) 79/61     Systolic BP Percentile --      Diastolic BP Percentile --      Pulse Rate 05/25/23 1215 84     Resp 05/25/23 1200 (!) 23  Temp --      Temp src --      SpO2 05/25/23 1215 97 %     Weight --      Height --      Head Circumference --      Peak Flow --      Pain Score 05/25/23 1152 0     Pain Loc --      Pain Education --      Exclude from Growth Chart --      Physical Exam Vitals and nursing note reviewed.  Constitutional:      General: He is not in acute distress.    Appearance: He is well-developed. He is not ill-appearing.  HENT:     Head: Normocephalic and atraumatic.     Nose: Nose normal.     Mouth/Throat:     Mouth: Mucous membranes are moist.  Eyes:     Extraocular Movements: Extraocular movements intact.     Conjunctiva/sclera: Conjunctivae normal.     Pupils: Pupils are equal, round, and reactive to light.  Cardiovascular:     Rate and Rhythm: Normal rate and  regular rhythm.     Pulses: Normal pulses.     Heart sounds: Normal heart sounds. No murmur heard. Pulmonary:     Comments: Coarse breath sounds throughout, mildly increased work of breathing Abdominal:     Palpations: Abdomen is soft.     Tenderness: There is no abdominal tenderness.  Musculoskeletal:        General: No swelling. Normal range of motion.     Cervical back: Neck supple.     Right lower leg: Edema present.     Left lower leg: Edema present.  Skin:    General: Skin is warm and dry.     Capillary Refill: Capillary refill takes less than 2 seconds.     Findings: Bruising present.  Neurological:     General: No focal deficit present.     Mental Status: He is alert and oriented to person, place, and time.     Sensory: No sensory deficit.     Motor: No weakness.  Psychiatric:        Mood and Affect: Mood normal.     ED Results / Procedures / Treatments   Labs (all labs ordered are listed, but only abnormal results are displayed) Labs Reviewed  I-STAT CHEM 8, ED - Abnormal; Notable for the following components:      Result Value   Sodium 133 (*)    Potassium 6.7 (*)    BUN 57 (*)    Creatinine, Ser 4.30 (*)    Glucose, Bld 154 (*)    Calcium, Ion 0.94 (*)    TCO2 20 (*)    Hemoglobin 11.9 (*)    HCT 35.0 (*)    All other components within normal limits  I-STAT CG4 LACTIC ACID, ED - Abnormal; Notable for the following components:   Lactic Acid, Venous 4.5 (*)    All other components within normal limits  RESP PANEL BY RT-PCR (RSV, FLU A&B, COVID)  [id number]  CULTURE, BLOOD (ROUTINE X 2)  CULTURE, BLOOD (ROUTINE X 2)  CBC WITH DIFFERENTIAL/PLATELET  COMPREHENSIVE METABOLIC PANEL  LIPASE, BLOOD  PROTIME-INR  BRAIN NATRIURETIC PEPTIDE  URINALYSIS, ROUTINE W REFLEX MICROSCOPIC  TROPONIN I (HIGH SENSITIVITY)    EKG None  Radiology No results found.  Procedures .Critical Care  Performed by: Virgina Norfolk, DO Authorized by: Virgina Norfolk, DO    Critical care  provider statement:    Critical care time (minutes):  35   Critical care was necessary to treat or prevent imminent or life-threatening deterioration of the following conditions:  Cardiac failure   Critical care was time spent personally by me on the following activities:  Development of treatment plan with patient or surrogate, blood draw for specimens, discussions with consultants, examination of patient, evaluation of patient's response to treatment, obtaining history from patient or surrogate, ordering and performing treatments and interventions, ordering and review of laboratory studies, ordering and review of radiographic studies, pulse oximetry, re-evaluation of patient's condition and review of old charts   I assumed direction of critical care for this patient from another provider in my specialty: no       Medications Ordered in ED Medications  midodrine (PROAMATINE) tablet 5 mg (has no administration in time range)  0.9 %  sodium chloride infusion (has no administration in time range)  norepinephrine (LEVOPHED) 4mg  in (0.016 mg/mL) premix infusion (5 mcg/min Intravenous Rate/Dose Change 05/25/23 1249)  calcium gluconate 2 g/ 100 mL sodium chloride IVPB (has no administration in time range)  sodium zirconium cyclosilicate (LOKELMA) packet 10 g (has no administration in time range)  insulin aspart (novoLOG) injection 5 Units (5 Units Intravenous Given 05/25/23 1249)    And  dextrose 50 % solution 50 mL (50 mLs Intravenous Given 05/25/23 1250)  sodium bicarbonate injection 50 mEq (50 mEq Intravenous Given 05/25/23 1246)    ED Course/ Medical Decision Making/ A&P                                 Medical Decision Making Amount and/or Complexity of Data Reviewed Labs: ordered. Radiology: ordered.  Risk OTC drugs. Prescription drug management. Decision regarding hospitalization.   Shea Evans overall patient is here with generalized weakness.  He  arrives hypotensive, short of breath on chronic oxygen.  He is on 2 L.  He is followed closely with heart failure clinic.  Seems like he is volume overloaded on exam.  He has end-stage heart failure.  He has a pacemaker.  He has left-sided chest drain in from a wound there.  He does not have a fever.  I do not think that this is sepsis.  He looks like he is in cardiogenic shock.  I have talked with the cardiology heart failure team and they will admit but blood work is still pending.  I-STAT Chem-8 does show a potassium of 6.7.  He had EKG that showed wide rhythm that looks like it is being paced but will treat for hyperkalemia with Lokelma, insulin and dextrose and calcium.  Will give him his midodrine.  Sounds like he is on may be palliative care at this time but will defer further aggressive management for his heart failure team.  Sounds like he is DNR DNI.  Not sure if you would be interested in vasopressors.  Globin is 11.9.  Creatinine 4.3 which is a little bit above his baseline.  Sodium is 133.  Lactic acid is 4.5.  This x-ray per my review interpretation looks like volume overload.  Overall I do think that this is heart failure related, end-stage.  Seems less likely to be infectious process but blood cultures have been drawn.  Cardiology team to admit for further care.  We will not do any IV fluids as I do not think that this is sepsis.  However, cardiology team  to start patient on Levophed.  This chart was dictated using voice recognition software.  Despite best efforts to proofread,  errors can occur which can change the documentation meaning.          Final Clinical Impression(s) / ED Diagnoses Final diagnoses:  Cardiogenic shock (HCC)  Hyperkalemia    Rx / DC Orders ED Discharge Orders     None         Virgina Norfolk, DO 05/25/23 1250    Virgina Norfolk, DO 05/25/23 1300

## 2023-05-25 NOTE — ED Notes (Signed)
Got patient into a gown on the monitor did EKG shown to Dr Carmie End patient is resting with call bell in reach

## 2023-05-25 NOTE — Assessment & Plan Note (Signed)
Heart rate is in control, taking Quinidine, Amiodarone

## 2023-05-26 DIAGNOSIS — R570 Cardiogenic shock: Secondary | ICD-10-CM | POA: Diagnosis not present

## 2023-05-26 DIAGNOSIS — R579 Shock, unspecified: Secondary | ICD-10-CM | POA: Diagnosis not present

## 2023-05-26 DIAGNOSIS — Z515 Encounter for palliative care: Secondary | ICD-10-CM | POA: Diagnosis not present

## 2023-05-26 DIAGNOSIS — E875 Hyperkalemia: Secondary | ICD-10-CM | POA: Diagnosis not present

## 2023-05-26 LAB — GLUCOSE, CAPILLARY: Glucose-Capillary: 89 mg/dL (ref 70–99)

## 2023-05-26 MED ORDER — ACETAMINOPHEN 650 MG RE SUPP: 650.0000 mg | Freq: Four times a day (QID) | RECTAL | Status: DC | PRN

## 2023-05-26 MED ORDER — MORPHINE SULFATE (PF) 2 MG/ML IV SOLN
1.0000 mg | INTRAVENOUS | Status: DC | PRN
  Administered 2023-05-26: 2 mg via INTRAVENOUS
  Filled 2023-05-26: qty 1

## 2023-05-26 MED ORDER — HALOPERIDOL LACTATE 5 MG/ML IJ SOLN: 0.5000 mg | INTRAMUSCULAR | Status: DC | PRN

## 2023-05-26 MED ORDER — HALOPERIDOL LACTATE 2 MG/ML PO CONC: 0.5000 mg | ORAL | Status: DC | PRN

## 2023-05-26 MED ORDER — GLYCOPYRROLATE 0.2 MG/ML IJ SOLN: 0.2000 mg | INTRAMUSCULAR | Status: DC | PRN

## 2023-05-26 MED ORDER — GLYCOPYRROLATE 1 MG PO TABS: 1.0000 mg | ORAL_TABLET | ORAL | Status: DC | PRN

## 2023-05-26 MED ORDER — ACETAMINOPHEN 325 MG PO TABS: 650.0000 mg | ORAL_TABLET | Freq: Four times a day (QID) | ORAL | Status: DC | PRN

## 2023-05-26 MED ORDER — LORAZEPAM 2 MG/ML IJ SOLN
0.5000 mg | INTRAMUSCULAR | Status: DC | PRN
  Administered 2023-05-26: 0.5 mg via INTRAVENOUS
  Filled 2023-05-26: qty 1

## 2023-05-26 MED ORDER — ONDANSETRON HCL 4 MG/2ML IJ SOLN: 4.0000 mg | Freq: Four times a day (QID) | INTRAMUSCULAR | Status: DC | PRN

## 2023-05-26 MED ORDER — HALOPERIDOL 0.5 MG PO TABS: 0.5000 mg | ORAL_TABLET | ORAL | Status: DC | PRN

## 2023-05-26 MED ORDER — LORAZEPAM 2 MG/ML IJ SOLN: 0.5000 mg | INTRAMUSCULAR | Status: DC | PRN

## 2023-05-26 MED ORDER — ONDANSETRON 4 MG PO TBDP: 4.0000 mg | ORAL_TABLET | Freq: Four times a day (QID) | ORAL | Status: DC | PRN

## 2023-05-26 MED ORDER — BIOTENE DRY MOUTH MT LIQD: 15.0000 mL | OROMUCOSAL | Status: DC | PRN

## 2023-05-26 MED ORDER — POLYVINYL ALCOHOL 1.4 % OP SOLN: 1.0000 [drp] | Freq: Four times a day (QID) | OPHTHALMIC | Status: DC | PRN

## 2023-05-26 MED ORDER — GLYCOPYRROLATE NICU IV SYRINGE 0.2 MG/ML: 4.0000 ug/kg | Freq: Three times a day (TID) | INTRAMUSCULAR | Status: DC | PRN

## 2023-05-26 MED ORDER — GLYCOPYRROLATE 0.2 MG/ML IJ SOLN
0.2000 mg | INTRAMUSCULAR | Status: DC | PRN
  Administered 2023-05-26 (×2): 0.2 mg via INTRAVENOUS
  Filled 2023-05-26 (×2): qty 1

## 2023-05-27 NOTE — Progress Notes (Signed)
Daily Progress Note   Patient Name: Austin [name]       Date: 05/18/2023 DOB: July 13, 1941  Age: 82 y.o. MRN#: [id number] Attending Physician: Dorthula Nettles, DO Primary Care Physician: Laurey Morale, MD Admit Date: 06-22-2023  Reason for Consultation/Follow-up: Non pain symptom management, Pain control, Psychosocial/spiritual support, and Terminal Care  Subjective: I have reviewed medical records including EPIC notes, MAR, and labs. Noted patient transitioned to full comfort measures overnight after wife and daughter arrived at bedside. Received report from primary RN - no acute concerns.  Went to visit patient at bedside - no family/visitors present. Patient is lying in bed unresponsive with agonal breathing. No signs or non-verbal gestures of pain or discomfort noted. No respiratory distress, increased work of breathing, or secretions noted. [employer] staff at bedside and indicates ICD has just been deactivated. Agonal heart rhythm noted on monitor.   9:23 AM Called daughter/Ashley - she requested to call back in 5 minutes.  9:37 AM Morrie Sheldon returned phone call. PMT introduced. Emotional support provided. Prognosis reviewed to be likely minutes to hours at most. Therapeutic listening provided as she reflects on traveling from [place] last night and patient's decision for comfort care. Morrie Sheldon explains that patient's wife/her mother/Sue has dementia and she is having a difficult time navigating this situation. Morrie Sheldon is with her mother now and will be headed to the hospital soon.   All questions and concerns addressed. Encouraged to call with questions and/or concerns. PMT card texted to [facility] via [employer] per her request.  Length of Stay: 1  Current Medications: Scheduled Meds:    sodium chloride flush  3 mL Intravenous Q12H    Continuous Infusions:  sodium chloride      PRN Meds: sodium chloride, acetaminophen **OR** acetaminophen, antiseptic oral rinse, glycopyrrolate **OR** glycopyrrolate **OR** glycopyrrolate, haloperidol **OR** haloperidol **OR** haloperidol lactate, LORazepam, morphine injection, ondansetron **OR** ondansetron (ZOFRAN) IV, polyvinyl alcohol, sodium chloride flush  Physical Exam Vitals and nursing note reviewed.  Constitutional:      General: He is not in acute distress.    Appearance: He is ill-appearing.  Pulmonary:     Comments: Agonal respirations  Skin:    General: Skin is warm and dry.  Neurological:     Mental Status: He is unresponsive.  Vital Signs: BP (!) 67/54   Pulse 88   Temp (!) 96.4 F (35.8 C) (Axillary)   Resp 16   Wt 87 kg   SpO2 (!) 36%   BMI 25.30 kg/m  SpO2: SpO2: (!) 36 % O2 Device: O2 Device: Room Air O2 Flow Rate:    Intake/output summary:  Intake/Output Summary (Last 24 hours) at 05/03/2023 0917 Last data filed at 04/28/2023 0200 Gross per 24 hour  Intake 475.21 ml  Output --  Net 475.21 ml   LBM:   Baseline Weight: Weight: 87 kg Most recent weight: Weight: 87 kg       Palliative Assessment/Data: PPS 10%      Patient Active Problem List   Diagnosis Date Noted   Shock (HCC) 05-29-2023   Cardiogenic shock (HCC) 29-May-2023   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) 05/12/2023   Hyperkalemia 05/12/2023   Fatigue 05/12/2023   Acute on chronic diastolic heart failure (HCC) 05/11/2023   Postoperative seroma involving circulatory system after other circulatory system procedure 05/01/2023   Hyponatremia 04/27/2023   Insomnia 04/27/2023   Heart failure (HCC) 04/06/2023   Postprocedural seroma of skin and subcutaneous tissue following other procedure 03/23/2023   Callous ulcer, limited to breakdown of skin (HCC) 09/27/2022   OSA on CPAP  09/27/2022   Dyspnea 07/05/2022   Anemia due to zinc deficiency 05/25/2022   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   Chronic kidney disease, stage IV (severe) (HCC) 05/17/2022   Anemia of chronic disease 05/17/2022   Need for immunization against influenza 05/17/2022   Chronic anticoagulation 10/14/2018   Ventricular tachycardia (HCC) 10/12/2018   Neuropathy 08/25/2018   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   Nonischemic cardiomyopathy (HCC)    Supratherapeutic INR 01/10/2016   S/P ablation of ventricular arrhythmia 01/10/2016   Acquired hypothyroidism 10/27/2015   H/O mitral valve replacement with mechanical valve    Anticoagulation goal of INR 2.5 to 3.5 08/20/2013   Cardiac device, implant, or graft infection or inflammation (HCC) 06/28/2011   ICD (implantable cardioverter-defibrillator) in place 06/02/2009   Acute on chronic systolic CHF (congestive heart failure) (HCC) 04/15/2009   ERECTILE DYSFUNCTION 11/27/2008   HEARING LOSS 11/27/2008   Atrial fibrillation, chronic (HCC) 11/27/2008   SLEEP APNEA 11/27/2008    Palliative Care Assessment & Plan   Patient Profile: 82 y.o. male  with past medical history of mechanical mitral valve s/p MV endocarditis, VT, nonischemic cardiomyopathy, complete heart block, CHF, [employer] CRT-D device implanted, afib presented to [facility] ED on 2023-05-29 from [facility] with complaints of progressively worsening shortness of breath and weakness for several days. He was admitted on 29-May-2023 with cardiogenic shock, hyperkalemia, and elevated LFTs.   Assessment: Principal Problem:   Shock (HCC) Active Problems:   Cardiogenic shock (HCC)   Terminal care  Recommendations/Plan: Continue full comfort measures Continue DNR/DNI as previously documented Anticipate hospital death - likely minutes to hours at most. Family called and they are on their way to the hospital  ICD has been  deactivated Continue current comfort focused medication regimen - no changes Orders adjusted to reflect full comfort measures PMT will continue to follow and support holistically  Symptom Management Morphine PRN pain/dyspnea/increased work of breathing/RR>25 Tylenol PRN pain/fever Biotin PRN dry mouth Robinul PRN secretions Haldol PRN agitation/delirium Ativan PRN anxiety/seizure/sleep/distress Zofran PRN nausea/vomiting Liquifilm Tears PRN dry eye   Goals of Care and Additional Recommendations: Limitations on Scope of Treatment: Full Comfort  Care  Code Status:    Code Status Orders  (From admission, onward)           Start     Ordered   04/26/2023 0024  Do not attempt resuscitation (DNR) - Comfort care  Continuous       Question Answer Comment  If patient has no pulse and is not breathing Do Not Attempt Resuscitation   In Pre-Arrest Conditions (Patient Is Breathing and Has a Pulse) Provide comfort measures. Relieve any mechanical airway obstruction. Avoid transfer unless required for comfort.   Consent: Discussion documented in EHR or advanced directives reviewed      05/11/2023 0033           Code Status History     Date Active Date Inactive Code Status Order ID Comments User Context   June 09, 2023 1455 05/19/2023 0033 Limited: Do not attempt resuscitation (DNR) -DNR-LIMITED -Do Not Intubate/DNI  [id number]  Andrey Farmer, PA-C ED   09-Jun-2023 1245 06/09/2023 1455 Limited: Do not attempt resuscitation (DNR) -DNR-LIMITED -Do Not Intubate/DNI  [id number]  Andrey Farmer, PA-C ED   05/11/2023 2218 05/18/2023 2028 Full Code [id number]  Howerter, Chaney Born, DO ED   04/29/2023 2027 05/02/2023 2004 Full Code [id number]  Gery Pray, MD ED   04/06/2023 1134 04/23/2023 0306 Full Code [id number]  Graciella Freer, PA-C ED   03/23/2023 1506 04/01/2023 2103 Full Code [id number]  Synetta Fail, MD ED   02/26/2021 1354 02/26/2021 2033 Full Code [id number]  Laurey Morale, MD Inpatient   10/12/2018 1400 10/14/2018 1439 Full Code [id number]  Sheilah Pigeon, PA-C ED   05/04/2017 1039 05/04/2017 1609 Full Code [id number]  Marinus Maw, MD Inpatient   10/27/2015 0244 10/29/2015 1639 Full Code [id number]  Julaine Hua, MD Inpatient   10/19/2015 1844 10/22/2015 1343 Full Code [id number]  Chilton Si, MD ED   08/22/2015 1539 08/25/2015 1721 Full Code [id number]  Marily Lente, RN ED       Prognosis:  Minutes to hours  Discharge Planning: Anticipated Hospital Death  Care plan was discussed with primary RN, patient's daughter  Thank you for allowing the Palliative Medicine Team to assist in the care of this patient.     Haskel Khan, NP  Please contact Palliative Medicine Team phone at [phone] for questions and concerns.   *Portions of this note are a verbal dictation therefore any spelling and/or grammatical errors are due to the "[employer] One" system interpretation.

## 2023-05-27 NOTE — Progress Notes (Signed)
10:10  Pt. Unresponsive to verbal /physical stimuli, no respiratory effort, absent heart sounds, pupils fixed & dilated, pronounced dead at 10:10 am. This RN was with patient . Family notified around 0920 and are on their way.

## 2023-05-27 NOTE — Progress Notes (Signed)
eLink Physician-Brief Progress Note Patient Name: Austin [name] DOB: 1941-04-28 MRN: [id number]   Date of Service    HPI/Events of Note  Patient's wife and daughter arrived and are in the room. I confirmed from the patient, his daughter and wife that the plan is to transition to full comfort measures.  eICU Interventions  Full comfort measures ordered.        Thomasene Lot Jerianne Anselmo , 12:27 AM

## 2023-05-27 NOTE — Death Summary Note (Signed)
Advanced Heart Failure Death Summary  Death Summary   Patient ID: Cleaveland Sega MRN: [id number], DOB/AGE: [birth year] 82 y.o. Admit date: Jun 15, 2023 D/C date:     05/07/2023   Primary Discharge Diagnoses:  Shock  Lactic Acidosis Hyperkalemia  Acute Hypoxic Respiratory Failure  AKI DNR  Hospital Course:   Mr. 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   Over the past several months Mr. Ladona Ridgel has had multiple admissions with a slow decline in functional status.  He was NYHA III-IV at baseline with refractory heart failure. Presented to ED with acute dyspnea and hypotension. Initial labs with lactic acidosis, hyperkalemia, and worsening renal function. Started on norepinephrine and hyperkalemia was treated. Family contacted and agreed with DNR/DNI.  Plan was to continue pressor until his daughter arrived from [place]. Palliative Care consulted. Once admitted to the ICU he continued to decline and developed bradycardia. After family arrived, comfort orders placed and Norepi was stopped.      He passed 05/03/2023 at 10:10.   Duration of Discharge Encounter: Greater than 35 minutes   Signed, Tonye Becket, NP 05/16/2023, 11:37 AM

## 2023-05-27 NOTE — Progress Notes (Addendum)
Advanced Heart Failure Rounding Note  PCP-Cardiologist: None   Subjective:    Transitioned to comfort care. Over night given Robinol and Ativan.    Objective:   Weight Range: 87 kg Body mass index is 25.3 kg/m.   Vital Signs:   Temp:  [96.4 F (35.8 C)-97.4 F (36.3 C)] 96.4 F (35.8 C) (10/30 2335) Pulse Rate:  [59-107] 88 (10/31 0500) Resp:  [0-30] 18 (10/31 0500) BP: (67-122)/(44-76) 81/72 (10/30 2320) SpO2:  [36 %-100 %] 36 % (10/31 0500) Weight:  [87 kg-87.6 kg] 87 kg (10/30 1700)    Weight change: Filed Weights   05/25/23 1700  Weight: 87 kg    Intake/Output:   Intake/Output Summary (Last 24 hours) at  6045 Last data filed at  0200 Gross per 24 hour  Intake 475.21 ml  Output --  Net 475.21 ml      Physical Exam    General:  Elderly. Sedated  HEENT: Normal Neck: Supple. JVP to jaw . Carotids 2+ bilat; no bruits. No lymphadenopathy or thyromegaly appreciated. Cor: PMI nondisplaced. Regular rate & rhythm. No rubs, gallops or murmurs. Magnet in place.  Lungs: Clear Abdomen: Soft, nontender, nondistended. No hepatosplenomegaly. No bruits or masses. Good bowel sounds. Extremities: No cyanosis, clubbing, rash, Cool R and LLE 2+edema Neuro: Sedated   Telemetry   N/A  EKG    N/A  Labs    CBC Recent Labs    05/25/23 1215 05/25/23 1233  WBC 12.4*  --   NEUTROABS 10.9*  --   HGB 10.0* 11.9*  HCT 34.1* 35.0*  MCV 97.4  --   PLT 216  --    Basic Metabolic Panel Recent Labs    40/98/11 1215 05/25/23 1233 05/25/23 1604  NA 137 133* 138  K 7.0* 6.7* 6.8*  CL 100 103 100  CO2 21*  --  23  GLUCOSE 156* 154* 100*  BUN 51* 57* 53*  CREATININE 4.12* 4.30* 4.37*  CALCIUM 9.0  --  9.2   Liver Function Tests Recent Labs    05/25/23 1215 05/25/23 1604  AST 121* 169*  ALT 82* 115*  ALKPHOS 178* 168*  BILITOT 1.6* 1.9*  PROT 6.4* 5.9*  ALBUMIN 3.0* 2.8*   Recent Labs    05/25/23 1215  LIPASE 23   Cardiac  Enzymes No results for input(s): "CKTOTAL", "CKMB", "CKMBINDEX", "TROPONINI" in the last 72 hours.  BNP: BNP (last 3 results) Recent Labs    05/04/23 1712 05/11/23 1832 05/25/23 1215  BNP 1,064.3* 1,224.2* 1,971.8*    ProBNP (last 3 results) Recent Labs    12/16/22 0959  PROBNP 3,692*     D-Dimer No results for input(s): "DDIMER" in the last 72 hours. Hemoglobin A1C No results for input(s): "HGBA1C" in the last 72 hours. Fasting Lipid Panel No results for input(s): "CHOL", "HDL", "LDLCALC", "TRIG", "CHOLHDL", "LDLDIRECT" in the last 72 hours. Thyroid Function Tests No results for input(s): "TSH", "T4TOTAL", "T3FREE", "THYROIDAB" in the last 72 hours.  Invalid input(s): "FREET3"  Other results:   Imaging    DG Chest Portable 1 View  Result Date: 05/25/2023 CLINICAL DATA:  Weakness EXAM: PORTABLE CHEST 1 VIEW COMPARISON:  05/11/2023 and older FINDINGS: Underinflation. Status post median sternotomy. Prosthetic aortic valve. Right upper chest battery pack with defibrillator leads along the right side of the heart and coronary sinus. Heart is enlarged. Central vascular congestion identified. Subtle left midlung opacity also seen. The left-sided pigtail catheter is no longer identified. No new pneumothorax. IMPRESSION:  Interval removal of the left-sided pigtail catheter. No pneumothorax identified. Electronically Signed   By: Karen Kays M.D.   On: 05/25/2023 14:46     Medications:     Scheduled Medications:  sodium chloride flush  3 mL Intravenous Q12H    Infusions:  sodium chloride      PRN Medications: sodium chloride, acetaminophen **OR** acetaminophen, antiseptic oral rinse, glycopyrrolate **OR** glycopyrrolate **OR** glycopyrrolate, haloperidol **OR** haloperidol **OR** haloperidol lactate, LORazepam, morphine injection, ondansetron **OR** ondansetron (ZOFRAN) IV, polyvinyl alcohol, sodium chloride flush    Patient Profile   Mr. 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   Assessment/Plan  1. Cardiogenic Shock  Initially on Norepi drip. Transitioned to comfort care after discussion for family.  Comfort orders in place.  2. Hyperkalemia  3. AKI 4. Elevated LFTs.  5. DNR/DNI  Transitioned to comfort care. Palliative Care following. I called his daughter, Morrie Sheldon and provided an update.    Anticipate hospital death.   Length of Stay: 1  Jannatul Wojdyla, NP  , 8:22 AM  Advanced Heart Failure Team Pager [phone] (M-F; 7a - 5p)  Please contact [facility] for night-coverage after hours (5p -7a ) and weekends on [email or web address]

## 2023-05-27 DEATH — deceased

## 2023-05-29 ENCOUNTER — Other Ambulatory Visit (HOSPITAL_COMMUNITY): Payer: Self-pay | Admitting: Cardiology

## 2023-05-29 DIAGNOSIS — I472 Ventricular tachycardia, unspecified: Secondary | ICD-10-CM

## 2023-05-29 DIAGNOSIS — I5022 Chronic systolic (congestive) heart failure: Secondary | ICD-10-CM

## 2023-05-30 LAB — CULTURE, BLOOD (ROUTINE X 2)
Culture: NO GROWTH
Culture: NO GROWTH
Special Requests: ADEQUATE

## 2023-06-01 ENCOUNTER — Ambulatory Visit: Payer: Medicare Other | Admitting: Internal Medicine

## 2023-06-02 ENCOUNTER — Inpatient Hospital Stay: Payer: Medicare Other | Admitting: Internal Medicine

## 2023-06-16 ENCOUNTER — Encounter (HOSPITAL_COMMUNITY): Payer: Medicare Other | Admitting: Cardiology

## 2023-06-22 ENCOUNTER — Ambulatory Visit: Payer: Medicare Other

## 2023-06-26 ENCOUNTER — Other Ambulatory Visit: Payer: Self-pay | Admitting: Internal Medicine

## 2023-06-26 DIAGNOSIS — E032 Hypothyroidism due to medicaments and other exogenous substances: Secondary | ICD-10-CM

## 2023-09-21 ENCOUNTER — Ambulatory Visit: Payer: Medicare Other

## 2023-12-21 ENCOUNTER — Ambulatory Visit: Payer: Medicare Other

## 2024-01-30 ENCOUNTER — Other Ambulatory Visit (HOSPITAL_COMMUNITY): Payer: Self-pay

## 2024-03-13 ENCOUNTER — Telehealth: Payer: Medicare Other | Admitting: Adult Health

## 2024-03-21 ENCOUNTER — Ambulatory Visit: Payer: Medicare Other

## 2024-06-20 ENCOUNTER — Ambulatory Visit: Payer: Medicare Other
